# Patient Record
Sex: Female | Born: 1975 | Race: White | Hispanic: No | Marital: Single | Smoking: Former smoker
Health system: Southern US, Community
[De-identification: ages and names within clinical notes are randomized; demographics above are authoritative.]

## PROBLEM LIST (undated history)

## (undated) DIAGNOSIS — R112 Nausea with vomiting, unspecified: Secondary | ICD-10-CM

## (undated) DIAGNOSIS — E669 Obesity, unspecified: Secondary | ICD-10-CM

## (undated) DIAGNOSIS — Z9889 Other specified postprocedural states: Secondary | ICD-10-CM

## (undated) DIAGNOSIS — K219 Gastro-esophageal reflux disease without esophagitis: Secondary | ICD-10-CM

## (undated) DIAGNOSIS — E282 Polycystic ovarian syndrome: Secondary | ICD-10-CM

## (undated) DIAGNOSIS — M1990 Unspecified osteoarthritis, unspecified site: Secondary | ICD-10-CM

## (undated) DIAGNOSIS — C50919 Malignant neoplasm of unspecified site of unspecified female breast: Secondary | ICD-10-CM

## (undated) DIAGNOSIS — R569 Unspecified convulsions: Secondary | ICD-10-CM

## (undated) DIAGNOSIS — K649 Unspecified hemorrhoids: Secondary | ICD-10-CM

## (undated) DIAGNOSIS — G43909 Migraine, unspecified, not intractable, without status migrainosus: Secondary | ICD-10-CM

## (undated) DIAGNOSIS — C4492 Squamous cell carcinoma of skin, unspecified: Secondary | ICD-10-CM

## (undated) HISTORY — PX: COSMETIC SURGERY: SHX468

## (undated) HISTORY — PX: HEMORRHOID SURGERY: SHX153

## (undated) HISTORY — DX: Malignant neoplasm of unspecified site of unspecified female breast: C50.919

## (undated) HISTORY — PX: DILATION AND CURETTAGE OF UTERUS: SHX78

## (undated) HISTORY — DX: Squamous cell carcinoma of skin, unspecified: C44.92

---

## 2009-11-09 ENCOUNTER — Emergency Department (HOSPITAL_BASED_OUTPATIENT_CLINIC_OR_DEPARTMENT_OTHER): Admission: EM | Admit: 2009-11-09 | Discharge: 2009-11-09 | Payer: Self-pay | Admitting: Emergency Medicine

## 2010-05-21 ENCOUNTER — Emergency Department (HOSPITAL_BASED_OUTPATIENT_CLINIC_OR_DEPARTMENT_OTHER): Admission: EM | Admit: 2010-05-21 | Discharge: 2010-05-21 | Payer: Self-pay | Admitting: Emergency Medicine

## 2010-06-26 ENCOUNTER — Emergency Department (HOSPITAL_BASED_OUTPATIENT_CLINIC_OR_DEPARTMENT_OTHER)
Admission: EM | Admit: 2010-06-26 | Discharge: 2010-06-26 | Payer: Self-pay | Source: Home / Self Care | Admitting: Emergency Medicine

## 2010-07-23 ENCOUNTER — Emergency Department (HOSPITAL_BASED_OUTPATIENT_CLINIC_OR_DEPARTMENT_OTHER)
Admission: EM | Admit: 2010-07-23 | Discharge: 2010-07-23 | Payer: Self-pay | Source: Home / Self Care | Admitting: Emergency Medicine

## 2010-08-05 ENCOUNTER — Emergency Department (HOSPITAL_BASED_OUTPATIENT_CLINIC_OR_DEPARTMENT_OTHER)
Admission: EM | Admit: 2010-08-05 | Discharge: 2010-08-05 | Disposition: A | Discharge status: Home / Self Care | Payer: Medicaid Other | Attending: Emergency Medicine | Admitting: Emergency Medicine

## 2010-08-05 DIAGNOSIS — M545 Low back pain, unspecified: Secondary | ICD-10-CM | POA: Insufficient documentation

## 2010-08-05 DIAGNOSIS — F17200 Nicotine dependence, unspecified, uncomplicated: Secondary | ICD-10-CM | POA: Insufficient documentation

## 2010-08-05 LAB — URINE MICROSCOPIC-ADD ON

## 2010-08-05 LAB — URINALYSIS, ROUTINE W REFLEX MICROSCOPIC
Bilirubin Urine: NEGATIVE
Leukocytes, UA: NEGATIVE
Nitrite: NEGATIVE
Specific Gravity, Urine: 1.005 (ref 1.005–1.030)
Urine Glucose, Fasting: NEGATIVE mg/dL
pH: 6 (ref 5.0–8.0)

## 2010-08-05 LAB — PREGNANCY, URINE: Preg Test, Ur: NEGATIVE

## 2010-09-11 LAB — URINALYSIS, ROUTINE W REFLEX MICROSCOPIC
Bilirubin Urine: NEGATIVE
Glucose, UA: NEGATIVE mg/dL
Ketones, ur: NEGATIVE mg/dL
Protein, ur: NEGATIVE mg/dL
pH: 7.5 (ref 5.0–8.0)

## 2010-11-06 ENCOUNTER — Emergency Department (HOSPITAL_COMMUNITY)
Admission: EM | Admit: 2010-11-06 | Discharge: 2010-11-06 | Disposition: A | Discharge status: Home / Self Care | Payer: Medicaid Other | Attending: Emergency Medicine | Admitting: Emergency Medicine

## 2010-11-06 DIAGNOSIS — K0510 Chronic gingivitis, plaque induced: Secondary | ICD-10-CM | POA: Insufficient documentation

## 2010-11-06 DIAGNOSIS — K089 Disorder of teeth and supporting structures, unspecified: Secondary | ICD-10-CM | POA: Insufficient documentation

## 2011-01-03 ENCOUNTER — Emergency Department (HOSPITAL_BASED_OUTPATIENT_CLINIC_OR_DEPARTMENT_OTHER)
Admission: EM | Admit: 2011-01-03 | Discharge: 2011-01-03 | Disposition: A | Discharge status: Home / Self Care | Payer: Medicaid Other | Attending: Emergency Medicine | Admitting: Emergency Medicine

## 2011-01-03 ENCOUNTER — Encounter: Payer: Self-pay | Admitting: Emergency Medicine

## 2011-01-03 DIAGNOSIS — K029 Dental caries, unspecified: Secondary | ICD-10-CM | POA: Insufficient documentation

## 2011-01-03 DIAGNOSIS — F17200 Nicotine dependence, unspecified, uncomplicated: Secondary | ICD-10-CM | POA: Insufficient documentation

## 2011-01-03 HISTORY — DX: Unspecified osteoarthritis, unspecified site: M19.90

## 2011-01-03 MED ORDER — IBUPROFEN 600 MG PO TABS: 600.0000 mg | ORAL_TABLET | Freq: Four times a day (QID) | ORAL | Status: AC | PRN

## 2011-01-03 MED ORDER — HYDROCODONE-ACETAMINOPHEN 5-325 MG PO TABS: 2.0000 | ORAL_TABLET | ORAL | Status: AC | PRN

## 2011-01-03 MED ORDER — IBUPROFEN 800 MG PO TABS
800.0000 mg | ORAL_TABLET | Freq: Once | ORAL | Status: AC
  Administered 2011-01-03: 800 mg via ORAL
  Filled 2011-01-03: qty 1

## 2011-01-03 NOTE — ED Notes (Signed)
Pt c/o left lower tooth pain.

## 2011-01-03 NOTE — ED Provider Notes (Signed)
History     Chief Complaint  Patient presents with  . Dental Pain   Patient is a 35 y.o. female presenting with tooth pain. The history is provided by the patient.  Dental PainPrimary symptoms do not include mouth pain, dental injury, oral bleeding, fever or sore throat. The symptoms began 3 to 5 days ago. The symptoms are unchanged. The symptoms occur constantly.  Additional symptoms include: jaw pain. Additional symptoms do not include: gum swelling, facial swelling and trouble swallowing.    Past Medical History  Diagnosis Date  . Arthritis     No past surgical history on file.  No family history on file.  History  Substance Use Topics  . Smoking status: Current Everyday Smoker -- 10 years    Types: Cigarettes  . Smokeless tobacco: Not on file  . Alcohol Use: No    OB History    Grav Para Term Preterm Abortions TAB SAB Ect Mult Living                  Review of Systems  Constitutional: Negative for fever.  HENT: Negative for sore throat, facial swelling and trouble swallowing.   All other systems reviewed and are negative.    Physical Exam  BP 133/82  Pulse 76  Temp(Src) 99.3 F (37.4 C) (Oral)  Resp 18  SpO2 100%  Physical Exam  Nursing note and vitals reviewed. Constitutional: She is oriented to person, place, and time. She appears well-developed and well-nourished.  Non-toxic appearance.  HENT:  Head: Normocephalic and atraumatic.  Mouth/Throat: Abnormal dentition. Dental caries present.  Eyes: Conjunctivae are normal. Pupils are equal, round, and reactive to light.  Neck: Normal range of motion.  Cardiovascular: Normal rate.   Pulmonary/Chest: Effort normal.  Neurological: She is alert and oriented to person, place, and time.  Skin: Skin is warm and dry.  Psychiatric: She has a normal mood and affect.    ED Course  Procedures  MDM Pt given ibuprofen for pain, will f/u dentist      Toy Baker, MD 01/03/11 2243

## 2011-01-22 ENCOUNTER — Emergency Department (HOSPITAL_COMMUNITY)
Admission: EM | Admit: 2011-01-22 | Discharge: 2011-01-22 | Disposition: A | Discharge status: Home / Self Care | Payer: Medicaid Other | Attending: Emergency Medicine | Admitting: Emergency Medicine

## 2011-01-22 ENCOUNTER — Emergency Department (HOSPITAL_COMMUNITY): Payer: Medicaid Other

## 2011-01-22 DIAGNOSIS — M546 Pain in thoracic spine: Secondary | ICD-10-CM | POA: Insufficient documentation

## 2011-01-22 DIAGNOSIS — S20229A Contusion of unspecified back wall of thorax, initial encounter: Secondary | ICD-10-CM | POA: Insufficient documentation

## 2011-01-22 DIAGNOSIS — M545 Low back pain, unspecified: Secondary | ICD-10-CM | POA: Insufficient documentation

## 2011-01-22 DIAGNOSIS — M129 Arthropathy, unspecified: Secondary | ICD-10-CM | POA: Insufficient documentation

## 2011-01-22 DIAGNOSIS — E669 Obesity, unspecified: Secondary | ICD-10-CM | POA: Insufficient documentation

## 2011-01-22 DIAGNOSIS — IMO0002 Reserved for concepts with insufficient information to code with codable children: Secondary | ICD-10-CM | POA: Insufficient documentation

## 2011-01-22 DIAGNOSIS — W07XXXA Fall from chair, initial encounter: Secondary | ICD-10-CM | POA: Insufficient documentation

## 2011-04-10 ENCOUNTER — Emergency Department (HOSPITAL_COMMUNITY)
Admission: EM | Admit: 2011-04-10 | Discharge: 2011-04-11 | Disposition: A | Discharge status: Home / Self Care | Payer: Medicaid Other | Attending: Emergency Medicine | Admitting: Emergency Medicine

## 2011-04-10 ENCOUNTER — Emergency Department (HOSPITAL_COMMUNITY)
Admission: EM | Admit: 2011-04-10 | Discharge: 2011-04-10 | Discharge status: Against Medical Advice | Payer: Medicaid Other | Attending: Emergency Medicine | Admitting: Emergency Medicine

## 2011-04-10 DIAGNOSIS — R599 Enlarged lymph nodes, unspecified: Secondary | ICD-10-CM | POA: Insufficient documentation

## 2011-04-10 DIAGNOSIS — R51 Headache: Secondary | ICD-10-CM | POA: Insufficient documentation

## 2011-04-11 ENCOUNTER — Emergency Department (HOSPITAL_COMMUNITY): Payer: Medicaid Other

## 2011-04-11 ENCOUNTER — Encounter (HOSPITAL_COMMUNITY): Payer: Self-pay

## 2011-04-11 LAB — URINALYSIS, ROUTINE W REFLEX MICROSCOPIC
Bilirubin Urine: NEGATIVE
Hgb urine dipstick: NEGATIVE
Specific Gravity, Urine: 1.01 (ref 1.005–1.030)
Urobilinogen, UA: 0.2 mg/dL (ref 0.0–1.0)

## 2011-04-11 LAB — CBC
HCT: 41.9 % (ref 36.0–46.0)
MCH: 28.4 pg (ref 26.0–34.0)
MCHC: 33.4 g/dL (ref 30.0–36.0)
MCV: 85 fL (ref 78.0–100.0)
RDW: 13.7 % (ref 11.5–15.5)
WBC: 10.2 10*3/uL (ref 4.0–10.5)

## 2011-04-11 LAB — BASIC METABOLIC PANEL
BUN: 8 mg/dL (ref 6–23)
Calcium: 9.2 mg/dL (ref 8.4–10.5)
Chloride: 104 mEq/L (ref 96–112)
Creatinine, Ser: 0.76 mg/dL (ref 0.50–1.10)
GFR calc Af Amer: >90 mL/min (ref >90)

## 2011-04-11 LAB — POCT I-STAT, CHEM 8
BUN: 8 mg/dL (ref 6–23)
Calcium, Ion: 1.16 mmol/L (ref 1.12–1.32)
Chloride: 106 mEq/L (ref 96–112)
Creatinine, Ser: 0.9 mg/dL (ref 0.50–1.10)
Glucose, Bld: 103 mg/dL — ABNORMAL HIGH (ref 70–99)

## 2011-04-11 LAB — DIFFERENTIAL
Basophils Relative: 0 % (ref 0–1)
Lymphocytes Relative: 47 % — ABNORMAL HIGH (ref 12–46)
Monocytes Absolute: 0.7 10*3/uL (ref 0.1–1.0)
Monocytes Relative: 6 % (ref 3–12)
Neutro Abs: 4.3 10*3/uL (ref 1.7–7.7)

## 2011-04-23 ENCOUNTER — Emergency Department (HOSPITAL_COMMUNITY)
Admission: EM | Admit: 2011-04-23 | Discharge: 2011-04-23 | Disposition: A | Discharge status: Home / Self Care | Payer: Medicaid Other | Attending: Emergency Medicine | Admitting: Emergency Medicine

## 2011-04-23 DIAGNOSIS — K5289 Other specified noninfective gastroenteritis and colitis: Secondary | ICD-10-CM | POA: Insufficient documentation

## 2011-04-23 DIAGNOSIS — R10819 Abdominal tenderness, unspecified site: Secondary | ICD-10-CM | POA: Insufficient documentation

## 2011-04-23 DIAGNOSIS — E860 Dehydration: Secondary | ICD-10-CM | POA: Insufficient documentation

## 2011-04-23 DIAGNOSIS — R112 Nausea with vomiting, unspecified: Secondary | ICD-10-CM | POA: Insufficient documentation

## 2011-04-23 DIAGNOSIS — R51 Headache: Secondary | ICD-10-CM | POA: Insufficient documentation

## 2011-04-23 DIAGNOSIS — R509 Fever, unspecified: Secondary | ICD-10-CM | POA: Insufficient documentation

## 2011-04-23 DIAGNOSIS — R197 Diarrhea, unspecified: Secondary | ICD-10-CM | POA: Insufficient documentation

## 2011-04-23 LAB — URINALYSIS, ROUTINE W REFLEX MICROSCOPIC
Glucose, UA: NEGATIVE mg/dL
Hgb urine dipstick: NEGATIVE
Ketones, ur: NEGATIVE mg/dL
Protein, ur: NEGATIVE mg/dL

## 2011-04-23 LAB — HEPATIC FUNCTION PANEL
ALT: 15 U/L (ref 0–35)
Alkaline Phosphatase: 87 U/L (ref 39–117)
Bilirubin, Direct: <0.1 mg/dL (ref 0.0–0.3)

## 2011-04-23 LAB — BASIC METABOLIC PANEL
CO2: 20 mEq/L (ref 19–32)
Calcium: 8.6 mg/dL (ref 8.4–10.5)
Creatinine, Ser: 0.68 mg/dL (ref 0.50–1.10)

## 2011-04-23 LAB — CBC
HCT: 39.6 % (ref 36.0–46.0)
MCH: 28.9 pg (ref 26.0–34.0)
MCHC: 34.3 g/dL (ref 30.0–36.0)
MCV: 84.3 fL (ref 78.0–100.0)
RDW: 13.9 % (ref 11.5–15.5)

## 2011-04-23 LAB — DIFFERENTIAL
Eosinophils Relative: 0 % (ref 0–5)
Lymphocytes Relative: 7 % — ABNORMAL LOW (ref 12–46)
Lymphs Abs: 0.9 10*3/uL (ref 0.7–4.0)
Monocytes Absolute: 0.6 10*3/uL (ref 0.1–1.0)

## 2011-05-09 ENCOUNTER — Encounter (HOSPITAL_COMMUNITY): Payer: Self-pay | Admitting: Emergency Medicine

## 2011-05-09 ENCOUNTER — Emergency Department (HOSPITAL_COMMUNITY): Payer: Medicaid Other

## 2011-05-09 ENCOUNTER — Emergency Department (HOSPITAL_COMMUNITY)
Admission: EM | Admit: 2011-05-09 | Discharge: 2011-05-09 | Disposition: A | Discharge status: Home / Self Care | Payer: Medicaid Other | Attending: Emergency Medicine | Admitting: Emergency Medicine

## 2011-05-09 DIAGNOSIS — G43909 Migraine, unspecified, not intractable, without status migrainosus: Secondary | ICD-10-CM | POA: Insufficient documentation

## 2011-05-09 DIAGNOSIS — N949 Unspecified condition associated with female genital organs and menstrual cycle: Secondary | ICD-10-CM | POA: Insufficient documentation

## 2011-05-09 DIAGNOSIS — R110 Nausea: Secondary | ICD-10-CM | POA: Insufficient documentation

## 2011-05-09 DIAGNOSIS — M129 Arthropathy, unspecified: Secondary | ICD-10-CM | POA: Insufficient documentation

## 2011-05-09 DIAGNOSIS — R109 Unspecified abdominal pain: Secondary | ICD-10-CM | POA: Insufficient documentation

## 2011-05-09 DIAGNOSIS — H53149 Visual discomfort, unspecified: Secondary | ICD-10-CM | POA: Insufficient documentation

## 2011-05-09 DIAGNOSIS — N898 Other specified noninflammatory disorders of vagina: Secondary | ICD-10-CM | POA: Insufficient documentation

## 2011-05-09 DIAGNOSIS — R102 Pelvic and perineal pain: Secondary | ICD-10-CM

## 2011-05-09 HISTORY — DX: Migraine, unspecified, not intractable, without status migrainosus: G43.909

## 2011-05-09 LAB — CBC
MCH: 28.3 pg (ref 26.0–34.0)
MCHC: 33.4 g/dL (ref 30.0–36.0)
MCV: 84.7 fL (ref 78.0–100.0)
Platelets: 294 10*3/uL (ref 150–400)
RDW: 13.7 % (ref 11.5–15.5)

## 2011-05-09 LAB — URINALYSIS, ROUTINE W REFLEX MICROSCOPIC
Hgb urine dipstick: NEGATIVE
Nitrite: NEGATIVE
Specific Gravity, Urine: 1.023 (ref 1.005–1.030)
Urobilinogen, UA: 1 mg/dL (ref 0.0–1.0)
pH: 5.5 (ref 5.0–8.0)

## 2011-05-09 LAB — DIFFERENTIAL
Basophils Relative: 0 % (ref 0–1)
Eosinophils Absolute: 0.2 10*3/uL (ref 0.0–0.7)
Eosinophils Relative: 3 % (ref 0–5)
Lymphs Abs: 3.5 10*3/uL (ref 0.7–4.0)
Neutrophils Relative %: 55 % (ref 43–77)

## 2011-05-09 LAB — COMPREHENSIVE METABOLIC PANEL
ALT: 14 U/L (ref 0–35)
Albumin: 3.6 g/dL (ref 3.5–5.2)
Alkaline Phosphatase: 103 U/L (ref 39–117)
Calcium: 9.3 mg/dL (ref 8.4–10.5)
GFR calc Af Amer: >90 mL/min (ref >90)
Glucose, Bld: 88 mg/dL (ref 70–99)
Potassium: 3.8 mEq/L (ref 3.5–5.1)
Sodium: 136 mEq/L (ref 135–145)
Total Protein: 7.2 g/dL (ref 6.0–8.3)

## 2011-05-09 LAB — LIPASE, BLOOD: Lipase: 25 U/L (ref 11–59)

## 2011-05-09 LAB — WET PREP, GENITAL: Yeast Wet Prep HPF POC: NONE SEEN

## 2011-05-09 MED ORDER — LIDOCAINE HCL (PF) 1 % IJ SOLN
INTRAMUSCULAR | Status: AC
  Administered 2011-05-09: 5 mL
  Filled 2011-05-09: qty 5

## 2011-05-09 MED ORDER — DOXYCYCLINE HYCLATE 100 MG PO TABS
100.0000 mg | ORAL_TABLET | Freq: Once | ORAL | Status: AC
  Administered 2011-05-09: 100 mg via ORAL
  Filled 2011-05-09: qty 1

## 2011-05-09 MED ORDER — HYDROCODONE-ACETAMINOPHEN 5-500 MG PO TABS: 1.0000 | ORAL_TABLET | Freq: Four times a day (QID) | ORAL | Status: AC | PRN

## 2011-05-09 MED ORDER — CEFTRIAXONE SODIUM 250 MG IJ SOLR
250.0000 mg | Freq: Once | INTRAMUSCULAR | Status: AC
  Administered 2011-05-09: 250 mg via INTRAMUSCULAR
  Filled 2011-05-09: qty 250

## 2011-05-09 MED ORDER — METOCLOPRAMIDE HCL 5 MG/ML IJ SOLN
10.0000 mg | Freq: Once | INTRAMUSCULAR | Status: AC
  Administered 2011-05-09: 10 mg via INTRAVENOUS
  Filled 2011-05-09: qty 2

## 2011-05-09 MED ORDER — KETOROLAC TROMETHAMINE 30 MG/ML IJ SOLN
30.0000 mg | Freq: Once | INTRAMUSCULAR | Status: AC
  Administered 2011-05-09: 30 mg via INTRAVENOUS
  Filled 2011-05-09: qty 1

## 2011-05-09 MED ORDER — METRONIDAZOLE 500 MG PO TABS
2000.0000 mg | ORAL_TABLET | Freq: Once | ORAL | Status: AC
  Administered 2011-05-09: 2000 mg via ORAL
  Filled 2011-05-09: qty 4

## 2011-05-09 MED ORDER — DIPHENHYDRAMINE HCL 50 MG/ML IJ SOLN
25.0000 mg | Freq: Once | INTRAMUSCULAR | Status: AC
  Administered 2011-05-09: 25 mg via INTRAVENOUS
  Filled 2011-05-09: qty 1

## 2011-05-09 MED ORDER — SODIUM CHLORIDE 0.9 % IV BOLUS (SEPSIS)
1000.0000 mL | Freq: Once | INTRAVENOUS | Status: AC
  Administered 2011-05-09: 1000 mL via INTRAVENOUS

## 2011-05-09 MED ORDER — DOXYCYCLINE HYCLATE 100 MG PO CAPS: 100.0000 mg | ORAL_CAPSULE | Freq: Two times a day (BID) | ORAL | Status: AC

## 2011-05-09 NOTE — ED Notes (Signed)
Migraine, and headache woke up this morning, now with lower abdominal pain, sweating and cold at the same time

## 2011-05-10 LAB — GC/CHLAMYDIA PROBE AMP, GENITAL
Chlamydia, DNA Probe: NEGATIVE
GC Probe Amp, Genital: NEGATIVE

## 2011-05-10 NOTE — ED Provider Notes (Signed)
History     CSN: [id number] Arrival date & time: 05/09/2011  1:42 PM   First MD Initiated Contact with Patient 05/09/11 1721      Chief Complaint  Patient presents with  . Migraine  . Emesis  . Abdominal Cramping    (Consider location/radiation/quality/duration/timing/severity/associated sxs/prior treatment) HPI Comments: Also presents with pelvic pain and cramping which has been present for same period of time.  Vag dc and states that her period was irreg last week.  No concern for pregnancy.  No vag bleeding currently.  Nausea but no vomiting.  No neuro sx.  No change in stool.  No urinary sx or fever  Patient is a 35 y.o. female presenting with migraine. The history is provided by the patient. No language interpreter was used.  Migraine This is a new problem. The current episode started 6 to 12 hours ago. The problem occurs constantly. The problem has been gradually worsening. Pertinent negatives include no chest pain. Exacerbated by: light and sound. The symptoms are relieved by nothing. She has tried nothing for the symptoms.    Past Medical History  Diagnosis Date  . Arthritis   . Migraines     Past Surgical History  Procedure Date  . Dilation and curettage of uterus     No family history on file.  History  Substance Use Topics  . Smoking status: Current Everyday Smoker -- 4.0 packs/day for 10 years    Types: Cigarettes  . Smokeless tobacco: Not on file  . Alcohol Use: Not on file    OB History    Grav Para Term Preterm Abortions TAB SAB Ect Mult Living                  Review of Systems  Constitutional: Negative for activity change and appetite change.  HENT: Negative for congestion, sore throat, rhinorrhea, neck pain and neck stiffness.   Eyes: Positive for photophobia. Negative for pain and visual disturbance.  Respiratory: Negative for chest tightness.   Cardiovascular: Negative for chest pain and palpitations.  Genitourinary: Positive for pelvic  pain. Negative for flank pain.  Neurological: Negative for syncope, light-headedness and numbness.  All other systems reviewed and are negative.    Allergies  Review of patient's allergies indicates no known allergies.  Home Medications   Current Outpatient Rx  Name Route Sig Dispense Refill  . DOXYCYCLINE HYCLATE 100 MG PO CAPS Oral Take 1 capsule (100 mg total) by mouth 2 (two) times daily. 28 capsule 0  . HYDROCODONE-ACETAMINOPHEN 5-500 MG PO TABS Oral Take 1-2 tablets by mouth every 6 (six) hours as needed for pain. 15 tablet 0    BP 136/98  Pulse 85  Temp(Src) 97.4 F (36.3 C) (Oral)  Resp 16  Ht 5\' 11"  (1.803 m)  Wt 250 lb (113.399 kg)  BMI 34.87 kg/m2  SpO2 99%  LMP 05/02/2011  Physical Exam  Nursing note and vitals reviewed. Constitutional: She is oriented to person, place, and time. She appears well-developed and well-nourished. No distress.  HENT:  Head: Normocephalic and atraumatic.  Mouth/Throat: Oropharynx is clear and moist.  Eyes: Conjunctivae and EOM are normal. Pupils are equal, round, and reactive to light.       Photophobia bilaterally  Neck: Normal range of motion. Neck supple.  Cardiovascular: Normal rate, regular rhythm, normal heart sounds and intact distal pulses.   Pulmonary/Chest: Effort normal and breath sounds normal. No respiratory distress.  Abdominal: Soft. Bowel sounds are normal. There is tenderness (lower  abd and pelvic pain diffusely). There is no rebound and no guarding.  Genitourinary: Cervix exhibits motion tenderness and discharge. Vaginal discharge found.  Neurological: She is alert and oriented to person, place, and time. No cranial nerve deficit.  Skin: Skin is warm and dry. No rash noted.    ED Course  Procedures (including critical care time)  Labs Reviewed  COMPREHENSIVE METABOLIC PANEL - Abnormal; Notable for the following:    Total Bilirubin 0.2 (*)    All other components within normal limits  WET PREP, GENITAL -  Abnormal; Notable for the following:    Clue Cells, Wet Prep FEW (*)    WBC, Wet Prep HPF POC FEW (*)    All other components within normal limits  CBC  DIFFERENTIAL  LIPASE, BLOOD  URINALYSIS, ROUTINE W REFLEX MICROSCOPIC  POCT PREGNANCY, URINE  GC/CHLAMYDIA PROBE AMP, GENITAL   US Transvaginal Non-ob  05/09/2011  *RADIOLOGY REPORT*  Clinical Data: Right adnexal pain.  TRANSABDOMINAL AND TRANSVAGINAL ULTRASOUND OF PELVIS Technique:  Both transabdominal and transvaginal ultrasound examinations of the pelvis were performed. Transabdominal technique was performed for global imaging of the pelvis including uterus, ovaries, adnexal regions, and pelvic cul-de-sac.  Comparison: None.   It was necessary to proceed with endovaginal exam following the transabdominal exam to visualize the adnexa.  Findings:  Uterus: There is some fluid within the cervix.  The cervical canal is open at 2.7 mm.  The uterus is otherwise unremarkable.  It measures 8.4 x 3.4 x 4.4 cm.  Endometrium: Normal in thickness and appearance, measuring 4.8 mm maximally.  Right ovary:  Normal size and echotexture measuring 3.6 x 3.1 x 2.4 cm.  Color flow is demonstrated.  Left ovary: Normal size and echotexture measuring 4.0 x 3.0 x 2.6 cm.  Color flow is demonstrated.  Other findings: No free fluid  IMPRESSION: A small amount of fluid is present within the cervical canal.  The study is otherwise unremarkable.  Original Report Authenticated By: Jamesetta Orleans. MATTERN, M.D.   US Pelvis Complete  05/09/2011  *RADIOLOGY REPORT*  Clinical Data: Right adnexal pain.  TRANSABDOMINAL AND TRANSVAGINAL ULTRASOUND OF PELVIS Technique:  Both transabdominal and transvaginal ultrasound examinations of the pelvis were performed. Transabdominal technique was performed for global imaging of the pelvis including uterus, ovaries, adnexal regions, and pelvic cul-de-sac.  Comparison: None.   It was necessary to proceed with endovaginal exam following the  transabdominal exam to visualize the adnexa.  Findings:  Uterus: There is some fluid within the cervix.  The cervical canal is open at 2.7 mm.  The uterus is otherwise unremarkable.  It measures 8.4 x 3.4 x 4.4 cm.  Endometrium: Normal in thickness and appearance, measuring 4.8 mm maximally.  Right ovary:  Normal size and echotexture measuring 3.6 x 3.1 x 2.4 cm.  Color flow is demonstrated.  Left ovary: Normal size and echotexture measuring 4.0 x 3.0 x 2.6 cm.  Color flow is demonstrated.  Other findings: No free fluid  IMPRESSION: A small amount of fluid is present within the cervical canal.  The study is otherwise unremarkable.  Original Report Authenticated By: Jamesetta Orleans. MATTERN, M.D.     1. Migraine   2. Pelvic pain in female       MDM  HA consistent with migraine as has prior hx.  No concern for [facility] or additional cause of malignant HA as gradual in onset, consistent with prior ha and has no neuro sx.  Pelvic pain evaluated and given adnexal tenderness  US performed and unremarkable.  Will treat for pid empirically given CMT.  Urine neg.  Will dc home with doxy and pain medication and instructions to follow up with pcp.  Received ceftriaxone and flagyl in ed.  Provided sx for which to return to ed        Dayton Bailiff, MD 05/10/11 [phone]

## 2011-07-14 ENCOUNTER — Encounter (HOSPITAL_BASED_OUTPATIENT_CLINIC_OR_DEPARTMENT_OTHER): Payer: Self-pay | Admitting: *Deleted

## 2011-07-14 ENCOUNTER — Emergency Department (HOSPITAL_BASED_OUTPATIENT_CLINIC_OR_DEPARTMENT_OTHER)
Admission: EM | Admit: 2011-07-14 | Discharge: 2011-07-14 | Disposition: A | Discharge status: Home / Self Care | Payer: Medicaid Other | Attending: Emergency Medicine | Admitting: Emergency Medicine

## 2011-07-14 DIAGNOSIS — Z8739 Personal history of other diseases of the musculoskeletal system and connective tissue: Secondary | ICD-10-CM | POA: Insufficient documentation

## 2011-07-14 DIAGNOSIS — K089 Disorder of teeth and supporting structures, unspecified: Secondary | ICD-10-CM | POA: Insufficient documentation

## 2011-07-14 DIAGNOSIS — F17200 Nicotine dependence, unspecified, uncomplicated: Secondary | ICD-10-CM | POA: Insufficient documentation

## 2011-07-14 DIAGNOSIS — K0889 Other specified disorders of teeth and supporting structures: Secondary | ICD-10-CM

## 2011-07-14 MED ORDER — PENICILLIN V POTASSIUM 500 MG PO TABS: 500.0000 mg | ORAL_TABLET | Freq: Four times a day (QID) | ORAL | Status: AC

## 2011-07-14 MED ORDER — HYDROCODONE-ACETAMINOPHEN 5-325 MG PO TABS: 2.0000 | ORAL_TABLET | ORAL | Status: AC | PRN

## 2011-07-14 NOTE — ED Notes (Signed)
Pt states she started having dental pain last night. Bottom, lower right. Falling asleep between questions at triage, but states she has been working since [employer]. Speech somewhat slurred. Has taken Tylenol without relief.

## 2011-07-15 NOTE — ED Provider Notes (Signed)
History     CSN: [id number]  Arrival date & time 07/14/11  1722   First MD Initiated Contact with Patient 07/14/11 2002      Chief Complaint  Patient presents with  . Dental Pain    (Consider location/radiation/quality/duration/timing/severity/associated sxs/prior treatment) Patient is a 36 y.o. female presenting with tooth pain. The history is provided by the patient. No language interpreter was used.  Dental PainThe primary symptoms include mouth pain. The symptoms began yesterday. The symptoms are worsening. The symptoms are new. The symptoms occur constantly.  Additional symptoms include: gum tenderness. Medical issues do not include: alcohol problem.  Pt complains of pain in mouth.  Past Medical History  Diagnosis Date  . Arthritis   . Migraines     Past Surgical History  Procedure Date  . Dilation and curettage of uterus     History reviewed. No pertinent family history.  History  Substance Use Topics  . Smoking status: Current Everyday Smoker -- 4.0 packs/day for 10 years    Types: Cigarettes  . Smokeless tobacco: Not on file  . Alcohol Use: No    OB History    Grav Para Term Preterm Abortions TAB SAB Ect Mult Living                  Review of Systems  HENT: Positive for dental problem.   All other systems reviewed and are negative.    Allergies  Review of patient's allergies indicates no known allergies.  Home Medications   Current Outpatient Rx  Name Route Sig Dispense Refill  . ACETAMINOPHEN 500 MG PO TABS Oral Take 1,000-2,000 mg by mouth every 6 (six) hours as needed. For pain    . HYDROCODONE-ACETAMINOPHEN 5-325 MG PO TABS Oral Take 2 tablets by mouth every 4 (four) hours as needed for pain. 16 tablet 0  . PENICILLIN V POTASSIUM 500 MG PO TABS Oral Take 1 tablet (500 mg total) by mouth 4 (four) times daily. 40 tablet 0    BP 128/83  Pulse 92  Temp(Src) 98.3 F (36.8 C) (Oral)  Resp 16  Ht 5\' 11"  (1.803 m)  Wt 300 lb (136.079 kg)   BMI 41.84 kg/m2  SpO2 100%  LMP 06/20/2011  Physical Exam  Nursing note and vitals reviewed. Constitutional: She appears well-developed and well-nourished.  HENT:  Head: Normocephalic and atraumatic.       Swelling lower right mouth  Eyes: Conjunctivae and EOM are normal. Pupils are equal, round, and reactive to light.  Neck: Normal range of motion. Neck supple.  Cardiovascular: Normal rate.   Pulmonary/Chest: Effort normal.  Abdominal: Soft.  Musculoskeletal: Normal range of motion.  Neurological: She is alert.  Skin: Skin is warm.  Psychiatric: She has a normal mood and affect.    ED Course  Procedures (including critical care time)  Labs Reviewed - No data to display No results found.   1. Tooth pain       MDM  Pt given rx for hydrocodone and pcn       Langston Masker, [place] 07/18/11 1204

## 2011-07-18 NOTE — ED Provider Notes (Signed)
Medical screening examination/treatment/procedure(s) were performed by non-physician practitioner and as supervising physician I was immediately available for consultation/collaboration.  Samya Siciliano M Serigne Kubicek, MD 07/18/11 1920 

## 2014-05-09 ENCOUNTER — Encounter (HOSPITAL_BASED_OUTPATIENT_CLINIC_OR_DEPARTMENT_OTHER): Payer: Self-pay | Admitting: Emergency Medicine

## 2014-05-09 ENCOUNTER — Emergency Department (HOSPITAL_BASED_OUTPATIENT_CLINIC_OR_DEPARTMENT_OTHER)
Admission: EM | Admit: 2014-05-09 | Discharge: 2014-05-09 | Disposition: A | Discharge status: Home / Self Care | Payer: Medicaid Other | Attending: Emergency Medicine | Admitting: Emergency Medicine

## 2014-05-09 ENCOUNTER — Emergency Department (HOSPITAL_BASED_OUTPATIENT_CLINIC_OR_DEPARTMENT_OTHER): Payer: Medicaid Other

## 2014-05-09 DIAGNOSIS — Y939 Activity, unspecified: Secondary | ICD-10-CM | POA: Insufficient documentation

## 2014-05-09 DIAGNOSIS — W1839XA Other fall on same level, initial encounter: Secondary | ICD-10-CM | POA: Diagnosis not present

## 2014-05-09 DIAGNOSIS — Z720 Tobacco use: Secondary | ICD-10-CM | POA: Insufficient documentation

## 2014-05-09 DIAGNOSIS — M1990 Unspecified osteoarthritis, unspecified site: Secondary | ICD-10-CM | POA: Diagnosis not present

## 2014-05-09 DIAGNOSIS — Y929 Unspecified place or not applicable: Secondary | ICD-10-CM | POA: Insufficient documentation

## 2014-05-09 DIAGNOSIS — Z79899 Other long term (current) drug therapy: Secondary | ICD-10-CM | POA: Insufficient documentation

## 2014-05-09 DIAGNOSIS — Y998 Other external cause status: Secondary | ICD-10-CM | POA: Diagnosis not present

## 2014-05-09 DIAGNOSIS — Z8639 Personal history of other endocrine, nutritional and metabolic disease: Secondary | ICD-10-CM | POA: Diagnosis not present

## 2014-05-09 DIAGNOSIS — W19XXXA Unspecified fall, initial encounter: Secondary | ICD-10-CM

## 2014-05-09 DIAGNOSIS — S99922A Unspecified injury of left foot, initial encounter: Secondary | ICD-10-CM | POA: Diagnosis not present

## 2014-05-09 MED ORDER — MELOXICAM 15 MG PO TABS: 15.0000 mg | ORAL_TABLET | Freq: Every day | ORAL | Status: DC

## 2014-05-09 NOTE — ED Notes (Signed)
Pt presents to ED with complaints of left foot pain. Pt reports she fell Monday.

## 2014-05-09 NOTE — ED Provider Notes (Signed)
CSN: [id number]     Arrival date & time 05/09/14  1458 History   First MD Initiated Contact with Patient 05/09/14 1544     Chief Complaint  Patient presents with  . Foot Pain     (Consider location/radiation/quality/duration/timing/severity/associated sxs/prior Treatment) Patient is a 38 y.o. female presenting with lower extremity pain. The history is provided by the patient. No language interpreter was used.  Foot Pain This is a new problem. The current episode started in the past 7 days. The problem occurs constantly. The problem has been unchanged. Associated symptoms include arthralgias. Pertinent negatives include no abdominal pain, chest pain, chills, fatigue, fever, nausea, neck pain, vomiting or weakness. The symptoms are aggravated by walking and standing. She has tried nothing for the symptoms. The treatment provided no relief.    Past Medical History  Diagnosis Date  . Arthritis   . Migraines    Past Surgical History  Procedure Laterality Date  . Dilation and curettage of uterus     No family history on file. History  Substance Use Topics  . Smoking status: Current Every Day Smoker -- 4.00 packs/day for 10 years    Types: Cigarettes  . Smokeless tobacco: Not on file  . Alcohol Use: No   OB History    No data available     Review of Systems  Constitutional: Negative for fever, chills and fatigue.  HENT: Negative for trouble swallowing.   Eyes: Negative for visual disturbance.  Respiratory: Negative for shortness of breath.   Cardiovascular: Negative for chest pain and palpitations.  Gastrointestinal: Negative for nausea, vomiting, abdominal pain and diarrhea.  Genitourinary: Negative for dysuria and difficulty urinating.  Musculoskeletal: Positive for arthralgias. Negative for neck pain.  Skin: Negative for color change.  Neurological: Negative for dizziness and weakness.  Psychiatric/Behavioral: Negative for dysphoric mood.      Allergies  Review of  patient's allergies indicates no known allergies.  Home Medications   Prior to Admission medications   Medication Sig Start Date End Date Taking? Authorizing Provider  omeprazole (PRILOSEC) 40 MG capsule Take 40 mg by mouth daily.   Yes Historical Provider, MD  acetaminophen (TYLENOL) 500 MG tablet Take 1,000-2,000 mg by mouth every 6 (six) hours as needed. For pain    Historical Provider, MD   BP 139/79 mmHg  Pulse 82  Temp(Src) 97.8 F (36.6 C) (Oral)  Resp 20  Ht 5\' 11"  (1.803 m)  Wt 280 lb (127.007 kg)  BMI 39.07 kg/m2  SpO2 99%  LMP 05/02/2014 Physical Exam  Constitutional: She is oriented to person, place, and time. She appears well-developed and well-nourished. No distress.  HENT:  Head: Normocephalic and atraumatic.  Eyes: Conjunctivae are normal.  Neck: Normal range of motion.  Cardiovascular: Normal rate and regular rhythm.  Exam reveals no gallop and no friction rub.   No murmur heard. Pulmonary/Chest: Effort normal and breath sounds normal. She has no wheezes. She has no rales. She exhibits no tenderness.  Abdominal: Soft. There is no tenderness.  Musculoskeletal: Normal range of motion.  Left dorsal foot tenderness to palpation over proximal 3rd and 4th metatarsals. No obvious deformity.   Neurological: She is alert and oriented to person, place, and time. Coordination normal.  Speech is goal-oriented. Moves limbs without ataxia.   Skin: Skin is warm and dry.  Psychiatric: She has a normal mood and affect. Her behavior is normal.  Nursing note and vitals reviewed.   ED Course  Procedures (including critical care time) Labs  Review Labs Reviewed - No data to display  Imaging Review Dg Foot Complete Left  05/09/2014   CLINICAL DATA:  Fall down steps 1 week ago. Pain and swelling across the metatarsals. Initial encounter.  EXAM: LEFT FOOT - COMPLETE 3+ VIEW  COMPARISON:  09/17/2006  FINDINGS: No acute fracture or dislocation is identified. There is mild soft  tissue swelling along the dorsum of the foot. Small to moderate sized plantar calcaneal enthesophyte is noted. Joint spaces are preserved. No lytic or blastic osseous lesion is seen. No radiopaque foreign body.  IMPRESSION: Soft tissue swelling without acute osseous abnormality identified.   Electronically Signed   By: Logan Bores   On: 05/09/2014 16:25     EKG Interpretation None      MDM   Final diagnoses:  Foot injury, left, initial encounter    4:38 PM Patient's xray shows no acute fracture. No other injury. Vitals stable and patient afebrile. Patient will have mobic for pain. No neurovascular compromise.     Alvina Chou, PA-C 05/09/14 [place], MD 05/09/14 2208

## 2014-05-09 NOTE — Discharge Instructions (Signed)
Take mobic as needed for pain. Rest, ice, and elevate your foot for pain relief.

## 2015-05-29 ENCOUNTER — Emergency Department (HOSPITAL_BASED_OUTPATIENT_CLINIC_OR_DEPARTMENT_OTHER): Payer: Medicaid Other

## 2015-05-29 ENCOUNTER — Encounter (HOSPITAL_BASED_OUTPATIENT_CLINIC_OR_DEPARTMENT_OTHER): Payer: Self-pay | Admitting: Emergency Medicine

## 2015-05-29 ENCOUNTER — Emergency Department (HOSPITAL_BASED_OUTPATIENT_CLINIC_OR_DEPARTMENT_OTHER)
Admission: EM | Admit: 2015-05-29 | Discharge: 2015-05-29 | Disposition: A | Discharge status: Home / Self Care | Payer: Medicaid Other | Attending: Emergency Medicine | Admitting: Emergency Medicine

## 2015-05-29 DIAGNOSIS — Z3202 Encounter for pregnancy test, result negative: Secondary | ICD-10-CM | POA: Diagnosis not present

## 2015-05-29 DIAGNOSIS — M1990 Unspecified osteoarthritis, unspecified site: Secondary | ICD-10-CM | POA: Insufficient documentation

## 2015-05-29 DIAGNOSIS — Z8679 Personal history of other diseases of the circulatory system: Secondary | ICD-10-CM | POA: Diagnosis not present

## 2015-05-29 DIAGNOSIS — Z79899 Other long term (current) drug therapy: Secondary | ICD-10-CM | POA: Insufficient documentation

## 2015-05-29 DIAGNOSIS — Z791 Long term (current) use of non-steroidal anti-inflammatories (NSAID): Secondary | ICD-10-CM | POA: Diagnosis not present

## 2015-05-29 DIAGNOSIS — R1012 Left upper quadrant pain: Secondary | ICD-10-CM | POA: Diagnosis not present

## 2015-05-29 DIAGNOSIS — F17210 Nicotine dependence, cigarettes, uncomplicated: Secondary | ICD-10-CM | POA: Insufficient documentation

## 2015-05-29 DIAGNOSIS — R1032 Left lower quadrant pain: Secondary | ICD-10-CM | POA: Insufficient documentation

## 2015-05-29 DIAGNOSIS — Z9889 Other specified postprocedural states: Secondary | ICD-10-CM | POA: Diagnosis not present

## 2015-05-29 DIAGNOSIS — R109 Unspecified abdominal pain: Secondary | ICD-10-CM | POA: Diagnosis present

## 2015-05-29 LAB — CBC WITH DIFFERENTIAL/PLATELET
BASOS ABS: 0 10*3/uL (ref 0.0–0.1)
BASOS PCT: 0 %
Eosinophils Absolute: 0.3 10*3/uL (ref 0.0–0.7)
Eosinophils Relative: 2 %
HEMATOCRIT: 38.8 % (ref 36.0–46.0)
Hemoglobin: 12.6 g/dL (ref 12.0–15.0)
Lymphocytes Relative: 39 %
Lymphs Abs: 4.5 10*3/uL — ABNORMAL HIGH (ref 0.7–4.0)
MCH: 26.8 pg (ref 26.0–34.0)
MCHC: 32.5 g/dL (ref 30.0–36.0)
MCV: 82.6 fL (ref 78.0–100.0)
MONO ABS: 0.8 10*3/uL (ref 0.1–1.0)
Monocytes Relative: 7 %
NEUTROS ABS: 6.1 10*3/uL (ref 1.7–7.7)
NEUTROS PCT: 52 %
PLATELETS: 353 10*3/uL (ref 150–400)
RBC: 4.7 MIL/uL (ref 3.87–5.11)
RDW: 14.3 % (ref 11.5–15.5)
WBC: 11.7 10*3/uL — ABNORMAL HIGH (ref 4.0–10.5)

## 2015-05-29 LAB — URINALYSIS, ROUTINE W REFLEX MICROSCOPIC
Bilirubin Urine: NEGATIVE
GLUCOSE, UA: NEGATIVE mg/dL
Hgb urine dipstick: NEGATIVE
Ketones, ur: NEGATIVE mg/dL
LEUKOCYTES UA: NEGATIVE
Nitrite: NEGATIVE
PH: 6 (ref 5.0–8.0)
Protein, ur: NEGATIVE mg/dL
SPECIFIC GRAVITY, URINE: 1.02 (ref 1.005–1.030)

## 2015-05-29 LAB — COMPREHENSIVE METABOLIC PANEL
ALBUMIN: 3.6 g/dL (ref 3.5–5.0)
ALT: 24 U/L (ref 14–54)
AST: 22 U/L (ref 15–41)
Alkaline Phosphatase: 79 U/L (ref 38–126)
Anion gap: 7 (ref 5–15)
BILIRUBIN TOTAL: 0.2 mg/dL — AB (ref 0.3–1.2)
BUN: 11 mg/dL (ref 6–20)
CHLORIDE: 105 mmol/L (ref 101–111)
CO2: 25 mmol/L (ref 22–32)
Calcium: 9 mg/dL (ref 8.9–10.3)
Creatinine, Ser: 0.83 mg/dL (ref 0.44–1.00)
GFR calc Af Amer: >60 mL/min (ref >60)
GFR calc non Af Amer: >60 mL/min (ref >60)
GLUCOSE: 107 mg/dL — AB (ref 65–99)
POTASSIUM: 3.9 mmol/L (ref 3.5–5.1)
Sodium: 137 mmol/L (ref 135–145)
Total Protein: 6.6 g/dL (ref 6.5–8.1)

## 2015-05-29 LAB — LIPASE, BLOOD: Lipase: 35 U/L (ref 11–51)

## 2015-05-29 LAB — PREGNANCY, URINE: Preg Test, Ur: NEGATIVE

## 2015-05-29 MED ORDER — ONDANSETRON HCL 4 MG/2ML IJ SOLN
4.0000 mg | Freq: Once | INTRAMUSCULAR | Status: AC
  Administered 2015-05-29: 4 mg via INTRAVENOUS
  Filled 2015-05-29: qty 2

## 2015-05-29 MED ORDER — IOHEXOL 300 MG/ML  SOLN
50.0000 mL | Freq: Once | INTRAMUSCULAR | Status: AC | PRN
  Administered 2015-05-29: 50 mL via ORAL

## 2015-05-29 MED ORDER — TRAMADOL HCL 50 MG PO TABS: 50.0000 mg | ORAL_TABLET | Freq: Four times a day (QID) | ORAL | Status: DC | PRN

## 2015-05-29 MED ORDER — IOHEXOL 300 MG/ML  SOLN
50.0000 mL | Freq: Once | INTRAMUSCULAR | Status: DC | PRN
  Administered 2015-05-29: 50 mL via INTRAVENOUS

## 2015-05-29 MED ORDER — SODIUM CHLORIDE 0.9 % IV BOLUS (SEPSIS)
1000.0000 mL | Freq: Once | INTRAVENOUS | Status: AC
  Administered 2015-05-29: 1000 mL via INTRAVENOUS

## 2015-05-29 MED ORDER — MORPHINE SULFATE (PF) 4 MG/ML IV SOLN
4.0000 mg | Freq: Once | INTRAVENOUS | Status: AC
  Administered 2015-05-29: 4 mg via INTRAVENOUS
  Filled 2015-05-29: qty 1

## 2015-05-29 NOTE — ED Notes (Signed)
Patient reports that she has had bilateral abdominal pain and flank pain x 2 days. Denies any N/V

## 2015-05-29 NOTE — ED Provider Notes (Signed)
CSN: [id number]     Arrival date & time 05/29/15  1955 History   By signing my name below, I, Julien Nordmann, attest that this documentation has been prepared under the direction and in the presence of Julianne Rice, MD. Electronically Signed: Julien Nordmann, ED Scribe. 05/29/2015. 8:34 PM.    Chief Complaint  Patient presents with  . Abdominal Pain     The history is provided by the patient. No language interpreter was used.   HPI Comments: LILIANY WITHINGTON is a 39 y.o. female who presents to the Emergency Department complaining of gradual onset left sided abdominal pain starting two days ago. She notes the pain radiates to her left flank. She reports having similar pain in the past but not this severe. Pt denies nausea, vomiting, diarrhea, constipation, gross blood in stool or melena, fever, hematuria, dysuria, urinary frequency, vaginal bleeding or discharge. Patient has past medical history of ulcerative colitis and is currently on no medications for this. No previous abdominal surgeries.  Past Medical History  Diagnosis Date  . Arthritis   . Migraines    Past Surgical History  Procedure Laterality Date  . Dilation and curettage of uterus     History reviewed. No pertinent family history. Social History  Substance Use Topics  . Smoking status: Current Every Day Smoker -- 4.00 packs/day for 10 years    Types: Cigarettes  . Smokeless tobacco: None  . Alcohol Use: No   OB History    No data available     Review of Systems  Constitutional: Negative for fever and chills.  Respiratory: Negative for shortness of breath.   Cardiovascular: Negative for chest pain.  Gastrointestinal: Positive for abdominal pain. Negative for nausea, vomiting, diarrhea, constipation, blood in stool and abdominal distention.  Genitourinary: Positive for flank pain. Negative for dysuria, hematuria, vaginal bleeding, vaginal discharge, difficulty urinating and pelvic pain.  Musculoskeletal: Positive  for myalgias and back pain. Negative for neck pain and neck stiffness.  Neurological: Negative for dizziness, weakness, light-headedness, numbness and headaches.  All other systems reviewed and are negative.     Allergies  Review of patient's allergies indicates no known allergies.  Home Medications   Prior to Admission medications   Medication Sig Start Date End Date Taking? Authorizing Provider  acetaminophen (TYLENOL) 500 MG tablet Take 1,000-2,000 mg by mouth every 6 (six) hours as needed. For pain    Historical Provider, MD  meloxicam (MOBIC) 15 MG tablet Take 1 tablet (15 mg total) by mouth daily. 05/09/14   Kaitlyn Szekalski, PA-C  omeprazole (PRILOSEC) 40 MG capsule Take 40 mg by mouth daily.    Historical Provider, MD  traMADol (ULTRAM) 50 MG tablet Take 1 tablet (50 mg total) by mouth every 6 (six) hours as needed. 05/29/15   Julianne Rice, MD   Triage vitals: BP 124/88 mmHg  Pulse 100  Temp(Src) 98.4 F (36.9 C) (Oral)  Resp 18  Ht 5\' 11"  (1.803 m)  Wt 300 lb (136.079 kg)  BMI 41.86 kg/m2  SpO2 100%  LMP 04/08/2015 Physical Exam  Constitutional: She is oriented to person, place, and time. She appears well-developed and well-nourished. No distress.  HENT:  Head: Normocephalic and atraumatic.  Mouth/Throat: Oropharynx is clear and moist.  Eyes: EOM are normal. Pupils are equal, round, and reactive to light.  Neck: Normal range of motion. Neck supple.  Cardiovascular: Normal rate and regular rhythm.  Exam reveals no gallop and no friction rub.   No murmur heard. Pulmonary/Chest: Effort  normal and breath sounds normal. No respiratory distress. She has no wheezes. She has no rales. She exhibits no tenderness.  Abdominal: Soft. Bowel sounds are normal. She exhibits no distension and no mass. There is tenderness (patient has left upper and left lower quadrant tenderness to palpation.). There is no rebound and no guarding.  Musculoskeletal: Normal range of motion. She  exhibits no edema or tenderness.  Mild left paraspinal lumbar tenderness. No definite CVA tenderness.  Neurological: She is alert and oriented to person, place, and time.  Moves all extremities without deficit. Sensation is fully intact.  Skin: Skin is warm and dry. No rash noted. No erythema.  Psychiatric: She has a normal mood and affect. Her behavior is normal.  Nursing note and vitals reviewed.   ED Course  Procedures  DIAGNOSTIC STUDIES: Oxygen Saturation is 100% on RA, normal by my interpretation.  COORDINATION OF CARE:  8:32 PM Discussed treatment plan which includes scan of abdomen with pt at bedside and pt agreed to plan.  Labs Review Labs Reviewed  CBC WITH DIFFERENTIAL/PLATELET - Abnormal; Notable for the following:    WBC 11.7 (*)    Lymphs Abs 4.5 (*)    All other components within normal limits  COMPREHENSIVE METABOLIC PANEL - Abnormal; Notable for the following:    Glucose, Bld 107 (*)    Total Bilirubin 0.2 (*)    All other components within normal limits  URINALYSIS, ROUTINE W REFLEX MICROSCOPIC (NOT AT [facility]) - Abnormal; Notable for the following:    APPearance CLOUDY (*)    All other components within normal limits  LIPASE, BLOOD  PREGNANCY, URINE    Imaging Review Ct Abdomen Pelvis W Contrast  05/29/2015  CLINICAL DATA:  39 year old female with left-sided abdominal pain. EXAM: CT ABDOMEN AND PELVIS WITH CONTRAST TECHNIQUE: Multidetector CT imaging of the abdomen and pelvis was performed using the standard protocol following bolus administration of intravenous contrast. CONTRAST:  73mL OMNIPAQUE IOHEXOL 300 MG/ML SOLN, 59mL OMNIPAQUE IOHEXOL 300 MG/ML SOLN COMPARISON:  Pelvic ultrasound dated 05/09/2011 and CT dated 04/21/2006 FINDINGS: The visualized lung bases are clear. No intra-abdominal free air or free fluid. The liver, gallbladder, pancreas, spleen, adrenal glands, kidneys, visualized ureters, and urinary bladder appear unremarkable. The uterus is  anteverted and grossly unremarkable. There small bilateral ovarian follicles. There is no evidence of bowel obstruction or inflammation. Normal appendix. The abdominal aorta and IVC appear unremarkable. No portal venous gas identified. There is no lymphadenopathy. Small fat containing umbilical hernia. The abdominal wall soft tissues appear unremarkable. There are bilateral L5 pars defects with grade 1 L5-S1 anterolisthesis. The osseous structures are otherwise unremarkable. No acute fracture. IMPRESSION: No acute intra-abdominal or pelvic pathology. Electronically Signed   By: Anner Crete M.D.   On: 05/29/2015 23:04   I have personally reviewed and evaluated these images and lab results as part of my medical decision-making.   EKG Interpretation None      MDM   Final diagnoses:  Left upper quadrant pain    I personally performed the services described in this documentation, which was scribed in my presence. The recorded information has been reviewed and is accurate.   Patient's pain is improved. CT without any acute findings. Patient admits to taking frequent ibuprofen 800 mg for arthritis. Advised to discontinue all NSAIDs and take Prilosec daily. Also advised that she can take over-the-counter Mylanta or Maalox. I encouraged the patient to make appointment to follow-up with her gastroenterologist. Return precautions.  Julianne Rice, MD  05/29/15 2316 

## 2015-05-29 NOTE — ED Notes (Signed)
PIV removed from R AC; catheter intact, site unremarkable with no redness or swelling.

## 2015-05-29 NOTE — Discharge Instructions (Signed)

## 2015-06-18 ENCOUNTER — Emergency Department (HOSPITAL_BASED_OUTPATIENT_CLINIC_OR_DEPARTMENT_OTHER)
Admission: EM | Admit: 2015-06-18 | Discharge: 2015-06-18 | Disposition: A | Discharge status: Home / Self Care | Payer: Medicaid Other | Attending: Emergency Medicine | Admitting: Emergency Medicine

## 2015-06-18 ENCOUNTER — Encounter (HOSPITAL_BASED_OUTPATIENT_CLINIC_OR_DEPARTMENT_OTHER): Payer: Self-pay | Admitting: *Deleted

## 2015-06-18 ENCOUNTER — Emergency Department (HOSPITAL_BASED_OUTPATIENT_CLINIC_OR_DEPARTMENT_OTHER): Payer: Medicaid Other

## 2015-06-18 DIAGNOSIS — R05 Cough: Secondary | ICD-10-CM | POA: Diagnosis present

## 2015-06-18 DIAGNOSIS — Z79899 Other long term (current) drug therapy: Secondary | ICD-10-CM | POA: Diagnosis not present

## 2015-06-18 DIAGNOSIS — Z7952 Long term (current) use of systemic steroids: Secondary | ICD-10-CM | POA: Insufficient documentation

## 2015-06-18 DIAGNOSIS — Z8679 Personal history of other diseases of the circulatory system: Secondary | ICD-10-CM | POA: Diagnosis not present

## 2015-06-18 DIAGNOSIS — Z8739 Personal history of other diseases of the musculoskeletal system and connective tissue: Secondary | ICD-10-CM | POA: Diagnosis not present

## 2015-06-18 DIAGNOSIS — R112 Nausea with vomiting, unspecified: Secondary | ICD-10-CM | POA: Insufficient documentation

## 2015-06-18 DIAGNOSIS — R079 Chest pain, unspecified: Secondary | ICD-10-CM | POA: Insufficient documentation

## 2015-06-18 DIAGNOSIS — J069 Acute upper respiratory infection, unspecified: Secondary | ICD-10-CM | POA: Diagnosis not present

## 2015-06-18 DIAGNOSIS — F17210 Nicotine dependence, cigarettes, uncomplicated: Secondary | ICD-10-CM | POA: Insufficient documentation

## 2015-06-18 MED ORDER — AZITHROMYCIN 250 MG PO TABS: 250.0000 mg | ORAL_TABLET | Freq: Every day | ORAL | Status: DC

## 2015-06-18 MED ORDER — PREDNISONE 50 MG PO TABS
60.0000 mg | ORAL_TABLET | Freq: Once | ORAL | Status: AC
  Administered 2015-06-18: 60 mg via ORAL
  Filled 2015-06-18: qty 1

## 2015-06-18 MED ORDER — IPRATROPIUM-ALBUTEROL 0.5-2.5 (3) MG/3ML IN SOLN
3.0000 mL | Freq: Four times a day (QID) | RESPIRATORY_TRACT | Status: DC
  Administered 2015-06-18: 3 mL via RESPIRATORY_TRACT
  Filled 2015-06-18: qty 3

## 2015-06-18 MED ORDER — PREDNISONE 20 MG PO TABS: 60.0000 mg | ORAL_TABLET | Freq: Every day | ORAL | Status: DC

## 2015-06-18 NOTE — Discharge Instructions (Signed)
Ms. Sheryl Mcmillan,  Nice meeting you! Please follow-up with your primary care provider as needed. Return to the emergency department if you develop fevers or do not improve on antibiotics. Feel better soon!  S. Wendie Simmer, PA-C
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
## 2015-06-30 NOTE — ED Provider Notes (Signed)
CSN: [id number]     Arrival date & time 06/18/15  1015 History   First MD Initiated Contact with Patient 06/18/15 1303     Chief Complaint  Patient presents with  . Cough  . Fever   HPI  Sheryl Mcmillan is a 40 y.o. F PMH significant for arthritis and migraines presenting with an 8 day history of cough, fever, sore throat, nausea and vomiting. She endorses chest pain from coughing (diffuse, achy, non-radiating, 8/10 pain scale) and subjective fevers. Her vomiting has been post-tussive. She was seen by her PCP 3 days ago, was given IM rocephin and Tessalon Perles, and this provided minimal relief. She has also tried OTC cough medications as well. She denies shortness of breath, abdominal pain, changes in bladder/bowel habits.   Past Medical History  Diagnosis Date  . Arthritis   . Migraines    Past Surgical History  Procedure Laterality Date  . Dilation and curettage of uterus     History reviewed. No pertinent family history. Social History  Substance Use Topics  . Smoking status: Current Every Day Smoker -- 4.00 packs/day for 10 years    Types: Cigarettes  . Smokeless tobacco: None  . Alcohol Use: No   OB History    No data available     Review of Systems  Ten systems are reviewed and are negative for acute change except as noted in the HPI   Allergies  Review of patient's allergies indicates no known allergies.  Home Medications   Prior to Admission medications   Medication Sig Start Date End Date Taking? Authorizing Provider  guaiFENesin (MUCINEX) 600 MG 12 hr tablet Take by mouth 2 (two) times daily.   Yes Historical Provider, MD  azithromycin (ZITHROMAX) 250 MG tablet Take 1 tablet (250 mg total) by mouth daily. Take first 2 tablets together, then 1 every day until finished. 06/18/15   Kapaa Lions, PA-C  predniSONE (DELTASONE) 20 MG tablet Take 3 tablets (60 mg total) by mouth daily. 06/18/15   Ulysses Lions, PA-C  traMADol (ULTRAM) 50 MG tablet  Take 1 tablet (50 mg total) by mouth every 6 (six) hours as needed. 05/29/15   Julianne Rice, MD   BP 142/73 mmHg  Pulse 87  Temp(Src) 98 F (36.7 C) (Oral)  Resp 28  Ht 5\' 11"  (1.803 m)  Wt 145.151 kg  BMI 44.65 kg/m2  SpO2 100%  LMP 06/16/2015 Physical Exam  ED Course  Procedures  Imaging Review Dg Chest 2 View  06/18/2015  CLINICAL DATA:  40 year old female with productive cough, fever and sore throat EXAM: CHEST  2 VIEW COMPARISON:  None. FINDINGS: Normal cardiac and mediastinal contours. Inspiratory volumes are also within normal limits. There is no focal airspace consolidation to suggest pneumonia. No pleural effusion, pulmonary edema or pneumothorax. Central airway thickening/ peribronchial cuffing is present in the visualized right lower lobe airways. No acute osseous abnormality. IMPRESSION: 1. Central airway thickening/peribronchial cuffing most notable in the right lower lobe. Differential considerations include viral respiratory infection and acute bronchitis. 2. No focal airspace consolidation to suggest pneumonia. Electronically Signed   By: Jacqulynn Cadet M.D.   On: 06/18/2015 11:33   I have personally reviewed and evaluated these images and lab results as part of my medical decision-making.  MDM   Final diagnoses:  Upper respiratory infection   Patient non-toxic appearing. Tachypnea and diminished lung sounds on exam. Based on patient history and physical exam, MSK, URI, PNA most likely etiologies. Less  likely etiologies include ACS, PE. Will give breathing treatment and obtain CXR. CXR demonstrates central airway thickening/peribronchial cuffing most notable in the right lower lobe to suggest viral respiratory infection and acute bronchitis but not pneumonia.  Upon reassessment, patient is feeling better. Chest pain resolved. Due to longevity of patient's symptoms and her smoking history, will give abx and steroids. Patient may be safely discharged home. Discussed  reasons for return. Patient to follow-up with primary care provider within one week. Patient in understanding and agreement with the plan.   Ashburn Lions, PA-C 06/30/15 2125  Fredia Sorrow, MD 07/14/15 1242

## 2016-05-16 DIAGNOSIS — M25571 Pain in right ankle and joints of right foot: Secondary | ICD-10-CM | POA: Insufficient documentation

## 2016-05-16 DIAGNOSIS — S8264XA Nondisplaced fracture of lateral malleolus of right fibula, initial encounter for closed fracture: Secondary | ICD-10-CM | POA: Insufficient documentation

## 2016-06-12 ENCOUNTER — Encounter (HOSPITAL_BASED_OUTPATIENT_CLINIC_OR_DEPARTMENT_OTHER): Payer: Self-pay | Admitting: *Deleted

## 2016-06-12 ENCOUNTER — Emergency Department (HOSPITAL_BASED_OUTPATIENT_CLINIC_OR_DEPARTMENT_OTHER)
Admission: EM | Admit: 2016-06-12 | Discharge: 2016-06-13 | Disposition: A | Discharge status: Home / Self Care | Payer: Medicaid Other | Attending: Emergency Medicine | Admitting: Emergency Medicine

## 2016-06-12 DIAGNOSIS — K645 Perianal venous thrombosis: Secondary | ICD-10-CM

## 2016-06-12 DIAGNOSIS — F17210 Nicotine dependence, cigarettes, uncomplicated: Secondary | ICD-10-CM | POA: Insufficient documentation

## 2016-06-12 MED ORDER — LIDOCAINE-EPINEPHRINE (PF) 1 %-1:200000 IJ SOLN
INTRAMUSCULAR | Status: AC
  Filled 2016-06-12: qty 30

## 2016-06-12 MED ORDER — LIDOCAINE-EPINEPHRINE 1 %-1:100000 IJ SOLN: 10.0000 mL | Freq: Once | INTRAMUSCULAR | Status: DC

## 2016-06-12 NOTE — ED Provider Notes (Signed)
[place] [facility] Provider Note   CSN: [id number] Arrival date & time: 06/12/16  2248  By signing my name below, I, Sheryl Mcmillan, attest that this documentation has been prepared under the direction and in the presence of Everlene Balls, MD. Electronically Signed: Judithann Sauger, ED Scribe. 06/12/16. 11:14 PM.   History   Chief Complaint Chief Complaint  Patient presents with  . Hemorrhoids    HPI Comments: Sheryl Mcmillan is a 40 y.o. female with a hx of thrombosed hemorrhoids (x2) who presents to the Emergency Department complaining of gradually worsening moderately painful non-bleeding area of swelling to her rectum consistent with a hemorrhoid onset last night. She notes that the pain is worse with sitting. She denies any recent constipation. No alleviating factors noted. Pt has not tried any medications PTA. She has NKDA. She denies any fever, chills, nausea, vomiting, generalized rash, or any other symptoms.   The history is provided by the patient. No language interpreter was used.    Past Medical History:  Diagnosis Date  . Arthritis   . Migraines     There are no active problems to display for this patient.   Past Surgical History:  Procedure Laterality Date  . DILATION AND CURETTAGE OF UTERUS      OB History    No data available       Home Medications    Prior to Admission medications   Not on File    Family History History reviewed. No pertinent family history.  Social History Social History  Substance Use Topics  . Smoking status: Current Every Day Smoker    Packs/day: 0.50    Years: 10.00    Types: Cigarettes  . Smokeless tobacco: Never Used  . Alcohol use No     Allergies   Patient has no known allergies.   Review of Systems Review of Systems  A complete 10 system review of systems was obtained and all systems are negative except as noted in the HPI and PMH.   Physical Exam Updated Vital Signs BP (!) 147/120 (BP  Location: Left Arm)   Pulse (!) 128   Temp 98.2 F (36.8 C) (Oral)   Resp 20   Ht 5\' 11"  (1.803 m)   Wt (!) 320 lb (145.2 kg)   SpO2 98%   BMI 44.63 kg/m   Physical Exam  Constitutional: She is oriented to person, place, and time. She appears well-developed and well-nourished. No distress.  HENT:  Head: Normocephalic and atraumatic.  Nose: Nose normal.  Mouth/Throat: Oropharynx is clear and moist. No oropharyngeal exudate.  Eyes: Conjunctivae and EOM are normal. Pupils are equal, round, and reactive to light. No scleral icterus.  Neck: Normal range of motion. Neck supple. No JVD present. No tracheal deviation present. No thyromegaly present.  Cardiovascular: Normal rate, regular rhythm and normal heart sounds.  Exam reveals no gallop and no friction rub.   No murmur heard. Pulmonary/Chest: Effort normal and breath sounds normal. No respiratory distress. She has no wheezes. She exhibits no tenderness.  Abdominal: Soft. Bowel sounds are normal. She exhibits no distension and no mass. There is no tenderness. There is no rebound and no guarding.  Genitourinary: Rectal exam shows external hemorrhoid.  Genitourinary Comments: Thrombosed external hemorrhoid at 9 o clock, TTP  Musculoskeletal: Normal range of motion. She exhibits no edema or tenderness.  Lymphadenopathy:    She has no cervical adenopathy.  Neurological: She is alert and oriented to person, place, and time. No cranial  nerve deficit. She exhibits normal muscle tone.  Skin: Skin is warm and dry. No rash noted. No erythema. No pallor.  Nursing note and vitals reviewed.    ED Treatments / Results  DIAGNOSTIC STUDIES: Oxygen Saturation is 98% on RA, normal by my interpretation.    COORDINATION OF CARE: 11:13 PM- Pt advised of plan for treatment and pt agrees. Pt will receive an I&D.    Labs (all labs ordered are listed, but only abnormal results are displayed) Labs Reviewed - No data to display  EKG  EKG  Interpretation None       Radiology No results found.  Procedures .Marland KitchenIncision and Drainage Date/Time: 06/12/2016 11:14 PM Performed by: Everlene Balls Authorized by: Everlene Balls   Consent:    Consent obtained:  Verbal   Consent given by:  Patient   Risks discussed:  Pain Location:    Type:  External thrombosed hemorrhoid   Location:  Anogenital   Anogenital location:  Rectum Pre-procedure details:    Skin preparation:  Betadine Anesthesia (see MAR for exact dosages):    Anesthesia method:  Local infiltration   Local anesthetic:  Lidocaine 1% WITH epi Procedure type:    Complexity:  Simple Procedure details:    Incision types:  Stab incision Post-procedure details:    Patient tolerance of procedure:  Tolerated well, no immediate complications   (including critical care time)  Medications Ordered in ED Medications - No data to display   Initial Impression / Assessment and Plan / ED Course  Everlene Balls, MD has reviewed the triage vital signs and the nursing notes.  Pertinent labs & imaging results that were available during my care of the patient were reviewed by me and considered in my medical decision making (see chart for details).  Clinical Course     Patient presents to the ED for thrombosed hemorrhoid.  Will I&D.  Patient very familiar with diagnosis and plans on sitz baths at home.  Rec PCP fu for wound check.  She appears well and in NAD.  VS remain within her normal limits and she Is safe for DC.  Final Clinical Impressions(s) / ED Diagnoses   Final diagnoses:  None    New Prescriptions New Prescriptions   No medications on file      I personally performed the services described in this documentation, which was scribed in my presence. The recorded information has been reviewed and is accurate.      Everlene Balls, MD 06/12/16 2350

## 2016-06-12 NOTE — ED Triage Notes (Signed)
Pt reports thrombosed hemorrhoid.  Hx of same.

## 2016-06-13 NOTE — ED Notes (Signed)
In to assess the patient. Patient is laying face down on bed. Noted Hemorid.

## 2016-06-13 NOTE — ED Notes (Signed)
Patient has some noted bleeding from hemmoriod site. Care discussed with the patient

## 2016-07-03 ENCOUNTER — Encounter (HOSPITAL_BASED_OUTPATIENT_CLINIC_OR_DEPARTMENT_OTHER): Payer: Self-pay | Admitting: Emergency Medicine

## 2016-07-03 ENCOUNTER — Emergency Department (HOSPITAL_BASED_OUTPATIENT_CLINIC_OR_DEPARTMENT_OTHER)
Admission: EM | Admit: 2016-07-03 | Discharge: 2016-07-03 | Disposition: A | Discharge status: Home / Self Care | Payer: Medicaid Other | Attending: Emergency Medicine | Admitting: Emergency Medicine

## 2016-07-03 DIAGNOSIS — J020 Streptococcal pharyngitis: Secondary | ICD-10-CM

## 2016-07-03 DIAGNOSIS — F17210 Nicotine dependence, cigarettes, uncomplicated: Secondary | ICD-10-CM | POA: Insufficient documentation

## 2016-07-03 HISTORY — DX: Polycystic ovarian syndrome: E28.2

## 2016-07-03 HISTORY — DX: Obesity, unspecified: E66.9

## 2016-07-03 LAB — RAPID STREP SCREEN (MED CTR MEBANE ONLY): STREPTOCOCCUS, GROUP A SCREEN (DIRECT): POSITIVE — AB

## 2016-07-03 MED ORDER — PENICILLIN G BENZATHINE & PROC 1200000 UNIT/2ML IM SUSP
1.2000 10*6.[IU] | Freq: Once | INTRAMUSCULAR | Status: AC
  Administered 2016-07-03: 1.2 10*6.[IU] via INTRAMUSCULAR
  Filled 2016-07-03: qty 2

## 2016-07-03 MED ORDER — IBUPROFEN 400 MG PO TABS
600.0000 mg | ORAL_TABLET | Freq: Once | ORAL | Status: DC
  Filled 2016-07-03: qty 1

## 2016-07-03 MED ORDER — DEXAMETHASONE SODIUM PHOSPHATE 10 MG/ML IJ SOLN
10.0000 mg | Freq: Once | INTRAMUSCULAR | Status: AC
  Administered 2016-07-03: 10 mg via INTRAMUSCULAR
  Filled 2016-07-03: qty 1

## 2016-07-03 NOTE — ED Triage Notes (Addendum)
Sore throat and swelling of neck on left.  Sts she is having a hard time swallowing. Swelling noted on left in back of mouth.  Husband recently treated for Strep Throat.

## 2016-07-03 NOTE — ED Provider Notes (Signed)
[place] [facility] Provider Note   CSN: [id number] Arrival date & time: 07/03/16  0017     History   Chief Complaint Chief Complaint  Patient presents with  . Sore Throat    HPI Sheryl Mcmillan is a 41 y.o. female with no significant past medical history presenting today with a sore throat. She states this all started today. She started having pain in her throat and dysphagia. It is worse on the left side and she feels like there is a mass there. She denies any drainage, no fevers. Her significant other in the room was recently diagnosed with strep throat as well. She is concerned she has the same. She has not taken anything for this. There are no further complaints.  10 Systems reviewed and are negative for acute change except as noted in the HPI.    HPI  Past Medical History:  Diagnosis Date  . Arthritis   . Migraines   . Obesity   . PCOS (polycystic ovarian syndrome)     There are no active problems to display for this patient.   Past Surgical History:  Procedure Laterality Date  . DILATION AND CURETTAGE OF UTERUS      OB History    No data available       Home Medications    Prior to Admission medications   Not on File    Family History No family history on file.  Social History Social History  Substance Use Topics  . Smoking status: Current Every Day Smoker    Packs/day: 0.50    Years: 10.00    Types: Cigarettes  . Smokeless tobacco: Never Used  . Alcohol use No     Allergies   Patient has no known allergies.   Review of Systems Review of Systems   Physical Exam Updated Vital Signs BP 138/74 (BP Location: Left Arm)   Pulse 83   Temp 98.7 F (37.1 C) (Oral)   Resp 16   Ht 5\' 11"  (1.803 m)   Wt (!) 320 lb (145.2 kg)   LMP 05/24/2016   SpO2 99%   BMI 44.63 kg/m   Physical Exam  Constitutional: She is oriented to person, place, and time. She appears well-developed and well-nourished. No distress.  HENT:  Head:  Normocephalic and atraumatic.  Right Ear: External ear normal.  Left Ear: External ear normal.  Nose: Nose normal.  Mouth/Throat: Oropharynx is clear and moist. No oropharyngeal exudate.  Slightly enlarged tonsil on the left compared to the right. No exudate, no erythema. Cervical lymphadenopathy palpated on the left.  Eyes: Conjunctivae and EOM are normal. Pupils are equal, round, and reactive to light. No scleral icterus.  Neck: Normal range of motion. Neck supple. No JVD present. No tracheal deviation present. No thyromegaly present.  Cardiovascular: Normal rate, regular rhythm and normal heart sounds.  Exam reveals no gallop and no friction rub.   No murmur heard. Pulmonary/Chest: Effort normal and breath sounds normal. No respiratory distress. She has no wheezes. She exhibits no tenderness.  Abdominal: Soft. Bowel sounds are normal. She exhibits no distension and no mass. There is no tenderness. There is no rebound and no guarding.  Musculoskeletal: Normal range of motion. She exhibits no edema or tenderness.  Lymphadenopathy:    She has no cervical adenopathy.  Neurological: She is alert and oriented to person, place, and time. No cranial nerve deficit. She exhibits normal muscle tone.  Skin: Skin is warm and dry. No rash noted. No  erythema. No pallor.  Nursing note and vitals reviewed.    ED Treatments / Results  Labs (all labs ordered are listed, but only abnormal results are displayed) Labs Reviewed  RAPID STREP SCREEN (NOT AT [facility]) - Abnormal; Notable for the following:       Result Value   Streptococcus, Group A Screen (Direct) POSITIVE (*)    All other components within normal limits    EKG  EKG Interpretation None       Radiology No results found.  Procedures Procedures (including critical care time)  Medications Ordered in ED Medications  penicillin g procaine-penicillin g benzathine (BICILLIN-CR) injection 600000-600000 units (not administered)    dexamethasone (DECADRON) injection 10 mg (not administered)  ibuprofen (ADVIL,MOTRIN) tablet 600 mg (not administered)     Initial Impression / Assessment and Plan / ED Course  I have reviewed the triage vital signs and the nursing notes.  Pertinent labs & imaging results that were available during my care of the patient were reviewed by me and considered in my medical decision making (see chart for details).  Clinical Course     patient presents emergency department for sore throat. She is wanting antibiotics for treatment. Will give penicillin injection. Also Decadron for pain and ibuprofen. Advised to take Tylenol and ibuprofen at home as needed.  Primary care follow-up advised within 3 days. She appears well in no acute distress, vital signs within her normal limits and she is safe for discharge.  Final Clinical Impressions(s) / ED Diagnoses   Final diagnoses:  None    New Prescriptions New Prescriptions   No medications on file     Everlene Balls, MD 07/03/16 0200

## 2017-03-20 ENCOUNTER — Encounter (HOSPITAL_BASED_OUTPATIENT_CLINIC_OR_DEPARTMENT_OTHER): Payer: Self-pay

## 2017-03-20 ENCOUNTER — Emergency Department (HOSPITAL_BASED_OUTPATIENT_CLINIC_OR_DEPARTMENT_OTHER)
Admission: EM | Admit: 2017-03-20 | Discharge: 2017-03-20 | Disposition: A | Discharge status: Home / Self Care | Payer: Self-pay | Attending: Emergency Medicine | Admitting: Emergency Medicine

## 2017-03-20 DIAGNOSIS — K0889 Other specified disorders of teeth and supporting structures: Secondary | ICD-10-CM

## 2017-03-20 DIAGNOSIS — K029 Dental caries, unspecified: Secondary | ICD-10-CM | POA: Insufficient documentation

## 2017-03-20 DIAGNOSIS — F17210 Nicotine dependence, cigarettes, uncomplicated: Secondary | ICD-10-CM | POA: Insufficient documentation

## 2017-03-20 MED ORDER — PENICILLIN V POTASSIUM 500 MG PO TABS: 500.0000 mg | ORAL_TABLET | Freq: Four times a day (QID) | ORAL | 0 refills | Status: DC

## 2017-03-20 MED ORDER — PENICILLIN V POTASSIUM 250 MG PO TABS
500.0000 mg | ORAL_TABLET | Freq: Once | ORAL | Status: AC
  Administered 2017-03-20: 500 mg via ORAL
  Filled 2017-03-20: qty 2

## 2017-03-20 NOTE — ED Triage Notes (Signed)
Pt reports waking up out of her sleep due to dental pain. Pt has poor hygiene. Pt reports unable to follow up with dentist due to insurance at this time. Pt denies fevers

## 2017-03-20 NOTE — ED Provider Notes (Signed)
[facility] DEPT M[facility] Provider Note: Georgena Spurling, MD, FACEP  CSN: [id number] MRN: [id number] ARRIVAL: 03/20/17 at [place]: [facility]   HISTORY OF PRESENT ILLNESS  03/20/17 4:34 AM Sheryl Mcmillan is a 41 y.o. female with multiple known caries of the upper teeth. She is planning to have upper teeth removed and replaced with dentures. She is here with pain associated with her left upper 2 premolars. The pain awakened her from sleep about 2 AM and she took Aleve with some relief. She rates her pain as a 7 out of 10, worse with palpation or percussion. She placed over the counter temporary filling material between the 2 teeth. She does not wish any pain medication and is requesting an antibiotic pending dental follow-up. She is declining a dental block.   Past Medical History:  Diagnosis Date  . Arthritis   . Migraines   . Obesity   . PCOS (polycystic ovarian syndrome)     Past Surgical History:  Procedure Laterality Date  . DILATION AND CURETTAGE OF UTERUS      No family history on file.  Social History  Substance Use Topics  . Smoking status: Current Every Day Smoker    Packs/day: 0.50    Years: 10.00    Types: Cigarettes  . Smokeless tobacco: Never Used  . Alcohol use No    Prior to Admission medications   Not on File    Allergies Patient has no known allergies.   REVIEW OF SYSTEMS  Negative except as noted here or in the History of Present Illness.   PHYSICAL EXAMINATION  Initial Vital Signs Blood pressure (!) 140/103, pulse 79, temperature 98.1 F (36.7 C), temperature source Oral, resp. rate 18, height 5\' 11"  (1.803 m), weight (!) 145.2 kg (320 lb), last menstrual period 03/20/2017, SpO2 99 %.  Examination General: Well-developed, obese female in no acute distress; appearance consistent with age of record HENT: normocephalic; atraumatic; multiple missing and carious upper teeth; left upper premolars with artificial  filling material between them, tender to percussion Eyes: pupils equal, round and reactive to light; extraocular muscles intact Neck: supple; no lymphadenopathy Heart: regular rate and rhythm Lungs: clear to auscultation bilaterally Abdomen: soft; nondistended Extremities: No deformity; full range of motion; pulses normal Neurologic: Awake, alert and oriented; motor function intact in all extremities and symmetric; no facial droop Skin: Warm and dry Psychiatric: Normal mood and affect   RESULTS  Summary of this visit's results, reviewed by myself:   EKG Interpretation  Date/Time:    Ventricular Rate:    PR Interval:    QRS Duration:   QT Interval:    QTC Calculation:   R Axis:     Text Interpretation:        Laboratory Studies: No results found for this or any previous visit (from the past 24 hour(s)). Imaging Studies: No results found.  ED COURSE  Nursing notes and initial vitals signs, including pulse oximetry, reviewed.  Vitals:   03/20/17 0339 03/20/17 0340  BP: (!) 140/103   Pulse: 79   Resp: 18   Temp: 98.1 F (36.7 C)   TempSrc: Oral   SpO2: 99%   Weight:  (!) 145.2 kg (320 lb)  Height:  5\' 11"  (1.803 m)    PROCEDURES    ED DIAGNOSES     ICD-10-CM   1. Pain, dental K08.89   2. Dental caries K02.9        Zimir Kittleson,  MD 03/20/17 2706

## 2017-03-20 NOTE — ED Notes (Signed)
ED Provider at bedside. 

## 2017-03-29 ENCOUNTER — Encounter (HOSPITAL_BASED_OUTPATIENT_CLINIC_OR_DEPARTMENT_OTHER): Payer: Self-pay | Admitting: Emergency Medicine

## 2017-03-29 ENCOUNTER — Emergency Department (HOSPITAL_BASED_OUTPATIENT_CLINIC_OR_DEPARTMENT_OTHER)
Admission: EM | Admit: 2017-03-29 | Discharge: 2017-03-29 | Disposition: A | Discharge status: Home / Self Care | Payer: Self-pay | Attending: Emergency Medicine | Admitting: Emergency Medicine

## 2017-03-29 DIAGNOSIS — K645 Perianal venous thrombosis: Secondary | ICD-10-CM

## 2017-03-29 DIAGNOSIS — F17210 Nicotine dependence, cigarettes, uncomplicated: Secondary | ICD-10-CM | POA: Insufficient documentation

## 2017-03-29 HISTORY — DX: Unspecified hemorrhoids: K64.9

## 2017-03-29 MED ORDER — TRAMADOL HCL 50 MG PO TABS
50.0000 mg | ORAL_TABLET | Freq: Once | ORAL | Status: AC
  Administered 2017-03-29: 50 mg via ORAL
  Filled 2017-03-29: qty 1

## 2017-03-29 MED ORDER — LIDOCAINE HCL 2 % IJ SOLN
5.0000 mL | Freq: Once | INTRAMUSCULAR | Status: AC
  Administered 2017-03-29: 100 mg via INTRADERMAL

## 2017-03-29 NOTE — ED Notes (Signed)
C/o thrombosed hemorrhoid x 2 days

## 2017-03-29 NOTE — ED Provider Notes (Signed)
[facility] DEPT [facility] Provider Note   CSN: [id number] Arrival date & time: 03/29/17  0107     History   Chief Complaint Chief Complaint  Patient presents with  . Hemorrhoids    HPI Sheryl Mcmillan is a 41 y.o. female.  The history is provided by the patient.  She has a history of hemorrhoids, and noted a hemorrhoid 2 days ago. It is painful and she rates pain at 8/10. She has used sitz baths and topical creams without any relief. In the past, she has had the thrombosed hemorrhoids incised and drained.  Past Medical History:  Diagnosis Date  . Arthritis   . Hemorrhoids   . Migraines   . Obesity   . PCOS (polycystic ovarian syndrome)     There are no active problems to display for this patient.   Past Surgical History:  Procedure Laterality Date  . COSMETIC SURGERY    . DILATION AND CURETTAGE OF UTERUS    . HEMORRHOID SURGERY      OB History    No data available       Home Medications    Prior to Admission medications   Medication Sig Start Date End Date Taking? Authorizing Provider  penicillin v potassium (VEETID) 500 MG tablet Take 1 tablet (500 mg total) by mouth 4 (four) times daily. X 7 days 03/20/17   Molpus, John, MD    Family History No family history on file.  Social History Social History  Substance Use Topics  . Smoking status: Current Every Day Smoker    Packs/day: 0.50    Years: 10.00    Types: Cigarettes  . Smokeless tobacco: Never Used  . Alcohol use No     Allergies   Patient has no known allergies.   Review of Systems Review of Systems  All other systems reviewed and are negative.    Physical Exam Updated Vital Signs BP (!) 143/111 (BP Location: Right Arm)   Pulse 96   Temp 98.4 F (36.9 C) (Oral)   Resp 16   Ht 5\' 11"  (1.803 m)   Wt (!) 140.6 kg (310 lb)   LMP 03/20/2017   SpO2 100%   BMI 43.24 kg/m   Physical Exam  Nursing note and vitals reviewed.  41 year old female, resting comfortably and in no acute  distress. Vital signs are significant for hypertension. Oxygen saturation is 100%, which is normal. Head is normocephalic and atraumatic. PERRLA, EOMI. Oropharynx is clear. Neck is nontender and supple without adenopathy or JVD. Back is nontender and there is no CVA tenderness. Lungs are clear without rales, wheezes, or rhonchi. Chest is nontender. Heart has regular rate and rhythm without murmur. Abdomen is soft, flat, nontender without masses or hepatosplenomegaly and peristalsis is normoactive. Rectal: Thrombosed external hemorrhoid. Extremities have no cyanosis or edema, full range of motion is present. Skin is warm and dry without rash. Neurologic: Mental status is normal, cranial nerves are intact, there are no motor or sensory deficits.  ED Treatments / Results   Procedures Procedures (including critical care time) INCISION AND DRAINAGE Performed by: UKGUR,KYHCW Consent: Verbal consent obtained. Risks and benefits: risks, benefits and alternatives were discussed Type: abscess  Body area: Hemorrhoid  Anesthesia: local infiltration  Incision was made with a scalpel.  Local anesthetic: lidocaine 2% without epinephrine  Anesthetic total: 1 ml   Drainage: clot  Patient tolerance: Patient tolerated the procedure well with no immediate complications.     Medications Ordered in ED Medications  lidocaine (XYLOCAINE) 2 % (with pres) injection 100 mg (not administered)  traMADol (ULTRAM) tablet 50 mg (50 mg Oral Given 03/29/17 0235)     Initial Impression / Assessment and Plan / ED Course  I have reviewed the triage vital signs and the nursing notes.  Thrombosed external hemorrhoid. Old records are reviewed confirming prior ED visit for thrombosed hemorrhoids treated with incision and drainage. Patient was given option of topical steroids versus incision and drainage and has requested that I proceed with incision and drainage. Patient felt significantly better following  incision and drainage, and she is discharged with routine hemorrhoid care. Because of recurrent thrombosed hemorrhoids, she is referred to general surgery for consideration for hemorrhoidectomy  Final Clinical Impressions(s) / ED Diagnoses   Final diagnoses:  Thrombosed external hemorrhoid    New Prescriptions New Prescriptions   No medications on file     Delora Fuel, MD [birth year]40 [phone]

## 2017-03-29 NOTE — ED Triage Notes (Signed)
Pt sts she has a "thrombosed Hemorrhoid".  Sts it came out yesterday and she had had them before.

## 2017-07-04 ENCOUNTER — Encounter (HOSPITAL_BASED_OUTPATIENT_CLINIC_OR_DEPARTMENT_OTHER): Payer: Self-pay | Admitting: *Deleted

## 2017-07-04 ENCOUNTER — Emergency Department (HOSPITAL_BASED_OUTPATIENT_CLINIC_OR_DEPARTMENT_OTHER)
Admission: EM | Admit: 2017-07-04 | Discharge: 2017-07-04 | Discharge status: Against Medical Advice | Payer: Self-pay | Attending: Emergency Medicine | Admitting: Emergency Medicine

## 2017-07-04 ENCOUNTER — Other Ambulatory Visit: Payer: Self-pay

## 2017-07-04 DIAGNOSIS — Z5321 Procedure and treatment not carried out due to patient leaving prior to being seen by health care provider: Secondary | ICD-10-CM | POA: Insufficient documentation

## 2017-07-04 DIAGNOSIS — K0889 Other specified disorders of teeth and supporting structures: Secondary | ICD-10-CM | POA: Insufficient documentation

## 2017-07-04 NOTE — ED Triage Notes (Signed)
Dental pain since last night

## 2017-07-16 ENCOUNTER — Encounter (HOSPITAL_BASED_OUTPATIENT_CLINIC_OR_DEPARTMENT_OTHER): Payer: Self-pay

## 2017-07-16 ENCOUNTER — Emergency Department (HOSPITAL_BASED_OUTPATIENT_CLINIC_OR_DEPARTMENT_OTHER)
Admission: EM | Admit: 2017-07-16 | Discharge: 2017-07-16 | Disposition: A | Discharge status: Home / Self Care | Payer: Self-pay | Attending: Emergency Medicine | Admitting: Emergency Medicine

## 2017-07-16 ENCOUNTER — Other Ambulatory Visit: Payer: Self-pay

## 2017-07-16 DIAGNOSIS — F17210 Nicotine dependence, cigarettes, uncomplicated: Secondary | ICD-10-CM | POA: Insufficient documentation

## 2017-07-16 DIAGNOSIS — K0889 Other specified disorders of teeth and supporting structures: Secondary | ICD-10-CM | POA: Insufficient documentation

## 2017-07-16 MED ORDER — AMOXICILLIN 500 MG PO CAPS: 500.0000 mg | ORAL_CAPSULE | Freq: Three times a day (TID) | ORAL | 0 refills | Status: DC

## 2017-07-16 MED ORDER — NAPROXEN 375 MG PO TABS: 375.0000 mg | ORAL_TABLET | Freq: Two times a day (BID) | ORAL | 0 refills | Status: DC

## 2017-07-16 NOTE — ED Triage Notes (Signed)
C/o right upper toothache x "forever"-states she has dentist appt next week-requesting abx

## 2017-07-16 NOTE — ED Notes (Signed)
Pt verbalizes understanding of d/c instructions and denies any further needs at this time. 

## 2017-07-16 NOTE — ED Provider Notes (Signed)
[facility] EMERGENCY DEPARTMENT Provider Note   CSN: [id number] Arrival date & time: 07/16/17  1652     History   Chief Complaint Chief Complaint  Patient presents with  . Dental Pain    HPI Sheryl Mcmillan is a 42 y.o. female.  Patient with broken tooth (#??), occurred while eating tootsie roll earlier today. Has appointment with her dentist on Friday.   The history is provided by the patient. No language interpreter was used.  Dental Pain   This is a new problem. The current episode started 6 to 12 hours ago. The problem occurs constantly. The pain is moderate. She has tried nothing for the symptoms.    Past Medical History:  Diagnosis Date  . Arthritis   . Hemorrhoids   . Migraines   . Obesity   . PCOS (polycystic ovarian syndrome)     There are no active problems to display for this patient.   Past Surgical History:  Procedure Laterality Date  . COSMETIC SURGERY    . DILATION AND CURETTAGE OF UTERUS    . HEMORRHOID SURGERY      OB History    No data available       Home Medications    Prior to Admission medications   Not on File    Family History No family history on file.  Social History Social History   Tobacco Use  . Smoking status: Current Every Day Smoker    Packs/day: 0.50    Years: 10.00    Pack years: 5.00    Types: Cigarettes  . Smokeless tobacco: Never Used  Substance Use Topics  . Alcohol use: No  . Drug use: No     Allergies   Patient has no known allergies.   Review of Systems Review of Systems  HENT: Positive for dental problem.   All other systems reviewed and are negative.    Physical Exam Updated Vital Signs BP (!) 133/95 (BP Location: Right Arm)   Pulse 85   Temp 98.7 F (37.1 C) (Oral)   Resp 18   Ht 5\' 11"  (1.803 m)   Wt (!) 137 kg (302 lb 0.5 oz)   LMP 06/20/2017   SpO2 99%   BMI 42.12 kg/m   Physical Exam  Constitutional: She is oriented to person, place, and time. She appears  well-developed and well-nourished.  HENT:  Mouth/Throat:    Widespread dental decay.  Eyes: Conjunctivae are normal.  Neck: Neck supple.  Cardiovascular: Normal rate and regular rhythm.  Pulmonary/Chest: Effort normal and breath sounds normal.  Abdominal: Soft. Bowel sounds are normal.  Musculoskeletal: Normal range of motion. She exhibits no edema.  Neurological: She is alert and oriented to person, place, and time.  Skin: Skin is warm and dry.  Psychiatric: She has a normal mood and affect.  Nursing note and vitals reviewed.    ED Treatments / Results  Labs (all labs ordered are listed, but only abnormal results are displayed) Labs Reviewed - No data to display  EKG  EKG Interpretation None       Radiology No results found.  Procedures Procedures (including critical care time)  Medications Ordered in ED Medications - No data to display   Initial Impression / Assessment and Plan / ED Course  I have reviewed the triage vital signs and the nursing notes.  Pertinent labs & imaging results that were available during my care of the patient were reviewed by me and considered in my medical  decision making (see chart for details).     Patient with dentalgia.  No abscess requiring immediate incision and drainage.  Exam not concerning for Ludwig's angina or pharyngeal abscess.  Will treat with amoxicillin and NSAID. Pt instructed to follow-up with dentist.  Discussed return precautions. Pt safe for discharge.  Final Clinical Impressions(s) / ED Diagnoses   Final diagnoses:  Tooth pain    ED Discharge Orders        Ordered    amoxicillin (AMOXIL) 500 MG capsule  3 times daily     07/16/17 1924    naproxen (NAPROSYN) 375 MG tablet  2 times daily     07/16/17 1924       Etta Quill, NP 07/16/17 Kathyrn Drown    Tanna Furry, MD 07/16/17 [phone]

## 2019-06-06 ENCOUNTER — Emergency Department (HOSPITAL_BASED_OUTPATIENT_CLINIC_OR_DEPARTMENT_OTHER): Payer: Self-pay

## 2019-06-06 ENCOUNTER — Encounter (HOSPITAL_BASED_OUTPATIENT_CLINIC_OR_DEPARTMENT_OTHER): Payer: Self-pay | Admitting: Emergency Medicine

## 2019-06-06 ENCOUNTER — Emergency Department (HOSPITAL_BASED_OUTPATIENT_CLINIC_OR_DEPARTMENT_OTHER)
Admission: EM | Admit: 2019-06-06 | Discharge: 2019-06-06 | Disposition: A | Discharge status: Home / Self Care | Payer: Self-pay | Attending: Emergency Medicine | Admitting: Emergency Medicine

## 2019-06-06 ENCOUNTER — Other Ambulatory Visit: Payer: Self-pay

## 2019-06-06 DIAGNOSIS — F17210 Nicotine dependence, cigarettes, uncomplicated: Secondary | ICD-10-CM | POA: Insufficient documentation

## 2019-06-06 DIAGNOSIS — K8050 Calculus of bile duct without cholangitis or cholecystitis without obstruction: Secondary | ICD-10-CM

## 2019-06-06 DIAGNOSIS — R1012 Left upper quadrant pain: Secondary | ICD-10-CM | POA: Insufficient documentation

## 2019-06-06 DIAGNOSIS — K8020 Calculus of gallbladder without cholecystitis without obstruction: Secondary | ICD-10-CM | POA: Insufficient documentation

## 2019-06-06 DIAGNOSIS — M79661 Pain in right lower leg: Secondary | ICD-10-CM | POA: Insufficient documentation

## 2019-06-06 LAB — URINALYSIS, ROUTINE W REFLEX MICROSCOPIC
Bilirubin Urine: NEGATIVE
Glucose, UA: NEGATIVE mg/dL
Ketones, ur: NEGATIVE mg/dL
Leukocytes,Ua: NEGATIVE
Nitrite: NEGATIVE
Protein, ur: NEGATIVE mg/dL
Specific Gravity, Urine: >1.03 — ABNORMAL HIGH (ref 1.005–1.030)
pH: 5.5 (ref 5.0–8.0)

## 2019-06-06 LAB — URINALYSIS, MICROSCOPIC (REFLEX)

## 2019-06-06 LAB — CBC WITH DIFFERENTIAL/PLATELET
Abs Immature Granulocytes: 0.02 10*3/uL (ref 0.00–0.07)
Basophils Absolute: 0.1 10*3/uL (ref 0.0–0.1)
Basophils Relative: 1 %
Eosinophils Absolute: 0.3 10*3/uL (ref 0.0–0.5)
Eosinophils Relative: 3 %
HCT: 46 % (ref 36.0–46.0)
Hemoglobin: 14.7 g/dL (ref 12.0–15.0)
Immature Granulocytes: 0 %
Lymphocytes Relative: 39 %
Lymphs Abs: 3.7 10*3/uL (ref 0.7–4.0)
MCH: 28.2 pg (ref 26.0–34.0)
MCHC: 32 g/dL (ref 30.0–36.0)
MCV: 88.1 fL (ref 80.0–100.0)
Monocytes Absolute: 0.4 10*3/uL (ref 0.1–1.0)
Monocytes Relative: 5 %
Neutro Abs: 4.9 10*3/uL (ref 1.7–7.7)
Neutrophils Relative %: 52 %
Platelets: 358 10*3/uL (ref 150–400)
RBC: 5.22 MIL/uL — ABNORMAL HIGH (ref 3.87–5.11)
RDW: 14 % (ref 11.5–15.5)
WBC: 9.3 10*3/uL (ref 4.0–10.5)
nRBC: 0 % (ref 0.0–0.2)

## 2019-06-06 LAB — COMPREHENSIVE METABOLIC PANEL
ALT: 23 U/L (ref 0–44)
AST: 24 U/L (ref 15–41)
Albumin: 3.8 g/dL (ref 3.5–5.0)
Alkaline Phosphatase: 80 U/L (ref 38–126)
Anion gap: 11 (ref 5–15)
BUN: 11 mg/dL (ref 6–20)
CO2: 19 mmol/L — ABNORMAL LOW (ref 22–32)
Calcium: 8.7 mg/dL — ABNORMAL LOW (ref 8.9–10.3)
Chloride: 107 mmol/L (ref 98–111)
Creatinine, Ser: 0.8 mg/dL (ref 0.44–1.00)
GFR calc Af Amer: >60 mL/min (ref >60)
GFR calc non Af Amer: >60 mL/min (ref >60)
Glucose, Bld: 158 mg/dL — ABNORMAL HIGH (ref 70–99)
Potassium: 4.3 mmol/L (ref 3.5–5.1)
Sodium: 137 mmol/L (ref 135–145)
Total Bilirubin: 0.6 mg/dL (ref 0.3–1.2)
Total Protein: 7 g/dL (ref 6.5–8.1)

## 2019-06-06 LAB — LIPASE, BLOOD: Lipase: 29 U/L (ref 11–51)

## 2019-06-06 NOTE — ED Provider Notes (Signed)
[facility] EMERGENCY DEPARTMENT Provider Note   CSN: [id number] Arrival date & time: 06/06/19  1125     History No chief complaint on file.   Sheryl Mcmillan is a 43 y.o. female.  HPI Patient presents with right calf pain.  Has been there for last 2 to 3 days.  Dull.  Worse with movements and certain positions.  Worse with walking.  No trauma.  No injury.  States does not feel swollen.  Does not hurt in the ankle.  No trauma. Also complaining of left upper abdominal pain.  Has been there for months and comes and goes.  States she has had some gallbladder issues before but then that went away.  No nausea or vomiting.  No diarrhea.  Patient gets crampy at times. At times also feels some chest pain and shortness of breath.  States she woke up at night with episodes of the left-sided chest pain.  No fevers.  No weight loss.    Past Medical History:  Diagnosis Date  . Arthritis   . Hemorrhoids   . Migraines   . Obesity   . PCOS (polycystic ovarian syndrome)     There are no problems to display for this patient.   Past Surgical History:  Procedure Laterality Date  . COSMETIC SURGERY    . DILATION AND CURETTAGE OF UTERUS    . HEMORRHOID SURGERY       OB History   No obstetric history on file.     History reviewed. No pertinent family history.  Social History   Tobacco Use  . Smoking status: Current Every Day Smoker    Packs/day: 0.50    Years: 10.00    Pack years: 5.00    Types: Cigarettes  . Smokeless tobacco: Never Used  Substance Use Topics  . Alcohol use: No  . Drug use: No    Home Medications Prior to Admission medications   Medication Sig Start Date End Date Taking? Authorizing Provider  amoxicillin (AMOXIL) 500 MG capsule Take 1 capsule (500 mg total) by mouth 3 (three) times daily. 07/16/17   Etta Quill, NP  naproxen (NAPROSYN) 375 MG tablet Take 1 tablet (375 mg total) by mouth 2 (two) times daily. 07/16/17   Etta Quill, NP     Allergies    Patient has no known allergies.  Review of Systems   Review of Systems  Constitutional: Negative for appetite change.  HENT: Negative for congestion.   Respiratory: Positive for shortness of breath.   Cardiovascular: Positive for chest pain.  Gastrointestinal: Positive for abdominal pain.  Genitourinary: Negative for dyspareunia.  Musculoskeletal: Negative for back pain.       Right calf pain.  Skin: Negative for rash.  Neurological: Negative for weakness.    Physical Exam Updated Vital Signs BP (!) 165/107 (BP Location: Right Arm)   Pulse (!) 112   Temp 98.7 F (37.1 C) (Oral)   Resp 20   Ht 5\' 11"  (1.803 m)   Wt 136.1 kg   LMP 05/29/2019 (Exact Date)   SpO2 98%   BMI 41.84 kg/m   Physical Exam Vitals and nursing note reviewed.  HENT:     Head: Normocephalic.  Eyes:     Pupils: Pupils are equal, round, and reactive to light.  Cardiovascular:     Rate and Rhythm: Regular rhythm.  Pulmonary:     Breath sounds: No wheezing, rhonchi or rales.  Abdominal:     Comments: Left upper quadrant tenderness without  rebound or guarding.  Musculoskeletal:     Comments: Good range of motion right ankle.  Some tenderness over right calf.  No edema.  No edema either lower extremity.  No erythema.  Sensation intact distally.  Neurological:     Mental Status: She is alert.     ED Results / Procedures / Treatments   Labs (all labs ordered are listed, but only abnormal results are displayed) Labs Reviewed  URINALYSIS, ROUTINE W REFLEX MICROSCOPIC  COMPREHENSIVE METABOLIC PANEL  LIPASE, BLOOD  CBC WITH DIFFERENTIAL/PLATELET    EKG None  Radiology No results found.  Procedures Procedures (including critical care time)  Medications Ordered in ED Medications - No data to display  ED Course  I have reviewed the triage vital signs and the nursing notes.  Pertinent labs & imaging results that were available during my care of the patient were reviewed  by me and considered in my medical decision making (see chart for details).    MDM Rules/Calculators/A&P                       Patient with right calf pain and abdominal pain.  Ultrasound reassuring and calf.  No trauma.  Likely muscular.  Symptomatic treatment. Also upper abdominal pain.  Comes and goes.  Lab work reassuring.  However does have large gallstone.  Does not appear to be cholecystitis.  Potentially could be biliary colic.  Will have follow-up with general surgery as an outpatient. Final Clinical Impression(s) / ED Diagnoses Final diagnoses:  None    Rx / DC Orders ED Discharge Orders    None       Davonna Belling, MD 06/06/19 1343

## 2019-06-06 NOTE — Discharge Instructions (Addendum)
Tylenol may help with the calf pain.  Follow-up with the surgeons for your large gallstone.

## 2019-06-06 NOTE — ED Triage Notes (Signed)
Pt c/o R leg pain x 3 days, LUQ pain x "months", wanted to have checked today, reports no pcp. Pt denies N/V/D. Pt denies LUQ pain at this time

## 2019-06-06 NOTE — ED Notes (Signed)
Urine specimen cup provided.

## 2019-06-06 NOTE — ED Notes (Signed)
Pt transported to [place] prior to EKG and was in stable condition.

## 2019-06-06 NOTE — ED Notes (Signed)
Pt on monitor 

## 2019-06-06 NOTE — ED Notes (Signed)
Patient transported to Ultrasound 

## 2020-05-02 ENCOUNTER — Other Ambulatory Visit: Payer: Self-pay

## 2020-05-02 DIAGNOSIS — N6320 Unspecified lump in the left breast, unspecified quadrant: Secondary | ICD-10-CM

## 2020-05-05 ENCOUNTER — Ambulatory Visit: Payer: Self-pay | Admitting: *Deleted

## 2020-05-05 ENCOUNTER — Other Ambulatory Visit: Payer: Self-pay

## 2020-05-05 VITALS — BP 140/94 | Wt 307.7 lb

## 2020-05-05 DIAGNOSIS — Z1239 Encounter for other screening for malignant neoplasm of breast: Secondary | ICD-10-CM

## 2020-05-05 DIAGNOSIS — N644 Mastodynia: Secondary | ICD-10-CM

## 2020-05-05 DIAGNOSIS — N6325 Unspecified lump in the left breast, overlapping quadrants: Secondary | ICD-10-CM

## 2020-05-05 NOTE — Patient Instructions (Addendum)
Explained breast self awareness with [employer]. Patient did not need a Pap smear today due to last Pap smear was in 2019 per patient. Let her know [facility]CP will cover Pap smears every 3 years unless has a history of abnormal Pap smears. Referred patient to the [facility] for a diagnostic mammogram. Appointment scheduled Tuesday, May 17, 2020 at [place]. Patient aware of appointment and will be there. Patient is a current smoker. Discussed smoking cessation with patient. Referred to the [facility] Quitline and gave resources to the free smoking cessation classes at [facility]. [place] verbalized understanding.  Orris Perin, Arvil Chaco, RN 11:54 AM

## 2020-05-05 NOTE — Progress Notes (Signed)
Ms. CLELLA MCKEEL is a 44 y.o. female who presents to [facility] clinic today with complaint of left outer breast lump x 4-5 months that per patient has increased in size. Patient complained of left outer breast pain that comes and goes. Patient rates the pain at a 4-5 out of 10.    Pap Smear: Pap not smear completed today. Last Pap smear was 2019 at [facility] clinic and was normal per patient. Per patient has history of an abnormal Pap smear in 2004 that a colposcopy was completed for follow-up. Patient states that all Pap smears have been normal since colposcopy and has had at least three normal Pap smears. Last Pap smear result is not available in Epic.   Physical exam: Breasts Breasts symmetrical. No skin abnormalities bilateral breasts. No nipple retraction bilateral breasts. No nipple discharge bilateral breasts. No lymphadenopathy. No lumps palpated right breast. Palpated a lump within the left breast at 3 o'clock 5 cm from the nipple. Complaints of left outer breast tenderness on exam.   Pelvic/Bimanual Pap is not indicated today per BCCCP guidelines.   Smoking History: Patient is a current smoker. Discussed smoking cessation with patient. Referred to the [facility] Quitline and gave resources to the free smoking cessation classes at [facility].   Patient Navigation: Patient education provided. Access to services provided for patient through BCCCP program.   Breast and Cervical Cancer Risk Assessment: Patient does not have family history of breast cancer, known genetic mutations, or radiation treatment to the chest before age 36. Per patient has history of cervical dysplasia. Patient has no history of being immunocompromised or DES exposure in-utero.  Risk Assessment    Risk Scores      05/05/2020   Last edited by: Demetrius Revel, LPN   5-year risk: 0.6 %   Lifetime risk: 8 %          A: BCCCP exam without pap smear Complaint of left outer breast lump and pain.  P: Referred  patient to the [facility] for a diagnostic mammogram. Appointment scheduled Tuesday, May 17, 2020 at [place].  Loletta Parish, RN 05/05/2020 11:54 AM

## 2020-05-10 ENCOUNTER — Other Ambulatory Visit: Payer: Self-pay | Admitting: Obstetrics and Gynecology

## 2020-05-10 ENCOUNTER — Ambulatory Visit
Admission: RE | Admit: 2020-05-10 | Discharge: 2020-05-10 | Disposition: A | Discharge status: Home / Self Care | Payer: No Typology Code available for payment source | Source: Ambulatory Visit | Attending: Obstetrics and Gynecology | Admitting: Obstetrics and Gynecology

## 2020-05-10 ENCOUNTER — Other Ambulatory Visit: Payer: Self-pay

## 2020-05-10 DIAGNOSIS — N6320 Unspecified lump in the left breast, unspecified quadrant: Secondary | ICD-10-CM

## 2020-05-17 ENCOUNTER — Other Ambulatory Visit: Payer: Self-pay

## 2020-05-18 ENCOUNTER — Ambulatory Visit
Admission: RE | Admit: 2020-05-18 | Discharge: 2020-05-18 | Disposition: A | Discharge status: Home / Self Care | Payer: No Typology Code available for payment source | Source: Ambulatory Visit | Attending: Obstetrics and Gynecology | Admitting: Obstetrics and Gynecology

## 2020-05-18 ENCOUNTER — Other Ambulatory Visit: Payer: Self-pay

## 2020-05-18 DIAGNOSIS — N6320 Unspecified lump in the left breast, unspecified quadrant: Secondary | ICD-10-CM

## 2020-05-24 ENCOUNTER — Telehealth: Payer: Self-pay | Admitting: Oncology

## 2020-05-24 NOTE — Telephone Encounter (Signed)
Spoke to patient to confirm afternoon [facility] appointment for 12/8, packet will be mailed

## 2020-05-26 ENCOUNTER — Encounter: Payer: Self-pay | Admitting: *Deleted

## 2020-05-26 DIAGNOSIS — C50412 Malignant neoplasm of upper-outer quadrant of left female breast: Secondary | ICD-10-CM

## 2020-05-31 NOTE — Progress Notes (Signed)
[place]  [phone] Fax:([phone]) [phone]     ID: GERALDYN SHAIN DOB: 1976/01/26  MR#: [id number]  JHE#:[id number]  Patient Care Team: Patient, No Pcp Per as PCP - General (General Practice) [name], RN as Oncology Nurse Navigator [name], RN as Oncology Nurse Navigator [name], MD as Consulting Physician (General Surgery) Devon Pretty, [name], MD as Consulting Physician (Oncology) [name], MD as Consulting Physician (Radiation Oncology) [name], MD OTHER MD:  CHIEF COMPLAINT: estrogen receptor negative, Her2 positive breast cancer  CURRENT TREATMENT: Neoadjuvant chemotherapy   HISTORY OF CURRENT ILLNESS: Sheryl Mcmillan presented with a palpable lump in the outer left breast, which she initially felt approximately 6 months prior. She waited to seek medical attention because she had no insurance.  Eventually she enrolled in the [facility] program and underwent bilateral diagnostic mammography with tomography and left breast ultrasonography at [facility] on 05/10/2020 showing: breast density category B; vaguely palpable 2.5 cm mass in left breast at 1:30; no pathologic left axillary lymphadenopathy or evidence of right breast malignancy.  Accordingly on 05/18/2020 she proceeded to biopsy of the left breast area in question. The pathology from this procedure ([id number]) showed: invasive ductal carcinoma, grade 3. Prognostic indicators significant for: estrogen receptor, 40% positive with weak staining intensity and progesterone receptor, 0% negative. Proliferation marker Ki67 at 50%. HER2 positive by immunohistochemistry (3+).  The patient's subsequent history is as detailed below.   INTERVAL HISTORY: Sheryl Mcmillan was evaluated in the multidisciplinary breast cancer clinic on 06/01/2020 accompanied by daughter Sheryl Mcmillan. Her case was also presented at the multidisciplinary breast cancer conference on the same day. At that time  a preliminary plan was proposed: Neoadjuvant chemotherapy, lumpectomy with sentinel lymph node sampling, adjuvant radiation, antiestrogens and genetics   REVIEW OF SYSTEMS: On the provided questionnaire, Sheryl Mcmillan reports stabbing and cramping left-sided pain, tinnitus, sinus problems, shortness of breath with climbing stairs, nausea/vomiting, heartburn, abdominal pain, rectal bleeding, palpable breast lump with pain, back pain, arthritis, headaches, forgetfulness, and anxiety. The patient denies unusual headaches, visual changes, stiff neck, dizziness, or gait imbalance. There has been no cough, phlegm production, or pleurisy, no chest pain or pressure, and no change in bowel or bladder habits. The patient denies fever, rash, bleeding, unexplained fatigue or unexplained weight loss. A detailed review of systems was otherwise entirely negative.   COVID 19 VACCINATION STATUS:    PAST MEDICAL HISTORY: Past Medical History:  Diagnosis Date  . Arthritis   . Breast cancer ([place])   . Family history of non-Hodgkin's lymphoma 06/01/2020  . Hemorrhoids   . Migraines   . Obesity   . PCOS (polycystic ovarian syndrome)     PAST SURGICAL HISTORY: Past Surgical History:  Procedure Laterality Date  . COSMETIC SURGERY    . DILATION AND CURETTAGE OF UTERUS    . HEMORRHOID SURGERY      FAMILY HISTORY: Family History  Problem Relation Age of Onset  . Hypertension Mother   . Diabetes Mother   . Cancer Mother 52       unknown type  . Non-Hodgkin's lymphoma Brother 50   She has no information on her father. Her mother died at age 17 from stage IV cancer. The primary cancer was unknown, but it was not breast.  [name] has two half brothers (through her mother), one of which was diagnosed with non-Hodgkin's lymphoma. There is no family history of breast, ovarian, or prostate cancer to her knowledge.  GYNECOLOGIC HISTORY:  No LMP recorded. Menarche: 44 years old Age at first live birth: 44 years old [place]  P 1 LMP 05/02/2020 Contraceptive: never used HRT n/a  Hysterectomy? no BSO? 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 She is single. Her significant other [name] is a Freight forwarder. She lives at home with [name], her daughter Sheryl Mcmillan,  [name]'s daughter [name] and her 44-month-old daughter Philemon Kingdom. Daughter Sheryl Mcmillan, age 74, is a Chief Operating Officer at [employer] here in [place]. Tamirah attends [employer].    ADVANCED DIRECTIVES: She intends to name her daughter Sheryl Mcmillan as her HCPOA.  At the 06/01/2020 visit the patient was given the appropriate documents to complete and notarized at her discretion.[name]   HEALTH MAINTENANCE: Social History   Tobacco Use  . Smoking status: Current Every Day Smoker    Packs/day: 0.50    Years: 10.00    Pack years: 5.00    Types: Cigarettes  . Smokeless tobacco: Never Used  Vaping Use  . Vaping Use: Never used  Substance Use Topics  . Alcohol use: Yes    Comment: occasionally  . Drug use: Yes    Types: Marijuana     Colonoscopy: 2018  PAP: 2018  Bone density: n/a (age)   Allergies  Allergen Reactions  . Crest Tc-Baking Soda [Sodium Fluoride] Hives    Baking Soda    Current Outpatient Medications  Medication Sig Dispense Refill  . acetaminophen (TYLENOL) 325 MG tablet Take 650 mg by mouth every 6 (six) hours as needed.    [name] DAILY MULTIPLE VITAMINS PO Take by mouth.    . Homeopathic Products (LIVER SUPPORT SL) Place under the tongue.    [name] Phenylephrine-Aspirin (ALKA-SELTZER PLUS SINUS PO) Take by mouth.     No current facility-administered medications for this visit.    OBJECTIVE: White woman who appears stated age  44:   06/01/20 1322  BP: (!) 143/83  Pulse: 89  Resp: 18  Temp: 99.6 F (37.6 C)  SpO2: 99%     Body mass index is 42.96 kg/m.   Wt Readings from Last 3 Encounters:  06/01/20 (!) 308 lb (139.7 kg)  05/05/20 (!) 307 lb 11.2 oz  (139.6 kg)  06/06/19 300 lb (136.1 kg)      ECOG FS:1 - Symptomatic but completely ambulatory  Ocular: Sclerae unicteric, pupils round and equal Ear-nose-throat: Wearing a mask Lymphatic: No cervical or supraclavicular adenopathy Lungs no rales or rhonchi Heart regular rate and rhythm Abd soft, nontender, positive bowel sounds MSK no focal spinal tenderness, no joint edema Neuro: non-focal, well-oriented, appropriate affect Breasts: The right breast is unremarkable.  In the left breast laterally there is a mass which is best palpated with the palm moving in towards the nipple.  It is not well-defined otherwise.  There are no nipple or skin changes of concern.  Both axillae are benign.  LAB RESULTS:  CMP     Component Value Date/Time   NA 139 06/01/2020 1232   K 4.0 06/01/2020 1232   CL 107 06/01/2020 1232   CO2 22 06/01/2020 1232   GLUCOSE 150 (H) 06/01/2020 1232   BUN 12 06/01/2020 1232   CREATININE 0.91 06/01/2020 1232   CALCIUM 9.5 06/01/2020 1232   PROT 7.1 06/01/2020 1232   ALBUMIN 3.6 06/01/2020 1232   AST 15 06/01/2020 1232   ALT 19 06/01/2020 1232   ALKPHOS 83 06/01/2020 1232   BILITOT  0.3 06/01/2020 1232   GFRNONAA >60 06/01/2020 1232   GFRAA >60 06/06/2019 1205    No results found for: TOTALPROTELP, ALBUMINELP, A1GS, A2GS, BETS, BETA2SER, GAMS, MSPIKE, SPEI  Lab Results  Component Value Date   WBC 10.9 (H) 06/01/2020   NEUTROABS 6.5 06/01/2020   HGB 13.5 06/01/2020   HCT 41.4 06/01/2020   MCV 85.4 06/01/2020   PLT 377 06/01/2020    No results found for: LABCA2  No components found for: [id number]  No results for input(s): INR in the last 168 hours.  No results found for: LABCA2  No results found for: ATF573  No results found for: UKG254  No results found for: YHC623  No results found for: CA2729  No components found for: HGQUANT  No results found for: CEA1 / No results found for: CEA1   No results found for: AFPTUMOR  No results found  for: CHROMOGRNA  No results found for: KPAFRELGTCHN, LAMBDASER, KAPLAMBRATIO (kappa/lambda light chains)  No results found for: HGBA, HGBA2QUANT, HGBFQUANT, HGBSQUAN (Hemoglobinopathy evaluation)   No results found for: LDH  No results found for: IRON, TIBC, IRONPCTSAT (Iron and TIBC)  No results found for: FERRITIN  Urinalysis    Component Value Date/Time   COLORURINE YELLOW 06/06/2019 [place] 06/06/2019 1205   LABSPEC >1.030 (H) 06/06/2019 1205   PHURINE 5.5 06/06/2019 1205   GLUCOSEU NEGATIVE 06/06/2019 1205   HGBUR TRACE (A) 06/06/2019 1205   BILIRUBINUR NEGATIVE 06/06/2019 1205   KETONESUR NEGATIVE 06/06/2019 1205   PROTEINUR NEGATIVE 06/06/2019 1205   UROBILINOGEN 1.0 05/09/2011 1805   NITRITE NEGATIVE 06/06/2019 1205   LEUKOCYTESUR NEGATIVE 06/06/2019 1205     STUDIES: US BREAST LTD UNI LEFT INC AXILLA  Result Date: 05/10/2020 CLINICAL DATA:  44 year old presenting with a palpable lump in the OUTER LEFT breast which she initially felt approximately 6 months ago. This is the patient's initial baseline mammogram. EXAM: DIGITAL DIAGNOSTIC BILATERAL MAMMOGRAM WITH CAD AND TOMO ULTRASOUND LEFT BREAST COMPARISON:  None. ACR Breast Density Category b: There are scattered areas of fibroglandular density. FINDINGS: Tomosynthesis and synthesized full field CC and MLO views of both breasts were obtained. Tomosynthesis and synthesized spot compression tangential view of the area of concern in the LEFT breast and tomosynthesis and synthesized spot compression CC and MLO views of a finding in the LEFT breast were also obtained. Mammographic images were processed with CAD. LEFT: An isodense to slightly hyperdense mass with irregular margins is present in the OUTER LEFT breast at MIDDLE depth, corresponding to the palpable concern. The mass has a maximum measurement of approximately 3 cm. There is associated subtle architectural distortion. There are no associated  suspicious calcifications. No suspicious findings elsewhere in the LEFT breast. Targeted ultrasound is performed, showing an irregular hypoechoic mass with vague and angular margins at the 1:30 o'clock position approximately 8 cm from the nipple at MIDDLE depth, measuring approximately 2.5 x 1.7 x 2.4 cm, demonstrating mixed posterior characteristics and demonstrating internal power Doppler flow. Sonographic evaluation of the LEFT axilla demonstrates no pathologic lymphadenopathy. RIGHT: No findings suspicious for malignancy. On correlative physical exam, there is a vague palpable mass deep in the UPPER INNER LEFT breast. IMPRESSION: 1. Highly suspicious 2.5 cm mass involving the UPPER OUTER QUADRANT of the LEFT breast. 2. No pathologic LEFT axillary lymphadenopathy. 3. No mammographic evidence of malignancy involving the RIGHT breast. RECOMMENDATION: Ultrasound-guided core needle biopsy of the LEFT breast mass. The ultrasound core needle biopsy procedure was discussed with the  patient and her questions were answered. She wishes to proceed and the biopsy has been scheduled for Wednesday, November 24 at 12:45 p.m. I have discussed the findings and recommendations with the patient. BI-RADS CATEGORY  5: Highly suggestive of malignancy. Electronically Signed   By: Evangeline Dakin M.D.   On: 05/10/2020 15:48   MS DIGITAL DIAG TOMO BILAT  Result Date: 05/10/2020 CLINICAL DATA:  44 year old presenting with a palpable lump in the OUTER LEFT breast which she initially felt approximately 6 months ago. This is the patient's initial baseline mammogram. EXAM: DIGITAL DIAGNOSTIC BILATERAL MAMMOGRAM WITH CAD AND TOMO ULTRASOUND LEFT BREAST COMPARISON:  None. ACR Breast Density Category b: There are scattered areas of fibroglandular density. FINDINGS: Tomosynthesis and synthesized full field CC and MLO views of both breasts were obtained. Tomosynthesis and synthesized spot compression tangential view of the area of concern in  the LEFT breast and tomosynthesis and synthesized spot compression CC and MLO views of a finding in the LEFT breast were also obtained. Mammographic images were processed with CAD. LEFT: An isodense to slightly hyperdense mass with irregular margins is present in the OUTER LEFT breast at MIDDLE depth, corresponding to the palpable concern. The mass has a maximum measurement of approximately 3 cm. There is associated subtle architectural distortion. There are no associated suspicious calcifications. No suspicious findings elsewhere in the LEFT breast. Targeted ultrasound is performed, showing an irregular hypoechoic mass with vague and angular margins at the 1:30 o'clock position approximately 8 cm from the nipple at MIDDLE depth, measuring approximately 2.5 x 1.7 x 2.4 cm, demonstrating mixed posterior characteristics and demonstrating internal power Doppler flow. Sonographic evaluation of the LEFT axilla demonstrates no pathologic lymphadenopathy. RIGHT: No findings suspicious for malignancy. On correlative physical exam, there is a vague palpable mass deep in the UPPER INNER LEFT breast. IMPRESSION: 1. Highly suspicious 2.5 cm mass involving the UPPER OUTER QUADRANT of the LEFT breast. 2. No pathologic LEFT axillary lymphadenopathy. 3. No mammographic evidence of malignancy involving the RIGHT breast. RECOMMENDATION: Ultrasound-guided core needle biopsy of the LEFT breast mass. The ultrasound core needle biopsy procedure was discussed with the patient and her questions were answered. She wishes to proceed and the biopsy has been scheduled for Wednesday, November 24 at 12:45 p.m. I have discussed the findings and recommendations with the patient. BI-RADS CATEGORY  5: Highly suggestive of malignancy. Electronically Signed   By: Evangeline Dakin M.D.   On: 05/10/2020 15:48   MM CLIP PLACEMENT LEFT  Result Date: 05/18/2020 CLINICAL DATA:  Confirmation of clip placement after ultrasound-guided core needle biopsy  of a 2.5 cm mass involving the UPPER OUTER QUADRANT of the LEFT breast. EXAM: 2D AND TOMOSYNTHESIS DIAGNOSTIC LEFT MAMMOGRAM POST ULTRASOUND BIOPSY COMPARISON:  Previous exam(s). FINDINGS: Tomosynthesis and synthesized full field CC and mediolateral images were obtained following ultrasound guided biopsy of a mass involving the UPPER OUTER QUADRANT of the LEFT breast. The ribbon shaped tissue marking clip is appropriately positioned within the biopsied mass in the [place]. Expected post biopsy changes are present without evidence of hematoma. IMPRESSION: Appropriate positioning of the ribbon shaped tissue marking clip within the biopsied mass in the [place] of the LEFT breast. Final Assessment: Post Procedure Mammograms for Marker Placement Electronically Signed   By: Evangeline Dakin M.D.   On: 05/18/2020 13:30   [place] LT BREAST BX W LOC DEV 1ST LESION IMG BX SPEC US GUIDE  Addendum Date: 05/23/2020   ADDENDUM REPORT: 05/23/2020 14:54 ADDENDUM:  Pathology revealed GRADE III INVASIVE DUCTAL CARCINOMA of the LEFT breast, 1:30 o'clock, 8cmfn, upper outer quadrant. This was found to be concordant by Dr. Peggye Fothergill. Pathology results were discussed with the patient by telephone. The patient reported doing well after the biopsy with tenderness at the site. Post biopsy instructions and care were reviewed and questions were answered. The patient was encouraged to call [facility] for any additional concerns. The patient was referred to [facility] at [facility] on June 01, 2020. Pathology results reported by Stacie Acres RN on 05/23/2020. Electronically Signed   By: Evangeline Dakin M.D.   On: 05/23/2020 14:54   Result Date: 05/23/2020 CLINICAL DATA:  44 year old with a palpable 2.5 cm mass in the UPPER OUTER QUADRANT of the LEFT breast at the 1:30 o'clock position 8 cm from the nipple. EXAM:  ULTRASOUND GUIDED LEFT BREAST CORE NEEDLE BIOPSY COMPARISON:  Previous exam(s). PROCEDURE: I met with the patient and we discussed the procedure of ultrasound-guided biopsy, including benefits and alternatives. We discussed the high likelihood of a successful procedure. We discussed the risks of the procedure, including infection, bleeding, tissue injury, clip migration, and inadequate sampling. Informed written consent was given. The usual time-out protocol was performed immediately prior to the procedure. Lesion quadrant: UPPER OUTER QUADRANT. Using sterile technique with chlorhexidine as skin antisepsis 1% lidocaine and 1% lidocaine with epinephrine as local anesthetic, under direct ultrasound visualization, a 12 gauge Bard Marquee core needle device placed through an 11 gauge introducer needle was used to perform biopsy of the mass in the UPPER OUTER QUADRANT of the LEFT breast using a lateral approach. At the conclusion of the procedure a ribbon shaped tissue marker clip was deployed into the biopsy cavity. Follow up 2 view mammogram was performed and dictated separately. IMPRESSION: Ultrasound guided biopsy of a 2.5 cm mass involving the UPPER OUTER QUADRANT of the LEFT breast. No apparent complications. Electronically Signed: By: Evangeline Dakin M.D. On: 05/18/2020 13:31     ELIGIBLE FOR AVAILABLE RESEARCH PROTOCOL: AET  ASSESSMENT: 44 y.o. [place] woman status post left breast upper outer quadrant biopsy 05/18/2020 for a clinical T2N0, stage IIA invasive ductal carcinoma, grade 3, estrogen receptor weakly positive, progesterone receptor negative, with an MIB-1 of 50%, and HER-2 positive by immunohistochemistry  (1) genetics testing pending  (2) neoadjuvant chemotherapy to consist of carboplatin and docetaxel together with trastuzumab and Pertuzumab every 3 weeks x 6  (3) trastuzumab to be continued to complete 1 year  (4) definitive surgery to follow  (5) adjuvant radiation  (6)  antiestrogens  PLAN: I met today with Sheryl Mcmillan to review her new diagnosis. Specifically we discussed the biology of her breast cancer, its diagnosis, staging, treatment  options and prognosis. We first reviewed the fact that cancer is not one disease but more than 100 different diseases and that it is important to keep them separate-- otherwise when friends and relatives discuss their own cancer experiences with [facility] confusion can result. Similarly we explained that if breast cancer spreads to the bone or liver, the patient would not have bone cancer or liver cancer, but breast cancer in the bone and breast cancer in the liver: one cancer in three places-- not 3 different cancers which otherwise would have to be treated in 3 different ways.  We discussed the difference between local and systemic therapy. In terms of loco-regional treatment, lumpectomy plus radiation is equivalent to mastectomy as  far as survival is concerned. For this reason, and because the cosmetic results are generally superior, we recommend breast conserving surgery.   We also noted that in terms of sequencing of treatments, whether systemic therapy or surgery is done first does not affect the ultimate outcome.  This is relevant to Sheryl Mcmillan's case since we believe she will benefit from neoadjuvant treatment particularly as this will give [place] additional prognostic information.  We then discussed the rationale for systemic therapy. There is some risk that this cancer may have already spread to other parts of her body. Patients frequently ask at this point about bone scans, CAT scans and PET scans to find out if they have occult breast cancer somewhere else. The problem is that in early stage disease we are much more likely to find false positives then true cancers and this would expose the patient to unnecessary procedures as well as unnecessary radiation. Scans cannot answer the question the patient really would like to know, which is whether  she has microscopic disease elsewhere in her body. For those reasons we do not recommend them.  Of course we would proceed to aggressive evaluation of any symptoms that might suggest metastatic disease, but that is not the case here.  Next we went over the options for systemic therapy which are anti-estrogens, anti-HER-2 immunotherapy, and chemotherapy. Elma is a good candidate for anti-HER-2 combined modality therapy which is standard in this situation.  She may later benefit to a small extent from antiestrogens.  Today we discussed carboplatin, docetaxel, trastuzumab and Pertuzumab.  She understands this is a combination of chemotherapy and immunotherapy given by port every 3 weeks for 6 doses.  She will then continue the immunotherapy to a total of 1 year.  She has a good understanding of the possible toxicities side effects and complications of these agents and she will meet with our chemotherapy teaching nurse for further instruction.  We are hoping to start treatment 06/16/2020.  While we normally obtain a baseline MRI that will not be possible in this case because the patient would need an open MRI which is only available through a facility that does not accept her insurance.  We will use ultrasound as the baseline study to follow.  Of course she will have an echocardiogram and port placement prior to the start of treatment  She will see me again 06/10/2020 to make sure we get someplace before we start  Cristianna has a good understanding of the overall plan. She agrees with it. She knows the goal of treatment in her case is cure. She will call with any problems that may develop before her next visit here.  Total encounter time 65 minutes.Sarajane Jews C. Ramanda Paules, MD 06/01/2020 5:50 PM Medical Oncology and Hematology [facility] [place], [place] [address] Tel. [phone]    Fax. [phone]   This document serves as a record of services personally performed by  Lurline Del, MD. It was created on his behalf by Wilburn Mylar, a trained medical scribe. The creation of this record is based on the scribe's personal observations and the provider's statements to them.   I, Lurline Del MD, have reviewed the above documentation for accuracy and completeness, and I agree with the above.    *Total Encounter Time as defined by the Centers for Medicare and Medicaid Services includes, in addition to the face-to-face time of a patient visit (documented in the note above) non-face-to-face time: obtaining and reviewing outside  history, ordering and reviewing medications, tests or procedures, care coordination (communications with other health care professionals or caregivers) and documentation in the medical record.

## 2020-06-01 ENCOUNTER — Other Ambulatory Visit: Payer: Self-pay | Admitting: *Deleted

## 2020-06-01 ENCOUNTER — Other Ambulatory Visit: Payer: Self-pay

## 2020-06-01 ENCOUNTER — Encounter: Payer: Self-pay | Admitting: Genetic Counselor

## 2020-06-01 ENCOUNTER — Inpatient Hospital Stay: Payer: Medicaid Other | Attending: Oncology | Admitting: Oncology

## 2020-06-01 ENCOUNTER — Inpatient Hospital Stay (HOSPITAL_BASED_OUTPATIENT_CLINIC_OR_DEPARTMENT_OTHER): Payer: Medicaid Other | Admitting: Genetic Counselor

## 2020-06-01 ENCOUNTER — Encounter: Payer: Self-pay | Admitting: Oncology

## 2020-06-01 ENCOUNTER — Ambulatory Visit
Admission: RE | Admit: 2020-06-01 | Discharge: 2020-06-01 | Disposition: A | Discharge status: Home / Self Care | Payer: No Typology Code available for payment source | Source: Ambulatory Visit | Attending: Radiation Oncology | Admitting: Radiation Oncology

## 2020-06-01 ENCOUNTER — Ambulatory Visit: Payer: Medicaid Other | Attending: General Surgery | Admitting: Physical Therapy

## 2020-06-01 ENCOUNTER — Inpatient Hospital Stay: Payer: Medicaid Other

## 2020-06-01 ENCOUNTER — Encounter: Payer: Self-pay | Admitting: Physical Therapy

## 2020-06-01 VITALS — BP 143/83 | HR 89 | Temp 99.6°F | Resp 18 | Ht 71.0 in | Wt 308.0 lb

## 2020-06-01 DIAGNOSIS — Z170 Estrogen receptor positive status [ER+]: Secondary | ICD-10-CM

## 2020-06-01 DIAGNOSIS — Z5189 Encounter for other specified aftercare: Secondary | ICD-10-CM | POA: Diagnosis not present

## 2020-06-01 DIAGNOSIS — C50412 Malignant neoplasm of upper-outer quadrant of left female breast: Secondary | ICD-10-CM

## 2020-06-01 DIAGNOSIS — Z79899 Other long term (current) drug therapy: Secondary | ICD-10-CM | POA: Insufficient documentation

## 2020-06-01 DIAGNOSIS — R110 Nausea: Secondary | ICD-10-CM | POA: Insufficient documentation

## 2020-06-01 DIAGNOSIS — Z5112 Encounter for antineoplastic immunotherapy: Secondary | ICD-10-CM | POA: Insufficient documentation

## 2020-06-01 DIAGNOSIS — Z807 Family history of other malignant neoplasms of lymphoid, hematopoietic and related tissues: Secondary | ICD-10-CM | POA: Diagnosis not present

## 2020-06-01 DIAGNOSIS — B379 Candidiasis, unspecified: Secondary | ICD-10-CM | POA: Diagnosis not present

## 2020-06-01 DIAGNOSIS — Z5111 Encounter for antineoplastic chemotherapy: Secondary | ICD-10-CM | POA: Diagnosis present

## 2020-06-01 DIAGNOSIS — F1290 Cannabis use, unspecified, uncomplicated: Secondary | ICD-10-CM | POA: Diagnosis not present

## 2020-06-01 DIAGNOSIS — Z809 Family history of malignant neoplasm, unspecified: Secondary | ICD-10-CM | POA: Insufficient documentation

## 2020-06-01 DIAGNOSIS — E860 Dehydration: Secondary | ICD-10-CM | POA: Diagnosis not present

## 2020-06-01 DIAGNOSIS — R293 Abnormal posture: Secondary | ICD-10-CM | POA: Insufficient documentation

## 2020-06-01 DIAGNOSIS — R197 Diarrhea, unspecified: Secondary | ICD-10-CM | POA: Diagnosis not present

## 2020-06-01 DIAGNOSIS — F17210 Nicotine dependence, cigarettes, uncomplicated: Secondary | ICD-10-CM | POA: Insufficient documentation

## 2020-06-01 HISTORY — DX: Family history of other malignant neoplasms of lymphoid, hematopoietic and related tissues: Z80.7

## 2020-06-01 LAB — CBC WITH DIFFERENTIAL (CANCER CENTER ONLY)
Abs Immature Granulocytes: 0.05 10*3/uL (ref 0.00–0.07)
Basophils Absolute: 0.1 10*3/uL (ref 0.0–0.1)
Basophils Relative: 1 %
Eosinophils Absolute: 0.3 10*3/uL (ref 0.0–0.5)
Eosinophils Relative: 3 %
HCT: 41.4 % (ref 36.0–46.0)
Hemoglobin: 13.5 g/dL (ref 12.0–15.0)
Immature Granulocytes: 1 %
Lymphocytes Relative: 32 %
Lymphs Abs: 3.5 10*3/uL (ref 0.7–4.0)
MCH: 27.8 pg (ref 26.0–34.0)
MCHC: 32.6 g/dL (ref 30.0–36.0)
MCV: 85.4 fL (ref 80.0–100.0)
Monocytes Absolute: 0.6 10*3/uL (ref 0.1–1.0)
Monocytes Relative: 5 %
Neutro Abs: 6.5 10*3/uL (ref 1.7–7.7)
Neutrophils Relative %: 58 %
Platelet Count: 377 10*3/uL (ref 150–400)
RBC: 4.85 MIL/uL (ref 3.87–5.11)
RDW: 14.6 % (ref 11.5–15.5)
WBC Count: 10.9 10*3/uL — ABNORMAL HIGH (ref 4.0–10.5)
nRBC: 0 % (ref 0.0–0.2)

## 2020-06-01 LAB — GENETIC SCREENING ORDER

## 2020-06-01 LAB — CMP (CANCER CENTER ONLY)
ALT: 19 U/L (ref 0–44)
AST: 15 U/L (ref 15–41)
Albumin: 3.6 g/dL (ref 3.5–5.0)
Alkaline Phosphatase: 83 U/L (ref 38–126)
Anion gap: 10 (ref 5–15)
BUN: 12 mg/dL (ref 6–20)
CO2: 22 mmol/L (ref 22–32)
Calcium: 9.5 mg/dL (ref 8.9–10.3)
Chloride: 107 mmol/L (ref 98–111)
Creatinine: 0.91 mg/dL (ref 0.44–1.00)
GFR, Estimated: >60 mL/min (ref >60)
Glucose, Bld: 150 mg/dL — ABNORMAL HIGH (ref 70–99)
Potassium: 4 mmol/L (ref 3.5–5.1)
Sodium: 139 mmol/L (ref 135–145)
Total Bilirubin: 0.3 mg/dL (ref 0.3–1.2)
Total Protein: 7.1 g/dL (ref 6.5–8.1)

## 2020-06-01 MED ORDER — DEXAMETHASONE 4 MG PO TABS: 8.0000 mg | ORAL_TABLET | Freq: Two times a day (BID) | ORAL | 1 refills | Status: DC

## 2020-06-01 MED ORDER — PROCHLORPERAZINE MALEATE 10 MG PO TABS: 10.0000 mg | ORAL_TABLET | Freq: Four times a day (QID) | ORAL | 1 refills | Status: DC | PRN

## 2020-06-01 MED ORDER — LIDOCAINE-PRILOCAINE 2.5-2.5 % EX CREA: TOPICAL_CREAM | CUTANEOUS | 3 refills | Status: DC

## 2020-06-01 NOTE — Progress Notes (Signed)
[place] Psychosocial Distress Screening Spiritual Care  Met with Ms. Northrup in [facility] to introduce [facility] team/resources, reviewing distress screen per protocol.  The patient scored a 8 on the Psychosocial Distress Thermometer which indicates severe distress. Also assessed for distress and other psychosocial needs. Patient reports stress is down to 3 (mild) after meeting team.  [facility] DISTRESS SCREENING 06/01/2020  Screening Type Initial Screening  Distress experienced in past week (1-10) 8  Practical problem type Insurance;Work/school  Family Problem type Other (comment)  Emotional problem type Nervousness/Anxiety;Adjusting to illness  Physical Problem type Nausea/vomiting  Referral to support programs Yes    Follow up needed: Yes, will place [facility] referral, refer to social work regarding financial concerns and refer to chaplain for follow up support.   Gaylyn Rong Counseling Intern

## 2020-06-01 NOTE — Progress Notes (Signed)
Radiation Oncology         [phone] ________________________________  Multidisciplinary Breast Oncology Clinic [facility]) Initial Outpatient Consultation  Name: Sheryl Mcmillan MRN: [id number]  Date: 06/01/2020  DOB: 03/27/1976  QJ:JHERDEY, No Pcp Per  [name], MD   REFERRING PHYSICIAN: [name], MD  DIAGNOSIS: The encounter diagnosis was Malignant neoplasm of upper-outer quadrant of left breast in female, estrogen receptor positive ([place]).   Stage T2A, Nx, Mx, Left Breast UOQ, Invasive Ductal Carcinoma, ER+ / PR- / Her2+, Grade 3    ICD-10-CM   1. Malignant neoplasm of upper-outer quadrant of left breast in female, estrogen receptor positive ([place])  C50.412    Z17.0     HISTORY OF PRESENT ILLNESS::Sheryl Mcmillan is a 44 y.o. female who is presenting to the office today for evaluation of her newly diagnosed breast cancer. She is accompanied by her daughter. She is doing well overall.   She underwent bilateral diagnostic mammography with tomography and left breast ultrasonography at [facility] on 05/10/2020 for evaluation of six-month history of palpable lump in the outer left breast. Results showed a highly suspicious 2.5 cm mass involving the upper outer quadrant of the left breast. There was no pathologic left axillary lymphadenopathy. No mammographic evidence of malignancy involving the right breast.   Biopsy on 05/18/2020 showed: grade 3 invasive ductal carcinoma. Prognostic indicators were significant for: estrogen receptor, 40% positive with a weak staining intensity and progesterone receptor, 0% negative. Proliferation marker Ki67 at 50%. HER2 positive.  Menarche: 44 years old Age at first live birth: 44 years old GP: 1 LMP: 05/02/2020 Contraceptive: None HRT: None   The patient was referred today for presentation in the multidisciplinary conference.  Radiology studies and pathology slides were presented there for review and discussion of treatment  options.  A consensus was discussed regarding potential next steps.  PREVIOUS RADIATION THERAPY: No  PAST MEDICAL HISTORY:  Past Medical History:  Diagnosis Date  . Arthritis   . Breast cancer ([place])   . Family history of non-Hodgkin's lymphoma 06/01/2020  . Hemorrhoids   . Migraines   . Obesity   . PCOS (polycystic ovarian syndrome)     PAST SURGICAL HISTORY: Past Surgical History:  Procedure Laterality Date  . COSMETIC SURGERY    . DILATION AND CURETTAGE OF UTERUS    . HEMORRHOID SURGERY      FAMILY HISTORY:  Family History  Problem Relation Age of Onset  . Hypertension Mother   . Diabetes Mother   . Cancer Mother 21       unknown type  . Non-Hodgkin's lymphoma Brother 51    SOCIAL HISTORY:  Social History   Socioeconomic History  . Marital status: Single    Spouse name: Not on file  . Number of children: 1  . Years of education: Not on file  . Highest education level: Some college, no degree  Occupational History  . Not on file  Tobacco Use  . Smoking status: Current Every Day Smoker    Packs/day: 0.50    Years: 10.00    Pack years: 5.00    Types: Cigarettes  . Smokeless tobacco: Never Used  Vaping Use  . Vaping Use: Never used  Substance and Sexual Activity  . Alcohol use: Yes    Comment: occasionally  . Drug use: Yes    Types: Marijuana  . Sexual activity: Yes    Birth control/protection: None  Other Topics Concern  . Not on file  Social  History Narrative  . Not on file   Social Determinants of Health   Financial Resource Strain:   . Difficulty of Paying Living Expenses: Not on file  Food Insecurity:   . Worried About Charity fundraiser in the Last Year: Not on file  . Ran Out of Food in the Last Year: Not on file  Transportation Needs: No Transportation Needs  . Lack of Transportation (Medical): No  . Lack of Transportation (Non-Medical): No  Physical Activity:   . Days of Exercise per Week: Not on file  . Minutes of Exercise per  Session: Not on file  Stress:   . Feeling of Stress : Not on file  Social Connections:   . Frequency of Communication with Friends and Family: Not on file  . Frequency of Social Gatherings with Friends and Family: Not on file  . Attends Religious Services: Not on file  . Active Member of Clubs or Organizations: Not on file  . Attends Archivist Meetings: Not on file  . Marital Status: Not on file    ALLERGIES:  Allergies  Allergen Reactions  . Crest Tc-Baking Soda [Sodium Fluoride] Hives    Baking Soda    MEDICATIONS:  Current Outpatient Medications  Medication Sig Dispense Refill  . acetaminophen (TYLENOL) 325 MG tablet Take 650 mg by mouth every 6 (six) hours as needed.    [name] DAILY MULTIPLE VITAMINS PO Take by mouth.    . Homeopathic Products (LIVER SUPPORT SL) Place under the tongue.    [name] Phenylephrine-Aspirin (ALKA-SELTZER PLUS SINUS PO) Take by mouth.     No current facility-administered medications for this encounter.    REVIEW OF SYSTEMS: A 10+ POINT REVIEW OF SYSTEMS WAS OBTAINED including neurology, dermatology, psychiatry, cardiac, respiratory, lymph, extremities, GI, GU, musculoskeletal, constitutional, reproductive, HEENT. On the provided form, she reports left-sided back pain described as cramping and stabbing, tinnitus, sinus problems, shortness of breath when using stairs, nausea, vomiting, heartburn, abdominal pain, rectal bleeding, pain in left breast, lump in left breast, arthritis, headaches, forgetfulness, and anxiety. She denies chest pain, dysuria, rash, and any other symptoms.    PHYSICAL EXAM:   Vitals with BMI 06/01/2020  Height 5' 11"   Weight 308 lbs  BMI 49.20  Systolic 100  Diastolic 83  Pulse 89    Lungs are clear to auscultation bilaterally. Heart has regular rate and rhythm. No palpable cervical, supraclavicular, or axillary adenopathy. Abdomen soft, non-tender, normal bowel sounds. Right breast is large and pendulous with no palpable  mass, nipple discharge, or bleeding.  Left breast is large and pendulous with a palpable mass in the lateral aspect that is approximately 2.5 - 3.0 cm in size. There was no nipple discharge or bleeding.  KPS = 90  100 - Normal; no complaints; no evidence of disease. 90   - Able to carry on normal activity; minor signs or symptoms of disease. 80   - Normal activity with effort; some signs or symptoms of disease. 38   - Cares for self; unable to carry on normal activity or to do active work. 60   - Requires occasional assistance, but is able to care for most of his personal needs. 50   - Requires considerable assistance and frequent medical care. 3   - Disabled; requires special care and assistance. 20   - Severely disabled; hospital admission is indicated although death not imminent. 48   - Very sick; hospital admission necessary; active supportive treatment necessary. 10   -  Moribund; fatal processes progressing rapidly. 0     - Dead  Karnofsky DA, Abelmann [place], Craver LS and Burchenal JH [phone]) The use of the nitrogen mustards in the palliative treatment of carcinoma: with particular reference to bronchogenic carcinoma Cancer 1 634-56  LABORATORY DATA:  Lab Results  Component Value Date   WBC 10.9 (H) 06/01/2020   HGB 13.5 06/01/2020   HCT 41.4 06/01/2020   MCV 85.4 06/01/2020   PLT 377 06/01/2020   Lab Results  Component Value Date   NA 139 06/01/2020   K 4.0 06/01/2020   CL 107 06/01/2020   CO2 22 06/01/2020   Lab Results  Component Value Date   ALT 19 06/01/2020   AST 15 06/01/2020   ALKPHOS 83 06/01/2020   BILITOT 0.3 06/01/2020    PULMONARY FUNCTION TEST:   Recent Review Flowsheet Data   There is no flowsheet data to display.     RADIOGRAPHY: US BREAST LTD UNI LEFT INC AXILLA  Result Date: 05/10/2020 CLINICAL DATA:  44 year old presenting with a palpable lump in the OUTER LEFT breast which she initially felt approximately 6 months ago. This is the patient's  initial baseline mammogram. EXAM: DIGITAL DIAGNOSTIC BILATERAL MAMMOGRAM WITH CAD AND TOMO ULTRASOUND LEFT BREAST COMPARISON:  None. ACR Breast Density Category b: There are scattered areas of fibroglandular density. FINDINGS: Tomosynthesis and synthesized full field CC and MLO views of both breasts were obtained. Tomosynthesis and synthesized spot compression tangential view of the area of concern in the LEFT breast and tomosynthesis and synthesized spot compression CC and MLO views of a finding in the LEFT breast were also obtained. Mammographic images were processed with CAD. LEFT: An isodense to slightly hyperdense mass with irregular margins is present in the OUTER LEFT breast at MIDDLE depth, corresponding to the palpable concern. The mass has a maximum measurement of approximately 3 cm. There is associated subtle architectural distortion. There are no associated suspicious calcifications. No suspicious findings elsewhere in the LEFT breast. Targeted ultrasound is performed, showing an irregular hypoechoic mass with vague and angular margins at the 1:30 o'clock position approximately 8 cm from the nipple at MIDDLE depth, measuring approximately 2.5 x 1.7 x 2.4 cm, demonstrating mixed posterior characteristics and demonstrating internal power Doppler flow. Sonographic evaluation of the LEFT axilla demonstrates no pathologic lymphadenopathy. RIGHT: No findings suspicious for malignancy. On correlative physical exam, there is a vague palpable mass deep in the UPPER INNER LEFT breast. IMPRESSION: 1. Highly suspicious 2.5 cm mass involving the UPPER OUTER QUADRANT of the LEFT breast. 2. No pathologic LEFT axillary lymphadenopathy. 3. No mammographic evidence of malignancy involving the RIGHT breast. RECOMMENDATION: Ultrasound-guided core needle biopsy of the LEFT breast mass. The ultrasound core needle biopsy procedure was discussed with the patient and her questions were answered. She wishes to proceed and the  biopsy has been scheduled for Wednesday, November 24 at 12:45 p.m. I have discussed the findings and recommendations with the patient. BI-RADS CATEGORY  5: Highly suggestive of malignancy. Electronically Signed   By: Evangeline Dakin M.D.   On: 05/10/2020 15:48   MS DIGITAL DIAG TOMO BILAT  Result Date: 05/10/2020 CLINICAL DATA:  44 year old presenting with a palpable lump in the OUTER LEFT breast which she initially felt approximately 6 months ago. This is the patient's initial baseline mammogram. EXAM: DIGITAL DIAGNOSTIC BILATERAL MAMMOGRAM WITH CAD AND TOMO ULTRASOUND LEFT BREAST COMPARISON:  None. ACR Breast Density Category b: There are scattered areas of fibroglandular density. FINDINGS: Tomosynthesis and synthesized  full field CC and MLO views of both breasts were obtained. Tomosynthesis and synthesized spot compression tangential view of the area of concern in the LEFT breast and tomosynthesis and synthesized spot compression CC and MLO views of a finding in the LEFT breast were also obtained. Mammographic images were processed with CAD. LEFT: An isodense to slightly hyperdense mass with irregular margins is present in the OUTER LEFT breast at MIDDLE depth, corresponding to the palpable concern. The mass has a maximum measurement of approximately 3 cm. There is associated subtle architectural distortion. There are no associated suspicious calcifications. No suspicious findings elsewhere in the LEFT breast. Targeted ultrasound is performed, showing an irregular hypoechoic mass with vague and angular margins at the 1:30 o'clock position approximately 8 cm from the nipple at MIDDLE depth, measuring approximately 2.5 x 1.7 x 2.4 cm, demonstrating mixed posterior characteristics and demonstrating internal power Doppler flow. Sonographic evaluation of the LEFT axilla demonstrates no pathologic lymphadenopathy. RIGHT: No findings suspicious for malignancy. On correlative physical exam, there is a vague  palpable mass deep in the UPPER INNER LEFT breast. IMPRESSION: 1. Highly suspicious 2.5 cm mass involving the UPPER OUTER QUADRANT of the LEFT breast. 2. No pathologic LEFT axillary lymphadenopathy. 3. No mammographic evidence of malignancy involving the RIGHT breast. RECOMMENDATION: Ultrasound-guided core needle biopsy of the LEFT breast mass. The ultrasound core needle biopsy procedure was discussed with the patient and her questions were answered. She wishes to proceed and the biopsy has been scheduled for Wednesday, November 24 at 12:45 p.m. I have discussed the findings and recommendations with the patient. BI-RADS CATEGORY  5: Highly suggestive of malignancy. Electronically Signed   By: Evangeline Dakin M.D.   On: 05/10/2020 15:48   MM CLIP PLACEMENT LEFT  Result Date: 05/18/2020 CLINICAL DATA:  Confirmation of clip placement after ultrasound-guided core needle biopsy of a 2.5 cm mass involving the UPPER OUTER QUADRANT of the LEFT breast. EXAM: 2D AND TOMOSYNTHESIS DIAGNOSTIC LEFT MAMMOGRAM POST ULTRASOUND BIOPSY COMPARISON:  Previous exam(s). FINDINGS: Tomosynthesis and synthesized full field CC and mediolateral images were obtained following ultrasound guided biopsy of a mass involving the UPPER OUTER QUADRANT of the LEFT breast. The ribbon shaped tissue marking clip is appropriately positioned within the biopsied mass in the [place]. Expected post biopsy changes are present without evidence of hematoma. IMPRESSION: Appropriate positioning of the ribbon shaped tissue marking clip within the biopsied mass in the [place] of the LEFT breast. Final Assessment: Post Procedure Mammograms for Marker Placement Electronically Signed   By: Evangeline Dakin M.D.   On: 05/18/2020 13:30   [place] LT BREAST BX W LOC DEV 1ST LESION IMG BX SPEC US GUIDE  Addendum Date: 05/23/2020   ADDENDUM REPORT: 05/23/2020 14:54 ADDENDUM: Pathology revealed GRADE III INVASIVE DUCTAL CARCINOMA of the LEFT  breast, 1:30 o'clock, 8cmfn, upper outer quadrant. This was found to be concordant by Dr. Peggye Fothergill. Pathology results were discussed with the patient by telephone. The patient reported doing well after the biopsy with tenderness at the site. Post biopsy instructions and care were reviewed and questions were answered. The patient was encouraged to call [facility] for any additional concerns. The patient was referred to [facility] at [facility] on June 01, 2020. Pathology results reported by Stacie Acres RN on 05/23/2020. Electronically Signed   By: Evangeline Dakin M.D.   On: 05/23/2020 14:54   Result Date: 05/23/2020 CLINICAL  DATA:  44 year old with a palpable 2.5 cm mass in the UPPER OUTER QUADRANT of the LEFT breast at the 1:30 o'clock position 8 cm from the nipple. EXAM: ULTRASOUND GUIDED LEFT BREAST CORE NEEDLE BIOPSY COMPARISON:  Previous exam(s). PROCEDURE: I met with the patient and we discussed the procedure of ultrasound-guided biopsy, including benefits and alternatives. We discussed the high likelihood of a successful procedure. We discussed the risks of the procedure, including infection, bleeding, tissue injury, clip migration, and inadequate sampling. Informed written consent was given. The usual time-out protocol was performed immediately prior to the procedure. Lesion quadrant: UPPER OUTER QUADRANT. Using sterile technique with chlorhexidine as skin antisepsis 1% lidocaine and 1% lidocaine with epinephrine as local anesthetic, under direct ultrasound visualization, a 12 gauge Bard Marquee core needle device placed through an 11 gauge introducer needle was used to perform biopsy of the mass in the UPPER OUTER QUADRANT of the LEFT breast using a lateral approach. At the conclusion of the procedure a ribbon shaped tissue marker clip was deployed into the biopsy cavity. Follow up 2 view mammogram was  performed and dictated separately. IMPRESSION: Ultrasound guided biopsy of a 2.5 cm mass involving the UPPER OUTER QUADRANT of the LEFT breast. No apparent complications. Electronically Signed: By: Evangeline Dakin M.D. On: 05/18/2020 13:31      IMPRESSION: Stage T2A, Nx, Mx, Left Breast UOQ, Invasive Ductal Carcinoma, ER+ / PR- / Her2+, Grade 3  The patient will be a good candidate for breast conservation with radiotherapy to the left breast. We discussed the general course of radiation, potential side effects, and toxicities with radiation and the patient is interested in this approach.   PLAN:  1. Genetics 2. Port / echo / neoadjuvant chemotherapy 3. Left lumpectomy with sentinel lymph node biopsy 4. Adjuvant radiation therapy 5. Aromatase inhibitor   ------------------------------------------------  Blair Promise, PhD, MD  This document serves as a record of services personally performed by [name], MD. It was created on his behalf by Clerance Lav, a trained medical scribe. The creation of this record is based on the scribe's personal observations and the provider's statements to them. This document has been checked and approved by the attending provider.

## 2020-06-01 NOTE — Patient Instructions (Signed)

## 2020-06-01 NOTE — Progress Notes (Signed)
REFERRING PROVIDER: [name], MD [address] [place],   [address]  PRIMARY PROVIDER:  None listed   PRIMARY REASON FOR VISIT:  1. Malignant neoplasm of upper-outer quadrant of left breast in female, estrogen receptor positive ([place])   2. Family history of non-Hodgkin's lymphoma   3.  Family history of breast cancer    HISTORY OF PRESENT ILLNESS:   Sheryl Mcmillan, a 44 y.o. female, was seen for a [facility] cancer genetics consultation during the breast multidisciplinary clinic at the request of Dr. Jana Hakim due to a personal history of cancer.  Sheryl Mcmillan presents to clinic today to discuss the possibility of a hereditary predisposition to cancer, genetic testing, and to further clarify her future cancer risks, as well as potential cancer risks for family members.   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 The preliminary treatment plan includes neoadjuvant chemotherapy and surgery.    RISK FACTORS:  Menarche was at age 56.  First live birth at age 73.  OCP use for approximately 0 years.  Ovaries intact: yes.  Hysterectomy: no.  Menopausal status: premenopausal.  HRT use: 0 years. Colonoscopy: yes; most recent in 2018 per patient. Mammogram within the last year: yes. Up to date with pelvic exams: most recent PAP 2018 per patient.   Past Medical History:  Diagnosis Date  . Arthritis   . Breast cancer ([place])   . Family history of non-Hodgkin's lymphoma 06/01/2020  . Hemorrhoids   . Migraines   . Obesity   . PCOS (polycystic ovarian syndrome)     Past Surgical History:  Procedure Laterality Date  . COSMETIC SURGERY    . DILATION AND CURETTAGE OF UTERUS    . HEMORRHOID SURGERY      Social History   Socioeconomic History  . Marital status: Single    Spouse name: Not on file  . Number of children: 1  . Years of education: Not on file  . Highest education level: Some  college, no degree  Occupational History  . Not on file  Tobacco Use  . Smoking status: Current Every Day Smoker    Packs/day: 0.50    Years: 10.00    Pack years: 5.00    Types: Cigarettes  . Smokeless tobacco: Never Used  Vaping Use  . Vaping Use: Never used  Substance and Sexual Activity  . Alcohol use: Yes    Comment: occasionally  . Drug use: Yes    Types: Marijuana  . Sexual activity: Yes    Birth control/protection: None  Other Topics Concern  . Not on file  Social History Narrative  . Not on file   Social Determinants of Health   Financial Resource Strain:   . Difficulty of Paying Living Expenses: Not on file  Food Insecurity:   . Worried About Charity fundraiser in the Last Year: Not on file  . Ran Out of Food in the Last Year: Not on file  Transportation Needs: No Transportation Needs  . Lack of Transportation (Medical): No  . Lack of Transportation (Non-Medical): No  Physical Activity:   . Days of Exercise per Week: Not on file  . Minutes of Exercise per Session: Not on file  Stress:   . Feeling of Stress : Not on file  Social Connections:   . Frequency of Communication with Friends and Family: Not on file  . Frequency of Social Gatherings with Friends  and Family: Not on file  . Attends Religious Services: Not on file  . Active Member of Clubs or Organizations: Not on file  . Attends Archivist Meetings: Not on file  . Marital Status: Not on file     FAMILY HISTORY:  We obtained a detailed, 4-generation family history.  Significant diagnoses are listed below: Family History  Problem Relation Age of Onset  . Cancer Mother 29       unknown primary  . Other Mother        brain tumor; dx 67s  . Non-Hodgkin's lymphoma Brother 9  . Other Maternal Uncle 52       brain tumor  . Breast cancer Other        MGM's niece; dx late 3s    Sheryl Mcmillan has one daughter, age 50, who does not have a history of cancer. Sheryl Mcmillan has two maternal half  brothers.  One maternal half brother was diagnosed with Non Hodgkins Lymphoma and passed away at age 102. Sheryl Mcmillan mother passed away at age 48 after being diagnosed with cancer of an unknown primary.  Sheryl Mcmillan mother also had a brain tumor diagnosed in her 17s.  Sheryl Mcmillan maternal half uncle has a brain tumor diagnosed at age 67.  Sheryl Mcmillan reported that her maternal grandmother may have had a cancer history, possibly of gynecologic origin, but had limited information about this possible diagnosis.  Sheryl Mcmillan also reported that her maternal grandmother's niece had breast cancer diagnosed in her late 61s.   Sheryl Mcmillan does not have information about her father or paternal family history.   Sheryl Mcmillan is unaware of previous family history of genetic testing for hereditary cancer risks. Patient's maternal ancestors are of [place], [place], and [place] descent. There is no reported Ashkenazi Jewish ancestry. There is no known consanguinity.  GENETIC COUNSELING ASSESSMENT: Sheryl Mcmillan is a 44 y.o. female with a personal history of cancer which is somewhat suggestive of a hereditary cancer syndrome, such as Hereditary Breast and Ovarian Cancer Syndrome, and predisposition to cancer given her age of diagnosis. We, therefore, discussed and recommended the following at today's visit.   DISCUSSION: We discussed that 5 - 10% of cancer is hereditary, with most cases of hereditary breast cancer associated with mutations in BRCA1/2.  There are other genes that can be associated with hereditary breast cancer syndromes.  Type of cancer risk and level of risk are gene-specific.  We discussed that testing is beneficial for several reasons including knowing how to follow individuals after completing their treatment, identifying whether potential treatment options would be beneficial, and understanding if other family members could be at risk for cancer and allowing them to undergo genetic testing.   We  reviewed the characteristics, features and inheritance patterns of hereditary cancer syndromes. We also discussed genetic testing, including the appropriate family members to test, the process of testing, cost of testing, and turn-around-time for results. We discussed the implications of a negative, positive, carrier and/or variant of uncertain significant result. We recommended Sheryl Mcmillan pursue genetic testing for a panel that includes genes associated with breast cancer.   Sheryl Mcmillan  was offered a common hereditary cancer panel (48 genes) and an expanded pan-cancer panel (85 genes). Sheryl Mcmillan was informed of the benefits and limitations of each panel, including that expanded pan-cancer panels contain several preliminary evidence genes that do not have clear management guidelines at this point in time.  We also discussed that as  the number of genes included on a panel increases, the chances of variants of uncertain significance increases.  After considering the benefits and limitations of each gene panel, Sheryl Mcmillan  elected to have an expanded pan-cancer panel through [facility].  The Multi-Gene Panel offered by [facility] includes sequencing and/or deletion duplication testing of the following 85 genes: AIP, ALK, APC, ATM, AXIN2,BAP1,  BARD1, BLM, BMPR1A, BRCA1, BRCA2, BRIP1, CASR, CDC73, CDH1, CDK4, CDKN1B, CDKN1C, CDKN2A (p14ARF), CDKN2A (p16INK4a), CEBPA, CHEK2, CTNNA1, DICER1, DIS3L2, EGFR (c.2369C>T, p.Thr790Met variant only), EPCAM (Deletion/duplication testing only), FH, FLCN, GATA2, GPC3, GREM1 (Promoter region deletion/duplication testing only), HOXB13 (c.251G>A, p.Gly84Glu), HRAS, KIT, MAX, MEN1, MET, MITF (c.952G>A, p.Glu318Lys variant only), MLH1, MSH2, MSH3, MSH6, MUTYH, NBN, NF1, NF2, NTHL1, PALB2, PDGFRA, PHOX2B, PMS2, POLD1, POLE, POT1, PRKAR1A, PTCH1, PTEN, RAD50, RAD51C, RAD51D, RB1, RECQL4, RET, RNF43, RUNX1, SDHAF2, SDHA (sequence changes only), SDHB, SDHC, SDHD, SMAD4, SMARCA4, SMARCB1,  SMARCE1, STK11, SUFU, TERC, TERT, TMEM127, TP53, TSC1, TSC2, VHL, WRN and WT1.   Based on Ms. Asleson's personal history of breast cancer, she meets medical criteria for genetic testing. Despite that she meets criteria, she may still have an out of pocket cost.  We discussed the Patient Assistant Program (PAP) through [facility].  Ms. Merriman completed an application to determine if she qualifies for a test fee waiver.  She was made aware that [facility] may request proof of income to verify eligibility.   PLAN: After considering the risks, benefits, and limitations, Ms. Fifer provided informed consent to pursue genetic testing and the blood sample was sent to [facility] for analysis of the Multi-Cancer Panel. Results should be available within approximately 3 weeks' time, at which point they will be disclosed by telephone to Ms. Baltz, as will any additional recommendations warranted by these results. Ms. Artiaga will receive a summary of her genetic counseling visit and a copy of her results once available. This information will also be available in Epic.   Lastly, we encouraged Ms. Snelson to remain in contact with cancer genetics annually so that we can continuously update the family history and inform her of any changes in cancer genetics and testing that may be of benefit for this family.   Ms. Lagasse questions were answered to her satisfaction today. Our contact information was provided should additional questions or concerns arise. Thank you for the referral and allowing [place] to share in the care of your patient.   Bitania Shankland M. Joette Catching, [place], [facility] Certified Film/video [email or web address] (P) [phone]  The patient was seen for a total of 20 minutes in face-to-face genetic counseling.  Drs. Magrinat, Lindi Adie and/or Burr Medico were available to discuss this patient.   _______________________________________________________________________ For Office Staff:  Number of people  involved in session: 1 Was an Intern/ student involved with case: no

## 2020-06-01 NOTE — Therapy (Signed)
[place], [place], [address] Phone: [phone]   Fax:  [phone]  Physical Therapy Evaluation  Patient Details  Name: Sheryl Mcmillan MRN: [id number] Date of Birth: 1975/06/27 Referring Provider (PT): Dr. [name]   Encounter Date: 06/01/2020   PT End of Session - 06/01/20 1914    Visit Number 1    Number of Visits 2    Date for PT Re-Evaluation 11/30/20    PT Start Time 8875    PT Stop Time 1351    PT Time Calculation (min) 28 min    Activity Tolerance Patient tolerated treatment well    Behavior During Therapy [facility] for tasks assessed/performed           Past Medical History:  Diagnosis Date  . Arthritis   . Breast cancer ([place])   . Family history of non-Hodgkin's lymphoma 06/01/2020  . Hemorrhoids   . Migraines   . Obesity   . PCOS (polycystic ovarian syndrome)     Past Surgical History:  Procedure Laterality Date  . COSMETIC SURGERY    . DILATION AND CURETTAGE OF UTERUS    . HEMORRHOID SURGERY      There were no vitals filed for this visit.    Subjective Assessment - 06/01/20 1851    Subjective Patient reports she is here today to be seen by her medical team for her newly diagnosed left breast cancer.    Patient is accompained by: Family member    Pertinent History Patient was diagnosed on 05/10/2020 with left grade III invasive ductal carcinoma breast cancer. It measures 2.5 cm and is located in the upper outer quadrant. It is ER positive, PR negative, and HER2 positive with a Ki67 of 50%. She underwent plastic surgery to her left arm at age 46 when her arm was severely cut in a glass door.    Patient Stated Goals Reduce lymphedema risk and learn post op shoulder ROM HEP    Currently in Pain? Yes    Pain Score 2     Pain Location Abdomen    Pain Orientation Left    Pain Descriptors / Indicators Aching    Pain Type Chronic pain    Pain Onset More than a month ago     Pain Frequency Intermittent    Aggravating Factors  Nothing    Pain Relieving Factors Nothing              [facility] PT Assessment - 06/01/20 0001      Assessment   Medical Diagnosis Left breast cancer    Referring Provider (PT) Dr. [name]    Onset Date/Surgical Date 05/10/20    Hand Dominance Right    Prior Therapy none      Precautions   Precautions Other (comment)    Precaution Comments active cancer      Restrictions   Weight Bearing Restrictions No      Balance Screen   Has the patient fallen in the past 6 months No    Has the patient had a decrease in activity level because of a fear of falling?  No    Is the patient reluctant to leave their home because of a fear of falling?  No      Home Environment   Living Environment Private residence    Living Arrangements Other relatives   Boyfriend, 23 mth old granddaughter, 59 y.o. daughter   Available Help at Discharge Family  Prior Function   Level of Independence Independent    Vocation Full time employment    Vocation Requirements CNA in homes    Leisure She does not exercise      Cognition   Overall Cognitive Status Within Functional Limits for tasks assessed      Posture/Postural Control   Posture/Postural Control Postural limitations    Postural Limitations Rounded Shoulders;Forward head      ROM / Strength   AROM / PROM / Strength AROM;Strength      AROM   Overall AROM Comments Cervical AROM is WNL    AROM Assessment Site Shoulder    Right/Left Shoulder Right;Left    Right Shoulder Extension 45 Degrees    Right Shoulder Flexion 155 Degrees    Right Shoulder ABduction 518 Degrees    Right Shoulder Internal Rotation 62 Degrees    Right Shoulder External Rotation 90 Degrees    Left Shoulder Extension 48 Degrees    Left Shoulder Flexion 146 Degrees    Left Shoulder ABduction 148 Degrees    Left Shoulder Internal Rotation 65 Degrees    Left Shoulder External Rotation 90 Degrees       Strength   Overall Strength Within functional limits for tasks performed             LYMPHEDEMA/ONCOLOGY QUESTIONNAIRE - 06/01/20 0001      Type   Cancer Type Left breast cancer      Lymphedema Assessments   Lymphedema Assessments Upper extremities      Right Upper Extremity Lymphedema   10 cm Proximal to Olecranon Process 40.1 cm    Olecranon Process 29.8 cm    10 cm Proximal to Ulnar Styloid Process 27.2 cm    Just Proximal to Ulnar Styloid Process 18.6 cm    Across Hand at [employer] 21.5 cm    At [place] of 2nd Digit 6.7 cm      Left Upper Extremity Lymphedema   10 cm Proximal to Olecranon Process 40.4 cm    Olecranon Process 29.6 cm    10 cm Proximal to Ulnar Styloid Process 26.1 cm    Just Proximal to Ulnar Styloid Process 18.8 cm    Across Hand at [employer] 20.8 cm    At [place] of 2nd Digit 6.4 cm           L-DEX FLOWSHEETS - 06/01/20 1900      L-DEX LYMPHEDEMA SCREENING   Measurement Type Unilateral    L-DEX MEASUREMENT EXTREMITY Upper Extremity    POSITION  Standing    DOMINANT SIDE Right    At Risk Side Left    BASELINE SCORE (UNILATERAL) 0.9           The patient was assessed using the L-Dex machine today to produce a lymphedema index baseline score. The patient will be reassessed on a regular basis (typically every 3 months) to obtain new L-Dex scores. If the score is > 6.5 points away from his/her baseline score indicating onset of subclinical lymphedema, it will be recommended to wear a compression garment for 4 weeks, 12 hours per day and then be reassessed. If the score continues to be > 6.5 points from baseline at reassessment, we will initiate lymphedema treatment. Assessing in this manner has a 95% rate of preventing clinically significant lymphedema.      Katina Dung - 06/01/20 0001    Open a tight or new jar Moderate difficulty    Do heavy household chores (wash  walls, wash floors) Moderate difficulty    Carry a shopping bag or  briefcase Mild difficulty    Wash your back Moderate difficulty    Use a knife to cut food No difficulty    Recreational activities in which you take some force or impact through your arm, shoulder, or hand (golf, hammering, tennis) Severe difficulty    During the past week, to what extent has your arm, shoulder or hand problem interfered with your normal social activities with family, friends, neighbors, or groups? Slightly    During the past week, to what extent has your arm, shoulder or hand problem limited your work or other regular daily activities Modererately    Arm, shoulder, or hand pain. Moderate    Tingling (pins and needles) in your arm, shoulder, or hand Mild    Difficulty Sleeping Mild difficulty    DASH Score 38.64 %            Objective measurements completed on examination: See above findings.      Patient was instructed today in a home exercise program today for post op shoulder range of motion. These included active assist shoulder flexion in sitting, scapular retraction, wall walking with shoulder abduction, and hands behind head external rotation.  She was encouraged to do these twice a day, holding 3 seconds and repeating 5 times when permitted by her physician.             PT Education - 06/01/20 1913    Education Details Lymphedema risk reduction and post op shoulder ROM HEP    Person(s) Educated Patient;Other (comment)   daughter   Methods Explanation;Demonstration;Handout    Comprehension Returned demonstration;Verbalized understanding               PT Long Term Goals - 06/01/20 1918      PT LONG TERM GOAL #1   Title Patient will demonstrate she has regained full shoulder ROM and function post opeeratively compared to baselines.    Time 6    Period Months    Status New    Target Date 11/30/20           Breast Clinic Goals - 06/01/20 1918      Patient will be able to verbalize understanding of pertinent lymphedema risk reduction  practices relevant to her diagnosis specifically related to skin care.   Time 1    Period Days    Status Achieved      Patient will be able to return demonstrate and/or verbalize understanding of the post-op home exercise program related to regaining shoulder range of motion.   Time 1    Period Days    Status Achieved      Patient will be able to verbalize understanding of the importance of attending the postoperative After Breast Cancer Class for further lymphedema risk reduction education and therapeutic exercise.   Time 1    Period Days    Status Achieved                 Plan - 06/01/20 1915    Clinical Impression Statement Patient was diagnosed on 05/10/2020 with left grade III invasive ductal carcinoma breast cancer. It measures 2.5 cm and is located in the upper outer quadrant. It is ER positive, PR negative, and HER2 positive with a Ki67 of 50%. She underwent plastic surgery to her left arm at age 27 when her arm was severely cut in a glass door. Her multidisciplinary medical team met prior to  her assessments to determine a recommended treatment plan. She is planning to have neoadjuvant chemotherapy followed by a left lumpectomy and sentinel node biopsy, radiation, and anti-estrogen therapy. She will benefit from a post op PT reassessment to determine needs and from L-Dex screens every 3 months for 2 years to detect subclinical lymphedema.    Stability/Clinical Decision Making Stable/Uncomplicated    Clinical Decision Making Low    Rehab Potential Excellent    PT Frequency --   Eval and 1 f/u visit   PT Treatment/Interventions ADLs/Self Care Home Management;Therapeutic exercise;Patient/family education    PT Next Visit Plan Will reassess 3-4 weeks post op    PT Home Exercise Plan Post op shoulder ROM HEP    Consulted and Agree with Plan of Care Patient;Family member/caregiver    Family Member Consulted Daughter           Patient will benefit from skilled therapeutic  intervention in order to improve the following deficits and impairments:  Postural dysfunction, Decreased range of motion, Decreased knowledge of precautions, Impaired UE functional use, Pain  Visit Diagnosis: Malignant neoplasm of upper-outer quadrant of left breast in female, estrogen receptor positive ([place]) - Plan: PT plan of care cert/re-cert  Abnormal posture - Plan: PT plan of care cert/re-cert   Patient will follow up at outpatient cancer rehab 3-4 weeks following surgery.  If the patient requires physical therapy at that time, a specific plan will be dictated and sent to the referring physician for approval. The patient was educated today on appropriate basic range of motion exercises to begin post operatively and the importance of attending the After Breast Cancer class following surgery.  Patient was educated today on lymphedema risk reduction practices as it pertains to recommendations that will benefit the patient immediately following surgery.  She verbalized good understanding.     Problem List Patient Active Problem List   Diagnosis Date Noted  . Family history of non-Hodgkin's lymphoma 06/01/2020  . Malignant neoplasm of upper-outer quadrant of left breast in female, estrogen receptor positive ([place]) 05/26/2020   Annia Friendly, PT 06/01/20 7:23 PM  [place], [place], [address] Phone: [phone]   Fax:  [phone]  Name: Sheryl Mcmillan MRN: [id number] Date of Birth: [birth year]

## 2020-06-01 NOTE — Progress Notes (Signed)
START ON PATHWAY REGIMEN - Breast     A cycle is every 21 days:     Pertuzumab      Pertuzumab      Trastuzumab-xxxx      Trastuzumab-xxxx      Carboplatin      Docetaxel   **Always confirm dose/schedule in your pharmacy ordering system**  Patient Characteristics: Preoperative or Nonsurgical Candidate (Clinical Staging), Neoadjuvant Therapy followed by Surgery, Invasive Disease, Chemotherapy, HER2 Positive, ER Positive Therapeutic Status: Preoperative or Nonsurgical Candidate (Clinical Staging) AJCC M Category: cM0 AJCC Grade: G3 Breast Surgical Plan: Neoadjuvant Therapy followed by Surgery ER Status: Positive (+) AJCC 8 Stage Grouping: IIA HER2 Status: Positive (+) AJCC T Category: cT2 AJCC N Category: cN0 PR Status: Negative (-) Intent of Therapy: Curative Intent, Discussed with Patient 

## 2020-06-02 ENCOUNTER — Telehealth: Payer: Self-pay | Admitting: *Deleted

## 2020-06-02 ENCOUNTER — Encounter: Payer: Self-pay | Admitting: *Deleted

## 2020-06-02 ENCOUNTER — Telehealth: Payer: Self-pay | Admitting: Oncology

## 2020-06-02 ENCOUNTER — Encounter: Payer: Self-pay | Admitting: Genetic Counselor

## 2020-06-02 NOTE — Telephone Encounter (Signed)
Scheduled appts per 12/8 los. Pt confirmed appt dates and times.

## 2020-06-02 NOTE — Telephone Encounter (Signed)
Spoke to pt concerning [place] from 12.8.21. Denies questions or concerns regarding dx or treatment care plan. Discussed MRI will not be done and tx response will be assessed by diag mammo/US. Confirmed port placement at [facility] at 12pm at [facility]. Encourage pt to call with needs. Received verbal understanding.

## 2020-06-03 ENCOUNTER — Encounter: Payer: Self-pay | Admitting: Licensed Clinical Social Worker

## 2020-06-03 ENCOUNTER — Telehealth: Payer: Self-pay

## 2020-06-03 NOTE — Telephone Encounter (Signed)
Medicaid application faxed to DSS.

## 2020-06-03 NOTE — Progress Notes (Signed)
[place] Work  Clinical Social Work was referred by L. Lundeen for assessment of psychosocial needs.  Clinical Social Worker contacted patient by phone  to offer support and assess for needs.    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 Recently, two families let her go due to diagnosis, so she is reduced to 10 hrs/ week. Extremely financially stressed due to this and concerns for how to pay for treatment as well as other bills. She does live with her significant other, his daughter, and her daughter so there is other household income.  Application for [facility] Medicaid is pending which will help with medical expenses. CSW reviewed options for other assistance through cancer foundations, [employer], and emergency assistance available in [place].   Plan:  CSW will sign up patient for [place] mailing information on cancer foundation assistance and emergency assistance programs in [place] for patient to complete  Request sent to financial advocate for [employer] grant   Christeen Douglas, [place]

## 2020-06-06 ENCOUNTER — Telehealth: Payer: Self-pay | Admitting: *Deleted

## 2020-06-06 ENCOUNTER — Encounter: Payer: Self-pay | Admitting: *Deleted

## 2020-06-06 NOTE — Telephone Encounter (Signed)
Called pt regarding her 1st TCHP. Discussed chcc full for long tx. Asked if pt would be willing to have in [place]. Pt agree. Melanie AD and Melissa notified pt agree.

## 2020-06-09 ENCOUNTER — Telehealth: Payer: Self-pay

## 2020-06-09 ENCOUNTER — Other Ambulatory Visit: Payer: Self-pay | Admitting: Oncology

## 2020-06-09 ENCOUNTER — Inpatient Hospital Stay: Payer: Medicaid Other

## 2020-06-09 ENCOUNTER — Encounter: Payer: Self-pay | Admitting: Oncology

## 2020-06-09 ENCOUNTER — Ambulatory Visit (HOSPITAL_COMMUNITY)
Admission: RE | Admit: 2020-06-09 | Discharge: 2020-06-09 | Disposition: A | Discharge status: Home / Self Care | Payer: Medicaid Other | Source: Ambulatory Visit | Attending: Oncology | Admitting: Oncology

## 2020-06-09 ENCOUNTER — Other Ambulatory Visit: Payer: Self-pay

## 2020-06-09 ENCOUNTER — Encounter: Payer: Self-pay | Admitting: Licensed Clinical Social Worker

## 2020-06-09 DIAGNOSIS — C50412 Malignant neoplasm of upper-outer quadrant of left female breast: Secondary | ICD-10-CM | POA: Insufficient documentation

## 2020-06-09 DIAGNOSIS — Z170 Estrogen receptor positive status [ER+]: Secondary | ICD-10-CM

## 2020-06-09 DIAGNOSIS — Z0189 Encounter for other specified special examinations: Secondary | ICD-10-CM | POA: Diagnosis not present

## 2020-06-09 LAB — ECHOCARDIOGRAM COMPLETE
Area-P 1/2: 3.42 cm2
Calc EF: 71.6 %
S' Lateral: 3 cm
Single Plane A2C EF: 70 %
Single Plane A4C EF: 72.1 %

## 2020-06-09 MED ORDER — LIDOCAINE-PRILOCAINE 2.5-2.5 % EX CREA: TOPICAL_CREAM | CUTANEOUS | 3 refills | Status: DC

## 2020-06-09 MED ORDER — PROCHLORPERAZINE MALEATE 10 MG PO TABS: 10.0000 mg | ORAL_TABLET | Freq: Four times a day (QID) | ORAL | 1 refills | Status: DC | PRN

## 2020-06-09 MED ORDER — DEXAMETHASONE 4 MG PO TABS: 8.0000 mg | ORAL_TABLET | Freq: Two times a day (BID) | ORAL | 1 refills | Status: DC

## 2020-06-09 MED FILL — DEXAMETHASONE 4 MG TABLET: 4 | 12 days supply | Qty: 30 | Fill #0

## 2020-06-09 MED FILL — PROCHLORPERAZINE 10 MG TAB: 10 | 7 days supply | Qty: 30 | Fill #0

## 2020-06-09 NOTE — Telephone Encounter (Signed)
Pt's prescription's were changed to [facility], Because Pt received a grant to pay for medications.

## 2020-06-09 NOTE — Progress Notes (Signed)
  Echocardiogram 2D Echocardiogram has been performed.  Bobbye [place] 06/09/2020, 10:53 AM

## 2020-06-09 NOTE — Progress Notes (Signed)
Met with patient by elevators to introduce myself as Arboriculturist and to offer assistance for medication need.  Discussed [employer] grant to assist with medication need. Patient will have paystub emailed to her and then will email to me. Approved patient based on urgency need for medication. Will discuss other available expenses after income documentation received. She has a copy of the approval letter to take to pharmacy along with pharmacy information.  She has my card for any additional financial questions or concerns.

## 2020-06-09 NOTE — Progress Notes (Signed)
The following biosimilar Udenyca ([id number]) has been selected for use in this patient. Attempting pt assistance.  Kennith Center, Pharm.D., CPP 06/09/2020@10 :37 AM

## 2020-06-09 NOTE — Progress Notes (Signed)
[facility] [place]  Telephone:(336) [phone] Fax:(336) [phone]     ID: Sheryl Mcmillan DOB: 04-28-1976  MR#: [id number]  CZY#:[id number]  Patient Care Team: Patient, No Pcp Per as PCP - General (General Practice) Sheryl Kaufmann, RN as Oncology Nurse Navigator Sheryl Germany, RN as Oncology Nurse Navigator Sheryl Bookbinder, MD as Consulting Physician (General Surgery) Sheryl Mcmillan, Sheryl Dad, MD as Consulting Physician (Oncology) Sheryl Pray, MD as Consulting Physician (Radiation Oncology) Sheryl Cruel, MD OTHER MD:  CHIEF COMPLAINT: estrogen receptor negative, Her2 positive breast cancer  CURRENT TREATMENT: Neoadjuvant chemotherapy   INTERVAL HISTORY: Sheryl Mcmillan returns today for follow up of her estrogen receptor negative, Her2 positive breast cancer accompanied by her daughter. She was evaluated in the multidisciplinary breast cancer clinic on 06/01/2020.  She underwent genetic testing during clinic. The results are still pending.  She underwent echocardiogram yesterday, 06/09/2020, showing an ejection fraction of 65-70%.  She is scheduled for port placement on 06/15/2020.  She is scheduled to begin neoadjuvant chemotherapy 06/16/2020 to consist of carboplatin and docetaxel together with trastuzumab and Pertuzumab every 3 weeks x 6.   REVIEW OF SYSTEMS: Sheryl Mcmillan has cut her hair short and dyed it pink. She is otherwise getting mentally ready for the upcoming chemotherapy. She met with the chemo teaching nurse and "it was great." She is working on her HCPOA document. A detailed ROS today was otherwise stable   COVID 19 VACCINATION STATUS:    HISTORY OF CURRENT ILLNESS: From the original intake note:   Sheryl Mcmillan presented with a palpable lump in the outer left breast, which she initially felt approximately 6 months prior. She waited to seek medical attention because she had no insurance.  Eventually she enrolled in the BCE[facility] program and underwent bilateral  diagnostic mammography with tomography and left breast ultrasonography at [facility] on 05/10/2020 showing: breast density category B; vaguely palpable 2.5 cm mass in left breast at 1:30; no pathologic left axillary lymphadenopathy or evidence of right breast malignancy.  Accordingly on 05/18/2020 she proceeded to biopsy of the left breast area in question. The pathology from this procedure ([id number]) showed: invasive ductal carcinoma, grade 3. Prognostic indicators significant for: estrogen receptor, 40% positive with weak staining intensity and progesterone receptor, 0% negative. Proliferation marker Ki67 at 50%. HER2 positive by immunohistochemistry (3+).  The patient's subsequent history is as detailed below.   PAST MEDICAL HISTORY: Past Medical History:  Diagnosis Date  . Arthritis   . Breast cancer ([place])   . Family history of non-Hodgkin's lymphoma 06/01/2020  . Hemorrhoids   . Migraines   . Obesity   . PCOS (polycystic ovarian syndrome)     PAST SURGICAL HISTORY: Past Surgical History:  Procedure Laterality Date  . COSMETIC SURGERY    . DILATION AND CURETTAGE OF UTERUS    . HEMORRHOID SURGERY      FAMILY HISTORY: Family History  Problem Relation Age of Onset  . Hypertension Mother   . Diabetes Mother   . Cancer Mother 57       unknown primary  . Other Mother        brain tumor; dx 54s  . Non-Hodgkin's lymphoma Brother 72  . Other Maternal Uncle 52       brain tumor  . Breast cancer Other        MGM's niece; dx late 14s   She has no information on her father. Her mother died at age 81 from stage IV cancer. The primary cancer  was unknown, but it was not breast.  Sheryl Mcmillan has two half brothers (through her mother), one of which was diagnosed with non-Hodgkin's lymphoma. There is no family history of breast, ovarian, or prostate cancer to her knowledge.    GYNECOLOGIC HISTORY:  No LMP recorded. Menarche: 44 years old Age at first live birth: 44 years old [place] P  1 LMP 05/02/2020 Contraceptive: never used HRT n/a  Hysterectomy? no BSO? 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 She is single. Her significant other Sheryl Mcmillan is a Freight forwarder. She lives at home with Sheryl Mcmillan, her daughter Sheryl Mcmillan,  Sheryl Mcmillan's daughter Sheryl Mcmillan and her 83-monthold daughter Sheryl Mcmillan Daughter Sheryl Mcmillan age 43 is a bChief Operating Officerat [name]    ADVANCED DIRECTIVES: She intends to name her daughter Sheryl Birksas her HCPOA.  At the 06/01/2020 visit the patient was given the appropriate documents to complete and notarized at her discretion..[name]  HEALTH MAINTENANCE: Social History   Tobacco Use  . Smoking status: Current Every Day Smoker    Packs/day: 0.50    Years: 10.00    Pack years: 5.00    Types: Cigarettes  . Smokeless tobacco: Never Used  Vaping Use  . Vaping Use: Never used  Substance Use Topics  . Alcohol use: Yes    Comment: occasionally  . Drug use: Yes    Types: Marijuana     Colonoscopy: 2018  PAP: 2018  Bone density: n/a (age)   Allergies  Allergen Reactions  . Crest Tc-Baking Soda [Sodium Fluoride] Hives    Baking Soda    Current Outpatient Medications  Medication Sig Dispense Refill  . acetaminophen (TYLENOL) 325 MG tablet Take 650 mg by mouth every 6 (six) hours as needed.    .Marland KitchenDAILY MULTIPLE VITAMINS PO Take by mouth.    . dexamethasone (DECADRON) 4 MG tablet Take 2 tablets (8 mg total) by mouth 2 (two) times daily. Start the day before Taxotere. Then take daily x 3 days after chemotherapy. 30 tablet 1  . Homeopathic Products (LIVER SUPPORT SL) Place under the tongue.    . lidocaine-prilocaine (EMLA) cream Apply to affected area once 30 g 3  . Phenylephrine-Aspirin (ALKA-SELTZER PLUS SINUS PO) Take by mouth.    . prochlorperazine (COMPAZINE) 10 MG tablet Take 1 tablet (10 mg total) by mouth every 6 (six) hours as  needed (Nausea or vomiting). 30 tablet 1   No current facility-administered medications for this visit.    OBJECTIVE: White woman who appears stated age  V19   06/10/20 1148  BP: (!) 140/93  Pulse: 87  Resp: 17  Temp: 98.2 F (36.8 C)  SpO2: 100%     Body mass index is 42.66 kg/m.   Wt Readings from Last 3 Encounters:  06/10/20 (!) 305 lb 14.4 oz (138.8 kg)  06/01/20 (!) 308 lb (139.7 kg)  05/05/20 (!) 307 lb 11.2 oz (139.6 kg)      ECOG FS:1 - Symptomatic but completely ambulatory  Sclerae unicteric, EOMs intact Wearing a mask No cervical or supraclavicular adenopathy Lungs no rales or rhonchi Heart regular rate and rhythm Abd soft, nontender, positive bowel sounds MSK no focal spinal tenderness, no upper extremity lymphedema Neuro: nonfocal, well oriented, appropriate affect Breast exam: deferred  LAB RESULTS:  CMP     Component Value Date/Time   NA 139 06/01/2020 1232   K 4.0 06/01/2020 1232  CL 107 06/01/2020 1232   CO2 22 06/01/2020 1232   GLUCOSE 150 (H) 06/01/2020 1232   BUN 12 06/01/2020 1232   CREATININE 0.91 06/01/2020 1232   CALCIUM 9.5 06/01/2020 1232   PROT 7.1 06/01/2020 1232   ALBUMIN 3.6 06/01/2020 1232   AST 15 06/01/2020 1232   ALT 19 06/01/2020 1232   ALKPHOS 83 06/01/2020 1232   BILITOT 0.3 06/01/2020 1232   GFRNONAA >60 06/01/2020 1232   GFRAA >60 06/06/2019 1205    No results found for: TOTALPROTELP, ALBUMINELP, A1GS, A2GS, BETS, BETA2SER, GAMS, MSPIKE, SPEI  Lab Results  Component Value Date   WBC 10.9 (H) 06/01/2020   NEUTROABS 6.5 06/01/2020   HGB 13.5 06/01/2020   HCT 41.4 06/01/2020   MCV 85.4 06/01/2020   PLT 377 06/01/2020    No results found for: LABCA2  No components found for: PYPPJK932  No results for input(s): INR in the last 168 hours.  No results found for: LABCA2  No results found for: IZT245  No results found for: YKD983  No results found for: JAS505  No results found for: CA2729  No  components found for: HGQUANT  No results found for: CEA1 / No results found for: CEA1   No results found for: AFPTUMOR  No results found for: CHROMOGRNA  No results found for: KPAFRELGTCHN, LAMBDASER, KAPLAMBRATIO (kappa/lambda light chains)  No results found for: HGBA, HGBA2QUANT, HGBFQUANT, HGBSQUAN (Hemoglobinopathy evaluation)   No results found for: LDH  No results found for: IRON, TIBC, IRONPCTSAT (Iron and TIBC)  No results found for: FERRITIN  Urinalysis    Component Value Date/Time   COLORURINE YELLOW 06/06/2019 [place] 06/06/2019 1205   LABSPEC >1.030 (H) 06/06/2019 1205   PHURINE 5.5 06/06/2019 1205   GLUCOSEU NEGATIVE 06/06/2019 1205   HGBUR TRACE (A) 06/06/2019 1205   BILIRUBINUR NEGATIVE 06/06/2019 1205   KETONESUR NEGATIVE 06/06/2019 1205   PROTEINUR NEGATIVE 06/06/2019 1205   UROBILINOGEN 1.0 05/09/2011 1805   NITRITE NEGATIVE 06/06/2019 1205   LEUKOCYTESUR NEGATIVE 06/06/2019 1205    STUDIES: ECHOCARDIOGRAM COMPLETE  Result Date: 06/09/2020    ECHOCARDIOGRAM REPORT   Patient Name:   HARLY PIPKINS Date of Exam: 06/09/2020 Medical Rec #:  [id number]       Height:       71.0 in Accession #:    [id number]      Weight:       308.0 lb Date of Birth:  1976-05-07        BSA:          2.533 m Patient Age:    44 years        BP:           118/79 mmHg Patient Gender: F               HR:           83 bpm. Exam Location:  Outpatient Procedure: 2D Echo, 3D Echo, Cardiac Doppler, Color Doppler and Strain Analysis Indications:    Z51.11 Encounter for antineoplastic chemotheraphy  History:        Patient has no prior history of Echocardiogram examinations. No                 prior cardiac history. Breast cacner.  Sonographer:    Roseanna Rainbow Referring Phys: [phone] Sheryl Mcmillan C Adren Dollins  Sonographer Comments: Technically difficult study due to poor echo windows, patient is morbidly obese, suboptimal parasternal window and no subcostal window.  Image acquisition  challenging due to patient body habitus. Global longitudinal strain was attempted. IMPRESSIONS  1. Left ventricular ejection fraction, by estimation, is 65 to 70%. The left ventricle has normal function. The left ventricle has no regional wall motion abnormalities. Left ventricular diastolic parameters are consistent with Grade I diastolic dysfunction (impaired relaxation).  2. Right ventricular systolic function is normal. The right ventricular size is normal.  3. The mitral valve is normal in structure. Trivial mitral valve regurgitation.  4. The aortic valve is normal in structure. Aortic valve regurgitation is not visualized. FINDINGS  Left Ventricle: Left ventricular ejection fraction, by estimation, is 65 to 70%. The left ventricle has normal function. The left ventricle has no regional wall motion abnormalities. The left ventricular internal cavity size was normal in size. There is  no left ventricular hypertrophy. Left ventricular diastolic parameters are consistent with Grade I diastolic dysfunction (impaired relaxation). Right Ventricle: The right ventricular size is normal. No increase in right ventricular wall thickness. Right ventricular systolic function is normal. Left Atrium: Left atrial size was normal in size. Right Atrium: Right atrial size was normal in size. Pericardium: There is no evidence of pericardial effusion. Mitral Valve: The mitral valve is normal in structure. Trivial mitral valve regurgitation. Tricuspid Valve: The tricuspid valve is grossly normal. Tricuspid valve regurgitation is not demonstrated. Aortic Valve: The aortic valve is normal in structure. Aortic valve regurgitation is not visualized. Pulmonic Valve: The pulmonic valve was normal in structure. Pulmonic valve regurgitation is not visualized. Aorta: The aortic root and ascending aorta are structurally normal, with no evidence of dilitation. IAS/Shunts: The atrial septum is grossly normal.  LEFT VENTRICLE PLAX 2D LVIDd:          5.20 cm     Diastology LVIDs:         3.00 cm     LV e' medial:    6.42 cm/s LV PW:         1.10 cm     LV E/e' medial:  11.3 LV IVS:        1.20 cm     LV e' lateral:   8.59 cm/s LVOT diam:     2.20 cm     LV E/e' lateral: 8.4 LV SV:         110 LV SV Index:   44          2D Longitudinal Strain LVOT Area:     3.80 cm    2D Strain GLS Avg:     -27.2 %  LV Volumes (MOD) LV vol d, MOD A2C: 62.6 ml LV vol d, MOD A4C: 78.1 ml LV vol s, MOD A2C: 18.8 ml LV vol s, MOD A4C: 21.8 ml LV SV MOD A2C:     43.8 ml LV SV MOD A4C:     78.1 ml LV SV MOD BP:      51.2 ml RIGHT VENTRICLE RV S prime:     13.70 cm/s TAPSE (M-mode): 2.2 cm LEFT ATRIUM             Index       RIGHT ATRIUM           Index LA diam:        3.40 cm 1.34 cm/m  RA Area:     17.80 cm LA Vol (A2C):   61.2 ml 24.16 ml/m RA Volume:   40.80 ml  16.11 ml/m LA Vol (A4C):   56.2 ml 22.18 ml/m LA Biplane Vol:  62.6 ml 24.71 ml/m  AORTIC VALVE LVOT Vmax:   149.00 cm/s LVOT Vmean:  111.000 cm/s LVOT VTI:    0.290 m  AORTA Ao Root diam: 3.40 cm MITRAL VALVE MV Area (PHT): 3.42 cm     SHUNTS MV Decel Time: 222 msec     Systemic VTI:  0.29 m MV E velocity: 72.30 cm/s   Systemic Diam: 2.20 cm MV A velocity: 103.00 cm/s MV E/A ratio:  0.70 Mertie Moores MD Electronically signed by Mertie Moores MD Signature Date/Time: 06/09/2020/4:05:48 PM    Final    MM CLIP PLACEMENT LEFT  Result Date: 05/18/2020 CLINICAL DATA:  Confirmation of clip placement after ultrasound-guided core needle biopsy of a 2.5 cm mass involving the UPPER OUTER QUADRANT of the LEFT breast. EXAM: 2D AND TOMOSYNTHESIS DIAGNOSTIC LEFT MAMMOGRAM POST ULTRASOUND BIOPSY COMPARISON:  Previous exam(s). FINDINGS: Tomosynthesis and synthesized full field CC and mediolateral images were obtained following ultrasound guided biopsy of a mass involving the UPPER OUTER QUADRANT of the LEFT breast. The ribbon shaped tissue marking clip is appropriately positioned within the biopsied mass in the [place]. Expected post biopsy changes are present without evidence of hematoma. IMPRESSION: Appropriate positioning of the ribbon shaped tissue marking clip within the biopsied mass in the [place] of the LEFT breast. Final Assessment: Post Procedure Mammograms for Marker Placement Electronically Signed   By: Evangeline Dakin M.D.   On: 05/18/2020 13:30   [place] LT BREAST BX W LOC DEV 1ST LESION IMG BX SPEC US GUIDE  Addendum Date: 05/23/2020   ADDENDUM REPORT: 05/23/2020 14:54 ADDENDUM: Pathology revealed GRADE III INVASIVE DUCTAL CARCINOMA of the LEFT breast, 1:30 o'clock, 8cmfn, upper outer quadrant. This was found to be concordant by Dr. Peggye Fothergill. Pathology results were discussed with the patient by telephone. The patient reported doing well after the biopsy with tenderness at the site. Post biopsy instructions and care were reviewed and questions were answered. The patient was encouraged to call [facility] for any additional concerns. The patient was referred to [facility] at [facility] on June 01, 2020. Pathology results reported by Stacie Acres RN on 05/23/2020. Electronically Signed   By: Evangeline Dakin M.D.   On: 05/23/2020 14:54   Result Date: 05/23/2020 CLINICAL DATA:  44 year old with a palpable 2.5 cm mass in the UPPER OUTER QUADRANT of the LEFT breast at the 1:30 o'clock position 8 cm from the nipple. EXAM: ULTRASOUND GUIDED LEFT BREAST CORE NEEDLE BIOPSY COMPARISON:  Previous exam(s). PROCEDURE: I met with the patient and we discussed the procedure of ultrasound-guided biopsy, including benefits and alternatives. We discussed the high likelihood of a successful procedure. We discussed the risks of the procedure, including infection, bleeding, tissue injury, clip migration, and inadequate sampling. Informed written consent was given. The usual time-out protocol was performed  immediately prior to the procedure. Lesion quadrant: UPPER OUTER QUADRANT. Using sterile technique with chlorhexidine as skin antisepsis 1% lidocaine and 1% lidocaine with epinephrine as local anesthetic, under direct ultrasound visualization, a 12 gauge Bard Marquee core needle device placed through an 11 gauge introducer needle was used to perform biopsy of the mass in the UPPER OUTER QUADRANT of the LEFT breast using a lateral approach. At the conclusion of the procedure a ribbon shaped tissue marker clip was deployed into the biopsy cavity. Follow up 2 view mammogram was performed and dictated separately. IMPRESSION: Ultrasound guided biopsy of a  2.5 cm mass involving the UPPER OUTER QUADRANT of the LEFT breast. No apparent complications. Electronically Signed: By: Evangeline Dakin M.D. On: 05/18/2020 13:31     ELIGIBLE FOR AVAILABLE RESEARCH PROTOCOL: AET  ASSESSMENT: 44 y.o. [place] woman status post left breast upper outer quadrant biopsy 05/18/2020 for a clinical T2N0, stage IIA invasive ductal carcinoma, grade 3, estrogen receptor weakly positive, progesterone receptor negative, with an MIB-1 of 50%, and HER-2 positive by immunohistochemistry  (1) genetics testing pending  (2) neoadjuvant chemotherapy to consist of carboplatin and docetaxel together with trastuzumab and Pertuzumab every 3 weeks x 6, starting 06/16/2020  (3) trastuzumab to be continued to complete 1 year  (a) echo 06/09/2020 shows EF in the 60-65% range  (4) definitive surgery to follow  (5) adjuvant radiation  (6) antiestrogens   PLAN:  Almeda will have her port placed the day before chemo. She understands the port will be left accessed so she will not need to use the numbing cream this first time. I reassured her the needle would not push trough the back of the port while she sleeps if she happens to roll over on her stomach.  We reviewed the management of diarrhea, which is frequently the most distressing side  effect of her treatment. She will have some loperamide on hand and we discussed how she should take this if diarrhea develops.I also urged her to call us with any questions even if it is late at night, someone is always on call.  I asked her to keep a simple symptom diary starting with the day of treatment so when we review her experience day 8 we can work to make the 2d and subsequent doses better tolerated.  Total encounter time 25 minutes   Sarajane Jews C. Ocie Stanzione, MD 06/10/2020 11:57 AM Medical Oncology and Hematology [facility] [place], [place] [address] Tel. [phone]    Fax. [phone]   This document serves as a record of services personally performed by Lurline Del, MD. It was created on his behalf by Wilburn Mylar, a trained medical scribe. The creation of this record is based on the scribe's personal observations and the provider's statements to them.   I, Lurline Del MD, have reviewed the above documentation for accuracy and completeness, and I agree with the above.   *Total Encounter Time as defined by the Centers for Medicare and Medicaid Services includes, in addition to the face-to-face time of a patient visit (documented in the note above) non-face-to-face time: obtaining and reviewing outside history, ordering and reviewing medications, tests or procedures, care coordination (communications with other health care professionals or caregivers) and documentation in the medical record.

## 2020-06-09 NOTE — Progress Notes (Signed)
[facility] CSW Progress Note  Holiday representative met with patient and daughter, Sheryl Mcmillan, to provide resources. CSW gave patient hard copies of information on general [place] resources as well as breast cancer foundations.  Signed up for [employer] and gave first installment.   Patient will contact CSW with any questions about applications and when ready to submit them.   Christeen Douglas , LCSW

## 2020-06-10 ENCOUNTER — Other Ambulatory Visit: Payer: Self-pay

## 2020-06-10 ENCOUNTER — Inpatient Hospital Stay (HOSPITAL_BASED_OUTPATIENT_CLINIC_OR_DEPARTMENT_OTHER): Payer: Medicaid Other | Admitting: Oncology

## 2020-06-10 ENCOUNTER — Encounter: Payer: Self-pay | Admitting: *Deleted

## 2020-06-10 VITALS — BP 140/93 | HR 87 | Temp 98.2°F | Resp 17 | Ht 71.0 in | Wt 305.9 lb

## 2020-06-10 DIAGNOSIS — C50412 Malignant neoplasm of upper-outer quadrant of left female breast: Secondary | ICD-10-CM | POA: Diagnosis not present

## 2020-06-10 DIAGNOSIS — Z170 Estrogen receptor positive status [ER+]: Secondary | ICD-10-CM | POA: Diagnosis not present

## 2020-06-10 DIAGNOSIS — Z5111 Encounter for antineoplastic chemotherapy: Secondary | ICD-10-CM | POA: Diagnosis not present

## 2020-06-10 NOTE — Progress Notes (Signed)
..  The following Medication: Sheryl Mcmillan is approved for drug replacement program by [employer]. The enrollment period is from 06/10/2020 to 12/09/2020.  Reason for Assistance: Self Pay/ Temporarily approved pending Medicaid status. ID: [id number] First DOS:06/20/2020.

## 2020-06-13 ENCOUNTER — Telehealth: Payer: Self-pay

## 2020-06-13 ENCOUNTER — Other Ambulatory Visit: Payer: Self-pay | Admitting: Radiology

## 2020-06-13 MED FILL — LIDOCAINE-PRILOCAINE CREAM: 2.5-2.5 | 30 days supply | Qty: 30 | Fill #0

## 2020-06-13 NOTE — Progress Notes (Signed)
..  The following Medication: Per[place] is approved for drug replacement program by [place]. The enrollment period is from 06/13/2020 to 06/13/2021.  Reason for Assistance: Self Pay. ID: [id number] First DOS:06/16/2020.

## 2020-06-13 NOTE — Progress Notes (Signed)
..  The following Medication: Sheryl Mcmillan is approved for drug replacement program by [employer] PAP. The enrollment period is from 06/10/2020 to 06/10/2021.  Reason for Assistance: Self Pay. ID: [id number] First DOS:06/16/2020.

## 2020-06-13 NOTE — Telephone Encounter (Signed)
TC from Pt stating that she was informed emla cream is on back order. Pt going to get port placed on Wednesday. Informed Pt that she will not be able to use cream until 2 weeks after port is placed so hopefully cream will be in by that time. until then she can use Ice on the area.  Pt verbalized understanding no further problems or concerns noted.

## 2020-06-14 ENCOUNTER — Telehealth: Payer: Self-pay | Admitting: *Deleted

## 2020-06-14 ENCOUNTER — Encounter: Payer: Self-pay | Admitting: *Deleted

## 2020-06-14 NOTE — Telephone Encounter (Signed)
Spoke to pt concerning future appts and anti-nausea meds. Confirmed port placement with IR and Chemo on 12/23 in [place]. Gave address and contact information. No further needs or questions voiced.

## 2020-06-15 ENCOUNTER — Encounter (HOSPITAL_COMMUNITY): Payer: Self-pay

## 2020-06-15 ENCOUNTER — Other Ambulatory Visit: Payer: No Typology Code available for payment source

## 2020-06-15 ENCOUNTER — Ambulatory Visit (HOSPITAL_COMMUNITY)
Admission: RE | Admit: 2020-06-15 | Discharge: 2020-06-15 | Disposition: A | Discharge status: Home / Self Care | Payer: Medicaid Other | Source: Ambulatory Visit | Attending: Oncology | Admitting: Oncology

## 2020-06-15 ENCOUNTER — Inpatient Hospital Stay: Payer: Medicaid Other

## 2020-06-15 ENCOUNTER — Other Ambulatory Visit: Payer: Self-pay

## 2020-06-15 ENCOUNTER — Ambulatory Visit: Payer: No Typology Code available for payment source

## 2020-06-15 ENCOUNTER — Other Ambulatory Visit: Payer: Self-pay | Admitting: *Deleted

## 2020-06-15 ENCOUNTER — Other Ambulatory Visit: Payer: Self-pay | Admitting: Oncology

## 2020-06-15 DIAGNOSIS — Z419 Encounter for procedure for purposes other than remedying health state, unspecified: Secondary | ICD-10-CM

## 2020-06-15 DIAGNOSIS — Z170 Estrogen receptor positive status [ER+]: Secondary | ICD-10-CM | POA: Insufficient documentation

## 2020-06-15 DIAGNOSIS — Z803 Family history of malignant neoplasm of breast: Secondary | ICD-10-CM | POA: Insufficient documentation

## 2020-06-15 DIAGNOSIS — Z809 Family history of malignant neoplasm, unspecified: Secondary | ICD-10-CM | POA: Insufficient documentation

## 2020-06-15 DIAGNOSIS — Z7952 Long term (current) use of systemic steroids: Secondary | ICD-10-CM | POA: Insufficient documentation

## 2020-06-15 DIAGNOSIS — F17210 Nicotine dependence, cigarettes, uncomplicated: Secondary | ICD-10-CM | POA: Diagnosis not present

## 2020-06-15 DIAGNOSIS — C50412 Malignant neoplasm of upper-outer quadrant of left female breast: Secondary | ICD-10-CM

## 2020-06-15 DIAGNOSIS — Z807 Family history of other malignant neoplasms of lymphoid, hematopoietic and related tissues: Secondary | ICD-10-CM | POA: Diagnosis not present

## 2020-06-15 DIAGNOSIS — Z5111 Encounter for antineoplastic chemotherapy: Secondary | ICD-10-CM | POA: Diagnosis not present

## 2020-06-15 HISTORY — PX: IR IMAGING GUIDED PORT INSERTION: IMG5740

## 2020-06-15 LAB — CBC WITH DIFFERENTIAL (CANCER CENTER ONLY)
Abs Immature Granulocytes: 0.04 10*3/uL (ref 0.00–0.07)
Basophils Absolute: 0.1 10*3/uL (ref 0.0–0.1)
Basophils Relative: 1 %
Eosinophils Absolute: 0.1 10*3/uL (ref 0.0–0.5)
Eosinophils Relative: 1 %
HCT: 42.3 % (ref 36.0–46.0)
Hemoglobin: 13.7 g/dL (ref 12.0–15.0)
Immature Granulocytes: 1 %
Lymphocytes Relative: 17 %
Lymphs Abs: 1.4 10*3/uL (ref 0.7–4.0)
MCH: 28 pg (ref 26.0–34.0)
MCHC: 32.4 g/dL (ref 30.0–36.0)
MCV: 86.5 fL (ref 80.0–100.0)
Monocytes Absolute: 0.2 10*3/uL (ref 0.1–1.0)
Monocytes Relative: 3 %
Neutro Abs: 6.6 10*3/uL (ref 1.7–7.7)
Neutrophils Relative %: 77 %
Platelet Count: 345 10*3/uL (ref 150–400)
RBC: 4.89 MIL/uL (ref 3.87–5.11)
RDW: 14.4 % (ref 11.5–15.5)
WBC Count: 8.5 10*3/uL (ref 4.0–10.5)
nRBC: 0 % (ref 0.0–0.2)

## 2020-06-15 LAB — PROTIME-INR
INR: 1 (ref 0.8–1.2)
Prothrombin Time: 13 seconds (ref 11.4–15.2)

## 2020-06-15 LAB — HCG, SERUM, QUALITATIVE: Preg, Serum: NEGATIVE

## 2020-06-15 MED ORDER — MIDAZOLAM HCL 2 MG/2ML IJ SOLN
INTRAMUSCULAR | Status: AC | PRN
  Administered 2020-06-15 (×4): 1 mg via INTRAVENOUS

## 2020-06-15 MED ORDER — SODIUM CHLORIDE 0.9 % IV SOLN: INTRAVENOUS | Status: DC

## 2020-06-15 MED ORDER — HEPARIN SOD (PORK) LOCK FLUSH 100 UNIT/ML IV SOLN
INTRAVENOUS | Status: AC | PRN
  Administered 2020-06-15: 500 [IU] via INTRAVENOUS

## 2020-06-15 MED ORDER — MIDAZOLAM HCL 2 MG/2ML IJ SOLN
INTRAMUSCULAR | Status: AC
  Filled 2020-06-15: qty 4

## 2020-06-15 MED ORDER — CEFAZOLIN SODIUM-DEXTROSE 2-4 GM/100ML-% IV SOLN
2.0000 g | INTRAVENOUS | Status: AC
  Administered 2020-06-15: 14:00:00 2 g via INTRAVENOUS
  Filled 2020-06-15: qty 100

## 2020-06-15 MED ORDER — LIDOCAINE-EPINEPHRINE 1 %-1:100000 IJ SOLN
INTRAMUSCULAR | Status: AC
  Filled 2020-06-15: qty 1

## 2020-06-15 MED ORDER — HEPARIN SOD (PORK) LOCK FLUSH 100 UNIT/ML IV SOLN
INTRAVENOUS | Status: AC
  Filled 2020-06-15: qty 5

## 2020-06-15 MED ORDER — LIDOCAINE-EPINEPHRINE 1 %-1:100000 IJ SOLN
INTRAMUSCULAR | Status: AC | PRN
  Administered 2020-06-15: 20 mL

## 2020-06-15 MED ORDER — FENTANYL CITRATE (PF) 100 MCG/2ML IJ SOLN
INTRAMUSCULAR | Status: AC | PRN
  Administered 2020-06-15 (×2): 50 ug via INTRAVENOUS

## 2020-06-15 MED ORDER — FENTANYL CITRATE (PF) 100 MCG/2ML IJ SOLN
INTRAMUSCULAR | Status: AC
  Filled 2020-06-15: qty 2

## 2020-06-15 NOTE — Consult Note (Signed)
Chief Complaint: "I'm here for a port a cath"  Referring Physician(s): Magrinat,Gustav C  Supervising Physician: Arne Cleveland  Patient Status: [facility] - Out-pt  History of Present Illness: Sheryl Mcmillan is a 44 y.o. female smoker with past medical history of arthritis, migraines, obesity, polycystic ovarian syndrome and newly diagnosed left breast carcinoma who presents today for Port-A-Cath placement for chemotherapy.  Past Medical History:  Diagnosis Date  . Arthritis   . Breast cancer ([place])   . Family history of non-Hodgkin's lymphoma 06/01/2020  . Hemorrhoids   . Migraines   . Obesity   . PCOS (polycystic ovarian syndrome)     Past Surgical History:  Procedure Laterality Date  . COSMETIC SURGERY    . DILATION AND CURETTAGE OF UTERUS    . HEMORRHOID SURGERY      Allergies: [place]rest tc-baking soda [sodium fluoride]  Medications: Prior to Admission medications   Medication Sig Start Date End Date Taking? Authorizing Provider  acetaminophen (TYLENOL) 325 MG tablet Take 650 mg by mouth every 6 (six) hours as needed.    [provider]  DAILY MULTIPLE VITAMINS PO Take by mouth.    [provider]  dexamethasone (DECADRON) 4 MG tablet Take 2 tablets (8 mg total) by mouth 2 (two) times daily. Start the day before Taxotere. Then take daily x 3 days after chemotherapy. 06/09/20   Magrinat, [name], MD  Homeopathic Products (LIVER SUPPORT SL) Place under the tongue.    [provider]  lidocaine-prilocaine (EMLA) cream Apply to affected area once 06/09/20   Magrinat, [name], MD  Phenylephrine-Aspirin (ALKA-SELTZER PLUS SINUS PO) Take by mouth.    [provider]  prochlorperazine (COMPAZINE) 10 MG tablet Take 1 tablet (10 mg total) by mouth every 6 (six) hours as needed (Nausea or vomiting). 06/09/20   Magrinat, [name], MD     Family History  Problem Relation Age of Onset  . Hypertension Mother   . Diabetes Mother   . Cancer  Mother 48       unknown primary  . Other Mother        brain tumor; dx 48s  . Non-Hodgkin's lymphoma Brother 29  . Other Maternal Uncle 52       brain tumor  . Breast cancer Other        MGM's niece; dx late 24s    Social History   Socioeconomic History  . Marital status: Single    Spouse name: Not on file  . Number of children: 1  . Years of education: Not on file  . Highest education level: Some college, no degree  Occupational History  . Not on file  Tobacco Use  . Smoking status: Current Every Day Smoker    Packs/day: 0.50    Years: 10.00    Pack years: 5.00    Types: Cigarettes  . Smokeless tobacco: Never Used  Vaping Use  . Vaping Use: Never used  Substance and Sexual Activity  . Alcohol use: Yes    Comment: occasionally  . Drug use: Yes    Types: Marijuana  . Sexual activity: Yes    Birth control/protection: None  Other Topics Concern  . Not on file  Social History Narrative  . Not on file   Social Determinants of Health   Financial Resource Strain: Not on file  Food Insecurity: Not on file  Transportation Needs: No Transportation Needs  . Lack of Transportation (Medical): No  . Lack of Transportation (Non-Medical): No  Physical Activity: Not on file  Stress: Not on file  Social Connections: Not on file      Review of Systems:  denies fever, dyspnea, cough, back pain, nausea, vomiting or bleeding; does have occasional headaches and intermittent abdominal discomfort  Vital Signs: Vitals:   06/15/20 1157  BP: 130/90  Pulse: 88  Resp: 16  Temp: 98.4 F (36.9 C)  SpO2: 97%      Physical Exam awake, alert.  Chest clear to auscultation bilaterally.  Heart with regular rate and rhythm.  Abdomen obese, soft, positive bowel sounds, currently nontender.  No significant lower extremity edema.  Imaging: ECHOCARDIOGRAM COMPLETE  Result Date: 06/09/2020    ECHOCARDIOGRAM REPORT   Patient Name:   Sheryl Mcmillan Date of Exam: 06/09/2020 Medical Rec  #:  [id number]       Height:       71.0 in Accession #:    [id number]      Weight:       308.0 lb Date of Birth:  1976/06/03        BSA:          2.533 m Patient Age:    56 years        BP:           118/79 mmHg Patient Gender: F               HR:           83 bpm. Exam Location:  Outpatient Procedure: 2D Echo, 3D Echo, Cardiac Doppler, Color Doppler and Strain Analysis Indications:    Z51.11 Encounter for antineoplastic chemotheraphy  History:        Patient has no prior history of Echocardiogram examinations. No                 prior cardiac history. Breast cacner.  Sonographer:    Roseanna Rainbow Referring Phys: [phone] GUSTAV C MAGRINAT  Sonographer Comments: Technically difficult study due to poor echo windows, patient is morbidly obese, suboptimal parasternal window and no subcostal window. Image acquisition challenging due to patient body habitus. Global longitudinal strain was attempted. IMPRESSIONS  1. Left ventricular ejection fraction, by estimation, is 65 to 70%. The left ventricle has normal function. The left ventricle has no regional wall motion abnormalities. Left ventricular diastolic parameters are consistent with Grade I diastolic dysfunction (impaired relaxation).  2. Right ventricular systolic function is normal. The right ventricular size is normal.  3. The mitral valve is normal in structure. Trivial mitral valve regurgitation.  4. The aortic valve is normal in structure. Aortic valve regurgitation is not visualized. FINDINGS  Left Ventricle: Left ventricular ejection fraction, by estimation, is 65 to 70%. The left ventricle has normal function. The left ventricle has no regional wall motion abnormalities. The left ventricular internal cavity size was normal in size. There is  no left ventricular hypertrophy. Left ventricular diastolic parameters are consistent with Grade I diastolic dysfunction (impaired relaxation). Right Ventricle: The right ventricular size is normal. No increase in right ventricular  wall thickness. Right ventricular systolic function is normal. Left Atrium: Left atrial size was normal in size. Right Atrium: Right atrial size was normal in size. Pericardium: There is no evidence of pericardial effusion. Mitral Valve: The mitral valve is normal in structure. Trivial mitral valve regurgitation. Tricuspid Valve: The tricuspid valve is grossly normal. Tricuspid valve regurgitation is not demonstrated. Aortic Valve: The aortic valve is normal in structure. Aortic valve regurgitation is not visualized. Pulmonic Valve:  The pulmonic valve was normal in structure. Pulmonic valve regurgitation is not visualized. Aorta: The aortic root and ascending aorta are structurally normal, with no evidence of dilitation. IAS/Shunts: The atrial septum is grossly normal.  LEFT VENTRICLE PLAX 2D LVIDd:         5.20 cm     Diastology LVIDs:         3.00 cm     LV e' medial:    6.42 cm/s LV PW:         1.10 cm     LV E/e' medial:  11.3 LV IVS:        1.20 cm     LV e' lateral:   8.59 cm/s LVOT diam:     2.20 cm     LV E/e' lateral: 8.4 LV SV:         110 LV SV Index:   44          2D Longitudinal Strain LVOT Area:     3.80 cm    2D Strain GLS Avg:     -27.2 %  LV Volumes (MOD) LV vol d, MOD A2C: 62.6 ml LV vol d, MOD A4C: 78.1 ml LV vol s, MOD A2C: 18.8 ml LV vol s, MOD A4C: 21.8 ml LV SV MOD A2C:     43.8 ml LV SV MOD A4C:     78.1 ml LV SV MOD BP:      51.2 ml RIGHT VENTRICLE RV S prime:     13.70 cm/s TAPSE (M-mode): 2.2 cm LEFT ATRIUM             Index       RIGHT ATRIUM           Index LA diam:        3.40 cm 1.34 cm/m  RA Area:     17.80 cm LA Vol (A2C):   61.2 ml 24.16 ml/m RA Volume:   40.80 ml  16.11 ml/m LA Vol (A4C):   56.2 ml 22.18 ml/m LA Biplane Vol: 62.6 ml 24.71 ml/m  AORTIC VALVE LVOT Vmax:   149.00 cm/s LVOT Vmean:  111.000 cm/s LVOT VTI:    0.290 m  AORTA Ao Root diam: 3.40 cm MITRAL VALVE MV Area (PHT): 3.42 cm     SHUNTS MV Decel Time: 222 msec     Systemic VTI:  0.29 m MV E velocity: 72.30  cm/s   Systemic Diam: 2.20 cm MV A velocity: 103.00 cm/s MV E/A ratio:  0.70 Mertie Moores MD Electronically signed by Mertie Moores MD Signature Date/Time: 06/09/2020/4:05:48 PM    Final    MM CLIP PLACEMENT LEFT  Result Date: 05/18/2020 CLINICAL DATA:  Confirmation of clip placement after ultrasound-guided core needle biopsy of a 2.5 cm mass involving the UPPER OUTER QUADRANT of the LEFT breast. EXAM: 2D AND TOMOSYNTHESIS DIAGNOSTIC LEFT MAMMOGRAM POST ULTRASOUND BIOPSY COMPARISON:  Previous exam(s). FINDINGS: Tomosynthesis and synthesized full field CC and mediolateral images were obtained following ultrasound guided biopsy of a mass involving the UPPER OUTER QUADRANT of the LEFT breast. The ribbon shaped tissue marking clip is appropriately positioned within the biopsied mass in the [place]. Expected post biopsy changes are present without evidence of hematoma. IMPRESSION: Appropriate positioning of the ribbon shaped tissue marking clip within the biopsied mass in the [place] of the LEFT breast. Final Assessment: Post Procedure Mammograms for Marker Placement Electronically Signed   By: Evangeline Dakin M.D.   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 This was found to be concordant by Dr. Peggye Fothergill. Pathology results were discussed with the patient by telephone. The patient reported doing well after the biopsy with tenderness at the site. Post biopsy instructions and care were reviewed and questions were answered. The patient was encouraged to call [facility] for any additional concerns. The patient was referred to [facility] on June 01, 2020. Pathology results reported by Stacie Acres RN on 05/23/2020. Electronically Signed   By: Evangeline Dakin M.D.   On: 05/23/2020 14:54   Result Date: 05/23/2020 CLINICAL DATA:  44 year old with a palpable 2.5 cm mass in the UPPER OUTER QUADRANT of the LEFT breast at the 1:30 o'clock position 8 cm from the nipple. EXAM: ULTRASOUND GUIDED LEFT BREAST CORE NEEDLE BIOPSY COMPARISON:  Previous exam(s). PROCEDURE: I met with the patient and we discussed the procedure of ultrasound-guided biopsy, including benefits and alternatives. We discussed the high likelihood of a successful procedure. We discussed the risks of the procedure, including infection, bleeding, tissue injury, clip migration, and inadequate sampling. Informed written consent was given. The usual time-out protocol was performed immediately prior to the procedure. Lesion quadrant: UPPER OUTER QUADRANT. Using sterile technique with chlorhexidine as skin antisepsis 1% lidocaine and 1% lidocaine with epinephrine as local anesthetic, under direct ultrasound visualization, a 12 gauge Bard Marquee core needle device placed through an 11 gauge introducer needle was used to perform biopsy of the mass in the UPPER OUTER QUADRANT of the LEFT breast using a lateral approach. At the conclusion of the procedure a ribbon shaped tissue marker clip was deployed into the biopsy cavity. Follow up 2 view mammogram was performed and dictated separately. IMPRESSION: Ultrasound guided biopsy of a 2.5 cm mass involving the UPPER OUTER QUADRANT of the LEFT breast. No apparent complications. Electronically Signed: By: Evangeline Dakin M.D. On: 05/18/2020 13:31    Labs:  CBC: Recent Labs    06/01/20 1232 06/15/20 1102  WBC 10.9* 8.5  HGB 13.5 13.7  HCT 41.4 42.3  PLT 377 345    COAGS: Recent Labs    06/15/20 1103  INR 1.0    BMP: Recent Labs    06/01/20 1232  NA 139  K 4.0  CL 107  CO2 22  GLUCOSE 150*  BUN 12  CALCIUM 9.5  CREATININE 0.91   GFRNONAA >60    LIVER FUNCTION TESTS: Recent Labs    06/01/20 1232  BILITOT 0.3  AST 15  ALT 19  ALKPHOS 83  PROT 7.1  ALBUMIN 3.6    TUMOR MARKERS: No results for input(s): AFPTM, CEA, CA199, CHROMGRNA in the last 8760 hours.  Assessment and Plan: 44 y.o. female smoker with past medical history of arthritis, migraines, obesity, polycystic ovarian syndrome and newly diagnosed left breast carcinoma who presents today for Port-A-Cath placement for chemotherapy.Risks and benefits of image guided port-a-catheter placement was discussed with the patient including, but not limited to bleeding, infection, pneumothorax, or fibrin sheath development and need for additional procedures.  All of the patient's questions were answered, patient is agreeable to proceed. Consent signed and in chart.     Thank you for this interesting consult.  I greatly enjoyed meeting [employer] and look forward to participating in their care.  A copy of this report was sent to the requesting provider on this date.  Electronically Signed: D. Rowe Robert, PA-C 06/15/2020, 11:51 AM   I spent a total of  25 minutes   in face to face in clinical consultation, greater than 50% of which was counseling/coordinating care for Port-A-Cath placement

## 2020-06-15 NOTE — Procedures (Signed)
  Procedure: R IJ Port catheter placement   EBL:   minimal Complications:  none immediate  See full dictation in [facility] PACS.  D. Tanny Harnack MD Main # [phone] Pager  [phone] Mobile [phone]     

## 2020-06-15 NOTE — Discharge Instructions (Signed)
Urgent needs - IR on call MD [phone]  Wound - May remove dressing and shower in 24 to 48 hours.  Keep site clean and dry.  Replace with bandaid. Do not submerge in tub or water until site healing well.  If ordered by your provider, may start Emla cream in 2 weeks or after incision is healed.  After completion of treatment, your provider should have you set up for monthly port flushes.    Implanted Port Insertion, Care After This sheet gives you information about how to care for yourself after your procedure. Your health care provider may also give you more specific instructions. If you have problems or questions, contact your health care provider. What can I expect after the procedure? After the procedure, it is common to have:  Discomfort at the port insertion site.  Bruising on the skin over the port. This should improve over 3-4 days. Follow these instructions at home: Port care  After your port is placed, you will get a manufacturer's information card. The card has information about your port. Keep this card with you at all times.  Take care of the port as told by your health care provider. Ask your health care provider if you or a family member can get training for taking care of the port at home. A home health care nurse may also take care of the port.  Make sure to remember what type of port you have. Incision care  Follow instructions from your health care provider about how to take care of your port insertion site. Make sure you: ? Wash your hands with soap and water before and after you change your bandage (dressing). If soap and water are not available, use hand sanitizer. ? Change your dressing as told by your health care provider. ? Leave stitches (sutures), skin glue, or adhesive strips in place. These skin closures may need to stay in place for 2 weeks or longer. If adhesive strip edges start to loosen and curl up, you may trim the loose edges. Do not remove adhesive  strips completely unless your health care provider tells you to do that.  Check your port insertion site every day for signs of infection. Check for: ? Redness, swelling, or pain. ? Fluid or blood. ? Warmth. ? Pus or a bad smell. Activity  Return to your normal activities as told by your health care provider. Ask your health care provider what activities are safe for you.  Do not lift anything that is heavier than 10 lb (4.5 kg), or the limit that you are told, until your health care provider says that it is safe. General instructions  Take over-the-counter and prescription medicines only as told by your health care provider.  Do not take baths, swim, or use a hot tub until your health care provider approves. Ask your health care provider if you may take showers. You may only be allowed to take sponge baths.  Do not drive for 24 hours if you were given a sedative during your procedure.  Wear a medical alert bracelet in case of an emergency. This will tell any health care providers that you have a port.  Keep all follow-up visits as told by your health care provider. This is important. Contact a health care provider if:  You cannot flush your port with saline as directed, or you cannot draw blood from the port.  You have a fever or chills.  You have redness, swelling, or pain around your port   insertion site.  You have fluid or blood coming from your port insertion site.  Your port insertion site feels warm to the touch.  You have pus or a bad smell coming from the port insertion site. Get help right away if:  You have chest pain or shortness of breath.  You have bleeding from your port that you cannot control. Summary  Take care of the port as told by your health care provider. Keep the manufacturer's information card with you at all times.  Change your dressing as told by your health care provider.  Contact a health care provider if you have a fever or chills or if you  have redness, swelling, or pain around your port insertion site.  Keep all follow-up visits as told by your health care provider. This information is not intended to replace advice given to you by your health care provider. Make sure you discuss any questions you have with your health care provider. Document Revised: 01/07/2018 Document Reviewed: 01/07/2018 Elsevier Patient Education  2020 Elsevier Inc.   Moderate Conscious Sedation, Adult, Care After These instructions provide you with information about caring for yourself after your procedure. Your health care provider may also give you more specific instructions. Your treatment has been planned according to current medical practices, but problems sometimes occur. Call your health care provider if you have any problems or questions after your procedure. What can I expect after the procedure? After your procedure, it is common:  To feel sleepy for several hours.  To feel clumsy and have poor balance for several hours.  To have poor judgment for several hours.  To vomit if you eat too soon. Follow these instructions at home: For at least 24 hours after the procedure:  Do not: ? Participate in activities where you could fall or become injured. ? Drive. ? Use heavy machinery. ? Drink alcohol. ? Take sleeping pills or medicines that cause drowsiness. ? Make important decisions or sign legal documents. ? Take care of children on your own.  Rest. Eating and drinking  Follow the diet recommended by your health care provider.  If you vomit: ? Drink water, juice, or soup when you can drink without vomiting. ? Make sure you have little or no nausea before eating solid foods. General instructions  Have a responsible adult stay with you until you are awake and alert.  Take over-the-counter and prescription medicines only as told by your health care provider.  If you smoke, do not smoke without supervision.  Keep all follow-up  visits as told by your health care provider. This is important. Contact a health care provider if:  You keep feeling nauseous or you keep vomiting.  You feel light-headed.  You develop a rash.  You have a fever. Get help right away if:  You have trouble breathing. This information is not intended to replace advice given to you by your health care provider. Make sure you discuss any questions you have with your health care provider. Document Revised: 05/24/2017 Document Reviewed: 10/01/2015 Elsevier Patient Education  2020 Elsevier Inc.   

## 2020-06-16 ENCOUNTER — Telehealth: Payer: Self-pay | Admitting: Genetic Counselor

## 2020-06-16 ENCOUNTER — Inpatient Hospital Stay: Payer: Medicaid Other

## 2020-06-16 ENCOUNTER — Other Ambulatory Visit: Payer: Self-pay | Admitting: Oncology

## 2020-06-16 VITALS — BP 137/84 | HR 83 | Temp 97.8°F | Resp 17

## 2020-06-16 DIAGNOSIS — Z5111 Encounter for antineoplastic chemotherapy: Secondary | ICD-10-CM | POA: Diagnosis not present

## 2020-06-16 DIAGNOSIS — Z170 Estrogen receptor positive status [ER+]: Secondary | ICD-10-CM

## 2020-06-16 MED ORDER — SODIUM CHLORIDE 0.9 % IV SOLN
150.0000 mg | Freq: Once | INTRAVENOUS | Status: AC
  Administered 2020-06-16: 09:00:00 150 mg via INTRAVENOUS
  Filled 2020-06-16: qty 150

## 2020-06-16 MED ORDER — PALONOSETRON HCL INJECTION 0.25 MG/5ML
INTRAVENOUS | Status: AC
  Filled 2020-06-16: qty 5

## 2020-06-16 MED ORDER — SODIUM CHLORIDE 0.9 % IV SOLN
75.0000 mg/m2 | Freq: Once | INTRAVENOUS | Status: AC
  Administered 2020-06-16: 15:00:00 200 mg via INTRAVENOUS
  Filled 2020-06-16: qty 20

## 2020-06-16 MED ORDER — SODIUM CHLORIDE 0.9% FLUSH
10.0000 mL | INTRAVENOUS | Status: DC | PRN
  Administered 2020-06-16: 17:00:00 10 mL
  Filled 2020-06-16: qty 10

## 2020-06-16 MED ORDER — ACETAMINOPHEN 325 MG PO TABS
ORAL_TABLET | ORAL | Status: AC
  Filled 2020-06-16: qty 2

## 2020-06-16 MED ORDER — SODIUM CHLORIDE 0.9 % IV SOLN
Freq: Once | INTRAVENOUS | Status: AC
  Filled 2020-06-16: qty 250

## 2020-06-16 MED ORDER — SODIUM CHLORIDE 0.9 % IV SOLN
8.0000 mg/kg | Freq: Once | INTRAVENOUS | Status: AC
  Administered 2020-06-16: 11:00:00 1113 mg via INTRAVENOUS
  Filled 2020-06-16: qty 53

## 2020-06-16 MED ORDER — SODIUM CHLORIDE 0.9 % IV SOLN
420.0000 mg | Freq: Once | INTRAVENOUS | Status: AC
  Administered 2020-06-16: 12:00:00 420 mg via INTRAVENOUS
  Filled 2020-06-16: qty 14

## 2020-06-16 MED ORDER — SODIUM CHLORIDE 0.9 % IV SOLN
10.0000 mg | Freq: Once | INTRAVENOUS | Status: AC
  Administered 2020-06-16: 10:00:00 10 mg via INTRAVENOUS
  Filled 2020-06-16: qty 10

## 2020-06-16 MED ORDER — PALONOSETRON HCL INJECTION 0.25 MG/5ML
0.2500 mg | Freq: Once | INTRAVENOUS | Status: AC
  Administered 2020-06-16: 09:00:00 0.25 mg via INTRAVENOUS

## 2020-06-16 MED ORDER — DIPHENHYDRAMINE HCL 25 MG PO CAPS
25.0000 mg | ORAL_CAPSULE | Freq: Once | ORAL | Status: AC
  Administered 2020-06-16: 09:00:00 25 mg via ORAL

## 2020-06-16 MED ORDER — DIPHENHYDRAMINE HCL 25 MG PO CAPS
ORAL_CAPSULE | ORAL | Status: AC
  Filled 2020-06-16: qty 1

## 2020-06-16 MED ORDER — ACETAMINOPHEN 325 MG PO TABS
650.0000 mg | ORAL_TABLET | Freq: Once | ORAL | Status: AC
  Administered 2020-06-16: 09:00:00 650 mg via ORAL

## 2020-06-16 MED ORDER — HEPARIN SOD (PORK) LOCK FLUSH 100 UNIT/ML IV SOLN
500.0000 [IU] | Freq: Once | INTRAVENOUS | Status: AC | PRN
  Administered 2020-06-16: 17:00:00 500 [IU]
  Filled 2020-06-16: qty 5

## 2020-06-16 MED ORDER — SODIUM CHLORIDE 0.9 % IV SOLN
750.0000 mg | Freq: Once | INTRAVENOUS | Status: AC
  Administered 2020-06-16: 16:00:00 750 mg via INTRAVENOUS
  Filled 2020-06-16: qty 75

## 2020-06-16 NOTE — Patient Instructions (Addendum)
[facility] Discharge Instructions for Patients Receiving Chemotherapy  Today you received the following chemotherapy agents Carboplatin, Taxotere, Ogivri, Perjeta  To help prevent nausea and vomiting after your treatment, we encourage you to take your nausea medication as directed If you develop nausea and vomiting that is not controlled by your nausea medication, call the clinic.   BELOW ARE SYMPTOMS THAT SHOULD BE REPORTED IMMEDIATELY:  *FEVER GREATER THAN 100.5 F  *CHILLS WITH OR WITHOUT FEVER  NAUSEA AND VOMITING THAT IS NOT CONTROLLED WITH YOUR NAUSEA MEDICATION  *UNUSUAL SHORTNESS OF BREATH  *UNUSUAL BRUISING OR BLEEDING  TENDERNESS IN MOUTH AND THROAT WITH OR WITHOUT PRESENCE OF ULCERS  *URINARY PROBLEMS  *BOWEL PROBLEMS  UNUSUAL RASH Items with * indicate a potential emergency and should be followed up as soon as possible.  Feel free to call the clinic should you have any questions or concerns. The clinic phone number is [phone].  Please show the [place] at check-in to the Emergency Department and triage nurse.

## 2020-06-16 NOTE — Telephone Encounter (Signed)
Contacted patient in attempt to disclose results of genetic testing.  LVM with contact information requesting a call back.  

## 2020-06-20 ENCOUNTER — Other Ambulatory Visit: Payer: Self-pay | Admitting: Oncology

## 2020-06-20 ENCOUNTER — Inpatient Hospital Stay: Payer: Medicaid Other

## 2020-06-20 ENCOUNTER — Other Ambulatory Visit: Payer: Self-pay

## 2020-06-20 VITALS — BP 130/90 | HR 67 | Temp 97.0°F | Resp 18 | Ht 71.0 in | Wt 301.6 lb

## 2020-06-20 DIAGNOSIS — Z5111 Encounter for antineoplastic chemotherapy: Secondary | ICD-10-CM | POA: Diagnosis not present

## 2020-06-20 DIAGNOSIS — Z170 Estrogen receptor positive status [ER+]: Secondary | ICD-10-CM

## 2020-06-20 MED ORDER — PEGFILGRASTIM-CBQV 6 MG/0.6ML ~~LOC~~ SOSY
6.0000 mg | PREFILLED_SYRINGE | Freq: Once | SUBCUTANEOUS | Status: AC
  Administered 2020-06-20: 10:00:00 6 mg via SUBCUTANEOUS

## 2020-06-20 MED ORDER — PEGFILGRASTIM-CBQV 6 MG/0.6ML ~~LOC~~ SOSY
PREFILLED_SYRINGE | SUBCUTANEOUS | Status: AC
  Filled 2020-06-20: qty 0.6

## 2020-06-20 NOTE — Patient Instructions (Signed)

## 2020-06-21 ENCOUNTER — Telehealth: Payer: Self-pay | Admitting: *Deleted

## 2020-06-21 ENCOUNTER — Other Ambulatory Visit: Payer: Self-pay

## 2020-06-21 ENCOUNTER — Other Ambulatory Visit: Payer: Self-pay | Admitting: Medical

## 2020-06-21 ENCOUNTER — Inpatient Hospital Stay: Payer: Medicaid Other

## 2020-06-21 ENCOUNTER — Inpatient Hospital Stay (HOSPITAL_BASED_OUTPATIENT_CLINIC_OR_DEPARTMENT_OTHER): Payer: Medicaid Other | Admitting: Medical

## 2020-06-21 VITALS — BP 138/84 | HR 75 | Temp 98.1°F | Resp 18

## 2020-06-21 DIAGNOSIS — Z95828 Presence of other vascular implants and grafts: Secondary | ICD-10-CM

## 2020-06-21 DIAGNOSIS — Z5111 Encounter for antineoplastic chemotherapy: Secondary | ICD-10-CM | POA: Diagnosis not present

## 2020-06-21 DIAGNOSIS — R197 Diarrhea, unspecified: Secondary | ICD-10-CM | POA: Diagnosis not present

## 2020-06-21 DIAGNOSIS — Z170 Estrogen receptor positive status [ER+]: Secondary | ICD-10-CM

## 2020-06-21 DIAGNOSIS — R112 Nausea with vomiting, unspecified: Secondary | ICD-10-CM

## 2020-06-21 DIAGNOSIS — C50412 Malignant neoplasm of upper-outer quadrant of left female breast: Secondary | ICD-10-CM

## 2020-06-21 LAB — CBC WITH DIFFERENTIAL (CANCER CENTER ONLY)
Abs Immature Granulocytes: 1.01 10*3/uL — ABNORMAL HIGH (ref 0.00–0.07)
Basophils Absolute: 0 10*3/uL (ref 0.0–0.1)
Basophils Relative: 0 %
Eosinophils Absolute: 0.1 10*3/uL (ref 0.0–0.5)
Eosinophils Relative: 1 %
HCT: 43.8 % (ref 36.0–46.0)
Hemoglobin: 14.6 g/dL (ref 12.0–15.0)
Immature Granulocytes: 7 %
Lymphocytes Relative: 11 %
Lymphs Abs: 1.7 10*3/uL (ref 0.7–4.0)
MCH: 27.7 pg (ref 26.0–34.0)
MCHC: 33.3 g/dL (ref 30.0–36.0)
MCV: 83.1 fL (ref 80.0–100.0)
Monocytes Absolute: 0.1 10*3/uL (ref 0.1–1.0)
Monocytes Relative: 0 %
Neutro Abs: 12.3 10*3/uL — ABNORMAL HIGH (ref 1.7–7.7)
Neutrophils Relative %: 81 %
Platelet Count: 364 10*3/uL (ref 150–400)
RBC: 5.27 MIL/uL — ABNORMAL HIGH (ref 3.87–5.11)
RDW: 13.4 % (ref 11.5–15.5)
WBC Count: 15.3 10*3/uL — ABNORMAL HIGH (ref 4.0–10.5)
nRBC: 0 % (ref 0.0–0.2)

## 2020-06-21 LAB — CMP (CANCER CENTER ONLY)
ALT: 33 U/L (ref 0–44)
AST: 18 U/L (ref 15–41)
Albumin: 3.7 g/dL (ref 3.5–5.0)
Alkaline Phosphatase: 74 U/L (ref 38–126)
Anion gap: 7 (ref 5–15)
BUN: 13 mg/dL (ref 6–20)
CO2: 23 mmol/L (ref 22–32)
Calcium: 8.5 mg/dL — ABNORMAL LOW (ref 8.9–10.3)
Chloride: 105 mmol/L (ref 98–111)
Creatinine: 0.73 mg/dL (ref 0.44–1.00)
GFR, Estimated: >60 mL/min (ref >60)
Glucose, Bld: 141 mg/dL — ABNORMAL HIGH (ref 70–99)
Potassium: 4.1 mmol/L (ref 3.5–5.1)
Sodium: 135 mmol/L (ref 135–145)
Total Bilirubin: 0.5 mg/dL (ref 0.3–1.2)
Total Protein: 7.1 g/dL (ref 6.5–8.1)

## 2020-06-21 LAB — MAGNESIUM: Magnesium: 2.1 mg/dL (ref 1.7–2.4)

## 2020-06-21 MED ORDER — SODIUM CHLORIDE 0.9% FLUSH
10.0000 mL | INTRAVENOUS | Status: DC | PRN
  Filled 2020-06-21: qty 10

## 2020-06-21 MED ORDER — DIPHENOXYLATE-ATROPINE 2.5-0.025 MG PO TABS: ORAL_TABLET | ORAL | 2 refills | Status: DC

## 2020-06-21 MED ORDER — LORAZEPAM 0.5 MG PO TABS: 0.5000 mg | ORAL_TABLET | Freq: Four times a day (QID) | ORAL | 0 refills | Status: DC | PRN

## 2020-06-21 MED ORDER — SODIUM CHLORIDE 0.9 % IV SOLN
INTRAVENOUS | Status: AC
  Filled 2020-06-21 (×2): qty 250

## 2020-06-21 MED ORDER — DIPHENOXYLATE-ATROPINE 2.5-0.025 MG PO TABS
ORAL_TABLET | ORAL | Status: AC
  Filled 2020-06-21: qty 1

## 2020-06-21 MED ORDER — DIPHENOXYLATE-ATROPINE 2.5-0.025 MG PO TABS
1.0000 | ORAL_TABLET | Freq: Once | ORAL | Status: AC
  Administered 2020-06-21: 14:00:00 1 via ORAL

## 2020-06-21 MED ORDER — DIPHENOXYLATE-ATROPINE 2.5-0.025 MG PO TABS
1.0000 | ORAL_TABLET | Freq: Once | ORAL | Status: AC
  Administered 2020-06-21: 15:00:00 1 via ORAL

## 2020-06-21 MED ORDER — ONDANSETRON HCL 4 MG/2ML IJ SOLN
INTRAMUSCULAR | Status: AC
  Filled 2020-06-21: qty 4

## 2020-06-21 MED ORDER — ONDANSETRON HCL 4 MG/2ML IJ SOLN
8.0000 mg | Freq: Once | INTRAMUSCULAR | Status: AC
  Administered 2020-06-21: 14:00:00 8 mg via INTRAVENOUS

## 2020-06-21 MED FILL — DIPHENOXYLATE-ATROPINE 2.5-: 2.5-0.025 | 3 days supply | Qty: 30 | Fill #0

## 2020-06-21 MED FILL — LORazepam 0.5 MG TABS: 0.5 | 8 days supply | Qty: 30 | Fill #0

## 2020-06-21 NOTE — Telephone Encounter (Signed)
This RN returned VM left by the patient's daughter - Leavy Cella - stating pt has been vomiting all night ( about every hour ) and having diarrhea- pt is stating she has blood in stool.  Jasmine states her mom has history of blood in stools.  Pt is unable to take medications due to vomiting.  Per communication with Symptom Management- appointment obtained.  Jasmine made aware and stated understanding.  This RN sent STAT los per above.  Lab orders entered for today.

## 2020-06-21 NOTE — Patient Instructions (Signed)

## 2020-06-21 NOTE — Progress Notes (Signed)
Port was not accessed, IV was started by SM.

## 2020-06-21 NOTE — Progress Notes (Signed)
[facility] Progress Note   Sheryl Mcmillan [id number] 07-03-1975 44 y.o.  Sheryl Mcmillan is managed by Dr. Lurline Del  Actively treated with chemotherapy/immunotherapy/hormonal therapy: yes  Current therapy: TCHP with Udenyca support  Last treated: 06/16/2020 (cycle #1)  Next scheduled appointment with provider: 06/23/2020  Assessment: Plan:    Non-intractable vomiting with nausea, unspecified vomiting type - Plan: ondansetron (ZOFRAN) injection 8 mg, 0.9 %  sodium chloride infusion, LORazepam (ATIVAN) 0.5 MG tablet  Diarrhea, unspecified type - Plan: diphenoxylate-atropine (LOMOTIL) 2.5-0.025 MG per tablet 1 tablet, 0.9 %  sodium chloride infusion, diphenoxylate-atropine (LOMOTIL) 2.5-0.025 MG tablet, diphenoxylate-atropine (LOMOTIL) 2.5-0.025 MG per tablet 1 tablet  Malignant neoplasm of upper-outer quadrant of left breast in female, estrogen receptor positive (HCC)   Nausea and vomiting: Sheryl Mcmillan was given Zofran 8 mg IV x 1 today. Additionally, she was given a prescription for Ativan 0.5 mg SL Q 6 hours prn nausea.  Diarrhea: Sheryl Mcmillan was given Lomotil 1 tablet PO x 2 and was given a prescription for the same.  ER positive malignant neoplasm of the left breast: Sheryl Mcmillan is status post cycle #1 of TCHP with Udenyca support which was dosed on 06/16/2020. She is scheduled to return for follow up on 06/23/2020.   Please see After Visit Summary for patient specific instructions.  Future Appointments  Date Time Provider [place]  06/23/2020  9:15 AM [facility] None  06/23/2020  9:30 AM [facility] [place] FLUSH [facility] None  06/23/2020 10:00 AM [place] Phlegm, NP [facility] None  07/07/2020  7:45 AM [facility] None  07/07/2020  8:00 AM [facility] [place] FLUSH [facility] None  07/07/2020  8:30 AM Causey, Charlestine Massed, NP [facility] None  07/07/2020  9:00 AM [facility] INFUSION [facility] None   07/09/2020  9:30 AM [facility] [place] FLUSH [facility] None  07/18/2020  8:30 AM [facility] SCREENING [facility] None  07/28/2020  8:30 AM [facility] LAB [facility] None  07/28/2020  8:45 AM [facility] [place] FLUSH [facility] None  07/28/2020  9:00 AM Causey, Charlestine Massed, NP [facility] None  07/28/2020  9:45 AM [facility] INFUSION [facility] None  07/30/2020  9:30 AM [facility] [place] FLUSH [facility] None  08/18/2020  8:30 AM [facility] LAB [facility] None  08/18/2020  8:45 AM [facility] [place] FLUSH [facility] None  08/18/2020  9:00 AM Magrinat, [name], MD [facility] None  08/18/2020  9:45 AM [facility] INFUSION [facility] None  08/20/2020  9:30 AM [facility] [place] FLUSH [facility] None    No orders of the defined types were placed in this encounter.      Subjective:   Patient ID:  Sheryl Mcmillan is a 44 y.o. (DOB 02/20/1976) female.  Chief Complaint: No chief complaint on file.   HPI Sheryl Mcmillan  is a 44 y.o. female with a diagnosis of an ER positive malignant neoplasm of the left breast. She is managed by Dr. Jana Hakim and is status post cycle #1 of TCHP with Udenyca support which was dosed on 06/16/2020.  Sheryl Mcmillan presents to the clinic today with her daughter.  She reports that she has had significant nausea, vomiting, and diarrhea since her chemotherapy treatment.  She denies fevers, chills, sweats, headache, pain, or constipation.   Medications: I have reviewed the patient's current medications.  Allergies:  Allergies  Allergen Reactions  . Crest Tc-Baking [place] [Sodium Fluoride] Hives    Baking Soda    Past Medical History:  Diagnosis Date  . Arthritis   . Breast cancer ([employer])   .  Family history of non-Hodgkin's lymphoma 06/01/2020  . Hemorrhoids   . Migraines   . Obesity   . PCOS (polycystic ovarian syndrome)     Past Surgical History:  Procedure Laterality Date  . COSMETIC SURGERY    . DILATION AND CURETTAGE OF UTERUS    . HEMORRHOID SURGERY    . IR IMAGING GUIDED PORT  INSERTION  06/15/2020    Family History  Problem Relation Age of Onset  . Hypertension Mother   . Diabetes Mother   . Cancer Mother 76       unknown primary  . Other Mother        brain tumor; dx 23s  . Non-Hodgkin's lymphoma Brother 84  . Other Maternal Uncle 52       brain tumor  . Breast cancer Other        MGM's niece; dx late 42s    Social History   Socioeconomic History  . Marital status: Single    Spouse name: Not on file  . Number of children: 1  . Years of education: Not on file  . Highest education level: Some college, no degree  Occupational History  . Not on file  Tobacco Use  . Smoking status: Current Every Day Smoker    Packs/day: 0.50    Years: 10.00    Pack years: 5.00    Types: Cigarettes  . Smokeless tobacco: Never Used  Vaping Use  . Vaping Use: Never used  Substance and Sexual Activity  . Alcohol use: Yes    Comment: occasionally  . Drug use: Yes    Types: Marijuana  . Sexual activity: Yes    Birth control/protection: None  Other Topics Concern  . Not on file  Social History Narrative  . Not on file   Social Determinants of Health   Financial Resource Strain: Not on file  Food Insecurity: Not on file  Transportation Needs: No Transportation Needs  . Lack of Transportation (Medical): No  . Lack of Transportation (Non-Medical): No  Physical Activity: Not on file  Stress: Not on file  Social Connections: Not on file  Intimate Partner Violence: Not on file    Past Medical History, Surgical history, Social history, and Family history were reviewed and updated as appropriate.   Please see review of systems for further details on the patient's review from today.   Review of Systems:  Review of Systems  Constitutional: Negative for appetite change, chills, diaphoresis and fever.  HENT: Negative for trouble swallowing.   Respiratory: Negative for cough, chest tightness and shortness of breath.   Cardiovascular: Negative for chest pain,  palpitations and leg swelling.  Gastrointestinal: Positive for diarrhea, nausea and vomiting. Negative for abdominal distention, abdominal pain, blood in stool and constipation.  Genitourinary: Negative for decreased urine volume and difficulty urinating.  Neurological: Negative for weakness.    Objective:   Physical Exam:  BP 138/84   Pulse 75   Temp 98.1 F (36.7 C) (Oral)   Resp 18   LMP 06/11/2020   SpO2 99%  ECOG: 1  Physical Exam Constitutional:      General: She is not in acute distress.    Appearance: She is not ill-appearing, toxic-appearing or diaphoretic.  HENT:     Head: Normocephalic and atraumatic.  Eyes:     General: No scleral icterus.       Right eye: No discharge.        Left eye: No discharge.     Conjunctiva/sclera: Conjunctivae  normal.  Neurological:     Coordination: Coordination normal.     Gait: Gait normal.  Psychiatric:        Mood and Affect: Mood normal.        Behavior: Behavior normal.        Thought Content: Thought content normal.        Judgment: Judgment normal.     Lab Review:     Component Value Date/Time   NA 135 06/21/2020 1335   K 4.1 06/21/2020 1335   CL 105 06/21/2020 1335   CO2 23 06/21/2020 1335   GLUCOSE 141 (H) 06/21/2020 1335   BUN 13 06/21/2020 1335   CREATININE 0.73 06/21/2020 1335   CALCIUM 8.5 (L) 06/21/2020 1335   PROT 7.1 06/21/2020 1335   ALBUMIN 3.7 06/21/2020 1335   AST 18 06/21/2020 1335   ALT 33 06/21/2020 1335   ALKPHOS 74 06/21/2020 1335   BILITOT 0.5 06/21/2020 1335   GFRNONAA >60 06/21/2020 1335   GFRAA >60 06/06/2019 1205       Component Value Date/Time   WBC 15.3 (H) 06/21/2020 1335   WBC 9.3 06/06/2019 1205   RBC 5.27 (H) 06/21/2020 1335   HGB 14.6 06/21/2020 1335   HCT 43.8 06/21/2020 1335   PLT 364 06/21/2020 1335   MCV 83.1 06/21/2020 1335   MCH 27.7 06/21/2020 1335   MCHC 33.3 06/21/2020 1335   RDW 13.4 06/21/2020 1335   LYMPHSABS 1.7 06/21/2020 1335   MONOABS 0.1 06/21/2020  1335   EOSABS 0.1 06/21/2020 1335   BASOSABS 0.0 06/21/2020 1335   -------------------------------  Imaging from last 24 hours (if applicable):  Radiology interpretation: ECHOCARDIOGRAM COMPLETE  Result Date: 06/09/2020    ECHOCARDIOGRAM REPORT   Patient Name:   EVELENE KUBAL Date of Exam: 06/09/2020 Medical Rec #:  [id number]       Height:       71.0 in Accession #:    [id number]      Weight:       308.0 lb Date of Birth:  04-27-1976        BSA:          2.533 m Patient Age:    89 years        BP:           118/79 mmHg Patient Gender: F               HR:           83 bpm. Exam Location:  Outpatient Procedure: 2D Echo, 3D Echo, Cardiac Doppler, Color Doppler and Strain Analysis Indications:    Z51.11 Encounter for antineoplastic chemotheraphy  History:        Patient has no prior history of Echocardiogram examinations. No                 prior cardiac history. Breast cacner.  Sonographer:    Roseanna Rainbow Referring Phys: [phone] GUSTAV C MAGRINAT  Sonographer Comments: Technically difficult study due to poor echo windows, patient is morbidly obese, suboptimal parasternal window and no subcostal window. Image acquisition challenging due to patient body habitus. Global longitudinal strain was attempted. IMPRESSIONS  1. Left ventricular ejection fraction, by estimation, is 65 to 70%. The left ventricle has normal function. The left ventricle has no regional wall motion abnormalities. Left ventricular diastolic parameters are consistent with Grade I diastolic dysfunction (impaired relaxation).  2. Right ventricular systolic function is normal. The right ventricular size is normal.  3. The mitral  valve is normal in structure. Trivial mitral valve regurgitation.  4. The aortic valve is normal in structure. Aortic valve regurgitation is not visualized. FINDINGS  Left Ventricle: Left ventricular ejection fraction, by estimation, is 65 to 70%. The left ventricle has normal function. The left ventricle has no regional  wall motion abnormalities. The left ventricular internal cavity size was normal in size. There is  no left ventricular hypertrophy. Left ventricular diastolic parameters are consistent with Grade I diastolic dysfunction (impaired relaxation). Right Ventricle: The right ventricular size is normal. No increase in right ventricular wall thickness. Right ventricular systolic function is normal. Left Atrium: Left atrial size was normal in size. Right Atrium: Right atrial size was normal in size. Pericardium: There is no evidence of pericardial effusion. Mitral Valve: The mitral valve is normal in structure. Trivial mitral valve regurgitation. Tricuspid Valve: The tricuspid valve is grossly normal. Tricuspid valve regurgitation is not demonstrated. Aortic Valve: The aortic valve is normal in structure. Aortic valve regurgitation is not visualized. Pulmonic Valve: The pulmonic valve was normal in structure. Pulmonic valve regurgitation is not visualized. Aorta: The aortic root and ascending aorta are structurally normal, with no evidence of dilitation. IAS/Shunts: The atrial septum is grossly normal.  LEFT VENTRICLE PLAX 2D LVIDd:         5.20 cm     Diastology LVIDs:         3.00 cm     LV e' medial:    6.42 cm/s LV PW:         1.10 cm     LV E/e' medial:  11.3 LV IVS:        1.20 cm     LV e' lateral:   8.59 cm/s LVOT diam:     2.20 cm     LV E/e' lateral: 8.4 LV SV:         110 LV SV Index:   44          2D Longitudinal Strain LVOT Area:     3.80 cm    2D Strain GLS Avg:     -27.2 %  LV Volumes (MOD) LV vol d, MOD A2C: 62.6 ml LV vol d, MOD A4C: 78.1 ml LV vol s, MOD A2C: 18.8 ml LV vol s, MOD A4C: 21.8 ml LV SV MOD A2C:     43.8 ml LV SV MOD A4C:     78.1 ml LV SV MOD BP:      51.2 ml RIGHT VENTRICLE RV S prime:     13.70 cm/s TAPSE (M-mode): 2.2 cm LEFT ATRIUM             Index       RIGHT ATRIUM           Index LA diam:        3.40 cm 1.34 cm/m  RA Area:     17.80 cm LA Vol (A2C):   61.2 ml 24.16 ml/m RA Volume:    40.80 ml  16.11 ml/m LA Vol (A4C):   56.2 ml 22.18 ml/m LA Biplane Vol: 62.6 ml 24.71 ml/m  AORTIC VALVE LVOT Vmax:   149.00 cm/s LVOT Vmean:  111.000 cm/s LVOT VTI:    0.290 m  AORTA Ao Root diam: 3.40 cm MITRAL VALVE MV Area (PHT): 3.42 cm     SHUNTS MV Decel Time: 222 msec     Systemic VTI:  0.29 m MV E velocity: 72.30 cm/s   Systemic Diam: 2.20 cm MV A velocity:  103.00 cm/s MV E/A ratio:  0.70 Kristeen Miss MD Electronically signed by Kristeen Miss MD Signature Date/Time: 06/09/2020/4:05:48 PM    Final    IR IMAGING GUIDED PORT INSERTION  Result Date: 06/15/2020 CLINICAL DATA:  Left breast carcinoma, needs durable venous access for planned treatment regimen EXAM: TUNNELED PORT CATHETER PLACEMENT WITH ULTRASOUND AND FLUOROSCOPIC GUIDANCE FLUOROSCOPY TIME:  3 mGy; 6 seconds ANESTHESIA/SEDATION: Intravenous Fentanyl and Versed 4mg  were administered as conscious sedation during continuous monitoring of the patient's level of consciousness and physiological / cardiorespiratory status by the radiology RN, with a total moderate sedation time of 15 minutes. TECHNIQUE: The procedure, risks, benefits, and alternatives were explained to the patient. Questions regarding the procedure were encouraged and answered. The patient understands and consents to the procedure. As antibiotic prophylaxis, cefazolin 2 g was ordered pre-procedure and administered intravenously within one hour of incision. Patency of the right IJ vein was confirmed with ultrasound with image documentation. An appropriate skin site was determined. Skin site was marked. Region was prepped using maximum barrier technique including cap and mask, sterile gown, sterile gloves, large sterile sheet, and Chlorhexidine as cutaneous antisepsis. The region was infiltrated locally with 1% lidocaine. Under real-time ultrasound guidance, the right IJ vein was accessed with a 21 gauge micropuncture needle; the needle tip within the vein was confirmed  with ultrasound image documentation. Needle was exchanged over a 018 guidewire for transitional dilator, and vascular measurement was performed. A small incision was made on the right anterior chest wall and a subcutaneous pocket fashioned. The power-injectable port was positioned and its catheter tunneled to the right IJ dermatotomy site. The transitional dilator was exchanged over an Amplatz wire for a peel-away sheath, through which the port catheter, which had been trimmed to the appropriate length, was advanced and positioned under fluoroscopy with its tip at the cavoatrial junction. Spot chest radiograph confirms good catheter position and no pneumothorax. The port was flushed per protocol. The pocket was closed with deep interrupted and subcuticular continuous 3-0 Monocryl sutures. The incisions were covered with Dermabond then covered with a sterile dressing. The patient tolerated the procedure well. COMPLICATIONS: COMPLICATIONS None immediate IMPRESSION: Technically successful right IJ power-injectable port catheter placement. Ready for routine use. Electronically Signed   By: M.D.   On: 06/15/2020 17:03

## 2020-06-21 NOTE — Patient Instructions (Signed)

## 2020-06-22 ENCOUNTER — Other Ambulatory Visit: Payer: Self-pay | Admitting: *Deleted

## 2020-06-22 DIAGNOSIS — C50412 Malignant neoplasm of upper-outer quadrant of left female breast: Secondary | ICD-10-CM

## 2020-06-22 NOTE — Telephone Encounter (Signed)
Contacted patient in attempt to disclose results of genetic testing.  LVM with contact information requesting a call back.  Second attempt.  

## 2020-06-22 NOTE — Progress Notes (Signed)
These results were reviewed with the patient.

## 2020-06-23 ENCOUNTER — Inpatient Hospital Stay: Payer: Medicaid Other

## 2020-06-23 ENCOUNTER — Other Ambulatory Visit: Payer: Self-pay

## 2020-06-23 ENCOUNTER — Encounter: Payer: Self-pay | Admitting: Adult Health

## 2020-06-23 ENCOUNTER — Other Ambulatory Visit: Payer: Self-pay | Admitting: *Deleted

## 2020-06-23 ENCOUNTER — Inpatient Hospital Stay (HOSPITAL_BASED_OUTPATIENT_CLINIC_OR_DEPARTMENT_OTHER): Payer: Medicaid Other | Admitting: Adult Health

## 2020-06-23 ENCOUNTER — Encounter: Payer: Self-pay | Admitting: *Deleted

## 2020-06-23 VITALS — BP 132/87 | HR 108 | Temp 98.5°F | Resp 18 | Ht 71.0 in | Wt 289.1 lb

## 2020-06-23 DIAGNOSIS — C50412 Malignant neoplasm of upper-outer quadrant of left female breast: Secondary | ICD-10-CM

## 2020-06-23 DIAGNOSIS — Z809 Family history of malignant neoplasm, unspecified: Secondary | ICD-10-CM

## 2020-06-23 DIAGNOSIS — E860 Dehydration: Secondary | ICD-10-CM | POA: Diagnosis not present

## 2020-06-23 DIAGNOSIS — Z79899 Other long term (current) drug therapy: Secondary | ICD-10-CM

## 2020-06-23 DIAGNOSIS — Z170 Estrogen receptor positive status [ER+]: Secondary | ICD-10-CM

## 2020-06-23 DIAGNOSIS — B379 Candidiasis, unspecified: Secondary | ICD-10-CM

## 2020-06-23 DIAGNOSIS — F17210 Nicotine dependence, cigarettes, uncomplicated: Secondary | ICD-10-CM

## 2020-06-23 DIAGNOSIS — R197 Diarrhea, unspecified: Secondary | ICD-10-CM

## 2020-06-23 DIAGNOSIS — R110 Nausea: Secondary | ICD-10-CM | POA: Diagnosis not present

## 2020-06-23 DIAGNOSIS — Z5111 Encounter for antineoplastic chemotherapy: Secondary | ICD-10-CM | POA: Diagnosis not present

## 2020-06-23 DIAGNOSIS — Z807 Family history of other malignant neoplasms of lymphoid, hematopoietic and related tissues: Secondary | ICD-10-CM

## 2020-06-23 LAB — CBC WITH DIFFERENTIAL (CANCER CENTER ONLY)
Abs Immature Granulocytes: 0.12 10*3/uL — ABNORMAL HIGH (ref 0.00–0.07)
Basophils Absolute: 0.1 10*3/uL (ref 0.0–0.1)
Basophils Relative: 1 %
Eosinophils Absolute: 0.1 10*3/uL (ref 0.0–0.5)
Eosinophils Relative: 2 %
HCT: 43.6 % (ref 36.0–46.0)
Hemoglobin: 14.1 g/dL (ref 12.0–15.0)
Immature Granulocytes: 2 %
Lymphocytes Relative: 60 %
Lymphs Abs: 3.2 10*3/uL (ref 0.7–4.0)
MCH: 27.6 pg (ref 26.0–34.0)
MCHC: 32.3 g/dL (ref 30.0–36.0)
MCV: 85.5 fL (ref 80.0–100.0)
Monocytes Absolute: 0.5 10*3/uL (ref 0.1–1.0)
Monocytes Relative: 10 %
Neutro Abs: 1.4 10*3/uL — ABNORMAL LOW (ref 1.7–7.7)
Neutrophils Relative %: 25 %
Platelet Count: 351 10*3/uL (ref 150–400)
RBC: 5.1 MIL/uL (ref 3.87–5.11)
RDW: 13.3 % (ref 11.5–15.5)
WBC Count: 5.4 10*3/uL (ref 4.0–10.5)
nRBC: 0 % (ref 0.0–0.2)

## 2020-06-23 MED ORDER — SODIUM CHLORIDE 0.9 % IV SOLN
INTRAVENOUS | Status: AC
  Filled 2020-06-23: qty 250

## 2020-06-23 MED ORDER — FLUCONAZOLE 200 MG PO TABS
200.0000 mg | ORAL_TABLET | Freq: Every day | ORAL | 0 refills | Status: DC
Start: 2020-06-23 — End: 2020-06-23

## 2020-06-23 MED ORDER — SODIUM CHLORIDE 0.9% FLUSH
10.0000 mL | INTRAVENOUS | Status: DC | PRN
  Filled 2020-06-23: qty 10

## 2020-06-23 MED FILL — FLUCONAZOLE 200 MG TAB: 200 | 7 days supply | Qty: 7 | Fill #0

## 2020-06-23 NOTE — Progress Notes (Signed)
[place]  Telephone:([phone]) [phone] Fax:(336) [phone]     ID: Sheryl Mcmillan DOB: [birth year]  MR#: [id number]  BRA#:[id number]  Patient Care Team: Patient, No Pcp Per as PCP - General (General Practice) [name], RN as Oncology Nurse Navigator [name], RN as Oncology Nurse Navigator [name], MD as Consulting Physician (General Surgery) Magrinat, [name], MD as Consulting Physician (Oncology) [name], MD as Consulting Physician (Radiation Oncology) Scot Dock, NP OTHER MD:  CHIEF COMPLAINT: estrogen receptor negative, Her2 positive breast cancer  CURRENT TREATMENT: Neoadjuvant chemotherapy   INTERVAL HISTORY: Sheryl Mcmillan returns today for follow up of her estrogen receptor negative, Her2 positive breast cancer accompanied by her daughter. She was evaluated in the multidisciplinary breast cancer clinic on 06/01/2020.  Sheryl Mcmillan started neoadjuvant chemotherapy on 06/16/2020.  She is receiving Docetaxel, Carboplatin, Trastuzumab, pertuzumab every 21 days with udenyca on day 3 for growth factor support.  Today is cycle 1 day 8.    Sheryl Mcmillan was seen in symptom management for nausea and uncontrolled vomiting 2 days ago.  Today, she is doing better from vomiting standpoint but is increasingly fatigued.  She says that she is having about 4-8 loose bowel movements per day and is dehydrated and cramping Her CBC is stable today CMET is pending.  REVIEW OF SYSTEMS: Sheryl Mcmillan is doing moderately well today.  She is fatigued.  She denies any peripheral neuropathy.  She denies any headaches, vision issues, cough, or shortness of breath.  She notes that she has decreased fluid intake over the past couple of days.  She denies any dizziness, light headedness or increased lethargy/somnolence.  A detailed ROS was otherwise non contributory.     COVID 19 VACCINATION STATUS:    HISTORY OF CURRENT ILLNESS: From the original intake note:   Sheryl Mcmillan  presented with a palpable lump in the outer left breast, which she initially felt approximately 6 months prior. She waited to seek medical attention because she had no insurance.  Eventually she enrolled in the [facility] program and underwent bilateral diagnostic mammography with tomography and left breast ultrasonography at [facility] on 05/10/2020 showing: breast density category B; vaguely palpable 2.5 cm mass in left breast at 1:30; no pathologic left axillary lymphadenopathy or evidence of right breast malignancy.  Accordingly on 05/18/2020 she proceeded to biopsy of the left breast area in question. The pathology from this procedure ([id number]) showed: invasive ductal carcinoma, grade 3. Prognostic indicators significant for: estrogen receptor, 40% positive with weak staining intensity and progesterone receptor, 0% negative. Proliferation marker Ki67 at 50%. HER2 positive by immunohistochemistry (3+).  The patient's subsequent history is as detailed below.   PAST MEDICAL HISTORY: Past Medical History:  Diagnosis Date  . Arthritis   . Breast cancer ([place])   . Family history of non-Hodgkin's lymphoma 06/01/2020  . Hemorrhoids   . Migraines   . Obesity   . PCOS (polycystic ovarian syndrome)     PAST SURGICAL HISTORY: Past Surgical History:  Procedure Laterality Date  . COSMETIC SURGERY    . DILATION AND CURETTAGE OF UTERUS    . HEMORRHOID SURGERY    . IR IMAGING GUIDED PORT INSERTION  06/15/2020    FAMILY HISTORY: Family History  Problem Relation Age of Onset  . Hypertension Mother   . Diabetes Mother   . Cancer Mother 79       unknown primary  . Other Mother        brain tumor; dx 87s  .  Non-Hodgkin's lymphoma Brother 68  . Other Maternal Uncle 52       brain tumor  . Breast cancer Other        MGM's niece; dx late 58s   She has no information on her father. Her mother died at age 310 from stage IV cancer. The primary cancer was unknown, but it was not breast.  Sheryl Mcmillan has  two half brothers (through her mother), one of which was diagnosed with non-Hodgkin's lymphoma. There is no family history of breast, ovarian, or prostate cancer to her knowledge.    GYNECOLOGIC HISTORY:  Patient's last menstrual period was 06/11/2020. Menarche: 44 years old Age at first live birth: 44 years old [place] P 1 LMP 05/02/2020 Contraceptive: never used HRT n/a  Hysterectomy? no BSO? 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 She is single. Her significant other [name] is a Freight forwarder. She lives at home with [name], her daughter [name],  [name]'s daughter [name] and her 28-monthold daughter [name] Daughter [name] age 43 is a bChief Operating Officerat [employer]    ADVANCED DIRECTIVES: She intends to name her daughter JFredrich Birksas her HCPOA.  At the 06/01/2020 visit the patient was given the appropriate documents to complete and notarized at her discretion..[name]  HEALTH MAINTENANCE: Social History   Tobacco Use  . Smoking status: Current Every Day Smoker    Packs/day: 0.50    Years: 10.00    Pack years: 5.00    Types: Cigarettes  . Smokeless tobacco: Never Used  Vaping Use  . Vaping Use: Never used  Substance Use Topics  . Alcohol use: Yes    Comment: occasionally  . Drug use: Yes    Types: Marijuana     Colonoscopy: 2018  PAP: 2018  Bone density: n/a (age)   Allergies  Allergen Reactions  . [place] Tc-Baking Soda [Sodium Fluoride] Hives    Baking Soda    Current Outpatient Medications  Medication Sig Dispense Refill  . acetaminophen (TYLENOL) 325 MG tablet Take 650 mg by mouth every 6 (six) hours as needed.    .Marland KitchenDAILY MULTIPLE VITAMINS PO Take by mouth.    . dexamethasone (DECADRON) 4 MG tablet Take 2 tablets (8 mg total) by mouth 2 (two) times daily. Start the day before Taxotere. Then take daily x 3 days after chemotherapy. 30  tablet 1  . diphenoxylate-atropine (LOMOTIL) 2.5-0.025 MG tablet 1 to 2 tablets PO QID prn diarrhea 30 tablet 2  . Homeopathic Products (LIVER SUPPORT SL) Place under the tongue.    . lidocaine-prilocaine (EMLA) cream Apply to affected area once 30 g 3  . LORazepam (ATIVAN) 0.5 MG tablet Place 1 tablet (0.5 mg total) under the tongue every 6 (six) hours as needed for anxiety. 30 tablet 0  . Phenylephrine-Aspirin (ALKA-SELTZER PLUS SINUS PO) Take by mouth.    . prochlorperazine (COMPAZINE) 10 MG tablet Take 1 tablet (10 mg total) by mouth every 6 (six) hours as needed (Nausea or vomiting). 30 tablet 1   No current facility-administered medications for this visit.    OBJECTIVE: White woman who appears stated age  There were no vitals filed for this visit.   There is no height or weight on file to calculate BMI.   Wt Readings from Last 3 Encounters:  06/20/20 (!) 301 lb 9.6 oz (136.8 kg)  06/10/20 (!) 305 lb 14.4 oz (  138.8 kg)  06/01/20 (!) 308 lb (139.7 kg)      ECOG FS:1 - Symptomatic but completely ambulatory GENERAL: Patient is a tired appear woman in no acute distress HEENT:  Sclerae anicteric.  Mild thrush noted in mouth.   Neck is supple.  NODES:  No cervical, supraclavicular, or axillary lymphadenopathy palpated.  BREAST EXAM:  Deferred. LUNGS:  Clear to auscultation bilaterally.  No wheezes or rhonchi. HEART:  Regular rate and rhythm. No murmur appreciated. ABDOMEN:  Soft, nontender.  Positive, normoactive bowel sounds. No organomegaly palpated. EXTREMITIES:  No peripheral edema.   SKIN:  Clear with no obvious rashes or skin changes. No nail dyscrasia. NEURO:  Nonfocal. Well oriented.  Appropriate affect.    LAB RESULTS:  CMP     Component Value Date/Time   NA 135 06/21/2020 1335   K 4.1 06/21/2020 1335   CL 105 06/21/2020 1335   CO2 23 06/21/2020 1335   GLUCOSE 141 (H) 06/21/2020 1335   BUN 13 06/21/2020 1335   CREATININE 0.73 06/21/2020 1335   CALCIUM 8.5 (L)  06/21/2020 1335   PROT 7.1 06/21/2020 1335   ALBUMIN 3.7 06/21/2020 1335   AST 18 06/21/2020 1335   ALT 33 06/21/2020 1335   ALKPHOS 74 06/21/2020 1335   BILITOT 0.5 06/21/2020 1335   GFRNONAA >60 06/21/2020 1335   GFRAA >60 06/06/2019 1205    No results found for: TOTALPROTELP, ALBUMINELP, A1GS, A2GS, BETS, BETA2SER, GAMS, MSPIKE, SPEI  Lab Results  Component Value Date   WBC 15.3 (H) 06/21/2020   NEUTROABS 12.3 (H) 06/21/2020   HGB 14.6 06/21/2020   HCT 43.8 06/21/2020   MCV 83.1 06/21/2020   PLT 364 06/21/2020    No results found for: LABCA2  No components found for: TAVWPV948  No results for input(s): INR in the last 168 hours.  No results found for: LABCA2  No results found for: AXK553  No results found for: ZSM270  No results found for: BEM754  No results found for: CA2729  No components found for: HGQUANT  No results found for: CEA1 / No results found for: CEA1   No results found for: AFPTUMOR  No results found for: CHROMOGRNA  No results found for: KPAFRELGTCHN, LAMBDASER, KAPLAMBRATIO (kappa/lambda light chains)  No results found for: HGBA, HGBA2QUANT, HGBFQUANT, HGBSQUAN (Hemoglobinopathy evaluation)   No results found for: LDH  No results found for: IRON, TIBC, IRONPCTSAT (Iron and TIBC)  No results found for: FERRITIN  Urinalysis    Component Value Date/Time   COLORURINE YELLOW 06/06/2019 [place] 06/06/2019 1205   LABSPEC >1.030 (H) 06/06/2019 1205   PHURINE 5.5 06/06/2019 1205   GLUCOSEU NEGATIVE 06/06/2019 1205   HGBUR TRACE (A) 06/06/2019 1205   BILIRUBINUR NEGATIVE 06/06/2019 1205   KETONESUR NEGATIVE 06/06/2019 [place] 06/06/2019 1205   UROBILINOGEN 1.0 05/09/2011 1805   NITRITE NEGATIVE 06/06/2019 1205   LEUKOCYTESUR NEGATIVE 06/06/2019 1205    STUDIES: ECHOCARDIOGRAM COMPLETE  Result Date: 06/09/2020    ECHOCARDIOGRAM REPORT   Patient Name:   JORIE ZEE Date of Exam: 06/09/2020  Medical Rec #:  [id number]       Height:       71.0 in Accession #:    [id number]      Weight:       308.0 lb Date of Birth:  1976-01-16        BSA:          2.533 m Patient Age:  44 years        BP:           118/79 mmHg Patient Gender: F               HR:           83 bpm. Exam Location:  Outpatient Procedure: 2D Echo, 3D Echo, Cardiac Doppler, Color Doppler and Strain Analysis Indications:    Z51.11 Encounter for antineoplastic chemotheraphy  History:        Patient has no prior history of Echocardiogram examinations. No                 prior cardiac history. Breast cacner.  Sonographer:    Roseanna Rainbow Referring Phys: [phone] GUSTAV C MAGRINAT  Sonographer Comments: Technically difficult study due to poor echo windows, patient is morbidly obese, suboptimal parasternal window and no subcostal window. Image acquisition challenging due to patient body habitus. Global longitudinal strain was attempted. IMPRESSIONS  1. Left ventricular ejection fraction, by estimation, is 65 to 70%. The left ventricle has normal function. The left ventricle has no regional wall motion abnormalities. Left ventricular diastolic parameters are consistent with Grade I diastolic dysfunction (impaired relaxation).  2. Right ventricular systolic function is normal. The right ventricular size is normal.  3. The mitral valve is normal in structure. Trivial mitral valve regurgitation.  4. The aortic valve is normal in structure. Aortic valve regurgitation is not visualized. FINDINGS  Left Ventricle: Left ventricular ejection fraction, by estimation, is 65 to 70%. The left ventricle has normal function. The left ventricle has no regional wall motion abnormalities. The left ventricular internal cavity size was normal in size. There is  no left ventricular hypertrophy. Left ventricular diastolic parameters are consistent with Grade I diastolic dysfunction (impaired relaxation). Right Ventricle: The right ventricular size is normal. No increase in right  ventricular wall thickness. Right ventricular systolic function is normal. Left Atrium: Left atrial size was normal in size. Right Atrium: Right atrial size was normal in size. Pericardium: There is no evidence of pericardial effusion. Mitral Valve: The mitral valve is normal in structure. Trivial mitral valve regurgitation. Tricuspid Valve: The tricuspid valve is grossly normal. Tricuspid valve regurgitation is not demonstrated. Aortic Valve: The aortic valve is normal in structure. Aortic valve regurgitation is not visualized. Pulmonic Valve: The pulmonic valve was normal in structure. Pulmonic valve regurgitation is not visualized. Aorta: The aortic root and ascending aorta are structurally normal, with no evidence of dilitation. IAS/Shunts: The atrial septum is grossly normal.  LEFT VENTRICLE PLAX 2D LVIDd:         5.20 cm     Diastology LVIDs:         3.00 cm     LV e' medial:    6.42 cm/s LV PW:         1.10 cm     LV E/e' medial:  11.3 LV IVS:        1.20 cm     LV e' lateral:   8.59 cm/s LVOT diam:     2.20 cm     LV E/e' lateral: 8.4 LV SV:         110 LV SV Index:   44          2D Longitudinal Strain LVOT Area:     3.80 cm    2D Strain GLS Avg:     -27.2 %  LV Volumes (MOD) LV vol d, MOD A2C: 62.6 ml LV vol d,  MOD A4C: 78.1 ml LV vol s, MOD A2C: 18.8 ml LV vol s, MOD A4C: 21.8 ml LV SV MOD A2C:     43.8 ml LV SV MOD A4C:     78.1 ml LV SV MOD BP:      51.2 ml RIGHT VENTRICLE RV S prime:     13.70 cm/s TAPSE (M-mode): 2.2 cm LEFT ATRIUM             Index       RIGHT ATRIUM           Index LA diam:        3.40 cm 1.34 cm/m  RA Area:     17.80 cm LA Vol (A2C):   61.2 ml 24.16 ml/m RA Volume:   40.80 ml  16.11 ml/m LA Vol (A4C):   56.2 ml 22.18 ml/m LA Biplane Vol: 62.6 ml 24.71 ml/m  AORTIC VALVE LVOT Vmax:   149.00 cm/s LVOT Vmean:  111.000 cm/s LVOT VTI:    0.290 m  AORTA Ao Root diam: 3.40 cm MITRAL VALVE MV Area (PHT): 3.42 cm     SHUNTS MV Decel Time: 222 msec     Systemic VTI:  0.29 m MV E  velocity: 72.30 cm/s   Systemic Diam: 2.20 cm MV A velocity: 103.00 cm/s MV E/A ratio:  0.70 Mertie Moores MD Electronically signed by Mertie Moores MD Signature Date/Time: 06/09/2020/4:05:48 PM    Final    IR IMAGING GUIDED PORT INSERTION  Result Date: 06/15/2020 CLINICAL DATA:  Left breast carcinoma, needs durable venous access for planned treatment regimen EXAM: TUNNELED PORT CATHETER PLACEMENT WITH ULTRASOUND AND FLUOROSCOPIC GUIDANCE FLUOROSCOPY TIME:  3 mGy; 6 seconds ANESTHESIA/SEDATION: Intravenous Fentanyl 124mg and Versed 475mwere administered as conscious sedation during continuous monitoring of the patient's level of consciousness and physiological / cardiorespiratory status by the radiology RN, with a total moderate sedation time of 15 minutes. TECHNIQUE: The procedure, risks, benefits, and alternatives were explained to the patient. Questions regarding the procedure were encouraged and answered. The patient understands and consents to the procedure. As antibiotic prophylaxis, cefazolin 2 g was ordered pre-procedure and administered intravenously within one hour of incision. Patency of the right IJ vein was confirmed with ultrasound with image documentation. An appropriate skin site was determined. Skin site was marked. Region was prepped using maximum barrier technique including cap and mask, sterile gown, sterile gloves, large sterile sheet, and Chlorhexidine as cutaneous antisepsis. The region was infiltrated locally with 1% lidocaine. Under real-time ultrasound guidance, the right IJ vein was accessed with a 21 gauge micropuncture needle; the needle tip within the vein was confirmed with ultrasound image documentation. Needle was exchanged over a 018 guidewire for transitional dilator, and vascular measurement was performed. A small incision was made on the right anterior chest wall and a subcutaneous pocket fashioned. The power-injectable port was positioned and its catheter tunneled to the  right IJ dermatotomy site. The transitional dilator was exchanged over an Amplatz wire for a peel-away sheath, through which the port catheter, which had been trimmed to the appropriate length, was advanced and positioned under fluoroscopy with its tip at the cavoatrial junction. Spot chest radiograph confirms good catheter position and no pneumothorax. The port was flushed per protocol. The pocket was closed with deep interrupted and subcuticular continuous 3-0 Monocryl sutures. The incisions were covered with Dermabond then covered with a sterile dressing. The patient tolerated the procedure well. COMPLICATIONS: COMPLICATIONS None immediate IMPRESSION: Technically successful right IJ power-injectable port  catheter placement. Ready for routine use. Electronically Signed   By: Lucrezia Europe M.D.   On: 06/15/2020 17:03     ELIGIBLE FOR AVAILABLE RESEARCH PROTOCOL: AET  ASSESSMENT: 44 y.o. [place] woman status post left breast upper outer quadrant biopsy 05/18/2020 for a clinical T2N0, stage IIA invasive ductal carcinoma, grade 3, estrogen receptor weakly positive, progesterone receptor negative, with an MIB-1 of 50%, and HER-2 positive by immunohistochemistry  (1) genetics testing pending  (2) neoadjuvant chemotherapy to consist of carboplatin and docetaxel together with trastuzumab and Pertuzumab every 3 weeks x 6, starting 06/16/2020  (3) trastuzumab to be continued to complete 1 year  (a) echo 06/09/2020 shows EF in the 60-65% range  (4) definitive surgery to follow  (5) adjuvant radiation  (6) antiestrogens   PLAN:  Vaness had a difficult time with chemotherapy.  Her labs are stable, however she is having difficulty with dehydration due to nausea, vomiting, and diarrhea.  She is taking imodium and lomotil to help decrease the diarrhea. I let her know that the diarrhea is secondary to the Pertuzumab.  I have discontinued this from cycle 2 and 3 of her treatment regimen to allow her bowels to  recover and we will add it back with cycle 4 of therapy.    For her dehydration she will receive IV fluids tomorrow.  She declined receiving them today.    Melodee has mild thrush and I sent in Fluconazole for her to take daily x 5 days.  On day 1 of her treatment   Giah denies any peripheral neuropathy or skin changes, and we are monitoring her very closely for this.    We will see Mariangel back in 2 weeks for labs, f/u and cycle two of her chemotherapy.  She knows to call for any questions that may arise between now and her next appointment.  We are happy to see her sooner if needed.   Total encounter time 30 minutes*  Wilber Bihari, NP 06/26/20 7:25 PM Medical Oncology and Hematology [facility] [place], [place] [address] Tel. [phone]    Fax. [phone]   *Total Encounter Time as defined by the Centers for Medicare and Medicaid Services includes, in addition to the face-to-face time of a patient visit (documented in the note above) non-face-to-face time: obtaining and reviewing outside history, ordering and reviewing medications, tests or procedures, care coordination (communications with other health care professionals or caregivers) and documentation in the medical record.

## 2020-06-24 ENCOUNTER — Other Ambulatory Visit: Payer: Self-pay

## 2020-06-24 ENCOUNTER — Inpatient Hospital Stay: Payer: Medicaid Other

## 2020-06-24 VITALS — BP 126/61 | HR 106 | Temp 97.3°F | Resp 20

## 2020-06-24 DIAGNOSIS — Z5111 Encounter for antineoplastic chemotherapy: Secondary | ICD-10-CM | POA: Diagnosis not present

## 2020-06-24 DIAGNOSIS — C50412 Malignant neoplasm of upper-outer quadrant of left female breast: Secondary | ICD-10-CM

## 2020-06-24 DIAGNOSIS — Z170 Estrogen receptor positive status [ER+]: Secondary | ICD-10-CM

## 2020-06-24 MED ORDER — SODIUM CHLORIDE 0.9 % IV SOLN
INTRAVENOUS | Status: DC
  Filled 2020-06-24 (×2): qty 250

## 2020-06-24 NOTE — Patient Instructions (Signed)

## 2020-06-27 ENCOUNTER — Telehealth: Payer: Self-pay | Admitting: Adult Health

## 2020-06-27 NOTE — Telephone Encounter (Signed)
No 12/30 los, no changes made to pt schedule  

## 2020-06-28 ENCOUNTER — Encounter: Payer: Self-pay | Admitting: Genetic Counselor

## 2020-06-28 DIAGNOSIS — Z1379 Encounter for other screening for genetic and chromosomal anomalies: Secondary | ICD-10-CM | POA: Insufficient documentation

## 2020-06-28 NOTE — Telephone Encounter (Signed)
Contacted patient in attempt to disclose results of genetic testing.  LVM with contact information requesting a call back.  Third attempt.  Will send letter requesting a call.

## 2020-06-29 ENCOUNTER — Telehealth: Payer: Self-pay | Admitting: Genetic Counselor

## 2020-06-29 NOTE — Telephone Encounter (Signed)
Called Sheryl Mcmillan to disclose BRCA1 positive result on genetic testing.  Briefly discussed cancer risks, management, and implications for family members.  Scheduled virtual follow up appointment for Friday 07/01/2020 to discuss results in greater detail.

## 2020-06-30 ENCOUNTER — Telehealth: Payer: Self-pay | Admitting: Licensed Clinical Social Worker

## 2020-06-30 ENCOUNTER — Other Ambulatory Visit: Payer: Self-pay | Admitting: Oncology

## 2020-06-30 NOTE — Telephone Encounter (Signed)
Patient called with questions about application for [employer]. CSW explained what is needed for application. No further questions at this time.    Merlyn Albert, LCSW

## 2020-07-01 ENCOUNTER — Ambulatory Visit (HOSPITAL_BASED_OUTPATIENT_CLINIC_OR_DEPARTMENT_OTHER): Payer: Medicaid Other | Admitting: Genetic Counselor

## 2020-07-01 DIAGNOSIS — Z807 Family history of other malignant neoplasms of lymphoid, hematopoietic and related tissues: Secondary | ICD-10-CM

## 2020-07-01 DIAGNOSIS — C50412 Malignant neoplasm of upper-outer quadrant of left female breast: Secondary | ICD-10-CM | POA: Diagnosis not present

## 2020-07-01 DIAGNOSIS — Z1379 Encounter for other screening for genetic and chromosomal anomalies: Secondary | ICD-10-CM | POA: Diagnosis not present

## 2020-07-01 DIAGNOSIS — Z1501 Genetic susceptibility to malignant neoplasm of breast: Secondary | ICD-10-CM

## 2020-07-01 DIAGNOSIS — Z1509 Genetic susceptibility to other malignant neoplasm: Secondary | ICD-10-CM

## 2020-07-01 DIAGNOSIS — Z170 Estrogen receptor positive status [ER+]: Secondary | ICD-10-CM

## 2020-07-04 ENCOUNTER — Telehealth: Payer: Self-pay | Admitting: *Deleted

## 2020-07-04 DIAGNOSIS — Z1501 Genetic susceptibility to malignant neoplasm of breast: Secondary | ICD-10-CM | POA: Insufficient documentation

## 2020-07-04 DIAGNOSIS — Z1589 Genetic susceptibility to other disease: Secondary | ICD-10-CM | POA: Insufficient documentation

## 2020-07-04 NOTE — Telephone Encounter (Signed)
Pt called requesting treatments in [place] d/t the proximity to her home and that it would be "easier" for her.  Pt does not want to transfer oncologists and wants to remain seen by Dr. Jana Hakim and Mendel Ryder. Discussed with Mendel Ryder and request sent to scheduling.

## 2020-07-04 NOTE — Progress Notes (Addendum)
GENETIC TEST RESULTS  Patient Name: Sheryl Mcmillan Patient Age: 45 y.o. Encounter Date: 07/01/2020  Referring Provider: [name]., MD  I connected with Sheryl Mcmillan on 07/01/2020 at 11am EDT by Webex video conference and verified that I am speaking with the correct person using two identifiers.   Patient location: Home Provider location: [facility] Office  Sheryl Mcmillan was seen in the [place] clinic on June 01, 2020 due to a personal history of cancer and concern regarding a hereditary predisposition to cancer in the family. Please refer to the prior Genetics clinic note for more information regarding Sheryl Mcmillan's medical and family histories and our assessment at the time.   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 The preliminary treatment plan includes neoadjuvant chemotherapy and surgery.     FAMILY HISTORY:  We obtained a detailed, 4-generation family history.  Significant diagnoses are listed below: Family History  Problem Relation Age of Onset   Cancer Mother 43       unknown primary   Other Mother        brain tumor; dx 38s   Non-Hodgkin's lymphoma Brother 47   Other Maternal Uncle 40       brain tumor   Breast cancer Other        MGM's niece; dx late 58s    Sheryl Mcmillan has one daughter, age 38, who does not have a history of cancer. Sheryl Mcmillan has two maternal half brothers.  One maternal half brother was diagnosed with Non Hodgkins Lymphoma and passed away at age 15. Sheryl Mcmillan mother passed away at age 75 after being diagnosed with cancer of an unknown primary.  Sheryl Mcmillan mother also had a brain tumor diagnosed in her 18s.  Sheryl Mcmillan maternal half uncle has a brain tumor diagnosed at age 49.  Sheryl Mcmillan reported that her maternal grandmother may have had a cancer history, possibly of gynecologic origin, but had limited information about this possible  diagnosis.  Sheryl Mcmillan also reported that her maternal grandmother's niece had breast cancer diagnosed in her late 83s.    Sheryl Mcmillan does not have information about her father or paternal family history.    Sheryl Mcmillan is unaware of previous family history of genetic testing for hereditary cancer risks. Patient's maternal ancestors are of [place], [place], and [place] descent. There is no reported Ashkenazi Jewish ancestry. There is no known consanguinity.  GENETIC TESTING: Genetic testing reported on June 16, 2020 through the Multi-Cancer Panel offered by [facility]. 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  The Multi-Cancer Panel offered by [facility] includes sequencing and/or deletion duplication testing of the following 85 genes: AIP, ALK, APC, ATM, AXIN2,BAP1,  BARD1, BLM, BMPR1A, BRCA1, BRCA2, BRIP1, CASR, CDC73, CDH1, CDK4, CDKN1B, CDKN1C, CDKN2A (p14ARF), CDKN2A (p16INK4a), CEBPA, CHEK2, CTNNA1, DICER1, DIS3L2, EGFR (c.2369C>T, p.Thr790Met variant only), EPCAM (Deletion/duplication testing only), FH, FLCN, GATA2, GPC3, GREM1 (Promoter region deletion/duplication testing only), HOXB13 (c.251G>A, p.Gly84Glu), HRAS, KIT, MAX, MEN1, MET, MITF (c.952G>A, p.Glu318Lys variant only), MLH1, MSH2, MSH3, MSH6, MUTYH, NBN, NF1, NF2, NTHL1, PALB2, PDGFRA, PHOX2B, PMS2, POLD1, POLE, POT1, PRKAR1A, PTCH1, PTEN, RAD50, RAD51C, RAD51D, RB1, RECQL4, RET, RNF43, RUNX1, SDHAF2, SDHA (sequence changes only), SDHB, SDHC, SDHD, SMAD4, SMARCA4, SMARCB1, SMARCE1, STK11, SUFU, TERC, TERT, TMEM127, TP53, TSC1, TSC2, VHL, WRN and WT1.    The test report will be scanned into EPIC and  located under the Molecular Pathology section of the Results Review tab.  A portion of the result report is included below for reference.     Genetic testing did identify a variant of uncertain significance (VUS) in the MSH3 gene called c.2724A>G (Silent).  At this time, it  is unknown if this variant is associated with increased cancer risk or if this is a normal finding, but most variants such as this get reclassified to being inconsequential. It should not be used to make medical management decisions. With time, we suspect the lab will determine the significance of this variant, if any. If we do learn more about it, we will try to contact Sheryl Mcmillan to discuss it further. However, it is important to stay in touch with [place] periodically and keep the address and phone number up to date.  The variant of uncertain significance (VUS) in MSH3 at c.2724A>G (Silent) has been reclassified to likely benign.  The change in variant classification was made as a result of re-review of evidence in light of new variant interpretation guidelines and/or new information. The amended report date is January 10, 2021.     CANCER RISKS: Studies show that females with a BRCA1 mutation can have a 40-87% lifetime risk to develop breast cancer, a 40-60% lifetime risk to develop a secondary breast cancer, and up to a 39-58% risk to develop ovarian cancer. Males can have a 1-2% lifetime risk to develop female breast cancer and an increased risk for prostate cancer. Both males and females can also have a ~5% increased risk for pancreatic cancer.  CANCER RISK REDUCTION & SCREENING RECOMMENDATIONS: The [place] (NCCN) recommends the following for females who carry BRCA mutations: Breast awareness starting at the age of 62 years; females should report any changes to their breasts to their health care provider. Periodic breast self-exams may facilitate breast self-awareness. Clinical breast exams every 6-12 months, starting at age 3 years Annual breast MRI and annual mammograms starting at age 26 years and 30 years, respectively, or individualized based on age of earliest onset of breast cancer in the family. Consideration of risk reducing mastectomy, which reduces the risk of  breast cancer by greater than 90%. Consideration of risk reducing salpingo-oophorectomy (RRSO), ideally between the ages of 99-40, or individualized based on completion of child-bearing years or age of earliest onset of ovarian cancer in the family. RRSO reduces the risk of ovarian cancer by greater than 90% and can reduce the risk of breast cancer by 50% if performed prior to menopause. Females who have undergone risk reducing RRSO have a small risk of peritoneal carcinoma and can consider annual CA-125 surveillance. For females who still have their ovaries, transvaginal ultrasound and CA-125 testing every 6 months starting at age 49 years, or 5-10 years prior to the earliest age of onset of ovarian cancer in the family can be considered. Studies have not demonstrated that ovarian cancer screening is effective in detecting early ovarian cancer. Consideration of chemoprevention options such as oral contraceptives, Tamoxifen and Raloxifene, if appropriate for an individual. Consideration of pancreatic cancer screening if there is a family history of pancreatic cancer   As discussed with Sheryl Mcmillan, to reduce the risk for breast cancer, prophylactic bilateral mastectomy is the most effective option for risk reduction. However, for females who choose to keep their breasts intensified screening is also available. At this time, we recommend she continue to follow healthcare management guidelines that have been provided to her by her overseeing  providers, including Dr. Donne Hazel and Dr. Jana Hakim.   To reduce the risk for ovarian cancer, we recommend Sheryl Mcmillan have a prophylactic bilateral salpingo-oophorectomy when childbearing is completed, if planned. We discussed that screening with CA-125 blood tests and transvaginal ultrasounds can be done twice per year. However, these tests have not been shown to detect ovarian cancer at an early stage. Sheryl Mcmillan does not have a GYN MD.  We recommend that she follow  up with a GYN MD when she is able to consider RRSO or screening.  RISK REDUCTION: There are several things that can be offered to individuals who are carriers for BRCA mutations that will reduce the risk for getting cancer.    The use of oral contraceptives can lower the risk for ovarian cancer, and, per case control studies, does not significantly increase the risk for breast cancer in BRCA patients.  Case control studies have shown that oral contraceptives can lower the risk for ovarian cancer in females with BRCA mutations. Additionally, a more recent meta-analysis, including one corhort (n=3,181) and one case control study (1,096 cases and 2,878 controls) also showed an inverse correlation between ovarian cancer and ever having used oral contraceptives (OR, 0.58; 95% CI = 0.46-0.73).  Studies on oral contraceptives and breast cancer have been conflicting, with some studies suggesting that there is not an increased risk for breast cancer in BRCA mutation carriers, while others suggest that there could be a risk.  That said, two meta-analysis studies have shown that there is not an increased risk for breast cancer with oral contraceptive use in BRCA1 and BRCA2 carriers.    In individuals who have a prophylactic bilateral salpino-oophorectomy (BSO), the risk for breast cancer is reduced by up to 50%.  It has been reported that short term hormone replacement therapy in females undergoing prophylactic BSO does not negate the reduction of breast cancer risk associated with surgery (1.2018 NCCN guidelines).  FAMILY MEMBERS: It is important that all of Ms. Mancias's relatives (both males and females) know of the presence of this gene mutation. Site-specific genetic testing can sort out who in the family is at risk and who is not.    Ms. Haralson children and siblings have a 50% chance to have inherited this mutation. We recommend they have genetic testing for this same mutation, as identifying the presence of  this mutation would allow them to also take advantage of risk-reducing measures.   Additionally, individuals with a pathogenic variant in BRCA1 are carriers of Fanconi anemia. Fanconi anemia is an autosomal recessive disorder that is characterized by bone marrow failure and variable presentation of anomalies, including short stature, abnormal skin pigmentation, abnormal thumbs, malformations of the skeletal and central nervous systems, and developmental delay. Risks for leukemia and early onset solid tumors are significantly elevated. For there to be a risk of Fanconi anemia in offspring, both parents would each have to have a single pathogenic variant in [place]; in such a case, the risk of having an affected child is 25%.  SUPPORT AND RESOURCES: If Ms. Covalt is interested in BRCA-specific information and support, there are two groups, Facing Our Risk (www.facingourrisk.com) and Bright Pink (www.brightpink.org) which some people have found useful. They provide opportunities to speak with other individuals from high-risk families. [employer] (www.nothingpink.org) provides funding to those with BRCA mutations. To locate genetic counselors in other cities, visit the website of the [employer] (ArtistMovie.se) and Secretary/administrator for a Social worker by zip code.  We encouraged Ms.  Pitre to remain in contact with [place] on an annual basis so we can update her personal and family histories, and let her know of advances in cancer genetics that may benefit the family. Our contact number was provided. Ms. Benegas questions were answered to her satisfaction today, and she knows she is welcome to call anytime with additional questions.   Nelani Schmelzle M. Joette Catching, [place], [facility] Genetic Counselor [email or web address] (P) [phone]  The patient was seen for a total of 35 minutes in video and audio genetic counseling.

## 2020-07-05 ENCOUNTER — Encounter: Payer: Self-pay | Admitting: Genetic Counselor

## 2020-07-06 ENCOUNTER — Telehealth: Payer: Self-pay | Admitting: Oncology

## 2020-07-06 NOTE — Telephone Encounter (Signed)
I called and spoke with patient regarding her infusion/injection appointments being moved from [place] to [place] location.  She was very thankful that we were able to do this for her.  Per 1/12 sch msg & secure chat

## 2020-07-07 ENCOUNTER — Ambulatory Visit: Payer: No Typology Code available for payment source

## 2020-07-07 ENCOUNTER — Inpatient Hospital Stay (HOSPITAL_BASED_OUTPATIENT_CLINIC_OR_DEPARTMENT_OTHER): Payer: Medicaid Other | Admitting: Adult Health

## 2020-07-07 ENCOUNTER — Inpatient Hospital Stay: Payer: Medicaid Other | Attending: Oncology

## 2020-07-07 ENCOUNTER — Other Ambulatory Visit: Payer: Self-pay

## 2020-07-07 ENCOUNTER — Encounter: Payer: Self-pay | Admitting: Adult Health

## 2020-07-07 ENCOUNTER — Encounter: Payer: Self-pay | Admitting: Oncology

## 2020-07-07 ENCOUNTER — Inpatient Hospital Stay: Payer: Medicaid Other

## 2020-07-07 ENCOUNTER — Encounter: Payer: Self-pay | Admitting: *Deleted

## 2020-07-07 VITALS — BP 125/85 | HR 99 | Temp 97.2°F | Resp 16 | Ht 71.0 in | Wt 306.3 lb

## 2020-07-07 DIAGNOSIS — F17210 Nicotine dependence, cigarettes, uncomplicated: Secondary | ICD-10-CM | POA: Diagnosis not present

## 2020-07-07 DIAGNOSIS — C50412 Malignant neoplasm of upper-outer quadrant of left female breast: Secondary | ICD-10-CM | POA: Insufficient documentation

## 2020-07-07 DIAGNOSIS — Z6841 Body Mass Index (BMI) 40.0 and over, adult: Secondary | ICD-10-CM | POA: Diagnosis not present

## 2020-07-07 DIAGNOSIS — Z148 Genetic carrier of other disease: Secondary | ICD-10-CM | POA: Diagnosis not present

## 2020-07-07 DIAGNOSIS — Z1502 Genetic susceptibility to malignant neoplasm of ovary: Secondary | ICD-10-CM | POA: Diagnosis not present

## 2020-07-07 DIAGNOSIS — Z170 Estrogen receptor positive status [ER+]: Secondary | ICD-10-CM

## 2020-07-07 DIAGNOSIS — Z1509 Genetic susceptibility to other malignant neoplasm: Secondary | ICD-10-CM | POA: Insufficient documentation

## 2020-07-07 DIAGNOSIS — Z5189 Encounter for other specified aftercare: Secondary | ICD-10-CM | POA: Diagnosis not present

## 2020-07-07 DIAGNOSIS — Z95828 Presence of other vascular implants and grafts: Secondary | ICD-10-CM

## 2020-07-07 DIAGNOSIS — Z1501 Genetic susceptibility to malignant neoplasm of breast: Secondary | ICD-10-CM

## 2020-07-07 DIAGNOSIS — Z5111 Encounter for antineoplastic chemotherapy: Secondary | ICD-10-CM | POA: Insufficient documentation

## 2020-07-07 DIAGNOSIS — R197 Diarrhea, unspecified: Secondary | ICD-10-CM | POA: Insufficient documentation

## 2020-07-07 DIAGNOSIS — R112 Nausea with vomiting, unspecified: Secondary | ICD-10-CM

## 2020-07-07 DIAGNOSIS — Z79899 Other long term (current) drug therapy: Secondary | ICD-10-CM | POA: Diagnosis not present

## 2020-07-07 DIAGNOSIS — Z809 Family history of malignant neoplasm, unspecified: Secondary | ICD-10-CM | POA: Diagnosis not present

## 2020-07-07 DIAGNOSIS — Z5112 Encounter for antineoplastic immunotherapy: Secondary | ICD-10-CM | POA: Insufficient documentation

## 2020-07-07 LAB — CBC WITH DIFFERENTIAL (CANCER CENTER ONLY)
Abs Immature Granulocytes: 0.14 10*3/uL — ABNORMAL HIGH (ref 0.00–0.07)
Basophils Absolute: 0 10*3/uL (ref 0.0–0.1)
Basophils Relative: 0 %
Eosinophils Absolute: 0 10*3/uL (ref 0.0–0.5)
Eosinophils Relative: 0 %
HCT: 37.2 % (ref 36.0–46.0)
Hemoglobin: 11.9 g/dL — ABNORMAL LOW (ref 12.0–15.0)
Immature Granulocytes: 1 %
Lymphocytes Relative: 7 %
Lymphs Abs: 1 10*3/uL (ref 0.7–4.0)
MCH: 27.8 pg (ref 26.0–34.0)
MCHC: 32 g/dL (ref 30.0–36.0)
MCV: 86.9 fL (ref 80.0–100.0)
Monocytes Absolute: 0.3 10*3/uL (ref 0.1–1.0)
Monocytes Relative: 2 %
Neutro Abs: 12.6 10*3/uL — ABNORMAL HIGH (ref 1.7–7.7)
Neutrophils Relative %: 90 %
Platelet Count: 330 10*3/uL (ref 150–400)
RBC: 4.28 MIL/uL (ref 3.87–5.11)
RDW: 15.3 % (ref 11.5–15.5)
WBC Count: 14 10*3/uL — ABNORMAL HIGH (ref 4.0–10.5)
nRBC: 0 % (ref 0.0–0.2)

## 2020-07-07 LAB — CMP (CANCER CENTER ONLY)
ALT: 26 U/L (ref 0–44)
AST: 10 U/L — ABNORMAL LOW (ref 15–41)
Albumin: 3.4 g/dL — ABNORMAL LOW (ref 3.5–5.0)
Alkaline Phosphatase: 70 U/L (ref 38–126)
Anion gap: 7 (ref 5–15)
BUN: 11 mg/dL (ref 6–20)
CO2: 22 mmol/L (ref 22–32)
Calcium: 9.2 mg/dL (ref 8.9–10.3)
Chloride: 109 mmol/L (ref 98–111)
Creatinine: 0.78 mg/dL (ref 0.44–1.00)
GFR, Estimated: >60 mL/min (ref >60)
Glucose, Bld: 262 mg/dL — ABNORMAL HIGH (ref 70–99)
Potassium: 4.2 mmol/L (ref 3.5–5.1)
Sodium: 138 mmol/L (ref 135–145)
Total Bilirubin: <0.2 mg/dL — ABNORMAL LOW (ref 0.3–1.2)
Total Protein: 6.9 g/dL (ref 6.5–8.1)

## 2020-07-07 MED ORDER — HEPARIN SOD (PORK) LOCK FLUSH 100 UNIT/ML IV SOLN
500.0000 [IU] | Freq: Once | INTRAVENOUS | Status: AC | PRN
  Administered 2020-07-07: 500 [IU]
  Filled 2020-07-07: qty 5

## 2020-07-07 MED ORDER — LORAZEPAM 0.5 MG PO TABS: 0.5000 mg | ORAL_TABLET | Freq: Four times a day (QID) | ORAL | 0 refills | Status: DC | PRN

## 2020-07-07 MED ORDER — SODIUM CHLORIDE 0.9% FLUSH
10.0000 mL | Freq: Once | INTRAVENOUS | Status: AC
  Administered 2020-07-07: 10 mL
  Filled 2020-07-07: qty 10

## 2020-07-07 MED ORDER — TRASTUZUMAB-DKST CHEMO 150 MG IV SOLR
6.0000 mg/kg | Freq: Once | INTRAVENOUS | Status: AC
  Administered 2020-07-07: 840 mg via INTRAVENOUS
  Filled 2020-07-07: qty 40

## 2020-07-07 MED ORDER — SODIUM CHLORIDE 0.9 % IV SOLN
150.0000 mg | Freq: Once | INTRAVENOUS | Status: AC
  Administered 2020-07-07: 150 mg via INTRAVENOUS
  Filled 2020-07-07: qty 150

## 2020-07-07 MED ORDER — SODIUM CHLORIDE 0.9 % IV SOLN
75.0000 mg/m2 | Freq: Once | INTRAVENOUS | Status: AC
  Administered 2020-07-07: 200 mg via INTRAVENOUS
  Filled 2020-07-07: qty 20

## 2020-07-07 MED ORDER — DIPHENHYDRAMINE HCL 25 MG PO CAPS
25.0000 mg | ORAL_CAPSULE | Freq: Once | ORAL | Status: AC
  Administered 2020-07-07: 25 mg via ORAL

## 2020-07-07 MED ORDER — SODIUM CHLORIDE 0.9 % IV SOLN
Freq: Once | INTRAVENOUS | Status: AC
  Filled 2020-07-07: qty 250

## 2020-07-07 MED ORDER — PALONOSETRON HCL INJECTION 0.25 MG/5ML
0.2500 mg | Freq: Once | INTRAVENOUS | Status: AC
  Administered 2020-07-07: 0.25 mg via INTRAVENOUS

## 2020-07-07 MED ORDER — SODIUM CHLORIDE 0.9 % IV SOLN
750.0000 mg | Freq: Once | INTRAVENOUS | Status: AC
  Administered 2020-07-07: 750 mg via INTRAVENOUS
  Filled 2020-07-07: qty 75

## 2020-07-07 MED ORDER — DIPHENHYDRAMINE HCL 25 MG PO CAPS
ORAL_CAPSULE | ORAL | Status: AC
  Filled 2020-07-07: qty 1

## 2020-07-07 MED ORDER — HYDROCORTISONE (PERIANAL) 2.5 % EX CREA: 1.0000 "application " | TOPICAL_CREAM | Freq: Two times a day (BID) | CUTANEOUS | 0 refills | Status: DC

## 2020-07-07 MED ORDER — SODIUM CHLORIDE 0.9% FLUSH
10.0000 mL | INTRAVENOUS | Status: DC | PRN
  Administered 2020-07-07: 10 mL
  Filled 2020-07-07: qty 10

## 2020-07-07 MED ORDER — ACETAMINOPHEN 325 MG PO TABS
650.0000 mg | ORAL_TABLET | Freq: Once | ORAL | Status: AC
  Administered 2020-07-07: 650 mg via ORAL

## 2020-07-07 MED ORDER — ACETAMINOPHEN 325 MG PO TABS
ORAL_TABLET | ORAL | Status: AC
  Filled 2020-07-07: qty 2

## 2020-07-07 MED ORDER — PALONOSETRON HCL INJECTION 0.25 MG/5ML
INTRAVENOUS | Status: AC
  Filled 2020-07-07: qty 5

## 2020-07-07 MED ORDER — SODIUM CHLORIDE 0.9 % IV SOLN
10.0000 mg | Freq: Once | INTRAVENOUS | Status: AC
  Administered 2020-07-07: 10 mg via INTRAVENOUS
  Filled 2020-07-07: qty 10

## 2020-07-07 NOTE — Patient Instructions (Signed)
Carboplatin injection What is this medicine? CARBOPLATIN (KAR boe pla tin) is a chemotherapy drug. It targets fast dividing cells, like cancer cells, and causes these cells to die. This medicine is used to treat ovarian cancer and many other cancers. This medicine may be used for other purposes; ask your health care provider or pharmacist if you have questions. COMMON BRAND NAME(S): Paraplatin What should I tell my health care provider before I take this medicine? They need to know if you have any of these conditions:  blood disorders  hearing problems  kidney disease  recent or ongoing radiation therapy  an unusual or allergic reaction to carboplatin, cisplatin, other chemotherapy, other medicines, foods, dyes, or preservatives  pregnant or trying to get pregnant  breast-feeding How should I use this medicine? This drug is usually given as an infusion into a vein. It is administered in a hospital or clinic by a specially trained health care professional. Talk to your pediatrician regarding the use of this medicine in children. Special care may be needed. Overdosage: If you think you have taken too much of this medicine contact a poison control center or emergency room at once. NOTE: This medicine is only for you. Do not share this medicine with others. What if I miss a dose? It is important not to miss a dose. Call your doctor or health care professional if you are unable to keep an appointment. What may interact with this medicine?  medicines for seizures  medicines to increase blood counts like filgrastim, pegfilgrastim, sargramostim  some antibiotics like amikacin, gentamicin, neomycin, streptomycin, tobramycin  vaccines Talk to your doctor or health care professional before taking any of these medicines:  acetaminophen  aspirin  ibuprofen  ketoprofen  naproxen This list may not describe all possible interactions. Give your health care provider a list of all the  medicines, herbs, non-prescription drugs, or dietary supplements you use. Also tell them if you smoke, drink alcohol, or use illegal drugs. Some items may interact with your medicine. What should I watch for while using this medicine? Your condition will be monitored carefully while you are receiving this medicine. You will need important blood work done while you are taking this medicine. This drug may make you feel generally unwell. This is not uncommon, as chemotherapy can affect healthy cells as well as cancer cells. Report any side effects. Continue your course of treatment even though you feel ill unless your doctor tells you to stop. In some cases, you may be given additional medicines to help with side effects. Follow all directions for their use. Call your doctor or health care professional for advice if you get a fever, chills or sore throat, or other symptoms of a cold or flu. Do not treat yourself. This drug decreases your body's ability to fight infections. Try to avoid being around people who are sick. This medicine may increase your risk to bruise or bleed. Call your doctor or health care professional if you notice any unusual bleeding. Be careful brushing and flossing your teeth or using a toothpick because you may get an infection or bleed more easily. If you have any dental work done, tell your dentist you are receiving this medicine. Avoid taking products that contain aspirin, acetaminophen, ibuprofen, naproxen, or ketoprofen unless instructed by your doctor. These medicines may hide a fever. Do not become pregnant while taking this medicine. Women should inform their doctor if they wish to become pregnant or think they might be pregnant. There is a  potential for serious side effects to an unborn child. Talk to your health care professional or pharmacist for more information. Do not breast-feed an infant while taking this medicine. What side effects may I notice from receiving this  medicine? Side effects that you should report to your doctor or health care professional as soon as possible:  allergic reactions like skin rash, itching or hives, swelling of the face, lips, or tongue  signs of infection - fever or chills, cough, sore throat, pain or difficulty passing urine  signs of decreased platelets or bleeding - bruising, pinpoint red spots on the skin, black, tarry stools, nosebleeds  signs of decreased red blood cells - unusually weak or tired, fainting spells, lightheadedness  breathing problems  changes in hearing  changes in vision  chest pain  high blood pressure  low blood counts - This drug may decrease the number of white blood cells, red blood cells and platelets. You may be at increased risk for infections and bleeding.  nausea and vomiting  pain, swelling, redness or irritation at the injection site  pain, tingling, numbness in the hands or feet  problems with balance, talking, walking  trouble passing urine or change in the amount of urine Side effects that usually do not require medical attention (report to your doctor or health care professional if they continue or are bothersome):  hair loss  loss of appetite  metallic taste in the mouth or changes in taste This list may not describe all possible side effects. Call your doctor for medical advice about side effects. You may report side effects to FDA at 1-800-FDA-1088. Where should I keep my medicine? This drug is given in a hospital or clinic and will not be stored at home. NOTE: This sheet is a summary. It may not cover all possible information. If you have questions about this medicine, talk to your doctor, pharmacist, or health care provider.  2021 Elsevier/Gold Standard (2007-09-16 14:38:05) Docetaxel injection What is this medicine? DOCETAXEL (doe se TAX el) is a chemotherapy drug. It targets fast dividing cells, like cancer cells, and causes these cells to die. This medicine  is used to treat many types of cancers like breast cancer, certain stomach cancers, head and neck cancer, lung cancer, and prostate cancer. This medicine may be used for other purposes; ask your health care provider or pharmacist if you have questions. COMMON BRAND NAME(S): Docefrez, Taxotere What should I tell my health care provider before I take this medicine? They need to know if you have any of these conditions:  infection (especially a virus infection such as chickenpox, cold sores, or herpes)  liver disease  low blood counts, like low white cell, platelet, or red cell counts  an unusual or allergic reaction to docetaxel, polysorbate 80, other chemotherapy agents, other medicines, foods, dyes, or preservatives  pregnant or trying to get pregnant  breast-feeding How should I use this medicine? This drug is given as an infusion into a vein. It is administered in a hospital or clinic by a specially trained health care professional. Talk to your pediatrician regarding the use of this medicine in children. Special care may be needed. Overdosage: If you think you have taken too much of this medicine contact a poison control center or emergency room at once. NOTE: This medicine is only for you. Do not share this medicine with others. What if I miss a dose? It is important not to miss your dose. Call your doctor or health care professional  if you are unable to keep an appointment. What may interact with this medicine? Do not take this medicine with any of the following medications:  live virus vaccines This medicine may also interact with the following medications:  aprepitant  certain antibiotics like erythromycin or clarithromycin  certain antivirals for HIV or hepatitis  certain medicines for fungal infections like fluconazole, itraconazole, ketoconazole, posaconazole, or  voriconazole  cimetidine  ciprofloxacin  conivaptan  cyclosporine  dronedarone  fluvoxamine  grapefruit juice  imatinib  verapamil This list may not describe all possible interactions. Give your health care provider a list of all the medicines, herbs, non-prescription drugs, or dietary supplements you use. Also tell them if you smoke, drink alcohol, or use illegal drugs. Some items may interact with your medicine. What should I watch for while using this medicine? Your condition will be monitored carefully while you are receiving this medicine. You will need important blood work done while you are taking this medicine. Call your doctor or health care professional for advice if you get a fever, chills or sore throat, or other symptoms of a cold or flu. Do not treat yourself. This drug decreases your body's ability to fight infections. Try to avoid being around people who are sick. Some products may contain alcohol. Ask your health care professional if this medicine contains alcohol. Be sure to tell all health care professionals you are taking this medicine. Certain medicines, like metronidazole and disulfiram, can cause an unpleasant reaction when taken with alcohol. The reaction includes flushing, headache, nausea, vomiting, sweating, and increased thirst. The reaction can last from 30 minutes to several hours. You may get drowsy or dizzy. Do not drive, use machinery, or do anything that needs mental alertness until you know how this medicine affects you. Do not stand or sit up quickly, especially if you are an older patient. This reduces the risk of dizzy or fainting spells. Alcohol may interfere with the effect of this medicine. Talk to your health care professional about your risk of cancer. You may be more at risk for certain types of cancer if you take this medicine. Do not become pregnant while taking this medicine or for 6 months after stopping it. Women should inform their doctor if  they wish to become pregnant or think they might be pregnant. There is a potential for serious side effects to an unborn child. Talk to your health care professional or pharmacist for more information. Do not breast-feed an infant while taking this medicine or for 1 week after stopping it. Males who get this medicine must use a condom during sex with females who can get pregnant. If you get a woman pregnant, the baby could have birth defects. The baby could die before they are born. You will need to continue wearing a condom for 3 months after stopping the medicine. Tell your health care provider right away if your partner becomes pregnant while you are taking this medicine. This may interfere with the ability to father a child. You should talk to your doctor or health care professional if you are concerned about your fertility. What side effects may I notice from receiving this medicine? Side effects that you should report to your doctor or health care professional as soon as possible:  allergic reactions like skin rash, itching or hives, swelling of the face, lips, or tongue  blurred vision  breathing problems  changes in vision  low blood counts - This drug may decrease the number of white blood  cells, red blood cells and platelets. You may be at increased risk for infections and bleeding.  nausea and vomiting  pain, redness or irritation at site where injected  pain, tingling, numbness in the hands or feet  redness, blistering, peeling, or loosening of the skin, including inside the mouth  signs of decreased platelets or bleeding - bruising, pinpoint red spots on the skin, black, tarry stools, nosebleeds  signs of decreased red blood cells - unusually weak or tired, fainting spells, lightheadedness  signs of infection - fever or chills, cough, sore throat, pain or difficulty passing urine  swelling of the ankle, feet, hands Side effects that usually do not require medical attention  (report to your doctor or health care professional if they continue or are bothersome):  constipation  diarrhea  fingernail or toenail changes  hair loss  loss of appetite  mouth sores  muscle pain This list may not describe all possible side effects. Call your doctor for medical advice about side effects. You may report side effects to FDA at 1-800-FDA-1088. Where should I keep my medicine? This drug is given in a hospital or clinic and will not be stored at home. NOTE: This sheet is a summary. It may not cover all possible information. If you have questions about this medicine, talk to your doctor, pharmacist, or health care provider.  2021 Elsevier/Gold Standard (2019-05-11 19:50:31) Trastuzumab injection for infusion What is this medicine? TRASTUZUMAB (tras TOO zoo mab) is a monoclonal antibody. It is used to treat breast cancer and stomach cancer. This medicine may be used for other purposes; ask your health care provider or pharmacist if you have questions. COMMON BRAND NAME(S): Herceptin, Galvin Proffer, Trazimera What should I tell my health care provider before I take this medicine? They need to know if you have any of these conditions:  heart disease  heart failure  lung or breathing disease, like asthma  an unusual or allergic reaction to trastuzumab, benzyl alcohol, or other medications, foods, dyes, or preservatives  pregnant or trying to get pregnant  breast-feeding How should I use this medicine? This drug is given as an infusion into a vein. It is administered in a hospital or clinic by a specially trained health care professional. Talk to your pediatrician regarding the use of this medicine in children. This medicine is not approved for use in children. Overdosage: If you think you have taken too much of this medicine contact a poison control center or emergency room at once. NOTE: This medicine is only for you. Do not share this  medicine with others. What if I miss a dose? It is important not to miss a dose. Call your doctor or health care professional if you are unable to keep an appointment. What may interact with this medicine? This medicine may interact with the following medications:  certain types of chemotherapy, such as daunorubicin, doxorubicin, epirubicin, and idarubicin This list may not describe all possible interactions. Give your health care provider a list of all the medicines, herbs, non-prescription drugs, or dietary supplements you use. Also tell them if you smoke, drink alcohol, or use illegal drugs. Some items may interact with your medicine. What should I watch for while using this medicine? Visit your doctor for checks on your progress. Report any side effects. Continue your course of treatment even though you feel ill unless your doctor tells you to stop. Call your doctor or health care professional for advice if you get a fever, chills or  sore throat, or other symptoms of a cold or flu. Do not treat yourself. Try to avoid being around people who are sick. You may experience fever, chills and shaking during your first infusion. These effects are usually mild and can be treated with other medicines. Report any side effects during the infusion to your health care professional. Fever and chills usually do not happen with later infusions. Do not become pregnant while taking this medicine or for 7 months after stopping it. Women should inform their doctor if they wish to become pregnant or think they might be pregnant. Women of child-bearing potential will need to have a negative pregnancy test before starting this medicine. There is a potential for serious side effects to an unborn child. Talk to your health care professional or pharmacist for more information. Do not breast-feed an infant while taking this medicine or for 7 months after stopping it. Women must use effective birth control with this  medicine. What side effects may I notice from receiving this medicine? Side effects that you should report to your doctor or health care professional as soon as possible:  allergic reactions like skin rash, itching or hives, swelling of the face, lips, or tongue  chest pain or palpitations  cough  dizziness  feeling faint or lightheaded, falls  fever  general ill feeling or flu-like symptoms  signs of worsening heart failure like breathing problems; swelling in your legs and feet  unusually weak or tired Side effects that usually do not require medical attention (report to your doctor or health care professional if they continue or are bothersome):  bone pain  changes in taste  diarrhea  joint pain  nausea/vomiting  weight loss This list may not describe all possible side effects. Call your doctor for medical advice about side effects. You may report side effects to FDA at 1-800-FDA-1088. Where should I keep my medicine? This drug is given in a hospital or clinic and will not be stored at home. NOTE: This sheet is a summary. It may not cover all possible information. If you have questions about this medicine, talk to your doctor, pharmacist, or health care provider.  2021 [employer]/Gold Standard (2016-06-05 14:37:52)

## 2020-07-07 NOTE — Progress Notes (Signed)
[place]  [phone] [phone]     ID: Sheryl Mcmillan DOB: October 03, 1975  MR#: [id number]  QHU#:[id number]  Patient Care Team: Patient, No Pcp Per as PCP - General (General Practice) [name], RN as Oncology Nurse Navigator [name], RN as Oncology Nurse Navigator [name], MD as Consulting Physician (General Surgery) Magrinat, [name], MD as Consulting Physician (Oncology) [name], MD as Consulting Physician (Radiation Oncology) Scot Dock, NP OTHER MD:  CHIEF COMPLAINT: estrogen receptor negative, Her2 positive breast cancer  CURRENT TREATMENT: Neoadjuvant chemotherapy   INTERVAL HISTORY: Sheryl Mcmillan returns today for follow up of her estrogen receptor negative, Her2 positive breast cancer accompanied by her daughter. She was evaluated in the multidisciplinary breast cancer clinic on 06/01/2020.  Sheryl Mcmillan started neoadjuvant chemotherapy on 06/16/2020.  She is receiving Docetaxel, Carboplatin, Trastuzumab, pertuzumab every 21 days with udenyca on day 3 for growth factor support.  Today is cycle 2 day 1.  She is not receiving Pertuzumab today secondary to increased diarrhea that she experienced with cycle 1.    Since her last visit she was found to be BRCA 2 positive.  She was notified about this by Dr. Donne Hazel and recommended bilateral mastectomies.  She wants to know more about this.    REVIEW OF SYSTEMS: Sheryl Mcmillan is doing quite well today.  She is without fever, chills, chest pain, palpitations, cough, bowel/bladder changes, headaches, nausea, vomiting, or any other concerns.  A detailed ROS was otherwise non contributory.     COVID 19 VACCINATION STATUS:    HISTORY OF CURRENT ILLNESS: From the original intake note:   Sheryl Mcmillan presented with a palpable lump in the outer left breast, which she initially felt approximately 6 months prior. She waited to seek medical attention because she had no insurance.   Eventually she enrolled in the [facility] program and underwent bilateral diagnostic mammography with tomography and left breast ultrasonography at [facility] on 05/10/2020 showing: breast density category B; vaguely palpable 2.5 cm mass in left breast at 1:30; no pathologic left axillary lymphadenopathy or evidence of right breast malignancy.  Accordingly on 05/18/2020 she proceeded to biopsy of the left breast area in question. The pathology from this procedure ([id number]) showed: invasive ductal carcinoma, grade 3. Prognostic indicators significant for: estrogen receptor, 40% positive with weak staining intensity and progesterone receptor, 0% negative. Proliferation marker Ki67 at 50%. HER2 positive by immunohistochemistry (3+).  The patient's subsequent history is as detailed below.   PAST MEDICAL HISTORY: Past Medical History:  Diagnosis Date  . Arthritis   . Breast cancer ([place])   . Family history of non-Hodgkin's lymphoma 06/01/2020  . Hemorrhoids   . Migraines   . Obesity   . PCOS (polycystic ovarian syndrome)     PAST SURGICAL HISTORY: Past Surgical History:  Procedure Laterality Date  . COSMETIC SURGERY    . DILATION AND CURETTAGE OF UTERUS    . HEMORRHOID SURGERY    . IR IMAGING GUIDED PORT INSERTION  06/15/2020    FAMILY HISTORY: Family History  Problem Relation Age of Onset  . Hypertension Mother   . Diabetes Mother   . Cancer Mother 65       unknown primary  . Other Mother        brain tumor; dx 67s  . Non-Hodgkin's lymphoma Brother 50  . Other Maternal Uncle 52       brain tumor  . Breast cancer Other  MGM's niece; dx late 1s   She has no information on her father. Her mother died at age 3 from stage IV cancer. The primary cancer was unknown, but it was not breast.  Sheryl Mcmillan has two half brothers (through her mother), one of which was diagnosed with non-Hodgkin's lymphoma. There is no family history of breast, ovarian, or prostate cancer to her knowledge.     GYNECOLOGIC HISTORY:  Patient's last menstrual period was 06/11/2020. Menarche: 45 years old Age at first live birth: 45 years old [place] P 1 LMP 05/02/2020 Contraceptive: never used HRT n/a  Hysterectomy? no BSO? 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 She is single. Her significant other [name] is a Freight forwarder. She lives at home with [name], her daughter Sheryl Mcmillan,  [name]'s daughter [name] and her 68-monthold daughter [name] Daughter Sheryl Mcmillan age 44 is a bChief Operating Officerat [employer]    ADVANCED DIRECTIVES: She intends to name her daughter Sheryl Birksas her HCPOA.  At the 06/01/2020 visit the patient was given the appropriate documents to complete and notarized at her discretion..[name]  HEALTH MAINTENANCE: Social History   Tobacco Use  . Smoking status: Current Every Day Smoker    Packs/day: 0.50    Years: 10.00    Pack years: 5.00    Types: Cigarettes  . Smokeless tobacco: Never Used  Vaping Use  . Vaping Use: Never used  Substance Use Topics  . Alcohol use: Yes    Comment: occasionally  . Drug use: Yes    Types: Marijuana     Colonoscopy: 2018  PAP: 2018  Bone density: n/a (age)   Allergies  Allergen Reactions  . Crest Tc-Baking Soda [Sodium Fluoride] Hives    Baking Soda    Current Outpatient Medications  Medication Sig Dispense Refill  . hydrocortisone (ANUSOL-HC) 2.5 % rectal cream Place 1 application rectally 2 (two) times daily. 30 g 0  . acetaminophen (TYLENOL) 325 MG tablet Take 650 mg by mouth every 6 (six) hours as needed.    .Marland KitchenDAILY MULTIPLE VITAMINS PO Take by mouth.    . dexamethasone (DECADRON) 4 MG tablet Take 2 tablets (8 mg total) by mouth 2 (two) times daily. Start the day before Taxotere. Then take daily x 3 days after chemotherapy. 30 tablet 1  . diphenoxylate-atropine (LOMOTIL) 2.5-0.025 MG tablet 1 to 2  tablets PO QID prn diarrhea 30 tablet 2  . fluconazole (DIFLUCAN) 200 MG tablet Take 1 tablet (200 mg total) by mouth daily. 7 tablet 0  . Homeopathic Products (LIVER SUPPORT SL) Place under the tongue.    . lidocaine-prilocaine (EMLA) cream Apply to affected area once 30 g 3  . LORazepam (ATIVAN) 0.5 MG tablet Place 1 tablet (0.5 mg total) under the tongue every 6 (six) hours as needed for anxiety. 30 tablet 0  . Phenylephrine-Aspirin (ALKA-SELTZER PLUS SINUS PO) Take by mouth.    . prochlorperazine (COMPAZINE) 10 MG tablet Take 1 tablet (10 mg total) by mouth every 6 (six) hours as needed (Nausea or vomiting). 30 tablet 1   No current facility-administered medications for this visit.   Facility-Administered Medications Ordered in Other Visits  Medication Dose Route Frequency Provider Last Rate Last Admin  . sodium chloride flush (NS) 0.9 % injection 10 mL  10 mL Intravenous PRN Sanoe Hazan, LCharlestine Massed NP        OBJECTIVE: Vitals:  07/07/20 0838  BP: 125/85  Pulse: 99  Resp: 16  Temp: (!) 97.2 F (36.2 C)  SpO2: 99%     Body mass index is 42.72 kg/m.   Wt Readings from Last 3 Encounters:  07/07/20 (!) 306 lb 4.8 oz (138.9 kg)  06/23/20 289 lb 1.6 oz (131.1 kg)  06/20/20 (!) 301 lb 9.6 oz (136.8 kg)  ECOG FS:1 - Symptomatic but completely ambulatory GENERAL: Patient is a tired appear woman in no acute distress HEENT:  Sclerae anicteric.  Mild thrush noted in mouth.   Neck is supple.  NODES:  No cervical, supraclavicular, or axillary lymphadenopathy palpated.  BREAST EXAM:  Difficulty palpating left breast mass LUNGS:  Clear to auscultation bilaterally.  No wheezes or rhonchi. HEART:  Regular rate and rhythm. No murmur appreciated. ABDOMEN:  Soft, nontender.  Positive, normoactive bowel sounds. No organomegaly palpated. EXTREMITIES:  No peripheral edema.   SKIN:  Clear with no obvious rashes or skin changes. No nail dyscrasia. NEURO:  Nonfocal. Well oriented.  Appropriate  affect.    LAB RESULTS:  CMP     Component Value Date/Time   NA 135 06/21/2020 1335   K 4.1 06/21/2020 1335   CL 105 06/21/2020 1335   CO2 23 06/21/2020 1335   GLUCOSE 141 (H) 06/21/2020 1335   BUN 13 06/21/2020 1335   CREATININE 0.73 06/21/2020 1335   CALCIUM 8.5 (L) 06/21/2020 1335   PROT 7.1 06/21/2020 1335   ALBUMIN 3.7 06/21/2020 1335   AST 18 06/21/2020 1335   ALT 33 06/21/2020 1335   ALKPHOS 74 06/21/2020 1335   BILITOT 0.5 06/21/2020 1335   GFRNONAA >60 06/21/2020 1335   GFRAA >60 06/06/2019 1205    No results found for: TOTALPROTELP, ALBUMINELP, A1GS, A2GS, BETS, BETA2SER, GAMS, MSPIKE, SPEI  Lab Results  Component Value Date   WBC 14.0 (H) 07/07/2020   NEUTROABS 12.6 (H) 07/07/2020   HGB 11.9 (L) 07/07/2020   HCT 37.2 07/07/2020   MCV 86.9 07/07/2020   PLT 330 07/07/2020    No results found for: LABCA2  No components found for: YSHUOH729  No results for input(s): INR in the last 168 hours.  No results found for: LABCA2  No results found for: MSX115  No results found for: ZMC802  No results found for: MVV612  No results found for: CA2729  No components found for: HGQUANT  No results found for: CEA1 / No results found for: CEA1   No results found for: AFPTUMOR  No results found for: CHROMOGRNA  No results found for: KPAFRELGTCHN, LAMBDASER, KAPLAMBRATIO (kappa/lambda light chains)  No results found for: HGBA, HGBA2QUANT, HGBFQUANT, HGBSQUAN (Hemoglobinopathy evaluation)   No results found for: LDH  No results found for: IRON, TIBC, IRONPCTSAT (Iron and TIBC)  No results found for: FERRITIN  Urinalysis    Component Value Date/Time   COLORURINE YELLOW 06/06/2019 [place] 06/06/2019 1205   LABSPEC >1.030 (H) 06/06/2019 1205   PHURINE 5.5 06/06/2019 1205   GLUCOSEU NEGATIVE 06/06/2019 1205   HGBUR TRACE (A) 06/06/2019 1205   BILIRUBINUR NEGATIVE 06/06/2019 [place] 06/06/2019 [place] 06/06/2019 1205   UROBILINOGEN 1.0 05/09/2011 1805   NITRITE NEGATIVE 06/06/2019 1205   LEUKOCYTESUR NEGATIVE 06/06/2019 1205    STUDIES: ECHOCARDIOGRAM COMPLETE  Result Date: 06/09/2020    ECHOCARDIOGRAM REPORT   Patient Name:   MARNELL MCDANIEL Date of Exam: 06/09/2020 Medical Rec #:  [id number]  Height:       71.0 in Accession #:    [id number]      Weight:       308.0 lb Date of Birth:  05/04/1976        BSA:          2.533 m Patient Age:    62 years        BP:           118/79 mmHg Patient Gender: F               HR:           83 bpm. Exam Location:  Outpatient Procedure: 2D Echo, 3D Echo, Cardiac Doppler, Color Doppler and Strain Analysis Indications:    Z51.11 Encounter for antineoplastic chemotheraphy  History:        Patient has no prior history of Echocardiogram examinations. No                 prior cardiac history. Breast cacner.  Sonographer:    Roseanna Rainbow Referring Phys: [id number] GUSTAV C MAGRINAT  Sonographer Comments: Technically difficult study due to poor echo windows, patient is morbidly obese, suboptimal parasternal window and no subcostal window. Image acquisition challenging due to patient body habitus. Global longitudinal strain was attempted. IMPRESSIONS  1. Left ventricular ejection fraction, by estimation, is 65 to 70%. The left ventricle has normal function. The left ventricle has no regional wall motion abnormalities. Left ventricular diastolic parameters are consistent with Grade I diastolic dysfunction (impaired relaxation).  2. Right ventricular systolic function is normal. The right ventricular size is normal.  3. The mitral valve is normal in structure. Trivial mitral valve regurgitation.  4. The aortic valve is normal in structure. Aortic valve regurgitation is not visualized. FINDINGS  Left Ventricle: Left ventricular ejection fraction, by estimation, is 65 to 70%. The left ventricle has normal function. The left ventricle has no regional wall motion abnormalities.  The left ventricular internal cavity size was normal in size. There is  no left ventricular hypertrophy. Left ventricular diastolic parameters are consistent with Grade I diastolic dysfunction (impaired relaxation). Right Ventricle: The right ventricular size is normal. No increase in right ventricular wall thickness. Right ventricular systolic function is normal. Left Atrium: Left atrial size was normal in size. Right Atrium: Right atrial size was normal in size. Pericardium: There is no evidence of pericardial effusion. Mitral Valve: The mitral valve is normal in structure. Trivial mitral valve regurgitation. Tricuspid Valve: The tricuspid valve is grossly normal. Tricuspid valve regurgitation is not demonstrated. Aortic Valve: The aortic valve is normal in structure. Aortic valve regurgitation is not visualized. Pulmonic Valve: The pulmonic valve was normal in structure. Pulmonic valve regurgitation is not visualized. Aorta: The aortic root and ascending aorta are structurally normal, with no evidence of dilitation. IAS/Shunts: The atrial septum is grossly normal.  LEFT VENTRICLE PLAX 2D LVIDd:         5.20 cm     Diastology LVIDs:         3.00 cm     LV e' medial:    6.42 cm/s LV PW:         1.10 cm     LV E/e' medial:  11.3 LV IVS:        1.20 cm     LV e' lateral:   8.59 cm/s LVOT diam:     2.20 cm     LV E/e' lateral: 8.4 LV SV:  110 LV SV Index:   44          2D Longitudinal Strain LVOT Area:     3.80 cm    2D Strain GLS Avg:     -27.2 %  LV Volumes (MOD) LV vol d, MOD A2C: 62.6 ml LV vol d, MOD A4C: 78.1 ml LV vol s, MOD A2C: 18.8 ml LV vol s, MOD A4C: 21.8 ml LV SV MOD A2C:     43.8 ml LV SV MOD A4C:     78.1 ml LV SV MOD BP:      51.2 ml RIGHT VENTRICLE RV S prime:     13.70 cm/s TAPSE (M-mode): 2.2 cm LEFT ATRIUM             Index       RIGHT ATRIUM           Index LA diam:        3.40 cm 1.34 cm/m  RA Area:     17.80 cm LA Vol (A2C):   61.2 ml 24.16 ml/m RA Volume:   40.80 ml  16.11 ml/m LA  Vol (A4C):   56.2 ml 22.18 ml/m LA Biplane Vol: 62.6 ml 24.71 ml/m  AORTIC VALVE LVOT Vmax:   149.00 cm/s LVOT Vmean:  111.000 cm/s LVOT VTI:    0.290 m  AORTA Ao Root diam: 3.40 cm MITRAL VALVE MV Area (PHT): 3.42 cm     SHUNTS MV Decel Time: 222 msec     Systemic VTI:  0.29 m MV E velocity: 72.30 cm/s   Systemic Diam: 2.20 cm MV A velocity: 103.00 cm/s MV E/A ratio:  0.70 Mertie Moores MD Electronically signed by Mertie Moores MD Signature Date/Time: 06/09/2020/4:05:48 PM    Final    IR IMAGING GUIDED PORT INSERTION  Result Date: 06/15/2020 CLINICAL DATA:  Left breast carcinoma, needs durable venous access for planned treatment regimen EXAM: TUNNELED PORT CATHETER PLACEMENT WITH ULTRASOUND AND FLUOROSCOPIC GUIDANCE FLUOROSCOPY TIME:  3 mGy; 6 seconds ANESTHESIA/SEDATION: Intravenous Fentanyl 130mg and Versed 481mwere administered as conscious sedation during continuous monitoring of the patient's level of consciousness and physiological / cardiorespiratory status by the radiology RN, with a total moderate sedation time of 15 minutes. TECHNIQUE: The procedure, risks, benefits, and alternatives were explained to the patient. Questions regarding the procedure were encouraged and answered. The patient understands and consents to the procedure. As antibiotic prophylaxis, cefazolin 2 g was ordered pre-procedure and administered intravenously within one hour of incision. Patency of the right IJ vein was confirmed with ultrasound with image documentation. An appropriate skin site was determined. Skin site was marked. Region was prepped using maximum barrier technique including cap and mask, sterile gown, sterile gloves, large sterile sheet, and Chlorhexidine as cutaneous antisepsis. The region was infiltrated locally with 1% lidocaine. Under real-time ultrasound guidance, the right IJ vein was accessed with a 21 gauge micropuncture needle; the needle tip within the vein was confirmed with ultrasound image  documentation. Needle was exchanged over a 018 guidewire for transitional dilator, and vascular measurement was performed. A small incision was made on the right anterior chest wall and a subcutaneous pocket fashioned. The power-injectable port was positioned and its catheter tunneled to the right IJ dermatotomy site. The transitional dilator was exchanged over an Amplatz wire for a peel-away sheath, through which the port catheter, which had been trimmed to the appropriate length, was advanced and positioned under fluoroscopy with its tip at the cavoatrial junction. Spot chest radiograph confirms  good catheter position and no pneumothorax. The port was flushed per protocol. The pocket was closed with deep interrupted and subcuticular continuous 3-0 Monocryl sutures. The incisions were covered with Dermabond then covered with a sterile dressing. The patient tolerated the procedure well. COMPLICATIONS: COMPLICATIONS None immediate IMPRESSION: Technically successful right IJ power-injectable port catheter placement. Ready for routine use. Electronically Signed   By: Lucrezia Europe M.D.   On: 06/15/2020 17:03     ELIGIBLE FOR AVAILABLE RESEARCH PROTOCOL: AET  ASSESSMENT: 45 y.o. [place] woman status post left breast upper outer quadrant biopsy 05/18/2020 for a clinical T2N0, stage IIA invasive ductal carcinoma, grade 3, estrogen receptor weakly positive, progesterone receptor negative, with an MIB-1 of 50%, and HER-2 positive by immunohistochemistry  (1) genetics testing pending  (2) neoadjuvant chemotherapy to consist of carboplatin and docetaxel together with trastuzumab and Pertuzumab every 3 weeks x 6, starting 06/16/2020  (3) trastuzumab to be continued to complete 1 year  (a) echo 06/09/2020 shows EF in the 60-65% range  (4) definitive surgery to follow  (5) adjuvant radiation  (6) antiestrogens   PLAN:  Veleka is doing quite well today.  Her tumor decrease in size indicates an early clinical  response to treatment which is promising.  She will continue on treatment with TCHP, however we have removed Pertuzumab secondary to her diarrhea.  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    Caera will return next week for IV fluids, we will see her virtually next week as well.  I have arranged for her to be able to continue her treatments in [facility], however she would like Dr. Jana Hakim to remain her oncologist.  We are achieving this through my chart video visits.    Nicholas is BRCA 2 positive.  This increases her risk for another breast cancer in the future, and also ovarian cancer.  We discussed briefly bilateral mastectomies, and RRSO, and I placed a referral to Dr. [name] at [facility], and Dr. Berline Lopes at [facility], [place].    I will see her virtually next week to check in and evaluate how she is tolerating her treatment.  She knows to call for any questions that may arise between now and her next appointment.  We are happy to see her sooner if needed.   Total encounter time 30 minutes*  Wilber Bihari, NP 07/07/20 9:04 AM Medical Oncology and Hematology [facility] [place], [place] [address] Tel. [phone]    Fax. [phone]   *Total Encounter Time as defined by the Centers for Medicare and Medicaid Services includes, in addition to the face-to-face time of a patient visit (documented in the note above) non-face-to-face time: obtaining and reviewing outside history, ordering and reviewing medications, tests or procedures, care coordination (communications with other health care professionals or caregivers) and documentation in the medical record.

## 2020-07-08 ENCOUNTER — Other Ambulatory Visit: Payer: Self-pay | Admitting: Adult Health

## 2020-07-08 ENCOUNTER — Other Ambulatory Visit: Payer: Self-pay | Admitting: *Deleted

## 2020-07-08 ENCOUNTER — Telehealth: Payer: Self-pay | Admitting: *Deleted

## 2020-07-08 ENCOUNTER — Inpatient Hospital Stay: Payer: Medicaid Other

## 2020-07-08 ENCOUNTER — Encounter: Payer: Self-pay | Admitting: Adult Health

## 2020-07-08 VITALS — BP 128/74 | HR 93 | Temp 98.7°F | Resp 20

## 2020-07-08 DIAGNOSIS — C50412 Malignant neoplasm of upper-outer quadrant of left female breast: Secondary | ICD-10-CM

## 2020-07-08 DIAGNOSIS — Z5112 Encounter for antineoplastic immunotherapy: Secondary | ICD-10-CM | POA: Diagnosis not present

## 2020-07-08 DIAGNOSIS — Z170 Estrogen receptor positive status [ER+]: Secondary | ICD-10-CM

## 2020-07-08 MED ORDER — PEGFILGRASTIM-CBQV 6 MG/0.6ML ~~LOC~~ SOSY
6.0000 mg | PREFILLED_SYRINGE | Freq: Once | SUBCUTANEOUS | Status: AC
  Administered 2020-07-08: 6 mg via SUBCUTANEOUS

## 2020-07-08 MED ORDER — HYDROCORTISONE (PERIANAL) 2.5 % EX CREA: 1.0000 "application " | TOPICAL_CREAM | Freq: Two times a day (BID) | CUTANEOUS | 0 refills | Status: DC

## 2020-07-08 MED ORDER — PEGFILGRASTIM-CBQV 6 MG/0.6ML ~~LOC~~ SOSY
PREFILLED_SYRINGE | SUBCUTANEOUS | Status: AC
  Filled 2020-07-08: qty 0.6

## 2020-07-08 MED ORDER — LORAZEPAM 0.5 MG PO TABS: 0.5000 mg | ORAL_TABLET | Freq: Four times a day (QID) | ORAL | 0 refills | Status: DC | PRN

## 2020-07-08 MED FILL — LORazepam 0.5 MG TABS: 0.5 | 7 days supply | Qty: 30 | Fill #0

## 2020-07-08 MED FILL — PROCTOZONE-HC 2.5 % CREA: 2.5 | 10 days supply | Qty: 30 | Fill #0

## 2020-07-08 NOTE — Telephone Encounter (Signed)
Called the patient and scheduled a new patient appt for 1/19 at 9:45 am with Dr Denman George; patient to arrive at 9:15 am. Patient aware of the address and phone number for the clinic.

## 2020-07-09 ENCOUNTER — Ambulatory Visit: Payer: No Typology Code available for payment source

## 2020-07-11 ENCOUNTER — Ambulatory Visit: Payer: No Typology Code available for payment source

## 2020-07-11 ENCOUNTER — Telehealth: Payer: Self-pay | Admitting: Adult Health

## 2020-07-11 NOTE — Telephone Encounter (Signed)
No 1/13 los. No changes made to pt's schedule.  

## 2020-07-12 ENCOUNTER — Other Ambulatory Visit: Payer: Self-pay

## 2020-07-12 ENCOUNTER — Inpatient Hospital Stay: Payer: Medicaid Other

## 2020-07-12 ENCOUNTER — Encounter: Payer: Self-pay | Admitting: Gynecologic Oncology

## 2020-07-12 VITALS — BP 134/83 | HR 94 | Temp 98.2°F | Resp 19

## 2020-07-12 DIAGNOSIS — Z95828 Presence of other vascular implants and grafts: Secondary | ICD-10-CM

## 2020-07-12 DIAGNOSIS — Z5112 Encounter for antineoplastic immunotherapy: Secondary | ICD-10-CM | POA: Diagnosis not present

## 2020-07-12 MED ORDER — HEPARIN SOD (PORK) LOCK FLUSH 100 UNIT/ML IV SOLN
500.0000 [IU] | Freq: Once | INTRAVENOUS | Status: AC
  Administered 2020-07-12: 500 [IU]
  Filled 2020-07-12: qty 5

## 2020-07-12 MED ORDER — SODIUM CHLORIDE 0.9% FLUSH
10.0000 mL | Freq: Once | INTRAVENOUS | Status: AC
  Administered 2020-07-12: 10 mL
  Filled 2020-07-12: qty 10

## 2020-07-12 MED ORDER — ONDANSETRON HCL 4 MG/2ML IJ SOLN
8.0000 mg | Freq: Once | INTRAMUSCULAR | Status: AC
  Administered 2020-07-12: 8 mg via INTRAVENOUS

## 2020-07-12 MED ORDER — ONDANSETRON HCL 4 MG/2ML IJ SOLN
INTRAMUSCULAR | Status: AC
  Filled 2020-07-12: qty 4

## 2020-07-12 MED ORDER — SODIUM CHLORIDE 0.9 % IV SOLN
INTRAVENOUS | Status: AC
  Filled 2020-07-12 (×2): qty 250

## 2020-07-12 NOTE — Patient Instructions (Signed)
Dehydration, Adult Dehydration is condition in which there is not enough water or other fluids in the body. This happens when a person loses more fluids than he or she takes in. Important body parts cannot work right without the right amount of fluids. Any loss of fluids from the body can cause dehydration. Dehydration can be mild, worse, or very bad. It should be treated right away to keep it from getting very bad. What are the causes? This condition may be caused by:  Conditions that cause loss of water or other fluids, such as: ? Watery poop (diarrhea). ? Vomiting. ? Sweating a lot. ? Peeing (urinating) a lot.  Not drinking enough fluids, especially when you: ? Are ill. ? Are doing things that take a lot of energy to do.  Other illnesses and conditions, such as fever or infection.  Certain medicines, such as medicines that take extra fluid out of the body (diuretics).  Lack of safe drinking water.  Not being able to get enough water and food. What increases the risk? The following factors may make you more likely to develop this condition:  Having a long-term (chronic) illness that has not been treated the right way, such as: ? Diabetes. ? Heart disease. ? Kidney disease.  Being 65 years of age or older.  Having a disability.  Living in a place that is high above the ground or sea (high in altitude). The thinner, dried air causes more fluid loss.  Doing exercises that put stress on your body for a long time. What are the signs or symptoms? Symptoms of dehydration depend on how bad it is. Mild or worse dehydration  Thirst.  Dry lips or dry mouth.  Feeling dizzy or light-headed, especially when you stand up from sitting.  Muscle cramps.  Your body making: ? Dark pee (urine). Pee may be the color of tea. ? Less pee than normal. ? Less tears than normal.  Headache. Very bad dehydration  Changes in skin. Skin may: ? Be cold to the touch (clammy). ? Be blotchy  or pale. ? Not go back to normal right after you lightly pinch it and let it go.  Little or no tears, pee, or sweat.  Changes in vital signs, such as: ? Fast breathing. ? Low blood pressure. ? Weak pulse. ? Pulse that is more than 100 beats a minute when you are sitting still.  Other changes, such as: ? Feeling very thirsty. ? Eyes that look hollow (sunken). ? Cold hands and feet. ? Being mixed up (confused). ? Being very tired (lethargic) or having trouble waking from sleep. ? Short-term weight loss. ? Loss of consciousness. How is this treated? Treatment for this condition depends on how bad it is. Treatment should start right away. Do not wait until your condition gets very bad. Very bad dehydration is an emergency. You will need to go to a hospital.  Mild or worse dehydration can be treated at home. You may be asked to: ? Drink more fluids. ? Drink an oral rehydration solution (ORS). This drink helps get the right amounts of fluids and salts and minerals in the blood (electrolytes).  Very bad dehydration can be treated: ? With fluids through an IV tube. ? By getting normal levels of salts and minerals in your blood. This is often done by giving salts and minerals through a tube. The tube is passed through your nose and into your stomach. ? By treating the root cause. Follow these instructions at   home: Oral rehydration solution If told by your doctor, drink an ORS:  Make an ORS. Use instructions on the package.  Start by drinking small amounts, about  cup (120 mL) every 5-10 minutes.  Slowly drink more until you have had the amount that your doctor said to have. Eating and drinking  Drink enough clear fluid to keep your pee pale yellow. If you were told to drink an ORS, finish the ORS first. Then, start slowly drinking other clear fluids. Drink fluids such as: ? Water. Do not drink only water. Doing that can make the salt (sodium) level in your body get too low. ? Water  from ice chips you suck on. ? Fruit juice that you have added water to (diluted). ? Low-calorie sports drinks.  Eat foods that have the right amounts of salts and minerals, such as: ? Bananas. ? Oranges. ? Potatoes. ? Tomatoes. ? Spinach.  Do not drink alcohol.  Avoid: ? Drinks that have a lot of sugar. These include:  High-calorie sports drinks.  Fruit juice that you did not add water to.  Soda.  Caffeine. ? Foods that are greasy or have a lot of fat or sugar.         General instructions  Take over-the-counter and prescription medicines only as told by your doctor.  Do not take salt tablets. Doing that can make the salt level in your body get too high.  Return to your normal activities as told by your doctor. Ask your doctor what activities are safe for you.  Keep all follow-up visits as told by your doctor. This is important. Contact a doctor if:  You have pain in your belly (abdomen) and the pain: ? Gets worse. ? Stays in one place.  You have a rash.  You have a stiff neck.  You get angry or annoyed (irritable) more easily than normal.  You are more tired or have a harder time waking than normal.  You feel: ? Weak or dizzy. ? Very thirsty. Get help right away if you have:  Any symptoms of very bad dehydration.  Symptoms of vomiting, such as: ? You cannot eat or drink without vomiting. ? Your vomiting gets worse or does not go away. ? Your vomit has blood or green stuff in it.  Symptoms that get worse with treatment.  A fever.  A very bad headache.  Problems with peeing or pooping (having a bowel movement), such as: ? Watery poop that gets worse or does not go away. ? Blood in your poop (stool). This may cause poop to look black and tarry. ? Not peeing in 6-8 hours. ? Peeing only a small amount of very dark pee in 6-8 hours.  Trouble breathing. These symptoms may be an emergency. Do not wait to see if the symptoms will go away. Get  medical help right away. Call your local emergency services (911 in the [place] Do not drive yourself to the hospital. Summary  Dehydration is a condition in which there is not enough water or other fluids in the body. This happens when a person loses more fluids than he or she takes in.  Treatment for this condition depends on how bad it is. Treatment should be started right away. Do not wait until your condition gets very bad.  Drink enough clear fluid to keep your pee pale yellow. If you were told to drink an oral rehydration solution (ORS), finish the ORS first. Then, start slowly drinking other clear fluids.    Take over-the-counter and prescription medicines only as told by your doctor.  Get help right away if you have any symptoms of very bad dehydration. This information is not intended to replace advice given to you by your health care provider. Make sure you discuss any questions you have with your health care provider. Document Revised: 01/22/2019 Document Reviewed: 01/22/2019 Elsevier Patient Education  2021 Elsevier Inc.  

## 2020-07-13 ENCOUNTER — Inpatient Hospital Stay: Payer: Medicaid Other | Admitting: Gynecologic Oncology

## 2020-07-13 ENCOUNTER — Telehealth: Payer: Self-pay | Admitting: *Deleted

## 2020-07-13 ENCOUNTER — Telehealth: Payer: Self-pay | Admitting: Adult Health

## 2020-07-13 DIAGNOSIS — Z1501 Genetic susceptibility to malignant neoplasm of breast: Secondary | ICD-10-CM

## 2020-07-13 DIAGNOSIS — C50412 Malignant neoplasm of upper-outer quadrant of left female breast: Secondary | ICD-10-CM

## 2020-07-13 NOTE — Telephone Encounter (Signed)
Patient called and rescheduled her appt from today to next week

## 2020-07-13 NOTE — Telephone Encounter (Signed)
Contacted patient to verify mychart video visit for pre reg 

## 2020-07-14 ENCOUNTER — Inpatient Hospital Stay (HOSPITAL_BASED_OUTPATIENT_CLINIC_OR_DEPARTMENT_OTHER): Payer: Medicaid Other | Admitting: Adult Health

## 2020-07-14 ENCOUNTER — Other Ambulatory Visit: Payer: Self-pay | Admitting: Adult Health

## 2020-07-14 DIAGNOSIS — C50412 Malignant neoplasm of upper-outer quadrant of left female breast: Secondary | ICD-10-CM

## 2020-07-14 DIAGNOSIS — Z170 Estrogen receptor positive status [ER+]: Secondary | ICD-10-CM

## 2020-07-14 MED ORDER — DIBUCAINE (PERIANAL) 1 % EX OINT: 1.0000 "application " | TOPICAL_OINTMENT | CUTANEOUS | 1 refills | Status: DC | PRN

## 2020-07-14 MED ORDER — HYDROCORTISONE ACE-PRAMOXINE 1-1 % EX CREA: 1.0000 "application " | TOPICAL_CREAM | Freq: Two times a day (BID) | CUTANEOUS | 0 refills | Status: DC

## 2020-07-14 MED FILL — HYDROCORT-PRAMOXINE 1%-1% C: 1-1 | 15 days supply | Qty: 30 | Fill #0

## 2020-07-14 NOTE — Progress Notes (Signed)
[place]  [phone]) [phone] Fax:([phone]) [phone]     ID: Sheryl Mcmillan DOB: 10/04/1975  MR#: [id number]  YTK#:[id number]  Patient Care Team: Patient, No Pcp Per as PCP - General (General Practice) Sheryl Kaufmann, RN as Oncology Nurse Navigator Sheryl Germany, RN as Oncology Nurse Navigator Sheryl Bookbinder, MD as Consulting Physician (General Surgery) Magrinat, [name], MD as Consulting Physician (Oncology) Sheryl Pray, MD as Consulting Physician (Radiation Oncology) Sheryl Dock, NP OTHER MD:  CHIEF COMPLAINT: estrogen receptor negative, Her2 positive breast cancer  CURRENT TREATMENT: Neoadjuvant chemotherapy  I connected with Sheryl Mcmillan on 07/14/20 at  1:30 PM EST virtually and verified that I am speaking with the correct person using two identifiers.  I discussed the limitations, risks, security and privacy concerns of performing an evaluation and management service virtually and the availability of in person appointments.  I also discussed with the patient that there may be a patient responsible charge related to this service. The patient expressed understanding and agreed to proceed.   Patient location: at home Provider location: [facility] office Others participating in the call:   INTERVAL HISTORY: Sheryl Mcmillan returns today for follow up of her estrogen receptor negative, Her2 positive breast cancer accompanied by her daughter. She was evaluated in the multidisciplinary breast cancer clinic on 06/01/2020.  Sheryl Mcmillan started neoadjuvant chemotherapy on 06/16/2020.  She is receiving Docetaxel, Carboplatin, Trastuzumab, pertuzumab every 21 days with udenyca on day 3 for growth factor support.  Today is cycle 2 day 8.  She did not receive pertuzumab with cycle 2 secondary to diarrhea.  Since her last visit she was found to be BRCA 2 positive.  She was notified about this by Sheryl Mcmillan and recommended bilateral mastectomies.  She has f/u with Dr. Claudia Mcmillan next  week.   REVIEW OF SYSTEMS: Sheryl Mcmillan is doing moderately well today.  She is in the bath tub during our appointment.  She notes that she has difficulty with her hemorrhoids, and so she will sit in the tub after using the restroom for some relief.  She wants to know if there is anything else she can do for these.  She has tried Anusol HC without relief.    She notes that she tolerated chemotherapy much better this time.  She did not vomit, she took her anti nausea medication.  She denies any new issues like peripheral neuropathy, mucositis, bladder changes, fever or chills.  A detailed ROS was otherwise non contributory.     COVID 19 VACCINATION STATUS:    HISTORY OF CURRENT ILLNESS: From the original intake note:   Sheryl Mcmillan presented with a palpable lump in the outer left breast, which she initially felt approximately 6 months prior. She waited to seek medical attention because she had no insurance.  Eventually she enrolled in the BCE[facility] program and underwent bilateral diagnostic mammography with tomography and left breast ultrasonography at [facility] on 05/10/2020 showing: breast density category B; vaguely palpable 2.5 cm mass in left breast at 1:30; no pathologic left axillary lymphadenopathy or evidence of right breast malignancy.  Accordingly on 05/18/2020 she proceeded to biopsy of the left breast area in question. The pathology from this procedure ([id number]) showed: invasive ductal carcinoma, grade 3. Prognostic indicators significant for: estrogen receptor, 40% positive with weak staining intensity and progesterone receptor, 0% negative. Proliferation marker Ki67 at 50%. HER2 positive by immunohistochemistry (3+).  The patient's subsequent history is as detailed below.   PAST MEDICAL HISTORY: Past Medical  History:  Diagnosis Date  . Arthritis   . Breast cancer ([place])   . Family history of non-Hodgkin's lymphoma 06/01/2020  . Hemorrhoids   . Migraines   . Obesity   . PCOS  (polycystic ovarian syndrome)     PAST SURGICAL HISTORY: Past Surgical History:  Procedure Laterality Date  . COSMETIC SURGERY    . DILATION AND CURETTAGE OF UTERUS    . HEMORRHOID SURGERY    . IR IMAGING GUIDED PORT INSERTION  06/15/2020    FAMILY HISTORY: Family History  Problem Relation Age of Onset  . Hypertension Mother   . Diabetes Mother   . Cancer Mother 16       unknown primary  . Other Mother        brain tumor; dx 24s  . Non-Hodgkin's lymphoma Brother 64  . Other Maternal Uncle 52       brain tumor  . Breast cancer Other        MGM's niece; dx late 60s  . Pancreatic cancer Neg Hx   . Colon cancer Neg Hx   . Endometrial cancer Neg Hx   . Prostate cancer Neg Hx   . Ovarian cancer Neg Hx    She has no information on her father. Her mother died at age 6 from stage IV cancer. The primary cancer was unknown, but it was not breast.  Nitisha has two half brothers (through her mother), one of which was diagnosed with non-Hodgkin's lymphoma. There is no family history of breast, ovarian, or prostate cancer to her knowledge.    GYNECOLOGIC HISTORY:  No LMP recorded. Menarche: 45 years old Age at first live birth: 45 years old [place] P 1 LMP 05/02/2020 Contraceptive: never used HRT n/a  Hysterectomy? no BSO? 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 She is single. Her significant other Sheryl Mcmillan is a Freight forwarder. She lives at home with Sheryl Mcmillan, her daughter Sheryl Mcmillan,  Sheryl Mcmillan's daughter Sheryl Mcmillan and her 18-monthold daughter Sheryl Mcmillan Daughter Sheryl Mcmillan age 44 is a bChief Operating Officerat [employer]here in [place] [employer]    ADVANCED DIRECTIVES: She intends to name her daughter Sheryl Birksas her HCPOA.  At the 06/01/2020 visit the patient was given the appropriate documents to complete and notarized at her discretion..[name]  HEALTH MAINTENANCE: Social History   Tobacco Use  .  Smoking status: Current Every Day Smoker    Packs/day: 0.50    Years: 10.00    Pack years: 5.00    Types: Cigarettes  . Smokeless tobacco: Never Used  Vaping Use  . Vaping Use: Never used  Substance Use Topics  . Alcohol use: Yes    Comment: occasionally  . Drug use: Yes    Types: Marijuana     Colonoscopy: 2018  PAP: 2018  Bone density: n/a (age)   Allergies  Allergen Reactions  . Crest Tc-Baking Soda [Sodium Fluoride] Hives    Baking Soda    Current Outpatient Medications  Medication Sig Dispense Refill  . acetaminophen (TYLENOL) 325 MG tablet Take 650 mg by mouth every 6 (six) hours as needed.    .Marland KitchenDAILY MULTIPLE VITAMINS PO Take by mouth.    . dexamethasone (DECADRON) 4 MG tablet Take 2 tablets (8 mg total) by mouth 2 (two) times daily. Start the day before Taxotere. Then take daily x 3 days after chemotherapy. (Patient not taking: Reported on 07/12/2020) 30 tablet 1  .  diphenoxylate-atropine (LOMOTIL) 2.5-0.025 MG tablet 1 to 2 tablets PO QID prn diarrhea 30 tablet 2  . fluconazole (DIFLUCAN) 200 MG tablet Take 1 tablet (200 mg total) by mouth daily. 7 tablet 0  . hydrocortisone (ANUSOL-HC) 2.5 % rectal cream Place 1 application rectally 2 (two) times daily. 30 g 0  . lidocaine-prilocaine (EMLA) cream Apply to affected area once 30 g 3  . LORazepam (ATIVAN) 0.5 MG tablet Place 1 tablet (0.5 mg total) under the tongue every 6 (six) hours as needed for anxiety. (Patient taking differently: Place 0.5 mg under the tongue every 6 (six) hours as needed. Nausea) 30 tablet 0  . prochlorperazine (COMPAZINE) 10 MG tablet Take 1 tablet (10 mg total) by mouth every 6 (six) hours as needed (Nausea or vomiting). 30 tablet 1   No current facility-administered medications for this visit.   Facility-Administered Medications Ordered in Other Visits  Medication Dose Route Frequency Provider Last Rate Last Admin  . sodium chloride flush (NS) 0.9 % injection 10 mL  10 mL Intravenous PRN  Babatunde Seago, Charlestine Massed, NP        OBJECTIVE: Patient appears tired, but in no acute distress. Breathing non labored.  Mood and behavior are normal.  Speech is normal.     LAB RESULTS:  CMP     Component Value Date/Time   NA 138 07/07/2020 0822   K 4.2 07/07/2020 0822   CL 109 07/07/2020 0822   CO2 22 07/07/2020 0822   GLUCOSE 262 (H) 07/07/2020 0822   BUN 11 07/07/2020 0822   CREATININE 0.78 07/07/2020 0822   CALCIUM 9.2 07/07/2020 0822   PROT 6.9 07/07/2020 0822   ALBUMIN 3.4 (L) 07/07/2020 0822   AST 10 (L) 07/07/2020 0822   ALT 26 07/07/2020 0822   ALKPHOS 70 07/07/2020 0822   BILITOT <0.2 (L) 07/07/2020 0822   GFRNONAA >60 07/07/2020 0822   GFRAA >60 06/06/2019 1205    No results found for: TOTALPROTELP, ALBUMINELP, A1GS, A2GS, BETS, BETA2SER, GAMS, MSPIKE, SPEI  Lab Results  Component Value Date   WBC 14.0 (H) 07/07/2020   NEUTROABS 12.6 (H) 07/07/2020   HGB 11.9 (L) 07/07/2020   HCT 37.2 07/07/2020   MCV 86.9 07/07/2020   PLT 330 07/07/2020    No results found for: LABCA2  No components found for: LKGMWN027  No results for input(s): INR in the last 168 hours.  No results found for: LABCA2  No results found for: OZD664  No results found for: QIH474  No results found for: QVZ563  No results found for: CA2729  No components found for: HGQUANT  No results found for: CEA1 / No results found for: CEA1   No results found for: AFPTUMOR  No results found for: CHROMOGRNA  No results found for: KPAFRELGTCHN, LAMBDASER, KAPLAMBRATIO (kappa/lambda light chains)  No results found for: HGBA, HGBA2QUANT, HGBFQUANT, HGBSQUAN (Hemoglobinopathy evaluation)   No results found for: LDH  No results found for: IRON, TIBC, IRONPCTSAT (Iron and TIBC)  No results found for: FERRITIN  Urinalysis    Component Value Date/Time   COLORURINE YELLOW 06/06/2019 [place] 06/06/2019 1205   LABSPEC >1.030 (H) 06/06/2019 1205   PHURINE 5.5  06/06/2019 [place] 06/06/2019 1205   HGBUR TRACE (A) 06/06/2019 [place] 06/06/2019 [place] 06/06/2019 [place] 06/06/2019 1205   UROBILINOGEN 1.0 05/09/2011 1805   NITRITE NEGATIVE 06/06/2019 [place] 06/06/2019 1205  STUDIES: IR IMAGING GUIDED PORT INSERTION  Result Date: 06/15/2020 CLINICAL DATA:  Left breast carcinoma, needs durable venous access for planned treatment regimen EXAM: TUNNELED PORT CATHETER PLACEMENT WITH ULTRASOUND AND FLUOROSCOPIC GUIDANCE FLUOROSCOPY TIME:  3 mGy; 6 seconds ANESTHESIA/SEDATION: Intravenous Fentanyl 164mg and Versed 444mwere administered as conscious sedation during continuous monitoring of the patient's level of consciousness and physiological / cardiorespiratory status by the radiology RN, with a total moderate sedation time of 15 minutes. TECHNIQUE: The procedure, risks, benefits, and alternatives were explained to the patient. Questions regarding the procedure were encouraged and answered. The patient understands and consents to the procedure. As antibiotic prophylaxis, cefazolin 2 g was ordered pre-procedure and administered intravenously within one hour of incision. Patency of the right IJ vein was confirmed with ultrasound with image documentation. An appropriate skin site was determined. Skin site was marked. Region was prepped using maximum barrier technique including cap and mask, sterile gown, sterile gloves, large sterile sheet, and Chlorhexidine as cutaneous antisepsis. The region was infiltrated locally with 1% lidocaine. Under real-time ultrasound guidance, the right IJ vein was accessed with a 21 gauge micropuncture needle; the needle tip within the vein was confirmed with ultrasound image documentation. Needle was exchanged over a 018 guidewire for transitional dilator, and vascular measurement was performed. A small incision was made on the right anterior  chest wall and a subcutaneous pocket fashioned. The power-injectable port was positioned and its catheter tunneled to the right IJ dermatotomy site. The transitional dilator was exchanged over an Amplatz wire for a peel-away sheath, through which the port catheter, which had been trimmed to the appropriate length, was advanced and positioned under fluoroscopy with its tip at the cavoatrial junction. Spot chest radiograph confirms good catheter position and no pneumothorax. The port was flushed per protocol. The pocket was closed with deep interrupted and subcuticular continuous 3-0 Monocryl sutures. The incisions were covered with Dermabond then covered with a sterile dressing. The patient tolerated the procedure well. COMPLICATIONS: COMPLICATIONS None immediate IMPRESSION: Technically successful right IJ power-injectable port catheter placement. Ready for routine use. Electronically Signed   By: D Lucrezia Europe.D.   On: 06/15/2020 17:03     ELIGIBLE FOR AVAILABLE RESEARCH PROTOCOL: AET  ASSESSMENT: [age].o. [place] woman status post left breast upper outer quadrant biopsy 05/18/2020 for a clinical T2N0, stage IIA invasive ductal carcinoma, grade 3, estrogen receptor weakly positive, progesterone receptor negative, with an MIB-1 of 50%, and HER-2 positive by immunohistochemistry  (1) genetics testing pending  (2) neoadjuvant chemotherapy to consist of carboplatin and docetaxel together with trastuzumab and Pertuzumab every 3 weeks x 6, starting 06/16/2020  (3) trastuzumab to be continued to complete 1 year  (a) echo 06/09/2020 shows EF in the 60-65% range  (4) definitive surgery to follow  (5) adjuvant radiation  (6) antiestrogens   PLAN:  AnArmandos doing better than she was at this time after her last cycle.  Removing the pertuzmab certainly seems to have helped.  She is however still struggling with hemorrhoids.  I sent in proctofoam, and some bupivicaine rectal cream for her to use.  I also  recommended witch Mcmillan pads to apply after defecation to help with drying out the hemorrhoids.  She is going to work on these.    Sheryl Mcmillan overall and otherwise is doing quite well.  She was recommended to continue with fluid intake and to be as active as she can.  We will see her back in 2 weeks prior to  her next treatment.  She knows to call for any questions that may arise between now and her next appointment.  We are happy to see her sooner if needed.   Follow up instructions:    -Return to cancer center 2 weeks    The patient was provided an opportunity to ask questions and all were answered. The patient agreed with the plan and demonstrated an understanding of the instructions.   The patient was advised to call back or seek an in-person evaluation if the symptoms worsen or if the condition fails to improve as anticipated.   I provided 30 minutes of face-to-face video visit time during this encounter, and > 50% was spent counseling as documented under my assessment & plan.  Wilber Bihari, NP 07/14/20 1:09 PM Medical Oncology and Hematology [facility] [place], [place] [address] Tel. [phone]    Fax. [phone]   *Total Encounter Time as defined by the Centers for Medicare and Medicaid Services includes, in addition to the face-to-face time of a patient visit (documented in the note above) non-face-to-face time: obtaining and reviewing outside history, ordering and reviewing medications, tests or procedures, care coordination (communications with other health care professionals or caregivers) and documentation in the medical record.

## 2020-07-15 ENCOUNTER — Encounter: Payer: Self-pay | Admitting: Adult Health

## 2020-07-18 ENCOUNTER — Encounter: Payer: Self-pay | Admitting: Gynecologic Oncology

## 2020-07-18 ENCOUNTER — Ambulatory Visit: Payer: Self-pay

## 2020-07-18 ENCOUNTER — Telehealth: Payer: Self-pay | Admitting: Adult Health

## 2020-07-18 MED FILL — HYDROCORT-PRAMOXINE 1%-1% C: 1-1 | 15 days supply | Qty: 30 | Fill #0

## 2020-07-18 NOTE — Telephone Encounter (Signed)
No 1/20 los. No changes made to pt's schedule.  

## 2020-07-19 ENCOUNTER — Encounter: Payer: Self-pay | Admitting: Licensed Clinical Social Worker

## 2020-07-19 ENCOUNTER — Inpatient Hospital Stay: Payer: Medicaid Other

## 2020-07-19 ENCOUNTER — Other Ambulatory Visit (HOSPITAL_COMMUNITY)
Admission: RE | Admit: 2020-07-19 | Discharge: 2020-07-19 | Disposition: A | Discharge status: Home / Self Care | Payer: Medicaid Other | Source: Ambulatory Visit | Attending: Gynecologic Oncology | Admitting: Gynecologic Oncology

## 2020-07-19 ENCOUNTER — Encounter: Payer: Self-pay | Admitting: Gynecologic Oncology

## 2020-07-19 ENCOUNTER — Inpatient Hospital Stay (HOSPITAL_BASED_OUTPATIENT_CLINIC_OR_DEPARTMENT_OTHER): Payer: Medicaid Other | Admitting: Gynecologic Oncology

## 2020-07-19 ENCOUNTER — Other Ambulatory Visit: Payer: Self-pay

## 2020-07-19 VITALS — BP 120/91 | HR 60 | Temp 97.9°F | Resp 18 | Ht 71.0 in | Wt 297.0 lb

## 2020-07-19 DIAGNOSIS — Z124 Encounter for screening for malignant neoplasm of cervix: Secondary | ICD-10-CM

## 2020-07-19 DIAGNOSIS — Z1509 Genetic susceptibility to other malignant neoplasm: Secondary | ICD-10-CM

## 2020-07-19 DIAGNOSIS — Z5112 Encounter for antineoplastic immunotherapy: Secondary | ICD-10-CM | POA: Diagnosis not present

## 2020-07-19 DIAGNOSIS — Z6841 Body Mass Index (BMI) 40.0 and over, adult: Secondary | ICD-10-CM

## 2020-07-19 DIAGNOSIS — Z148 Genetic carrier of other disease: Secondary | ICD-10-CM

## 2020-07-19 DIAGNOSIS — C50412 Malignant neoplasm of upper-outer quadrant of left female breast: Secondary | ICD-10-CM | POA: Diagnosis not present

## 2020-07-19 DIAGNOSIS — Z1502 Genetic susceptibility to malignant neoplasm of ovary: Secondary | ICD-10-CM | POA: Diagnosis not present

## 2020-07-19 DIAGNOSIS — Z1501 Genetic susceptibility to malignant neoplasm of breast: Secondary | ICD-10-CM

## 2020-07-19 NOTE — Progress Notes (Signed)
Consult Note: Gyn-Onc  Consult was requested by Wilber Bihari NP for the evaluation of Sheryl Mcmillan 45 y.o. female  CC:  Chief Complaint  Patient presents with  . BRCA1 gene mutation positive    Assessment/Plan:  Sheryl. Sheryl Mcmillan  is a 45 y.o.  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   The patient is currently undergoing neoadjuvant chemotherapy with a plan for lumpectomy, radiation, and hormonal therapy thereafter.  I discussed with the patient and her daughter how deleterious mutations in BRCA1 can lead to an increased risk (40%) of ovarian and fallopian tube cancer.  I explained that there may also be an increased risk for serous endometrial cancer.  I explained that there are screening test for ovarian cancer such as transvaginal ultrasound and Ca1 25 assessments, however there is no evidence that these can diagnose ovarian cancer at an earlier and will curable stage.  Therefore for that reason we recommend risk reducing surgery after age 82 for women who have deleterious mutations in Sheryl Mcmillan.  Risk reducing surgery for Sheryl Mcmillan would involve or robotic assisted total hysterectomy with BSO.  I explained that this will do surgical menopause (though menopause may already be in place following chemotherapy administration.  I explained that unfortunately she will not be able to receive hormone replacement therapy to ameliorate her symptoms of menopause due to her HR positive breast cancer.  She is currently undergoing active treatment for her cancer.  We will evaluate her ovaries with a transvaginal ultrasound scan and Ca1 25.  Provided these are reassuring, I will see her back in June to further discuss implant of surgery in the fall 2022.   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  The patient was diagnosed with a left-sided breast cancer (stage II, ER positive, HER-2 positive) on May 22, 2020.  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  Planned treatment for her breast cancer was neoadjuvant chemotherapy followed by lumpectomy, radiation, and postradiation hormonal modulation with antiestrogen and possibly anti-HER-2 treatment. She was anticipated to complete adjuvant therapy in the summer 2022.  Her medical history is most significant for morbid obesity with a BMI of 41kg/m2.  Her surgical history is most significant for a D&C.  Her gynecologic history is remarkable for a remote history of an abnormal pap smear. She had never required colposcopy or surgical excision. She reported having had regular gynecologic care. She had one prior SVD.  Her family cancer history is significant for her mother having been diagnosed with widely metastatic cancer of unknown primary from which she quickly died (at age 26). Her mother had undergone a total hysterectomy/BSO at age 43 for unknown reasons.  She works as a Quarry manager.   Current Meds:  Outpatient Encounter Medications as of 07/19/2020  Medication Sig  . dibucaine (NUPERCAINAL) 1 % OINT Place 1 application rectally as needed for hemorrhoids.  . diphenoxylate-atropine (LOMOTIL) 2.5-0.025 MG tablet 1 to 2 tablets PO QID prn diarrhea  . fluconazole (DIFLUCAN) 200 MG tablet Take 1 tablet (200 mg total) by mouth daily.  . hydrocortisone (ANUSOL-HC) 2.5 % rectal cream Place 1 application rectally 2 (two)  times daily.  [name] lidocaine-prilocaine (EMLA) cream Apply to affected area once  . pramoxine-hydrocortisone (PROCTOCREAM-HC) 1-1 % rectal cream Place 1 application rectally 2 (two) times daily.  [name] dexamethasone (DECADRON) 4 MG tablet Take 2 tablets (8 mg total) by mouth 2 (two) times daily. Start the day  before Taxotere. Then take daily x 3 days after chemotherapy. (Patient not taking: No sig reported)  . LORazepam (ATIVAN) 0.5 MG tablet Place 1 tablet (0.5 mg total) under the tongue every 6 (six) hours as needed for anxiety. (Patient not taking: Reported on 07/18/2020)  . prochlorperazine (COMPAZINE) 10 MG tablet Take 1 tablet (10 mg total) by mouth every 6 (six) hours as needed (Nausea or vomiting). (Patient not taking: Reported on 07/18/2020)  . [DISCONTINUED] acetaminophen (TYLENOL) 325 MG tablet Take 650 mg by mouth every 6 (six) hours as needed.  . [DISCONTINUED] DAILY MULTIPLE VITAMINS PO Take by mouth.   Facility-Administered Encounter Medications as of 07/19/2020  Medication  . sodium chloride flush (NS) 0.9 % injection 10 mL    Allergy:  Allergies  Allergen Reactions  . Crest Tc-Baking Soda [Sodium Fluoride] Hives    Baking Soda    Social Hx:   Social History   Socioeconomic History  . Marital status: Single    Spouse name: Not on file  . Number of children: 1  . Years of education: Not on file  . Highest education level: Some college, no degree  Occupational History  . Not on file  Tobacco Use  . Smoking status: Current Every Day Smoker    Packs/day: 0.50    Years: 10.00    Pack years: 5.00    Types: Cigarettes  . Smokeless tobacco: Never Used  Vaping Use  . Vaping Use: Never used  Substance and Sexual Activity  . Alcohol use: Yes    Comment: occasionally  . Drug use: Yes    Types: Marijuana  . Sexual activity: Yes    Birth control/protection: None  Other Topics Concern  . Not on file  Social History Narrative  . Not on file   Social Determinants of Health   Financial Resource Strain: Not on file  Food Insecurity: Not on file  Transportation Needs: No Transportation Needs  . Lack of Transportation (Medical): No  . Lack of Transportation (Non-Medical): No  Physical Activity: Not on file  Stress: Not on file  Social Connections: Not on file  Intimate  Partner Violence: Not on file    Past Surgical Hx:  Past Surgical History:  Procedure Laterality Date  . COSMETIC SURGERY    . DILATION AND CURETTAGE OF UTERUS    . HEMORRHOID SURGERY    . IR IMAGING GUIDED PORT INSERTION  06/15/2020    Past Medical Hx:  Past Medical History:  Diagnosis Date  . Arthritis   . Breast cancer ([place])   . Family history of non-Hodgkin's lymphoma 06/01/2020  . Hemorrhoids   . Migraines   . Obesity   . PCOS (polycystic ovarian syndrome)     Past Gynecological History:  See HPI No LMP recorded.  Family Hx:  Family History  Problem Relation Age of Onset  . Hypertension Mother   . Diabetes Mother   . Cancer Mother 28       unknown primary  . Other Mother        brain tumor; dx 82s  . Non-Hodgkin's lymphoma Brother 43  . Other Maternal Uncle 52       brain tumor  .  Breast cancer Other        MGM's niece; dx late 108s  . Pancreatic cancer Neg Hx   . Colon cancer Neg Hx   . Endometrial cancer Neg Hx   . Prostate cancer Neg Hx   . Ovarian cancer Neg Hx     Review of Systems:  Constitutional  Feels well,   ENT Normal appearing ears and nares bilaterally Skin/Breast  No rash, sores, jaundice, itching, dryness Cardiovascular  No chest pain, shortness of breath, or edema  Pulmonary  No cough or wheeze.  Gastro Intestinal  No nausea, vomitting, or diarrhoea. No bright red blood per rectum, no abdominal pain, change in bowel movement, or constipation.  Genito Urinary  No frequency, urgency, dysuria, irregular menses since starting chemotherapy Musculo Skeletal  No myalgia, arthralgia, joint swelling or pain  Neurologic  No weakness, numbness, change in gait,  Psychology  No depression, anxiety, insomnia.   Vitals:  Blood pressure (!) 120/91, pulse 60, temperature 97.9 F (36.6 C), temperature source Tympanic, resp. rate 18, height _0  (1.803 m), weight 297 lb (134.7 kg), SpO2 100 %.  Physical Exam: WD in NAD Neck  Supple NROM,  without any enlargements.  Lymph Node Survey No cervical supraclavicular or inguinal adenopathy Cardiovascular  Pulse normal rate, regularity and rhythm. S1 and S2 normal.  Lungs  Clear to auscultation bilateraly, without wheezes/crackles/rhonchi. Good air movement.  Skin  No rash/lesions/breakdown  Psychiatry  Alert and oriented to person, place, and time  Abdomen  Normoactive bowel sounds, abdomen soft, non-tender and obese without evidence of hernia. Pannus present. Back No CVA tenderness Genito Urinary  Vulva/vagina: Normal external female genitalia.  No lesions. No discharge or bleeding.  Bladder/urethra:  No lesions or masses, well supported bladder  Vagina: long, normal, no lesions.  Cervix: Normal appearing, no lesions. Pap taken.  Uterus:  Small, mobile, no parametrial involvement or nodularity.  Adnexa: no palpable masses. Rectal  deferred Extremities  No bilateral cyanosis, clubbing or edema.  60 minutes of total time was spent for this patient encounter, including preparation, face-to-face counseling with the patient and coordination of care, review of imaging (results and images), communication with the referring provider and documentation of the encounter.   Thereasa Solo, MD  07/19/2020, 2:31 PM

## 2020-07-19 NOTE — Patient Instructions (Signed)
Dr Denman George took a pap smear and her office will call you with the result. This is screening for cervical cancer (not ovarian cancer). Due to your BRCA 1 gene mutation she has ordered a blood test and ovarian ultrasound which will screen for ovarian cancer.  If these tests are normal, she will see you back in June to discuss a surgery to remove your ovaries, tubes and uterus (with its cervix) to reduce your risk for developing ovarian cancer in the future. This will likely be scheduled for the early fall of 2022 (pending the progress of your breast cancer treatment).   Dr Serita Grit office can be reached at [phone] with questions.

## 2020-07-19 NOTE — Progress Notes (Signed)
[facility] CSW Progress Note  Clinical Therapist, occupational from patient. Submitted for review with supporting letter. [facility] will contact patient directly with decision re: assistance.    Christeen Douglas , LCSW

## 2020-07-20 ENCOUNTER — Telehealth: Payer: Self-pay

## 2020-07-20 LAB — CYTOLOGY - PAP
Comment: NEGATIVE
Diagnosis: NEGATIVE
High risk HPV: NEGATIVE

## 2020-07-20 LAB — CA 125: Cancer Antigen (CA) 125: 11 U/mL (ref 0.0–38.1)

## 2020-07-20 NOTE — Telephone Encounter (Addendum)
[facility] Medicaid approved x 12 months, effective dates 04/25/2020-04/24/2021, MID # [id number] L Patient informed.

## 2020-07-20 NOTE — Telephone Encounter (Signed)
Told Sheryl Mcmillan that the CA-125 was 11.1 on 07-19-20. This is WNL . Normal range is 0- 38.1 Pt verbalized understanding.

## 2020-07-21 ENCOUNTER — Telehealth: Payer: Self-pay

## 2020-07-21 NOTE — Telephone Encounter (Signed)
TC to patient to report negative pap smear and negative HPV results.  Cytology shows shift in flora suggestive of BV.  Patient denied any symptoms of discharge, itching or odor.  Patient appreciative of call and will call with any questions or concerns.

## 2020-07-27 ENCOUNTER — Encounter: Payer: Self-pay | Admitting: Plastic Surgery

## 2020-07-27 ENCOUNTER — Telehealth: Payer: Self-pay | Admitting: Adult Health

## 2020-07-27 ENCOUNTER — Other Ambulatory Visit: Payer: Self-pay

## 2020-07-27 ENCOUNTER — Ambulatory Visit (INDEPENDENT_AMBULATORY_CARE_PROVIDER_SITE_OTHER): Payer: Medicaid Other | Admitting: Plastic Surgery

## 2020-07-27 VITALS — BP 124/84 | HR 64 | Ht 71.0 in | Wt 304.0 lb

## 2020-07-27 DIAGNOSIS — C50412 Malignant neoplasm of upper-outer quadrant of left female breast: Secondary | ICD-10-CM | POA: Diagnosis not present

## 2020-07-27 DIAGNOSIS — Z170 Estrogen receptor positive status [ER+]: Secondary | ICD-10-CM

## 2020-07-27 NOTE — Progress Notes (Signed)
[facility]  Telephone:([phone] Fax:([phone]) [phone]     ID: Sheryl Mcmillan DOB: 07-14-1975  MR#: [id number]  MEQ#:[id number]  Patient Care Team: Patient, No Pcp Per as PCP - General (General Practice) Sheryl Kaufmann, RN as Oncology Nurse Navigator Sheryl Germany, RN as Oncology Nurse Navigator Sheryl Bookbinder, MD as Consulting Physician (General Surgery) Magrinat, [name], MD as Consulting Physician (Oncology) Sheryl Pray, MD as Consulting Physician (Radiation Oncology) Sheryl Dock, NP OTHER MD:  CHIEF COMPLAINT: estrogen receptor negative, Her2 positive breast cancer  CURRENT TREATMENT: Neoadjuvant chemotherapy  I connected with Sheryl Mcmillan on 07/27/20 at  9:00 AM EST by my chart video and verified that I am speaking with the correct person using two identifiers.  I discussed the limitations, risks, security and privacy concerns of performing an evaluation and management service by telephone and the availability of in person appointments.  I also discussed with the patient that there may be a patient responsible charge related to this service. The patient expressed understanding and agreed to proceed.  Patient location: [facility] Provider location: [facility] office  Others participating in call: none   INTERVAL HISTORY: Sheryl Mcmillan returns today for follow up of her estrogen receptor negative, Her2 positive breast cancer accompanied by her daughter. She was evaluated in the multidisciplinary breast cancer clinic on 06/01/2020.  Sheryl Mcmillan started neoadjuvant chemotherapy on 06/16/2020.  She is receiving Docetaxel, Carboplatin, Trastuzumab, pertuzumab every 21 days with udenyca on day 3 for growth factor support.  Today is cycle 3 day 1.    REVIEW OF SYSTEMS: Sheryl Mcmillan is doing moderately well today.  She notes her energy level is improving.  She says her skin is really dry and she applied Udderly cream.  Her hemorrhoids remain and are intermittent.  She continues to  use the dibucaine ointment to the area.  She met with plastic surgery and wants to know if she will need radiation therapy as that will help with her decision making regarding surgery.    Sheryl Mcmillan notes that she is not having any peripheral neuropathy.  Her most recent echo was completed on 06/09/2020 and showed an EF of 65-70%.  She has not had any mucositis, bowel/bladder changes, headaches, vision issues, nausea, vomiting.  She is requesting a refill on Lorazepam which helps when she feels nauseated following chemotherapy in the evenings.     COVID 19 VACCINATION STATUS:    HISTORY OF CURRENT ILLNESS: From the original intake note:   Sheryl Mcmillan presented with a palpable lump in the outer left breast, which she initially felt approximately 6 months prior. She waited to seek medical attention because she had no insurance.  Eventually she enrolled in the [facility] program and underwent bilateral diagnostic mammography with tomography and left breast ultrasonography at [facility] on 05/10/2020 showing: breast density category B; vaguely palpable 2.5 cm mass in left breast at 1:30; no pathologic left axillary lymphadenopathy or evidence of right breast malignancy.  Accordingly on 05/18/2020 she proceeded to biopsy of the left breast area in question. The pathology from this procedure ([id number]) showed: invasive ductal carcinoma, grade 3. Prognostic indicators significant for: estrogen receptor, 40% positive with weak staining intensity and progesterone receptor, 0% negative. Proliferation marker Ki67 at 50%. HER2 positive by immunohistochemistry (3+).  The patient's subsequent history is as detailed below.   PAST MEDICAL HISTORY: Past Medical History:  Diagnosis Date  . Arthritis   . Breast cancer ([place])   . Family history of non-Hodgkin's lymphoma  06/01/2020  . Hemorrhoids   . Migraines   . Obesity   . PCOS (polycystic ovarian syndrome)     PAST SURGICAL HISTORY: Past Surgical  History:  Procedure Laterality Date  . COSMETIC SURGERY    . DILATION AND CURETTAGE OF UTERUS    . HEMORRHOID SURGERY    . IR IMAGING GUIDED PORT INSERTION  06/15/2020    FAMILY HISTORY: Family History  Problem Relation Age of Onset  . Hypertension Mother   . Diabetes Mother   . Cancer Mother 61       unknown primary  . Other Mother        brain tumor; dx 40s  . Non-Hodgkin's lymphoma Brother 19  . Other Maternal Uncle 52       brain tumor  . Breast cancer Other        MGM's niece; dx late 90s  . Pancreatic cancer Neg Hx   . Colon cancer Neg Hx   . Endometrial cancer Neg Hx   . Prostate cancer Neg Hx   . Ovarian cancer Neg Hx    She has no information on her father. Her mother died at age 5 from stage IV cancer. The primary cancer was unknown, but it was not breast.  Sheryl Mcmillan has two half brothers (through her mother), one of which was diagnosed with non-Hodgkin's lymphoma. There is no family history of breast, ovarian, or prostate cancer to her knowledge.    GYNECOLOGIC HISTORY:  No LMP recorded. Menarche: 45 years old Age at first live birth: 45 years old [place] P 1 LMP 05/02/2020 Contraceptive: never used HRT n/a  Hysterectomy? no BSO? 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 She is single. Her significant other Sheryl Mcmillan is a Freight forwarder. She lives at home with Sheryl Mcmillan, her daughter Sheryl Mcmillan,  [name]'s daughter Sheryl Mcmillan and her 34-monthold daughter Sheryl Mcmillan Daughter Sheryl Mcmillan age 44 is a bChief Operating Officerat [employer] [employer]    ADVANCED DIRECTIVES: She intends to name her daughter Sheryl Mcmillan her HCPOA.  At the 06/01/2020 visit the patient was given the appropriate documents to complete and notarized at her discretion..[name]  HEALTH MAINTENANCE: Social History   Tobacco Use  . Smoking status: Current Every Day Smoker    Packs/day: 0.50    Years: 10.00     Pack years: 5.00    Types: Cigarettes  . Smokeless tobacco: Never Used  Vaping Use  . Vaping Use: Never used  Substance Use Topics  . Alcohol use: Yes    Comment: occasionally  . Drug use: Yes    Types: Marijuana     Colonoscopy: 2018  PAP: 2018  Bone density: n/a (age)   Allergies  Allergen Reactions  . Crest Tc-Baking Soda [Sodium Fluoride] Hives    Baking Soda    Current Outpatient Medications  Medication Sig Dispense Refill  . dexamethasone (DECADRON) 4 MG tablet Take 2 tablets (8 mg total) by mouth 2 (two) times daily. Start the day before Taxotere. Then take daily x 3 days after chemotherapy. 30 tablet 1  . dibucaine (NUPERCAINAL) 1 % OINT Place 1 application rectally as needed for hemorrhoids. 28 g 1  . diphenoxylate-atropine (LOMOTIL) 2.5-0.025 MG tablet 1 to 2 tablets PO QID prn diarrhea 30 tablet 2  . fluconazole (DIFLUCAN) 200 MG tablet Take 1 tablet (200 mg total) by mouth daily. 7 tablet 0  . hydrocortisone (  ANUSOL-HC) 2.5 % rectal cream Place 1 application rectally 2 (two) times daily. 30 g 0  . lidocaine-prilocaine (EMLA) cream Apply to affected area once 30 g 3  . LORazepam (ATIVAN) 0.5 MG tablet Place 1 tablet (0.5 mg total) under the tongue every 6 (six) hours as needed for anxiety. 30 tablet 0  . pramoxine-hydrocortisone (PROCTOCREAM-HC) 1-1 % rectal cream Place 1 application rectally 2 (two) times daily. 30 g 0  . prochlorperazine (COMPAZINE) 10 MG tablet Take 1 tablet (10 mg total) by mouth every 6 (six) hours as needed (Nausea or vomiting). 30 tablet 1   No current facility-administered medications for this visit.   Facility-Administered Medications Ordered in Other Visits  Medication Dose Route Frequency Provider Last Rate Last Admin  . sodium chloride flush (NS) 0.9 % injection 10 mL  10 mL Intravenous PRN Jacody Beneke, Charlestine Massed, NP        OBJECTIVE: See VS in [facility] from [facility] Patient appears well, she is in no apparent distress.   Skin visualized without rash or lesion.  Breathing non labored, mood and behavior are normal.      LAB RESULTS:  CMP     Component Value Date/Time   NA 138 07/07/2020 0822   K 4.2 07/07/2020 0822   CL 109 07/07/2020 0822   CO2 22 07/07/2020 0822   GLUCOSE 262 (H) 07/07/2020 0822   BUN 11 07/07/2020 0822   CREATININE 0.78 07/07/2020 0822   CALCIUM 9.2 07/07/2020 0822   PROT 6.9 07/07/2020 0822   ALBUMIN 3.4 (L) 07/07/2020 0822   AST 10 (L) 07/07/2020 0822   ALT 26 07/07/2020 0822   ALKPHOS 70 07/07/2020 0822   BILITOT <0.2 (L) 07/07/2020 0822   GFRNONAA >60 07/07/2020 0822   GFRAA >60 06/06/2019 1205    No results found for: TOTALPROTELP, ALBUMINELP, A1GS, A2GS, BETS, BETA2SER, GAMS, MSPIKE, SPEI  Lab Results  Component Value Date   WBC 14.0 (H) 07/07/2020   NEUTROABS 12.6 (H) 07/07/2020   HGB 11.9 (L) 07/07/2020   HCT 37.2 07/07/2020   MCV 86.9 07/07/2020   PLT 330 07/07/2020    No results found for: LABCA2  No components found for: MLYYTK354  No results for input(s): INR in the last 168 hours.  No results found for: LABCA2  No results found for: CAN199  Lab Results  Component Value Date   CAN125 11.0 07/19/2020    No results found for: SFK812  No results found for: CA2729  No components found for: HGQUANT  No results found for: CEA1 / No results found for: CEA1   No results found for: AFPTUMOR  No results found for: CHROMOGRNA  No results found for: KPAFRELGTCHN, LAMBDASER, KAPLAMBRATIO (kappa/lambda light chains)  No results found for: HGBA, HGBA2QUANT, HGBFQUANT, HGBSQUAN (Hemoglobinopathy evaluation)   No results found for: LDH  No results found for: IRON, TIBC, IRONPCTSAT (Iron and TIBC)  No results found for: FERRITIN  Urinalysis    Component Value Date/Time   COLORURINE YELLOW 06/06/2019 [place] 06/06/2019 1205   LABSPEC >1.030 (H) 06/06/2019 1205   PHURINE 5.5 06/06/2019 [place] 06/06/2019  1205   HGBUR TRACE (A) 06/06/2019 [place] 06/06/2019 [place] 06/06/2019 [place] 06/06/2019 1205   UROBILINOGEN 1.0 05/09/2011 1805   NITRITE NEGATIVE 06/06/2019 [place] 06/06/2019 1205    STUDIES: No results found.   ELIGIBLE FOR AVAILABLE RESEARCH PROTOCOL:  AET  ASSESSMENT: 45 y.o. [place] woman status post left breast upper outer quadrant biopsy 05/18/2020 for a clinical T2N0, stage IIA invasive ductal carcinoma, grade 3, estrogen receptor weakly positive, progesterone receptor negative, with an MIB-1 of 50%, and HER-2 positive by immunohistochemistry  (1) genetics testing pending  (2) neoadjuvant chemotherapy to consist of carboplatin and docetaxel together with trastuzumab and Pertuzumab every 3 weeks x 6, starting 06/16/2020  (3) trastuzumab to be continued to complete 1 year  (a) echo 06/09/2020 shows EF in the 60-65% range  (4) definitive surgery to follow  (5) adjuvant radiation  (6) antiestrogens   PLAN:  Lainee was seen virtually today prior to receiving chemotherapy at [facility] for her stage IIA HER-2 positive breast cancer.  She is tolerating chemotherapy well thus far, and with her third cycle we will continue to forego the pertuzumab.  She will only receive docetaxel, carboplatin, and trastuzumab.    Meckenzie is tolerating treatment moderately well.  She has not developed peripheral neuropathy which we are monitoring her closely for.  She is using udderly cream for her dry skin, and she will return on Monday following fluids for hydration as this helps keep her feeling well and prevents her from getting dehydrated like she did after her first cycle of treatment.    We reviewed her activity level.  We will see her again in person on 08/17/2020 for labs, f/u with Dr. Jana Hakim and she will receive cycle 4 the following day.  She knows to call for any issues that may arise between  now and her next appointment.      Follow up instructions:    -Return to cancer center 08/17/2020     The patient was provided an opportunity to ask questions and all were answered. The patient agreed with the plan and demonstrated an understanding of the instructions.   The patient was advised to call back or seek an in-person evaluation if the symptoms worsen or if the condition fails to improve as anticipated.   I provided 25 minutes of face-to-face video visit time during this encounter, and > 50% was spent counseling as documented under my assessment & plan.   Wilber Bihari, NP 07/27/20 8:16 PM Medical Oncology and Hematology [facility] [address], [place] [address] Tel. [phone]    Fax. [phone]   *Total Encounter Time as defined by the Centers for Medicare and Medicaid Services includes, in addition to the face-to-face time of a patient visit (documented in the note above) non-face-to-face time: obtaining and reviewing outside history, ordering and reviewing medications, tests or procedures, care coordination (communications with other health care professionals or caregivers) and documentation in the medical record.

## 2020-07-27 NOTE — Telephone Encounter (Signed)
Contacted patient to verify mychart video visit for pre reg 

## 2020-07-27 NOTE — Progress Notes (Signed)
Referring Provider Gardenia Phlegm, NP [place],  [place] [address]   CC:  Chief Complaint  Patient presents with  . consult      Sheryl Mcmillan is an 45 y.o. female.  HPI: Patient presents to discuss breast reconstruction.  She found left breast mass several months ago and this was ultimately biopsied and determined to be cancer.  She was recommended to undergo neoadjuvant chemotherapy which she is currently doing.  In the interim she received genetic testing and was positive for the BRCA mutation.  While initially her plan was to do a lumpectomy and radiation after the genetic testing she is considering bilateral mastectomies.  She is here to talk about her reconstructive options after the fact if she were to go that route.  She is not expecting to need postoperative radiation if she has mastectomies but that will ultimately depend on the final pathology.  Allergies  Allergen Reactions  . Crest Tc-Baking Soda [Sodium Fluoride] Hives    Baking Soda    Outpatient Encounter Medications as of 07/27/2020  Medication Sig  . dexamethasone (DECADRON) 4 MG tablet Take 2 tablets (8 mg total) by mouth 2 (two) times daily. Start the day before Taxotere. Then take daily x 3 days after chemotherapy.  . dibucaine (NUPERCAINAL) 1 % OINT Place 1 application rectally as needed for hemorrhoids.  . diphenoxylate-atropine (LOMOTIL) 2.5-0.025 MG tablet 1 to 2 tablets PO QID prn diarrhea  . fluconazole (DIFLUCAN) 200 MG tablet Take 1 tablet (200 mg total) by mouth daily.  . hydrocortisone (ANUSOL-HC) 2.5 % rectal cream Place 1 application rectally 2 (two) times daily.  [name] lidocaine-prilocaine (EMLA) cream Apply to affected area once  . LORazepam (ATIVAN) 0.5 MG tablet Place 1 tablet (0.5 mg total) under the tongue every 6 (six) hours as needed for anxiety.  . pramoxine-hydrocortisone (PROCTOCREAM-HC) 1-1 % rectal cream Place 1 application rectally 2 (two) times daily.  .  prochlorperazine (COMPAZINE) 10 MG tablet Take 1 tablet (10 mg total) by mouth every 6 (six) hours as needed (Nausea or vomiting).   Facility-Administered Encounter Medications as of 07/27/2020  Medication  . sodium chloride flush (NS) 0.9 % injection 10 mL     Past Medical History:  Diagnosis Date  . Arthritis   . Breast cancer ([place])   . Family history of non-Hodgkin's lymphoma 06/01/2020  . Hemorrhoids   . Migraines   . Obesity   . PCOS (polycystic ovarian syndrome)     Past Surgical History:  Procedure Laterality Date  . COSMETIC SURGERY    . DILATION AND CURETTAGE OF UTERUS    . HEMORRHOID SURGERY    . IR IMAGING GUIDED PORT INSERTION  06/15/2020    Family History  Problem Relation Age of Onset  . Hypertension Mother   . Diabetes Mother   . Cancer Mother 62       unknown primary  . Other Mother        brain tumor; dx 26s  . Non-Hodgkin's lymphoma Brother 81  . Other Maternal Uncle 52       brain tumor  . Breast cancer Other        MGM's niece; dx late 57s  . Pancreatic cancer Neg Hx   . Colon cancer Neg Hx   . Endometrial cancer Neg Hx   . Prostate cancer Neg Hx   . Ovarian cancer Neg Hx     Social History   Social History Narrative  . Not  on file     Review of Systems General: Denies fevers, chills, weight loss CV: Denies chest pain, shortness of breath, palpitations  Physical Exam Vitals with BMI 07/27/2020 07/19/2020 07/12/2020  Height 5' 11"  5' 11"  -  Weight 304 lbs 297 lbs -  BMI 39.35 94.09 -  Systolic 050 256 154  Diastolic 84 91 83  Pulse 64 60 94    General:  No acute distress,  Alert and oriented, Non-Toxic, Normal speech and affect Breast: She has grade 3 ptosis.  She has significant excess skin.  We can both feel the mass in her left breast.  I do not see any obvious scars.  Base width 13.5 cm.  Assessment/Plan I had a long discussion with the patient about her options.  I think at this point she is leaning towards bilateral mastectomies.   I explained she does have some risk factors for reconstruction.  Her BMI and her smoking status put her in a high risk category.  I explained the general process of implant-based reconstruction with a tissue expander being placed at the time of the mastectomy followed by implants at a later date.  I explained due to her skin excess we would likely do a Wise pattern excision for her mastectomy.  I explained the expander will gradually be inflated and ultimately switched to an implant.  I explained the risks of the procedure that include bleeding, infection, damage to surrounding structures and need for additional procedures.  I explained if she were to have a wound healing complication it resulted in exposure of the implant that it would need to be removed in time would need the past prior to making another attempt at reconstruction.  All of her questions were answered and we will plan to be available to do this if she chooses.  [name] 07/27/2020, 3:45 PM

## 2020-07-28 ENCOUNTER — Other Ambulatory Visit: Payer: No Typology Code available for payment source

## 2020-07-28 ENCOUNTER — Inpatient Hospital Stay: Payer: Medicaid Other

## 2020-07-28 ENCOUNTER — Encounter: Payer: Self-pay | Admitting: Adult Health

## 2020-07-28 ENCOUNTER — Inpatient Hospital Stay: Payer: Medicaid Other | Attending: Oncology | Admitting: Adult Health

## 2020-07-28 ENCOUNTER — Ambulatory Visit: Payer: No Typology Code available for payment source

## 2020-07-28 ENCOUNTER — Other Ambulatory Visit: Payer: Self-pay

## 2020-07-28 ENCOUNTER — Other Ambulatory Visit: Payer: Self-pay | Admitting: Adult Health

## 2020-07-28 ENCOUNTER — Encounter: Payer: Self-pay | Admitting: Oncology

## 2020-07-28 DIAGNOSIS — Z5111 Encounter for antineoplastic chemotherapy: Secondary | ICD-10-CM | POA: Insufficient documentation

## 2020-07-28 DIAGNOSIS — Z79899 Other long term (current) drug therapy: Secondary | ICD-10-CM | POA: Insufficient documentation

## 2020-07-28 DIAGNOSIS — Z5189 Encounter for other specified aftercare: Secondary | ICD-10-CM | POA: Diagnosis not present

## 2020-07-28 DIAGNOSIS — Z5112 Encounter for antineoplastic immunotherapy: Secondary | ICD-10-CM | POA: Insufficient documentation

## 2020-07-28 DIAGNOSIS — Z170 Estrogen receptor positive status [ER+]: Secondary | ICD-10-CM

## 2020-07-28 DIAGNOSIS — R112 Nausea with vomiting, unspecified: Secondary | ICD-10-CM

## 2020-07-28 DIAGNOSIS — C50412 Malignant neoplasm of upper-outer quadrant of left female breast: Secondary | ICD-10-CM | POA: Diagnosis present

## 2020-07-28 DIAGNOSIS — Z1501 Genetic susceptibility to malignant neoplasm of breast: Secondary | ICD-10-CM

## 2020-07-28 DIAGNOSIS — Z1509 Genetic susceptibility to other malignant neoplasm: Secondary | ICD-10-CM

## 2020-07-28 DIAGNOSIS — L853 Xerosis cutis: Secondary | ICD-10-CM | POA: Insufficient documentation

## 2020-07-28 LAB — CMP (CANCER CENTER ONLY)
ALT: 26 U/L (ref 0–44)
AST: 10 U/L — ABNORMAL LOW (ref 15–41)
Albumin: 4.2 g/dL (ref 3.5–5.0)
Alkaline Phosphatase: 67 U/L (ref 38–126)
Anion gap: 8 (ref 5–15)
BUN: 12 mg/dL (ref 6–20)
CO2: 23 mmol/L (ref 22–32)
Calcium: 9.3 mg/dL (ref 8.9–10.3)
Chloride: 107 mmol/L (ref 98–111)
Creatinine: 0.72 mg/dL (ref 0.44–1.00)
GFR, Estimated: >60 mL/min (ref >60)
Glucose, Bld: 198 mg/dL — ABNORMAL HIGH (ref 70–99)
Potassium: 4.3 mmol/L (ref 3.5–5.1)
Sodium: 138 mmol/L (ref 135–145)
Total Bilirubin: 0.2 mg/dL — ABNORMAL LOW (ref 0.3–1.2)
Total Protein: 6.7 g/dL (ref 6.5–8.1)

## 2020-07-28 LAB — CBC WITH DIFFERENTIAL (CANCER CENTER ONLY)
Abs Immature Granulocytes: 0.11 10*3/uL — ABNORMAL HIGH (ref 0.00–0.07)
Basophils Absolute: 0 10*3/uL (ref 0.0–0.1)
Basophils Relative: 0 %
Eosinophils Absolute: 0 10*3/uL (ref 0.0–0.5)
Eosinophils Relative: 0 %
HCT: 37.2 % (ref 36.0–46.0)
Hemoglobin: 12.3 g/dL (ref 12.0–15.0)
Immature Granulocytes: 1 %
Lymphocytes Relative: 9 %
Lymphs Abs: 1.2 10*3/uL (ref 0.7–4.0)
MCH: 28.8 pg (ref 26.0–34.0)
MCHC: 33.1 g/dL (ref 30.0–36.0)
MCV: 87.1 fL (ref 80.0–100.0)
Monocytes Absolute: 0.4 10*3/uL (ref 0.1–1.0)
Monocytes Relative: 3 %
Neutro Abs: 11.3 10*3/uL — ABNORMAL HIGH (ref 1.7–7.7)
Neutrophils Relative %: 87 %
Platelet Count: 224 10*3/uL (ref 150–400)
RBC: 4.27 MIL/uL (ref 3.87–5.11)
RDW: 16.5 % — ABNORMAL HIGH (ref 11.5–15.5)
WBC Count: 13 10*3/uL — ABNORMAL HIGH (ref 4.0–10.5)
nRBC: 0 % (ref 0.0–0.2)

## 2020-07-28 MED ORDER — PALONOSETRON HCL INJECTION 0.25 MG/5ML
INTRAVENOUS | Status: AC
  Filled 2020-07-28: qty 5

## 2020-07-28 MED ORDER — SODIUM CHLORIDE 0.9 % IV SOLN
750.0000 mg | Freq: Once | INTRAVENOUS | Status: AC
  Administered 2020-07-28: 750 mg via INTRAVENOUS
  Filled 2020-07-28: qty 75

## 2020-07-28 MED ORDER — SODIUM CHLORIDE 0.9 % IV SOLN
150.0000 mg | Freq: Once | INTRAVENOUS | Status: AC
  Administered 2020-07-28: 150 mg via INTRAVENOUS
  Filled 2020-07-28: qty 150

## 2020-07-28 MED ORDER — PALONOSETRON HCL INJECTION 0.25 MG/5ML
0.2500 mg | Freq: Once | INTRAVENOUS | Status: AC
  Administered 2020-07-28: 0.25 mg via INTRAVENOUS

## 2020-07-28 MED ORDER — ACETAMINOPHEN 325 MG PO TABS
ORAL_TABLET | ORAL | Status: AC
  Filled 2020-07-28: qty 2

## 2020-07-28 MED ORDER — DIPHENHYDRAMINE HCL 25 MG PO CAPS
25.0000 mg | ORAL_CAPSULE | Freq: Once | ORAL | Status: AC
  Administered 2020-07-28: 25 mg via ORAL

## 2020-07-28 MED ORDER — SODIUM CHLORIDE 0.9% FLUSH
10.0000 mL | INTRAVENOUS | Status: DC | PRN
  Administered 2020-07-28: 10 mL
  Filled 2020-07-28: qty 10

## 2020-07-28 MED ORDER — SODIUM CHLORIDE 0.9 % IV SOLN
10.0000 mg | Freq: Once | INTRAVENOUS | Status: AC
  Administered 2020-07-28: 10 mg via INTRAVENOUS
  Filled 2020-07-28: qty 10

## 2020-07-28 MED ORDER — DIPHENHYDRAMINE HCL 25 MG PO CAPS
ORAL_CAPSULE | ORAL | Status: AC
  Filled 2020-07-28: qty 1

## 2020-07-28 MED ORDER — ACETAMINOPHEN 325 MG PO TABS
650.0000 mg | ORAL_TABLET | Freq: Once | ORAL | Status: AC
  Administered 2020-07-28: 650 mg via ORAL

## 2020-07-28 MED ORDER — SODIUM CHLORIDE 0.9 % IV SOLN
Freq: Once | INTRAVENOUS | Status: AC
  Filled 2020-07-28: qty 250

## 2020-07-28 MED ORDER — SODIUM CHLORIDE 0.9 % IV SOLN
75.0000 mg/m2 | Freq: Once | INTRAVENOUS | Status: AC
  Administered 2020-07-28: 200 mg via INTRAVENOUS
  Filled 2020-07-28: qty 20

## 2020-07-28 MED ORDER — HEPARIN SOD (PORK) LOCK FLUSH 100 UNIT/ML IV SOLN
500.0000 [IU] | Freq: Once | INTRAVENOUS | Status: AC | PRN
  Administered 2020-07-28: 500 [IU]
  Filled 2020-07-28: qty 5

## 2020-07-28 MED ORDER — TRASTUZUMAB-DKST CHEMO 150 MG IV SOLR
6.0000 mg/kg | Freq: Once | INTRAVENOUS | Status: AC
  Administered 2020-07-28: 840 mg via INTRAVENOUS
  Filled 2020-07-28: qty 40

## 2020-07-28 MED ORDER — LORAZEPAM 0.5 MG PO TABS: 0.5000 mg | ORAL_TABLET | Freq: Four times a day (QID) | ORAL | 0 refills | Status: DC | PRN

## 2020-07-28 MED FILL — LORAZEPAM 0.5 MG TABS: 0.5 | 7 days supply | Qty: 30 | Fill #0

## 2020-07-28 NOTE — Patient Instructions (Signed)

## 2020-07-28 NOTE — Progress Notes (Signed)
Reviewed pt vital signs with Wilber Bihari NP and pt ok to treat with HR 111

## 2020-07-28 NOTE — Patient Instructions (Signed)
[facility] Discharge Instructions for Patients Receiving Chemotherapy  Today you received the following chemotherapy agents Carboplatin, Taxotere, Ogivri,   To help prevent nausea and vomiting after your treatment, we encourage you to take your nausea medication as directed If you develop nausea and vomiting that is not controlled by your nausea medication, call the clinic.   BELOW ARE SYMPTOMS THAT SHOULD BE REPORTED IMMEDIATELY:  *FEVER GREATER THAN 100.5 F  *CHILLS WITH OR WITHOUT FEVER  NAUSEA AND VOMITING THAT IS NOT CONTROLLED WITH YOUR NAUSEA MEDICATION  *UNUSUAL SHORTNESS OF BREATH  *UNUSUAL BRUISING OR BLEEDING  TENDERNESS IN MOUTH AND THROAT WITH OR WITHOUT PRESENCE OF ULCERS  *URINARY PROBLEMS  *BOWEL PROBLEMS  UNUSUAL RASH Items with * indicate a potential emergency and should be followed up as soon as possible.  Feel free to call the clinic should you have any questions or concerns. The clinic phone number is [phone].  Please show the CHEMO ALERT CARD at check-in to the Emergency Department and triage nurse.   

## 2020-07-29 ENCOUNTER — Inpatient Hospital Stay: Payer: Medicaid Other

## 2020-07-29 ENCOUNTER — Encounter: Payer: Self-pay | Admitting: Licensed Clinical Social Worker

## 2020-07-29 VITALS — BP 125/80 | HR 79 | Temp 98.7°F | Resp 18

## 2020-07-29 DIAGNOSIS — Z5111 Encounter for antineoplastic chemotherapy: Secondary | ICD-10-CM | POA: Diagnosis not present

## 2020-07-29 DIAGNOSIS — Z170 Estrogen receptor positive status [ER+]: Secondary | ICD-10-CM

## 2020-07-29 LAB — CA 125: Cancer Antigen (CA) 125: 10.2 U/mL (ref 0.0–38.1)

## 2020-07-29 MED ORDER — PEGFILGRASTIM-CBQV 6 MG/0.6ML ~~LOC~~ SOSY
PREFILLED_SYRINGE | SUBCUTANEOUS | Status: AC
  Filled 2020-07-29: qty 0.6

## 2020-07-29 MED ORDER — PEGFILGRASTIM-CBQV 6 MG/0.6ML ~~LOC~~ SOSY
6.0000 mg | PREFILLED_SYRINGE | Freq: Once | SUBCUTANEOUS | Status: AC
  Administered 2020-07-29: 6 mg via SUBCUTANEOUS

## 2020-07-29 NOTE — Progress Notes (Signed)
[facility] CSW Progress Note  Clinical Education officer, museum received Administrator, arts from patient. CSW submitted along with supporting letter. Remember [employer] will notify patient directly if she is approved for assistance.    Christeen Douglas , LCSW

## 2020-07-29 NOTE — Patient Instructions (Signed)
Pegfilgrastim injection What is this medicine? PEGFILGRASTIM (PEG fil gra stim) is a long-acting granulocyte colony-stimulating factor that stimulates the growth of neutrophils, a type of white blood cell important in the body's fight against infection. It is used to reduce the incidence of fever and infection in patients with certain types of cancer who are receiving chemotherapy that affects the bone marrow, and to increase survival after being exposed to high doses of radiation. This medicine may be used for other purposes; ask your health care provider or pharmacist if you have questions. COMMON BRAND NAME(S): Fulphila, Neulasta, Nyvepria, UDENYCA, Ziextenzo What should I tell my health care provider before I take this medicine? They need to know if you have any of these conditions:  kidney disease  latex allergy  ongoing radiation therapy  sickle cell disease  skin reactions to acrylic adhesives (On-Body Injector only)  an unusual or allergic reaction to pegfilgrastim, filgrastim, other medicines, foods, dyes, or preservatives  pregnant or trying to get pregnant  breast-feeding How should I use this medicine? This medicine is for injection under the skin. If you get this medicine at home, you will be taught how to prepare and give the pre-filled syringe or how to use the On-body Injector. Refer to the patient Instructions for Use for detailed instructions. Use exactly as directed. Tell your healthcare provider immediately if you suspect that the On-body Injector may not have performed as intended or if you suspect the use of the On-body Injector resulted in a missed or partial dose. It is important that you put your used needles and syringes in a special sharps container. Do not put them in a trash can. If you do not have a sharps container, call your pharmacist or healthcare provider to get one. Talk to your pediatrician regarding the use of this medicine in children. While this drug  may be prescribed for selected conditions, precautions do apply. Overdosage: If you think you have taken too much of this medicine contact a poison control center or emergency room at once. NOTE: This medicine is only for you. Do not share this medicine with others. What if I miss a dose? It is important not to miss your dose. Call your doctor or health care professional if you miss your dose. If you miss a dose due to an On-body Injector failure or leakage, a new dose should be administered as soon as possible using a single prefilled syringe for manual use. What may interact with this medicine? Interactions have not been studied. This list may not describe all possible interactions. Give your health care provider a list of all the medicines, herbs, non-prescription drugs, or dietary supplements you use. Also tell them if you smoke, drink alcohol, or use illegal drugs. Some items may interact with your medicine. What should I watch for while using this medicine? Your condition will be monitored carefully while you are receiving this medicine. You may need blood work done while you are taking this medicine. Talk to your health care provider about your risk of cancer. You may be more at risk for certain types of cancer if you take this medicine. If you are going to need a MRI, CT scan, or other procedure, tell your doctor that you are using this medicine (On-Body Injector only). What side effects may I notice from receiving this medicine? Side effects that you should report to your doctor or health care professional as soon as possible:  allergic reactions (skin rash, itching or hives, swelling of   the face, lips, or tongue)  back pain  dizziness  fever  pain, redness, or irritation at site where injected  pinpoint red spots on the skin  red or dark-brown urine  shortness of breath or breathing problems  stomach or side pain, or pain at the shoulder  swelling  tiredness  trouble  passing urine or change in the amount of urine  unusual bruising or bleeding Side effects that usually do not require medical attention (report to your doctor or health care professional if they continue or are bothersome):  bone pain  muscle pain This list may not describe all possible side effects. Call your doctor for medical advice about side effects. You may report side effects to FDA at 1-800-FDA-1088. Where should I keep my medicine? Keep out of the reach of children. If you are using this medicine at home, you will be instructed on how to store it. Throw away any unused medicine after the expiration date on the label. NOTE: This sheet is a summary. It may not cover all possible information. If you have questions about this medicine, talk to your doctor, pharmacist, or health care provider.  2021 Elsevier/Gold Standard (2019-07-03 13:20:51)  

## 2020-07-30 ENCOUNTER — Ambulatory Visit: Payer: No Typology Code available for payment source

## 2020-08-01 ENCOUNTER — Inpatient Hospital Stay: Payer: Medicaid Other

## 2020-08-01 ENCOUNTER — Other Ambulatory Visit: Payer: Self-pay

## 2020-08-01 VITALS — BP 116/90 | HR 96 | Temp 98.9°F | Resp 17

## 2020-08-01 DIAGNOSIS — Z95828 Presence of other vascular implants and grafts: Secondary | ICD-10-CM

## 2020-08-01 DIAGNOSIS — Z5111 Encounter for antineoplastic chemotherapy: Secondary | ICD-10-CM | POA: Diagnosis not present

## 2020-08-01 MED ORDER — SODIUM CHLORIDE 0.9 % IV SOLN
INTRAVENOUS | Status: AC
  Filled 2020-08-01 (×2): qty 250

## 2020-08-01 NOTE — Progress Notes (Signed)
..  The following Assist/Replace Program for Ogivri from [employer] [facility] has been terminated due to Full Medicaid starting 06/25/2020.  Last DOS:07/07/2020.

## 2020-08-01 NOTE — Progress Notes (Signed)
..  The following Assist/Replace Program for Udenyca from [place] has been terminated due to Medicaid starting 06/25/2020.  Last DOS:07/08/2020

## 2020-08-01 NOTE — Progress Notes (Signed)
..  The following Assist/Replace Program for Perjeta from [employer] [facility] has been terminated due to Full Medicaid starting 06/25/2020.  Last DOS:06/16/2020

## 2020-08-01 NOTE — Patient Instructions (Signed)
Dehydration, Adult Dehydration is condition in which there is not enough water or other fluids in the body. This happens when a person loses more fluids than he or she takes in. Important body parts cannot work right without the right amount of fluids. Any loss of fluids from the body can cause dehydration. Dehydration can be mild, worse, or very bad. It should be treated right away to keep it from getting very bad. What are the causes? This condition may be caused by:  Conditions that cause loss of water or other fluids, such as: ? Watery poop (diarrhea). ? Vomiting. ? Sweating a lot. ? Peeing (urinating) a lot.  Not drinking enough fluids, especially when you: ? Are ill. ? Are doing things that take a lot of energy to do.  Other illnesses and conditions, such as fever or infection.  Certain medicines, such as medicines that take extra fluid out of the body (diuretics).  Lack of safe drinking water.  Not being able to get enough water and food. What increases the risk? The following factors may make you more likely to develop this condition:  Having a long-term (chronic) illness that has not been treated the right way, such as: ? Diabetes. ? Heart disease. ? Kidney disease.  Being 65 years of age or older.  Having a disability.  Living in a place that is high above the ground or sea (high in altitude). The thinner, dried air causes more fluid loss.  Doing exercises that put stress on your body for a long time. What are the signs or symptoms? Symptoms of dehydration depend on how bad it is. Mild or worse dehydration  Thirst.  Dry lips or dry mouth.  Feeling dizzy or light-headed, especially when you stand up from sitting.  Muscle cramps.  Your body making: ? Dark pee (urine). Pee may be the color of tea. ? Less pee than normal. ? Less tears than normal.  Headache. Very bad dehydration  Changes in skin. Skin may: ? Be cold to the touch (clammy). ? Be blotchy  or pale. ? Not go back to normal right after you lightly pinch it and let it go.  Little or no tears, pee, or sweat.  Changes in vital signs, such as: ? Fast breathing. ? Low blood pressure. ? Weak pulse. ? Pulse that is more than 100 beats a minute when you are sitting still.  Other changes, such as: ? Feeling very thirsty. ? Eyes that look hollow (sunken). ? Cold hands and feet. ? Being mixed up (confused). ? Being very tired (lethargic) or having trouble waking from sleep. ? Short-term weight loss. ? Loss of consciousness. How is this treated? Treatment for this condition depends on how bad it is. Treatment should start right away. Do not wait until your condition gets very bad. Very bad dehydration is an emergency. You will need to go to a hospital.  Mild or worse dehydration can be treated at home. You may be asked to: ? Drink more fluids. ? Drink an oral rehydration solution (ORS). This drink helps get the right amounts of fluids and salts and minerals in the blood (electrolytes).  Very bad dehydration can be treated: ? With fluids through an IV tube. ? By getting normal levels of salts and minerals in your blood. This is often done by giving salts and minerals through a tube. The tube is passed through your nose and into your stomach. ? By treating the root cause. Follow these instructions at   home: Oral rehydration solution If told by your doctor, drink an ORS:  Make an ORS. Use instructions on the package.  Start by drinking small amounts, about  cup (120 mL) every 5-10 minutes.  Slowly drink more until you have had the amount that your doctor said to have. Eating and drinking  Drink enough clear fluid to keep your pee pale yellow. If you were told to drink an ORS, finish the ORS first. Then, start slowly drinking other clear fluids. Drink fluids such as: ? Water. Do not drink only water. Doing that can make the salt (sodium) level in your body get too low. ? Water  from ice chips you suck on. ? Fruit juice that you have added water to (diluted). ? Low-calorie sports drinks.  Eat foods that have the right amounts of salts and minerals, such as: ? Bananas. ? Oranges. ? Potatoes. ? Tomatoes. ? Spinach.  Do not drink alcohol.  Avoid: ? Drinks that have a lot of sugar. These include:  High-calorie sports drinks.  Fruit juice that you did not add water to.  Soda.  Caffeine. ? Foods that are greasy or have a lot of fat or sugar.         General instructions  Take over-the-counter and prescription medicines only as told by your doctor.  Do not take salt tablets. Doing that can make the salt level in your body get too high.  Return to your normal activities as told by your doctor. Ask your doctor what activities are safe for you.  Keep all follow-up visits as told by your doctor. This is important. Contact a doctor if:  You have pain in your belly (abdomen) and the pain: ? Gets worse. ? Stays in one place.  You have a rash.  You have a stiff neck.  You get angry or annoyed (irritable) more easily than normal.  You are more tired or have a harder time waking than normal.  You feel: ? Weak or dizzy. ? Very thirsty. Get help right away if you have:  Any symptoms of very bad dehydration.  Symptoms of vomiting, such as: ? You cannot eat or drink without vomiting. ? Your vomiting gets worse or does not go away. ? Your vomit has blood or green stuff in it.  Symptoms that get worse with treatment.  A fever.  A very bad headache.  Problems with peeing or pooping (having a bowel movement), such as: ? Watery poop that gets worse or does not go away. ? Blood in your poop (stool). This may cause poop to look black and tarry. ? Not peeing in 6-8 hours. ? Peeing only a small amount of very dark pee in 6-8 hours.  Trouble breathing. These symptoms may be an emergency. Do not wait to see if the symptoms will go away. Get  medical help right away. Call your local emergency services (911 in the [place] Do not drive yourself to the hospital. Summary  Dehydration is a condition in which there is not enough water or other fluids in the body. This happens when a person loses more fluids than he or she takes in.  Treatment for this condition depends on how bad it is. Treatment should be started right away. Do not wait until your condition gets very bad.  Drink enough clear fluid to keep your pee pale yellow. If you were told to drink an oral rehydration solution (ORS), finish the ORS first. Then, start slowly drinking other clear fluids.    Take over-the-counter and prescription medicines only as told by your doctor.  Get help right away if you have any symptoms of very bad dehydration. This information is not intended to replace advice given to you by your health care provider. Make sure you discuss any questions you have with your health care provider. Document Revised: 01/22/2019 Document Reviewed: 01/22/2019 Elsevier Patient Education  2021 Elsevier Inc.  

## 2020-08-08 ENCOUNTER — Ambulatory Visit (HOSPITAL_COMMUNITY): Payer: Medicaid Other

## 2020-08-08 ENCOUNTER — Telehealth: Payer: Self-pay | Admitting: Hematology and Oncology

## 2020-08-08 NOTE — Telephone Encounter (Signed)
R/s appt per LC schedule change. Pt confirmed cancellation and new appt date and time.

## 2020-08-09 ENCOUNTER — Telehealth: Payer: Self-pay | Admitting: *Deleted

## 2020-08-09 NOTE — Telephone Encounter (Signed)
Called and rescheduled the patient's [place] scan. Appt rescheduled to 2/18 at 9 am; patient aware

## 2020-08-12 ENCOUNTER — Ambulatory Visit (HOSPITAL_COMMUNITY)
Admission: RE | Admit: 2020-08-12 | Discharge: 2020-08-12 | Disposition: A | Discharge status: Home / Self Care | Payer: Medicaid Other | Source: Ambulatory Visit | Attending: Gynecologic Oncology | Admitting: Gynecologic Oncology

## 2020-08-12 ENCOUNTER — Ambulatory Visit (HOSPITAL_COMMUNITY): Payer: Medicaid Other

## 2020-08-12 ENCOUNTER — Other Ambulatory Visit: Payer: Self-pay

## 2020-08-12 DIAGNOSIS — Z1501 Genetic susceptibility to malignant neoplasm of breast: Secondary | ICD-10-CM | POA: Insufficient documentation

## 2020-08-12 DIAGNOSIS — Z1509 Genetic susceptibility to other malignant neoplasm: Secondary | ICD-10-CM | POA: Diagnosis present

## 2020-08-15 ENCOUNTER — Telehealth: Payer: Self-pay

## 2020-08-15 MED FILL — DEXAMETHASONE 4 MG TABLET: 4 | 12 days supply | Qty: 30 | Fill #1

## 2020-08-15 MED FILL — PROCHLORPERAZINE 10 MG TAB: 10 | 7 days supply | Qty: 30 | Fill #1

## 2020-08-15 NOTE — Telephone Encounter (Signed)
Told Sheryl Mcmillan that her [place] was normal per Melissa Cross,NP. F/U with Dr. Denman George in June as scheduled.

## 2020-08-17 ENCOUNTER — Inpatient Hospital Stay (HOSPITAL_BASED_OUTPATIENT_CLINIC_OR_DEPARTMENT_OTHER): Payer: Medicaid Other | Admitting: Oncology

## 2020-08-17 ENCOUNTER — Other Ambulatory Visit: Payer: Self-pay

## 2020-08-17 ENCOUNTER — Inpatient Hospital Stay: Payer: Medicaid Other

## 2020-08-17 ENCOUNTER — Other Ambulatory Visit (HOSPITAL_COMMUNITY): Payer: Self-pay | Admitting: Oncology

## 2020-08-17 VITALS — BP 151/92 | HR 94 | Temp 97.5°F | Resp 18 | Ht 71.0 in | Wt 298.9 lb

## 2020-08-17 DIAGNOSIS — C50412 Malignant neoplasm of upper-outer quadrant of left female breast: Secondary | ICD-10-CM | POA: Diagnosis not present

## 2020-08-17 DIAGNOSIS — Z1509 Genetic susceptibility to other malignant neoplasm: Secondary | ICD-10-CM

## 2020-08-17 DIAGNOSIS — Z1501 Genetic susceptibility to malignant neoplasm of breast: Secondary | ICD-10-CM | POA: Diagnosis not present

## 2020-08-17 DIAGNOSIS — Z170 Estrogen receptor positive status [ER+]: Secondary | ICD-10-CM | POA: Diagnosis not present

## 2020-08-17 DIAGNOSIS — R112 Nausea with vomiting, unspecified: Secondary | ICD-10-CM

## 2020-08-17 DIAGNOSIS — Z1379 Encounter for other screening for genetic and chromosomal anomalies: Secondary | ICD-10-CM | POA: Diagnosis not present

## 2020-08-17 DIAGNOSIS — Z95828 Presence of other vascular implants and grafts: Secondary | ICD-10-CM

## 2020-08-17 DIAGNOSIS — Z5111 Encounter for antineoplastic chemotherapy: Secondary | ICD-10-CM | POA: Diagnosis not present

## 2020-08-17 LAB — CMP (CANCER CENTER ONLY)
ALT: 26 U/L (ref 0–44)
AST: 16 U/L (ref 15–41)
Albumin: 3.8 g/dL (ref 3.5–5.0)
Alkaline Phosphatase: 70 U/L (ref 38–126)
Anion gap: 10 (ref 5–15)
BUN: 10 mg/dL (ref 6–20)
CO2: 22 mmol/L (ref 22–32)
Calcium: 9.1 mg/dL (ref 8.9–10.3)
Chloride: 108 mmol/L (ref 98–111)
Creatinine: 0.92 mg/dL (ref 0.44–1.00)
GFR, Estimated: >60 mL/min (ref >60)
Glucose, Bld: 174 mg/dL — ABNORMAL HIGH (ref 70–99)
Potassium: 4.6 mmol/L (ref 3.5–5.1)
Sodium: 140 mmol/L (ref 135–145)
Total Bilirubin: <0.2 mg/dL — ABNORMAL LOW (ref 0.3–1.2)
Total Protein: 6.9 g/dL (ref 6.5–8.1)

## 2020-08-17 LAB — CBC WITH DIFFERENTIAL (CANCER CENTER ONLY)
Abs Immature Granulocytes: 0.05 10*3/uL (ref 0.00–0.07)
Basophils Absolute: 0 10*3/uL (ref 0.0–0.1)
Basophils Relative: 0 %
Eosinophils Absolute: 0 10*3/uL (ref 0.0–0.5)
Eosinophils Relative: 0 %
HCT: 38.6 % (ref 36.0–46.0)
Hemoglobin: 12.5 g/dL (ref 12.0–15.0)
Immature Granulocytes: 1 %
Lymphocytes Relative: 22 %
Lymphs Abs: 2.1 10*3/uL (ref 0.7–4.0)
MCH: 29.2 pg (ref 26.0–34.0)
MCHC: 32.4 g/dL (ref 30.0–36.0)
MCV: 90.2 fL (ref 80.0–100.0)
Monocytes Absolute: 0.3 10*3/uL (ref 0.1–1.0)
Monocytes Relative: 3 %
Neutro Abs: 7.1 10*3/uL (ref 1.7–7.7)
Neutrophils Relative %: 74 %
Platelet Count: 297 10*3/uL (ref 150–400)
RBC: 4.28 MIL/uL (ref 3.87–5.11)
RDW: 17.9 % — ABNORMAL HIGH (ref 11.5–15.5)
WBC Count: 9.6 10*3/uL (ref 4.0–10.5)
nRBC: 0 % (ref 0.0–0.2)

## 2020-08-17 MED ORDER — LORAZEPAM 0.5 MG PO TABS: 0.5000 mg | ORAL_TABLET | Freq: Four times a day (QID) | ORAL | 0 refills | Status: DC | PRN

## 2020-08-17 MED ORDER — SODIUM CHLORIDE 0.9% FLUSH
10.0000 mL | Freq: Once | INTRAVENOUS | Status: DC
  Filled 2020-08-17: qty 10

## 2020-08-17 MED ORDER — HEPARIN SOD (PORK) LOCK FLUSH 100 UNIT/ML IV SOLN
500.0000 [IU] | Freq: Once | INTRAVENOUS | Status: DC
  Filled 2020-08-17: qty 5

## 2020-08-17 MED ORDER — HYDROCORTISONE ACE-PRAMOXINE 1-1 % EX CREA: 1.0000 "application " | TOPICAL_CREAM | Freq: Two times a day (BID) | CUTANEOUS | 0 refills | Status: DC

## 2020-08-17 NOTE — Progress Notes (Signed)
[name]  [phone] [phone]     ID: [name] DOB: 1975-07-21  MR#: [id number]  JJK#:[id number]  Patient Care Team: Patient, No Pcp Per as PCP - General (General Practice) [name], RN as Oncology Nurse Navigator [name], RN as Oncology Nurse Navigator [name], MD as Consulting Physician (General Surgery) [name], [name], MD as Consulting Physician (Oncology) [name], MD as Consulting Physician (Radiation Oncology) [name], MD OTHER MD:  CHIEF COMPLAINT: estrogen receptor negative, Her2 positive breast cancer  CURRENT TREATMENT: Neoadjuvant chemotherapy   INTERVAL HISTORY: Sheryl Mcmillan returns today for follow up of her estrogen receptor negative, Her2 positive breast cancer accompanied by her daughter.  Sheryl Mcmillan started neoadjuvant chemotherapy on 06/16/2020.  She is receiving Docetaxel, Carboplatin, Trastuzumab, pertuzumab every 21 days with udenyca on day 3 for growth factor support.  Today is cycle 4 day 1.    Her most recent echo was completed on 06/09/2020 and showed an EF of 65-70%.  Since her last visit, she underwent pelvic ultrasound on 08/12/2020. This was normal.   REVIEW OF SYSTEMS: [name] is having problems with hemorrhoids.  This is not a new issue for her but it is a little bit worse.  Nothing we have tried so far has made much difference.  She has requested anal ease and I will be glad to try to get that for her.  Aside from that a detailed review of systems today was stable    COVID 19 VACCINATION STATUS: Status post vaccine x2, no booster as of February 2022   HISTORY OF CURRENT ILLNESS: From the original intake note:   Sheryl Mcmillan presented with a palpable lump in the outer left breast, which she initially felt approximately 6 months prior. She waited to seek medical attention because she had no insurance.  Eventually she enrolled in the [facility] program and underwent  bilateral diagnostic mammography with tomography and left breast ultrasonography at [facility] on 05/10/2020 showing: breast density category B; vaguely palpable 2.5 cm mass in left breast at 1:30; no pathologic left axillary lymphadenopathy or evidence of right breast malignancy.  Accordingly on 05/18/2020 she proceeded to biopsy of the left breast area in question. The pathology from this procedure ([id number]) showed: invasive ductal carcinoma, grade 3. Prognostic indicators significant for: estrogen receptor, 40% positive with weak staining intensity and progesterone receptor, 0% negative. Proliferation marker Ki67 at 50%. HER2 positive by immunohistochemistry (3+).  The patient's subsequent history is as detailed below.   PAST MEDICAL HISTORY: Past Medical History:  Diagnosis Date  . Arthritis   . Breast cancer ([place])   . Family history of non-Hodgkin's lymphoma 06/01/2020  . Hemorrhoids   . Migraines   . Obesity   . PCOS (polycystic ovarian syndrome)     PAST SURGICAL HISTORY: Past Surgical History:  Procedure Laterality Date  . COSMETIC SURGERY    . DILATION AND CURETTAGE OF UTERUS    . HEMORRHOID SURGERY    . IR IMAGING GUIDED PORT INSERTION  06/15/2020    FAMILY HISTORY: Family History  Problem Relation Age of Onset  . Hypertension Mother   . Diabetes Mother   . Cancer Mother 8       unknown primary  . Other Mother        brain tumor; dx 49s  . Non-Hodgkin's lymphoma Brother 37  . Other Maternal Uncle 52       brain tumor  . Breast cancer Other  MGM's niece; dx late 77s  . Pancreatic cancer Neg Hx   . Colon cancer Neg Hx   . Endometrial cancer Neg Hx   . Prostate cancer Neg Hx   . Ovarian cancer Neg Hx    She has no information on her father. Her mother died at age 69 from stage IV cancer. The primary cancer was unknown, but it was not breast.  Sheryl Mcmillan has two half brothers (through her mother), one of which was diagnosed with non-Hodgkin's lymphoma.  There is no family history of breast, ovarian, or prostate cancer to her knowledge. // Patient is BRCA1 positive. Daughter tested negative.   GYNECOLOGIC HISTORY:  No LMP recorded. Menarche: 45 years old Age at first live birth: 45 years old [place] P 1 LMP 05/02/2020 Contraceptive: never used HRT n/a  Hysterectomy? no BSO? 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 She is single. Her significant other Sheryl Mcmillan is a Freight forwarder. She lives at home with Sheryl Mcmillan, her daughter Sheryl Mcmillan,  [name]'s daughter Sheryl Mcmillan and her 21-monthold daughter Sheryl Mcmillan Daughter [name] age 44 is a bChief Operating Officerat [employer]    ADVANCED DIRECTIVES: She intends to name her daughter JFredrich Birksas her HCPOA.  At the 06/01/2020 visit the patient was given the appropriate documents to complete and notarized at her discretion..[name]  HEALTH MAINTENANCE: Social History   Tobacco Use  . Smoking status: Current Every Day Smoker    Packs/day: 0.50    Years: 10.00    Pack years: 5.00    Types: Cigarettes  . Smokeless tobacco: Never Used  Vaping Use  . Vaping Use: Never used  Substance Use Topics  . Alcohol use: Yes    Comment: occasionally  . Drug use: Yes    Types: Marijuana     Colonoscopy: 2018  PAP: 2018  Bone density: n/a (age)   Allergies  Allergen Reactions  . Crest Tc-Baking Soda [Sodium Fluoride] Hives    Baking Soda    Current Outpatient Medications  Medication Sig Dispense Refill  . dexamethasone (DECADRON) 4 MG tablet Take 2 tablets (8 mg total) by mouth 2 (two) times daily. Start the day before Taxotere. Then take daily x 3 days after chemotherapy. 30 tablet 1  . dibucaine (NUPERCAINAL) 1 % OINT Place 1 application rectally as needed for hemorrhoids. 28 g 1  . diphenoxylate-atropine (LOMOTIL) 2.5-0.025 MG tablet 1 to 2 tablets PO QID prn diarrhea 30 tablet 2  .  fluconazole (DIFLUCAN) 200 MG tablet Take 1 tablet (200 mg total) by mouth daily. 7 tablet 0  . hydrocortisone (ANUSOL-HC) 2.5 % rectal cream Place 1 application rectally 2 (two) times daily. 30 g 0  . lidocaine-prilocaine (EMLA) cream Apply to affected area once 30 g 3  . LORazepam (ATIVAN) 0.5 MG tablet Place 1 tablet (0.5 mg total) under the tongue every 6 (six) hours as needed for anxiety. 30 tablet 0  . pramoxine-hydrocortisone (PROCTOCREAM-HC) 1-1 % rectal cream Place 1 application rectally 2 (two) times daily. 30 g 0  . prochlorperazine (COMPAZINE) 10 MG tablet Take 1 tablet (10 mg total) by mouth every 6 (six) hours as needed (Nausea or vomiting). 30 tablet 1   No current facility-administered medications for this visit.   Facility-Administered Medications Ordered in Other Visits  Medication Dose Route Frequency Provider Last Rate Last Admin  . sodium chloride flush (NS) 0.9 % injection 10  mL  10 mL Intravenous PRN Gardenia Phlegm, NP        OBJECTIVE: White woman who appears stated age 40:   08/17/20 1409  BP: (!) 151/92  Pulse: 94  Resp: 18  Temp: (!) 97.5 F (36.4 C)  SpO2: 100%   Body mass index is 41.69 kg/m. Filed Weights   08/17/20 1409  Weight: 298 lb 14.4 oz (135.6 kg)    Sclerae unicteric, EOMs intact Wearing a mask No cervical or supraclavicular adenopathy Lungs no rales or rhonchi Heart regular rate and rhythm Abd soft, obese, nontender, positive bowel sounds MSK no focal spinal tenderness, no upper extremity lymphedema Neuro: nonfocal, well oriented, appropriate affect Breasts: I no longer palpate a mass in the left breast.  There are no skin or nipple changes of concern.  The left axilla is benign.   LAB RESULTS:  CMP     Component Value Date/Time   NA 140 08/17/2020 1237   K 4.6 08/17/2020 1237   CL 108 08/17/2020 1237   CO2 22 08/17/2020 1237   GLUCOSE 174 (H) 08/17/2020 1237   BUN 10 08/17/2020 1237   CREATININE 0.92  08/17/2020 1237   CALCIUM 9.1 08/17/2020 1237   PROT 6.9 08/17/2020 1237   ALBUMIN 3.8 08/17/2020 1237   AST 16 08/17/2020 1237   ALT 26 08/17/2020 1237   ALKPHOS 70 08/17/2020 1237   BILITOT <0.2 (L) 08/17/2020 1237   GFRNONAA >60 08/17/2020 1237   GFRAA >60 06/06/2019 1205    No results found for: TOTALPROTELP, ALBUMINELP, A1GS, A2GS, BETS, BETA2SER, GAMS, MSPIKE, SPEI  Lab Results  Component Value Date   WBC 9.6 08/17/2020   NEUTROABS 7.1 08/17/2020   HGB 12.5 08/17/2020   HCT 38.6 08/17/2020   MCV 90.2 08/17/2020   PLT 297 08/17/2020    No results found for: LABCA2  No components found for: VWUJWJ191  No results for input(s): INR in the last 168 hours.  No results found for: LABCA2  No results found for: CAN199  Lab Results  Component Value Date   CAN125 10.2 07/28/2020    No results found for: YNW295  No results found for: CA2729  No components found for: HGQUANT  No results found for: CEA1 / No results found for: CEA1   No results found for: AFPTUMOR  No results found for: CHROMOGRNA  No results found for: KPAFRELGTCHN, LAMBDASER, KAPLAMBRATIO (kappa/lambda light chains)  No results found for: HGBA, HGBA2QUANT, HGBFQUANT, HGBSQUAN (Hemoglobinopathy evaluation)   No results found for: LDH  No results found for: IRON, TIBC, IRONPCTSAT (Iron and TIBC)  No results found for: FERRITIN  Urinalysis    Component Value Date/Time   COLORURINE YELLOW 06/06/2019 [place] 06/06/2019 1205   LABSPEC >1.030 (H) 06/06/2019 1205   PHURINE 5.5 06/06/2019 1205   GLUCOSEU NEGATIVE 06/06/2019 1205   HGBUR TRACE (A) 06/06/2019 1205   BILIRUBINUR NEGATIVE 06/06/2019 1205   KETONESUR NEGATIVE 06/06/2019 1205   PROTEINUR NEGATIVE 06/06/2019 1205   UROBILINOGEN 1.0 05/09/2011 1805   NITRITE NEGATIVE 06/06/2019 1205   LEUKOCYTESUR NEGATIVE 06/06/2019 1205    STUDIES: US PELVIC COMPLETE WITH TRANSVAGINAL  Result Date: 08/12/2020 CLINICAL  DATA:  BRCA 1 deleterious mutation, breast cancer on chemotherapy and radiation therapy assessment for occult ovarian cancer; LMP 07/02/2020 EXAM: TRANSABDOMINAL AND TRANSVAGINAL ULTRASOUND OF PELVIS TECHNIQUE: Both transabdominal and transvaginal ultrasound examinations of the pelvis were performed. Transabdominal technique was performed for global imaging of the pelvis including uterus, ovaries, adnexal  regions, and pelvic cul-de-sac. It was necessary to proceed with endovaginal exam following the transabdominal exam to visualize the uterus, endometrium, and ovaries. COMPARISON:  05/09/2011 FINDINGS: Uterus Measurements: 7.8 x 3.3 x 4.3 cm = volume: 57 mL. Normal morphology without mass Endometrium Thickness: 7 mm.  No endometrial fluid or focal abnormality Right ovary Measurements: 4.0 x 2.9 x 2.4 cm = volume: 14.2 mL. Normal morphology without mass Left ovary Measurements: 2.9 x 2.8 x 4.0 cm = volume: 16.9 mL. Normal morphology without mass Other findings No free pelvic fluid.  No adnexal masses. IMPRESSION: Normal exam. Electronically Signed   By: Lavonia Dana M.D.   On: 08/12/2020 10:38     ELIGIBLE FOR AVAILABLE RESEARCH PROTOCOL: AET  ASSESSMENT: 45 y.o. [place] woman status post left breast upper outer quadrant biopsy 05/18/2020 for a clinical T2N0, stage IIA invasive ductal carcinoma, grade 3, estrogen receptor weakly positive, progesterone receptor negative, with an MIB-1 of 50%, and HER-2 positive by immunohistochemistry  (1) genetics testing 06/16/2020 shows a pathogenic mutation in BRCA1 at c.2475del (p.Asp825Glufs*21)  (a)  Variant of uncertain significance in MSH3 at c.2724A>G (Silent).  (b) no additional deleterious mutations through the Multi-Cancer Panel offered by [facility] including AIP, ALK, APC, ATM, AXIN2,BAP1,  BARD1, BLM, BMPR1A, BRCA1, BRCA2, BRIP1, CASR, CDC73, CDH1, CDK4, CDKN1B, CDKN1C, CDKN2A (p14ARF), CDKN2A (p16INK4a), CEBPA, CHEK2, CTNNA1, DICER1, DIS3L2, EGFR (c.2369C>T,  p.Thr790Met variant only), EPCAM (Deletion/duplication testing only), FH, FLCN, GATA2, GPC3, GREM1 (Promoter region deletion/duplication testing only), HOXB13 (c.251G>A, p.Gly84Glu), HRAS, KIT, MAX, MEN1, MET, MITF (c.952G>A, p.Glu318Lys variant only), MLH1, MSH2, MSH3, MSH6, MUTYH, NBN, NF1, NF2, NTHL1, PALB2, PDGFRA, PHOX2B, PMS2, POLD1, POLE, POT1, PRKAR1A, PTCH1, PTEN, RAD50, RAD51C, RAD51D, RB1, RECQL4, RET, RNF43, RUNX1, SDHAF2, SDHA (sequence changes only), SDHB, SDHC, SDHD, SMAD4, SMARCA4, SMARCB1, SMARCE1, STK11, SUFU, TERC, TERT, TMEM127, TP53, TSC1, TSC2, VHL, WRN and WT1.   (2) neoadjuvant chemotherapy to consist of carboplatin and docetaxel together with trastuzumab and Pertuzumab every 3 weeks x 6, starting 06/16/2020   (a) pertuzumab omitted after cycle 1  (3) trastuzumab to be continued to complete 1 year  (a) echo 06/09/2020 shows EF in the 60-65% range  (4) definitive surgery to follow  (5) adjuvant radiation  (6) antiestrogens   PLAN:  Alaisa is tolerating her treatment remarkably well.  She continues to have significant hemorrhoid issues.  We have been trying to help her with that without much success so far.  We will try "anal ease" which she says helps.--Unfortunately that is over-the-counter and I cannot prescribe it for her.  She will be treated again on March 17 and April 5.  I will see her both those times.  After that she will have a repeat MRI and proceed to surgery  She knows to call for any other issue that may develop before the next visit  Total encounter time 25 minutes.   [name]. June Rode, MD 08/17/20 2:35 PM Medical Oncology and Hematology [facility] [place], [place] [address] Tel. [phone]    Fax. [phone]   I, Wilburn Mylar, am acting as scribe for Dr. [name]. Laurene Melendrez.  I, Lurline Del MD, have reviewed the above documentation for accuracy and completeness, and I agree with the above.    *Total  Encounter Time as defined by the Centers for Medicare and Medicaid Services includes, in addition to the face-to-face time of a patient visit (documented in the note above) non-face-to-face time: obtaining and reviewing outside history, ordering and  reviewing medications, tests or procedures, care coordination (communications with other health care professionals or caregivers) and documentation in the medical record.

## 2020-08-18 ENCOUNTER — Other Ambulatory Visit: Payer: No Typology Code available for payment source

## 2020-08-18 ENCOUNTER — Ambulatory Visit: Payer: No Typology Code available for payment source | Admitting: Oncology

## 2020-08-18 ENCOUNTER — Other Ambulatory Visit: Payer: Self-pay

## 2020-08-18 ENCOUNTER — Inpatient Hospital Stay: Payer: Medicaid Other

## 2020-08-18 ENCOUNTER — Ambulatory Visit: Payer: No Typology Code available for payment source

## 2020-08-18 ENCOUNTER — Telehealth: Payer: Self-pay

## 2020-08-18 VITALS — BP 141/88 | HR 100 | Temp 98.7°F | Resp 17

## 2020-08-18 DIAGNOSIS — C50412 Malignant neoplasm of upper-outer quadrant of left female breast: Secondary | ICD-10-CM

## 2020-08-18 DIAGNOSIS — Z170 Estrogen receptor positive status [ER+]: Secondary | ICD-10-CM

## 2020-08-18 DIAGNOSIS — Z5111 Encounter for antineoplastic chemotherapy: Secondary | ICD-10-CM | POA: Diagnosis not present

## 2020-08-18 MED ORDER — SODIUM CHLORIDE 0.9 % IV SOLN
750.0000 mg | Freq: Once | INTRAVENOUS | Status: AC
  Administered 2020-08-18: 750 mg via INTRAVENOUS
  Filled 2020-08-18: qty 75

## 2020-08-18 MED ORDER — DIPHENHYDRAMINE HCL 25 MG PO CAPS
ORAL_CAPSULE | ORAL | Status: AC
  Filled 2020-08-18: qty 1

## 2020-08-18 MED ORDER — PALONOSETRON HCL INJECTION 0.25 MG/5ML
INTRAVENOUS | Status: AC
  Filled 2020-08-18: qty 5

## 2020-08-18 MED ORDER — ACETAMINOPHEN 325 MG PO TABS
650.0000 mg | ORAL_TABLET | Freq: Once | ORAL | Status: AC
  Administered 2020-08-18: 650 mg via ORAL

## 2020-08-18 MED ORDER — SODIUM CHLORIDE 0.9 % IV SOLN
10.0000 mg | Freq: Once | INTRAVENOUS | Status: AC
  Administered 2020-08-18: 10 mg via INTRAVENOUS
  Filled 2020-08-18: qty 10

## 2020-08-18 MED ORDER — SODIUM CHLORIDE 0.9 % IV SOLN
150.0000 mg | Freq: Once | INTRAVENOUS | Status: AC
  Administered 2020-08-18: 150 mg via INTRAVENOUS
  Filled 2020-08-18: qty 150

## 2020-08-18 MED ORDER — HEPARIN SOD (PORK) LOCK FLUSH 100 UNIT/ML IV SOLN
500.0000 [IU] | Freq: Once | INTRAVENOUS | Status: AC | PRN
  Administered 2020-08-18: 500 [IU]
  Filled 2020-08-18: qty 5

## 2020-08-18 MED ORDER — PALONOSETRON HCL INJECTION 0.25 MG/5ML
0.2500 mg | Freq: Once | INTRAVENOUS | Status: AC
Start: 2020-08-18 — End: 2020-08-18
  Administered 2020-08-18: 0.25 mg via INTRAVENOUS

## 2020-08-18 MED ORDER — TRASTUZUMAB-DKST CHEMO 150 MG IV SOLR
6.0000 mg/kg | Freq: Once | INTRAVENOUS | Status: AC
  Administered 2020-08-18: 840 mg via INTRAVENOUS
  Filled 2020-08-18: qty 40

## 2020-08-18 MED ORDER — SODIUM CHLORIDE 0.9 % IV SOLN
75.0000 mg/m2 | Freq: Once | INTRAVENOUS | Status: AC
  Administered 2020-08-18: 200 mg via INTRAVENOUS
  Filled 2020-08-18: qty 20

## 2020-08-18 MED ORDER — SODIUM CHLORIDE 0.9% FLUSH
10.0000 mL | INTRAVENOUS | Status: DC | PRN
  Administered 2020-08-18: 10 mL
  Filled 2020-08-18: qty 10

## 2020-08-18 MED ORDER — DIPHENHYDRAMINE HCL 25 MG PO CAPS
25.0000 mg | ORAL_CAPSULE | Freq: Once | ORAL | Status: AC
  Administered 2020-08-18: 25 mg via ORAL

## 2020-08-18 MED ORDER — ACETAMINOPHEN 325 MG PO TABS
ORAL_TABLET | ORAL | Status: AC
  Filled 2020-08-18: qty 2

## 2020-08-18 MED ORDER — SODIUM CHLORIDE 0.9 % IV SOLN
Freq: Once | INTRAVENOUS | Status: AC
  Filled 2020-08-18: qty 250

## 2020-08-18 MED FILL — LORAZEPAM 0.5 MG TABS: 0.5 | 7 days supply | Qty: 30 | Fill #0

## 2020-08-18 MED FILL — HYDROCORT-PRAMOXINE (PERIAN: 2.5-1 | 15 days supply | Qty: 30 | Fill #0

## 2020-08-18 NOTE — Telephone Encounter (Signed)
Patient went to pharmacy to pick up Rx for Lorazepam 0.5mg  tablet that was sent 08/17/2020.    With MD approval, verbal orders called into pharmacy.  See Meds & Orders.  Pt and pharmacy aware.

## 2020-08-18 NOTE — Patient Instructions (Signed)
[facility] Discharge Instructions for Patients Receiving Chemotherapy  Today you received the following chemotherapy agents Carboplatin, Taxotere, Ogivri,   To help prevent nausea and vomiting after your treatment, we encourage you to take your nausea medication as directed If you develop nausea and vomiting that is not controlled by your nausea medication, call the clinic.   BELOW ARE SYMPTOMS THAT SHOULD BE REPORTED IMMEDIATELY:  *FEVER GREATER THAN 100.5 F  *CHILLS WITH OR WITHOUT FEVER  NAUSEA AND VOMITING THAT IS NOT CONTROLLED WITH YOUR NAUSEA MEDICATION  *UNUSUAL SHORTNESS OF BREATH  *UNUSUAL BRUISING OR BLEEDING  TENDERNESS IN MOUTH AND THROAT WITH OR WITHOUT PRESENCE OF ULCERS  *URINARY PROBLEMS  *BOWEL PROBLEMS  UNUSUAL RASH Items with * indicate a potential emergency and should be followed up as soon as possible.  Feel free to call the clinic should you have any questions or concerns. The clinic phone number is [phone].  Please show the CHEMO ALERT CARD at check-in to the Emergency Department and triage nurse.   

## 2020-08-18 NOTE — Progress Notes (Signed)
RN reviewed request for additional IVF's on 2/28 with MD.   MD recommendations for IVF's over 2 hours.  Orders placed.

## 2020-08-18 NOTE — Progress Notes (Signed)
Orders placed for IVF's per MD recommendations.

## 2020-08-19 ENCOUNTER — Inpatient Hospital Stay: Payer: Medicaid Other

## 2020-08-19 ENCOUNTER — Other Ambulatory Visit: Payer: Self-pay

## 2020-08-19 ENCOUNTER — Encounter: Payer: Self-pay | Admitting: *Deleted

## 2020-08-19 VITALS — BP 126/84 | HR 79 | Resp 18

## 2020-08-19 DIAGNOSIS — Z5111 Encounter for antineoplastic chemotherapy: Secondary | ICD-10-CM | POA: Diagnosis not present

## 2020-08-19 DIAGNOSIS — Z170 Estrogen receptor positive status [ER+]: Secondary | ICD-10-CM

## 2020-08-19 DIAGNOSIS — C50412 Malignant neoplasm of upper-outer quadrant of left female breast: Secondary | ICD-10-CM

## 2020-08-19 MED ORDER — PEGFILGRASTIM-CBQV 6 MG/0.6ML ~~LOC~~ SOSY
6.0000 mg | PREFILLED_SYRINGE | Freq: Once | SUBCUTANEOUS | Status: AC
  Administered 2020-08-19: 6 mg via SUBCUTANEOUS

## 2020-08-19 NOTE — Patient Instructions (Signed)
Pegfilgrastim injection What is this medicine? PEGFILGRASTIM (PEG fil gra stim) is a long-acting granulocyte colony-stimulating factor that stimulates the growth of neutrophils, a type of white blood cell important in the body's fight against infection. It is used to reduce the incidence of fever and infection in patients with certain types of cancer who are receiving chemotherapy that affects the bone marrow, and to increase survival after being exposed to high doses of radiation. This medicine may be used for other purposes; ask your health care provider or pharmacist if you have questions. COMMON BRAND NAME(S): Fulphila, Neulasta, Nyvepria, UDENYCA, Ziextenzo What should I tell my health care provider before I take this medicine? They need to know if you have any of these conditions:  kidney disease  latex allergy  ongoing radiation therapy  sickle cell disease  skin reactions to acrylic adhesives (On-Body Injector only)  an unusual or allergic reaction to pegfilgrastim, filgrastim, other medicines, foods, dyes, or preservatives  pregnant or trying to get pregnant  breast-feeding How should I use this medicine? This medicine is for injection under the skin. If you get this medicine at home, you will be taught how to prepare and give the pre-filled syringe or how to use the On-body Injector. Refer to the patient Instructions for Use for detailed instructions. Use exactly as directed. Tell your healthcare provider immediately if you suspect that the On-body Injector may not have performed as intended or if you suspect the use of the On-body Injector resulted in a missed or partial dose. It is important that you put your used needles and syringes in a special sharps container. Do not put them in a trash can. If you do not have a sharps container, call your pharmacist or healthcare provider to get one. Talk to your pediatrician regarding the use of this medicine in children. While this drug  may be prescribed for selected conditions, precautions do apply. Overdosage: If you think you have taken too much of this medicine contact a poison control center or emergency room at once. NOTE: This medicine is only for you. Do not share this medicine with others. What if I miss a dose? It is important not to miss your dose. Call your doctor or health care professional if you miss your dose. If you miss a dose due to an On-body Injector failure or leakage, a new dose should be administered as soon as possible using a single prefilled syringe for manual use. What may interact with this medicine? Interactions have not been studied. This list may not describe all possible interactions. Give your health care provider a list of all the medicines, herbs, non-prescription drugs, or dietary supplements you use. Also tell them if you smoke, drink alcohol, or use illegal drugs. Some items may interact with your medicine. What should I watch for while using this medicine? Your condition will be monitored carefully while you are receiving this medicine. You may need blood work done while you are taking this medicine. Talk to your health care provider about your risk of cancer. You may be more at risk for certain types of cancer if you take this medicine. If you are going to need a MRI, CT scan, or other procedure, tell your doctor that you are using this medicine (On-Body Injector only). What side effects may I notice from receiving this medicine? Side effects that you should report to your doctor or health care professional as soon as possible:  allergic reactions (skin rash, itching or hives, swelling of   the face, lips, or tongue)  back pain  dizziness  fever  pain, redness, or irritation at site where injected  pinpoint red spots on the skin  red or dark-brown urine  shortness of breath or breathing problems  stomach or side pain, or pain at the shoulder  swelling  tiredness  trouble  passing urine or change in the amount of urine  unusual bruising or bleeding Side effects that usually do not require medical attention (report to your doctor or health care professional if they continue or are bothersome):  bone pain  muscle pain This list may not describe all possible side effects. Call your doctor for medical advice about side effects. You may report side effects to FDA at 1-800-FDA-1088. Where should I keep my medicine? Keep out of the reach of children. If you are using this medicine at home, you will be instructed on how to store it. Throw away any unused medicine after the expiration date on the label. NOTE: This sheet is a summary. It may not cover all possible information. If you have questions about this medicine, talk to your doctor, pharmacist, or health care provider.  2021 Elsevier/Gold Standard (2019-07-03 13:20:51)  

## 2020-08-20 ENCOUNTER — Ambulatory Visit: Payer: No Typology Code available for payment source

## 2020-08-22 ENCOUNTER — Other Ambulatory Visit: Payer: Self-pay

## 2020-08-22 ENCOUNTER — Inpatient Hospital Stay: Payer: Medicaid Other

## 2020-08-22 DIAGNOSIS — Z5111 Encounter for antineoplastic chemotherapy: Secondary | ICD-10-CM | POA: Diagnosis not present

## 2020-08-22 DIAGNOSIS — Z170 Estrogen receptor positive status [ER+]: Secondary | ICD-10-CM

## 2020-08-22 MED ORDER — ONDANSETRON HCL 4 MG/2ML IJ SOLN: 8.0000 mg | Freq: Once | INTRAMUSCULAR | Status: DC

## 2020-08-22 MED ORDER — SODIUM CHLORIDE 0.9 % IV SOLN
INTRAVENOUS | Status: AC
  Filled 2020-08-22 (×2): qty 250

## 2020-08-22 MED ORDER — ONDANSETRON HCL 4 MG/2ML IJ SOLN
INTRAMUSCULAR | Status: AC
  Filled 2020-08-22: qty 4

## 2020-08-22 MED ORDER — ONDANSETRON HCL 4 MG/2ML IJ SOLN
8.0000 mg | Freq: Once | INTRAMUSCULAR | Status: AC
  Administered 2020-08-22: 8 mg via INTRAVENOUS

## 2020-08-22 NOTE — Patient Instructions (Signed)
Dehydration, Adult Dehydration is condition in which there is not enough water or other fluids in the body. This happens when a person loses more fluids than he or she takes in. Important body parts cannot work right without the right amount of fluids. Any loss of fluids from the body can cause dehydration. Dehydration can be mild, worse, or very bad. It should be treated right away to keep it from getting very bad. What are the causes? This condition may be caused by:  Conditions that cause loss of water or other fluids, such as: ? Watery poop (diarrhea). ? Vomiting. ? Sweating a lot. ? Peeing (urinating) a lot.  Not drinking enough fluids, especially when you: ? Are ill. ? Are doing things that take a lot of energy to do.  Other illnesses and conditions, such as fever or infection.  Certain medicines, such as medicines that take extra fluid out of the body (diuretics).  Lack of safe drinking water.  Not being able to get enough water and food. What increases the risk? The following factors may make you more likely to develop this condition:  Having a long-term (chronic) illness that has not been treated the right way, such as: ? Diabetes. ? Heart disease. ? Kidney disease.  Being 65 years of age or older.  Having a disability.  Living in a place that is high above the ground or sea (high in altitude). The thinner, dried air causes more fluid loss.  Doing exercises that put stress on your body for a long time. What are the signs or symptoms? Symptoms of dehydration depend on how bad it is. Mild or worse dehydration  Thirst.  Dry lips or dry mouth.  Feeling dizzy or light-headed, especially when you stand up from sitting.  Muscle cramps.  Your body making: ? Dark pee (urine). Pee may be the color of tea. ? Less pee than normal. ? Less tears than normal.  Headache. Very bad dehydration  Changes in skin. Skin may: ? Be cold to the touch (clammy). ? Be blotchy  or pale. ? Not go back to normal right after you lightly pinch it and let it go.  Little or no tears, pee, or sweat.  Changes in vital signs, such as: ? Fast breathing. ? Low blood pressure. ? Weak pulse. ? Pulse that is more than 100 beats a minute when you are sitting still.  Other changes, such as: ? Feeling very thirsty. ? Eyes that look hollow (sunken). ? Cold hands and feet. ? Being mixed up (confused). ? Being very tired (lethargic) or having trouble waking from sleep. ? Short-term weight loss. ? Loss of consciousness. How is this treated? Treatment for this condition depends on how bad it is. Treatment should start right away. Do not wait until your condition gets very bad. Very bad dehydration is an emergency. You will need to go to a hospital.  Mild or worse dehydration can be treated at home. You may be asked to: ? Drink more fluids. ? Drink an oral rehydration solution (ORS). This drink helps get the right amounts of fluids and salts and minerals in the blood (electrolytes).  Very bad dehydration can be treated: ? With fluids through an IV tube. ? By getting normal levels of salts and minerals in your blood. This is often done by giving salts and minerals through a tube. The tube is passed through your nose and into your stomach. ? By treating the root cause. Follow these instructions at   home: Oral rehydration solution If told by your doctor, drink an ORS:  Make an ORS. Use instructions on the package.  Start by drinking small amounts, about  cup (120 mL) every 5-10 minutes.  Slowly drink more until you have had the amount that your doctor said to have. Eating and drinking  Drink enough clear fluid to keep your pee pale yellow. If you were told to drink an ORS, finish the ORS first. Then, start slowly drinking other clear fluids. Drink fluids such as: ? Water. Do not drink only water. Doing that can make the salt (sodium) level in your body get too low. ? Water  from ice chips you suck on. ? Fruit juice that you have added water to (diluted). ? Low-calorie sports drinks.  Eat foods that have the right amounts of salts and minerals, such as: ? Bananas. ? Oranges. ? Potatoes. ? Tomatoes. ? Spinach.  Do not drink alcohol.  Avoid: ? Drinks that have a lot of sugar. These include:  High-calorie sports drinks.  Fruit juice that you did not add water to.  Soda.  Caffeine. ? Foods that are greasy or have a lot of fat or sugar.         General instructions  Take over-the-counter and prescription medicines only as told by your doctor.  Do not take salt tablets. Doing that can make the salt level in your body get too high.  Return to your normal activities as told by your doctor. Ask your doctor what activities are safe for you.  Keep all follow-up visits as told by your doctor. This is important. Contact a doctor if:  You have pain in your belly (abdomen) and the pain: ? Gets worse. ? Stays in one place.  You have a rash.  You have a stiff neck.  You get angry or annoyed (irritable) more easily than normal.  You are more tired or have a harder time waking than normal.  You feel: ? Weak or dizzy. ? Very thirsty. Get help right away if you have:  Any symptoms of very bad dehydration.  Symptoms of vomiting, such as: ? You cannot eat or drink without vomiting. ? Your vomiting gets worse or does not go away. ? Your vomit has blood or green stuff in it.  Symptoms that get worse with treatment.  A fever.  A very bad headache.  Problems with peeing or pooping (having a bowel movement), such as: ? Watery poop that gets worse or does not go away. ? Blood in your poop (stool). This may cause poop to look black and tarry. ? Not peeing in 6-8 hours. ? Peeing only a small amount of very dark pee in 6-8 hours.  Trouble breathing. These symptoms may be an emergency. Do not wait to see if the symptoms will go away. Get  medical help right away. Call your local emergency services (911 in the [place] Do not drive yourself to the hospital. Summary  Dehydration is a condition in which there is not enough water or other fluids in the body. This happens when a person loses more fluids than he or she takes in.  Treatment for this condition depends on how bad it is. Treatment should be started right away. Do not wait until your condition gets very bad.  Drink enough clear fluid to keep your pee pale yellow. If you were told to drink an oral rehydration solution (ORS), finish the ORS first. Then, start slowly drinking other clear fluids.    Take over-the-counter and prescription medicines only as told by your doctor.  Get help right away if you have any symptoms of very bad dehydration. This information is not intended to replace advice given to you by your health care provider. Make sure you discuss any questions you have with your health care provider. Document Revised: 01/22/2019 Document Reviewed: 01/22/2019 Elsevier Patient Education  2021 Elsevier Inc.  

## 2020-08-23 ENCOUNTER — Inpatient Hospital Stay: Payer: Medicaid Other

## 2020-08-24 ENCOUNTER — Telehealth: Payer: No Typology Code available for payment source | Admitting: Adult Health

## 2020-08-31 NOTE — Progress Notes (Deleted)
[place]  [phone] [phone]     ID: Sheryl Mcmillan DOB: 1975-11-17  MR#: [id number]  SVX#:[id number]  Patient Care Team: Patient, No Pcp Per as PCP - General (General Practice) [name], RN as Oncology Nurse Navigator [name], RN as Oncology Nurse Navigator [name], MD as Consulting Physician (General Surgery) Magrinat, [name], MD as Consulting Physician (Oncology) [name], MD as Consulting Physician (Radiation Oncology) Scot Dock, NP OTHER MD:  CHIEF COMPLAINT: estrogen receptor negative, Her2 positive breast cancer  CURRENT TREATMENT: Neoadjuvant chemotherapy   INTERVAL HISTORY: Sheryl Mcmillan returns today for follow up of her estrogen receptor negative, Her2 positive breast cancer accompanied by her daughter.  Sheryl Mcmillan started neoadjuvant chemotherapy on 06/16/2020.  She is receiving Docetaxel, Carboplatin, Trastuzumab, pertuzumab every 21 days with udenyca on day 3 for growth factor support.  Today is cycle 5 day 1.    Her most recent echo was completed on 06/09/2020 and showed an EF of 65-70%.  Since her last visit, she underwent pelvic ultrasound on 08/12/2020. This was normal.   REVIEW OF SYSTEMS: [place] i    COVID 19 VACCINATION STATUS: Status post vaccine x2, no booster as of February 2022   HISTORY OF CURRENT ILLNESS: From the original intake note:   Sheryl Mcmillan presented with a palpable lump in the outer left breast, which she initially felt approximately 6 months prior. She waited to seek medical attention because she had no insurance.  Eventually she enrolled in the [facility] program and underwent bilateral diagnostic mammography with tomography and left breast ultrasonography at [facility] on 05/10/2020 showing: breast density category B; vaguely palpable 2.5 cm mass in left breast at 1:30; no pathologic left axillary lymphadenopathy or evidence of right breast  malignancy.  Accordingly on 05/18/2020 she proceeded to biopsy of the left breast area in question. The pathology from this procedure ([id number]) showed: invasive ductal carcinoma, grade 3. Prognostic indicators significant for: estrogen receptor, 40% positive with weak staining intensity and progesterone receptor, 0% negative. Proliferation marker Ki67 at 50%. HER2 positive by immunohistochemistry (3+).  The patient's subsequent history is as detailed below.   PAST MEDICAL HISTORY: Past Medical History:  Diagnosis Date  . Arthritis   . Breast cancer ([place])   . Family history of non-Hodgkin's lymphoma 06/01/2020  . Hemorrhoids   . Migraines   . Obesity   . PCOS (polycystic ovarian syndrome)     PAST SURGICAL HISTORY: Past Surgical History:  Procedure Laterality Date  . COSMETIC SURGERY    . DILATION AND CURETTAGE OF UTERUS    . HEMORRHOID SURGERY    . IR IMAGING GUIDED PORT INSERTION  06/15/2020    FAMILY HISTORY: Family History  Problem Relation Age of Onset  . Hypertension Mother   . Diabetes Mother   . Cancer Mother 58       unknown primary  . Other Mother        brain tumor; dx 62s  . Non-Hodgkin's lymphoma Brother 75  . Other Maternal Uncle 52       brain tumor  . Breast cancer Other        MGM's niece; dx late 21s  . Pancreatic cancer Neg Hx   . Colon cancer Neg Hx   . Endometrial cancer Neg Hx   . Prostate cancer Neg Hx   . Ovarian cancer Neg Hx    She has no information on her father. Her mother died at age 44  from stage IV cancer. The primary cancer was unknown, but it was not breast.  Sheryl Mcmillan has two half brothers (through her mother), one of which was diagnosed with non-Hodgkin's lymphoma. There is no family history of breast, ovarian, or prostate cancer to her knowledge. // Patient is BRCA1 positive. Daughter tested negative.   GYNECOLOGIC HISTORY:  No LMP recorded. Menarche: 45 years old Age at first live birth: 45 years old [place] P 1 LMP  05/02/2020 Contraceptive: never used HRT n/a  Hysterectomy? no BSO? 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 She is single. Her significant other Sheryl Mcmillan is a Freight forwarder. She lives at home with Sheryl Mcmillan, her daughter Sheryl Mcmillan,  [name]'s daughter Sheryl Mcmillan and her 3-monthold daughter Sheryl Mcmillan Daughter Sheryl Mcmillan age 44 is a bChief Operating Officerat [employer]    ADVANCED DIRECTIVES: She intends to name her daughter Sheryl Birksas her HCPOA.  At the 06/01/2020 visit the patient was given the appropriate documents to complete and notarized at her discretion..[name]  HEALTH MAINTENANCE: Social History   Tobacco Use  . Smoking status: Current Every Day Smoker    Packs/day: 0.50    Years: 10.00    Pack years: 5.00    Types: Cigarettes  . Smokeless tobacco: Never Used  Vaping Use  . Vaping Use: Never used  Substance Use Topics  . Alcohol use: Yes    Comment: occasionally  . Drug use: Yes    Types: Marijuana     Colonoscopy: 2018  PAP: 2018  Bone density: n/a (age)   Allergies  Allergen Reactions  . Crest Tc-Baking Soda [Sodium Fluoride] Hives    Baking Soda    Current Outpatient Medications  Medication Sig Dispense Refill  . dexamethasone (DECADRON) 4 MG tablet Take 2 tablets (8 mg total) by mouth 2 (two) times daily. Start the day before Taxotere. Then take daily x 3 days after chemotherapy. 30 tablet 1  . dibucaine (NUPERCAINAL) 1 % OINT Place 1 application rectally as needed for hemorrhoids. 28 g 1  . diphenoxylate-atropine (LOMOTIL) 2.5-0.025 MG tablet 1 to 2 tablets PO QID prn diarrhea 30 tablet 2  . fluconazole (DIFLUCAN) 200 MG tablet Take 1 tablet (200 mg total) by mouth daily. 7 tablet 0  . hydrocortisone (ANUSOL-HC) 2.5 % rectal cream Place 1 application rectally 2 (two) times daily. 30 g 0  . lidocaine-prilocaine (EMLA) cream Apply to affected area once  30 g 3  . LORazepam (ATIVAN) 0.5 MG tablet Place 1 tablet (0.5 mg total) under the tongue every 6 (six) hours as needed for anxiety. 30 tablet 0  . pramoxine-hydrocortisone (PROCTOCREAM-HC) 1-1 % rectal cream Place 1 application rectally 2 (two) times daily. 30 g 0  . prochlorperazine (COMPAZINE) 10 MG tablet Take 1 tablet (10 mg total) by mouth every 6 (six) hours as needed (Nausea or vomiting). 30 tablet 1   No current facility-administered medications for this visit.   Facility-Administered Medications Ordered in Other Visits  Medication Dose Route Frequency Provider Last Rate Last Admin  . sodium chloride flush (NS) 0.9 % injection 10 mL  10 mL Intravenous PRN Causey, LCharlestine Massed NP        OBJECTIVE: White woman who appears stated age There were no vitals filed for this visit. There is no height or weight on file to calculate BMI. There were no vitals filed for this visit.    LAB  RESULTS:  CMP     Component Value Date/Time   NA 140 08/17/2020 1237   K 4.6 08/17/2020 1237   CL 108 08/17/2020 1237   CO2 22 08/17/2020 1237   GLUCOSE 174 (H) 08/17/2020 1237   BUN 10 08/17/2020 1237   CREATININE 0.92 08/17/2020 1237   CALCIUM 9.1 08/17/2020 1237   PROT 6.9 08/17/2020 1237   ALBUMIN 3.8 08/17/2020 1237   AST 16 08/17/2020 1237   ALT 26 08/17/2020 1237   ALKPHOS 70 08/17/2020 1237   BILITOT <0.2 (L) 08/17/2020 1237   GFRNONAA >60 08/17/2020 1237   GFRAA >60 06/06/2019 1205    No results found for: TOTALPROTELP, ALBUMINELP, A1GS, A2GS, BETS, BETA2SER, GAMS, MSPIKE, SPEI  Lab Results  Component Value Date   WBC 9.6 08/17/2020   NEUTROABS 7.1 08/17/2020   HGB 12.5 08/17/2020   HCT 38.6 08/17/2020   MCV 90.2 08/17/2020   PLT 297 08/17/2020    No results found for: LABCA2  No components found for: [id number]  No results for input(s): INR in the last 168 hours.  No results found for: LABCA2  No results found for: CAN199  Lab Results  Component Value Date    CAN125 10.2 07/28/2020    No results found for: ZDG644  No results found for: CA2729  No components found for: HGQUANT  No results found for: CEA1 / No results found for: CEA1   No results found for: AFPTUMOR  No results found for: CHROMOGRNA  No results found for: KPAFRELGTCHN, LAMBDASER, KAPLAMBRATIO (kappa/lambda light chains)  No results found for: HGBA, HGBA2QUANT, HGBFQUANT, HGBSQUAN (Hemoglobinopathy evaluation)   No results found for: LDH  No results found for: IRON, TIBC, IRONPCTSAT (Iron and TIBC)  No results found for: FERRITIN  Urinalysis    Component Value Date/Time   COLORURINE YELLOW 06/06/2019 [place] 06/06/2019 1205   LABSPEC >1.030 (H) 06/06/2019 1205   PHURINE 5.5 06/06/2019 1205   GLUCOSEU NEGATIVE 06/06/2019 1205   HGBUR TRACE (A) 06/06/2019 1205   BILIRUBINUR NEGATIVE 06/06/2019 1205   KETONESUR NEGATIVE 06/06/2019 1205   PROTEINUR NEGATIVE 06/06/2019 1205   UROBILINOGEN 1.0 05/09/2011 1805   NITRITE NEGATIVE 06/06/2019 1205   LEUKOCYTESUR NEGATIVE 06/06/2019 1205    STUDIES: US PELVIC COMPLETE WITH TRANSVAGINAL  Result Date: 08/12/2020 CLINICAL DATA:  BRCA 1 deleterious mutation, breast cancer on chemotherapy and radiation therapy assessment for occult ovarian cancer; LMP 07/02/2020 EXAM: TRANSABDOMINAL AND TRANSVAGINAL ULTRASOUND OF PELVIS TECHNIQUE: Both transabdominal and transvaginal ultrasound examinations of the pelvis were performed. Transabdominal technique was performed for global imaging of the pelvis including uterus, ovaries, adnexal regions, and pelvic cul-de-sac. It was necessary to proceed with endovaginal exam following the transabdominal exam to visualize the uterus, endometrium, and ovaries. COMPARISON:  05/09/2011 FINDINGS: Uterus Measurements: 7.8 x 3.3 x 4.3 cm = volume: 57 mL. Normal morphology without mass Endometrium Thickness: 7 mm.  No endometrial fluid or focal abnormality Right ovary Measurements:  4.0 x 2.9 x 2.4 cm = volume: 14.2 mL. Normal morphology without mass Left ovary Measurements: 2.9 x 2.8 x 4.0 cm = volume: 16.9 mL. Normal morphology without mass Other findings No free pelvic fluid.  No adnexal masses. IMPRESSION: Normal exam. Electronically Signed   By: Lavonia Dana M.D.   On: 08/12/2020 10:38     ELIGIBLE FOR AVAILABLE RESEARCH PROTOCOL: AET  ASSESSMENT: 45 y.o. [place] woman status post left breast upper outer quadrant biopsy 05/18/2020 for a clinical T2N0, stage IIA invasive  ductal carcinoma, grade 3, estrogen receptor weakly positive, progesterone receptor negative, with an MIB-1 of 50%, and HER-2 positive by immunohistochemistry  (1) genetics testing 06/16/2020 shows a pathogenic mutation in BRCA1 at c.2475del (p.Asp825Glufs*21)  (a)  Variant of uncertain significance in MSH3 at c.2724A>G (Silent).  (b) no additional deleterious mutations through the Multi-Cancer Panel offered by [facility] including AIP, ALK, APC, ATM, AXIN2,BAP1,  BARD1, BLM, BMPR1A, BRCA1, BRCA2, BRIP1, CASR, CDC73, CDH1, CDK4, CDKN1B, CDKN1C, CDKN2A (p14ARF), CDKN2A (p16INK4a), CEBPA, CHEK2, CTNNA1, DICER1, DIS3L2, EGFR (c.2369C>T, p.Thr790Met variant only), EPCAM (Deletion/duplication testing only), FH, FLCN, GATA2, GPC3, GREM1 (Promoter region deletion/duplication testing only), HOXB13 (c.251G>A, p.Gly84Glu), HRAS, KIT, MAX, MEN1, MET, MITF (c.952G>A, p.Glu318Lys variant only), MLH1, MSH2, MSH3, MSH6, MUTYH, NBN, NF1, NF2, NTHL1, PALB2, PDGFRA, PHOX2B, PMS2, POLD1, POLE, POT1, PRKAR1A, PTCH1, PTEN, RAD50, RAD51C, RAD51D, RB1, RECQL4, RET, RNF43, RUNX1, SDHAF2, SDHA (sequence changes only), SDHB, SDHC, SDHD, SMAD4, SMARCA4, SMARCB1, SMARCE1, STK11, SUFU, TERC, TERT, TMEM127, TP53, TSC1, TSC2, VHL, WRN and WT1.   (2) neoadjuvant chemotherapy to consist of carboplatin and docetaxel together with trastuzumab and Pertuzumab every 3 weeks x 6, starting 06/16/2020   (a) pertuzumab omitted after cycle 1  (3)  trastuzumab to be continued to complete 1 year  (a) echo 06/09/2020 shows EF in the 60-65% range  (4) definitive surgery to follow  (5) adjuvant radiation  (6) antiestrogens   PLAN:  Briena is   Total encounter time 25 minutes.   Wilber Bihari, NP 08/31/20 10:29 AM Medical Oncology and Hematology [facility] [place], [place] [address] Tel. [phone]    Fax. [phone]   *Total Encounter Time as defined by the Centers for Medicare and Medicaid Services includes, in addition to the face-to-face time of a patient visit (documented in the note above) non-face-to-face time: obtaining and reviewing outside history, ordering and reviewing medications, tests or procedures, care coordination (communications with other health care professionals or caregivers) and documentation in the medical record.

## 2020-09-01 ENCOUNTER — Telehealth: Payer: No Typology Code available for payment source | Admitting: Adult Health

## 2020-09-06 NOTE — Progress Notes (Signed)
[place]  Telephone:([phone]) [phone] Fax:(336) [phone]     ID: Sheryl Mcmillan DOB: [birth year]  MR#: [id number]  HGD#:[id number]  Patient Care Team: Patient, No Pcp Per as PCP - General (General Practice) Sheryl Kaufmann, RN as Oncology Nurse Navigator Sheryl Germany, RN as Oncology Nurse Navigator Sheryl Bookbinder, MD as Consulting Physician (General Surgery) Cartez Mcmillan, Sheryl Dad, MD as Consulting Physician (Oncology) Sheryl Pray, MD as Consulting Physician (Radiation Oncology) Sheryl Presume, MD as Consulting Physician (Plastic Surgery) Sheryl Cruel, MD OTHER MD:  CHIEF COMPLAINT: estrogen receptor negative, Her2 positive breast cancer  CURRENT TREATMENT: Neoadjuvant chemotherapy   INTERVAL HISTORY: Sheryl Mcmillan returns today for follow up and treatment of her estrogen receptor negative, Her2 positive breast cancer accompanied by her daughter.  Sheryl Mcmillan started neoadjuvant chemotherapy on 06/16/2020.  She receives docetaxel, Carboplatin, Trastuzumab, and docetaxel every 21 days with udenyca on day 3 for growth factor support.  Today is cycle 5 day 1.    Her most recent echo was completed on 06/09/2020 and showed an EF of 65-70%.  She is scheduled for repeat echocardiography 09/14/2020  She just had a pelvic ultrasound on 08/12/2020 for evaluation of the ovaries given her BRCA1 mutation.  This was normal.   REVIEW OF SYSTEMS: Sheryl Mcmillan did much better with this more recent cycle.  For 1 thing the hemorrhoids improved.  Secondly she took more supportive medicines the way she was expected to previously.  She did meet with Dr. Claudia Mcmillan and she says he agreed to do her surgery even if she did not quit smoking but she ended up quitting smoking anyway as of 08/23/2020.  This is very favorable.  A detailed review of systems today was otherwise stable    COVID 19 VACCINATION STATUS: Status post vaccine x2, no booster as of February 2022   HISTORY OF CURRENT ILLNESS: From the  original intake note:   Sheryl Mcmillan presented with a palpable lump in the outer left breast, which she initially felt approximately 6 months prior. She waited to seek medical attention because she had no insurance.  Eventually she enrolled in the [facility] program and underwent bilateral diagnostic mammography with tomography and left breast ultrasonography at [facility] on 05/10/2020 showing: breast density category B; vaguely palpable 2.5 cm mass in left breast at 1:30; no pathologic left axillary lymphadenopathy or evidence of right breast malignancy.  Accordingly on 05/18/2020 she proceeded to biopsy of the left breast area in question. The pathology from this procedure ([id number]) showed: invasive ductal carcinoma, grade 3. Prognostic indicators significant for: estrogen receptor, 40% positive with weak staining intensity and progesterone receptor, 0% negative. Proliferation marker Ki67 at 50%. HER2 positive by immunohistochemistry (3+).  The patient's subsequent history is as detailed below.   PAST MEDICAL HISTORY: Past Medical History:  Diagnosis Date   Arthritis    Breast cancer ([place])    Family history of non-Hodgkin's lymphoma 06/01/2020   Hemorrhoids    Migraines    Obesity    PCOS (polycystic ovarian syndrome)     PAST SURGICAL HISTORY: Past Surgical History:  Procedure Laterality Date   COSMETIC SURGERY     DILATION AND CURETTAGE OF UTERUS     HEMORRHOID SURGERY     IR IMAGING GUIDED PORT INSERTION  06/15/2020    FAMILY HISTORY: Family History  Problem Relation Age of Onset   Hypertension Mother    Diabetes Mother    Cancer Mother 6       unknown  primary   Other Mother        brain tumor; dx 48s   Non-Hodgkin's lymphoma Brother 42   Other Maternal Uncle 96       brain tumor   Breast cancer Other        MGM's niece; dx late 76s   Pancreatic cancer Neg Hx    Colon cancer Neg Hx    Endometrial cancer Neg Hx    Prostate cancer Neg Hx     Ovarian cancer Neg Hx    She has no information on her father. Her mother died at age 33 from stage IV cancer. The primary cancer was unknown, but it was not breast.  Sheryl Mcmillan has two half brothers (through her mother), one of which was diagnosed with non-Hodgkin's lymphoma. There is no family history of breast, ovarian, or prostate cancer to her knowledge. // Patient is BRCA1 positive. Daughter tested negative.   GYNECOLOGIC HISTORY:  No LMP recorded. Menarche: 45 years old Age at first live birth: 45 years old [place] P 1 LMP 05/02/2020 Contraceptive: never used HRT n/a  Hysterectomy? no BSO? 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 She is single. Her significant other Sheryl Mcmillan is a Freight forwarder. She lives at home with Sheryl Mcmillan, her daughter Sheryl Mcmillan,  Sheryl Mcmillan's daughter Sheryl Mcmillan and her 61-monthold daughter Sheryl Mcmillan Daughter Sheryl Mcmillan age 44 is a bChief Operating Officerat [employer]here in [name]    ADVANCED DIRECTIVES: Completed and notarized 09/07/2020.  She has named her aunt Sheryl Reeksas healthcare power of attorney  HEALTH MAINTENANCE: Social History   Tobacco Use   Smoking status: Current Every Day Smoker    Packs/day: 0.50    Years: 10.00    Pack years: 5.00    Types: Cigarettes   Smokeless tobacco: Never Used  Vaping Use   Vaping Use: Never used  Substance Use Topics   Alcohol use: Yes    Comment: occasionally   Drug use: Yes    Types: Marijuana     Colonoscopy: 2018  PAP: 2018  Bone density: n/a (age)   Allergies  Allergen Reactions   Crest Tc-Baking Soda [Sodium Fluoride] Hives    Baking Soda    Current Outpatient Medications  Medication Sig Dispense Refill   dexamethasone (DECADRON) 4 MG tablet Take 2 tablets (8 mg total) by mouth 2 (two) times daily. Start the day before Taxotere. Then take daily x 3 days after chemotherapy. 30 tablet 1    dibucaine (NUPERCAINAL) 1 % OINT Place 1 application rectally as needed for hemorrhoids. 28 g 1   diphenoxylate-atropine (LOMOTIL) 2.5-0.025 MG tablet 1 to 2 tablets PO QID prn diarrhea 30 tablet 2   fluconazole (DIFLUCAN) 200 MG tablet Take 1 tablet (200 mg total) by mouth daily. 7 tablet 0   hydrocortisone (ANUSOL-HC) 2.5 % rectal cream Place 1 application rectally 2 (two) times daily. 30 g 0   lidocaine-prilocaine (EMLA) cream Apply to affected area once 30 g 3   LORazepam (ATIVAN) 0.5 MG tablet Place 1 tablet (0.5 mg total) under the tongue every 6 (six) hours as needed for anxiety. 30 tablet 0   pramoxine-hydrocortisone (PROCTOCREAM-HC) 1-1 % rectal cream Place 1 application rectally 2 (two) times daily. 30 g 0   prochlorperazine (COMPAZINE) 10 MG tablet Take 1 tablet (10 mg total) by mouth every 6 (six) hours as needed (Nausea or vomiting). 30 tablet 1  No current facility-administered medications for this visit.   Facility-Administered Medications Ordered in Other Visits  Medication Dose Route Frequency Provider Last Rate Last Admin   sodium chloride flush (NS) 0.9 % injection 10 mL  10 mL Intravenous PRN Causey, Charlestine Massed, NP        OBJECTIVE: White woman who appears stated age 75:   09/07/20 1307  BP: 139/90  Pulse: 71  Resp: 17  Temp: 97.7 F (36.5 C)  SpO2: 99%   Body mass index is 42.78 kg/m. Filed Weights   09/07/20 1307  Weight: (!) 306 lb 11.2 oz (139.1 kg)    Sclerae unicteric, EOMs intact Wearing a mask No cervical or supraclavicular adenopathy Lungs no rales or rhonchi Heart regular rate and rhythm Abd soft, nontender, positive bowel sounds MSK no focal spinal tenderness, no upper extremity lymphedema Neuro: nonfocal, well oriented, appropriate affect Breasts: Deferred  LAB RESULTS:  CMP     Component Value Date/Time   NA 140 08/17/2020 1237   K 4.6 08/17/2020 1237   CL 108 08/17/2020 1237   CO2 22 08/17/2020 1237   GLUCOSE 174  (H) 08/17/2020 1237   BUN 10 08/17/2020 1237   CREATININE 0.92 08/17/2020 1237   CALCIUM 9.1 08/17/2020 1237   PROT 6.9 08/17/2020 1237   ALBUMIN 3.8 08/17/2020 1237   AST 16 08/17/2020 1237   ALT 26 08/17/2020 1237   ALKPHOS 70 08/17/2020 1237   BILITOT <0.2 (L) 08/17/2020 1237   GFRNONAA >60 08/17/2020 1237   GFRAA >60 06/06/2019 1205    No results found for: TOTALPROTELP, ALBUMINELP, A1GS, A2GS, BETS, BETA2SER, GAMS, MSPIKE, SPEI  Lab Results  Component Value Date   WBC 7.8 09/07/2020   NEUTROABS 6.9 09/07/2020   HGB 12.0 09/07/2020   HCT 37.0 09/07/2020   MCV 91.4 09/07/2020   PLT 234 09/07/2020    No results found for: LABCA2  No components found for: [id number]  No results for input(s): INR in the last 168 hours.  No results found for: LABCA2  No results found for: CAN199  Lab Results  Component Value Date   CAN125 10.2 07/28/2020    No results found for: TAV697  No results found for: CA2729  No components found for: HGQUANT  No results found for: CEA1 / No results found for: CEA1   No results found for: AFPTUMOR  No results found for: CHROMOGRNA  No results found for: KPAFRELGTCHN, LAMBDASER, KAPLAMBRATIO (kappa/lambda light chains)  No results found for: HGBA, HGBA2QUANT, HGBFQUANT, HGBSQUAN (Hemoglobinopathy evaluation)   No results found for: LDH  No results found for: IRON, TIBC, IRONPCTSAT (Iron and TIBC)  No results found for: FERRITIN  Urinalysis    Component Value Date/Time   COLORURINE YELLOW 06/06/2019 [place] 06/06/2019 1205   LABSPEC >1.030 (H) 06/06/2019 1205   PHURINE 5.5 06/06/2019 1205   GLUCOSEU NEGATIVE 06/06/2019 1205   HGBUR TRACE (A) 06/06/2019 1205   BILIRUBINUR NEGATIVE 06/06/2019 1205   KETONESUR NEGATIVE 06/06/2019 1205   PROTEINUR NEGATIVE 06/06/2019 1205   UROBILINOGEN 1.0 05/09/2011 1805   NITRITE NEGATIVE 06/06/2019 1205   LEUKOCYTESUR NEGATIVE 06/06/2019 1205    STUDIES: US PELVIC  COMPLETE WITH TRANSVAGINAL  Result Date: 08/12/2020 CLINICAL DATA:  BRCA 1 deleterious mutation, breast cancer on chemotherapy and radiation therapy assessment for occult ovarian cancer; LMP 07/02/2020 EXAM: TRANSABDOMINAL AND TRANSVAGINAL ULTRASOUND OF PELVIS TECHNIQUE: Both transabdominal and transvaginal ultrasound examinations of the pelvis were performed. Transabdominal technique was performed for global imaging  of the pelvis including uterus, ovaries, adnexal regions, and pelvic cul-de-sac. It was necessary to proceed with endovaginal exam following the transabdominal exam to visualize the uterus, endometrium, and ovaries. COMPARISON:  05/09/2011 FINDINGS: Uterus Measurements: 7.8 x 3.3 x 4.3 cm = volume: 57 mL. Normal morphology without mass Endometrium Thickness: 7 mm.  No endometrial fluid or focal abnormality Right ovary Measurements: 4.0 x 2.9 x 2.4 cm = volume: 14.2 mL. Normal morphology without mass Left ovary Measurements: 2.9 x 2.8 x 4.0 cm = volume: 16.9 mL. Normal morphology without mass Other findings No free pelvic fluid.  No adnexal masses. IMPRESSION: Normal exam. Electronically Signed   By: Lavonia Dana M.D.   On: 08/12/2020 10:38     ELIGIBLE FOR AVAILABLE RESEARCH PROTOCOL: AET  ASSESSMENT: 45 y.o. BRCA positive [place] woman status post left breast upper outer quadrant biopsy 05/18/2020 for a clinical T2N0, stage IIA invasive ductal carcinoma, grade 3, estrogen receptor weakly positive, progesterone receptor negative, with an MIB-1 of 50%, and HER-2 positive by immunohistochemistry  (1) genetics testing 06/16/2020 shows a pathogenic mutation in BRCA1 at c.2475del (p.Asp825Glufs*21)  (a)  Variant of uncertain significance in MSH3 at c.2724A>G (Silent).  (b) no additional deleterious mutations through the Multi-Cancer Panel offered by [facility] including AIP, ALK, APC, ATM, AXIN2,BAP1,  BARD1, BLM, BMPR1A, BRCA1, BRCA2, BRIP1, CASR, CDC73, CDH1, CDK4, CDKN1B, CDKN1C, CDKN2A  (p14ARF), CDKN2A (p16INK4a), CEBPA, CHEK2, CTNNA1, DICER1, DIS3L2, EGFR (c.2369C>T, p.Thr790Met variant only), EPCAM (Deletion/duplication testing only), FH, FLCN, GATA2, GPC3, GREM1 (Promoter region deletion/duplication testing only), HOXB13 (c.251G>A, p.Gly84Glu), HRAS, KIT, MAX, MEN1, MET, MITF (c.952G>A, p.Glu318Lys variant only), MLH1, MSH2, MSH3, MSH6, MUTYH, NBN, NF1, NF2, NTHL1, PALB2, PDGFRA, PHOX2B, PMS2, POLD1, POLE, POT1, PRKAR1A, PTCH1, PTEN, RAD50, RAD51C, RAD51D, RB1, RECQL4, RET, RNF43, RUNX1, SDHAF2, SDHA (sequence changes only), SDHB, SDHC, SDHD, SMAD4, SMARCA4, SMARCB1, SMARCE1, STK11, SUFU, TERC, TERT, TMEM127, TP53, TSC1, TSC2, VHL, WRN and WT1.   (2) neoadjuvant chemotherapy to consist of carboplatin and docetaxel together with trastuzumab and Pertuzumab every 3 weeks x 6, starting 06/16/2020   (a) pertuzumab omitted after cycle 1  (3) trastuzumab to be continued to complete 1 year  (a) echo 06/09/2020 shows EF in the 60-65% range  (4) definitive surgery to follow  (5) adjuvant radiation  (6) antiestrogens   PLAN:  Maeve is tolerating treatment well and is proceeding to cycle #5 of 6 planned tomorrow.  Unfortunately our shortage of nurses means at we just do not have the capacity to accommodate everybody as scheduled.  She is very accommodating and agrees to come tomorrow for treatment.  She will see me again in 3 weeks and that will be her final chemotherapy dose.  She will get an MRI shortly after that and proceed to surgery.  We are continuing anti-HER-2 treatment of course to complete 1 year  Today we were able to get her advanced directive completed and notarized which is very helpful  Total encounter time 25 minutes.Sarajane Jews C. Lyrah Bradt, MD 09/07/20 1:30 PM Medical Oncology and Hematology [facility] [address], [place] [address] Tel. [phone]    Fax. [phone]   I, Wilburn Mylar, am acting as scribe for Dr. Virgie Mcmillan.  Delvecchio Madole.  I, Lurline Del MD, have reviewed the above documentation for accuracy and completeness, and I agree with the above.   *Total Encounter Time as defined by the Centers for Medicare and Medicaid Services includes, in addition to the face-to-face time of a patient visit (  documented in the note above) non-face-to-face time: obtaining and reviewing outside history, ordering and reviewing medications, tests or procedures, care coordination (communications with other health care professionals or caregivers) and documentation in the medical record.

## 2020-09-07 ENCOUNTER — Inpatient Hospital Stay: Payer: Medicaid Other | Admitting: Licensed Clinical Social Worker

## 2020-09-07 ENCOUNTER — Encounter: Payer: Self-pay | Admitting: Licensed Clinical Social Worker

## 2020-09-07 ENCOUNTER — Inpatient Hospital Stay: Payer: Medicaid Other | Attending: Oncology

## 2020-09-07 ENCOUNTER — Inpatient Hospital Stay (HOSPITAL_BASED_OUTPATIENT_CLINIC_OR_DEPARTMENT_OTHER): Payer: Medicaid Other | Admitting: Oncology

## 2020-09-07 ENCOUNTER — Other Ambulatory Visit: Payer: Self-pay

## 2020-09-07 VITALS — BP 139/90 | HR 71 | Temp 97.7°F | Resp 17 | Ht 71.0 in | Wt 306.7 lb

## 2020-09-07 DIAGNOSIS — C50412 Malignant neoplasm of upper-outer quadrant of left female breast: Secondary | ICD-10-CM

## 2020-09-07 DIAGNOSIS — Z803 Family history of malignant neoplasm of breast: Secondary | ICD-10-CM | POA: Insufficient documentation

## 2020-09-07 DIAGNOSIS — Z5189 Encounter for other specified aftercare: Secondary | ICD-10-CM | POA: Insufficient documentation

## 2020-09-07 DIAGNOSIS — Z5112 Encounter for antineoplastic immunotherapy: Secondary | ICD-10-CM | POA: Insufficient documentation

## 2020-09-07 DIAGNOSIS — Z170 Estrogen receptor positive status [ER+]: Secondary | ICD-10-CM

## 2020-09-07 DIAGNOSIS — F17210 Nicotine dependence, cigarettes, uncomplicated: Secondary | ICD-10-CM | POA: Insufficient documentation

## 2020-09-07 DIAGNOSIS — Z807 Family history of other malignant neoplasms of lymphoid, hematopoietic and related tissues: Secondary | ICD-10-CM | POA: Insufficient documentation

## 2020-09-07 DIAGNOSIS — F1290 Cannabis use, unspecified, uncomplicated: Secondary | ICD-10-CM | POA: Insufficient documentation

## 2020-09-07 DIAGNOSIS — Z1501 Genetic susceptibility to malignant neoplasm of breast: Secondary | ICD-10-CM

## 2020-09-07 DIAGNOSIS — Z1509 Genetic susceptibility to other malignant neoplasm: Secondary | ICD-10-CM

## 2020-09-07 LAB — CMP (CANCER CENTER ONLY)
ALT: 30 U/L (ref 0–44)
AST: 18 U/L (ref 15–41)
Albumin: 3.9 g/dL (ref 3.5–5.0)
Alkaline Phosphatase: 78 U/L (ref 38–126)
Anion gap: 6 (ref 5–15)
BUN: 9 mg/dL (ref 6–20)
CO2: 23 mmol/L (ref 22–32)
Calcium: 9.1 mg/dL (ref 8.9–10.3)
Chloride: 107 mmol/L (ref 98–111)
Creatinine: 0.84 mg/dL (ref 0.44–1.00)
GFR, Estimated: >60 mL/min (ref >60)
Glucose, Bld: 159 mg/dL — ABNORMAL HIGH (ref 70–99)
Potassium: 4.5 mmol/L (ref 3.5–5.1)
Sodium: 136 mmol/L (ref 135–145)
Total Bilirubin: 0.2 mg/dL — ABNORMAL LOW (ref 0.3–1.2)
Total Protein: 7 g/dL (ref 6.5–8.1)

## 2020-09-07 LAB — CBC WITH DIFFERENTIAL (CANCER CENTER ONLY)
Abs Immature Granulocytes: 0.03 10*3/uL (ref 0.00–0.07)
Basophils Absolute: 0 10*3/uL (ref 0.0–0.1)
Basophils Relative: 0 %
Eosinophils Absolute: 0 10*3/uL (ref 0.0–0.5)
Eosinophils Relative: 0 %
HCT: 37 % (ref 36.0–46.0)
Hemoglobin: 12 g/dL (ref 12.0–15.0)
Immature Granulocytes: 0 %
Lymphocytes Relative: 10 %
Lymphs Abs: 0.8 10*3/uL (ref 0.7–4.0)
MCH: 29.6 pg (ref 26.0–34.0)
MCHC: 32.4 g/dL (ref 30.0–36.0)
MCV: 91.4 fL (ref 80.0–100.0)
Monocytes Absolute: 0.1 10*3/uL (ref 0.1–1.0)
Monocytes Relative: 1 %
Neutro Abs: 6.9 10*3/uL (ref 1.7–7.7)
Neutrophils Relative %: 89 %
Platelet Count: 234 10*3/uL (ref 150–400)
RBC: 4.05 MIL/uL (ref 3.87–5.11)
RDW: 18.7 % — ABNORMAL HIGH (ref 11.5–15.5)
WBC Count: 7.8 10*3/uL (ref 4.0–10.5)
nRBC: 0 % (ref 0.0–0.2)

## 2020-09-07 NOTE — Progress Notes (Signed)
[place] CSW Progress Note  Clinical Education officer, museum met with patient to work on [employer]. All documents received, letter written, and application submitted.  CSW also provided 3rd set of [employer].    Christeen Douglas LCSW

## 2020-09-08 ENCOUNTER — Encounter: Payer: Self-pay | Admitting: *Deleted

## 2020-09-08 ENCOUNTER — Ambulatory Visit: Payer: Medicaid Other

## 2020-09-08 ENCOUNTER — Ambulatory Visit: Payer: Medicaid Other | Admitting: Adult Health

## 2020-09-08 ENCOUNTER — Other Ambulatory Visit: Payer: Medicaid Other

## 2020-09-08 ENCOUNTER — Inpatient Hospital Stay: Payer: Medicaid Other

## 2020-09-08 ENCOUNTER — Other Ambulatory Visit: Payer: Self-pay | Admitting: Oncology

## 2020-09-08 VITALS — BP 148/90 | HR 86

## 2020-09-08 DIAGNOSIS — Z5112 Encounter for antineoplastic immunotherapy: Secondary | ICD-10-CM | POA: Diagnosis not present

## 2020-09-08 DIAGNOSIS — T7840XA Allergy, unspecified, initial encounter: Secondary | ICD-10-CM

## 2020-09-08 DIAGNOSIS — C50412 Malignant neoplasm of upper-outer quadrant of left female breast: Secondary | ICD-10-CM

## 2020-09-08 DIAGNOSIS — Z170 Estrogen receptor positive status [ER+]: Secondary | ICD-10-CM

## 2020-09-08 MED ORDER — SODIUM CHLORIDE 0.9% FLUSH
3.0000 mL | INTRAVENOUS | Status: DC | PRN
  Filled 2020-09-08: qty 10

## 2020-09-08 MED ORDER — EPINEPHRINE 0.3 MG/0.3ML IJ SOAJ
0.3000 mg | Freq: Once | INTRAMUSCULAR | Status: DC | PRN
  Filled 2020-09-08: qty 0.6

## 2020-09-08 MED ORDER — SODIUM CHLORIDE 0.9 % IV SOLN
Freq: Once | INTRAVENOUS | Status: DC | PRN
  Filled 2020-09-08: qty 250

## 2020-09-08 MED ORDER — DIPHENHYDRAMINE HCL 25 MG PO CAPS
ORAL_CAPSULE | ORAL | Status: AC
  Filled 2020-09-08: qty 1

## 2020-09-08 MED ORDER — SODIUM CHLORIDE 0.9 % IV SOLN
150.0000 mg | Freq: Once | INTRAVENOUS | Status: AC
  Administered 2020-09-08: 150 mg via INTRAVENOUS
  Filled 2020-09-08: qty 150

## 2020-09-08 MED ORDER — ACETAMINOPHEN 325 MG PO TABS
ORAL_TABLET | ORAL | Status: AC
  Filled 2020-09-08: qty 2

## 2020-09-08 MED ORDER — TRASTUZUMAB-DKST CHEMO 150 MG IV SOLR
6.0000 mg/kg | Freq: Once | INTRAVENOUS | Status: AC
  Administered 2020-09-08: 840 mg via INTRAVENOUS
  Filled 2020-09-08: qty 40

## 2020-09-08 MED ORDER — FAMOTIDINE IN NACL 20-0.9 MG/50ML-% IV SOLN
20.0000 mg | Freq: Once | INTRAVENOUS | Status: AC | PRN
  Administered 2020-09-08: 20 mg via INTRAVENOUS

## 2020-09-08 MED ORDER — ALBUTEROL SULFATE (2.5 MG/3ML) 0.083% IN NEBU
2.5000 mg | INHALATION_SOLUTION | Freq: Once | RESPIRATORY_TRACT | Status: DC | PRN
  Filled 2020-09-08: qty 3

## 2020-09-08 MED ORDER — SODIUM CHLORIDE 0.9% FLUSH
10.0000 mL | INTRAVENOUS | Status: DC | PRN
  Administered 2020-09-08: 10 mL
  Filled 2020-09-08: qty 10

## 2020-09-08 MED ORDER — PALONOSETRON HCL INJECTION 0.25 MG/5ML
0.2500 mg | Freq: Once | INTRAVENOUS | Status: AC
  Administered 2020-09-08: 0.25 mg via INTRAVENOUS

## 2020-09-08 MED ORDER — SODIUM CHLORIDE 0.9 % IV SOLN
75.0000 mg/m2 | Freq: Once | INTRAVENOUS | Status: AC
  Administered 2020-09-08: 200 mg via INTRAVENOUS
  Filled 2020-09-08: qty 20

## 2020-09-08 MED ORDER — SODIUM CHLORIDE 0.9 % IV SOLN
Freq: Once | INTRAVENOUS | Status: AC
  Filled 2020-09-08: qty 250

## 2020-09-08 MED ORDER — METHYLPREDNISOLONE SODIUM SUCC 125 MG IJ SOLR
125.0000 mg | Freq: Once | INTRAMUSCULAR | Status: AC | PRN
  Administered 2020-09-08: 125 mg via INTRAVENOUS

## 2020-09-08 MED ORDER — PALONOSETRON HCL INJECTION 0.25 MG/5ML
INTRAVENOUS | Status: AC
  Filled 2020-09-08: qty 5

## 2020-09-08 MED ORDER — DIPHENHYDRAMINE HCL 25 MG PO CAPS
25.0000 mg | ORAL_CAPSULE | Freq: Once | ORAL | Status: AC
  Administered 2020-09-08: 25 mg via ORAL

## 2020-09-08 MED ORDER — HEPARIN SOD (PORK) LOCK FLUSH 100 UNIT/ML IV SOLN
500.0000 [IU] | Freq: Once | INTRAVENOUS | Status: AC | PRN
  Administered 2020-09-08: 500 [IU]
  Filled 2020-09-08: qty 5

## 2020-09-08 MED ORDER — HEPARIN SOD (PORK) LOCK FLUSH 100 UNIT/ML IV SOLN
250.0000 [IU] | Freq: Once | INTRAVENOUS | Status: DC | PRN
  Filled 2020-09-08: qty 5

## 2020-09-08 MED ORDER — SODIUM CHLORIDE 0.9 % IV SOLN
750.0000 mg | Freq: Once | INTRAVENOUS | Status: AC
  Administered 2020-09-08: 750 mg via INTRAVENOUS
  Filled 2020-09-08: qty 75

## 2020-09-08 MED ORDER — LORATADINE 10 MG PO TABS
10.0000 mg | ORAL_TABLET | Freq: Every day | ORAL | Status: DC
  Administered 2020-09-08: 10 mg via ORAL
  Filled 2020-09-08: qty 1

## 2020-09-08 MED ORDER — ALTEPLASE 2 MG IJ SOLR
2.0000 mg | Freq: Once | INTRAMUSCULAR | Status: DC | PRN
  Filled 2020-09-08: qty 2

## 2020-09-08 MED ORDER — DIPHENHYDRAMINE HCL 50 MG/ML IJ SOLN
50.0000 mg | Freq: Once | INTRAMUSCULAR | Status: AC | PRN
  Administered 2020-09-08: 50 mg via INTRAVENOUS

## 2020-09-08 MED ORDER — SODIUM CHLORIDE 0.9 % IV SOLN
10.0000 mg | Freq: Once | INTRAVENOUS | Status: AC
  Administered 2020-09-08: 10 mg via INTRAVENOUS
  Filled 2020-09-08: qty 10

## 2020-09-08 MED ORDER — ACETAMINOPHEN 325 MG PO TABS
650.0000 mg | ORAL_TABLET | Freq: Once | ORAL | Status: AC
Start: 2020-09-08 — End: 2020-09-08
  Administered 2020-09-08: 650 mg via ORAL

## 2020-09-08 NOTE — Progress Notes (Unsigned)
Dr Jana Hakim notified pt had a reaction to carboplatin and treated per protocol. . Infusion RN reporting pt currently stable. Per Dr Jana Hakim : Do no re-challenge pt at this time and pt okay for discharge home.

## 2020-09-08 NOTE — Progress Notes (Signed)
At 1330 , after getting two thirds of her Carboplatin infusion, pt. complained of being itchy all over. Hives noticed on her face, chest, back and arms. Infusion was stopped, new line with NS started. Solu-Medrol, Benadryl and Pepcid IV given (see MAR) and Scherrie Bateman, NP called to evaluate patient. Per [employer] verbal order, Claritin one tablet given orally as well. Pt. stopped itching completely at 1410. Dr. [name] nurse, Beth was contacted via secure chat to find out form the doctor if he wants [place] to rechallenge the patient. She said that Dr. Jana Hakim said no, and that she will put a note in the chart. Pt. was discharged stable and asymptomatic when her daughter came to pick her up.

## 2020-09-09 ENCOUNTER — Other Ambulatory Visit: Payer: Self-pay

## 2020-09-09 ENCOUNTER — Inpatient Hospital Stay: Payer: Medicaid Other

## 2020-09-09 VITALS — BP 122/78 | HR 78 | Temp 98.4°F | Resp 17

## 2020-09-09 DIAGNOSIS — Z5112 Encounter for antineoplastic immunotherapy: Secondary | ICD-10-CM | POA: Diagnosis not present

## 2020-09-09 DIAGNOSIS — Z170 Estrogen receptor positive status [ER+]: Secondary | ICD-10-CM

## 2020-09-09 MED ORDER — PEGFILGRASTIM-CBQV 6 MG/0.6ML ~~LOC~~ SOSY
6.0000 mg | PREFILLED_SYRINGE | Freq: Once | SUBCUTANEOUS | Status: AC
  Administered 2020-09-09: 6 mg via SUBCUTANEOUS

## 2020-09-09 NOTE — Patient Instructions (Signed)
Pegfilgrastim injection What is this medicine? PEGFILGRASTIM (PEG fil gra stim) is a long-acting granulocyte colony-stimulating factor that stimulates the growth of neutrophils, a type of white blood cell important in the body's fight against infection. It is used to reduce the incidence of fever and infection in patients with certain types of cancer who are receiving chemotherapy that affects the bone marrow, and to increase survival after being exposed to high doses of radiation. This medicine may be used for other purposes; ask your health care provider or pharmacist if you have questions. COMMON BRAND NAME(S): Fulphila, Neulasta, Nyvepria, UDENYCA, Ziextenzo What should I tell my health care provider before I take this medicine? They need to know if you have any of these conditions:  kidney disease  latex allergy  ongoing radiation therapy  sickle cell disease  skin reactions to acrylic adhesives (On-Body Injector only)  an unusual or allergic reaction to pegfilgrastim, filgrastim, other medicines, foods, dyes, or preservatives  pregnant or trying to get pregnant  breast-feeding How should I use this medicine? This medicine is for injection under the skin. If you get this medicine at home, you will be taught how to prepare and give the pre-filled syringe or how to use the On-body Injector. Refer to the patient Instructions for Use for detailed instructions. Use exactly as directed. Tell your healthcare provider immediately if you suspect that the On-body Injector may not have performed as intended or if you suspect the use of the On-body Injector resulted in a missed or partial dose. It is important that you put your used needles and syringes in a special sharps container. Do not put them in a trash can. If you do not have a sharps container, call your pharmacist or healthcare provider to get one. Talk to your pediatrician regarding the use of this medicine in children. While this drug  may be prescribed for selected conditions, precautions do apply. Overdosage: If you think you have taken too much of this medicine contact a poison control center or emergency room at once. NOTE: This medicine is only for you. Do not share this medicine with others. What if I miss a dose? It is important not to miss your dose. Call your doctor or health care professional if you miss your dose. If you miss a dose due to an On-body Injector failure or leakage, a new dose should be administered as soon as possible using a single prefilled syringe for manual use. What may interact with this medicine? Interactions have not been studied. This list may not describe all possible interactions. Give your health care provider a list of all the medicines, herbs, non-prescription drugs, or dietary supplements you use. Also tell them if you smoke, drink alcohol, or use illegal drugs. Some items may interact with your medicine. What should I watch for while using this medicine? Your condition will be monitored carefully while you are receiving this medicine. You may need blood work done while you are taking this medicine. Talk to your health care provider about your risk of cancer. You may be more at risk for certain types of cancer if you take this medicine. If you are going to need a MRI, CT scan, or other procedure, tell your doctor that you are using this medicine (On-Body Injector only). What side effects may I notice from receiving this medicine? Side effects that you should report to your doctor or health care professional as soon as possible:  allergic reactions (skin rash, itching or hives, swelling of   the face, lips, or tongue)  back pain  dizziness  fever  pain, redness, or irritation at site where injected  pinpoint red spots on the skin  red or dark-brown urine  shortness of breath or breathing problems  stomach or side pain, or pain at the shoulder  swelling  tiredness  trouble  passing urine or change in the amount of urine  unusual bruising or bleeding Side effects that usually do not require medical attention (report to your doctor or health care professional if they continue or are bothersome):  bone pain  muscle pain This list may not describe all possible side effects. Call your doctor for medical advice about side effects. You may report side effects to FDA at 1-800-FDA-1088. Where should I keep my medicine? Keep out of the reach of children. If you are using this medicine at home, you will be instructed on how to store it. Throw away any unused medicine after the expiration date on the label. NOTE: This sheet is a summary. It may not cover all possible information. If you have questions about this medicine, talk to your doctor, pharmacist, or health care provider.  2021 Elsevier/Gold Standard (2019-07-03 13:20:51)  

## 2020-09-10 ENCOUNTER — Ambulatory Visit: Payer: Medicaid Other

## 2020-09-12 ENCOUNTER — Inpatient Hospital Stay: Payer: Medicaid Other

## 2020-09-12 ENCOUNTER — Other Ambulatory Visit: Payer: Self-pay

## 2020-09-12 ENCOUNTER — Telehealth: Payer: Self-pay | Admitting: *Deleted

## 2020-09-12 VITALS — BP 122/96 | HR 80 | Temp 98.9°F | Resp 17

## 2020-09-12 DIAGNOSIS — Z95828 Presence of other vascular implants and grafts: Secondary | ICD-10-CM

## 2020-09-12 DIAGNOSIS — Z5112 Encounter for antineoplastic immunotherapy: Secondary | ICD-10-CM | POA: Diagnosis not present

## 2020-09-12 MED ORDER — ONDANSETRON HCL 4 MG/2ML IJ SOLN
8.0000 mg | Freq: Once | INTRAMUSCULAR | Status: AC
  Administered 2020-09-12: 8 mg via INTRAVENOUS

## 2020-09-12 MED ORDER — ONDANSETRON HCL 4 MG/2ML IJ SOLN
INTRAMUSCULAR | Status: AC
  Filled 2020-09-12: qty 4

## 2020-09-12 MED ORDER — SODIUM CHLORIDE 0.9 % IV SOLN
INTRAVENOUS | Status: AC
  Filled 2020-09-12 (×2): qty 250

## 2020-09-12 NOTE — Patient Instructions (Signed)
Dehydration, Adult Dehydration is condition in which there is not enough water or other fluids in the body. This happens when a person loses more fluids than he or she takes in. Important body parts cannot work right without the right amount of fluids. Any loss of fluids from the body can cause dehydration. Dehydration can be mild, worse, or very bad. It should be treated right away to keep it from getting very bad. What are the causes? This condition may be caused by:  Conditions that cause loss of water or other fluids, such as: ? Watery poop (diarrhea). ? Vomiting. ? Sweating a lot. ? Peeing (urinating) a lot.  Not drinking enough fluids, especially when you: ? Are ill. ? Are doing things that take a lot of energy to do.  Other illnesses and conditions, such as fever or infection.  Certain medicines, such as medicines that take extra fluid out of the body (diuretics).  Lack of safe drinking water.  Not being able to get enough water and food. What increases the risk? The following factors may make you more likely to develop this condition:  Having a long-term (chronic) illness that has not been treated the right way, such as: ? Diabetes. ? Heart disease. ? Kidney disease.  Being 65 years of age or older.  Having a disability.  Living in a place that is high above the ground or sea (high in altitude). The thinner, dried air causes more fluid loss.  Doing exercises that put stress on your body for a long time. What are the signs or symptoms? Symptoms of dehydration depend on how bad it is. Mild or worse dehydration  Thirst.  Dry lips or dry mouth.  Feeling dizzy or light-headed, especially when you stand up from sitting.  Muscle cramps.  Your body making: ? Dark pee (urine). Pee may be the color of tea. ? Less pee than normal. ? Less tears than normal.  Headache. Very bad dehydration  Changes in skin. Skin may: ? Be cold to the touch (clammy). ? Be blotchy  or pale. ? Not go back to normal right after you lightly pinch it and let it go.  Little or no tears, pee, or sweat.  Changes in vital signs, such as: ? Fast breathing. ? Low blood pressure. ? Weak pulse. ? Pulse that is more than 100 beats a minute when you are sitting still.  Other changes, such as: ? Feeling very thirsty. ? Eyes that look hollow (sunken). ? Cold hands and feet. ? Being mixed up (confused). ? Being very tired (lethargic) or having trouble waking from sleep. ? Short-term weight loss. ? Loss of consciousness. How is this treated? Treatment for this condition depends on how bad it is. Treatment should start right away. Do not wait until your condition gets very bad. Very bad dehydration is an emergency. You will need to go to a hospital.  Mild or worse dehydration can be treated at home. You may be asked to: ? Drink more fluids. ? Drink an oral rehydration solution (ORS). This drink helps get the right amounts of fluids and salts and minerals in the blood (electrolytes).  Very bad dehydration can be treated: ? With fluids through an IV tube. ? By getting normal levels of salts and minerals in your blood. This is often done by giving salts and minerals through a tube. The tube is passed through your nose and into your stomach. ? By treating the root cause. Follow these instructions at   home: Oral rehydration solution If told by your doctor, drink an ORS:  Make an ORS. Use instructions on the package.  Start by drinking small amounts, about  cup (120 mL) every 5-10 minutes.  Slowly drink more until you have had the amount that your doctor said to have. Eating and drinking  Drink enough clear fluid to keep your pee pale yellow. If you were told to drink an ORS, finish the ORS first. Then, start slowly drinking other clear fluids. Drink fluids such as: ? Water. Do not drink only water. Doing that can make the salt (sodium) level in your body get too low. ? Water  from ice chips you suck on. ? Fruit juice that you have added water to (diluted). ? Low-calorie sports drinks.  Eat foods that have the right amounts of salts and minerals, such as: ? Bananas. ? Oranges. ? Potatoes. ? Tomatoes. ? Spinach.  Do not drink alcohol.  Avoid: ? Drinks that have a lot of sugar. These include:  High-calorie sports drinks.  Fruit juice that you did not add water to.  Soda.  Caffeine. ? Foods that are greasy or have a lot of fat or sugar.         General instructions  Take over-the-counter and prescription medicines only as told by your doctor.  Do not take salt tablets. Doing that can make the salt level in your body get too high.  Return to your normal activities as told by your doctor. Ask your doctor what activities are safe for you.  Keep all follow-up visits as told by your doctor. This is important. Contact a doctor if:  You have pain in your belly (abdomen) and the pain: ? Gets worse. ? Stays in one place.  You have a rash.  You have a stiff neck.  You get angry or annoyed (irritable) more easily than normal.  You are more tired or have a harder time waking than normal.  You feel: ? Weak or dizzy. ? Very thirsty. Get help right away if you have:  Any symptoms of very bad dehydration.  Symptoms of vomiting, such as: ? You cannot eat or drink without vomiting. ? Your vomiting gets worse or does not go away. ? Your vomit has blood or green stuff in it.  Symptoms that get worse with treatment.  A fever.  A very bad headache.  Problems with peeing or pooping (having a bowel movement), such as: ? Watery poop that gets worse or does not go away. ? Blood in your poop (stool). This may cause poop to look black and tarry. ? Not peeing in 6-8 hours. ? Peeing only a small amount of very dark pee in 6-8 hours.  Trouble breathing. These symptoms may be an emergency. Do not wait to see if the symptoms will go away. Get  medical help right away. Call your local emergency services (911 in the [place] Do not drive yourself to the hospital. Summary  Dehydration is a condition in which there is not enough water or other fluids in the body. This happens when a person loses more fluids than he or she takes in.  Treatment for this condition depends on how bad it is. Treatment should be started right away. Do not wait until your condition gets very bad.  Drink enough clear fluid to keep your pee pale yellow. If you were told to drink an oral rehydration solution (ORS), finish the ORS first. Then, start slowly drinking other clear fluids.    Take over-the-counter and prescription medicines only as told by your doctor.  Get help right away if you have any symptoms of very bad dehydration. This information is not intended to replace advice given to you by your health care provider. Make sure you discuss any questions you have with your health care provider. Document Revised: 01/22/2019 Document Reviewed: 01/22/2019 Elsevier Patient Education  2021 Elsevier Inc.  

## 2020-09-12 NOTE — Telephone Encounter (Signed)
Called pt with appt to see Dr. Donne Hazel on 4/19 at 2:15pm. Confirmed appt date and time and provided directions. No further needs voiced

## 2020-09-14 ENCOUNTER — Other Ambulatory Visit: Payer: Self-pay

## 2020-09-14 ENCOUNTER — Ambulatory Visit (HOSPITAL_COMMUNITY)
Admission: RE | Admit: 2020-09-14 | Discharge: 2020-09-14 | Disposition: A | Discharge status: Home / Self Care | Payer: Medicaid Other | Source: Ambulatory Visit | Attending: Oncology | Admitting: Oncology

## 2020-09-14 DIAGNOSIS — Z1509 Genetic susceptibility to other malignant neoplasm: Secondary | ICD-10-CM | POA: Diagnosis not present

## 2020-09-14 DIAGNOSIS — Z170 Estrogen receptor positive status [ER+]: Secondary | ICD-10-CM | POA: Diagnosis not present

## 2020-09-14 DIAGNOSIS — Z0189 Encounter for other specified special examinations: Secondary | ICD-10-CM | POA: Diagnosis not present

## 2020-09-14 DIAGNOSIS — Z01818 Encounter for other preprocedural examination: Secondary | ICD-10-CM | POA: Diagnosis present

## 2020-09-14 DIAGNOSIS — Z1501 Genetic susceptibility to malignant neoplasm of breast: Secondary | ICD-10-CM | POA: Diagnosis not present

## 2020-09-14 DIAGNOSIS — C50412 Malignant neoplasm of upper-outer quadrant of left female breast: Secondary | ICD-10-CM | POA: Diagnosis not present

## 2020-09-14 LAB — ECHOCARDIOGRAM COMPLETE
Area-P 1/2: 3.37 cm2
S' Lateral: 3.3 cm

## 2020-09-14 NOTE — Progress Notes (Signed)
  Echocardiogram 2D Echocardiogram has been performed.  Sheryl Mcmillan 09/14/2020, 11:19 AM

## 2020-09-15 ENCOUNTER — Other Ambulatory Visit: Payer: Self-pay | Admitting: Oncology

## 2020-09-15 DIAGNOSIS — R112 Nausea with vomiting, unspecified: Secondary | ICD-10-CM

## 2020-09-15 DIAGNOSIS — Z170 Estrogen receptor positive status [ER+]: Secondary | ICD-10-CM

## 2020-09-15 DIAGNOSIS — C50412 Malignant neoplasm of upper-outer quadrant of left female breast: Secondary | ICD-10-CM

## 2020-09-15 MED FILL — PROCHLORPERAZINE 10 MG TAB: 10 | 7 days supply | Qty: 30 | Fill #0

## 2020-09-15 MED FILL — LORAZEPAM 0.5 MG TABS: 0.5 | 30 days supply | Qty: 30 | Fill #0

## 2020-09-28 ENCOUNTER — Inpatient Hospital Stay (HOSPITAL_BASED_OUTPATIENT_CLINIC_OR_DEPARTMENT_OTHER): Payer: Medicaid Other | Admitting: Oncology

## 2020-09-28 ENCOUNTER — Inpatient Hospital Stay: Payer: Medicaid Other | Attending: Oncology

## 2020-09-28 ENCOUNTER — Other Ambulatory Visit: Payer: Self-pay

## 2020-09-28 ENCOUNTER — Encounter: Payer: Self-pay | Admitting: Licensed Clinical Social Worker

## 2020-09-28 VITALS — BP 135/93 | HR 104 | Temp 97.7°F | Resp 20 | Ht 71.0 in | Wt 309.3 lb

## 2020-09-28 DIAGNOSIS — Z1501 Genetic susceptibility to malignant neoplasm of breast: Secondary | ICD-10-CM | POA: Diagnosis not present

## 2020-09-28 DIAGNOSIS — Z809 Family history of malignant neoplasm, unspecified: Secondary | ICD-10-CM | POA: Diagnosis not present

## 2020-09-28 DIAGNOSIS — F17210 Nicotine dependence, cigarettes, uncomplicated: Secondary | ICD-10-CM | POA: Diagnosis not present

## 2020-09-28 DIAGNOSIS — C50412 Malignant neoplasm of upper-outer quadrant of left female breast: Secondary | ICD-10-CM | POA: Diagnosis not present

## 2020-09-28 DIAGNOSIS — Z1509 Genetic susceptibility to other malignant neoplasm: Secondary | ICD-10-CM | POA: Diagnosis not present

## 2020-09-28 DIAGNOSIS — Z5189 Encounter for other specified aftercare: Secondary | ICD-10-CM | POA: Diagnosis not present

## 2020-09-28 DIAGNOSIS — Z803 Family history of malignant neoplasm of breast: Secondary | ICD-10-CM | POA: Insufficient documentation

## 2020-09-28 DIAGNOSIS — Z170 Estrogen receptor positive status [ER+]: Secondary | ICD-10-CM

## 2020-09-28 DIAGNOSIS — Z5111 Encounter for antineoplastic chemotherapy: Secondary | ICD-10-CM | POA: Diagnosis present

## 2020-09-28 DIAGNOSIS — Z806 Family history of leukemia: Secondary | ICD-10-CM | POA: Insufficient documentation

## 2020-09-28 LAB — CBC WITH DIFFERENTIAL (CANCER CENTER ONLY)
Abs Immature Granulocytes: 0.06 10*3/uL (ref 0.00–0.07)
Basophils Absolute: 0 10*3/uL (ref 0.0–0.1)
Basophils Relative: 0 %
Eosinophils Absolute: 0 10*3/uL (ref 0.0–0.5)
Eosinophils Relative: 0 %
HCT: 37.6 % (ref 36.0–46.0)
Hemoglobin: 12 g/dL (ref 12.0–15.0)
Immature Granulocytes: 1 %
Lymphocytes Relative: 13 %
Lymphs Abs: 0.9 10*3/uL (ref 0.7–4.0)
MCH: 29.9 pg (ref 26.0–34.0)
MCHC: 31.9 g/dL (ref 30.0–36.0)
MCV: 93.5 fL (ref 80.0–100.0)
Monocytes Absolute: 0.1 10*3/uL (ref 0.1–1.0)
Monocytes Relative: 2 %
Neutro Abs: 6.4 10*3/uL (ref 1.7–7.7)
Neutrophils Relative %: 84 %
Platelet Count: 257 10*3/uL (ref 150–400)
RBC: 4.02 MIL/uL (ref 3.87–5.11)
RDW: 18.3 % — ABNORMAL HIGH (ref 11.5–15.5)
WBC Count: 7.5 10*3/uL (ref 4.0–10.5)
nRBC: 0 % (ref 0.0–0.2)

## 2020-09-28 LAB — CMP (CANCER CENTER ONLY)
ALT: 32 U/L (ref 0–44)
AST: 15 U/L (ref 15–41)
Albumin: 3.7 g/dL (ref 3.5–5.0)
Alkaline Phosphatase: 69 U/L (ref 38–126)
Anion gap: 10 (ref 5–15)
BUN: 10 mg/dL (ref 6–20)
CO2: 21 mmol/L — ABNORMAL LOW (ref 22–32)
Calcium: 8.7 mg/dL — ABNORMAL LOW (ref 8.9–10.3)
Chloride: 109 mmol/L (ref 98–111)
Creatinine: 0.76 mg/dL (ref 0.44–1.00)
GFR, Estimated: >60 mL/min (ref >60)
Glucose, Bld: 155 mg/dL — ABNORMAL HIGH (ref 70–99)
Potassium: 4.3 mmol/L (ref 3.5–5.1)
Sodium: 140 mmol/L (ref 135–145)
Total Bilirubin: <0.2 mg/dL — ABNORMAL LOW (ref 0.3–1.2)
Total Protein: 6.6 g/dL (ref 6.5–8.1)

## 2020-09-28 NOTE — Progress Notes (Signed)
[place] CSW Progress Note  [employer] representative met with patient to provide last set of [employer]. [place] application has been received by the foundation, they will update CSW and pt regarding assistance decision.    Christeen Douglas , LCSW

## 2020-09-28 NOTE — Progress Notes (Signed)
[place]  [phone] [phone]     ID: Sheryl Mcmillan DOB: [birth year]  MR#: [id number]  KVQ#:[id number]  Patient Care Team: Patient, No Pcp Per (Inactive) as PCP - General (General Practice) Sheryl Kaufmann, RN as Oncology Nurse Navigator Sheryl Germany, RN as Oncology Nurse Navigator Sheryl Bookbinder, MD as Consulting Physician (General Surgery) Sheryl Mcmillan, Sheryl Dad, MD as Consulting Physician (Oncology) Sheryl Pray, MD as Consulting Physician (Radiation Oncology) Sheryl Presume, MD as Consulting Physician (Plastic Surgery) Sheryl Cruel, MD OTHER MD:  CHIEF COMPLAINT: estrogen receptor negative, Her2 positive breast cancer  CURRENT TREATMENT: Neoadjuvant chemotherapy   INTERVAL HISTORY: Sheryl Mcmillan returns today for follow up and treatment of her estrogen receptor negative, Her2 positive breast cancer accompanied by her daughter.  Sheryl Mcmillan started neoadjuvant chemotherapy on 06/16/2020.  She receives docetaxel, Carboplatin, Trastuzumab, and docetaxel every 21 days with udenyca on day 3 for growth factor support.  Today is cycle 6 day 1, her last chemotherapy treatment.    Since her last visit, she underwent repeat echocardiogram on 09/14/2020 showing an ejection fraction of 65-70%. This is stable from prior in 05/2020.  She is scheduled for post-treatment breast MRI on 10/05/2020.   REVIEW OF SYSTEMS: Sheryl Mcmillan did remarkably well with her treatments.  She did have problems with diarrhea and the Pertuzumab had to be stopped.  She still has occasional loose bowel movements after treatments.  She has had a headache on and off for the last few days.  She thinks this may be stress or sinus issues.  She is wondering if she will need radiation if she has a mastectomy.  A detailed review of systems today was otherwise stable    COVID 19 VACCINATION STATUS: Status post vaccine x2, no booster as of February 2022   HISTORY OF CURRENT ILLNESS: From the  original intake note:   Sheryl Mcmillan presented with a palpable lump in the outer left breast, which she initially felt approximately 6 months prior. She waited to seek medical attention because she had no insurance.  Eventually she enrolled in the [facility] program and underwent bilateral diagnostic mammography with tomography and left breast ultrasonography at [facility] on 05/10/2020 showing: breast density category B; vaguely palpable 2.5 cm mass in left breast at 1:30; no pathologic left axillary lymphadenopathy or evidence of right breast malignancy.  Accordingly on 05/18/2020 she proceeded to biopsy of the left breast area in question. The pathology from this procedure ([id number]) showed: invasive ductal carcinoma, grade 3. Prognostic indicators significant for: estrogen receptor, 40% positive with weak staining intensity and progesterone receptor, 0% negative. Proliferation marker Ki67 at 50%. HER2 positive by immunohistochemistry (3+).  The patient's subsequent history is as detailed below.   PAST MEDICAL HISTORY: Past Medical History:  Diagnosis Date  . Arthritis   . Breast cancer ([place])   . Family history of non-Hodgkin's lymphoma 06/01/2020  . Hemorrhoids   . Migraines   . Obesity   . PCOS (polycystic ovarian syndrome)     PAST SURGICAL HISTORY: Past Surgical History:  Procedure Laterality Date  . COSMETIC SURGERY    . DILATION AND CURETTAGE OF UTERUS    . HEMORRHOID SURGERY    . IR IMAGING GUIDED PORT INSERTION  06/15/2020    FAMILY HISTORY: Family History  Problem Relation Age of Onset  . Hypertension Mother   . Diabetes Mother   . Cancer Mother 39       unknown primary  . Other Mother  brain tumor; dx 62s  . Non-Hodgkin's lymphoma Brother 63  . Other Maternal Uncle 52       brain tumor  . Breast cancer Other        MGM's niece; dx late 8s  . Pancreatic cancer Neg Hx   . Colon cancer Neg Hx   . Endometrial cancer Neg Hx   . Prostate cancer Neg Hx    . Ovarian cancer Neg Hx    She has no information on her father. Her mother died at age 30 from stage IV cancer. The primary cancer was unknown, but it was not breast.  [name] has two half brothers (through her mother), one of which was diagnosed with non-Hodgkin's lymphoma. There is no family history of breast, ovarian, or prostate cancer to her knowledge. // Patient is BRCA1 positive. Daughter tested negative.   GYNECOLOGIC HISTORY:  No LMP recorded. Menarche: 45 years old Age at first live birth: 45 years old [place] P 1 LMP 05/02/2020 Contraceptive: never used HRT n/a  Hysterectomy? no BSO? 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 She is single. Her significant other Sheryl Mcmillan is a Freight forwarder. She lives at home with Sheryl Mcmillan, her daughter Sheryl Mcmillan,  [name]'s daughter Sheryl Mcmillan and her 37-monthold daughter Sheryl Mcmillan Daughter [name] age 44 is a bChief Operating Officerat [employer] [place]    ADVANCED DIRECTIVES: Completed and notarized 09/07/2020.  She has named her aunt Sheryl Reeksas healthcare power of attorney   HEALTH MAINTENANCE: Social History   Tobacco Use  . Smoking status: Current Every Day Smoker    Packs/day: 0.50    Years: 10.00    Pack years: 5.00    Types: Cigarettes  . Smokeless tobacco: Never Used  Vaping Use  . Vaping Use: Never used  Substance Use Topics  . Alcohol use: Yes    Comment: occasionally  . Drug use: Yes    Types: Marijuana     Colonoscopy: 2018  PAP: 2018  Bone density: n/a (age)   Allergies  Allergen Reactions  . Crest Tc-Baking Soda [Sodium Fluoride] [employer] . Carboplatin Hives and Itching    Current Outpatient Medications  Medication Sig Dispense Refill  . dexamethasone (DECADRON) 4 MG tablet TAKE 2 TABLETS BY MOUTH 2 TIMES DAILY THE DAY BEFORE TAXOTERE. THEN TAKE 2 TABS DAILY FOR THREE DAYS AFTER  CHEMOTHERAPY AS DIRECTED 30 tablet 1  . dibucaine (NUPERCAINAL) 1 % O[place] 1 APPLICATION RECTALLY AS NEEDED FOR HEMORRHOIDS. 28 g 1  . diphenoxylate-atropine (LOMOTIL) 2.5-0.025 MG tablet TAKE 1 TO 2 TABLETS BY MOUTH 4 TIMES DAILY AS NEEDED FOR DIARRHEA 30 tablet 2  . fluconazole (DIFLUCAN) 200 MG tablet TAKE 1 TABLET BY MOUTH ONCE A DAY 7 tablet 0  . hydrocortisone (ANUSOL-HC) 2.5 % rectal cream APPLY RECTALLY TWICE A DAY 30 g 0  . hydrocortisone-pramoxine (ANALPRAM-HC) 2.5-1 % rectal cream APPLY RECTALLY 2 TIMES DAILY AS DIRECTED 30 g 0  . lidocaine-prilocaine (EMLA) cream APPLY TO AFFECTED AREA ONCE 30 g 3  . LORazepam (ATIVAN) 0.5 MG tablet DISSOLVE 1 TABLET UNDER THE TONGUE EVERY 6 HOURS AS NEEDED FOR ANXIETY 30 tablet 0  . pramoxine-hydrocortisone (PROCTOCREAM-HC) 1-1 % rectal cream Place 1 application rectally 2 (two) times daily. 30 g 0  . prochlorperazine (COMPAZINE) 10 MG tablet TAKE 1 TABLET BY MOUTH EVERY 6 HOURS AS NEEDED FOR NAUSEA  OR VOMITING 30 tablet 1   No current facility-administered medications for this visit.   Facility-Administered Medications Ordered in Other Visits  Medication Dose Route Frequency Provider Last Rate Last Admin  . sodium chloride flush (NS) 0.9 % injection 10 mL  10 mL Intravenous PRN Causey, Charlestine Massed, NP        OBJECTIVE: White woman who appears stated age Vitals:   09/28/20 1228  BP: (!) 135/93  Pulse: (!) 104  Resp: 20  Temp: 97.7 F (36.5 C)  SpO2: 98%   Body mass index is 43.14 kg/m. Filed Weights   09/28/20 1228  Weight: (!) 309 lb 4.8 oz (140.3 kg)    Sclerae unicteric, EOMs intact Wearing a mask No cervical or supraclavicular adenopathy Lungs no rales or rhonchi Heart regular rate and rhythm Abd soft, nontender, positive bowel sounds MSK no focal spinal tenderness, no upper extremity lymphedema Neuro: nonfocal, well oriented, appropriate affect Breasts: I do not palpate a mass in the left breast.  There are no  skin or nipple changes of concern.   LAB RESULTS:  CMP     Component Value Date/Time   NA 136 09/07/2020 1250   K 4.5 09/07/2020 1250   CL 107 09/07/2020 1250   CO2 23 09/07/2020 1250   GLUCOSE 159 (H) 09/07/2020 1250   BUN 9 09/07/2020 1250   CREATININE 0.84 09/07/2020 1250   CALCIUM 9.1 09/07/2020 1250   PROT 7.0 09/07/2020 1250   ALBUMIN 3.9 09/07/2020 1250   AST 18 09/07/2020 1250   ALT 30 09/07/2020 1250   ALKPHOS 78 09/07/2020 1250   BILITOT 0.2 (L) 09/07/2020 1250   GFRNONAA >60 09/07/2020 1250   GFRAA >60 06/06/2019 1205    No results found for: TOTALPROTELP, ALBUMINELP, A1GS, A2GS, BETS, BETA2SER, GAMS, MSPIKE, SPEI  Lab Results  Component Value Date   WBC 7.5 09/28/2020   NEUTROABS 6.4 09/28/2020   HGB 12.0 09/28/2020   HCT 37.6 09/28/2020   MCV 93.5 09/28/2020   PLT 257 09/28/2020    No results found for: LABCA2  No components found for: BHALPF790  No results for input(s): INR in the last 168 hours.  No results found for: LABCA2  No results found for: CAN199  Lab Results  Component Value Date   CAN125 10.2 07/28/2020    No results found for: WIO973  No results found for: CA2729  No components found for: HGQUANT  No results found for: CEA1 / No results found for: CEA1   No results found for: AFPTUMOR  No results found for: CHROMOGRNA  No results found for: KPAFRELGTCHN, LAMBDASER, KAPLAMBRATIO (kappa/lambda light chains)  No results found for: HGBA, HGBA2QUANT, HGBFQUANT, HGBSQUAN (Hemoglobinopathy evaluation)   No results found for: LDH  No results found for: IRON, TIBC, IRONPCTSAT (Iron and TIBC)  No results found for: FERRITIN  Urinalysis    Component Value Date/Time   COLORURINE YELLOW 06/06/2019 [place] 06/06/2019 1205   LABSPEC >1.030 (H) 06/06/2019 1205   PHURINE 5.5 06/06/2019 1205   GLUCOSEU NEGATIVE 06/06/2019 1205   HGBUR TRACE (A) 06/06/2019 1205   BILIRUBINUR NEGATIVE 06/06/2019 [place] 06/06/2019 [place] 06/06/2019 1205   UROBILINOGEN 1.0 05/09/2011 1805   NITRITE NEGATIVE 06/06/2019 1205   LEUKOCYTESUR NEGATIVE 06/06/2019 1205    STUDIES: ECHOCARDIOGRAM COMPLETE  Result Date: 09/14/2020    ECHOCARDIOGRAM REPORT   Patient Name:   ROSHELLE TRAUB Date of Exam: 09/14/2020 Medical Rec #:  [id number]       Height:       71.0 in Accession #:    [id number]      Weight:       306.7 lb Date of Birth:  01-Apr-1976        BSA:          2.529 m Patient Age:    65 years        BP:           107/66 mmHg Patient Gender: F               HR:           96 bpm. Exam Location:  Outpatient Procedure: 2D Echo, 3D Echo, Cardiac Doppler, Color Doppler and Strain Analysis Indications:    Z51.11 Encounter for antineoplastic chemotheraphy  History:        Patient has prior history of Echocardiogram examinations, most                 recent 06/09/2020.  Sonographer:    Jonelle Sidle Dance Referring Phys: [place]  1. Left ventricular ejection fraction, by estimation, is 65 to 70%. The left ventricle has normal function. The left ventricle has no regional wall motion abnormalities. Left ventricular diastolic parameters are indeterminate.  2. Right ventricular systolic function is normal. The right ventricular size is normal.  3. The mitral valve is grossly normal. No evidence of mitral valve regurgitation.  4. The aortic valve was not well visualized. Aortic valve regurgitation is not visualized. FINDINGS  Left Ventricle: Left ventricular ejection fraction, by estimation, is 65 to 70%. The left ventricle has normal function. The left ventricle has no regional wall motion abnormalities. Global longitudinal strain performed but not reported based on interpreter judgement due to suboptimal tracking. 3D left ventricular ejection fraction analysis performed but not reported based on interpreter judgement due to suboptimal quality. The left ventricular internal cavity  size was normal in size. There is no left ventricular hypertrophy. Left ventricular diastolic parameters are indeterminate. Right Ventricle: The right ventricular size is normal. No increase in right ventricular wall thickness. Right ventricular systolic function is normal. Left Atrium: Left atrial size was normal in size. Right Atrium: Right atrial size was normal in size. Pericardium: There is no evidence of pericardial effusion. Mitral Valve: The mitral valve is grossly normal. No evidence of mitral valve regurgitation. Tricuspid Valve: The tricuspid valve is grossly normal. Tricuspid valve regurgitation is not demonstrated. Aortic Valve: The aortic valve was not well visualized. Aortic valve regurgitation is not visualized. Pulmonic Valve: The pulmonic valve was grossly normal. Pulmonic valve regurgitation is not visualized. Aorta: The aortic root and ascending aorta are structurally normal, with no evidence of dilitation. IAS/Shunts: The atrial septum is grossly normal.  LEFT VENTRICLE PLAX 2D LVIDd:         4.20 cm  Diastology LVIDs:         3.30 cm  LV e' lateral:   7.51 cm/s LV PW:         1.20 cm  LV E/e' lateral: 8.3 LV IVS:        1.20 cm LVOT diam:     1.90 cm LV SV:         69 LV SV Index:   27 LVOT Area:     2.84 cm 3D Volume EF:  3D EF:        49 % RIGHT VENTRICLE             IVC RV Basal diam:  2.30 cm     IVC diam: 2.20 cm RV S prime:     16.80 cm/s TAPSE (M-mode): 2.1 cm LEFT ATRIUM             Index       RIGHT ATRIUM           Index LA diam:        4.90 cm 1.94 cm/m  RA Area:     13.00 cm LA Vol (A2C):   37.8 ml 14.95 ml/m RA Volume:   26.40 ml  10.44 ml/m LA Vol (A4C):   23.4 ml 9.25 ml/m LA Biplane Vol: 31.9 ml 12.62 ml/m  AORTIC VALVE LVOT Vmax:   151.00 cm/s LVOT Vmean:  113.000 cm/s LVOT VTI:    0.245 m  AORTA Ao Root diam: 3.50 cm Ao Asc diam:  3.30 cm MITRAL VALVE MV Area (PHT): 3.37 cm    SHUNTS MV Decel Time: 225 msec    Systemic VTI:  0.24 m MV E velocity:  62.10 cm/s  Systemic Diam: 1.90 cm MV A velocity: 79.10 cm/s MV E/A ratio:  0.79 Mertie Moores MD Electronically signed by Mertie Moores MD Signature Date/Time: 09/14/2020/4:00:31 PM    Final      ELIGIBLE FOR AVAILABLE RESEARCH PROTOCOL: AET  ASSESSMENT: 45 y.o. BRCA positive [place] woman status post left breast upper outer quadrant biopsy 05/18/2020 for a clinical T2N0, stage IIA invasive ductal carcinoma, grade 3, estrogen receptor weakly positive, progesterone receptor negative, with an MIB-1 of 50%, and HER-2 positive by immunohistochemistry  (1) genetics testing 06/16/2020 shows a pathogenic mutation in BRCA1 at c.2475del (p.Asp825Glufs*21)  (a)  Variant of uncertain significance in MSH3 at c.2724A>G (Silent).  (b) no additional deleterious mutations through the Multi-Cancer Panel offered by [facility] including AIP, ALK, APC, ATM, AXIN2,BAP1,  BARD1, BLM, BMPR1A, BRCA1, BRCA2, BRIP1, CASR, CDC73, CDH1, CDK4, CDKN1B, CDKN1C, CDKN2A (p14ARF), CDKN2A (p16INK4a), CEBPA, CHEK2, CTNNA1, DICER1, DIS3L2, EGFR (c.2369C>T, p.Thr790Met variant only), EPCAM (Deletion/duplication testing only), FH, FLCN, GATA2, GPC3, GREM1 (Promoter region deletion/duplication testing only), HOXB13 (c.251G>A, p.Gly84Glu), HRAS, KIT, MAX, MEN1, MET, MITF (c.952G>A, p.Glu318Lys variant only), MLH1, MSH2, MSH3, MSH6, MUTYH, NBN, NF1, NF2, NTHL1, PALB2, PDGFRA, PHOX2B, PMS2, POLD1, POLE, POT1, PRKAR1A, PTCH1, PTEN, RAD50, RAD51C, RAD51D, RB1, RECQL4, RET, RNF43, RUNX1, SDHAF2, SDHA (sequence changes only), SDHB, SDHC, SDHD, SMAD4, SMARCA4, SMARCB1, SMARCE1, STK11, SUFU, TERC, TERT, TMEM127, TP53, TSC1, TSC2, VHL, WRN and WT1.   (2) neoadjuvant chemotherapy to consist of carboplatin and docetaxel together with trastuzumab and Pertuzumab every 3 weeks x 6, starting 06/16/2020, completed 09/28/2020   (a) pertuzumab omitted after cycle 1  (3) trastuzumab to be continued to complete 1 year  (a) echo 06/09/2020 shows EF in the  60-65% range  (b) echo 09/14/2020 shows an ejection fraction in 65-70% range.  (4) definitive surgery to follow  (5) adjuvant radiation as appropriate  (6) antiestrogens  (7) olaparib   PLAN:  Nerine completes chemotherapy today.  She is still having some difficulty disentangle in the chemotherapy from the anti-HER-2 immunotherapy and we worked on that today.  She understands once she starts on the immunotherapy alone she will not need the booster shots or the antinausea medicines or Decadron.  Herceptin does not affect hair growth or cause problems with nausea.  She had a possible reaction to  carboplatin last time.  She only received three fourths of the dose.  I have increased her steroids Benadryl and added Pepcid to the premeds today but if she has any problems at all we will simply eliminate the final dose of carboplatinum  She is already scheduled for post neoadjuvant breast MRI 10/03/2020 and she will see her surgeon 10/11/2020 2 plan the definitive surgery.  She is considering bilateral mastectomies.  She is going to see me 10/20/2020.  She will start trastuzumab that day.  We will continue to complete a year, approximately 9 more months of Herceptin treatment.  She will be due for repeat echocardiogram in June  Total encounter time 35 minutes.Sheryl Jews C. Carlitos Bottino, MD 09/28/20 12:45 PM Medical Oncology and Hematology [facility] [place], [place] [address] Tel. [phone]    Fax. [phone]   I, Sheryl Mcmillan, am acting as scribe for Dr. Virgie Mcmillan. Sheryl Mcmillan.  I, Sheryl Del MD, have reviewed the above documentation for accuracy and completeness, and I agree with the above.   *Total Encounter Time as defined by the Centers for Medicare and Medicaid Services includes, in addition to the face-to-face time of a patient visit (documented in the note above) non-face-to-face time: obtaining and reviewing outside history, ordering and  reviewing medications, tests or procedures, care coordination (communications with other health care professionals or caregivers) and documentation in the medical record.

## 2020-09-29 ENCOUNTER — Encounter: Payer: Self-pay | Admitting: *Deleted

## 2020-09-29 ENCOUNTER — Ambulatory Visit: Payer: Medicaid Other | Admitting: Adult Health

## 2020-09-29 ENCOUNTER — Inpatient Hospital Stay: Payer: Medicaid Other

## 2020-09-29 ENCOUNTER — Other Ambulatory Visit: Payer: Medicaid Other

## 2020-09-29 ENCOUNTER — Ambulatory Visit: Payer: Medicaid Other

## 2020-09-29 ENCOUNTER — Telehealth: Payer: Self-pay | Admitting: Oncology

## 2020-09-29 VITALS — BP 122/69 | HR 95 | Temp 98.5°F | Resp 17

## 2020-09-29 DIAGNOSIS — Z170 Estrogen receptor positive status [ER+]: Secondary | ICD-10-CM

## 2020-09-29 DIAGNOSIS — Z5111 Encounter for antineoplastic chemotherapy: Secondary | ICD-10-CM | POA: Diagnosis not present

## 2020-09-29 MED ORDER — FAMOTIDINE IN NACL 20-0.9 MG/50ML-% IV SOLN
20.0000 mg | Freq: Once | INTRAVENOUS | Status: AC
  Administered 2020-09-29: 20 mg via INTRAVENOUS

## 2020-09-29 MED ORDER — DIPHENHYDRAMINE HCL 25 MG PO CAPS
50.0000 mg | ORAL_CAPSULE | Freq: Once | ORAL | Status: AC
  Administered 2020-09-29: 50 mg via ORAL

## 2020-09-29 MED ORDER — SODIUM CHLORIDE 0.9 % IV SOLN
75.0000 mg/m2 | Freq: Once | INTRAVENOUS | Status: AC
  Administered 2020-09-29: 200 mg via INTRAVENOUS
  Filled 2020-09-29: qty 20

## 2020-09-29 MED ORDER — FAMOTIDINE IN NACL 20-0.9 MG/50ML-% IV SOLN
INTRAVENOUS | Status: AC
  Filled 2020-09-29: qty 50

## 2020-09-29 MED ORDER — HEPARIN SOD (PORK) LOCK FLUSH 100 UNIT/ML IV SOLN
500.0000 [IU] | Freq: Once | INTRAVENOUS | Status: AC | PRN
  Administered 2020-09-29: 500 [IU]
  Filled 2020-09-29: qty 5

## 2020-09-29 MED ORDER — SODIUM CHLORIDE 0.9 % IV SOLN
150.0000 mg | Freq: Once | INTRAVENOUS | Status: AC
  Administered 2020-09-29: 150 mg via INTRAVENOUS
  Filled 2020-09-29: qty 150

## 2020-09-29 MED ORDER — SODIUM CHLORIDE 0.9 % IV SOLN
750.0000 mg | Freq: Once | INTRAVENOUS | Status: AC
  Administered 2020-09-29: 750 mg via INTRAVENOUS
  Filled 2020-09-29: qty 75

## 2020-09-29 MED ORDER — METHYLPREDNISOLONE SODIUM SUCC 125 MG IJ SOLR
125.0000 mg | Freq: Once | INTRAMUSCULAR | Status: AC | PRN
  Administered 2020-09-29: 125 mg via INTRAVENOUS

## 2020-09-29 MED ORDER — DIPHENHYDRAMINE HCL 50 MG/ML IJ SOLN
50.0000 mg | Freq: Once | INTRAMUSCULAR | Status: AC | PRN
  Administered 2020-09-29: 50 mg via INTRAVENOUS

## 2020-09-29 MED ORDER — ACETAMINOPHEN 325 MG PO TABS
ORAL_TABLET | ORAL | Status: AC
  Filled 2020-09-29: qty 2

## 2020-09-29 MED ORDER — SODIUM CHLORIDE 0.9 % IV SOLN
Freq: Once | INTRAVENOUS | Status: AC
Start: 2020-09-29 — End: 2020-09-29
  Filled 2020-09-29: qty 250

## 2020-09-29 MED ORDER — ACETAMINOPHEN 325 MG PO TABS
650.0000 mg | ORAL_TABLET | Freq: Once | ORAL | Status: AC
  Administered 2020-09-29: 650 mg via ORAL

## 2020-09-29 MED ORDER — SODIUM CHLORIDE 0.9% FLUSH
10.0000 mL | INTRAVENOUS | Status: DC | PRN
  Administered 2020-09-29: 10 mL
  Filled 2020-09-29: qty 10

## 2020-09-29 MED ORDER — TRASTUZUMAB-DKST CHEMO 150 MG IV SOLR
6.0000 mg/kg | Freq: Once | INTRAVENOUS | Status: AC
  Administered 2020-09-29: 840 mg via INTRAVENOUS
  Filled 2020-09-29: qty 40

## 2020-09-29 MED ORDER — PALONOSETRON HCL INJECTION 0.25 MG/5ML
0.2500 mg | Freq: Once | INTRAVENOUS | Status: AC
  Administered 2020-09-29: 0.25 mg via INTRAVENOUS

## 2020-09-29 MED ORDER — SODIUM CHLORIDE 0.9 % IV SOLN
20.0000 mg | Freq: Once | INTRAVENOUS | Status: AC
  Administered 2020-09-29: 20 mg via INTRAVENOUS
  Filled 2020-09-29: qty 2

## 2020-09-29 MED ORDER — DIPHENHYDRAMINE HCL 25 MG PO CAPS
ORAL_CAPSULE | ORAL | Status: AC
  Filled 2020-09-29: qty 2

## 2020-09-29 MED ORDER — PALONOSETRON HCL INJECTION 0.25 MG/5ML
INTRAVENOUS | Status: AC
  Filled 2020-09-29: qty 5

## 2020-09-29 NOTE — Telephone Encounter (Signed)
Scheduled appts per 4/6 los. Pt aware.  

## 2020-09-29 NOTE — Progress Notes (Signed)
1230: Pt c/o itching to "entire body" Carboplatin infusion stopped and vitals obtained; refer to [facility].  1232: Pt medicated; refer to [facility].  1235: Dr. Jana Hakim and his nurse Ilda Foil, RN aware of pt reaction.   1240: Pt continuing to itch all over her body. Respirations equal and unlabored. Skin warm and dry. Pt denies any other complaints except for itching.  1246: Vitals stable. Pt still unable to not itch all over her body. MD aware.   1250: Pt sitting in chair and able to not itch her body. Pt states "its easing off" Pt skin warm and dry. Respirations equal and unlabored. Pt denies any other complaints.  1253: Per Dr. Jana Hakim no need to re challenge Carboplatin. Pt aware of plan to discharge home and agreeable to plan. Pt denies any complaints. Pt talking on phone in no apparent distress.  1300: Pt states she is ready to go home. Pt aware to call clinic with any questions or concerns. Pt denied any other complaints. Pt denies any itching.

## 2020-09-29 NOTE — Patient Instructions (Signed)
[place] Discharge Instructions for Patients Receiving Chemotherapy  Today you received the following chemotherapy agents Carboplatin, Taxotere, Ogivri,   To help prevent nausea and vomiting after your treatment, we encourage you to take your nausea medication as directed If you develop nausea and vomiting that is not controlled by your nausea medication, call the clinic.   BELOW ARE SYMPTOMS THAT SHOULD BE REPORTED IMMEDIATELY:  *FEVER GREATER THAN 100.5 F  *CHILLS WITH OR WITHOUT FEVER  NAUSEA AND VOMITING THAT IS NOT CONTROLLED WITH YOUR NAUSEA MEDICATION  *UNUSUAL SHORTNESS OF BREATH  *UNUSUAL BRUISING OR BLEEDING  TENDERNESS IN MOUTH AND THROAT WITH OR WITHOUT PRESENCE OF ULCERS  *URINARY PROBLEMS  *BOWEL PROBLEMS  UNUSUAL RASH Items with * indicate a potential emergency and should be followed up as soon as possible.  Feel free to call the clinic should you have any questions or concerns. The clinic phone number is [phone].  Please show the [facility] at check-in to the Emergency Department and triage nurse.

## 2020-09-30 ENCOUNTER — Inpatient Hospital Stay: Payer: Medicaid Other

## 2020-09-30 ENCOUNTER — Other Ambulatory Visit: Payer: Self-pay

## 2020-09-30 VITALS — BP 121/76 | HR 87 | Temp 97.4°F | Resp 17

## 2020-09-30 DIAGNOSIS — Z170 Estrogen receptor positive status [ER+]: Secondary | ICD-10-CM

## 2020-09-30 DIAGNOSIS — C50412 Malignant neoplasm of upper-outer quadrant of left female breast: Secondary | ICD-10-CM

## 2020-09-30 DIAGNOSIS — Z5111 Encounter for antineoplastic chemotherapy: Secondary | ICD-10-CM | POA: Diagnosis not present

## 2020-09-30 MED ORDER — PEGFILGRASTIM-CBQV 6 MG/0.6ML ~~LOC~~ SOSY
6.0000 mg | PREFILLED_SYRINGE | Freq: Once | SUBCUTANEOUS | Status: AC
  Administered 2020-09-30: 6 mg via SUBCUTANEOUS

## 2020-09-30 MED ORDER — PEGFILGRASTIM-CBQV 6 MG/0.6ML ~~LOC~~ SOSY
PREFILLED_SYRINGE | SUBCUTANEOUS | Status: AC
  Filled 2020-09-30: qty 0.6

## 2020-09-30 NOTE — Patient Instructions (Signed)
Pegfilgrastim injection What is this medicine? PEGFILGRASTIM (PEG fil gra stim) is a long-acting granulocyte colony-stimulating factor that stimulates the growth of neutrophils, a type of white blood cell important in the body's fight against infection. It is used to reduce the incidence of fever and infection in patients with certain types of cancer who are receiving chemotherapy that affects the bone marrow, and to increase survival after being exposed to high doses of radiation. This medicine may be used for other purposes; ask your health care provider or pharmacist if you have questions. COMMON BRAND NAME(S): Fulphila, Neulasta, Nyvepria, UDENYCA, Ziextenzo What should I tell my health care provider before I take this medicine? They need to know if you have any of these conditions:  kidney disease  latex allergy  ongoing radiation therapy  sickle cell disease  skin reactions to acrylic adhesives (On-Body Injector only)  an unusual or allergic reaction to pegfilgrastim, filgrastim, other medicines, foods, dyes, or preservatives  pregnant or trying to get pregnant  breast-feeding How should I use this medicine? This medicine is for injection under the skin. If you get this medicine at home, you will be taught how to prepare and give the pre-filled syringe or how to use the On-body Injector. Refer to the patient Instructions for Use for detailed instructions. Use exactly as directed. Tell your healthcare provider immediately if you suspect that the On-body Injector may not have performed as intended or if you suspect the use of the On-body Injector resulted in a missed or partial dose. It is important that you put your used needles and syringes in a special sharps container. Do not put them in a trash can. If you do not have a sharps container, call your pharmacist or healthcare provider to get one. Talk to your pediatrician regarding the use of this medicine in children. While this drug  may be prescribed for selected conditions, precautions do apply. Overdosage: If you think you have taken too much of this medicine contact a poison control center or emergency room at once. NOTE: This medicine is only for you. Do not share this medicine with others. What if I miss a dose? It is important not to miss your dose. Call your doctor or health care professional if you miss your dose. If you miss a dose due to an On-body Injector failure or leakage, a new dose should be administered as soon as possible using a single prefilled syringe for manual use. What may interact with this medicine? Interactions have not been studied. This list may not describe all possible interactions. Give your health care provider a list of all the medicines, herbs, non-prescription drugs, or dietary supplements you use. Also tell them if you smoke, drink alcohol, or use illegal drugs. Some items may interact with your medicine. What should I watch for while using this medicine? Your condition will be monitored carefully while you are receiving this medicine. You may need blood work done while you are taking this medicine. Talk to your health care provider about your risk of cancer. You may be more at risk for certain types of cancer if you take this medicine. If you are going to need a MRI, CT scan, or other procedure, tell your doctor that you are using this medicine (On-Body Injector only). What side effects may I notice from receiving this medicine? Side effects that you should report to your doctor or health care professional as soon as possible:  allergic reactions (skin rash, itching or hives, swelling of   the face, lips, or tongue)  back pain  dizziness  fever  pain, redness, or irritation at site where injected  pinpoint red spots on the skin  red or dark-brown urine  shortness of breath or breathing problems  stomach or side pain, or pain at the shoulder  swelling  tiredness  trouble  passing urine or change in the amount of urine  unusual bruising or bleeding Side effects that usually do not require medical attention (report to your doctor or health care professional if they continue or are bothersome):  bone pain  muscle pain This list may not describe all possible side effects. Call your doctor for medical advice about side effects. You may report side effects to FDA at 1-800-FDA-1088. Where should I keep my medicine? Keep out of the reach of children. If you are using this medicine at home, you will be instructed on how to store it. Throw away any unused medicine after the expiration date on the label. NOTE: This sheet is a summary. It may not cover all possible information. If you have questions about this medicine, talk to your doctor, pharmacist, or health care provider.  2021 Elsevier/Gold Standard (2019-07-03 13:20:51)  

## 2020-10-01 ENCOUNTER — Ambulatory Visit: Payer: Medicaid Other

## 2020-10-03 ENCOUNTER — Inpatient Hospital Stay: Payer: Medicaid Other

## 2020-10-03 ENCOUNTER — Other Ambulatory Visit: Payer: Self-pay

## 2020-10-03 VITALS — BP 123/84 | HR 75 | Temp 97.8°F | Resp 17

## 2020-10-03 DIAGNOSIS — Z95828 Presence of other vascular implants and grafts: Secondary | ICD-10-CM

## 2020-10-03 DIAGNOSIS — Z5111 Encounter for antineoplastic chemotherapy: Secondary | ICD-10-CM | POA: Diagnosis not present

## 2020-10-03 MED ORDER — SODIUM CHLORIDE 0.9 % IV SOLN
INTRAVENOUS | Status: AC
  Filled 2020-10-03 (×2): qty 250

## 2020-10-03 MED ORDER — ONDANSETRON HCL 4 MG/2ML IJ SOLN
8.0000 mg | Freq: Once | INTRAMUSCULAR | Status: AC
  Administered 2020-10-03: 8 mg via INTRAVENOUS

## 2020-10-03 MED ORDER — ONDANSETRON HCL 4 MG/2ML IJ SOLN
INTRAMUSCULAR | Status: AC
  Filled 2020-10-03: qty 4

## 2020-10-03 NOTE — Patient Instructions (Signed)
Dehydration, Adult Dehydration is condition in which there is not enough water or other fluids in the body. This happens when a person loses more fluids than he or she takes in. Important body parts cannot work right without the right amount of fluids. Any loss of fluids from the body can cause dehydration. Dehydration can be mild, worse, or very bad. It should be treated right away to keep it from getting very bad. What are the causes? This condition may be caused by:  Conditions that cause loss of water or other fluids, such as: ? Watery poop (diarrhea). ? Vomiting. ? Sweating a lot. ? Peeing (urinating) a lot.  Not drinking enough fluids, especially when you: ? Are ill. ? Are doing things that take a lot of energy to do.  Other illnesses and conditions, such as fever or infection.  Certain medicines, such as medicines that take extra fluid out of the body (diuretics).  Lack of safe drinking water.  Not being able to get enough water and food. What increases the risk? The following factors may make you more likely to develop this condition:  Having a long-term (chronic) illness that has not been treated the right way, such as: ? Diabetes. ? Heart disease. ? Kidney disease.  Being 65 years of age or older.  Having a disability.  Living in a place that is high above the ground or sea (high in altitude). The thinner, dried air causes more fluid loss.  Doing exercises that put stress on your body for a long time. What are the signs or symptoms? Symptoms of dehydration depend on how bad it is. Mild or worse dehydration  Thirst.  Dry lips or dry mouth.  Feeling dizzy or light-headed, especially when you stand up from sitting.  Muscle cramps.  Your body making: ? Dark pee (urine). Pee may be the color of tea. ? Less pee than normal. ? Less tears than normal.  Headache. Very bad dehydration  Changes in skin. Skin may: ? Be cold to the touch (clammy). ? Be blotchy  or pale. ? Not go back to normal right after you lightly pinch it and let it go.  Little or no tears, pee, or sweat.  Changes in vital signs, such as: ? Fast breathing. ? Low blood pressure. ? Weak pulse. ? Pulse that is more than 100 beats a minute when you are sitting still.  Other changes, such as: ? Feeling very thirsty. ? Eyes that look hollow (sunken). ? Cold hands and feet. ? Being mixed up (confused). ? Being very tired (lethargic) or having trouble waking from sleep. ? Short-term weight loss. ? Loss of consciousness. How is this treated? Treatment for this condition depends on how bad it is. Treatment should start right away. Do not wait until your condition gets very bad. Very bad dehydration is an emergency. You will need to go to a hospital.  Mild or worse dehydration can be treated at home. You may be asked to: ? Drink more fluids. ? Drink an oral rehydration solution (ORS). This drink helps get the right amounts of fluids and salts and minerals in the blood (electrolytes).  Very bad dehydration can be treated: ? With fluids through an IV tube. ? By getting normal levels of salts and minerals in your blood. This is often done by giving salts and minerals through a tube. The tube is passed through your nose and into your stomach. ? By treating the root cause. Follow these instructions at   home: Oral rehydration solution If told by your doctor, drink an ORS:  Make an ORS. Use instructions on the package.  Start by drinking small amounts, about  cup (120 mL) every 5-10 minutes.  Slowly drink more until you have had the amount that your doctor said to have. Eating and drinking  Drink enough clear fluid to keep your pee pale yellow. If you were told to drink an ORS, finish the ORS first. Then, start slowly drinking other clear fluids. Drink fluids such as: ? Water. Do not drink only water. Doing that can make the salt (sodium) level in your body get too low. ? Water  from ice chips you suck on. ? Fruit juice that you have added water to (diluted). ? Low-calorie sports drinks.  Eat foods that have the right amounts of salts and minerals, such as: ? Bananas. ? Oranges. ? Potatoes. ? Tomatoes. ? Spinach.  Do not drink alcohol.  Avoid: ? Drinks that have a lot of sugar. These include:  High-calorie sports drinks.  Fruit juice that you did not add water to.  Soda.  Caffeine. ? Foods that are greasy or have a lot of fat or sugar.         General instructions  Take over-the-counter and prescription medicines only as told by your doctor.  Do not take salt tablets. Doing that can make the salt level in your body get too high.  Return to your normal activities as told by your doctor. Ask your doctor what activities are safe for you.  Keep all follow-up visits as told by your doctor. This is important. Contact a doctor if:  You have pain in your belly (abdomen) and the pain: ? Gets worse. ? Stays in one place.  You have a rash.  You have a stiff neck.  You get angry or annoyed (irritable) more easily than normal.  You are more tired or have a harder time waking than normal.  You feel: ? Weak or dizzy. ? Very thirsty. Get help right away if you have:  Any symptoms of very bad dehydration.  Symptoms of vomiting, such as: ? You cannot eat or drink without vomiting. ? Your vomiting gets worse or does not go away. ? Your vomit has blood or green stuff in it.  Symptoms that get worse with treatment.  A fever.  A very bad headache.  Problems with peeing or pooping (having a bowel movement), such as: ? Watery poop that gets worse or does not go away. ? Blood in your poop (stool). This may cause poop to look black and tarry. ? Not peeing in 6-8 hours. ? Peeing only a small amount of very dark pee in 6-8 hours.  Trouble breathing. These symptoms may be an emergency. Do not wait to see if the symptoms will go away. Get  medical help right away. Call your local emergency services (911 in the [place] Do not drive yourself to the hospital. Summary  Dehydration is a condition in which there is not enough water or other fluids in the body. This happens when a person loses more fluids than he or she takes in.  Treatment for this condition depends on how bad it is. Treatment should be started right away. Do not wait until your condition gets very bad.  Drink enough clear fluid to keep your pee pale yellow. If you were told to drink an oral rehydration solution (ORS), finish the ORS first. Then, start slowly drinking other clear fluids.    Take over-the-counter and prescription medicines only as told by your doctor.  Get help right away if you have any symptoms of very bad dehydration. This information is not intended to replace advice given to you by your health care provider. Make sure you discuss any questions you have with your health care provider. Document Revised: 01/22/2019 Document Reviewed: 01/22/2019 Elsevier Patient Education  2021 Elsevier Inc.  

## 2020-10-04 ENCOUNTER — Encounter: Payer: Self-pay | Admitting: Licensed Clinical Social Worker

## 2020-10-04 NOTE — Progress Notes (Signed)
[place] Work  CSW received notification that patient approved for assistance through the [employer]. Patient notified by [facility] as well.   Christeen Douglas, LCSW

## 2020-10-05 ENCOUNTER — Other Ambulatory Visit: Payer: Self-pay

## 2020-10-05 ENCOUNTER — Ambulatory Visit
Admission: RE | Admit: 2020-10-05 | Discharge: 2020-10-05 | Disposition: A | Discharge status: Home / Self Care | Payer: Medicaid Other | Source: Ambulatory Visit | Attending: Oncology | Admitting: Oncology

## 2020-10-05 ENCOUNTER — Encounter: Payer: Self-pay | Admitting: *Deleted

## 2020-10-05 DIAGNOSIS — C50412 Malignant neoplasm of upper-outer quadrant of left female breast: Secondary | ICD-10-CM

## 2020-10-05 DIAGNOSIS — Z170 Estrogen receptor positive status [ER+]: Secondary | ICD-10-CM

## 2020-10-05 DIAGNOSIS — Z1501 Genetic susceptibility to malignant neoplasm of breast: Secondary | ICD-10-CM

## 2020-10-05 DIAGNOSIS — Z1509 Genetic susceptibility to other malignant neoplasm: Secondary | ICD-10-CM

## 2020-10-05 MED ORDER — GADOBUTROL 1 MMOL/ML IV SOLN
10.0000 mL | Freq: Once | INTRAVENOUS | Status: AC | PRN
  Administered 2020-10-05: 10 mL via INTRAVENOUS

## 2020-10-11 ENCOUNTER — Other Ambulatory Visit: Payer: Self-pay | Admitting: General Surgery

## 2020-10-11 DIAGNOSIS — C50412 Malignant neoplasm of upper-outer quadrant of left female breast: Secondary | ICD-10-CM

## 2020-10-11 DIAGNOSIS — Z170 Estrogen receptor positive status [ER+]: Secondary | ICD-10-CM

## 2020-10-13 ENCOUNTER — Other Ambulatory Visit: Payer: Self-pay | Admitting: Oncology

## 2020-10-17 ENCOUNTER — Encounter: Payer: Self-pay | Admitting: *Deleted

## 2020-10-19 NOTE — Progress Notes (Signed)
[facility]  Telephone:([phone]) [phone] Fax:(336) [phone]     ID: Sheryl Mcmillan DOB: 20-Mar-1976  MR#: [id number]  JYN#:[id number]  Patient Care Team: Patient, No Pcp Per (Inactive) as PCP - General (General Practice) [name], RN as Oncology Nurse Navigator [name], RN as Oncology Nurse Navigator [name], MD as Consulting Physician (General Surgery) [name], [name], MD as Consulting Physician (Oncology) [name], MD as Consulting Physician (Radiation Oncology) [name], MD as Consulting Physician (Plastic Surgery) [name], MD OTHER MD:  CHIEF COMPLAINT: estrogen receptor negative, Her2 positive breast cancer  CURRENT TREATMENT: Neoadjuvant immunotherapy   INTERVAL HISTORY: Sheryl Mcmillan returns today for follow up and treatment of her estrogen receptor negative, Her2 positive breast cancer accompanied by her daughter (who incidentally is BRCA negative)  Since her last visit, Sheryl Mcmillan underwent post-treatment breast MRI on 10/05/2020 showing: breast composition B; 1.3 cm area of residual nodular enhancement without significant mass in the region of the biopsied 2.5 cm malignancy; no new or suspicious findings.  She has met with surgery and plastics and is planning bilateral mastectomies with reconstruction 11/10/2020.  It would be helpful from a surgical point of view if we could remove the port at that time.  [name] completed her chemotherapy at her last visit on 09/28/2020. She is now ready to start trastuzumab alone, to complete a year.  Her most recent echocardiogram on 09/14/2020 showed an ejection fraction of 65-70%. This is stable from prior in 05/2020.   REVIEW OF SYSTEMS: Sheryl Mcmillan generally feels well.  She is looking forward to her surgery although of course a little bit worried about it.  She has peripheral neuropathy involving the toes.  There is does not result in imbalance or falls.  It is more a numbness issue and not a  tingling issue.  Thankfully it does not involve the hands.  She has stopped having periods since the chemotherapy and of course she is scheduled for bilateral salpingo-oophorectomy in July.  Detailed review of systems today is otherwise stable.    COVID 19 VACCINATION STATUS: Status post vaccine x2, no booster as of February 2022   HISTORY OF CURRENT ILLNESS: From the original intake note:   Sheryl Mcmillan presented with a palpable lump in the outer left breast, which she initially felt approximately 6 months prior. She waited to seek medical attention because she had no insurance.  Eventually she enrolled in the [facility] program and underwent bilateral diagnostic mammography with tomography and left breast ultrasonography at [facility] on 05/10/2020 showing: breast density category B; vaguely palpable 2.5 cm mass in left breast at 1:30; no pathologic left axillary lymphadenopathy or evidence of right breast malignancy.  Accordingly on 05/18/2020 she proceeded to biopsy of the left breast area in question. The pathology from this procedure ([id number]) showed: invasive ductal carcinoma, grade 3. Prognostic indicators significant for: estrogen receptor, 40% positive with weak staining intensity and progesterone receptor, 0% negative. Proliferation marker Ki67 at 50%. HER2 positive by immunohistochemistry (3+).  The patient's subsequent history is as detailed below.   PAST MEDICAL HISTORY: Past Medical History:  Diagnosis Date  . Arthritis   . Breast cancer ([place])   . Family history of non-Hodgkin's lymphoma 06/01/2020  . Hemorrhoids   . Migraines   . Obesity   . PCOS (polycystic ovarian syndrome)     PAST SURGICAL HISTORY: Past Surgical History:  Procedure Laterality Date  . COSMETIC SURGERY    . DILATION AND CURETTAGE  OF UTERUS    . HEMORRHOID SURGERY    . IR IMAGING GUIDED PORT INSERTION  06/15/2020    FAMILY HISTORY: Family History  Problem Relation Age of Onset  .  Hypertension Mother   . Diabetes Mother   . Cancer Mother 30       unknown primary  . Other Mother        brain tumor; dx 2s  . Non-Hodgkin's lymphoma Brother 57  . Other Maternal Uncle 52       brain tumor  . Breast cancer Other        MGM's niece; dx late 66s  . Pancreatic cancer Neg Hx   . Colon cancer Neg Hx   . Endometrial cancer Neg Hx   . Prostate cancer Neg Hx   . Ovarian cancer Neg Hx    She has no information on her father. Her mother died at age 144 from stage IV cancer. The primary cancer was unknown, but it was not breast.  [name] has two half brothers (through her mother), one of which was diagnosed with non-Hodgkin's lymphoma. There is no family history of breast, ovarian, or prostate cancer to her knowledge. // Patient is BRCA1 positive. Daughter tested negative.   GYNECOLOGIC HISTORY:  No LMP recorded. Menarche: 45 years old Age at first live birth: 46 years old [name] 1 LMP 05/02/2020 Contraceptive: never used HRT n/a  Hysterectomy? no BSO? 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 She is single. Her significant other Sheryl Mcmillan is a Freight forwarder. She lives at home with Sheryl Mcmillan, her daughter Sheryl Mcmillan,  Sheryl Mcmillan and her 30-monthold daughter Sheryl Mcmillan Daughter Sheryl Mcmillan age 44 is a bChief Operating Officerat [place] Mcmill[place] in [place]    ADVANCED DIRECTIVES: Completed and notarized 09/07/2020.  She has named her aunt TMarveen Reeksas healthcare power of attorney   HEALTH MAINTENANCE: Social History   Tobacco Use  . Smoking status: Current Every Day Smoker    Packs/day: 0.50    Years: 10.00    Pack years: 5.00    Types: Cigarettes  . Smokeless tobacco: Never Used  Vaping Use  . Vaping Use: Never used  Substance Use Topics  . Alcohol use: Yes    Comment: occasionally  . Drug use: Yes    Types: Marijuana     Colonoscopy: 2018  PAP:  2018  Bone density: n/a (age)   Allergies  Allergen Reactions  . Crest Tc-Baking Soda [Sodium Fluoride] [employer] . Carboplatin Hives and Itching    Current Outpatient Medications  Medication Sig Dispense Refill  . dexamethasone (DECADRON) 4 MG tablet TAKE 2 TABLETS BY MOUTH 2 TIMES DAILY THE DAY BEFORE TAXOTERE. THEN TAKE 2 TABS DAILY FOR THREE DAYS AFTER CHEMOTHERAPY AS DIRECTED 30 tablet 1  . dibucaine (NUPERCAINAL) 1 % OINT PLACE 1 APPLICATION RECTALLY AS NEEDED FOR HEMORRHOIDS. 28 g 1  . diphenoxylate-atropine (LOMOTIL) 2.5-0.025 MG tablet TAKE 1 TO 2 TABLETS BY MOUTH 4 TIMES DAILY AS NEEDED FOR DIARRHEA 30 tablet 2  . fluconazole (DIFLUCAN) 200 MG tablet TAKE 1 TABLET BY MOUTH ONCE A DAY 7 tablet 0  . hydrocortisone (ANUSOL-HC) 2.5 % rectal cream APPLY RECTALLY TWICE A DAY 30 g 0  . hydrocortisone-pramoxine (ANALPRAM-HC) 2.5-1 % rectal cream APPLY RECTALLY 2 TIMES DAILY AS DIRECTED 30 g 0  . lidocaine-prilocaine (EMLA) cream APPLY TO AFFECTED  AREA ONCE 30 g 3  . LORazepam (ATIVAN) 0.5 MG tablet DISSOLVE 1 TABLET UNDER THE TONGUE EVERY 6 HOURS AS NEEDED FOR ANXIETY 30 tablet 0  . pramoxine-hydrocortisone (PROCTOCREAM-HC) 1-1 % rectal cream Place 1 application rectally 2 (two) times daily. 30 g 0  . prochlorperazine (COMPAZINE) 10 MG tablet TAKE 1 TABLET BY MOUTH EVERY 6 HOURS AS NEEDED FOR NAUSEA OR VOMITING 30 tablet 1   No current facility-administered medications for this visit.   Facility-Administered Medications Ordered in Other Visits  Medication Dose Route Frequency Provider Last Rate Last Admin  . sodium chloride flush (NS) 0.9 % injection 10 mL  10 mL Intravenous PRN Causey, Charlestine Massed, NP        OBJECTIVE: White woman who appears stated age Vitals:   10/20/20 0827  BP: 128/82  Pulse: 87  Resp: 18  Temp: 97.6 F (36.4 C)  SpO2: 99%   Body mass index is 43.96 kg/m. Filed Weights   10/20/20 0827  Weight: (!) 315 lb 3.2 oz (143 kg)     Sclerae unicteric, EOMs intact Wearing a mask No cervical or supraclavicular adenopathy Lungs no rales or rhonchi Heart regular rate and rhythm Abd soft, nontender, positive bowel sounds MSK no focal spinal tenderness, no upper extremity lymphedema Neuro: nonfocal, well oriented, appropriate affect Breasts: Deferred   LAB RESULTS:  CMP     Component Value Date/Time   NA 140 09/28/2020 1214   K 4.3 09/28/2020 1214   CL 109 09/28/2020 1214   CO2 21 (L) 09/28/2020 1214   GLUCOSE 155 (H) 09/28/2020 1214   BUN 10 09/28/2020 1214   CREATININE 0.76 09/28/2020 1214   CALCIUM 8.7 (L) 09/28/2020 1214   PROT 6.6 09/28/2020 1214   ALBUMIN 3.7 09/28/2020 1214   AST 15 09/28/2020 1214   ALT 32 09/28/2020 1214   ALKPHOS 69 09/28/2020 1214   BILITOT <0.2 (L) 09/28/2020 1214   GFRNONAA >60 09/28/2020 1214   GFRAA >60 06/06/2019 1205    No results found for: TOTALPROTELP, ALBUMINELP, A1GS, A2GS, BETS, BETA2SER, GAMS, MSPIKE, SPEI  Lab Results  Component Value Date   WBC 5.8 10/20/2020   NEUTROABS 3.2 10/20/2020   HGB 10.7 (L) 10/20/2020   HCT 33.7 (L) 10/20/2020   MCV 96.6 10/20/2020   PLT 280 10/20/2020    No results found for: LABCA2  No components found for: CHYIFO277  No results for input(s): INR in the last 168 hours.  No results found for: LABCA2  No results found for: CAN199  Lab Results  Component Value Date   CAN125 10.2 07/28/2020    No results found for: AJO878  No results found for: CA2729  No components found for: HGQUANT  No results found for: CEA1 / No results found for: CEA1   No results found for: AFPTUMOR  No results found for: CHROMOGRNA  No results found for: KPAFRELGTCHN, LAMBDASER, KAPLAMBRATIO (kappa/lambda light chains)  No results found for: HGBA, HGBA2QUANT, HGBFQUANT, HGBSQUAN (Hemoglobinopathy evaluation)   No results found for: LDH  No results found for: IRON, TIBC, IRONPCTSAT (Iron and TIBC)  No results found for:  FERRITIN  Urinalysis    Component Value Date/Time   COLORURINE YELLOW 06/06/2019 [place] 06/06/2019 1205   LABSPEC >1.030 (H) 06/06/2019 1205   PHURINE 5.5 06/06/2019 [place] 06/06/2019 [place] (A) 06/06/2019 [place] 06/06/2019 [place] 06/06/2019 [place] 06/06/2019 1205  UROBILINOGEN 1.0 05/09/2011 1805   NITRITE NEGATIVE 06/06/2019 1205   LEUKOCYTESUR NEGATIVE 06/06/2019 1205    STUDIES: MR BREAST BILATERAL W WO CONTRAST INC CAD  Result Date: 10/05/2020 CLINICAL DATA:  Assess response to neoadjuvant treatment. Ultrasound-guided core biopsy was performed of a 2.5 centimeter mass in the 1:30 o'clock location of the LEFT breast in November 2021, showing grade 3 invasive ductal carcinoma. LABS:  None obtained at the time of imaging. EXAM: BILATERAL BREAST MRI WITH AND WITHOUT CONTRAST TECHNIQUE: Multiplanar, multisequence MR images of both breasts were obtained prior to and following the intravenous administration of 10 ml of Gadavist Three-dimensional MR images were rendered by post-processing of the original MR data on an independent workstation. The three-dimensional MR images were interpreted, and findings are reported in the following complete MRI report for this study. Three dimensional images were evaluated at the independent interpreting workstation using the DynaCAD thin client. The patient is undergoing chemotherapy for known LEFT breast cancer. Patient experienced nausea and vomiting during the exam, precluding the 3 minutes and 5 minutes post gadolinium images. COMPARISON:  05/18/2020; no previous MRI exams for comparison FINDINGS: Breast composition: b. Scattered fibroglandular tissue. Background parenchymal enhancement: Minimal Right breast: No mass or abnormal enhancement. RIGHT-sided Port-A-Cath. Left breast: Within the UPPER central portion of the LEFT breast, there is tissue  marker clip artifact from previous ultrasound-guided core biopsy. Adjacent to this clip, there is minimal nodular enhancement measuring 1.3 x 1.1 x 1.2 centimeters. (Images 96-98 of series 5). There is no residual mass on noncontrast images. Lymph nodes: No abnormal appearing lymph nodes. Ancillary findings:  None. IMPRESSION: 1. 1.3 centimeter area of residual nodular enhancement without significant mass in the region of the biopsied 2.5 centimeter malignancy. 2. No new or suspicious findings. RECOMMENDATION: Treatment plan for known LEFT breast malignancy. BI-RADS CATEGORY  6: Known biopsy-proven malignancy. Electronically Signed   By: Nolon Nations M.D.   On: 10/05/2020 11:11     ELIGIBLE FOR AVAILABLE RESEARCH PROTOCOL: AET  ASSESSMENT: 45 y.o. BRCA positive [place] woman status post left breast upper outer quadrant biopsy 05/18/2020 for a clinical T2N0, stage IIA invasive ductal carcinoma, grade 3, estrogen receptor weakly positive, progesterone receptor negative, with an MIB-1 of 50%, and HER-2 positive by immunohistochemistry  (1) genetics testing 06/16/2020 shows a pathogenic mutation in BRCA1 at c.2475del (p.Asp825Glufs*21)  (a)  Variant of uncertain significance in MSH3 at c.2724A>G (Silent).  (b) no additional deleterious mutations through the Multi-Cancer Panel offered by [facility] including AIP, ALK, APC, ATM, AXIN2,BAP1,  BARD1, BLM, BMPR1A, BRCA1, BRCA2, BRIP1, CASR, CDC73, CDH1, CDK4, CDKN1B, CDKN1C, CDKN2A (p14ARF), CDKN2A (p16INK4a), CEBPA, CHEK2, CTNNA1, DICER1, DIS3L2, EGFR (c.2369C>T, p.Thr790Met variant only), EPCAM (Deletion/duplication testing only), FH, FLCN, GATA2, GPC3, GREM1 (Promoter region deletion/duplication testing only), HOXB13 (c.251G>A, p.Gly84Glu), HRAS, KIT, MAX, MEN1, MET, MITF (c.952G>A, p.Glu318Lys variant only), MLH1, MSH2, MSH3, MSH6, MUTYH, NBN, NF1, NF2, NTHL1, PALB2, PDGFRA, PHOX2B, PMS2, POLD1, POLE, POT1, PRKAR1A, PTCH1, PTEN, RAD50, RAD51C, RAD51D, RB1,  RECQL4, RET, RNF43, RUNX1, SDHAF2, SDHA (sequence changes only), SDHB, SDHC, SDHD, SMAD4, SMARCA4, SMARCB1, SMARCE1, STK11, SUFU, TERC, TERT, TMEM127, TP53, TSC1, TSC2, VHL, WRN and WT1.   (2) neoadjuvant chemotherapy to consist of carboplatin and docetaxel together with trastuzumab and Pertuzumab every 3 weeks x 6, starting 06/16/2020, completed 09/28/2020   (a) pertuzumab omitted after cycle 1  (3) trastuzumab to be continued to complete 1 year  (a) echo 06/09/2020 shows EF in the 60-65% range  (b) echo 09/14/2020 shows an ejection  fraction in 65-70% range.  (4) definitive surgery to follow  (5) adjuvant radiation as appropriate  (6) antiestrogens  (7) olaparib   PLAN:  Sheryl Mcmillan had a good radiologic response to the chemotherapy.  She is scheduled for bilateral mastectomies with reconstruction 11/10/2020.  I am expecting good pathologic news at that time.  I am changing the dose of her trastuzumab today so that we can treat her again before the surgery otherwise her treatment would have been 2 days postop and that of course would not have worked  I am going to try to change her Herceptin to subcu but even if we are not able to obtain that for her we can treat her through a peripheral vein as needed  Unfortunately the neuropathy in the toes may be permanent.  Sometimes it slowly improves over period of months.  This is grade 1.  Luckily it does not involve the hands  She is going to see me again June 23.  She should have an echo right around that time.  She will be seeing gynecology oncology around that time as well to plan for her BSO with or without hysterectomy  Total encounter time 25 minutes.Sheryl Jews C. Carlissa Pesola, MD 10/20/20 8:58 AM Medical Oncology and Hematology [facility] [place], [place] [address] Tel. [phone]    Fax. [phone]   I, Wilburn Mylar, am acting as scribe for Dr. [name]. Sheryl Mcmillan.  I, Lurline Del MD, have  reviewed the above documentation for accuracy and completeness, and I agree with the above.   *Total Encounter Time as defined by the Centers for Medicare and Medicaid Services includes, in addition to the face-to-face time of a patient visit (documented in the note above) non-face-to-face time: obtaining and reviewing outside history, ordering and reviewing medications, tests or procedures, care coordination (communications with other health care professionals or caregivers) and documentation in the medical record.

## 2020-10-20 ENCOUNTER — Encounter: Payer: Self-pay | Admitting: *Deleted

## 2020-10-20 ENCOUNTER — Inpatient Hospital Stay: Payer: Medicaid Other

## 2020-10-20 ENCOUNTER — Inpatient Hospital Stay (HOSPITAL_BASED_OUTPATIENT_CLINIC_OR_DEPARTMENT_OTHER): Payer: Medicaid Other | Admitting: Oncology

## 2020-10-20 ENCOUNTER — Other Ambulatory Visit: Payer: Medicaid Other

## 2020-10-20 ENCOUNTER — Other Ambulatory Visit: Payer: Self-pay

## 2020-10-20 ENCOUNTER — Ambulatory Visit (INDEPENDENT_AMBULATORY_CARE_PROVIDER_SITE_OTHER): Payer: Medicaid Other

## 2020-10-20 VITALS — BP 115/66 | HR 75 | Temp 97.6°F | Ht 71.0 in | Wt 310.0 lb

## 2020-10-20 DIAGNOSIS — Z5111 Encounter for antineoplastic chemotherapy: Secondary | ICD-10-CM | POA: Diagnosis not present

## 2020-10-20 DIAGNOSIS — C50412 Malignant neoplasm of upper-outer quadrant of left female breast: Secondary | ICD-10-CM

## 2020-10-20 DIAGNOSIS — Z170 Estrogen receptor positive status [ER+]: Secondary | ICD-10-CM

## 2020-10-20 DIAGNOSIS — Z1501 Genetic susceptibility to malignant neoplasm of breast: Secondary | ICD-10-CM

## 2020-10-20 DIAGNOSIS — Z1509 Genetic susceptibility to other malignant neoplasm: Secondary | ICD-10-CM

## 2020-10-20 DIAGNOSIS — T451X5A Adverse effect of antineoplastic and immunosuppressive drugs, initial encounter: Secondary | ICD-10-CM | POA: Diagnosis not present

## 2020-10-20 DIAGNOSIS — G620 Drug-induced polyneuropathy: Secondary | ICD-10-CM | POA: Diagnosis not present

## 2020-10-20 DIAGNOSIS — Z95828 Presence of other vascular implants and grafts: Secondary | ICD-10-CM

## 2020-10-20 LAB — CBC WITH DIFFERENTIAL (CANCER CENTER ONLY)
Abs Immature Granulocytes: 0.02 10*3/uL (ref 0.00–0.07)
Basophils Absolute: 0 10*3/uL (ref 0.0–0.1)
Basophils Relative: 0 %
Eosinophils Absolute: 0 10*3/uL (ref 0.0–0.5)
Eosinophils Relative: 0 %
HCT: 33.7 % — ABNORMAL LOW (ref 36.0–46.0)
Hemoglobin: 10.7 g/dL — ABNORMAL LOW (ref 12.0–15.0)
Immature Granulocytes: 0 %
Lymphocytes Relative: 37 %
Lymphs Abs: 2.2 10*3/uL (ref 0.7–4.0)
MCH: 30.7 pg (ref 26.0–34.0)
MCHC: 31.8 g/dL (ref 30.0–36.0)
MCV: 96.6 fL (ref 80.0–100.0)
Monocytes Absolute: 0.4 10*3/uL (ref 0.1–1.0)
Monocytes Relative: 8 %
Neutro Abs: 3.2 10*3/uL (ref 1.7–7.7)
Neutrophils Relative %: 55 %
Platelet Count: 280 10*3/uL (ref 150–400)
RBC: 3.49 MIL/uL — ABNORMAL LOW (ref 3.87–5.11)
RDW: 17.5 % — ABNORMAL HIGH (ref 11.5–15.5)
WBC Count: 5.8 10*3/uL (ref 4.0–10.5)
nRBC: 0 % (ref 0.0–0.2)

## 2020-10-20 LAB — CMP (CANCER CENTER ONLY)
ALT: 27 U/L (ref 0–44)
AST: 21 U/L (ref 15–41)
Albumin: 3.5 g/dL (ref 3.5–5.0)
Alkaline Phosphatase: 59 U/L (ref 38–126)
Anion gap: 10 (ref 5–15)
BUN: 12 mg/dL (ref 6–20)
CO2: 23 mmol/L (ref 22–32)
Calcium: 8.9 mg/dL (ref 8.9–10.3)
Chloride: 110 mmol/L (ref 98–111)
Creatinine: 0.75 mg/dL (ref 0.44–1.00)
GFR, Estimated: >60 mL/min (ref >60)
Glucose, Bld: 118 mg/dL — ABNORMAL HIGH (ref 70–99)
Potassium: 4.2 mmol/L (ref 3.5–5.1)
Sodium: 143 mmol/L (ref 135–145)
Total Bilirubin: 0.2 mg/dL — ABNORMAL LOW (ref 0.3–1.2)
Total Protein: 6.1 g/dL — ABNORMAL LOW (ref 6.5–8.1)

## 2020-10-20 MED ORDER — DIPHENHYDRAMINE HCL 25 MG PO CAPS
25.0000 mg | ORAL_CAPSULE | Freq: Once | ORAL | Status: AC
  Administered 2020-10-20: 25 mg via ORAL

## 2020-10-20 MED ORDER — ACETAMINOPHEN 325 MG PO TABS
650.0000 mg | ORAL_TABLET | Freq: Once | ORAL | Status: AC
  Administered 2020-10-20: 650 mg via ORAL

## 2020-10-20 MED ORDER — SODIUM CHLORIDE 0.9% FLUSH
10.0000 mL | Freq: Once | INTRAVENOUS | Status: AC
  Administered 2020-10-20: 10 mL
  Filled 2020-10-20: qty 10

## 2020-10-20 MED ORDER — SODIUM CHLORIDE 0.9 % IV SOLN
Freq: Once | INTRAVENOUS | Status: AC
  Filled 2020-10-20: qty 250

## 2020-10-20 MED ORDER — TRASTUZUMAB-DKST CHEMO 150 MG IV SOLR
4.0000 mg/kg | Freq: Once | INTRAVENOUS | Status: AC
  Administered 2020-10-20: 567 mg via INTRAVENOUS
  Filled 2020-10-20: qty 27

## 2020-10-20 NOTE — Patient Instructions (Signed)
Implanted Port Insertion, Care After This sheet gives you information about how to care for yourself after your procedure. Your health care provider may also give you more specific instructions. If you have problems or questions, contact your health care provider. What can I expect after the procedure? After the procedure, it is common to have:  Discomfort at the port insertion site.  Bruising on the skin over the port. This should improve over 3-4 days. Follow these instructions at home: Port care  After your port is placed, you will get a manufacturer's information card. The card has information about your port. Keep this card with you at all times.  Take care of the port as told by your health care provider. Ask your health care provider if you or a family member can get training for taking care of the port at home. A home health care nurse may also take care of the port.  Make sure to remember what type of port you have. Incision care  Follow instructions from your health care provider about how to take care of your port insertion site. Make sure you: ? Wash your hands with soap and water before and after you change your bandage (dressing). If soap and water are not available, use hand sanitizer. ? Change your dressing as told by your health care provider. ? Leave stitches (sutures), skin glue, or adhesive strips in place. These skin closures may need to stay in place for 2 weeks or longer. If adhesive strip edges start to loosen and curl up, you may trim the loose edges. Do not remove adhesive strips completely unless your health care provider tells you to do that.  Check your port insertion site every day for signs of infection. Check for: ? Redness, swelling, or pain. ? Fluid or blood. ? Warmth. ? Pus or a bad smell.      Activity  Return to your normal activities as told by your health care provider. Ask your health care provider what activities are safe for you.  Do not  lift anything that is heavier than 10 lb (4.5 kg), or the limit that you are told, until your health care provider says that it is safe. General instructions  Take over-the-counter and prescription medicines only as told by your health care provider.  Do not take baths, swim, or use a hot tub until your health care provider approves. Ask your health care provider if you may take showers. You may only be allowed to take sponge baths.  Do not drive for 24 hours if you were given a sedative during your procedure.  Wear a medical alert bracelet in case of an emergency. This will tell any health care providers that you have a port.  Keep all follow-up visits as told by your health care provider. This is important. Contact a health care provider if:  You cannot flush your port with saline as directed, or you cannot draw blood from the port.  You have a fever or chills.  You have redness, swelling, or pain around your port insertion site.  You have fluid or blood coming from your port insertion site.  Your port insertion site feels warm to the touch.  You have pus or a bad smell coming from the port insertion site. Get help right away if:  You have chest pain or shortness of breath.  You have bleeding from your port that you cannot control. Summary  Take care of the port as told by your   health care provider. Keep the manufacturer's information card with you at all times.  Change your dressing as told by your health care provider.  Contact a health care provider if you have a fever or chills or if you have redness, swelling, or pain around your port insertion site.  Keep all follow-up visits as told by your health care provider. This information is not intended to replace advice given to you by your health care provider. Make sure you discuss any questions you have with your health care provider. Document Revised: 01/07/2018 Document Reviewed: 01/07/2018 Elsevier Patient Education   2021 Elsevier Inc.  

## 2020-10-20 NOTE — Patient Instructions (Signed)
[facility] ONCOLOGY  Discharge Instructions: Thank you for choosing [facility] to provide your oncology and hematology care.   If you have a lab appointment with the [facility], please go directly to the [facility] and check in at the registration area.   Wear comfortable clothing and clothing appropriate for easy access to any Portacath or PICC line.   We strive to give you quality time with your provider. You may need to reschedule your appointment if you arrive late (15 or more minutes).  Arriving late affects you and other patients whose appointments are after yours.  Also, if you miss three or more appointments without notifying the office, you may be dismissed from the clinic at the provider's discretion.      For prescription refill requests, have your pharmacy contact our office and allow 72 hours for refills to be completed.    Today you received the following chemotherapy and/or immunotherapy agents Trastuzimab  To help prevent nausea and vomiting after your treatment, we encourage you to take your nausea medication as directed.  BELOW ARE SYMPTOMS THAT SHOULD BE REPORTED IMMEDIATELY: . *FEVER GREATER THAN 100.4 F (38 C) OR HIGHER . *CHILLS OR SWEATING . *NAUSEA AND VOMITING THAT IS NOT CONTROLLED WITH YOUR NAUSEA MEDICATION . *UNUSUAL SHORTNESS OF BREATH . *UNUSUAL BRUISING OR BLEEDING . *URINARY PROBLEMS (pain or burning when urinating, or frequent urination) . *BOWEL PROBLEMS (unusual diarrhea, constipation, pain near the anus) . TENDERNESS IN MOUTH AND THROAT WITH OR WITHOUT PRESENCE OF ULCERS (sore throat, sores in mouth, or a toothache) . UNUSUAL RASH, SWELLING OR PAIN  . UNUSUAL VAGINAL DISCHARGE OR ITCHING   Items with * indicate a potential emergency and should be followed up as soon as possible or go to the Emergency Department if any problems should occur.  Please show the CHEMOTHERAPY ALERT CARD or IMMUNOTHERAPY ALERT CARD  at check-in to the Emergency Department and triage nurse.  Should you have questions after your visit or need to cancel or reschedule your appointment, please contact [facility]  Dept: [phone]  and follow the prompts.  Office hours are 8:00 a.m. to 4:30 p.m. Monday - Friday. Please note that voicemails left after 4:00 p.m. may not be returned until the following business day.  We are closed weekends and major holidays. You have access to a nurse at all times for urgent questions. Please call the main number to the clinic Dept: [phone] and follow the prompts.   For any non-urgent questions, you may also contact your provider using MyChart. We now offer e-Visits for anyone 58 and older to request care online for non-urgent symptoms. For details visit [email or web address].   Also download the MyChart app! Go to the app store, search "MyChart", open the app, select [facility], and log in with your MyChart username and password.  Due to Covid, a mask is required upon entering the hospital/clinic. If you do not have a mask, one will be given to you upon arrival. For doctor visits, patients may have 1 support person aged 67 or older with them. For treatment visits, patients cannot have anyone with them due to current Covid guidelines and our immunocompromised population.

## 2020-10-25 ENCOUNTER — Telehealth: Payer: Self-pay | Admitting: Oncology

## 2020-10-25 NOTE — Telephone Encounter (Signed)
Scheduled per 4/28 los. Pt will receive an updated appt calendar per next visit appt notes

## 2020-11-01 NOTE — Progress Notes (Signed)
Surgical Instructions    Your procedure is scheduled on 11/07/20.  Report to [facility] Main Entrance "A" at 05:30 A.M., then check in with the Admitting office.  Call this number if you have problems the morning of surgery:  [phone]   If you have any questions prior to your surgery date call [phone]: Open Monday-Friday 8am-4pm    Remember:  Do not eat after midnight the night before your surgery  You may drink clear liquids until 04:30am the morning of your surgery.   Clear liquids allowed are: Water, Non-Citrus Juices (without pulp), Carbonated Beverages, Clear Tea, Black Coffee Only, and Gatorade  Patient Instructions  . The night before surgery:  o No food after midnight. ONLY clear liquids after midnight  . The day of surgery (if you do NOT have diabetes):  o Drink ONE (1) Pre-Surgery Clear Ensure by 04:30am the morning of surgery. Drink in one sitting. Do not sip.  o This drink was given to you during your hospital  pre-op appointment visit. o Nothing else to drink after completing the  Pre-Surgery Clear Ensure.         If you have questions, please contact your surgeon's office.     Take these medicines the morning of surgery with A SIP OF WATER: NONE   As of today, STOP taking any Aspirin (unless otherwise instructed by your surgeon) Aleve, Naproxen, Ibuprofen, Motrin, Advil, Goody's, BC's, all herbal medications, fish oil, and all vitamins.                     Do not wear jewelry, make up, or nail polish            Do not wear lotions, powders, perfumes, or deodorant.            Do not shave 48 hours prior to surgery.              Do not bring valuables to the hospital.            [facility] is not responsible for any belongings or valuables.  Do NOT Smoke (Tobacco/Vaping) or drink Alcohol 24 hours prior to your procedure If you use a CPAP at night, you may bring all equipment for your overnight stay.   Contacts, glasses, dentures or bridgework may not  be worn into surgery, please bring cases for these belongings   For patients admitted to the hospital, discharge time will be determined by your treatment team.   Patients discharged the day of surgery will not be allowed to drive home, and someone needs to stay with them for 24 hours.    Special instructions:   [place]- Preparing For Surgery  Before surgery, you can play an important role. Because skin is not sterile, your skin needs to be as free of germs as possible. You can reduce the number of germs on your skin by washing with CHG (chlorahexidine gluconate) Soap before surgery.  CHG is an antiseptic cleaner which kills germs and bonds with the skin to continue killing germs even after washing.    Oral Hygiene is also important to reduce your risk of infection.  Remember - BRUSH YOUR TEETH THE MORNING OF SURGERY WITH YOUR REGULAR TOOTHPASTE  Please do not use if you have an allergy to CHG or antibacterial soaps. If your skin becomes reddened/irritated stop using the CHG.  Do not shave (including legs and underarms) for at least 48 hours prior to first CHG shower. It  is OK to shave your face.  Please follow these instructions carefully.   1. Shower the NIGHT BEFORE SURGERY and the MORNING OF SURGERY  2. If you chose to wash your hair, wash your hair first as usual with your normal shampoo.  3. After you shampoo, rinse your hair and body thoroughly to remove the shampoo.  4. Wash Face and genitals (private parts) with your normal soap.   5.  Shower the NIGHT BEFORE SURGERY and the MORNING OF SURGERY with CHG Soap.   6. Use CHG Soap as you would any other liquid soap. You can apply CHG directly to the skin and wash gently with a scrungie or a clean washcloth.   7. Apply the CHG Soap to your body ONLY FROM THE NECK DOWN.  Do not use on open wounds or open sores. Avoid contact with your eyes, ears, mouth and genitals (private parts). Wash Face and genitals (private parts)  with  your normal soap.   8. Wash thoroughly, paying special attention to the area where your surgery will be performed.  9. Thoroughly rinse your body with warm water from the neck down.  10. DO NOT shower/wash with your normal soap after using and rinsing off the CHG Soap.  11. Pat yourself dry with a CLEAN TOWEL.  12. Wear CLEAN PAJAMAS to bed the night before surgery  13. Place CLEAN SHEETS on your bed the night before your surgery  14. DO NOT SLEEP WITH PETS.   Day of Surgery: Take a shower with CHG soap. Wear Clean/Comfortable clothing the morning of surgery Do not apply any deodorants/lotions.   Remember to brush your teeth WITH YOUR REGULAR TOOTHPASTE.   Please read over the following fact sheets that you were given.

## 2020-11-02 ENCOUNTER — Other Ambulatory Visit: Payer: Self-pay

## 2020-11-02 ENCOUNTER — Other Ambulatory Visit: Payer: Self-pay | Admitting: General Surgery

## 2020-11-02 ENCOUNTER — Encounter (HOSPITAL_COMMUNITY)
Admission: RE | Admit: 2020-11-02 | Discharge: 2020-11-02 | Disposition: A | Discharge status: Home / Self Care | Payer: Medicaid Other | Source: Ambulatory Visit | Attending: General Surgery | Admitting: General Surgery

## 2020-11-02 ENCOUNTER — Encounter (HOSPITAL_COMMUNITY): Payer: Self-pay | Admitting: General Surgery

## 2020-11-02 DIAGNOSIS — Z6841 Body Mass Index (BMI) 40.0 and over, adult: Secondary | ICD-10-CM | POA: Diagnosis not present

## 2020-11-02 DIAGNOSIS — Z01812 Encounter for preprocedural laboratory examination: Secondary | ICD-10-CM | POA: Insufficient documentation

## 2020-11-02 DIAGNOSIS — C50912 Malignant neoplasm of unspecified site of left female breast: Secondary | ICD-10-CM | POA: Diagnosis not present

## 2020-11-02 DIAGNOSIS — Z87891 Personal history of nicotine dependence: Secondary | ICD-10-CM | POA: Insufficient documentation

## 2020-11-02 NOTE — Progress Notes (Signed)
PCP - patient denies Cardiologist - patient denies  PPM/ICD - n/a Device Orders -  Rep Notified -   Chest x-ray - n/a EKG - n/a Stress Test - patient denies ECHO - 09/14/20 Cardiac Cath - patient denies  Sleep Study - patient denies CPAP -   Fasting Blood Sugar - n/a Checks Blood Sugar _____ times a day  Blood Thinner Instructions: n/a Aspirin Instructions:  ERAS Protcol - clears until 0430 PRE-SURGERY Ensure or G2- Ensure given  COVID TEST- 11/03/20   Anesthesia review: n/a  Patient denies shortness of breath, fever, cough and chest pain at PAT appointment   All instructions explained to the patient, with a verbal understanding of the material. Patient agrees to go over the instructions while at home for a better understanding. Patient also instructed to self quarantine after being tested for COVID-19. The opportunity to ask questions was provided.

## 2020-11-03 ENCOUNTER — Inpatient Hospital Stay: Payer: Medicaid Other

## 2020-11-03 ENCOUNTER — Other Ambulatory Visit (HOSPITAL_COMMUNITY): Payer: Self-pay

## 2020-11-03 ENCOUNTER — Inpatient Hospital Stay: Payer: Medicaid Other | Attending: Oncology

## 2020-11-03 ENCOUNTER — Encounter: Payer: Self-pay | Admitting: Oncology

## 2020-11-03 ENCOUNTER — Other Ambulatory Visit (HOSPITAL_COMMUNITY)
Admission: RE | Admit: 2020-11-03 | Discharge: 2020-11-03 | Disposition: A | Discharge status: Home / Self Care | Payer: Medicaid Other | Source: Ambulatory Visit | Attending: General Surgery | Admitting: General Surgery

## 2020-11-03 VITALS — BP 117/75 | HR 83 | Temp 98.6°F | Resp 18

## 2020-11-03 DIAGNOSIS — Z20822 Contact with and (suspected) exposure to covid-19: Secondary | ICD-10-CM | POA: Insufficient documentation

## 2020-11-03 DIAGNOSIS — Z79899 Other long term (current) drug therapy: Secondary | ICD-10-CM | POA: Insufficient documentation

## 2020-11-03 DIAGNOSIS — C50412 Malignant neoplasm of upper-outer quadrant of left female breast: Secondary | ICD-10-CM | POA: Insufficient documentation

## 2020-11-03 DIAGNOSIS — Z170 Estrogen receptor positive status [ER+]: Secondary | ICD-10-CM

## 2020-11-03 DIAGNOSIS — Z1501 Genetic susceptibility to malignant neoplasm of breast: Secondary | ICD-10-CM

## 2020-11-03 DIAGNOSIS — Z5112 Encounter for antineoplastic immunotherapy: Secondary | ICD-10-CM | POA: Diagnosis not present

## 2020-11-03 DIAGNOSIS — Z5111 Encounter for antineoplastic chemotherapy: Secondary | ICD-10-CM | POA: Diagnosis present

## 2020-11-03 DIAGNOSIS — Z1509 Genetic susceptibility to other malignant neoplasm: Secondary | ICD-10-CM

## 2020-11-03 DIAGNOSIS — Z01812 Encounter for preprocedural laboratory examination: Secondary | ICD-10-CM | POA: Diagnosis not present

## 2020-11-03 LAB — CBC WITH DIFFERENTIAL (CANCER CENTER ONLY)
Abs Immature Granulocytes: 0.02 10*3/uL (ref 0.00–0.07)
Basophils Absolute: 0 10*3/uL (ref 0.0–0.1)
Basophils Relative: 0 %
Eosinophils Absolute: 0.2 10*3/uL (ref 0.0–0.5)
Eosinophils Relative: 3 %
HCT: 35.5 % — ABNORMAL LOW (ref 36.0–46.0)
Hemoglobin: 11.3 g/dL — ABNORMAL LOW (ref 12.0–15.0)
Immature Granulocytes: 0 %
Lymphocytes Relative: 47 %
Lymphs Abs: 3.6 10*3/uL (ref 0.7–4.0)
MCH: 30.1 pg (ref 26.0–34.0)
MCHC: 31.8 g/dL (ref 30.0–36.0)
MCV: 94.7 fL (ref 80.0–100.0)
Monocytes Absolute: 0.6 10*3/uL (ref 0.1–1.0)
Monocytes Relative: 8 %
Neutro Abs: 3.2 10*3/uL (ref 1.7–7.7)
Neutrophils Relative %: 42 %
Platelet Count: 311 10*3/uL (ref 150–400)
RBC: 3.75 MIL/uL — ABNORMAL LOW (ref 3.87–5.11)
RDW: 15 % (ref 11.5–15.5)
WBC Count: 7.7 10*3/uL (ref 4.0–10.5)
nRBC: 0 % (ref 0.0–0.2)

## 2020-11-03 LAB — SARS CORONAVIRUS 2 (TAT 6-24 HRS): SARS Coronavirus 2: NEGATIVE

## 2020-11-03 LAB — CMP (CANCER CENTER ONLY)
ALT: 18 U/L (ref 0–44)
AST: 15 U/L (ref 15–41)
Albumin: 3.6 g/dL (ref 3.5–5.0)
Alkaline Phosphatase: 70 U/L (ref 38–126)
Anion gap: 9 (ref 5–15)
BUN: 13 mg/dL (ref 6–20)
CO2: 26 mmol/L (ref 22–32)
Calcium: 8.9 mg/dL (ref 8.9–10.3)
Chloride: 107 mmol/L (ref 98–111)
Creatinine: 0.84 mg/dL (ref 0.44–1.00)
GFR, Estimated: >60 mL/min (ref >60)
Glucose, Bld: 95 mg/dL (ref 70–99)
Potassium: 4.1 mmol/L (ref 3.5–5.1)
Sodium: 142 mmol/L (ref 135–145)
Total Bilirubin: <0.2 mg/dL — ABNORMAL LOW (ref 0.3–1.2)
Total Protein: 6.3 g/dL — ABNORMAL LOW (ref 6.5–8.1)

## 2020-11-03 MED ORDER — SODIUM CHLORIDE 0.9 % IV SOLN
Freq: Once | INTRAVENOUS | Status: AC
  Filled 2020-11-03: qty 250

## 2020-11-03 MED ORDER — HEPARIN SOD (PORK) LOCK FLUSH 100 UNIT/ML IV SOLN
500.0000 [IU] | Freq: Once | INTRAVENOUS | Status: AC | PRN
  Administered 2020-11-03: 500 [IU]
  Filled 2020-11-03: qty 5

## 2020-11-03 MED ORDER — SODIUM CHLORIDE 0.9 % IV SOLN
6.0000 mg/kg | Freq: Once | INTRAVENOUS | Status: AC
  Administered 2020-11-03: 840 mg via INTRAVENOUS
  Filled 2020-11-03: qty 40

## 2020-11-03 MED ORDER — ACETAMINOPHEN 325 MG PO TABS
650.0000 mg | ORAL_TABLET | Freq: Once | ORAL | Status: AC
Start: 2020-11-03 — End: 2020-11-03
  Administered 2020-11-03: 650 mg via ORAL

## 2020-11-03 MED ORDER — SODIUM CHLORIDE 0.9% FLUSH
10.0000 mL | INTRAVENOUS | Status: DC | PRN
  Administered 2020-11-03: 10 mL
  Filled 2020-11-03: qty 10

## 2020-11-03 MED ORDER — DIPHENHYDRAMINE HCL 25 MG PO CAPS
25.0000 mg | ORAL_CAPSULE | Freq: Once | ORAL | Status: AC
  Administered 2020-11-03: 25 mg via ORAL

## 2020-11-03 MED ORDER — ACETAMINOPHEN 325 MG PO TABS
ORAL_TABLET | ORAL | Status: AC
  Filled 2020-11-03: qty 2

## 2020-11-03 MED ORDER — DIPHENHYDRAMINE HCL 25 MG PO CAPS
ORAL_CAPSULE | ORAL | Status: AC
  Filled 2020-11-03: qty 1

## 2020-11-03 MED FILL — Lidocaine-Prilocaine Cream 2.5-2.5%: CUTANEOUS | 30 days supply | Qty: 30 | Fill #0 | Status: AC

## 2020-11-03 NOTE — Patient Instructions (Signed)
[facility] MEDICAL [facility]COL[facility]  Discharge Instructions: ?Thank you for choosing [facility] to provide your oncology and hematology care.  ? ?If you have a lab appointment with the Cancer Center, please go directly to the Cancer Center and check in at the registration area. ?  ?Wear comfortable clothing and clothing appropriate for easy access to any Portacath or PICC line.  ? ?We strive to give you quality time with your provider. You may need to reschedule your appointment if you arrive late (15 or more minutes).  Arriving late affects you and other patients whose appointments are after yours.  Also, if you miss three or more appointments without notifying the office, you may be dismissed from the clinic at the provider?s discretion.    ?  ?For prescription refill requests, have your pharmacy contact our office and allow 72 hours for refills to be completed.   ? ?Today you received the following chemotherapy and/or immunotherapy agents: Trastuzumab    ?  ?To help prevent nausea and vomiting after your treatment, we encourage you to take your nausea medication as directed. ? ?BELOW ARE SYMPTOMS THAT SHOULD BE REPORTED IMMEDIATELY: ?*FEVER GREATER THAN 100.4 F (38 ?C) OR HIGHER ?*CHILLS OR SWEATING ?*NAUSEA AND VOMITING THAT IS NOT CONTROLLED WITH YOUR NAUSEA MEDICATION ?*UNUSUAL SHORTNESS OF BREATH ?*UNUSUAL BRUISING OR BLEEDING ?*URINARY PROBLEMS (pain or burning when urinating, or frequent urination) ?*BOWEL PROBLEMS (unusual diarrhea, constipation, pain near the anus) ?TENDERNESS IN MOUTH AND THROAT WITH OR WITHOUT PRESENCE OF ULCERS (sore throat, sores in mouth, or a toothache) ?UNUSUAL RASH, SWELLING OR PAIN  ?UNUSUAL VAGINAL DISCHARGE OR ITCHING  ? ?Items with * indicate a potential emergency and should be followed up as soon as possible or go to the Emergency Department if any problems should occur. ? ?Please show the CHEMOTHERAPY ALERT CARD or IMMUNOTHERAPY ALERT CARD at check-in  to the Emergency Department and triage nurse. ? ?Should you have questions after your visit or need to cancel or reschedule your appointment, please contact [facility]  Dept: [phone]  and follow the prompts.  Office hours are 8:00 a.m. to 4:30 p.m. Monday - Friday. Please note that voicemails left after 4:00 p.m. may not be returned until the following business day.  We are closed weekends and major holidays. You have access to a nurse at all times for urgent questions. Please call the main number to the clinic Dept: [phone] and follow the prompts. ? ? ?For any non-urgent questions, you may also contact your provider using MyChart. We now offer e-Visits for anyone 18 and older to request care online for non-urgent symptoms. For details visit [email or web address]. ?  ?Also download the MyChart app! Go to the app store, search "MyChart", open the app, select [facility], and log in with your MyChart username and password. ? ?Due to Covid, a mask is required upon entering the hospital/clinic. If you do not have a mask, one will be given to you upon arrival. For doctor visits, patients may have 1 support person aged 18 or older with them. For treatment visits, patients cannot have anyone with them due to current Covid guidelines and our immunocompromised population.  ? ?

## 2020-11-03 NOTE — Progress Notes (Signed)
Anesthesia Chart Review:  Case: [id number] Date/Time: 11/07/20 0715   Procedures:      BILATERAL MASTECTOMY (Bilateral Breast)     LEFT AXILLARY SENTINEL NODE BIOPSY (Left Breast)     REMOVAL PORT-A-CATH (N/A Breast)     BILATERAL BREAST RECONSTRUCTION WITH PLACEMENT OF TISSUE EXPANDER AND FLEX HD (ACELLULAR HYDRATED DERMIS) (Bilateral )   Anesthesia type: General   Pre-op diagnosis: BRCA POSITIVE, LEFT BREAST CANCER   Location: [place] OR ROOM 02 / [place] OR   Surgeons: [name], MD; [name], MD      DISCUSSION: Patient is a 45 year old female scheduled for the above procedure.  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 + Marijuana use.  BMI is consistent with morbid obesity.  She denied chest pain and SOB at PAT RN visit. Last echo 08/2020 (done for chemotherapy) showed LVEF 65-70%, no regional wall abnormalities. Last trastuzumab 11/03/20.   11/03/20 presurgical COVID-19 test in process.  Anesthesia team to evaluate on the day of surgery.   VS: BP 128/77   Pulse 100   Temp 37.3 C (Oral)   Resp 18   Ht _0  (1.803 m)   Wt (!) 141.6 kg   SpO2 98%   BMI 43.54 kg/m    PROVIDERS: Patient, No Pcp Per (Inactive) Magrinat, Sarajane Jews, MD is HEM-ONC. Last visit 10/20/20. Next visit planned 12/15/20 after her surgery with plans to update echo then. He also mention she will be meeting with GYN-ONC to plan for future BSO +/- hysterectomy.  [name], MD is RAD-ONC   LABS: She had labs on 11/03/20 at the [facility]. Results included: Lab Results  Component Value Date   WBC 7.7 11/03/2020   HGB 11.3 (L) 11/03/2020   HCT 35.5 (L) 11/03/2020   PLT 311 11/03/2020   GLUCOSE 95 11/03/2020   ALT 18 11/03/2020   AST 15 11/03/2020   NA 142 11/03/2020   K 4.1 11/03/2020   CL 107  11/03/2020   CREATININE 0.84 11/03/2020   BUN 13 11/03/2020   CO2 26 11/03/2020    EKG: N/A. Last EKG noted was from 04/11/11.    CV:  Echo 09/14/20: IMPRESSIONS  1. Left ventricular ejection fraction, by estimation, is 65 to 70%. The  left ventricle has normal function. The left ventricle has no regional  wall motion abnormalities. Left ventricular diastolic parameters are  indeterminate.  2. Right ventricular systolic function is normal. The right ventricular  size is normal.  3. The mitral valve is grossly normal. No evidence of mitral valve  regurgitation.  4. The aortic valve was not well visualized. Aortic valve regurgitation  is not visualized.    Past Medical History:  Diagnosis Date  . Arthritis   . Breast cancer ([place])   . Family history of non-Hodgkin's lymphoma 06/01/2020  . Hemorrhoids   . Migraines   . Obesity   . PCOS (polycystic ovarian syndrome)     Past Surgical History:  Procedure Laterality Date  . COSMETIC SURGERY    . DILATION AND CURETTAGE OF UTERUS    . HEMORRHOID SURGERY    . IR IMAGING GUIDED PORT INSERTION  06/15/2020    MEDICATIONS: . aspirin-acetaminophen-caffeine (EXCEDRIN MIGRAINE) 250-250-65 MG tablet  . dexamethasone (DECADRON) 4 MG tablet  . dibucaine (NUPERCAINAL) 1 % OINT  . diphenoxylate-atropine (LOMOTIL) 2.5-0.025 MG tablet  .  fluconazole (DIFLUCAN) 200 MG tablet  . hydrocortisone (ANUSOL-HC) 2.5 % rectal cream  . hydrocortisone-pramoxine (ANALPRAM-HC) 2.5-1 % rectal cream  . lidocaine-prilocaine (EMLA) cream  . LORazepam (ATIVAN) 0.5 MG tablet  . Multiple Vitamins-Minerals (ONE-A-DAY WOMENS PO)  . pramoxine-hydrocortisone (PROCTOCREAM-HC) 1-1 % rectal cream  . prochlorperazine (COMPAZINE) 10 MG tablet   No current facility-administered medications for this encounter.   . sodium chloride flush (NS) 0.9 % injection 10 mL    Myra Gianotti, PA-C Surgical Short Stay/Anesthesiology Center [facility] Phone [phone] [facility] Phone  [phone] 11/03/2020 5:29 PM

## 2020-11-03 NOTE — Anesthesia Preprocedure Evaluation (Addendum)
Anesthesia Evaluation  Patient identified by MRN, date of birth, ID band Patient awake    Reviewed: Allergy & Precautions, NPO status , Patient's Chart, lab work & pertinent test results  History of Anesthesia Complications Negative for: history of anesthetic complications  Airway Mallampati: II  TM Distance: >3 FB Neck ROM: Full    Dental  (+) Poor Dentition, Missing   Pulmonary former smoker,    Pulmonary exam normal        Cardiovascular negative cardio ROS Normal cardiovascular exam     Neuro/Psych  Headaches, negative psych ROS   GI/Hepatic negative GI ROS, (+)     substance abuse  marijuana use,   Endo/Other  Morbid obesity (BMI 44)  Renal/GU negative Renal ROS  negative genitourinary   Musculoskeletal  (+) Arthritis ,   Abdominal   Peds  Hematology Hgb 11.3   Anesthesia Other Findings Breast ca  Reproductive/Obstetrics negative OB ROS                           Anesthesia Physical Anesthesia Plan  ASA: III  Anesthesia Plan: General   Post-op Pain Management: GA combined w/ Regional for post-op pain   Induction: Intravenous  PONV Risk Score and Plan: 3 and Treatment may vary due to age or medical condition, Ondansetron, Dexamethasone, Midazolam and Scopolamine patch - Pre-op  Airway Management Planned: Oral ETT  Additional Equipment: None  Intra-op Plan:   Post-operative Plan: Extubation in OR  Informed Consent: I have reviewed the patients History and Physical, chart, labs and discussed the procedure including the risks, benefits and alternatives for the proposed anesthesia with the patient or authorized representative who has indicated his/her understanding and acceptance.     Dental advisory given  Plan Discussed with: CRNA  Anesthesia Plan Comments: (PAT note written 11/03/2020 by Myra Gianotti, PA-C. )      Anesthesia Quick Evaluation

## 2020-11-03 NOTE — Progress Notes (Signed)
Patient called to inquire about gas cards. Patient has an active grant which was opened for medication only originally. Patient supplied documentation needed. Sheryl Mcmillan can be utilized for gas cards as well but medications are deducted from same grant.  Gave gas card and my card for any additional financial questions or concerns.

## 2020-11-04 MED ORDER — CEFAZOLIN IN SODIUM CHLORIDE 3-0.9 GM/100ML-% IV SOLN
3.0000 g | INTRAVENOUS | Status: AC
  Administered 2020-11-07: 3 g via INTRAVENOUS
  Filled 2020-11-04 (×2): qty 100

## 2020-11-07 ENCOUNTER — Ambulatory Visit (HOSPITAL_COMMUNITY): Payer: Medicaid Other | Admitting: Certified Registered"

## 2020-11-07 ENCOUNTER — Other Ambulatory Visit: Payer: Self-pay

## 2020-11-07 ENCOUNTER — Observation Stay (HOSPITAL_COMMUNITY)
Admission: RE | Admit: 2020-11-07 | Discharge: 2020-11-08 | Disposition: A | Discharge status: Home / Self Care | Payer: Medicaid Other | Attending: Plastic Surgery | Admitting: Plastic Surgery

## 2020-11-07 ENCOUNTER — Encounter (HOSPITAL_COMMUNITY): Payer: Self-pay | Admitting: General Surgery

## 2020-11-07 ENCOUNTER — Ambulatory Visit (HOSPITAL_COMMUNITY)
Admission: RE | Admit: 2020-11-07 | Discharge: 2020-11-07 | Disposition: A | Discharge status: Home / Self Care | Payer: Medicaid Other | Source: Ambulatory Visit | Attending: General Surgery | Admitting: General Surgery

## 2020-11-07 ENCOUNTER — Encounter (HOSPITAL_COMMUNITY)
Admission: RE | Disposition: A | Discharge status: Home / Self Care | Payer: Self-pay | Source: Home / Self Care | Attending: Plastic Surgery

## 2020-11-07 ENCOUNTER — Ambulatory Visit (HOSPITAL_COMMUNITY): Payer: Medicaid Other | Admitting: Vascular Surgery

## 2020-11-07 DIAGNOSIS — Z79899 Other long term (current) drug therapy: Secondary | ICD-10-CM | POA: Diagnosis not present

## 2020-11-07 DIAGNOSIS — Z87891 Personal history of nicotine dependence: Secondary | ICD-10-CM | POA: Insufficient documentation

## 2020-11-07 DIAGNOSIS — C50412 Malignant neoplasm of upper-outer quadrant of left female breast: Secondary | ICD-10-CM

## 2020-11-07 DIAGNOSIS — Z170 Estrogen receptor positive status [ER+]: Secondary | ICD-10-CM

## 2020-11-07 DIAGNOSIS — C50919 Malignant neoplasm of unspecified site of unspecified female breast: Secondary | ICD-10-CM | POA: Diagnosis present

## 2020-11-07 DIAGNOSIS — C50912 Malignant neoplasm of unspecified site of left female breast: Principal | ICD-10-CM | POA: Insufficient documentation

## 2020-11-07 HISTORY — PX: PORT-A-CATH REMOVAL: SHX5289

## 2020-11-07 HISTORY — PX: AXILLARY SENTINEL NODE BIOPSY: SHX5738

## 2020-11-07 HISTORY — PX: BREAST RECONSTRUCTION WITH PLACEMENT OF TISSUE EXPANDER AND FLEX HD (ACELLULAR HYDRATED DERMIS): SHX6295

## 2020-11-07 HISTORY — PX: TOTAL MASTECTOMY: SHX6129

## 2020-11-07 LAB — POCT PREGNANCY, URINE: Preg Test, Ur: NEGATIVE

## 2020-11-07 SURGERY — MASTECTOMY, SIMPLE
Anesthesia: General | Site: Breast | Laterality: Right

## 2020-11-07 MED ORDER — LIDOCAINE 2% (20 MG/ML) 5 ML SYRINGE
INTRAMUSCULAR | Status: DC | PRN
  Administered 2020-11-07: 100 mg via INTRAVENOUS

## 2020-11-07 MED ORDER — ONDANSETRON 4 MG PO TBDP: 4.0000 mg | ORAL_TABLET | Freq: Four times a day (QID) | ORAL | Status: DC | PRN

## 2020-11-07 MED ORDER — PROCHLORPERAZINE MALEATE 10 MG PO TABS
10.0000 mg | ORAL_TABLET | Freq: Four times a day (QID) | ORAL | Status: DC | PRN
  Filled 2020-11-07: qty 1

## 2020-11-07 MED ORDER — PROPOFOL 10 MG/ML IV BOLUS
INTRAVENOUS | Status: AC
  Filled 2020-11-07: qty 20

## 2020-11-07 MED ORDER — HYDROCODONE-ACETAMINOPHEN 5-325 MG PO TABS
1.0000 | ORAL_TABLET | ORAL | 0 refills | Status: DC | PRN
  Filled 2020-11-07: qty 25, 5d supply, fill #0

## 2020-11-07 MED ORDER — CHLORHEXIDINE GLUCONATE 0.12 % MT SOLN
OROMUCOSAL | Status: AC
  Administered 2020-11-07: 15 mL via OROMUCOSAL
  Filled 2020-11-07: qty 15

## 2020-11-07 MED ORDER — SODIUM CHLORIDE 0.9 % IV SOLN
INTRAVENOUS | Status: DC | PRN
  Administered 2020-11-07: 1000 mL

## 2020-11-07 MED ORDER — CHLORHEXIDINE GLUCONATE 0.12 % MT SOLN: 15.0000 mL | Freq: Once | OROMUCOSAL | Status: AC

## 2020-11-07 MED ORDER — ACETAMINOPHEN 500 MG PO TABS
ORAL_TABLET | ORAL | Status: AC
  Filled 2020-11-07: qty 2

## 2020-11-07 MED ORDER — HYDROMORPHONE HCL 1 MG/ML IJ SOLN
INTRAMUSCULAR | Status: AC
  Filled 2020-11-07: qty 0.5

## 2020-11-07 MED ORDER — 0.9 % SODIUM CHLORIDE (POUR BTL) OPTIME
TOPICAL | Status: DC | PRN
  Administered 2020-11-07 (×3): 1000 mL

## 2020-11-07 MED ORDER — SUGAMMADEX SODIUM 200 MG/2ML IV SOLN
INTRAVENOUS | Status: DC | PRN
  Administered 2020-11-07: 200 mg via INTRAVENOUS

## 2020-11-07 MED ORDER — KETOROLAC TROMETHAMINE 15 MG/ML IJ SOLN: 15.0000 mg | INTRAMUSCULAR | Status: DC

## 2020-11-07 MED ORDER — LORAZEPAM 0.5 MG PO TABS
0.5000 mg | ORAL_TABLET | Freq: Four times a day (QID) | ORAL | Status: DC | PRN
  Administered 2020-11-07 (×2): 0.5 mg via ORAL
  Filled 2020-11-07 (×2): qty 1

## 2020-11-07 MED ORDER — PROMETHAZINE HCL 25 MG/ML IJ SOLN: 6.2500 mg | INTRAMUSCULAR | Status: DC | PRN

## 2020-11-07 MED ORDER — ONDANSETRON HCL 4 MG/2ML IJ SOLN
INTRAMUSCULAR | Status: DC | PRN
  Administered 2020-11-07: 4 mg via INTRAVENOUS

## 2020-11-07 MED ORDER — HYDROCODONE-ACETAMINOPHEN 5-325 MG PO TABS
1.0000 | ORAL_TABLET | ORAL | Status: DC | PRN
Start: 2020-11-07 — End: 2020-11-08
  Administered 2020-11-07 – 2020-11-08 (×4): 1 via ORAL
  Filled 2020-11-07 (×3): qty 1
  Filled 2020-11-07: qty 2

## 2020-11-07 MED ORDER — FENTANYL CITRATE (PF) 250 MCG/5ML IJ SOLN
INTRAMUSCULAR | Status: AC
  Filled 2020-11-07: qty 5

## 2020-11-07 MED ORDER — LIDOCAINE 2% (20 MG/ML) 5 ML SYRINGE
INTRAMUSCULAR | Status: AC
  Filled 2020-11-07: qty 5

## 2020-11-07 MED ORDER — ONDANSETRON HCL 4 MG/2ML IJ SOLN: 4.0000 mg | Freq: Four times a day (QID) | INTRAMUSCULAR | Status: DC | PRN

## 2020-11-07 MED ORDER — ROCURONIUM BROMIDE 10 MG/ML (PF) SYRINGE
PREFILLED_SYRINGE | INTRAVENOUS | Status: AC
  Filled 2020-11-07: qty 10

## 2020-11-07 MED ORDER — SCOPOLAMINE 1 MG/3DAYS TD PT72
MEDICATED_PATCH | TRANSDERMAL | Status: AC
  Filled 2020-11-07: qty 1

## 2020-11-07 MED ORDER — SCOPOLAMINE 1 MG/3DAYS TD PT72
1.0000 | MEDICATED_PATCH | Freq: Once | TRANSDERMAL | Status: DC
  Administered 2020-11-07: 1.5 mg via TRANSDERMAL

## 2020-11-07 MED ORDER — PROPOFOL 10 MG/ML IV BOLUS
INTRAVENOUS | Status: DC | PRN
  Administered 2020-11-07 (×6): 50 mg via INTRAVENOUS
  Administered 2020-11-07: 200 mg via INTRAVENOUS

## 2020-11-07 MED ORDER — PROCHLORPERAZINE EDISYLATE 10 MG/2ML IJ SOLN
5.0000 mg | Freq: Four times a day (QID) | INTRAMUSCULAR | Status: DC | PRN
  Administered 2020-11-07: 10 mg via INTRAVENOUS
  Filled 2020-11-07: qty 2

## 2020-11-07 MED ORDER — ACETAMINOPHEN 650 MG RE SUPP: 650.0000 mg | Freq: Four times a day (QID) | RECTAL | Status: DC | PRN

## 2020-11-07 MED ORDER — FENTANYL CITRATE (PF) 100 MCG/2ML IJ SOLN
INTRAMUSCULAR | Status: DC | PRN
  Administered 2020-11-07 (×5): 50 ug via INTRAVENOUS
  Administered 2020-11-07: 100 ug via INTRAVENOUS
  Administered 2020-11-07 (×4): 50 ug via INTRAVENOUS

## 2020-11-07 MED ORDER — ROCURONIUM BROMIDE 100 MG/10ML IV SOLN: INTRAVENOUS | Status: DC | PRN

## 2020-11-07 MED ORDER — SODIUM CHLORIDE (PF) 0.9 % IJ SOLN
INTRAVENOUS | Status: DC | PRN
  Administered 2020-11-07: 5 mL via INTRAMUSCULAR

## 2020-11-07 MED ORDER — ONDANSETRON HCL 4 MG/2ML IJ SOLN
INTRAMUSCULAR | Status: AC
  Filled 2020-11-07: qty 2

## 2020-11-07 MED ORDER — SPY AGENT GREEN - (INDOCYANINE FOR INJECTION)
INTRAMUSCULAR | Status: DC | PRN
  Administered 2020-11-07 (×2): 5 mg via INTRAVENOUS

## 2020-11-07 MED ORDER — CEFAZOLIN IN SODIUM CHLORIDE 3-0.9 GM/100ML-% IV SOLN
INTRAVENOUS | Status: AC
  Filled 2020-11-07: qty 100

## 2020-11-07 MED ORDER — ACETAMINOPHEN 500 MG PO TABS
1000.0000 mg | ORAL_TABLET | ORAL | Status: AC
  Administered 2020-11-07: 1000 mg via ORAL

## 2020-11-07 MED ORDER — ROCURONIUM BROMIDE 10 MG/ML (PF) SYRINGE
PREFILLED_SYRINGE | INTRAVENOUS | Status: DC | PRN
  Administered 2020-11-07: 30 mg via INTRAVENOUS
  Administered 2020-11-07 (×3): 20 mg via INTRAVENOUS
  Administered 2020-11-07: 70 mg via INTRAVENOUS
  Administered 2020-11-07: 20 mg via INTRAVENOUS

## 2020-11-07 MED ORDER — SODIUM CHLORIDE (PF) 0.9 % IJ SOLN
INTRAMUSCULAR | Status: AC
  Filled 2020-11-07: qty 10

## 2020-11-07 MED ORDER — SULFAMETHOXAZOLE-TRIMETHOPRIM 800-160 MG PO TABS
1.0000 | ORAL_TABLET | Freq: Two times a day (BID) | ORAL | 0 refills | Status: DC
  Filled 2020-11-07: qty 28, 14d supply, fill #0

## 2020-11-07 MED ORDER — BUPIVACAINE LIPOSOME 1.3 % IJ SUSP
INTRAMUSCULAR | Status: DC | PRN
  Administered 2020-11-07 (×2): 10 mL via PERINEURAL

## 2020-11-07 MED ORDER — KETOROLAC TROMETHAMINE 30 MG/ML IJ SOLN
INTRAMUSCULAR | Status: DC | PRN
  Administered 2020-11-07: 15 mg via INTRAVENOUS

## 2020-11-07 MED ORDER — FENTANYL CITRATE (PF) 100 MCG/2ML IJ SOLN: 25.0000 ug | INTRAMUSCULAR | Status: DC | PRN

## 2020-11-07 MED ORDER — DEXAMETHASONE SODIUM PHOSPHATE 10 MG/ML IJ SOLN
INTRAMUSCULAR | Status: DC | PRN
  Administered 2020-11-07: 10 mg via INTRAVENOUS

## 2020-11-07 MED ORDER — ACETAMINOPHEN 500 MG PO TABS: 1000.0000 mg | ORAL_TABLET | Freq: Once | ORAL | Status: DC

## 2020-11-07 MED ORDER — OXYCODONE HCL 5 MG/5ML PO SOLN: 5.0000 mg | Freq: Once | ORAL | Status: AC | PRN

## 2020-11-07 MED ORDER — MORPHINE SULFATE (PF) 2 MG/ML IV SOLN
1.0000 mg | INTRAVENOUS | Status: DC | PRN
  Administered 2020-11-07: 1 mg via INTRAVENOUS
  Filled 2020-11-07: qty 1

## 2020-11-07 MED ORDER — ACETAMINOPHEN 325 MG PO TABS: 650.0000 mg | ORAL_TABLET | Freq: Four times a day (QID) | ORAL | Status: DC | PRN

## 2020-11-07 MED ORDER — LACTATED RINGERS IV SOLN: INTRAVENOUS | Status: DC

## 2020-11-07 MED ORDER — BUPIVACAINE-EPINEPHRINE (PF) 0.25% -1:200000 IJ SOLN
INTRAMUSCULAR | Status: DC | PRN
  Administered 2020-11-07 (×2): 20 mL via PERINEURAL

## 2020-11-07 MED ORDER — KETOROLAC TROMETHAMINE 15 MG/ML IJ SOLN
INTRAMUSCULAR | Status: AC
  Filled 2020-11-07: qty 1

## 2020-11-07 MED ORDER — PHENYLEPHRINE 40 MCG/ML (10ML) SYRINGE FOR IV PUSH (FOR BLOOD PRESSURE SUPPORT)
PREFILLED_SYRINGE | INTRAVENOUS | Status: AC
  Filled 2020-11-07: qty 10

## 2020-11-07 MED ORDER — TECHNETIUM TC 99M SULFUR COLLOID
1.0000 | Freq: Once | INTRAVENOUS | Status: AC | PRN
  Administered 2020-11-07: 1 via INTRAVENOUS

## 2020-11-07 MED ORDER — ORAL CARE MOUTH RINSE
15.0000 mL | Freq: Once | OROMUCOSAL | Status: AC
Start: 2020-11-07 — End: 2020-11-07

## 2020-11-07 MED ORDER — OXYCODONE HCL 5 MG PO TABS
ORAL_TABLET | ORAL | Status: AC
  Administered 2020-11-07: 5 mg via ORAL
  Filled 2020-11-07: qty 1

## 2020-11-07 MED ORDER — MIDAZOLAM HCL 2 MG/2ML IJ SOLN
INTRAMUSCULAR | Status: AC
  Filled 2020-11-07: qty 2

## 2020-11-07 MED ORDER — PHENYLEPHRINE HCL (PRESSORS) 10 MG/ML IV SOLN
INTRAVENOUS | Status: DC | PRN
  Administered 2020-11-07: 80 ug via INTRAVENOUS
  Administered 2020-11-07: 40 ug via INTRAVENOUS

## 2020-11-07 MED ORDER — LIDOCAINE HCL (CARDIAC) PF 100 MG/5ML IV SOSY
PREFILLED_SYRINGE | INTRAVENOUS | Status: DC | PRN
  Administered 2020-11-07: 100 mg via INTRAVENOUS

## 2020-11-07 MED ORDER — METHYLENE BLUE 0.5 % INJ SOLN
INTRAVENOUS | Status: AC
  Filled 2020-11-07: qty 10

## 2020-11-07 MED ORDER — ONDANSETRON HCL 4 MG PO TABS
4.0000 mg | ORAL_TABLET | Freq: Three times a day (TID) | ORAL | 0 refills | Status: DC | PRN
  Filled 2020-11-07: qty 5, 2d supply, fill #0

## 2020-11-07 MED ORDER — DEXAMETHASONE SODIUM PHOSPHATE 10 MG/ML IJ SOLN
INTRAMUSCULAR | Status: AC
  Filled 2020-11-07: qty 1

## 2020-11-07 MED ORDER — HYDROMORPHONE HCL 1 MG/ML IJ SOLN
INTRAMUSCULAR | Status: DC | PRN
  Administered 2020-11-07: 1 mg via INTRAVENOUS

## 2020-11-07 MED ORDER — MIDAZOLAM HCL 2 MG/2ML IJ SOLN
INTRAMUSCULAR | Status: DC | PRN
  Administered 2020-11-07: 2 mg via INTRAVENOUS

## 2020-11-07 MED ORDER — ENSURE PRE-SURGERY PO LIQD: 296.0000 mL | Freq: Once | ORAL | Status: DC

## 2020-11-07 MED ORDER — METHOCARBAMOL 500 MG PO TABS
500.0000 mg | ORAL_TABLET | Freq: Four times a day (QID) | ORAL | Status: DC | PRN
  Administered 2020-11-07 – 2020-11-08 (×2): 500 mg via ORAL
  Filled 2020-11-07 (×2): qty 1

## 2020-11-07 MED ORDER — OXYCODONE HCL 5 MG PO TABS: 5.0000 mg | ORAL_TABLET | Freq: Once | ORAL | Status: AC | PRN

## 2020-11-07 MED ORDER — LACTATED RINGERS IV SOLN: INTRAVENOUS | Status: DC | PRN

## 2020-11-07 SURGICAL SUPPLY — 105 items
APPLIER CLIP 11 MED OPEN (CLIP) ×4
APPLIER CLIP 9.375 MED OPEN (MISCELLANEOUS) ×4
BAG DECANTER FOR FLEXI CONT (MISCELLANEOUS) ×4 IMPLANT
BENZOIN TINCTURE PRP APPL 2/3 (GAUZE/BANDAGES/DRESSINGS) IMPLANT
BINDER BREAST XXLRG (GAUZE/BANDAGES/DRESSINGS) IMPLANT
BIOPATCH RED 1 DISK 7.0 (GAUZE/BANDAGES/DRESSINGS) ×8 IMPLANT
BLADE SURG 15 STRL LF DISP TIS (BLADE) IMPLANT
BLADE SURG 15 STRL SS (BLADE)
BNDG ELASTIC 6X5.8 VLCR STR LF (GAUZE/BANDAGES/DRESSINGS) ×8 IMPLANT
BNDG GAUZE ELAST 4 BULKY (GAUZE/BANDAGES/DRESSINGS) IMPLANT
CANISTER SUCT 3000ML PPV (MISCELLANEOUS) ×4 IMPLANT
CHLORAPREP W/TINT 10.5 ML (MISCELLANEOUS) ×4 IMPLANT
CHLORAPREP W/TINT 26 (MISCELLANEOUS) ×8 IMPLANT
CLIP APPLIE 11 MED OPEN (CLIP) ×3 IMPLANT
CLIP APPLIE 9.375 MED OPEN (MISCELLANEOUS) ×3 IMPLANT
CNTNR URN SCR LID CUP LEK RST (MISCELLANEOUS) ×3 IMPLANT
CONT SPEC 4OZ STRL OR WHT (MISCELLANEOUS) ×4
COVER PROBE W GEL 5X96 (DRAPES) ×4 IMPLANT
COVER SURGICAL LIGHT HANDLE (MISCELLANEOUS) ×4 IMPLANT
COVER WAND RF STERILE (DRAPES) IMPLANT
DECANTER SPIKE VIAL GLASS SM (MISCELLANEOUS) ×4 IMPLANT
DERMABOND ADVANCED (GAUZE/BANDAGES/DRESSINGS) ×1
DERMABOND ADVANCED .7 DNX12 (GAUZE/BANDAGES/DRESSINGS) ×3 IMPLANT
DRAIN CHANNEL 15F RND FF W/TCR (WOUND CARE) ×8 IMPLANT
DRAIN CHANNEL 19F RND (DRAIN) IMPLANT
DRAPE CHEST BREAST 15X10 FENES (DRAPES) IMPLANT
DRAPE HALF SHEET 70X43 (DRAPES) ×8 IMPLANT
DRAPE LAPAROTOMY 100X72 PEDS (DRAPES) IMPLANT
DRAPE ORTHO SPLIT 77X108 STRL (DRAPES) ×16
DRAPE SURG ORHT 6 SPLT 77X108 (DRAPES) ×12 IMPLANT
DRAPE UTILITY XL STRL (DRAPES) ×4 IMPLANT
DRSG PAD ABDOMINAL 8X10 ST (GAUZE/BANDAGES/DRESSINGS) ×12 IMPLANT
DRSG TEGADERM 4X10 (GAUZE/BANDAGES/DRESSINGS) IMPLANT
DRSG TEGADERM 4X4.75 (GAUZE/BANDAGES/DRESSINGS) ×8 IMPLANT
ELECT BLADE 4.0 EZ CLEAN MEGAD (MISCELLANEOUS) ×4
ELECT CAUTERY BLADE 6.4 (BLADE) ×4 IMPLANT
ELECT COATED BLADE 2.86 ST (ELECTRODE) ×4 IMPLANT
ELECT REM PT RETURN 9FT ADLT (ELECTROSURGICAL) ×8
ELECTRODE BLDE 4.0 EZ CLN MEGD (MISCELLANEOUS) ×3 IMPLANT
ELECTRODE REM PT RTRN 9FT ADLT (ELECTROSURGICAL) ×6 IMPLANT
EVACUATOR SILICONE 100CC (DRAIN) ×8 IMPLANT
GAUZE 4X4 16PLY RFD (DISPOSABLE) IMPLANT
GAUZE SPONGE 4X4 12PLY STRL (GAUZE/BANDAGES/DRESSINGS) ×8 IMPLANT
GAUZE SPONGE 4X4 12PLY STRL LF (GAUZE/BANDAGES/DRESSINGS) ×4 IMPLANT
GLOVE BIO SURGEON STRL SZ7 (GLOVE) ×4 IMPLANT
GLOVE BIOGEL M 7.0 STRL (GLOVE) ×4 IMPLANT
GLOVE BIOGEL M STRL SZ7.5 (GLOVE) ×8 IMPLANT
GLOVE BIOGEL PI IND STRL 7.5 (GLOVE) ×3 IMPLANT
GLOVE BIOGEL PI INDICATOR 7.5 (GLOVE) ×1
GLOVE SRG 8 PF TXTR STRL LF DI (GLOVE) IMPLANT
GLOVE SURG UNDER POLY LF SZ8 (GLOVE)
GOWN STRL REUS W/ TWL LRG LVL3 (GOWN DISPOSABLE) ×15 IMPLANT
GOWN STRL REUS W/TWL LRG LVL3 (GOWN DISPOSABLE) ×20
GRAFT FLEX HD 19X22X0.7-1.4 (Tissue) ×8 IMPLANT
IMPL BREAST EXPANDER 700CC (Breast) ×6 IMPLANT
IMPLANT BREAST EXPANDER 700CC (Breast) ×8 IMPLANT
KIT BASIN OR (CUSTOM PROCEDURE TRAY) ×8 IMPLANT
KIT FILL ASEPTIC TRANSFER (MISCELLANEOUS) ×4 IMPLANT
KIT FILL SYSTEM UNIVERSAL (SET/KITS/TRAYS/PACK) IMPLANT
KIT TURNOVER KIT B (KITS) ×4 IMPLANT
MARKER SKIN DUAL TIP RULER LAB (MISCELLANEOUS) IMPLANT
NEEDLE 18GX1X1/2 (RX/OR ONLY) (NEEDLE) ×4 IMPLANT
NEEDLE FILTER BLUNT 18X 1/2SAF (NEEDLE)
NEEDLE FILTER BLUNT 18X1 1/2 (NEEDLE) IMPLANT
NEEDLE HYPO 25GX1X1/2 BEV (NEEDLE) ×4 IMPLANT
NEEDLE HYPO 25X1 1.5 SAFETY (NEEDLE) ×4 IMPLANT
NS IRRIG 1000ML POUR BTL (IV SOLUTION) ×4 IMPLANT
PACK GENERAL/GYN (CUSTOM PROCEDURE TRAY) ×8 IMPLANT
PACK SPY-PHI (KITS) ×4 IMPLANT
PAD ABD 8X10 STRL (GAUZE/BANDAGES/DRESSINGS) ×8 IMPLANT
PAD ARMBOARD 7.5X6 YLW CONV (MISCELLANEOUS) ×8 IMPLANT
PENCIL SMOKE EVACUATOR (MISCELLANEOUS) ×8 IMPLANT
PIN SAFETY STERILE (MISCELLANEOUS) ×4 IMPLANT
SLEEVE SCD COMPRESS KNEE MED (STOCKING) ×4 IMPLANT
SPONGE LAP 18X18 RF (DISPOSABLE) ×24 IMPLANT
STAPLER INSORB 30 2030 C-SECTI (MISCELLANEOUS) IMPLANT
STAPLER VISISTAT 35W (STAPLE) ×4 IMPLANT
STRIP CLOSURE SKIN 1/2X4 (GAUZE/BANDAGES/DRESSINGS) ×4 IMPLANT
SUT ETHILON 2 0 FS 18 (SUTURE) IMPLANT
SUT ETHILON 3 0 PS 1 (SUTURE) ×8 IMPLANT
SUT MNCRL AB 4-0 PS2 18 (SUTURE) IMPLANT
SUT MON AB 3-0 SH 27 (SUTURE) ×8
SUT MON AB 3-0 SH27 (SUTURE) ×6 IMPLANT
SUT MON AB 4-0 PC3 18 (SUTURE) ×16 IMPLANT
SUT PDS AB 3-0 SH 27 (SUTURE) ×36 IMPLANT
SUT PLAIN 5 0 P 3 18 (SUTURE) IMPLANT
SUT SILK 2 0 SH (SUTURE) IMPLANT
SUT VIC AB 2-0 SH 18 (SUTURE) IMPLANT
SUT VIC AB 2-0 SH 27 (SUTURE)
SUT VIC AB 2-0 SH 27XBRD (SUTURE) IMPLANT
SUT VIC AB 3-0 SH 18 (SUTURE) ×8 IMPLANT
SUT VIC AB 3-0 SH 27 (SUTURE) ×4
SUT VIC AB 3-0 SH 27X BRD (SUTURE) IMPLANT
SUT VIC AB 3-0 SH 27XBRD (SUTURE) ×3 IMPLANT
SUT VLOC 180 0 24IN GS25 (SUTURE) ×8 IMPLANT
SUT VLOC 90 P-14 23 (SUTURE) ×8 IMPLANT
SYR 50ML LL SCALE MARK (SYRINGE) ×8 IMPLANT
SYR BULB IRRIG 60ML STRL (SYRINGE) ×4 IMPLANT
SYR CONTROL 10ML LL (SYRINGE) ×4 IMPLANT
TAPE MEASURE VINYL STERILE (MISCELLANEOUS) IMPLANT
TAPE STRIPS DRAPE STRL (GAUZE/BANDAGES/DRESSINGS) ×4 IMPLANT
TOWEL GREEN STERILE (TOWEL DISPOSABLE) ×4 IMPLANT
TOWEL GREEN STERILE FF (TOWEL DISPOSABLE) ×12 IMPLANT
TRAY FOLEY W/BAG SLVR 14FR LF (SET/KITS/TRAYS/PACK) ×4 IMPLANT
TUBE CONNECTING 20X1/4 (TUBING) ×4 IMPLANT

## 2020-11-07 NOTE — Anesthesia Postprocedure Evaluation (Signed)
Anesthesia Post Note  Patient: Sheryl Mcmillan  Procedure(s) Performed: BILATERAL MASTECTOMY (Bilateral Breast) LEFT AXILLARY SENTINEL NODE BIOPSY (Left Breast) REMOVAL PORT-A-CATH (Right Breast) BILATERAL BREAST RECONSTRUCTION WITH PLACEMENT OF TISSUE EXPANDER AND FLEX HD (ACELLULAR HYDRATED DERMIS) (Bilateral Breast)     Patient location during evaluation: PACU Anesthesia Type: General Level of consciousness: awake and alert and oriented Pain management: pain level controlled Vital Signs Assessment: post-procedure vital signs reviewed and stable Respiratory status: spontaneous breathing, nonlabored ventilation and respiratory function stable Cardiovascular status: blood pressure returned to baseline Postop Assessment: no apparent nausea or vomiting Anesthetic complications: no   No complications documented.  Last Vitals:  Vitals:   11/07/20 1245 11/07/20 1300  BP: (!) 95/59 114/64  Pulse: 93 (!) 103  Resp: 13 15  Temp:  36.7 C  SpO2: 95% 94%    Last Pain:  Vitals:   11/07/20 1300  TempSrc:   PainSc: [place]

## 2020-11-07 NOTE — Anesthesia Procedure Notes (Signed)
Anesthesia Regional Block: Pectoralis block   Pre-Anesthetic Checklist: ,, timeout performed, Correct Patient, Correct Site, Correct Laterality, Correct Procedure, Correct Position, site marked, Risks and benefits discussed, pre-op evaluation,  At surgeon's request and post-op pain management  Laterality: Right  Prep: Maximum Sterile Barrier Precautions used, chloraprep       Needles:  Injection technique: Single-shot  Needle Type: Echogenic Stimulator Needle     Needle Length: 9cm  Needle Gauge: 22     Additional Needles:   Procedures:,,,, ultrasound used (permanent image in chart),,,,  Narrative:  Start time: 11/07/2020 7:23 AM End time: 11/07/2020 7:26 AM Injection made incrementally with aspirations every 5 mL.  Performed by: Personally  Anesthesiologist: Brennan Bailey, MD  Additional Notes: Risks, benefits, and alternative discussed. Patient gave consent for procedure. Patient prepped and draped in sterile fashion. Sedation administered, patient remains easily responsive to voice. Relevant anatomy identified with ultrasound guidance. Local anesthetic given in 5cc increments with no signs or symptoms of intravascular injection. No pain or paraesthesias with injection. Patient monitored throughout procedure with signs of LAST or immediate complications. Tolerated well. Ultrasound image placed in chart.  Tawny Asal, MD

## 2020-11-07 NOTE — Progress Notes (Signed)
Sheryl Mcmillan is a 45 y.o. female patient admitted. Awake, alert - oriented  X 4 - no acute distress noted.  VSS - Blood pressure 126/79, pulse 90, temperature 98.3 F (36.8 C), resp. rate 13, height 5\' 11"  (1.803 m), weight (!) 140.6 kg, last menstrual period 06/25/2020, SpO2 96 %.    IV in place, occlusive dsg intact without redness.  Orientation to room, and floor completed.  Admission INP armband ID verified with patient/family, and in place.   SR up x 2, fall assessment complete, with patient and family able to verbalize understanding of risk associated with falls, and verbalized understanding to call nsg before up out of bed.  Call light within reach, patient able to voice, and demonstrate understanding. No evidence of skin break down noted on exam.  Admission nurse notified of admission.     Will cont to eval and treat per MD orders.  Hezzie Bump, RN 11/07/2020 8:35 PM

## 2020-11-07 NOTE — Brief Op Note (Signed)
11/07/2020  11:51 AM  PATIENT:  Sheryl Mcmillan  45 y.o. female  PRE-OPERATIVE DIAGNOSIS:  BRCA POSITIVE, LEFT BREAST CANCER  POST-OPERATIVE DIAGNOSIS:  BRCA POSITIVE, LEFT BREAST CANCER  PROCEDURE:  Procedure(s): BILATERAL MASTECTOMY (Bilateral) LEFT AXILLARY SENTINEL NODE BIOPSY (Left) REMOVAL PORT-A-CATH (Right) BILATERAL BREAST RECONSTRUCTION WITH PLACEMENT OF TISSUE EXPANDER AND FLEX HD (ACELLULAR HYDRATED DERMIS) (Bilateral)  SURGEON:  Surgeon(s) and Role: Panel 1:    [name], MD - Primary Panel 2:    * [name], MD - Primary  PHYSICIAN ASSISTANT: [place], RNFA  ASSISTANTS: none   ANESTHESIA:   general  EBL:  350 mL   BLOOD ADMINISTERED:none  DRAINS: (2) Jackson-Pratt drain(s) with closed bulb suction in the chest   LOCAL MEDICATIONS USED:  MARCAINE     SPECIMEN:  No Specimen  DISPOSITION OF SPECIMEN:  PATHOLOGY  COUNTS:  YES  TOURNIQUET:  * No tourniquets in log *  DICTATION: .Dragon Dictation  PLAN OF CARE: Admit to inpatient   PATIENT DISPOSITION:  PACU - hemodynamically stable.   Delay start of Pharmacological VTE agent (>24hrs) due to surgical blood loss or risk of bleeding: not applicable

## 2020-11-07 NOTE — Interval H&P Note (Signed)
History and Physical Interval Note:  11/07/2020 7:13 AM  Sheryl Mcmillan  has presented today for surgery, with the diagnosis of BRCA POSITIVE, LEFT BREAST CANCER.  The various methods of treatment have been discussed with the patient and family. After consideration of risks, benefits and other options for treatment, the patient has consented to  Procedure(s): BILATERAL MASTECTOMY (Bilateral) LEFT AXILLARY SENTINEL NODE BIOPSY (Left) REMOVAL PORT-A-CATH (N/A) BILATERAL BREAST RECONSTRUCTION WITH PLACEMENT OF TISSUE EXPANDER AND FLEX HD (ACELLULAR HYDRATED DERMIS) (Bilateral) as a surgical intervention.  The patient's history has been reviewed, patient examined, no change in status, stable for surgery.  I have reviewed the patient's chart and labs.  Questions were answered to the patient's satisfaction.     [name]

## 2020-11-07 NOTE — Anesthesia Procedure Notes (Signed)
Anesthesia Regional Block: Pectoralis block   Pre-Anesthetic Checklist: ,, timeout performed, Correct Patient, Correct Site, Correct Laterality, Correct Procedure, Correct Position, site marked, Risks and benefits discussed, pre-op evaluation,  At surgeon's request and post-op pain management  Laterality: Left  Prep: Maximum Sterile Barrier Precautions used, chloraprep       Needles:  Injection technique: Single-shot  Needle Type: Echogenic Stimulator Needle     Needle Length: 9cm  Needle Gauge: 22     Additional Needles:   Procedures:,,,, ultrasound used (permanent image in chart),,,,  Narrative:  Start time: 11/07/2020 7:20 AM End time: 11/07/2020 7:22 AM Injection made incrementally with aspirations every 5 mL.  Performed by: Personally  Anesthesiologist: Brennan Bailey, MD  Additional Notes: Risks, benefits, and alternative discussed. Patient gave consent for procedure. Patient prepped and draped in sterile fashion. Sedation administered, patient remains easily responsive to voice. Relevant anatomy identified with ultrasound guidance. Local anesthetic given in 5cc increments with no signs or symptoms of intravascular injection. No pain or paraesthesias with injection. Patient monitored throughout procedure with signs of LAST or immediate complications. Tolerated well. Ultrasound image placed in chart.  Tawny Asal, MD

## 2020-11-07 NOTE — H&P (Signed)
Sheryl Mcmillan is an 45 y.o. female.   Chief Complaint: breast cancer HPI: 70 yof with left breast mass palpated in 12/21.  On [place] this was a 2.6x2.4x1.7 cm mass. Axilla negative.  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  Undergone primary chemotherapy, stopped smoking.  Mr at completion has 1.3 cm residual mass, normal nodes. Is brca 1 positive  Past Medical History:  Diagnosis Date  . Arthritis   . Breast cancer ([place])   . Family history of non-Hodgkin's lymphoma 06/01/2020  . Hemorrhoids   . Migraines   . Obesity   . PCOS (polycystic ovarian syndrome)     Past Surgical History:  Procedure Laterality Date  . COSMETIC SURGERY    . DILATION AND CURETTAGE OF UTERUS    . HEMORRHOID SURGERY    . IR IMAGING GUIDED PORT INSERTION  06/15/2020    Family History  Problem Relation Age of Onset  . Hypertension Mother   . Diabetes Mother   . Cancer Mother 32       unknown primary  . Other Mother        brain tumor; dx 46s  . Non-Hodgkin's lymphoma Brother 56  . Other Maternal Uncle 52       brain tumor  . Breast cancer Other        MGM's niece; dx late 55s  . Pancreatic cancer Neg Hx   . Colon cancer Neg Hx   . Endometrial cancer Neg Hx   . Prostate cancer Neg Hx   . Ovarian cancer Neg Hx    Social History:  reports that she quit smoking about 2 months ago. Her smoking use included cigarettes. She has a 5.00 pack-year smoking history. She has never used smokeless tobacco. She reports current alcohol use. She reports current drug use. Drug: Marijuana.  Allergies:  Allergies  Allergen Reactions  . Crest Tc-Baking Soda [Sodium Fluoride] [employer]  . Carboplatin Hives and Itching    Carboplatin reaction after dose #5 requiring Benadryl, Solumedrol, epinephrine, Claritin.  . Wound Dressing Adhesive Hives    Rips skin    Medications Prior to Admission  Medication Sig Dispense Refill  . aspirin-acetaminophen-caffeine (EXCEDRIN MIGRAINE)  250-250-65 MG tablet Take 1-2 tablets by mouth every 6 (six) hours as needed for headache. (Patient not taking: Reported on 11/03/2020)    . lidocaine-prilocaine (EMLA) cream APPLY TO AFFECTED AREA ONCE (Patient not taking: Reported on 11/03/2020) 30 g 3  . Multiple Vitamins-Minerals (ONE-A-DAY WOMENS PO) Take 1 tablet by mouth daily. (Patient not taking: Reported on 11/03/2020)    . dexamethasone (DECADRON) 4 MG tablet TAKE 2 TABLETS BY MOUTH 2 TIMES DAILY THE DAY BEFORE TAXOTERE. THEN TAKE 2 TABS DAILY FOR THREE DAYS AFTER CHEMOTHERAPY AS DIRECTED (Patient not taking: No sig reported) 30 tablet 1  . dibucaine (NUPERCAINAL) 1 % OINT PLACE 1 APPLICATION RECTALLY AS NEEDED FOR HEMORRHOIDS. (Patient not taking: Reported on 10/27/2020) 28 g 1  . diphenoxylate-atropine (LOMOTIL) 2.5-0.025 MG tablet TAKE 1 TO 2 TABLETS BY MOUTH 4 TIMES DAILY AS NEEDED FOR DIARRHEA (Patient not taking: No sig reported) 30 tablet 2  . fluconazole (DIFLUCAN) 200 MG tablet TAKE 1 TABLET BY MOUTH ONCE A DAY (Patient not taking: Reported on 10/27/2020) 7 tablet 0  . hydrocortisone (ANUSOL-HC) 2.5 % rectal cream APPLY RECTALLY TWICE A DAY (Patient not taking: Reported on 10/27/2020) 30 g 0  . hydrocortisone-pramoxine (ANALPRAM-HC) 2.5-1 %  rectal cream APPLY RECTALLY 2 TIMES DAILY AS DIRECTED (Patient not taking: Reported on 10/27/2020) 30 g 0  . LORazepam (ATIVAN) 0.5 MG tablet DISSOLVE 1 TABLET UNDER THE TONGUE EVERY 6 HOURS AS NEEDED FOR ANXIETY (Patient not taking: No sig reported) 30 tablet 0  . pramoxine-hydrocortisone (PROCTOCREAM-HC) 1-1 % rectal cream Place 1 application rectally 2 (two) times daily. (Patient not taking: Reported on 10/27/2020) 30 g 0  . prochlorperazine (COMPAZINE) 10 MG tablet TAKE 1 TABLET BY MOUTH EVERY 6 HOURS AS NEEDED FOR NAUSEA OR VOMITING (Patient not taking: No sig reported) 30 tablet 1    No results found for this or any previous visit (from the past 48 hour(s)). No results found.  Review of Systems  All  other systems reviewed and are negative.   Blood pressure (!) 158/98, pulse 85, resp. rate 18, height 5' 11"  (1.803 m), weight (!) 140.6 kg, last menstrual period 06/25/2020, SpO2 97 %. Physical Exam Constitutional:      Appearance: Normal appearance.  Cardiovascular:     Rate and Rhythm: Normal rate and regular rhythm.  Pulmonary:     Effort: Pulmonary effort is normal.     Comments: No breast mass No axillary adenopathy Abdominal:     Palpations: Abdomen is soft.  Neurological:     Mental Status: She is alert.      Assessment/Plan Right RRM, Left mastectomy with sn biopsy, port remova With expanders  [name], MD 11/07/2020, 7:10 AM

## 2020-11-07 NOTE — Anesthesia Procedure Notes (Signed)
Procedure Name: Intubation Date/Time: 11/07/2020 7:55 AM Performed by: Elenore Paddy, CRNA Pre-anesthesia Checklist: Patient identified, Emergency Drugs available, Suction available, Patient being monitored and Timeout performed Patient Re-evaluated:Patient Re-evaluated prior to induction Oxygen Delivery Method: Circle system utilized Preoxygenation: Pre-oxygenation with 100% oxygen Induction Type: IV induction Ventilation: Oral airway inserted - appropriate to patient size and Mask ventilation without difficulty Laryngoscope Size: Mac and 3 Grade View: Grade I Tube type: Oral Tube size: 7.0 mm Number of attempts: 1 Airway Equipment and Method: Stylet Placement Confirmation: ETT inserted through vocal cords under direct vision,  positive ETCO2 and breath sounds checked- equal and bilateral Secured at: 22 cm Tube secured with: Tape Dental Injury: Teeth and Oropharynx as per pre-operative assessment

## 2020-11-07 NOTE — Progress Notes (Signed)
Patient ID: Sheryl Mcmillan, female    DOB: [birth year], 45 y.o.   MRN: [id number]  No chief complaint on file.   No diagnosis found. Left Breast Cancer   History of Present Illness: Sheryl Mcmillan is a 45 y.o.  female  with a history of Left Breast Cancer/Estrogen positive.  She presents for preoperative evaluation for upcoming procedure, Bilateral Mastectomy- Dr. Donne Hazel & Reconstruction with bilateral tissue expanders/Flex HD-Dr. [name]  scheduled for: 11/07/20  The patient has not had problems with anesthesia.   Summary of Previous Visit: consult for bilateral breast reconstruction  Job: n/a  PMH Significant for: breast Ca - chemo, BMI = 43.24 Denies smoking cigarettes- but occasional THC   Past Medical History: Allergies: Allergies  Allergen Reactions  . Crest Tc-Baking Soda [Sodium Fluoride] [employer]  . Carboplatin Hives and Itching    Carboplatin reaction after dose #5 requiring Benadryl, Solumedrol, epinephrine, Claritin.  . Wound Dressing Adhesive Hives    Rips skin    Current Medications: No current facility-administered medications for this visit. No current outpatient medications on file.  Facility-Administered Medications Ordered in Other Visits:  .  0.9 % irrigation (POUR BTL), , , PRN, [name], MD, 1,000 mL at 11/07/20 0820 .  acetaminophen (TYLENOL) 500 MG tablet, , , ,  .  ANCEF 1 gram in 0.9% normal saline 1000 mL, , , PRN, [name], MD, 1,000 mL at 11/07/20 0843 .  bupivacaine liposome (EXPAREL) 1.3 % injection, , Peri-NEURAL, Anesthesia Intra-op, Brennan Bailey, MD, 10 mL at 11/07/20 0726 .  bupivacaine-epinephrine (MARCAINE W/ EPI) 0.25% -1:200000 injection, , Peri-NEURAL, Anesthesia Intra-op, Brennan Bailey, MD, 20 mL at 11/07/20 0726 .  dexamethasone (DECADRON) injection, , Intravenous, Anesthesia Intra-op, Rhymer, Colleen S, CRNA, 10 mg at 11/07/20 0823 .  [START ON 11/08/2020] feeding supplement (ENSURE  PRE-SURGERY) liquid 296 mL, 296 mL, Oral, Once, [name], MD .  fentaNYL (SUBLIMAZE) injection, , Intravenous, Anesthesia Intra-op, Rhymer, Waynard Reeds, CRNA, 50 mcg at 11/07/20 0841 .  ketorolac (TORADOL) 15 MG/ML injection 15 mg, 15 mg, Intravenous, On Call to OR, [name], MD .  ketorolac (TORADOL) 15 MG/ML injection, , , ,  .  ketorolac (TORADOL) 30 MG/ML injection, , Intravenous, Anesthesia Intra-op, Rhymer, Colleen S, CRNA, 15 mg at 11/07/20 0805 .  lactated ringers infusion, , Intravenous, Continuous, Myrtie Soman, MD .  lactated ringers infusion, , Intravenous, Continuous PRN, Rhymer, Waynard Reeds, CRNA, New Bag at 11/07/20 [phone] .  lidocaine (cardiac) 100 mg/55mL (XYLOCAINE) injection 2%, , Intravenous, Anesthesia Intra-op, Rhymer, Colleen S, CRNA, 100 mg at 11/07/20 0751 .  lidocaine 2% (20 mg/mL) 5 mL syringe, , Intravenous, Anesthesia Intra-op, Rhymer, Colleen S, CRNA, 100 mg at 11/07/20 0751 .  methylene blue 0.5% 4 mL in 0.9% normal saline 1 mL injection, , , PRN, [name], MD, 5 mL at 11/07/20 0820 .  midazolam (VERSED) injection, , Intravenous, Anesthesia Intra-op, Rhymer, Colleen S, CRNA, 2 mg at 11/07/20 0653 .  phenylephrine (NEO-SYNEPHRINE) injection, , Intravenous, Anesthesia Intra-op, Rhymer, Colleen S, CRNA, 40 mcg at 11/07/20 0821 .  propofol (DIPRIVAN) 10 mg/mL bolus/IV push, , Intravenous, Anesthesia Intra-op, Rhymer, Colleen S, CRNA, 200 mg at 11/07/20 0752 .  rocuronium bromide 10 mg/mL (PF) syringe, , Intravenous, Anesthesia Intra-op, Rhymer, Colleen S, CRNA, 20 mg at 11/07/20 0842 .  scopolamine (TRANSDERM-SCOP) 1 MG/3DAYS 1.5 mg, 1 patch, Transdermal, Once, Brennan Bailey, MD, 1.5 mg at 11/07/20 0605 .  scopolamine (TRANSDERM-SCOP) 1 MG/3DAYS, , , ,  .  sodium chloride flush (NS) 0.9 % injection 10 mL, 10 mL, Intravenous, PRN, Causey, Charlestine Massed, NP  Past Medical Problems: Past Medical History:  Diagnosis Date  . Arthritis   . Breast  cancer ([place])   . Family history of non-Hodgkin's lymphoma 06/01/2020  . Hemorrhoids   . Migraines   . Obesity   . PCOS (polycystic ovarian syndrome)     Past Surgical History: Past Surgical History:  Procedure Laterality Date  . COSMETIC SURGERY    . DILATION AND CURETTAGE OF UTERUS    . HEMORRHOID SURGERY    . IR IMAGING GUIDED PORT INSERTION  06/15/2020    Social History: Social History   Socioeconomic History  . Marital status: Single    Spouse name: Not on file  . Number of children: 1  . Years of education: Not on file  . Highest education level: Some college, no degree  Occupational History  . Not on file  Tobacco Use  . Smoking status: Former Smoker    Packs/day: 0.50    Years: 10.00    Pack years: 5.00    Types: Cigarettes    Quit date: 08/23/2020    Years since quitting: 0.2  . Smokeless tobacco: Never Used  Vaping Use  . Vaping Use: Never used  Substance and Sexual Activity  . Alcohol use: Yes    Comment: occasionally  . Drug use: Yes    Types: Marijuana    Comment: 5-7 days/week  . Sexual activity: Yes    Birth control/protection: None  Other Topics Concern  . Not on file  Social History Narrative  . Not on file   Social Determinants of Health   Financial Resource Strain: Not on file  Food Insecurity: Not on file  Transportation Needs: No Transportation Needs  . Lack of Transportation (Medical): No  . Lack of Transportation (Non-Medical): No  Physical Activity: Not on file  Stress: Not on file  Social Connections: Not on file  Intimate Partner Violence: Not on file    Family History: Family History  Problem Relation Age of Onset  . Hypertension Mother   . Diabetes Mother   . Cancer Mother 36       unknown primary  . Other Mother        brain tumor; dx 70s  . Non-Hodgkin's lymphoma Brother 77  . Other Maternal Uncle 52       brain tumor  . Breast cancer Other        MGM's niece; dx late 19s  . Pancreatic cancer Neg Hx   . Colon  cancer Neg Hx   . Endometrial cancer Neg Hx   . Prostate cancer Neg Hx   . Ovarian cancer Neg Hx     Review of Systems: ROS  No asthma/sleep apnea/ no oxygen use/ No angina/or hx of MI- had ECHO-per Dr. Jana Hakim on 09/14/20 see full results= left ventricle EF 60-70% Right lower leg U/S - venous doppler for leg pain on 06/06/2019- negative for DVT/ no current varicose veins or lower leg pain/swelling in feet or ankles  Peripheral Neuropathy d/t chemo She is now on Immunotherapy course for 9 months   Physical Exam: Vital Signs BP 115/66 (BP Location: Right Arm, Patient Position: Sitting, Cuff Size: Large)   Pulse 75   Temp 97.6 F (36.4 C) (Temporal)   Ht 5\' 11"  (1.803 m)   Wt (!) 310 lb (140.6 kg)   LMP  09/29/2020 (Approximate)   SpO2 96%   BMI 43.24 kg/m   Physical Exam  Constitutional:      General: Not in acute distress.    Appearance: Normal appearance. Not ill-appearing.  HENT:     Head: Normocephalic and atraumatic.  Eyes:     Pupils: Pupils are equal, round Neck:     Musculoskeletal: Normal range of motion.  Cardiovascular:     Rate and Rhythm: Normal rate    Pulses: Normal pulses.  Pulmonary:     Effort: Pulmonary effort is normal. No respiratory distress.  Abdominal:     General: Abdomen is flat. There is no distension.  Musculoskeletal: Normal range of motion.  Skin:    General: Skin is warm and dry.     Findings: No erythema or rash.  Neurological:     General: No focal deficit present.     Mental Status: Alert and oriented to person, place, and time. Mental status is at baseline.     Motor: No weakness.  Psychiatric:        Mood and Affect: Mood normal.        Behavior: Behavior normal.    Assessment/Plan: The patient is scheduled for bilateral breast reconstruction with tissue expanders and flex HD placement with Dr. [name].  Risks, benefits, and alternatives of procedure discussed, questions answered by myself & by Dr. [name]  signed consent  obtained- copy scanned into chart  Smoking Status: does not smoke cigarettes- but states she smokes THC occasionally; Counseling Given? yes Last Mammogram: 04/2020; Results: left breast cancer  Caprini Score: 8; Risk Factors include: , BMI =43.24 and length of planned surgery. Recommendation for mechanical pharmacological prophylaxis. Encourage early ambulation.   Pictures obtained: at consultation  Post-op Rx sent to pharmacy: by Dr. [name]  Patient was provided with the  General Surgical Risk consent document and Pain Medication Agreement prior to their appointment.  They had adequate time to read through the risk consent documents and Pain Medication Agreement. We also discussed them in person together during this preop appointment. All of their questions were answered to their satisfaction.  Recommended calling if they have any further questions.  Risk consent form and Pain Medication Agreement to be scanned into patient's chart.     Electronically signed by: [place], RN 11/07/2020 8:58 AM

## 2020-11-07 NOTE — Op Note (Signed)
Preoperative diagnosis: BRCA I mutation, left breast cancer s/p primary chemotherapy Postoperative diagnosis: saa Procedure: 1. Right risk reducing mastectomy 2. Left mastectomy 3. Left axillary sentinel node biopsy 4. Injection of blue dye for sentinel node identification 5. Port removal Surgeon: Dr Serita Grammes Asst Ewell Poe, RNFA EBL: 542 cc Complications none Drains per plastic surgery Specimens: 1. Right breast marked short superior, long lateral 2. Left breast marked short superior, long lateral 3. Left axillary sn with highest count of 3754 4. Port Sponge and needle count correct dispo recovery stable.  Indications: 79 yof with left breast mass palpated in 12/21.  On [place] this was a 2.6x2.4x1.7 cm mass. Axilla negative.  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  Undergone primary chemotherapy, stopped smoking.  MR at completion has 1.3 cm residual mass, normal nodes. Is brca 1 positive. We have discussed bilateral mastectomies, ax sn biopsy. She also desires port removal  Procedure: After informed consent obtained patient taken to the OR. She had undergone pectoral blocks.  SCDs were in placed. Antibiotics were given.  She was placed under general anesthesia without complication. She was prepped and draped in the standard sterile surgical fashion. A surgical timeout was performed.  I injected methylene blue dye and saline on left side in periareolar position and massaged this.  I first did the risk reducing right side.  I used markings made by Dr [name] and then made an incision. I created flaps to the clavicle, parasternal area, inframammary fold and the latissiumus laterally. I lost 250 cc of blood on this side as it was very vascular. I placed numerous clips and stitches on this side. I then removed the breast and the fascia from the muscle.  This was marked and passed off the table. I marked as above. I also identified the port under the skin. I  entered the pocket and then removed it in its entirety. I sutured the pocket with 3-0 vicryl.   I then obtained hemostasis and packed this side.   I then did the left side which was the cancer side.  I used markings made by Dr [name] and then made an incision. I created flaps to the clavicle, parasternal area, inframammary fold and the latissiumus laterally. I then removed the breast and the fascia from the muscle.  This was marked and passed off the table. I marked as above. I then entered the left axilla. There were what appeared to be several radioactive nodes that I removed. There was no background radioactivity.  I then obtained hemostasis and packed this side. I turned the case over to plastic surgery

## 2020-11-07 NOTE — Op Note (Signed)
Operative Note   DATE OF OPERATION: 11/07/2020  SURGICAL DEPARTMENT: Plastic Surgery  PREOPERATIVE DIAGNOSES: Bilateral mastectomy defects  POSTOPERATIVE DIAGNOSES:  same  PROCEDURE: 1.  Bilateral breast reconstruction with tissue expander and acellular dermal matrix 2.  Indocyanine green angiography of bilateral mastectomy flaps  SURGEON: Talmadge Coventry, MD  ASSISTANT: [place], RNFA The advanced practice practitioner (APP) assisted throughout the case.  The APP was essential in retraction and counter traction when needed to make the case progress smoothly.  This retraction and assistance made it possible to see the tissue plans for the procedure.  The assistance was needed for blood control, tissue re-approximation and assisted with closure of the incision site.  ANESTHESIA:  General.   COMPLICATIONS: None.   INDICATIONS FOR PROCEDURE:  The patient, Sheryl Mcmillan is a 45 y.o. female born on Jul 21, 1975, is here for treatment of bilateral mastectomy defects MRN: [id number]  CONSENT:  Informed consent was obtained directly from the patient. Risks, benefits and alternatives were fully discussed. Specific risks including but not limited to bleeding, infection, hematoma, seroma, scarring, pain, contracture, asymmetry, wound healing problems, and need for further surgery were all discussed. The patient did have an ample opportunity to have questions answered to satisfaction.   DESCRIPTION OF PROCEDURE:  The patient was taken to the operating room. SCDs were placed and antibiotics were given.  General anesthesia was administered.  The patient's operative site was prepped and draped in a sterile fashion. A time out was performed and all information was confirmed to be correct.  Dr. Donne Hazel performed his portion of the case and then turned the patient over to me.  I examined the flaps and clinically they looked good.  I ensured meticulous hemostasis which was present.  I then used  indocyanine green angiography to evaluate bilateral mastectomy flaps and they demonstrated good perfusion.  I then used a 0 V-Loc to tack down the lateral soft tissues in the axilla to close off the dead space on both sides.  I then prepared the excess skin by de-epithelializing a triangle on the superior flap and de-epithelializing the inferior flap so that these could be overlapped and not compromised perfusion.  This was done on both sides.  15 French drains were then inserted and secured with 3-0 nylon sutures.  The expanders were then brought onto the field.  I elected to use Mentor Arturo +700 cc tissue expanders.  Serial number for the left side is [id number].  Serial number for the right side is [id number].  Expanders were removed of all air.  I then brought 2 pieces of Flex HD pliable pre onto the field and these were soaked in saline and rinsed for several minutes.  3-0 PDS was then run around the periphery of the Flex HD and the expanders were then inserted within that and the suture was tied down as a pursestring.  Suture tabs were brought out inferiorly medially and laterally.  Both pockets were irrigated with antibiotic solution.  I then placed 3-0 PDS stay sutures in the chest wall to secure the suture tabs 2.  These were then passed through the suture tabs and tied down to secure the expanders to the chest wall.  They are each inflated with 250 cc of air to minimize any tension on the mastectomy flaps.  Mastectomy flaps were then temporarily brought together with staples to ensure appropriate alignment which was the case.  Indocyanine green angiography was repeated and a few small areas were removed that seem  to fill slowly.  Closure was then done with interrupted buried 3-0 PDS sutures, and sorb stapler and a running 3 OV lock.  Steri-Strips and a soft dressing were then applied.  The patient tolerated the procedure well.  There were no complications. The patient was allowed to wake from  anesthesia, extubated and taken to the recovery room in satisfactory condition.

## 2020-11-07 NOTE — Patient Instructions (Signed)
Pt will call for any changes or concerns She was given her schedule for the following: Covid test, surgery, post-op visits

## 2020-11-07 NOTE — Transfer of Care (Signed)
Immediate Anesthesia Transfer of Care Note  Patient: Sheryl Mcmillan  Procedure(s) Performed: BILATERAL MASTECTOMY (Bilateral Breast) LEFT AXILLARY SENTINEL NODE BIOPSY (Left Breast) REMOVAL PORT-A-CATH (Right Breast) BILATERAL BREAST RECONSTRUCTION WITH PLACEMENT OF TISSUE EXPANDER AND FLEX HD (ACELLULAR HYDRATED DERMIS) (Bilateral Breast)  Patient Location: PACU  Anesthesia Type:General  Level of Consciousness: awake, alert  and oriented  Airway & Oxygen Therapy: Patient Spontanous Breathing and Patient connected to nasal cannula oxygen  Post-op Assessment: Report given to RN and Post -op Vital signs reviewed and stable  Post vital signs: Reviewed and stable  Last Vitals:  Vitals Value Taken Time  BP 94/51 11/07/20 1159  Temp 36.7 C 11/07/20 1200  Pulse 86 11/07/20 1203  Resp 13 11/07/20 1203  SpO2 95 % 11/07/20 1203  Vitals shown include unvalidated device data.  Last Pain:  Vitals:   11/07/20 0613  TempSrc: Oral  PainSc:          Complications: No complications documented.

## 2020-11-08 ENCOUNTER — Other Ambulatory Visit (HOSPITAL_COMMUNITY): Payer: Self-pay

## 2020-11-08 ENCOUNTER — Encounter (HOSPITAL_COMMUNITY): Payer: Self-pay | Admitting: General Surgery

## 2020-11-08 ENCOUNTER — Telehealth (INDEPENDENT_AMBULATORY_CARE_PROVIDER_SITE_OTHER): Payer: Medicaid Other

## 2020-11-08 DIAGNOSIS — Z170 Estrogen receptor positive status [ER+]: Secondary | ICD-10-CM

## 2020-11-08 DIAGNOSIS — C50912 Malignant neoplasm of unspecified site of left female breast: Secondary | ICD-10-CM | POA: Diagnosis not present

## 2020-11-08 DIAGNOSIS — C50412 Malignant neoplasm of upper-outer quadrant of left female breast: Secondary | ICD-10-CM

## 2020-11-08 NOTE — Progress Notes (Signed)
IV discontinued, discharge paperwork provided and education given to patient and patient's daughter. Patient discharged home with daughter.

## 2020-11-08 NOTE — Discharge Summary (Signed)
Physician Discharge Summary  Patient ID: Sheryl Mcmillan MRN: [id number] DOB/AGE: 12-29-1975 45 y.o.  Admit date: 11/07/2020 Discharge date: 11/08/2020  Admission Diagnoses: Breast Cancer  Discharge Diagnoses:  Active Problems:   Breast cancer [facility])   Discharged Condition: good  Hospital Course: Tolerated surgery well  Consults: None  Significant Diagnostic Studies: None  Treatments: surgery: See notes  Discharge Exam: Blood pressure (!) 150/99, pulse 95, temperature 98.5 F (36.9 C), temperature source Oral, resp. rate 18, height 5\' 11"  (1.803 m), weight (!) 140.6 kg, last menstrual period 06/25/2020, SpO2 99 %. General appearance: alert  Disposition: Discharge disposition: 01-Home or Self Care       Discharge Instructions    Diet - low sodium heart healthy   Complete by: As directed    Increase activity slowly   Complete by: As directed      Allergies as of 11/08/2020      Reactions   Carboplatin Hives, Itching   Carboplatin reaction after dose #5 requiring Benadryl, Solumedrol, epinephrine, Claritin.   Other Hives   Baking Soda   Wound Dressing Adhesive Hives   Rips skin      Medication List    TAKE these medications   aspirin-acetaminophen-caffeine 250-250-65 MG tablet Commonly known as: EXCEDRIN MIGRAINE Take 1-2 tablets by mouth every 6 (six) hours as needed for headache.   dexamethasone 4 MG tablet Commonly known as: DECADRON TAKE 2 TABLETS BY MOUTH 2 TIMES DAILY THE DAY BEFORE TAXOTERE. THEN TAKE 2 TABS DAILY FOR THREE DAYS AFTER CHEMOTHERAPY AS DIRECTED   dibucaine 1 % Oint Commonly known as: NUPERCAINAL PLACE 1 APPLICATION RECTALLY AS NEEDED FOR HEMORRHOIDS.   diphenoxylate-atropine 2.5-0.025 MG tablet Commonly known as: LOMOTIL TAKE 1 TO 2 TABLETS BY MOUTH 4 TIMES DAILY AS NEEDED FOR DIARRHEA   fluconazole 200 MG tablet Commonly known as: DIFLUCAN TAKE 1 TABLET BY MOUTH ONCE A DAY   HYDROcodone-acetaminophen 5-325 MG tablet Commonly  known as: Norco Take 1 tablet by mouth every 4 (four) hours as needed for moderate pain.   lidocaine-prilocaine cream Commonly known as: EMLA APPLY TO AFFECTED AREA ONCE   LORazepam 0.5 MG tablet Commonly known as: ATIVAN DISSOLVE 1 TABLET UNDER THE TONGUE EVERY 6 HOURS AS NEEDED FOR ANXIETY   ondansetron 4 MG tablet Commonly known as: Zofran Take 1 tablet (4 mg total) by mouth every 8 (eight) hours as needed for nausea or vomiting.   ONE-A-DAY WOMENS PO Take 1 tablet by mouth daily.   prochlorperazine 10 MG tablet Commonly known as: COMPAZINE TAKE 1 TABLET BY MOUTH EVERY 6 HOURS AS NEEDED FOR NAUSEA OR VOMITING   Proctozone-HC 2.5 % rectal cream Generic drug: hydrocortisone APPLY RECTALLY TWICE A DAY   sulfamethoxazole-trimethoprim 800-160 MG tablet Commonly known as: BACTRIM DS Take 1 tablet by mouth 2 (two) times daily.       Follow-up Information    [name], MD In 2 weeks.   Specialty: General Surgery Contact information: [address] [place] [address] [phone]        [name], MD Follow up.   Specialty: Plastic Surgery Contact information: [place] [id number] [phone]               Signed: [name] 11/08/2020, 2:53 PM

## 2020-11-08 NOTE — Telephone Encounter (Signed)
  F/U-post-op visit with pt in her room #6/6North:s/p bilateral mastectomies/reconstruction with tissue expanders. Pt is doing well-ace wraps are in place & bilateral drains are intact- draining ~38 (R) & 35 (L) for 24 hrs.she reports pain level 6/10 R > L.she has mild swelling right hand/fingers & arm. Pulses are intact/equal & capillary refill is normal.she does have an IV/saline lock in the right forearm, but no infiltration noted. She indicated that the right side breast pain radiates toward her back- but on inspection I did not see any marked swelling/redness or other signs of complications on the right or the left. She denies any n/v and she is taking fluids & eating- normal bowel/bladder functions. Her daughter is in the room with her & she plans to take her home & will remain with her for post-op care. She verbalized an understanding of the drains/documentation of the amount of drainage. She will also accompany the pt to her post-op visit f/u with Dr. [name] next week. They will call for any concerns.

## 2020-11-08 NOTE — Discharge Instructions (Signed)
Activity As tolerated: NO showers for 3 days No heavy activities  Diet: Regular  Wound Care: Keep dressing clean & dry for 3 days.  Keep wrap applied as much as possible.  Does not need to be tight, just gentle support.  Do not change dressings for 3 days unless soiled.  Can change if needed but make sure to reapply wrap. After three days can remove wrap and shower.  Then reapply dressings if needed and continue compression with wrap or soft sports bra. Call doctor if any unusual problems occur such as pain, excessive bleeding, unrelieved nausea/vomiting, fever &/or chills  Follow-up appointment: Scheduled for next week.

## 2020-11-08 NOTE — Progress Notes (Signed)
Sheryl Mcmillan  1 Day Post-Op  Subjective: CC-  Sore from surgery yesterday, right side worse than the left. Pain medication helps. Patient daughter have practiced JP drain management with nurse and feel comfortable with this. Anxious to know path results.  Objective: Vital signs in last 24 hours: Temp:  [98 F (36.7 C)-98.7 F (37.1 C)] 98.5 F (36.9 C) (05/17 1012) Pulse Rate:  [78-103] 95 (05/17 1012) Resp:  [12-18] 18 (05/17 1012) BP: (92-150)/(51-99) 150/99 (05/17 1012) SpO2:  [93 %-99 %] 99 % (05/17 1012)    Intake/Output from previous day: 05/16 0701 - 05/17 0700 In: 2900 [P.O.:500; I.V.:2300; IV Piggyback:100] Out: 620 [Urine:200; Drains:70; Blood:350] Intake/Output this shift: Total I/O In: -  Out: 73 [Drains:73]  PE: Gen:  Alert, NAD, pleasant Pulm:  rate and effort normal Abd: Soft, NT/ND Skin: no rashes noted, warm and dry Breast: JP drain x2 with serosanguinous fluid in bulbs, incisions cdi with steri strips in place/ no signs of infection  Lab Results:  No results for input(s): WBC, HGB, HCT, PLT in the last 72 hours. BMET No results for input(s): NA, K, CL, CO2, GLUCOSE, BUN, CREATININE, CALCIUM in the last 72 hours. PT/INR No results for input(s): LABPROT, INR in the last 72 hours. CMP     Component Value Date/Time   NA 142 11/03/2020 1407   K 4.1 11/03/2020 1407   CL 107 11/03/2020 1407   CO2 26 11/03/2020 1407   GLUCOSE 95 11/03/2020 1407   BUN 13 11/03/2020 1407   CREATININE 0.84 11/03/2020 1407   CALCIUM 8.9 11/03/2020 1407   PROT 6.3 (L) 11/03/2020 1407   ALBUMIN 3.6 11/03/2020 1407   AST 15 11/03/2020 1407   ALT 18 11/03/2020 1407   ALKPHOS 70 11/03/2020 1407   BILITOT <0.2 (L) 11/03/2020 1407   GFRNONAA >60 11/03/2020 1407   GFRAA >60 06/06/2019 1205   Lipase     Component Value Date/Time   LIPASE 29 06/06/2019 1205       Studies/Results: NM Sentinel Node Inj-No Rpt (Breast)  Result Date:  11/07/2020 Sulfur Colloid was injected by the Nuclear Medicine Technologist for sentinel lymph node localization.    Anti-infectives: Anti-infectives (From admission, onward)   Start     Dose/Rate Route Frequency Ordered Stop   11/07/20 0843  ANCEF 1 gram in 0.9% normal saline 1000 mL  Status:  Discontinued          As needed 11/07/20 0843 11/07/20 1158   11/07/20 0717  ceFAZolin (ANCEF) 3-0.9 GM/100ML-% IVPB       [place] [facility] Pharmacy: [facility]   : cabinet override      11/07/20 0717 11/07/20 0820   11/07/20 0600  ceFAZolin (ANCEF) IVPB 3g/100 mL premix        3 g 200 mL/hr over 30 Minutes Intravenous On call to O.R. 11/04/20 0700 11/07/20 0741   11/07/20 0000  sulfamethoxazole-trimethoprim (BACTRIM DS) 800-160 MG tablet        1 tablet Oral 2 times daily 11/07/20 1819         Assessment/Plan  BRCA I mutation, left breast cancer s/p primary chemotherapy -S/p Right risk reducing mastectomy, Left mastectomy, Left axillary sentinel node biopsy,, injection of blue dye for sentinel node identification, Port removal 5/16 Dr. Donne Hazel -S/p Bilateral breast reconstruction with tissue expander and acellular dermal matrix, indocyanine green angiography of bilateral mastectomy flaps 5/16 Dr. [name] - POD#1 - Patient stable for discharge from gen surg standpoint. She will go  home with JP drains. Follow up with Dr. Donne Hazel as scheduled.    LOS: 0 days    Sheryl Mcmillan, Sheryl Mcmillan Surgery 11/08/2020, 10:23 AM Please see Amion for pager number during day hours 7:00am-4:30pm

## 2020-11-08 NOTE — Progress Notes (Signed)
Educated pt and daughter about JP drains, recording drainage and JP holders.  Gave pt breast Cancer bag and explained. Daughter demonstrated correct technique of emptying JP drains.

## 2020-11-10 ENCOUNTER — Encounter: Payer: Self-pay | Admitting: *Deleted

## 2020-11-10 ENCOUNTER — Other Ambulatory Visit: Payer: Medicaid Other

## 2020-11-10 ENCOUNTER — Ambulatory Visit: Payer: Medicaid Other

## 2020-11-10 LAB — SURGICAL PATHOLOGY

## 2020-11-14 ENCOUNTER — Telehealth: Payer: Self-pay

## 2020-11-14 NOTE — Telephone Encounter (Addendum)
Returned patients call. LMVM, Dr. [name] will reassess at her next follow appointment 11/16/2020 at 10:15 for PO care and pain management. If she is able to take IBU and Tylenol, to alternately 4 hours.

## 2020-11-14 NOTE — Telephone Encounter (Signed)
Patient called to say that she had surgery with Dr. [name] last Monday and she has one pain pill left.  Patient would like for [place] to refill her pain medicine.  Please call.  **Patient's preferred pharmacy is the [facility]

## 2020-11-16 ENCOUNTER — Ambulatory Visit (INDEPENDENT_AMBULATORY_CARE_PROVIDER_SITE_OTHER): Payer: Medicaid Other | Admitting: Plastic Surgery

## 2020-11-16 ENCOUNTER — Encounter: Payer: Self-pay | Admitting: Oncology

## 2020-11-16 ENCOUNTER — Other Ambulatory Visit: Payer: Self-pay

## 2020-11-16 ENCOUNTER — Other Ambulatory Visit (HOSPITAL_COMMUNITY): Payer: Self-pay

## 2020-11-16 DIAGNOSIS — Z170 Estrogen receptor positive status [ER+]: Secondary | ICD-10-CM

## 2020-11-16 DIAGNOSIS — C50412 Malignant neoplasm of upper-outer quadrant of left female breast: Secondary | ICD-10-CM

## 2020-11-16 MED ORDER — HYDROCODONE-ACETAMINOPHEN 5-325 MG PO TABS
1.0000 | ORAL_TABLET | Freq: Four times a day (QID) | ORAL | 0 refills | Status: DC | PRN
  Filled 2020-11-16: qty 30, 8d supply, fill #0

## 2020-11-16 NOTE — Progress Notes (Signed)
Patient presents 1 week postop from bilateral breast reconstruction with tissue expanders.  She has quite a bit of pain and tightness otherwise seems to be coming along okay.  On exam most of her skin looks to be doing well with the exception of a small area in the inferomedial quadrant on the left side looking a little bit bruised.  She does not appear to have any obvious swelling and the drain output is decreasing on each side to just a little over 30 cc to 40 cc/day of serosanguineous fluid.  There is no tension on the skin whatsoever.  We will plan to give her another week with the drains.  I will plan to monitor that area of skin concern on the left side for now.  I will also refill her pain medication as she still having quite a bit of pain.  Hopefully we can get the drains out next week.  All of her questions were answered.

## 2020-11-16 NOTE — Addendum Note (Signed)
Addended by: [name] on: 11/16/2020 12:19 PM   Modules accepted: Orders

## 2020-11-22 ENCOUNTER — Other Ambulatory Visit: Payer: Self-pay | Admitting: Oncology

## 2020-11-22 ENCOUNTER — Telehealth: Payer: Self-pay | Admitting: Plastic Surgery

## 2020-11-22 NOTE — Telephone Encounter (Signed)
Returned patients call. The bulb for the right drain is not staying compressed, pops out within an hour after emptying. The output of right yesterday was 16 MLs and the left  22 MLs. She is properly running her fingers down the tube to clear what is in the tube, opens cap, empties, measures and recap the top. Advised patient we will be able to observe better when she comes in for her PO appointment on 11/24/2020. Patient understood and agreed with plan.

## 2020-11-22 NOTE — Telephone Encounter (Signed)
Patient called to advise that one of the bulbs on the drain tube will not stay closed. It keeps bubbling up. Please call to advise how to address this.

## 2020-11-23 ENCOUNTER — Other Ambulatory Visit: Payer: Self-pay | Admitting: Oncology

## 2020-11-23 DIAGNOSIS — Z1509 Genetic susceptibility to other malignant neoplasm: Secondary | ICD-10-CM | POA: Insufficient documentation

## 2020-11-23 DIAGNOSIS — Z1502 Genetic susceptibility to malignant neoplasm of ovary: Secondary | ICD-10-CM | POA: Insufficient documentation

## 2020-11-23 DIAGNOSIS — Z148 Genetic carrier of other disease: Secondary | ICD-10-CM | POA: Insufficient documentation

## 2020-11-23 DIAGNOSIS — C50412 Malignant neoplasm of upper-outer quadrant of left female breast: Secondary | ICD-10-CM | POA: Insufficient documentation

## 2020-11-23 DIAGNOSIS — Z170 Estrogen receptor positive status [ER+]: Secondary | ICD-10-CM

## 2020-11-23 DIAGNOSIS — Z6841 Body Mass Index (BMI) 40.0 and over, adult: Secondary | ICD-10-CM | POA: Insufficient documentation

## 2020-11-23 DIAGNOSIS — E282 Polycystic ovarian syndrome: Secondary | ICD-10-CM | POA: Insufficient documentation

## 2020-11-23 DIAGNOSIS — Z5112 Encounter for antineoplastic immunotherapy: Secondary | ICD-10-CM | POA: Insufficient documentation

## 2020-11-23 DIAGNOSIS — Z1501 Genetic susceptibility to malignant neoplasm of breast: Secondary | ICD-10-CM | POA: Insufficient documentation

## 2020-11-24 ENCOUNTER — Other Ambulatory Visit: Payer: Self-pay

## 2020-11-24 ENCOUNTER — Ambulatory Visit (INDEPENDENT_AMBULATORY_CARE_PROVIDER_SITE_OTHER): Payer: Medicaid Other

## 2020-11-24 ENCOUNTER — Inpatient Hospital Stay: Payer: Medicaid Other

## 2020-11-24 VITALS — BP 134/89 | HR 88 | Temp 98.2°F | Resp 16 | Wt 316.0 lb

## 2020-11-24 VITALS — BP 149/90 | HR 78 | Temp 98.3°F | Ht 71.0 in | Wt 310.0 lb

## 2020-11-24 DIAGNOSIS — Z170 Estrogen receptor positive status [ER+]: Secondary | ICD-10-CM

## 2020-11-24 DIAGNOSIS — M79621 Pain in right upper arm: Secondary | ICD-10-CM | POA: Diagnosis present

## 2020-11-24 DIAGNOSIS — R293 Abnormal posture: Secondary | ICD-10-CM | POA: Diagnosis present

## 2020-11-24 DIAGNOSIS — M25612 Stiffness of left shoulder, not elsewhere classified: Secondary | ICD-10-CM | POA: Diagnosis present

## 2020-11-24 DIAGNOSIS — M79622 Pain in left upper arm: Secondary | ICD-10-CM | POA: Diagnosis present

## 2020-11-24 DIAGNOSIS — C50412 Malignant neoplasm of upper-outer quadrant of left female breast: Secondary | ICD-10-CM | POA: Diagnosis not present

## 2020-11-24 DIAGNOSIS — Z1509 Genetic susceptibility to other malignant neoplasm: Secondary | ICD-10-CM

## 2020-11-24 DIAGNOSIS — Z483 Aftercare following surgery for neoplasm: Secondary | ICD-10-CM | POA: Insufficient documentation

## 2020-11-24 DIAGNOSIS — M25611 Stiffness of right shoulder, not elsewhere classified: Secondary | ICD-10-CM | POA: Insufficient documentation

## 2020-11-24 LAB — CMP (CANCER CENTER ONLY)
ALT: 17 U/L (ref 0–44)
AST: 14 U/L — ABNORMAL LOW (ref 15–41)
Albumin: 3.6 g/dL (ref 3.5–5.0)
Alkaline Phosphatase: 75 U/L (ref 38–126)
Anion gap: 11 (ref 5–15)
BUN: 12 mg/dL (ref 6–20)
CO2: 22 mmol/L (ref 22–32)
Calcium: 9.6 mg/dL (ref 8.9–10.3)
Chloride: 107 mmol/L (ref 98–111)
Creatinine: 0.73 mg/dL (ref 0.44–1.00)
GFR, Estimated: >60 mL/min (ref >60)
Glucose, Bld: 88 mg/dL (ref 70–99)
Potassium: 4.1 mmol/L (ref 3.5–5.1)
Sodium: 140 mmol/L (ref 135–145)
Total Bilirubin: <0.2 mg/dL — ABNORMAL LOW (ref 0.3–1.2)
Total Protein: 6.9 g/dL (ref 6.5–8.1)

## 2020-11-24 LAB — CBC WITH DIFFERENTIAL (CANCER CENTER ONLY)
Abs Immature Granulocytes: 0.01 10*3/uL (ref 0.00–0.07)
Basophils Absolute: 0 10*3/uL (ref 0.0–0.1)
Basophils Relative: 1 %
Eosinophils Absolute: 0.2 10*3/uL (ref 0.0–0.5)
Eosinophils Relative: 4 %
HCT: 35 % — ABNORMAL LOW (ref 36.0–46.0)
Hemoglobin: 11 g/dL — ABNORMAL LOW (ref 12.0–15.0)
Immature Granulocytes: 0 %
Lymphocytes Relative: 41 %
Lymphs Abs: 2.7 10*3/uL (ref 0.7–4.0)
MCH: 29.1 pg (ref 26.0–34.0)
MCHC: 31.4 g/dL (ref 30.0–36.0)
MCV: 92.6 fL (ref 80.0–100.0)
Monocytes Absolute: 0.4 10*3/uL (ref 0.1–1.0)
Monocytes Relative: 6 %
Neutro Abs: 3.3 10*3/uL (ref 1.7–7.7)
Neutrophils Relative %: 48 %
Platelet Count: 380 10*3/uL (ref 150–400)
RBC: 3.78 MIL/uL — ABNORMAL LOW (ref 3.87–5.11)
RDW: 14.4 % (ref 11.5–15.5)
WBC Count: 6.6 10*3/uL (ref 4.0–10.5)
nRBC: 0 % (ref 0.0–0.2)

## 2020-11-24 MED ORDER — ACETAMINOPHEN 325 MG PO TABS
650.0000 mg | ORAL_TABLET | Freq: Once | ORAL | Status: AC
  Administered 2020-11-24: 650 mg via ORAL

## 2020-11-24 MED ORDER — ACETAMINOPHEN 325 MG PO TABS
ORAL_TABLET | ORAL | Status: AC
  Filled 2020-11-24: qty 2

## 2020-11-24 MED ORDER — DIPHENHYDRAMINE HCL 25 MG PO CAPS
25.0000 mg | ORAL_CAPSULE | Freq: Once | ORAL | Status: AC
  Administered 2020-11-24: 25 mg via ORAL

## 2020-11-24 MED ORDER — DIPHENHYDRAMINE HCL 25 MG PO CAPS
ORAL_CAPSULE | ORAL | Status: AC
  Filled 2020-11-24: qty 1

## 2020-11-24 MED ORDER — TRASTUZUMAB-HYALURONIDASE-OYSK 600-10000 MG-UNT/5ML ~~LOC~~ SOLN
600.0000 mg | Freq: Once | SUBCUTANEOUS | Status: AC
  Administered 2020-11-24: 600 mg via SUBCUTANEOUS
  Filled 2020-11-24: qty 5

## 2020-11-24 NOTE — Patient Instructions (Signed)
[facility]    Discharge Instructions: Thank you for choosing [facility] to provide your oncology and hematology care.   If you have a lab appointment with the [facility], please go directly to the [facility] and check in at the registration area.   Wear comfortable clothing and clothing appropriate for easy access to any Portacath or PICC line.   We strive to give you quality time with your provider. You may need to reschedule your appointment if you arrive late (15 or more minutes).  Arriving late affects you and other patients whose appointments are after yours.  Also, if you miss three or more appointments without notifying the office, you may be dismissed from the clinic at the provider's discretion.      For prescription refill requests, have your pharmacy contact our office and allow 72 hours for refills to be completed.    Today you received the following chemotherapy and/or immunotherapy agents: trastuzumab-hyaluronidase.    To help prevent nausea and vomiting after your treatment, we encourage you to take your nausea medication as directed.  BELOW ARE SYMPTOMS THAT SHOULD BE REPORTED IMMEDIATELY: . *FEVER GREATER THAN 100.4 F (38 C) OR HIGHER . *CHILLS OR SWEATING . *NAUSEA AND VOMITING THAT IS NOT CONTROLLED WITH YOUR NAUSEA MEDICATION . *UNUSUAL SHORTNESS OF BREATH . *UNUSUAL BRUISING OR BLEEDING . *URINARY PROBLEMS (pain or burning when urinating, or frequent urination) . *BOWEL PROBLEMS (unusual diarrhea, constipation, pain near the anus) . TENDERNESS IN MOUTH AND THROAT WITH OR WITHOUT PRESENCE OF ULCERS (sore throat, sores in mouth, or a toothache) . UNUSUAL RASH, SWELLING OR PAIN  . UNUSUAL VAGINAL DISCHARGE OR ITCHING   Items with * indicate a potential emergency and should be followed up as soon as possible or go to the Emergency Department if any problems should occur.  Please show the CHEMOTHERAPY ALERT CARD or  IMMUNOTHERAPY ALERT CARD at check-in to the Emergency Department and triage nurse.  Should you have questions after your visit or need to cancel or reschedule your appointment, please contact [facility]  Dept: [phone]  and follow the prompts.  Office hours are 8:00 a.m. to 4:30 p.m. Monday - Friday. Please note that voicemails left after 4:00 p.m. may not be returned until the following business day.  We are closed weekends and major holidays. You have access to a nurse at all times for urgent questions. Please call the main number to the clinic Dept: [phone] and follow the prompts.   For any non-urgent questions, you may also contact your provider using MyChart. We now offer e-Visits for anyone 26 and older to request care online for non-urgent symptoms. For details visit [email or web address].   Also download the MyChart app! Go to the app store, search "MyChart", open the app, select [facility], and log in with your MyChart username and password.  Due to Covid, a mask is required upon entering the hospital/clinic. If you do not have a mask, one will be given to you upon arrival. For doctor visits, patients may have 1 support person aged 54 or older with them. For treatment visits, patients cannot have anyone with them due to current Covid guidelines and our immunocompromised population.

## 2020-11-30 NOTE — Progress Notes (Signed)
11/24/20 Pt in office for post-op visit with me for evaluation of incisions & drainage. Left drain= 15 ml  / right= 65ml /day Removed the drains  & applied vaseline/gauze & tape to the sites. She is aware that these areas will continue to drain for next 2-3 days until healed. She reports that the surgical pain has decreased- still is 6/10 on pain scale. Incisions are intact & healing well- no dehiscence or other wound complications. Skin is pink & has normal capillary refill. Denies any chills/fever or other signs of infection. She is eating/drinking & has normal bowel/bladder function She still has swelling - but no noted asymmetry. She continues to use compression garment/ace wraps. She has a f/u with Dr. [name] in 1-2 weeks & she understands to call for any changes or concerns.

## 2020-11-30 NOTE — Patient Instructions (Signed)
Patient will continue compression garmentr. Call for any concerns

## 2020-12-01 ENCOUNTER — Ambulatory Visit: Payer: Medicaid Other

## 2020-12-01 ENCOUNTER — Other Ambulatory Visit: Payer: Medicaid Other

## 2020-12-05 ENCOUNTER — Other Ambulatory Visit: Payer: Self-pay

## 2020-12-05 ENCOUNTER — Ambulatory Visit: Payer: Medicaid Other | Attending: Oncology | Admitting: Physical Therapy

## 2020-12-05 DIAGNOSIS — M25611 Stiffness of right shoulder, not elsewhere classified: Secondary | ICD-10-CM

## 2020-12-05 DIAGNOSIS — M79622 Pain in left upper arm: Secondary | ICD-10-CM

## 2020-12-05 DIAGNOSIS — Z483 Aftercare following surgery for neoplasm: Secondary | ICD-10-CM

## 2020-12-05 DIAGNOSIS — M79621 Pain in right upper arm: Secondary | ICD-10-CM

## 2020-12-05 DIAGNOSIS — C50412 Malignant neoplasm of upper-outer quadrant of left female breast: Secondary | ICD-10-CM

## 2020-12-05 DIAGNOSIS — M25612 Stiffness of left shoulder, not elsewhere classified: Secondary | ICD-10-CM

## 2020-12-05 DIAGNOSIS — R293 Abnormal posture: Secondary | ICD-10-CM

## 2020-12-05 NOTE — Therapy (Signed)
Sheryl Mcmillan, [place], [address] Phone: [phone]   Fax:  [phone]  Physical Therapy Treatment  Patient Details  Name: Sheryl Mcmillan MRN: [id number] Date of Birth: 02/02/1976 Referring Provider (PT): Dr. [name]   Encounter Date: 12/05/2020   PT End of Session - 12/05/20 1348     Visit Number 2    Number of Visits 10    Date for PT Re-Evaluation 01/02/21    Authorization Type [employer] - automatically authorized for 12 visits; will need to seek approval if you need more than 12.    Authorization - Number of Visits 12    PT Start Time 1006    PT Stop Time 1058    PT Time Calculation (min) 52 min    Activity Tolerance Patient tolerated treatment well    Behavior During Therapy WFL for tasks assessed/performed             Past Medical History:  Diagnosis Date   Arthritis    Breast cancer ([place])    Family history of non-Hodgkin's lymphoma 06/01/2020   Hemorrhoids    Migraines    Obesity    PCOS (polycystic ovarian syndrome)     Past Surgical History:  Procedure Laterality Date   AXILLARY SENTINEL NODE BIOPSY Left 11/07/2020   Procedure: LEFT AXILLARY SENTINEL NODE BIOPSY;  Surgeon: [name], MD;  Location: [facility];  Service: General;  Laterality: Left;   BREAST RECONSTRUCTION WITH PLACEMENT OF TISSUE EXPANDER AND FLEX HD (ACELLULAR HYDRATED DERMIS) Bilateral 11/07/2020   Procedure: BILATERAL BREAST RECONSTRUCTION WITH PLACEMENT OF TISSUE EXPANDER AND FLEX HD (ACELLULAR HYDRATED DERMIS);  Surgeon: [name], MD;  Location: [facility];  Service: Plastics;  Laterality: Bilateral;   COSMETIC SURGERY     DILATION AND CURETTAGE OF UTERUS     HEMORRHOID SURGERY     IR IMAGING GUIDED PORT INSERTION  06/15/2020   PORT-A-CATH REMOVAL Right 11/07/2020   Procedure: REMOVAL PORT-A-CATH;  Surgeon: [name], MD;  Location: [facility];  Service: General;  Laterality:  Right;   TOTAL MASTECTOMY Bilateral 11/07/2020   Procedure: BILATERAL MASTECTOMY;  Surgeon: [name], MD;  Location: [facility];  Service: General;  Laterality: Bilateral;    There were no vitals filed for this visit.   Subjective Assessment - 12/05/20 1008     Subjective Patient underwent a bilateral mastectomy and left sentinel node biopsy (1 negative node) on 11/07/2020. Expanders placed at the time of surgery and will have her first expander fill on 12/08/2020. She underwent neoadjuvant chemotherapy 09/29/2020. She is going to have a hysterectomy due to being genetically positive.    Pertinent History Patient was diagnosed on 05/10/2020 with left grade III invasive ductal carcinoma breast cancer. She had a bilateral mastectomy and left sentinel node biopsy (1 negative node) on 11/07/2020 It is ER positive, PR negative, and HER2 positive with a Ki67 of 50%. She underwent plastic surgery to her left arm at age 42 when her arm was severely cut in a glass door.    Patient Stated Goals Get arm moving normally and pain under control. Unable to wipe bottom.    Currently in Pain? Yes    Pain Score 5     Pain Location Scapula    Pain Orientation Left    Pain Descriptors / Indicators Stabbing    Pain Type Surgical pain    Pain Onset 1 to 4 weeks ago    Pain Frequency Intermittent  Aggravating Factors  Nothing known    Pain Relieving Factors Nothink known; random                [facility] PT Assessment - 12/05/20 0001       Assessment   Medical Diagnosis s/p bil mastectomy and left SNLB with reconstruction    Referring Provider (PT) Dr. [name]    Onset Date/Surgical Date 11/07/20    Hand Dominance Right    Prior Therapy Baselines      Precautions   Precautions Other (comment)    Precaution Comments recent surgery      Restrictions   Weight Bearing Restrictions No      Balance Screen   Has the patient fallen in the past 6 months No    Has the patient had a decrease in  activity level because of a fear of falling?  No    Is the patient reluctant to leave their home because of a fear of falling?  No      Home Environment   Living Environment Private residence    Living Arrangements Other relatives   Boyfriend, 23 mth old granddaughter, 35 y.o. daughter   Available Help at Discharge Family      Prior Function   Level of Independence Independent    Vocation Full time employment    Vocation Requirements Not working now    Leisure She is not exercising      Cognition   Overall Cognitive Status Within Functional Limits for tasks assessed      Observation/Other Assessments   Observations Bilateral chest incisions appear to be healing normally. No significant edema or redness present.      Posture/Postural Control   Posture/Postural Control Postural limitations    Postural Limitations Rounded Shoulders;Forward head      ROM / Strength   AROM / PROM / Strength AROM      AROM   AROM Assessment Site Shoulder    Right/Left Shoulder Right;Left    Right Shoulder Extension 51 Degrees    Right Shoulder Flexion 109 Degrees    Right Shoulder ABduction 89 Degrees    Right Shoulder Internal Rotation 71 Degrees    Right Shoulder External Rotation 89 Degrees    Left Shoulder Extension 49 Degrees    Left Shoulder Flexion 93 Degrees    Left Shoulder ABduction 94 Degrees    Left Shoulder Internal Rotation 69 Degrees    Left Shoulder External Rotation 86 Degrees      Strength   Overall Strength Unable to assess;Due to precautions      Palpation   Palpation comment Tender/tight with palpation bilateral rhomboid regions               LYMPHEDEMA/ONCOLOGY QUESTIONNAIRE - 12/05/20 0001       Type   Cancer Type Let breast cancer      Surgeries   Mastectomy Date 11/07/20    Sentinel Lymph Node Biopsy Date 11/07/20    Number Lymph Nodes Removed 1      Treatment   Active Chemotherapy Treatment No    Past Chemotherapy Treatment Yes    Date 09/29/20     Active Radiation Treatment No    Past Radiation Treatment No    Current Hormone Treatment No    Past Hormone Therapy No      What other symptoms do you have   Are you Having Heaviness or Tightness Yes    Are you having Pain Yes  Are you having pitting edema No    Is it Hard or Difficult finding clothes that fit No    Do you have infections No    Is there Decreased scar mobility Yes    Stemmer Sign No      Lymphedema Assessments   Lymphedema Assessments Upper extremities      Right Upper Extremity Lymphedema   10 cm Proximal to Olecranon Process 43.5 cm    Olecranon Process 31.6 cm    10 cm Proximal to Ulnar Styloid Process 27.1 cm    Just Proximal to Ulnar Styloid Process 19.3 cm    Across Hand at [employer] 21.7 cm    At [place] of 2nd Digit 7.2 cm      Left Upper Extremity Lymphedema   10 cm Proximal to Olecranon Process 43.6 cm    Olecranon Process 31.2 cm    10 cm Proximal to Ulnar Styloid Process 26.3 cm    Just Proximal to Ulnar Styloid Process 19.2 cm    Across Hand at [employer] 21.3 cm    At [place] of 2nd Digit 6.8 cm                Quick Dash - 12/05/20 0001     Open a tight or new jar Unable    Do heavy household chores (wash walls, wash floors) Unable    Carry a shopping bag or briefcase Mild difficulty    Wash your back Unable    Use a knife to cut food No difficulty    Recreational activities in which you take some force or impact through your arm, shoulder, or hand (golf, hammering, tennis) Unable    During the past week, to what extent has your arm, shoulder or hand problem interfered with your normal social activities with family, friends, neighbors, or groups? Not at all    During the past week, to what extent has your arm, shoulder or hand problem limited your work or other regular daily activities Quite a bit    Arm, shoulder, or hand pain. Extreme    Tingling (pins and needles) in your arm, shoulder, or hand None    Difficulty  Sleeping No difficulty    DASH Score 54.55 %                            PT Education - 12/05/20 1336     Education Details Aftercare; scar massage; lymphedema education; HEP    Person(s) Educated Patient;Child(ren)    Methods Explanation;Demonstration;Handout    Comprehension Returned demonstration;Verbalized understanding                 PT Long Term Goals - 12/05/20 1404       PT LONG TERM GOAL #1   Title Patient will demonstrate she has regained full shoulder ROM and function post opeeratively compared to baselines.    Time 4    Period Weeks    Status On-going    Target Date 01/02/21      PT LONG TERM GOAL #2   Title Patient will increase bilateral shoulder active flexion to >/= 150 degrees for increased ease reaching overhead.    Time 4    Period Weeks    Status New    Target Date 01/02/21      PT LONG TERM GOAL #3   Title Patient will increase bilateral shoulder active abduction to >/= 140 degrees for increased  ease reaching overhead.    Time 4    Period Weeks    Status New    Target Date 01/02/21      PT LONG TERM GOAL #4   Title Patient will decrease her DASH score to </= 38.64 to achieve baseline arm function.    Baseline 54.55    Time 4    Period Weeks    Status New    Target Date 01/02/21      PT LONG TERM GOAL #5   Title Patient will verbalize good understanding of lympehdema risk reduction practices.    Baseline No knowledge    Time 4    Period Weeks    Status New    Target Date 01/02/21      Additional Long Term Goals   Additional Long Term Goals Yes      PT LONG TERM GOAL #6   Title Patient will report she has decreased pain by >/= 50% with daily tasks to reduce need for medication and tolerate normal ADLs.    Baseline Pain currently varies from 5-10/10 with most daily tasks.    Time 4    Period Weeks    Status New    Target Date 01/02/21                   Plan - 12/05/20 1349     Clinical Impression  Statement Patient is having a hard time recovering from her bilateral mastectomy and left sentinel node biopsy with bilateral breast reconstruction, which was done on 11/07/2020. Her pain is fairly high and she is having difficulty using either arm functionally. She is scheduled for her first expander fill on 12/07/2020. Her incisions are healing well. She will benefit from PT to regain shoulder ROM and function.    PT Frequency 2x / week    PT Duration 4 weeks    PT Treatment/Interventions ADLs/Self Care Home Management;Therapeutic exercise;Patient/family education;Manual techniques;Passive range of motion;Scar mobilization    PT Next Visit Plan Begin bilateral shoulder PROM and AAROM exercises    PT Home Exercise Plan Post op shoulder ROM HEP    Consulted and Agree with Plan of Care Patient;Family member/caregiver    Family Member Consulted Daughter             Patient will benefit from skilled therapeutic intervention in order to improve the following deficits and impairments:  Postural dysfunction, Decreased range of motion, Decreased knowledge of precautions, Impaired UE functional use, Pain, Decreased scar mobility  Visit Diagnosis: Malignant neoplasm of upper-outer quadrant of left breast in female, estrogen receptor positive ([place]) - Plan: PT plan of care cert/re-cert  Abnormal posture - Plan: PT plan of care cert/re-cert  Stiffness of left shoulder, not elsewhere classified - Plan: PT plan of care cert/re-cert  Stiffness of right shoulder, not elsewhere classified - Plan: PT plan of care cert/re-cert  Pain in left upper arm - Plan: PT plan of care cert/re-cert  Pain in right upper arm - Plan: PT plan of care cert/re-cert  Aftercare following surgery for neoplasm - Plan: PT plan of care cert/re-cert     Problem List Patient Active Problem List   Diagnosis Date Noted   Breast cancer ([place]) 11/07/2020   Peripheral neuropathy due to chemotherapy ([place]) 10/20/2020   Morbid  obesity with BMI of 40.0-44.9, adult ([place]) 07/19/2020   Port-A-Cath in place 07/07/2020   BRCA1 gene mutation positive 07/04/2020   Genetic testing 06/28/2020   Family history of non-Hodgkin's lymphoma  06/01/2020   Malignant neoplasm of upper-outer quadrant of left breast in female, estrogen receptor positive ([facility]) 05/26/2020   Annia Friendly, PT 12/05/20 2:14 PM   [facility] [place], [place], [address] Phone: [phone]   Fax:  [phone]  Name: CANDIES PALM MRN: [id number] Date of Birth: 1976/04/11

## 2020-12-05 NOTE — Patient Instructions (Signed)
            [facility]         [address],  [address]         [phone]         Annia Friendly, PT, CLT   After Breast Cancer Class It is recommended you attend the ABC class to be educated on lymphedema risk reduction. This class is free of charge and lasts for 1 hour. It is a 1-time class.  You are scheduled for June 20th at 11:00. We will send you a link for the Webex class the night before or the morning of.  Scar massage You can begin scar massage to both chest incisions once the steri-strips fall off. We recommend coconut oil.   Home exercise Program Keep doing the exercises I gave you initially. Add the others (see below)   Follow up PT: It is recommended you return every 3 months for the first 3 years following surgery to be assessed on the SOZO machine for an L-Dex score. This helps prevent clinically significant lymphedema in 95% of patients. These follow up screens are 15 minute appointments that you are not billed for. WE ARE SCHEDULED TO MOVE TO A NEW CLINIC BUT THE DATE IS NOT YET DETERMINED. APPOINTMENTS FOR THESE SOZO SCREENS MAY BE LOCATED AT [facility] AT [address] [place] [address]. Please call us to determine where you should come for this appointment. The phone number is [phone]. You are scheduled for August 22nd at 9:40.  Shoulder Internal Rotation    Standing, feet shoulder width apart, grasp club with one hand palm forward, arm extended above head, and other hand palm back behind back, arm bent elbow down. Pull gently upward. Hold __5__ seconds. Switch arms and repeat. Repeat __5__ times. Do __2__ sessions per day on each side.  Copyright  VHI. All rights reserved.  Horizontal Adduction    Stretch shoulder inward: 1. Pull arm with other hand until elbow comes to midline. 2. Extend arms in front at shoulder height, crossed at elbow, then clasp hands. Hold __5__ seconds.  Repeat, switching arms. Repeat __5__ times. Do __2__ sessions per day.   Copyright  VHI. All rights reserved.   Also - in sitting, reach across to the outside of your opposite knee. Grab on and gently pull up towards you. Hold 5 seconds. Repeat 5 times on each side twice a day.

## 2020-12-07 ENCOUNTER — Other Ambulatory Visit: Payer: Self-pay

## 2020-12-07 ENCOUNTER — Ambulatory Visit: Payer: Medicaid Other

## 2020-12-07 DIAGNOSIS — R293 Abnormal posture: Secondary | ICD-10-CM

## 2020-12-07 DIAGNOSIS — M79622 Pain in left upper arm: Secondary | ICD-10-CM

## 2020-12-07 DIAGNOSIS — M25611 Stiffness of right shoulder, not elsewhere classified: Secondary | ICD-10-CM

## 2020-12-07 DIAGNOSIS — C50412 Malignant neoplasm of upper-outer quadrant of left female breast: Secondary | ICD-10-CM | POA: Diagnosis not present

## 2020-12-07 DIAGNOSIS — M25612 Stiffness of left shoulder, not elsewhere classified: Secondary | ICD-10-CM

## 2020-12-07 DIAGNOSIS — Z170 Estrogen receptor positive status [ER+]: Secondary | ICD-10-CM

## 2020-12-07 DIAGNOSIS — Z483 Aftercare following surgery for neoplasm: Secondary | ICD-10-CM

## 2020-12-07 DIAGNOSIS — M79621 Pain in right upper arm: Secondary | ICD-10-CM

## 2020-12-07 NOTE — Therapy (Signed)
[place], [place], [address] Phone: [phone]   Fax:  [phone]  Physical Therapy Treatment  Patient Details  Name: Sheryl Mcmillan MRN: [id number] Date of Birth: [birth year] Referring Provider (PT): Dr. [name]   Encounter Date: 12/07/2020   PT End of Session - 12/07/20 1546     Visit Number 3    Number of Visits 10    Authorization Type [employer] Medicaid - automatically authorized for 12 visits; will need to seek approval if you need more than 12.    Authorization - Number of Visits 12    PT Start Time 1503    PT Stop Time 1551    PT Time Calculation (min) 48 min    Activity Tolerance Patient tolerated treatment well    Behavior During Therapy WFL for tasks assessed/performed             Past Medical History:  Diagnosis Date   Arthritis    Breast cancer ([place])    Family history of non-Hodgkin's lymphoma 06/01/2020   Hemorrhoids    Migraines    Obesity    PCOS (polycystic ovarian syndrome)     Past Surgical History:  Procedure Laterality Date   AXILLARY SENTINEL NODE BIOPSY Left 11/07/2020   Procedure: LEFT AXILLARY SENTINEL NODE BIOPSY;  Surgeon: [name], MD;  Location: [facility];  Service: General;  Laterality: Left;   BREAST RECONSTRUCTION WITH PLACEMENT OF TISSUE EXPANDER AND FLEX HD (ACELLULAR HYDRATED DERMIS) Bilateral 11/07/2020   Procedure: BILATERAL BREAST RECONSTRUCTION WITH PLACEMENT OF TISSUE EXPANDER AND FLEX HD (ACELLULAR HYDRATED DERMIS);  Surgeon: [name], MD;  Location: [facility];  Service: Plastics;  Laterality: Bilateral;   COSMETIC SURGERY     DILATION AND CURETTAGE OF UTERUS     HEMORRHOID SURGERY     IR IMAGING GUIDED PORT INSERTION  06/15/2020   PORT-A-CATH REMOVAL Right 11/07/2020   Procedure: REMOVAL PORT-A-CATH;  Surgeon: [name], MD;  Location: [facility];  Service: General;  Laterality: Right;   TOTAL MASTECTOMY Bilateral  11/07/2020   Procedure: BILATERAL MASTECTOMY;  Surgeon: [name], MD;  Location: [facility];  Service: General;  Laterality: Bilateral;    There were no vitals filed for this visit.   Subjective Assessment - 12/07/20 1502     Subjective Pt reports she has been compliant with HEP and is seeing some improvement in ROM.  Still having trouble wiping herself. I have my first fill tomorrow    Pertinent History Patient was diagnosed on 05/10/2020 with left grade III invasive ductal carcinoma breast cancer. She had a bilateral mastectomy and left sentinel node biopsy (1 negative node) on 11/07/2020 It is ER positive, PR negative, and HER2 positive with a Ki67 of 50%. She underwent plastic surgery to her left arm at age 21 when her arm was severely cut in a glass door.    Patient Stated Goals Get arm moving normally and pain under control. Unable to wipe bottom.    Currently in Pain? No/denies    Pain Score 0-No pain                               [facility] Adult PT Treatment/Exercise - 12/07/20 0001       Shoulder Exercises: Supine   Other Supine Exercises supine wand flex, scaption , stargazer x 5 ea   Other Supine Exercises AROM  bilateral flex, scaption, horizontal abd x5  Manual Therapy   Soft tissue mobilization bilateral axillary border of pectorals with coco butter    Passive ROM PROM bilateral shoulder flex, scaption, abd, ER, IR                         PT Long Term Goals - 12/05/20 1404       PT LONG TERM GOAL #1   Title Patient will demonstrate she has regained full shoulder ROM and function post opeeratively compared to baselines.    Time 4    Period Weeks    Status On-going    Target Date 01/02/21      PT LONG TERM GOAL #2   Title Patient will increase bilateral shoulder active flexion to >/= 150 degrees for increased ease reaching overhead.    Time 4    Period Weeks    Status New    Target Date 01/02/21      PT LONG TERM GOAL #3    Title Patient will increase bilateral shoulder active abduction to >/= 140 degrees for increased ease reaching overhead.    Time 4    Period Weeks    Status New    Target Date 01/02/21      PT LONG TERM GOAL #4   Title Patient will decrease her DASH score to </= 38.64 to achieve baseline arm function.    Baseline 54.55    Time 4    Period Weeks    Status New    Target Date 01/02/21      PT LONG TERM GOAL #5   Title Patient will verbalize good understanding of lympehdema risk reduction practices.    Baseline No knowledge    Time 4    Period Weeks    Status New    Target Date 01/02/21      Additional Long Term Goals   Additional Long Term Goals Yes      PT LONG TERM GOAL #6   Title Patient will report she has decreased pain by >/= 50% with daily tasks to reduce need for medication and tolerate normal ADLs.    Baseline Pain currently varies from 5-10/10 with most daily tasks.    Time 4    Period Weeks    Status New    Target Date 01/02/21                   Plan - 12/07/20 1556     Clinical Impression Statement Therapy consisted of soft tissue mobilization to axillary border of pectoralis, AAROM, PROM to bilateral shoulders, and AROM in supine for flex, scaption and horizontal abd.  Pt did exceptionally well and had excellent improvement in ROM at completion of treatment             Patient will benefit from skilled therapeutic intervention in order to improve the following deficits and impairments:  Postural dysfunction, Decreased range of motion, Decreased knowledge of precautions, Impaired UE functional use, Pain, Decreased scar mobility  Visit Diagnosis: Malignant neoplasm of upper-outer quadrant of left breast in female, estrogen receptor positive ([facility])  Abnormal posture  Stiffness of left shoulder, not elsewhere classified  Stiffness of right shoulder, not elsewhere classified  Pain in left upper arm  Pain in right upper arm  Aftercare  following surgery for neoplasm     Problem List Patient Active Problem List   Diagnosis Date Noted   Breast cancer ([place]) 11/07/2020   Peripheral neuropathy due to chemotherapy ([place])  10/20/2020   Morbid obesity with BMI of 40.0-44.9, adult ([place]) 07/19/2020   Port-A-Cath in place 07/07/2020   BRCA1 gene mutation positive 07/04/2020   Genetic testing 06/28/2020   Family history of non-Hodgkin's lymphoma 06/01/2020   Malignant neoplasm of upper-outer quadrant of left breast in female, estrogen receptor positive ([place]) 05/26/2020    Claris Pong 12/07/2020, 3:59 PM  [place], [place], [address] Phone: [phone]   Fax:  [phone]  Name: LAMIYA NAAS MRN: [id number] Date of Birth: 02-May-1976  Cheral Almas, PT 12/07/20 4:00 PM

## 2020-12-08 ENCOUNTER — Other Ambulatory Visit: Payer: Self-pay

## 2020-12-08 ENCOUNTER — Ambulatory Visit (INDEPENDENT_AMBULATORY_CARE_PROVIDER_SITE_OTHER): Payer: Medicaid Other | Admitting: Plastic Surgery

## 2020-12-08 DIAGNOSIS — C50412 Malignant neoplasm of upper-outer quadrant of left female breast: Secondary | ICD-10-CM

## 2020-12-08 DIAGNOSIS — Z170 Estrogen receptor positive status [ER+]: Secondary | ICD-10-CM

## 2020-12-08 NOTE — Progress Notes (Signed)
Patient presents postop from bilateral breast reduction.  She overall feels like things are going well.  On exam her skin looks to be doing well overall.  There is a small area of breakdown right at the T-junction on the left side.  A very small 1 cm area of fibrinous tissue was debrided sharply today in the office.  I did overlap the T-junction so there should be healthy dermis from the inferior mastectomy flap beneath that location.  I Sheryl Mcmillan plan on holding off on starting the filling process until this heals a bit more reliably.  I will check it again in 2 weeks.  Otherwise she is doing well and she is in agreement with this plan.  All of her questions were answered.

## 2020-12-09 ENCOUNTER — Encounter: Payer: Self-pay | Admitting: Gynecologic Oncology

## 2020-12-09 ENCOUNTER — Inpatient Hospital Stay (HOSPITAL_BASED_OUTPATIENT_CLINIC_OR_DEPARTMENT_OTHER): Payer: Medicaid Other | Admitting: Gynecologic Oncology

## 2020-12-09 VITALS — BP 142/95 | HR 88 | Temp 97.7°F | Resp 18 | Ht 71.0 in | Wt 321.6 lb

## 2020-12-09 DIAGNOSIS — Z1509 Genetic susceptibility to other malignant neoplasm: Secondary | ICD-10-CM | POA: Diagnosis not present

## 2020-12-09 DIAGNOSIS — Z1501 Genetic susceptibility to malignant neoplasm of breast: Secondary | ICD-10-CM | POA: Diagnosis not present

## 2020-12-09 DIAGNOSIS — Z148 Genetic carrier of other disease: Secondary | ICD-10-CM | POA: Diagnosis not present

## 2020-12-09 DIAGNOSIS — Z1502 Genetic susceptibility to malignant neoplasm of ovary: Secondary | ICD-10-CM

## 2020-12-09 DIAGNOSIS — C50412 Malignant neoplasm of upper-outer quadrant of left female breast: Secondary | ICD-10-CM | POA: Diagnosis not present

## 2020-12-09 MED ORDER — OXYCODONE HCL 5 MG PO TABS: 5.0000 mg | ORAL_TABLET | ORAL | 0 refills | Status: DC | PRN

## 2020-12-09 MED ORDER — SENNOSIDES-DOCUSATE SODIUM 8.6-50 MG PO TABS: 2.0000 | ORAL_TABLET | Freq: Every day | ORAL | 0 refills | Status: DC

## 2020-12-09 NOTE — Progress Notes (Signed)
Follow-up Note: Gyn-Onc  Consult was requested by Sheryl Bihari NP for the evaluation of Sheryl Mcmillan 45 y.o. female  CC:  Chief Complaint  Patient presents with   BRCA1 gene mutation positive    Assessment/Plan:  Sheryl Mcmillan  is a 45 y.o.  year old with a deleterious mutation in [place] and a recent diagnosis of left sided breast cancer (stage II, ER +, HER2+).   The patient is s/p chemotherapy, mastectomy and tissue expanders. She has gall bladder disease and desires cholecystectomy at the same time as her risk reducing surgery. Risk reducing surgery for Sheryl Mcmillan would involve a robotic assisted total hysterectomy with BSO.   I explained that this will do surgical menopause (though menopause may already be in place following chemotherapy administration.  I explained that unfortunately she will not be able to receive hormone replacement therapy to ameliorate her symptoms of menopause due to her HR positive breast cancer.  We will have her seen by the surgeons to evaluate her for the need for a cholecystectomy and the possibility of a joint case.   HPI: Sheryl Mcmillan is a 45 year old P1 who was seen in consultation at the request of Sheryl Bihari NP for evaluation of a deleterious mutation in BRCA1 in the setting of left sided breast cancer diagnosis.  The patient was diagnosed with a left-sided breast cancer (stage II, ER positive, HER-2 positive) on May 22, 2020.  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  Treatment for her breast cancer was neoadjuvant chemotherapy followed by surgery and hormonal modulation with antiestrogen and possibly anti-HER-2 treatment. She completed chemotherapy on 09/29/20 and received a mastectomy and tissue expanders.  Her medical history is most significant for morbid obesity with a BMI of 41kg/m2.  Her gynecologic history is remarkable for a  remote history of an abnormal pap smear. She had never required colposcopy or surgical excision. She reported having had regular gynecologic care. She had one prior SVD. Pap on 07/19/20 was normal and negative for high risk HPV.  TVUS (screening) on 08/12/20 revealed a uterus measuring 7.8x3.3x4.3cm. The endometrium was 28m. The right and left ovaries were normal bilaterally. CA 125 was normal at 10.2 on 07/28/20.  Interval Hx: She has breast tissue expanders in and is awaiting replacement with implants. She has been amenorrheic since completing chemotherapy. She has known gall bladder stones and has had 2 attacks in recent years. She was told that she needed to have her gall bladder removed, but could not do this earlier due to lack of insurance. She has a GB ultrasound scheduled for early July. She desires robotic assisted risk reducing hysterectomy with BSO in early August and desires concurrent cholecystectomy if this can be arranged (and is deemed necessary by the surgeon).  Current Meds:  Outpatient Encounter Medications as of 12/09/2020  Medication Sig   Acetaminophen (TYLENOL ARTHRITIS PAIN PO) Take by mouth. As needed   Multiple Vitamins-Minerals (ONE-A-DAY WOMENS PO) Take 1 tablet by mouth daily.   [DISCONTINUED] aspirin-acetaminophen-caffeine (EXCEDRIN MIGRAINE) 250-250-65 MG tablet Take 1-2 tablets by mouth every 6 (six) hours as needed for headache.   [DISCONTINUED] lidocaine-prilocaine (EMLA) cream APPLY TO AFFECTED AREA ONCE   Facility-Administered Encounter Medications as of 12/09/2020  Medication   sodium chloride flush (NS) 0.9 % injection 10 mL    Allergy:  Allergies  Allergen Reactions   Carboplatin Hives and Itching  Carboplatin reaction after dose #5 requiring Benadryl, Solumedrol, epinephrine, Claritin.   Other Hives    Baking Soda   Wound Dressing Adhesive Hives    Rips skin    Social Hx:   Social History   Socioeconomic History   Marital status: Single     Spouse name: Not on file   Number of children: 1   Years of education: Not on file   Highest education level: Some college, no degree  Occupational History   Not on file  Tobacco Use   Smoking status: Former    Packs/day: 0.50    Years: 10.00    Pack years: 5.00    Types: Cigarettes    Quit date: 08/23/2020    Years since quitting: 0.2   Smokeless tobacco: Never  Vaping Use   Vaping Use: Never used  Substance and Sexual Activity   Alcohol use: Yes    Comment: occasionally   Drug use: Yes    Types: Marijuana    Comment: 5-7 days/week   Sexual activity: Yes    Birth control/protection: None  Other Topics Concern   Not on file  Social History Narrative   Not on file   Social Determinants of Health   Financial Resource Strain: Not on file  Food Insecurity: Not on file  Transportation Needs: No Transportation Needs   Lack of Transportation (Medical): No   Lack of Transportation (Non-Medical): No  Physical Activity: Not on file  Stress: Not on file  Social Connections: Not on file  Intimate Partner Violence: Not on file    Past Surgical Hx:  Past Surgical History:  Procedure Laterality Date   AXILLARY SENTINEL NODE BIOPSY Left 11/07/2020   Procedure: LEFT AXILLARY SENTINEL NODE BIOPSY;  Surgeon: [name], MD;  Location: [place];  Service: General;  Laterality: Left;   BREAST RECONSTRUCTION WITH PLACEMENT OF TISSUE EXPANDER AND FLEX HD (ACELLULAR HYDRATED DERMIS) Bilateral 11/07/2020   Procedure: BILATERAL BREAST RECONSTRUCTION WITH PLACEMENT OF TISSUE EXPANDER AND FLEX HD (ACELLULAR HYDRATED DERMIS);  Surgeon: [name], MD;  Location: [place];  Service: Plastics;  Laterality: Bilateral;   COSMETIC SURGERY     DILATION AND CURETTAGE OF UTERUS     HEMORRHOID SURGERY     IR IMAGING GUIDED PORT INSERTION  06/15/2020   PORT-A-CATH REMOVAL Right 11/07/2020   Procedure: REMOVAL PORT-A-CATH;  Surgeon: [name], MD;  Location: [place];  Service: General;   Laterality: Right;   TOTAL MASTECTOMY Bilateral 11/07/2020   Procedure: BILATERAL MASTECTOMY;  Surgeon: [name], MD;  Location: [place];  Service: General;  Laterality: Bilateral;    Past Medical Hx:  Past Medical History:  Diagnosis Date   Arthritis    Breast cancer ([place])    Family history of non-Hodgkin's lymphoma 06/01/2020   Hemorrhoids    Migraines    Obesity    PCOS (polycystic ovarian syndrome)     Past Gynecological History:  See HPI No LMP recorded. (Menstrual status: Chemotherapy).  Family Hx:  Family History  Problem Relation Age of Onset   Hypertension Mother    Diabetes Mother    Cancer Mother 45       unknown primary   Other Mother        brain tumor; dx 46s   Non-Hodgkin's lymphoma Brother 36   Other Maternal Uncle 41       brain tumor   Breast cancer Other        MGM's niece; dx late 28s  Pancreatic cancer Neg Hx    Colon cancer Neg Hx    Endometrial cancer Neg Hx    Prostate cancer Neg Hx    Ovarian cancer Neg Hx     Review of Systems:  Constitutional  Feels well,   ENT Normal appearing ears and nares bilaterally Skin/Breast  No rash, sores, jaundice, itching, dryness Cardiovascular  No chest pain, shortness of breath, or edema  Pulmonary  No cough or wheeze.  Gastro Intestinal  No nausea, vomitting, or diarrhoea. No bright red blood per rectum, no abdominal pain, change in bowel movement, or constipation.  Genito Urinary  No frequency, urgency, dysuria, irregular menses since starting chemotherapy Musculo Skeletal  No myalgia, arthralgia, joint swelling or pain  Neurologic  No weakness, numbness, change in gait,  Psychology  No depression, anxiety, insomnia.   Vitals:  Blood pressure (!) 142/95, pulse 88, temperature 97.7 F (36.5 C), temperature source Tympanic, resp. rate 18, height 5' 11"  (1.803 m), weight (!) 321 lb 9.6 oz (145.9 kg), SpO2 97 %.  Physical Exam: WD in NAD Neck  Supple NROM, without any enlargements.   Lymph Node Survey No cervical supraclavicular or inguinal adenopathy Cardiovascular  Pulse normal rate, regularity and rhythm. S1 and S2 normal.  Lungs  Clear to auscultation bilateraly, without wheezes/crackles/rhonchi. Good air movement.  Skin  No rash/lesions/breakdown  Psychiatry  Alert and oriented to person, place, and time  Abdomen  Normoactive bowel sounds, abdomen soft, non-tender and obese without evidence of hernia. Pannus present. Back No CVA tenderness Genito Urinary  Vulva/vagina: Normal external female genitalia.  No lesions. No discharge or bleeding.  Bladder/urethra:  No lesions or masses, well supported bladder  Vagina: long, normal, no lesions.  Cervix: Normal appearing, no lesions. Pap taken.  Uterus:  Small, mobile, no parametrial involvement or nodularity.  Adnexa: no palpable masses. Rectal  deferred Extremities  No bilateral cyanosis, clubbing or edema.    Thereasa Solo, MD  12/09/2020, 3:06 PM

## 2020-12-09 NOTE — Patient Instructions (Signed)
Preparing for your Surgery  Plan for surgery with Dr. Emma Rossi and a general surgeon with [facility] for robotic assisted cholecystectomy (gallbladder removal) at [facility]. You will be scheduled with Dr. Rossi for a robotic assisted total laparoscopic hysterectomy (removal of the uterus and cervix), bilateral salpingo-oophorectomy (removal of both ovaries and fallopian tubes).   We will arrange for you to meet with a general surgeon at [facility] to arrange for a joint procedure. After you have met with them in the office, we can arrange the surgical date and notify you with this information.   Pre-operative Testing -You will receive a phone call from presurgical testing at [facility] to arrange for a pre-operative appointment and lab work.  -Bring your insurance card, copy of an advanced directive if applicable, medication list  -At that visit, you will be asked to sign a consent for a possible blood transfusion in case a transfusion becomes necessary during surgery.  The need for a blood transfusion is rare but having consent is a necessary part of your care.     -You should not be taking blood thinners or aspirin at least ten days prior to surgery unless instructed by your surgeon.  -Do not take supplements such as fish oil (omega 3), red yeast rice, turmeric before your surgery. You want to avoid medications with aspirin in them including headache powders such as BC or Goody's), Excedrin migraine.  Day Before Surgery at Home -DIET INSTRUCTIONS TO BE FROM YOUR GENERAL SURGEON  Your role in recovery Your role is to become active as soon as directed by your doctor, while still giving yourself time to heal.  Rest when you feel tired. You will be asked to do the following in order to speed your recovery:  - Cough and breathe deeply. This helps to clear and expand your lungs and can prevent pneumonia after surgery.  - STAY ACTIVE WHEN YOU GET  HOME. Do mild physical activity. Walking or moving your legs help your circulation and body functions return to normal. Do not try to get up or walk alone the first time after surgery.   -If you develop swelling on one leg or the other, pain in the back of your leg, redness/warmth in one of your legs, please call the office or go to the Emergency Room to have a doppler to rule out a blood clot. For shortness of breath, chest pain-seek care in the Emergency Room as soon as possible. - Actively manage your pain. Managing your pain lets you move in comfort. We will ask you to rate your pain on a scale of zero to 10. It is your responsibility to tell your doctor or nurse where and how much you hurt so your pain can be treated.  Special Considerations -If you are diabetic, you may be placed on insulin after surgery to have closer control over your blood sugars to promote healing and recovery.  This does not mean that you will be discharged on insulin.  If applicable, your oral antidiabetics will be resumed when you are tolerating a solid diet.  -Your final pathology results from surgery should be available around one week after surgery and the results will be relayed to you when available.  -Dr. Lisa Jackson-Moore is the surgeon that assists your GYN Oncologist with surgery.  If you end up staying the night, the next day after your surgery you will either see Dr. Rossi, Dr. Tucker, or Dr. Lisa Jackson-Moore.  -  FMLA forms can be faxed to [phone] and please allow 5-7 business days for completion.  Pain Management After Surgery -You have been prescribed your pain medication and bowel regimen medications before surgery so that you can have these available when you are discharged from the hospital. The pain medication is for use ONLY AFTER surgery and a new prescription will not be given.   -Make sure that you have Tylenol and Ibuprofen at home to use on a regular basis after surgery for pain control. We  recommend alternating the medications every hour to six hours since they work differently and are processed in the body differently for pain relief.  -Review the attached handout on narcotic use and their risks and side effects.   Bowel Regimen -You have been prescribed Sennakot-S to take nightly to prevent constipation especially if you are taking the narcotic pain medication intermittently.  It is important to prevent constipation and drink adequate amounts of liquids. You can stop taking this medication when you are not taking pain medication and you are back on your normal bowel routine.  Risks of Surgery Risks of surgery are low but include bleeding, infection, damage to surrounding structures, re-operation, blood clots, and very rarely death.   Blood Transfusion Information (For the consent to be signed before surgery)  We will be checking your blood type before surgery so in case of emergencies, we will know what type of blood you would need.                                            WHAT IS A BLOOD TRANSFUSION?  A transfusion is the replacement of blood or some of its parts. Blood is made up of multiple cells which provide different functions. Red blood cells carry oxygen and are used for blood loss replacement. White blood cells fight against infection. Platelets control bleeding. Plasma helps clot blood. Other blood products are available for specialized needs, such as hemophilia or other clotting disorders. BEFORE THE TRANSFUSION  Who gives blood for transfusions?  You may be able to donate blood to be used at a later date on yourself (autologous donation). Relatives can be asked to donate blood. This is generally not any safer than if you have received blood from a stranger. The same precautions are taken to ensure safety when a relative's blood is donated. Healthy volunteers who are fully evaluated to make sure their blood is safe. This is blood bank blood. Transfusion  therapy is the safest it has ever been in the practice of medicine. Before blood is taken from a donor, a complete history is taken to make sure that person has no history of diseases nor engages in risky social behavior (examples are intravenous drug use or sexual activity with multiple partners). The donor's travel history is screened to minimize risk of transmitting infections, such as malaria. The donated blood is tested for signs of infectious diseases, such as HIV and hepatitis. The blood is then tested to be sure it is compatible with you in order to minimize the chance of a transfusion reaction. If you or a relative donates blood, this is often done in anticipation of surgery and is not appropriate for emergency situations. It takes many days to process the donated blood. RISKS AND COMPLICATIONS Although transfusion therapy is very safe and saves many lives, the main dangers of transfusion include:    Getting an infectious disease. Developing a transfusion reaction. This is an allergic reaction to something in the blood you were given. Every precaution is taken to prevent this. The decision to have a blood transfusion has been considered carefully by your caregiver before blood is given. Blood is not given unless the benefits outweigh the risks.  AFTER SURGERY INSTRUCTIONS  Return to work: 4 weeks if applicable  Activity: 1. Be up and out of the bed during the day.  Take a nap if needed.  You may walk up steps but be careful and use the hand rail.  Stair climbing will tire you more than you think, you may need to stop part way and rest.   2. No lifting or straining for 6 weeks over 10 pounds. No pushing, pulling, straining for 6 weeks.  3. No driving for around 1 week(s).  Do not drive if you are taking narcotic pain medicine and make sure that your reaction time has returned.   4. You can shower as soon as the next day after surgery. Shower daily.  Use your regular soap and water (not  directly on the incision) and pat your incision(s) dry afterwards; don't rub.  No tub baths or submerging your body in water until cleared by your surgeon. If you have the soap that was given to you by pre-surgical testing that was used before surgery, you do not need to use it afterwards because this can irritate your incisions.   5. No sexual activity and nothing in the vagina for 8 weeks.  6. You may experience a small amount of clear drainage from your incisions, which is normal.  If the drainage persists, increases, or changes color please call the office.  7. Do not use creams, lotions, or ointments such as neosporin on your incisions after surgery until advised by your surgeon because they can cause removal of the dermabond glue on your incisions.    8. You may experience vaginal spotting after surgery or around the 6-8 week mark from surgery when the stitches at the top of the vagina begin to dissolve.  The spotting is normal but if you experience heavy bleeding, call our office.  9. Take Tylenol or ibuprofen first for pain and only use narcotic pain medication for severe pain not relieved by the Tylenol or Ibuprofen.  Monitor your Tylenol intake to a max of 4,000 mg in a 24 hour period. You can alternate these medications after surgery.  Diet: 1. Low sodium Heart Healthy Diet is recommended but you are cleared to resume your normal (before surgery) diet after your procedure.  2. It is safe to use a laxative, such as Miralax or Colace, if you have difficulty moving your bowels. You have been prescribed Sennakot at bedtime every evening to keep bowel movements regular and to prevent constipation.    Wound Care: 1. Keep clean and dry.  Shower daily.  Reasons to call the Doctor: Fever - Oral temperature greater than 100.4 degrees Fahrenheit Foul-smelling vaginal discharge Difficulty urinating Nausea and vomiting Increased pain at the site of the incision that is unrelieved with pain  medicine. Difficulty breathing with or without chest pain New calf pain especially if only on one side Sudden, continuing increased vaginal bleeding with or without clots.   Contacts: For questions or concerns you should contact:  Dr. Emma Rossi at [phone]  Melissa Cross, NP at [phone]  After Hours: call [phone] and have the GYN Oncologist paged/contacted (after 5 pm or on   the weekends).  Messages sent via mychart are for non-urgent matters and are not responded to after hours so for urgent needs, please call the after hours number.       

## 2020-12-14 ENCOUNTER — Telehealth: Payer: Self-pay | Admitting: *Deleted

## 2020-12-14 ENCOUNTER — Telehealth: Payer: Self-pay

## 2020-12-14 NOTE — Progress Notes (Signed)
[name]  [phone] [phone]     ID: Sheryl Mcmillan DOB: [birth year]  MR#: [id number]  IHW#:[id number]  Patient Care Team: Windell Hummingbird, PA-C as PCP - General (Physician Assistant) [name], RN as Oncology Nurse Navigator [name], RN as Oncology Nurse Navigator [name], MD as Consulting Physician (General Surgery) Magrinat, [name], MD as Consulting Physician (Oncology) [name], MD as Consulting Physician (Radiation Oncology) [name], MD as Consulting Physician (Plastic Surgery) [name], MD OTHER MD:  CHIEF COMPLAINT: estrogen receptor negative, Her2 positive breast cancer  CURRENT TREATMENT: Adjuvant immunotherapy   INTERVAL HISTORY: Sheryl Mcmillan returns today for follow up and treatment of her estrogen receptor negative, Her2 positive breast cancer accompanied by her daughter (who incidentally is BRCA negative)  Sheryl Mcmillan is now receiving trastuzumab alone, to complete a year.  Her port was removed and she is receiving this subcutaneously  Her most recent echocardiogram on 09/14/2020 showed an ejection fraction of 65-70%. This is stable from prior in 05/2020.  Since her last visit, she underwent bilateral mastectomies on 11/07/2020 under Dr. Donne Hazel. Pathology from the procedure [id number]) showed: Right Breast  - benign breast tissue 2. Left Breast  - invasive ductal carcinoma, 1.5 cm, grade 3   - margins not involved 3. Left Axillary Lymph Node  - negative for metastatic carcinoma (0/1)  She also met with Dr. Denman George on 12/09/2020 to discuss risk-reducing hysterectomy with BSO. She hopes to also undergo cholecystectomy at the same time.   REVIEW OF SYSTEMS: Sheryl Mcmillan is very pleased with the bilateral mastectomies she underwent.  She is certainly healing well from them.  She has expanders in place and hopes to begin the inflation soon.  She denies any unusual pain fever rash or bleeding.   She has a normal functional status.  A detailed review of systems today was otherwise stable    COVID 19 VACCINATION STATUS: Status post vaccine x2, no booster as of February 2022   HISTORY OF CURRENT ILLNESS: From the original intake note:   Sheryl Mcmillan presented with a palpable lump in the outer left breast, which she initially felt approximately 6 months prior. She waited to seek medical attention because she had no insurance.  Eventually she enrolled in the [facility] program and underwent bilateral diagnostic mammography with tomography and left breast ultrasonography at [facility] on 05/10/2020 showing: breast density category B; vaguely palpable 2.5 cm mass in left breast at 1:30; no pathologic left axillary lymphadenopathy or evidence of right breast malignancy.  Accordingly on 11/Sheryl/2021 she proceeded to biopsy of the left breast area in question. The pathology from this procedure ([id number]) showed: invasive ductal carcinoma, grade 3. Prognostic indicators significant for: estrogen receptor, 40% positive with weak staining intensity and progesterone receptor, 0% negative. Proliferation marker Ki67 at 50%. HER2 positive by immunohistochemistry (3+).  The patient's subsequent history is as detailed below.   PAST MEDICAL HISTORY: Past Medical History:  Diagnosis Date   Arthritis    Breast cancer ([facility])    Family history of non-Hodgkin's lymphoma 06/01/2020   Hemorrhoids    Migraines    Obesity    PCOS (polycystic ovarian syndrome)     PAST SURGICAL HISTORY: Past Surgical History:  Procedure Laterality Date   AXILLARY SENTINEL NODE BIOPSY Left 11/07/2020   Procedure: LEFT AXILLARY SENTINEL NODE BIOPSY;  Surgeon: [name], MD;  Location: [facility];  Service: General;  Laterality: Left;   BREAST RECONSTRUCTION WITH PLACEMENT OF  TISSUE EXPANDER AND FLEX HD (ACELLULAR HYDRATED DERMIS) Bilateral 11/07/2020   Procedure: BILATERAL BREAST RECONSTRUCTION WITH PLACEMENT OF TISSUE  EXPANDER AND FLEX HD (ACELLULAR HYDRATED DERMIS);  Surgeon: [name], MD;  Location: [facility];  Service: Plastics;  Laterality: Bilateral;   COSMETIC SURGERY     DILATION AND CURETTAGE OF UTERUS     HEMORRHOID SURGERY     IR IMAGING GUIDED PORT INSERTION  06/15/2020   PORT-A-CATH REMOVAL Right 11/07/2020   Procedure: REMOVAL PORT-A-CATH;  Surgeon: [name], MD;  Location: [facility];  Service: General;  Laterality: Right;   TOTAL MASTECTOMY Bilateral 11/07/2020   Procedure: BILATERAL MASTECTOMY;  Surgeon: [name], MD;  Location: [facility];  Service: General;  Laterality: Bilateral;    FAMILY HISTORY: Family History  Problem Relation Age of Onset   Hypertension Mother    Diabetes Mother    Cancer Mother 40       unknown primary   Other Mother        brain tumor; dx 35s   Non-Hodgkin's lymphoma Brother 6   Other Maternal Uncle 49       brain tumor   Breast cancer Other        MGM's niece; dx late 57s   Pancreatic cancer Neg Hx    Colon cancer Neg Hx    Endometrial cancer Neg Hx    Prostate cancer Neg Hx    Ovarian cancer Neg Hx    She has no information on her father. Her mother died at age 39 from stage IV cancer. The primary cancer was unknown, but it was not breast.  Sheryl Mcmillan has two half brothers (through her mother), one of which was diagnosed with non-Hodgkin's lymphoma. There is no family history of breast, ovarian, or prostate cancer to her knowledge. // Patient is BRCA1 positive. Daughter tested negative.   GYNECOLOGIC HISTORY:  No LMP recorded. (Menstrual status: Chemotherapy). Menarche: 45 years old Age at first live birth: 45 years old [place] P 1 LMP 05/02/2020 Contraceptive: never used HRT n/a  Hysterectomy? no BSO? 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 She is single. Her significant other Sheryl Mcmillan is a Freight forwarder. She lives at home with Sheryl Mcmillan, her daughter Sheryl Mcmillan,   Sheryl Mcmillan's daughter Sheryl Mcmillan and her 11-monthold daughter Sheryl Mcmillan Daughter [name] age [id number] is a bChief Operating Officerat [employer]tends [employer]    ADVANCED DIRECTIVES: Completed and notarized 09/07/2020.  She has named her aunt TMarveen Reeksas healthcare power of attorney   HEALTH MAINTENANCE: Social History   Tobacco Use   Smoking status: Former    Packs/day: 0.50    Years: 10.00    Pack years: 5.00    Types: Cigarettes    Quit date: 08/23/2020    Years since quitting: 0.3   Smokeless tobacco: Never  Vaping Use   Vaping Use: Never used  Substance Use Topics   Alcohol use: Yes    Comment: occasionally   Drug use: Yes    Types: Marijuana    Comment: 5-7 days/week     Colonoscopy: 2018  PAP: 2018  Bone density: n/a (age)   Allergies  Allergen Reactions   Carboplatin Hives and Itching    Carboplatin reaction after dose #5 requiring Benadryl, Solumedrol, epinephrine, Claritin.   Other Hives    Baking Soda   Wound Dressing Adhesive Hives    Rips skin  Current Outpatient Medications  Medication Sig Dispense Refill   Acetaminophen (TYLENOL ARTHRITIS PAIN PO) Take by mouth. As needed     Multiple Vitamins-Minerals (ONE-A-DAY WOMENS PO) Take 1 tablet by mouth daily.     oxyCODONE (OXY IR/ROXICODONE) 5 MG immediate release tablet Take 1 tablet (5 mg total) by mouth every 4 (four) hours as needed for severe pain. For AFTER surgery only, do not take and drive 15 tablet 0   senna-docusate (SENOKOT-S) 8.6-50 MG tablet Take 2 tablets by mouth at bedtime. For AFTER surgery only, do not take if having diarrhea 30 tablet 0   No current facility-administered medications for this visit.   Facility-Administered Medications Ordered in Other Visits  Medication Dose Route Frequency Provider Last Rate Last Admin   sodium chloride flush (NS) 0.9 % injection 10 mL  10 mL Intravenous PRN Causey, Charlestine Massed, NP        OBJECTIVE: White Mcmillan who appears  stated age 45:   12/15/20 1005  BP: (!) 142/88  Pulse: 84  Resp: 18  Temp: 97.7 F (36.5 C)  SpO2: 100%   Body mass index is 44.77 kg/m. Filed Weights   12/15/20 1005  Weight: (!) 321 lb (145.6 kg)   Sclerae unicteric, EOMs intact Wearing a mask No cervical or supraclavicular adenopathy Lungs no rales or rhonchi Heart regular rate and rhythm Abd soft, nontender, positive bowel sounds MSK no focal spinal tenderness, no upper extremity lymphedema Neuro: nonfocal, well oriented, appropriate affect Breasts: Status post bilateral mastectomies.  The incisions are healing nicely without dehiscence erythema or swelling.   LAB RESULTS:  CMP     Component Value Date/Time   NA 142 12/15/2020 0953   K 4.0 12/15/2020 0953   CL 109 12/15/2020 0953   CO2 21 (L) 12/15/2020 0953   GLUCOSE 120 (H) 12/15/2020 0953   BUN 14 12/15/2020 0953   CREATININE 0.79 12/15/2020 0953   CALCIUM 9.2 12/15/2020 0953   PROT 6.8 12/15/2020 0953   ALBUMIN 3.6 12/15/2020 0953   AST 12 (L) 12/15/2020 0953   ALT 16 12/15/2020 0953   ALKPHOS 75 12/15/2020 0953   BILITOT <0.2 (L) 12/15/2020 0953   GFRNONAA >60 12/15/2020 0953   GFRAA >60 06/06/2019 1205    No results found for: TOTALPROTELP, ALBUMINELP, A1GS, A2GS, BETS, BETA2SER, GAMS, MSPIKE, SPEI  Lab Results  Component Value Date   WBC 6.0 12/15/2020   NEUTROABS 3.1 12/15/2020   HGB 11.2 (L) 12/15/2020   HCT 34.6 (L) 12/15/2020   MCV 89.4 12/15/2020   PLT 280 12/15/2020    No results found for: LABCA2  No components found for: XMIWOE321  No results for input(s): INR in the last 168 hours.  No results found for: LABCA2  No results found for: CAN199  Lab Results  Component Value Date   CAN125 10.2 07/28/2020    No results found for: YYQ825  No results found for: CA2729  No components found for: HGQUANT  No results found for: CEA1 / No results found for: CEA1   No results found for: AFPTUMOR  No results found for:  CHROMOGRNA  No results found for: KPAFRELGTCHN, LAMBDASER, KAPLAMBRATIO (kappa/lambda light chains)  No results found for: HGBA, HGBA2QUANT, HGBFQUANT, HGBSQUAN (Hemoglobinopathy evaluation)   No results found for: LDH  No results found for: IRON, TIBC, IRONPCTSAT (Iron and TIBC)  No results found for: FERRITIN  Urinalysis    Component Value Date/Time   COLORURINE YELLOW 06/06/2019 [place] 06/06/2019 1205  LABSPEC >1.030 (H) 06/06/2019 1205   PHURINE 5.5 06/06/2019 1205   GLUCOSEU NEGATIVE 06/06/2019 1205   HGBUR TRACE (A) 06/06/2019 1205   BILIRUBINUR NEGATIVE 06/06/2019 [place] 06/06/2019 1205   PROTEINUR NEGATIVE 06/06/2019 1205   UROBILINOGEN 1.0 05/09/2011 1805   NITRITE NEGATIVE 06/06/2019 1205   LEUKOCYTESUR NEGATIVE 06/06/2019 1205    STUDIES: No results found.   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  (b) no additional deleterious mutations through the Multi-Cancer Panel offered by [facility] including AIP, ALK, APC, ATM, AXIN2,BAP1,  BARD1, BLM, BMPR1A, BRCA1, BRCA2, BRIP1, CASR, CDC73, CDH1, CDK4, CDKN1B, CDKN1C, CDKN2A (p14ARF), CDKN2A (p16INK4a), CEBPA, CHEK2, CTNNA1, DICER1, DIS3L2, EGFR (c.2369C>T, p.Thr790Met variant only), EPCAM (Deletion/duplication testing only), FH, FLCN, GATA2, GPC3, GREM1 (Promoter region deletion/duplication testing only), HOXB13 (c.251G>A, p.Gly84Glu), HRAS, KIT, MAX, MEN1, MET, MITF (c.952G>A, p.Glu318Lys variant  only), MLH1, MSH2, MSH3, MSH6, MUTYH, NBN, NF1, NF2, NTHL1, PALB2, PDGFRA, PHOX2B, PMS2, POLD1, POLE, POT1, PRKAR1A, PTCH1, PTEN, RAD50, RAD51C, RAD51D, RB1, RECQL4, RET, RNF43, RUNX1, SDHAF2, SDHA (sequence changes only), SDHB, SDHC, SDHD, SMAD4, SMARCA4, SMARCB1, SMARCE1, STK11, SUFU, TERC, TERT, TMEM127, TP53, TSC1, TSC2, VHL, WRN and WT1.   (2) neoadjuvant chemotherapy to consist of carboplatin and docetaxel together with trastuzumab and Pertuzumab every 3 weeks x 6, starting 06/16/2020, completed 09/28/2020   (a) pertuzumab omitted after cycle 1  (3) trastuzumab to be continued to complete 1 year (through December 2022)  (a) echo 06/09/2020 shows EF in the 60-65% range  (b) echo 09/14/2020 shows an ejection fraction in 65-70% range.  (4) status post bilateral mastectomies 11/07/2020 showing  (A) on the right, no evidence of malignancy  (B) on the left, residual yp T1c yp N0 invasive ductal carcinoma, grade 3, with negative margins, now triple negative, with an MIB-1 of 15%.  (5) bilateral salpingo-oophorectomy planned for August 2022  (6) olaparib to start September 2022   PLAN:  Sheryl Mcmillan did very well with her surgery and we are continuing anti-HER2 treatment to complete a year.  Given her residual disease we considered switching her to T-DM1.  However her port has been removed and she will is comfortable receiving the trastuzumab subcutaneously.  In addition the residual disease is now triple negative, which is something we do observe.  Accordingly we are continuing the trastuzumab and not switching at this point  She will be scheduled for bilateral salpingo-oophorectomy and possibly hysterectomy in August and she hopes to have a cholecystectomy at the same time.  She will see me in 6 weeks.  She knows to call for any other issue admittable before that visit  Total encounter time 25 minutes.   [name]. Ryelee Albee, MD 12/15/20 7:01 PM Medical Oncology and Hematology [facility], [place] [address] Tel. [phone]    Fax. [phone]   I, Wilburn Mylar, am acting as scribe for Dr. [name]. Farouk Vivero.  I, Lurline Del MD, have reviewed the above documentation for accuracy and completeness, and I agree with the above.   *Total Encounter Time as defined by the  Centers for Medicare and Medicaid Services includes, in addition to the face-to-face time of a patient visit (documented in the note above) non-face-to-face time: obtaining and reviewing outside history, ordering and reviewing medications, tests or procedures, care coordination (communications with other health care professionals or caregivers) and documentation in the medical record.

## 2020-12-14 NOTE — Telephone Encounter (Signed)
Patient reports that since yesterday she is experiencing a burning pain with swelling on the right side towards her chest area. She took Tylenol yesterday with no relief. She denies fever/chills.

## 2020-12-14 NOTE — Telephone Encounter (Signed)
Late entry---referral fax on 6/17 and 6/20 to [facility] surgery and was given an appt with Dr Hassell Done on 7/20 at 10:15 am

## 2020-12-15 ENCOUNTER — Other Ambulatory Visit: Payer: Medicaid Other

## 2020-12-15 ENCOUNTER — Inpatient Hospital Stay: Payer: Medicaid Other

## 2020-12-15 ENCOUNTER — Other Ambulatory Visit: Payer: Self-pay

## 2020-12-15 ENCOUNTER — Inpatient Hospital Stay (HOSPITAL_BASED_OUTPATIENT_CLINIC_OR_DEPARTMENT_OTHER): Payer: Medicaid Other | Admitting: Oncology

## 2020-12-15 ENCOUNTER — Ambulatory Visit: Payer: Medicaid Other | Admitting: Physical Therapy

## 2020-12-15 VITALS — BP 142/88 | HR 84 | Temp 97.7°F | Resp 18 | Ht 71.0 in | Wt 321.0 lb

## 2020-12-15 VITALS — BP 140/92 | HR 71 | Temp 98.1°F | Resp 18

## 2020-12-15 DIAGNOSIS — C50412 Malignant neoplasm of upper-outer quadrant of left female breast: Secondary | ICD-10-CM

## 2020-12-15 DIAGNOSIS — Z170 Estrogen receptor positive status [ER+]: Secondary | ICD-10-CM

## 2020-12-15 DIAGNOSIS — Z1501 Genetic susceptibility to malignant neoplasm of breast: Secondary | ICD-10-CM

## 2020-12-15 DIAGNOSIS — M25611 Stiffness of right shoulder, not elsewhere classified: Secondary | ICD-10-CM

## 2020-12-15 DIAGNOSIS — Z1509 Genetic susceptibility to other malignant neoplasm: Secondary | ICD-10-CM

## 2020-12-15 DIAGNOSIS — M25612 Stiffness of left shoulder, not elsewhere classified: Secondary | ICD-10-CM

## 2020-12-15 DIAGNOSIS — Z483 Aftercare following surgery for neoplasm: Secondary | ICD-10-CM

## 2020-12-15 LAB — CMP (CANCER CENTER ONLY)
ALT: 16 U/L (ref 0–44)
AST: 12 U/L — ABNORMAL LOW (ref 15–41)
Albumin: 3.6 g/dL (ref 3.5–5.0)
Alkaline Phosphatase: 75 U/L (ref 38–126)
Anion gap: 12 (ref 5–15)
BUN: 14 mg/dL (ref 6–20)
CO2: 21 mmol/L — ABNORMAL LOW (ref 22–32)
Calcium: 9.2 mg/dL (ref 8.9–10.3)
Chloride: 109 mmol/L (ref 98–111)
Creatinine: 0.79 mg/dL (ref 0.44–1.00)
GFR, Estimated: >60 mL/min (ref >60)
Glucose, Bld: 120 mg/dL — ABNORMAL HIGH (ref 70–99)
Potassium: 4 mmol/L (ref 3.5–5.1)
Sodium: 142 mmol/L (ref 135–145)
Total Bilirubin: <0.2 mg/dL — ABNORMAL LOW (ref 0.3–1.2)
Total Protein: 6.8 g/dL (ref 6.5–8.1)

## 2020-12-15 LAB — CBC WITH DIFFERENTIAL (CANCER CENTER ONLY)
Abs Immature Granulocytes: 0.01 10*3/uL (ref 0.00–0.07)
Basophils Absolute: 0 10*3/uL (ref 0.0–0.1)
Basophils Relative: 0 %
Eosinophils Absolute: 0.2 10*3/uL (ref 0.0–0.5)
Eosinophils Relative: 3 %
HCT: 34.6 % — ABNORMAL LOW (ref 36.0–46.0)
Hemoglobin: 11.2 g/dL — ABNORMAL LOW (ref 12.0–15.0)
Immature Granulocytes: 0 %
Lymphocytes Relative: 40 %
Lymphs Abs: 2.4 10*3/uL (ref 0.7–4.0)
MCH: 28.9 pg (ref 26.0–34.0)
MCHC: 32.4 g/dL (ref 30.0–36.0)
MCV: 89.4 fL (ref 80.0–100.0)
Monocytes Absolute: 0.3 10*3/uL (ref 0.1–1.0)
Monocytes Relative: 5 %
Neutro Abs: 3.1 10*3/uL (ref 1.7–7.7)
Neutrophils Relative %: 52 %
Platelet Count: 280 10*3/uL (ref 150–400)
RBC: 3.87 MIL/uL (ref 3.87–5.11)
RDW: 14 % (ref 11.5–15.5)
WBC Count: 6 10*3/uL (ref 4.0–10.5)
nRBC: 0 % (ref 0.0–0.2)

## 2020-12-15 MED ORDER — ACETAMINOPHEN 325 MG PO TABS
ORAL_TABLET | ORAL | Status: AC
  Filled 2020-12-15: qty 2

## 2020-12-15 MED ORDER — ACETAMINOPHEN 325 MG PO TABS
650.0000 mg | ORAL_TABLET | Freq: Once | ORAL | Status: AC
Start: 2020-12-15 — End: 2020-12-15
  Administered 2020-12-15: 650 mg via ORAL

## 2020-12-15 MED ORDER — DIPHENHYDRAMINE HCL 25 MG PO CAPS
25.0000 mg | ORAL_CAPSULE | Freq: Once | ORAL | Status: AC
  Administered 2020-12-15: 25 mg via ORAL

## 2020-12-15 MED ORDER — TRASTUZUMAB-HYALURONIDASE-OYSK 600-10000 MG-UNT/5ML ~~LOC~~ SOLN
600.0000 mg | Freq: Once | SUBCUTANEOUS | Status: AC
Start: 2020-12-15 — End: 2020-12-15
  Administered 2020-12-15: 600 mg via SUBCUTANEOUS
  Filled 2020-12-15: qty 5

## 2020-12-15 MED ORDER — DIPHENHYDRAMINE HCL 25 MG PO CAPS
ORAL_CAPSULE | ORAL | Status: AC
  Filled 2020-12-15: qty 1

## 2020-12-15 NOTE — Patient Instructions (Signed)
[facility]   Discharge Instructions: Thank you for choosing [facility] to provide your oncology and hematology care.   If you have a lab appointment with the [facility], please go directly to the [facility] and check in at the registration area.   Wear comfortable clothing and clothing appropriate for easy access to any Portacath or PICC line.   We strive to give you quality time with your provider. You may need to reschedule your appointment if you arrive late (15 or more minutes).  Arriving late affects you and other patients whose appointments are after yours.  Also, if you miss three or more appointments without notifying the office, you may be dismissed from the clinic at the provider's discretion.      For prescription refill requests, have your pharmacy contact our office and allow 72 hours for refills to be completed.    Today you received the following chemotherapy and/or immunotherapy agents: Trastuzumab hyaluronidase (Herceptin hylecta)      To help prevent nausea and vomiting after your treatment, we encourage you to take your nausea medication as directed.  BELOW ARE SYMPTOMS THAT SHOULD BE REPORTED IMMEDIATELY: *FEVER GREATER THAN 100.4 F (38 C) OR HIGHER *CHILLS OR SWEATING *NAUSEA AND VOMITING THAT IS NOT CONTROLLED WITH YOUR NAUSEA MEDICATION *UNUSUAL SHORTNESS OF BREATH *UNUSUAL BRUISING OR BLEEDING *URINARY PROBLEMS (pain or burning when urinating, or frequent urination) *BOWEL PROBLEMS (unusual diarrhea, constipation, pain near the anus) TENDERNESS IN MOUTH AND THROAT WITH OR WITHOUT PRESENCE OF ULCERS (sore throat, sores in mouth, or a toothache) UNUSUAL RASH, SWELLING OR PAIN  UNUSUAL VAGINAL DISCHARGE OR ITCHING   Items with * indicate a potential emergency and should be followed up as soon as possible or go to the Emergency Department if any problems should occur.  Please show the CHEMOTHERAPY ALERT CARD or  IMMUNOTHERAPY ALERT CARD at check-in to the Emergency Department and triage nurse.  Should you have questions after your visit or need to cancel or reschedule your appointment, please contact [facility]  Dept: [phone]  and follow the prompts.  Office hours are 8:00 a.m. to 4:30 p.m. Monday - Friday. Please note that voicemails left after 4:00 p.m. may not be returned until the following business day.  We are closed weekends and major holidays. You have access to a nurse at all times for urgent questions. Please call the main number to the clinic Dept: [phone] and follow the prompts.   For any non-urgent questions, you may also contact your provider using MyChart. We now offer e-Visits for anyone 77 and older to request care online for non-urgent symptoms. For details visit [email or web address].   Also download the MyChart app! Go to the app store, search "MyChart", open the app, select [facility], and log in with your MyChart username and password.  Due to Covid, a mask is required upon entering the hospital/clinic. If you do not have a mask, one will be given to you upon arrival. For doctor visits, patients may have 1 support person aged 18 or older with them. For treatment visits, patients cannot have anyone with them due to current Covid guidelines and our immunocompromised population.

## 2020-12-15 NOTE — Therapy (Signed)
[place], [place], [address] Phone: [phone]   Fax:  [phone]  Physical Therapy Treatment  Patient Details  Name: Sheryl Mcmillan MRN: [id number] Date of Birth: 02-17-1976 Referring Provider (PT): Dr. [name]   Encounter Date: 12/15/2020   PT End of Session - 12/15/20 1708     Visit Number 4    Number of Visits 10    Date for PT Re-Evaluation 01/02/21    PT Start Time 1610    PT Stop Time 1650    PT Time Calculation (min) 40 min    Activity Tolerance Patient tolerated treatment well    Behavior During Therapy [facility] for tasks assessed/performed             Past Medical History:  Diagnosis Date   Arthritis    Breast cancer ([place])    Family history of non-Hodgkin's lymphoma 06/01/2020   Hemorrhoids    Migraines    Obesity    PCOS (polycystic ovarian syndrome)     Past Surgical History:  Procedure Laterality Date   AXILLARY SENTINEL NODE BIOPSY Left 11/07/2020   Procedure: LEFT AXILLARY SENTINEL NODE BIOPSY;  Surgeon: [name], MD;  Location: [place];  Service: General;  Laterality: Left;   BREAST RECONSTRUCTION WITH PLACEMENT OF TISSUE EXPANDER AND FLEX HD (ACELLULAR HYDRATED DERMIS) Bilateral 11/07/2020   Procedure: BILATERAL BREAST RECONSTRUCTION WITH PLACEMENT OF TISSUE EXPANDER AND FLEX HD (ACELLULAR HYDRATED DERMIS);  Surgeon: [name], MD;  Location: [place];  Service: Plastics;  Laterality: Bilateral;   COSMETIC SURGERY     DILATION AND CURETTAGE OF UTERUS     HEMORRHOID SURGERY     IR IMAGING GUIDED PORT INSERTION  06/15/2020   PORT-A-CATH REMOVAL Right 11/07/2020   Procedure: REMOVAL PORT-A-CATH;  Surgeon: [name], MD;  Location: [place];  Service: General;  Laterality: Right;   TOTAL MASTECTOMY Bilateral 11/07/2020   Procedure: BILATERAL MASTECTOMY;  Surgeon: [name], MD;  Location: [place];  Service: General;  Laterality: Bilateral;     There were no vitals filed for this visit.   Subjective Assessment - 12/15/20 1615     Subjective Pt states that she had some leaking from her left breast. She has still been doing some exercises but has not been stretching it as much recently.  She had an herceptin injection today.  She is most concerned about the the fullness she has at right lateral chest. She is not able to get her arm down to her side    Pertinent History Patient was diagnosed on 05/10/2020 with left grade III invasive ductal carcinoma breast cancer. She had a bilateral mastectomy and left sentinel node biopsy (1 negative node) on 11/07/2020 It is ER positive, PR negative, and HER2 positive with a Ki67 of 50%. She underwent plastic surgery to her left arm at age 34 when her arm was severely cut in a glass door.    Patient Stated Goals Get arm moving normally and pain under control. Unable to wipe bottom.    Currently in Pain? No/denies                [facility] PT Assessment - 12/15/20 0001       Observation/Other Assessments   Observations Pt comes in wearing 2 ABD pads under ace wrap around chest. She says that she has been using the ace wraps around her chest since sugery. Her left breast has a dark scabbed area at central portion of  incsion that she says has been draining. She has seen the doctor about it last week and will go again next week. She has nonpitting full area at right lateral chest that she says really bothers her.                           [facility] Adult PT Treatment/Exercise - 12/15/20 0001       Exercises   Exercises Shoulder      Shoulder Exercises: ROM/Strengthening   Wall Wash for right arm only with folded towel up the wall into flexion as tolerated.      Manual Therapy   Manual Therapy Edema management;Manual Lymphatic Drainage (MLD)    Manual therapy comments 2 ABD pads to chest covered with ace bandage and top of dress which has compression component to it    Edema  Management gave pt piece of lined foam on white foam for lateral chest to wera inside ace wrap.    Passive ROM PROM to bilateral shoulder rotation with arms  below 90 degrees of flexion                         PT Long Term Goals - 12/05/20 1404       PT LONG TERM GOAL #1   Title Patient will demonstrate she has regained full shoulder ROM and function post opeeratively compared to baselines.    Time 4    Period Weeks    Status On-going    Target Date 01/02/21      PT LONG TERM GOAL #2   Title Patient will increase bilateral shoulder active flexion to >/= 150 degrees for increased ease reaching overhead.    Time 4    Period Weeks    Status New    Target Date 01/02/21      PT LONG TERM GOAL #3   Title Patient will increase bilateral shoulder active abduction to >/= 140 degrees for increased ease reaching overhead.    Time 4    Period Weeks    Status New    Target Date 01/02/21      PT LONG TERM GOAL #4   Title Patient will decrease her DASH score to </= 38.64 to achieve baseline arm function.    Baseline 54.55    Time 4    Period Weeks    Status New    Target Date 01/02/21      PT LONG TERM GOAL #5   Title Patient will verbalize good understanding of lympehdema risk reduction practices.    Baseline No knowledge    Time 4    Period Weeks    Status New    Target Date 01/02/21      Additional Long Term Goals   Additional Long Term Goals Yes      PT LONG TERM GOAL #6   Title Patient will report she has decreased pain by >/= 50% with daily tasks to reduce need for medication and tolerate normal ADLs.    Baseline Pain currently varies from 5-10/10 with most daily tasks.    Time 4    Period Weeks    Status New    Target Date 01/02/21                   Plan - 12/15/20 1709     Clinical Impression Statement Pt is improving in shoulder ROM, but did not exercise left arm  overhead today as she has had some drainage from left incision and did not  want to stress it. She is most bothered by fullness at right lateral chest and began MLD to this area.    Stability/Clinical Decision Making Stable/Uncomplicated    Rehab Potential Excellent    PT Frequency 2x / week    PT Duration 4 weeks    PT Treatment/Interventions ADLs/Self Care Home Management;Therapeutic exercise;Patient/family education;Manual techniques;Passive range of motion;Scar mobilization    PT Next Visit Plan did foam patch help?  Continue MLD to right lateral chest. monitor left chest incision and avoid stretching skin here . Cont  AAROM, PROM, AROM, STM prn    Consulted and Agree with Plan of Care Patient             Patient will benefit from skilled therapeutic intervention in order to improve the following deficits and impairments:  Postural dysfunction, Decreased range of motion, Decreased knowledge of precautions, Impaired UE functional use, Pain, Decreased scar mobility  Visit Diagnosis: Aftercare following surgery for neoplasm  Stiffness of left shoulder, not elsewhere classified  Stiffness of right shoulder, not elsewhere classified     Problem List Patient Active Problem List   Diagnosis Date Noted   Peripheral neuropathy due to chemotherapy ([place]) 10/20/2020   Morbid obesity with BMI of 40.0-44.9, adult ([place]) 07/19/2020   Port-A-Cath in place 07/07/2020   BRCA1 gene mutation positive 07/04/2020   Genetic testing 06/28/2020   Family history of non-Hodgkin's lymphoma 06/01/2020   Malignant neoplasm of upper-outer quadrant of left breast in female, estrogen receptor positive ([place]) 05/26/2020   Donato Heinz. Owens Shark PT  Norwood Levo 12/15/2020, 5:14 PM  [place], [place], [address] Phone: [phone]   Fax:  [phone]  Name: Sheryl Mcmillan MRN: [id number] Date of Birth: Apr 04, 1976

## 2020-12-16 ENCOUNTER — Encounter: Payer: Self-pay | Admitting: Oncology

## 2020-12-16 ENCOUNTER — Encounter: Payer: Self-pay | Admitting: Rehabilitation

## 2020-12-16 ENCOUNTER — Ambulatory Visit: Payer: Medicaid Other | Admitting: Rehabilitation

## 2020-12-16 DIAGNOSIS — R293 Abnormal posture: Secondary | ICD-10-CM

## 2020-12-16 DIAGNOSIS — C50412 Malignant neoplasm of upper-outer quadrant of left female breast: Secondary | ICD-10-CM | POA: Diagnosis not present

## 2020-12-16 DIAGNOSIS — Z483 Aftercare following surgery for neoplasm: Secondary | ICD-10-CM

## 2020-12-16 DIAGNOSIS — M25612 Stiffness of left shoulder, not elsewhere classified: Secondary | ICD-10-CM

## 2020-12-16 DIAGNOSIS — M25611 Stiffness of right shoulder, not elsewhere classified: Secondary | ICD-10-CM

## 2020-12-16 LAB — CA 125: Cancer Antigen (CA) 125: 7.4 U/mL (ref 0.0–38.1)

## 2020-12-16 NOTE — Telephone Encounter (Signed)
Called and [facility] @ 5:14pm on (12-14-20).//AB/CMA

## 2020-12-16 NOTE — Therapy (Signed)
[place], [place], [address] Phone: [phone]   Fax:  [phone]  Physical Therapy Treatment  Patient Details  Name: Sheryl Mcmillan MRN: [id number] Date of Birth: Nov 27, 1975 Referring Provider (PT): Dr. [name]   Encounter Date: 12/16/2020   PT End of Session - 12/16/20 1055     Visit Number 5    Number of Visits 10    Date for PT Re-Evaluation 01/02/21    Authorization Type [employer] Medicaid - automatically authorized for 12 visits; will need to seek approval if you need more than 12.    Authorization - Visit Number 5    Authorization - Number of Visits 12    PT Start Time 3235    PT Stop Time 1053    PT Time Calculation (min) 46 min    Activity Tolerance Patient tolerated treatment well;Treatment limited secondary to medical complications (Comment)    Behavior During Therapy [facility] for tasks assessed/performed             Past Medical History:  Diagnosis Date   Arthritis    Breast cancer ([place])    Family history of non-Hodgkin's lymphoma 06/01/2020   Hemorrhoids    Migraines    Obesity    PCOS (polycystic ovarian syndrome)     Past Surgical History:  Procedure Laterality Date   AXILLARY SENTINEL NODE BIOPSY Left 11/07/2020   Procedure: LEFT AXILLARY SENTINEL NODE BIOPSY;  Surgeon: [name], MD;  Location: [place];  Service: General;  Laterality: Left;   BREAST RECONSTRUCTION WITH PLACEMENT OF TISSUE EXPANDER AND FLEX HD (ACELLULAR HYDRATED DERMIS) Bilateral 11/07/2020   Procedure: BILATERAL BREAST RECONSTRUCTION WITH PLACEMENT OF TISSUE EXPANDER AND FLEX HD (ACELLULAR HYDRATED DERMIS);  Surgeon: [name], MD;  Location: [place];  Service: Plastics;  Laterality: Bilateral;   COSMETIC SURGERY     DILATION AND CURETTAGE OF UTERUS     HEMORRHOID SURGERY     IR IMAGING GUIDED PORT INSERTION  06/15/2020   PORT-A-CATH REMOVAL Right 11/07/2020   Procedure: REMOVAL  PORT-A-CATH;  Surgeon: [name], MD;  Location: [place];  Service: General;  Laterality: Right;   TOTAL MASTECTOMY Bilateral 11/07/2020   Procedure: BILATERAL MASTECTOMY;  Surgeon: [name], MD;  Location: [place];  Service: General;  Laterality: Bilateral;    There were no vitals filed for this visit.   Subjective Assessment - 12/16/20 1007     Subjective The breast is leaking less I think    Pertinent History Patient was diagnosed on 05/10/2020 with left grade III invasive ductal carcinoma breast cancer. She had a bilateral mastectomy and left sentinel node biopsy (1 negative node) on 11/07/2020 It is ER positive, PR negative, and HER2 positive with a Ki67 of 50%. She underwent plastic surgery to her left arm at age 8 when her arm was severely cut in a glass door.    Patient Stated Goals Get arm moving normally and pain under control. Unable to wipe bottom.    Currently in Pain? No/denies                               [facility] Adult PT Treatment/Exercise - 12/16/20 0001       Manual Therapy   Manual Therapy Manual Lymphatic Drainage (MLD)    Edema Management pt still doing well with self bandaging and recommnded small towel roll in the axilla    Manual Lymphatic  Drainage (MLD) Focused on MLD today to bilateral chest avoiding open area and not over either expander; short neck, superficial and deep abominals, bil axillary nodes, bil inguinal nodes, sternal nodes, then Rt side working towards axilla and inguinals due to intanct system and then left side more inguinal in direction but also slightly towards axilla due to only 1 node removed.                         PT Long Term Goals - 12/05/20 1404       PT LONG TERM GOAL #1   Title Patient will demonstrate she has regained full shoulder ROM and function post opeeratively compared to baselines.    Time 4    Period Weeks    Status On-going    Target Date 01/02/21      PT LONG TERM GOAL  #2   Title Patient will increase bilateral shoulder active flexion to >/= 150 degrees for increased ease reaching overhead.    Time 4    Period Weeks    Status New    Target Date 01/02/21      PT LONG TERM GOAL #3   Title Patient will increase bilateral shoulder active abduction to >/= 140 degrees for increased ease reaching overhead.    Time 4    Period Weeks    Status New    Target Date 01/02/21      PT LONG TERM GOAL #4   Title Patient will decrease her DASH score to </= 38.64 to achieve baseline arm function.    Baseline 54.55    Time 4    Period Weeks    Status New    Target Date 01/02/21      PT LONG TERM GOAL #5   Title Patient will verbalize good understanding of lympehdema risk reduction practices.    Baseline No knowledge    Time 4    Period Weeks    Status New    Target Date 01/02/21      Additional Long Term Goals   Additional Long Term Goals Yes      PT LONG TERM GOAL #6   Title Patient will report she has decreased pain by >/= 50% with daily tasks to reduce need for medication and tolerate normal ADLs.    Baseline Pain currently varies from 5-10/10 with most daily tasks.    Time 4    Period Weeks    Status New    Target Date 01/02/21                   Plan - 12/16/20 1056     Clinical Impression Statement Focused on MLD today to relieve discomfort and size of bilateral lateral trunk dog ears / bil anterior chest and axilla.  Did not a firmer seroma like feeling in the Rt axilla which may be more what pt notes in the Rt axilla that is "like a tylenol bottle" Overall pt reports this area feeling a bit softer but not overall different post MLD.  Pt aware that it is hard to differentiate edema from excess skin but that it is worth trying.  Pt given brief self MLD instruction    PT Frequency 2x / week    PT Duration 4 weeks    PT Treatment/Interventions ADLs/Self Care Home Management;Therapeutic exercise;Patient/family education;Manual  techniques;Passive range of motion;Scar mobilization    PT Next Visit Plan Continue MLD to bil  chest at least  until effectiveness is determined. monitor left chest incision and avoid stretching skin here . Cont  AAROM, PROM, AROM, STM prn    Consulted and Agree with Plan of Care Patient             Patient will benefit from skilled therapeutic intervention in order to improve the following deficits and impairments:     Visit Diagnosis: Aftercare following surgery for neoplasm  Stiffness of left shoulder, not elsewhere classified  Stiffness of right shoulder, not elsewhere classified  Abnormal posture  Malignant neoplasm of upper-outer quadrant of left breast in female, estrogen receptor positive ([place])     Problem List Patient Active Problem List   Diagnosis Date Noted   Peripheral neuropathy due to chemotherapy ([place]) 10/20/2020   Morbid obesity with BMI of 40.0-44.9, adult ([place]) 07/19/2020   Port-A-Cath in place 07/07/2020   BRCA1 gene mutation positive 07/04/2020   Genetic testing 06/28/2020   Family history of non-Hodgkin's lymphoma 06/01/2020   Malignant neoplasm of upper-outer quadrant of left breast in female, estrogen receptor positive ([place]) 05/26/2020    Stark Bray 12/16/2020, 10:59 AM  [place], [place], [address] Phone: [phone]   Fax:  [phone]  Name: Sheryl Mcmillan MRN: [id number] Date of Birth: 05/20/1976

## 2020-12-16 NOTE — Telephone Encounter (Signed)
Called and spoke with the patient on (12/15/20) regarding the message below.  Informed the patient that we spoke with Dr. [name] on (12/14/20), and he would like for her to increase the Tylenol and make sure she keeps her appointment next week.  Patient verbalized understanding and agreed.    She stated she just wanted to know if she could use ice for the swelling.    Informed the patient that I spoke with [facility] and he stated that yes she can use ice just do not keep it on the area to long, and don't place the ice directly on the area.  Patient verbalized understanding and agreed.  She stated that she has a bag to place the ice in so it not directly on her skin.//AB/CMA

## 2020-12-16 NOTE — Patient Instructions (Signed)
Self Massage directions:  5-10 deep breaths  Gentle circles over each collarbone  Circles in each armpit

## 2020-12-20 ENCOUNTER — Other Ambulatory Visit: Payer: Self-pay

## 2020-12-20 ENCOUNTER — Ambulatory Visit: Payer: Medicaid Other

## 2020-12-20 DIAGNOSIS — Z483 Aftercare following surgery for neoplasm: Secondary | ICD-10-CM

## 2020-12-20 DIAGNOSIS — R293 Abnormal posture: Secondary | ICD-10-CM

## 2020-12-20 DIAGNOSIS — M79622 Pain in left upper arm: Secondary | ICD-10-CM

## 2020-12-20 DIAGNOSIS — M25611 Stiffness of right shoulder, not elsewhere classified: Secondary | ICD-10-CM

## 2020-12-20 DIAGNOSIS — C50412 Malignant neoplasm of upper-outer quadrant of left female breast: Secondary | ICD-10-CM

## 2020-12-20 DIAGNOSIS — M25612 Stiffness of left shoulder, not elsewhere classified: Secondary | ICD-10-CM

## 2020-12-20 DIAGNOSIS — M79621 Pain in right upper arm: Secondary | ICD-10-CM

## 2020-12-20 NOTE — Therapy (Signed)
[place], [place], [address] Phone: [phone]   Fax:  [phone]  Physical Therapy Treatment  Patient Details  Name: Sheryl Mcmillan MRN: [id number] Date of Birth: 1975-10-21 Referring Provider (PT): Dr. [name]   Encounter Date: 12/20/2020   PT End of Session - 12/20/20 0909     Visit Number 6    Number of Visits 10    Date for PT Re-Evaluation 01/02/21    Authorization Type [employer] Medicaid - automatically authorized for 12 visits; will need to seek approval if you need more than 12.    Authorization - Visit Number 6    Authorization - Number of Visits 12    PT Start Time 0807    PT Stop Time 0902    PT Time Calculation (min) 55 min    Activity Tolerance Patient tolerated treatment well;Treatment limited secondary to medical complications (Comment)    Behavior During Therapy [facility] for tasks assessed/performed             Past Medical History:  Diagnosis Date   Arthritis    Breast cancer ([place])    Family history of non-Hodgkin's lymphoma 06/01/2020   Hemorrhoids    Migraines    Obesity    PCOS (polycystic ovarian syndrome)     Past Surgical History:  Procedure Laterality Date   AXILLARY SENTINEL NODE BIOPSY Left 11/07/2020   Procedure: LEFT AXILLARY SENTINEL NODE BIOPSY;  Surgeon: [name], MD;  Location: [place];  Service: General;  Laterality: Left;   BREAST RECONSTRUCTION WITH PLACEMENT OF TISSUE EXPANDER AND FLEX HD (ACELLULAR HYDRATED DERMIS) Bilateral 11/07/2020   Procedure: BILATERAL BREAST RECONSTRUCTION WITH PLACEMENT OF TISSUE EXPANDER AND FLEX HD (ACELLULAR HYDRATED DERMIS);  Surgeon: [name], MD;  Location: [place];  Service: Plastics;  Laterality: Bilateral;   COSMETIC SURGERY     DILATION AND CURETTAGE OF UTERUS     HEMORRHOID SURGERY     IR IMAGING GUIDED PORT INSERTION  06/15/2020   PORT-A-CATH REMOVAL Right 11/07/2020   Procedure: REMOVAL  PORT-A-CATH;  Surgeon: [name], MD;  Location: [place];  Service: General;  Laterality: Right;   TOTAL MASTECTOMY Bilateral 11/07/2020   Procedure: BILATERAL MASTECTOMY;  Surgeon: [name], MD;  Location: [place];  Service: General;  Laterality: Bilateral;    There were no vitals filed for this visit.   Subjective Assessment - 12/20/20 0817     Subjective I've had a scab at the Lt mastectomy incision for about 4-5 days now with no leaking. I've just started using my Lt arm a little more over the weekend but I've been nervous about moving it much.    Pertinent History Patient was diagnosed on 05/10/2020 with left grade III invasive ductal carcinoma breast cancer. She had a bilateral mastectomy and left sentinel node biopsy (1 negative node) on 11/07/2020 It is ER positive, PR negative, and HER2 positive with a Ki67 of 50%. She underwent plastic surgery to her left arm at age 29 when her arm was severely cut in a glass door.    Patient Stated Goals Get arm moving normally and pain under control. Unable to wipe bottom.    Currently in Pain? No/denies                               [facility] Adult PT Treatment/Exercise - 12/20/20 0001       Manual Therapy  Manual Lymphatic Drainage (MLD) Focused on MLD today to bilateral chest avoiding recently healed Lt mastectomy incision and not over either expander; short neck, superficial and deep abominals, bil inguinal nodes bil axillo-inguinal anastomosis    Passive ROM --                         PT Long Term Goals - 12/05/20 1404       PT LONG TERM GOAL #1   Title Patient will demonstrate she has regained full shoulder ROM and function post opeeratively compared to baselines.    Time 4    Period Weeks    Status On-going    Target Date 01/02/21      PT LONG TERM GOAL #2   Title Patient will increase bilateral shoulder active flexion to >/= 150 degrees for increased ease reaching overhead.    Time  4    Period Weeks    Status New    Target Date 01/02/21      PT LONG TERM GOAL #3   Title Patient will increase bilateral shoulder active abduction to >/= 140 degrees for increased ease reaching overhead.    Time 4    Period Weeks    Status New    Target Date 01/02/21      PT LONG TERM GOAL #4   Title Patient will decrease her DASH score to </= 38.64 to achieve baseline arm function.    Baseline 54.55    Time 4    Period Weeks    Status New    Target Date 01/02/21      PT LONG TERM GOAL #5   Title Patient will verbalize good understanding of lympehdema risk reduction practices.    Baseline No knowledge    Time 4    Period Weeks    Status New    Target Date 01/02/21      Additional Long Term Goals   Additional Long Term Goals Yes      PT LONG TERM GOAL #6   Title Patient will report she has decreased pain by >/= 50% with daily tasks to reduce need for medication and tolerate normal ADLs.    Baseline Pain currently varies from 5-10/10 with most daily tasks.    Time 4    Period Weeks    Status New    Target Date 01/02/21                   Plan - 12/20/20 1055     Clinical Impression Statement Continued with focus on MLD to bil chest, focusing on lateal aspects where more fullness palpable. Pt sees Dr. [name] tomorrow and she plans to further discuss if anything can be done with the increased fullness at bil lateral chest, especially Rt side. Continued avoiding Lt mastectomy incision where still healing and recently scabbed over.    Stability/Clinical Decision Making Stable/Uncomplicated    Rehab Potential Excellent    PT Frequency 2x / week    PT Duration 4 weeks    PT Treatment/Interventions ADLs/Self Care Home Management;Therapeutic exercise;Patient/family education;Manual techniques;Passive range of motion;Scar mobilization    PT Next Visit Plan How was appt with Dr. [name]? Was she able to get a fill? Continue MLD to bil  chest at least until effectiveness is  determined. monitor left chest incision and avoid stretching skin here . Cont  AAROM, PROM, AROM, STM prn    PT Home Exercise Plan Post op shoulder ROM  HEP    Consulted and Agree with Plan of Care Patient             Patient will benefit from skilled therapeutic intervention in order to improve the following deficits and impairments:  Postural dysfunction, Decreased range of motion, Decreased knowledge of precautions, Impaired UE functional use, Pain, Decreased scar mobility  Visit Diagnosis: Aftercare following surgery for neoplasm  Stiffness of left shoulder, not elsewhere classified  Stiffness of right shoulder, not elsewhere classified  Abnormal posture  Malignant neoplasm of upper-outer quadrant of left breast in female, estrogen receptor positive ([facility])  Pain in left upper arm  Pain in right upper arm     Problem List Patient Active Problem List   Diagnosis Date Noted   Peripheral neuropathy due to chemotherapy ([place]) 10/20/2020   Morbid obesity with BMI of 40.0-44.9, adult ([place]) 07/19/2020   Port-A-Cath in place 07/07/2020   BRCA1 gene mutation positive 07/04/2020   Genetic testing 06/28/2020   Family history of non-Hodgkin's lymphoma 06/01/2020   Malignant neoplasm of upper-outer quadrant of left breast in female, estrogen receptor positive ([place]) 05/26/2020    Otelia Limes, PTA 12/20/2020, 11:04 AM  [place], [place], [address] Phone: [phone]   Fax:  [phone]  Name: Sheryl Mcmillan MRN: [id number] Date of Birth: [birth year]

## 2020-12-21 ENCOUNTER — Ambulatory Visit (INDEPENDENT_AMBULATORY_CARE_PROVIDER_SITE_OTHER): Payer: Medicaid Other | Admitting: Plastic Surgery

## 2020-12-21 DIAGNOSIS — Z719 Counseling, unspecified: Secondary | ICD-10-CM

## 2020-12-21 DIAGNOSIS — C50412 Malignant neoplasm of upper-outer quadrant of left female breast: Secondary | ICD-10-CM

## 2020-12-21 DIAGNOSIS — Z170 Estrogen receptor positive status [ER+]: Secondary | ICD-10-CM

## 2020-12-21 NOTE — Progress Notes (Signed)
Patient presents after breast reconstruction with tissue expanders.  We have been holding off on doing any fills as she has a small corner of her incision on the left side its been slow to heal.  She is feels fine and has not noted any drainage or other symptoms.  She still has a small scab on the corner of her mastectomy flap on the left but this seems to be coming along okay.  There should be tissue underneath that as I stairstep the closure along the inferior limb.  We decided to go ahead and start the filling process today.  She did have 200 cc of air in her expanders which I put in at the time of her surgery.  This was removed from both sides and 250 cc of saline was infiltrated in both expanders.  She tolerated this fine.  We will see her again in 2 weeks and continue the process.

## 2020-12-22 ENCOUNTER — Other Ambulatory Visit: Payer: Medicaid Other

## 2020-12-22 ENCOUNTER — Ambulatory Visit: Payer: Medicaid Other

## 2020-12-22 ENCOUNTER — Ambulatory Visit: Payer: Medicaid Other | Admitting: Physical Therapy

## 2020-12-22 ENCOUNTER — Encounter: Payer: Self-pay | Admitting: *Deleted

## 2020-12-22 ENCOUNTER — Other Ambulatory Visit: Payer: Self-pay

## 2020-12-22 ENCOUNTER — Encounter: Payer: Self-pay | Admitting: Physical Therapy

## 2020-12-22 DIAGNOSIS — C50412 Malignant neoplasm of upper-outer quadrant of left female breast: Secondary | ICD-10-CM | POA: Diagnosis not present

## 2020-12-22 DIAGNOSIS — M25612 Stiffness of left shoulder, not elsewhere classified: Secondary | ICD-10-CM

## 2020-12-22 DIAGNOSIS — Z483 Aftercare following surgery for neoplasm: Secondary | ICD-10-CM

## 2020-12-22 DIAGNOSIS — M25611 Stiffness of right shoulder, not elsewhere classified: Secondary | ICD-10-CM

## 2020-12-22 DIAGNOSIS — M79621 Pain in right upper arm: Secondary | ICD-10-CM

## 2020-12-22 DIAGNOSIS — M79622 Pain in left upper arm: Secondary | ICD-10-CM

## 2020-12-22 DIAGNOSIS — R293 Abnormal posture: Secondary | ICD-10-CM

## 2020-12-22 NOTE — Therapy (Signed)
[place], [place], [address] Phone: [phone]   Fax:  [phone]  Physical Therapy Treatment  Patient Details  Name: Sheryl Mcmillan MRN: [id number] Date of Birth: Aug 29, 1975 Referring Provider (PT): Dr. [name]   Encounter Date: 12/22/2020   PT End of Session - 12/22/20 1548     Visit Number 7    Number of Visits 10    Date for PT Re-Evaluation 01/02/21    Authorization Type [employer] Medicaid - automatically authorized for 12 visits; will need to seek approval if you need more than 12.    PT Start Time 1405    PT Stop Time 1455    PT Time Calculation (min) 50 min    Activity Tolerance Patient tolerated treatment well;Treatment limited secondary to medical complications (Comment)    Behavior During Therapy [facility] for tasks assessed/performed             Past Medical History:  Diagnosis Date   Arthritis    Breast cancer ([place])    Family history of non-Hodgkin's lymphoma 06/01/2020   Hemorrhoids    Migraines    Obesity    PCOS (polycystic ovarian syndrome)     Past Surgical History:  Procedure Laterality Date   AXILLARY SENTINEL NODE BIOPSY Left 11/07/2020   Procedure: LEFT AXILLARY SENTINEL NODE BIOPSY;  Surgeon: [name], MD;  Location: [place];  Service: General;  Laterality: Left;   BREAST RECONSTRUCTION WITH PLACEMENT OF TISSUE EXPANDER AND FLEX HD (ACELLULAR HYDRATED DERMIS) Bilateral 11/07/2020   Procedure: BILATERAL BREAST RECONSTRUCTION WITH PLACEMENT OF TISSUE EXPANDER AND FLEX HD (ACELLULAR HYDRATED DERMIS);  Surgeon: [name], MD;  Location: [place];  Service: Plastics;  Laterality: Bilateral;   COSMETIC SURGERY     DILATION AND CURETTAGE OF UTERUS     HEMORRHOID SURGERY     IR IMAGING GUIDED PORT INSERTION  06/15/2020   PORT-A-CATH REMOVAL Right 11/07/2020   Procedure: REMOVAL PORT-A-CATH;  Surgeon: [name], MD;  Location: [place];  Service:  General;  Laterality: Right;   TOTAL MASTECTOMY Bilateral 11/07/2020   Procedure: BILATERAL MASTECTOMY;  Surgeon: [name], MD;  Location: [place];  Service: General;  Laterality: Bilateral;    There were no vitals filed for this visit.                      [place] Adult PT Treatment/Exercise - 12/22/20 0001       Self-Care   Self-Care Other Self-Care Comments    Other Self-Care Comments  pt is still wearing her ace wrap with compression pad and is having difficulty with it. Measured her for the [employer] and she is interested in [employer].  She would like it in black and would need a size 2xl with 2 extenders      Exercises   Exercises Other Exercises    Other Exercises  in sitting, trunk rotation and side bending , then one foot on mat with rotation and side bending to facilitate range of motion for personal care, then cat/cow in sitting with wide legs for hip and low back stretch      Shoulder Exercises: Seated   Flexion AROM;Right;10 reps    Flexion Limitations added extra reaching for scapular range of motion and trunk rotation    Abduction AROM;Right      Manual Therapy   Manual Lymphatic Drainage (MLD) in sitting for upper chest, shoulders, and lateral chest and trunk  PT Long Term Goals - 12/05/20 1404       PT LONG TERM GOAL #1   Title Patient will demonstrate she has regained full shoulder ROM and function post opeeratively compared to baselines.    Time 4    Period Weeks    Status On-going    Target Date 01/02/21      PT LONG TERM GOAL #2   Title Patient will increase bilateral shoulder active flexion to >/= 150 degrees for increased ease reaching overhead.    Time 4    Period Weeks    Status New    Target Date 01/02/21      PT LONG TERM GOAL #3   Title Patient will increase bilateral shoulder active abduction to >/= 140 degrees for increased ease reaching overhead.    Time 4    Period  Weeks    Status New    Target Date 01/02/21      PT LONG TERM GOAL #4   Title Patient will decrease her DASH score to </= 38.64 to achieve baseline arm function.    Baseline 54.55    Time 4    Period Weeks    Status New    Target Date 01/02/21      PT LONG TERM GOAL #5   Title Patient will verbalize good understanding of lympehdema risk reduction practices.    Baseline No knowledge    Time 4    Period Weeks    Status New    Target Date 01/02/21      Additional Long Term Goals   Additional Long Term Goals Yes      PT LONG TERM GOAL #6   Title Patient will report she has decreased pain by >/= 50% with daily tasks to reduce need for medication and tolerate normal ADLs.    Baseline Pain currently varies from 5-10/10 with most daily tasks.    Time 4    Period Weeks    Status New    Target Date 01/02/21                   Plan - 12/22/20 1548     Clinical Impression Statement Pt continues to have fullness at lateral sides of trunk that seems persistent despite MLD and attempts at compression wtih ace wrap.  Feel she will do better with a compression bra. She allowed demographics to be sent to [facility] Pt did well withe trunk and hip strecthed today that were done monitoring the pulling on her left chest. She hopes to be able to perform personal hygiene independently soon.    Stability/Clinical Decision Making Stable/Uncomplicated    Rehab Potential Excellent    PT Frequency 2x / week    PT Duration 4 weeks    PT Treatment/Interventions ADLs/Self Care Home Management;Therapeutic exercise;Patient/family education;Manual techniques;Passive range of motion;Scar mobilization    PT Next Visit Plan give script to [facility] to get compression bra if we have not heard back from [facility] Continue MLD to bil  chest at least until effectiveness is determined. monitor left chest incision and avoid stretching skin here . Cont  AAROM, PROM, AROM, STM prn    Consulted and Agree  with Plan of Care Patient             Patient will benefit from skilled therapeutic intervention in order to improve the following deficits and impairments:  Postural dysfunction, Decreased range of motion, Decreased knowledge of precautions, Impaired UE functional  use, Pain, Decreased scar mobility  Visit Diagnosis: Aftercare following surgery for neoplasm  Stiffness of left shoulder, not elsewhere classified  Stiffness of right shoulder, not elsewhere classified  Abnormal posture  Pain in right upper arm  Pain in left upper arm     Problem List Patient Active Problem List   Diagnosis Date Noted   Peripheral neuropathy due to chemotherapy ([place]) 10/20/2020   Morbid obesity with BMI of 40.0-44.9, adult ([place]) 07/19/2020   Port-A-Cath in place 07/07/2020   BRCA1 gene mutation positive 07/04/2020   Genetic testing 06/28/2020   Family history of non-Hodgkin's lymphoma 06/01/2020   Malignant neoplasm of upper-outer quadrant of left breast in female, estrogen receptor positive ([place]) 05/26/2020   Donato Heinz. Owens Shark PT  Norwood Levo 12/22/2020, 4:01 PM  [place], [place], [address] Phone: [phone]   Fax:  [phone]  Name: Sheryl Mcmillan MRN: [id number] Date of Birth: 07/01/1975

## 2020-12-28 ENCOUNTER — Other Ambulatory Visit: Payer: Self-pay

## 2020-12-28 ENCOUNTER — Ambulatory Visit: Payer: Medicaid Other

## 2020-12-28 DIAGNOSIS — Z5111 Encounter for antineoplastic chemotherapy: Secondary | ICD-10-CM | POA: Diagnosis present

## 2020-12-28 DIAGNOSIS — M79621 Pain in right upper arm: Secondary | ICD-10-CM | POA: Insufficient documentation

## 2020-12-28 DIAGNOSIS — Z170 Estrogen receptor positive status [ER+]: Secondary | ICD-10-CM

## 2020-12-28 DIAGNOSIS — Z79899 Other long term (current) drug therapy: Secondary | ICD-10-CM | POA: Diagnosis not present

## 2020-12-28 DIAGNOSIS — M25611 Stiffness of right shoulder, not elsewhere classified: Secondary | ICD-10-CM | POA: Insufficient documentation

## 2020-12-28 DIAGNOSIS — R293 Abnormal posture: Secondary | ICD-10-CM

## 2020-12-28 DIAGNOSIS — Z483 Aftercare following surgery for neoplasm: Secondary | ICD-10-CM

## 2020-12-28 DIAGNOSIS — M25612 Stiffness of left shoulder, not elsewhere classified: Secondary | ICD-10-CM | POA: Insufficient documentation

## 2020-12-28 DIAGNOSIS — M79622 Pain in left upper arm: Secondary | ICD-10-CM | POA: Insufficient documentation

## 2020-12-28 DIAGNOSIS — C50412 Malignant neoplasm of upper-outer quadrant of left female breast: Secondary | ICD-10-CM | POA: Insufficient documentation

## 2020-12-28 NOTE — Therapy (Signed)
[place], [place], [address] Phone: [phone]   Fax:  [phone]  Physical Therapy Treatment  Patient Details  Name: Sheryl Mcmillan MRN: [id number] Date of Birth: Jul 27, 1975 Referring Provider (PT): Dr. [name]   Encounter Date: 12/28/2020   PT End of Session - 12/28/20 1103     Visit Number 8    Number of Visits 10    Date for PT Re-Evaluation 01/02/21    Authorization Type [employer] - automatically authorized for 12 visits; will need to seek approval if you need more than 12.    Authorization - Visit Number 7    Authorization - Number of Visits 12    PT Start Time 9390    PT Stop Time 1103    PT Time Calculation (min) 57 min    Activity Tolerance Patient tolerated treatment well;Treatment limited secondary to medical complications (Comment)    Behavior During Therapy [facility] for tasks assessed/performed             Past Medical History:  Diagnosis Date   Arthritis    Breast cancer ([place])    Family history of non-Hodgkin's lymphoma 06/01/2020   Hemorrhoids    Migraines    Obesity    PCOS (polycystic ovarian syndrome)     Past Surgical History:  Procedure Laterality Date   AXILLARY SENTINEL NODE BIOPSY Left 11/07/2020   Procedure: LEFT AXILLARY SENTINEL NODE BIOPSY;  Surgeon: [name], MD;  Location: [place];  Service: General;  Laterality: Left;   BREAST RECONSTRUCTION WITH PLACEMENT OF TISSUE EXPANDER AND FLEX HD (ACELLULAR HYDRATED DERMIS) Bilateral 11/07/2020   Procedure: BILATERAL BREAST RECONSTRUCTION WITH PLACEMENT OF TISSUE EXPANDER AND FLEX HD (ACELLULAR HYDRATED DERMIS);  Surgeon: [name], MD;  Location: [place];  Service: Plastics;  Laterality: Bilateral;   COSMETIC SURGERY     DILATION AND CURETTAGE OF UTERUS     HEMORRHOID SURGERY     IR IMAGING GUIDED PORT INSERTION  06/15/2020   PORT-A-CATH REMOVAL Right 11/07/2020   Procedure: REMOVAL  PORT-A-CATH;  Surgeon: [name], MD;  Location: [place];  Service: General;  Laterality: Right;   TOTAL MASTECTOMY Bilateral 11/07/2020   Procedure: BILATERAL MASTECTOMY;  Surgeon: [name], MD;  Location: [place];  Service: General;  Laterality: Bilateral;    There were no vitals filed for this visit.   Subjective Assessment - 12/28/20 1012     Subjective Dr. [name] was able to put 250 cc's in about 2 weeks ago and he wanted to wait for about 2 1/2 weeks after that to give the scab more time to heal but it is still slow healing. He also said he can remove the extra tissue at my sides when we do the implant exchange. My Rt trunk and shoulder are feeling looser now. Dr. [name] does think that some of my Rt trunk swelling is fluid. My Lt side is feeling tighter but I am still having to be careful on that side due to the incision that is still healing. I am able to independently perfomr all my self care tasks now so that's great.    Pertinent History Patient was diagnosed on 05/10/2020 with left grade III invasive ductal carcinoma breast cancer. She had a bilateral mastectomy and left sentinel node biopsy (1 negative node) on 11/07/2020 It is ER positive, PR negative, and HER2 positive with a Ki67 of 50%. She underwent plastic surgery to her left arm at age 89 when  her arm was severely cut in a glass door.    Patient Stated Goals Get arm moving normally and pain under control. Unable to wipe bottom.    Currently in Pain? No/denies                               [facility] Adult PT Treatment/Exercise - 12/28/20 0001       Shoulder Exercises: Pulleys   Flexion 2 minutes    Flexion Limitations Pt returned therapist demo, struggled with bil scapular compensations    ABduction 2 minutes    ABduction Limitations Pt returned therapist demo; conts to struggle with bil scapular compensations despite tactile and VCs      Shoulder Exercises: Therapy Ball   Flexion Both;10 reps    forward lean into end of stretch ensuring no pull at Lt mastectomy incision     Manual Therapy   Manual Lymphatic Drainage (MLD) In Supine with HOB elevated: Short neck, 5 diaphragmatic breaths, bil inguinal nodes, bil axillo-inguinal anastomosis focsuing on bil lateral trunk areas of fullness and being mindful of still healing incision at inferior Lt mastectomy incision.    Passive ROM In Supine to bil shoulders into flexion and abduction to tolerance and ensuring no pull at Lt mastectomy incision                         PT Long Term Goals - 12/05/20 1404       PT LONG TERM GOAL #1   Title Patient will demonstrate she has regained full shoulder ROM and function post opeeratively compared to baselines.    Time 4    Period Weeks    Status On-going    Target Date 01/02/21      PT LONG TERM GOAL #2   Title Patient will increase bilateral shoulder active flexion to >/= 150 degrees for increased ease reaching overhead.    Time 4    Period Weeks    Status New    Target Date 01/02/21      PT LONG TERM GOAL #3   Title Patient will increase bilateral shoulder active abduction to >/= 140 degrees for increased ease reaching overhead.    Time 4    Period Weeks    Status New    Target Date 01/02/21      PT LONG TERM GOAL #4   Title Patient will decrease her DASH score to </= 38.64 to achieve baseline arm function.    Baseline 54.55    Time 4    Period Weeks    Status New    Target Date 01/02/21      PT LONG TERM GOAL #5   Title Patient will verbalize good understanding of lympehdema risk reduction practices.    Baseline No knowledge    Time 4    Period Weeks    Status New    Target Date 01/02/21      Additional Long Term Goals   Additional Long Term Goals Yes      PT LONG TERM GOAL #6   Title Patient will report she has decreased pain by >/= 50% with daily tasks to reduce need for medication and tolerate normal ADLs.    Baseline Pain currently varies from  5-10/10 with most daily tasks.    Time 4    Period Weeks    Status New    Target Date 01/02/21  Plan - 12/28/20 1104     Clinical Impression Statement Pts Lt mastectomy incision is sitll scabbed ober but pt reports noticing some drainage occassionally still. So was careful to avoid any pull at incision her today with stretching. Continued with MLD to bil lateral trunk areas and added AA/ROM for bil shoulders. Pt struggles with decreasing scapular compensations so will benefit from further instruction of this.    Stability/Clinical Decision Making Stable/Uncomplicated    Rehab Potential Excellent    PT Duration 4 weeks    PT Treatment/Interventions ADLs/Self Care Home Management;Therapeutic exercise;Patient/family education;Manual techniques;Passive range of motion;Scar mobilization    PT Next Visit Plan give script to go [facility] to get compression bra if we have not heard back from [facility] Continue MLD to bil  chest at least until effectiveness is determined. monitor left chest incision and avoid stretching skin here . Cont  AAROM, PROM, AROM, STM prn    PT Home Exercise Plan Post op shoulder ROM HEP    Consulted and Agree with Plan of Care Patient             Patient will benefit from skilled therapeutic intervention in order to improve the following deficits and impairments:  Postural dysfunction, Decreased range of motion, Decreased knowledge of precautions, Impaired UE functional use, Pain, Decreased scar mobility  Visit Diagnosis: Aftercare following surgery for neoplasm  Stiffness of left shoulder, not elsewhere classified  Stiffness of right shoulder, not elsewhere classified  Abnormal posture  Pain in right upper arm  Pain in left upper arm  Malignant neoplasm of upper-outer quadrant of left breast in female, estrogen receptor positive ([place])     Problem List Patient Active Problem List   Diagnosis Date Noted   Peripheral  neuropathy due to chemotherapy ([place]) 10/20/2020   Morbid obesity with BMI of 40.0-44.9, adult ([place]) 07/19/2020   Port-A-Cath in place 07/07/2020   BRCA1 gene mutation positive 07/04/2020   Genetic testing 06/28/2020   Family history of non-Hodgkin's lymphoma 06/01/2020   Malignant neoplasm of upper-outer quadrant of left breast in female, estrogen receptor positive ([place]) 05/26/2020    Otelia Limes, PTA 12/28/2020, 12:28 PM  [place], [place], [address] Phone: [phone]   Fax:  [phone]  Name: Sheryl Mcmillan MRN: [id number] Date of Birth: [birth year]

## 2020-12-29 ENCOUNTER — Telehealth: Payer: Self-pay | Admitting: Oncology

## 2020-12-29 ENCOUNTER — Ambulatory Visit: Payer: Medicaid Other

## 2020-12-29 DIAGNOSIS — R293 Abnormal posture: Secondary | ICD-10-CM

## 2020-12-29 DIAGNOSIS — Z170 Estrogen receptor positive status [ER+]: Secondary | ICD-10-CM

## 2020-12-29 DIAGNOSIS — M79621 Pain in right upper arm: Secondary | ICD-10-CM

## 2020-12-29 DIAGNOSIS — Z483 Aftercare following surgery for neoplasm: Secondary | ICD-10-CM

## 2020-12-29 DIAGNOSIS — M25612 Stiffness of left shoulder, not elsewhere classified: Secondary | ICD-10-CM

## 2020-12-29 DIAGNOSIS — M79622 Pain in left upper arm: Secondary | ICD-10-CM

## 2020-12-29 DIAGNOSIS — Z5111 Encounter for antineoplastic chemotherapy: Secondary | ICD-10-CM | POA: Diagnosis not present

## 2020-12-29 DIAGNOSIS — M25611 Stiffness of right shoulder, not elsewhere classified: Secondary | ICD-10-CM

## 2020-12-29 DIAGNOSIS — C50412 Malignant neoplasm of upper-outer quadrant of left female breast: Secondary | ICD-10-CM

## 2020-12-29 NOTE — Therapy (Signed)
[place], [place], [address] Phone: [phone]   Fax:  [phone]  Physical Therapy Treatment  Patient Details  Name: Sheryl Mcmillan MRN: [id number] Date of Birth: 22-Oct-1975 Referring Provider (PT): Dr. [name]   Encounter Date: 12/29/2020   PT End of Session - 12/29/20 1019     Visit Number 9    Number of Visits 10    Date for PT Re-Evaluation 01/02/21    Authorization Type [employer] Medicaid - automatically authorized for 12 visits; will need to seek approval if you need more than 12.    Authorization - Visit Number 8    Authorization - Number of Visits 12    PT Start Time 1005    PT Stop Time 1102    PT Time Calculation (min) 57 min    Activity Tolerance Patient tolerated treatment well;Treatment limited secondary to medical complications (Comment)    Behavior During Therapy [facility] for tasks assessed/performed             Past Medical History:  Diagnosis Date   Arthritis    Breast cancer ([place])    Family history of non-Hodgkin's lymphoma 06/01/2020   Hemorrhoids    Migraines    Obesity    PCOS (polycystic ovarian syndrome)     Past Surgical History:  Procedure Laterality Date   AXILLARY SENTINEL NODE BIOPSY Left 11/07/2020   Procedure: LEFT AXILLARY SENTINEL NODE BIOPSY;  Surgeon: [name], MD;  Location: [place];  Service: General;  Laterality: Left;   BREAST RECONSTRUCTION WITH PLACEMENT OF TISSUE EXPANDER AND FLEX HD (ACELLULAR HYDRATED DERMIS) Bilateral 11/07/2020   Procedure: BILATERAL BREAST RECONSTRUCTION WITH PLACEMENT OF TISSUE EXPANDER AND FLEX HD (ACELLULAR HYDRATED DERMIS);  Surgeon: [name], MD;  Location: [place];  Service: Plastics;  Laterality: Bilateral;   COSMETIC SURGERY     DILATION AND CURETTAGE OF UTERUS     HEMORRHOID SURGERY     IR IMAGING GUIDED PORT INSERTION  06/15/2020   PORT-A-CATH REMOVAL Right 11/07/2020   Procedure: REMOVAL  PORT-A-CATH;  Surgeon: [name], MD;  Location: [place];  Service: General;  Laterality: Right;   TOTAL MASTECTOMY Bilateral 11/07/2020   Procedure: BILATERAL MASTECTOMY;  Surgeon: [name], MD;  Location: [place];  Service: General;  Laterality: Bilateral;    There were no vitals filed for this visit.   Subjective Assessment - 12/29/20 1017     Subjective Nothing new since I was here yesterday.    Pertinent History Patient was diagnosed on 05/10/2020 with left grade III invasive ductal carcinoma breast cancer. She had a bilateral mastectomy and left sentinel node biopsy (1 negative node) on 11/07/2020 It is ER positive, PR negative, and HER2 positive with a Ki67 of 50%. She underwent plastic surgery to her left arm at age 45 when her arm was severely cut in a glass door.    Patient Stated Goals Get arm moving normally and pain under control. Unable to wipe bottom.    Currently in Pain? No/denies                               [facility] Adult PT Treatment/Exercise - 12/29/20 0001       Shoulder Exercises: Pulleys   Flexion 2 minutes    Flexion Limitations Pt able to start demonstrating mild scapular depression today with flex and abd    ABduction 2 minutes  Shoulder Exercises: Therapy Ball   Flexion Both;10 reps   forward lean into end of stretch     Manual Therapy   Soft tissue mobilization Gently over Lt pectoralis near insertion where palpably tight at end P/ROM    Manual Lymphatic Drainage (MLD) In Supine with HOB elevated: Short neck, 5 diaphragmatic breaths, bil inguinal nodes, bil axillo-inguinal anastomosis focsuing on bil lateral trunk areas of fullness and being mindful of still healing incision at inferior Lt mastectomy incision.    Passive ROM In Supine to bil shoulders into flexion and abduction to tolerance and ensuring no pull at Lt mastectomy incision                         PT Long Term Goals - 12/05/20 1404        PT LONG TERM GOAL #1   Title Patient will demonstrate she has regained full shoulder ROM and function post opeeratively compared to baselines.    Time 4    Period Weeks    Status On-going    Target Date 01/02/21      PT LONG TERM GOAL #2   Title Patient will increase bilateral shoulder active flexion to >/= 150 degrees for increased ease reaching overhead.    Time 4    Period Weeks    Status New    Target Date 01/02/21      PT LONG TERM GOAL #3   Title Patient will increase bilateral shoulder active abduction to >/= 140 degrees for increased ease reaching overhead.    Time 4    Period Weeks    Status New    Target Date 01/02/21      PT LONG TERM GOAL #4   Title Patient will decrease her DASH score to </= 38.64 to achieve baseline arm function.    Baseline 54.55    Time 4    Period Weeks    Status New    Target Date 01/02/21      PT LONG TERM GOAL #5   Title Patient will verbalize good understanding of lympehdema risk reduction practices.    Baseline No knowledge    Time 4    Period Weeks    Status New    Target Date 01/02/21      Additional Long Term Goals   Additional Long Term Goals Yes      PT LONG TERM GOAL #6   Title Patient will report she has decreased pain by >/= 50% with daily tasks to reduce need for medication and tolerate normal ADLs.    Baseline Pain currently varies from 5-10/10 with most daily tasks.    Time 4    Period Weeks    Status New    Target Date 01/02/21                   Plan - 12/29/20 1019     Clinical Impression Statement Lt mastectomy incision appears drier around perimeter of scab today so continued avoiding any pull at incision. Script issued to pt for compression bra and instructed to call [facility] for an appt. Continued with manual therapy working to decrease edema at bil lateral trunks areas and gentle Lt shoulder AA/P/ROM.    Stability/Clinical Decision Making Stable/Uncomplicated    Rehab Potential Excellent     PT Frequency 2x / week    PT Duration 4 weeks    PT Treatment/Interventions ADLs/Self Care Home Management;Therapeutic exercise;Patient/family education;Manual techniques;Passive  range of motion;Scar mobilization    PT Next Visit Plan Continue MLD to bil  chest at least until effectiveness is determined. monitor left chest incision and avoid stretching skin here . Cont  AAROM, PROM, AROM, STM prn    PT Home Exercise Plan Post op shoulder ROM HEP    Consulted and Agree with Plan of Care Patient             Patient will benefit from skilled therapeutic intervention in order to improve the following deficits and impairments:  Postural dysfunction, Decreased range of motion, Decreased knowledge of precautions, Impaired UE functional use, Pain, Decreased scar mobility  Visit Diagnosis: Aftercare following surgery for neoplasm  Stiffness of left shoulder, not elsewhere classified  Stiffness of right shoulder, not elsewhere classified  Abnormal posture  Pain in right upper arm  Pain in left upper arm  Malignant neoplasm of upper-outer quadrant of left breast in female, estrogen receptor positive ([place])     Problem List Patient Active Problem List   Diagnosis Date Noted   Peripheral neuropathy due to chemotherapy ([place]) 10/20/2020   Morbid obesity with BMI of 40.0-44.9, adult ([place]) 07/19/2020   Port-A-Cath in place 07/07/2020   BRCA1 gene mutation positive 07/04/2020   Genetic testing 06/28/2020   Family history of non-Hodgkin's lymphoma 06/01/2020   Malignant neoplasm of upper-outer quadrant of left breast in female, estrogen receptor positive ([place]) 05/26/2020    Otelia Limes, PTA 12/29/2020, 11:00 AM  [place], [place], [address] Phone: [phone]   Fax:  [phone]  Name: DARWIN GUASTELLA MRN: [id number] Date of Birth: 09-Aug-1975

## 2020-12-29 NOTE — Telephone Encounter (Signed)
R/s appts per 7/7 sch msg. Pt aware.  

## 2021-01-02 ENCOUNTER — Encounter: Payer: Self-pay | Admitting: Oncology

## 2021-01-02 ENCOUNTER — Other Ambulatory Visit: Payer: Self-pay | Admitting: Oncology

## 2021-01-02 ENCOUNTER — Other Ambulatory Visit (HOSPITAL_COMMUNITY): Payer: Medicaid Other

## 2021-01-02 NOTE — Progress Notes (Signed)
Sheryl Mcmillan  [phone] Fax:([phone]) [phone]     ID: Sheryl Mcmillan DOB: June 02, 1976  MR#: [id number]  ENI#:[id number]  Patient Care Team: Sheryl Hummingbird, PA-C as PCP - General (Physician Assistant) [name], RN as Oncology Nurse Navigator Sheryl Germany, RN as Oncology Nurse Navigator Sheryl Bookbinder, MD as Consulting Physician (General Surgery) Sheryl Mcmillan, Sheryl Dad, MD as Consulting Physician (Oncology) Sheryl Pray, MD as Consulting Physician (Radiation Oncology) Sheryl Presume, MD as Consulting Physician (Plastic Surgery) Sheryl Cruel, MD OTHER MD:  CHIEF COMPLAINT: estrogen receptor negative, Her2 positive breast cancer  CURRENT TREATMENT: Adjuvant immunotherapy   INTERVAL HISTORY: Sheryl Mcmillan returns today for follow up and treatment of her estrogen receptor negative, Her2 positive breast cancer accompanied by her daughter (who incidentally is BRCA negative)  Sheryl Mcmillan is now receiving trastuzumab alone, to complete a year.  Her port was removed and she is receiving this subcutaneously  Her most recent echocardiogram on 09/14/2020 showed an ejection fraction of 65-70%. This is stable from prior in 05/2020.  Since her last visit, she underwent bilateral mastectomies on 11/07/2020 under Dr. Donne Hazel. Pathology from the procedure [phone]) showed: Right Breast  - benign breast tissue 2. Left Breast  - invasive ductal carcinoma, 1.5 cm, grade 3   - margins not involved 3. Left Axillary Lymph Node  - negative for metastatic carcinoma (0/1)  She also met with Dr. Denman George on 12/09/2020 to discuss risk-reducing hysterectomy with BSO. She hopes to also undergo cholecystectomy at the same time.   REVIEW OF SYSTEMS: Sheryl Mcmillan is very pleased with the bilateral mastectomies she underwent.  She is certainly healing well from them.  She has expanders in place and hopes to begin the inflation soon.  She denies any unusual pain fever rash or bleeding.   She has a normal functional status.  A detailed review of systems today was otherwise stable    COVID 19 VACCINATION STATUS: Status post vaccine x2, no booster as of February 2022   HISTORY OF CURRENT ILLNESS: From the original intake note:   Sheryl Mcmillan presented with a palpable lump in the outer left breast, which she initially felt approximately 6 months prior. She waited to seek medical attention because she had no insurance.  Eventually she enrolled in the [facility] program and underwent bilateral diagnostic mammography with tomography and left breast ultrasonography at [facility] on 05/10/2020 showing: breast density category B; vaguely palpable 2.5 cm mass in left breast at 1:30; no pathologic left axillary lymphadenopathy or evidence of right breast malignancy.  Accordingly on 05/18/2020 she proceeded to biopsy of the left breast area in question. The pathology from this procedure ([id number]) showed: invasive ductal carcinoma, grade 3. Prognostic indicators significant for: estrogen receptor, 40% positive with weak staining intensity and progesterone receptor, 0% negative. Proliferation marker Ki67 at 50%. HER2 positive by immunohistochemistry (3+).  The patient's subsequent history is as detailed below.   PAST MEDICAL HISTORY: Past Medical History:  Diagnosis Date   Arthritis    Breast cancer ([place])    Family history of non-Hodgkin's lymphoma 06/01/2020   Hemorrhoids    Migraines    Obesity    PCOS (polycystic ovarian syndrome)     PAST SURGICAL HISTORY: Past Surgical History:  Procedure Laterality Date   AXILLARY SENTINEL NODE BIOPSY Left 11/07/2020   Procedure: LEFT AXILLARY SENTINEL NODE BIOPSY;  Surgeon: Sheryl Bookbinder, MD;  Location: [facility];  Service: General;  Laterality: Left;   BREAST RECONSTRUCTION WITH PLACEMENT OF  TISSUE EXPANDER AND FLEX HD (ACELLULAR HYDRATED DERMIS) Bilateral 11/07/2020   Procedure: BILATERAL BREAST RECONSTRUCTION WITH PLACEMENT OF TISSUE  EXPANDER AND FLEX HD (ACELLULAR HYDRATED DERMIS);  Surgeon: Sheryl Presume, MD;  Location: [facility];  Service: Plastics;  Laterality: Bilateral;   COSMETIC SURGERY     DILATION AND CURETTAGE OF UTERUS     HEMORRHOID SURGERY     IR IMAGING GUIDED PORT INSERTION  06/15/2020   PORT-A-CATH REMOVAL Right 11/07/2020   Procedure: REMOVAL PORT-A-CATH;  Surgeon: Sheryl Bookbinder, MD;  Location: [facility];  Service: General;  Laterality: Right;   TOTAL MASTECTOMY Bilateral 11/07/2020   Procedure: BILATERAL MASTECTOMY;  Surgeon: Sheryl Bookbinder, MD;  Location: [facility];  Service: General;  Laterality: Bilateral;    FAMILY HISTORY: Family History  Problem Relation Age of Onset   Hypertension Mother    Diabetes Mother    Cancer Mother 40       unknown primary   Other Mother        brain tumor; dx 35s   Non-Hodgkin's lymphoma Brother 6   Other Maternal Uncle 49       brain tumor   Breast cancer Other        MGM's niece; dx late 57s   Pancreatic cancer Neg Hx    Colon cancer Neg Hx    Endometrial cancer Neg Hx    Prostate cancer Neg Hx    Ovarian cancer Neg Hx    She has no information on her father. Her mother died at age 39 from stage IV cancer. The primary cancer was unknown, but it was not breast.  Sheryl Mcmillan has two half brothers (through her mother), one of which was diagnosed with non-Hodgkin's lymphoma. There is no family history of breast, ovarian, or prostate cancer to her knowledge. // Patient is BRCA1 positive. Daughter tested negative.   GYNECOLOGIC HISTORY:  No LMP recorded. (Menstrual status: Chemotherapy). Menarche: 45 years old Age at first live birth: 45 years old [place] P 1 LMP 05/02/2020 Contraceptive: never used HRT n/a  Hysterectomy? no BSO? 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 She is single. Her significant other [name] is a Freight forwarder. She lives at home with [name], her daughter [name],   [name]'s daughter [name] and her 11-monthold daughter [name] Daughter [name] age [id number] is a bChief Operating Officerat [employer] in [place]tends [employer]    ADVANCED DIRECTIVES: Completed and notarized 09/07/2020.  She has named her aunt TMarveen Reeksas healthcare power of attorney   HEALTH MAINTENANCE: Social History   Tobacco Use   Smoking status: Former    Packs/day: 0.50    Years: 10.00    Pack years: 5.00    Types: Cigarettes    Quit date: 08/23/2020    Years since quitting: 0.3   Smokeless tobacco: Never  Vaping Use   Vaping Use: Never used  Substance Use Topics   Alcohol use: Yes    Comment: occasionally   Drug use: Yes    Types: Marijuana    Comment: 5-7 days/week     Colonoscopy: 2018  PAP: 2018  Bone density: n/a (age)   Allergies  Allergen Reactions   Carboplatin Hives and Itching    Carboplatin reaction after dose #5 requiring Benadryl, Solumedrol, epinephrine, Claritin.   Other Hives    Baking Soda   Wound Dressing Adhesive Hives    Rips skin  Current Outpatient Medications  Medication Sig Dispense Refill   Acetaminophen (TYLENOL ARTHRITIS PAIN PO) Take by mouth. As needed     Multiple Vitamins-Minerals (ONE-A-DAY WOMENS PO) Take 1 tablet by mouth daily.     oxyCODONE (OXY IR/ROXICODONE) 5 MG immediate release tablet Take 1 tablet (5 mg total) by mouth every 4 (four) hours as needed for severe pain. For AFTER surgery only, do not take and drive 15 tablet 0   senna-docusate (SENOKOT-S) 8.6-50 MG tablet Take 2 tablets by mouth at bedtime. For AFTER surgery only, do not take if having diarrhea 30 tablet 0   No current facility-administered medications for this visit.   Facility-Administered Medications Ordered in Other Visits  Medication Dose Route Frequency Provider Last Rate Last Admin   sodium chloride flush (NS) 0.9 % injection 10 mL  10 mL Intravenous PRN Causey, Sheryl Massed, NP        OBJECTIVE: White woman who appears  stated age There were no vitals filed for this visit.  There is no height or weight on file to calculate BMI. There were no vitals filed for this visit.  Sclerae unicteric, EOMs intact Wearing a mask No cervical or supraclavicular adenopathy Lungs no rales or rhonchi Heart regular rate and rhythm Abd soft, nontender, positive bowel sounds MSK no focal spinal tenderness, no upper extremity lymphedema Neuro: nonfocal, well oriented, appropriate affect Breasts: Status post bilateral mastectomies.  The incisions are healing nicely without dehiscence erythema or swelling.   LAB RESULTS:  CMP     Component Value Date/Time   NA 142 12/15/2020 0953   K 4.0 12/15/2020 0953   CL 109 12/15/2020 0953   CO2 21 (L) 12/15/2020 0953   GLUCOSE 120 (H) 12/15/2020 0953   BUN 14 12/15/2020 0953   CREATININE 0.79 12/15/2020 0953   CALCIUM 9.2 12/15/2020 0953   PROT 6.8 12/15/2020 0953   ALBUMIN 3.6 12/15/2020 0953   AST 12 (L) 12/15/2020 0953   ALT 16 12/15/2020 0953   ALKPHOS 75 12/15/2020 0953   BILITOT <0.2 (L) 12/15/2020 0953   GFRNONAA >60 12/15/2020 0953   GFRAA >60 06/06/2019 1205    No results found for: TOTALPROTELP, ALBUMINELP, A1GS, A2GS, BETS, BETA2SER, GAMS, MSPIKE, SPEI  Lab Results  Component Value Date   WBC 6.0 12/15/2020   NEUTROABS 3.1 12/15/2020   HGB 11.2 (L) 12/15/2020   HCT 34.6 (L) 12/15/2020   MCV 89.4 12/15/2020   PLT 280 12/15/2020    No results found for: LABCA2  No components found for: XMIWOE321  No results for input(s): INR in the last 168 hours.  No results found for: LABCA2  No results found for: CAN199  Lab Results  Component Value Date   CAN125 7.4 12/15/2020    No results found for: YYQ825  No results found for: CA2729  No components found for: HGQUANT  No results found for: CEA1 / No results found for: CEA1   No results found for: AFPTUMOR  No results found for: CHROMOGRNA  No results found for: KPAFRELGTCHN, LAMBDASER,  KAPLAMBRATIO (kappa/lambda light chains)  No results found for: HGBA, HGBA2QUANT, HGBFQUANT, HGBSQUAN (Hemoglobinopathy evaluation)   No results found for: LDH  No results found for: IRON, TIBC, IRONPCTSAT (Iron and TIBC)  No results found for: FERRITIN  Urinalysis    Component Value Date/Time   COLORURINE YELLOW 06/06/2019 [place] 06/06/2019 1205   LABSPEC >1.030 (H) 06/06/2019 1205   PHURINE 5.5 06/06/2019 [place] 06/06/2019  [place] (A) 06/06/2019 [place] 06/06/2019 1205   Rafael Gonzalez 06/06/2019 [place] 06/06/2019 1205   UROBILINOGEN 1.0 05/09/2011 1805   NITRITE NEGATIVE 06/06/2019 1205   LEUKOCYTESUR NEGATIVE 06/06/2019 1205    STUDIES: No results found.   ELIGIBLE FOR AVAILABLE RESEARCH PROTOCOL: AET  ASSESSMENT: 45 y.o. BRCA positive [place] woman status post left breast upper outer quadrant biopsy 05/18/2020 for a clinical T2N0, stage IIA invasive ductal carcinoma, grade 3, estrogen receptor weakly positive, progesterone receptor negative, with an MIB-1 of 50%, and HER-2 positive by immunohistochemistry  (1) genetics testing 06/16/2020 shows a pathogenic mutation in BRCA1 at c.2475del (p.Asp825Glufs*21)  (a)  Variant of uncertain significance in MSH3 at c.2724A>G (Silent).  (b) no additional deleterious mutations through the Multi-Cancer Panel offered by [facility] including AIP, ALK, APC, ATM, AXIN2,BAP1,  BARD1, BLM, BMPR1A, BRCA1, BRCA2, BRIP1, CASR, CDC73, CDH1, CDK4, CDKN1B, CDKN1C, CDKN2A (p14ARF), CDKN2A (p16INK4a), CEBPA, CHEK2, CTNNA1, DICER1, DIS3L2, EGFR (c.2369C>T, p.Thr790Met variant only), EPCAM (Deletion/duplication testing only), FH, FLCN, GATA2, GPC3, GREM1 (Promoter region deletion/duplication testing only), HOXB13 (c.251G>A, p.Gly84Glu), HRAS, KIT, MAX, MEN1, MET, MITF (c.952G>A, p.Glu318Lys variant only), MLH1, MSH2, MSH3, MSH6, MUTYH, NBN, NF1, NF2, NTHL1,  PALB2, PDGFRA, PHOX2B, PMS2, POLD1, POLE, POT1, PRKAR1A, PTCH1, PTEN, RAD50, RAD51C, RAD51D, RB1, RECQL4, RET, RNF43, RUNX1, SDHAF2, SDHA (sequence changes only), SDHB, SDHC, SDHD, SMAD4, SMARCA4, SMARCB1, SMARCE1, STK11, SUFU, TERC, TERT, TMEM127, TP53, TSC1, TSC2, VHL, WRN and WT1.   (2) neoadjuvant chemotherapy to consist of carboplatin and docetaxel together with trastuzumab and Pertuzumab every 3 weeks x 6, starting 06/16/2020, completed 09/28/2020   (a) pertuzumab omitted after cycle 1  (3) trastuzumab to be continued to complete 1 year (through December 2022)  (a) echo 06/09/2020 shows EF in the 60-65% range  (b) echo 09/14/2020 shows an ejection fraction in 65-70% range.  (4) status post bilateral mastectomies 11/07/2020 showing  (A) on the right, no evidence of malignancy  (B) on the left, residual yp T1c yp N0 invasive ductal carcinoma, grade 3, with negative margins, now triple negative, with an MIB-1 of 15%.  (5) bilateral salpingo-oophorectomy planned for August 2022  (6) olaparib to start September 2022   PLAN:  Kymberly did very well with her surgery and we are continuing anti-HER2 treatment to complete a year.  Given her residual disease we considered switching her to T-DM1.  However her port has been removed and she will is comfortable receiving the trastuzumab subcutaneously.  In addition the residual disease is now triple negative, which is something we do observe.  Accordingly we are continuing the trastuzumab and not switching at this point  She will be scheduled for bilateral salpingo-oophorectomy and possibly hysterectomy in August and she hopes to have a cholecystectomy at the same time.  She will see me in 6 weeks.  She knows to call for any other issue admittable before that visit  Total encounter time 25 minutes.   Sheryl Mcmillan. Diondra Pines, MD 01/02/21 12:14 PM Medical Oncology and Hematology [facility] [place], [place] [address] Tel.  [phone]    Fax. [phone]   I, Wilburn Mylar, am acting as scribe for Dr. Virgie Mcmillan. Amado Andal.  I, Lurline Del MD, have reviewed the above documentation for accuracy and completeness, and I agree with the above.   *Total Encounter Time as defined by the Centers for Medicare and Medicaid Services includes, in addition to the face-to-face time of a patient  visit (documented in the note above) non-face-to-face time: obtaining and reviewing outside history, ordering and reviewing medications, tests or procedures, care coordination (communications with other health care professionals or caregivers) and documentation in the medical record.

## 2021-01-04 ENCOUNTER — Encounter: Payer: Self-pay | Admitting: *Deleted

## 2021-01-04 ENCOUNTER — Ambulatory Visit (INDEPENDENT_AMBULATORY_CARE_PROVIDER_SITE_OTHER): Payer: Medicaid Other | Admitting: Plastic Surgery

## 2021-01-04 ENCOUNTER — Other Ambulatory Visit: Payer: Self-pay

## 2021-01-04 DIAGNOSIS — C50412 Malignant neoplasm of upper-outer quadrant of left female breast: Secondary | ICD-10-CM

## 2021-01-04 DIAGNOSIS — Z170 Estrogen receptor positive status [ER+]: Secondary | ICD-10-CM

## 2021-01-04 NOTE — Progress Notes (Signed)
Patient presents after breast reconstruction with tissue expanders.  At her last visit I removed all the air from her expanders and filled each side with 250 cc.  She did fine from that and the small scab that had been present at the T-junction on the left side has fallen off and looks to be doing fine.  Today 100 cc were put in both expanders for recurrent total volume of 350 cc.  The prescribed size for her expanders is 700 cc.  She tolerated this fine we will plan to see her next week to do an additional fill.

## 2021-01-05 ENCOUNTER — Inpatient Hospital Stay: Payer: Medicaid Other | Attending: Oncology

## 2021-01-05 ENCOUNTER — Ambulatory Visit: Payer: Medicaid Other

## 2021-01-05 ENCOUNTER — Inpatient Hospital Stay: Payer: Medicaid Other

## 2021-01-05 ENCOUNTER — Other Ambulatory Visit: Payer: Medicaid Other

## 2021-01-05 ENCOUNTER — Other Ambulatory Visit: Payer: Self-pay | Admitting: Oncology

## 2021-01-05 VITALS — BP 126/87 | HR 87 | Temp 98.1°F | Resp 18

## 2021-01-05 DIAGNOSIS — Z79899 Other long term (current) drug therapy: Secondary | ICD-10-CM | POA: Insufficient documentation

## 2021-01-05 DIAGNOSIS — R293 Abnormal posture: Secondary | ICD-10-CM

## 2021-01-05 DIAGNOSIS — Z170 Estrogen receptor positive status [ER+]: Secondary | ICD-10-CM

## 2021-01-05 DIAGNOSIS — C50412 Malignant neoplasm of upper-outer quadrant of left female breast: Secondary | ICD-10-CM

## 2021-01-05 DIAGNOSIS — M79622 Pain in left upper arm: Secondary | ICD-10-CM

## 2021-01-05 DIAGNOSIS — Z5111 Encounter for antineoplastic chemotherapy: Secondary | ICD-10-CM | POA: Insufficient documentation

## 2021-01-05 DIAGNOSIS — Z1509 Genetic susceptibility to other malignant neoplasm: Secondary | ICD-10-CM

## 2021-01-05 DIAGNOSIS — M79621 Pain in right upper arm: Secondary | ICD-10-CM

## 2021-01-05 DIAGNOSIS — Z483 Aftercare following surgery for neoplasm: Secondary | ICD-10-CM

## 2021-01-05 DIAGNOSIS — M25612 Stiffness of left shoulder, not elsewhere classified: Secondary | ICD-10-CM

## 2021-01-05 DIAGNOSIS — Z1501 Genetic susceptibility to malignant neoplasm of breast: Secondary | ICD-10-CM

## 2021-01-05 DIAGNOSIS — M25611 Stiffness of right shoulder, not elsewhere classified: Secondary | ICD-10-CM

## 2021-01-05 LAB — CBC WITH DIFFERENTIAL (CANCER CENTER ONLY)
Abs Immature Granulocytes: 0.02 10*3/uL (ref 0.00–0.07)
Basophils Absolute: 0 10*3/uL (ref 0.0–0.1)
Basophils Relative: 0 %
Eosinophils Absolute: 0.3 10*3/uL (ref 0.0–0.5)
Eosinophils Relative: 4 %
HCT: 36 % (ref 36.0–46.0)
Hemoglobin: 11.7 g/dL — ABNORMAL LOW (ref 12.0–15.0)
Immature Granulocytes: 0 %
Lymphocytes Relative: 35 %
Lymphs Abs: 2.8 10*3/uL (ref 0.7–4.0)
MCH: 27.7 pg (ref 26.0–34.0)
MCHC: 32.5 g/dL (ref 30.0–36.0)
MCV: 85.1 fL (ref 80.0–100.0)
Monocytes Absolute: 0.4 10*3/uL (ref 0.1–1.0)
Monocytes Relative: 5 %
Neutro Abs: 4.4 10*3/uL (ref 1.7–7.7)
Neutrophils Relative %: 56 %
Platelet Count: 294 10*3/uL (ref 150–400)
RBC: 4.23 MIL/uL (ref 3.87–5.11)
RDW: 13.8 % (ref 11.5–15.5)
WBC Count: 8 10*3/uL (ref 4.0–10.5)
nRBC: 0 % (ref 0.0–0.2)

## 2021-01-05 LAB — CMP (CANCER CENTER ONLY)
ALT: 20 U/L (ref 0–44)
AST: 15 U/L (ref 15–41)
Albumin: 3.5 g/dL (ref 3.5–5.0)
Alkaline Phosphatase: 85 U/L (ref 38–126)
Anion gap: 10 (ref 5–15)
BUN: 15 mg/dL (ref 6–20)
CO2: 22 mmol/L (ref 22–32)
Calcium: 8.9 mg/dL (ref 8.9–10.3)
Chloride: 107 mmol/L (ref 98–111)
Creatinine: 0.86 mg/dL (ref 0.44–1.00)
GFR, Estimated: >60 mL/min (ref >60)
Glucose, Bld: 157 mg/dL — ABNORMAL HIGH (ref 70–99)
Potassium: 4 mmol/L (ref 3.5–5.1)
Sodium: 139 mmol/L (ref 135–145)
Total Bilirubin: <0.2 mg/dL — ABNORMAL LOW (ref 0.3–1.2)
Total Protein: 6.9 g/dL (ref 6.5–8.1)

## 2021-01-05 MED ORDER — TRASTUZUMAB-HYALURONIDASE-OYSK 600-10000 MG-UNT/5ML ~~LOC~~ SOLN
600.0000 mg | Freq: Once | SUBCUTANEOUS | Status: AC
Start: 2021-01-05 — End: 2021-01-05
  Administered 2021-01-05: 600 mg via SUBCUTANEOUS
  Filled 2021-01-05: qty 5

## 2021-01-05 MED ORDER — ACETAMINOPHEN 325 MG PO TABS
ORAL_TABLET | ORAL | Status: AC
  Filled 2021-01-05: qty 2

## 2021-01-05 MED ORDER — DIPHENHYDRAMINE HCL 25 MG PO CAPS
ORAL_CAPSULE | ORAL | Status: AC
  Filled 2021-01-05: qty 1

## 2021-01-05 MED ORDER — ACETAMINOPHEN 325 MG PO TABS
650.0000 mg | ORAL_TABLET | Freq: Once | ORAL | Status: AC
  Administered 2021-01-05: 650 mg via ORAL

## 2021-01-05 MED ORDER — DIPHENHYDRAMINE HCL 25 MG PO CAPS
25.0000 mg | ORAL_CAPSULE | Freq: Once | ORAL | Status: AC
  Administered 2021-01-05: 25 mg via ORAL

## 2021-01-05 NOTE — Progress Notes (Signed)
Ok to treat with echo from march per Dr Jana Hakim.  Pt aware she will need one prior to next treatment.

## 2021-01-05 NOTE — Patient Instructions (Signed)
[facility] ONCOLOGY   Discharge Instructions: Thank you for choosing [facility] to provide your oncology and hematology care.   If you have a lab appointment with the [facility], please go directly to the [facility] and check in at the registration area.   Wear comfortable clothing and clothing appropriate for easy access to any Portacath or PICC line.   We strive to give you quality time with your provider. You may need to reschedule your appointment if you arrive late (15 or more minutes).  Arriving late affects you and other patients whose appointments are after yours.  Also, if you miss three or more appointments without notifying the office, you may be dismissed from the clinic at the provider's discretion.      For prescription refill requests, have your pharmacy contact our office and allow 72 hours for refills to be completed.    Today you received the following chemotherapy and/or immunotherapy agents: Trastuzumab hyaluronidase (Herceptin hylecta)      To help prevent nausea and vomiting after your treatment, we encourage you to take your nausea medication as directed.  BELOW ARE SYMPTOMS THAT SHOULD BE REPORTED IMMEDIATELY: *FEVER GREATER THAN 100.4 F (38 C) OR HIGHER *CHILLS OR SWEATING *NAUSEA AND VOMITING THAT IS NOT CONTROLLED WITH YOUR NAUSEA MEDICATION *UNUSUAL SHORTNESS OF BREATH *UNUSUAL BRUISING OR BLEEDING *URINARY PROBLEMS (pain or burning when urinating, or frequent urination) *BOWEL PROBLEMS (unusual diarrhea, constipation, pain near the anus) TENDERNESS IN MOUTH AND THROAT WITH OR WITHOUT PRESENCE OF ULCERS (sore throat, sores in mouth, or a toothache) UNUSUAL RASH, SWELLING OR PAIN  UNUSUAL VAGINAL DISCHARGE OR ITCHING   Items with * indicate a potential emergency and should be followed up as soon as possible or go to the Emergency Department if any problems should occur.  Please show the CHEMOTHERAPY ALERT CARD or  IMMUNOTHERAPY ALERT CARD at check-in to the Emergency Department and triage nurse.  Should you have questions after your visit or need to cancel or reschedule your appointment, please contact [facility]  Dept: [phone]  and follow the prompts.  Office hours are 8:00 a.m. to 4:30 p.m. Monday - Friday. Please note that voicemails left after 4:00 p.m. may not be returned until the following business day.  We are closed weekends and major holidays. You have access to a nurse at all times for urgent questions. Please call the main number to the clinic Dept: [phone] and follow the prompts.   For any non-urgent questions, you may also contact your provider using MyChart. We now offer e-Visits for anyone 40 and older to request care online for non-urgent symptoms. For details visit [email or web address].   Also download the MyChart app! Go to the app store, search "MyChart", open the app, select [facility], and log in with your MyChart username and password.  Due to Covid, a mask is required upon entering the hospital/clinic. If you do not have a mask, one will be given to you upon arrival. For doctor visits, patients may have 1 support person aged 52 or older with them. For treatment visits, patients cannot have anyone with them due to current Covid guidelines and our immunocompromised population.

## 2021-01-05 NOTE — Progress Notes (Signed)
Sheryl Mcmillan has been reluctant to have a repeat echocardiogram because of a chest discomfort and scar issues.  She will need to have one before her next dose of Herceptin 01/26/2021 otherwise we will postpone her treatment.  Orders have been placed

## 2021-01-05 NOTE — Therapy (Signed)
[place], [place], [address] Phone: [phone]   Fax:  [phone]  Physical Therapy Treatment  Patient Details  Name: LETASHA KERSHAW MRN: [id number] Date of Birth: Sep 11, 1975 Referring Provider (PT): Dr. [name]   Encounter Date: 01/05/2021   PT End of Session - 01/05/21 1104     Visit Number 10    Number of Visits 18    Date for PT Re-Evaluation 02/02/21    Authorization Type [employer] - automatically authorized for 12 visits; will need to seek approval if you need more than 12.    Authorization - Visit Number 10    Authorization - Number of Visits 12    PT Start Time 0177    PT Stop Time 1102    PT Time Calculation (min) 60 min    Activity Tolerance Patient tolerated treatment well;Treatment limited secondary to medical complications (Comment)    Behavior During Therapy [facility] for tasks assessed/performed             Past Medical History:  Diagnosis Date   Arthritis    Breast cancer ([place])    Family history of non-Hodgkin's lymphoma 06/01/2020   Hemorrhoids    Migraines    Obesity    PCOS (polycystic ovarian syndrome)     Past Surgical History:  Procedure Laterality Date   AXILLARY SENTINEL NODE BIOPSY Left 11/07/2020   Procedure: LEFT AXILLARY SENTINEL NODE BIOPSY;  Surgeon: [name], MD;  Location: [place];  Service: General;  Laterality: Left;   BREAST RECONSTRUCTION WITH PLACEMENT OF TISSUE EXPANDER AND FLEX HD (ACELLULAR HYDRATED DERMIS) Bilateral 11/07/2020   Procedure: BILATERAL BREAST RECONSTRUCTION WITH PLACEMENT OF TISSUE EXPANDER AND FLEX HD (ACELLULAR HYDRATED DERMIS);  Surgeon: [name], MD;  Location: [place];  Service: Plastics;  Laterality: Bilateral;   COSMETIC SURGERY     DILATION AND CURETTAGE OF UTERUS     HEMORRHOID SURGERY     IR IMAGING GUIDED PORT INSERTION  06/15/2020   PORT-A-CATH REMOVAL Right 11/07/2020   Procedure: REMOVAL  PORT-A-CATH;  Surgeon: [name], MD;  Location: [place];  Service: General;  Laterality: Right;   TOTAL MASTECTOMY Bilateral 11/07/2020   Procedure: BILATERAL MASTECTOMY;  Surgeon: [name], MD;  Location: [place];  Service: General;  Laterality: Bilateral;    There were no vitals filed for this visit.   Subjective Assessment - 01/05/21 1011     Subjective I saw Dr. [name] yesterday. He said my incision is healing well on the Lt side so he did another fill and will continue to do so weekly a bit longer. He also said he can remove the excess tissue at the bil lateral trunk areas so I was real happy about that.    Pertinent History Patient was diagnosed on 05/10/2020 with left grade III invasive ductal carcinoma breast cancer. She had a bilateral mastectomy and left sentinel node biopsy (1 negative node) on 11/07/2020 It is ER positive, PR negative, and HER2 positive with a Ki67 of 50%. She underwent plastic surgery to her left arm at age 68 when her arm was severely cut in a glass door.    Patient Stated Goals Get arm moving normally and pain under control. Unable to wipe bottom.    Currently in Pain? No/denies                               [facility] Adult  PT Treatment/Exercise - 01/05/21 0001       Manual Therapy   Soft tissue mobilization Gently over Lt pectoralis near insertion where palpably tight at end P/ROM; also to Rt pectoralis staying superior to expander and at Rt axilla    Manual Lymphatic Drainage (MLD) In Supine with HOB elevated: Short neck, 5 diaphragmatic breaths, bil inguinal nodes, bil axillo-inguinal anastomosis focsuing on bil lateral trunk areas of fullness and being mindful of still healing incision at inferior Lt mastectomy incision.    Passive ROM In Supine to bil shoulders into flexion and abduction to tolerance and ensuring no pull at Lt mastectomy incision                         PT Long Term Goals - 01/05/21 1223        PT LONG TERM GOAL #1   Title Patient will demonstrate she has regained full shoulder ROM and function post opeeratively compared to baselines.    Status On-going      PT LONG TERM GOAL #2   Title Patient will increase bilateral shoulder active flexion to >/= 150 degrees for increased ease reaching overhead.    Baseline Rt ~150 and Lt ~130 degrees of P/ROM with stretching today-01/05/21    Status Partially Met      PT LONG TERM GOAL #3   Title Patient will increase bilateral shoulder active abduction to >/= 140 degrees for increased ease reaching overhead.    Baseline Rt ~140 and Lt ~110 degrees of P/ROM (tightness at bil pectoralis limits end motionl)- 01/05/21    Status Partially Met      PT LONG TERM GOAL #4   Title Patient will decrease her DASH score to </= 38.64 to achieve baseline arm function.    Baseline 54.55      PT LONG TERM GOAL #5   Title Patient will verbalize good understanding of lympehdema risk reduction practices.    Baseline No knowledge; pt has participated in the ABC Class and has no further questions reagrding lymphedema risk reduction-01/05/21    Status Achieved      PT LONG TERM GOAL #6   Title Patient will report she has decreased pain by >/= 50% with daily tasks to reduce need for medication and tolerate normal ADLs.    Baseline Pain currently varies from 5-10/10 with most daily tasks.; pt reports not needing pain medication any longer as her pain is much improved - 01/05/21    Status Achieved                   Plan - 01/05/21 1233     Clinical Impression Statement Pt returns to physical therapy after having a 350 cc's fill into bil expanders with Dr. [name] yesterday. He reports her Lt incision is beginning to heal well though tissue still exposed so continued being mindful of allowing no pull here today with Lt shoulder P/ROM. Continued with MLD to bil lateral trunk areas where increased fullness. Pt reports Dr. [name] will be able to help reduce the  extra tissue here when she has her implant exchange once fills are complete. Also continued with focus on end bil shoulder P/ROM as she is still tight and limited here due to pectoralis limitations and axillary tightness. Pt will benefot from continued physical therapy to help improve her end P/ROM and decrease pectoralis tightness in preparation for her upcoming surgery.    Stability/Clinical Decision Making Stable/Uncomplicated  Rehab Potential Excellent    PT Frequency 2x / week    PT Duration 4 weeks    PT Treatment/Interventions ADLs/Self Care Home Management;Therapeutic exercise;Patient/family education;Manual techniques;Passive range of motion;Scar mobilization    PT Next Visit Plan Renewal done this session; remeasure A/ROM for goal assess; cont pulleys and ball roll up wall, progress HEP; Continue MLD to bil  chest at least until effectiveness is determined. Cont to monitor left chest incision and limit stretching skin here . Cont PROM and STM prn    PT Home Exercise Plan Post op shoulder ROM HEP    Consulted and Agree with Plan of Care Patient             Patient will benefit from skilled therapeutic intervention in order to improve the following deficits and impairments:  Postural dysfunction, Decreased range of motion, Decreased knowledge of precautions, Impaired UE functional use, Pain, Decreased scar mobility  Visit Diagnosis: Aftercare following surgery for neoplasm  Stiffness of left shoulder, not elsewhere classified  Stiffness of right shoulder, not elsewhere classified  Abnormal posture  Pain in right upper arm  Pain in left upper arm  Malignant neoplasm of upper-outer quadrant of left breast in female, estrogen receptor positive ([place])     Problem List Patient Active Problem List   Diagnosis Date Noted   Peripheral neuropathy due to chemotherapy ([place]) 10/20/2020   Morbid obesity with BMI of 40.0-44.9, adult ([place]) 07/19/2020   Port-A-Cath in place  07/07/2020   BRCA1 gene mutation positive 07/04/2020   Genetic testing 06/28/2020   Family history of non-Hodgkin's lymphoma 06/01/2020   Malignant neoplasm of upper-outer quadrant of left breast in female, estrogen receptor positive ([place]) 05/26/2020    Otelia Limes, PTA 01/05/2021, 12:42 PM  [place], [place], [address] Phone: [phone]   Fax:  [phone]  Name: LAVENE PENAGOS MRN: [id number] Date of Birth: 1975-07-31

## 2021-01-06 ENCOUNTER — Telehealth: Payer: Self-pay | Admitting: Plastic Surgery

## 2021-01-06 NOTE — Telephone Encounter (Signed)
Patient would like to know if she can go swimming yet. Please call to advise.

## 2021-01-09 NOTE — Telephone Encounter (Signed)
Called and spoke with the patient on (11/06/20) regarding the message below.  Informed the patient that I spoke with [facility] and he stated that no she should not go swimming yet because she don't want to run the risk of having an open wound and getting an infection from getting in the water.  Patient verbalized understanding and agreed.//AB/CMA

## 2021-01-10 ENCOUNTER — Encounter: Payer: Self-pay | Admitting: Physical Therapy

## 2021-01-11 ENCOUNTER — Ambulatory Visit (INDEPENDENT_AMBULATORY_CARE_PROVIDER_SITE_OTHER): Payer: Medicaid Other | Admitting: Plastic Surgery

## 2021-01-11 ENCOUNTER — Other Ambulatory Visit: Payer: Self-pay

## 2021-01-11 DIAGNOSIS — C50412 Malignant neoplasm of upper-outer quadrant of left female breast: Secondary | ICD-10-CM

## 2021-01-11 DIAGNOSIS — Z170 Estrogen receptor positive status [ER+]: Secondary | ICD-10-CM

## 2021-01-11 NOTE — Progress Notes (Signed)
Patient presents for another tissue expander fill.  She overall feels good and had no issues with her last fill.  Coming in today she had 350 cc and a 700 cc expander on both sides.  She is still nursing along the small wound at the inferior T-junction on the left side.  It does not appear to be getting any worse and seems to be filling in as the weeks go by.  Both expanders were filled with an additional 100 cc of volume today giving her 450 cc and 700 cc expanders.  She tolerated this fine.  We will plan to see her next week for an additional fill.

## 2021-01-12 ENCOUNTER — Ambulatory Visit: Payer: Medicaid Other

## 2021-01-12 DIAGNOSIS — Z483 Aftercare following surgery for neoplasm: Secondary | ICD-10-CM

## 2021-01-12 DIAGNOSIS — C50412 Malignant neoplasm of upper-outer quadrant of left female breast: Secondary | ICD-10-CM

## 2021-01-12 DIAGNOSIS — M79621 Pain in right upper arm: Secondary | ICD-10-CM

## 2021-01-12 DIAGNOSIS — M79622 Pain in left upper arm: Secondary | ICD-10-CM

## 2021-01-12 DIAGNOSIS — R293 Abnormal posture: Secondary | ICD-10-CM

## 2021-01-12 DIAGNOSIS — Z5111 Encounter for antineoplastic chemotherapy: Secondary | ICD-10-CM | POA: Diagnosis not present

## 2021-01-12 DIAGNOSIS — M25612 Stiffness of left shoulder, not elsewhere classified: Secondary | ICD-10-CM

## 2021-01-12 DIAGNOSIS — M25611 Stiffness of right shoulder, not elsewhere classified: Secondary | ICD-10-CM

## 2021-01-12 NOTE — Therapy (Signed)
[place], [place], [address] Phone: [phone]   Fax:  [phone]  Physical Therapy Treatment  Patient Details  Name: Sheryl Mcmillan MRN: [id number] Date of Birth: 10-03-1975 Referring Provider (PT): Dr. [name]   Encounter Date: 01/12/2021   PT End of Session - 01/12/21 1103     Visit Number 11    Number of Visits 18    Date for PT Re-Evaluation 02/02/21    Authorization Type [employer] - automatically authorized for 12 visits; will need to seek approval if you need more than 12.    Authorization - Visit Number 11    Authorization - Number of Visits 12    PT Start Time 1003    PT Stop Time 1104    PT Time Calculation (min) 61 min    Activity Tolerance Patient tolerated treatment well;Treatment limited secondary to medical complications (Comment)    Behavior During Therapy [facility] for tasks assessed/performed             Past Medical History:  Diagnosis Date   Arthritis    Breast cancer ([place])    Family history of non-Hodgkin's lymphoma 06/01/2020   Hemorrhoids    Migraines    Obesity    PCOS (polycystic ovarian syndrome)     Past Surgical History:  Procedure Laterality Date   AXILLARY SENTINEL NODE BIOPSY Left 11/07/2020   Procedure: LEFT AXILLARY SENTINEL NODE BIOPSY;  Surgeon: [name], MD;  Location: [place];  Service: General;  Laterality: Left;   BREAST RECONSTRUCTION WITH PLACEMENT OF TISSUE EXPANDER AND FLEX HD (ACELLULAR HYDRATED DERMIS) Bilateral 11/07/2020   Procedure: BILATERAL BREAST RECONSTRUCTION WITH PLACEMENT OF TISSUE EXPANDER AND FLEX HD (ACELLULAR HYDRATED DERMIS);  Surgeon: [name], MD;  Location: [place];  Service: Plastics;  Laterality: Bilateral;   COSMETIC SURGERY     DILATION AND CURETTAGE OF UTERUS     HEMORRHOID SURGERY     IR IMAGING GUIDED PORT INSERTION  06/15/2020   PORT-A-CATH REMOVAL Right 11/07/2020   Procedure: REMOVAL  PORT-A-CATH;  Surgeon: [name], MD;  Location: [place];  Service: General;  Laterality: Right;   TOTAL MASTECTOMY Bilateral 11/07/2020   Procedure: BILATERAL MASTECTOMY;  Surgeon: [name], MD;  Location: [place];  Service: General;  Laterality: Bilateral;    There were no vitals filed for this visit.   Subjective Assessment - 01/12/21 1007     Subjective I had another fill yesterday and Dr. [name] said we are half way finished with the fills. I have a tooth hurting real bad and I was put on amoxicillin for that.    Pertinent History Patient was diagnosed on 05/10/2020 with left grade III invasive ductal carcinoma breast cancer. She had a bilateral mastectomy and left sentinel node biopsy (1 negative node) on 11/07/2020 It is ER positive, PR negative, and HER2 positive with a Ki67 of 50%. She underwent plastic surgery to her left arm at age 45 when her arm was severely cut in a glass door.    Patient Stated Goals Get arm moving normally and pain under control. Unable to wipe bottom.    Currently in Pain? No/denies                               [facility] Adult PT Treatment/Exercise - 01/12/21 0001       Shoulder Exercises: Pulleys   ABduction Other (comment)  4 mins     Shoulder Exercises: Therapy Ball   Flexion Both;10 reps   forward lean into end of stretch     Manual Therapy   Soft tissue mobilization Gently over Lt pectoralis near insertion where palpably tight at end P/ROM; also to Rt pectoralis staying superior to expander and at Rt axilla    Manual Lymphatic Drainage (MLD) In Supine with HOB elevated: Short neck, 5 diaphragmatic breaths, bil inguinal nodes, bil axillo-inguinal anastomosis focsuing on bil lateral trunk areas of fullness and being mindful of still healing incision at inferior Lt mastectomy incision.    Passive ROM In Supine to bil shoulders into flexion and abduction to tolerance and ensuring no pull at Lt mastectomy incision                          PT Long Term Goals - 01/05/21 1223       PT LONG TERM GOAL #1   Title Patient will demonstrate she has regained full shoulder ROM and function post opeeratively compared to baselines.    Status On-going      PT LONG TERM GOAL #2   Title Patient will increase bilateral shoulder active flexion to >/= 150 degrees for increased ease reaching overhead.    Baseline Rt ~150 and Lt ~130 degrees of P/ROM with stretching today-01/05/21    Status Partially Met      PT LONG TERM GOAL #3   Title Patient will increase bilateral shoulder active abduction to >/= 140 degrees for increased ease reaching overhead.    Baseline Rt ~140 and Lt ~110 degrees of P/ROM (tightness at bil pectoralis limits end motionl)- 01/05/21    Status Partially Met      PT LONG TERM GOAL #4   Title Patient will decrease her DASH score to </= 38.64 to achieve baseline arm function.    Baseline 54.55      PT LONG TERM GOAL #5   Title Patient will verbalize good understanding of lympehdema risk reduction practices.    Baseline No knowledge; pt has participated in the ABC Class and has no further questions reagrding lymphedema risk reduction-01/05/21    Status Achieved      PT LONG TERM GOAL #6   Title Patient will report she has decreased pain by >/= 50% with daily tasks to reduce need for medication and tolerate normal ADLs.    Baseline Pain currently varies from 5-10/10 with most daily tasks.; pt reports not needing pain medication any longer as her pain is much improved - 01/05/21    Status Achieved                   Plan - 01/12/21 1104     Clinical Impression Statement Continued with focus on manual therapy as pts Lt inferior masectomy incision is still healing, though this is slowly improving at each session with wound size decreasing and very minimal to no drainage now per pt report. Continued with AA/ROM with pulleys and ball roll up wall instructing her to stop if she feels a  pull at Lt incision. Pt reports just feleing good stretches in bil pects with all exs. Continued to hold off progression of supine scapular series with yellow theraband due to the still healing incision.    Stability/Clinical Decision Making Stable/Uncomplicated    Rehab Potential Excellent    PT Frequency 2x / week    PT Duration 4 weeks    PT Treatment/Interventions  ADLs/Self Care Home Management;Therapeutic exercise;Patient/family education;Manual techniques;Passive range of motion;Scar mobilization    PT Next Visit Plan Request for Medicaid visits sent today; remeasure A/ROM for goal assess; cont pulleys and ball roll up wall, progress HEP; Continue MLD to bil  chest at least until effectiveness is determined. Cont to monitor left chest incision and limit stretching skin here . Cont PROM and STM prn; when incision healed enough, add supine scapular series    PT Home Exercise Plan Post op shoulder ROM HEP    Consulted and Agree with Plan of Care Patient             Patient will benefit from skilled therapeutic intervention in order to improve the following deficits and impairments:  Postural dysfunction, Decreased range of motion, Decreased knowledge of precautions, Impaired UE functional use, Pain, Decreased scar mobility  Visit Diagnosis: Aftercare following surgery for neoplasm  Stiffness of left shoulder, not elsewhere classified  Stiffness of right shoulder, not elsewhere classified  Abnormal posture  Pain in right upper arm  Pain in left upper arm  Malignant neoplasm of upper-outer quadrant of left breast in female, estrogen receptor positive ([place])     Problem List Patient Active Problem List   Diagnosis Date Noted   Peripheral neuropathy due to chemotherapy ([place]) 10/20/2020   Morbid obesity with BMI of 40.0-44.9, adult ([place]) 07/19/2020   Port-A-Cath in place 07/07/2020   BRCA1 gene mutation positive 07/04/2020   Genetic testing 06/28/2020   Family history of  non-Hodgkin's lymphoma 06/01/2020   Malignant neoplasm of upper-outer quadrant of left breast in female, estrogen receptor positive ([place]) 05/26/2020    Otelia Limes, PTA 01/12/2021, 11:21 AM  [place], [place], [address] Phone: [phone]   Fax:  [phone]  Name: ESTEFANNY MOLER MRN: [id number] Date of Birth: [birth year]

## 2021-01-12 NOTE — Addendum Note (Signed)
Addended by: Shan Levans R on: 01/12/2021 12:25 PM   Modules accepted: Orders

## 2021-01-17 ENCOUNTER — Ambulatory Visit: Payer: Medicaid Other | Admitting: Rehabilitation

## 2021-01-17 ENCOUNTER — Other Ambulatory Visit: Payer: Self-pay

## 2021-01-17 ENCOUNTER — Encounter: Payer: Self-pay | Admitting: Rehabilitation

## 2021-01-17 DIAGNOSIS — Z5111 Encounter for antineoplastic chemotherapy: Secondary | ICD-10-CM | POA: Diagnosis not present

## 2021-01-17 DIAGNOSIS — R293 Abnormal posture: Secondary | ICD-10-CM

## 2021-01-17 DIAGNOSIS — M25611 Stiffness of right shoulder, not elsewhere classified: Secondary | ICD-10-CM

## 2021-01-17 DIAGNOSIS — M25612 Stiffness of left shoulder, not elsewhere classified: Secondary | ICD-10-CM

## 2021-01-17 DIAGNOSIS — M79621 Pain in right upper arm: Secondary | ICD-10-CM

## 2021-01-17 DIAGNOSIS — Z483 Aftercare following surgery for neoplasm: Secondary | ICD-10-CM

## 2021-01-17 DIAGNOSIS — M79622 Pain in left upper arm: Secondary | ICD-10-CM

## 2021-01-17 NOTE — Therapy (Signed)
[place], [place], [address] Phone: [phone]   Fax:  [phone]  Physical Therapy Treatment  Patient Details  Name: Sheryl Mcmillan MRN: [id number] Date of Birth: [birth year] Referring Provider (PT): Dr. [name]   Encounter Date: 01/17/2021   PT End of Session - 01/17/21 1053     Visit Number 12    Number of Visits 18    Date for PT Re-Evaluation 02/02/21    Authorization - Visit Number 12    Authorization - Number of Visits 12    PT Start Time 1004    PT Stop Time 2482    PT Time Calculation (min) 49 min    Activity Tolerance Patient tolerated treatment well;Treatment limited secondary to medical complications (Comment)    Behavior During Therapy [facility] for tasks assessed/performed             Past Medical History:  Diagnosis Date   Arthritis    Breast cancer ([place])    Family history of non-Hodgkin's lymphoma 06/01/2020   Hemorrhoids    Migraines    Obesity    PCOS (polycystic ovarian syndrome)     Past Surgical History:  Procedure Laterality Date   AXILLARY SENTINEL NODE BIOPSY Left 11/07/2020   Procedure: LEFT AXILLARY SENTINEL NODE BIOPSY;  Surgeon: [name], MD;  Location: [place];  Service: General;  Laterality: Left;   BREAST RECONSTRUCTION WITH PLACEMENT OF TISSUE EXPANDER AND FLEX HD (ACELLULAR HYDRATED DERMIS) Bilateral 11/07/2020   Procedure: BILATERAL BREAST RECONSTRUCTION WITH PLACEMENT OF TISSUE EXPANDER AND FLEX HD (ACELLULAR HYDRATED DERMIS);  Surgeon: [name], MD;  Location: [place];  Service: Plastics;  Laterality: Bilateral;   COSMETIC SURGERY     DILATION AND CURETTAGE OF UTERUS     HEMORRHOID SURGERY     IR IMAGING GUIDED PORT INSERTION  06/15/2020   PORT-A-CATH REMOVAL Right 11/07/2020   Procedure: REMOVAL PORT-A-CATH;  Surgeon: [name], MD;  Location: [place];  Service: General;  Laterality: Right;   TOTAL MASTECTOMY Bilateral  11/07/2020   Procedure: BILATERAL MASTECTOMY;  Surgeon: [name], MD;  Location: [place];  Service: General;  Laterality: Bilateral;    There were no vitals filed for this visit.   Subjective Assessment - 01/17/21 1008     Subjective Nothing new.    Pertinent History Patient was diagnosed on 05/10/2020 with left grade III invasive ductal carcinoma breast cancer. She had a bilateral mastectomy and left sentinel node biopsy (1 negative node) on 11/07/2020 It is ER positive, PR negative, and HER2 positive with a Ki67 of 50%. She underwent plastic surgery to her left arm at age 7 when her arm was severely cut in a glass door.    Patient Stated Goals Get arm moving normally and pain under control. Unable to wipe bottom.    Currently in Pain? No/denies                [facility] PT Assessment - 01/17/21 0001       AROM   Right Shoulder Flexion 155 Degrees    Right Shoulder ABduction 170 Degrees    Left Shoulder Flexion 143 Degrees    Left Shoulder ABduction 170 Degrees                           [facility] Adult PT Treatment/Exercise - 01/17/21 0001       Shoulder Exercises: Pulleys   Flexion 2 minutes  ABduction 2 minutes      Shoulder Exercises: Therapy Ball   Flexion Both;10 reps      Manual Therapy   Manual Lymphatic Drainage (MLD) In Supine with HOB elevated: Short neck, 5 diaphragmatic breaths, bil inguinal nodes, bil axillo-inguinal anastomosis focsuing on bil lateral trunk areas of fullness and being mindful of still healing incision at inferior Lt mastectomy incision.                         PT Long Term Goals - 01/17/21 1018       PT LONG TERM GOAL #1   Title Patient will demonstrate she has regained full shoulder ROM and function post opeeratively compared to baselines.    Baseline still with some pull    Status Achieved      PT LONG TERM GOAL #2   Title Patient will increase bilateral shoulder active flexion to >/= 150 degrees  for increased ease reaching overhead.    Status Achieved      PT LONG TERM GOAL #3   Title Patient will increase bilateral shoulder active abduction to >/= 140 degrees for increased ease reaching overhead.    Status Achieved      PT LONG TERM GOAL #4   Title Patient will decrease her DASH score to </= 38.64 to achieve baseline arm function.    Status On-going      PT LONG TERM GOAL #5   Title Patient will verbalize good understanding of lympehdema risk reduction practices.    Status Achieved      PT LONG TERM GOAL #6   Title Patient will report she has decreased pain by >/= 50% with daily tasks to reduce need for medication and tolerate normal ADLs.    Status Achieved                   Plan - 01/17/21 1054     Clinical Impression Statement Pt has met ROM goals with return to baseline.  Reports no ADL limitations but still overall stiffness due to expanders.  Pt will see the MD tomorrow and ask about ok for gym and would like to work on some more strengthening and return to gym.  more viists not yet approved so pt will be notified Wednesday afternoon if not back. Effectiveness of MLD is undetermined.  Pt does not know if it helps the lateral trunk but enjoys it.    PT Frequency 2x / week    PT Duration 4 weeks    PT Treatment/Interventions ADLs/Self Care Home Management;Therapeutic exercise;Patient/family education;Manual techniques;Passive range of motion;Scar mobilization    PT Next Visit Plan add medicaid auth as back Ok from MD to go to gym/strengthen? Cont MLD and PROM as well as chest mobility when incision healed enough, add supine scapular series             Patient will benefit from skilled therapeutic intervention in order to improve the following deficits and impairments:     Visit Diagnosis: Aftercare following surgery for neoplasm  Stiffness of left shoulder, not elsewhere classified  Stiffness of right shoulder, not elsewhere classified  Pain in right  upper arm  Pain in left upper arm  Abnormal posture     Problem List Patient Active Problem List   Diagnosis Date Noted   Peripheral neuropathy due to chemotherapy ([place]) 10/20/2020   Morbid obesity with BMI of 40.0-44.9, adult ([place]) 07/19/2020   Port-A-Cath in place 07/07/2020  BRCA1 gene mutation positive 07/04/2020   Genetic testing 06/28/2020   Family history of non-Hodgkin's lymphoma 06/01/2020   Malignant neoplasm of upper-outer quadrant of left breast in female, estrogen receptor positive ([place]) 05/26/2020    Sheryl Mcmillan 01/17/2021, 10:57 AM  [place], [place], [address] Phone: [phone]   Fax:  [phone]  Name: Sheryl Mcmillan MRN: [id number] Date of Birth: February 26, 1976

## 2021-01-18 ENCOUNTER — Encounter: Payer: Self-pay | Admitting: Genetic Counselor

## 2021-01-18 ENCOUNTER — Ambulatory Visit (INDEPENDENT_AMBULATORY_CARE_PROVIDER_SITE_OTHER): Payer: Medicaid Other | Admitting: Plastic Surgery

## 2021-01-18 ENCOUNTER — Ambulatory Visit (HOSPITAL_COMMUNITY)
Admission: RE | Admit: 2021-01-18 | Discharge: 2021-01-18 | Disposition: A | Discharge status: Home / Self Care | Payer: Medicaid Other | Source: Ambulatory Visit | Attending: Oncology | Admitting: Oncology

## 2021-01-18 DIAGNOSIS — I517 Cardiomegaly: Secondary | ICD-10-CM | POA: Insufficient documentation

## 2021-01-18 DIAGNOSIS — C50412 Malignant neoplasm of upper-outer quadrant of left female breast: Secondary | ICD-10-CM

## 2021-01-18 DIAGNOSIS — Z01818 Encounter for other preprocedural examination: Secondary | ICD-10-CM | POA: Insufficient documentation

## 2021-01-18 DIAGNOSIS — Z170 Estrogen receptor positive status [ER+]: Secondary | ICD-10-CM | POA: Diagnosis not present

## 2021-01-18 DIAGNOSIS — Z0189 Encounter for other specified special examinations: Secondary | ICD-10-CM

## 2021-01-18 LAB — ECHOCARDIOGRAM COMPLETE
Area-P 1/2: 4.33 cm2
S' Lateral: 3.4 cm

## 2021-01-18 MED ORDER — PERFLUTREN LIPID MICROSPHERE
1.0000 mL | INTRAVENOUS | Status: AC | PRN
  Administered 2021-01-18: 3 mL via INTRAVENOUS
  Filled 2021-01-18: qty 10

## 2021-01-18 NOTE — Progress Notes (Signed)
Patient presents today for an additional fill.  She feels good and feels like things are coming along well.  She currently has 450 cc and 700 cc expanders.  I added an additional 100 cc to both expanders today for total of 550 cc and 700 cc expanders.  The small wound on the left side at the T-junction looks to be getting even smaller with time.  No other concerns on exam.  We will plan to see her next week for an additional fill.

## 2021-01-18 NOTE — Progress Notes (Signed)
  Echocardiogram 2D Echocardiogram has been performed.  Sheryl Mcmillan 01/18/2021, 12:02 PM

## 2021-01-19 ENCOUNTER — Ambulatory Visit: Payer: Medicaid Other

## 2021-01-19 ENCOUNTER — Other Ambulatory Visit: Payer: Self-pay

## 2021-01-19 DIAGNOSIS — M25611 Stiffness of right shoulder, not elsewhere classified: Secondary | ICD-10-CM

## 2021-01-19 DIAGNOSIS — Z5111 Encounter for antineoplastic chemotherapy: Secondary | ICD-10-CM | POA: Diagnosis not present

## 2021-01-19 DIAGNOSIS — R293 Abnormal posture: Secondary | ICD-10-CM

## 2021-01-19 DIAGNOSIS — M79621 Pain in right upper arm: Secondary | ICD-10-CM

## 2021-01-19 DIAGNOSIS — Z483 Aftercare following surgery for neoplasm: Secondary | ICD-10-CM

## 2021-01-19 DIAGNOSIS — M79622 Pain in left upper arm: Secondary | ICD-10-CM

## 2021-01-19 DIAGNOSIS — C50412 Malignant neoplasm of upper-outer quadrant of left female breast: Secondary | ICD-10-CM

## 2021-01-19 DIAGNOSIS — M25612 Stiffness of left shoulder, not elsewhere classified: Secondary | ICD-10-CM

## 2021-01-19 NOTE — Therapy (Signed)
[place], [place], [address] Phone: [phone]   Fax:  [phone]  Physical Therapy Treatment  Patient Details  Name: Sheryl Mcmillan MRN: [id number] Date of Birth: July 30, 1975 Referring Provider (PT): Dr. [name]   Encounter Date: 01/19/2021   PT End of Session - 01/19/21 1110     Visit Number 13    Number of Visits 18    Date for PT Re-Evaluation 02/02/21    Authorization Type [employer] - automatically authorized for 12 visits; new approval 01/18/21-03/01/21 for 12 more visits    Authorization - Visit Number 53    Authorization - Number of Visits 24    PT Start Time 1009    PT Stop Time 1106    PT Time Calculation (min) 57 min    Activity Tolerance Patient tolerated treatment well;Treatment limited secondary to medical complications (Comment)    Behavior During Therapy [facility] for tasks assessed/performed             Past Medical History:  Diagnosis Date   Arthritis    Breast cancer ([place])    Family history of non-Hodgkin's lymphoma 06/01/2020   Hemorrhoids    Migraines    Obesity    PCOS (polycystic ovarian syndrome)     Past Surgical History:  Procedure Laterality Date   AXILLARY SENTINEL NODE BIOPSY Left 11/07/2020   Procedure: LEFT AXILLARY SENTINEL NODE BIOPSY;  Surgeon: [name], MD;  Location: [place];  Service: General;  Laterality: Left;   BREAST RECONSTRUCTION WITH PLACEMENT OF TISSUE EXPANDER AND FLEX HD (ACELLULAR HYDRATED DERMIS) Bilateral 11/07/2020   Procedure: BILATERAL BREAST RECONSTRUCTION WITH PLACEMENT OF TISSUE EXPANDER AND FLEX HD (ACELLULAR HYDRATED DERMIS);  Surgeon: [name], MD;  Location: [place];  Service: Plastics;  Laterality: Bilateral;   COSMETIC SURGERY     DILATION AND CURETTAGE OF UTERUS     HEMORRHOID SURGERY     IR IMAGING GUIDED PORT INSERTION  06/15/2020   PORT-A-CATH REMOVAL Right 11/07/2020   Procedure: REMOVAL  PORT-A-CATH;  Surgeon: [name], MD;  Location: [place];  Service: General;  Laterality: Right;   TOTAL MASTECTOMY Bilateral 11/07/2020   Procedure: BILATERAL MASTECTOMY;  Surgeon: [name], MD;  Location: [place];  Service: General;  Laterality: Bilateral;    There were no vitals filed for this visit.   Subjective Assessment - 01/19/21 1015     Subjective My scar is doing well and Dr. [name] said I can start doing light activity.    Pertinent History Patient was diagnosed on 05/10/2020 with left grade III invasive ductal carcinoma breast cancer. She had a bilateral mastectomy and left sentinel node biopsy (1 negative node) on 11/07/2020 It is ER positive, PR negative, and HER2 positive with a Ki67 of 50%. She underwent plastic surgery to her left arm at age 24 when her arm was severely cut in a glass door.    Patient Stated Goals Get arm moving normally and pain under control. Unable to wipe bottom.    Currently in Pain? No/denies                               [facility] Adult PT Treatment/Exercise - 01/19/21 0001       Shoulder Exercises: Pulleys   Flexion 2 minutes    ABduction 2 minutes      Shoulder Exercises: Therapy Ball   Flexion Both;10 reps  Manual Therapy   Soft tissue mobilization Gently over Lt pectoralis near insertion where palpably tight at end P/ROM; also to Rt pectoralis staying superior to expander and at Rt axilla    Manual Lymphatic Drainage (MLD) In Supine with HOB elevated: Short neck, 5 diaphragmatic breaths, bil inguinal nodes, bil axillo-inguinal anastomosis focsuing on bil lateral trunk areas of fullness and being mindful of still healing incision at inferior Lt mastectomy incision.    Passive ROM In Supine to bil shoulders into flexion and abduction to tolerance and ensuring no pull at Lt mastectomy incision                         PT Long Term Goals - 01/17/21 1018       PT LONG TERM GOAL #1   Title  Patient will demonstrate she has regained full shoulder ROM and function post opeeratively compared to baselines.    Baseline still with some pull    Status Achieved      PT LONG TERM GOAL #2   Title Patient will increase bilateral shoulder active flexion to >/= 150 degrees for increased ease reaching overhead.    Status Achieved      PT LONG TERM GOAL #3   Title Patient will increase bilateral shoulder active abduction to >/= 140 degrees for increased ease reaching overhead.    Status Achieved      PT LONG TERM GOAL #4   Title Patient will decrease her DASH score to </= 38.64 to achieve baseline arm function.    Status On-going      PT LONG TERM GOAL #5   Title Patient will verbalize good understanding of lympehdema risk reduction practices.    Status Achieved      PT LONG TERM GOAL #6   Title Patient will report she has decreased pain by >/= 50% with daily tasks to reduce need for medication and tolerate normal ADLs.    Status Achieved                   Plan - 01/19/21 1112     Clinical Impression Statement Pt reports okayed for light exercises so continued with AA/ROM and at next session will progress to include supine scapular series as her scab should be well healed by next week. Also continued with manual therapy working to decrease bil pectoralis tightness which is limiting her full end P/ROM. Incorporated MLD into manual therapy but only briefly as it is undetermined as how effective it is at reducing lateral trunk edema.    Stability/Clinical Decision Making Stable/Uncomplicated    Rehab Potential Excellent    PT Frequency 2x / week    PT Duration 4 weeks    PT Treatment/Interventions ADLs/Self Care Home Management;Therapeutic exercise;Patient/family education;Manual techniques;Passive range of motion;Scar mobilization    PT Next Visit Plan Add supine scapular series and/or bil UE 3 way raises next as doctor approved light activities;  Cont MLD and PROM as well as  chest mobility when incision healed enough    PT Home Exercise Plan Post op shoulder ROM HEP    Consulted and Agree with Plan of Care Patient             Patient will benefit from skilled therapeutic intervention in order to improve the following deficits and impairments:  Postural dysfunction, Decreased range of motion, Decreased knowledge of precautions, Impaired UE functional use, Pain, Decreased scar mobility  Visit Diagnosis: Aftercare following surgery for  neoplasm  Stiffness of left shoulder, not elsewhere classified  Stiffness of right shoulder, not elsewhere classified  Pain in right upper arm  Pain in left upper arm  Abnormal posture  Malignant neoplasm of upper-outer quadrant of left breast in female, estrogen receptor positive [facility])     Problem List Patient Active Problem List   Diagnosis Date Noted   Peripheral neuropathy due to chemotherapy ([place]) 10/20/2020   Morbid obesity with BMI of 40.0-44.9, adult ([place]) 07/19/2020   Port-A-Cath in place 07/07/2020   BRCA1 gene mutation positive 07/04/2020   Genetic testing 06/28/2020   Family history of non-Hodgkin's lymphoma 06/01/2020   Malignant neoplasm of upper-outer quadrant of left breast in female, estrogen receptor positive ([place]) 05/26/2020    Otelia Limes, PTA 01/19/2021, 11:23 AM  [place], [place], [address] Phone: [phone]   Fax:  [phone]  Name: Sheryl Mcmillan MRN: [id number] Date of Birth: 22-Feb-1976

## 2021-01-20 ENCOUNTER — Ambulatory Visit: Payer: Medicaid Other | Admitting: Adult Health

## 2021-01-20 ENCOUNTER — Ambulatory Visit: Payer: Medicaid Other

## 2021-01-20 ENCOUNTER — Other Ambulatory Visit: Payer: Medicaid Other

## 2021-01-23 ENCOUNTER — Ambulatory Visit: Payer: Medicaid Other | Attending: Oncology

## 2021-01-23 ENCOUNTER — Telehealth: Payer: Self-pay

## 2021-01-23 ENCOUNTER — Other Ambulatory Visit: Payer: Self-pay

## 2021-01-23 DIAGNOSIS — Z170 Estrogen receptor positive status [ER+]: Secondary | ICD-10-CM | POA: Insufficient documentation

## 2021-01-23 DIAGNOSIS — M25611 Stiffness of right shoulder, not elsewhere classified: Secondary | ICD-10-CM | POA: Diagnosis present

## 2021-01-23 DIAGNOSIS — R293 Abnormal posture: Secondary | ICD-10-CM | POA: Diagnosis present

## 2021-01-23 DIAGNOSIS — M79621 Pain in right upper arm: Secondary | ICD-10-CM | POA: Diagnosis present

## 2021-01-23 DIAGNOSIS — Z483 Aftercare following surgery for neoplasm: Secondary | ICD-10-CM | POA: Insufficient documentation

## 2021-01-23 DIAGNOSIS — C50412 Malignant neoplasm of upper-outer quadrant of left female breast: Secondary | ICD-10-CM | POA: Diagnosis present

## 2021-01-23 DIAGNOSIS — M25612 Stiffness of left shoulder, not elsewhere classified: Secondary | ICD-10-CM | POA: Insufficient documentation

## 2021-01-23 DIAGNOSIS — M79622 Pain in left upper arm: Secondary | ICD-10-CM | POA: Insufficient documentation

## 2021-01-23 NOTE — Patient Instructions (Signed)
3 Way Raises:      Starting Position:  Leaning against wall, walk feet a few inches away from the wall and make tummy tight (tuck hips underneath you) Press back/shoulders/head against wall as much as possible. Keep thumbs up to ceiling, elbows straight and shoulders relaxed/down throughout.  1. Lift arms in front to shoulder height 2. Lift arms a little wider into a "V" to shoulder height 3. Lift arms out to sides in a "T" to shoulder height  Perform 10 times in each direction. Hold 1-2 lbs to start with and work up to 2-3 sets of 10/day. Perform 3-4 times/week. Increase weight as able, decreasing sets of 10 each time you increase weights, then slowly working your way back up to 2-3 sets each time.    Cancer Rehab [phone]    Over Head Pull: Narrow and Wide Grip     On back, knees bent, feet flat, band across thighs, elbows straight but relaxed. Pull hands apart (start). Keeping elbows straight, bring arms up and over head, hands toward floor. Keep pull steady on band. Hold momentarily. Return slowly, keeping pull steady, back to start. Then do same with a wider grip on the band (past shoulder width) Repeat _5-10__ times. Band color __yellow____   Side Pull: Double Arm   On back, knees bent, feet flat. Arms perpendicular to body, shoulder level, elbows straight but relaxed. Pull arms out to sides, elbows straight. Resistance band comes across collarbones, hands toward floor. Hold momentarily. Slowly return to starting position. Repeat _5-10__ times. Band color _yellow____   Sword   On back, knees bent, feet flat, left hand on left hip, right hand above left. Pull right arm DIAGONALLY (hip to shoulder) across chest. Bring right arm along head toward floor. Hold momentarily. Slowly return to starting position. Repeat _5-10__ times. Do with left arm. Band color _yellow_____   Shoulder Rotation: Double Arm   On back, knees bent, feet flat, elbows tucked at sides, bent 90,  hands palms up. Pull hands apart and down toward floor, keeping elbows near sides. Hold momentarily. Slowly return to starting position. Repeat _5-10__ times. Band color __yellow____

## 2021-01-23 NOTE — Therapy (Signed)
[place], [place], [address] Phone: [phone]   Fax:  [phone]  Physical Therapy Treatment  Patient Details  Name: Sheryl Mcmillan MRN: [id number] Date of Birth: 1976/02/16 Referring Provider (PT): Dr. [name]   Encounter Date: 01/23/2021   PT End of Session - 01/23/21 1504     Visit Number 14    Number of Visits 18    Date for PT Re-Evaluation 02/02/21    Authorization Type [employer] - automatically authorized for 12 visits; new approval 01/18/21-03/01/21 for 12 more visits    Authorization - Visit Number 14    Authorization - Number of Visits 24    PT Start Time 1406    PT Stop Time 1503    PT Time Calculation (min) 57 min    Activity Tolerance Patient tolerated treatment well    Behavior During Therapy [facility] for tasks assessed/performed             Past Medical History:  Diagnosis Date   Arthritis    Breast cancer ([place])    Family history of non-Hodgkin's lymphoma 06/01/2020   Hemorrhoids    Migraines    Obesity    PCOS (polycystic ovarian syndrome)     Past Surgical History:  Procedure Laterality Date   AXILLARY SENTINEL NODE BIOPSY Left 11/07/2020   Procedure: LEFT AXILLARY SENTINEL NODE BIOPSY;  Surgeon: [name], MD;  Location: [place];  Service: General;  Laterality: Left;   BREAST RECONSTRUCTION WITH PLACEMENT OF TISSUE EXPANDER AND FLEX HD (ACELLULAR HYDRATED DERMIS) Bilateral 11/07/2020   Procedure: BILATERAL BREAST RECONSTRUCTION WITH PLACEMENT OF TISSUE EXPANDER AND FLEX HD (ACELLULAR HYDRATED DERMIS);  Surgeon: [name], MD;  Location: [place];  Service: Plastics;  Laterality: Bilateral;   COSMETIC SURGERY     DILATION AND CURETTAGE OF UTERUS     HEMORRHOID SURGERY     IR IMAGING GUIDED PORT INSERTION  06/15/2020   PORT-A-CATH REMOVAL Right 11/07/2020   Procedure: REMOVAL PORT-A-CATH;  Surgeon: [name], MD;  Location: [place];   Service: General;  Laterality: Right;   TOTAL MASTECTOMY Bilateral 11/07/2020   Procedure: BILATERAL MASTECTOMY;  Surgeon: [name], MD;  Location: [place];  Service: General;  Laterality: Bilateral;    There were no vitals filed for this visit.   Subjective Assessment - 01/23/21 1412     Subjective My hysterectomy got scheduled for 02/14/21. I'm still getting fills in my expanders weekly. My ROM is doing really well, I don't feel any tightness in my armpit when I reach anymore either. I am feeling independent again also with being able to wipe my bottom because my reach has improved and I feel stronger.    Pertinent History Patient was diagnosed on 05/10/2020 with left grade III invasive ductal carcinoma breast cancer. She had a bilateral mastectomy and left sentinel node biopsy (1 negative node) on 11/07/2020 It is ER positive, PR negative, and HER2 positive with a Ki67 of 50%. She underwent plastic surgery to her left arm at age 60 when her arm was severely cut in a glass door.    Patient Stated Goals Get arm moving normally and pain under control. Unable to wipe bottom.    Currently in Pain? No/denies                               [facility] Adult PT Treatment/Exercise - 01/23/21 0001  Shoulder Exercises: Supine   Horizontal ABduction Strengthening;Both;10 reps    Theraband Level (Shoulder Horizontal ABduction) Level 1 (Yellow)    Horizontal ABduction Limitations Returning therapist demo for all supine scapular series    External Rotation Strengthening;Both;10 reps;Theraband    Theraband Level (Shoulder External Rotation) Level 1 (Yellow)    Flexion Strengthening;Both;5 reps;Theraband   Narrow and Wide grip, x5 each   Theraband Level (Shoulder Flexion) Level 1 (Yellow)    Diagonals Strengthening;Right;Left;5 reps;Theraband    Theraband Level (Shoulder Diagonals) Level 1 (Yellow)      Shoulder Exercises: Standing   Other Standing Exercises Back against  wall/core engaged, head and shoulders against wall for bil UE 3 way raises with 2# for flexion and scaption, then 1# due to faitgue for abduction, x10 each      Shoulder Exercises: Pulleys   Flexion 2 minutes    ABduction 2 minutes      Manual Therapy   Manual Therapy Myofascial release;Passive ROM    Myofascial Release Gently to Lt pectoralis near axilla where pt palpably tighter    Passive ROM In Supine to Lt shoulder into flexion, abduction, and D2 to tolerance                    PT Education - 01/23/21 1437     Education Details Standing bil UE 3 way raises and supine scapular series with yellow theraband    Person(s) Educated Patient    Methods Explanation;Demonstration;Handout    Comprehension Verbalized understanding;Returned demonstration;Need further instruction                 PT Long Term Goals - 01/17/21 1018       PT LONG TERM GOAL #1   Title Patient will demonstrate she has regained full shoulder ROM and function post opeeratively compared to baselines.    Baseline still with some pull    Status Achieved      PT LONG TERM GOAL #2   Title Patient will increase bilateral shoulder active flexion to >/= 150 degrees for increased ease reaching overhead.    Status Achieved      PT LONG TERM GOAL #3   Title Patient will increase bilateral shoulder active abduction to >/= 140 degrees for increased ease reaching overhead.    Status Achieved      PT LONG TERM GOAL #4   Title Patient will decrease her DASH score to </= 38.64 to achieve baseline arm function.    Status On-going      PT LONG TERM GOAL #5   Title Patient will verbalize good understanding of lympehdema risk reduction practices.    Status Achieved      PT LONG TERM GOAL #6   Title Patient will report she has decreased pain by >/= 50% with daily tasks to reduce need for medication and tolerate normal ADLs.    Status Achieved                   Plan - 01/23/21 1505      Clinical Impression Statement Was able to progres pts activities today to light resistance with 1# weight in standing and yellow theraband in supine for postural strength. She tolerated these well reporting no pull at incision felt. then continued with only stretching Lt shoulder as pt reports some mild limitation here with end ROM stretching.    Stability/Clinical Decision Making Stable/Uncomplicated    Rehab Potential Excellent    PT Frequency 2x / week  PT Duration 4 weeks    PT Treatment/Interventions ADLs/Self Care Home Management;Therapeutic exercise;Patient/family education;Manual techniques;Passive range of motion;Scar mobilization    PT Next Visit Plan Review and assess supine scapular series and bil UE 3 way raises next as doctor approved light activities; cont LT shoulder P/ROM prn    PT Home Exercise Plan Post op shoulder ROM HEP; standing bil UE 3 way raises and supine scapular series    Consulted and Agree with Plan of Care Patient             Patient will benefit from skilled therapeutic intervention in order to improve the following deficits and impairments:  Postural dysfunction, Decreased range of motion, Decreased knowledge of precautions, Impaired UE functional use, Pain, Decreased scar mobility  Visit Diagnosis: Aftercare following surgery for neoplasm  Stiffness of left shoulder, not elsewhere classified  Stiffness of right shoulder, not elsewhere classified  Pain in right upper arm  Pain in left upper arm  Abnormal posture  Malignant neoplasm of upper-outer quadrant of left breast in female, estrogen receptor positive ([place])     Problem List Patient Active Problem List   Diagnosis Date Noted   Peripheral neuropathy due to chemotherapy ([place]) 10/20/2020   Morbid obesity with BMI of 40.0-44.9, adult ([place]) 07/19/2020   Port-A-Cath in place 07/07/2020   BRCA1 gene mutation positive 07/04/2020   Genetic testing 06/28/2020   Family history of non-Hodgkin's  lymphoma 06/01/2020   Malignant neoplasm of upper-outer quadrant of left breast in female, estrogen receptor positive ([place]) 05/26/2020    Otelia Limes, PTA 01/23/2021, 3:08 PM  [place], [place], [address] Phone: [phone]   Fax:  [phone]  Name: Sheryl Mcmillan MRN: [id number] Date of Birth: Feb 26, 1976

## 2021-01-23 NOTE — Telephone Encounter (Signed)
Told Sheryl Mcmillan that she is scheduled for surgery on 02-14-21~1:30 pm with Dr. Denman George and Dr. Hassell Done. Pt verbalized understanding.

## 2021-01-23 NOTE — Telephone Encounter (Signed)
Told Ms Channell that Zoila Shutter has reached out to [place] at  Dr. Earlie Server office to schedule the joint case.  The earliest it will be is 02-14-21 as Dr. Denman George is [place] and that is her first available opening.  Will reach out to her with a date one both schedules will be coordinated. Ms Darley verbalized understanding.

## 2021-01-24 ENCOUNTER — Other Ambulatory Visit: Payer: Self-pay | Admitting: Gynecologic Oncology

## 2021-01-24 DIAGNOSIS — Z1501 Genetic susceptibility to malignant neoplasm of breast: Secondary | ICD-10-CM

## 2021-01-25 ENCOUNTER — Ambulatory Visit: Payer: Medicaid Other

## 2021-01-25 ENCOUNTER — Ambulatory Visit (INDEPENDENT_AMBULATORY_CARE_PROVIDER_SITE_OTHER): Payer: Medicaid Other | Admitting: Plastic Surgery

## 2021-01-25 ENCOUNTER — Other Ambulatory Visit: Payer: Self-pay

## 2021-01-25 DIAGNOSIS — M25612 Stiffness of left shoulder, not elsewhere classified: Secondary | ICD-10-CM

## 2021-01-25 DIAGNOSIS — C50412 Malignant neoplasm of upper-outer quadrant of left female breast: Secondary | ICD-10-CM

## 2021-01-25 DIAGNOSIS — Z170 Estrogen receptor positive status [ER+]: Secondary | ICD-10-CM

## 2021-01-25 DIAGNOSIS — M79622 Pain in left upper arm: Secondary | ICD-10-CM

## 2021-01-25 DIAGNOSIS — R293 Abnormal posture: Secondary | ICD-10-CM

## 2021-01-25 DIAGNOSIS — Z483 Aftercare following surgery for neoplasm: Secondary | ICD-10-CM | POA: Diagnosis not present

## 2021-01-25 DIAGNOSIS — M79621 Pain in right upper arm: Secondary | ICD-10-CM

## 2021-01-25 DIAGNOSIS — M25611 Stiffness of right shoulder, not elsewhere classified: Secondary | ICD-10-CM

## 2021-01-25 NOTE — Progress Notes (Signed)
Patient presents for continued expansion of bilateral breast tissue expanders.  She feels like things are going well.  She currently has 550 cc and 700 cc expanders.  I had an additional 100 cc to both expanders for a total of 650 cc and 700 cc expanders.  No concerns regarding the wound at the T-junction on the left.  There is a spitting suture at the T-junction on the right which I was able to have removed today without any trouble.  I do think another fill would be beneficial for her.  I explained that would likely overfill to around 750 cc and then see what she thought about that size.  We might be able to add a little bit more if either of [place] wanted to at that point.  She is content with that and will continue the process.

## 2021-01-25 NOTE — Therapy (Signed)
[place], [place], [address] Phone: [phone]   Fax:  [phone]  Physical Therapy Treatment  Patient Details  Name: Sheryl Mcmillan MRN: [id number] Date of Birth: 02-10-1976 Referring Provider (PT): Dr. [name]   Encounter Date: 01/25/2021   PT End of Session - 01/25/21 1452     Visit Number 15    Number of Visits 18    Date for PT Re-Evaluation 02/02/21    Authorization Type [employer] - automatically authorized for 12 visits; new approval 01/18/21-03/01/21 for 12 more visits    Authorization - Visit Number 15    Authorization - Number of Visits 24    PT Start Time 1400    PT Stop Time 1448    PT Time Calculation (min) 48 min    Activity Tolerance Patient tolerated treatment well    Behavior During Therapy [facility] for tasks assessed/performed             Past Medical History:  Diagnosis Date   Arthritis    Breast cancer ([place])    Family history of non-Hodgkin's lymphoma 06/01/2020   Hemorrhoids    Migraines    Obesity    PCOS (polycystic ovarian syndrome)     Past Surgical History:  Procedure Laterality Date   AXILLARY SENTINEL NODE BIOPSY Left 11/07/2020   Procedure: LEFT AXILLARY SENTINEL NODE BIOPSY;  Surgeon: [name], MD;  Location: [facility];  Service: General;  Laterality: Left;   BREAST RECONSTRUCTION WITH PLACEMENT OF TISSUE EXPANDER AND FLEX HD (ACELLULAR HYDRATED DERMIS) Bilateral 11/07/2020   Procedure: BILATERAL BREAST RECONSTRUCTION WITH PLACEMENT OF TISSUE EXPANDER AND FLEX HD (ACELLULAR HYDRATED DERMIS);  Surgeon: [name], MD;  Location: [facility] [place];  Service: Plastics;  Laterality: Bilateral;   COSMETIC SURGERY     DILATION AND CURETTAGE OF UTERUS     HEMORRHOID SURGERY     IR IMAGING GUIDED PORT INSERTION  06/15/2020   PORT-A-CATH REMOVAL Right 11/07/2020   Procedure: REMOVAL PORT-A-CATH;  Surgeon: [name], MD;  Location: [facility];   Service: General;  Laterality: Right;   TOTAL MASTECTOMY Bilateral 11/07/2020   Procedure: BILATERAL MASTECTOMY;  Surgeon: [name], MD;  Location: [facility];  Service: General;  Laterality: Bilateral;    There were no vitals filed for this visit.   Subjective Assessment - 01/25/21 1402     Subjective Had 100 cc fill today and I am scheduled for another next week. I have my hysterectomy on 02/14/21.  Shoulder is doing pretty well.    Pertinent History Patient was diagnosed on 05/10/2020 with left grade III invasive ductal carcinoma breast cancer. She had a bilateral mastectomy and left sentinel node biopsy (1 negative node) on 11/07/2020 It is ER positive, PR negative, and HER2 positive with a Ki67 of 50%. She underwent plastic surgery to her left arm at age 63 when her arm was severely cut in a glass door.    Patient Stated Goals Get arm moving normally and pain under control. Unable to wipe bottom.    Currently in Pain? No/denies    Pain Score 0-No pain                               [facility] Adult PT Treatment/Exercise - 01/25/21 0001       Shoulder Exercises: Supine   Horizontal ABduction Strengthening;Both;10 reps    Theraband Level (Shoulder Horizontal ABduction) Level 1 (Yellow)  External Rotation Strengthening;Both;10 reps;Theraband    Theraband Level (Shoulder External Rotation) Level 1 (Yellow)    Flexion Strengthening;Both;5 reps;Theraband   Narrow and Wide grip, x5 each   Theraband Level (Shoulder Flexion) Level 1 (Yellow)    Diagonals Strengthening;Right;Left;5 reps;Theraband    Theraband Level (Shoulder Diagonals) Level 1 (Yellow)      Shoulder Exercises: Standing   Other Standing Exercises Back against wall/core engaged, head and shoulders against wall for bil UE 3 way raises with 2# for flexion and scaption, then 1# due to faitgue for abduction, x10 each      Shoulder Exercises: Pulleys   Flexion 2 minutes    ABduction 2 minutes      Shoulder  Exercises: Therapy Ball   Flexion 10 reps      Manual Therapy   Myofascial Release Gently to Lt pectoralis near axilla where pt palpably tighter    Passive ROM In Supine to Lt shoulder into flexion, abduction, and D2 to tolerance                         PT Long Term Goals - 01/17/21 1018       PT LONG TERM GOAL #1   Title Patient will demonstrate she has regained full shoulder ROM and function post opeeratively compared to baselines.    Baseline still with some pull    Status Achieved      PT LONG TERM GOAL #2   Title Patient will increase bilateral shoulder active flexion to >/= 150 degrees for increased ease reaching overhead.    Status Achieved      PT LONG TERM GOAL #3   Title Patient will increase bilateral shoulder active abduction to >/= 140 degrees for increased ease reaching overhead.    Status Achieved      PT LONG TERM GOAL #4   Title Patient will decrease her DASH score to </= 38.64 to achieve baseline arm function.    Status On-going      PT LONG TERM GOAL #5   Title Patient will verbalize good understanding of lympehdema risk reduction practices.    Status Achieved      PT LONG TERM GOAL #6   Title Patient will report she has decreased pain by >/= 50% with daily tasks to reduce need for medication and tolerate normal ADLs.    Status Achieved                   Plan - 01/25/21 1453     Clinical Impression Statement Continued to review theraband and 3 way shoulder exs in standing with pt demonstrating good form and tolerance for all.  Continued MFR to axilla and PROM to left shoulder.  Pts shoulder ROM looked excellent today.  She had a 100 cc fill today and was without complaints of pain.    Stability/Clinical Decision Making Stable/Uncomplicated    Rehab Potential Excellent    PT Frequency 2x / week    PT Duration 4 weeks    PT Treatment/Interventions ADLs/Self Care Home Management;Therapeutic exercise;Patient/family education;Manual  techniques;Passive range of motion;Scar mobilization    PT Next Visit Plan Review and assess supine scapular series and bil UE 3 way raises next as doctor approved light activities; cont LT shoulder P/ROM prn, consider standing postural TB    PT Home Exercise Plan Post op shoulder ROM HEP; standing bil UE 3 way raises and supine scapular series    Consulted and Agree with Plan  of Care Patient             Patient will benefit from skilled therapeutic intervention in order to improve the following deficits and impairments:  Postural dysfunction, Decreased range of motion, Decreased knowledge of precautions, Impaired UE functional use, Pain, Decreased scar mobility  Visit Diagnosis: Aftercare following surgery for neoplasm  Stiffness of left shoulder, not elsewhere classified  Stiffness of right shoulder, not elsewhere classified  Pain in right upper arm  Pain in left upper arm  Abnormal posture  Malignant neoplasm of upper-outer quadrant of left breast in female, estrogen receptor positive ([place])     Problem List Patient Active Problem List   Diagnosis Date Noted   Peripheral neuropathy due to chemotherapy ([place]) 10/20/2020   Morbid obesity with BMI of 40.0-44.9, adult ([place]) 07/19/2020   Port-A-Cath in place 07/07/2020   BRCA1 gene mutation positive 07/04/2020   Genetic testing 06/28/2020   Family history of non-Hodgkin's lymphoma 06/01/2020   Malignant neoplasm of upper-outer quadrant of left breast in female, estrogen receptor positive ([place]) 05/26/2020    Claris Pong 01/25/2021, 2:57 PM  [facility] [place], [place], [address] Phone: [phone]   Fax:  [phone]  Name: Sheryl Mcmillan MRN: [id number] Date of Birth: [birth year]  Cheral Almas, PT 01/25/21 2:58 PM

## 2021-01-26 ENCOUNTER — Inpatient Hospital Stay: Payer: Medicaid Other

## 2021-01-26 ENCOUNTER — Encounter: Payer: Self-pay | Admitting: *Deleted

## 2021-01-26 ENCOUNTER — Inpatient Hospital Stay (HOSPITAL_BASED_OUTPATIENT_CLINIC_OR_DEPARTMENT_OTHER): Payer: Medicaid Other | Admitting: Adult Health

## 2021-01-26 ENCOUNTER — Encounter: Payer: Self-pay | Admitting: Adult Health

## 2021-01-26 ENCOUNTER — Inpatient Hospital Stay: Payer: Medicaid Other | Attending: Oncology

## 2021-01-26 VITALS — BP 105/69 | HR 95 | Temp 98.4°F | Resp 18 | Ht 71.0 in | Wt 321.3 lb

## 2021-01-26 DIAGNOSIS — Z170 Estrogen receptor positive status [ER+]: Secondary | ICD-10-CM

## 2021-01-26 DIAGNOSIS — T451X5A Adverse effect of antineoplastic and immunosuppressive drugs, initial encounter: Secondary | ICD-10-CM | POA: Diagnosis not present

## 2021-01-26 DIAGNOSIS — C50412 Malignant neoplasm of upper-outer quadrant of left female breast: Secondary | ICD-10-CM

## 2021-01-26 DIAGNOSIS — G620 Drug-induced polyneuropathy: Secondary | ICD-10-CM | POA: Diagnosis not present

## 2021-01-26 DIAGNOSIS — Z79899 Other long term (current) drug therapy: Secondary | ICD-10-CM | POA: Insufficient documentation

## 2021-01-26 DIAGNOSIS — Z1509 Genetic susceptibility to other malignant neoplasm: Secondary | ICD-10-CM

## 2021-01-26 DIAGNOSIS — Z1501 Genetic susceptibility to malignant neoplasm of breast: Secondary | ICD-10-CM

## 2021-01-26 DIAGNOSIS — Z5112 Encounter for antineoplastic immunotherapy: Secondary | ICD-10-CM | POA: Insufficient documentation

## 2021-01-26 LAB — CBC WITH DIFFERENTIAL (CANCER CENTER ONLY)
Abs Immature Granulocytes: 0.02 10*3/uL (ref 0.00–0.07)
Basophils Absolute: 0 10*3/uL (ref 0.0–0.1)
Basophils Relative: 0 %
Eosinophils Absolute: 0.2 10*3/uL (ref 0.0–0.5)
Eosinophils Relative: 3 %
HCT: 38.4 % (ref 36.0–46.0)
Hemoglobin: 12.3 g/dL (ref 12.0–15.0)
Immature Granulocytes: 0 %
Lymphocytes Relative: 39 %
Lymphs Abs: 3 10*3/uL (ref 0.7–4.0)
MCH: 26.9 pg (ref 26.0–34.0)
MCHC: 32 g/dL (ref 30.0–36.0)
MCV: 83.8 fL (ref 80.0–100.0)
Monocytes Absolute: 0.3 10*3/uL (ref 0.1–1.0)
Monocytes Relative: 4 %
Neutro Abs: 4.2 10*3/uL (ref 1.7–7.7)
Neutrophils Relative %: 54 %
Platelet Count: 316 10*3/uL (ref 150–400)
RBC: 4.58 MIL/uL (ref 3.87–5.11)
RDW: 14.1 % (ref 11.5–15.5)
WBC Count: 7.8 10*3/uL (ref 4.0–10.5)
nRBC: 0 % (ref 0.0–0.2)

## 2021-01-26 LAB — CMP (CANCER CENTER ONLY)
ALT: 25 U/L (ref 0–44)
AST: 18 U/L (ref 15–41)
Albumin: 3.6 g/dL (ref 3.5–5.0)
Alkaline Phosphatase: 95 U/L (ref 38–126)
Anion gap: 10 (ref 5–15)
BUN: 13 mg/dL (ref 6–20)
CO2: 24 mmol/L (ref 22–32)
Calcium: 9.4 mg/dL (ref 8.9–10.3)
Chloride: 107 mmol/L (ref 98–111)
Creatinine: 0.8 mg/dL (ref 0.44–1.00)
GFR, Estimated: >60 mL/min (ref >60)
Glucose, Bld: 131 mg/dL — ABNORMAL HIGH (ref 70–99)
Potassium: 4 mmol/L (ref 3.5–5.1)
Sodium: 141 mmol/L (ref 135–145)
Total Bilirubin: <0.2 mg/dL — ABNORMAL LOW (ref 0.3–1.2)
Total Protein: 6.9 g/dL (ref 6.5–8.1)

## 2021-01-26 MED ORDER — ACETAMINOPHEN 325 MG PO TABS: 650.0000 mg | ORAL_TABLET | Freq: Once | ORAL | Status: DC

## 2021-01-26 MED ORDER — DIPHENHYDRAMINE HCL 25 MG PO CAPS
ORAL_CAPSULE | ORAL | Status: AC
  Filled 2021-01-26: qty 1

## 2021-01-26 MED ORDER — DIPHENHYDRAMINE HCL 25 MG PO CAPS: 25.0000 mg | ORAL_CAPSULE | Freq: Once | ORAL | Status: DC

## 2021-01-26 MED ORDER — SODIUM CHLORIDE 0.9 % IV SOLN
Freq: Once | INTRAVENOUS | Status: DC
  Filled 2021-01-26: qty 250

## 2021-01-26 MED ORDER — ACETAMINOPHEN 325 MG PO TABS
ORAL_TABLET | ORAL | Status: AC
  Filled 2021-01-26: qty 2

## 2021-01-26 MED ORDER — TRASTUZUMAB-HYALURONIDASE-OYSK 600-10000 MG-UNT/5ML ~~LOC~~ SOLN
600.0000 mg | Freq: Once | SUBCUTANEOUS | Status: AC
  Administered 2021-01-26: 600 mg via SUBCUTANEOUS
  Filled 2021-01-26: qty 5

## 2021-01-26 NOTE — Research (Signed)
Trial:  [id number] - TREATMENT OF ESTABLISHED CHEMOTHERAPY-INDUCED NEUROPATHY WITH N-PALMITOYLETHANOLAMIDE, A CANNABIMIMETIC NUTRACEUTICAL: A RANDOMIZED DOUBLE-BLIND PHASE II PILOT TRIAL  Patient Sheryl Mcmillan was identified by Dr. Jana Hakim as a potential candidate for the above listed study.  This Clinical Research Nurse met with Sheryl Mcmillan, [id number], on 01/26/21 in a manner and location that ensures patient privacy to discuss participation in the above listed research study.  Patient is Accompanied by her daughter .  A copy of the informed consent document with embedded HIPAA language was provided to the patient.  Patient reads, speaks, and understands [place].   Patient was provided with the business card of this Nurse and encouraged to contact the research team with any questions.  Approximately 10 minutes were spent with the patient reviewing the informed consent documents.  Patient was provided the option of taking informed consent documents home to review and was encouraged to review at their convenience with their support network, including other care providers. Patient took the consent documents home to review. Discussed that patient has upcoming surgery in 3 weeks and opioid pain medications are not allowed while taking the study drug. Patient felt that was good information to know and she plans she would probably wait until after her surgery to enroll on this study if she decides to participate.  Patient did report neuropathy symptoms in her toes for more than 3 months and rates the discomfort 7 out of 10 in the past week.  She is currently not taking any medication for the neuropathy.  Thanked patient for her time and interest in this study and encouraged her to call research nurse if any questions before her next follow up. She verbalized understanding.  Informed Thedore Mins, NP, that study was introduced but patient will likely wait until after her surgery to make a decision about  enrollment.  Foye Spurling, BSN, RN Clinical Research Nurse 01/26/2021 1:16 PM

## 2021-01-26 NOTE — Patient Instructions (Signed)
Olaparib tablets What is this medication? OLAPARIB (oh LA pa rib) is a chemotherapy drug. It targets specific enzymes within cancer cells and stops the cancer cell from growing. This medicine is used to treat certain kinds of ovarian cancer, breast cancer, pancreaticcancer, and prostate cancer. This medicine may be used for other purposes; ask your health care provider orpharmacist if you have questions. COMMON BRAND NAME(S): Lonie Peak What should I tell my care team before I take this medication? They need to know if you have any of these conditions: anemia kidney disease liver disease lung disease low blood counts, like low white cell, platelet, or red cell counts an unusual or allergic reaction to olaparib, other medicines, foods, dyes, or preservatives pregnant or trying to get pregnant breast-feeding How should I use this medication? Take this medicine by mouth with a glass of water. Follow the directions on the prescription label. Do not cut, crush, or chew this medicine. You can take it with or without food. If it upsets your stomach, take it with food. However, avoid grapefruit juice, grapefruit or [place] oranges while on this medicine. Take your medicine at regular intervals. Do not take it more often thandirected. Do not stop taking except on your doctor's advice. A special MedGuide will be given to you by the pharmacist with eachprescription and refill. Be sure to read this information carefully each time. Talk to your pediatrician regarding the use of this medicine in children.Special care may be needed. Overdosage: If you think you have taken too much of this medicine contact apoison control center or emergency room at once. NOTE: This medicine is only for you. Do not share this medicine with others. What if I miss a dose? If you miss a dose, take it as soon as you can. If it is almost time for yournext dose, take only that dose. Do not take double or extra doses. What may  interact with this medication? antiviral medicines for hepatitis, HIV or AIDS aprepitant boceprevir bosentan carbamazepine certain medicines for fungal infections like fluconazole, ketoconazole, itraconazole, posaconazole, and voriconazole certain medicines for infections, such as ciprofloxacin, clarithromycin, erythromycin, telithromycin crizotinib diltiazem grapefruit juice imatinib modafinil nafcillin nefazodone phenobarbital phenytoin rifampin [place] oranges [facility]. John's Wort telaprevir verapamil This list may not describe all possible interactions. Give your health care provider a list of all the medicines, herbs, non-prescription drugs, or dietary supplements you use. Also tell them if you smoke, drink alcohol, or use illegaldrugs. Some items may interact with your medicine. What should I watch for while using this medication? This drug may make you feel generally unwell. This is not uncommon, as chemotherapy can affect healthy cells as well as cancer cells. Report any side effects. Continue your course of treatment even though you feel ill unless your doctor tells you to stop. You will need blood work done while you are takingthis medicine. This medicine may increase your risk to bruise or bleed. Call your doctor orhealth care professional if you notice any unusual bleeding. Call your doctor or health care professional for advice if you get a fever, chills or sore throat, or other symptoms of a cold or flu. Do not treat yourself. This drug decreases your body's ability to fight infections. Try toavoid being around people who are sick. If you are going to have surgery or any other procedures, tell your doctor youare taking this medicine. Do not become pregnant while taking this medicine or for 6 months after the last dose. Women should inform their doctor if  they wish to become pregnant or think they might be pregnant. Men should not father a child while taking this medicine and for  3 months after stopping it. Do not donate sperm while taking this medicine and for 3 months after you stop taking this medicine. There is a potential for serious side effects to an unborn child. Talk to your health care professional or pharmacist for more information. Women who are able to become pregnant should use effective birth control during treatment and for at least 6 months after receiving the last dose. Talk to your healthcare provider about birth control methods that may be right for you. Do not breast-feed an infantwhile taking this medicine or for 1 month after the last dose. What side effects may I notice from receiving this medication? Side effects that you should report to your doctor or health care professionalas soon as possible: allergic reactions like skin rash, itching or hives, swelling of the face, lips, or tongue breathing problems, like shortness of breath, cough, or wheezing fever low blood counts - this medicine may decrease the number of white blood cells, red blood cells and platelets. You may be at increased risk for infections and bleeding. signs and symptoms of bleeding such as bloody or black, tarry stools; red or dark-brown urine; spitting up blood or brown material that looks like coffee grounds; red spots on the skin; unusual bruising or bleeding from the eye, gums, or nose signs and symptoms of a blood clot such as changes in vision; chest pain with breathing problems; severe, sudden headache; pain, swelling, warmth in the leg; trouble speaking; sudden numbness or weakness of the face, arm or leg signs and symptoms of low magnesium like muscle cramps or muscle pain; tingling or tremors; muscle weakness; seizures; or fast, irregular heartbeat signs and symptoms of infection like fever or chills; cough; sore throat; pain or trouble passing urine weak or tired Side effects that usually do not require medical attention (report to yourdoctor or health care professional if  they continue or are bothersome): changes in taste diarrhea headache heartburn, indigestion loss of appetite muscle or joint pain nausea/vomiting runny nose stomach pain weight loss This list may not describe all possible side effects. Call your doctor for medical advice about side effects. You may report side effects to FDA at1-800-FDA-1088. Where should I keep my medication? Keep out of the reach of children. Store between 20 and 25 degrees C (68 and 77 degrees F). Keep this medicine inthe original container. NOTE: This sheet is a summary. It may not cover all possible information. If you have questions about this medicine, talk to your doctor, pharmacist, orhealth care provider.  2022 Elsevier/Gold Standard (2018-11-14 14:59:48)

## 2021-01-26 NOTE — Patient Instructions (Signed)
[facility]   Discharge Instructions: Thank you for choosing [facility] to provide your oncology and hematology care.   If you have a lab appointment with the [facility], please go directly to the [facility] and check in at the registration area.   Wear comfortable clothing and clothing appropriate for easy access to any Portacath or PICC line.   We strive to give you quality time with your provider. You may need to reschedule your appointment if you arrive late (15 or more minutes).  Arriving late affects you and other patients whose appointments are after yours.  Also, if you miss three or more appointments without notifying the office, you may be dismissed from the clinic at the provider's discretion.      For prescription refill requests, have your pharmacy contact our office and allow 72 hours for refills to be completed.    Today you received the following chemotherapy and/or immunotherapy agents: Trastuzumab hyaluronidase (Herceptin hylecta)      To help prevent nausea and vomiting after your treatment, we encourage you to take your nausea medication as directed.  BELOW ARE SYMPTOMS THAT SHOULD BE REPORTED IMMEDIATELY: *FEVER GREATER THAN 100.4 F (38 C) OR HIGHER *CHILLS OR SWEATING *NAUSEA AND VOMITING THAT IS NOT CONTROLLED WITH YOUR NAUSEA MEDICATION *UNUSUAL SHORTNESS OF BREATH *UNUSUAL BRUISING OR BLEEDING *URINARY PROBLEMS (pain or burning when urinating, or frequent urination) *BOWEL PROBLEMS (unusual diarrhea, constipation, pain near the anus) TENDERNESS IN MOUTH AND THROAT WITH OR WITHOUT PRESENCE OF ULCERS (sore throat, sores in mouth, or a toothache) UNUSUAL RASH, SWELLING OR PAIN  UNUSUAL VAGINAL DISCHARGE OR ITCHING   Items with * indicate a potential emergency and should be followed up as soon as possible or go to the Emergency Department if any problems should occur.  Please show the CHEMOTHERAPY ALERT CARD or  IMMUNOTHERAPY ALERT CARD at check-in to the Emergency Department and triage nurse.  Should you have questions after your visit or need to cancel or reschedule your appointment, please contact [facility]  Dept: [phone]  and follow the prompts.  Office hours are 8:00 a.m. to 4:30 p.m. Monday - Friday. Please note that voicemails left after 4:00 p.m. may not be returned until the following business day.  We are closed weekends and major holidays. You have access to a nurse at all times for urgent questions. Please call the main number to the clinic Dept: [phone] and follow the prompts.   For any non-urgent questions, you may also contact your provider using MyChart. We now offer e-Visits for anyone 44 and older to request care online for non-urgent symptoms. For details visit [email or web address].   Also download the MyChart app! Go to the app store, search "MyChart", open the app, select [facility], and log in with your MyChart username and password.  Due to Covid, a mask is required upon entering the hospital/clinic. If you do not have a mask, one will be given to you upon arrival. For doctor visits, patients may have 1 support person aged 44 or older with them. For treatment visits, patients cannot have anyone with them due to current Covid guidelines and our immunocompromised population.

## 2021-01-26 NOTE — Progress Notes (Signed)
[place]  Telephone:([phone]) [phone] Fax:(336) [phone]     ID: Sheryl Mcmillan DOB: 1976/04/19  MR#: [id number]  EEF#:[id number]  Patient Care Team: Windell Hummingbird, PA-C as PCP - General (Physician Assistant) [name], RN as Oncology Nurse Navigator [name], RN as Oncology Nurse Navigator [name], MD as Consulting Physician (General Surgery) Magrinat, [name], MD as Consulting Physician (Oncology) [name], MD as Consulting Physician (Radiation Oncology) [name], MD as Consulting Physician (Plastic Surgery) Scot Dock, NP OTHER MD:  CHIEF COMPLAINT: estrogen receptor negative, Her2 positive breast cancer (s/Mcmillan bilateral mastectomies); BRCA1+  CURRENT TREATMENT: Adjuvant immunotherapy   INTERVAL HISTORY: Sheryl Mcmillan returns today for follow up and treatment of her estrogen receptor negative, Her2 positive breast cancer accompanied by her daughter (who incidentally is BRCA negative)  Sheryl Mcmillan is now receiving trastuzumab alone, to complete a year.  Her port was removed and she is receiving this subcutaneously.  She tolerates this well.    Since her last visit, she underwent repeat echocardiogram on 01/18/2021 showing an ejection fraction of 60-65%. This is only slightly reduced from prior in 08/2020.  She is scheduled for risk-reducing hysterectomy with BSO on 02/14/2021 under Dr. Denman George.   REVIEW OF SYSTEMS: Review of Systems  Constitutional:  Negative for appetite change, chills, fatigue, fever and unexpected weight change.  HENT:   Negative for hearing loss, lump/mass and trouble swallowing.   Eyes:  Negative for eye problems and icterus.  Respiratory:  Negative for chest tightness, cough and shortness of breath.   Cardiovascular:  Negative for chest pain, leg swelling and palpitations.  Gastrointestinal:  Negative for abdominal distention, abdominal pain, constipation, diarrhea, nausea and vomiting.  Endocrine: Negative for hot  flashes.  Genitourinary:  Negative for difficulty urinating.   Musculoskeletal:  Negative for arthralgias.  Skin:  Negative for itching and rash.  Neurological:  Positive for numbness. Negative for dizziness, extremity weakness and headaches.  Hematological:  Negative for adenopathy. Does not bruise/bleed easily.  Psychiatric/Behavioral:  Negative for depression. The patient is not nervous/anxious.       COVID 19 VACCINATION STATUS: Status post vaccine x2, no booster as of February 2022   HISTORY OF CURRENT ILLNESS: From the original intake note:   Sheryl Mcmillan presented with a palpable lump in the outer left breast, which she initially felt approximately 6 months prior. She waited to seek medical attention because she had no insurance.  Eventually she enrolled in the [facility] program and underwent bilateral diagnostic mammography with tomography and left breast ultrasonography at [facility] on 05/10/2020 showing: breast density category B; vaguely palpable 2.5 cm mass in left breast at 1:30; no pathologic left axillary lymphadenopathy or evidence of right breast malignancy.  Accordingly on 05/18/2020 she proceeded to biopsy of the left breast area in question. The pathology from this procedure ([id number]) showed: invasive ductal carcinoma, grade 3. Prognostic indicators significant for: estrogen receptor, 40% positive with weak staining intensity and progesterone receptor, 0% negative. Proliferation marker Ki67 at 50%. HER2 positive by immunohistochemistry (3+).  The patient's subsequent history is as detailed below.   PAST MEDICAL HISTORY: Past Medical History:  Diagnosis Date   Arthritis    Breast cancer ([place])    Family history of non-Hodgkin's lymphoma 06/01/2020   Hemorrhoids    Migraines    PCOS (polycystic ovarian syndrome)     PAST SURGICAL HISTORY: Past Surgical History:  Procedure Laterality Date   AXILLARY SENTINEL NODE BIOPSY Left 11/07/2020  Procedure: LEFT  AXILLARY SENTINEL NODE BIOPSY;  Surgeon: [name], MD;  Location: [facility];  Service: General;  Laterality: Left;   BREAST RECONSTRUCTION WITH PLACEMENT OF TISSUE EXPANDER AND FLEX HD (ACELLULAR HYDRATED DERMIS) Bilateral 11/07/2020   Procedure: BILATERAL BREAST RECONSTRUCTION WITH PLACEMENT OF TISSUE EXPANDER AND FLEX HD (ACELLULAR HYDRATED DERMIS);  Surgeon: [name], MD;  Location: [facility];  Service: Plastics;  Laterality: Bilateral;   COSMETIC SURGERY     DILATION AND CURETTAGE OF UTERUS     HEMORRHOID SURGERY     IR IMAGING GUIDED PORT INSERTION  06/15/2020   PORT-A-CATH REMOVAL Right 11/07/2020   Procedure: REMOVAL PORT-A-CATH;  Surgeon: [name], MD;  Location: [facility];  Service: General;  Laterality: Right;   TOTAL MASTECTOMY Bilateral 11/07/2020   Procedure: BILATERAL MASTECTOMY;  Surgeon: [name], MD;  Location: [facility];  Service: General;  Laterality: Bilateral;    FAMILY HISTORY: Family History  Problem Relation Age of Onset   Hypertension Mother    Diabetes Mother    Cancer Mother 53       unknown primary   Other Mother        brain tumor; dx 39s   Non-Hodgkin's lymphoma Brother 64   Other Maternal Uncle 104       brain tumor   Breast cancer Other        MGM's niece; dx late 22s   Pancreatic cancer Neg Hx    Colon cancer Neg Hx    Endometrial cancer Neg Hx    Prostate cancer Neg Hx    Ovarian cancer Neg Hx    She has no information on her father. Her mother died at age 57 from stage IV cancer. The primary cancer was unknown, but it was not breast.  Sheryl Mcmillan has two half brothers (through her mother), one of which was diagnosed with non-Hodgkin's lymphoma. There is no family history of breast, ovarian, or prostate cancer to her knowledge. // Patient is BRCA1 positive. Daughter tested negative.   GYNECOLOGIC HISTORY:  No LMP recorded. (Menstrual status: Chemotherapy). Menarche: 45 years old Age at first live birth: 45 years old Sheryl Mcmillan 1 LMP  05/02/2020 Contraceptive: never used HRT n/a  Hysterectomy? no BSO? 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 She is single. Her significant other Sheryl Mcmillan is a Freight forwarder. She lives at home with Sheryl Mcmillan, her daughter Sheryl Mcmillan,  [name]'s daughter Sheryl Mcmillan and her 67-monthold daughter Sheryl Mcmillan Daughter [name] age 44 is a bChief Operating Officerat [employer] [place] [employer]    ADVANCED DIRECTIVES: Completed and notarized 09/07/2020.  She has named her aunt TMarveen Reeksas healthcare power of attorney   HEALTH MAINTENANCE: Social History   Tobacco Use   Smoking status: Former    Packs/day: 0.50    Years: 10.00    Pack years: 5.00    Types: Cigarettes    Quit date: 08/23/2020    Years since quitting: 0.4   Smokeless tobacco: Never  Vaping Use   Vaping Use: Never used  Substance Use Topics   Alcohol use: Yes    Comment: occasionally   Drug use: Yes    Types: Marijuana    Comment: 5-7 days/week     Colonoscopy: 2018  PAP: 2018  Bone density: n/a (age)   Allergies  Allergen Reactions   Carboplatin Hives and Itching    Carboplatin reaction after dose #5  requiring Benadryl, Solumedrol, epinephrine, Claritin.   Other Hives    Baking Soda   Wound Dressing Adhesive Hives    Rips skin, prefers paper tape    Current Outpatient Medications  Medication Sig Dispense Refill   acetaminophen (TYLENOL) 650 MG CR tablet Take 650-1,300 mg by mouth every 8 (eight) hours as needed for pain.     aspirin-acetaminophen-caffeine (EXCEDRIN MIGRAINE) 250-250-65 MG tablet Take 2 tablets by mouth every 6 (six) hours as needed for headache.     COLLAGEN PO Take 1 tablet by mouth daily.     Multiple Vitamins-Minerals (ONE-A-DAY WOMENS PO) Take 1 tablet by mouth daily.     NON FORMULARY Apply 1 application topically 2 (two) times daily. Skinuva scar cream     omeprazole (PRILOSEC) 40 MG  capsule Take 40 mg by mouth daily as needed (acid reflux).     oxycodone (OXY-IR) 5 MG capsule Take 5 mg by mouth every 4 (four) hours as needed for pain.     senna-docusate (SENOKOT-S) 8.6-50 MG tablet Take 2 tablets by mouth at bedtime. For AFTER surgery only, do not take if having diarrhea 30 tablet 0   No current facility-administered medications for this visit.    OBJECTIVE: White woman who appears stated age 28:   01/26/21 1231  BP: 105/69  Pulse: 95  Resp: 18  Temp: 98.4 F (36.9 C)  SpO2: 98%    Body mass index is 44.81 kg/m. Filed Weights   01/26/21 1231  Weight: (!) 321 lb 4.8 oz (145.7 kg)   GENERAL: Patient is a well appearing female in no acute distress HEENT:  Sclerae anicteric.  Oropharynx clear and moist. No ulcerations or evidence of oropharyngeal candidiasis. Neck is supple.  NODES:  No cervical, supraclavicular, or axillary lymphadenopathy palpated.  BREAST EXAM:  s/Mcmillan bilateral mastectomies with expander placement, no sign of infection, no sign of recurrence. LUNGS:  Clear to auscultation bilaterally.  No wheezes or rhonchi. HEART:  Regular rate and rhythm. No murmur appreciated. ABDOMEN:  Soft, nontender.  Positive, normoactive bowel sounds. No organomegaly palpated. MSK:  No focal spinal tenderness to palpation. Full range of motion bilaterally in the upper extremities. EXTREMITIES:  No peripheral edema.   SKIN:  Clear with no obvious rashes or skin changes. No nail dyscrasia. NEURO:  Nonfocal. Well oriented.  Appropriate affect.    LAB RESULTS:  CMP     Component Value Date/Time   NA 139 01/05/2021 0754   K 4.0 01/05/2021 0754   CL 107 01/05/2021 0754   CO2 22 01/05/2021 0754   GLUCOSE 157 (H) 01/05/2021 0754   BUN 15 01/05/2021 0754   CREATININE 0.86 01/05/2021 0754   CALCIUM 8.9 01/05/2021 0754   PROT 6.9 01/05/2021 0754   ALBUMIN 3.5 01/05/2021 0754   AST 15 01/05/2021 0754   ALT 20 01/05/2021 0754   ALKPHOS 85 01/05/2021 0754    BILITOT <0.2 (L) 01/05/2021 0754   GFRNONAA >60 01/05/2021 0754   GFRAA >60 06/06/2019 1205    No results found for: TOTALPROTELP, ALBUMINELP, A1GS, A2GS, BETS, BETA2SER, GAMS, MSPIKE, SPEI  Lab Results  Component Value Date   WBC 7.8 01/26/2021   NEUTROABS 4.2 01/26/2021   HGB 12.3 01/26/2021   HCT 38.4 01/26/2021   MCV 83.8 01/26/2021   PLT 316 01/26/2021    No results found for: LABCA2  No components found for: WERXVQ008  No results for input(s): INR in the last 168 hours.  No results found for: LABCA2  No  results found for: CAN199  Lab Results  Component Value Date   CAN125 7.4 12/15/2020    No results found for: EPP295  No results found for: CA2729  No components found for: HGQUANT  No results found for: CEA1 / No results found for: CEA1   No results found for: AFPTUMOR  No results found for: CHROMOGRNA  No results found for: KPAFRELGTCHN, LAMBDASER, KAPLAMBRATIO (kappa/lambda light chains)  No results found for: HGBA, HGBA2QUANT, HGBFQUANT, HGBSQUAN (Hemoglobinopathy evaluation)   No results found for: LDH  No results found for: IRON, TIBC, IRONPCTSAT (Iron and TIBC)  No results found for: FERRITIN  Urinalysis    Component Value Date/Time   COLORURINE YELLOW 06/06/2019 [place] 06/06/2019 1205   LABSPEC >1.030 (H) 06/06/2019 1205   PHURINE 5.5 06/06/2019 1205   GLUCOSEU NEGATIVE 06/06/2019 1205   HGBUR TRACE (A) 06/06/2019 1205   BILIRUBINUR NEGATIVE 06/06/2019 1205   KETONESUR NEGATIVE 06/06/2019 1205   PROTEINUR NEGATIVE 06/06/2019 1205   UROBILINOGEN 1.0 05/09/2011 1805   NITRITE NEGATIVE 06/06/2019 1205   LEUKOCYTESUR NEGATIVE 06/06/2019 1205    STUDIES: ECHOCARDIOGRAM COMPLETE  Result Date: 01/18/2021    ECHOCARDIOGRAM REPORT   Patient Name:   ZAURIA DOMBEK Date of Exam: 01/18/2021 Medical Rec #:  [id number]       Height:       71.0 in Accession #:    [id number]      Weight:       321.0 lb Date of Birth:   [birth year]        BSA:          2.578 m Patient Age:    69 years        BP:           118/76 mmHg Patient Gender: F               HR:           86 bpm. Exam Location:  Outpatient Procedure: 2D Echo, Cardiac Doppler, Color Doppler and Intracardiac            Opacification Agent Indications:    Z51.11 Encounter for antineoplastic chemotheraphy  History:        Patient has prior history of Echocardiogram examinations, most                 recent 09/14/2020. Breast Cancer.  Sonographer:    Jonelle Sidle Dance Referring Phys: [place]  1. Left ventricular ejection fraction, by estimation, is 60 to 65%. The left ventricle has normal function. The left ventricle has no regional wall motion abnormalities. There is mild concentric left ventricular hypertrophy. Left ventricular diastolic parameters were normal.  2. Right ventricular systolic function is normal. The right ventricular size is normal.  3. The mitral valve is grossly normal. No evidence of mitral valve regurgitation. No evidence of mitral stenosis.  4. The aortic valve was not well visualized. Aortic valve regurgitation is not visualized. No aortic stenosis is present. Comparison(s): Compared to prior TTE in 08/2020, there is no significant change. LVEF remains normal. FINDINGS  Left Ventricle: Left ventricular ejection fraction, by estimation, is 60 to 65%. The left ventricle has normal function. The left ventricle has no regional wall motion abnormalities. Definity contrast agent was given IV to delineate the left ventricular  endocardial borders. The left ventricular internal cavity size was normal in size. There is mild concentric left ventricular hypertrophy. Left ventricular diastolic parameters were normal. Right  Ventricle: The right ventricular size is normal. Right vetricular wall thickness was not well visualized. Right ventricular systolic function is normal. Left Atrium: Left atrial size was normal in size. Right Atrium: Right atrial  size was normal in size. Pericardium: There is no evidence of pericardial effusion. Mitral Valve: The mitral valve is grossly normal. No evidence of mitral valve regurgitation. No evidence of mitral valve stenosis. Tricuspid Valve: The tricuspid valve is normal in structure. Tricuspid valve regurgitation is trivial. Aortic Valve: The aortic valve was not well visualized. Aortic valve regurgitation is not visualized. No aortic stenosis is present. Pulmonic Valve: The pulmonic valve was not well visualized. Pulmonic valve regurgitation is trivial. Aorta: The aortic root is normal in size and structure. IAS/Shunts: No atrial level shunt detected by color flow Doppler.  LEFT VENTRICLE PLAX 2D LVIDd:         5.00 cm  Diastology LVIDs:         3.40 cm  LV e' medial:    7.40 cm/s LV PW:         1.20 cm  LV E/e' medial:  12.6 LV IVS:        1.20 cm  LV e' lateral:   10.40 cm/s LVOT diam:     2.00 cm  LV E/e' lateral: 9.0 LV SV:         66 LV SV Index:   26 LVOT Area:     3.14 cm  RIGHT VENTRICLE             IVC RV Basal diam:  2.90 cm     IVC diam: 1.80 cm RV S prime:     13.60 cm/s TAPSE (M-mode): 2.4 cm LEFT ATRIUM             Index       RIGHT ATRIUM           Index LA diam:        4.70 cm 1.82 cm/m  RA Area:     16.10 cm LA Vol (A2C):   70.7 ml 27.42 ml/m RA Volume:   39.00 ml  15.13 ml/m LA Vol (A4C):   66.6 ml 25.83 ml/m LA Biplane Vol: 68.8 ml 26.69 ml/m  AORTIC VALVE LVOT Vmax:   127.00 cm/s LVOT Vmean:  83.400 cm/s LVOT VTI:    0.210 m  AORTA Ao Root diam: 3.40 cm Ao Asc diam:  3.30 cm MITRAL VALVE MV Area (PHT): 4.33 cm    SHUNTS MV Decel Time: 175 msec    Systemic VTI:  0.21 m MV E velocity: 93.60 cm/s  Systemic Diam: 2.00 cm MV A velocity: 76.50 cm/s MV E/A ratio:  1.22 Sheryl Kaufman MD Electronically signed by Sheryl Kaufman MD Signature Date/Time: 01/18/2021/12:33:49 PM    Final      ELIGIBLE FOR AVAILABLE RESEARCH PROTOCOL: AET  ASSESSMENT: 45 y.o. BRCA positive [place] woman status post  left breast upper outer quadrant biopsy 05/18/2020 for a clinical T2N0, stage IIA invasive ductal carcinoma, grade 3, estrogen receptor weakly positive, progesterone receptor negative, with an MIB-1 of 50%, and HER-2 positive by immunohistochemistry  (1) genetics testing 06/16/2020 shows a pathogenic mutation in BRCA1 at c.2475del (Mcmillan.Asp825Glufs*21)  (a)  Variant of uncertain significance in MSH3 at c.2724A>G (Silent).  (b) no additional deleterious mutations through the Multi-Cancer Panel offered by [facility] including AIP, ALK, APC, ATM, AXIN2,BAP1,  BARD1, BLM, BMPR1A, BRCA1, BRCA2, BRIP1, CASR, CDC73, CDH1, CDK4, CDKN1B, CDKN1C, CDKN2A (p14ARF), CDKN2A (p16INK4a), CEBPA, CHEK2, CTNNA1,  DICER1, DIS3L2, EGFR (c.2369C>T, Mcmillan.Thr790Met variant only), EPCAM (Deletion/duplication testing only), FH, FLCN, GATA2, GPC3, GREM1 (Promoter region deletion/duplication testing only), HOXB13 (c.251G>A, Mcmillan.Gly84Glu), HRAS, KIT, MAX, MEN1, MET, MITF (c.952G>A, Mcmillan.Glu318Lys variant only), MLH1, MSH2, MSH3, MSH6, MUTYH, NBN, NF1, NF2, NTHL1, PALB2, PDGFRA, PHOX2B, PMS2, POLD1, POLE, POT1, PRKAR1A, PTCH1, PTEN, RAD50, RAD51C, RAD51D, RB1, RECQL4, RET, RNF43, RUNX1, SDHAF2, SDHA (sequence changes only), SDHB, SDHC, SDHD, SMAD4, SMARCA4, SMARCB1, SMARCE1, STK11, SUFU, TERC, TERT, TMEM127, TP53, TSC1, TSC2, VHL, WRN and WT1.   (2) neoadjuvant chemotherapy to consist of carboplatin and docetaxel together with trastuzumab and Pertuzumab every 3 weeks x 6, starting 06/16/2020, completed 09/28/2020   (a) pertuzumab omitted after cycle 1  (3) trastuzumab to be continued to complete 1 year (through December 2022)  (a) echo 06/09/2020 shows EF in the 60-65% range  (b) echo 09/14/2020 shows an ejection fraction in 65-70% range.  (4) status post bilateral mastectomies 11/07/2020 showing  (A) on the right, no evidence of malignancy  (B) on the left, residual yp T1c yp N0 invasive ductal carcinoma, grade 3, with negative margins, now  triple negative, with an MIB-1 of 15%.  (5) bilateral salpingo-oophorectomy planned for August 2022  (6) olaparib to start September 2022   PLAN:  Leahna is here today for f/u of her breast cancer.  She is doing quite well today and has no signs of recurrence.  She continues on Trastuzumab given every 3 weeks subcutaneously.  She will be due for a repeat echo in October, and I placed orders for that today.    Alycea will undergo TAH/BSO at the end of August, I have asked that they move the herceptin to 8/22 so that it will be given prior to her undergoing her surgery.  Talea and I discussed briefly the olaparib, and I gave her information about this, since she is due to start this after her TAH/BSO.    She also is having some peripheral neuropathy and will talk to our research team about enrolling in that clinical trial today.   She will reutrn in 3 weeks for labs and Herceptin [place] and in 6 weeks for labs, f/u and herceptin [place].     Total encounter time 21 minutes.  Wilber Bihari, NP 01/26/21 12:40 PM Medical Oncology and Hematology [facility] [place], [place] [address] Tel. [phone]    Fax. [phone]    *Total Encounter Time as defined by the Centers for Medicare and Medicaid Services includes, in addition to the face-to-face time of a patient visit (documented in the note above) non-face-to-face time: obtaining and reviewing outside history, ordering and reviewing medications, tests or procedures, care coordination (communications with other health care professionals or caregivers) and documentation in the medical record.

## 2021-01-27 ENCOUNTER — Encounter: Payer: Self-pay | Admitting: Rehabilitation

## 2021-02-01 ENCOUNTER — Encounter (HOSPITAL_COMMUNITY): Admission: RE | Admit: 2021-02-01 | Payer: Medicaid Other | Source: Ambulatory Visit

## 2021-02-02 ENCOUNTER — Ambulatory Visit: Payer: Medicaid Other

## 2021-02-02 ENCOUNTER — Other Ambulatory Visit: Payer: Self-pay

## 2021-02-02 DIAGNOSIS — M25611 Stiffness of right shoulder, not elsewhere classified: Secondary | ICD-10-CM

## 2021-02-02 DIAGNOSIS — C50412 Malignant neoplasm of upper-outer quadrant of left female breast: Secondary | ICD-10-CM

## 2021-02-02 DIAGNOSIS — Z483 Aftercare following surgery for neoplasm: Secondary | ICD-10-CM

## 2021-02-02 DIAGNOSIS — R293 Abnormal posture: Secondary | ICD-10-CM

## 2021-02-02 DIAGNOSIS — M79621 Pain in right upper arm: Secondary | ICD-10-CM

## 2021-02-02 DIAGNOSIS — M79622 Pain in left upper arm: Secondary | ICD-10-CM

## 2021-02-02 DIAGNOSIS — M25612 Stiffness of left shoulder, not elsewhere classified: Secondary | ICD-10-CM

## 2021-02-02 NOTE — Therapy (Signed)
[place], [place], [address] Phone: [phone]   Fax:  [phone]  Physical Therapy Treatment  Patient Details  Name: Sheryl Mcmillan MRN: [id number] Date of Birth: [birth year] Referring Provider (PT): Dr. [name]   Encounter Date: 02/02/2021   PT End of Session - 02/02/21 1114     Visit Number 16    Number of Visits 18    Date for PT Re-Evaluation 02/02/21    Authorization Type [employer] - automatically authorized for 12 visits; new approval 01/18/21-03/01/21 for 12 more visits    Authorization - Visit Number 16    Authorization - Number of Visits 24    PT Start Time 1006    PT Stop Time 1103    PT Time Calculation (min) 57 min    Activity Tolerance Patient tolerated treatment well    Behavior During Therapy [facility] for tasks assessed/performed             Past Medical History:  Diagnosis Date   Arthritis    Breast cancer ([place])    Family history of non-Hodgkin's lymphoma 06/01/2020   Hemorrhoids    Migraines    PCOS (polycystic ovarian syndrome)     Past Surgical History:  Procedure Laterality Date   AXILLARY SENTINEL NODE BIOPSY Left 11/07/2020   Procedure: LEFT AXILLARY SENTINEL NODE BIOPSY;  Surgeon: [name], MD;  Location: [facility];  Service: General;  Laterality: Left;   BREAST RECONSTRUCTION WITH PLACEMENT OF TISSUE EXPANDER AND FLEX HD (ACELLULAR HYDRATED DERMIS) Bilateral 11/07/2020   Procedure: BILATERAL BREAST RECONSTRUCTION WITH PLACEMENT OF TISSUE EXPANDER AND FLEX HD (ACELLULAR HYDRATED DERMIS);  Surgeon: [name], MD;  Location: [facility];  Service: Plastics;  Laterality: Bilateral;   COSMETIC SURGERY     DILATION AND CURETTAGE OF UTERUS     HEMORRHOID SURGERY     IR IMAGING GUIDED PORT INSERTION  06/15/2020   PORT-A-CATH REMOVAL Right 11/07/2020   Procedure: REMOVAL PORT-A-CATH;  Surgeon: [name], MD;  Location: [facility];  Service: General;   Laterality: Right;   TOTAL MASTECTOMY Bilateral 11/07/2020   Procedure: BILATERAL MASTECTOMY;  Surgeon: [name], MD;  Location: [facility];  Service: General;  Laterality: Bilateral;    There were no vitals filed for this visit.   Subjective Assessment - 02/02/21 1013     Subjective I'm doing really good. I don't feel any tightness with end range reaching anymore. I think I feel like I can stop coming now. I'm doing all my HEP at home too.    Pertinent History Patient was diagnosed on 05/10/2020 with left grade III invasive ductal carcinoma breast cancer. She had a bilateral mastectomy and left sentinel node biopsy (1 negative node) on 11/07/2020 It is ER positive, PR negative, and HER2 positive with a Ki67 of 50%. She underwent plastic surgery to her left arm at age 46 when her arm was severely cut in a glass door.    Patient Stated Goals Get arm moving normally and pain under control. Unable to wipe bottom.    Currently in Pain? No/denies                [facility] PT Assessment - 02/02/21 0001       AROM   Right Shoulder Flexion 156 Degrees    Right Shoulder ABduction 170 Degrees    Left Shoulder Flexion 154 Degrees    Left Shoulder ABduction 170 Degrees  L-DEX FLOWSHEETS - 02/02/21 1000       L-DEX LYMPHEDEMA SCREENING   Measurement Type Unilateral    L-DEX MEASUREMENT EXTREMITY Upper Extremity    POSITION  Standing    DOMINANT SIDE Right    At Risk Side Left    BASELINE SCORE (UNILATERAL) 0.9    L-DEX SCORE (UNILATERAL) -3.2    VALUE CHANGE (UNILAT) -4.1               Quick Dash - 02/02/21 0001     Open a tight or new jar No difficulty    Do heavy household chores (wash walls, wash floors) No difficulty    Carry a shopping bag or briefcase No difficulty    Wash your back No difficulty    Use a knife to cut food No difficulty    Recreational activities in which you take some force or impact through your arm, shoulder, or hand (golf,  hammering, tennis) No difficulty    During the past week, to what extent has your arm, shoulder or hand problem interfered with your normal social activities with family, friends, neighbors, or groups? Not at all    During the past week, to what extent has your arm, shoulder or hand problem limited your work or other regular daily activities Not at all    Arm, shoulder, or hand pain. None    Tingling (pins and needles) in your arm, shoulder, or hand None    Difficulty Sleeping No difficulty    DASH Score 0 %                    [facility] Adult PT Treatment/Exercise - 02/02/21 0001       Shoulder Exercises: Standing   Other Standing Exercises Back against wall/core engaged, head and shoulders against wall for bil UE 3 way raises with 2# for flexion scaption, and abduction x10 each      Shoulder Exercises: Pulleys   Flexion 2 minutes    ABduction 2 minutes      Shoulder Exercises: Therapy Ball   Flexion Both;10 reps   forward lean into end of stretch   ABduction Left;10 reps   same side lean into end of stretch     Manual Therapy   Myofascial Release Gently to Lt pectoralis near axilla where pt still palpably tighter    Passive ROM In Supine to Lt shoulder into flexion, abduction, and D2 to tolerance                         PT Long Term Goals - 02/02/21 1025       PT LONG TERM GOAL #1   Title Patient will demonstrate she has regained full shoulder ROM and function post opeeratively compared to baselines.    Baseline still with some pull; full A/ROM    Status Achieved      PT LONG TERM GOAL #2   Title Patient will increase bilateral shoulder active flexion to >/= 150 degrees for increased ease reaching overhead.    Baseline Rt ~150 and Lt ~130 degrees of P/ROM with stretching today-01/05/21; Rt 156 and Lt 154 degrees - 02/02/21    Status Achieved      PT LONG TERM GOAL #3   Title Patient will increase bilateral shoulder active abduction to >/= 140 degrees for  increased ease reaching overhead.    Baseline Rt ~140 and Lt ~110 degrees of P/ROM (tightness at bil pectoralis limits  end motionl)- 01/05/21; bil 170 degrees - 02/02/21    Status Achieved      PT LONG TERM GOAL #4   Title Patient will decrease her DASH score to </= 38.64 to achieve baseline arm function.    Baseline 54.55; 0 - 02/02/21    Status Achieved      PT LONG TERM GOAL #5   Title Patient will verbalize good understanding of lympehdema risk reduction practices.    Baseline No knowledge; pt has participated in the ABC Class and has no further questions reagrding lymphedema risk reduction-01/05/21    Status Achieved      PT LONG TERM GOAL #6   Title Patient will report she has decreased pain by >/= 50% with daily tasks to reduce need for medication and tolerate normal ADLs.    Baseline Pain currently varies from 5-10/10 with most daily tasks.; pt reports not needing pain medication any longer as her pain is much improved - 01/05/21    Status Achieved                   Plan - 02/02/21 1115     Clinical Impression Statement Pt has made excellent progress overall with this episode of care and she has met all goals. She will be placed on hold at this time until after her upcoming surgeries. She has a same day hysterectomy and gall bladder surgery on 02/14/21 then will have her bil implant exchange surgery that is yet TBD. She may need to resume physical therapy after the implant exchange due to excessive tissue being removed at her bil trunk areas. Pt will call us prn at that time.    Stability/Clinical Decision Making Stable/Uncomplicated    Rehab Potential Excellent    PT Frequency 2x / week    PT Duration 4 weeks    PT Treatment/Interventions ADLs/Self Care Home Management;Therapeutic exercise;Patient/family education;Manual techniques;Passive range of motion;Scar mobilization    PT Next Visit Plan Pt on hold after todays session until after her upcoming surgeries. Renewal will  be needed if she requires further PT after implant exchange.    PT Home Exercise Plan Post op shoulder ROM HEP; standing bil UE 3 way raises and supine scapular series    Consulted and Agree with Plan of Care Patient             Patient will benefit from skilled therapeutic intervention in order to improve the following deficits and impairments:  Postural dysfunction, Decreased range of motion, Decreased knowledge of precautions, Impaired UE functional use, Pain, Decreased scar mobility  Visit Diagnosis: Aftercare following surgery for neoplasm  Stiffness of left shoulder, not elsewhere classified  Stiffness of right shoulder, not elsewhere classified  Pain in right upper arm  Pain in left upper arm  Abnormal posture  Malignant neoplasm of upper-outer quadrant of left breast in female, estrogen receptor positive ([place])     Problem List Patient Active Problem List   Diagnosis Date Noted   Peripheral neuropathy due to chemotherapy ([place]) 10/20/2020   Morbid obesity with BMI of 40.0-44.9, adult ([place]) 07/19/2020   BRCA1 gene mutation positive 07/04/2020   Genetic testing 06/28/2020   Family history of non-Hodgkin's lymphoma 06/01/2020   Malignant neoplasm of upper-outer quadrant of left breast in female, estrogen receptor positive ([place]) 05/26/2020    Otelia Limes, PTA 02/02/2021, 11:23 AM  [place], [place], [address] Phone: [phone]   Fax:  [phone]  Name:  Sheryl Mcmillan MRN: [id number] Date of Birth: 1976/04/21

## 2021-02-03 ENCOUNTER — Other Ambulatory Visit: Payer: Self-pay

## 2021-02-03 ENCOUNTER — Ambulatory Visit (INDEPENDENT_AMBULATORY_CARE_PROVIDER_SITE_OTHER): Payer: Medicaid Other | Admitting: Surgical

## 2021-02-03 DIAGNOSIS — C50412 Malignant neoplasm of upper-outer quadrant of left female breast: Secondary | ICD-10-CM

## 2021-02-03 DIAGNOSIS — Z170 Estrogen receptor positive status [ER+]: Secondary | ICD-10-CM

## 2021-02-03 NOTE — Progress Notes (Signed)
Patient is a 45 year old female here for follow-up on bilateral breast reconstruction with placement of tissue expanders.  Patient is doing well, she is not having any issues.  She is interested in additional fill.  Chaperone present on exam On exam bilateral breast incisions are intact, no wounds are noted.  She does have a small suture protruding through the junction of the vertical limb and inframammary fold incision.  There is no erythema or cellulitic changes noted.  No foul odor is noted.  No drainage noted.  No subcutaneous fluid collection noted with palpation.   We placed injectable saline in the Expander using a sterile technique: Right: 100 cc for a total of 750 / 700 cc Left: 100 cc for a total of 750 / 700 cc  Recommend following up in 1 week for reevaluation and to discuss planning for surgery.  She reports she is scheduled to see Dr. [name] next week to discuss this.  I do not see any signs of infection on exam.

## 2021-02-06 ENCOUNTER — Ambulatory Visit: Payer: Self-pay | Admitting: Surgery

## 2021-02-06 NOTE — Patient Instructions (Addendum)
DUE TO COVID-19 ONLY ONE VISITOR IS ALLOWED TO COME WITH YOU AND STAY IN THE WAITING ROOM ONLY DURING PRE OP AND PROCEDURE.   **NO VISITORS ARE ALLOWED IN THE SHORT STAY AREA OR RECOVERY ROOM!!**         Your procedure is scheduled on: Tuesday, 02-14-21   Report to [facility] Main  Entrance    Report to admitting at 11:30 AM   Call this number if you have problems the morning of surgery [phone]   Follow a light diet day before surgery (avoid gas producing foods)   Do not eat food :After Midnight.   May have liquids until 10:30 AM day of surgery  CLEAR LIQUID DIET  Foods Allowed                                                                     Foods Excluded  Water, Black Coffee and tea, regular and decaf             liquids that you cannot  Plain Jell-O in any flavor  (No red)                                   see through such as: Fruit ices (not with fruit pulp)                                      milk, soups, orange juice              Iced Popsicles (No red)                                      All solid food                                   Apple juices Sports drinks like Gatorade (No red) Lightly seasoned clear broth or consume(fat free) Sugar, honey syrup      Oral Hygiene is also important to reduce your risk of infection.                                    Remember - BRUSH YOUR TEETH THE MORNING OF SURGERY WITH YOUR REGULAR TOOTHPASTE   Do NOT smoke after Midnight   Take these medicines the morning of surgery with A SIP OF WATER: Omeprazole                             You may not have any metal on your body including hair pins, jewelry, and body piercing             Do not wear make-up, lotions, powders, perfumes or deodorant  Do not wear nail polish including gel and S&S, artificial/acrylic nails, or any other type of covering on natural nails including finger and toenails. If  you have artificial nails, gel coating, etc. that needs to be  removed by a nail salon please have this removed prior to surgery or surgery may need to be canceled/ delayed if the surgeon/ anesthesia feels like they are unable to be safely monitored.   Do not shave  48 hours prior to surgery.     Do not bring valuables to the hospital. [place].   Contacts, dentures or bridgework may not be worn into surgery.   Patients discharged the day of surgery will not be allowed to drive home.  Special Instructions: Bring a copy of your healthcare power of attorney and living will documents         the day of surgery if you haven't scanned them in before.  Please read over the following fact sheets you were given: IF YOU HAVE QUESTIONS ABOUT YOUR PRE OP INSTRUCTIONS PLEASE CALL [facility] - Preparing for Surgery Before surgery, you can play an important role.  Because skin is not sterile, your skin needs to be as free of germs as possible.  You can reduce the number of germs on your skin by washing with CHG (chlorahexidine gluconate) soap before surgery.  CHG is an antiseptic cleaner which kills germs and bonds with the skin to continue killing germs even after washing. Please DO NOT use if you have an allergy to CHG or antibacterial soaps.  If your skin becomes reddened/irritated stop using the CHG and inform your nurse when you arrive at Short Stay. Do not shave (including legs and underarms) for at least 48 hours prior to the first CHG shower.  You may shave your face/neck.  Please follow these instructions carefully:  1.  Shower with CHG Soap the night before surgery and the  morning of surgery.  2.  If you choose to wash your hair, wash your hair first as usual with your normal  shampoo.  3.  After you shampoo, rinse your hair and body thoroughly to remove the shampoo.                             4.  Use CHG as you would any other liquid soap.  You can apply chg directly to the skin and wash.  Gently  with a scrungie or clean washcloth.  5.  Apply the CHG Soap to your body ONLY FROM THE NECK DOWN.   Do   not use on face/ open                           Wound or open sores. Avoid contact with eyes, ears mouth and   genitals (private parts).                       Wash face,  Genitals (private parts) with your normal soap.             6.  Wash thoroughly, paying special attention to the area where your    surgery  will be performed.  7.  Thoroughly rinse your body with warm water from the neck down.  8.  DO NOT shower/wash with your normal soap after using and rinsing off the CHG Soap.                9.  Pat yourself dry with a clean  towel.            10.  Wear clean pajamas.            11.  Place clean sheets on your bed the night of your first shower and do not  sleep with pets. Day of Surgery : Do not apply any lotions/deodorants the morning of surgery.  Please wear clean clothes to the hospital/surgery center.  FAILURE TO FOLLOW THESE INSTRUCTIONS MAY RESULT IN THE CANCELLATION OF YOUR SURGERY  PATIENT SIGNATURE_________________________________  NURSE SIGNATURE__________________________________  ________________________________________________________________________  WHAT IS A BLOOD TRANSFUSION? Blood Transfusion Information  A transfusion is the replacement of blood or some of its parts. Blood is made up of multiple cells which provide different functions. Red blood cells carry oxygen and are used for blood loss replacement. White blood cells fight against infection. Platelets control bleeding. Plasma helps clot blood. Other blood products are available for specialized needs, such as hemophilia or other clotting disorders. BEFORE THE TRANSFUSION  Who gives blood for transfusions?  Healthy volunteers who are fully evaluated to make sure their blood is safe. This is blood bank blood. Transfusion therapy is the safest it has ever been in the practice of medicine. Before blood is  taken from a donor, a complete history is taken to make sure that person has no history of diseases nor engages in risky social behavior (examples are intravenous drug use or sexual activity with multiple partners). The donor's travel history is screened to minimize risk of transmitting infections, such as malaria. The donated blood is tested for signs of infectious diseases, such as HIV and hepatitis. The blood is then tested to be sure it is compatible with you in order to minimize the chance of a transfusion reaction. If you or a relative donates blood, this is often done in anticipation of surgery and is not appropriate for emergency situations. It takes many days to process the donated blood. RISKS AND COMPLICATIONS Although transfusion therapy is very safe and saves many lives, the main dangers of transfusion include:  Getting an infectious disease. Developing a transfusion reaction. This is an allergic reaction to something in the blood you were given. Every precaution is taken to prevent this. The decision to have a blood transfusion has been considered carefully by your caregiver before blood is given. Blood is not given unless the benefits outweigh the risks. AFTER THE TRANSFUSION Right after receiving a blood transfusion, you will usually feel much better and more energetic. This is especially true if your red blood cells have gotten low (anemic). The transfusion raises the level of the red blood cells which carry oxygen, and this usually causes an energy increase. The nurse administering the transfusion will monitor you carefully for complications. HOME CARE INSTRUCTIONS  No special instructions are needed after a transfusion. You may find your energy is better. Speak with your caregiver about any limitations on activity for underlying diseases you may have. SEEK MEDICAL CARE IF:  Your condition is not improving after your transfusion. You develop redness or irritation at the intravenous  (IV) site. SEEK IMMEDIATE MEDICAL CARE IF:  Any of the following symptoms occur over the next 12 hours: Shaking chills. You have a temperature by mouth above 102 F (38.9 C), not controlled by medicine. Chest, back, or muscle pain. People around you feel you are not acting correctly or are confused. Shortness of breath or difficulty breathing. Dizziness and fainting. You get a rash or develop hives. You have a decrease in  urine output. Your urine turns a dark color or changes to pink, red, or brown. Any of the following symptoms occur over the next 10 days: You have a temperature by mouth above 102 F (38.9 C), not controlled by medicine. Shortness of breath. Weakness after normal activity. The white part of the eye turns yellow (jaundice). You have a decrease in the amount of urine or are urinating less often. Your urine turns a dark color or changes to pink, red, or brown. Document Released: 06/08/2000 Document Revised: 09/03/2011 Document Reviewed: 01/26/2008 [facility] Patient Information 2014 [place], [place].  _______________________________________________________________________

## 2021-02-06 NOTE — Progress Notes (Addendum)
COVID swab appointment: N/A  COVID Vaccine Completed: Yes x1 Date COVID Vaccine completed: Has received booster: COVID vaccine manufacturer:    Moderna     Date of COVID positive in last 90 days:  No  PCP - Windell Hummingbird, PA-C Cardiologist - N/A  Chest x-ray -  July 2022 EKG - N/A Stress Test - N/A ECHO - 01-18-21 epic Cardiac Cath - N/A Pacemaker/ICD device last checked: Spinal Cord Stimulator:  Sleep Study - N/A CPAP -   Fasting Blood Sugar - N/A Checks Blood Sugar _____ times a day  Blood Thinner Instructions:  N/A Aspirin Instructions: Last Dose:  Activity level:  Can go up a flight of stairs and perform activities of daily living without stopping and without symptoms of chest pain or shortness of breath.     Anesthesia review:  172/101 initial BP, 162/97 recheck.  Patient denies headaches, chest pain or SOB.  Has BP machine at home and will monitor until surgery.     159/91 prior to discharge.  Patient denies shortness of breath, fever, cough and chest pain at PAT appointment   Patient verbalized understanding of instructions that were given to them at the PAT appointment. Patient was also instructed that they will need to review over the PAT instructions again at home before surgery.

## 2021-02-07 ENCOUNTER — Encounter (HOSPITAL_COMMUNITY)
Admission: RE | Admit: 2021-02-07 | Discharge: 2021-02-07 | Disposition: A | Discharge status: Home / Self Care | Payer: Medicaid Other | Source: Ambulatory Visit | Attending: Gynecologic Oncology | Admitting: Gynecologic Oncology

## 2021-02-07 ENCOUNTER — Other Ambulatory Visit: Payer: Self-pay

## 2021-02-07 ENCOUNTER — Encounter (HOSPITAL_COMMUNITY): Payer: Self-pay

## 2021-02-07 DIAGNOSIS — Z01812 Encounter for preprocedural laboratory examination: Secondary | ICD-10-CM | POA: Insufficient documentation

## 2021-02-07 HISTORY — DX: Gastro-esophageal reflux disease without esophagitis: K21.9

## 2021-02-07 LAB — COMPREHENSIVE METABOLIC PANEL
ALT: 31 U/L (ref 0–44)
AST: 25 U/L (ref 15–41)
Albumin: 3.8 g/dL (ref 3.5–5.0)
Alkaline Phosphatase: 83 U/L (ref 38–126)
Anion gap: 9 (ref 5–15)
BUN: 9 mg/dL (ref 6–20)
CO2: 25 mmol/L (ref 22–32)
Calcium: 8.9 mg/dL (ref 8.9–10.3)
Chloride: 104 mmol/L (ref 98–111)
Creatinine, Ser: 0.68 mg/dL (ref 0.44–1.00)
GFR, Estimated: >60 mL/min (ref >60)
Glucose, Bld: 125 mg/dL — ABNORMAL HIGH (ref 70–99)
Potassium: 4 mmol/L (ref 3.5–5.1)
Sodium: 138 mmol/L (ref 135–145)
Total Bilirubin: 0.5 mg/dL (ref 0.3–1.2)
Total Protein: 6.9 g/dL (ref 6.5–8.1)

## 2021-02-07 LAB — CBC
HCT: 39 % (ref 36.0–46.0)
Hemoglobin: 12.3 g/dL (ref 12.0–15.0)
MCH: 27 pg (ref 26.0–34.0)
MCHC: 31.5 g/dL (ref 30.0–36.0)
MCV: 85.7 fL (ref 80.0–100.0)
Platelets: 293 10*3/uL (ref 150–400)
RBC: 4.55 MIL/uL (ref 3.87–5.11)
RDW: 14.4 % (ref 11.5–15.5)
WBC: 7.9 10*3/uL (ref 4.0–10.5)
nRBC: 0 % (ref 0.0–0.2)

## 2021-02-08 ENCOUNTER — Telehealth: Payer: Self-pay | Admitting: *Deleted

## 2021-02-08 ENCOUNTER — Ambulatory Visit: Payer: Medicaid Other | Admitting: Plastic Surgery

## 2021-02-08 ENCOUNTER — Telehealth: Payer: Self-pay | Admitting: Adult Health

## 2021-02-08 NOTE — Telephone Encounter (Signed)
Pt tested positive for Covid on 02/06/2021.  She states symptoms started with diarrhea , she then developed coughing and vomiting that night. On Tuesday she states she had fever and chills.  Today she states symptoms in " really just head congestion"   Pt is scheduled for herceptin on 8/22 and then is to have GYN surgery on 02/14/2021.  This RN will inquire with MD about currently scheduled herceptin for 8/22 and how to proceed.  She states she has placed a call to GYN per current status.  This note will be forwarded to MD as well as to the [facility] per surgery is under Dr Denman George.

## 2021-02-08 NOTE — Telephone Encounter (Signed)
Patient called and stated she tested positive for COVID on Monday, August 15. Patient stated she was congested. Sent message to provider regarding upcoming appointment.

## 2021-02-08 NOTE — Telephone Encounter (Signed)
-----   Message from [name] sent at 02/08/2021  9:18 AM EDT ----- Regarding: COVID Positive Hello  This patient tested positive for COVID-19 on Monday, August 15 and still has congestion. She has an infusion on Monday, August 22.  Sheryl Mcmillan

## 2021-02-08 NOTE — Telephone Encounter (Signed)
PC to patient, informed her surgery is planned for September 20.  Time & other details to follow closer to this date.  Patient verbalizes understanding.

## 2021-02-08 NOTE — Telephone Encounter (Signed)
Received PC from patient - she states she tested positive for covid yesterday.  She said she had pre op testing done for upcoming surgery yesterday but did not have covid test.  She states she has had sinus congestion since Monday so she did a home test, which was positive.  Patient states her only symptom has been sinus congestion, denies fever or SOB.  Scheduled for surgery on 02/14/21.  M. Cross NP informed.

## 2021-02-08 NOTE — Telephone Encounter (Signed)
PC to patient, informed her she needs to wait 10 days after positive covid test.  If she has no symptoms at 10 days she can be put back on the OR schedule.  However, the issue is now availability for both surgeons.  Surgery will likely be the end of September depending on Dr. Earlie Server schedule.  Patient verbalizes understanding.

## 2021-02-09 ENCOUNTER — Ambulatory Visit: Payer: Medicaid Other

## 2021-02-09 ENCOUNTER — Telehealth: Payer: Self-pay | Admitting: Oncology

## 2021-02-09 ENCOUNTER — Other Ambulatory Visit: Payer: Self-pay | Admitting: Oncology

## 2021-02-09 ENCOUNTER — Other Ambulatory Visit: Payer: Medicaid Other

## 2021-02-09 NOTE — Telephone Encounter (Signed)
Cancelled appt per 8/18 sch msg. Pt aware.

## 2021-02-13 ENCOUNTER — Ambulatory Visit: Payer: Self-pay

## 2021-02-13 ENCOUNTER — Inpatient Hospital Stay: Payer: Medicaid Other

## 2021-02-14 DIAGNOSIS — Z1501 Genetic susceptibility to malignant neoplasm of breast: Secondary | ICD-10-CM

## 2021-02-14 LAB — TYPE AND SCREEN
ABO/RH(D): A POS
Antibody Screen: NEGATIVE

## 2021-02-16 ENCOUNTER — Other Ambulatory Visit: Payer: Medicaid Other

## 2021-02-16 ENCOUNTER — Ambulatory Visit: Payer: Medicaid Other

## 2021-02-23 ENCOUNTER — Ambulatory Visit (INDEPENDENT_AMBULATORY_CARE_PROVIDER_SITE_OTHER): Payer: Medicaid Other | Admitting: Plastic Surgery

## 2021-02-23 ENCOUNTER — Other Ambulatory Visit: Payer: Self-pay

## 2021-02-23 DIAGNOSIS — Z170 Estrogen receptor positive status [ER+]: Secondary | ICD-10-CM

## 2021-02-23 DIAGNOSIS — C50412 Malignant neoplasm of upper-outer quadrant of left female breast: Secondary | ICD-10-CM

## 2021-02-23 NOTE — Progress Notes (Signed)
Patient presents postop to discuss further expansion of her expanders.  She currently has 750 cc and 700 cc expanders.  She feels like the expander on the left side has changed in shape and wants me to look at that.  On exam it appears that the left expander has flipped with the posterior aspect of it now being anterior.  I was able to rotated and flip it back around.  I was then able to locate the ports and I infiltrated 50 cc in each expander for current volume of 800 cc in both expanders.  I think we should stop filling at this point for her.  She pointed out again the excess axillary tissue that she has on both sides.  She has an upcoming hysterectomy and we did discuss planning her breast procedure for at least 6 weeks after that.  We will plan to exchange the expanders for gel implants, tailor the excess axillary skin, and can do fat grafting medially in the upper pole as well as long as unable to leave the medial aspect of the capsule intact which I suspect I can do.  We discussed the details of this and she is interested in moving forward with that plan.

## 2021-03-01 NOTE — Patient Instructions (Signed)
DUE TO COVID-19 ONLY ONE VISITOR IS ALLOWED TO COME WITH YOU AND STAY IN THE WAITING ROOM ONLY DURING PRE OP AND PROCEDURE DAY OF SURGERY IF YOU ARE GOING HOME AFTER SURGERY. IF YOU ARE SPENDING THE NIGHT 2 PEOPLE MAY VISIT WITH YOU IN YOUR PRIVATE ROOM AFTER SURGERY UNTIL VISITING  HOURS ARE OVER AT 800 PM AND THE 2 VISITORS CANNOT SPEND THE NIGHT.               Sheryl Mcmillan     Your procedure is scheduled on: 03/14/21   Report to [facility] Main  Entrance   Report to admitting at  11:15AM     Call this number if you have problems the morning of surgery [phone]   Eat a light diet the day before surgery Full Liquid Diet   Strained creamy soups Tea, Coffee- with cream or mild and sugar or honey  Juices- cranberry , grape and apple  Jello  Milkshakes  Pudding , custards  Popsicles  Water Plain ice cream f, frozen yogurt, sherbet, plain yogurt  Fruit ices and popsicles with no fruit pulp  Sugar, honey and syrups Clear broths  Boost, Ensure, Resource and other liquid supplements NO CARBONATED BEVERAGES      Remember: Do not eat food  :After Midnight the night before your surgery,     You may have clear liquids from midnight until 10:30 am    CLEAR LIQUID DIET   Foods Allowed                                                                     Foods Excluded  Coffee and tea, regular and decaf                            NO MILK OR CREAMER IN THE COFFEE  Plain Jell-O any favor except red or purple                                           see through such as: Fruit ices (not with fruit pulp)                                     milk, soups, orange juice  Iced Popsicles                                    All solid food Carbonated beverages, regular and diet                                    Cranberry, grape and apple juices Sports drinks like Gatorade Lightly seasoned clear broth or consume(fat free) Sugar    BRUSH YOUR TEETH MORNING OF SURGERY AND  RINSE YOUR MOUTH OUT, NO CHEWING GUM CANDY OR MINTS.     Take these medicines the morning of surgery with A SIP  OF WATER: Omeprazole                                You may not have any metal on your body including hair pins and              piercings  Do not wear jewelry, make-up, lotions, powders or perfumes, deodorant             Do not wear nail polish on your fingernails.  Do not shave  48 hours prior to surgery.                Do not bring valuables to the hospital. [place].  Contacts, dentures or bridgework may not be worn into surgery.  .     Patients discharged the day of surgery will not be allowed to drive home.  IF YOU ARE HAVING SURGERY AND GOING HOME THE SAME DAY, YOU MUST HAVE AN ADULT TO DRIVE YOU HOME AND BE WITH YOU FOR 24 HOURS. YOU MAY GO HOME BY TAXI OR UBER OR ORTHERWISE, BUT AN ADULT MUST ACCOMPANY YOU HOME AND STAY WITH YOU FOR 24 HOURS.  Name and phone number of your driver:  Special Instructions: N/A              Please read over the following fact sheets you were given: _____________________________________________________________________             [facility] - Preparing for Surgery Before surgery, you can play an important role.  Because skin is not sterile, your skin needs to be as free of germs as possible.  You can reduce the number of germs on your skin by washing with CHG (chlorahexidine gluconate) soap before surgery.  CHG is an antiseptic cleaner which kills germs and bonds with the skin to continue killing germs even after washing. Please DO NOT use if you have an allergy to CHG or antibacterial soaps.  If your skin becomes reddened/irritated stop using the CHG and inform your nurse when you arrive at Short Stay. Do not shave (including legs and underarms) for at least 48 hours prior to the first CHG shower.   Please follow these instructions carefully:  1.  Shower with CHG Soap the night before  surgery and the  morning of Surgery.  2.  If you choose to wash your hair, wash your hair first as usual with your  normal  shampoo.  3.  After you shampoo, rinse your hair and body thoroughly to remove the  shampoo.                            4.  Use CHG as you would any other liquid soap.  You can apply chg directly  to the skin and wash                       Gently with a scrungie or clean washcloth.  5.  Apply the CHG Soap to your body ONLY FROM THE NECK DOWN.   Do not use on face/ open                           Wound or open sores. Avoid contact with eyes, ears  mouth and genitals (private parts).                       Wash face,  Genitals (private parts) with your normal soap.             6.  Wash thoroughly, paying special attention to the area where your surgery  will be performed.  7.  Thoroughly rinse your body with warm water from the neck down.  8.  DO NOT shower/wash with your normal soap after using and rinsing off  the CHG Soap.             9.  Pat yourself dry with a clean towel.            10.  Wear clean pajamas.            11.  Place clean sheets on your bed the night of your first shower and do not  sleep with pets. Day of Surgery : Do not apply any lotions/deodorants the morning of surgery.  Please wear clean clothes to the hospital/surgery center.  FAILURE TO FOLLOW THESE INSTRUCTIONS MAY RESULT IN THE CANCELLATION OF YOUR SURGERY PATIENT SIGNATURE_________________________________  NURSE SIGNATURE__________________________________  ________________________________________________________________________   Sheryl Mcmillan  An incentive spirometer is a tool that can help keep your lungs clear and active. This tool measures how well you are filling your lungs with each breath. Taking long deep breaths may help reverse or decrease the chance of developing breathing (pulmonary) problems (especially infection) following: A long period of time when you are unable to move  or be active. BEFORE THE PROCEDURE  If the spirometer includes an indicator to show your best effort, your nurse or respiratory therapist will set it to a desired goal. If possible, sit up straight or lean slightly forward. Try not to slouch. Hold the incentive spirometer in an upright position. INSTRUCTIONS FOR USE  Sit on the edge of your bed if possible, or sit up as far as you can in bed or on a chair. Hold the incentive spirometer in an upright position. Breathe out normally. Place the mouthpiece in your mouth and seal your lips tightly around it. Breathe in slowly and as deeply as possible, raising the piston or the ball toward the top of the column. Hold your breath for 3-5 seconds or for as long as possible. Allow the piston or ball to fall to the bottom of the column. Remove the mouthpiece from your mouth and breathe out normally. Rest for a few seconds and repeat Steps 1 through 7 at least 10 times every 1-2 hours when you are awake. Take your time and take a few normal breaths between deep breaths. The spirometer may include an indicator to show your best effort. Use the indicator as a goal to work toward during each repetition. After each set of 10 deep breaths, practice coughing to be sure your lungs are clear. If you have an incision (the cut made at the time of surgery), support your incision when coughing by placing a pillow or rolled up towels firmly against it. Once you are able to get out of bed, walk around indoors and cough well. You may stop using the incentive spirometer when instructed by your caregiver.  RISKS AND COMPLICATIONS Take your time so you do not get dizzy or light-headed. If you are in pain, you may need to take or ask for pain medication before doing incentive spirometry. It is harder to take  a deep breath if you are having pain. AFTER USE Rest and breathe slowly and easily. It can be helpful to keep track of a log of your progress. Your caregiver can  provide you with a simple table to help with this. If you are using the spirometer at home, follow these instructions: [place] IF:  You are having difficultly using the spirometer. You have trouble using the spirometer as often as instructed. Your pain medication is not giving enough relief while using the spirometer. You develop fever of 100.5 F (38.1 C) or higher. SEEK IMMEDIATE MEDICAL CARE IF:  You cough up bloody sputum that had not been present before. You develop fever of 102 F (38.9 C) or greater. You develop worsening pain at or near the incision site. MAKE SURE YOU:  Understand these instructions. Will watch your condition. Will get help right away if you are not doing well or get worse. Document Released: 10/22/2006 Document Revised: 09/03/2011 Document Reviewed: 12/23/2006 [employer] [facility] Patient Information 2014 [place], [place].   ________________________________________________________________________

## 2021-03-02 ENCOUNTER — Encounter (HOSPITAL_COMMUNITY): Payer: Self-pay

## 2021-03-02 ENCOUNTER — Other Ambulatory Visit: Payer: Medicaid Other

## 2021-03-02 ENCOUNTER — Ambulatory Visit: Payer: Medicaid Other

## 2021-03-02 ENCOUNTER — Other Ambulatory Visit: Payer: Self-pay

## 2021-03-02 ENCOUNTER — Encounter (HOSPITAL_COMMUNITY)
Admission: RE | Admit: 2021-03-02 | Discharge: 2021-03-02 | Disposition: A | Discharge status: Home / Self Care | Payer: Medicaid Other | Source: Ambulatory Visit | Attending: Gynecologic Oncology | Admitting: Gynecologic Oncology

## 2021-03-02 DIAGNOSIS — Z01812 Encounter for preprocedural laboratory examination: Secondary | ICD-10-CM | POA: Diagnosis not present

## 2021-03-02 LAB — BASIC METABOLIC PANEL
Anion gap: 7 (ref 5–15)
BUN: 8 mg/dL (ref 6–20)
CO2: 25 mmol/L (ref 22–32)
Calcium: 9.2 mg/dL (ref 8.9–10.3)
Chloride: 108 mmol/L (ref 98–111)
Creatinine, Ser: 0.69 mg/dL (ref 0.44–1.00)
GFR, Estimated: >60 mL/min (ref >60)
Glucose, Bld: 121 mg/dL — ABNORMAL HIGH (ref 70–99)
Potassium: 4.2 mmol/L (ref 3.5–5.1)
Sodium: 140 mmol/L (ref 135–145)

## 2021-03-02 LAB — CBC
HCT: 41.9 % (ref 36.0–46.0)
Hemoglobin: 13.2 g/dL (ref 12.0–15.0)
MCH: 26.5 pg (ref 26.0–34.0)
MCHC: 31.5 g/dL (ref 30.0–36.0)
MCV: 84 fL (ref 80.0–100.0)
Platelets: 339 10*3/uL (ref 150–400)
RBC: 4.99 MIL/uL (ref 3.87–5.11)
RDW: 15.4 % (ref 11.5–15.5)
WBC: 8.2 10*3/uL (ref 4.0–10.5)
nRBC: 0 % (ref 0.0–0.2)

## 2021-03-02 LAB — TYPE AND SCREEN
ABO/RH(D): A POS
Antibody Screen: NEGATIVE

## 2021-03-02 NOTE — Progress Notes (Addendum)
COVID test Completed: NA   PCP - Dr. Windell [facility] 01/31/21 [facility] medical Cardiologist - none  Chest x-ray - no EKG - no Stress Test - no ECHO - 01/18/21-epic Cardiac Cath - no Pacemaker/ICD device last checked:NA  Sleep Study - no CPAP -   Fasting Blood Sugar - NA Checks Blood Sugar _____ times a day  Blood Thinner Instructions:NA Aspirin Instructions: Last Dose:  Anesthesia review: yes   Patient denies shortness of breath, fever, cough and chest pain at PAT appointment Yes. No SOB with activities. Bi lat mastectomy and reconstruction done 11/07/20. Lt arm restricted. Patient verbalized understanding of instructions that were given to them at the PAT appointment. Patient was also instructed that they will need to review over the PAT instructions again at home before surgery. yes

## 2021-03-09 ENCOUNTER — Other Ambulatory Visit: Payer: Medicaid Other

## 2021-03-09 ENCOUNTER — Other Ambulatory Visit: Payer: Self-pay

## 2021-03-09 ENCOUNTER — Ambulatory Visit: Payer: Medicaid Other

## 2021-03-09 ENCOUNTER — Inpatient Hospital Stay (HOSPITAL_BASED_OUTPATIENT_CLINIC_OR_DEPARTMENT_OTHER): Payer: Medicaid Other | Admitting: Adult Health

## 2021-03-09 ENCOUNTER — Encounter: Payer: Self-pay | Admitting: Adult Health

## 2021-03-09 ENCOUNTER — Inpatient Hospital Stay: Payer: Medicaid Other | Attending: Oncology

## 2021-03-09 ENCOUNTER — Inpatient Hospital Stay: Payer: Medicaid Other

## 2021-03-09 VITALS — BP 128/65 | HR 83 | Temp 97.7°F | Resp 20 | Wt 320.8 lb

## 2021-03-09 DIAGNOSIS — Z79899 Other long term (current) drug therapy: Secondary | ICD-10-CM | POA: Diagnosis not present

## 2021-03-09 DIAGNOSIS — C50412 Malignant neoplasm of upper-outer quadrant of left female breast: Secondary | ICD-10-CM | POA: Insufficient documentation

## 2021-03-09 DIAGNOSIS — Z5112 Encounter for antineoplastic immunotherapy: Secondary | ICD-10-CM | POA: Insufficient documentation

## 2021-03-09 DIAGNOSIS — Z1501 Genetic susceptibility to malignant neoplasm of breast: Secondary | ICD-10-CM

## 2021-03-09 DIAGNOSIS — Z170 Estrogen receptor positive status [ER+]: Secondary | ICD-10-CM

## 2021-03-09 LAB — CBC WITH DIFFERENTIAL (CANCER CENTER ONLY)
Abs Immature Granulocytes: 0.03 10*3/uL (ref 0.00–0.07)
Basophils Absolute: 0 10*3/uL (ref 0.0–0.1)
Basophils Relative: 0 %
Eosinophils Absolute: 0.3 10*3/uL (ref 0.0–0.5)
Eosinophils Relative: 3 %
HCT: 40.1 % (ref 36.0–46.0)
Hemoglobin: 12.6 g/dL (ref 12.0–15.0)
Immature Granulocytes: 0 %
Lymphocytes Relative: 39 %
Lymphs Abs: 3.5 10*3/uL (ref 0.7–4.0)
MCH: 25.9 pg — ABNORMAL LOW (ref 26.0–34.0)
MCHC: 31.4 g/dL (ref 30.0–36.0)
MCV: 82.3 fL (ref 80.0–100.0)
Monocytes Absolute: 0.4 10*3/uL (ref 0.1–1.0)
Monocytes Relative: 5 %
Neutro Abs: 4.8 10*3/uL (ref 1.7–7.7)
Neutrophils Relative %: 53 %
Platelet Count: 312 10*3/uL (ref 150–400)
RBC: 4.87 MIL/uL (ref 3.87–5.11)
RDW: 15.6 % — ABNORMAL HIGH (ref 11.5–15.5)
WBC Count: 9 10*3/uL (ref 4.0–10.5)
nRBC: 0 % (ref 0.0–0.2)

## 2021-03-09 LAB — CMP (CANCER CENTER ONLY)
ALT: 33 U/L (ref 0–44)
AST: 24 U/L (ref 15–41)
Albumin: 3.6 g/dL (ref 3.5–5.0)
Alkaline Phosphatase: 96 U/L (ref 38–126)
Anion gap: 9 (ref 5–15)
BUN: 8 mg/dL (ref 6–20)
CO2: 24 mmol/L (ref 22–32)
Calcium: 9.1 mg/dL (ref 8.9–10.3)
Chloride: 107 mmol/L (ref 98–111)
Creatinine: 0.82 mg/dL (ref 0.44–1.00)
GFR, Estimated: >60 mL/min (ref >60)
Glucose, Bld: 155 mg/dL — ABNORMAL HIGH (ref 70–99)
Potassium: 4 mmol/L (ref 3.5–5.1)
Sodium: 140 mmol/L (ref 135–145)
Total Bilirubin: 0.2 mg/dL — ABNORMAL LOW (ref 0.3–1.2)
Total Protein: 6.8 g/dL (ref 6.5–8.1)

## 2021-03-09 MED ORDER — TRASTUZUMAB-HYALURONIDASE-OYSK 600-10000 MG-UNT/5ML ~~LOC~~ SOLN
600.0000 mg | Freq: Once | SUBCUTANEOUS | Status: AC
  Administered 2021-03-09: 600 mg via SUBCUTANEOUS
  Filled 2021-03-09: qty 5

## 2021-03-09 NOTE — Progress Notes (Signed)
[place]  [phone] [phone]     ID: Sheryl Mcmillan DOB: 05-17-1976  MR#: [id number]  EUM#:[id number]  Patient Care Team: Windell Hummingbird, PA-C as PCP - General (Physician Assistant) [name], RN as Oncology Nurse Navigator [name], RN as Oncology Nurse Navigator [name], MD as Consulting Physician (General Surgery) Magrinat, [name], MD as Consulting Physician (Oncology) [name], MD as Consulting Physician (Radiation Oncology) [name], MD as Consulting Physician (Plastic Surgery) Scot Dock, NP OTHER MD:  CHIEF COMPLAINT: estrogen receptor negative, 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  Her port was removed and she is receiving this subcutaneously.  She tolerates this well.    Her most recent echo was completed on 7/37/2022 and was normal.  She was going to undergo TAH/BSO in August, however she was diagnosed with covid at that time and it was delayed to later this month.  She denies any new issues today.  She continues feeling well and has no concerns today.     REVIEW OF SYSTEMS: Review of Systems  Constitutional:  Negative for appetite change, chills, fatigue, fever and unexpected weight change.  HENT:   Negative for hearing loss, lump/mass and trouble swallowing.   Eyes:  Negative for eye problems and icterus.  Respiratory:  Negative for chest tightness, cough and shortness of breath.   Cardiovascular:  Negative for chest pain, leg swelling and palpitations.  Gastrointestinal:  Negative for abdominal distention, abdominal pain, constipation, diarrhea, nausea and  vomiting.  Endocrine: Negative for hot flashes.  Genitourinary:  Negative for difficulty urinating.   Musculoskeletal:  Negative for arthralgias.  Skin:  Negative for itching and rash.  Neurological:  Positive for numbness. Negative for dizziness, extremity weakness and headaches.  Hematological:  Negative for adenopathy. Does not bruise/bleed easily.  Psychiatric/Behavioral:  Negative for depression. The patient is not nervous/anxious.       COVID 19 VACCINATION STATUS: Status post vaccine x2, no booster as of February 2022   HISTORY OF CURRENT ILLNESS: From the original intake note:   Sheryl Mcmillan presented with a palpable lump in the outer left breast, which she initially felt approximately 6 months prior. She waited to seek medical attention because she had no insurance.  Eventually she enrolled in the [facility] program and underwent bilateral diagnostic mammography with tomography and left breast ultrasonography at [facility] on 05/10/2020 showing: breast density category B; vaguely palpable 2.5 cm mass in left breast at 1:30; no pathologic left axillary lymphadenopathy or evidence of right breast malignancy.  Accordingly on 05/18/2020 she proceeded to biopsy of the left breast area in question. The pathology from this procedure ([id number]) showed: invasive ductal carcinoma, grade 3. Prognostic indicators significant for: estrogen receptor, 40% positive with weak staining intensity and progesterone receptor, 0% negative. Proliferation marker Ki67 at 50%. HER2 positive by immunohistochemistry (3+).  The patient's subsequent history is as detailed below.   PAST MEDICAL HISTORY: Past Medical History:  Diagnosis Date   Arthritis    Breast cancer ([place])    Family history of non-Hodgkin's lymphoma 06/01/2020   GERD (gastroesophageal reflux disease)    Hemorrhoids    Migraines    PCOS (polycystic ovarian  syndrome)     PAST SURGICAL HISTORY: Past Surgical History:  Procedure  Laterality Date   AXILLARY SENTINEL NODE BIOPSY Left 11/07/2020   Procedure: LEFT AXILLARY SENTINEL NODE BIOPSY;  Surgeon: [name], MD;  Location: [place];  Service: General;  Laterality: Left;   BREAST RECONSTRUCTION WITH PLACEMENT OF TISSUE EXPANDER AND FLEX HD (ACELLULAR HYDRATED DERMIS) Bilateral 11/07/2020   Procedure: BILATERAL BREAST RECONSTRUCTION WITH PLACEMENT OF TISSUE EXPANDER AND FLEX HD (ACELLULAR HYDRATED DERMIS);  Surgeon: [name], MD;  Location: [place];  Service: Plastics;  Laterality: Bilateral;   COSMETIC SURGERY     DILATION AND CURETTAGE OF UTERUS     HEMORRHOID SURGERY     IR IMAGING GUIDED PORT INSERTION  06/15/2020   PORT-A-CATH REMOVAL Right 11/07/2020   Procedure: REMOVAL PORT-A-CATH;  Surgeon: [name], MD;  Location: [place];  Service: General;  Laterality: Right;   TOTAL MASTECTOMY Bilateral 11/07/2020   Procedure: BILATERAL MASTECTOMY;  Surgeon: [name], MD;  Location: [place];  Service: General;  Laterality: Bilateral;    FAMILY HISTORY: Family History  Problem Relation Age of Onset   Hypertension Mother    Diabetes Mother    Cancer Mother 23       unknown primary   Other Mother        brain tumor; dx 4s   Non-Hodgkin's lymphoma Brother 66   Other Maternal Uncle 20       brain tumor   Breast cancer Other        MGM's niece; dx late 63s   Pancreatic cancer Neg Hx    Colon cancer Neg Hx    Endometrial cancer Neg Hx    Prostate cancer Neg Hx    Ovarian cancer Neg Hx    She has no information on her father. Her mother died at age 62 from stage IV cancer. The primary cancer was unknown, but it was not breast.  Sheryl Mcmillan has two half brothers (through her mother), one of which was diagnosed with non-Hodgkin's lymphoma. There is no family history of breast, ovarian, or prostate cancer to her knowledge. // Patient is BRCA1 positive. Daughter tested negative.   GYNECOLOGIC HISTORY:  No LMP recorded. Menarche: 45 years old Age at  first live birth: 45 years old [place] P 1 LMP 05/02/2020 Contraceptive: never used HRT n/a  Hysterectomy? no BSO? 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 She is single. Her significant other [name] is a Freight forwarder. She lives at home with Sheryl Mcmillan, her daughter Sheryl Mcmillan,  [name]'s daughter Sheryl Mcmillan and her 99-monthold daughter Sheryl Mcmillan Daughter [name] age 44 is a bChief Operating Officerat [employer] [place] [employer]    ADVANCED DIRECTIVES: Completed and notarized 09/07/2020.  She has named her aunt TMarveen Reeksas healthcare power of attorney   HEALTH MAINTENANCE: Social History   Tobacco Use   Smoking status: Former    Packs/day: 0.50    Years: 10.00    Pack years: 5.00    Types: Cigarettes    Quit date: 08/23/2020    Years since quitting: 0.5   Smokeless tobacco: Never  Vaping Use   Vaping Use: Never used  Substance Use Topics   Alcohol use: Yes    Comment: rare   Drug use: Yes    Types: Marijuana    Comment: 5-7 days/week     Colonoscopy: 2018  PAP: 2018  Bone density:  n/a (age)   Allergies  Allergen Reactions   Carboplatin Hives and Itching    Carboplatin reaction after dose #5 requiring Benadryl, Solumedrol, epinephrine, Claritin.   Other Hives    Baking Soda   Wound Dressing Adhesive Hives    Rips skin, prefers paper tape    Current Outpatient Medications  Medication Sig Dispense Refill   acetaminophen (TYLENOL) 650 MG CR tablet Take 650-1,300 mg by mouth every 8 (eight) hours as needed for pain.     aspirin-acetaminophen-caffeine (EXCEDRIN MIGRAINE) 250-250-65 MG tablet Take 2 tablets by mouth every 6 (six) hours as needed for headache.     COLLAGEN PO Take 1 tablet by mouth daily.     Multiple Vitamins-Minerals (ONE-A-DAY WOMENS PO) Take 1 tablet by mouth daily.     NON FORMULARY Apply 1 application topically 2 (two) times daily. Skinuva scar cream      omeprazole (PRILOSEC) 40 MG capsule Take 40 mg by mouth daily as needed (acid reflux).     oxycodone (OXY-IR) 5 MG capsule Take 5 mg by mouth every 4 (four) hours as needed for pain. (Patient not taking: Reported on 02/23/2021)     senna-docusate (SENOKOT-S) 8.6-50 MG tablet Take 2 tablets by mouth at bedtime. For AFTER surgery only, do not take if having diarrhea 30 tablet 0   No current facility-administered medications for this visit.    OBJECTIVE: White woman who appears stated age 21:   03/09/21 1306  BP: 128/65  Pulse: 83  Resp: 20  Temp: 97.7 F (36.5 C)  SpO2: 98%    Body mass index is 44.74 kg/m. Filed Weights   03/09/21 1306  Weight: (!) 320 lb 12.8 oz (145.5 kg)   GENERAL: Patient is a well appearing female in no acute distress HEENT:  Sclerae anicteric.  Oropharynx clear and moist. No ulcerations or evidence of oropharyngeal candidiasis. Neck is supple.  NODES:  No cervical, supraclavicular, or axillary lymphadenopathy palpated.  BREAST EXAM:  s/p bilateral mastectomies with expander placement, no sign of infection, no sign of recurrence. LUNGS:  Clear to auscultation bilaterally.  No wheezes or rhonchi. HEART:  Regular rate and rhythm. No murmur appreciated. ABDOMEN:  Soft, nontender.  Positive, normoactive bowel sounds. No organomegaly palpated. MSK:  No focal spinal tenderness to palpation. Full range of motion bilaterally in the upper extremities. EXTREMITIES:  No peripheral edema.   SKIN:  Clear with no obvious rashes or skin changes. No nail dyscrasia. NEURO:  Nonfocal. Well oriented.  Appropriate affect.    LAB RESULTS:  CMP     Component Value Date/Time   NA 140 03/09/2021 1236   K 4.0 03/09/2021 1236   CL 107 03/09/2021 1236   CO2 24 03/09/2021 1236   GLUCOSE 155 (H) 03/09/2021 1236   BUN 8 03/09/2021 1236   CREATININE 0.82 03/09/2021 1236   CALCIUM 9.1 03/09/2021 1236   PROT 6.8 03/09/2021 1236   ALBUMIN 3.6 03/09/2021 1236   AST 24  03/09/2021 1236   ALT 33 03/09/2021 1236   ALKPHOS 96 03/09/2021 1236   BILITOT 0.2 (L) 03/09/2021 1236   GFRNONAA >60 03/09/2021 1236   GFRAA >60 06/06/2019 1205    No results found for: TOTALPROTELP, ALBUMINELP, A1GS, A2GS, BETS, BETA2SER, GAMS, MSPIKE, SPEI  Lab Results  Component Value Date   WBC 9.0 03/09/2021   NEUTROABS 4.8 03/09/2021   HGB 12.6 03/09/2021   HCT 40.1 03/09/2021   MCV 82.3 03/09/2021   PLT 312 03/09/2021    No results  found for: LABCA2  No components found for: BHALPF790  No results for input(s): INR in the last 168 hours.  No results found for: LABCA2  No results found for: CAN199  Lab Results  Component Value Date   CAN125 7.4 12/15/2020    No results found for: WIO973  No results found for: CA2729  No components found for: HGQUANT  No results found for: CEA1 / No results found for: CEA1   No results found for: AFPTUMOR  No results found for: CHROMOGRNA  No results found for: KPAFRELGTCHN, LAMBDASER, KAPLAMBRATIO (kappa/lambda light chains)  No results found for: HGBA, HGBA2QUANT, HGBFQUANT, HGBSQUAN (Hemoglobinopathy evaluation)   No results found for: LDH  No results found for: IRON, TIBC, IRONPCTSAT (Iron and TIBC)  No results found for: FERRITIN  Urinalysis    Component Value Date/Time   COLORURINE YELLOW 06/06/2019 [place] 06/06/2019 1205   LABSPEC >1.030 (H) 06/06/2019 1205   PHURINE 5.5 06/06/2019 [place] 06/06/2019 1205   HGBUR TRACE (A) 06/06/2019 [place] 06/06/2019 [place] 06/06/2019 [place] 06/06/2019 1205   UROBILINOGEN 1.0 05/09/2011 1805   NITRITE NEGATIVE 06/06/2019 [place] 06/06/2019 1205    STUDIES: No results found.   ELIGIBLE FOR AVAILABLE RESEARCH PROTOCOL: AET  ASSESSMENT: 45 y.o. BRCA positive [place] woman status post left breast upper outer quadrant biopsy 05/18/2020 for a  clinical T2N0, stage IIA invasive ductal carcinoma, grade 3, estrogen receptor weakly positive, progesterone receptor negative, with an MIB-1 of 50%, and HER-2 positive by immunohistochemistry  (1) genetics testing 06/16/2020 shows a pathogenic mutation in BRCA1 at c.2475del (p.Asp825Glufs*21)  (a)  Variant of uncertain significance in MSH3 at c.2724A>G (Silent).  (b) no additional deleterious mutations through the Multi-Cancer Panel offered by [facility] including AIP, ALK, APC, ATM, AXIN2,BAP1,  BARD1, BLM, BMPR1A, BRCA1, BRCA2, BRIP1, CASR, CDC73, CDH1, CDK4, CDKN1B, CDKN1C, CDKN2A (p14ARF), CDKN2A (p16INK4a), CEBPA, CHEK2, CTNNA1, DICER1, DIS3L2, EGFR (c.2369C>T, p.Thr790Met variant only), EPCAM (Deletion/duplication testing only), FH, FLCN, GATA2, GPC3, GREM1 (Promoter region deletion/duplication testing only), HOXB13 (c.251G>A, p.Gly84Glu), HRAS, KIT, MAX, MEN1, MET, MITF (c.952G>A, p.Glu318Lys variant only), MLH1, MSH2, MSH3, MSH6, MUTYH, NBN, NF1, NF2, NTHL1, PALB2, PDGFRA, PHOX2B, PMS2, POLD1, POLE, POT1, PRKAR1A, PTCH1, PTEN, RAD50, RAD51C, RAD51D, RB1, RECQL4, RET, RNF43, RUNX1, SDHAF2, SDHA (sequence changes only), SDHB, SDHC, SDHD, SMAD4, SMARCA4, SMARCB1, SMARCE1, STK11, SUFU, TERC, TERT, TMEM127, TP53, TSC1, TSC2, VHL, WRN and WT1.   (2) neoadjuvant chemotherapy to consist of carboplatin and docetaxel together with trastuzumab and Pertuzumab every 3 weeks x 6, starting 06/16/2020, completed 09/28/2020   (a) pertuzumab omitted after cycle 1  (3) trastuzumab to be continued to complete 1 year (through December 2022)  (a) echo 06/09/2020 shows EF in the 60-65% range  (b) echo 09/14/2020 shows an ejection fraction in 65-70% range.  (4) status post bilateral mastectomies 11/07/2020 showing  (A) on the right, no evidence of malignancy  (B) on the left, residual yp T1c yp N0 invasive ductal carcinoma, grade 3, with negative margins, now triple negative, with an MIB-1 of 15%.  (5) bilateral  salpingo-oophorectomy planned for August 2022  (A) Delayed to September due to Covid infection  (6) olaparib to start September 2022  (A) delaying to October due to the above delay secondary to covid infection   PLAN:  Jojo continues on Herceptin with good tolerance.  Her echos have remained up to date.  She has  no clinical signs of breast cancer recurrence.    Philip will proceed with her surgery with GYN-oncology later this month.  I recommended that she continue to follow a healthy diet and exercise.   She will reutrn in 3 weeks for labs and Herceptin [place] and in 6 weeks for labs, f/u and herceptin hycleta.     Total encounter time 20 minutes in face to face visit time, chart review, lab review, order entry, care coordination and documentation of the encounter.    Wilber Bihari, NP 03/09/21 1:24 PM Medical Oncology and Hematology [facility] [place], [place] [address] Tel. [phone]    Fax. [phone]    *Total Encounter Time as defined by the Centers for Medicare and Medicaid Services includes, in addition to the face-to-face time of a patient visit (documented in the note above) non-face-to-face time: obtaining and reviewing outside history, ordering and reviewing medications, tests or procedures, care coordination (communications with other health care professionals or caregivers) and documentation in the medical record.

## 2021-03-09 NOTE — Patient Instructions (Signed)
[facility]ICAL [facility]  Discharge Instructions: Thank you for choosing [facility] to provide your oncology and hematology care.   If you have a lab appointment with the Cancer Center, please go directly to the Cancer Center and check in at the registration area.   Wear comfortable clothing and clothing appropriate for easy access to any Portacath or PICC line.   We strive to give you quality time with your provider. You may need to reschedule your appointment if you arrive late (15 or more minutes).  Arriving late affects you and other patients whose appointments are after yours.  Also, if you miss three or more appointments without notifying the office, you may be dismissed from the clinic at the provider's discretion.      For prescription refill requests, have your pharmacy contact our office and allow 72 hours for refills to be completed.    Today you received the following chemotherapy and/or immunotherapy agents herceptin hylecta    To help prevent nausea and vomiting after your treatment, we encourage you to take your nausea medication as directed.  BELOW ARE SYMPTOMS THAT SHOULD BE REPORTED IMMEDIATELY: . *FEVER GREATER THAN 100.4 F (38 C) OR HIGHER . *CHILLS OR SWEATING . *NAUSEA AND VOMITING THAT IS NOT CONTROLLED WITH YOUR NAUSEA MEDICATION . *UNUSUAL SHORTNESS OF BREATH . *UNUSUAL BRUISING OR BLEEDING . *URINARY PROBLEMS (pain or burning when urinating, or frequent urination) . *BOWEL PROBLEMS (unusual diarrhea, constipation, pain near the anus) . TENDERNESS IN MOUTH AND THROAT WITH OR WITHOUT PRESENCE OF ULCERS (sore throat, sores in mouth, or a toothache) . UNUSUAL RASH, SWELLING OR PAIN  . UNUSUAL VAGINAL DISCHARGE OR ITCHING   Items with * indicate a potential emergency and should be followed up as soon as possible or go to the Emergency Department if any problems should occur.  Please show the CHEMOTHERAPY ALERT CARD or IMMUNOTHERAPY  ALERT CARD at check-in to the Emergency Department and triage nurse.  Should you have questions after your visit or need to cancel or reschedule your appointment, please contact [facility]  Dept: [phone]  and follow the prompts.  Office hours are 8:00 a.m. to 4:30 p.m. Monday - Friday. Please note that voicemails left after 4:00 p.m. may not be returned until the following business day.  We are closed weekends and major holidays. You have access to a nurse at all times for urgent questions. Please call the main number to the clinic Dept: [phone] and follow the prompts.   For any non-urgent questions, you may also contact your provider using MyChart. We now offer e-Visits for anyone 18 and older to request care online for non-urgent symptoms. For details visit [email or web address].   Also download the MyChart app! Go to the app store, search "MyChart", open the app, select [facility], and log in with your MyChart username and password.  Due to Covid, a mask is required upon entering the hospital/clinic. If you do not have a mask, one will be given to you upon arrival. For doctor visits, patients may have 1 support person aged 18 or older with them. For treatment visits, patients cannot have anyone with them due to current Covid guidelines and our immunocompromised population.   

## 2021-03-11 ENCOUNTER — Encounter: Payer: Self-pay | Admitting: Oncology

## 2021-03-13 ENCOUNTER — Telehealth: Payer: Self-pay

## 2021-03-13 NOTE — Telephone Encounter (Signed)
Telephone call to check on pre-operative status.  Patient compliant with pre-operative instructions.  Reinforced NPO after midnight.  No questions or concerns voiced.  Notified patient that her case has been moved up and she will need to arrive at [facility] tomorrow. Patient verbalized understanding. Instructed to call for any needs.

## 2021-03-14 ENCOUNTER — Encounter (HOSPITAL_COMMUNITY)
Admission: RE | Disposition: A | Discharge status: Home / Self Care | Payer: Self-pay | Source: Home / Self Care | Attending: Gynecologic Oncology

## 2021-03-14 ENCOUNTER — Ambulatory Visit (HOSPITAL_COMMUNITY): Payer: Medicaid Other | Admitting: Emergency Medicine

## 2021-03-14 ENCOUNTER — Encounter (HOSPITAL_COMMUNITY): Payer: Self-pay | Admitting: Gynecologic Oncology

## 2021-03-14 ENCOUNTER — Ambulatory Visit (HOSPITAL_COMMUNITY): Payer: Medicaid Other | Admitting: Anesthesiology

## 2021-03-14 ENCOUNTER — Ambulatory Visit (HOSPITAL_COMMUNITY)
Admission: RE | Admit: 2021-03-14 | Discharge: 2021-03-14 | Disposition: A | Discharge status: Home / Self Care | Payer: Medicaid Other | Attending: Gynecologic Oncology | Admitting: Gynecologic Oncology

## 2021-03-14 DIAGNOSIS — Z148 Genetic carrier of other disease: Secondary | ICD-10-CM | POA: Diagnosis not present

## 2021-03-14 DIAGNOSIS — K8020 Calculus of gallbladder without cholecystitis without obstruction: Secondary | ICD-10-CM | POA: Diagnosis present

## 2021-03-14 DIAGNOSIS — E66813 Obesity, class 3: Secondary | ICD-10-CM

## 2021-03-14 DIAGNOSIS — Z1509 Genetic susceptibility to other malignant neoplasm: Secondary | ICD-10-CM

## 2021-03-14 DIAGNOSIS — Z9013 Acquired absence of bilateral breasts and nipples: Secondary | ICD-10-CM | POA: Diagnosis not present

## 2021-03-14 DIAGNOSIS — Z1502 Genetic susceptibility to malignant neoplasm of ovary: Secondary | ICD-10-CM | POA: Insufficient documentation

## 2021-03-14 DIAGNOSIS — Z9221 Personal history of antineoplastic chemotherapy: Secondary | ICD-10-CM | POA: Diagnosis not present

## 2021-03-14 DIAGNOSIS — Z803 Family history of malignant neoplasm of breast: Secondary | ICD-10-CM | POA: Diagnosis not present

## 2021-03-14 DIAGNOSIS — N840 Polyp of corpus uteri: Secondary | ICD-10-CM | POA: Insufficient documentation

## 2021-03-14 DIAGNOSIS — N9489 Other specified conditions associated with female genital organs and menstrual cycle: Secondary | ICD-10-CM | POA: Diagnosis not present

## 2021-03-14 DIAGNOSIS — Z888 Allergy status to other drugs, medicaments and biological substances status: Secondary | ICD-10-CM | POA: Insufficient documentation

## 2021-03-14 DIAGNOSIS — Z853 Personal history of malignant neoplasm of breast: Secondary | ICD-10-CM | POA: Insufficient documentation

## 2021-03-14 DIAGNOSIS — Z87891 Personal history of nicotine dependence: Secondary | ICD-10-CM | POA: Diagnosis not present

## 2021-03-14 DIAGNOSIS — Z1501 Genetic susceptibility to malignant neoplasm of breast: Secondary | ICD-10-CM | POA: Diagnosis present

## 2021-03-14 DIAGNOSIS — Z6841 Body Mass Index (BMI) 40.0 and over, adult: Secondary | ICD-10-CM | POA: Diagnosis not present

## 2021-03-14 DIAGNOSIS — N888 Other specified noninflammatory disorders of cervix uteri: Secondary | ICD-10-CM | POA: Diagnosis not present

## 2021-03-14 DIAGNOSIS — N800 Endometriosis of uterus: Secondary | ICD-10-CM | POA: Insufficient documentation

## 2021-03-14 HISTORY — PX: ROBOTIC ASSISTED TOTAL HYSTERECTOMY WITH BILATERAL SALPINGO OOPHERECTOMY: SHX6086

## 2021-03-14 LAB — PREGNANCY, URINE: Preg Test, Ur: NEGATIVE

## 2021-03-14 SURGERY — HYSTERECTOMY, TOTAL, ROBOT-ASSISTED, LAPAROSCOPIC, WITH BILATERAL SALPINGO-OOPHORECTOMY
Anesthesia: General

## 2021-03-14 MED ORDER — FENTANYL CITRATE PF 50 MCG/ML IJ SOSY
25.0000 ug | PREFILLED_SYRINGE | INTRAMUSCULAR | Status: DC | PRN
  Administered 2021-03-14 (×2): 50 ug via INTRAVENOUS

## 2021-03-14 MED ORDER — BUPIVACAINE HCL 0.25 % IJ SOLN
INTRAMUSCULAR | Status: AC
  Filled 2021-03-14: qty 1

## 2021-03-14 MED ORDER — CHLORHEXIDINE GLUCONATE CLOTH 2 % EX PADS: 6.0000 | MEDICATED_PAD | Freq: Once | CUTANEOUS | Status: DC

## 2021-03-14 MED ORDER — SUCCINYLCHOLINE CHLORIDE 200 MG/10ML IV SOSY
PREFILLED_SYRINGE | INTRAVENOUS | Status: AC
  Filled 2021-03-14: qty 10

## 2021-03-14 MED ORDER — FENTANYL CITRATE PF 50 MCG/ML IJ SOSY
PREFILLED_SYRINGE | INTRAMUSCULAR | Status: AC
  Filled 2021-03-14: qty 2

## 2021-03-14 MED ORDER — CEFAZOLIN IN SODIUM CHLORIDE 3-0.9 GM/100ML-% IV SOLN
3.0000 g | INTRAVENOUS | Status: AC
  Administered 2021-03-14: 3 g via INTRAVENOUS
  Filled 2021-03-14: qty 100

## 2021-03-14 MED ORDER — OXYCODONE HCL 5 MG/5ML PO SOLN
5.0000 mg | Freq: Once | ORAL | Status: DC | PRN
Start: 2021-03-14 — End: 2021-03-14

## 2021-03-14 MED ORDER — ORAL CARE MOUTH RINSE: 15.0000 mL | Freq: Once | OROMUCOSAL | Status: AC

## 2021-03-14 MED ORDER — CHLORHEXIDINE GLUCONATE 0.12 % MT SOLN
15.0000 mL | Freq: Once | OROMUCOSAL | Status: AC
  Administered 2021-03-14: 15 mL via OROMUCOSAL

## 2021-03-14 MED ORDER — BUPIVACAINE HCL 0.25 % IJ SOLN
INTRAMUSCULAR | Status: DC | PRN
  Administered 2021-03-14: 18 mL

## 2021-03-14 MED ORDER — ROCURONIUM BROMIDE 10 MG/ML (PF) SYRINGE
PREFILLED_SYRINGE | INTRAVENOUS | Status: AC
  Filled 2021-03-14: qty 10

## 2021-03-14 MED ORDER — SUGAMMADEX SODIUM 500 MG/5ML IV SOLN
INTRAVENOUS | Status: DC | PRN
  Administered 2021-03-14: 300 mg via INTRAVENOUS

## 2021-03-14 MED ORDER — SODIUM CHLORIDE 0.9% FLUSH: 3.0000 mL | Freq: Two times a day (BID) | INTRAVENOUS | Status: DC

## 2021-03-14 MED ORDER — STERILE WATER FOR IRRIGATION IR SOLN
Status: DC | PRN
  Administered 2021-03-14: 1000 mL

## 2021-03-14 MED ORDER — LABETALOL HCL 5 MG/ML IV SOLN
5.0000 mg | Freq: Once | INTRAVENOUS | Status: AC
  Administered 2021-03-14: 5 mg via INTRAVENOUS

## 2021-03-14 MED ORDER — DEXMEDETOMIDINE (PRECEDEX) IN NS 20 MCG/5ML (4 MCG/ML) IV SYRINGE
PREFILLED_SYRINGE | INTRAVENOUS | Status: DC | PRN
  Administered 2021-03-14: 12 ug via INTRAVENOUS
  Administered 2021-03-14: 8 ug via INTRAVENOUS

## 2021-03-14 MED ORDER — DEXAMETHASONE SODIUM PHOSPHATE 4 MG/ML IJ SOLN: 4.0000 mg | INTRAMUSCULAR | Status: DC

## 2021-03-14 MED ORDER — LIDOCAINE HCL (PF) 2 % IJ SOLN
INTRAMUSCULAR | Status: AC
  Filled 2021-03-14: qty 5

## 2021-03-14 MED ORDER — AMISULPRIDE (ANTIEMETIC) 5 MG/2ML IV SOLN: 10.0000 mg | Freq: Once | INTRAVENOUS | Status: AC

## 2021-03-14 MED ORDER — FENTANYL CITRATE (PF) 250 MCG/5ML IJ SOLN
INTRAMUSCULAR | Status: AC
  Filled 2021-03-14: qty 5

## 2021-03-14 MED ORDER — LABETALOL HCL 5 MG/ML IV SOLN
INTRAVENOUS | Status: AC
  Filled 2021-03-14: qty 4

## 2021-03-14 MED ORDER — ONDANSETRON HCL 4 MG/2ML IJ SOLN
INTRAMUSCULAR | Status: AC
  Filled 2021-03-14: qty 2

## 2021-03-14 MED ORDER — FENTANYL CITRATE (PF) 100 MCG/2ML IJ SOLN
INTRAMUSCULAR | Status: AC
  Filled 2021-03-14: qty 2

## 2021-03-14 MED ORDER — ACETAMINOPHEN 500 MG PO TABS
1000.0000 mg | ORAL_TABLET | ORAL | Status: AC
  Administered 2021-03-14: 1000 mg via ORAL
  Filled 2021-03-14: qty 2

## 2021-03-14 MED ORDER — INDOCYANINE GREEN 25 MG IV SOLR
7.5000 mg | Freq: Once | INTRAVENOUS | Status: AC
  Administered 2021-03-14: 7.5 mg via INTRAVENOUS
  Filled 2021-03-14: qty 3

## 2021-03-14 MED ORDER — LACTATED RINGERS IV SOLN: INTRAVENOUS | Status: DC

## 2021-03-14 MED ORDER — ROCURONIUM BROMIDE 100 MG/10ML IV SOLN
INTRAVENOUS | Status: DC | PRN
  Administered 2021-03-14 (×2): 40 mg via INTRAVENOUS
  Administered 2021-03-14: 60 mg via INTRAVENOUS

## 2021-03-14 MED ORDER — PHENYLEPHRINE 40 MCG/ML (10ML) SYRINGE FOR IV PUSH (FOR BLOOD PRESSURE SUPPORT)
PREFILLED_SYRINGE | INTRAVENOUS | Status: AC
  Filled 2021-03-14: qty 10

## 2021-03-14 MED ORDER — PROPOFOL 10 MG/ML IV BOLUS
INTRAVENOUS | Status: DC | PRN
  Administered 2021-03-14: 200 mg via INTRAVENOUS

## 2021-03-14 MED ORDER — SUGAMMADEX SODIUM 500 MG/5ML IV SOLN
INTRAVENOUS | Status: AC
  Filled 2021-03-14: qty 5

## 2021-03-14 MED ORDER — SCOPOLAMINE 1 MG/3DAYS TD PT72
1.0000 | MEDICATED_PATCH | TRANSDERMAL | Status: DC
  Administered 2021-03-14: 1.5 mg via TRANSDERMAL
  Filled 2021-03-14: qty 1

## 2021-03-14 MED ORDER — ONDANSETRON HCL 4 MG/2ML IJ SOLN
INTRAMUSCULAR | Status: DC | PRN
  Administered 2021-03-14: 4 mg via INTRAVENOUS

## 2021-03-14 MED ORDER — DEXAMETHASONE SODIUM PHOSPHATE 4 MG/ML IJ SOLN
INTRAMUSCULAR | Status: DC | PRN
  Administered 2021-03-14: 4 mg via INTRAVENOUS

## 2021-03-14 MED ORDER — MIDAZOLAM HCL 5 MG/5ML IJ SOLN
INTRAMUSCULAR | Status: DC | PRN
  Administered 2021-03-14: 2 mg via INTRAVENOUS

## 2021-03-14 MED ORDER — ENOXAPARIN SODIUM 40 MG/0.4ML IJ SOSY
40.0000 mg | PREFILLED_SYRINGE | INTRAMUSCULAR | Status: AC
  Administered 2021-03-14: 40 mg via SUBCUTANEOUS
  Filled 2021-03-14: qty 0.4

## 2021-03-14 MED ORDER — SUCCINYLCHOLINE CHLORIDE 200 MG/10ML IV SOSY
PREFILLED_SYRINGE | INTRAVENOUS | Status: DC | PRN
  Administered 2021-03-14: 140 mg via INTRAVENOUS

## 2021-03-14 MED ORDER — OXYCODONE HCL 5 MG PO TABS: 5.0000 mg | ORAL_TABLET | Freq: Once | ORAL | Status: DC | PRN

## 2021-03-14 MED ORDER — BUPIVACAINE LIPOSOME 1.3 % IJ SUSP: 20.0000 mL | Freq: Once | INTRAMUSCULAR | Status: DC

## 2021-03-14 MED ORDER — FENTANYL CITRATE (PF) 100 MCG/2ML IJ SOLN
INTRAMUSCULAR | Status: DC | PRN
  Administered 2021-03-14 (×4): 50 ug via INTRAVENOUS
  Administered 2021-03-14: 100 ug via INTRAVENOUS
  Administered 2021-03-14: 25 ug via INTRAVENOUS
  Administered 2021-03-14: 50 ug via INTRAVENOUS
  Administered 2021-03-14: 25 ug via INTRAVENOUS

## 2021-03-14 MED ORDER — MIDAZOLAM HCL 2 MG/2ML IJ SOLN
INTRAMUSCULAR | Status: AC
  Filled 2021-03-14: qty 2

## 2021-03-14 MED ORDER — DEXAMETHASONE SODIUM PHOSPHATE 10 MG/ML IJ SOLN
INTRAMUSCULAR | Status: AC
  Filled 2021-03-14: qty 1

## 2021-03-14 MED ORDER — LACTATED RINGERS IR SOLN
Status: DC | PRN
  Administered 2021-03-14: 1000 mL

## 2021-03-14 MED ORDER — PHENYLEPHRINE HCL (PRESSORS) 10 MG/ML IV SOLN
INTRAVENOUS | Status: DC | PRN
  Administered 2021-03-14: 80 ug via INTRAVENOUS
  Administered 2021-03-14: 120 ug via INTRAVENOUS

## 2021-03-14 MED ORDER — GABAPENTIN 300 MG PO CAPS
300.0000 mg | ORAL_CAPSULE | ORAL | Status: AC
  Administered 2021-03-14: 300 mg via ORAL
  Filled 2021-03-14: qty 1

## 2021-03-14 MED ORDER — AMISULPRIDE (ANTIEMETIC) 5 MG/2ML IV SOLN
INTRAVENOUS | Status: AC
  Administered 2021-03-14: 10 mg via INTRAVENOUS
  Filled 2021-03-14: qty 4

## 2021-03-14 MED ORDER — LIDOCAINE 2% (20 MG/ML) 5 ML SYRINGE
INTRAMUSCULAR | Status: DC | PRN
  Administered 2021-03-14: 80 mg via INTRAVENOUS

## 2021-03-14 SURGICAL SUPPLY — 100 items
APPLICATOR SURGIFLO ENDO (HEMOSTASIS) IMPLANT
APPLIER CLIP 5 13 M/L LIGAMAX5 (MISCELLANEOUS)
BAG COUNTER SPONGE SURGICOUNT (BAG) IMPLANT
BAG LAPAROSCOPIC 12 15 PORT 16 (BASKET) IMPLANT
BAG RETRIEVAL 12/15 (BASKET)
BLADE SURG 15 STRL LF DISP TIS (BLADE) ×2 IMPLANT
BLADE SURG 15 STRL SS (BLADE) ×3
BLADE SURG SZ10 CARB STEEL (BLADE) IMPLANT
CELLS DAT CNTRL 66122 CELL SVR (MISCELLANEOUS) IMPLANT
CLIP APPLIE 5 13 M/L LIGAMAX5 (MISCELLANEOUS) IMPLANT
CLIP LIGATING HEM O LOK PURPLE (MISCELLANEOUS) IMPLANT
CLIP LIGATING HEMO O LOK GREEN (MISCELLANEOUS) ×3 IMPLANT
COVER BACK TABLE 60X90IN (DRAPES) ×3 IMPLANT
COVER SURGICAL LIGHT HANDLE (MISCELLANEOUS) ×3 IMPLANT
COVER TIP SHEARS 8 DVNC (MISCELLANEOUS) ×4 IMPLANT
COVER TIP SHEARS 8MM DA VINCI (MISCELLANEOUS) ×6
DECANTER SPIKE VIAL GLASS SM (MISCELLANEOUS) ×3 IMPLANT
DERMABOND ADVANCED (GAUZE/BANDAGES/DRESSINGS) ×2
DERMABOND ADVANCED .7 DNX12 (GAUZE/BANDAGES/DRESSINGS) ×4 IMPLANT
DRAPE ARM DVNC X/XI (DISPOSABLE) ×16 IMPLANT
DRAPE COLUMN DVNC XI (DISPOSABLE) ×4 IMPLANT
DRAPE DA VINCI XI ARM (DISPOSABLE) ×24
DRAPE DA VINCI XI COLUMN (DISPOSABLE) ×6
DRAPE SHEET LG 3/4 BI-LAMINATE (DRAPES) ×3 IMPLANT
DRAPE SURG IRRIG POUCH 19X23 (DRAPES) ×3 IMPLANT
DRSG OPSITE POSTOP 4X6 (GAUZE/BANDAGES/DRESSINGS) IMPLANT
DRSG OPSITE POSTOP 4X8 (GAUZE/BANDAGES/DRESSINGS) IMPLANT
ELECT PENCIL ROCKER SW 15FT (MISCELLANEOUS) IMPLANT
ELECT REM PT RETURN 15FT ADLT (MISCELLANEOUS) ×6 IMPLANT
GAUZE 4X4 16PLY ~~LOC~~+RFID DBL (SPONGE) ×3 IMPLANT
GAUZE SPONGE 2X2 8PLY STRL LF (GAUZE/BANDAGES/DRESSINGS) ×2 IMPLANT
GLOVE SURG ENC MOIS LTX SZ6 (GLOVE) ×12 IMPLANT
GLOVE SURG ENC MOIS LTX SZ6.5 (GLOVE) ×6 IMPLANT
GLOVE SURG ENC TEXT LTX SZ8 (GLOVE) ×6 IMPLANT
GOWN STRL REUS W/ TWL LRG LVL3 (GOWN DISPOSABLE) ×8 IMPLANT
GOWN STRL REUS W/TWL LRG LVL3 (GOWN DISPOSABLE) ×12
GOWN STRL REUS W/TWL XL LVL3 (GOWN DISPOSABLE) ×12 IMPLANT
GRASPER SUT TROCAR 14GX15 (MISCELLANEOUS) IMPLANT
HOLDER FOLEY CATH W/STRAP (MISCELLANEOUS) IMPLANT
IRRIG SUCT STRYKERFLOW 2 WTIP (MISCELLANEOUS) ×3
IRRIGATION SUCT STRKRFLW 2 WTP (MISCELLANEOUS) ×2 IMPLANT
KIT BASIN OR (CUSTOM PROCEDURE TRAY) ×3 IMPLANT
KIT PROCEDURE DA VINCI SI (MISCELLANEOUS)
KIT PROCEDURE DVNC SI (MISCELLANEOUS) IMPLANT
KIT TURNOVER KIT A (KITS) ×6 IMPLANT
MANIFOLD NEPTUNE II (INSTRUMENTS) ×3 IMPLANT
MANIPULATOR UTERINE 4.5 ZUMI (MISCELLANEOUS) IMPLANT
MARKER SKIN DUAL TIP RULER LAB (MISCELLANEOUS) ×3 IMPLANT
NEEDLE HYPO 22GX1.5 SAFETY (NEEDLE) ×6 IMPLANT
NEEDLE SPNL 18GX3.5 QUINCKE PK (NEEDLE) IMPLANT
OBTURATOR OPTICAL STANDARD 8MM (TROCAR) ×6
OBTURATOR OPTICAL STND 8 DVNC (TROCAR) ×4
OBTURATOR OPTICALSTD 8 DVNC (TROCAR) ×4 IMPLANT
PACK CARDIOVASCULAR III (CUSTOM PROCEDURE TRAY) ×3 IMPLANT
PACK ROBOT GYN CUSTOM WL (TRAY / TRAY PROCEDURE) ×3 IMPLANT
PAD POSITIONING PINK XL (MISCELLANEOUS) ×6 IMPLANT
PORT ACCESS TROCAR AIRSEAL 12 (TROCAR) ×2 IMPLANT
PORT ACCESS TROCAR AIRSEAL 5M (TROCAR) ×1
POUCH SPECIMEN RETRIEVAL 10MM (ENDOMECHANICALS) ×3 IMPLANT
RETRACTOR WND ALEXIS 25 LRG (MISCELLANEOUS) IMPLANT
RTRCTR WOUND ALEXIS 18CM MED (MISCELLANEOUS)
RTRCTR WOUND ALEXIS 25CM LRG (MISCELLANEOUS)
SCRUB EXIDINE 4% CHG 4OZ (MISCELLANEOUS) ×3 IMPLANT
SEAL CANN UNIV 5-8 DVNC XI (MISCELLANEOUS) ×14 IMPLANT
SEAL XI 5MM-8MM UNIVERSAL (MISCELLANEOUS) ×21
SEALER VESSEL DA VINCI XI (MISCELLANEOUS) ×3
SEALER VESSEL EXT DVNC XI (MISCELLANEOUS) ×2 IMPLANT
SET TRI-LUMEN FLTR TB AIRSEAL (TUBING) ×3 IMPLANT
SOL ANTI FOG 6CC (MISCELLANEOUS) ×2 IMPLANT
SOLUTION ANTI FOG 6CC (MISCELLANEOUS) ×1
SOLUTION ELECTROLUBE (MISCELLANEOUS) ×3 IMPLANT
SPONGE GAUZE 2X2 STER 10/PKG (GAUZE/BANDAGES/DRESSINGS) ×1
SPONGE T-LAP 18X18 ~~LOC~~+RFID (SPONGE) ×3 IMPLANT
STRIP CLOSURE SKIN 1/2X4 (GAUZE/BANDAGES/DRESSINGS) ×3 IMPLANT
SURGIFLO W/THROMBIN 8M KIT (HEMOSTASIS) IMPLANT
SUT MNCRL AB 4-0 PS2 18 (SUTURE) ×9 IMPLANT
SUT PDS AB 1 TP1 96 (SUTURE) IMPLANT
SUT VIC AB 0 CT1 27 (SUTURE) ×3
SUT VIC AB 0 CT1 27XBRD ANTBC (SUTURE) ×2 IMPLANT
SUT VIC AB 2-0 CT1 27 (SUTURE)
SUT VIC AB 2-0 CT1 TAPERPNT 27 (SUTURE) IMPLANT
SUT VIC AB 4-0 PS2 18 (SUTURE) ×6 IMPLANT
SUT VICRYL 0 TIES 12 18 (SUTURE) IMPLANT
SUT VLOC 180 0 9IN  GS21 (SUTURE) ×3
SUT VLOC 180 0 9IN GS21 (SUTURE) ×2 IMPLANT
SYR 10ML LL (SYRINGE) IMPLANT
SYR 20ML LL LF (SYRINGE) ×3 IMPLANT
SYR 50ML LL SCALE MARK (SYRINGE) IMPLANT
SYS RETRIEVAL 5MM INZII UNIV (BASKET)
SYSTEM RETRIEVL 5MM INZII UNIV (BASKET) IMPLANT
TOWEL OR 17X26 10 PK STRL BLUE (TOWEL DISPOSABLE) ×3 IMPLANT
TOWEL OR NON WOVEN STRL DISP B (DISPOSABLE) ×3 IMPLANT
TRAP SPECIMEN MUCUS 40CC (MISCELLANEOUS) IMPLANT
TRAY FOLEY MTR SLVR 16FR STAT (SET/KITS/TRAYS/PACK) ×3 IMPLANT
TROCAR BLADELESS OPT 5 100 (ENDOMECHANICALS) ×3 IMPLANT
TROCAR XCEL NON-BLD 5MMX100MML (ENDOMECHANICALS) IMPLANT
TUBING INSUFFLATION 10FT LAP (TUBING) ×3 IMPLANT
UNDERPAD 30X36 HEAVY ABSORB (UNDERPADS AND DIAPERS) ×3 IMPLANT
WATER STERILE IRR 1000ML POUR (IV SOLUTION) ×3 IMPLANT
YANKAUER SUCT BULB TIP 10FT TU (MISCELLANEOUS) IMPLANT

## 2021-03-14 NOTE — Anesthesia Procedure Notes (Signed)
Procedure Name: Intubation Date/Time: 03/14/2021 10:23 AM Performed by: Lieutenant Diego, CRNA Pre-anesthesia Checklist: Patient identified, Emergency Drugs available, Suction available and Patient being monitored Patient Re-evaluated:Patient Re-evaluated prior to induction Oxygen Delivery Method: Circle system utilized Preoxygenation: Pre-oxygenation with 100% oxygen Induction Type: IV induction Ventilation: Mask ventilation without difficulty Laryngoscope Size: Miller and 2 Grade View: Grade I Tube type: Oral Tube size: 7.0 mm Number of attempts: 1 Airway Equipment and Method: Stylet Placement Confirmation: ETT inserted through vocal cords under direct vision, positive ETCO2 and breath sounds checked- equal and bilateral Secured at: 23 cm Tube secured with: Tape Dental Injury: Teeth and Oropharynx as per pre-operative assessment

## 2021-03-14 NOTE — Transfer of Care (Signed)
Immediate Anesthesia Transfer of Care Note  Patient: Sheryl Mcmillan  Procedure(s) Performed: XI ROBOTIC ASSISTED TOTAL HYSTERECTOMY WITH BILATERAL SALPINGO OOPHORECTOMY (Bilateral) XI ROBOTIC ASSISTED LAPAROSCOPIC CHOLECYSTECTOMY  Patient Location: PACU  Anesthesia Type:General  Level of Consciousness: awake  Airway & Oxygen Therapy: Patient Spontanous Breathing and Patient connected to face mask oxygen  Post-op Assessment: Report given to RN and Post -op Vital signs reviewed and stable  Post vital signs: Reviewed and stable  Last Vitals:  Vitals Value Taken Time  BP 135/100 03/14/21 1307  Temp    Pulse 86 03/14/21 1309  Resp 14 03/14/21 1309  SpO2 100 % 03/14/21 1309  Vitals shown include unvalidated device data.  Last Pain:  Vitals:   03/14/21 0807  TempSrc: Oral         Complications: No notable events documented.

## 2021-03-14 NOTE — H&P (Signed)
H&P Note: Gyn-Onc  Consult was requested by Wilber Bihari NP for the evaluation of Sheryl Mcmillan 45 y.o. female  CC:  BRCA1 deleterious mutation, cholelithiasis. Morbid obesity. Hx of breast cancer.    Assessment/Plan:  Sheryl. [name]  is a 45 y.o.  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   The patient is s/p chemotherapy, mastectomy and tissue expanders. She has gall bladder disease and desires cholecystectomy at the same time as her risk reducing surgery. Risk reducing surgery for Sheryl Mcmillan would involve a robotic assisted total hysterectomy with BSO.   I explained that this will do surgical menopause (though menopause may already be in place following chemotherapy administration.  I explained that unfortunately she will not be able to receive hormone replacement therapy to ameliorate her symptoms of menopause due to her HR positive breast cancer.  We will have her seen by the surgeons to evaluate her for the need for a cholecystectomy and the possibility of a joint case.   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  The patient was diagnosed with a left-sided breast cancer (stage II, ER positive, HER-2 positive) on May 22, 2020.  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  Treatment for her breast cancer was neoadjuvant chemotherapy followed by surgery and hormonal modulation with antiestrogen and possibly anti-HER-2 treatment. She completed chemotherapy on 09/29/20 and received a mastectomy and tissue expanders.  Her medical history is most significant for morbid obesity with a BMI of 45kg/m2.  TVUS (screening) on 08/12/20 revealed a uterus  measuring 7.8x3.3x4.3cm. The endometrium was 70m. The right and left ovaries were normal bilaterally. CA 125 was normal at 10.2 on 07/28/20.  Interval Hx: She has breast tissue expanders in and is awaiting replacement with implants. She has been amenorrheic since completing chemotherapy. She has known gall bladder stones and has had 2 attacks in recent years. She was told that she needed to have her gall bladder removed, but could not do this earlier due to lack of insurance. She has a GB ultrasound scheduled for early July. She desires robotic assisted risk reducing hysterectomy with BSO and desires concurrent cholecystectomy.  Current Meds:  Outpatient Encounter Medications as of 12/09/2020  Medication Sig   Acetaminophen (TYLENOL ARTHRITIS PAIN PO) Take by mouth. As needed   Multiple Vitamins-Minerals (ONE-A-DAY WOMENS PO) Take 1 tablet by mouth daily.   [DISCONTINUED] aspirin-acetaminophen-caffeine (EXCEDRIN MIGRAINE) 250-250-65 MG tablet Take 1-2 tablets by mouth every 6 (six) hours as needed for headache.   [DISCONTINUED] lidocaine-prilocaine (EMLA) cream APPLY TO AFFECTED AREA ONCE   Facility-Administered Encounter Medications as of 12/09/2020  Medication   sodium chloride flush (NS) 0.9 % injection 10 mL    Allergy:  Allergies  Allergen Reactions   Carboplatin Hives and Itching    Carboplatin reaction after dose #5 requiring Benadryl, Solumedrol, epinephrine, Claritin.   Other Hives    Baking Soda   Wound Dressing Adhesive Hives    Rips skin, prefers paper tape    Social Hx:   Social History   Socioeconomic History   Marital status: Single    Spouse name: Not on file  Number of children: 1   Years of education: Not on file   Highest education level: Some college, no degree  Occupational History   Not on file  Tobacco Use   Smoking status: Former    Packs/day: 0.50    Years: 10.00    Pack years: 5.00    Types: Cigarettes    Quit date: 08/23/2020    Years since  quitting: 0.5   Smokeless tobacco: Never  Vaping Use   Vaping Use: Never used  Substance and Sexual Activity   Alcohol use: Yes    Comment: rare   Drug use: Yes    Types: Marijuana    Comment: 5-7 days/week   Sexual activity: Yes    Birth control/protection: None  Other Topics Concern   Not on file  Social History Narrative   Not on file   Social Determinants of Health   Financial Resource Strain: Not on file  Food Insecurity: Not on file  Transportation Needs: No Transportation Needs   Lack of Transportation (Medical): No   Lack of Transportation (Non-Medical): No  Physical Activity: Not on file  Stress: Not on file  Social Connections: Not on file  Intimate Partner Violence: Not on file    Past Surgical Hx:  Past Surgical History:  Procedure Laterality Date   AXILLARY SENTINEL NODE BIOPSY Left 11/07/2020   Procedure: LEFT AXILLARY SENTINEL NODE BIOPSY;  Surgeon: [name], MD;  Location: [facility];  Service: General;  Laterality: Left;   BREAST RECONSTRUCTION WITH PLACEMENT OF TISSUE EXPANDER AND FLEX HD (ACELLULAR HYDRATED DERMIS) Bilateral 11/07/2020   Procedure: BILATERAL BREAST RECONSTRUCTION WITH PLACEMENT OF TISSUE EXPANDER AND FLEX HD (ACELLULAR HYDRATED DERMIS);  Surgeon: [name], MD;  Location: [facility];  Service: Plastics;  Laterality: Bilateral;   COSMETIC SURGERY     DILATION AND CURETTAGE OF UTERUS     HEMORRHOID SURGERY     IR IMAGING GUIDED PORT INSERTION  06/15/2020   PORT-A-CATH REMOVAL Right 11/07/2020   Procedure: REMOVAL PORT-A-CATH;  Surgeon: [name], MD;  Location: [facility];  Service: General;  Laterality: Right;   TOTAL MASTECTOMY Bilateral 11/07/2020   Procedure: BILATERAL MASTECTOMY;  Surgeon: [name], MD;  Location: [facility];  Service: General;  Laterality: Bilateral;    Past Medical Hx:  Past Medical History:  Diagnosis Date   Arthritis    Breast cancer ([place])    Family history of non-Hodgkin's lymphoma 06/01/2020    GERD (gastroesophageal reflux disease)    Hemorrhoids    Migraines    PCOS (polycystic ovarian syndrome)     Past Gynecological History:  See HPI No LMP recorded.  Family Hx:  Family History  Problem Relation Age of Onset   Hypertension Mother    Diabetes Mother    Cancer Mother 19       unknown primary   Other Mother        brain tumor; dx 4s   Non-Hodgkin's lymphoma Brother 3   Other Maternal Uncle 63       brain tumor   Breast cancer Other        MGM's niece; dx late 25s   Pancreatic cancer Neg Hx    Colon cancer Neg Hx    Endometrial cancer Neg Hx    Prostate cancer Neg Hx    Ovarian cancer Neg Hx     Review of Systems:  Constitutional  Feels well,   ENT Normal appearing ears and nares bilaterally Skin/Breast  No rash, sores,  jaundice, itching, dryness Cardiovascular  No chest pain, shortness of breath, or edema  Pulmonary  No cough or wheeze.  Gastro Intestinal  No nausea, vomitting, or diarrhoea. No bright red blood per rectum, no abdominal pain, change in bowel movement, or constipation.  Genito Urinary  No frequency, urgency, dysuria, irregular menses since starting chemotherapy Musculo Skeletal  No myalgia, arthralgia, joint swelling or pain  Neurologic  No weakness, numbness, change in gait,  Psychology  No depression, anxiety, insomnia.   Vitals:  Blood pressure (!) 151/99, pulse 82, temperature 98.4 F (36.9 C), temperature source Oral, resp. rate 18, SpO2 97 %.  Physical Exam: WD in NAD Neck  Supple NROM, without any enlargements.  Lymph Node Survey No cervical supraclavicular or inguinal adenopathy Cardiovascular  Pulse normal rate, regularity and rhythm. S1 and S2 normal.  Lungs  Clear to auscultation bilateraly, without wheezes/crackles/rhonchi. Good air movement.  Skin  No rash/lesions/breakdown  Psychiatry  Alert and oriented to person, place, and time  Abdomen  Normoactive bowel sounds, abdomen soft, non-tender and obese  without evidence of hernia. Pannus present. Back No CVA tenderness Genito Urinary  Vulva/vagina: Normal external female genitalia.  No lesions. No discharge or bleeding.  Bladder/urethra:  No lesions or masses, well supported bladder  Vagina: long, normal, no lesions.  Cervix: Normal appearing, no lesions. Pap taken.  Uterus:  Small, mobile, no parametrial involvement or nodularity.  Adnexa: no palpable masses. Rectal  deferred Extremities  No bilateral cyanosis, clubbing or edema.    Thereasa Solo, MD  03/14/2021, 9:44 AM

## 2021-03-14 NOTE — Op Note (Signed)
Sheryl Mcmillan  1975/11/01   03/14/2021    PCP:  Windell Hummingbird, PA-C   Surgeon: Kaylyn Lim, MD, FACS  Asst:  Everitt Amber, MD  Anes:  general  Preop Dx: Biliary dyskinesia Postop Dx: same  Procedure: Xi robotic cholecystectomy Location Surgery: [facility] Complications: None   EBL:   minimal cc  Drains: none  Description of Procedure:  The patient was taken to OR 3 and underwent a robotic TAH/BSO by Dr. Denman George.   [name]  When she was completed we redocked the robot to perform a cholecystectomy after a time out.   With a tip up in 1, a fenestrated bipolar in two, camera in three and hook in 4 I began the cholecystectomy.  With the gallbladder retracted, Calot's triangle was dissected.  The Firefly was used to assist in the delineation of the anatomy.  A critical view was obtained.  Two clips were left on the cystic duct and two clips were left on the cystic artery and the gallbladder was removed with one drainage opening made in the fundus.  Copious irrigation was used by Dr. Denman George to remove the bile.  No stones were released as she was felt to have biliary dyskinesia and no stones.  The removal was complete and hemostasis was present.  The gallbladder was placed in a bag and removed.  Dr. Denman George completed the trocar incision closure.    The patient tolerated the procedure well and was taken to the PACU in stable condition.     Matt B. Hassell Done, [place], [facility], [place]

## 2021-03-14 NOTE — Op Note (Signed)
OPERATIVE NOTE 03/14/21  Surgeon: Rossi, Emma Caroline   Assistants: Dr Lisa Jackson-Moore (an MD assistant was necessary for tissue manipulation, management of robotic instrumentation, retraction and positioning due to the complexity of the case and hospital policies).   Anesthesia: General endotracheal anesthesia  ASA Class: 3   Pre-operative Diagnosis:  deleterious mutation in BRCA1  Post-operative Diagnosis:  same  Operation: Robotic-assisted laparoscopic total hysterectomy with bilateral salpingoophorectomy   Surgeon: Rossi, Emma Caroline  Assistant Surgeon: Lisa Jackson Moore MD  Anesthesia: GET  Urine Output: 100cc  Operative Findings:  : normal appearing uterus, tubes and ovaries. Normal upper abdomen and omentum.  Estimated Blood Loss:   20cc       Total IV Fluids: 800 ml         Specimens:  washings, uterus with cervix and bilateral tubes and ovaries         Complications:  None; patient tolerated the procedure well.         Disposition: PACU - hemodynamically stable.  Procedure Details  The patient was seen in the Holding Room. The risks, benefits, complications, treatment options, and expected outcomes were discussed with the patient.  The patient concurred with the proposed plan, giving informed consent.  The site of surgery properly noted/marked. The patient was identified as Sheryl Mcmillan and the procedure verified as a Robotic-assisted hysterectomy with bilateral salpingo oophorectomy. A Time Out was held and the above information confirmed.  After induction of anesthesia, the patient was draped and prepped in the usual sterile manner. Pt was placed in supine position after anesthesia and draped and prepped in the usual sterile manner. The abdominal drape was placed after the CholoraPrep had been allowed to dry for 3 minutes.  Her arms were tucked to her side with all appropriate precautions.  The shoulders were stabilized with padded shoulder blocks applied to  the acromium processes.  The patient was placed in the semi-lithotomy position in Allen stirrups.  The perineum was prepped with Betadine. The patient was then prepped. Foley catheter was placed.  A sterile speculum was placed in the vagina.  The cervix was grasped with a single-tooth tenaculum and dilated with Pratt dilators.  The ZUMI uterine manipulator with a medium colpotomizer ring was placed without difficulty.  A pneum occluder balloon was placed over the manipulator.  OG tube placement was confirmed and to suction.   Next, a 5 mm skin incision was made 1 cm below the subcostal margin in the midclavicular line.  The 5 mm Optiview port and scope was used for direct entry.  Opening pressure was under 10 mm CO2.  The abdomen was insufflated and the findings were noted as above.   At this point and all points during the procedure, the patient's intra-abdominal pressure did not exceed 15 mmHg. Next, a 10 mm skin incision was made in the umbilicus and a right and left port was placed about 10 cm lateral to the robot port on the right and left side.  A fourth arm was placed in the right mid abdomen level with the other ports.  All ports were placed under direct visualization.  The patient was placed in steep Trendelenburg.  Bowel was folded away into the upper abdomen.  The robot was docked in the normal manner.  The hysterectomy was started after the round ligament on the right side was incised and the retroperitoneum was entered and the pararectal space was developed.  The ureter was noted to be on the medial leaf   of the broad ligament.  The peritoneum above the ureter was incised and stretched and the infundibulopelvic ligament was skeletonized, cauterized and cut.  The posterior peritoneum was taken down to the level of the KOH ring.  The anterior peritoneum was also taken down.  The bladder flap was created to the level of the KOH ring.  The uterine artery on the right side was skeletonized, cauterized and  cut in the normal manner.  A similar procedure was performed on the left.  The colpotomy was made and the uterus, cervix, bilateral ovaries and tubes were amputated and delivered through the vagina.  Pedicles were inspected and excellent hemostasis was achieved.    The colpotomy at the vaginal cuff was closed with Vicryl on a CT1 at the corners and v-loc running suture in a 2 layer closure.  Irrigation was used and excellent hemostasis was achieved.   Dr Martin proceded with the robotic cholecystectomy (please refer to separate operative note).   At this point in the procedure was completed.  Robotic instruments were removed under direct visulaization.  The robot was undocked. The 10 mm ports were closed with Vicryl on a UR-5 needle and the fascia was closed with 0 Vicryl on a UR-5 needle.  The skin was closed with 4-0 Vicryl in a subcuticular manner.  Dermabond was applied.  Sponge, lap and needle counts correct x 2.  The patient was taken to the recovery room in stable condition.  The vagina was swabbed with  minimal bleeding noted.   All instrument and needle counts were correct x  3.   The patient was transferred to the recovery room in a stable condition.  Rossi, Emma Caroline, MD     

## 2021-03-14 NOTE — Anesthesia Preprocedure Evaluation (Addendum)
Anesthesia Evaluation  Patient identified by MRN, date of birth, ID band Patient awake    Reviewed: Allergy & Precautions, NPO status , Patient's Chart, lab work & pertinent test results  Airway Mallampati: III  TM Distance: >3 FB Neck ROM: Full    Dental  (+) Edentulous Upper, Edentulous Lower   Pulmonary neg pulmonary ROS, former smoker,    Pulmonary exam normal        Cardiovascular negative cardio ROS   Rhythm:Regular Rate:Normal     Neuro/Psych  Headaches, negative psych ROS   GI/Hepatic Neg liver ROS, GERD  Medicated,  Endo/Other  Morbid obesity  Renal/GU negative Renal ROS  negative genitourinary   Musculoskeletal  (+) Arthritis , Osteoarthritis,  BRCA 1   Abdominal (+)  Abdomen: soft. Bowel sounds: normal.  Peds  Hematology negative hematology ROS (+)   Anesthesia Other Findings   Reproductive/Obstetrics                            Anesthesia Physical Anesthesia Plan  ASA: 3  Anesthesia Plan: General   Post-op Pain Management:    Induction: Intravenous  PONV Risk Score and Plan: 3 and Ondansetron, Dexamethasone, Midazolam, Treatment may vary due to age or medical condition and Scopolamine patch - Pre-op  Airway Management Planned: Mask and Oral ETT  Additional Equipment: None  Intra-op Plan:   Post-operative Plan: Extubation in OR  Informed Consent: I have reviewed the patients History and Physical, chart, labs and discussed the procedure including the risks, benefits and alternatives for the proposed anesthesia with the patient or authorized representative who has indicated his/her understanding and acceptance.     Dental advisory given  Plan Discussed with: CRNA  Anesthesia Plan Comments: (Lab Results      Component                Value               Date                      WBC                      9.0                 03/09/2021                HGB                       12.6                03/09/2021                HCT                      40.1                03/09/2021                MCV                      82.3                03/09/2021                PLT  312                 03/09/2021           Lab Results      Component                Value               Date                      NA                       140                 03/09/2021                K                        4.0                 03/09/2021                CO2                      24                  03/09/2021                GLUCOSE                  155 (H)             03/09/2021                BUN                      8                   03/09/2021                CREATININE               0.82                03/09/2021                CALCIUM                  9.1                 03/09/2021                GFRNONAA                 >60                 03/09/2021                GFRAA                    >60                 06/06/2019           ECHO 07/22: 1. Left ventricular ejection fraction, by estimation, is 60 to 65%. The  left ventricle has normal function. The left ventricle has no regional  wall motion abnormalities. There is mild concentric left ventricular  hypertrophy. Left ventricular diastolic  parameters were normal.  2. Right ventricular systolic function is normal. The right ventricular  size is normal.  3. The mitral valve is grossly normal. No evidence of mitral valve  regurgitation. No evidence of mitral stenosis.  4. The aortic valve was not well visualized. Aortic valve regurgitation  is not visualized. No aortic stenosis is present. )       Anesthesia Quick Evaluation

## 2021-03-14 NOTE — Anesthesia Postprocedure Evaluation (Signed)
Anesthesia Post Note  Patient: Sheryl Mcmillan  Procedure(s) Performed: XI ROBOTIC ASSISTED TOTAL HYSTERECTOMY WITH BILATERAL SALPINGO OOPHORECTOMY (Bilateral) XI ROBOTIC ASSISTED LAPAROSCOPIC CHOLECYSTECTOMY     Patient location during evaluation: PACU Anesthesia Type: General Level of consciousness: awake and alert Pain management: pain level controlled Vital Signs Assessment: post-procedure vital signs reviewed and stable Respiratory status: spontaneous breathing, nonlabored ventilation, respiratory function stable and patient connected to nasal cannula oxygen Cardiovascular status: blood pressure returned to baseline and stable Postop Assessment: no apparent nausea or vomiting Anesthetic complications: no   No notable events documented.  Last Vitals:  Vitals:   03/14/21 1445 03/14/21 1517  BP: (!) 160/76 (!) 171/87  Pulse: 67 66  Resp: 20 18  Temp: 36.6 C 36.6 C  SpO2: 94% 94%    Last Pain:  Vitals:   03/14/21 1517  TempSrc:   PainSc: 0-No pain                 Belenda Cruise P Arelly Whittenberg

## 2021-03-14 NOTE — Discharge Instructions (Addendum)
Return to work: 4 weeks (2 weeks with physical restrictions).  Activity: 1. Be up and out of the bed during the day.  Take a nap if needed.  You may walk up steps but be careful and use the hand rail.  Stair climbing will tire you more than you think, you may need to stop part way and rest.   2. No lifting or straining for 4 weeks.  3. No driving for 1 weeks.  Do Not drive if you are taking narcotic pain medicine.  4. Shower daily.  Use soap and water on your incision and pat dry; don't rub.   5. No sexual activity and nothing in the vagina for 8 weeks.  Medications:  - Take ibuprofen and tylenol first line for pain control. Take these regularly (every 6 hours) to decrease the build up of pain.  - If necessary, for severe pain not relieved by ibuprofen, contact Dr Serita Grit office and you will be prescribed percocet.  - While taking percocet you should take sennakot every night to reduce the likelihood of constipation. If this causes diarrhea, stop its use.  Diet: 1. Low sodium Heart Healthy Diet is recommended.  2. It is safe to use a laxative if you have difficulty moving your bowels.   Wound Care: 1. Keep clean and dry.  Shower daily.  Reasons to call the Doctor:  Fever - Oral temperature greater than 100.4 degrees Fahrenheit Foul-smelling vaginal discharge Difficulty urinating Nausea and vomiting Increased pain at the site of the incision that is unrelieved with pain medicine. Difficulty breathing with or without chest pain New calf pain especially if only on one side Sudden, continuing increased vaginal bleeding with or without clots.   Follow-up: 1. See Everitt Amber in 4 weeks.  Contacts: For questions or concerns you should contact:  Dr. Everitt Amber at [phone] After hours and on week-ends call [phone] and ask to speak to the physician on call for Gynecologic Oncology

## 2021-03-15 ENCOUNTER — Encounter (HOSPITAL_COMMUNITY): Payer: Self-pay | Admitting: Gynecologic Oncology

## 2021-03-15 ENCOUNTER — Telehealth: Payer: Self-pay

## 2021-03-15 NOTE — Telephone Encounter (Signed)
Spoke with Sheryl Mcmillan this morning. She states she is eating, drinking and urinating well. She has not had a BM yet but is passing gas. She has not taken senokot yet but is planning on taking it tonight. Encouraged her to drink plenty of water. She denies fever or chills. Incisions are dry and intact. Her pain is controlled with tylenol and advil.  Instructed to call office with any fever, chills, purulent drainage, uncontrolled pain or any other questions or concerns. Patient verbalizes understanding.   Pt aware of post op appointments as well as the office number [phone] and after hours number [phone] to call if she has any questions or concerns

## 2021-03-16 LAB — SURGICAL PATHOLOGY

## 2021-03-17 ENCOUNTER — Telehealth: Payer: Self-pay

## 2021-03-17 LAB — CYTOLOGY - NON PAP

## 2021-03-17 NOTE — Telephone Encounter (Signed)
Left message requesting return call. Regarding surgical pathology.

## 2021-03-17 NOTE — Telephone Encounter (Signed)
Returning call to Sheryl Mcmillan. Notified her of benign surgical pathology results.Patient verbalized understanding. Instructed to call for questions or concerns.

## 2021-03-20 ENCOUNTER — Telehealth: Payer: Self-pay | Admitting: Plastic Surgery

## 2021-03-20 NOTE — Telephone Encounter (Signed)
Called patient to inform her that [employer] denied the fat filling of breasts (CPT 15771, [id number]) but approved the surgery to exchange the expanders for implants and remove axillary tissue. Explained to her that I called [employer] twice to determine why they would not cover the other portion of surgery and was advised that it was not on the [facility] Medicaid Fee schedule so they would not approve it. Explained to her that Dr. [name] could do the approved portion of surgery and they could do the fat grating later or we could do it at the same time but it would be considered cosmetic and discussed potential fees. Patient didn't understand why they would only cover part of the surgery.  She stated she submitted an appeal on her end. Discussed a few potential dates with her but patient would like to think more about what to do before confirming a date. Patient will call back to schedule.

## 2021-03-23 ENCOUNTER — Other Ambulatory Visit: Payer: Medicaid Other

## 2021-03-23 ENCOUNTER — Other Ambulatory Visit: Payer: Self-pay

## 2021-03-23 ENCOUNTER — Ambulatory Visit (INDEPENDENT_AMBULATORY_CARE_PROVIDER_SITE_OTHER): Payer: Medicaid Other | Admitting: Plastic Surgery

## 2021-03-23 ENCOUNTER — Ambulatory Visit: Payer: Medicaid Other

## 2021-03-23 DIAGNOSIS — C50412 Malignant neoplasm of upper-outer quadrant of left female breast: Secondary | ICD-10-CM

## 2021-03-23 DIAGNOSIS — Z170 Estrogen receptor positive status [ER+]: Secondary | ICD-10-CM

## 2021-03-23 NOTE — Progress Notes (Signed)
Patient presents for further discussion regarding her upcoming breast surgery.  She did recently have her OB/GYN procedure which she is done well from.  This was about a week ago.  We had discussed doing expander exchange to implants along with removing excess lateral tissue.  I was also going to do fat grafting at the same time but there is some question as to whether or not that will be approved through her insurance.  I do think either way we should go ahead with the case plus or minus the fat grafting.  I am going to order a larger saline implant in the event that the largest gel implant does not seem to fill out the space and I want to add more volume.  We again reviewed the risks and benefits of the procedure.  She is comfortable with the plan moving forward.

## 2021-03-29 ENCOUNTER — Encounter: Payer: Self-pay | Admitting: *Deleted

## 2021-03-30 ENCOUNTER — Other Ambulatory Visit: Payer: Medicaid Other

## 2021-03-30 ENCOUNTER — Encounter: Payer: Self-pay | Admitting: *Deleted

## 2021-03-30 ENCOUNTER — Inpatient Hospital Stay: Payer: Medicaid Other

## 2021-03-30 ENCOUNTER — Inpatient Hospital Stay: Payer: Medicaid Other | Attending: Oncology

## 2021-03-30 ENCOUNTER — Ambulatory Visit: Payer: Medicaid Other

## 2021-03-30 ENCOUNTER — Other Ambulatory Visit: Payer: Self-pay

## 2021-03-30 VITALS — BP 133/74 | HR 88 | Temp 98.5°F | Resp 18 | Ht 71.0 in | Wt 322.4 lb

## 2021-03-30 DIAGNOSIS — Z1501 Genetic susceptibility to malignant neoplasm of breast: Secondary | ICD-10-CM

## 2021-03-30 DIAGNOSIS — Z1509 Genetic susceptibility to other malignant neoplasm: Secondary | ICD-10-CM

## 2021-03-30 DIAGNOSIS — Z5112 Encounter for antineoplastic immunotherapy: Secondary | ICD-10-CM | POA: Diagnosis not present

## 2021-03-30 DIAGNOSIS — Z170 Estrogen receptor positive status [ER+]: Secondary | ICD-10-CM

## 2021-03-30 DIAGNOSIS — C50412 Malignant neoplasm of upper-outer quadrant of left female breast: Secondary | ICD-10-CM

## 2021-03-30 DIAGNOSIS — Z79899 Other long term (current) drug therapy: Secondary | ICD-10-CM | POA: Diagnosis not present

## 2021-03-30 LAB — CMP (CANCER CENTER ONLY)
ALT: 25 U/L (ref 0–44)
AST: 21 U/L (ref 15–41)
Albumin: 3.5 g/dL (ref 3.5–5.0)
Alkaline Phosphatase: 97 U/L (ref 38–126)
Anion gap: 11 (ref 5–15)
BUN: 14 mg/dL (ref 6–20)
CO2: 22 mmol/L (ref 22–32)
Calcium: 8.8 mg/dL — ABNORMAL LOW (ref 8.9–10.3)
Chloride: 106 mmol/L (ref 98–111)
Creatinine: 0.85 mg/dL (ref 0.44–1.00)
GFR, Estimated: >60 mL/min (ref >60)
Glucose, Bld: 164 mg/dL — ABNORMAL HIGH (ref 70–99)
Potassium: 4.7 mmol/L (ref 3.5–5.1)
Sodium: 139 mmol/L (ref 135–145)
Total Bilirubin: <0.2 mg/dL — ABNORMAL LOW (ref 0.3–1.2)
Total Protein: 6.8 g/dL (ref 6.5–8.1)

## 2021-03-30 LAB — CBC WITH DIFFERENTIAL (CANCER CENTER ONLY)
Abs Immature Granulocytes: 0.05 10*3/uL (ref 0.00–0.07)
Basophils Absolute: 0 10*3/uL (ref 0.0–0.1)
Basophils Relative: 0 %
Eosinophils Absolute: 0.3 10*3/uL (ref 0.0–0.5)
Eosinophils Relative: 3 %
HCT: 39.6 % (ref 36.0–46.0)
Hemoglobin: 12.6 g/dL (ref 12.0–15.0)
Immature Granulocytes: 1 %
Lymphocytes Relative: 32 %
Lymphs Abs: 3 10*3/uL (ref 0.7–4.0)
MCH: 26.3 pg (ref 26.0–34.0)
MCHC: 31.8 g/dL (ref 30.0–36.0)
MCV: 82.5 fL (ref 80.0–100.0)
Monocytes Absolute: 0.4 10*3/uL (ref 0.1–1.0)
Monocytes Relative: 4 %
Neutro Abs: 5.6 10*3/uL (ref 1.7–7.7)
Neutrophils Relative %: 60 %
Platelet Count: 318 10*3/uL (ref 150–400)
RBC: 4.8 MIL/uL (ref 3.87–5.11)
RDW: 15.8 % — ABNORMAL HIGH (ref 11.5–15.5)
WBC Count: 9.3 10*3/uL (ref 4.0–10.5)
nRBC: 0 % (ref 0.0–0.2)

## 2021-03-30 MED ORDER — TRASTUZUMAB-HYALURONIDASE-OYSK 600-10000 MG-UNT/5ML ~~LOC~~ SOLN
600.0000 mg | Freq: Once | SUBCUTANEOUS | Status: AC
  Administered 2021-03-30: 600 mg via SUBCUTANEOUS
  Filled 2021-03-30: qty 5

## 2021-03-30 NOTE — Research (Signed)
[facility]-[place]-2102 - TREATMENT OF ESTABLISHED CHEMOTHERAPY-INDUCED NEUROPATHY WITH N-PALMITOYLETHANOLAMIDE, A CANNABIMIMETIC NUTRACEUTICAL: A RANDOMIZED DOUBLE-BLIND PHASE II PILOT TRIAL  Spoke with patient in infusion room today to follow up on her interest in the above mentioned trial. Patient reports her neuropathy symptoms have been improving and she rates her symptoms as a 3 on a 0-10 scale in the past week. Informed patient she is not currently eligible for the study since her neuropathy has improved. Encouraged her to call research nurse or notify her Oncologist if her symptoms  worsen again in the future. Thanked patient for taking the time to discuss this study with research nurse. She verbalized understanding. Dr. Jana Hakim notified.  Foye [facility], BSN, RN Clinical Research Nurse 03/30/2021

## 2021-03-30 NOTE — Patient Instructions (Signed)
[facility]  Discharge Instructions: Thank you for choosing [facility] to provide your oncology and hematology care.   If you have a lab appointment with the Cancer Center, please go directly to the Cancer Center and check in at the registration area.   Wear comfortable clothing and clothing appropriate for easy access to any Portacath or PICC line.   We strive to give you quality time with your provider. You may need to reschedule your appointment if you arrive late (15 or more minutes).  Arriving late affects you and other patients whose appointments are after yours.  Also, if you miss three or more appointments without notifying the office, you may be dismissed from the clinic at the provider's discretion.      For prescription refill requests, have your pharmacy contact our office and allow 72 hours for refills to be completed.    Today you received the following chemotherapy and/or immunotherapy agents: Herceptin Hylecta.     To help prevent nausea and vomiting after your treatment, we encourage you to take your nausea medication as directed.  BELOW ARE SYMPTOMS THAT SHOULD BE REPORTED IMMEDIATELY: . *FEVER GREATER THAN 100.4 F (38 C) OR HIGHER . *CHILLS OR SWEATING . *NAUSEA AND VOMITING THAT IS NOT CONTROLLED WITH YOUR NAUSEA MEDICATION . *UNUSUAL SHORTNESS OF BREATH . *UNUSUAL BRUISING OR BLEEDING . *URINARY PROBLEMS (pain or burning when urinating, or frequent urination) . *BOWEL PROBLEMS (unusual diarrhea, constipation, pain near the anus) . TENDERNESS IN MOUTH AND THROAT WITH OR WITHOUT PRESENCE OF ULCERS (sore throat, sores in mouth, or a toothache) . UNUSUAL RASH, SWELLING OR PAIN  . UNUSUAL VAGINAL DISCHARGE OR ITCHING   Items with * indicate a potential emergency and should be followed up as soon as possible or go to the Emergency Department if any problems should occur.  Please show the CHEMOTHERAPY ALERT CARD or  IMMUNOTHERAPY ALERT CARD at check-in to the Emergency Department and triage nurse.  Should you have questions after your visit or need to cancel or reschedule your appointment, please contact [facility]  Dept: [phone]  and follow the prompts.  Office hours are 8:00 a.m. to 4:30 p.m. Monday - Friday. Please note that voicemails left after 4:00 p.m. may not be returned until the following business day.  We are closed weekends and major holidays. You have access to a nurse at all times for urgent questions. Please call the main number to the clinic Dept: [phone] and follow the prompts.   For any non-urgent questions, you may also contact your provider using MyChart. We now offer e-Visits for anyone 18 and older to request care online for non-urgent symptoms. For details visit [email or web address].   Also download the MyChart app! Go to the app store, search "MyChart", open the app, select [facility], and log in with your MyChart username and password.  Due to Covid, a mask is required upon entering the hospital/clinic. If you do not have a mask, one will be given to you upon arrival. For doctor visits, patients may have 1 support person aged 18 or older with them. For treatment visits, patients cannot have anyone with them due to current Covid guidelines and our immunocompromised population.   

## 2021-04-03 ENCOUNTER — Other Ambulatory Visit: Payer: Self-pay

## 2021-04-03 ENCOUNTER — Ambulatory Visit (HOSPITAL_COMMUNITY)
Admission: RE | Admit: 2021-04-03 | Discharge: 2021-04-03 | Disposition: A | Discharge status: Home / Self Care | Payer: Medicaid Other | Source: Ambulatory Visit | Attending: Adult Health | Admitting: Adult Health

## 2021-04-03 DIAGNOSIS — T451X5A Adverse effect of antineoplastic and immunosuppressive drugs, initial encounter: Secondary | ICD-10-CM | POA: Insufficient documentation

## 2021-04-03 DIAGNOSIS — C50412 Malignant neoplasm of upper-outer quadrant of left female breast: Secondary | ICD-10-CM | POA: Diagnosis not present

## 2021-04-03 DIAGNOSIS — Z0189 Encounter for other specified special examinations: Secondary | ICD-10-CM

## 2021-04-03 DIAGNOSIS — Z01818 Encounter for other preprocedural examination: Secondary | ICD-10-CM | POA: Diagnosis present

## 2021-04-03 DIAGNOSIS — G620 Drug-induced polyneuropathy: Secondary | ICD-10-CM | POA: Insufficient documentation

## 2021-04-03 DIAGNOSIS — Z170 Estrogen receptor positive status [ER+]: Secondary | ICD-10-CM | POA: Insufficient documentation

## 2021-04-03 LAB — ECHOCARDIOGRAM COMPLETE
Area-P 1/2: 4.8 cm2
S' Lateral: 3.1 cm

## 2021-04-03 NOTE — Progress Notes (Signed)
  Echocardiogram 2D Echocardiogram has been performed.  Darlina Sicilian M 04/03/2021, 10:47 AM

## 2021-04-06 ENCOUNTER — Other Ambulatory Visit (HOSPITAL_COMMUNITY): Payer: Medicaid Other

## 2021-04-06 NOTE — Progress Notes (Signed)
Gynecologic Oncology Return Clinic Visit  04/07/21  Reason for Visit: post-op follow-up  Treatment History: Oncology History  Malignant neoplasm of upper-outer quadrant of left breast in female, estrogen receptor positive ([place])  05/26/2020 Initial Diagnosis   Malignant neoplasm of upper-outer quadrant of left breast in female, estrogen receptor positive ([place])   06/16/2020 - 09/30/2020 Chemotherapy      Patient is on Antibody Plan: BREAST TRASTUZUMAB Q21D     06/16/2020 Genetic Testing   Positive genetic testing: 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  Variant of uncertain significance in MSH3 at c.2724A>G (Silent).  No other pathogenic or uncertain variants were reported in the [facility] Multi-Cancer Panel.  The report date is June 16, 2020.    The variant of uncertain significance (VUS) in MSH3 at c.2724A>G (Silent) has been reclassified to likely benign.  The change in variant classification was made as a result of re-review of evidence in light of new variant interpretation guidelines and/or new information. The amended report date is January 10, 2021.   The Multi-Cancer Panel offered by [facility] includes sequencing and/or deletion duplication testing of the following 85 genes: AIP, ALK, APC, ATM, AXIN2,BAP1,  BARD1, BLM, BMPR1A, BRCA1, BRCA2, BRIP1, CASR, CDC73, CDH1, CDK4, CDKN1B, CDKN1C, CDKN2A (p14ARF), CDKN2A (p16INK4a), CEBPA, CHEK2, CTNNA1, DICER1, DIS3L2, EGFR (c.2369C>T, p.Thr790Met variant only), EPCAM (Deletion/duplication testing only), FH, FLCN, GATA2, GPC3, GREM1 (Promoter region deletion/duplication testing only), HOXB13 (c.251G>A, p.Gly84Glu), HRAS, KIT, MAX, MEN1, MET, MITF (c.952G>A, p.Glu318Lys variant only), MLH1, MSH2, MSH3, MSH6, MUTYH, NBN, NF1, NF2, NTHL1, PALB2, PDGFRA, PHOX2B, PMS2, POLD1, POLE, POT1, PRKAR1A, PTCH1, PTEN, RAD50, RAD51C, RAD51D, RB1, RECQL4, RET, RNF43, RUNX1, SDHAF2, SDHA (sequence changes only), SDHB, SDHC, SDHD, SMAD4, SMARCA4,  SMARCB1, SMARCE1, STK11, SUFU, TERC, TERT, TMEM127, TP53, TSC1, TSC2, VHL, WRN and WT1.    10/20/2020 - 10/20/2020 Chemotherapy    Patient is on Treatment Plan: BREAST  DOCETAXEL + CARBOPLATIN + TRASTUZUMAB + PERTUZUMAB  (TCHP) Q21D        10/20/2020 -  Chemotherapy   Patient is on Treatment Plan : BREAST Trastuzumab q21d       Interval History: She is s/p TRH/BSO with concurrent cholecystectomy on 9/20.  Reports doing well since surgery.  Denies any significant abdominal or pelvic pain.  Is feeling good and having to remind herself about physical restrictions.  Reports having a good appetite without any nausea or emesis.  Reports regular bowel and bladder function.  Denies any vaginal bleeding or discharge.  Past Medical/Surgical History: Past Medical History:  Diagnosis Date   Arthritis    Breast cancer ([place])    Family history of non-Hodgkin's lymphoma 06/01/2020   GERD (gastroesophageal reflux disease)    Hemorrhoids    Migraines    PCOS (polycystic ovarian syndrome)     Past Surgical History:  Procedure Laterality Date   AXILLARY SENTINEL NODE BIOPSY Left 11/07/2020   Procedure: LEFT AXILLARY SENTINEL NODE BIOPSY;  Surgeon: [name], MD;  Location: [facility];  Service: General;  Laterality: Left;   BREAST RECONSTRUCTION WITH PLACEMENT OF TISSUE EXPANDER AND FLEX HD (ACELLULAR HYDRATED DERMIS) Bilateral 11/07/2020   Procedure: BILATERAL BREAST RECONSTRUCTION WITH PLACEMENT OF TISSUE EXPANDER AND FLEX HD (ACELLULAR HYDRATED DERMIS);  Surgeon: [name], MD;  Location: [facility];  Service: Plastics;  Laterality: Bilateral;   COSMETIC SURGERY     DILATION AND CURETTAGE OF UTERUS     HEMORRHOID SURGERY     IR IMAGING GUIDED PORT INSERTION  06/15/2020   PORT-A-CATH REMOVAL Right 11/07/2020  Procedure: REMOVAL PORT-A-CATH;  Surgeon: [name], MD;  Location: [facility];  Service: General;  Laterality: Right;   ROBOTIC ASSISTED TOTAL HYSTERECTOMY WITH BILATERAL SALPINGO  OOPHERECTOMY Bilateral 03/14/2021   Procedure: XI ROBOTIC ASSISTED TOTAL HYSTERECTOMY WITH BILATERAL SALPINGO OOPHORECTOMY;  Surgeon: Everitt Amber, MD;  Location: [facility];  Service: Gynecology;  Laterality: Bilateral;   TOTAL MASTECTOMY Bilateral 11/07/2020   Procedure: BILATERAL MASTECTOMY;  Surgeon: [name], MD;  Location: [facility];  Service: General;  Laterality: Bilateral;    Family History  Problem Relation Age of Onset   Hypertension Mother    Diabetes Mother    Cancer Mother 73       unknown primary   Other Mother        brain tumor; dx 19s   Non-Hodgkin's lymphoma Brother 37   Other Maternal Uncle 64       brain tumor   Breast cancer Other        MGM's niece; dx late 58s   Pancreatic cancer Neg Hx    Colon cancer Neg Hx    Endometrial cancer Neg Hx    Prostate cancer Neg Hx    Ovarian cancer Neg Hx     Social History   Socioeconomic History   Marital status: Single    Spouse name: Not on file   Number of children: 1   Years of education: Not on file   Highest education level: Some college, no degree  Occupational History   Not on file  Tobacco Use   Smoking status: Former    Packs/day: 0.50    Years: 10.00    Pack years: 5.00    Types: Cigarettes    Quit date: 08/23/2020    Years since quitting: 0.6   Smokeless tobacco: Never  Vaping Use   Vaping Use: Never used  Substance and Sexual Activity   Alcohol use: Yes    Comment: rare   Drug use: Yes    Types: Marijuana    Comment: 5-7 days/week   Sexual activity: Yes    Birth control/protection: None  Other Topics Concern   Not on file  Social History Narrative   Not on file   Social Determinants of Health   Financial Resource Strain: Not on file  Food Insecurity: Not on file  Transportation Needs: No Transportation Needs   Lack of Transportation (Medical): No   Lack of Transportation (Non-Medical): No  Physical Activity: Not on file  Stress: Not on file  Social Connections: Not on file     Current Medications:  Current Outpatient Medications:    acetaminophen (TYLENOL) 650 MG CR tablet, Take 650-1,300 mg by mouth every 8 (eight) hours as needed for pain., Disp: , Rfl:    aspirin-acetaminophen-caffeine (EXCEDRIN MIGRAINE) 250-250-65 MG tablet, Take 2 tablets by mouth every 6 (six) hours as needed for headache., Disp: , Rfl:    BINAXNOW COVID-19 AG HOME TEST KIT, TEST AS DIRECTED TODAY, Disp: , Rfl:    COLLAGEN PO, Take 1 tablet by mouth daily., Disp: , Rfl:    Multiple Vitamins-Minerals (ONE-A-DAY WOMENS PO), Take 1 tablet by mouth daily., Disp: , Rfl:    omeprazole (PRILOSEC) 40 MG capsule, Take 40 mg by mouth daily as needed (acid reflux)., Disp: , Rfl:    NON FORMULARY, Apply 1 application topically 2 (two) times daily. Skinuva scar cream (Patient not taking: Reported on 04/07/2021), Disp: , Rfl:    oxycodone (OXY-IR) 5 MG capsule, Take 5 mg by mouth every 4 (four)  hours as needed for pain. (Patient not taking: Reported on 04/07/2021), Disp: , Rfl:    senna-docusate (SENOKOT-S) 8.6-50 MG tablet, Take 2 tablets by mouth at bedtime. For AFTER surgery only, do not take if having diarrhea (Patient not taking: Reported on 04/07/2021), Disp: 30 tablet, Rfl: 0  Review of Systems: Denies appetite changes, fevers, chills, fatigue, unexplained weight changes. Denies hearing loss, neck lumps or masses, mouth sores, ringing in ears or voice changes. Denies cough or wheezing.  Denies shortness of breath. Denies chest pain or palpitations. Denies leg swelling. Denies abdominal distention, pain, blood in stools, constipation, diarrhea, nausea, vomiting, or early satiety. Denies pain with intercourse, dysuria, frequency, hematuria or incontinence. Denies hot flashes, pelvic pain, vaginal bleeding or vaginal discharge.   Denies joint pain, back pain or muscle pain/cramps. Denies itching, rash, or wounds. Denies dizziness, headaches, numbness or seizures. Denies swollen lymph nodes or  glands, denies easy bruising or bleeding. Denies anxiety, depression, confusion, or decreased concentration.  Physical Exam: BP 118/60 (BP Location: Right Arm, Patient Position: Sitting)   Pulse 78   Temp 98.7 F (37.1 C) (Oral)   Resp 18   Ht 5' 11"  (1.803 m)   Wt (!) 323 lb (146.5 kg)   SpO2 99%   BMI 45.05 kg/m  General: Alert, oriented, no acute distress. HEENT: Normocephalic, atraumatic, sclera anicteric. Chest: Clear to auscultation bilaterally.  No wheezes or rhonchi. Cardiovascular: Regular rate and rhythm, no murmurs. Abdomen: Obese, soft, nontender.  Normoactive bowel sounds.  No masses or hepatosplenomegaly appreciated.  Well-healing upper scopic incisions. Extremities: Grossly normal range of motion.  Warm, well perfused.  No edema bilaterally. Skin: No rashes or lesions noted. GU: Normal appearing external genitalia without erythema, excoriation, or lesions.  Speculum exam reveals no significant discharge or bleeding.  Cuff visually intact with suture still visible.  Bimanual exam reveals cuff intact, no fluctuance or tenderness with palpation.    Laboratory & Radiologic Studies: A. UTERUS, CERVIX, BILATERAL FALLOPIAN TUBES AND OVARIES:  - Cervix      Nabothian cysts.      No dysplasia or malignancy.  - Endometrium      Benign endometrial polyp.      Proliferative endometrium.      No hyperplasia or malignancy.  - Myometrium      Adenomyosis.      No evidence of malignancy.  - Right ovary      Benign follicular cysts.      Focal endosalpingiosis.      No endometriosis or evidence of malignancy.  - Left ovary      Benign follicular cyst.      Focal endosalpingiosis.      No endometriosis or evidence of malignancy.  - Right Fallopian tube      Benign paratubal cyst.      No endometriosis or evidence of malignancy.  - Left Fallopian tube      Unremarkable.      No endometriosis or evidence of malignancy.   B. GALLBLADDER, CHOLECYSTECTOMY:  -  Cholesterolosis.  - Cholelithiasis.  Assessment & Plan: Sheryl Mcmillan is a 45 y.o. woman with personal history of ER positive breast cancer and known pathogenic BRCA1 mutation now status post therapeutic and risk reducing robotic hysterectomy and BSO.  Patient is doing very well from a postoperative standpoint and meeting milestones.  We discussed continued expectations as well as postoperative limitations.  From my risk reducing standpoint, we discussed that while removal of her tubes and ovary significantly  decrease her risk of cancer, there is still a small but present risk of GYN cancer, notably primary peritoneal cancer.  We have no screening tool for this, and I recommend that the patient see either her medical oncologist or primary care provider yearly for exam and review of symptoms.  The patient and I discussed today signs and symptoms that would be concerning for possible cancer, and she knows to call to be evaluated if she develops any of these.  28 minutes of total time was spent for this patient encounter, including preparation, face-to-face counseling with the patient and coordination of care, and documentation of the encounter.  Jeral Pinch, MD  Division of Gynecologic Oncology  Department of Obstetrics and Gynecology  [facility]

## 2021-04-07 ENCOUNTER — Encounter: Payer: Self-pay | Admitting: Gynecologic Oncology

## 2021-04-07 ENCOUNTER — Other Ambulatory Visit: Payer: Self-pay

## 2021-04-07 ENCOUNTER — Inpatient Hospital Stay (HOSPITAL_BASED_OUTPATIENT_CLINIC_OR_DEPARTMENT_OTHER): Payer: Medicaid Other | Admitting: Gynecologic Oncology

## 2021-04-07 VITALS — BP 118/60 | HR 78 | Temp 98.7°F | Resp 18 | Ht 71.0 in | Wt 323.0 lb

## 2021-04-07 DIAGNOSIS — Z90722 Acquired absence of ovaries, bilateral: Secondary | ICD-10-CM

## 2021-04-07 DIAGNOSIS — Z1509 Genetic susceptibility to other malignant neoplasm: Secondary | ICD-10-CM

## 2021-04-07 DIAGNOSIS — Z1501 Genetic susceptibility to malignant neoplasm of breast: Secondary | ICD-10-CM

## 2021-04-07 DIAGNOSIS — Z90710 Acquired absence of both cervix and uterus: Secondary | ICD-10-CM

## 2021-04-07 DIAGNOSIS — Z148 Genetic carrier of other disease: Secondary | ICD-10-CM

## 2021-04-07 DIAGNOSIS — C50412 Malignant neoplasm of upper-outer quadrant of left female breast: Secondary | ICD-10-CM

## 2021-04-07 DIAGNOSIS — Z7189 Other specified counseling: Secondary | ICD-10-CM

## 2021-04-07 DIAGNOSIS — Z1502 Genetic susceptibility to malignant neoplasm of ovary: Secondary | ICD-10-CM

## 2021-04-07 NOTE — Patient Instructions (Signed)
It was nice to meet you today.  You are healing very well from surgery.  Remember, no lifting more than 10 pounds until 6 weeks after surgery and nothing in the vagina for at least 8 weeks.  In terms of follow-up, you will follow-up with your medical oncologist.  We discussed today that while your risk of GYN cancer has been significantly decreased by removal of your tubes and ovaries, there is still a small risk of developing something called primary peritoneal cancer.  We do not do any special testing for this as there is no screening test, but you need to have at least a yearly visit with your medical oncologist or some other doctor where you review symptoms.  Today we discussed some of what the symptoms would be that could signify peritoneal cancer including pelvic/abdominal pain, change to bowel function, unintentional weight loss.  Please call your medical oncologist or my office if you develop any of these.

## 2021-04-12 ENCOUNTER — Encounter: Payer: Self-pay | Admitting: Surgical

## 2021-04-12 ENCOUNTER — Other Ambulatory Visit: Payer: Self-pay

## 2021-04-12 ENCOUNTER — Ambulatory Visit (INDEPENDENT_AMBULATORY_CARE_PROVIDER_SITE_OTHER): Payer: Medicaid Other | Admitting: Surgical

## 2021-04-12 VITALS — BP 116/84 | HR 117 | Ht 71.0 in | Wt 322.8 lb

## 2021-04-12 DIAGNOSIS — C50412 Malignant neoplasm of upper-outer quadrant of left female breast: Secondary | ICD-10-CM

## 2021-04-12 DIAGNOSIS — Z170 Estrogen receptor positive status [ER+]: Secondary | ICD-10-CM

## 2021-04-12 MED ORDER — HYDROCODONE-ACETAMINOPHEN 5-325 MG PO TABS: 1.0000 | ORAL_TABLET | Freq: Four times a day (QID) | ORAL | 0 refills | Status: AC | PRN

## 2021-04-12 MED ORDER — ONDANSETRON HCL 4 MG PO TABS: 4.0000 mg | ORAL_TABLET | Freq: Three times a day (TID) | ORAL | 0 refills | Status: DC | PRN

## 2021-04-12 NOTE — H&P (View-Only) (Signed)
Patient ID: Sheryl Mcmillan, female    DOB: 1975-11-20, 45 y.o.   MRN: [id number]  Chief Complaint  Patient presents with   Pre-op Exam      ICD-10-CM   1. Malignant neoplasm of upper-outer quadrant of left breast in female, estrogen receptor positive ([address])  C50.412    Z17.0       History of Present Illness: Sheryl Mcmillan is a 45 y.o.  female  with a history of bilateral breast reconstruction and placement of tissue expanders.  She presents for preoperative evaluation for upcoming procedure, removal of bilateral tissue expanders with placement of bilateral breast implants, revision of bilateral reconstructed breast with possible Lipo filling and excision of bilateral axillary breast tissue., scheduled for 04/25/2021 with Dr. [name].  The patient has not had problems with anesthesia. No history of DVT/PE.  No family history of DVT/PE.  No family or personal history of bleeding or clotting disorders.  Patient is not currently taking any blood thinners.  No history of CVA/MI.   Summary of Previous Visit: Going to order a larger saline implant in the event that the largest gel implant does not seem to fill out the space and more volume would be necessary.  Current volume of 800 cc in both expanders.  PMH Significant for: Breast cancer, migraines, PCOS, occasional use of THC.  Patient reports she has been feeling well lately.  She has not had any changes in her health.  She reports she tolerated her robotic assisted hysterectomy/salpingo-oophorectomy surgery without any complications.  She is feeling well after this.  She does report that she is able to easily flip the left breast tissue expander.   Past Medical History: Allergies: Allergies  Allergen Reactions   Carboplatin Hives and Itching    Carboplatin reaction after dose #5 requiring Benadryl, Solumedrol, epinephrine, Claritin.   Other Hives    Baking Soda   Wound Dressing Adhesive Hives    Rips skin, prefers paper tape     Current Medications:  Current Outpatient Medications:    acetaminophen (TYLENOL) 650 MG CR tablet, Take 650-1,300 mg by mouth every 8 (eight) hours as needed for pain., Disp: , Rfl:    aspirin-acetaminophen-caffeine (EXCEDRIN MIGRAINE) 250-250-65 MG tablet, Take 2 tablets by mouth every 6 (six) hours as needed for headache., Disp: , Rfl:    BINAXNOW COVID-19 AG HOME TEST KIT, TEST AS DIRECTED TODAY, Disp: , Rfl:    COLLAGEN PO, Take 1 tablet by mouth daily., Disp: , Rfl:    Multiple Vitamins-Minerals (ONE-A-DAY WOMENS PO), Take 1 tablet by mouth daily., Disp: , Rfl:    NON FORMULARY, Apply 1 application topically 2 (two) times daily. Skinuva scar cream, Disp: , Rfl:    omeprazole (PRILOSEC) 40 MG capsule, Take 40 mg by mouth daily as needed (acid reflux)., Disp: , Rfl:    oxycodone (OXY-IR) 5 MG capsule, Take 5 mg by mouth every 4 (four) hours as needed for pain. (Patient not taking: Reported on 04/12/2021), Disp: , Rfl:    senna-docusate (SENOKOT-S) 8.6-50 MG tablet, Take 2 tablets by mouth at bedtime. For AFTER surgery only, do not take if having diarrhea (Patient not taking: Reported on 04/12/2021), Disp: 30 tablet, Rfl: 0  Past Medical Problems: Past Medical History:  Diagnosis Date   Arthritis    Breast cancer ([place])    Family history of non-Hodgkin's lymphoma 06/01/2020   GERD (gastroesophageal reflux disease)    Hemorrhoids    Migraines    PCOS (  polycystic ovarian syndrome)     Past Surgical History: Past Surgical History:  Procedure Laterality Date   AXILLARY SENTINEL NODE BIOPSY Left 11/07/2020   Procedure: LEFT AXILLARY SENTINEL NODE BIOPSY;  Surgeon: [name], MD;  Location: [facility];  Service: General;  Laterality: Left;   BREAST RECONSTRUCTION WITH PLACEMENT OF TISSUE EXPANDER AND FLEX HD (ACELLULAR HYDRATED DERMIS) Bilateral 11/07/2020   Procedure: BILATERAL BREAST RECONSTRUCTION WITH PLACEMENT OF TISSUE EXPANDER AND FLEX HD (ACELLULAR HYDRATED DERMIS);   Surgeon: [name], MD;  Location: [facility];  Service: Plastics;  Laterality: Bilateral;   COSMETIC SURGERY     DILATION AND CURETTAGE OF UTERUS     HEMORRHOID SURGERY     IR IMAGING GUIDED PORT INSERTION  06/15/2020   PORT-A-CATH REMOVAL Right 11/07/2020   Procedure: REMOVAL PORT-A-CATH;  Surgeon: [name], MD;  Location: [facility];  Service: General;  Laterality: Right;   ROBOTIC ASSISTED TOTAL HYSTERECTOMY WITH BILATERAL SALPINGO OOPHERECTOMY Bilateral 03/14/2021   Procedure: XI ROBOTIC ASSISTED TOTAL HYSTERECTOMY WITH BILATERAL SALPINGO OOPHORECTOMY;  Surgeon: Everitt Amber, MD;  Location: [facility];  Service: Gynecology;  Laterality: Bilateral;   TOTAL MASTECTOMY Bilateral 11/07/2020   Procedure: BILATERAL MASTECTOMY;  Surgeon: [name], MD;  Location: [facility];  Service: General;  Laterality: Bilateral;    Social History: Social History   Socioeconomic History   Marital status: Single    Spouse name: Not on file   Number of children: 1   Years of education: Not on file   Highest education level: Some college, no degree  Occupational History   Not on file  Tobacco Use   Smoking status: Former    Packs/day: 0.50    Years: 10.00    Pack years: 5.00    Types: Cigarettes    Quit date: 08/23/2020    Years since quitting: 0.6   Smokeless tobacco: Never  Vaping Use   Vaping Use: Never used  Substance and Sexual Activity   Alcohol use: Yes    Comment: rare   Drug use: Yes    Types: Marijuana    Comment: 5-7 days/week   Sexual activity: Yes    Birth control/protection: None  Other Topics Concern   Not on file  Social History Narrative   Not on file   Social Determinants of Health   Financial Resource Strain: Not on file  Food Insecurity: Not on file  Transportation Needs: No Transportation Needs   Lack of Transportation (Medical): No   Lack of Transportation (Non-Medical): No  Physical Activity: Not on file  Stress: Not on file  Social Connections: Not on  file  Intimate Partner Violence: Not on file    Family History: Family History  Problem Relation Age of Onset   Hypertension Mother    Diabetes Mother    Cancer Mother 73       unknown primary   Other Mother        brain tumor; dx 30s   Non-Hodgkin's lymphoma Brother 4   Other Maternal Uncle 8       brain tumor   Breast cancer Other        MGM's niece; dx late 48s   Pancreatic cancer Neg Hx    Colon cancer Neg Hx    Endometrial cancer Neg Hx    Prostate cancer Neg Hx    Ovarian cancer Neg Hx     Review of Systems: Review of Systems  Constitutional: Negative.   Respiratory: Negative.    Cardiovascular: Negative.  Gastrointestinal: Negative.   Genitourinary: Negative.    Physical Exam: Vital Signs BP 116/84 (BP Location: Right Arm, Patient Position: Sitting, Cuff Size: Large)   Pulse (!) 117   Ht _0  (1.803 m)   Wt (!) 322 lb 12.8 oz (146.4 kg)   LMP 09/29/2020 (Approximate)   SpO2 98%   BMI 45.02 kg/m   Physical Exam  Constitutional:      General: Not in acute distress.    Appearance: Normal appearance. Not ill-appearing.  HENT:     Head: Normocephalic and atraumatic.  Eyes:     Pupils: Pupils are equal, round Neck:     Musculoskeletal: Normal range of motion.  Cardiovascular:     Rate and Rhythm: Normal rate    Pulses: Normal pulses.  Pulmonary:     Effort: Pulmonary effort is normal. No respiratory distress.  Abdominal:     General: Abdomen is flat. There is no distension.  Musculoskeletal: Normal range of motion.  Skin:    General: Skin is warm and dry.     Findings: No erythema or rash.  Neurological:     General: No focal deficit present.     Mental Status: Alert and oriented to person, place, and time. Mental status is at baseline.     Motor: No weakness.  Psychiatric:        Mood and Affect: Mood normal.        Behavior: Behavior normal.    Assessment/Plan: The patient is scheduled for removal of bilateral tissue expanders and  placement of bilateral breast implants, revision of bilateral reconstructed breast with Lipo filling and excision of bilateral axillary breast tissue with Dr. [name].  Risks, benefits, and alternatives of procedure discussed, questions answered and consent obtained.    Smoking Status: Reports use of THC, no tobacco use; Counseling Given?  Discussed with patient recommendations for stopping THC prior to and after surgery, patient agreed and understood.  Caprini Score: 7, high; Risk Factors include: Age, BMI greater than 40, smoking marijuana, previous breast cancer and length of planned surgery. Recommendation for mechanical and pharmacological prophylaxis. Encourage early ambulation.  Will discuss postop Lovenox with Dr. [name].  Pictures obtained: 03/23/21  Post-op Rx sent to pharmacy: Norco, Zofran  Patient was provided with the General Surgical Risk consent document and Pain Medication Agreement prior to their appointment.  They had adequate time to read through the risk consent documents and Pain Medication Agreement. We also discussed them in person together during this preop appointment. All of their questions were answered to their satisfaction.  Recommended calling if they have any further questions.  Risk consent form and Pain Medication Agreement to be scanned into patient's chart.  Patient was provided with the [employer] implant patient decision checklist and this was completed during today's preoperative evaluation. Patient had time to read through the information and any questions were answered to their content. Form will be scanned into patient's chart.  The risks that can be encountered with and after placement of a breast implant were discussed and include the following but not limited to these: bleeding, infection, delayed healing, anesthesia risks, skin sensation changes, injury to structures including nerves, blood vessels, and muscles which may be temporary or permanent, allergies to tape,  suture materials and glues, blood products, topical preparations or injected agents, skin contour irregularities, skin discoloration and swelling, deep vein thrombosis, cardiac and pulmonary complications, pain, which may persist, fluid accumulation, wrinkling of the skin over the implanmt, changes in nipple or breast  sensation, implant leakage or rupture, faulty position of the implant, persistent pain, formation of tight scar tissue around the implant (capsular contracture).  The risks that can be encountered with and after liposuction were discussed and include the following but no limited to these:  Asymmetry, fluid accumulation, firmness of the area, fat necrosis with death of fat tissue, bleeding, infection, delayed healing, anesthesia risks, skin sensation changes, injury to structures including nerves, blood vessels, and muscles which may be temporary or permanent, allergies to tape, suture materials and glues, blood products, topical preparations or injected agents, skin and contour irregularities, skin discoloration and swelling, deep vein thrombosis, cardiac and pulmonary complications, pain, which may persist, persistent pain, recurrence of the lesion, poor healing of the incision, possible need for revisional surgery or staged procedures. Thiere can also be persistent swelling, poor wound healing, rippling or loose skin, worsening of cellulite, swelling, and thermal burn or heat injury from ultrasound with the ultrasound-assisted lipoplasty technique. Any change in weight fluctuations can alter the outcome.   Electronically signed by: Carola Rhine Herchel Hopkin, PA-C 04/12/2021 3:00 PM

## 2021-04-12 NOTE — Progress Notes (Signed)
Patient ID: Sheryl Mcmillan, female    DOB: 1975-11-20, 45 y.o.   MRN: [id number]  Chief Complaint  Patient presents with   Pre-op Exam      ICD-10-CM   1. Malignant neoplasm of upper-outer quadrant of left breast in female, estrogen receptor positive ([address])  C50.412    Z17.0       History of Present Illness: Sheryl Mcmillan is a 45 y.o.  female  with a history of bilateral breast reconstruction and placement of tissue expanders.  She presents for preoperative evaluation for upcoming procedure, removal of bilateral tissue expanders with placement of bilateral breast implants, revision of bilateral reconstructed breast with possible Lipo filling and excision of bilateral axillary breast tissue., scheduled for 04/25/2021 with Dr. [name].  The patient has not had problems with anesthesia. No history of DVT/PE.  No family history of DVT/PE.  No family or personal history of bleeding or clotting disorders.  Patient is not currently taking any blood thinners.  No history of CVA/MI.   Summary of Previous Visit: Going to order a larger saline implant in the event that the largest gel implant does not seem to fill out the space and more volume would be necessary.  Current volume of 800 cc in both expanders.  PMH Significant for: Breast cancer, migraines, PCOS, occasional use of THC.  Patient reports she has been feeling well lately.  She has not had any changes in her health.  She reports she tolerated her robotic assisted hysterectomy/salpingo-oophorectomy surgery without any complications.  She is feeling well after this.  She does report that she is able to easily flip the left breast tissue expander.   Past Medical History: Allergies: Allergies  Allergen Reactions   Carboplatin Hives and Itching    Carboplatin reaction after dose #5 requiring Benadryl, Solumedrol, epinephrine, Claritin.   Other Hives    Baking Soda   Wound Dressing Adhesive Hives    Rips skin, prefers paper tape     Current Medications:  Current Outpatient Medications:    acetaminophen (TYLENOL) 650 MG CR tablet, Take 650-1,300 mg by mouth every 8 (eight) hours as needed for pain., Disp: , Rfl:    aspirin-acetaminophen-caffeine (EXCEDRIN MIGRAINE) 250-250-65 MG tablet, Take 2 tablets by mouth every 6 (six) hours as needed for headache., Disp: , Rfl:    BINAXNOW COVID-19 AG HOME TEST KIT, TEST AS DIRECTED TODAY, Disp: , Rfl:    COLLAGEN PO, Take 1 tablet by mouth daily., Disp: , Rfl:    Multiple Vitamins-Minerals (ONE-A-DAY WOMENS PO), Take 1 tablet by mouth daily., Disp: , Rfl:    NON FORMULARY, Apply 1 application topically 2 (two) times daily. Skinuva scar cream, Disp: , Rfl:    omeprazole (PRILOSEC) 40 MG capsule, Take 40 mg by mouth daily as needed (acid reflux)., Disp: , Rfl:    oxycodone (OXY-IR) 5 MG capsule, Take 5 mg by mouth every 4 (four) hours as needed for pain. (Patient not taking: Reported on 04/12/2021), Disp: , Rfl:    senna-docusate (SENOKOT-S) 8.6-50 MG tablet, Take 2 tablets by mouth at bedtime. For AFTER surgery only, do not take if having diarrhea (Patient not taking: Reported on 04/12/2021), Disp: 30 tablet, Rfl: 0  Past Medical Problems: Past Medical History:  Diagnosis Date   Arthritis    Breast cancer ([place])    Family history of non-Hodgkin's lymphoma 06/01/2020   GERD (gastroesophageal reflux disease)    Hemorrhoids    Migraines    PCOS (  polycystic ovarian syndrome)     Past Surgical History: Past Surgical History:  Procedure Laterality Date   AXILLARY SENTINEL NODE BIOPSY Left 11/07/2020   Procedure: LEFT AXILLARY SENTINEL NODE BIOPSY;  Surgeon: [name], MD;  Location: [facility];  Service: General;  Laterality: Left;   BREAST RECONSTRUCTION WITH PLACEMENT OF TISSUE EXPANDER AND FLEX HD (ACELLULAR HYDRATED DERMIS) Bilateral 11/07/2020   Procedure: BILATERAL BREAST RECONSTRUCTION WITH PLACEMENT OF TISSUE EXPANDER AND FLEX HD (ACELLULAR HYDRATED DERMIS);   Surgeon: [name], MD;  Location: [facility];  Service: Plastics;  Laterality: Bilateral;   COSMETIC SURGERY     DILATION AND CURETTAGE OF UTERUS     HEMORRHOID SURGERY     IR IMAGING GUIDED PORT INSERTION  06/15/2020   PORT-A-CATH REMOVAL Right 11/07/2020   Procedure: REMOVAL PORT-A-CATH;  Surgeon: [name], MD;  Location: [facility];  Service: General;  Laterality: Right;   ROBOTIC ASSISTED TOTAL HYSTERECTOMY WITH BILATERAL SALPINGO OOPHERECTOMY Bilateral 03/14/2021   Procedure: XI ROBOTIC ASSISTED TOTAL HYSTERECTOMY WITH BILATERAL SALPINGO OOPHORECTOMY;  Surgeon: Everitt Amber, MD;  Location: [facility];  Service: Gynecology;  Laterality: Bilateral;   TOTAL MASTECTOMY Bilateral 11/07/2020   Procedure: BILATERAL MASTECTOMY;  Surgeon: [name], MD;  Location: [facility];  Service: General;  Laterality: Bilateral;    Social History: Social History   Socioeconomic History   Marital status: Single    Spouse name: Not on file   Number of children: 1   Years of education: Not on file   Highest education level: Some college, no degree  Occupational History   Not on file  Tobacco Use   Smoking status: Former    Packs/day: 0.50    Years: 10.00    Pack years: 5.00    Types: Cigarettes    Quit date: 08/23/2020    Years since quitting: 0.6   Smokeless tobacco: Never  Vaping Use   Vaping Use: Never used  Substance and Sexual Activity   Alcohol use: Yes    Comment: rare   Drug use: Yes    Types: Marijuana    Comment: 5-7 days/week   Sexual activity: Yes    Birth control/protection: None  Other Topics Concern   Not on file  Social History Narrative   Not on file   Social Determinants of Health   Financial Resource Strain: Not on file  Food Insecurity: Not on file  Transportation Needs: No Transportation Needs   Lack of Transportation (Medical): No   Lack of Transportation (Non-Medical): No  Physical Activity: Not on file  Stress: Not on file  Social Connections: Not on  file  Intimate Partner Violence: Not on file    Family History: Family History  Problem Relation Age of Onset   Hypertension Mother    Diabetes Mother    Cancer Mother 73       unknown primary   Other Mother        brain tumor; dx 30s   Non-Hodgkin's lymphoma Brother 4   Other Maternal Uncle 8       brain tumor   Breast cancer Other        MGM's niece; dx late 48s   Pancreatic cancer Neg Hx    Colon cancer Neg Hx    Endometrial cancer Neg Hx    Prostate cancer Neg Hx    Ovarian cancer Neg Hx     Review of Systems: Review of Systems  Constitutional: Negative.   Respiratory: Negative.    Cardiovascular: Negative.  Gastrointestinal: Negative.   Genitourinary: Negative.    Physical Exam: Vital Signs BP 116/84 (BP Location: Right Arm, Patient Position: Sitting, Cuff Size: Large)   Pulse (!) 117   Ht _0  (1.803 m)   Wt (!) 322 lb 12.8 oz (146.4 kg)   LMP 09/29/2020 (Approximate)   SpO2 98%   BMI 45.02 kg/m   Physical Exam  Constitutional:      General: Not in acute distress.    Appearance: Normal appearance. Not ill-appearing.  HENT:     Head: Normocephalic and atraumatic.  Eyes:     Pupils: Pupils are equal, round Neck:     Musculoskeletal: Normal range of motion.  Cardiovascular:     Rate and Rhythm: Normal rate    Pulses: Normal pulses.  Pulmonary:     Effort: Pulmonary effort is normal. No respiratory distress.  Abdominal:     General: Abdomen is flat. There is no distension.  Musculoskeletal: Normal range of motion.  Skin:    General: Skin is warm and dry.     Findings: No erythema or rash.  Neurological:     General: No focal deficit present.     Mental Status: Alert and oriented to person, place, and time. Mental status is at baseline.     Motor: No weakness.  Psychiatric:        Mood and Affect: Mood normal.        Behavior: Behavior normal.    Assessment/Plan: The patient is scheduled for removal of bilateral tissue expanders and  placement of bilateral breast implants, revision of bilateral reconstructed breast with Lipo filling and excision of bilateral axillary breast tissue with Dr. [name].  Risks, benefits, and alternatives of procedure discussed, questions answered and consent obtained.    Smoking Status: Reports use of THC, no tobacco use; Counseling Given?  Discussed with patient recommendations for stopping THC prior to and after surgery, patient agreed and understood.  Caprini Score: 7, high; Risk Factors include: Age, BMI greater than 40, smoking marijuana, previous breast cancer and length of planned surgery. Recommendation for mechanical and pharmacological prophylaxis. Encourage early ambulation.  Will discuss postop Lovenox with Dr. [name].  Pictures obtained: 03/23/21  Post-op Rx sent to pharmacy: Norco, Zofran  Patient was provided with the General Surgical Risk consent document and Pain Medication Agreement prior to their appointment.  They had adequate time to read through the risk consent documents and Pain Medication Agreement. We also discussed them in person together during this preop appointment. All of their questions were answered to their satisfaction.  Recommended calling if they have any further questions.  Risk consent form and Pain Medication Agreement to be scanned into patient's chart.  Patient was provided with the [employer] implant patient decision checklist and this was completed during today's preoperative evaluation. Patient had time to read through the information and any questions were answered to their content. Form will be scanned into patient's chart.  The risks that can be encountered with and after placement of a breast implant were discussed and include the following but not limited to these: bleeding, infection, delayed healing, anesthesia risks, skin sensation changes, injury to structures including nerves, blood vessels, and muscles which may be temporary or permanent, allergies to tape,  suture materials and glues, blood products, topical preparations or injected agents, skin contour irregularities, skin discoloration and swelling, deep vein thrombosis, cardiac and pulmonary complications, pain, which may persist, fluid accumulation, wrinkling of the skin over the implanmt, changes in nipple or breast  sensation, implant leakage or rupture, faulty position of the implant, persistent pain, formation of tight scar tissue around the implant (capsular contracture).  The risks that can be encountered with and after liposuction were discussed and include the following but no limited to these:  Asymmetry, fluid accumulation, firmness of the area, fat necrosis with death of fat tissue, bleeding, infection, delayed healing, anesthesia risks, skin sensation changes, injury to structures including nerves, blood vessels, and muscles which may be temporary or permanent, allergies to tape, suture materials and glues, blood products, topical preparations or injected agents, skin and contour irregularities, skin discoloration and swelling, deep vein thrombosis, cardiac and pulmonary complications, pain, which may persist, persistent pain, recurrence of the lesion, poor healing of the incision, possible need for revisional surgery or staged procedures. Thiere can also be persistent swelling, poor wound healing, rippling or loose skin, worsening of cellulite, swelling, and thermal burn or heat injury from ultrasound with the ultrasound-assisted lipoplasty technique. Any change in weight fluctuations can alter the outcome.   Electronically signed by: Carola Rhine Jerik Falletta, PA-C 04/12/2021 3:00 PM

## 2021-04-13 ENCOUNTER — Encounter (HOSPITAL_BASED_OUTPATIENT_CLINIC_OR_DEPARTMENT_OTHER): Payer: Self-pay | Admitting: Plastic Surgery

## 2021-04-13 ENCOUNTER — Other Ambulatory Visit: Payer: Self-pay

## 2021-04-13 ENCOUNTER — Ambulatory Visit: Payer: Medicaid Other

## 2021-04-13 ENCOUNTER — Other Ambulatory Visit: Payer: Medicaid Other

## 2021-04-20 ENCOUNTER — Other Ambulatory Visit: Payer: Medicaid Other

## 2021-04-20 ENCOUNTER — Inpatient Hospital Stay: Payer: Medicaid Other

## 2021-04-20 ENCOUNTER — Ambulatory Visit: Payer: Medicaid Other

## 2021-04-20 ENCOUNTER — Encounter: Payer: Self-pay | Admitting: *Deleted

## 2021-04-20 ENCOUNTER — Inpatient Hospital Stay (HOSPITAL_BASED_OUTPATIENT_CLINIC_OR_DEPARTMENT_OTHER): Payer: Medicaid Other | Admitting: Oncology

## 2021-04-20 ENCOUNTER — Other Ambulatory Visit: Payer: Self-pay

## 2021-04-20 VITALS — BP 117/84 | HR 99 | Temp 98.2°F | Resp 18

## 2021-04-20 VITALS — BP 119/80 | HR 85 | Temp 97.7°F | Resp 16 | Ht 71.0 in | Wt 319.7 lb

## 2021-04-20 DIAGNOSIS — Z1509 Genetic susceptibility to other malignant neoplasm: Secondary | ICD-10-CM

## 2021-04-20 DIAGNOSIS — C50412 Malignant neoplasm of upper-outer quadrant of left female breast: Secondary | ICD-10-CM

## 2021-04-20 DIAGNOSIS — Z170 Estrogen receptor positive status [ER+]: Secondary | ICD-10-CM

## 2021-04-20 DIAGNOSIS — Z5112 Encounter for antineoplastic immunotherapy: Secondary | ICD-10-CM | POA: Diagnosis not present

## 2021-04-20 DIAGNOSIS — Z1501 Genetic susceptibility to malignant neoplasm of breast: Secondary | ICD-10-CM

## 2021-04-20 LAB — CMP (CANCER CENTER ONLY)
ALT: 35 U/L (ref 0–44)
AST: 27 U/L (ref 15–41)
Albumin: 3.7 g/dL (ref 3.5–5.0)
Alkaline Phosphatase: 95 U/L (ref 38–126)
Anion gap: 11 (ref 5–15)
BUN: 13 mg/dL (ref 6–20)
CO2: 22 mmol/L (ref 22–32)
Calcium: 9.1 mg/dL (ref 8.9–10.3)
Chloride: 109 mmol/L (ref 98–111)
Creatinine: 0.81 mg/dL (ref 0.44–1.00)
GFR, Estimated: >60 mL/min (ref >60)
Glucose, Bld: 143 mg/dL — ABNORMAL HIGH (ref 70–99)
Potassium: 3.9 mmol/L (ref 3.5–5.1)
Sodium: 142 mmol/L (ref 135–145)
Total Bilirubin: 0.3 mg/dL (ref 0.3–1.2)
Total Protein: 6.8 g/dL (ref 6.5–8.1)

## 2021-04-20 LAB — CBC WITH DIFFERENTIAL (CANCER CENTER ONLY)
Abs Immature Granulocytes: 0.02 10*3/uL (ref 0.00–0.07)
Basophils Absolute: 0 10*3/uL (ref 0.0–0.1)
Basophils Relative: 0 %
Eosinophils Absolute: 0.2 10*3/uL (ref 0.0–0.5)
Eosinophils Relative: 3 %
HCT: 40 % (ref 36.0–46.0)
Hemoglobin: 12.9 g/dL (ref 12.0–15.0)
Immature Granulocytes: 0 %
Lymphocytes Relative: 46 %
Lymphs Abs: 3.7 10*3/uL (ref 0.7–4.0)
MCH: 26.1 pg (ref 26.0–34.0)
MCHC: 32.3 g/dL (ref 30.0–36.0)
MCV: 81 fL (ref 80.0–100.0)
Monocytes Absolute: 0.4 10*3/uL (ref 0.1–1.0)
Monocytes Relative: 5 %
Neutro Abs: 3.7 10*3/uL (ref 1.7–7.7)
Neutrophils Relative %: 46 %
Platelet Count: 266 10*3/uL (ref 150–400)
RBC: 4.94 MIL/uL (ref 3.87–5.11)
RDW: 16.3 % — ABNORMAL HIGH (ref 11.5–15.5)
WBC Count: 8 10*3/uL (ref 4.0–10.5)
nRBC: 0 % (ref 0.0–0.2)

## 2021-04-20 MED ORDER — VENLAFAXINE HCL ER 37.5 MG PO CP24
37.5000 mg | ORAL_CAPSULE | Freq: Every day | ORAL | 4 refills | Status: DC
Start: 2021-04-20 — End: 2021-06-30

## 2021-04-20 MED ORDER — TRASTUZUMAB-HYALURONIDASE-OYSK 600-10000 MG-UNT/5ML ~~LOC~~ SOLN
600.0000 mg | Freq: Once | SUBCUTANEOUS | Status: AC
  Administered 2021-04-20: 600 mg via SUBCUTANEOUS
  Filled 2021-04-20: qty 5

## 2021-04-20 NOTE — Progress Notes (Signed)
[place]  [phone] [phone]    ID: Sheryl Mcmillan DOB: 06/27/1975  MR#: [id number]  YTW#:[id number]  Patient Care Team: Windell Hummingbird, PA-C as PCP - General (Physician Assistant) [name], RN as Oncology Nurse Navigator [name], RN as Oncology Nurse Navigator [name], MD as Consulting Physician (General Surgery) Bryker Fletchall, [name], MD as Consulting Physician (Oncology) [name], MD as Consulting Physician (Radiation Oncology) [name], MD as Consulting Physician (Plastic Surgery) Scot Dock, NP OTHER MD:  CHIEF COMPLAINT: estrogen receptor negative, Her2 positive breast cancer (s/Mcmillan bilateral mastectomies); BRCA1+  CURRENT TREATMENT: Trastuzumab   INTERVAL HISTORY: Sheryl Mcmillan returns today for follow up and treatment of her estrogen receptor negative, Her2 positive breast cancer accompanied by her daughter (who incidentally is BRCA negative)  Sheryl Mcmillan is now receiving trastuzumab alone, to complete a year.  Her port was removed and she is receiving this subcutaneously as Hylecta.  She tolerates this well and has no symptoms suggestive of heart failure..    Her most recent echo was completed on 04/03/2021 and showed an ejection fraction of 55-60%, which is overall stable from prior.  However her baseline echo dose had been in the 65-70% range.  In between of course she had bilateral mastectomies with expanders in place and that may change the echo technique and possibly the sensitivity.  Since her last visit, she underwent hysterectomy with BSO on 03/14/2021 under Dr. Denman George. Pathology from the procedure ([id number]) was benign.  Cholecystectomy was performed at the same time under Dr. Hassell Done.   REVIEW OF SYSTEMS: Sheryl Mcmillan is still recovering from her surgeries although she has tolerated her treatments remarkably well.  She is scheduled for definitive implant reconstruction early November.  Because of the  surgeries she is limited in her exercise activity and she is not doing very much at present.  She feels very irritable and very emotional.  A detailed review of systems was otherwise stable    COVID 19 VACCINATION STATUS: Status post vaccine x2, no booster as of February 2022   HISTORY OF CURRENT ILLNESS: From the original intake note:   Sheryl Mcmillan presented with a palpable lump in the outer left breast, which she initially felt approximately 6 months prior. She waited to seek medical attention because she had no insurance.  Eventually she enrolled in the [facility] program and underwent bilateral diagnostic mammography with tomography and left breast ultrasonography at [facility] on 05/10/2020 showing: breast density category B; vaguely palpable 2.5 cm mass in left breast at 1:30; no pathologic left axillary lymphadenopathy or evidence of right breast malignancy.  Accordingly on 05/18/2020 she proceeded to biopsy of the left breast area in question. The pathology from this procedure ([id number]) showed: invasive ductal carcinoma, grade 3. Prognostic indicators significant for: estrogen receptor, 40% positive with weak staining intensity and progesterone receptor, 0% negative. Proliferation marker Ki67 at 50%. HER2 positive by immunohistochemistry (3+).  The patient's subsequent history is as detailed below.   PAST MEDICAL HISTORY: Past Medical History:  Diagnosis Date   Arthritis    Breast cancer ([place])    Family history of non-Hodgkin's lymphoma 06/01/2020   GERD (gastroesophageal reflux disease)    Hemorrhoids    Migraines    PCOS (polycystic ovarian syndrome)     PAST SURGICAL HISTORY: Past Surgical History:  Procedure Laterality Date   AXILLARY SENTINEL NODE BIOPSY Left 11/07/2020   Procedure: LEFT AXILLARY SENTINEL NODE BIOPSY;  Surgeon: [name], MD;  Location: [facility];  Service: General;  Laterality: Left;   BREAST RECONSTRUCTION WITH PLACEMENT OF TISSUE EXPANDER  AND FLEX HD (ACELLULAR HYDRATED DERMIS) Bilateral 11/07/2020   Procedure: BILATERAL BREAST RECONSTRUCTION WITH PLACEMENT OF TISSUE EXPANDER AND FLEX HD (ACELLULAR HYDRATED DERMIS);  Surgeon: [name], MD;  Location: [facility];  Service: Plastics;  Laterality: Bilateral;   COSMETIC SURGERY     DILATION AND CURETTAGE OF UTERUS     HEMORRHOID SURGERY     IR IMAGING GUIDED PORT INSERTION  06/15/2020   PORT-A-CATH REMOVAL Right 11/07/2020   Procedure: REMOVAL PORT-A-CATH;  Surgeon: [name], MD;  Location: [facility];  Service: General;  Laterality: Right;   TOTAL MASTECTOMY Bilateral 11/07/2020   Procedure: BILATERAL MASTECTOMY;  Surgeon: [name], MD;  Location: [facility];  Service: General;  Laterality: Bilateral;    FAMILY HISTORY: Family History  Problem Relation Age of Onset   Hypertension Mother    Diabetes Mother    Cancer Mother 62       unknown primary   Other Mother        brain tumor; dx 69s   Non-Hodgkin's lymphoma Brother 71   Other Maternal Uncle 63       brain tumor   Breast cancer Other        MGM's niece; dx late 88s   Pancreatic cancer Neg Hx    Colon cancer Neg Hx    Endometrial cancer Neg Hx    Prostate cancer Neg Hx    Ovarian cancer Neg Hx    She has no information on her father. Her mother died at age 130 from stage IV cancer. The primary cancer was unknown, but it was not breast.  Sheryl Mcmillan has two half brothers (through her mother), one of which was diagnosed with non-Hodgkin's lymphoma. There is no family history of breast, ovarian, or prostate cancer to her knowledge. // Patient is BRCA1 positive. Daughter tested negative.   GYNECOLOGIC HISTORY:  No LMP recorded. Menarche: 45 years old Age at first live birth: 45 years old Sheryl Mcmillan 1 LMP 05/02/2020 Contraceptive: never used HRT n/a  Hysterectomy? no BSO? 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 She is single. Her significant  other [name] is a Freight forwarder. She lives at home with [name], her daughter [name],  [name]'s daughter [name] and her 40-monthold daughter [name] Daughter [name] age 44 is a bChief Operating Officerat T[employer]    ADVANCED DIRECTIVES: Completed and notarized 09/07/2020.  She has named her aunt TMarveen Reeksas healthcare power of attorney   HEALTH MAINTENANCE: Social History   Tobacco Use   Smoking status: Former    Packs/day: 0.50    Years: 10.00    Pack years: 5.00    Types: Cigarettes    Quit date: 08/23/2020    Years since quitting: 0.5   Smokeless tobacco: Never  Vaping Use   Vaping Use: Never used  Substance Use Topics   Alcohol use: Yes    Comment: rare   Drug use: Yes    Types: Marijuana    Comment: 5-7 days/week     Colonoscopy: 2018  PAP: 2018  Bone density: n/a (age)   Allergies  Allergen Reactions   Carboplatin Hives and Itching    Carboplatin reaction after dose #5 requiring Benadryl, Solumedrol, epinephrine, Claritin.   Other Hives    Baking [place]ren [place]  Wound Dressing Adhesive Hives    Rips skin, prefers paper tape    Current Outpatient Medications  Medication Sig Dispense Refill   acetaminophen (TYLENOL) 650 MG CR tablet Take 650-1,300 mg by mouth every 8 (eight) hours as needed for pain.     aspirin-acetaminophen-caffeine (EXCEDRIN MIGRAINE) 250-250-65 MG tablet Take 2 tablets by mouth every 6 (six) hours as needed for headache.     COLLAGEN PO Take 1 tablet by mouth daily.     Multiple Vitamins-Minerals (ONE-A-DAY WOMENS PO) Take 1 tablet by mouth daily.     NON FORMULARY Apply 1 application topically 2 (two) times daily. Skinuva scar cream     omeprazole (PRILOSEC) 40 MG capsule Take 40 mg by mouth daily as needed (acid reflux).     oxycodone (OXY-IR) 5 MG capsule Take 5 mg by mouth every 4 (four) hours as needed for pain. (Patient not taking: Reported on 02/23/2021)     senna-docusate (SENOKOT-S)  8.6-50 MG tablet Take 2 tablets by mouth at bedtime. For AFTER surgery only, do not take if having diarrhea 30 tablet 0   No current facility-administered medications for this visit.    OBJECTIVE: White woman who appears stated age 45:   03/09/21 1306  BP: 128/65  Pulse: 83  Resp: 20  Temp: 97.7 F (36.5 C)  SpO2: 98%    Body mass index is 44.74 kg/m. Filed Weights   03/09/21 1306  Weight: (!) 320 lb 12.8 oz (145.5 kg)   Sclerae unicteric, EOMs intact Wearing a mask No cervical or supraclavicular adenopathy Lungs no rales or rhonchi Heart regular rate and rhythm Abd soft, obese, nontender, positive bowel sounds MSK no focal spinal tenderness, no upper extremity lymphedema Neuro: nonfocal, well oriented, appropriate affect Breasts: Status post bilateral mastectomies with bilateral expanders in place.  The initial impression is symmetrical.  There is no dehiscence erythema or swelling.  Both axillae are benign.   LAB RESULTS:  CMP     Component Value Date/Time   NA 140 03/09/2021 1236   K 4.0 03/09/2021 1236   CL 107 03/09/2021 1236   CO2 24 03/09/2021 1236   GLUCOSE 155 (H) 03/09/2021 1236   BUN 8 03/09/2021 1236   CREATININE 0.82 03/09/2021 1236   CALCIUM 9.1 03/09/2021 1236   PROT 6.8 03/09/2021 1236   ALBUMIN 3.6 03/09/2021 1236   AST 24 03/09/2021 1236   ALT 33 03/09/2021 1236   ALKPHOS 96 03/09/2021 1236   BILITOT 0.2 (L) 03/09/2021 1236   GFRNONAA >60 03/09/2021 1236   GFRAA >60 06/06/2019 1205    No results found for: TOTALPROTELP, ALBUMINELP, A1GS, A2GS, BETS, BETA2SER, GAMS, MSPIKE, SPEI  Lab Results  Component Value Date   WBC 9.0 03/09/2021   NEUTROABS 4.8 03/09/2021   HGB 12.6 03/09/2021   HCT 40.1 03/09/2021   MCV 82.3 03/09/2021   PLT 312 03/09/2021    No results found for: LABCA2  No components found for: GGYIRS854  No results for input(s): INR in the last 168 hours.  No results found for: LABCA2  No results found for:  CAN199  Lab Results  Component Value Date   CAN125 7.4 12/15/2020    No results found for: OEV035  No results found for: CA2729  No components found for: HGQUANT  No results found for: CEA1 / No results found for: CEA1   No results found for: AFPTUMOR  No results found for: CHROMOGRNA  No results found for: KPAFRELGTCHN, LAMBDASER, KAPLAMBRATIO (kappa/lambda light chains)  No results found for: HGBA, HGBA2QUANT, HGBFQUANT, HGBSQUAN (Hemoglobinopathy evaluation)   No results found for: LDH  No results found for: IRON, TIBC, IRONPCTSAT (Iron and TIBC)  No results found for: FERRITIN  Urinalysis    Component Value Date/Time   COLORURINE YELLOW 06/06/2019 [place] 06/06/2019 1205   LABSPEC >1.030 (H) 06/06/2019 1205   PHURINE 5.5 06/06/2019 [place] 06/06/2019 1205   HGBUR TRACE (A) 06/06/2019 1205   BILIRUBINUR NEGATIVE 06/06/2019 [place] 06/06/2019 [place] 06/06/2019 1205   UROBILINOGEN 1.0 05/09/2011 1805   NITRITE NEGATIVE 06/06/2019 1205   LEUKOCYTESUR NEGATIVE 06/06/2019 1205    STUDIES: No results found.   ELIGIBLE FOR AVAILABLE RESEARCH PROTOCOL: AET  ASSESSMENT: 45 y.o. BRCA positive [place] woman status post left breast upper outer quadrant biopsy 05/18/2020 for a clinical T2N0, stage IIA invasive ductal carcinoma, grade 3, estrogen receptor weakly positive, progesterone receptor negative, with an MIB-1 of 50%, and HER-2 positive by immunohistochemistry  (1) genetics testing 06/16/2020 shows a pathogenic mutation in BRCA1 at c.2475del (Mcmillan.Asp825Glufs*21)  (a)  Variant of uncertain significance in MSH3 at c.2724A>G (Silent).  (b) no additional deleterious mutations through the Multi-Cancer Panel offered by [facility] including AIP, ALK, APC, ATM, AXIN2,BAP1,  BARD1, BLM, BMPR1A, BRCA1, BRCA2, BRIP1, CASR, CDC73, CDH1, CDK4, CDKN1B, CDKN1C, CDKN2A (p14ARF), CDKN2A (p16INK4a), CEBPA,  CHEK2, CTNNA1, DICER1, DIS3L2, EGFR (c.2369C>T, Mcmillan.Thr790Met variant only), EPCAM (Deletion/duplication testing only), FH, FLCN, GATA2, GPC3, GREM1 (Promoter region deletion/duplication testing only), HOXB13 (c.251G>A, Mcmillan.Gly84Glu), HRAS, KIT, MAX, MEN1, MET, MITF (c.952G>A, Mcmillan.Glu318Lys variant only), MLH1, MSH2, MSH3, MSH6, MUTYH, NBN, NF1, NF2, NTHL1, PALB2, PDGFRA, PHOX2B, PMS2, POLD1, POLE, POT1, PRKAR1A, PTCH1, PTEN, RAD50, RAD51C, RAD51D, RB1, RECQL4, RET, RNF43, RUNX1, SDHAF2, SDHA (sequence changes only), SDHB, SDHC, SDHD, SMAD4, SMARCA4, SMARCB1, SMARCE1, STK11, SUFU, TERC, TERT, TMEM127, TP53, TSC1, TSC2, VHL, WRN and WT1.   (2) neoadjuvant chemotherapy to consist of carboplatin and docetaxel together with trastuzumab and Pertuzumab every 3 weeks x 6, starting 06/16/2020, completed 09/28/2020   (a) pertuzumab omitted after cycle 1  (3) trastuzumab to be continued to complete 1 year (through December 2022)  (a) echo 06/09/2020 shows EF in the 60-65% range  (b) echo 09/14/2020 shows an ejection fraction in 65-70% range.  (c) echo 01/18/2021 showed an ejection fraction in the 60-65 % range  (d) echo 04/03/2021 showed an ejection fraction in the 55 to 60% range.  (4) status post bilateral mastectomies 11/07/2020 showing  (A) on the right, no evidence of malignancy  (B) on the left, residual ypT1c ypN0 invasive ductal carcinoma, grade 3, with negative margins, now triple negative, with an MIB-1 of 15%.  (5) bilateral salpingo-oophorectomy and hysterectomy with incidental cholecystectomy 03/14/2021 showed benign pathology   (6) olaparib to start December 2022     PLAN:  Sheryl Mcmillan has gone through quite a few surgeries recently and is holding up remarkably well.  She is still has that her definitive implant reconstruction in November ahead of her.  Her most recent echo shows a nonsignificant drop from the echo prior to that but if we go back before then there has been a 10% drop.  Of course the  ejection fraction is still in the normal range.  Nevertheless I am asking cardiology to give [place] a read and let us know if we should pause the Herceptin, or continue as before  I think she will benefit from venlafaxine.  We are starting that now.  She will let us know when she returns in 3 weeks whether it is helping and whether we need to increase the dose to 75 mg instead of where we are starting at, 37.5 mg.  Once she recovers from her definitive implant placement, sometime in December most likely, she will start olaparib.  She will see [place] again in 3 weeks.  She knows to call for any other issue that may develop before then  Total encounter time 25 minutes.Sarajane Jews C. Posey Jasmin, MD 03/09/21 1:24 PM Medical Oncology and Hematology [facility] [place], [place] [address] Tel. [phone]    Fax. [phone]   I, Wilburn Mylar, am acting as scribe for Dr. [name]. Treyvon Blahut.  I, Lurline Del MD, have reviewed the above documentation for accuracy and completeness, and I agree with the above.    *Total Encounter Time as defined by the Centers for Medicare and Medicaid Services includes, in addition to the face-to-face time of a patient visit (documented in the note above) non-face-to-face time: obtaining and reviewing outside history, ordering and reviewing medications, tests or procedures, care coordination (communications with other health care professionals or caregivers) and documentation in the medical record.

## 2021-04-24 ENCOUNTER — Other Ambulatory Visit: Payer: Self-pay | Admitting: Oncology

## 2021-04-24 NOTE — Progress Notes (Unsigned)
Dr Aundra Dubin evaluated Abngie's most recent echo ; see his note below  Larey Dresser, MD  Leah Skora, [name], MD I think that it would be ok to continue Herceptin, I don't think that the EF is significantly different (difficult study so strain was not done).

## 2021-04-25 ENCOUNTER — Ambulatory Visit (HOSPITAL_BASED_OUTPATIENT_CLINIC_OR_DEPARTMENT_OTHER)
Admission: RE | Admit: 2021-04-25 | Discharge: 2021-04-25 | Disposition: A | Discharge status: Home / Self Care | Payer: Medicaid Other | Attending: Plastic Surgery | Admitting: Plastic Surgery

## 2021-04-25 ENCOUNTER — Other Ambulatory Visit: Payer: Self-pay

## 2021-04-25 ENCOUNTER — Encounter (HOSPITAL_BASED_OUTPATIENT_CLINIC_OR_DEPARTMENT_OTHER)
Admission: RE | Disposition: A | Discharge status: Home / Self Care | Payer: Self-pay | Source: Home / Self Care | Attending: Plastic Surgery

## 2021-04-25 ENCOUNTER — Ambulatory Visit (HOSPITAL_BASED_OUTPATIENT_CLINIC_OR_DEPARTMENT_OTHER): Payer: Medicaid Other | Admitting: Certified Registered"

## 2021-04-25 ENCOUNTER — Encounter (HOSPITAL_BASED_OUTPATIENT_CLINIC_OR_DEPARTMENT_OTHER): Payer: Self-pay | Admitting: Plastic Surgery

## 2021-04-25 DIAGNOSIS — Z9013 Acquired absence of bilateral breasts and nipples: Secondary | ICD-10-CM | POA: Insufficient documentation

## 2021-04-25 DIAGNOSIS — Z87891 Personal history of nicotine dependence: Secondary | ICD-10-CM | POA: Diagnosis not present

## 2021-04-25 DIAGNOSIS — K219 Gastro-esophageal reflux disease without esophagitis: Secondary | ICD-10-CM | POA: Diagnosis not present

## 2021-04-25 DIAGNOSIS — Z6841 Body Mass Index (BMI) 40.0 and over, adult: Secondary | ICD-10-CM | POA: Insufficient documentation

## 2021-04-25 DIAGNOSIS — M1990 Unspecified osteoarthritis, unspecified site: Secondary | ICD-10-CM | POA: Insufficient documentation

## 2021-04-25 DIAGNOSIS — Z45812 Encounter for adjustment or removal of left breast implant: Secondary | ICD-10-CM | POA: Insufficient documentation

## 2021-04-25 DIAGNOSIS — C50412 Malignant neoplasm of upper-outer quadrant of left female breast: Secondary | ICD-10-CM | POA: Insufficient documentation

## 2021-04-25 DIAGNOSIS — Z45811 Encounter for adjustment or removal of right breast implant: Secondary | ICD-10-CM | POA: Insufficient documentation

## 2021-04-25 DIAGNOSIS — Z170 Estrogen receptor positive status [ER+]: Secondary | ICD-10-CM | POA: Insufficient documentation

## 2021-04-25 HISTORY — PX: REMOVAL OF BILATERAL TISSUE EXPANDERS WITH PLACEMENT OF BILATERAL BREAST IMPLANTS: SHX6431

## 2021-04-25 HISTORY — DX: Nausea with vomiting, unspecified: R11.2

## 2021-04-25 HISTORY — DX: Other specified postprocedural states: Z98.890

## 2021-04-25 HISTORY — PX: MASS EXCISION: SHX2000

## 2021-04-25 SURGERY — REMOVAL, TISSUE EXPANDER, BREAST, BILATERAL, WITH BILATERAL IMPLANT IMPLANT INSERTION
Anesthesia: General | Site: Breast | Laterality: Bilateral

## 2021-04-25 MED ORDER — CEFAZOLIN IN SODIUM CHLORIDE 3-0.9 GM/100ML-% IV SOLN
INTRAVENOUS | Status: AC
  Filled 2021-04-25: qty 100

## 2021-04-25 MED ORDER — LACTATED RINGERS IV SOLN: INTRAVENOUS | Status: DC

## 2021-04-25 MED ORDER — DEXMEDETOMIDINE (PRECEDEX) IN NS 20 MCG/5ML (4 MCG/ML) IV SYRINGE
PREFILLED_SYRINGE | INTRAVENOUS | Status: DC | PRN
  Administered 2021-04-25: 8 ug via INTRAVENOUS
  Administered 2021-04-25: 12 ug via INTRAVENOUS

## 2021-04-25 MED ORDER — OXYCODONE HCL 5 MG PO TABS
ORAL_TABLET | ORAL | Status: AC
  Filled 2021-04-25: qty 1

## 2021-04-25 MED ORDER — FENTANYL CITRATE (PF) 100 MCG/2ML IJ SOLN
INTRAMUSCULAR | Status: AC
  Filled 2021-04-25: qty 2

## 2021-04-25 MED ORDER — SODIUM CHLORIDE 0.9 % IV SOLN
INTRAVENOUS | Status: AC
  Filled 2021-04-25: qty 10

## 2021-04-25 MED ORDER — PROPOFOL 10 MG/ML IV BOLUS
INTRAVENOUS | Status: DC | PRN
Start: 2021-04-25 — End: 2021-04-25
  Administered 2021-04-25: 200 mg via INTRAVENOUS

## 2021-04-25 MED ORDER — LACTATED RINGERS IV SOLN
INTRAVENOUS | Status: DC | PRN
  Administered 2021-04-25: 1330 mL

## 2021-04-25 MED ORDER — MIDAZOLAM HCL 2 MG/2ML IJ SOLN
INTRAMUSCULAR | Status: AC
  Filled 2021-04-25: qty 2

## 2021-04-25 MED ORDER — HYDROMORPHONE HCL 1 MG/ML IJ SOLN
0.2500 mg | INTRAMUSCULAR | Status: DC | PRN
  Administered 2021-04-25: 0.5 mg via INTRAVENOUS

## 2021-04-25 MED ORDER — HYDROMORPHONE HCL 1 MG/ML IJ SOLN
INTRAMUSCULAR | Status: AC
  Filled 2021-04-25: qty 0.5

## 2021-04-25 MED ORDER — FENTANYL CITRATE (PF) 100 MCG/2ML IJ SOLN
INTRAMUSCULAR | Status: DC | PRN
  Administered 2021-04-25 (×6): 50 ug via INTRAVENOUS
  Administered 2021-04-25: 100 ug via INTRAVENOUS

## 2021-04-25 MED ORDER — PHENYLEPHRINE 40 MCG/ML (10ML) SYRINGE FOR IV PUSH (FOR BLOOD PRESSURE SUPPORT)
PREFILLED_SYRINGE | INTRAVENOUS | Status: AC
  Filled 2021-04-25: qty 10

## 2021-04-25 MED ORDER — ONDANSETRON HCL 4 MG/5ML PO SOLN
4.0000 mg | Freq: Once | ORAL | Status: AC
  Administered 2021-04-25: 4 mg via ORAL
  Filled 2021-04-25: qty 5

## 2021-04-25 MED ORDER — DROPERIDOL 2.5 MG/ML IJ SOLN
INTRAMUSCULAR | Status: DC | PRN
  Administered 2021-04-25: .625 mg via INTRAVENOUS

## 2021-04-25 MED ORDER — OXYCODONE HCL 5 MG PO TABS
5.0000 mg | ORAL_TABLET | Freq: Once | ORAL | Status: AC | PRN
  Administered 2021-04-25: 5 mg via ORAL

## 2021-04-25 MED ORDER — MEPERIDINE HCL 25 MG/ML IJ SOLN: 6.2500 mg | INTRAMUSCULAR | Status: DC | PRN

## 2021-04-25 MED ORDER — PROMETHAZINE HCL 25 MG/ML IJ SOLN: 6.2500 mg | INTRAMUSCULAR | Status: DC | PRN

## 2021-04-25 MED ORDER — CEFAZOLIN SODIUM-DEXTROSE 2-4 GM/100ML-% IV SOLN
INTRAVENOUS | Status: AC
  Filled 2021-04-25: qty 100

## 2021-04-25 MED ORDER — LIDOCAINE 2% (20 MG/ML) 5 ML SYRINGE
INTRAMUSCULAR | Status: DC | PRN
  Administered 2021-04-25: 60 mg via INTRAVENOUS

## 2021-04-25 MED ORDER — DEXAMETHASONE SODIUM PHOSPHATE 10 MG/ML IJ SOLN
INTRAMUSCULAR | Status: DC | PRN
  Administered 2021-04-25: 5 mg via INTRAVENOUS

## 2021-04-25 MED ORDER — PHENYLEPHRINE 40 MCG/ML (10ML) SYRINGE FOR IV PUSH (FOR BLOOD PRESSURE SUPPORT)
PREFILLED_SYRINGE | INTRAVENOUS | Status: AC
  Filled 2021-04-25: qty 20

## 2021-04-25 MED ORDER — EPHEDRINE 5 MG/ML INJ
INTRAVENOUS | Status: AC
  Filled 2021-04-25: qty 5

## 2021-04-25 MED ORDER — CEFAZOLIN IN SODIUM CHLORIDE 3-0.9 GM/100ML-% IV SOLN
3.0000 g | INTRAVENOUS | Status: AC
  Administered 2021-04-25: 3 g via INTRAVENOUS

## 2021-04-25 MED ORDER — OXYCODONE HCL 5 MG/5ML PO SOLN: 5.0000 mg | Freq: Once | ORAL | Status: AC | PRN

## 2021-04-25 MED ORDER — PROPOFOL 500 MG/50ML IV EMUL
INTRAVENOUS | Status: DC | PRN
  Administered 2021-04-25: 25 ug/kg/min via INTRAVENOUS

## 2021-04-25 MED ORDER — PROPOFOL 10 MG/ML IV BOLUS
INTRAVENOUS | Status: AC
  Filled 2021-04-25: qty 20

## 2021-04-25 MED ORDER — MIDAZOLAM HCL 5 MG/5ML IJ SOLN
INTRAMUSCULAR | Status: DC | PRN
  Administered 2021-04-25: 2 mg via INTRAVENOUS

## 2021-04-25 MED ORDER — ONDANSETRON HCL 4 MG/2ML IJ SOLN
INTRAMUSCULAR | Status: AC
  Filled 2021-04-25: qty 2

## 2021-04-25 SURGICAL SUPPLY — 48 items
APL PRP STRL LF DISP 70% ISPRP (MISCELLANEOUS) ×4
APL SKNCLS STERI-STRIP NONHPOA (GAUZE/BANDAGES/DRESSINGS) ×4
BAG DECANTER FOR FLEXI CONT (MISCELLANEOUS) ×3 IMPLANT
BENZOIN TINCTURE PRP APPL 2/3 (GAUZE/BANDAGES/DRESSINGS) ×6 IMPLANT
BLADE SURG 10 STRL SS (BLADE) ×6 IMPLANT
BLADE SURG 15 STRL LF DISP TIS (BLADE) ×2 IMPLANT
BLADE SURG 15 STRL SS (BLADE) ×3
BNDG CMPR MED 10X6 ELC LF (GAUZE/BANDAGES/DRESSINGS) ×2
BNDG ELASTIC 6X10 VLCR STRL LF (GAUZE/BANDAGES/DRESSINGS) ×3 IMPLANT
CANISTER SUCT 1200ML W/VALVE (MISCELLANEOUS) ×3 IMPLANT
CHLORAPREP W/TINT 26 (MISCELLANEOUS) ×6 IMPLANT
COVER BACK TABLE 60X90IN (DRAPES) ×3 IMPLANT
COVER MAYO STAND STRL (DRAPES) ×3 IMPLANT
DRAPE LAPAROSCOPIC ABDOMINAL (DRAPES) ×3 IMPLANT
DRAPE UTILITY XL STRL (DRAPES) ×6 IMPLANT
DRSG PAD ABDOMINAL 8X10 ST (GAUZE/BANDAGES/DRESSINGS) ×12 IMPLANT
ELECT REM PT RETURN 9FT ADLT (ELECTROSURGICAL) ×3
ELECTRODE REM PT RTRN 9FT ADLT (ELECTROSURGICAL) ×2 IMPLANT
FUNNEL KELLER 2 DISP (MISCELLANEOUS) ×3 IMPLANT
GAUZE SPONGE 4X4 12PLY STRL (GAUZE/BANDAGES/DRESSINGS) ×3 IMPLANT
GLOVE SURG ENC TEXT LTX SZ7.5 (GLOVE) ×6 IMPLANT
GOWN STRL REUS W/ TWL LRG LVL3 (GOWN DISPOSABLE) ×6 IMPLANT
GOWN STRL REUS W/TWL LRG LVL3 (GOWN DISPOSABLE) ×9
IMPL BREAST 790CC (Breast) ×4 IMPLANT
IMPLANT BREAST 790CC (Breast) ×6 IMPLANT
NDL SAFETY ECLIPSE 18X1.5 (NEEDLE) ×2 IMPLANT
NEEDLE FILTER BLUNT 18X 1/2SAF (NEEDLE) ×1
NEEDLE FILTER BLUNT 18X1 1/2 (NEEDLE) ×2 IMPLANT
NEEDLE HYPO 18GX1.5 SHARP (NEEDLE) ×3
NEEDLE HYPO 25X1 1.5 SAFETY (NEEDLE) ×3 IMPLANT
PACK BASIN DAY SURGERY FS (CUSTOM PROCEDURE TRAY) ×3 IMPLANT
PENCIL SMOKE EVACUATOR (MISCELLANEOUS) ×3 IMPLANT
SIZER GEL HP 790CC (SIZER) ×6 IMPLANT
SLEEVE SCD COMPRESS KNEE MED (STOCKING) ×3 IMPLANT
SPONGE T-LAP 18X18 ~~LOC~~+RFID (SPONGE) ×3 IMPLANT
STAPLER INSORB 30 2030 C-SECTI (MISCELLANEOUS) ×6 IMPLANT
STAPLER VISISTAT 35W (STAPLE) ×3 IMPLANT
STRIP SUTURE WOUND CLOSURE 1/2 (MISCELLANEOUS) ×12 IMPLANT
SUT PDS 3-0 CT2 (SUTURE) ×6
SUT PDS II 3-0 CT2 27 ABS (SUTURE) ×4 IMPLANT
SUT VLOC 90 P-14 23 (SUTURE) ×9 IMPLANT
SYR 50ML LL SCALE MARK (SYRINGE) ×3 IMPLANT
SYR BULB IRRIG 60ML STRL (SYRINGE) ×3 IMPLANT
TOWEL GREEN STERILE FF (TOWEL DISPOSABLE) ×6 IMPLANT
TUBE CONNECTING 20X1/4 (TUBING) ×3 IMPLANT
TUBING INFILTRATION IT-10001 (TUBING) ×3 IMPLANT
UNDERPAD 30X36 HEAVY ABSORB (UNDERPADS AND DIAPERS) ×6 IMPLANT
YANKAUER SUCT BULB TIP NO VENT (SUCTIONS) ×3 IMPLANT

## 2021-04-25 NOTE — Anesthesia Preprocedure Evaluation (Signed)
Anesthesia Evaluation  Patient identified by MRN, date of birth, ID band Patient awake    Reviewed: Allergy & Precautions, NPO status , Patient's Chart, lab work & pertinent test results  History of Anesthesia Complications (+) PONV  Airway Mallampati: III  TM Distance: >3 FB Neck ROM: Full    Dental  (+) Edentulous Upper, Edentulous Lower   Pulmonary neg pulmonary ROS, former smoker,    Pulmonary exam normal        Cardiovascular negative cardio ROS   Rhythm:Regular Rate:Normal     Neuro/Psych  Headaches, negative psych ROS   GI/Hepatic Neg liver ROS, GERD  Medicated,  Endo/Other  Morbid obesity  Renal/GU negative Renal ROS  negative genitourinary   Musculoskeletal  (+) Arthritis , Osteoarthritis,  BRCA 1   Abdominal (+) + obese,  Abdomen: soft. Bowel sounds: normal.  Peds  Hematology negative hematology ROS (+)   Anesthesia Other Findings   Reproductive/Obstetrics                             Anesthesia Physical  Anesthesia Plan  ASA: 3  Anesthesia Plan: General   Post-op Pain Management:    Induction: Intravenous  PONV Risk Score and Plan: 4 or greater and Ondansetron, Dexamethasone, Midazolam, Treatment may vary due to age or medical condition and Droperidol  Airway Management Planned: LMA  Additional Equipment: None  Intra-op Plan:   Post-operative Plan: Extubation in OR  Informed Consent: I have reviewed the patients History and Physical, chart, labs and discussed the procedure including the risks, benefits and alternatives for the proposed anesthesia with the patient or authorized representative who has indicated his/her understanding and acceptance.     Dental advisory given  Plan Discussed with: CRNA  Anesthesia Plan Comments: (Lab Results      Component                Value               Date                      WBC                      9.0                  03/09/2021                HGB                      12.6                03/09/2021                HCT                      40.1                03/09/2021                MCV                      82.3                03/09/2021                PLT  312                 03/09/2021           Lab Results      Component                Value               Date                      NA                       140                 03/09/2021                K                        4.0                 03/09/2021                CO2                      24                  03/09/2021                GLUCOSE                  155 (H)             03/09/2021                BUN                      8                   03/09/2021                CREATININE               0.82                03/09/2021                CALCIUM                  9.1                 03/09/2021                GFRNONAA                 >60                 03/09/2021                GFRAA                    >60                 06/06/2019           ECHO 07/22: 1. Left ventricular ejection fraction, by estimation, is 60 to 65%. The  left ventricle has normal function. The left ventricle has no regional  wall motion abnormalities. There is mild concentric left ventricular  hypertrophy. Left ventricular diastolic  parameters were normal.  2. Right ventricular systolic function is normal. The right ventricular  size is normal.  3. The mitral valve is grossly normal. No evidence of mitral valve  regurgitation. No evidence of mitral stenosis.  4. The aortic valve was not well visualized. Aortic valve regurgitation  is not visualized. No aortic stenosis is present. )        Anesthesia Quick Evaluation

## 2021-04-25 NOTE — Anesthesia Postprocedure Evaluation (Signed)
Anesthesia Post Note  Patient: Sheryl Mcmillan  Procedure(s) Performed: REMOVAL OF BILATERAL TISSUE EXPANDERS WITH PLACEMENT OF BILATERAL BREAST IMPLANTS (Bilateral: Breast) excision of bilateral axillary breast tissue (Bilateral: Axilla)     Patient location during evaluation: PACU Anesthesia Type: General Level of consciousness: awake and alert Pain management: pain level controlled Vital Signs Assessment: post-procedure vital signs reviewed and stable Respiratory status: spontaneous breathing, nonlabored ventilation and respiratory function stable Cardiovascular status: blood pressure returned to baseline and stable Postop Assessment: no apparent nausea or vomiting Anesthetic complications: no   No notable events documented.  Last Vitals:  Vitals:   04/25/21 1515 04/25/21 1530  BP: 131/84 138/83  Pulse: 89 91  Resp: 16 18  Temp:    SpO2: 93% 95%    Last Pain:  Vitals:   04/25/21 1041  TempSrc: Oral  PainSc: 0-No pain                 Lynda Rainwater

## 2021-04-25 NOTE — Discharge Instructions (Addendum)
Activity As tolerated: NO showers for 3 days. Keep ACE wrap on breasts until then. After showering, put ACE wrap back on, this is important for compression and to decrease swelling/pain. NO driving while in pain, taking pain medication or if you are unable to safely react to traffic. No heavy activities Take Pain medication (Norco) as needed for severe pain. Otherwise, you can use ibuprofen or tylenol as needed. Avoid more than 3,000 mg of tylenol in 24 hours. Norco has 325mg  of tylenol per dose.  Diet: Regular. Drink plenty of fluids and eat healthy, high protein, low carbs.  Wound Care: Keep dressing clean & dry. You may change bandages after showering if you continue to notice some drainage. You can reuse bandages if they are not dirty/soiled.  Special Instructions: Call Doctor if any unusual problems occur such as pain, excessive Bleeding, unrelieved Nausea/vomiting, Fever &/or chills  Follow-up appointment: Scheduled for next week.   Post Anesthesia Home Care Instructions  Activity: Get plenty of rest for the remainder of the day. A responsible individual must stay with you for 24 hours following the procedure.  For the next 24 hours, DO NOT: -Drive a car -Paediatric nurse -Drink alcoholic beverages -Take any medication unless instructed by your physician -Make any legal decisions or sign important papers.  Meals: Start with liquid foods such as gelatin or soup. Progress to regular foods as tolerated. Avoid greasy, spicy, heavy foods. If nausea and/or vomiting occur, drink only clear liquids until the nausea and/or vomiting subsides. Call your physician if vomiting continues.  Special Instructions/Symptoms: Your throat may feel dry or sore from the anesthesia or the breathing tube placed in your throat during surgery. If this causes discomfort, gargle with warm salt water. The discomfort should disappear within 24 hours.  If you had a scopolamine patch placed behind your ear for  the management of post- operative nausea and/or vomiting:  1. The medication in the patch is effective for 72 hours, after which it should be removed.  Wrap patch in a tissue and discard in the trash. Wash hands thoroughly with soap and water. 2. You may remove the patch earlier than 72 hours if you experience unpleasant side effects which may include dry mouth, dizziness or visual disturbances. 3. Avoid touching the patch. Wash your hands with soap and water after contact with the patch.

## 2021-04-25 NOTE — Anesthesia Procedure Notes (Signed)
Procedure Name: LMA Insertion Date/Time: 04/25/2021 1:06 PM Performed by: Maryella Shivers, CRNA Pre-anesthesia Checklist: Patient identified, Emergency Drugs available, Suction available and Patient being monitored Patient Re-evaluated:Patient Re-evaluated prior to induction Oxygen Delivery Method: Circle system utilized Preoxygenation: Pre-oxygenation with 100% oxygen Induction Type: IV induction Ventilation: Mask ventilation without difficulty LMA: LMA inserted LMA Size: 4.0 Number of attempts: 1 Airway Equipment and Method: Bite block Placement Confirmation: positive ETCO2 Tube secured with: Tape Dental Injury: Teeth and Oropharynx as per pre-operative assessment

## 2021-04-25 NOTE — Interval H&P Note (Signed)
Patient seen and examined. Risks and benefits discussed. Proceed with surgery.

## 2021-04-25 NOTE — Op Note (Signed)
Operative Note   DATE OF OPERATION: 04/25/2021  SURGICAL DEPARTMENT: Plastic Surgery  PREOPERATIVE DIAGNOSES: Bilateral mastectomy defects  POSTOPERATIVE DIAGNOSES:  same  PROCEDURE: 1.  Exchange of bilateral breast tissue expanders for gel implants 2.  Excision of bilateral excess axillary soft tissue 3.  Complex closure bilateral axilla totaling 20 cm on the left and 20 cm on the right  SURGEON: Talmadge Coventry, MD  ASSISTANT: Verdie Shire, PA The advanced practice practitioner (APP) assisted throughout the case.  The APP was essential in retraction and counter traction when needed to make the case progress smoothly.  This retraction and assistance made it possible to see the tissue planes for the procedure.  The assistance was needed for hemostasis, tissue re-approximation and closure of the incision site.   ANESTHESIA:  General.   COMPLICATIONS: None.   INDICATIONS FOR PROCEDURE:  The patient, Indie Boehne is a 45 y.o. 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 MRN: [id number]  CONSENT:  Informed consent was obtained directly from the patient. Risks, benefits and alternatives were fully discussed. Specific risks including but not limited to bleeding, infection, hematoma, seroma, scarring, pain, contracture, asymmetry, wound healing problems, and need for further surgery were all discussed. The patient did have an ample opportunity to have questions answered to satisfaction.   DESCRIPTION OF PROCEDURE:  The patient was taken to the operating room. SCDs were placed and antibiotics were given.  General anesthesia was administered.  The patient's operative site was prepped and draped in a sterile fashion. A time out was performed and all information was confirmed to be correct.  I had marked her in the standing position for excess axillary soft tissue excision on both sides.  These areas were infiltrated with a small  amount of tumescent solution.  This was given time to work.  This was excised on both sides with a 10 blade and cautery.  Length of the excision on both sides was 20 cm.  Circumferential undermining was performed.  Meticulous hemostasis was obtained.  Those areas were then closed in layers with interrupted buried Enzor stapler and a running 3 OV lock.  Incisions were then made along the inframammary scar on both sides.  Dissected down to the implant pockets with cautery.  The capsules were opened.  Tissue expanders were punctured and saline was removed.  Expanders were then removed without any issue.  790 cc sizers were then placed and the skin was stapled closed.  The patient was then set up to check for shape size and symmetry.  This showed a natural shape and good symmetry.  There was a pre-existing small dogear medially on the right side.  This was excised with a 10 blade hemostasis was obtained and closed with interrupted buried 3-0 PDS sutures and a V-Loc suture.  Both sizers were then removed.  Both pockets were then irrigated with triple antibiotic solution.  I changed my gloves.  The implants were Mentor memory gel extra smooth high profile implants with 790 cc of volume.  Serial number on the left side is [id number].  Serial number on the right side is [id number].  These were inserted on each side with a Keller funnel.  The pocket with the incorporated acellular dermal matrix was then closed with 3-0 PDS sutures.  Skin was closed with buried Enzor staples and a running 3 OV lock.  This gave a nice on table result.  Benzoin and Steri-Strips were then applied followed  by soft compressive wrap.  The patient tolerated the procedure well.  There were no complications. The patient was allowed to wake from anesthesia, extubated and taken to the recovery room in satisfactory condition.

## 2021-04-25 NOTE — Transfer of Care (Signed)
Immediate Anesthesia Transfer of Care Note  Patient: JANICE BODINE  Procedure(s) Performed: REMOVAL OF BILATERAL TISSUE EXPANDERS WITH PLACEMENT OF BILATERAL BREAST IMPLANTS (Bilateral: Breast) excision of bilateral axillary breast tissue (Bilateral: Axilla)  Patient Location: PACU  Anesthesia Type:General  Level of Consciousness: sedated  Airway & Oxygen Therapy: Patient Spontanous Breathing and Patient connected to face mask oxygen  Post-op Assessment: Report given to RN and Post -op Vital signs reviewed and stable  Post vital signs: Reviewed and stable  Last Vitals:  Vitals Value Taken Time  BP 154/104 04/25/21 1446  Temp    Pulse 90 04/25/21 1447  Resp 17 04/25/21 1447  SpO2 100 % 04/25/21 1447  Vitals shown include unvalidated device data.  Last Pain:  Vitals:   04/25/21 1041  TempSrc: Oral  PainSc: 0-No pain         Complications: No notable events documented.

## 2021-04-26 ENCOUNTER — Encounter (HOSPITAL_BASED_OUTPATIENT_CLINIC_OR_DEPARTMENT_OTHER): Payer: Self-pay | Admitting: Plastic Surgery

## 2021-04-26 ENCOUNTER — Telehealth: Payer: Self-pay

## 2021-04-26 NOTE — Telephone Encounter (Signed)
Patient called to say that she had surgery yesterday with Dr. [name] and she now has a temperature of 100.9.  She would like to know what she should do.  Please call.

## 2021-04-26 NOTE — Telephone Encounter (Signed)
Pt reported that she developed a fever in the evening of the day of her SX. She also reported experiencing n/v in the morning of her SX and after but none this morning. She has nasal congestion but reports no coughing, body aches or chills. She stated that she hasn't been in much pain from the [place]. She took a Norco tablet about 20 mins before I called her. She asked if she could take Dayquil.   Per [place], [place], pt can take Dayquil with the Norco but do not take any extra acetaminophen after that. She can take ibuprofen if she needs to take anything extra x pain. Pt is not to exceed 3000 mg of acetaminophen in 24 hours. If pt develops any other cold/flu like SX should reach out to her PCP. Pt is aware and conveyed understanding.

## 2021-04-27 ENCOUNTER — Encounter: Payer: Self-pay | Admitting: *Deleted

## 2021-04-27 LAB — SURGICAL PATHOLOGY

## 2021-04-28 ENCOUNTER — Telehealth: Payer: Self-pay

## 2021-04-28 NOTE — Telephone Encounter (Signed)
Patient's daughter called to say that the patient is ready to take her shower and needs to know what to do about the bandages and tape.  I told the patient, per our clinical staff, to remove the ace wrap or binder before showering, but keep everything else on.  Also, per our clinical staff, do not let the water from the shower hit her breasts.  She will need to turn her back to the shower and let the water run down.  I asked her to please call us if she has any other questions.

## 2021-05-01 ENCOUNTER — Telehealth: Payer: Self-pay | Admitting: Plastic Surgery

## 2021-05-01 NOTE — Telephone Encounter (Signed)
Pt inquired if she had to continuously wear a sports bra or binder. Adv pt that she could wear either or but needed to keep it on around the clock. Pt c/o the sports bra hitting right at her incisions. I adv pt to wear a maxi pad where she's having discomfort at the surgical site and to call us back if she doesn't have any improvement. Pt conveyed understanding and is aware that she has an upcoming appt with Dr. [name] on 11/9.

## 2021-05-01 NOTE — Telephone Encounter (Signed)
Patient called and would like a few post op care questions answered. Would specifically like to know if she needs to stay wrapped.  Please follow up with patient.  Thank you

## 2021-05-03 ENCOUNTER — Ambulatory Visit (INDEPENDENT_AMBULATORY_CARE_PROVIDER_SITE_OTHER): Payer: Medicaid Other | Admitting: Plastic Surgery

## 2021-05-03 ENCOUNTER — Other Ambulatory Visit: Payer: Self-pay

## 2021-05-03 DIAGNOSIS — Z170 Estrogen receptor positive status [ER+]: Secondary | ICD-10-CM

## 2021-05-03 DIAGNOSIS — C50412 Malignant neoplasm of upper-outer quadrant of left female breast: Secondary | ICD-10-CM

## 2021-05-03 NOTE — Progress Notes (Signed)
Patient is here postop from removal of tissue expanders and replacement with gel implants.  She feels like things are going well.  I also excised some excess skin laterally on both sides.  On exam everything looks to be healing fine.  No signs of redness or fluid collections.  We will have her continue to wear supportive garments and avoid strenuous activity and see her again in a couple weeks.  All of her questions were answered.

## 2021-05-10 NOTE — Progress Notes (Signed)
[place]  Telephone:([phone]) [phone] Fax:(336) [phone]    ID: Sheryl Mcmillan DOB: [birth year]  MR#: [id number]  GUY#:[id number]  Patient Care Team: Windell Hummingbird, PA-C as PCP - General (Physician Assistant) [name], RN as Oncology Nurse Navigator [name], RN as Oncology Nurse Navigator [name], MD as Consulting Physician (General Surgery) Magrinat, [name], MD as Consulting Physician (Oncology) [name], MD as Consulting Physician (Radiation Oncology) [name], MD as Consulting Physician (Plastic Surgery) Scot Dock, NP OTHER MD:  CHIEF COMPLAINT: estrogen receptor negative, 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  Her port was removed and she is receiving this subcutaneously as Hylecta.  She tolerates this well and has no symptoms suggestive of heart failure..    Her most recent echo was completed on 04/03/2021 and showed an ejection fraction of 55-60%, which is overall stable from prior.  However her baseline echo dose had been in the 65-70% range.  In between of course she had bilateral mastectomies with expanders in place and that may change the echo technique and possibly the sensitivity.  Sheryl Mcmillan is doing well today.  She is having issues with her left great toe.  She had an ingrown toenail removed by her podiatrist.  She had a reaction to the bandaging, and has changed everything.  The nail is slowly improving.  She notes that some of the medication for her toe got in her left eye and caused some swelling.  She says that it is much improved.  She has pictures on her phone of her nails and her toe is improving.      REVIEW OF SYSTEMS: Review of Systems  Constitutional:  Negative for appetite change, chills, fatigue, fever and unexpected weight change.  HENT:   Negative for hearing loss, lump/mass and trouble swallowing.   Eyes:  Negative for eye problems and icterus.  Respiratory:  Negative for chest tightness, cough and shortness of breath.   Cardiovascular:  Negative for chest pain, leg swelling and palpitations.  Gastrointestinal:  Negative for abdominal distention, abdominal pain, constipation, diarrhea, nausea and vomiting.  Endocrine: Negative for hot flashes.  Genitourinary:  Negative for difficulty urinating.   Musculoskeletal:  Negative for arthralgias.  Skin:  Negative for itching and rash.  Neurological:  Negative for dizziness, extremity weakness, headaches and numbness.  Hematological:  Negative for adenopathy. Does not bruise/bleed easily.  Psychiatric/Behavioral:  Negative for depression. The patient is not nervous/anxious.       COVID 19 VACCINATION STATUS: Status post vaccine x2, no booster as of February 2022   HISTORY OF CURRENT ILLNESS: From the original intake note:   Sheryl Mcmillan presented with a palpable lump in the outer left breast, which she initially felt approximately 6 months prior. She waited to seek medical attention because she had no insurance.  Eventually she enrolled in the [facility] program and underwent bilateral diagnostic mammography with tomography and left breast ultrasonography at [facility] on 05/10/2020 showing: breast density category B; vaguely palpable 2.5 cm mass in left breast at 1:30; no pathologic left axillary lymphadenopathy or evidence of right breast malignancy.  Accordingly on 05/18/2020 she proceeded to biopsy of the left breast area in question. The  pathology from this procedure ([id number]) showed: invasive ductal carcinoma, grade 3. Prognostic indicators significant for: estrogen receptor, 40% positive with weak staining intensity  and progesterone receptor, 0% negative. Proliferation marker Ki67 at 50%. HER2 positive by immunohistochemistry (3+).  The patient's subsequent history is as detailed below.   PAST MEDICAL HISTORY: Past Medical History:  Diagnosis Date   Arthritis    Breast cancer ([place])    Family history of non-Hodgkin's lymphoma 06/01/2020   GERD (gastroesophageal reflux disease)    Hemorrhoids    Migraines    PCOS (polycystic ovarian syndrome)    PONV (postoperative nausea and vomiting)     PAST SURGICAL HISTORY: Past Surgical History:  Procedure Laterality Date   AXILLARY SENTINEL NODE BIOPSY Left 11/07/2020   Procedure: LEFT AXILLARY SENTINEL NODE BIOPSY;  Surgeon: [name], MD;  Location: [place];  Service: General;  Laterality: Left;   BREAST RECONSTRUCTION WITH PLACEMENT OF TISSUE EXPANDER AND FLEX HD (ACELLULAR HYDRATED DERMIS) Bilateral 11/07/2020   Procedure: BILATERAL BREAST RECONSTRUCTION WITH PLACEMENT OF TISSUE EXPANDER AND FLEX HD (ACELLULAR HYDRATED DERMIS);  Surgeon: [name], MD;  Location: [facility];  Service: Plastics;  Laterality: Bilateral;   COSMETIC SURGERY     DILATION AND CURETTAGE OF UTERUS     HEMORRHOID SURGERY     IR IMAGING GUIDED PORT INSERTION  06/15/2020   MASS EXCISION Bilateral 04/25/2021   Procedure: excision of bilateral axillary breast tissue;  Surgeon: [name], MD;  Location: [facility];  Service: Plastics;  Laterality: Bilateral;   PORT-A-CATH REMOVAL Right 11/07/2020   Procedure: REMOVAL PORT-A-CATH;  Surgeon: [name], MD;  Location: [place];  Service: General;  Laterality: Right;   REMOVAL OF BILATERAL TISSUE EXPANDERS WITH PLACEMENT OF BILATERAL BREAST IMPLANTS Bilateral 04/25/2021   Procedure: REMOVAL OF BILATERAL TISSUE EXPANDERS WITH PLACEMENT OF BILATERAL BREAST IMPLANTS;  Surgeon: [name], MD;  Location: [place];  Service: Plastics;  Laterality: Bilateral;   ROBOTIC ASSISTED TOTAL  HYSTERECTOMY WITH BILATERAL SALPINGO OOPHERECTOMY Bilateral 03/14/2021   Procedure: XI ROBOTIC ASSISTED TOTAL HYSTERECTOMY WITH BILATERAL SALPINGO OOPHORECTOMY;  Surgeon: Everitt Amber, MD;  Location: [facility];  Service: Gynecology;  Laterality: Bilateral;   TOTAL MASTECTOMY Bilateral 11/07/2020   Procedure: BILATERAL MASTECTOMY;  Surgeon: [name], MD;  Location: [place];  Service: General;  Laterality: Bilateral;    FAMILY HISTORY: Family History  Problem Relation Age of Onset   Hypertension Mother    Diabetes Mother    Cancer Mother 17       unknown primary   Other Mother        brain tumor; dx 91s   Non-Hodgkin's lymphoma Brother 75   Other Maternal Uncle 27       brain tumor   Breast cancer Other        MGM's niece; dx late 65s   Pancreatic cancer Neg Hx    Colon cancer Neg Hx    Endometrial cancer Neg Hx    Prostate cancer Neg Hx    Ovarian cancer Neg Hx    She has no information on her father. Her mother died at age 39 from stage IV cancer. The primary cancer was unknown, but it was not breast.  Sheryl Mcmillan has two half brothers (through her mother), one of which was diagnosed with non-Hodgkin's lymphoma. There is no family history of breast, ovarian, or prostate cancer to her knowledge. // Patient is BRCA1 positive. Daughter tested negative.   GYNECOLOGIC HISTORY:  Patient's  last menstrual period was 09/29/2020 (approximate). Menarche: 45 years old Age at first live birth: 45 years old [place] P 1 LMP 05/02/2020 Contraceptive: never used HRT n/a  Hysterectomy? no BSO? 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 She is single. Her significant other [name] is a Freight forwarder. She lives at home with Sheryl Mcmillan, her daughter Sheryl Mcmillan,  [name]'s daughter Sheryl Mcmillan and her 78-monthold daughter Sheryl Mcmillan Daughter [name] age 44 is a bChief Operating Officerat [employer] [place] [employer]    ADVANCED DIRECTIVES: Completed and notarized 09/07/2020.  She has named her aunt TMarveen Reeksas healthcare power of attorney   HEALTH MAINTENANCE: Social History   Tobacco Use   Smoking status: Former    Packs/day: 0.50    Years: 10.00    Pack years: 5.00    Types: Cigarettes    Quit date: 08/23/2020    Years since quitting: 0.7   Smokeless tobacco: Never  Vaping Use   Vaping Use: Never used  Substance Use Topics   Alcohol use: Yes    Comment: rare   Drug use: Yes    Types: Marijuana    Comment: 5-7 days/week     Colonoscopy: 2018  PAP: 2018  Bone density: n/a (age)   Allergies  Allergen Reactions   Carboplatin Hives and Itching    Carboplatin reaction after dose #5 requiring Benadryl, Solumedrol, epinephrine, Claritin.   Other Hives    Baking Soda   Wound Dressing Adhesive Hives    Rips skin, prefers paper tape    Current Outpatient Medications  Medication Sig Dispense Refill   acetaminophen (TYLENOL) 650 MG CR tablet Take 650-1,300 mg by mouth every 8 (eight) hours as needed for pain.     aspirin-acetaminophen-caffeine (EXCEDRIN MIGRAINE) 250-250-65 MG tablet Take 2 tablets by mouth every 6 (six) hours as needed for headache.     BINAXNOW COVID-19 AG HOME TEST KIT TEST AS DIRECTED TODAY     COLLAGEN PO Take 1 tablet by mouth daily.     Multiple Vitamins-Minerals (ONE-A-DAY WOMENS PO) Take 1 tablet by mouth daily.     NON FORMULARY Apply 1 application topically 2 (two) times daily. Skinuva scar cream     omeprazole (PRILOSEC) 40 MG capsule Take 40 mg by mouth daily as needed (acid reflux).     ondansetron (ZOFRAN) 4 MG tablet Take 1 tablet (4 mg total) by mouth every 8 (eight) hours as needed for nausea or vomiting. 20 tablet 0   oxycodone (OXY-IR) 5 MG capsule Take 5 mg by mouth every 4 (four) hours as needed for pain.     senna-docusate (SENOKOT-S) 8.6-50 MG tablet Take 2 tablets by mouth at bedtime. For AFTER surgery only, do not take if having  diarrhea 30 tablet 0   venlafaxine XR (EFFEXOR-XR) 37.5 MG 24 hr capsule Take 1 capsule (37.5 mg total) by mouth daily with breakfast. 90 capsule 4   No current facility-administered medications for this visit.    OBJECTIVE: White woman who appears stated age V48   05/11/21 0913  BP: 131/89  Pulse: (!) 106  Resp: 18  Temp: 97.9 F (36.6 C)  SpO2: 99%     Body mass index is 43.93 kg/m. Filed Weights   05/11/21 0913  Weight: (!) 315 lb (142.9 kg)    GENERAL: Patient is a well appearing female in no acute distress HEENT:  Sclerae anicteric.  Oropharynx  clear and moist. No ulcerations or evidence of oropharyngeal candidiasis. Neck is supple.  NODES:  No cervical, supraclavicular, or axillary lymphadenopathy palpated.  BREAST EXAM:  s/p bilateral mastectomies and reconstruction, no sign of local recurrence.   LUNGS:  Clear to auscultation bilaterally.  No wheezes or rhonchi. HEART:  Regular rate and rhythm. No murmur appreciated. ABDOMEN:  Soft, nontender.  Positive, normoactive bowel sounds. No organomegaly palpated. MSK:  No focal spinal tenderness to palpation. Full range of motion bilaterally in the upper extremities. EXTREMITIES:  No peripheral edema.   SKIN:  Clear with no obvious rashes or skin changes. No nail dyscrasia. NEURO:  Nonfocal. Well oriented.  Appropriate affect.    LAB RESULTS:  CMP     Component Value Date/Time   NA 142 05/11/2021 0858   K 4.0 05/11/2021 0858   CL 108 05/11/2021 0858   CO2 22 05/11/2021 0858   GLUCOSE 131 (H) 05/11/2021 0858   BUN 11 05/11/2021 0858   CREATININE 0.80 05/11/2021 0858   CALCIUM 9.0 05/11/2021 0858   PROT 7.2 05/11/2021 0858   ALBUMIN 3.7 05/11/2021 0858   AST 24 05/11/2021 0858   ALT 37 05/11/2021 0858   ALKPHOS 105 05/11/2021 0858   BILITOT 0.2 (L) 05/11/2021 0858   GFRNONAA >60 05/11/2021 0858   GFRAA >60 06/06/2019 1205    No results found for: TOTALPROTELP, ALBUMINELP, A1GS, A2GS, BETS, BETA2SER,  GAMS, MSPIKE, SPEI  Lab Results  Component Value Date   WBC 9.7 05/11/2021   NEUTROABS 5.9 05/11/2021   HGB 12.8 05/11/2021   HCT 38.6 05/11/2021   MCV 81.4 05/11/2021   PLT 342 05/11/2021    No results found for: LABCA2  No components found for: OFBPZW258  No results for input(s): INR in the last 168 hours.  No results found for: LABCA2  No results found for: CAN199  Lab Results  Component Value Date   CAN125 7.4 12/15/2020    No results found for: NID782  No results found for: CA2729  No components found for: HGQUANT  No results found for: CEA1 / No results found for: CEA1   No results found for: AFPTUMOR  No results found for: CHROMOGRNA  No results found for: KPAFRELGTCHN, LAMBDASER, KAPLAMBRATIO (kappa/lambda light chains)  No results found for: HGBA, HGBA2QUANT, HGBFQUANT, HGBSQUAN (Hemoglobinopathy evaluation)   No results found for: LDH  No results found for: IRON, TIBC, IRONPCTSAT (Iron and TIBC)  No results found for: FERRITIN  Urinalysis    Component Value Date/Time   COLORURINE YELLOW 06/06/2019 [place] 06/06/2019 1205   LABSPEC >1.030 (H) 06/06/2019 1205   PHURINE 5.5 06/06/2019 [place] 06/06/2019 1205   HGBUR TRACE (A) 06/06/2019 [place] 06/06/2019 [place] 06/06/2019 [place] 06/06/2019 1205   UROBILINOGEN 1.0 05/09/2011 1805   NITRITE NEGATIVE 06/06/2019 [place] 06/06/2019 1205    STUDIES: No results found.   ELIGIBLE FOR AVAILABLE RESEARCH PROTOCOL: AET  ASSESSMENT: 45 y.o. BRCA positive [place] woman status post left breast upper outer quadrant biopsy 05/18/2020 for a clinical T2N0, stage IIA invasive ductal carcinoma, grade 3, estrogen receptor weakly positive, progesterone receptor negative, with an MIB-1 of 50%, and HER-2 positive by immunohistochemistry  (1) genetics testing 06/16/2020 shows a pathogenic  mutation in BRCA1 at c.2475del (p.Asp825Glufs*21)  (a)  Variant of uncertain significance in MSH3 at c.2724A>G (Silent).  (b) no additional deleterious mutations through the  Multi-Cancer Panel offered by [facility] including AIP, ALK, APC, ATM, AXIN2,BAP1,  BARD1, BLM, BMPR1A, BRCA1, BRCA2, BRIP1, CASR, CDC73, CDH1, CDK4, CDKN1B, CDKN1C, CDKN2A (p14ARF), CDKN2A (p16INK4a), CEBPA, CHEK2, CTNNA1, DICER1, DIS3L2, EGFR (c.2369C>T, p.Thr790Met variant only), EPCAM (Deletion/duplication testing only), FH, FLCN, GATA2, GPC3, GREM1 (Promoter region deletion/duplication testing only), HOXB13 (c.251G>A, p.Gly84Glu), HRAS, KIT, MAX, MEN1, MET, MITF (c.952G>A, p.Glu318Lys variant only), MLH1, MSH2, MSH3, MSH6, MUTYH, NBN, NF1, NF2, NTHL1, PALB2, PDGFRA, PHOX2B, PMS2, POLD1, POLE, POT1, PRKAR1A, PTCH1, PTEN, RAD50, RAD51C, RAD51D, RB1, RECQL4, RET, RNF43, RUNX1, SDHAF2, SDHA (sequence changes only), SDHB, SDHC, SDHD, SMAD4, SMARCA4, SMARCB1, SMARCE1, STK11, SUFU, TERC, TERT, TMEM127, TP53, TSC1, TSC2, VHL, WRN and WT1.   (2) neoadjuvant chemotherapy to consist of carboplatin and docetaxel together with trastuzumab and Pertuzumab every 3 weeks x 6, starting 06/16/2020, completed 09/28/2020   (a) pertuzumab omitted after cycle 1  (3) trastuzumab to be continued to complete 1 year (through December 2022)  (a) echo 06/09/2020 shows EF in the 60-65% range  (b) echo 09/14/2020 shows an ejection fraction in 65-70% range.  (c) echo 01/18/2021 showed an ejection fraction in the 60-65 % range  (d) echo 04/03/2021 showed an ejection fraction in the 55 to 60% range.  (4) status post bilateral mastectomies 11/07/2020 showing  (A) on the right, no evidence of malignancy  (B) on the left, residual ypT1c ypN0 invasive ductal carcinoma, grade 3, with negative margins, now triple negative, with an MIB-1 of 15%.  (5) bilateral salpingo-oophorectomy and hysterectomy with incidental cholecystectomy 03/14/2021 showed benign  pathology   (6) olaparib to start December 2022     PLAN:  Creedence is here today for follow-up of her breast cancer.  She is going to proceed with her next dose of Herceptin.  She is tolerating this well.  I reviewed her plan with Dr. Jana Hakim.  He will see her in 3 weeks and discuss her transition to a different oncologist as well as when to start her olaparib in January, 2023.    Her other medical problems with her eye and her toe are improving.  I encouraged her on healthy diet and physical activity which she continues to work on.  We will see Hailei back in 3 weeks for labs follow-up and her next treatment.  Total encounter time 20 minutes.*In face-to-face visit time, chart review, lab review, care coordination, and documentation of the encounter.  Wilber Bihari, NP 05/11/21 9:36 AM Medical Oncology and Hematology [facility] [place], [place] [address] Tel. [phone]    Fax. [phone]    *Total Encounter Time as defined by the Centers for Medicare and Medicaid Services includes, in addition to the face-to-face time of a patient visit (documented in the note above) non-face-to-face time: obtaining and reviewing outside history, ordering and reviewing medications, tests or procedures, care coordination (communications with other health care professionals or caregivers) and documentation in the medical record.

## 2021-05-11 ENCOUNTER — Encounter: Payer: Self-pay | Admitting: *Deleted

## 2021-05-11 ENCOUNTER — Inpatient Hospital Stay: Payer: Medicaid Other

## 2021-05-11 ENCOUNTER — Other Ambulatory Visit: Payer: Self-pay

## 2021-05-11 ENCOUNTER — Inpatient Hospital Stay: Payer: Medicaid Other | Attending: Oncology

## 2021-05-11 ENCOUNTER — Inpatient Hospital Stay (HOSPITAL_BASED_OUTPATIENT_CLINIC_OR_DEPARTMENT_OTHER): Payer: Medicaid Other | Admitting: Adult Health

## 2021-05-11 ENCOUNTER — Encounter: Payer: Self-pay | Admitting: Adult Health

## 2021-05-11 VITALS — BP 131/89 | HR 106 | Temp 97.9°F | Resp 18 | Ht 71.0 in | Wt 315.0 lb

## 2021-05-11 VITALS — HR 86

## 2021-05-11 DIAGNOSIS — Z90710 Acquired absence of both cervix and uterus: Secondary | ICD-10-CM | POA: Insufficient documentation

## 2021-05-11 DIAGNOSIS — Z5112 Encounter for antineoplastic immunotherapy: Secondary | ICD-10-CM | POA: Diagnosis not present

## 2021-05-11 DIAGNOSIS — Z170 Estrogen receptor positive status [ER+]: Secondary | ICD-10-CM

## 2021-05-11 DIAGNOSIS — Z90722 Acquired absence of ovaries, bilateral: Secondary | ICD-10-CM | POA: Insufficient documentation

## 2021-05-11 DIAGNOSIS — N939 Abnormal uterine and vaginal bleeding, unspecified: Secondary | ICD-10-CM | POA: Diagnosis not present

## 2021-05-11 DIAGNOSIS — Z1509 Genetic susceptibility to other malignant neoplasm: Secondary | ICD-10-CM

## 2021-05-11 DIAGNOSIS — C50412 Malignant neoplasm of upper-outer quadrant of left female breast: Secondary | ICD-10-CM | POA: Diagnosis not present

## 2021-05-11 LAB — CBC WITH DIFFERENTIAL (CANCER CENTER ONLY)
Abs Immature Granulocytes: 0.04 10*3/uL (ref 0.00–0.07)
Basophils Absolute: 0.1 10*3/uL (ref 0.0–0.1)
Basophils Relative: 1 %
Eosinophils Absolute: 0.2 10*3/uL (ref 0.0–0.5)
Eosinophils Relative: 2 %
HCT: 38.6 % (ref 36.0–46.0)
Hemoglobin: 12.8 g/dL (ref 12.0–15.0)
Immature Granulocytes: 0 %
Lymphocytes Relative: 30 %
Lymphs Abs: 2.9 10*3/uL (ref 0.7–4.0)
MCH: 27 pg (ref 26.0–34.0)
MCHC: 33.2 g/dL (ref 30.0–36.0)
MCV: 81.4 fL (ref 80.0–100.0)
Monocytes Absolute: 0.5 10*3/uL (ref 0.1–1.0)
Monocytes Relative: 5 %
Neutro Abs: 5.9 10*3/uL (ref 1.7–7.7)
Neutrophils Relative %: 62 %
Platelet Count: 342 10*3/uL (ref 150–400)
RBC: 4.74 MIL/uL (ref 3.87–5.11)
RDW: 16.1 % — ABNORMAL HIGH (ref 11.5–15.5)
WBC Count: 9.7 10*3/uL (ref 4.0–10.5)
nRBC: 0 % (ref 0.0–0.2)

## 2021-05-11 LAB — CMP (CANCER CENTER ONLY)
ALT: 37 U/L (ref 0–44)
AST: 24 U/L (ref 15–41)
Albumin: 3.7 g/dL (ref 3.5–5.0)
Alkaline Phosphatase: 105 U/L (ref 38–126)
Anion gap: 12 (ref 5–15)
BUN: 11 mg/dL (ref 6–20)
CO2: 22 mmol/L (ref 22–32)
Calcium: 9 mg/dL (ref 8.9–10.3)
Chloride: 108 mmol/L (ref 98–111)
Creatinine: 0.8 mg/dL (ref 0.44–1.00)
GFR, Estimated: >60 mL/min (ref >60)
Glucose, Bld: 131 mg/dL — ABNORMAL HIGH (ref 70–99)
Potassium: 4 mmol/L (ref 3.5–5.1)
Sodium: 142 mmol/L (ref 135–145)
Total Bilirubin: 0.2 mg/dL — ABNORMAL LOW (ref 0.3–1.2)
Total Protein: 7.2 g/dL (ref 6.5–8.1)

## 2021-05-11 MED ORDER — TRASTUZUMAB-HYALURONIDASE-OYSK 600-10000 MG-UNT/5ML ~~LOC~~ SOLN
600.0000 mg | Freq: Once | SUBCUTANEOUS | Status: AC
  Administered 2021-05-11: 11:00:00 600 mg via SUBCUTANEOUS
  Filled 2021-05-11: qty 5

## 2021-05-17 ENCOUNTER — Ambulatory Visit (INDEPENDENT_AMBULATORY_CARE_PROVIDER_SITE_OTHER): Payer: Medicaid Other | Admitting: Surgical

## 2021-05-17 ENCOUNTER — Other Ambulatory Visit: Payer: Self-pay

## 2021-05-17 DIAGNOSIS — Z170 Estrogen receptor positive status [ER+]: Secondary | ICD-10-CM

## 2021-05-17 DIAGNOSIS — C50412 Malignant neoplasm of upper-outer quadrant of left female breast: Secondary | ICD-10-CM

## 2021-05-17 NOTE — Progress Notes (Signed)
Patient is a 45 year old female here for follow-up after removal of tissue expanders and placement with gel implants with Dr. [name] on 04/25/2021.  She is 3 weeks postop.  She is bothered by bilateral upper pole volume loss and would like to know what can be done for this.  She is also bothered by some excess axillary tissue on the right side.  She feels as if she had a lot more on the right side prior to surgery than she did on the left side and feels as if this may be contributing to the excess still present.  She is also bothered by the left implant falling laterally when she lays flat.  She is otherwise very happy and feels as if she has a good result.  Chaperone present on exam On exam bilateral breast incisions are intact, bilateral axillary incisions are intact.  There is no erythema or cellulitic changes.  Overall she has good breast symmetry.  When lying flat the left implant does fall laterally, but it overall is fairly subtle.  There is some volume loss in the upper and medial poles.  There is a little bit of excess axillary tissue on the right side versus left side  45 year old patient's status post exchange of tissue expanders to gel implants with Dr. [name] 3 weeks ago.  She is overall doing really well.  She overall has a great result.  She has some volume loss in the upper and medial poles which we discussed can be improved with fat grafting.  Recommend allowing the implants to settle and following up in the next 2 months for reevaluation.  At that time we can discuss fat grafting and reevaluate axillary tissue.  There is no signs of infection on exam.  Continue to avoid strenuous activities.

## 2021-05-19 ENCOUNTER — Telehealth: Payer: Self-pay

## 2021-05-19 NOTE — Telephone Encounter (Signed)
Ms Pehrson called stating that she had intercourse on 05-17-24.  This was 9 weeks and one day after Robotic assisted TAH for BRCA1 deleterious mutation on 03-14-21 with Dr. Denman George. She began to have bright red blood. Intercourse was stopped.  She had some spotting that night with some cramping. Yesterday some spotting  and called the on call Nurse who told her to call today to be seen.  She has not had any spotting or cramping today. Set her up to see Dr. Berline Lopes on Wednesday 05-24-21 for a vaginal cuff check.  Told her to go to the ED if the bleeding recurs and she is soaking through a pad in couple of hours or less.  She can call Monday 05-22-21  to be seen sooner if the bleeding increases but not soaking through a pad as noted above. Pt verbalized understanding.

## 2021-05-22 ENCOUNTER — Other Ambulatory Visit: Payer: Self-pay

## 2021-05-22 ENCOUNTER — Ambulatory Visit: Payer: Medicaid Other | Attending: General Surgery

## 2021-05-22 VITALS — Wt 317.2 lb

## 2021-05-22 DIAGNOSIS — Z483 Aftercare following surgery for neoplasm: Secondary | ICD-10-CM | POA: Insufficient documentation

## 2021-05-22 NOTE — Therapy (Signed)
[facility] @ [address] [place], [place], [address] Phone: [phone]   Fax:  [phone]  Physical Therapy Treatment  Patient Details  Name: Sheryl Mcmillan MRN: [id number] Date of Birth: Nov 22, 1975 Referring Provider (PT): Dr. [name]   Encounter Date: 05/22/2021   PT End of Session - 05/22/21 1007     Visit Number 16   # unchanged due to screen only   PT Start Time 1005    PT Stop Time 1010    PT Time Calculation (min) 5 min    Activity Tolerance Patient tolerated treatment well    Behavior During Therapy [facility] for tasks assessed/performed             Past Medical History:  Diagnosis Date   Arthritis    Breast cancer ([place])    Family history of non-Hodgkin's lymphoma 06/01/2020   GERD (gastroesophageal reflux disease)    Hemorrhoids    Migraines    PCOS (polycystic ovarian syndrome)    PONV (postoperative nausea and vomiting)     Past Surgical History:  Procedure Laterality Date   AXILLARY SENTINEL NODE BIOPSY Left 11/07/2020   Procedure: LEFT AXILLARY SENTINEL NODE BIOPSY;  Surgeon: [name], MD;  Location: [place];  Service: General;  Laterality: Left;   BREAST RECONSTRUCTION WITH PLACEMENT OF TISSUE EXPANDER AND FLEX HD (ACELLULAR HYDRATED DERMIS) Bilateral 11/07/2020   Procedure: BILATERAL BREAST RECONSTRUCTION WITH PLACEMENT OF TISSUE EXPANDER AND FLEX HD (ACELLULAR HYDRATED DERMIS);  Surgeon: [name], MD;  Location: [place];  Service: Plastics;  Laterality: Bilateral;   COSMETIC SURGERY     DILATION AND CURETTAGE OF UTERUS     HEMORRHOID SURGERY     IR IMAGING GUIDED PORT INSERTION  06/15/2020   MASS EXCISION Bilateral 04/25/2021   Procedure: excision of bilateral axillary breast tissue;  Surgeon: [name], MD;  Location: [place];  Service: Plastics;  Laterality: Bilateral;   PORT-A-CATH REMOVAL Right 11/07/2020   Procedure: REMOVAL PORT-A-CATH;  Surgeon:  [name], MD;  Location: [place];  Service: General;  Laterality: Right;   REMOVAL OF BILATERAL TISSUE EXPANDERS WITH PLACEMENT OF BILATERAL BREAST IMPLANTS Bilateral 04/25/2021   Procedure: REMOVAL OF BILATERAL TISSUE EXPANDERS WITH PLACEMENT OF BILATERAL BREAST IMPLANTS;  Surgeon: [name], MD;  Location: [place];  Service: Plastics;  Laterality: Bilateral;   ROBOTIC ASSISTED TOTAL HYSTERECTOMY WITH BILATERAL SALPINGO OOPHERECTOMY Bilateral 03/14/2021   Procedure: XI ROBOTIC ASSISTED TOTAL HYSTERECTOMY WITH BILATERAL SALPINGO OOPHORECTOMY;  Surgeon: Everitt Amber, MD;  Location[facility];  Service: Gynecology;  Laterality: Bilateral;   TOTAL MASTECTOMY Bilateral 11/07/2020   Procedure: BILATERAL MASTECTOMY;  Surgeon: [name], MD;  Location: [place];  Service: General;  Laterality: Bilateral;    Vitals:   05/22/21 1007  Weight: (!) 317 lb 4 oz (143.9 kg)     Subjective Assessment - 05/22/21 1006     Subjective Pt returns for her 3 month L-Dex screen. "I had my implant exchange which went well but I've has some tightness along my Lt axilla to the lateral aspect of my incision since then and would like to come back for physical therapy for this."    Pertinent History Patient was diagnosed on 05/10/2020 with left grade III invasive ductal carcinoma breast cancer. She had a bilateral mastectomy and left sentinel node biopsy (1 negative node) on 11/07/2020 It is ER positive, PR negative, and HER2 positive with a Ki67 of 50%.  She underwent plastic surgery to her left arm at age 45 when her arm was severely cut in a glass door.                    L-DEX FLOWSHEETS - 05/22/21 1000       L-DEX LYMPHEDEMA SCREENING   Measurement Type Unilateral    L-DEX MEASUREMENT EXTREMITY Upper Extremity    POSITION  Standing    DOMINANT SIDE Right    At Risk Side Left    BASELINE SCORE (UNILATERAL) 0.9    L-DEX SCORE (UNILATERAL) 0.6    VALUE CHANGE (UNILAT) -0.3                                      PT Long Term Goals - 02/02/21 1025       PT LONG TERM GOAL #1   Title Patient will demonstrate she has regained full shoulder ROM and function post opeeratively compared to baselines.    Baseline still with some pull; full A/ROM    Status Achieved      PT LONG TERM GOAL #2   Title Patient will increase bilateral shoulder active flexion to >/= 150 degrees for increased ease reaching overhead.    Baseline Rt ~150 and Lt ~130 degrees of P/ROM with stretching today-01/05/21; Rt 156 and Lt 154 degrees - 02/02/21    Status Achieved      PT LONG TERM GOAL #3   Title Patient will increase bilateral shoulder active abduction to >/= 140 degrees for increased ease reaching overhead.    Baseline Rt ~140 and Lt ~110 degrees of P/ROM (tightness at bil pectoralis limits end motionl)- 01/05/21; bil 170 degrees - 02/02/21    Status Achieved      PT LONG TERM GOAL #4   Title Patient will decrease her DASH score to </= 38.64 to achieve baseline arm function.    Baseline 54.55; 0 - 02/02/21    Status Achieved      PT LONG TERM GOAL #5   Title Patient will verbalize good understanding of lympehdema risk reduction practices.    Baseline No knowledge; pt has participated in the ABC Class and has no further questions reagrding lymphedema risk reduction-01/05/21    Status Achieved      PT LONG TERM GOAL #6   Title Patient will report she has decreased pain by >/= 50% with daily tasks to reduce need for medication and tolerate normal ADLs.    Baseline Pain currently varies from 5-10/10 with most daily tasks.; pt reports not needing pain medication any longer as her pain is much improved - 01/05/21    Status Achieved                   Plan - 05/22/21 1009     Clinical Impression Statement Pt returns for her 3 month L-Dex screen. Her change from baseline of -0.3 is WNLs but pt reports noting increased tightness along her Lt axilla to  lateral aspect of incision since implant exchange and would like to resume physical therapy for this. She will also benefit from continuing every 3 month L-Dex screens which pt is agreeable to.    PT Next Visit Plan Cont every 3 month L-Dex screens for up to 2 years from her SLNB (~11/08/22). Eval for reported tightness along Lt axilla to lateral aspect of incision    Consulted and Agree  with Plan of Care Patient             Patient will benefit from skilled therapeutic intervention in order to improve the following deficits and impairments:     Visit Diagnosis: Aftercare following surgery for neoplasm     Problem List Patient Active Problem List   Diagnosis Date Noted   Cholelithiasis 03/14/2021   Peripheral neuropathy due to chemotherapy ([place]) 10/20/2020   Morbid obesity with BMI of 40.0-44.9, adult ([place]) 07/19/2020   BRCA1 gene mutation positive 07/04/2020   Genetic testing 06/28/2020   Family history of non-Hodgkin's lymphoma 06/01/2020   Malignant neoplasm of upper-outer quadrant of left breast in female, estrogen receptor positive ([place]) 05/26/2020    Otelia Limes, PTA 05/22/2021, 12:26 PM  [facility] @ [place], [place], [address] Phone: [phone]   Fax:  [phone]  Name: Sheryl Mcmillan MRN: [id number] Date of Birth: 13-Aug-1975

## 2021-05-24 ENCOUNTER — Inpatient Hospital Stay (HOSPITAL_BASED_OUTPATIENT_CLINIC_OR_DEPARTMENT_OTHER): Payer: Medicaid Other | Admitting: Gynecologic Oncology

## 2021-05-24 ENCOUNTER — Other Ambulatory Visit: Payer: Self-pay

## 2021-05-24 VITALS — BP 144/96 | HR 100 | Temp 97.6°F | Resp 16 | Ht 71.0 in | Wt 319.0 lb

## 2021-05-24 DIAGNOSIS — Z5112 Encounter for antineoplastic immunotherapy: Secondary | ICD-10-CM | POA: Diagnosis not present

## 2021-05-24 DIAGNOSIS — Z90722 Acquired absence of ovaries, bilateral: Secondary | ICD-10-CM

## 2021-05-24 DIAGNOSIS — Z170 Estrogen receptor positive status [ER+]: Secondary | ICD-10-CM

## 2021-05-24 DIAGNOSIS — N939 Abnormal uterine and vaginal bleeding, unspecified: Secondary | ICD-10-CM

## 2021-05-24 DIAGNOSIS — Z90710 Acquired absence of both cervix and uterus: Secondary | ICD-10-CM

## 2021-05-24 NOTE — Patient Instructions (Signed)
It was good to see you.  Sometimes bleeding after surgery can be related to your stitches or to scar tissue from how you healed.  I do not see or feel any reason for the bleeding on your exam today.  I would recommend no intercourse for at least 3 weeks to allow any healing of what ever cause the bleeding.  If you have bleeding before that time or bleeding the next time you have intercourse, please call to see me back.

## 2021-05-24 NOTE — Progress Notes (Signed)
Gynecologic Oncology Return Clinic Visit  05/24/21  Reason for Visit: vaginal bleeding  Treatment History: Oncology History  Malignant neoplasm of upper-outer quadrant of left breast in female, estrogen receptor positive ([place])  05/26/2020 Initial Diagnosis   Malignant neoplasm of upper-outer quadrant of left breast in female, estrogen receptor positive ([place])   06/16/2020 - 09/30/2020 Chemotherapy      Patient is on Antibody Plan: BREAST TRASTUZUMAB Q21D     06/16/2020 Genetic Testing   Positive genetic testing: 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  Variant of uncertain significance in MSH3 at c.2724A>G (Silent).  No other pathogenic or uncertain variants were reported in the [facility] Multi-Cancer Panel.  The report date is June 16, 2020.    The variant of uncertain significance (VUS) in MSH3 at c.2724A>G (Silent) has been reclassified to likely benign.  The change in variant classification was made as a result of re-review of evidence in light of new variant interpretation guidelines and/or new information. The amended report date is January 10, 2021.   The Multi-Cancer Panel offered by [facility] includes sequencing and/or deletion duplication testing of the following 85 genes: AIP, ALK, APC, ATM, AXIN2,BAP1,  BARD1, BLM, BMPR1A, BRCA1, BRCA2, BRIP1, CASR, CDC73, CDH1, CDK4, CDKN1B, CDKN1C, CDKN2A (p14ARF), CDKN2A (p16INK4a), CEBPA, CHEK2, CTNNA1, DICER1, DIS3L2, EGFR (c.2369C>T, p.Thr790Met variant only), EPCAM (Deletion/duplication testing only), FH, FLCN, GATA2, GPC3, GREM1 (Promoter region deletion/duplication testing only), HOXB13 (c.251G>A, p.Gly84Glu), HRAS, KIT, MAX, MEN1, MET, MITF (c.952G>A, p.Glu318Lys variant only), MLH1, MSH2, MSH3, MSH6, MUTYH, NBN, NF1, NF2, NTHL1, PALB2, PDGFRA, PHOX2B, PMS2, POLD1, POLE, POT1, PRKAR1A, PTCH1, PTEN, RAD50, RAD51C, RAD51D, RB1, RECQL4, RET, RNF43, RUNX1, SDHAF2, SDHA (sequence changes only), SDHB, SDHC, SDHD, SMAD4, SMARCA4,  SMARCB1, SMARCE1, STK11, SUFU, TERC, TERT, TMEM127, TP53, TSC1, TSC2, VHL, WRN and WT1.    10/20/2020 - 10/20/2020 Chemotherapy    Patient is on Treatment Plan: BREAST  DOCETAXEL + CARBOPLATIN + TRASTUZUMAB + PERTUZUMAB  (TCHP) Q21D        10/20/2020 -  Chemotherapy   Patient is on Treatment Plan : BREAST Trastuzumab q21d       Interval History: Saw patient for post-op visit on 10/14.  Patient presents today after having intercourse on 11/23, 9 weeks after her surgery.  She had bright red bleeding during intercourse and stopped.  Bleeding quickly decreased but spotting continued into the following day.  Patient denies any bleeding or discharge since.  The day after this episode, she had some fatigue as well as abdominal and pelvic pain.  She reports the symptoms have resolved.  She continues to have normal bowel and bladder function.  She endorses a good appetite without nausea or emesis.  Past Medical/Surgical History: Past Medical History:  Diagnosis Date   Arthritis    Breast cancer ([place])    Family history of non-Hodgkin's lymphoma 06/01/2020   GERD (gastroesophageal reflux disease)    Hemorrhoids    Migraines    PCOS (polycystic ovarian syndrome)    PONV (postoperative nausea and vomiting)     Past Surgical History:  Procedure Laterality Date   AXILLARY SENTINEL NODE BIOPSY Left 11/07/2020   Procedure: LEFT AXILLARY SENTINEL NODE BIOPSY;  Surgeon: [name], MD;  Location: [place];  Service: General;  Laterality: Left;   BREAST RECONSTRUCTION WITH PLACEMENT OF TISSUE EXPANDER AND FLEX HD (ACELLULAR HYDRATED DERMIS) Bilateral 11/07/2020   Procedure: BILATERAL BREAST RECONSTRUCTION WITH PLACEMENT OF TISSUE EXPANDER AND FLEX HD (ACELLULAR HYDRATED DERMIS);  Surgeon: [name], MD;  Location: [place];  Service: Clinical cytogeneticist;  Laterality: Bilateral;   COSMETIC SURGERY     DILATION AND CURETTAGE OF UTERUS     HEMORRHOID SURGERY     IR IMAGING GUIDED PORT INSERTION  06/15/2020    MASS EXCISION Bilateral 04/25/2021   Procedure: excision of bilateral axillary breast tissue;  Surgeon: [name], MD;  Location: [place];  Service: Plastics;  Laterality: Bilateral;   PORT-A-CATH REMOVAL Right 11/07/2020   Procedure: REMOVAL PORT-A-CATH;  Surgeon: [name], MD;  Location: [place];  Service: General;  Laterality: Right;   REMOVAL OF BILATERAL TISSUE EXPANDERS WITH PLACEMENT OF BILATERAL BREAST IMPLANTS Bilateral 04/25/2021   Procedure: REMOVAL OF BILATERAL TISSUE EXPANDERS WITH PLACEMENT OF BILATERAL BREAST IMPLANTS;  Surgeon: [name], MD;  Location: [place];  Service: Plastics;  Laterality: Bilateral;   ROBOTIC ASSISTED TOTAL HYSTERECTOMY WITH BILATERAL SALPINGO OOPHERECTOMY Bilateral 03/14/2021   Procedure: XI ROBOTIC ASSISTED TOTAL HYSTERECTOMY WITH BILATERAL SALPINGO OOPHORECTOMY;  Surgeon: Everitt Amber, MD;  Location: [facility];  Service: Gynecology;  Laterality: Bilateral;   TOTAL MASTECTOMY Bilateral 11/07/2020   Procedure: BILATERAL MASTECTOMY;  Surgeon: [name], MD;  Location: [place];  Service: General;  Laterality: Bilateral;    Family History  Problem Relation Age of Onset   Hypertension Mother    Diabetes Mother    Cancer Mother 54       unknown primary   Other Mother        brain tumor; dx 11s   Non-Hodgkin's lymphoma Brother 67   Other Maternal Uncle 81       brain tumor   Breast cancer Other        MGM's niece; dx late 28s   Pancreatic cancer Neg Hx    Colon cancer Neg Hx    Endometrial cancer Neg Hx    Prostate cancer Neg Hx    Ovarian cancer Neg Hx     Social History   Socioeconomic History   Marital status: Single    Spouse name: Not on file   Number of children: 1   Years of education: Not on file   Highest education level: Some college, no degree  Occupational History   Not on file  Tobacco Use   Smoking status: Former    Packs/day: 0.50    Years: 10.00    Pack years: 5.00     Types: Cigarettes    Quit date: 08/23/2020    Years since quitting: 0.7   Smokeless tobacco: Never  Vaping Use   Vaping Use: Never used  Substance and Sexual Activity   Alcohol use: Yes    Comment: rare   Drug use: Yes    Types: Marijuana    Comment: 5-7 days/week   Sexual activity: Yes    Birth control/protection: None  Other Topics Concern   Not on file  Social History Narrative   Not on file   Social Determinants of Health   Financial Resource Strain: Not on file  Food Insecurity: Not on file  Transportation Needs: Not on file  Physical Activity: Not on file  Stress: Not on file  Social Connections: Not on file    Current Medications:  Current Outpatient Medications:    acetaminophen (TYLENOL) 650 MG CR tablet, Take 650-1,300 mg by mouth every 8 (eight) hours as needed for pain., Disp: , Rfl:    aspirin-acetaminophen-caffeine (EXCEDRIN MIGRAINE) 250-250-65 MG tablet, Take 2 tablets by mouth every 6 (six) hours as needed for headache., Disp: , Rfl:  BINAXNOW COVID-19 AG HOME TEST KIT, TEST AS DIRECTED TODAY, Disp: , Rfl:    COLLAGEN PO, Take 1 tablet by mouth daily., Disp: , Rfl:    Multiple Vitamins-Minerals (ONE-A-DAY WOMENS PO), Take 1 tablet by mouth daily., Disp: , Rfl:    NON FORMULARY, Apply 1 application topically 2 (two) times daily. Skinuva scar cream, Disp: , Rfl:    omeprazole (PRILOSEC) 40 MG capsule, Take 40 mg by mouth daily as needed (acid reflux)., Disp: , Rfl:    venlafaxine XR (EFFEXOR-XR) 37.5 MG 24 hr capsule, Take 1 capsule (37.5 mg total) by mouth daily with breakfast., Disp: 90 capsule, Rfl: 4  Review of Systems: Pertinent positives as per HPI. Denies appetite changes, fevers, chills, unexplained weight changes. Denies hearing loss, neck lumps or masses, mouth sores, ringing in ears or voice changes. Denies cough or wheezing.  Denies shortness of breath. Denies chest pain or palpitations. Denies leg swelling. Denies abdominal distention, blood  in stools, constipation, diarrhea, nausea, vomiting, or early satiety. Denies pain with intercourse, dysuria, frequency, hematuria or incontinence. Denies hot flashes or vaginal discharge.   Denies joint pain, back pain or muscle pain/cramps. Denies itching, rash, or wounds. Denies dizziness, headaches, numbness or seizures. Denies swollen lymph nodes or glands, denies easy bruising or bleeding. Denies anxiety, depression, confusion, or decreased concentration.  Physical Exam: BP (!) 144/96 (BP Location: Right Arm, Patient Position: Sitting)   Pulse 100   Temp 97.6 F (36.4 C) (Tympanic)   Resp 16   Ht 5' 11"  (1.803 m)   Wt (!) 319 lb (144.7 kg)   LMP 09/29/2020 (Approximate)   SpO2 97%   BMI 44.49 kg/m  General: Alert, oriented, no acute distress. HEENT: Normocephalic, atraumatic, sclera anicteric. Chest: Unlabored breathing on room air. Abdomen: Obese, soft, nontender.  Normoactive bowel sounds.  No masses or hepatosplenomegaly appreciated.  Well-healed incisions. Extremities: Grossly normal range of motion.  Warm, well perfused.  No edema bilaterally. Skin: No rashes or lesions noted. GU: Normal appearing external genitalia without erythema, excoriation, or lesions.  Speculum exam reveals well rugated vaginal mucosa.  Cuff is somewhat challenging to see with large speculum, but appears intact, no suture visible, no granulation tissue appreciated.  No bleeding or discharge noted.  Bimanual exam reveals cuff intact and smooth.  No fluctuance or tenderness with palpation.  Laboratory & Radiologic Studies: None new  Assessment & Plan: Sheryl Mcmillan is a 45 y.o. woman who is almost 10-week status post risk reducing surgery presenting with vaginal bleeding last week after sexual intercourse for the first time.  Reassured patient that based on my exam, her vaginal cuff is intact and I do not see obvious cause for her bleeding.  Cuff is intact and appears well-healed.  There is no  granulation tissue that could be treated today.  She may have had a small hematoma opened during intercourse.  I have suggested that she wait another 3-4 weeks before being sexually active again.  She will call let me know if she has any bleeding or discharge between now and then.   28 minutes of total time was spent for this patient encounter, including preparation, face-to-face counseling with the patient and coordination of care, and documentation of the encounter.  Jeral Pinch, MD  Division of Gynecologic Oncology  Department of Obstetrics and Gynecology  [facility]

## 2021-05-30 NOTE — Progress Notes (Signed)
[name]  [phone] [phone]) [phone]    ID: Sheryl Mcmillan DOB: [birth year]  MR#: [id number]  DXA#:[id number]  Patient Care Team: Windell Hummingbird, PA-C as PCP - General (Physician Assistant) [name], RN as Oncology Nurse Navigator [name], RN as Oncology Nurse Navigator [name], MD as Consulting Physician (General Surgery) Herman Fiero, [name], MD as Consulting Physician (Oncology) [name], MD as Consulting Physician (Radiation Oncology) [name], MD as Consulting Physician (Plastic Surgery) [name], MD OTHER MD:  CHIEF COMPLAINT: estrogen receptor negative, Her2 positive breast cancer (s/Mcmillan bilateral mastectomies); BRCA1+  CURRENT TREATMENT: Trastuzumab   INTERVAL HISTORY: Sheryl Mcmillan returns today for follow up and treatment of her estrogen receptor negative, Her2 positive breast cancer accompanied by her daughter (who incidentally is BRCA negative)  Avalie is now receiving trastuzumab alone, to complete a year.  Her port was removed and she is receiving this subcutaneously as Hylecta.  She tolerates this well and in particular she has no symptoms suggestive of heart failure.  Her last dose is scheduled for 06/21/2021.  Her most recent echo was completed on 04/03/2021 and showed an ejection fraction of 55-60%, which is overall stable from prior.  However her baseline echo dose had been in the 65-70% range.  In between of course she had bilateral mastectomies with expanders in place and that may change the echo technique.  Nevertheless I think it would be prudent to repeat an echo before her final dose and I have entered that order  REVIEW OF SYSTEMS: Sheryl Mcmillan tells me she had sex just 6 weeks after her hysterectomy and she had significant bleeding.  She says she saw her gynecologist and everything is "okay" but she is going to wait a little bit longer before further experiences.  She is not currently exercising  regularly but she is planning to join [employer] at the beginning of the year.  She had an ingrown toenail cut from the left big toe and had a little bit of inflammation after that but it has cleared.  A detailed review of systems today was otherwise stable.    COVID 19 VACCINATION STATUS: Status post vaccine x2, no booster as of February 2022   HISTORY OF CURRENT ILLNESS: From the original intake note:   Sheryl Mcmillan presented with a palpable lump in the outer left breast, which she initially felt approximately 6 months prior. She waited to seek medical attention because she had no insurance.  Eventually she enrolled in the [facility] program and underwent bilateral diagnostic mammography with tomography and left breast ultrasonography at [facility] on 05/10/2020 showing: breast density category B; vaguely palpable 2.5 cm mass in left breast at 1:30; no pathologic left axillary lymphadenopathy or evidence of right breast malignancy.  Accordingly on 05/18/2020 she proceeded to biopsy of the left breast area in question. The pathology from this procedure ([id number]) showed: invasive ductal carcinoma, grade 3. Prognostic indicators significant for: estrogen receptor, 40% positive with weak staining intensity and progesterone receptor, 0% negative. Proliferation marker Ki67 at 50%. HER2 positive by immunohistochemistry (3+).  The patient's subsequent history is as detailed below.   PAST MEDICAL HISTORY: Past Medical History:  Diagnosis Date   Arthritis    Breast cancer ([place])    Family history of non-Hodgkin's lymphoma 06/01/2020   GERD (gastroesophageal reflux disease)    Hemorrhoids    Migraines    PCOS (polycystic ovarian syndrome)    PONV (postoperative nausea and vomiting)  PAST SURGICAL HISTORY: Past Surgical History:  Procedure Laterality Date   AXILLARY SENTINEL NODE BIOPSY Left 11/07/2020   Procedure: LEFT AXILLARY SENTINEL NODE BIOPSY;  Surgeon: [name],  MD;  Location: [place];  Service: General;  Laterality: Left;   BREAST RECONSTRUCTION WITH PLACEMENT OF TISSUE EXPANDER AND FLEX HD (ACELLULAR HYDRATED DERMIS) Bilateral 11/07/2020   Procedure: BILATERAL BREAST RECONSTRUCTION WITH PLACEMENT OF TISSUE EXPANDER AND FLEX HD (ACELLULAR HYDRATED DERMIS);  Surgeon: [name], MD;  Location: [place];  Service: Plastics;  Laterality: Bilateral;   COSMETIC SURGERY     DILATION AND CURETTAGE OF UTERUS     HEMORRHOID SURGERY     IR IMAGING GUIDED PORT INSERTION  06/15/2020   MASS EXCISION Bilateral 04/25/2021   Procedure: excision of bilateral axillary breast tissue;  Surgeon: [name], MD;  Location: [place];  Service: Plastics;  Laterality: Bilateral;   PORT-A-CATH REMOVAL Right 11/07/2020   Procedure: REMOVAL PORT-A-CATH;  Surgeon: [name], MD;  Location: [place];  Service: General;  Laterality: Right;   REMOVAL OF BILATERAL TISSUE EXPANDERS WITH PLACEMENT OF BILATERAL BREAST IMPLANTS Bilateral 04/25/2021   Procedure: REMOVAL OF BILATERAL TISSUE EXPANDERS WITH PLACEMENT OF BILATERAL BREAST IMPLANTS;  Surgeon: [name], MD;  Location: [place];  Service: Plastics;  Laterality: Bilateral;   ROBOTIC ASSISTED TOTAL HYSTERECTOMY WITH BILATERAL SALPINGO OOPHERECTOMY Bilateral 03/14/2021   Procedure: XI ROBOTIC ASSISTED TOTAL HYSTERECTOMY WITH BILATERAL SALPINGO OOPHORECTOMY;  Surgeon: Everitt Amber, MD;  Location: [facility];  Service: Gynecology;  Laterality: Bilateral;   TOTAL MASTECTOMY Bilateral 11/07/2020   Procedure: BILATERAL MASTECTOMY;  Surgeon: [name], MD;  Location: [place];  Service: General;  Laterality: Bilateral;    FAMILY HISTORY: Family History  Problem Relation Age of Onset   Hypertension Mother    Diabetes Mother    Cancer Mother 51       unknown primary   Other Mother        brain tumor; dx 71s   Non-Hodgkin's lymphoma Brother 77   Other Maternal Uncle 12       brain tumor    Breast cancer Other        MGM's niece; dx late 53s   Pancreatic cancer Neg Hx    Colon cancer Neg Hx    Endometrial cancer Neg Hx    Prostate cancer Neg Hx    Ovarian cancer Neg Hx    She has no information on her father. Her mother died at age 81 from stage IV cancer. The primary cancer was unknown, but it was not breast.  Sheryl Mcmillan has two half brothers (through her mother), one of which was diagnosed with non-Hodgkin's lymphoma. There is no family history of breast, ovarian, or prostate cancer to her knowledge. // Patient is BRCA1 positive. Daughter tested negative.   GYNECOLOGIC HISTORY:  Patient's last menstrual period was 09/29/2020 (approximate). Menarche: 45 years old Age at first live birth: 45 years old Sheryl Mcmillan 1 LMP 05/02/2020 Contraceptive: never used HRT n/a  Hysterectomy? no BSO? 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 She is single. Her significant other [name] is a Freight forwarder. She lives at home with [name], her daughter [name],  [name]'s daughter [name] and her 65-monthold daughter [name] Daughter [name] age 44 is a bChief Operating Officerat [employer] [place] [employer]    ADVANCED DIRECTIVES: Completed and notarized 09/07/2020.  She has named her aunt [name] as healthcare power of attorney   HEALTH MAINTENANCE: Social History   Tobacco Use   Smoking status: Former    Packs/day: 0.50    Years: 10.00    Pack years: 5.00    Types: Cigarettes    Quit date: 08/23/2020    Years since quitting: 0.7   Smokeless tobacco: Never  Vaping Use   Vaping Use: Never used  Substance Use Topics   Alcohol use: Yes    Comment: rare   Drug use: Yes    Types: Marijuana    Comment: 5-7 days/week     Colonoscopy: 2018  PAP: 2018  Bone density: n/a (age)   Allergies  Allergen Reactions   Carboplatin Hives and Itching    Carboplatin reaction after dose  #5 requiring Benadryl, Solumedrol, epinephrine, Claritin.   Other Hives    Baking Soda   Wound Dressing Adhesive Hives    Rips skin, prefers paper tape    Current Outpatient Medications  Medication Sig Dispense Refill   acetaminophen (TYLENOL) 650 MG CR tablet Take 650-1,300 mg by mouth every 8 (eight) hours as needed for pain.     aspirin-acetaminophen-caffeine (EXCEDRIN MIGRAINE) 250-250-65 MG tablet Take 2 tablets by mouth every 6 (six) hours as needed for headache.     BINAXNOW COVID-19 AG HOME TEST KIT TEST AS DIRECTED TODAY     COLLAGEN PO Take 1 tablet by mouth daily.     Multiple Vitamins-Minerals (ONE-A-DAY WOMENS PO) Take 1 tablet by mouth daily.     NON FORMULARY Apply 1 application topically 2 (two) times daily. Skinuva scar cream     omeprazole (PRILOSEC) 40 MG capsule Take 40 mg by mouth daily as needed (acid reflux).     venlafaxine XR (EFFEXOR-XR) 37.5 MG 24 hr capsule Take 1 capsule (37.5 mg total) by mouth daily with breakfast. 90 capsule 4   No current facility-administered medications for this visit.    OBJECTIVE: White woman who appears stated age 17:   05/31/21 1009  BP: (!) 147/87  Pulse: 96  Resp: 18  Temp: 97.7 F (36.5 C)  SpO2: 99%   Body mass index is 44.09 kg/m. Filed Weights   05/31/21 1009  Weight: (!) 316 lb 1.6 oz (143.4 kg)   Sclerae unicteric, EOMs intact Wearing a mask No cervical or supraclavicular adenopathy Lungs no rales or rhonchi Heart regular rate and rhythm Abd soft, nontender, positive bowel sounds MSK no focal spinal tenderness, no upper extremity lymphedema Neuro: nonfocal, well oriented, appropriate affect Breasts: Status post bilateral mastectomies with bilateral reconstruction.  She has detachable nipples in place, which she obtains through [place] she tells me.  There is no evidence of residual or recurrent disease.  Both axillae are benign.   LAB RESULTS:  CMP     Component Value Date/Time   NA 142 05/11/2021  0858   K 4.0 05/11/2021 0858   CL 108 05/11/2021 0858   CO2 22 05/11/2021 0858   GLUCOSE 131 (H) 05/11/2021 0858   BUN 11 05/11/2021 0858   CREATININE 0.80 05/11/2021 0858   CALCIUM 9.0 05/11/2021 0858   PROT 7.2 05/11/2021 0858   ALBUMIN 3.7 05/11/2021 0858   AST 24 05/11/2021 0858   ALT 37 05/11/2021 0858   ALKPHOS 105 05/11/2021 0858   BILITOT 0.2 (L) 05/11/2021 0858   GFRNONAA >60 05/11/2021 0858   GFRAA >60 06/06/2019 1205    No results found for: TOTALPROTELP, ALBUMINELP, A1GS, A2GS, BETS, BETA2SER,  GAMS, MSPIKE, SPEI  Lab Results  Component Value Date   WBC 9.5 05/31/2021   NEUTROABS 5.8 05/31/2021   HGB 13.3 05/31/2021   HCT 41.0 05/31/2021   MCV 83.3 05/31/2021   PLT 332 05/31/2021    No results found for: LABCA2  No components found for: [id number]  No results for input(s): INR in the last 168 hours.  No results found for: LABCA2  No results found for: CAN199  Lab Results  Component Value Date   CAN125 7.4 12/15/2020    No results found for: ZHG992  No results found for: CA2729  No components found for: HGQUANT  No results found for: CEA1 / No results found for: CEA1   No results found for: AFPTUMOR  No results found for: CHROMOGRNA  No results found for: KPAFRELGTCHN, LAMBDASER, KAPLAMBRATIO (kappa/lambda light chains)  No results found for: HGBA, HGBA2QUANT, HGBFQUANT, HGBSQUAN (Hemoglobinopathy evaluation)   No results found for: LDH  No results found for: IRON, TIBC, IRONPCTSAT (Iron and TIBC)  No results found for: FERRITIN  Urinalysis    Component Value Date/Time   COLORURINE YELLOW 06/06/2019 [place] 06/06/2019 1205   LABSPEC >1.030 (H) 06/06/2019 1205   PHURINE 5.5 06/06/2019 [place] 06/06/2019 1205   HGBUR TRACE (A) 06/06/2019 [place] 06/06/2019 [place] 06/06/2019 [place] 06/06/2019 1205   UROBILINOGEN 1.0 05/09/2011 1805    NITRITE NEGATIVE 06/06/2019 [place] 06/06/2019 1205    STUDIES: No results found.   ELIGIBLE FOR AVAILABLE RESEARCH PROTOCOL: AET  ASSESSMENT: 45 y.o. BRCA positive [place] woman status post left breast upper outer quadrant biopsy 05/18/2020 for a clinical T2N0, stage IIA invasive ductal carcinoma, grade 3, estrogen receptor weakly positive, progesterone receptor negative, with an MIB-1 of 50%, and HER-2 positive by immunohistochemistry  (1) genetics testing 06/16/2020 shows a pathogenic mutation in BRCA1 at c.2475del (Mcmillan.Asp825Glufs*21)  (a)  Variant of uncertain significance in MSH3 at c.2724A>G (Silent).  (b) no additional deleterious mutations through the Multi-Cancer Panel offered by [facility] including AIP, ALK, APC, ATM, AXIN2,BAP1,  BARD1, BLM, BMPR1A, BRCA1, BRCA2, BRIP1, CASR, CDC73, CDH1, CDK4, CDKN1B, CDKN1C, CDKN2A (p14ARF), CDKN2A (p16INK4a), CEBPA, CHEK2, CTNNA1, DICER1, DIS3L2, EGFR (c.2369C>T, Mcmillan.Thr790Met variant only), EPCAM (Deletion/duplication testing only), FH, FLCN, GATA2, GPC3, GREM1 (Promoter region deletion/duplication testing only), HOXB13 (c.251G>A, Mcmillan.Gly84Glu), HRAS, KIT, MAX, MEN1, MET, MITF (c.952G>A, Mcmillan.Glu318Lys variant only), MLH1, MSH2, MSH3, MSH6, MUTYH, NBN, NF1, NF2, NTHL1, PALB2, PDGFRA, PHOX2B, PMS2, POLD1, POLE, POT1, PRKAR1A, PTCH1, PTEN, RAD50, RAD51C, RAD51D, RB1, RECQL4, RET, RNF43, RUNX1, SDHAF2, SDHA (sequence changes only), SDHB, SDHC, SDHD, SMAD4, SMARCA4, SMARCB1, SMARCE1, STK11, SUFU, TERC, TERT, TMEM127, TP53, TSC1, TSC2, VHL, WRN and WT1.   (2) neoadjuvant chemotherapy to consist of carboplatin and docetaxel together with trastuzumab and Pertuzumab every 3 weeks x 6, starting 06/16/2020, completed 09/28/2020   (a) pertuzumab omitted after cycle 1  (3) trastuzumab to be continued to complete 1 year (through December 2022)  (a) echo 06/09/2020 shows EF in the 60-65% range  (b) echo 09/14/2020 shows an ejection fraction in  65-70% range.  (c) echo 01/18/2021 showed an ejection fraction in the 60-65 % range  (d) echo 04/03/2021 showed an ejection fraction in the 55 to 60% range.  (4) status post bilateral mastectomies 11/07/2020 showing  (A) on the right, no evidence of malignancy  (B) on the left, residual ypT1c ypN0 invasive ductal carcinoma, grade 3, with  negative margins, now triple negative, with an MIB-1 of 15%.  (5) bilateral salpingo-oophorectomy and hysterectomy with incidental cholecystectomy 03/14/2021 showed benign pathology   (6) olaparib was discussed but is not approved for HER-2 positive patients: the OPHELIA trial exploring this has closed due to poor enrollment.  Studies exploring olaparib and HER2 positive disease available in [place] currently require either newly diagnosed inflammatory breast cancer or recurrent/metastatic disease as entry criteria    PLAN: Sisk completes her year of trastuzumab with her next dose 3 weeks from now.  She will meet her new medical oncologist at that time and discuss further follow-up schedules.  I have put her in for 1 last echo to be done before that visit since it has been a small asymptomatic drop in her ejection fraction noted with her last echo.  I do not anticipate any significant finding but do want to document that  Otherwise I encouraged her to exercise more regularly, the goal being 45 minutes of some exercise daily at least 5 days a week.  I am delighted that they are going to join [employer]  And know to call us for any other issue that may develop before the next visit  Total encounter time 20 minutes.Sarajane Jews C. Jillianna Stanek, MD 05/31/21 10:29 AM Medical Oncology and Hematology [facility] [place], [place] [address] Tel. [phone]    Fax. [phone]   I, Wilburn Mylar, am acting as scribe for Dr. [name]. Merlyn Bollen.  I, Lurline Del MD, have reviewed the above documentation for accuracy  and completeness, and I agree with the above.    *Total Encounter Time as defined by the Centers for Medicare and Medicaid Services includes, in addition to the face-to-face time of a patient visit (documented in the note above) non-face-to-face time: obtaining and reviewing outside history, ordering and reviewing medications, tests or procedures, care coordination (communications with other health care professionals or caregivers) and documentation in the medical record.

## 2021-05-31 ENCOUNTER — Inpatient Hospital Stay: Payer: Medicaid Other | Attending: Oncology

## 2021-05-31 ENCOUNTER — Inpatient Hospital Stay: Payer: Medicaid Other

## 2021-05-31 ENCOUNTER — Inpatient Hospital Stay (HOSPITAL_BASED_OUTPATIENT_CLINIC_OR_DEPARTMENT_OTHER): Payer: Medicaid Other | Admitting: Oncology

## 2021-05-31 ENCOUNTER — Other Ambulatory Visit: Payer: Self-pay

## 2021-05-31 VITALS — BP 147/87 | HR 96 | Temp 97.7°F | Resp 18 | Ht 71.0 in | Wt 316.1 lb

## 2021-05-31 DIAGNOSIS — Z170 Estrogen receptor positive status [ER+]: Secondary | ICD-10-CM

## 2021-05-31 DIAGNOSIS — Z87891 Personal history of nicotine dependence: Secondary | ICD-10-CM | POA: Diagnosis not present

## 2021-05-31 DIAGNOSIS — C50412 Malignant neoplasm of upper-outer quadrant of left female breast: Secondary | ICD-10-CM | POA: Insufficient documentation

## 2021-05-31 DIAGNOSIS — Z9013 Acquired absence of bilateral breasts and nipples: Secondary | ICD-10-CM | POA: Insufficient documentation

## 2021-05-31 DIAGNOSIS — Z5189 Encounter for other specified aftercare: Secondary | ICD-10-CM | POA: Diagnosis not present

## 2021-05-31 DIAGNOSIS — Z90710 Acquired absence of both cervix and uterus: Secondary | ICD-10-CM | POA: Insufficient documentation

## 2021-05-31 DIAGNOSIS — Z171 Estrogen receptor negative status [ER-]: Secondary | ICD-10-CM | POA: Diagnosis not present

## 2021-05-31 DIAGNOSIS — Z803 Family history of malignant neoplasm of breast: Secondary | ICD-10-CM | POA: Diagnosis not present

## 2021-05-31 DIAGNOSIS — Z5112 Encounter for antineoplastic immunotherapy: Secondary | ICD-10-CM | POA: Insufficient documentation

## 2021-05-31 DIAGNOSIS — Z1501 Genetic susceptibility to malignant neoplasm of breast: Secondary | ICD-10-CM

## 2021-05-31 DIAGNOSIS — Z807 Family history of other malignant neoplasms of lymphoid, hematopoietic and related tissues: Secondary | ICD-10-CM | POA: Insufficient documentation

## 2021-05-31 DIAGNOSIS — Z1509 Genetic susceptibility to other malignant neoplasm: Secondary | ICD-10-CM

## 2021-05-31 DIAGNOSIS — Z90722 Acquired absence of ovaries, bilateral: Secondary | ICD-10-CM | POA: Insufficient documentation

## 2021-05-31 DIAGNOSIS — Z79899 Other long term (current) drug therapy: Secondary | ICD-10-CM | POA: Diagnosis not present

## 2021-05-31 DIAGNOSIS — Z9079 Acquired absence of other genital organ(s): Secondary | ICD-10-CM | POA: Insufficient documentation

## 2021-05-31 DIAGNOSIS — Z809 Family history of malignant neoplasm, unspecified: Secondary | ICD-10-CM | POA: Insufficient documentation

## 2021-05-31 LAB — CMP (CANCER CENTER ONLY)
ALT: 38 U/L (ref 0–44)
AST: 30 U/L (ref 15–41)
Albumin: 3.8 g/dL (ref 3.5–5.0)
Alkaline Phosphatase: 97 U/L (ref 38–126)
Anion gap: 12 (ref 5–15)
BUN: 10 mg/dL (ref 6–20)
CO2: 21 mmol/L — ABNORMAL LOW (ref 22–32)
Calcium: 9.1 mg/dL (ref 8.9–10.3)
Chloride: 109 mmol/L (ref 98–111)
Creatinine: 0.85 mg/dL (ref 0.44–1.00)
GFR, Estimated: >60 mL/min (ref >60)
Glucose, Bld: 136 mg/dL — ABNORMAL HIGH (ref 70–99)
Potassium: 4.5 mmol/L (ref 3.5–5.1)
Sodium: 142 mmol/L (ref 135–145)
Total Bilirubin: 0.2 mg/dL — ABNORMAL LOW (ref 0.3–1.2)
Total Protein: 7.1 g/dL (ref 6.5–8.1)

## 2021-05-31 LAB — CBC WITH DIFFERENTIAL (CANCER CENTER ONLY)
Abs Immature Granulocytes: 0.03 10*3/uL (ref 0.00–0.07)
Basophils Absolute: 0.1 10*3/uL (ref 0.0–0.1)
Basophils Relative: 1 %
Eosinophils Absolute: 0.2 10*3/uL (ref 0.0–0.5)
Eosinophils Relative: 2 %
HCT: 41 % (ref 36.0–46.0)
Hemoglobin: 13.3 g/dL (ref 12.0–15.0)
Immature Granulocytes: 0 %
Lymphocytes Relative: 31 %
Lymphs Abs: 3 10*3/uL (ref 0.7–4.0)
MCH: 27 pg (ref 26.0–34.0)
MCHC: 32.4 g/dL (ref 30.0–36.0)
MCV: 83.3 fL (ref 80.0–100.0)
Monocytes Absolute: 0.4 10*3/uL (ref 0.1–1.0)
Monocytes Relative: 4 %
Neutro Abs: 5.8 10*3/uL (ref 1.7–7.7)
Neutrophils Relative %: 62 %
Platelet Count: 332 10*3/uL (ref 150–400)
RBC: 4.92 MIL/uL (ref 3.87–5.11)
RDW: 16 % — ABNORMAL HIGH (ref 11.5–15.5)
WBC Count: 9.5 10*3/uL (ref 4.0–10.5)
nRBC: 0 % (ref 0.0–0.2)

## 2021-05-31 MED ORDER — TRASTUZUMAB-HYALURONIDASE-OYSK 600-10000 MG-UNT/5ML ~~LOC~~ SOLN
600.0000 mg | Freq: Once | SUBCUTANEOUS | Status: AC
  Administered 2021-05-31: 600 mg via SUBCUTANEOUS
  Filled 2021-05-31: qty 5

## 2021-05-31 NOTE — Patient Instructions (Signed)
[facility] MEDICAL [facility]COLOG[facility]  Discharge Instructions: ?Thank you for choosing [facility] to provide your oncology and hematology care.  ? ?If you have a lab appointment with the Cancer Center, please go directly to the Cancer Center and check in at the registration area. ?  ?Wear comfortable clothing and clothing appropriate for easy access to any Portacath or PICC line.  ? ?We strive to give you quality time with your provider. You may need to reschedule your appointment if you arrive late (15 or more minutes).  Arriving late affects you and other patients whose appointments are after yours.  Also, if you miss three or more appointments without notifying the office, you may be dismissed from the clinic at the provider?s discretion.    ?  ?For prescription refill requests, have your pharmacy contact our office and allow 72 hours for refills to be completed.   ? ?Today you received the following chemotherapy and/or immunotherapy agents: Trastuzumab    ?  ?To help prevent nausea and vomiting after your treatment, we encourage you to take your nausea medication as directed. ? ?BELOW ARE SYMPTOMS THAT SHOULD BE REPORTED IMMEDIATELY: ?*FEVER GREATER THAN 100.4 F (38 ?C) OR HIGHER ?*CHILLS OR SWEATING ?*NAUSEA AND VOMITING THAT IS NOT CONTROLLED WITH YOUR NAUSEA MEDICATION ?*UNUSUAL SHORTNESS OF BREATH ?*UNUSUAL BRUISING OR BLEEDING ?*URINARY PROBLEMS (pain or burning when urinating, or frequent urination) ?*BOWEL PROBLEMS (unusual diarrhea, constipation, pain near the anus) ?TENDERNESS IN MOUTH AND THROAT WITH OR WITHOUT PRESENCE OF ULCERS (sore throat, sores in mouth, or a toothache) ?UNUSUAL RASH, SWELLING OR PAIN  ?UNUSUAL VAGINAL DISCHARGE OR ITCHING  ? ?Items with * indicate a potential emergency and should be followed up as soon as possible or go to the Emergency Department if any problems should occur. ? ?Please show the CHEMOTHERAPY ALERT CARD or IMMUNOTHERAPY ALERT CARD at check-in  to the Emergency Department and triage nurse. ? ?Should you have questions after your visit or need to cancel or reschedule your appointment, please contact [facility]  Dept: [phone]  and follow the prompts.  Office hours are 8:00 a.m. to 4:30 p.m. Monday - Friday. Please note that voicemails left after 4:00 p.m. may not be returned until the following business day.  We are closed weekends and major holidays. You have access to a nurse at all times for urgent questions. Please call the main number to the clinic Dept: [phone] and follow the prompts. ? ? ?For any non-urgent questions, you may also contact your provider using MyChart. We now offer e-Visits for anyone 18 and older to request care online for non-urgent symptoms. For details visit [email or web address]. ?  ?Also download the MyChart app! Go to the app store, search "MyChart", open the app, select [facility], and log in with your MyChart username and password. ? ?Due to Covid, a mask is required upon entering the hospital/clinic. If you do not have a mask, one will be given to you upon arrival. For doctor visits, patients may have 1 support person aged 18 or older with them. For treatment visits, patients cannot have anyone with them due to current Covid guidelines and our immunocompromised population.  ? ?

## 2021-06-05 ENCOUNTER — Encounter: Payer: Medicaid Other | Admitting: Physical Therapy

## 2021-06-08 ENCOUNTER — Ambulatory Visit (HOSPITAL_COMMUNITY)
Admission: RE | Admit: 2021-06-08 | Discharge: 2021-06-08 | Disposition: A | Discharge status: Home / Self Care | Payer: Medicaid Other | Source: Ambulatory Visit | Attending: Oncology | Admitting: Oncology

## 2021-06-08 DIAGNOSIS — I517 Cardiomegaly: Secondary | ICD-10-CM | POA: Diagnosis not present

## 2021-06-08 DIAGNOSIS — Z1509 Genetic susceptibility to other malignant neoplasm: Secondary | ICD-10-CM | POA: Insufficient documentation

## 2021-06-08 DIAGNOSIS — Z1501 Genetic susceptibility to malignant neoplasm of breast: Secondary | ICD-10-CM | POA: Diagnosis not present

## 2021-06-08 DIAGNOSIS — Z01818 Encounter for other preprocedural examination: Secondary | ICD-10-CM | POA: Insufficient documentation

## 2021-06-08 DIAGNOSIS — Z170 Estrogen receptor positive status [ER+]: Secondary | ICD-10-CM | POA: Diagnosis not present

## 2021-06-08 DIAGNOSIS — C50412 Malignant neoplasm of upper-outer quadrant of left female breast: Secondary | ICD-10-CM | POA: Diagnosis present

## 2021-06-08 DIAGNOSIS — Z0189 Encounter for other specified special examinations: Secondary | ICD-10-CM | POA: Diagnosis not present

## 2021-06-08 LAB — ECHOCARDIOGRAM COMPLETE
Area-P 1/2: 3.54 cm2
S' Lateral: 3.7 cm

## 2021-06-08 MED ORDER — PERFLUTREN LIPID MICROSPHERE
1.0000 mL | INTRAVENOUS | Status: AC | PRN
  Administered 2021-06-08: 2 mL via INTRAVENOUS
  Filled 2021-06-08: qty 10

## 2021-06-12 ENCOUNTER — Telehealth: Payer: Self-pay | Admitting: Hematology and Oncology

## 2021-06-12 NOTE — Telephone Encounter (Signed)
Scheduled appointment per provider. Patient aware.  

## 2021-06-15 ENCOUNTER — Ambulatory Visit (INDEPENDENT_AMBULATORY_CARE_PROVIDER_SITE_OTHER): Payer: Medicaid Other | Admitting: Plastic Surgery

## 2021-06-15 ENCOUNTER — Other Ambulatory Visit: Payer: Self-pay

## 2021-06-15 DIAGNOSIS — Z170 Estrogen receptor positive status [ER+]: Secondary | ICD-10-CM

## 2021-06-15 DIAGNOSIS — C50412 Malignant neoplasm of upper-outer quadrant of left female breast: Secondary | ICD-10-CM

## 2021-06-15 NOTE — Progress Notes (Signed)
Patient presents about 6 weeks out from exchange of bilateral breast tissue expanders for gel implants.  She is overall reasonably happy with how things have come along.  She is bothered by lack of fullness in the upper pole and some excess axillary skin on the right side.  On exam her incisions of healed nicely.  She does have some concavity in the upper pole and some relative excess of skin in the right axillary area.  I do think that the upper pole fullness can be improved with fat grafting in the lateral area could be improved with some additional excision.  We will plan to give her a few months to recover before considering any revision procedures but would expect that to be recommended down the line.  All of her questions were answered.

## 2021-06-20 NOTE — Progress Notes (Signed)
Sheryl Mcmillan  [phone] [phone]    ID: LEBA TIBBITTS DOB: 1976/01/05  MR#: [id number]  EFU#:[id number]  Patient Care Team: Windell Hummingbird, PA-C as PCP - General (Physician Assistant) [name], RN as Oncology Nurse Navigator [name], RN as Oncology Nurse Navigator [name], MD as Consulting Physician (General Surgery) Magrinat, [name], MD as Consulting Physician (Oncology) [name], MD as Consulting Physician (Radiation Oncology) [name], MD as Consulting Physician (Plastic Surgery) Benay Pike, MD OTHER MD:  CHIEF COMPLAINT: estrogen receptor negative, 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  Her port was removed and she is receiving this subcutaneously as Hylecta.  She tolerates this well and in particular she has no symptoms suggestive of heart failure.  Her last dose is scheduled for 06/21/2021. There was some concern about her EF dropping from baseline, but since her EF has remained stable since October, Dr Jana Hakim has elected to continue trastuzumab. She is here before her last trastuzumab. Most recent ECHO with stable EF of 55-60%  REVIEW OF SYSTEMS:     COVID 19 VACCINATION STATUS: Status post vaccine x2, no booster as of February 2022   HISTORY OF CURRENT ILLNESS: From the original intake note:   Sheryl Mcmillan presented with a palpable lump in the outer left breast, which she initially felt approximately 6 months prior. She waited to seek medical attention because she had no insurance.  Eventually she enrolled in the [facility] program and underwent bilateral diagnostic mammography  with tomography and left breast ultrasonography at [facility] on 05/10/2020 showing: breast density category B; vaguely palpable 2.5 cm mass in left breast at 1:30; no pathologic left axillary lymphadenopathy or evidence of right breast malignancy.  Accordingly on 05/18/2020 she proceeded to biopsy of the left breast area in question. The pathology from this procedure ([id number]) showed: invasive ductal carcinoma, grade 3. Prognostic indicators significant for: estrogen receptor, 40% positive with weak staining intensity and progesterone receptor, 0% negative. Proliferation marker Ki67 at 50%. HER2 positive by immunohistochemistry (3+).  The patient's subsequent history is as detailed below.   PAST MEDICAL HISTORY: Past Medical History:  Diagnosis Date   Arthritis    Breast cancer ([place])    Family history of non-Hodgkin's lymphoma 06/01/2020   GERD (gastroesophageal reflux disease)    Hemorrhoids    Migraines    PCOS (polycystic ovarian syndrome)    PONV (postoperative nausea and vomiting)     PAST SURGICAL HISTORY: Past Surgical History:  Procedure Laterality Date   AXILLARY SENTINEL NODE BIOPSY Left 11/07/2020   Procedure: LEFT AXILLARY SENTINEL NODE BIOPSY;  Surgeon: [name], MD;  Location: [place];  Service: General;  Laterality: Left;   BREAST RECONSTRUCTION WITH PLACEMENT OF TISSUE EXPANDER AND FLEX HD (ACELLULAR HYDRATED DERMIS) Bilateral 11/07/2020   Procedure: BILATERAL BREAST RECONSTRUCTION WITH PLACEMENT OF TISSUE EXPANDER AND FLEX HD (ACELLULAR HYDRATED DERMIS);  Surgeon: [name], MD;  Location: [place];  Service: Plastics;  Laterality: Bilateral;   COSMETIC SURGERY     DILATION AND CURETTAGE OF UTERUS     HEMORRHOID SURGERY     IR IMAGING GUIDED PORT INSERTION  06/15/2020   MASS EXCISION Bilateral 04/25/2021  Procedure: excision of bilateral axillary breast tissue;  Surgeon: [name], MD;  Location: [facility];  Service: Plastics;   Laterality: Bilateral;   PORT-A-CATH REMOVAL Right 11/07/2020   Procedure: REMOVAL PORT-A-CATH;  Surgeon: [name], MD;  Location: [place];  Service: General;  Laterality: Right;   REMOVAL OF BILATERAL TISSUE EXPANDERS WITH PLACEMENT OF BILATERAL BREAST IMPLANTS Bilateral 04/25/2021   Procedure: REMOVAL OF BILATERAL TISSUE EXPANDERS WITH PLACEMENT OF BILATERAL BREAST IMPLANTS;  Surgeon: [name], MD;  Location: [place];  Service: Plastics;  Laterality: Bilateral;   ROBOTIC ASSISTED TOTAL HYSTERECTOMY WITH BILATERAL SALPINGO OOPHERECTOMY Bilateral 03/14/2021   Procedure: XI ROBOTIC ASSISTED TOTAL HYSTERECTOMY WITH BILATERAL SALPINGO OOPHORECTOMY;  Surgeon: Everitt Amber, MD;  Location: [facility];  Service: Gynecology;  Laterality: Bilateral;   TOTAL MASTECTOMY Bilateral 11/07/2020   Procedure: BILATERAL MASTECTOMY;  Surgeon: [name], MD;  Location: [place];  Service: General;  Laterality: Bilateral;    FAMILY HISTORY: Family History  Problem Relation Age of Onset   Hypertension Mother    Diabetes Mother    Cancer Mother 55       unknown primary   Other Mother        brain tumor; dx 3s   Non-Hodgkin's lymphoma Brother 38   Other Maternal Uncle 17       brain tumor   Breast cancer Other        MGM's niece; dx late 32s   Pancreatic cancer Neg Hx    Colon cancer Neg Hx    Endometrial cancer Neg Hx    Prostate cancer Neg Hx    Ovarian cancer Neg Hx    She has no information on her father. Her mother died at age 65 from stage IV cancer. The primary cancer was unknown, but it was not breast.  Sheryl Mcmillan has two half brothers (through her mother), one of which was diagnosed with non-Hodgkin's lymphoma. There is no family history of breast, ovarian, or prostate cancer to her knowledge. // Patient is BRCA1 positive. Daughter tested negative.   GYNECOLOGIC HISTORY:  Patient's last menstrual period was 09/29/2020 (approximate). Menarche: 45 years old Age at first  live birth: 45 years old [place] P 1 LMP 05/02/2020 Contraceptive: never used HRT n/a  Hysterectomy? no BSO? 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 She is single. Her significant other [name] is a Freight forwarder. She lives at home with [name], her daughter [name],  [name]'s daughter [name] and her 27-monthold daughter [name] Daughter [name] age 44 is a bChief Operating Officerat [employer] in [place] [employer]    ADVANCED DIRECTIVES: Completed and notarized 09/07/2020.  She has named her aunt TMarveen Reeksas healthcare power of attorney   HEALTH MAINTENANCE: Social History   Tobacco Use   Smoking status: Former    Packs/day: 0.50    Years: 10.00    Pack years: 5.00    Types: Cigarettes    Quit date: 08/23/2020    Years since quitting: 0.8   Smokeless tobacco: Never  Vaping Use   Vaping Use: Never used  Substance Use Topics   Alcohol use: Yes    Comment: rare   Drug use: Yes    Types: Marijuana    Comment: 5-7 days/week     Colonoscopy: 2018  PAP: 2018  Bone density: n/a (age)   Allergies  Allergen Reactions   Carboplatin  Hives and Itching    Carboplatin reaction after dose #5 requiring Benadryl, Solumedrol, epinephrine, Claritin.   Other Hives    Baking Soda   Wound Dressing Adhesive Hives    Rips skin, prefers paper tape    Current Outpatient Medications  Medication Sig Dispense Refill   acetaminophen (TYLENOL) 650 MG CR tablet Take 650-1,300 mg by mouth every 8 (eight) hours as needed for pain.     aspirin-acetaminophen-caffeine (EXCEDRIN MIGRAINE) 250-250-65 MG tablet Take 2 tablets by mouth every 6 (six) hours as needed for headache.     BINAXNOW COVID-19 AG HOME TEST KIT TEST AS DIRECTED TODAY     COLLAGEN PO Take 1 tablet by mouth daily.     Multiple Vitamins-Minerals (ONE-A-DAY WOMENS PO) Take 1 tablet by mouth daily.     NON FORMULARY Apply 1  application topically 2 (two) times daily. Skinuva scar cream     omeprazole (PRILOSEC) 40 MG capsule Take 40 mg by mouth daily as needed (acid reflux).     venlafaxine XR (EFFEXOR-XR) 37.5 MG 24 hr capsule Take 1 capsule (37.5 mg total) by mouth daily with breakfast. 90 capsule 4   No current facility-administered medications for this visit.    OBJECTIVE: White woman who appears stated age There were no vitals filed for this visit.  There is no height or weight on file to calculate BMI. There were no vitals filed for this visit.  Sclerae unicteric, EOMs intact Wearing a mask No cervical or supraclavicular adenopathy Lungs no rales or rhonchi Heart regular rate and rhythm Abd soft, nontender, positive bowel sounds MSK no focal spinal tenderness, no upper extremity lymphedema Neuro: nonfocal, well oriented, appropriate affect Breasts: Status post bilateral mastectomies with bilateral reconstruction.  She has detachable nipples in place, which she obtains through [place] she tells me.  There is no evidence of residual or recurrent disease.  Both axillae are benign.   LAB RESULTS:  CMP     Component Value Date/Time   NA 142 05/31/2021 0951   K 4.5 05/31/2021 0951   CL 109 05/31/2021 0951   CO2 21 (L) 05/31/2021 0951   GLUCOSE 136 (H) 05/31/2021 0951   BUN 10 05/31/2021 0951   CREATININE 0.85 05/31/2021 0951   CALCIUM 9.1 05/31/2021 0951   PROT 7.1 05/31/2021 0951   ALBUMIN 3.8 05/31/2021 0951   AST 30 05/31/2021 0951   ALT 38 05/31/2021 0951   ALKPHOS 97 05/31/2021 0951   BILITOT 0.2 (L) 05/31/2021 0951   GFRNONAA >60 05/31/2021 0951   GFRAA >60 06/06/2019 1205    No results found for: TOTALPROTELP, ALBUMINELP, A1GS, A2GS, BETS, BETA2SER, GAMS, MSPIKE, SPEI  Lab Results  Component Value Date   WBC 9.5 05/31/2021   NEUTROABS 5.8 05/31/2021   HGB 13.3 05/31/2021   HCT 41.0 05/31/2021   MCV 83.3 05/31/2021   PLT 332 05/31/2021    No results found for: LABCA2  No  components found for: PIRJJO841  No results for input(s): INR in the last 168 hours.  No results found for: LABCA2  No results found for: CAN199  Lab Results  Component Value Date   CAN125 7.4 12/15/2020    No results found for: YSA630  No results found for: CA2729  No components found for: HGQUANT  No results found for: CEA1 / No results found for: CEA1   No results found for: AFPTUMOR  No results found for: CHROMOGRNA  No results found for: KPAFRELGTCHN, LAMBDASER, KAPLAMBRATIO (kappa/lambda light chains)  No  results found for: HGBA, HGBA2QUANT, HGBFQUANT, HGBSQUAN (Hemoglobinopathy evaluation)   No results found for: LDH  No results found for: IRON, TIBC, IRONPCTSAT (Iron and TIBC)  No results found for: FERRITIN  Urinalysis    Component Value Date/Time   COLORURINE YELLOW 06/06/2019 [place] 06/06/2019 1205   LABSPEC >1.030 (H) 06/06/2019 1205   PHURINE 5.5 06/06/2019 1205   GLUCOSEU NEGATIVE 06/06/2019 1205   HGBUR TRACE (A) 06/06/2019 1205   BILIRUBINUR NEGATIVE 06/06/2019 [place] 06/06/2019 [place] 06/06/2019 1205   UROBILINOGEN 1.0 05/09/2011 1805   NITRITE NEGATIVE 06/06/2019 1205   LEUKOCYTESUR NEGATIVE 06/06/2019 1205    STUDIES: ECHOCARDIOGRAM COMPLETE  Result Date: 06/08/2021    ECHOCARDIOGRAM REPORT   Patient Name:   ROSALI AUGELLO Date of Exam: 06/08/2021 Medical Rec #:  [id number]       Height:       71.0 in Accession #:    [id number]      Weight:       316.1 lb Date of Birth:  [birth year]        BSA:          2.561 m Patient Age:    45 years        BP:           117/83 mmHg Patient Gender: F               HR:           73 bpm. Exam Location:  Outpatient Procedure: 2D Echo, Cardiac Doppler, Color Doppler, Intracardiac Opacification            Agent and Strain Analysis Indications:    Chemo Z09  History:        Patient has prior history of Echocardiogram examinations, most                  recent 04/03/2021.  Sonographer:    Bernadene Person RDCS Referring Phys: [place] Comments: Image acquisition challenging due to breast implants. IMPRESSIONS  1. Left ventricular ejection fraction, by estimation, is 55 to 60%. The left ventricle has normal function. The left ventricle has no regional wall motion abnormalities. There is mild concentric left ventricular hypertrophy. Left ventricular diastolic parameters were normal.  2. Right ventricular systolic function is normal. The right ventricular size is normal. There is normal pulmonary artery systolic pressure.  3. The mitral valve is grossly normal. Trivial mitral valve regurgitation.  4. The aortic valve is tricuspid. Aortic valve regurgitation is not visualized. FINDINGS  Left Ventricle: Left ventricular ejection fraction, by estimation, is 55 to 60%. The left ventricle has normal function. The left ventricle has no regional wall motion abnormalities. Definity contrast agent was given IV to delineate the left ventricular  endocardial borders. Global longitudinal strain performed but not reported based on interpreter judgement due to suboptimal tracking. The global longitudinal strain is normal despite suboptimal segment tracking. The left ventricular internal cavity size  was normal in size. There is mild concentric left ventricular hypertrophy. Left ventricular diastolic parameters were normal. Right Ventricle: The right ventricular size is normal. Right vetricular wall thickness was not well visualized. Right ventricular systolic function is normal. There is normal pulmonary artery systolic pressure. The tricuspid regurgitant velocity is 1.96 m/s, and with an assumed right atrial pressure of 8 mmHg, the estimated right ventricular systolic pressure is 03.2 mmHg. Left Atrium: Left atrial size was normal in  size. Right Atrium: Right atrial size was normal in size. Pericardium: There is no evidence of pericardial effusion. Mitral  Valve: The mitral valve is grossly normal. Trivial mitral valve regurgitation. Tricuspid Valve: The tricuspid valve is normal in structure. Tricuspid valve regurgitation is trivial. Aortic Valve: The aortic valve is tricuspid. Aortic valve regurgitation is not visualized. Pulmonic Valve: The pulmonic valve was grossly normal. Pulmonic valve regurgitation is not visualized. Aorta: The aortic root and ascending aorta are structurally normal, with no evidence of dilitation. IAS/Shunts: The interatrial septum was not well visualized.  LEFT VENTRICLE PLAX 2D LVIDd:         5.20 cm   Diastology LVIDs:         3.70 cm   LV e' medial:    8.40 cm/s LV PW:         1.20 cm   LV E/e' medial:  11.1 LV IVS:        1.20 cm   LV e' lateral:   10.10 cm/s LVOT diam:     2.10 cm   LV E/e' lateral: 9.3 LV SV:         96 LV SV Index:   38 LVOT Area:     3.46 cm  RIGHT VENTRICLE RV S prime:     9.83 cm/s TAPSE (M-mode): 2.4 cm LEFT ATRIUM             Index        RIGHT ATRIUM           Index LA diam:        3.90 cm 1.52 cm/m   RA Area:     21.00 cm LA Vol (A2C):   63.2 ml 24.67 ml/m  RA Volume:   56.70 ml  22.14 ml/m LA Vol (A4C):   74.1 ml 28.93 ml/m LA Biplane Vol: 69.2 ml 27.02 ml/m  AORTIC VALVE LVOT Vmax:   136.00 cm/s LVOT Vmean:  87.300 cm/s LVOT VTI:    0.278 m  AORTA Ao Root diam: 3.70 cm Ao Asc diam:  3.70 cm MITRAL VALVE               TRICUSPID VALVE MV Area (PHT): 3.54 cm    TR Peak grad:   15.4 mmHg MV Decel Time: 214 msec    TR Vmax:        196.00 cm/s MV E velocity: 93.60 cm/s MV A velocity: 78.30 cm/s  SHUNTS MV E/A ratio:  1.20        Systemic VTI:  0.28 m                            Systemic Diam: 2.10 cm Mertie Moores MD Electronically signed by Mertie Moores MD Signature Date/Time: 06/08/2021/2:37:55 PM    Final      ELIGIBLE FOR AVAILABLE RESEARCH PROTOCOL: AET  ASSESSMENT: 45 y.o. BRCA positive [place] woman status post left breast upper outer quadrant biopsy 05/18/2020 for a clinical T2N0, stage IIA  invasive ductal carcinoma, grade 3, estrogen receptor weakly positive, progesterone receptor negative, with an MIB-1 of 50%, and HER-2 positive by immunohistochemistry  (1) genetics testing 06/16/2020 shows a pathogenic mutation in BRCA1 at c.2475del (p.Asp825Glufs*21)  (a)  Variant of uncertain significance in MSH3 at c.2724A>G (Silent).  (b) no additional deleterious mutations through the Multi-Cancer Panel offered by [facility] including AIP, ALK, APC, ATM, AXIN2,BAP1,  BARD1, BLM, BMPR1A, BRCA1, BRCA2, BRIP1, CASR, CDC73, CDH1, CDK4, CDKN1B,  CDKN1C, CDKN2A (p14ARF), CDKN2A (p16INK4a), CEBPA, CHEK2, CTNNA1, DICER1, DIS3L2, EGFR (c.2369C>T, p.Thr790Met variant only), EPCAM (Deletion/duplication testing only), FH, FLCN, GATA2, GPC3, GREM1 (Promoter region deletion/duplication testing only), HOXB13 (c.251G>A, p.Gly84Glu), HRAS, KIT, MAX, MEN1, MET, MITF (c.952G>A, p.Glu318Lys variant only), MLH1, MSH2, MSH3, MSH6, MUTYH, NBN, NF1, NF2, NTHL1, PALB2, PDGFRA, PHOX2B, PMS2, POLD1, POLE, POT1, PRKAR1A, PTCH1, PTEN, RAD50, RAD51C, RAD51D, RB1, RECQL4, RET, RNF43, RUNX1, SDHAF2, SDHA (sequence changes only), SDHB, SDHC, SDHD, SMAD4, SMARCA4, SMARCB1, SMARCE1, STK11, SUFU, TERC, TERT, TMEM127, TP53, TSC1, TSC2, VHL, WRN and WT1.   (2) neoadjuvant chemotherapy to consist of carboplatin and docetaxel together with trastuzumab and Pertuzumab every 3 weeks x 6, starting 06/16/2020, completed 09/28/2020   (a) pertuzumab omitted after cycle 1  (3) trastuzumab to be continued to complete 1 year (through December 2022)  (a) echo 06/09/2020 shows EF in the 60-65% range  (b) echo 09/14/2020 shows an ejection fraction in 65-70% range.  (c) echo 01/18/2021 showed an ejection fraction in the 60-65 % range  (d) echo 04/03/2021 showed an ejection fraction in the 55 to 60% range.  (4) status post bilateral mastectomies 11/07/2020 showing  (A) on the right, no evidence of malignancy  (B) on the left, residual ypT1c ypN0  invasive ductal carcinoma, grade 3, with negative margins, now triple negative, with an MIB-1 of 15%.  (5) bilateral salpingo-oophorectomy and hysterectomy with incidental cholecystectomy 03/14/2021 showed benign pathology   (6) olaparib was discussed but is not approved for HER-2 positive patients: the OPHELIA trial exploring this has closed due to poor enrollment.  Studies exploring olaparib and HER2 positive disease available in [place] currently require either newly diagnosed inflammatory breast cancer or recurrent/metastatic disease as entry criteria    PLAN:  Total encounter time 20 minutes.*   *Total Encounter Time as defined by the Centers for Medicare and Medicaid Services includes, in addition to the face-to-face time of a patient visit (documented in the note above) non-face-to-face time: obtaining and reviewing outside history, ordering and reviewing medications, tests or procedures, care coordination (communications with other health care professionals or caregivers) and documentation in the medical record.

## 2021-06-21 ENCOUNTER — Telehealth: Payer: Self-pay

## 2021-06-21 ENCOUNTER — Inpatient Hospital Stay: Payer: Medicaid Other

## 2021-06-21 ENCOUNTER — Encounter: Payer: Self-pay | Admitting: Hematology and Oncology

## 2021-06-21 ENCOUNTER — Inpatient Hospital Stay (HOSPITAL_BASED_OUTPATIENT_CLINIC_OR_DEPARTMENT_OTHER): Payer: Medicaid Other | Admitting: Hematology and Oncology

## 2021-06-21 ENCOUNTER — Other Ambulatory Visit: Payer: Self-pay

## 2021-06-21 VITALS — BP 128/97 | HR 75 | Temp 98.1°F | Resp 18 | Wt 319.3 lb

## 2021-06-21 DIAGNOSIS — G620 Drug-induced polyneuropathy: Secondary | ICD-10-CM

## 2021-06-21 DIAGNOSIS — Z1509 Genetic susceptibility to other malignant neoplasm: Secondary | ICD-10-CM

## 2021-06-21 DIAGNOSIS — C50412 Malignant neoplasm of upper-outer quadrant of left female breast: Secondary | ICD-10-CM

## 2021-06-21 DIAGNOSIS — Z170 Estrogen receptor positive status [ER+]: Secondary | ICD-10-CM | POA: Diagnosis not present

## 2021-06-21 DIAGNOSIS — T451X5A Adverse effect of antineoplastic and immunosuppressive drugs, initial encounter: Secondary | ICD-10-CM

## 2021-06-21 DIAGNOSIS — Z1501 Genetic susceptibility to malignant neoplasm of breast: Secondary | ICD-10-CM | POA: Diagnosis not present

## 2021-06-21 DIAGNOSIS — Z5112 Encounter for antineoplastic immunotherapy: Secondary | ICD-10-CM | POA: Diagnosis not present

## 2021-06-21 LAB — CBC WITH DIFFERENTIAL (CANCER CENTER ONLY)
Abs Immature Granulocytes: 0.03 10*3/uL (ref 0.00–0.07)
Basophils Absolute: 0 10*3/uL (ref 0.0–0.1)
Basophils Relative: 0 %
Eosinophils Absolute: 0.2 10*3/uL (ref 0.0–0.5)
Eosinophils Relative: 2 %
HCT: 38.9 % (ref 36.0–46.0)
Hemoglobin: 12.4 g/dL (ref 12.0–15.0)
Immature Granulocytes: 0 %
Lymphocytes Relative: 34 %
Lymphs Abs: 2.8 10*3/uL (ref 0.7–4.0)
MCH: 26.8 pg (ref 26.0–34.0)
MCHC: 31.9 g/dL (ref 30.0–36.0)
MCV: 84.2 fL (ref 80.0–100.0)
Monocytes Absolute: 0.5 10*3/uL (ref 0.1–1.0)
Monocytes Relative: 6 %
Neutro Abs: 4.7 10*3/uL (ref 1.7–7.7)
Neutrophils Relative %: 58 %
Platelet Count: 320 10*3/uL (ref 150–400)
RBC: 4.62 MIL/uL (ref 3.87–5.11)
RDW: 15.1 % (ref 11.5–15.5)
WBC Count: 8.2 10*3/uL (ref 4.0–10.5)
nRBC: 0 % (ref 0.0–0.2)

## 2021-06-21 LAB — CMP (CANCER CENTER ONLY)
ALT: 24 U/L (ref 0–44)
AST: 16 U/L (ref 15–41)
Albumin: 3.8 g/dL (ref 3.5–5.0)
Alkaline Phosphatase: 88 U/L (ref 38–126)
Anion gap: 7 (ref 5–15)
BUN: 12 mg/dL (ref 6–20)
CO2: 23 mmol/L (ref 22–32)
Calcium: 8.9 mg/dL (ref 8.9–10.3)
Chloride: 110 mmol/L (ref 98–111)
Creatinine: 0.71 mg/dL (ref 0.44–1.00)
GFR, Estimated: >60 mL/min (ref >60)
Glucose, Bld: 136 mg/dL — ABNORMAL HIGH (ref 70–99)
Potassium: 4.1 mmol/L (ref 3.5–5.1)
Sodium: 140 mmol/L (ref 135–145)
Total Bilirubin: 0.2 mg/dL — ABNORMAL LOW (ref 0.3–1.2)
Total Protein: 6.6 g/dL (ref 6.5–8.1)

## 2021-06-21 MED ORDER — TAMOXIFEN CITRATE 20 MG PO TABS: 20.0000 mg | ORAL_TABLET | Freq: Every day | ORAL | 3 refills | Status: DC

## 2021-06-21 MED ORDER — OLAPARIB 100 MG PO TABS
300.0000 mg | ORAL_TABLET | Freq: Two times a day (BID) | ORAL | 3 refills | Status: DC
  Filled 2021-06-21: qty 168, 28d supply, fill #0

## 2021-06-21 MED ORDER — OLAPARIB 100 MG PO TABS: 300.0000 mg | ORAL_TABLET | Freq: Two times a day (BID) | ORAL | 3 refills | Status: DC

## 2021-06-21 NOTE — Progress Notes (Addendum)
[facility]  Telephone:([phone]) [phone] Fax:(336) [phone]    ID: Sheryl Mcmillan DOB: 1976/01/24  MR#: [id number]  FTD#:[id number]  Patient Care Team: Sheryl Hummingbird, PA-C as PCP - General (Physician Assistant) Sheryl Kaufmann, RN as Oncology Nurse Navigator Sheryl Germany, RN as Oncology Nurse Navigator Sheryl Bookbinder, MD as Consulting Physician (General Surgery) Magrinat, [name], MD as Consulting Physician (Oncology) Sheryl Pray, MD as Consulting Physician (Radiation Oncology) Sheryl Presume, MD as Consulting Physician (Plastic Surgery) Sheryl Pike, MD OTHER MD:  CHIEF COMPLAINT: estrogen receptor negative, 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  Her feet bother her more especially at nighttime.  She mentions that in the past she was screened for neuropathy study which she did not qualify for but she is wondering if she can rescreen for the study since her neuropathy is worse.  Besides that she denies any chest pain, shortness of breath or leg swelling.  She is also wondering about role of antiestrogen therapy given weak staining.  Rest of the pertinent 10 point ROS reviewed and negative  REVIEW OF SYSTEMS:  A detailed review of systems today was otherwise stable.    COVID 19 VACCINATION STATUS: Status post vaccine x2, no booster as of February 2022   HISTORY OF CURRENT ILLNESS: From the original intake note:   Sheryl Mcmillan presented with a palpable lump in the outer left breast, which she initially felt approximately 6 months prior. She waited to seek  medical attention because she had no insurance.  Eventually she enrolled in the [facility] program and underwent bilateral diagnostic mammography with tomography and left breast ultrasonography at [facility] on 05/10/2020 showing: breast density category B; vaguely palpable 2.5 cm mass in left breast at 1:30; no pathologic left axillary lymphadenopathy or evidence of right breast malignancy.  Accordingly on 05/18/2020 she proceeded to biopsy of the left breast area in question. The pathology from this procedure ([id number]) showed: invasive ductal carcinoma, grade 3. Prognostic indicators significant for: estrogen receptor, 40% positive with weak staining intensity and progesterone receptor, 0% negative. Proliferation marker Ki67 at 50%. HER2 positive by immunohistochemistry (3+).  The patient's subsequent history is as detailed below.   PAST MEDICAL HISTORY: Past Medical History:  Diagnosis Date   Arthritis    Breast cancer ([place])    Family history of non-Hodgkin's lymphoma 06/01/2020   GERD (gastroesophageal reflux disease)    Hemorrhoids    Migraines    PCOS (polycystic ovarian syndrome)    PONV (postoperative nausea and vomiting)     PAST SURGICAL HISTORY: Past Surgical History:  Procedure Laterality Date   AXILLARY SENTINEL NODE BIOPSY Left 11/07/2020   Procedure: LEFT AXILLARY SENTINEL NODE BIOPSY;  Surgeon: Sheryl Bookbinder, MD;  Location: [facility];  Service: General;  Laterality: Left;   BREAST RECONSTRUCTION WITH PLACEMENT OF TISSUE EXPANDER AND FLEX HD (ACELLULAR HYDRATED DERMIS) Bilateral 11/07/2020   Procedure: BILATERAL BREAST RECONSTRUCTION WITH PLACEMENT OF TISSUE EXPANDER AND FLEX HD (ACELLULAR HYDRATED DERMIS);  Surgeon: Sheryl Presume, MD;  Location: [facility];  Service: Plastics;  Laterality: Bilateral;   COSMETIC SURGERY     DILATION  AND CURETTAGE OF UTERUS     HEMORRHOID SURGERY     IR IMAGING GUIDED PORT INSERTION  06/15/2020   MASS EXCISION Bilateral 04/25/2021    Procedure: excision of bilateral axillary breast tissue;  Surgeon: Sheryl Presume, MD;  Location: [facility];  Service: Plastics;  Laterality: Bilateral;   PORT-A-CATH REMOVAL Right 11/07/2020   Procedure: REMOVAL PORT-A-CATH;  Surgeon: Sheryl Bookbinder, MD;  Location: [facility];  Service: General;  Laterality: Right;   REMOVAL OF BILATERAL TISSUE EXPANDERS WITH PLACEMENT OF BILATERAL BREAST IMPLANTS Bilateral 04/25/2021   Procedure: REMOVAL OF BILATERAL TISSUE EXPANDERS WITH PLACEMENT OF BILATERAL BREAST IMPLANTS;  Surgeon: Sheryl Presume, MD;  Location: [facility];  Service: Plastics;  Laterality: Bilateral;   ROBOTIC ASSISTED TOTAL HYSTERECTOMY WITH BILATERAL SALPINGO OOPHERECTOMY Bilateral 03/14/2021   Procedure: XI ROBOTIC ASSISTED TOTAL HYSTERECTOMY WITH BILATERAL SALPINGO OOPHORECTOMY;  Surgeon: Sheryl Amber, MD;  Location: [facility];  Service: Gynecology;  Laterality: Bilateral;   TOTAL MASTECTOMY Bilateral 11/07/2020   Procedure: BILATERAL MASTECTOMY;  Surgeon: Sheryl Bookbinder, MD;  Location: [facility];  Service: General;  Laterality: Bilateral;    FAMILY HISTORY: Family History  Problem Relation Age of Onset   Hypertension Mother    Diabetes Mother    Cancer Mother 74       unknown primary   Other Mother        brain tumor; dx 96s   Non-Hodgkin's lymphoma Brother 61   Other Maternal Uncle 82       brain tumor   Breast cancer Other        MGM's niece; dx late 69s   Pancreatic cancer Neg Hx    Colon cancer Neg Hx    Endometrial cancer Neg Hx    Prostate cancer Neg Hx    Ovarian cancer Neg Hx    She has no information on her father. Her mother died at age 9 from stage IV cancer. The primary cancer was unknown, but it was not breast.  Tammala has two half brothers (through her mother), one of which was diagnosed with non-Hodgkin's lymphoma. There is no family history of breast, ovarian, or prostate cancer to her knowledge. // Patient is BRCA1 positive. Daughter  tested negative.   GYNECOLOGIC HISTORY:  Patient's last menstrual period was 09/29/2020 (approximate). Menarche: 45 years old Age at first live birth: 45 years old [place] P 1 LMP 05/02/2020 Contraceptive: never used HRT n/a  Hysterectomy? no BSO? 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 She is single. Her significant other [name] is a Freight forwarder. She lives at home with [name], her daughter [name],  [name]'s daughter [name] and her 79-monthold daughter [name] Daughter [name] age 44 is a bChief Operating Officerat [employer] [employer]    ADVANCED DIRECTIVES: Completed and notarized 09/07/2020.  She has named her aunt TMarveen Reeksas healthcare power of attorney   HEALTH MAINTENANCE: Social History   Tobacco Use   Smoking status: Former    Packs/day: 0.50    Years: 10.00    Pack years: 5.00    Types: Cigarettes    Quit date: 08/23/2020    Years since quitting: 0.8   Smokeless tobacco: Never  Vaping Use   Vaping Use: Never used  Substance Use Topics   Alcohol use: Yes    Comment: rare   Drug use: Yes    Types: Marijuana  Comment: 5-7 days/week     Colonoscopy: 2018  PAP: 2018  Bone density: n/a (age)   Allergies  Allergen Reactions   Carboplatin Hives and Itching    Carboplatin reaction after dose #5 requiring Benadryl, Solumedrol, epinephrine, Claritin.   Other Hives    Baking Soda   Wound Dressing Adhesive Hives    Rips skin, prefers paper tape    Current Outpatient Medications  Medication Sig Dispense Refill   acetaminophen (TYLENOL) 650 MG CR tablet Take 650-1,300 mg by mouth every 8 (eight) hours as needed for pain.     aspirin-acetaminophen-caffeine (EXCEDRIN MIGRAINE) 250-250-65 MG tablet Take 2 tablets by mouth every 6 (six) hours as needed for headache.     BINAXNOW COVID-19 AG HOME TEST KIT TEST AS DIRECTED TODAY     COLLAGEN PO  Take 1 tablet by mouth daily.     Multiple Vitamins-Minerals (ONE-A-DAY WOMENS PO) Take 1 tablet by mouth daily.     NON FORMULARY Apply 1 application topically 2 (two) times daily. Skinuva scar cream     omeprazole (PRILOSEC) 40 MG capsule Take 40 mg by mouth daily as needed (acid reflux).     venlafaxine XR (EFFEXOR-XR) 37.5 MG 24 hr capsule Take 1 capsule (37.5 mg total) by mouth daily with breakfast. 90 capsule 4   No current facility-administered medications for this visit.    OBJECTIVE: White woman who appears stated age 76:   06/21/21 0910  BP: (!) 128/97  Pulse: 75  Resp: 18  Temp: 98.1 F (36.7 C)  SpO2: 98%   Body mass index is 44.54 kg/m. Filed Weights   06/21/21 0910  Weight: (!) 319 lb 5 oz (144.8 kg)   Sclerae unicteric, EOMs intact Wearing a mask No cervical or supraclavicular adenopathy Lungs no rales or rhonchi Heart regular rate and rhythm Abd soft, nontender, positive bowel sounds MSK no focal spinal tenderness, no upper extremity lymphedema Neuro: nonfocal, well oriented, appropriate affect Breasts: Status post bilateral mastectomies with bilateral reconstruction.  She has detachable nipples in place, which she obtains through [place] she tells me.  There is no evidence of residual or recurrent disease.  Both axillae are benign.   LAB RESULTS:  CMP     Component Value Date/Time   NA 140 06/21/2021 0855   K 4.1 06/21/2021 0855   CL 110 06/21/2021 0855   CO2 23 06/21/2021 0855   GLUCOSE 136 (H) 06/21/2021 0855   BUN 12 06/21/2021 0855   CREATININE 0.71 06/21/2021 0855   CALCIUM 8.9 06/21/2021 0855   PROT 6.6 06/21/2021 0855   ALBUMIN 3.8 06/21/2021 0855   AST 16 06/21/2021 0855   ALT 24 06/21/2021 0855   ALKPHOS 88 06/21/2021 0855   BILITOT 0.2 (L) 06/21/2021 0855   GFRNONAA >60 06/21/2021 0855   GFRAA >60 06/06/2019 1205    No results found for: TOTALPROTELP, ALBUMINELP, A1GS, A2GS, BETS, BETA2SER, GAMS, MSPIKE, SPEI  Lab Results   Component Value Date   WBC 8.2 06/21/2021   NEUTROABS 4.7 06/21/2021   HGB 12.4 06/21/2021   HCT 38.9 06/21/2021   MCV 84.2 06/21/2021   PLT 320 06/21/2021    No results found for: LABCA2  No components found for: CBULAG536  No results for input(s): INR in the last 168 hours.  No results found for: LABCA2  No results found for: CAN199  Lab Results  Component Value Date   CAN125 7.4 12/15/2020    No results found for: IWO032  No results found for:  AO1308  No components found for: HGQUANT  No results found for: CEA1 / No results found for: CEA1   No results found for: AFPTUMOR  No results found for: CHROMOGRNA  No results found for: KPAFRELGTCHN, LAMBDASER, KAPLAMBRATIO (kappa/lambda light chains)  No results found for: HGBA, HGBA2QUANT, HGBFQUANT, HGBSQUAN (Hemoglobinopathy evaluation)   No results found for: LDH  No results found for: IRON, TIBC, IRONPCTSAT (Iron and TIBC)  No results found for: FERRITIN  Urinalysis    Component Value Date/Time   COLORURINE YELLOW 06/06/2019 [place] 06/06/2019 1205   LABSPEC >1.030 (H) 06/06/2019 1205   PHURINE 5.5 06/06/2019 1205   GLUCOSEU NEGATIVE 06/06/2019 1205   HGBUR TRACE (A) 06/06/2019 1205   BILIRUBINUR NEGATIVE 06/06/2019 [place] 06/06/2019 [place] 06/06/2019 1205   UROBILINOGEN 1.0 05/09/2011 1805   NITRITE NEGATIVE 06/06/2019 1205   LEUKOCYTESUR NEGATIVE 06/06/2019 1205    STUDIES: ECHOCARDIOGRAM COMPLETE  Result Date: 06/08/2021    ECHOCARDIOGRAM REPORT   Patient Name:   Sheryl Mcmillan Date of Exam: 06/08/2021 Medical Rec #:  [id number]       Height:       71.0 in Accession #:    [id number]      Weight:       316.1 lb Date of Birth:  25-Oct-1975        BSA:          2.561 m Patient Age:    53 years        BP:           117/83 mmHg Patient Gender: F               HR:           73 bpm. Exam Location:  Outpatient Procedure: 2D Echo, Cardiac Doppler,  Color Doppler, Intracardiac Opacification            Agent and Strain Analysis Indications:    Chemo Z09  History:        Patient has prior history of Echocardiogram examinations, most                 recent 04/03/2021.  Sonographer:    Bernadene Person RDCS Referring Phys: [place] Comments: Image acquisition challenging due to breast implants. IMPRESSIONS  1. Left ventricular ejection fraction, by estimation, is 55 to 60%. The left ventricle has normal function. The left ventricle has no regional wall motion abnormalities. There is mild concentric left ventricular hypertrophy. Left ventricular diastolic parameters were normal.  2. Right ventricular systolic function is normal. The right ventricular size is normal. There is normal pulmonary artery systolic pressure.  3. The mitral valve is grossly normal. Trivial mitral valve regurgitation.  4. The aortic valve is tricuspid. Aortic valve regurgitation is not visualized. FINDINGS  Left Ventricle: Left ventricular ejection fraction, by estimation, is 55 to 60%. The left ventricle has normal function. The left ventricle has no regional wall motion abnormalities. Definity contrast agent was given IV to delineate the left ventricular  endocardial borders. Global longitudinal strain performed but not reported based on interpreter judgement due to suboptimal tracking. The global longitudinal strain is normal despite suboptimal segment tracking. The left ventricular internal cavity size  was normal in size. There is mild concentric left ventricular hypertrophy. Left ventricular diastolic parameters were normal. Right Ventricle: The right ventricular size is normal. Right vetricular wall thickness was not well visualized. Right ventricular  systolic function is normal. There is normal pulmonary artery systolic pressure. The tricuspid regurgitant velocity is 1.96 m/s, and with an assumed right atrial pressure of 8 mmHg, the estimated right ventricular  systolic pressure is 89.3 mmHg. Left Atrium: Left atrial size was normal in size. Right Atrium: Right atrial size was normal in size. Pericardium: There is no evidence of pericardial effusion. Mitral Valve: The mitral valve is grossly normal. Trivial mitral valve regurgitation. Tricuspid Valve: The tricuspid valve is normal in structure. Tricuspid valve regurgitation is trivial. Aortic Valve: The aortic valve is tricuspid. Aortic valve regurgitation is not visualized. Pulmonic Valve: The pulmonic valve was grossly normal. Pulmonic valve regurgitation is not visualized. Aorta: The aortic root and ascending aorta are structurally normal, with no evidence of dilitation. IAS/Shunts: The interatrial septum was not well visualized.  LEFT VENTRICLE PLAX 2D LVIDd:         5.20 cm   Diastology LVIDs:         3.70 cm   LV e' medial:    8.40 cm/s LV PW:         1.20 cm   LV E/e' medial:  11.1 LV IVS:        1.20 cm   LV e' lateral:   10.10 cm/s LVOT diam:     2.10 cm   LV E/e' lateral: 9.3 LV SV:         96 LV SV Index:   38 LVOT Area:     3.46 cm  RIGHT VENTRICLE RV S prime:     9.83 cm/s TAPSE (M-mode): 2.4 cm LEFT ATRIUM             Index        RIGHT ATRIUM           Index LA diam:        3.90 cm 1.52 cm/m   RA Area:     21.00 cm LA Vol (A2C):   63.2 ml 24.67 ml/m  RA Volume:   56.70 ml  22.14 ml/m LA Vol (A4C):   74.1 ml 28.93 ml/m LA Biplane Vol: 69.2 ml 27.02 ml/m  AORTIC VALVE LVOT Vmax:   136.00 cm/s LVOT Vmean:  87.300 cm/s LVOT VTI:    0.278 m  AORTA Ao Root diam: 3.70 cm Ao Asc diam:  3.70 cm MITRAL VALVE               TRICUSPID VALVE MV Area (PHT): 3.54 cm    TR Peak grad:   15.4 mmHg MV Decel Time: 214 msec    TR Vmax:        196.00 cm/s MV E velocity: 93.60 cm/s MV A velocity: 78.30 cm/s  SHUNTS MV E/A ratio:  1.20        Systemic VTI:  0.28 m                            Systemic Diam: 2.10 cm Mertie Moores MD Electronically signed by Mertie Moores MD Signature Date/Time: 06/08/2021/2:37:55 PM    Final       ELIGIBLE FOR AVAILABLE RESEARCH PROTOCOL: AET  ASSESSMENT: 45 y.o. BRCA positive [place] woman status post left breast upper outer quadrant biopsy 05/18/2020 for a clinical T2N0, stage IIA invasive ductal carcinoma, grade 3, estrogen receptor weakly positive, progesterone receptor negative, with an MIB-1 of 50%, and HER-2 positive by immunohistochemistry  (1) genetics testing 06/16/2020 shows a pathogenic mutation in BRCA1 at  c.2475del (p.Asp825Glufs*21)  (a)  Variant of uncertain significance in MSH3 at c.2724A>G (Silent).  (b) no additional deleterious mutations through the Multi-Cancer Panel offered by [facility] including AIP, ALK, APC, ATM, AXIN2,BAP1,  BARD1, BLM, BMPR1A, BRCA1, BRCA2, BRIP1, CASR, CDC73, CDH1, CDK4, CDKN1B, CDKN1C, CDKN2A (p14ARF), CDKN2A (p16INK4a), CEBPA, CHEK2, CTNNA1, DICER1, DIS3L2, EGFR (c.2369C>T, p.Thr790Met variant only), EPCAM (Deletion/duplication testing only), FH, FLCN, GATA2, GPC3, GREM1 (Promoter region deletion/duplication testing only), HOXB13 (c.251G>A, p.Gly84Glu), HRAS, KIT, MAX, MEN1, MET, MITF (c.952G>A, p.Glu318Lys variant only), MLH1, MSH2, MSH3, MSH6, MUTYH, NBN, NF1, NF2, NTHL1, PALB2, PDGFRA, PHOX2B, PMS2, POLD1, POLE, POT1, PRKAR1A, PTCH1, PTEN, RAD50, RAD51C, RAD51D, RB1, RECQL4, RET, RNF43, RUNX1, SDHAF2, SDHA (sequence changes only), SDHB, SDHC, SDHD, SMAD4, SMARCA4, SMARCB1, SMARCE1, STK11, SUFU, TERC, TERT, TMEM127, TP53, TSC1, TSC2, VHL, WRN and WT1.   (2) neoadjuvant chemotherapy to consist of carboplatin and docetaxel together with trastuzumab and Pertuzumab every 3 weeks x 6, starting 06/16/2020, completed 09/28/2020   (a) pertuzumab omitted after cycle 1   (3) trastuzumab to be continued to complete 1 year (through December 2022)  (a) echo 06/09/2020 shows EF in the 60-65% range  (b) echo 09/14/2020 shows an ejection fraction in 65-70% range.  (c) echo 01/18/2021 showed an ejection fraction in the 60-65 % range  (d) echo 04/03/2021  showed an ejection fraction in the 55 to 60% range.  (4) status post bilateral mastectomies 11/07/2020 showing  (A) on the right, no evidence of malignancy  (B) on the left, residual ypT1c ypN0 invasive ductal carcinoma, grade 3, with negative margins, now triple negative, with an MIB-1 of 15%.  I am not entirely sure why she did not receive adjuvant Kadcyla despite residual disease.  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  (5) bilateral salpingo-oophorectomy and hysterectomy with incidental cholecystectomy 03/14/2021 showed benign patholog  PLAN:  Again this is a very pleasant 45 year old female patient who is here before her last planned cycle of adjuvant trastuzumab. From her echo standpoint, there has been no change in the ejection fraction from October's echocardiogram to the most recent echocardiogram and she is clinically asymptomatic hence I believe it is reasonable to finish last planned cycle of trastuzumab today.  We have also discussed about consideration of olaparib since her residual disease after bilateral mastectomy was indeed triple negative.  I have discussed about 300 mg olaparib orally twice daily for 52 weeks if approved.  Of course this is not the classic triple negative adjuvant scenario however since she has BRCA1 mutation and since she had residual triple negative biology, I believe this is a reasonable approach.  I will try to see if this will be approved.  Also I understand the timeline is probably not well studied since she is almost a year out from her diagnosis.  I have explained all of this very clearly to the patient and she is agreeable to trying it. With regards to peripheral neuropathy, we will try to see if she is a candidate for the PEA study.  If she is not a candidate for the study, since she complains of progressive peripheral neuropathy, we can  consider trying gabapentin for symptom management.  Since she was weakly estrogen positive on the initial specimen, I have discussed that she may not derive much benefit from antiestrogen therapy although she could qualify for it.  We have discussed about both tamoxifen and Arimidex as a choice,  discussed adverse effects of tamoxifen as well as Arimidex in detail.  She is not excited about osteoporosis risk with our anastrozole hence she would like to consider tamoxifen.  I have once again discussed that if she does not tolerate this medication well, I would not recommend continuing it since she may have a questionable benefit from this.   She is agreeable to all the recommendations.  Thank you for consulting [place] in the care of this patient.  Please not hesitate to contact us with any additional questions or concerns.  I spent over 45 minutes in the care of this patient, she is a new patient to me transitioning her care from Dr. Jana Hakim upon his retirement.  I have reviewed her prior history, medical records, reviewed about questionable role of tamoxifen/olaparib and echocardiogram results, neuropathy management today. Sheryl Pike MD  ECOG : 1

## 2021-06-21 NOTE — Telephone Encounter (Signed)
Oral Oncology Pharmacist Encounter  Received new prescription for olaparib [facility]) for the adjuvant treatment of triple negative, BRCA positive, early stage breast cancer (previously HER2 positive finishing trastuzumab treatment) in conjunction with tamoxifen, planned duration for 1 year or until disease progression or unacceptable toxicity. Prescription dose and frequency assessed, no interventions needed. Therapy started per the [place] trial.   Labs from 06/21/21 assessed, no interventions needed.  Current medication list in Epic reviewed, DDIs with [place] identified: none  Evaluated chart and no patient barriers to medication adherence noted.   Patient agreement for treatment documented in MD note on 06/21/2021.  Prescription has been e-scribed to the [facility] for benefits analysis and approval.  Oral Oncology Clinic will continue to follow for insurance authorization, copayment issues, initial counseling and start date.  Drema Halon, PharmD Hematology/Oncology Clinical Pharmacist [facility] [phone] 06/21/2021 6:22 PM

## 2021-06-22 ENCOUNTER — Other Ambulatory Visit (HOSPITAL_COMMUNITY): Payer: Self-pay

## 2021-06-22 ENCOUNTER — Telehealth: Payer: Self-pay | Admitting: Pharmacy Technician

## 2021-06-22 ENCOUNTER — Ambulatory Visit: Payer: Medicaid Other

## 2021-06-22 ENCOUNTER — Encounter: Payer: Self-pay | Admitting: *Deleted

## 2021-06-22 ENCOUNTER — Other Ambulatory Visit: Payer: Medicaid Other

## 2021-06-22 DIAGNOSIS — C50412 Malignant neoplasm of upper-outer quadrant of left female breast: Secondary | ICD-10-CM

## 2021-06-22 NOTE — Telephone Encounter (Signed)
Received New start notification for  Lynparza . Will update as we work through the benefits process.   Ran test claim, patient's copay is zero for 28 day supply.

## 2021-06-23 ENCOUNTER — Other Ambulatory Visit: Payer: Self-pay

## 2021-06-23 ENCOUNTER — Other Ambulatory Visit: Payer: Medicaid Other

## 2021-06-23 ENCOUNTER — Inpatient Hospital Stay: Payer: Medicaid Other

## 2021-06-23 ENCOUNTER — Inpatient Hospital Stay: Payer: Medicaid Other | Admitting: Emergency Medicine

## 2021-06-23 ENCOUNTER — Ambulatory Visit: Payer: Medicaid Other | Admitting: Hematology and Oncology

## 2021-06-23 VITALS — BP 133/90 | HR 91 | Temp 98.0°F | Resp 16

## 2021-06-23 DIAGNOSIS — Z170 Estrogen receptor positive status [ER+]: Secondary | ICD-10-CM

## 2021-06-23 DIAGNOSIS — Z1509 Genetic susceptibility to other malignant neoplasm: Secondary | ICD-10-CM

## 2021-06-23 DIAGNOSIS — C50412 Malignant neoplasm of upper-outer quadrant of left female breast: Secondary | ICD-10-CM

## 2021-06-23 DIAGNOSIS — Z5112 Encounter for antineoplastic immunotherapy: Secondary | ICD-10-CM | POA: Diagnosis not present

## 2021-06-23 MED ORDER — TRASTUZUMAB-HYALURONIDASE-OYSK 600-10000 MG-UNT/5ML ~~LOC~~ SOLN
600.0000 mg | Freq: Once | SUBCUTANEOUS | Status: AC
  Administered 2021-06-23: 11:00:00 600 mg via SUBCUTANEOUS
  Filled 2021-06-23: qty 5

## 2021-06-23 NOTE — Patient Instructions (Signed)
[facility] MEDICAL [facility]COLOG[facility]  Discharge Instructions: ?Thank you for choosing [facility] to provide your oncology and hematology care.  ? ?If you have a lab appointment with the Cancer Center, please go directly to the Cancer Center and check in at the registration area. ?  ?Wear comfortable clothing and clothing appropriate for easy access to any Portacath or PICC line.  ? ?We strive to give you quality time with your provider. You may need to reschedule your appointment if you arrive late (15 or more minutes).  Arriving late affects you and other patients whose appointments are after yours.  Also, if you miss three or more appointments without notifying the office, you may be dismissed from the clinic at the provider?s discretion.    ?  ?For prescription refill requests, have your pharmacy contact our office and allow 72 hours for refills to be completed.   ? ?Today you received the following chemotherapy and/or immunotherapy agents: Trastuzumab    ?  ?To help prevent nausea and vomiting after your treatment, we encourage you to take your nausea medication as directed. ? ?BELOW ARE SYMPTOMS THAT SHOULD BE REPORTED IMMEDIATELY: ?*FEVER GREATER THAN 100.4 F (38 ?C) OR HIGHER ?*CHILLS OR SWEATING ?*NAUSEA AND VOMITING THAT IS NOT CONTROLLED WITH YOUR NAUSEA MEDICATION ?*UNUSUAL SHORTNESS OF BREATH ?*UNUSUAL BRUISING OR BLEEDING ?*URINARY PROBLEMS (pain or burning when urinating, or frequent urination) ?*BOWEL PROBLEMS (unusual diarrhea, constipation, pain near the anus) ?TENDERNESS IN MOUTH AND THROAT WITH OR WITHOUT PRESENCE OF ULCERS (sore throat, sores in mouth, or a toothache) ?UNUSUAL RASH, SWELLING OR PAIN  ?UNUSUAL VAGINAL DISCHARGE OR ITCHING  ? ?Items with * indicate a potential emergency and should be followed up as soon as possible or go to the Emergency Department if any problems should occur. ? ?Please show the CHEMOTHERAPY ALERT CARD or IMMUNOTHERAPY ALERT CARD at check-in  to the Emergency Department and triage nurse. ? ?Should you have questions after your visit or need to cancel or reschedule your appointment, please contact [facility]  Dept: [phone]  and follow the prompts.  Office hours are 8:00 a.m. to 4:30 p.m. Monday - Friday. Please note that voicemails left after 4:00 p.m. may not be returned until the following business day.  We are closed weekends and major holidays. You have access to a nurse at all times for urgent questions. Please call the main number to the clinic Dept: [phone] and follow the prompts. ? ? ?For any non-urgent questions, you may also contact your provider using MyChart. We now offer e-Visits for anyone 18 and older to request care online for non-urgent symptoms. For details visit [email or web address]. ?  ?Also download the MyChart app! Go to the app store, search "MyChart", open the app, select [facility], and log in with your MyChart username and password. ? ?Due to Covid, a mask is required upon entering the hospital/clinic. If you do not have a mask, one will be given to you upon arrival. For doctor visits, patients may have 1 support person aged 18 or older with them. For treatment visits, patients cannot have anyone with them due to current Covid guidelines and our immunocompromised population.  ? ?

## 2021-06-23 NOTE — Research (Signed)
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  This Clinical Research Nurse met with PRESLEIGH FELDSTEIN, [id number], on 06/23/21 in a manner and location that ensures patient privacy to discuss participation in the above listed research study.  Patient is Unaccompanied.  A copy of the informed consent document with embedded HIPAA language was provided to the patient.  Patient reads, speaks, and understands [place].   Patient was provided with the business card of this Nurse and encouraged to contact the research team with any questions.  Approximately 20 minutes were spent with the patient reviewing the informed consent documents.  Patient was provided the option of taking informed consent documents home to review and was encouraged to review at their convenience with their support network, including other care providers. Patient took the consent documents home to review.  Will f/u with patient after the New Year to determine interest.  Patient was originally screened for this study in October 2022 but her reported level of CIPN at the time too mild to be eligible.  However at her most recent MD visit she reported increased CIPN symptoms and requested to speak with Research again regarding this study.  Reports that today her CIPN is 7/10 and it has been ongoing for more than three months.  [name], RN, BSN Clinical Research Nurse I 06/23/21 11:02 AM

## 2021-06-28 ENCOUNTER — Telehealth: Payer: Self-pay | Admitting: Emergency Medicine

## 2021-06-28 NOTE — Telephone Encounter (Signed)
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  [name], RN, BSN Clinical Research Nurse I 06/28/21 11:22 AM

## 2021-06-28 NOTE — Telephone Encounter (Signed)
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  Pt is interested and agreed to come in at 10 am on 06/30/21 to consent.  Pt denies any questions/concerns at this time.  [name], RN, BSN Clinical Research Nurse I 06/28/21 9:27 AM

## 2021-06-30 ENCOUNTER — Other Ambulatory Visit: Payer: Self-pay

## 2021-06-30 ENCOUNTER — Other Ambulatory Visit (HOSPITAL_COMMUNITY): Payer: Self-pay

## 2021-06-30 ENCOUNTER — Inpatient Hospital Stay: Payer: Medicaid Other | Attending: Oncology | Admitting: Emergency Medicine

## 2021-06-30 DIAGNOSIS — C50412 Malignant neoplasm of upper-outer quadrant of left female breast: Secondary | ICD-10-CM | POA: Insufficient documentation

## 2021-06-30 DIAGNOSIS — Z809 Family history of malignant neoplasm, unspecified: Secondary | ICD-10-CM | POA: Insufficient documentation

## 2021-06-30 DIAGNOSIS — Z87891 Personal history of nicotine dependence: Secondary | ICD-10-CM | POA: Insufficient documentation

## 2021-06-30 DIAGNOSIS — Z807 Family history of other malignant neoplasms of lymphoid, hematopoietic and related tissues: Secondary | ICD-10-CM | POA: Insufficient documentation

## 2021-06-30 DIAGNOSIS — Z90722 Acquired absence of ovaries, bilateral: Secondary | ICD-10-CM | POA: Insufficient documentation

## 2021-06-30 DIAGNOSIS — Z90710 Acquired absence of both cervix and uterus: Secondary | ICD-10-CM | POA: Insufficient documentation

## 2021-06-30 DIAGNOSIS — Z9013 Acquired absence of bilateral breasts and nipples: Secondary | ICD-10-CM | POA: Insufficient documentation

## 2021-06-30 DIAGNOSIS — Z170 Estrogen receptor positive status [ER+]: Secondary | ICD-10-CM

## 2021-06-30 DIAGNOSIS — Z9079 Acquired absence of other genital organ(s): Secondary | ICD-10-CM | POA: Insufficient documentation

## 2021-06-30 MED ORDER — OLAPARIB 150 MG PO TABS
300.0000 mg | ORAL_TABLET | Freq: Two times a day (BID) | ORAL | 3 refills | Status: DC
  Filled 2021-06-30: qty 60, 15d supply, fill #0

## 2021-06-30 NOTE — Research (Signed)
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  Patient will continue with enrollment.  Menopausal status (women only): [name] confirmed that she has had a hysterectomy (not of child bearing potential).  Patient reports CIPN score today of 8/10.  Patient reports being able to swallow oral medications.  Patient denies taking any PEA medications/supplements previously or currently and is aware to not take any during trial.  Patient denies taking any of prohibited medications (opioids, cymbalta, gabapentin, lyrica) currently or in the last 7 days, and is aware to not take any during trial.  Patient has a history of cannibas use but states that she has not had any in more than 4 weeks.  Patient advised to not use cannibas products during study, verbalized understanding.  Reviewed all of protocol exclusion criteria with patient who verbally denies all.  Patient will be registered on Tuesday 07/04/21 when designated unblinded PharmD Raul Del is available on site.  Will contact patient on that date to complete her copy of Baseline questionnaire which she is aware to bring completed with her to drug/questionnaire pick up appt (to be scheduled).  [name], RN, BSN Clinical Research Nurse I 06/30/21 12:38 PM

## 2021-06-30 NOTE — Research (Signed)
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  This Clinical Research Nurse met with Sheryl Mcmillan, [id number] on 06/30/21 in a manner and location that ensures patient privacy to discuss participation in the above listed research study.  Patient is Accompanied by daughter Sheryl Mcmillan .  Patient was previously provided with informed consent documents.  Patient confirmed they have read the informed consent documents. ° °As outlined in the informed consent form, this Nurse and Sheryl Mcmillan discussed the purpose of the research study, the investigational nature of the study, study procedures and requirements for study participation, potential risks and benefits of study participation, as well as alternatives to participation.  This study is blinded or double-blinded. The patient understands participation is voluntary and they may withdraw from study participation at any time.  Each study arm was reviewed, and randomization discussed.  Potential side effects were reviewed with patient as outlined in the consent form, and patient made aware there may be side effects not yet known. The chance of receiving placebo was discussed. Patient understands enrollment is pending full eligibility review.  ° °Confidentiality and how the patient's information will be used as part of study participation were discussed.  Patient was informed there is not reimbursement provided for their time and effort spent on trial participation.  The patient is encouraged to discuss research study participation with their insurance provider to determine what costs they may incur as part of study participation, including research related injury.   ° °All questions were answered to patient's satisfaction.  The  informed consent with embedded HIPAA language was reviewed page by page.  The patient's mental and emotional status is appropriate to provide informed consent, and the patient verbalizes an understanding of study participation.  Patient has agreed to participate in the above listed research study and has voluntarily signed the informed consent version date with embedded HIPAA language, version date 08/12/2020 (revised date 09/26/20)  on 06/30/21 at 10:33 AM.  The patient was provided with a copy of the signed informed consent form with embedded HIPAA language for their reference.  No study specific procedures were obtained prior to the signing of the informed consent document.  Approximately 45 minutes were spent with the patient reviewing the informed consent documents.  Patient was not requested to complete a Release of Information form. ° °Sheryl 'Liza' Segal, RN, BSN °Clinical Research Nurse I °06/30/21 °12:46 PM ° °

## 2021-07-03 ENCOUNTER — Telehealth: Payer: Self-pay | Admitting: Emergency Medicine

## 2021-07-03 ENCOUNTER — Other Ambulatory Visit: Payer: Self-pay | Admitting: Hematology and Oncology

## 2021-07-03 NOTE — Telephone Encounter (Signed)
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  Discussed previous marijuana/THC use with patient who reports taking products in last 4 weeks.  At this time the patient is ineligible for study and will not be enrolled.  Patient states she wants to try stopping THC use for 1 week to see how she tolerates it, then she may move on to stopping for 1 month.  Pt agreed to f/u call in 1 week with this Clinical Research Nurse.  Pt denies any questions/concerns at this time.  [name], RN, BSN Clinical Research Nurse I 07/03/21 10:01 AM

## 2021-07-04 ENCOUNTER — Other Ambulatory Visit: Payer: Self-pay

## 2021-07-04 ENCOUNTER — Emergency Department (HOSPITAL_BASED_OUTPATIENT_CLINIC_OR_DEPARTMENT_OTHER)
Admission: EM | Admit: 2021-07-04 | Discharge: 2021-07-04 | Disposition: A | Discharge status: Home / Self Care | Payer: Medicaid Other | Attending: Emergency Medicine | Admitting: Emergency Medicine

## 2021-07-04 ENCOUNTER — Other Ambulatory Visit: Payer: Self-pay | Admitting: Hematology and Oncology

## 2021-07-04 ENCOUNTER — Emergency Department (HOSPITAL_BASED_OUTPATIENT_CLINIC_OR_DEPARTMENT_OTHER): Payer: Medicaid Other

## 2021-07-04 ENCOUNTER — Encounter (HOSPITAL_BASED_OUTPATIENT_CLINIC_OR_DEPARTMENT_OTHER): Payer: Self-pay

## 2021-07-04 ENCOUNTER — Inpatient Hospital Stay: Payer: Medicaid Other | Admitting: Licensed Clinical Social Worker

## 2021-07-04 DIAGNOSIS — Z853 Personal history of malignant neoplasm of breast: Secondary | ICD-10-CM | POA: Insufficient documentation

## 2021-07-04 DIAGNOSIS — R197 Diarrhea, unspecified: Secondary | ICD-10-CM | POA: Diagnosis not present

## 2021-07-04 DIAGNOSIS — R112 Nausea with vomiting, unspecified: Secondary | ICD-10-CM

## 2021-07-04 DIAGNOSIS — Z7982 Long term (current) use of aspirin: Secondary | ICD-10-CM | POA: Insufficient documentation

## 2021-07-04 DIAGNOSIS — R1012 Left upper quadrant pain: Secondary | ICD-10-CM | POA: Insufficient documentation

## 2021-07-04 LAB — COMPREHENSIVE METABOLIC PANEL
ALT: 47 U/L — ABNORMAL HIGH (ref 0–44)
AST: 31 U/L (ref 15–41)
Albumin: 4.2 g/dL (ref 3.5–5.0)
Alkaline Phosphatase: 105 U/L (ref 38–126)
Anion gap: 15 (ref 5–15)
BUN: 15 mg/dL (ref 6–20)
CO2: 18 mmol/L — ABNORMAL LOW (ref 22–32)
Calcium: 9.4 mg/dL (ref 8.9–10.3)
Chloride: 105 mmol/L (ref 98–111)
Creatinine, Ser: 0.82 mg/dL (ref 0.44–1.00)
GFR, Estimated: >60 mL/min (ref >60)
Glucose, Bld: 157 mg/dL — ABNORMAL HIGH (ref 70–99)
Potassium: 3.9 mmol/L (ref 3.5–5.1)
Sodium: 138 mmol/L (ref 135–145)
Total Bilirubin: 0.4 mg/dL (ref 0.3–1.2)
Total Protein: 7.9 g/dL (ref 6.5–8.1)

## 2021-07-04 LAB — CBC
HCT: 45.7 % (ref 36.0–46.0)
Hemoglobin: 15 g/dL (ref 12.0–15.0)
MCH: 26.9 pg (ref 26.0–34.0)
MCHC: 32.8 g/dL (ref 30.0–36.0)
MCV: 81.9 fL (ref 80.0–100.0)
Platelets: 390 10*3/uL (ref 150–400)
RBC: 5.58 MIL/uL — ABNORMAL HIGH (ref 3.87–5.11)
RDW: 14.7 % (ref 11.5–15.5)
WBC: 11.1 10*3/uL — ABNORMAL HIGH (ref 4.0–10.5)
nRBC: 0 % (ref 0.0–0.2)

## 2021-07-04 LAB — LIPASE, BLOOD: Lipase: 27 U/L (ref 11–51)

## 2021-07-04 MED ORDER — SODIUM CHLORIDE 0.9 % IV BOLUS
1000.0000 mL | Freq: Once | INTRAVENOUS | Status: AC
Start: 2021-07-04 — End: 2021-07-04
  Administered 2021-07-04: 1000 mL via INTRAVENOUS

## 2021-07-04 MED ORDER — HYDROMORPHONE HCL 1 MG/ML IJ SOLN
0.5000 mg | Freq: Once | INTRAMUSCULAR | Status: AC
  Administered 2021-07-04: 0.5 mg via INTRAVENOUS
  Filled 2021-07-04: qty 1

## 2021-07-04 MED ORDER — IOHEXOL 300 MG/ML  SOLN
100.0000 mL | Freq: Once | INTRAMUSCULAR | Status: AC | PRN
  Administered 2021-07-04: 100 mL via INTRAVENOUS

## 2021-07-04 MED ORDER — DICYCLOMINE HCL 20 MG PO TABS: 20.0000 mg | ORAL_TABLET | Freq: Two times a day (BID) | ORAL | 0 refills | Status: DC

## 2021-07-04 MED ORDER — ONDANSETRON 4 MG PO TBDP: 4.0000 mg | ORAL_TABLET | Freq: Three times a day (TID) | ORAL | 0 refills | Status: DC | PRN

## 2021-07-04 MED ORDER — ONDANSETRON HCL 4 MG/2ML IJ SOLN
4.0000 mg | Freq: Once | INTRAMUSCULAR | Status: AC
Start: 2021-07-04 — End: 2021-07-04
  Administered 2021-07-04: 4 mg via INTRAVENOUS
  Filled 2021-07-04: qty 2

## 2021-07-04 MED ORDER — ONDANSETRON HCL 4 MG/2ML IJ SOLN
4.0000 mg | Freq: Once | INTRAMUSCULAR | Status: AC
  Administered 2021-07-04: 4 mg via INTRAVENOUS
  Filled 2021-07-04: qty 2

## 2021-07-04 MED ORDER — OLAPARIB 150 MG PO TABS: 300.0000 mg | ORAL_TABLET | Freq: Two times a day (BID) | ORAL | 3 refills | Status: DC

## 2021-07-04 NOTE — Discharge Instructions (Signed)
Please read and follow all provided instructions.  Your diagnoses today include:  1. Nausea vomiting and diarrhea   2. Left upper quadrant abdominal pain     Tests performed today include: Blood cell counts and platelets: Slightly high white blood cell count Kidney and liver function tests Pancreas function test (called lipase) CT scan of your abdomen and pelvis: No serious findings or significant cause of your abdominal pain and vomiting Vital signs. See below for your results today.   Medications prescribed:  Zofran (ondansetron) - for nausea and vomiting  Bentyl - medication for intestinal cramps and spasms  Take any prescribed medications only as directed.  Home care instructions:  Follow any educational materials contained in this packet.  Follow-up instructions: Please follow-up with your primary care provider in the next 3 days for further evaluation of your symptoms.    Return instructions:  SEEK IMMEDIATE MEDICAL ATTENTION IF: The pain does not go away or becomes severe  A temperature above 101F develops  Repeated vomiting occurs (multiple episodes)  The pain becomes localized to portions of the abdomen. The right side could possibly be appendicitis. In an adult, the left lower portion of the abdomen could be colitis or diverticulitis.  Blood is being passed in stools or vomit (bright red or black tarry stools)  You develop chest pain, difficulty breathing, dizziness or fainting, or become confused, poorly responsive, or inconsolable (young children) If you have any other emergent concerns regarding your health  Additional Information: Abdominal (belly) pain can be caused by many things. Your caregiver performed an examination and possibly ordered blood/urine tests and imaging (CT scan, x-rays, ultrasound). Many cases can be observed and treated at home after initial evaluation in the emergency department. Even though you are being discharged home, abdominal pain can be  unpredictable. Therefore, you need a repeated exam if your pain does not resolve, returns, or worsens. Most patients with abdominal pain don't have to be admitted to the hospital or have surgery, but serious problems like appendicitis and gallbladder attacks can start out as nonspecific pain. Many abdominal conditions cannot be diagnosed in one visit, so follow-up evaluations are very important.  Your vital signs today were: BP 131/82    Pulse 78    Temp 98.6 F (37 C) (Oral)    Resp 18    Ht 5\' 11"  (1.803 m)    Wt 136.1 kg    LMP 09/29/2020 (Approximate)    SpO2 95%    BMI 41.84 kg/m  If your blood pressure (bp) was elevated above 135/85 this visit, please have this repeated by your doctor within one month. --------------

## 2021-07-04 NOTE — ED Notes (Addendum)
Pt requesting something to drink, was updated that npo until results of all tests and labs. Pt given small amt. of ice chips

## 2021-07-04 NOTE — ED Notes (Signed)
Patient transported to CT 

## 2021-07-04 NOTE — ED Provider Notes (Signed)
[facility] DEPART[facility] Provider Note   CSN: [id number] Arrival date & time: 07/04/21  0950     History  Chief Complaint  Patient presents with   Vomiting    Sheryl Mcmillan is a 46 y.o. female.  Patient with history of breast cancer, status postsurgery, chronic upper abdominal pain and cramping over the past year, previous EGD which was normal per patient report, colonoscopy yesterday done at [facility] --presents to the emergency department today for evaluation of intermittent left upper quadrant abdominal cramping, more frequent than baseline as well as new onset of vomiting and diarrhea starting after the colonoscopy.  When the cramping occurs, patient and family member state that she sees "bulging" in this area.  Patient has not been able to eat or drink because of the vomiting.  No fevers, chest pain or shortness of breath.  Vomiting and diarrhea nonbloody.  No urinary symptoms. The onset of this condition was acute on chronic. The course is constant. Aggravating factors: none. Alleviating factors: none.        Home Medications Prior to Admission medications   Medication Sig Start Date End Date Taking? Authorizing Provider  acetaminophen (TYLENOL) 650 MG CR tablet Take 650-1,300 mg by mouth every 8 (eight) hours as needed for pain.    [provider]  aspirin-acetaminophen-caffeine (EXCEDRIN MIGRAINE) [phone] MG tablet Take 2 tablets by mouth every 6 (six) hours as needed for headache.    [provider]  Bartolo Darter COVID-19 AG HOME TEST KIT TEST AS DIRECTED TODAY 02/07/21   [provider]  COLLAGEN PO Take 1 tablet by mouth daily.    [provider]  Multiple Vitamins-Minerals (ONE-A-DAY WOMENS PO) Take 1 tablet by mouth daily.    [provider]  NON FORMULARY Apply 1 application topically 2 (two) times daily. Skinuva scar cream    [provider]  olaparib (LYNPARZA) 150 MG tablet Take 2  tablets (300 mg total) by mouth 2 (two) times daily. Swallow whole. May take with food to decrease nausea and vomiting. 07/04/21   Benay Pike, MD      Allergies    Carboplatin, Other, and Wound dressing adhesive    Review of Systems   Review of Systems  Constitutional:  Negative for fever.  HENT:  Negative for rhinorrhea and sore throat.   Eyes:  Negative for redness.  Respiratory:  Negative for cough.   Cardiovascular:  Negative for chest pain.  Gastrointestinal:  Positive for abdominal pain, diarrhea, nausea and vomiting.  Genitourinary:  Negative for dysuria, frequency, hematuria and urgency.  Musculoskeletal:  Negative for myalgias.  Skin:  Negative for rash.  Neurological:  Negative for headaches.   Physical Exam Updated Vital Signs Pulse 99    Temp 98.6 F (37 C) (Oral)    Resp (!) 24    Ht 5' 11"  (1.803 m)    Wt 136.1 kg    LMP 09/29/2020 (Approximate)    SpO2 99%    BMI 41.84 kg/m  Physical Exam Vitals and nursing note reviewed.  Constitutional:      General: She is in acute distress.     Appearance: She is well-developed.     Comments: Patient unable to tolerate IV start.  She began yelling and stood up in the room without difficulty -- stating she needed to because of the abdominal cramping.  HENT:     Head: Normocephalic and atraumatic.     Right Ear: External ear normal.  Left Ear: External ear normal.     Nose: Nose normal.  Eyes:     Conjunctiva/sclera: Conjunctivae normal.  Cardiovascular:     Rate and Rhythm: Normal rate and regular rhythm.     Heart sounds: No murmur heard. Pulmonary:     Effort: No respiratory distress.     Breath sounds: No wheezing, rhonchi or rales.  Abdominal:     Palpations: Abdomen is soft.     Tenderness: There is abdominal tenderness. There is no guarding or rebound.     Comments: LUQ  Musculoskeletal:     Cervical back: Normal range of motion and neck supple.     Right lower leg: No edema.     Left lower leg: No  edema.  Skin:    General: Skin is warm and dry.     Findings: No rash.  Neurological:     General: No focal deficit present.     Mental Status: She is alert. Mental status is at baseline.     Motor: No weakness.  Psychiatric:        Mood and Affect: Mood normal.    ED Results / Procedures / Treatments   Labs (all labs ordered are listed, but only abnormal results are displayed) Labs Reviewed  COMPREHENSIVE METABOLIC PANEL - Abnormal; Notable for the following components:      Result Value   CO2 18 (*)    Glucose, Bld 157 (*)    ALT 47 (*)    All other components within normal limits  CBC - Abnormal; Notable for the following components:   WBC 11.1 (*)    RBC 5.58 (*)    All other components within normal limits  LIPASE, BLOOD  URINALYSIS, ROUTINE W REFLEX MICROSCOPIC    EKG None  Radiology No results found.  Procedures Procedures    Medications Ordered in ED Medications  ondansetron (ZOFRAN) injection 4 mg (has no administration in time range)  HYDROmorphone (DILAUDID) injection 0.5 mg (0.5 mg Intravenous Given 07/04/21 1025)  ondansetron (ZOFRAN) injection 4 mg (4 mg Intravenous Given 07/04/21 1025)  sodium chloride 0.9 % bolus 1,000 mL (1,000 mLs Intravenous New Bag/Given 07/04/21 1024)  iohexol (OMNIPAQUE) 300 MG/ML solution 100 mL (100 mLs Intravenous Contrast Given 07/04/21 1049)    ED Course/ Medical Decision Making/ A&P    Patient seen and examined. Plan discussed with patient.   Labs: CBC, CMP, lipase, UA.  No pregnancy as patient has had previous hysterectomy.  Imaging: CT abdomen pelvis with contrast ordered due to possibility of complication from procedure yesterday  Medications/Fluids: IV Dilaudid, IV Zofran, fluid bolus  Vital signs reviewed and are as follows: Pulse 99    Temp 98.6 F (37 C) (Oral)    Resp (!) 24    Ht 5' 11"  (1.803 m)    Wt 136.1 kg    LMP 09/29/2020 (Approximate)    SpO2 99%    BMI 41.84 kg/m   Initial impression: Left  upper quadrant abdominal pain  11:11 AM Reassessment performed. Patient appears more comfortable.  She states that her pain is improved.  CT chest performed.   Reviewed pertinent lab work with patient at bedside including reassuring labs, mildly elevated white blood cell count.  I have personally reviewed CT imaging did not see anything catastrophic.  Awaiting radiology read.  Vital signs reviewed and are as follows: BP (!) 161/116    Pulse 91    Temp 98.6 F (37 C) (Oral)    Resp [name])  24    Ht 5' 11"  (1.803 m)    Wt 136.1 kg    LMP 09/29/2020 (Approximate)    SpO2 99%    BMI 41.84 kg/m   Plan: Continue to monitor.  12:22 PM On re-exam patient appears comfortable.  Discussed pertinent results with patient at bedside. This included reassuring CT scan. They are comfortable with discharge at this time.   Home treatment: Prescription written for zofran, bentyl. Counseled to use tylenol and ibuprofen for supportive treatment.   Follow-up: Encouraged patient to follow-up with their provider if symptoms persist for 3 days. The patient was urged to return to the Emergency Department immediately with worsening of current symptoms, worsening abdominal pain, persistent vomiting, blood noted in stools, fever, or any other concerns. The patient verbalized understanding.                            Medical Decision Making  Patient with abdominal pain, nausea vomiting and diarrhea.  Pain in the left upper quadrant is acute on chronic, more frequent over the past few days and since her colonoscopy.  Suspect that her colonoscopy exacerbated her symptoms.  Vitals are stable, no fever. Labs mild leukocytosis otherwise labs reviewed and personally interpreted.  No other concerning findings imaging reviewed and no acute findings. No signs of dehydration, patient is tolerating PO's.  Certainly no signs of perforation today.  Lungs are clear and no signs suggestive of PNA. Low concern for appendicitis,  cholecystitis, pancreatitis, ruptured viscus, UTI, kidney stone, aortic dissection, aortic aneurysm or other emergent abdominal etiology. Supportive therapy indicated with return if symptoms worsen.          Final Clinical Impression(s) / ED Diagnoses Final diagnoses:  Nausea vomiting and diarrhea  Left upper quadrant abdominal pain    Rx / DC Orders ED Discharge Orders          Ordered    ondansetron (ZOFRAN-ODT) 4 MG disintegrating tablet  Every 8 hours PRN        07/04/21 1220    dicyclomine (BENTYL) 20 MG tablet  2 times daily        07/04/21 1220              Carlisle Cater, PA-C 07/04/21 [place], Q[place]a Skye, DO 07/04/21 1405

## 2021-07-04 NOTE — ED Notes (Signed)
Attempted to start/obtain IV access x 2, pt immediately begins screaming and jerking arm away, "You are doing it wrong". No further attempts at this time will be done. Pt very upset with this RN. Blood was obtained with first attempt.

## 2021-07-04 NOTE — ED Notes (Signed)
Pt experienced mild nausea d/t pain medication, has resolved at this time. Pt resting comfortably

## 2021-07-04 NOTE — ED Triage Notes (Signed)
Pt had colonoscopy yesterday morning. Started with vomiting & diarrhea yesterday afternoon. LUQ abdominal pain.

## 2021-07-04 NOTE — ED Notes (Signed)
Pt ambulatory with steady gait to restroom, urinated before providing specimen

## 2021-07-04 NOTE — ED Notes (Signed)
ED Provider at bedside. 

## 2021-07-05 ENCOUNTER — Other Ambulatory Visit: Payer: Self-pay

## 2021-07-05 ENCOUNTER — Other Ambulatory Visit (HOSPITAL_COMMUNITY): Payer: Self-pay

## 2021-07-05 DIAGNOSIS — C50412 Malignant neoplasm of upper-outer quadrant of left female breast: Secondary | ICD-10-CM

## 2021-07-05 MED ORDER — OLAPARIB 150 MG PO TABS
300.0000 mg | ORAL_TABLET | Freq: Two times a day (BID) | ORAL | 3 refills | Status: DC
  Filled 2021-07-05: qty 120, 30d supply, fill #0

## 2021-07-05 MED ORDER — OLAPARIB 150 MG PO TABS
300.0000 mg | ORAL_TABLET | Freq: Two times a day (BID) | ORAL | 3 refills | Status: DC
  Filled 2021-07-05: qty 60, 15d supply, fill #0

## 2021-07-05 NOTE — Telephone Encounter (Signed)
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 Prescription dose and frequency assessed, no interventions needed. Therapy started per the [place] trial.   Counseled patient on administration, dosing, side effects, monitoring, drug-food interactions, safe handling, storage, and disposal.  Patient will take Lynparza 193m tablets, 2 tablets (303m by mouth 2 times daily without regard to food.  Patient counseled that administering with food may increase tolerability.  Patient knows to avoid grapefruit or grapefruit juice while on therapy with Lynparza.  [name] date: when patient receives likely 07/07/2021   Adverse effects include but are not limited to: nausea, vomiting, diarrhea, taste changes, mouth sores, fatigue, constipation, decreased blood counts, and joint pain. Patient informed that pneumonitis (including some fatalities) has occurred rarely. Myelodysplastic syndrome/acute myeloid leukemia (MDS/AML) have been reported (rarely) in clinical trials.  Patient has anti-emetic on hand and knows to take it if nausea develops.   Patient will obtain anti diarrheal and alert the office of 4 or more loose stools above baseline.  Reviewed with patient importance of keeping a medication schedule and plan for any missed doses. No barriers to medication adherence identified.  Medication reconciliation performed and medication/allergy list updated.  Insurance authorization for LyLonie Peakas been obtained. Test claim at the pharmacy revealed copayment $0 for 1st fill of 30 days. This will ship from the [facility] 07/06/2021 to deliver to patient's home on  07/07/2021.  Patient informed the pharmacy will reach out 5-7 days prior to needing next fill of Lynparza to coordinate continued medication acquisition to prevent break in therapy.  All questions answered.  [name] understanding and appreciation.   Medication education handout placed in mail for patient. Patient knows to call the office with questions or concerns. Oral Chemotherapy Clinic phone number provided to patient.   KaDrema HalonPharmD Hematology/Oncology Clinical Pharmacist [id number]/04/2022   4:13 PM

## 2021-07-06 ENCOUNTER — Other Ambulatory Visit: Payer: Self-pay

## 2021-07-06 ENCOUNTER — Inpatient Hospital Stay: Payer: Medicaid Other | Admitting: Licensed Clinical Social Worker

## 2021-07-06 DIAGNOSIS — Z170 Estrogen receptor positive status [ER+]: Secondary | ICD-10-CM

## 2021-07-06 DIAGNOSIS — C50412 Malignant neoplasm of upper-outer quadrant of left female breast: Secondary | ICD-10-CM

## 2021-07-06 NOTE — Progress Notes (Signed)
[place] [employer]  Holiday representative met with patient for counseling related to processing cancer experience.   Pt experiencing grief around physical changes, loss of relationships during treatment (friendships and boyfriend), and changes in confidence and independence.  CSW provided space and empathic listening for patient to share her feelings around these changes.  Normalized feelings and discussed why end of treatment can be a time of increased emotions.    CSW and patient discussed breaking down goals (finding a job, regaining independence, increasing stamina) into small steps to rebuild confidence and help avoid being overwhelmed.  Praised pt for steps she is already taking such as journaling, Dentist, and exercising.   Follow-up: 1/24 before MD appt Pt also signed up for [facility] starting 2/27    Roby, [place] Worker [employer]

## 2021-07-10 ENCOUNTER — Telehealth: Payer: Self-pay | Admitting: Emergency Medicine

## 2021-07-10 NOTE — Telephone Encounter (Signed)
ACCRU-[place]-2102 - TREATMENT OF ESTABLISHED CHEMOTHERAPY-INDUCED NEUROPATHY WITH N-PALMITOYLETHANOLAMIDE, A CANNABIMIMETIC NUTRACEUTICAL: A RANDOMIZED DOUBLE-BLIND PHASE II PILOT TRIAL  Attempted to call pt to f/u on neuropathy study.  Call was dropped partway through conversation (occurred during another recent call to patient) and was able to reconnect with patient after calling back several times.  Left VM stating that I would f/u with patient during her 07/18/21 appt with MD Iruku and to call before then if she had any questions/concerns.  [name], RN, BSN Clinical Research Nurse I 07/10/21 1:23 PM

## 2021-07-12 ENCOUNTER — Other Ambulatory Visit (HOSPITAL_COMMUNITY): Payer: Self-pay

## 2021-07-18 ENCOUNTER — Other Ambulatory Visit: Payer: Self-pay | Admitting: Hematology and Oncology

## 2021-07-18 ENCOUNTER — Telehealth: Payer: Self-pay | Admitting: Emergency Medicine

## 2021-07-18 ENCOUNTER — Other Ambulatory Visit: Payer: Self-pay

## 2021-07-18 ENCOUNTER — Other Ambulatory Visit (HOSPITAL_COMMUNITY): Payer: Self-pay

## 2021-07-18 ENCOUNTER — Encounter: Payer: Self-pay | Admitting: Hematology and Oncology

## 2021-07-18 ENCOUNTER — Inpatient Hospital Stay (HOSPITAL_BASED_OUTPATIENT_CLINIC_OR_DEPARTMENT_OTHER): Payer: Medicaid Other | Admitting: Hematology and Oncology

## 2021-07-18 ENCOUNTER — Inpatient Hospital Stay: Payer: Medicaid Other | Admitting: Licensed Clinical Social Worker

## 2021-07-18 VITALS — BP 160/124 | HR 88 | Temp 97.7°F | Resp 18 | Ht 71.0 in | Wt 311.8 lb

## 2021-07-18 DIAGNOSIS — Z87891 Personal history of nicotine dependence: Secondary | ICD-10-CM | POA: Diagnosis not present

## 2021-07-18 DIAGNOSIS — Z90722 Acquired absence of ovaries, bilateral: Secondary | ICD-10-CM | POA: Diagnosis not present

## 2021-07-18 DIAGNOSIS — Z170 Estrogen receptor positive status [ER+]: Secondary | ICD-10-CM

## 2021-07-18 DIAGNOSIS — Z807 Family history of other malignant neoplasms of lymphoid, hematopoietic and related tissues: Secondary | ICD-10-CM | POA: Diagnosis not present

## 2021-07-18 DIAGNOSIS — Z90710 Acquired absence of both cervix and uterus: Secondary | ICD-10-CM | POA: Diagnosis not present

## 2021-07-18 DIAGNOSIS — Z9013 Acquired absence of bilateral breasts and nipples: Secondary | ICD-10-CM | POA: Diagnosis not present

## 2021-07-18 DIAGNOSIS — Z809 Family history of malignant neoplasm, unspecified: Secondary | ICD-10-CM | POA: Diagnosis not present

## 2021-07-18 DIAGNOSIS — C50412 Malignant neoplasm of upper-outer quadrant of left female breast: Secondary | ICD-10-CM | POA: Diagnosis present

## 2021-07-18 DIAGNOSIS — Z9079 Acquired absence of other genital organ(s): Secondary | ICD-10-CM | POA: Diagnosis not present

## 2021-07-18 DIAGNOSIS — D509 Iron deficiency anemia, unspecified: Secondary | ICD-10-CM | POA: Insufficient documentation

## 2021-07-18 MED ORDER — ONDANSETRON 4 MG PO TBDP: 4.0000 mg | ORAL_TABLET | Freq: Three times a day (TID) | ORAL | 0 refills | Status: DC | PRN

## 2021-07-18 MED ORDER — TAMOXIFEN CITRATE 20 MG PO TABS: 20.0000 mg | ORAL_TABLET | Freq: Every day | ORAL | 3 refills | Status: DC

## 2021-07-18 NOTE — Telephone Encounter (Signed)
ACCRU-[place]-2102 - TREATMENT OF ESTABLISHED CHEMOTHERAPY-INDUCED NEUROPATHY WITH N-PALMITOYLETHANOLAMIDE, A CANNABIMIMETIC NUTRACEUTICAL: A RANDOMIZED DOUBLE-BLIND PHASE II PILOT TRIAL  Followed up with patient regarding interest in re-screening for neuropathy study.  Patient states she is no longer interested in trying to stop THC use for trial.  Patient remains ineligible, will not f/u further.  Patient denies any further questions/concerns at this time.  [name], RN, BSN Clinical Research Nurse I 07/18/21 1:01 PM

## 2021-07-18 NOTE — Progress Notes (Signed)
Sheryl Mcmillan  [phone] [phone]) [phone]    ID: Sheryl Mcmillan DOB: 1976-03-19  MR#: [id number]  XID#:[id number]  Patient Care Team: Windell Hummingbird, PA-C as PCP - General (Physician Assistant) [name], RN as Oncology Nurse Navigator [name], RN as Oncology Nurse Navigator [name], MD as Consulting Physician (General Surgery) Sheryl Mcmillan, [name], MD as Consulting Physician (Oncology) [name], MD as Consulting Physician (Radiation Oncology) [name], MD as Consulting Physician (Plastic Surgery) Benay Pike, MD OTHER MD:  CHIEF COMPLAINT: estrogen receptor negative, Her2 positive breast cancer (s/p bilateral mastectomies); BRCA1+  CURRENT TREATMENT: Trastuzumab   INTERVAL HISTORY: Sheryl Mcmillan returns today for follow up and treatment of her estrogen receptor negative, Her2 positive breast cancer accompanied by her daughter (who incidentally is BRCA negative)  During her last visit with me, recommended considering olaparib adjuvant since her final pathology was triple negative.  We have also discussed about considering antiestrogen therapy since her initial pathology was weakly ER positive.  She agreed to try olaparib, prescription was dispensed however she has not started taking it.  She tells me that this was described as chemotherapy and she was worried about the adverse effects and she is reluctant to try it.  She does not want to take any further chemotherapy she says.  She is however willing to try tamoxifen.  She otherwise denies any new health complaints. No residual side effects from chemotherapy.  REVIEW OF SYSTEMS:  A detailed review of systems today was otherwise stable.    COVID 19 VACCINATION STATUS: Status post vaccine x2, no booster as of February 2022   HISTORY OF CURRENT ILLNESS: From the original intake note:   Sheryl Mcmillan presented with a palpable lump in the outer left breast, which she  initially felt approximately 6 months prior. She waited to seek medical attention because she had no insurance.  Eventually she enrolled in the [facility] program and underwent bilateral diagnostic mammography with tomography and left breast ultrasonography at [facility] on 05/10/2020 showing: breast density category B; vaguely palpable 2.5 cm mass in left breast at 1:30; no pathologic left axillary lymphadenopathy or evidence of right breast malignancy.  Accordingly on 05/18/2020 she proceeded to biopsy of the left breast area in question. The pathology from this procedure ([id number]) showed: invasive ductal carcinoma, grade 3. Prognostic indicators significant for: estrogen receptor, 40% positive with weak staining intensity and progesterone receptor, 0% negative. Proliferation marker Ki67 at 50%. HER2 positive by immunohistochemistry (3+).  The patient's subsequent history is as detailed below.   PAST MEDICAL HISTORY: Past Medical History:  Diagnosis Date   Arthritis    Breast cancer ([place])    Family history of non-Hodgkin's lymphoma 06/01/2020   GERD (gastroesophageal reflux disease)    Hemorrhoids    Migraines    PCOS (polycystic ovarian syndrome)    PONV (postoperative nausea and vomiting)     PAST SURGICAL HISTORY: Past Surgical History:  Procedure Laterality Date   AXILLARY SENTINEL NODE BIOPSY Left 11/07/2020   Procedure: LEFT AXILLARY SENTINEL NODE BIOPSY;  Surgeon: [name], MD;  Location: [facility] [facility];  Service: General;  Laterality: Left;   BREAST RECONSTRUCTION WITH PLACEMENT OF TISSUE EXPANDER AND FLEX HD (ACELLULAR HYDRATED DERMIS) Bilateral 11/07/2020   Procedure: BILATERAL BREAST RECONSTRUCTION WITH PLACEMENT OF TISSUE EXPANDER AND FLEX HD (ACELLULAR HYDRATED DERMIS);  Surgeon: [name], MD;  Location: [facility];  Service: Plastics;  Laterality: Bilateral;   COSMETIC SURGERY  DILATION AND CURETTAGE OF UTERUS     HEMORRHOID SURGERY     IR IMAGING GUIDED PORT  INSERTION  06/15/2020   MASS EXCISION Bilateral 04/25/2021   Procedure: excision of bilateral axillary breast tissue;  Surgeon: [name], MD;  Location: [facility];  Service: Plastics;  Laterality: Bilateral;   PORT-A-CATH REMOVAL Right 11/07/2020   Procedure: REMOVAL PORT-A-CATH;  Surgeon: [name], MD;  Location: [facility];  Service: General;  Laterality: Right;   REMOVAL OF BILATERAL TISSUE EXPANDERS WITH PLACEMENT OF BILATERAL BREAST IMPLANTS Bilateral 04/25/2021   Procedure: REMOVAL OF BILATERAL TISSUE EXPANDERS WITH PLACEMENT OF BILATERAL BREAST IMPLANTS;  Surgeon: [name], MD;  Location: [facility];  Service: Plastics;  Laterality: Bilateral;   ROBOTIC ASSISTED TOTAL HYSTERECTOMY WITH BILATERAL SALPINGO OOPHERECTOMY Bilateral 03/14/2021   Procedure: XI ROBOTIC ASSISTED TOTAL HYSTERECTOMY WITH BILATERAL SALPINGO OOPHORECTOMY;  Surgeon: Everitt Amber, MD;  Location: [facility];  Service: Gynecology;  Laterality: Bilateral;   TOTAL MASTECTOMY Bilateral 11/07/2020   Procedure: BILATERAL MASTECTOMY;  Surgeon: [name], MD;  Location: [facility];  Service: General;  Laterality: Bilateral;    FAMILY HISTORY: Family History  Problem Relation Age of Onset   Hypertension Mother    Diabetes Mother    Cancer Mother 53       unknown primary   Other Mother        brain tumor; dx 59s   Non-Hodgkin's lymphoma Brother 80   Other Maternal Uncle 3       brain tumor   Breast cancer Other        MGM's niece; dx late 64s   Pancreatic cancer Neg Hx    Colon cancer Neg Hx    Endometrial cancer Neg Hx    Prostate cancer Neg Hx    Ovarian cancer Neg Hx    She has no information on her father. Her mother died at age 81 from stage IV cancer. The primary cancer was unknown, but it was not breast.  Sheryl Mcmillan has two half brothers (through her mother), one of which was diagnosed with non-Hodgkin's lymphoma. There is no family history of breast, ovarian, or prostate  cancer to her knowledge. // Patient is BRCA1 positive. Daughter tested negative.   GYNECOLOGIC HISTORY:  Patient's last menstrual period was 09/29/2020 (approximate). Menarche: 46 years old Age at first live birth: 46 years old [place] P 1 LMP 05/02/2020 Contraceptive: never used HRT n/a  Hysterectomy? no BSO? 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 She is single. Her significant other [name] is a Freight forwarder. She lives at home with [name], her daughter [name],  [name]'s daughter [name] and her 34-monthold daughter [name] Daughter [name] age 45 is a bChief Operating Officerat [employer] in [place] [employer]    ADVANCED DIRECTIVES: Completed and notarized 09/07/2020.  She has named her aunt TMarveen Reeksas healthcare power of attorney   HEALTH MAINTENANCE: Social History   Tobacco Use   Smoking status: Former    Packs/day: 0.50    Years: 10.00    Pack years: 5.00    Types: Cigarettes    Quit date: 08/23/2020    Years since quitting: 0.9   Smokeless tobacco: Never  Vaping Use   Vaping Use: Never used  Substance Use Topics   Alcohol use: Yes    Comment: rare   Drug use: Yes    Types:  Marijuana    Comment: 5-7 days/week     Colonoscopy: 2018  PAP: 2018  Bone density: n/a (age)   Allergies  Allergen Reactions   Carboplatin Hives and Itching    Carboplatin reaction after dose #5 requiring Benadryl, Solumedrol, epinephrine, Claritin.   Other Hives    Baking Soda   Wound Dressing Adhesive Hives    Rips skin, prefers paper tape    Current Outpatient Medications  Medication Sig Dispense Refill   acetaminophen (TYLENOL) 650 MG CR tablet Take 650-1,300 mg by mouth every 8 (eight) hours as needed for pain.     aspirin-acetaminophen-caffeine (EXCEDRIN MIGRAINE) 250-250-65 MG tablet Take 2 tablets by mouth every 6 (six) hours as needed for headache.     BINAXNOW  COVID-19 AG HOME TEST KIT TEST AS DIRECTED TODAY     COLLAGEN PO Take 1 tablet by mouth daily.     dicyclomine (BENTYL) 20 MG tablet Take 1 tablet (20 mg total) by mouth 2 (two) times daily. 20 tablet 0   Multiple Vitamins-Minerals (ONE-A-DAY WOMENS PO) Take 1 tablet by mouth daily.     NON FORMULARY Apply 1 application topically 2 (two) times daily. Skinuva scar cream     olaparib (LYNPARZA) 150 MG tablet Take 2 tablets (300 mg total) by mouth 2 (two) times daily. Swallow whole. May take with food to decrease nausea and vomiting. 120 tablet 3   ondansetron (ZOFRAN-ODT) 4 MG disintegrating tablet Take 1 tablet (4 mg total) by mouth every 8 (eight) hours as needed for nausea or vomiting. 10 tablet 0   No current facility-administered medications for this visit.    OBJECTIVE: White woman who appears stated age 20:   07/18/21 1129  BP: (!) 160/124  Pulse: 88  Resp: 18  Temp: 97.7 F (36.5 C)  SpO2: 100%   Body mass index is 43.49 kg/m. Filed Weights   07/18/21 1129  Weight: (!) 311 lb 12.8 oz (141.4 kg)   Sclerae unicteric, EOMs intact Wearing a mask No cervical or supraclavicular adenopathy Lungs no rales or rhonchi Heart regular rate and rhythm Abd soft, nontender, positive bowel sounds MSK no focal spinal tenderness, no upper extremity lymphedema Neuro: nonfocal, well oriented, appropriate affect Breasts: Status post bilateral mastectomies with bilateral reconstruction.  She is happy with the reconstruction result.  No concern for residual or recurrent disease.  Axilla benign.   LAB RESULTS:  CMP     Component Value Date/Time   NA 138 07/04/2021 1001   K 3.9 07/04/2021 1001   CL 105 07/04/2021 1001   CO2 18 (L) 07/04/2021 1001   GLUCOSE 157 (H) 07/04/2021 1001   BUN 15 07/04/2021 1001   CREATININE 0.82 07/04/2021 1001   CREATININE 0.71 06/21/2021 0855   CALCIUM 9.4 07/04/2021 1001   PROT 7.9 07/04/2021 1001   ALBUMIN 4.2 07/04/2021 1001   AST 31 07/04/2021  1001   AST 16 06/21/2021 0855   ALT 47 (H) 07/04/2021 1001   ALT 24 06/21/2021 0855   ALKPHOS 105 07/04/2021 1001   BILITOT 0.4 07/04/2021 1001   BILITOT 0.2 (L) 06/21/2021 0855   GFRNONAA >60 07/04/2021 1001   GFRNONAA >60 06/21/2021 0855   GFRAA >60 06/06/2019 1205    No results found for: TOTALPROTELP, ALBUMINELP, A1GS, A2GS, BETS, BETA2SER, GAMS, MSPIKE, SPEI  Lab Results  Component Value Date   WBC 11.1 (H) 07/04/2021   NEUTROABS 4.7 06/21/2021   HGB 15.0 07/04/2021   HCT 45.7 07/04/2021   MCV 81.9 07/04/2021  PLT 390 07/04/2021    No results found for: LABCA2  No components found for: EHMCNO709  No results for input(s): INR in the last 168 hours.  No results found for: LABCA2  No results found for: CAN199  Lab Results  Component Value Date   CAN125 7.4 12/15/2020    No results found for: GGE366  No results found for: CA2729  No components found for: HGQUANT  No results found for: CEA1 / No results found for: CEA1   No results found for: AFPTUMOR  No results found for: CHROMOGRNA  No results found for: KPAFRELGTCHN, LAMBDASER, KAPLAMBRATIO (kappa/lambda light chains)  No results found for: HGBA, HGBA2QUANT, HGBFQUANT, HGBSQUAN (Hemoglobinopathy evaluation)   No results found for: LDH  No results found for: IRON, TIBC, IRONPCTSAT (Iron and TIBC)  No results found for: FERRITIN  Urinalysis    Component Value Date/Time   COLORURINE YELLOW 06/06/2019 [place] 06/06/2019 1205   LABSPEC >1.030 (H) 06/06/2019 1205   PHURINE 5.5 06/06/2019 1205   GLUCOSEU NEGATIVE 06/06/2019 1205   HGBUR TRACE (A) 06/06/2019 1205   BILIRUBINUR NEGATIVE 06/06/2019 1205   KETONESUR NEGATIVE 06/06/2019 1205   PROTEINUR NEGATIVE 06/06/2019 1205   UROBILINOGEN 1.0 05/09/2011 1805   NITRITE NEGATIVE 06/06/2019 1205   LEUKOCYTESUR NEGATIVE 06/06/2019 1205    STUDIES: CT ABDOMEN PELVIS W CONTRAST  Result Date: 07/04/2021 CLINICAL DATA:   Abdominal pain. Left upper quadrant abdominal pain postoperative. Nausea, vomiting, and diarrhea after colonoscopy yesterday. Colonoscopy yesterday morning. Vomiting and diarrhea yesterday afternoon. History of hysterectomy. EXAM: CT ABDOMEN AND PELVIS WITH CONTRAST TECHNIQUE: Multidetector CT imaging of the abdomen and pelvis was performed using the standard protocol following bolus administration of intravenous contrast. CONTRAST:  163m OMNIPAQUE IOHEXOL 300 MG/ML  SOLN COMPARISON:  Abdominal ultrasound 06/06/2019, CT abdomen and pelvis 05/29/2015 FINDINGS: Lower chest: Lung bases are unremarkable. Hepatobiliary: Smooth liver contours. There is diffuse decreased density seen throughout the liver suggesting fatty infiltration. No focal liver mass is identified. The gallbladder appears surgically absent, new from prior. No cholecystectomy clips are seen. No intrahepatic or extrahepatic biliary ductal dilatation. Pancreas: No mass or inflammatory fat stranding. No pancreatic ductal dilatation is seen. Spleen: Normal in size without focal abnormality. Adrenals/Urinary Tract: Adrenal glands are unremarkable. The kidneys enhance uniformly and are symmetric in size without hydronephrosis. No renal stone is seen. No renal mass is seen. No focal urinary bladder wall thickening, although evaluation is limited by only mild bladder distention. Stomach/Bowel: No focal bowel wall thickening. The terminal ileum is unremarkable. There are some air-fluid levels within the colon compatible with the patient's reported history of diarrhea. The appendix appears within normal limits. No dilated loops of bowel to indicate bowel obstruction. Vascular/Lymphatic: No abdominal aortic aneurysm. Reproductive: Interval hysterectomy.  No adnexal mass is seen. Other: Small fat containing umbilical hernia, unchanged. No abdominopelvic ascites. No pneumoperitoneum. Musculoskeletal: There again are chronic bilateral L5 pars defects. Grade 1  anterolisthesis of L5 on S1, unchanged. Severe L5-S1 disc space narrowing, unchanged. IMPRESSION: 1. Mild air-fluid levels within the colon consistent with patient's reported diarrhea. 2. No bowel obstruction or inflammatory bowel process. 3. Fatty liver. 4. Additional unchanged chronic findings as above. Electronically Signed   By: RYvonne Kendall  On: 07/04/2021 11:26     ELIGIBLE FOR AVAILABLE RESEARCH PROTOCOL: AET  ASSESSMENT: 46y.o. BRCA positive [place] woman status post left breast upper outer quadrant biopsy 05/18/2020 for a clinical T2N0, stage IIA invasive ductal carcinoma, grade  3, estrogen receptor weakly positive, progesterone receptor negative, with an MIB-1 of 50%, and HER-2 positive by immunohistochemistry  (1) genetics testing 06/16/2020 shows a pathogenic mutation in BRCA1 at c.2475del (p.Asp825Glufs*21)  (a)  Variant of uncertain significance in MSH3 at c.2724A>G (Silent).  (b) no additional deleterious mutations through the Multi-Cancer Panel offered by [facility] including AIP, ALK, APC, ATM, AXIN2,BAP1,  BARD1, BLM, BMPR1A, BRCA1, BRCA2, BRIP1, CASR, CDC73, CDH1, CDK4, CDKN1B, CDKN1C, CDKN2A (p14ARF), CDKN2A (p16INK4a), CEBPA, CHEK2, CTNNA1, DICER1, DIS3L2, EGFR (c.2369C>T, p.Thr790Met variant only), EPCAM (Deletion/duplication testing only), FH, FLCN, GATA2, GPC3, GREM1 (Promoter region deletion/duplication testing only), HOXB13 (c.251G>A, p.Gly84Glu), HRAS, KIT, MAX, MEN1, MET, MITF (c.952G>A, p.Glu318Lys variant only), MLH1, MSH2, MSH3, MSH6, MUTYH, NBN, NF1, NF2, NTHL1, PALB2, PDGFRA, PHOX2B, PMS2, POLD1, POLE, POT1, PRKAR1A, PTCH1, PTEN, RAD50, RAD51C, RAD51D, RB1, RECQL4, RET, RNF43, RUNX1, SDHAF2, SDHA (sequence changes only), SDHB, SDHC, SDHD, SMAD4, SMARCA4, SMARCB1, SMARCE1, STK11, SUFU, TERC, TERT, TMEM127, TP53, TSC1, TSC2, VHL, WRN and WT1.   (2) neoadjuvant chemotherapy to consist of carboplatin and docetaxel together with trastuzumab and Pertuzumab every 3 weeks x 6,  starting 06/16/2020, completed 09/28/2020   (a) pertuzumab omitted after cycle 1   (3) trastuzumab to be continued to complete 1 year (through December 2022)  (a) echo 06/09/2020 shows EF in the 60-65% range  (b) echo 09/14/2020 shows an ejection fraction in 65-70% range.  (c) echo 01/18/2021 showed an ejection fraction in the 60-65 % range  (d) echo 04/03/2021 showed an ejection fraction in the 55 to 60% range.  (4) status post bilateral mastectomies 11/07/2020 showing  (A) on the right, no evidence of malignancy  (B) on the left, residual ypT1c ypN0 invasive ductal carcinoma, grade 3, with negative margins, now triple negative, with an MIB-1 of 15%.  I am not entirely sure why she did not receive adjuvant Kadcyla despite residual disease.  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  (5) bilateral salpingo-oophorectomy and hysterectomy with incidental cholecystectomy 03/14/2021 showed benign patholog  PLAN:  Again this is a very pleasant 46 year old female patient who has completed 1 year of adjuvant trastuzumab.  She will have a repeat echocardiogram in March 2023, 3 months after her last Herceptin infusion. During her last visit we have also discussed about considering adjuvant olaparib since her final pathology was triple negative, we have obtained this medication for her but she apparently did not start taking it because she was very worried about the adverse effects and she did not want to take any more chemotherapy, this medication apparently arrived in a yellow chemotherapy bag.  She is however willing to try antiestrogen therapy.  I once again discussed that olaparib is targeted therapy, its a PARP inhibitor and has been used in adjuvant BRCA mutated patients with triple negative breast cancer.  She would like to think about this and she will let us know if she decides to try  it.  She would however want to proceed with tamoxifen.  I have once again discussed about mechanism of action of tamoxifen, adverse effects of tamoxifen including but not limited to postmenopausal symptoms, increased risk of DVT/PE, increased risk of cardiovascular events, increased risk of endometrial cancer.  The benefit from tamoxifen would be improvement in bone density.  She expressed understanding of the recommendations, will start with tamoxifen and will keep [place] informed about olaparib.  She did not qualify for the neuropathy  study.  I spent over 30 minutes in the care of this patient,I have reviewed her prior history, medical records, reviewed about questionable role of tamoxifen/olaparib surveillance recommendations.  Return to clinic in 3 months unless she starts olaparib.    Benay Pike MD  ECOG : 1

## 2021-07-18 NOTE — Progress Notes (Signed)
[facility] CSW Progress Note  Holiday representative met with patient for supportive counseling in survivorship. Patient reports doing overall better since last visit but still continuing to grieve losses (body changes, relationships) and is experiencing some survivor's guilt. CSW offered empathic listening will allowing space to continue processing these emotions today.  Patient is continuing to take steps towards a new normal and regaining independence. She is trying to allow herself more grace in not completing everything at once.  Pt was also able to recognize some positive relationship changes that occurred during her cancer journey.  Follow-up in 3 weeks for continued counseling    Christeen Douglas LCSW

## 2021-07-28 ENCOUNTER — Telehealth: Payer: Self-pay | Admitting: *Deleted

## 2021-08-08 ENCOUNTER — Other Ambulatory Visit: Payer: Medicaid Other | Admitting: Licensed Clinical Social Worker

## 2021-08-11 ENCOUNTER — Other Ambulatory Visit (HOSPITAL_COMMUNITY): Payer: Self-pay

## 2021-08-15 ENCOUNTER — Other Ambulatory Visit: Payer: Self-pay

## 2021-08-15 ENCOUNTER — Inpatient Hospital Stay: Payer: Medicaid Other | Attending: Oncology | Admitting: Licensed Clinical Social Worker

## 2021-08-15 DIAGNOSIS — C50412 Malignant neoplasm of upper-outer quadrant of left female breast: Secondary | ICD-10-CM

## 2021-08-15 DIAGNOSIS — Z170 Estrogen receptor positive status [ER+]: Secondary | ICD-10-CM

## 2021-08-15 NOTE — Progress Notes (Signed)
[place] CSW Progress Note  Holiday representative met with patient to provide supportive counseling. Pt reports progress since last visit. She is now working part-time (Friday & Sunday) at [employer] and continuing going to the gym. She is learning how to pace her physical activity so she does not over-exert herself.  Pt is also adjusting to her growing hair and received approval for her teeth.  Concerns today include questioning "what if" related to her mastectomies and if she should have chosen lumpectomy and radiation instead. CSW had pt recall what led to her initial decision and discussed how to use that reasoning and how it works for her personality and goals (independence, not doing chemo again in the future, being physically active). Normalized that it will still take time to adjust to her new body. Pt does have another appt with the plastic surgeon tomorrow to discuss another possible surgery.  Goal until next visit is for patient to document changes she notices in her stamina and appearance acceptance so that she can see her progress over time.  Pt will attend [facility].  Next visit: 08/31/2021    Christeen Douglas LCSW

## 2021-08-16 ENCOUNTER — Ambulatory Visit (INDEPENDENT_AMBULATORY_CARE_PROVIDER_SITE_OTHER): Payer: Medicaid Other | Admitting: Plastic Surgery

## 2021-08-16 DIAGNOSIS — C50412 Malignant neoplasm of upper-outer quadrant of left female breast: Secondary | ICD-10-CM

## 2021-08-16 DIAGNOSIS — Z170 Estrogen receptor positive status [ER+]: Secondary | ICD-10-CM | POA: Diagnosis not present

## 2021-08-16 NOTE — Progress Notes (Signed)
° °  Referring Provider Windell Hummingbird, PA-C [address] [place],  [place] [address]   CC:  Chief Complaint  Patient presents with   Follow-up      Sheryl Mcmillan is an 46 y.o. female.  HPI: Patient presents about 4 months out from placement of gel implants to replace her breast tissue expanders.  She feels like she is healed well enough.  She would like more fullness.  Her current implant size is 790 cc.  She is also bothered by some additional fullness in the right axilla  Review of Systems General: Denies fevers and chills  Physical Exam Vitals with BMI 07/18/2021 07/04/2021 07/04/2021  Height 5\' 11"  - -  Weight 311 lbs 13 oz - -  BMI 03.47 - -  Systolic 425 956 387  Diastolic 564 87 82  Pulse 88 81 78    General:  No acute distress,  Alert and oriented, Non-Toxic, Normal speech and affect Examination shows well-healed scars.  She has some slight fullness in the right axillary area compared to the left right at the end of her scar which goes fairly far back.  Assessment/Plan We discussed fat grafting before for additional fullness as she is currently at pretty much the max size for an implant.  I did inquire about larger gel implants but they are currently unavailable.  I do think that fat grafting would offer her some benefit.  At the same time I could liposuction and excise some skin in the right axilla to improve that contour.  We reviewed risks and benefits and she is interested in moving forward.  We will work on setting it up for her if possible.  [name] 08/16/2021, 12:36 PM

## 2021-08-21 ENCOUNTER — Ambulatory Visit: Payer: Medicaid Other

## 2021-08-21 ENCOUNTER — Other Ambulatory Visit (HOSPITAL_COMMUNITY): Payer: Self-pay

## 2021-08-30 ENCOUNTER — Emergency Department (HOSPITAL_BASED_OUTPATIENT_CLINIC_OR_DEPARTMENT_OTHER)
Admission: EM | Admit: 2021-08-30 | Discharge: 2021-08-30 | Disposition: A | Discharge status: Home / Self Care | Payer: Medicaid Other | Attending: Emergency Medicine | Admitting: Emergency Medicine

## 2021-08-30 ENCOUNTER — Other Ambulatory Visit: Payer: Self-pay

## 2021-08-30 ENCOUNTER — Encounter (HOSPITAL_BASED_OUTPATIENT_CLINIC_OR_DEPARTMENT_OTHER): Payer: Self-pay | Admitting: Emergency Medicine

## 2021-08-30 DIAGNOSIS — Z853 Personal history of malignant neoplasm of breast: Secondary | ICD-10-CM | POA: Diagnosis not present

## 2021-08-30 DIAGNOSIS — Z87891 Personal history of nicotine dependence: Secondary | ICD-10-CM | POA: Diagnosis not present

## 2021-08-30 DIAGNOSIS — B3731 Acute candidiasis of vulva and vagina: Secondary | ICD-10-CM

## 2021-08-30 DIAGNOSIS — N898 Other specified noninflammatory disorders of vagina: Secondary | ICD-10-CM | POA: Diagnosis present

## 2021-08-30 LAB — WET PREP, GENITAL
Clue Cells Wet Prep HPF POC: NONE SEEN
Sperm: NONE SEEN
Trich, Wet Prep: NONE SEEN
WBC, Wet Prep HPF POC: >=10 — AB (ref <10)

## 2021-08-30 MED ORDER — FLUCONAZOLE 150 MG PO TABS
150.0000 mg | ORAL_TABLET | Freq: Once | ORAL | Status: AC
  Administered 2021-08-30: 150 mg via ORAL
  Filled 2021-08-30: qty 1

## 2021-08-30 NOTE — ED Triage Notes (Signed)
Pt is c/o vaginal itching and irritation x 4 days  Denies any discharge   ?

## 2021-08-30 NOTE — ED Provider Notes (Signed)
?[place] [facility] ?[facility] Emergency Department ?Provider Note ?MRN:  [id number]  ?Arrival date & time: 08/30/21    ? ?Chief Complaint   ?Vaginal Itching ?  ?History of Present Illness   ?Sheryl Mcmillan is a 46 y.o. year-old female with a history of breast cancer, extract presenting to the ED with chief complaint of vaginal itch. ? ?New sexual partner a few days ago, now with vaginal itching for the past day or 2.  No discharge, no significant pain, no abdominal pain, no fever, no bleeding.  Had a hysterectomy several months ago. ? ?Review of Systems  ?A thorough review of systems was obtained and all systems are negative except as noted in the HPI and PMH.  ? ?Patient's Health History   ? ?Past Medical History:  ?Diagnosis Date  ? Arthritis   ? Breast cancer ([place])   ? Family history of non-Hodgkin's lymphoma 06/01/2020  ? GERD (gastroesophageal reflux disease)   ? Hemorrhoids   ? Migraines   ? PCOS (polycystic ovarian syndrome)   ? PONV (postoperative nausea and vomiting)   ?  ?Past Surgical History:  ?Procedure Laterality Date  ? AXILLARY SENTINEL NODE BIOPSY Left 11/07/2020  ? Procedure: LEFT AXILLARY SENTINEL NODE BIOPSY;  Surgeon: [name], MD;  Location: [place];  Service: General;  Laterality: Left;  ? BREAST RECONSTRUCTION WITH PLACEMENT OF TISSUE EXPANDER AND FLEX HD (ACELLULAR HYDRATED DERMIS) Bilateral 11/07/2020  ? Procedure: BILATERAL BREAST RECONSTRUCTION WITH PLACEMENT OF TISSUE EXPANDER AND FLEX HD (ACELLULAR HYDRATED DERMIS);  Surgeon: [name], MD;  Location: [place];  Service: Plastics;  Laterality: Bilateral;  ? COSMETIC SURGERY    ? DILATION AND CURETTAGE OF UTERUS    ? HEMORRHOID SURGERY    ? IR IMAGING GUIDED PORT INSERTION  06/15/2020  ? MASS EXCISION Bilateral 04/25/2021  ? Procedure: excision of bilateral axillary breast tissue;  Surgeon: [name], MD;  Location: [place];  Service: Plastics;  Laterality: Bilateral;  ? PORT-A-CATH REMOVAL  Right 11/07/2020  ? Procedure: REMOVAL PORT-A-CATH;  Surgeon: [name], MD;  Location: [place];  Service: General;  Laterality: Right;  ? REMOVAL OF BILATERAL TISSUE EXPANDERS WITH PLACEMENT OF BILATERAL BREAST IMPLANTS Bilateral 04/25/2021  ? Procedure: REMOVAL OF BILATERAL TISSUE EXPANDERS WITH PLACEMENT OF BILATERAL BREAST IMPLANTS;  Surgeon: [name], MD;  Location: [place];  Service: Plastics;  Laterality: Bilateral;  ? ROBOTIC ASSISTED TOTAL HYSTERECTOMY WITH BILATERAL SALPINGO OOPHERECTOMY Bilateral 03/14/2021  ? Procedure: XI ROBOTIC ASSISTED TOTAL HYSTERECTOMY WITH BILATERAL SALPINGO OOPHORECTOMY;  Surgeon: Everitt Amber, MD;  [facility]: [facility];  Service: Gynecology;  Laterality: Bilateral;  ? TOTAL MASTECTOMY Bilateral 11/07/2020  ? Procedure: BILATERAL MASTECTOMY;  Surgeon: [name], MD;  Location: [place];  Service: General;  Laterality: Bilateral;  ?  ?Family History  ?Problem Relation Age of Onset  ? Hypertension Mother   ? Diabetes Mother   ? Cancer Mother 45  ?     unknown primary  ? Other Mother   ?     brain tumor; dx 1s  ? Non-Hodgkin's lymphoma Brother 71  ? Other Maternal Uncle 69  ?     brain tumor  ? Breast cancer Other   ?     MGM's niece; dx late 63s  ? Pancreatic cancer Neg Hx   ? Colon cancer Neg Hx   ? Endometrial cancer Neg Hx   ? Prostate cancer Neg Hx   ? Ovarian cancer Neg Hx   ?  ?  Social History  ? ?Socioeconomic History  ? Marital status: Single  ?  Spouse name: Not on file  ? Number of children: 1  ? Years of education: Not on file  ? Highest education level: Some college, no degree  ?Occupational History  ? Not on file  ?Tobacco Use  ? Smoking status: Former  ?  Packs/day: 0.50  ?  Years: 10.00  ?  Pack years: 5.00  ?  Types: Cigarettes  ?  Quit date: 08/23/2020  ?  Years since quitting: 1.0  ? Smokeless tobacco: Never  ?Vaping Use  ? Vaping Use: Never used  ?Substance and Sexual Activity  ? Alcohol use: Not Currently  ?  Comment: rare  ? Drug use:  Yes  ?  Types: Marijuana  ?  Comment: 5-7 days/week  ? Sexual activity: Yes  ?  Birth control/protection: None  ?Other Topics Concern  ? Not on file  ?Social History Narrative  ? Not on file  ? ?Social Determinants of Health  ? ?Financial Resource Strain: Not on file  ?Food Insecurity: Not on file  ?Transportation Needs: Not on file  ?Physical Activity: Not on file  ?Stress: Not on file  ?Social Connections: Not on file  ?Intimate Partner Violence: Not on file  ?  ? ?Physical Exam  ? ?Vitals:  ? 08/30/21 0511  ?BP: (!) 136/94  ?Pulse: 81  ?Resp: 18  ?Temp: 98.3 ?F (36.8 ?C)  ?SpO2: 97%  ?  ?CONSTITUTIONAL: Well-appearing, NAD ?NEURO/PSYCH:  Alert and oriented x 3, no focal deficits ?EYES:  eyes equal and reactive ?ENT/NECK:  no LAD, no JVD ?CARDIO: Regular rate, well-perfused, normal S1 and S2 ?PULM:  CTAB no wheezing or rhonchi ?GI/GU:  non-distended, non-tender ?MSK/SPINE:  No gross deformities, no edema ?SKIN:  no rash, atraumatic ? ? ?*Additional and/or pertinent findings included in MDM below ? ?Diagnostic and Interventional Summary  ? ? EKG Interpretation ? ?Date/Time:    ?Ventricular Rate:    ?PR Interval:    ?QRS Duration:   ?QT Interval:    ?QTC Calculation:   ?R Axis:     ?Text Interpretation:   ?  ? ?  ? ?Labs Reviewed  ?WET PREP, GENITAL - Abnormal; Notable for the following components:  ?    Result Value  ? Yeast Wet Prep HPF POC PRESENT (*)   ? WBC, Wet Prep HPF POC >=10 (*)   ? All other components within normal limits  ?GC/CHLAMYDIA PROBE AMP ([place]) NOT AT [facility]  ?  ?No orders to display  ?  ?Medications  ?fluconazole (DIFLUCAN) tablet 200 mg (has no administration in time range)  ?  ? ?Procedures  /  Critical Care ?Procedures ? ?ED Course and Medical Decision Making  ?Initial Impression and Ddx ?Vaginal itch, largely no other complaints.  No abdominal tenderness, vital signs normal, no fever, highly doubt PID or any other emergent process.  Patient denies lesions or rash, no discharge.   Currently no indication for pelvic exam.  Still offered, however patient prefers a self swab for GC, chlamydia, BV, yeast infection. ? ?Past medical/surgical history that increases complexity of ED encounter: None ? ?Interpretation of Diagnostics ?Wet prep positive for yeast ? ?Patient Reassessment and Ultimate Disposition/Management ?Discharge home ? ?Patient management required discussion with the following services or consulting groups:  None ? ?Complexity of Problems Addressed ?Acute complicated illness or Injury ? ?Additional Data Reviewed and Analyzed ?Further history obtained from: ?None ? ?Additional Factors Impacting ED Encounter Risk ?  None ? ?Barth Kirks. Sedonia Small, MD ?[facility] Emergency Medicine ?[email or web address] ? ?Final Clinical Impressions(s) / ED Diagnoses  ? ?  ICD-10-CM   ?1. Itching in the vaginal area  N89.8   ?  ?2. Vaginal yeast infection  B37.31   ?  ?  ?ED Discharge Orders   ? ? None  ? ?  ?  ? ?Discharge Instructions Discussed with and Provided to Patient:  ? ? ? ?Discharge Instructions   ? ?  ?You were evaluated in the Emergency Department and after careful evaluation, we did not find any emergent condition requiring admission or further testing in the hospital. ? ?Your exam/testing today was overall reassuring.  Symptoms likely due to yeast infection.  We treated you in the emergency department. ? ?Please return to the Emergency Department if you experience any worsening of your condition.  Thank you for allowing [place] to be a part of your care. ? ? ? ? ? ?  ?Maudie Flakes, MD ?08/30/21 [phone] ? ?

## 2021-08-30 NOTE — ED Notes (Signed)
ED Provider at bedside. 

## 2021-08-30 NOTE — Discharge Instructions (Addendum)
You were evaluated in the Emergency Department and after careful evaluation, we did not find any emergent condition requiring admission or further testing in the hospital. ? ?Your exam/testing today was overall reassuring.  Symptoms likely due to yeast infection.  We treated you in the emergency department. ? ?Please return to the Emergency Department if you experience any worsening of your condition.  Thank you for allowing [place] to be a part of your care. ? ?

## 2021-08-30 NOTE — ED Notes (Signed)
Written and verbal inst to pt  Verbalized an understanding  To home  

## 2021-08-31 ENCOUNTER — Inpatient Hospital Stay: Payer: Medicaid Other | Admitting: Licensed Clinical Social Worker

## 2021-08-31 LAB — GC/CHLAMYDIA PROBE AMP (~~LOC~~) NOT AT ARMC
Chlamydia: NEGATIVE
Comment: NEGATIVE
Comment: NORMAL
Neisseria Gonorrhea: NEGATIVE

## 2021-09-01 ENCOUNTER — Other Ambulatory Visit (HOSPITAL_COMMUNITY): Payer: Self-pay

## 2021-09-04 ENCOUNTER — Other Ambulatory Visit (HOSPITAL_COMMUNITY): Payer: Self-pay

## 2021-09-05 ENCOUNTER — Other Ambulatory Visit: Payer: Self-pay

## 2021-09-05 ENCOUNTER — Inpatient Hospital Stay: Payer: Medicaid Other | Attending: Oncology | Admitting: Licensed Clinical Social Worker

## 2021-09-05 ENCOUNTER — Ambulatory Visit (HOSPITAL_COMMUNITY)
Admission: RE | Admit: 2021-09-05 | Discharge: 2021-09-05 | Disposition: A | Discharge status: Home / Self Care | Payer: Medicaid Other | Source: Ambulatory Visit | Attending: Hematology and Oncology | Admitting: Hematology and Oncology

## 2021-09-05 DIAGNOSIS — I517 Cardiomegaly: Secondary | ICD-10-CM | POA: Diagnosis not present

## 2021-09-05 DIAGNOSIS — C50412 Malignant neoplasm of upper-outer quadrant of left female breast: Secondary | ICD-10-CM | POA: Insufficient documentation

## 2021-09-05 DIAGNOSIS — Z170 Estrogen receptor positive status [ER+]: Secondary | ICD-10-CM | POA: Diagnosis not present

## 2021-09-05 DIAGNOSIS — Z01818 Encounter for other preprocedural examination: Secondary | ICD-10-CM | POA: Diagnosis not present

## 2021-09-05 DIAGNOSIS — Z0189 Encounter for other specified special examinations: Secondary | ICD-10-CM

## 2021-09-05 LAB — ECHOCARDIOGRAM COMPLETE
AV Peak grad: 11.8 mmHg
Ao pk vel: 1.72 m/s
Area-P 1/2: 3.63 cm2
S' Lateral: 2.3 cm

## 2021-09-05 MED ORDER — PERFLUTREN LIPID MICROSPHERE
1.0000 mL | INTRAVENOUS | Status: AC | PRN
  Administered 2021-09-05: 4 mL via INTRAVENOUS
  Filled 2021-09-05: qty 10

## 2021-09-05 NOTE — Progress Notes (Signed)
[place] [facility] CSW Progress Note ? ?Clinical Social Worker met with patient to provide counseling for adjustment in survivorship. Pt has been able to make significant progress processing her cancer experience and coming to terms with her current status. 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  She did become tearful thinking about her next surgery and needing to rely on her daughter again but was able to reframe quickly.  ? ?Main concern for patient currently is processing and grieving all of the losses she experienced in the last 2-3 years with deaths of family members and friends. She will continue work with her regular therapist for this. ? ?Follow-up: none at this time due to progress. Pt may call as needed in the future. ? ? ? ?Christeen Douglas LCSW ?

## 2021-09-05 NOTE — Progress Notes (Signed)
Echocardiogram ?2D Echocardiogram has been performed. ? ?Sheryl Mcmillan ?09/05/2021, 11:10 AM ?

## 2021-09-06 ENCOUNTER — Ambulatory Visit (INDEPENDENT_AMBULATORY_CARE_PROVIDER_SITE_OTHER): Payer: Medicaid Other | Admitting: Physician Assistant

## 2021-09-06 VITALS — BP 129/96 | HR 91 | Ht 71.0 in | Wt 310.2 lb

## 2021-09-06 DIAGNOSIS — C50412 Malignant neoplasm of upper-outer quadrant of left female breast: Secondary | ICD-10-CM

## 2021-09-06 DIAGNOSIS — Z170 Estrogen receptor positive status [ER+]: Secondary | ICD-10-CM

## 2021-09-06 NOTE — Progress Notes (Signed)
? ?  Patient ID: Sheryl Mcmillan, female    DOB: [birth year], 46 y.o.   MRN: [id number] ? ?Chief Complaint  ?Patient presents with  ? Pre-op Exam  ? ? ?  ICD-10-CM   ?1. Malignant neoplasm of upper-outer quadrant of left breast in female, estrogen receptor positive ([place])  C50.412   ? Z17.0   ?  ? ? ? ?History of Present Illness: ?Sheryl Mcmillan is a 46 y.o.  female  with a history of breast cancer s/p reconstruction.  She presents for preoperative evaluation for upcoming procedure, excision of excess right axillary tissue and abdominal liposuction with fat grafting to bilateral breasts, scheduled for 09/22/2021 with Dr. [name]. ? ?The patient has recently been experiencing postoperative nausea and vomiting after anesthesia.  No other complications.  She quit smoking tobacco last year and does not participate in any nicotine-containing products.  Maintenance tamoxifen and olaparib are listed in her chart, but she reports that she has not been taking them at all and does not plan to do so.  She states that this has been communicated to her oncologist.  For that reason, we will not need to reach out to oncology requesting that she hold them perioperatively.  She states the only medication that she takes today is her once a day women's vitamin.  She will hold that 5 days prior to surgery.  She does not take any other supplements.  She is not on thinners.  No history of MI or CVA.  She does have family history of thrombosis, no personal history.  Despite elevated Caprini Score, she has not previously required Lovenox injections postoperatively.  We will again proceed with mechanical prophylaxis given that this will likely be shorter surgery. ? ?Summary of Previous Visit: Patient was seen for consult by Dr. [name] on 08/17/2021.  At that time, she was 4 months out from time of her implant exchange.  Current implant size is 790 cc.  She is bothered by additional fullness in right axilla and is also hoping that she could have more  fullness.  Discussed axillary tissue excision along with fat grafting to help with fullness.  Patient expressed understanding and was agreeable to the plan. ? 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 ? ?PMH Significant for: Left-sided breast cancer s/p reconstruction, obesity, migraine headaches. ? ? ?Past Medical History: ?Allergies: ?Allergies  ?Allergen Reactions  ? Carboplatin Hives and Itching  ?  Carboplatin reaction after dose #5 requiring Benadryl, Solumedrol, epinephrine, Claritin.  ? Other Hives  ?  Baking Soda  ? Wound Dressing Adhesive Hives  ?  Rips skin, prefers paper tape  ? ? ?Current Medications: ? ?Current Outpatient Medications:  ?  acetaminophen (TYLENOL) 650 MG CR tablet, Take 650-1,300 mg by mouth every 8 (eight) hours as needed for pain., Disp: , Rfl:  ?  aspirin-acetaminophen-caffeine (EXCEDRIN MIGRAINE) 250-250-65 MG tablet, Take 2 tablets by mouth every 6 (six) hours as needed for headache., Disp: , Rfl:  ?  BINAXNOW COVID-19 AG HOME TEST KIT, TEST AS DIRECTED TODAY, Disp: , Rfl:  ?  COLLAGEN PO, Take 1 tablet by mouth daily., Disp: , Rfl:  ?  Multiple Vitamins-Minerals (ONE-A-DAY WOMENS PO), Take 1 tablet by mouth daily., Disp: , Rfl:  ?  NON FORMULARY, Apply 1 application topically 2 (two) times daily. Skinuva scar cream, Disp: , Rfl:  ?  ondansetron (ZOFRAN-ODT) 4 MG disintegrating tablet, Take  1 tablet (4 mg total) by mouth every 8 (eight) hours as needed for nausea or vomiting., Disp: 30 tablet, Rfl: 0 ? ?Past Medical Problems: ?Past Medical History:  ?Diagnosis Date  ? Arthritis   ? Breast cancer ([place])   ? Family history of non-Hodgkin's lymphoma 06/01/2020  ? GERD (gastroesophageal reflux disease)   ? Hemorrhoids   ? Migraines   ? PCOS (polycystic ovarian syndrome)   ? PONV (postoperative nausea and vomiting)   ? ? ?Past Surgical History: ?Past Surgical History:  ?Procedure Laterality Date  ? AXILLARY SENTINEL NODE  BIOPSY Left 11/07/2020  ? Procedure: LEFT AXILLARY SENTINEL NODE BIOPSY;  Surgeon: [name], MD;  Location: [place];  Service: General;  Laterality: Left;  ? BREAST RECONSTRUCTION WITH PLACEMENT OF TISSUE EXPANDER AND FLEX HD (ACELLULAR HYDRATED DERMIS) Bilateral 11/07/2020  ? Procedure: BILATERAL BREAST RECONSTRUCTION WITH PLACEMENT OF TISSUE EXPANDER AND FLEX HD (ACELLULAR HYDRATED DERMIS);  Surgeon: [name], MD;  Location: [place];  Service: Plastics;  Laterality: Bilateral;  ? COSMETIC SURGERY    ? DILATION AND CURETTAGE OF UTERUS    ? HEMORRHOID SURGERY    ? IR IMAGING GUIDED PORT INSERTION  06/15/2020  ? MASS EXCISION Bilateral 04/25/2021  ? Procedure: excision of bilateral axillary breast tissue;  Surgeon: [name], MD;  Location: [place];  Service: Plastics;  Laterality: Bilateral;  ? PORT-A-CATH REMOVAL Right 11/07/2020  ? Procedure: REMOVAL PORT-A-CATH;  Surgeon: [name], MD;  Location: [place];  Service: General;  Laterality: Right;  ? REMOVAL OF BILATERAL TISSUE EXPANDERS WITH PLACEMENT OF BILATERAL BREAST IMPLANTS Bilateral 04/25/2021  ? Procedure: REMOVAL OF BILATERAL TISSUE EXPANDERS WITH PLACEMENT OF BILATERAL BREAST IMPLANTS;  Surgeon: [name], MD;  Location: [place];  Service: Plastics;  Laterality: Bilateral;  ? ROBOTIC ASSISTED TOTAL HYSTERECTOMY WITH BILATERAL SALPINGO OOPHERECTOMY Bilateral 03/14/2021  ? Procedure: XI ROBOTIC ASSISTED TOTAL HYSTERECTOMY WITH BILATERAL SALPINGO OOPHORECTOMY;  Surgeon: Everitt Amber, MD;  [facility];  Service: Gynecology;  Laterality: Bilateral;  ? TOTAL MASTECTOMY Bilateral 11/07/2020  ? Procedure: BILATERAL MASTECTOMY;  Surgeon: [name], MD;  Location: [place];  Service: General;  Laterality: Bilateral;  ? ? ?Social History: ?Social History  ? ?Socioeconomic History  ? Marital status: Single  ?  Spouse name: Not on file  ? Number of children: 1  ? Years of education: Not on file  ?  Highest education level: Some college, no degree  ?Occupational History  ? Not on file  ?Tobacco Use  ? Smoking status: Former  ?  Packs/day: 0.50  ?  Years: 10.00  ?  Pack years: 5.00  ?  Types: Cigarettes  ?  Quit date: 08/23/2020  ?  Years since quitting: 1.0  ? Smokeless tobacco: Never  ?Vaping Use  ? Vaping Use: Never used  ?Substance and Sexual Activity  ? Alcohol use: Not Currently  ?  Comment: rare  ? Drug use: Yes  ?  Types: Marijuana  ?  Comment: 5-7 days/week  ? Sexual activity: Yes  ?  Birth control/protection: None  ?Other Topics Concern  ? Not on file  ?Social History Narrative  ? Not on file  ? ?Social Determinants of Health  ? ?Financial Resource Strain: Not on file  ?Food Insecurity: Not on file  ?Transportation Needs: Not on file  ?Physical Activity: Not on file  ?Stress: Not on file  ?Social Connections: Not on file  ?Intimate Partner Violence: Not on file  ? ? ?  Family History: ?Family History  ?Problem Relation Age of Onset  ? Hypertension Mother   ? Diabetes Mother   ? Cancer Mother 21  ?     unknown primary  ? Other Mother   ?     brain tumor; dx 34s  ? Non-Hodgkin's lymphoma Brother 83  ? Other Maternal Uncle 18  ?     brain tumor  ? Breast cancer Other   ?     MGM's niece; dx late 62s  ? Pancreatic cancer Neg Hx   ? Colon cancer Neg Hx   ? Endometrial cancer Neg Hx   ? Prostate cancer Neg Hx   ? Ovarian cancer Neg Hx   ? ? ?Review of Systems: ?ROS ?Denies recent infection or illness, hospitalization, chest pain or difficulty breathing. ? ?Physical Exam: ?Vital Signs ?BP (!) 129/96 (BP Location: Right Arm, Patient Position: Sitting, Cuff Size: Large)   Pulse 91   Ht _0  (1.803 m)   Wt (!) 310 lb 3.2 oz (140.7 kg)   LMP 09/29/2020 (Approximate)   SpO2 100%   BMI 43.26 kg/m?  ? ?Physical Exam ?Constitutional:   ?   General: Not in acute distress. ?   Appearance: Normal appearance. Not ill-appearing.  ?HENT:  ?   Head: Normocephalic and atraumatic.  ?Eyes:  ?   Pupils: Pupils are equal,  round. ?Cardiovascular:  ?   Rate and Rhythm: Normal rate. ?   Pulses: Normal pulses.  ?Pulmonary:  ?   Effort: No respiratory distress or increased work of breathing.  Speaks in full sentences. ?Abdominal:  ?

## 2021-09-06 NOTE — H&P (View-Only) (Signed)
? ?  Patient ID: Sheryl Mcmillan, female    DOB: [birth year], 46 y.o.   MRN: [id number] ? ?Chief Complaint  ?Patient presents with  ? Pre-op Exam  ? ? ?  ICD-10-CM   ?1. Malignant neoplasm of upper-outer quadrant of left breast in female, estrogen receptor positive ([place])  C50.412   ? Z17.0   ?  ? ? ? ?History of Present Illness: ?Sheryl Mcmillan is a 46 y.o.  female  with a history of breast cancer s/p reconstruction.  She presents for preoperative evaluation for upcoming procedure, excision of excess right axillary tissue and abdominal liposuction with fat grafting to bilateral breasts, scheduled for 09/22/2021 with Dr. [name]. ? ?The patient has recently been experiencing postoperative nausea and vomiting after anesthesia.  No other complications.  She quit smoking tobacco last year and does not participate in any nicotine-containing products.  Maintenance tamoxifen and olaparib are listed in her chart, but she reports that she has not been taking them at all and does not plan to do so.  She states that this has been communicated to her oncologist.  For that reason, we will not need to reach out to oncology requesting that she hold them perioperatively.  She states the only medication that she takes today is her once a day women's vitamin.  She will hold that 5 days prior to surgery.  She does not take any other supplements.  She is not on thinners.  No history of MI or CVA.  She does have family history of thrombosis, no personal history.  Despite elevated Caprini Score, she has not previously required Lovenox injections postoperatively.  We will again proceed with mechanical prophylaxis given that this will likely be shorter surgery. ? ?Summary of Previous Visit: Patient was seen for consult by Dr. [name] on 08/17/2021.  At that time, she was 4 months out from time of her implant exchange.  Current implant size is 790 cc.  She is bothered by additional fullness in right axilla and is also hoping that she could have more  fullness.  Discussed axillary tissue excision along with fat grafting to help with fullness.  Patient expressed understanding and was agreeable to the plan. ? 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 ? ?PMH Significant for: Left-sided breast cancer s/p reconstruction, obesity, migraine headaches. ? ? ?Past Medical History: ?Allergies: ?Allergies  ?Allergen Reactions  ? Carboplatin Hives and Itching  ?  Carboplatin reaction after dose #5 requiring Benadryl, Solumedrol, epinephrine, Claritin.  ? Other Hives  ?  Baking Soda  ? Wound Dressing Adhesive Hives  ?  Rips skin, prefers paper tape  ? ? ?Current Medications: ? ?Current Outpatient Medications:  ?  acetaminophen (TYLENOL) 650 MG CR tablet, Take 650-1,300 mg by mouth every 8 (eight) hours as needed for pain., Disp: , Rfl:  ?  aspirin-acetaminophen-caffeine (EXCEDRIN MIGRAINE) 250-250-65 MG tablet, Take 2 tablets by mouth every 6 (six) hours as needed for headache., Disp: , Rfl:  ?  BINAXNOW COVID-19 AG HOME TEST KIT, TEST AS DIRECTED TODAY, Disp: , Rfl:  ?  COLLAGEN PO, Take 1 tablet by mouth daily., Disp: , Rfl:  ?  Multiple Vitamins-Minerals (ONE-A-DAY WOMENS PO), Take 1 tablet by mouth daily., Disp: , Rfl:  ?  NON FORMULARY, Apply 1 application topically 2 (two) times daily. Skinuva scar cream, Disp: , Rfl:  ?  ondansetron (ZOFRAN-ODT) 4 MG disintegrating tablet, Take  1 tablet (4 mg total) by mouth every 8 (eight) hours as needed for nausea or vomiting., Disp: 30 tablet, Rfl: 0 ? ?Past Medical Problems: ?Past Medical History:  ?Diagnosis Date  ? Arthritis   ? Breast cancer ([place])   ? Family history of non-Hodgkin's lymphoma 06/01/2020  ? GERD (gastroesophageal reflux disease)   ? Hemorrhoids   ? Migraines   ? PCOS (polycystic ovarian syndrome)   ? PONV (postoperative nausea and vomiting)   ? ? ?Past Surgical History: ?Past Surgical History:  ?Procedure Laterality Date  ? AXILLARY SENTINEL NODE  BIOPSY Left 11/07/2020  ? Procedure: LEFT AXILLARY SENTINEL NODE BIOPSY;  Surgeon: [name], MD;  Location: [facility];  Service: General;  Laterality: Left;  ? BREAST RECONSTRUCTION WITH PLACEMENT OF TISSUE EXPANDER AND FLEX HD (ACELLULAR HYDRATED DERMIS) Bilateral 11/07/2020  ? Procedure: BILATERAL BREAST RECONSTRUCTION WITH PLACEMENT OF TISSUE EXPANDER AND FLEX HD (ACELLULAR HYDRATED DERMIS);  Surgeon: [name], MD;  Location: [facility];  Service: Plastics;  Laterality: Bilateral;  ? COSMETIC SURGERY    ? DILATION AND CURETTAGE OF UTERUS    ? HEMORRHOID SURGERY    ? IR IMAGING GUIDED PORT INSERTION  06/15/2020  ? MASS EXCISION Bilateral 04/25/2021  ? Procedure: excision of bilateral axillary breast tissue;  Surgeon: [name], MD;  Location: [place];  Service: Plastics;  Laterality: Bilateral;  ? PORT-A-CATH REMOVAL Right 11/07/2020  ? Procedure: REMOVAL PORT-A-CATH;  Surgeon: [name], MD;  Location: [place];  Service: General;  Laterality: Right;  ? REMOVAL OF BILATERAL TISSUE EXPANDERS WITH PLACEMENT OF BILATERAL BREAST IMPLANTS Bilateral 04/25/2021  ? Procedure: REMOVAL OF BILATERAL TISSUE EXPANDERS WITH PLACEMENT OF BILATERAL BREAST IMPLANTS;  Surgeon: [name], MD;  Location: [facility];  Service: Plastics;  Laterality: Bilateral;  ? ROBOTIC ASSISTED TOTAL HYSTERECTOMY WITH BILATERAL SALPINGO OOPHERECTOMY Bilateral 03/14/2021  ? Procedure: XI ROBOTIC ASSISTED TOTAL HYSTERECTOMY WITH BILATERAL SALPINGO OOPHORECTOMY;  Surgeon: Everitt Amber, MD;  Location: [facility];  Service: Gynecology;  Laterality: Bilateral;  ? TOTAL MASTECTOMY Bilateral 11/07/2020  ? Procedure: BILATERAL MASTECTOMY;  Surgeon: [name], MD;  Location: [facility];  Service: General;  Laterality: Bilateral;  ? ? ?Social History: ?Social History  ? ?Socioeconomic History  ? Marital status: Single  ?  Spouse name: Not on file  ? Number of children: 1  ? Years of education: Not on file  ?  Highest education level: Some college, no degree  ?Occupational History  ? Not on file  ?Tobacco Use  ? Smoking status: Former  ?  Packs/day: 0.50  ?  Years: 10.00  ?  Pack years: 5.00  ?  Types: Cigarettes  ?  Quit date: 08/23/2020  ?  Years since quitting: 1.0  ? Smokeless tobacco: Never  ?Vaping Use  ? Vaping Use: Never used  ?Substance and Sexual Activity  ? Alcohol use: Not Currently  ?  Comment: rare  ? Drug use: Yes  ?  Types: Marijuana  ?  Comment: 5-7 days/week  ? Sexual activity: Yes  ?  Birth control/protection: None  ?Other Topics Concern  ? Not on file  ?Social History Narrative  ? Not on file  ? ?Social Determinants of Health  ? ?Financial Resource Strain: Not on file  ?Food Insecurity: Not on file  ?Transportation Needs: Not on file  ?Physical Activity: Not on file  ?Stress: Not on file  ?Social Connections: Not on file  ?Intimate Partner Violence: Not on file  ? ? ?  Family History: ?Family History  ?Problem Relation Age of Onset  ? Hypertension Mother   ? Diabetes Mother   ? Cancer Mother 21  ?     unknown primary  ? Other Mother   ?     brain tumor; dx 34s  ? Non-Hodgkin's lymphoma Brother 83  ? Other Maternal Uncle 18  ?     brain tumor  ? Breast cancer Other   ?     MGM's niece; dx late 62s  ? Pancreatic cancer Neg Hx   ? Colon cancer Neg Hx   ? Endometrial cancer Neg Hx   ? Prostate cancer Neg Hx   ? Ovarian cancer Neg Hx   ? ? ?Review of Systems: ?ROS ?Denies recent infection or illness, hospitalization, chest pain or difficulty breathing. ? ?Physical Exam: ?Vital Signs ?BP (!) 129/96 (BP Location: Right Arm, Patient Position: Sitting, Cuff Size: Large)   Pulse 91   Ht _0  (1.803 m)   Wt (!) 310 lb 3.2 oz (140.7 kg)   LMP 09/29/2020 (Approximate)   SpO2 100%   BMI 43.26 kg/m?  ? ?Physical Exam ?Constitutional:   ?   General: Not in acute distress. ?   Appearance: Normal appearance. Not ill-appearing.  ?HENT:  ?   Head: Normocephalic and atraumatic.  ?Eyes:  ?   Pupils: Pupils are equal,  round. ?Cardiovascular:  ?   Rate and Rhythm: Normal rate. ?   Pulses: Normal pulses.  ?Pulmonary:  ?   Effort: No respiratory distress or increased work of breathing.  Speaks in full sentences. ?Abdominal:  ?

## 2021-09-13 ENCOUNTER — Other Ambulatory Visit: Payer: Self-pay

## 2021-09-15 ENCOUNTER — Telehealth: Payer: Self-pay | Admitting: Hematology and Oncology

## 2021-09-18 ENCOUNTER — Ambulatory Visit: Payer: Medicaid Other

## 2021-09-22 ENCOUNTER — Encounter (HOSPITAL_BASED_OUTPATIENT_CLINIC_OR_DEPARTMENT_OTHER): Payer: Self-pay | Admitting: Plastic Surgery

## 2021-09-22 ENCOUNTER — Telehealth: Payer: Self-pay | Admitting: Plastic Surgery

## 2021-09-22 ENCOUNTER — Ambulatory Visit (HOSPITAL_BASED_OUTPATIENT_CLINIC_OR_DEPARTMENT_OTHER): Payer: Medicaid Other | Admitting: Anesthesiology

## 2021-09-22 ENCOUNTER — Other Ambulatory Visit: Payer: Self-pay

## 2021-09-22 ENCOUNTER — Ambulatory Visit (HOSPITAL_BASED_OUTPATIENT_CLINIC_OR_DEPARTMENT_OTHER)
Admission: RE | Admit: 2021-09-22 | Discharge: 2021-09-22 | Disposition: A | Discharge status: Home / Self Care | Payer: Medicaid Other | Attending: Plastic Surgery | Admitting: Plastic Surgery

## 2021-09-22 ENCOUNTER — Encounter (HOSPITAL_BASED_OUTPATIENT_CLINIC_OR_DEPARTMENT_OTHER)
Admission: RE | Disposition: A | Discharge status: Home / Self Care | Payer: Self-pay | Source: Home / Self Care | Attending: Plastic Surgery

## 2021-09-22 ENCOUNTER — Other Ambulatory Visit: Payer: Self-pay | Admitting: Surgical

## 2021-09-22 ENCOUNTER — Telehealth: Payer: Self-pay

## 2021-09-22 DIAGNOSIS — C50412 Malignant neoplasm of upper-outer quadrant of left female breast: Secondary | ICD-10-CM

## 2021-09-22 DIAGNOSIS — Z421 Encounter for breast reconstruction following mastectomy: Secondary | ICD-10-CM

## 2021-09-22 DIAGNOSIS — C50911 Malignant neoplasm of unspecified site of right female breast: Secondary | ICD-10-CM | POA: Insufficient documentation

## 2021-09-22 DIAGNOSIS — L987 Excessive and redundant skin and subcutaneous tissue: Secondary | ICD-10-CM

## 2021-09-22 DIAGNOSIS — M1990 Unspecified osteoarthritis, unspecified site: Secondary | ICD-10-CM

## 2021-09-22 DIAGNOSIS — Z87891 Personal history of nicotine dependence: Secondary | ICD-10-CM | POA: Insufficient documentation

## 2021-09-22 DIAGNOSIS — Z170 Estrogen receptor positive status [ER+]: Secondary | ICD-10-CM | POA: Insufficient documentation

## 2021-09-22 DIAGNOSIS — G709 Myoneural disorder, unspecified: Secondary | ICD-10-CM | POA: Insufficient documentation

## 2021-09-22 DIAGNOSIS — Z9013 Acquired absence of bilateral breasts and nipples: Secondary | ICD-10-CM | POA: Diagnosis not present

## 2021-09-22 DIAGNOSIS — Z6841 Body Mass Index (BMI) 40.0 and over, adult: Secondary | ICD-10-CM | POA: Diagnosis not present

## 2021-09-22 HISTORY — PX: LIPOSUCTION WITH LIPOFILLING: SHX6436

## 2021-09-22 HISTORY — PX: BREAST CYST EXCISION: SHX579

## 2021-09-22 SURGERY — EXCISION, CYST, BREAST
Anesthesia: General | Site: Breast | Laterality: Right

## 2021-09-22 MED ORDER — CEFAZOLIN SODIUM-DEXTROSE 2-4 GM/100ML-% IV SOLN
INTRAVENOUS | Status: AC
  Filled 2021-09-22: qty 100

## 2021-09-22 MED ORDER — HYDROMORPHONE HCL 1 MG/ML IJ SOLN
INTRAMUSCULAR | Status: AC
  Filled 2021-09-22: qty 0.5

## 2021-09-22 MED ORDER — ACETAMINOPHEN 500 MG PO TABS
1000.0000 mg | ORAL_TABLET | Freq: Once | ORAL | Status: AC
Start: 2021-09-22 — End: 2021-09-22
  Administered 2021-09-22: 1000 mg via ORAL

## 2021-09-22 MED ORDER — LIDOCAINE 2% (20 MG/ML) 5 ML SYRINGE
INTRAMUSCULAR | Status: DC | PRN
  Administered 2021-09-22: 60 mg via INTRAVENOUS

## 2021-09-22 MED ORDER — CHLORHEXIDINE GLUCONATE CLOTH 2 % EX PADS: 6.0000 | MEDICATED_PAD | Freq: Once | CUTANEOUS | Status: DC

## 2021-09-22 MED ORDER — ONDANSETRON HCL 4 MG PO TABS: 4.0000 mg | ORAL_TABLET | Freq: Three times a day (TID) | ORAL | 0 refills | Status: DC | PRN

## 2021-09-22 MED ORDER — KETAMINE HCL 10 MG/ML IJ SOLN
INTRAMUSCULAR | Status: DC | PRN
  Administered 2021-09-22: 15 mg via INTRAVENOUS
  Administered 2021-09-22: 10 mg via INTRAVENOUS

## 2021-09-22 MED ORDER — DEXAMETHASONE SODIUM PHOSPHATE 4 MG/ML IJ SOLN
INTRAMUSCULAR | Status: DC | PRN
  Administered 2021-09-22: 10 mg via INTRAVENOUS

## 2021-09-22 MED ORDER — FENTANYL CITRATE (PF) 100 MCG/2ML IJ SOLN
INTRAMUSCULAR | Status: AC
Start: 2021-09-22 — End: ?
  Filled 2021-09-22: qty 2

## 2021-09-22 MED ORDER — LACTATED RINGERS IV SOLN
INTRAVENOUS | Status: DC
  Administered 2021-09-22: 10 mL/h via INTRAVENOUS

## 2021-09-22 MED ORDER — FENTANYL CITRATE (PF) 100 MCG/2ML IJ SOLN
25.0000 ug | INTRAMUSCULAR | Status: DC | PRN
  Administered 2021-09-22 (×2): 50 ug via INTRAVENOUS

## 2021-09-22 MED ORDER — FENTANYL CITRATE (PF) 100 MCG/2ML IJ SOLN
INTRAMUSCULAR | Status: DC | PRN
Start: 2021-09-22 — End: 2021-09-22
  Administered 2021-09-22 (×6): 50 ug via INTRAVENOUS

## 2021-09-22 MED ORDER — KETOROLAC TROMETHAMINE 30 MG/ML IJ SOLN: 30.0000 mg | Freq: Once | INTRAMUSCULAR | Status: DC | PRN

## 2021-09-22 MED ORDER — LIDOCAINE 2% (20 MG/ML) 5 ML SYRINGE
INTRAMUSCULAR | Status: AC
  Filled 2021-09-22: qty 5

## 2021-09-22 MED ORDER — MIDAZOLAM HCL 2 MG/2ML IJ SOLN
INTRAMUSCULAR | Status: AC
Start: 2021-09-22 — End: ?
  Filled 2021-09-22: qty 2

## 2021-09-22 MED ORDER — OXYCODONE HCL 5 MG/5ML PO SOLN: 5.0000 mg | Freq: Once | ORAL | Status: AC | PRN

## 2021-09-22 MED ORDER — EPINEPHRINE PF 1 MG/ML IJ SOLN
INTRAMUSCULAR | Status: DC | PRN
  Administered 2021-09-22: 2000 mL

## 2021-09-22 MED ORDER — KETOROLAC TROMETHAMINE 30 MG/ML IJ SOLN
INTRAMUSCULAR | Status: AC
  Filled 2021-09-22: qty 1

## 2021-09-22 MED ORDER — BUPIVACAINE HCL (PF) 0.25 % IJ SOLN
INTRAMUSCULAR | Status: AC
  Filled 2021-09-22: qty 60

## 2021-09-22 MED ORDER — ONDANSETRON HCL 4 MG/2ML IJ SOLN
INTRAMUSCULAR | Status: DC | PRN
  Administered 2021-09-22: 4 mg via INTRAVENOUS

## 2021-09-22 MED ORDER — DEXMEDETOMIDINE (PRECEDEX) IN NS 20 MCG/5ML (4 MCG/ML) IV SYRINGE
PREFILLED_SYRINGE | INTRAVENOUS | Status: AC
  Filled 2021-09-22: qty 5

## 2021-09-22 MED ORDER — HYDROMORPHONE HCL 1 MG/ML IJ SOLN
0.5000 mg | INTRAMUSCULAR | Status: AC | PRN
  Administered 2021-09-22 (×4): 0.5 mg via INTRAVENOUS

## 2021-09-22 MED ORDER — MIDAZOLAM HCL 5 MG/5ML IJ SOLN
INTRAMUSCULAR | Status: DC | PRN
  Administered 2021-09-22: 2 mg via INTRAVENOUS

## 2021-09-22 MED ORDER — KETAMINE HCL 50 MG/5ML IJ SOSY
PREFILLED_SYRINGE | INTRAMUSCULAR | Status: AC
  Filled 2021-09-22: qty 5

## 2021-09-22 MED ORDER — CEFAZOLIN SODIUM-DEXTROSE 1-4 GM/50ML-% IV SOLN
INTRAVENOUS | Status: AC
  Filled 2021-09-22: qty 50

## 2021-09-22 MED ORDER — FENTANYL CITRATE (PF) 100 MCG/2ML IJ SOLN
INTRAMUSCULAR | Status: AC
  Filled 2021-09-22: qty 2

## 2021-09-22 MED ORDER — DEXAMETHASONE SODIUM PHOSPHATE 10 MG/ML IJ SOLN
INTRAMUSCULAR | Status: AC
  Filled 2021-09-22: qty 1

## 2021-09-22 MED ORDER — CEFAZOLIN IN SODIUM CHLORIDE 3-0.9 GM/100ML-% IV SOLN
3.0000 g | INTRAVENOUS | Status: AC
  Administered 2021-09-22: 3 g via INTRAVENOUS

## 2021-09-22 MED ORDER — SCOPOLAMINE 1 MG/3DAYS TD PT72
1.0000 | MEDICATED_PATCH | TRANSDERMAL | Status: DC
  Administered 2021-09-22: 1.5 mg via TRANSDERMAL

## 2021-09-22 MED ORDER — PROPOFOL 10 MG/ML IV BOLUS
INTRAVENOUS | Status: AC
  Filled 2021-09-22: qty 20

## 2021-09-22 MED ORDER — HYDROCODONE-ACETAMINOPHEN 5-325 MG PO TABS: 1.0000 | ORAL_TABLET | Freq: Four times a day (QID) | ORAL | 0 refills | Status: AC | PRN

## 2021-09-22 MED ORDER — AMISULPRIDE (ANTIEMETIC) 5 MG/2ML IV SOLN
10.0000 mg | Freq: Once | INTRAVENOUS | Status: AC | PRN
  Administered 2021-09-22: 10 mg via INTRAVENOUS

## 2021-09-22 MED ORDER — ONDANSETRON HCL 4 MG/2ML IJ SOLN
INTRAMUSCULAR | Status: AC
Start: 2021-09-22 — End: ?
  Filled 2021-09-22: qty 2

## 2021-09-22 MED ORDER — PROPOFOL 500 MG/50ML IV EMUL
INTRAVENOUS | Status: DC | PRN
  Administered 2021-09-22: 50 ug/kg/min via INTRAVENOUS

## 2021-09-22 MED ORDER — EPINEPHRINE PF 1 MG/ML IJ SOLN
INTRAMUSCULAR | Status: AC
  Filled 2021-09-22: qty 2

## 2021-09-22 MED ORDER — AMISULPRIDE (ANTIEMETIC) 5 MG/2ML IV SOLN
INTRAVENOUS | Status: AC
  Filled 2021-09-22: qty 2

## 2021-09-22 MED ORDER — SCOPOLAMINE 1 MG/3DAYS TD PT72
MEDICATED_PATCH | TRANSDERMAL | Status: AC
  Filled 2021-09-22: qty 1

## 2021-09-22 MED ORDER — ACETAMINOPHEN 500 MG PO TABS
ORAL_TABLET | ORAL | Status: AC
  Filled 2021-09-22: qty 2

## 2021-09-22 MED ORDER — OXYCODONE HCL 5 MG PO TABS
5.0000 mg | ORAL_TABLET | Freq: Once | ORAL | Status: AC | PRN
  Administered 2021-09-22: 5 mg via ORAL

## 2021-09-22 MED ORDER — DEXMEDETOMIDINE (PRECEDEX) IN NS 20 MCG/5ML (4 MCG/ML) IV SYRINGE
PREFILLED_SYRINGE | INTRAVENOUS | Status: DC | PRN
  Administered 2021-09-22: 8 ug via INTRAVENOUS
  Administered 2021-09-22: 12 ug via INTRAVENOUS

## 2021-09-22 MED ORDER — HYDROMORPHONE HCL 1 MG/ML IJ SOLN
INTRAMUSCULAR | Status: AC
Start: 2021-09-22 — End: ?
  Filled 2021-09-22: qty 0.5

## 2021-09-22 MED ORDER — PROPOFOL 500 MG/50ML IV EMUL
INTRAVENOUS | Status: AC
  Filled 2021-09-22: qty 100

## 2021-09-22 MED ORDER — KETOROLAC TROMETHAMINE 30 MG/ML IJ SOLN
INTRAMUSCULAR | Status: DC | PRN
  Administered 2021-09-22: 30 mg via INTRAVENOUS

## 2021-09-22 MED ORDER — PROPOFOL 10 MG/ML IV BOLUS
INTRAVENOUS | Status: DC | PRN
  Administered 2021-09-22: 200 mg via INTRAVENOUS

## 2021-09-22 MED ORDER — OXYCODONE HCL 5 MG PO TABS
ORAL_TABLET | ORAL | Status: AC
  Filled 2021-09-22: qty 1

## 2021-09-22 SURGICAL SUPPLY — 53 items
ADH SKN CLS APL DERMABOND .7 (GAUZE/BANDAGES/DRESSINGS) ×4
APL PRP STRL LF DISP 70% ISPRP (MISCELLANEOUS) ×4
APL SKNCLS STERI-STRIP NONHPOA (GAUZE/BANDAGES/DRESSINGS)
BENZOIN TINCTURE PRP APPL 2/3 (GAUZE/BANDAGES/DRESSINGS) IMPLANT
BINDER ABDOMINAL 12 XL 75-84 (SOFTGOODS) ×1 IMPLANT
BINDER BREAST 3XL (GAUZE/BANDAGES/DRESSINGS) IMPLANT
BINDER BREAST LRG (GAUZE/BANDAGES/DRESSINGS) IMPLANT
BLADE SURG 10 STRL SS (BLADE) ×3 IMPLANT
BLADE SURG 11 STRL SS (BLADE) ×3 IMPLANT
BNDG CMPR MED 10X6 ELC LF (GAUZE/BANDAGES/DRESSINGS) ×2
BNDG ELASTIC 6X10 VLCR STRL LF (GAUZE/BANDAGES/DRESSINGS) ×1 IMPLANT
CANISTER SUCT 1200ML W/VALVE (MISCELLANEOUS) ×1 IMPLANT
CHLORAPREP W/TINT 26 (MISCELLANEOUS) ×6 IMPLANT
COVER BACK TABLE 60X90IN (DRAPES) ×3 IMPLANT
COVER MAYO STAND STRL (DRAPES) ×3 IMPLANT
DERMABOND ADVANCED (GAUZE/BANDAGES/DRESSINGS) ×2
DERMABOND ADVANCED .7 DNX12 (GAUZE/BANDAGES/DRESSINGS) IMPLANT
DRAPE TOP ARMCOVERS (MISCELLANEOUS) ×3 IMPLANT
DRAPE U-SHAPE 76X120 STRL (DRAPES) ×3 IMPLANT
DRAPE UTILITY XL STRL (DRAPES) ×3 IMPLANT
DRSG PAD ABDOMINAL 8X10 ST (GAUZE/BANDAGES/DRESSINGS) ×9 IMPLANT
ELECT REM PT RETURN 9FT ADLT (ELECTROSURGICAL) ×3
ELECTRODE REM PT RTRN 9FT ADLT (ELECTROSURGICAL) ×2 IMPLANT
EXTRACTOR CANIST REVOLVE STRL (CANNISTER) ×3 IMPLANT
GLOVE SURG ENC TEXT LTX SZ7.5 (GLOVE) ×6 IMPLANT
GOWN STRL REUS W/ TWL LRG LVL3 (GOWN DISPOSABLE) ×4 IMPLANT
GOWN STRL REUS W/TWL LRG LVL3 (GOWN DISPOSABLE) ×6
GOWN STRL REUS W/TWL XL LVL3 (GOWN DISPOSABLE) ×3 IMPLANT
LINER CANISTER 1000CC FLEX (MISCELLANEOUS) ×4 IMPLANT
MARKER SKIN DUAL TIP RULER LAB (MISCELLANEOUS) ×1 IMPLANT
NDL SAFETY ECLIPSE 18X1.5 (NEEDLE) ×2 IMPLANT
NEEDLE HYPO 18GX1.5 SHARP (NEEDLE) ×3
NS IRRIG 1000ML POUR BTL (IV SOLUTION) ×1 IMPLANT
PACK BASIN DAY SURGERY FS (CUSTOM PROCEDURE TRAY) ×3 IMPLANT
PAD ALCOHOL SWAB (MISCELLANEOUS) ×3 IMPLANT
PENCIL SMOKE EVACUATOR (MISCELLANEOUS) ×1 IMPLANT
SHEET MEDIUM DRAPE 40X70 STRL (DRAPES) ×6 IMPLANT
SLEEVE SCD COMPRESS KNEE MED (STOCKING) ×3 IMPLANT
SPONGE T-LAP 18X18 ~~LOC~~+RFID (SPONGE) ×4 IMPLANT
STRIP CLOSURE SKIN 1/2X4 (GAUZE/BANDAGES/DRESSINGS) ×3 IMPLANT
SUT CHROMIC 5 0 RB 1 27 (SUTURE) ×3 IMPLANT
SUT PLAIN 5 0 P 3 18 (SUTURE) ×1 IMPLANT
SUT VLOC 90 3-0 CLR P12 (SUTURE) ×1 IMPLANT
SYR 10ML LL (SYRINGE) ×9 IMPLANT
SYR 50ML LL SCALE MARK (SYRINGE) ×1 IMPLANT
SYR BULB IRRIG 60ML STRL (SYRINGE) ×1 IMPLANT
SYR TB 1ML LL NO SAFETY (SYRINGE) ×3 IMPLANT
TOWEL GREEN STERILE FF (TOWEL DISPOSABLE) ×3 IMPLANT
TUBE CONNECTING 20X1/4 (TUBING) ×3 IMPLANT
TUBING INFILTRATION IT-10001 (TUBING) ×3 IMPLANT
TUBING SET GRADUATE ASPIR 12FT (MISCELLANEOUS) ×3 IMPLANT
UNDERPAD 30X36 HEAVY ABSORB (UNDERPADS AND DIAPERS) ×6 IMPLANT
YANKAUER SUCT BULB TIP NO VENT (SUCTIONS) ×3 IMPLANT

## 2021-09-22 NOTE — Telephone Encounter (Signed)
Pt is calling in stating that she is in a lot of pain from her surgery this morning and was not given and medication and would like to have some kind of pain medication called in to Pharm:  [facility] in [address] [employer].  Pt would like to have a call back. ?

## 2021-09-22 NOTE — Transfer of Care (Signed)
Immediate Anesthesia Transfer of Care Note ? ?Patient: Sheryl Mcmillan ? ?Procedure(s) Performed: Excision of right axillary excess soft tissue. (Right: Breast) ?Fat grafting bilateral breast (Bilateral: Breast) ? ?Patient Location: PACU ? ?Anesthesia Type:General ? ?Level of Consciousness: drowsy ? ?Airway & Oxygen Therapy: Patient Spontanous Breathing and Patient connected to face mask oxygen ? ?Post-op Assessment: Report given to RN and Post -op Vital signs reviewed and stable ? ?Post vital signs: Reviewed and stable ? ?Last Vitals:  ?Vitals Value Taken Time  ?BP 146/79 09/22/21 0938  ?Temp    ?Pulse 94 09/22/21 0941  ?Resp 15 09/22/21 0941  ?SpO2 100 % 09/22/21 0941  ?Vitals shown include unvalidated device data. ? ?Last Pain:  ?Vitals:  ? 09/22/21 0633  ?TempSrc: Oral  ?PainSc: 0-No pain  ?   ? ?Patients Stated Pain Goal: 7 (09/22/21 9458) ? ?Complications: No notable events documented. ?

## 2021-09-22 NOTE — Interval H&P Note (Signed)
Patient seen and examined. Risks and benefits discussed. Proceed with surgery.

## 2021-09-22 NOTE — Anesthesia Preprocedure Evaluation (Addendum)
Anesthesia Evaluation  ?Patient identified by MRN, date of birth, ID band ?Patient awake ? ? ? ?Reviewed: ?Allergy & Precautions, NPO status , Patient's Chart, lab work & pertinent test results ? ?History of Anesthesia Complications ?(+) PONV and history of anesthetic complications ? ?Airway ?Mallampati: III ? ?TM Distance: >3 FB ?Neck ROM: Full ? ? ? Dental ? ?(+) Edentulous Upper, Edentulous Lower ?  ?Pulmonary ?former smoker,  ?  ?Pulmonary exam normal ?breath sounds clear to auscultation ? ? ? ? ? ? Cardiovascular ?negative cardio ROS ?Normal cardiovascular exam ?Rhythm:Regular Rate:Normal ? ?ECHO: ?Left ventricular ejection fraction, by estimation, is 60 to 65%. The left ventricle has ?normal function. The left ventricle has no regional wall motion abnormalities. There is ?mild concentric left ventricular hypertrophy. Left ventricular diastolic parameters are ?consistent with Grade I diastolic dysfunction (impaired relaxation). ?Right ventricular systolic function is normal. The right ventricular size is normal. ?Left atrial size was mildly dilated. ?The mitral valve is normal in structure. No evidence of mitral valve regurgitation. No ?evidence of mitral stenosis. ?The aortic valve is normal in structure. Aortic valve regurgitation is not visualized. No ?aortic stenosis is present. ?The inferior vena cava is normal in size with greater than 50% respiratory variability, suggesting right atrial pressure of 3 mmHg. ?  ?Neuro/Psych ? Headaches,  Neuromuscular disease negative psych ROS  ? GI/Hepatic ?negative GI ROS, Neg liver ROS,   ?Endo/Other  ?Morbid obesityPCOS (polycystic ovarian syndrome) ? Renal/GU ?negative Renal ROS  ? ?  ?Musculoskeletal ? ?(+) Arthritis ,  ? Abdominal ?(+) + obese,   ?Peds ? Hematology ?negative hematology ROS ?(+)   ?Anesthesia Other Findings ?Malignant Neoplasm of upper outer quadrant of left breast ? Reproductive/Obstetrics ? ?   ? ? ? ? ? ? ? ? ? ? ? ? ? ?  ?  ? ? ? ? ? ? ? ?Anesthesia Physical ?Anesthesia Plan ? ?ASA: 3 ? ?Anesthesia Plan: General  ? ?Post-op Pain Management:   ? ?Induction: Intravenous ? ?PONV Risk Score and Plan: 4 or greater and Ondansetron, Dexamethasone, Midazolam, Scopolamine patch - Pre-op, Treatment may vary due to age or medical condition and Propofol infusion ? ?Airway Management Planned: LMA ? ?Additional Equipment:  ? ?Intra-op Plan:  ? ?Post-operative Plan: Extubation in OR ? ?Informed Consent: I have reviewed the patients History and Physical, chart, labs and discussed the procedure including the risks, benefits and alternatives for the proposed anesthesia with the patient or authorized representative who has indicated his/her understanding and acceptance.  ? ? ? ? ? ?Plan Discussed with: CRNA ? ?Anesthesia Plan Comments:   ? ? ? ? ?Anesthesia Quick Evaluation ? ?

## 2021-09-22 NOTE — Telephone Encounter (Signed)
[facility] staff has sent this to Dr. [name]. ?

## 2021-09-22 NOTE — Progress Notes (Signed)
Post op meds 

## 2021-09-22 NOTE — Discharge Instructions (Addendum)
Activity: Avoid strenuous activity.  No heavy lifting. ? ?Diet: No restrictions.  Try to optimize nutrition with plenty of fruits and vegetables to improve healing. ? ?Wound Care: Leave wrap for 2 days.  You may then remove and shower normally.  After wrap comes off recommend sports bra for gentle compression.  Would continue the abdominal binder except when showering until follow up appointment. ? ?Follow-Up: Scheduled for next week. ? ?Things to watch for:  Call the office if you experience fever, chills, persistent nausea, or significant bleeding.  Mild wound drainage is common after breast surgery and should not be cause for alarm. ? ?Next dose of Tylenol after 1pm as needed for pain. ? ?Next dose of Ibuprofen/NSAID's after 1pm as needed for pain. ? ? ?Post Anesthesia Home Care Instructions ? ?Activity: ?Get plenty of rest for the remainder of the day. A responsible individual must stay with you for 24 hours following the procedure.  ?For the next 24 hours, DO NOT: ?-Drive a car ?-Paediatric nurse ?-Drink alcoholic beverages ?-Take any medication unless instructed by your physician ?-Make any legal decisions or sign important papers. ? ?Meals: ?Start with liquid foods such as gelatin or soup. Progress to regular foods as tolerated. Avoid greasy, spicy, heavy foods. If nausea and/or vomiting occur, drink only clear liquids until the nausea and/or vomiting subsides. Call your physician if vomiting continues. ? ?Special Instructions/Symptoms: ?Your throat may feel dry or sore from the anesthesia or the breathing tube placed in your throat during surgery. If this causes discomfort, gargle with warm salt water. The discomfort should disappear within 24 hours. ? ?If you had a scopolamine patch placed behind your ear for the management of post- operative nausea and/or vomiting: ? ?1. The medication in the patch is effective for 72 hours, after which it should be removed.  Wrap patch in a tissue and discard in the  trash. Wash hands thoroughly with soap and water. ?2. You may remove the patch earlier than 72 hours if you experience unpleasant side effects which may include dry mouth, dizziness or visual disturbances. ?3. Avoid touching the patch. Wash your hands with soap and water after contact with the patch. ?    ? ?

## 2021-09-22 NOTE — Anesthesia Postprocedure Evaluation (Signed)
Anesthesia Post Note ? ?Patient: LILLIEMAE FRUGE ? ?Procedure(s) Performed: Excision of right axillary excess soft tissue. (Right: Breast) ?Fat grafting bilateral breast (Bilateral: Breast) ? ?  ? ?Patient location during evaluation: PACU ?Anesthesia Type: General ?Level of consciousness: awake ?Pain control: Pain score improving. ?Vital Signs Assessment: post-procedure vital signs reviewed and stable ?Respiratory status: spontaneous breathing, nonlabored ventilation, respiratory function stable and patient connected to nasal cannula oxygen ?Cardiovascular status: blood pressure returned to baseline and stable ?Postop Assessment: no apparent nausea or vomiting ?Anesthetic complications: no ?Comments: Patient complaining of severe pain in the PACU unrelieved by ordered medications. Decision made to give patient additional anesthetics and pain medications. Pain score improved thereafter and patient moved to phase II.  ? ? ?No notable events documented. ? ?Last Vitals:  ?Vitals:  ? 09/22/21 1145 09/22/21 1245  ?BP: (!) 164/89 130/79  ?Pulse: 74 (!) 103  ?Resp: 13 13  ?Temp:  37.1 ?C  ?SpO2: 95% 95%  ?  ?Last Pain:  ?Vitals:  ? 09/22/21 1230  ?TempSrc:   ?PainSc: 5   ? ? ?  ?  ?  ?  ?  ?  ? ?Lavelle Berland P Leslieanne Cobarrubias ? ? ? ? ?

## 2021-09-22 NOTE — Telephone Encounter (Signed)
Patient called to say she had surgery with Dr. [name] today and her pain medications are not at the pharmacy.  She said the medicine they gave her for surgery is starting to wear off.  She asked that we please call in her pain medication to the [facility] on [address] in [place].  Please call. ?

## 2021-09-22 NOTE — Op Note (Signed)
Operative Note  ? ?DATE OF OPERATION: 09/22/2021 ? ?SURGICAL DEPARTMENT: Plastic Surgery ? ?PREOPERATIVE DIAGNOSES: 1.  Breast reconstruction after cancer ?2.  Right axillary soft tissue excess ? ?POSTOPERATIVE DIAGNOSES:  same ? ?PROCEDURE: 1.  Fat grafting to right breast totaling 190 cc ?2.  Fat grafting to the left breast totaling 170 cc ?3.  Excision of right axillary soft tissue totaling 20 x 4 cm in length with complex closure ? ?SURGEON: Talmadge Coventry, MD ? ?ASSISTANT: None ? ?ANESTHESIA:  General.  ? ?COMPLICATIONS: None.  ? ?INDICATIONS FOR PROCEDURE:  ?The patient, Sheryl Mcmillan is a 46 y.o. female born on April 17, 1976, is here for treatment of breast reconstruction after cancer ?MRN: [id number] ? ?CONSENT:  ?Informed consent was obtained directly from the patient. Risks, benefits and alternatives were fully discussed. Specific risks including but not limited to bleeding, infection, hematoma, seroma, scarring, pain, contracture, asymmetry, wound healing problems, and need for further surgery were all discussed. The patient did have an ample opportunity to have questions answered to satisfaction.  ? ?DESCRIPTION OF PROCEDURE:  ?The patient was taken to the operating room. SCDs were placed and antibiotics were given.  General anesthesia was administered.  The patient's operative site was prepped and draped in a sterile fashion. A time out was performed and all information was confirmed to be correct.  I started by infiltrating tumescent solution into the abdomen and the right axillary area.  I had marked her preoperatively in the standing position for fat infiltration in the upper pole and marked the excess skin in the right axilla.  This was given time to work.  Power assisted liposuction was then performed with a 5 mm cannula using the revolve system targeted in the right axilla and also the abdominal wall.  This was done until I reached the maximum level on the revolve canister.  Fat was then washed  according to the manufacturer's instructions.  This was then infiltrated into the upper pole on both breast taking care to inject small aliquots and distributed diffusely.  This gave her a nice result.  Liposuction incisions and infiltration incisions were closed with interrupted 5-0 fast gut sutures.  Then turned my attention to the right axilla.  The marked out skin was excised with a 10 blade and cautery.  Hemostasis was ensured.  Surrounding skin was then undermined and advanced and closed in layers with interrupted buried in sorb staples and a running 3 OV lock.  Steri-Strips were applied followed by compressive garments. ? ?The patient tolerated the procedure well.  There were no complications. The patient was allowed to wake from anesthesia, extubated and taken to the recovery room in satisfactory condition.  ? ?

## 2021-09-22 NOTE — Anesthesia Procedure Notes (Signed)
Procedure Name: LMA Insertion ?Date/Time: 09/22/2021 7:54 AM ?Performed by: Ezequiel Kayser, CRNA ?Pre-anesthesia Checklist: Patient identified, Emergency Drugs available, Suction available and Patient being monitored ?Patient Re-evaluated:Patient Re-evaluated prior to induction ?Oxygen Delivery Method: Circle System Utilized ?Preoxygenation: Pre-oxygenation with 100% oxygen ?Induction Type: IV induction ?Ventilation: Two handed mask ventilation required ?LMA: LMA with gastric port inserted ?LMA Size: 4.0 ?Number of attempts: 1 ?Airway Equipment and Method: Bite block ?Placement Confirmation: positive ETCO2 ?Tube secured with: Tape ?Dental Injury: Teeth and Oropharynx as per pre-operative assessment  ? ? ? ? ?

## 2021-09-25 ENCOUNTER — Encounter (HOSPITAL_BASED_OUTPATIENT_CLINIC_OR_DEPARTMENT_OTHER): Payer: Self-pay | Admitting: Plastic Surgery

## 2021-09-25 LAB — SURGICAL PATHOLOGY

## 2021-09-25 NOTE — Telephone Encounter (Signed)
New prescription was called to the pharmacy by [facility] on the (09/22/21).//AB/CMA ?

## 2021-09-28 ENCOUNTER — Ambulatory Visit (INDEPENDENT_AMBULATORY_CARE_PROVIDER_SITE_OTHER): Payer: Medicaid Other | Admitting: Plastic Surgery

## 2021-09-28 ENCOUNTER — Other Ambulatory Visit: Payer: Self-pay

## 2021-09-28 DIAGNOSIS — Z170 Estrogen receptor positive status [ER+]: Secondary | ICD-10-CM

## 2021-09-28 DIAGNOSIS — C50412 Malignant neoplasm of upper-outer quadrant of left female breast: Secondary | ICD-10-CM

## 2021-09-28 NOTE — Progress Notes (Signed)
Patient presents postop from fat grafting bilateral breast and excision of excess tissue in the right axilla.  She is overall happy and feels like things are going well.  On exam she at this point appears to have a nice correction in the upper pole bilateral breast and has a much improved shape.  She has expected bruising and swelling in the abdomen donor site.  Incision on the right axilla is healing appropriately with no signs of any subcutaneous fluid or complications.  We will have her continue to wear compressive garments in the abdomen for another week or so.  I will plan to see her in 6 to 8 weeks to more accurately assess her result.  All of her questions were answered. ?

## 2021-10-02 ENCOUNTER — Ambulatory Visit: Payer: Medicaid Other | Attending: General Surgery

## 2021-10-02 VITALS — Wt 315.5 lb

## 2021-10-02 DIAGNOSIS — Z483 Aftercare following surgery for neoplasm: Secondary | ICD-10-CM

## 2021-10-02 NOTE — Therapy (Signed)
?OUTPATIENT PHYSICAL THERAPY SOZO SCREENING NOTE ? ? ?Patient Name: Sheryl Mcmillan ?MRN: [id number] ?DOB:1975/08/29, 46 y.o., female ?Today's Date: 10/02/2021 ? ?PCP: Windell Hummingbird, PA-C ?REFERRING PROVIDER: [name], MD ? ? PT End of Session - 10/02/21 6283   ? ? Visit Number 16   # unchanged due to screen only  ? PT Start Time [phone]   ? PT Stop Time 0941   ? PT Time Calculation (min) 5 min   ? Activity Tolerance Patient tolerated treatment well   ? Behavior During Therapy [facility] for tasks assessed/performed   ? ?  ?  ? ?  ? ? ?Past Medical History:  ?Diagnosis Date  ? Arthritis   ? Breast cancer ([place])   ? Family history of non-Hodgkin's lymphoma 06/01/2020  ? GERD (gastroesophageal reflux disease)   ? Hemorrhoids   ? Migraines   ? PCOS (polycystic ovarian syndrome)   ? PONV (postoperative nausea and vomiting)   ? ?Past Surgical History:  ?Procedure Laterality Date  ? AXILLARY SENTINEL NODE BIOPSY Left 11/07/2020  ? Procedure: LEFT AXILLARY SENTINEL NODE BIOPSY;  Surgeon: [name], MD;  Location: [place];  Service: General;  Laterality: Left;  ? BREAST CYST EXCISION Right 09/22/2021  ? Procedure: Excision of right axillary excess soft tissue.;  Surgeon: [name], MD;  Location: [place];  Service: Plastics;  Laterality: Right;  ? BREAST RECONSTRUCTION WITH PLACEMENT OF TISSUE EXPANDER AND FLEX HD (ACELLULAR HYDRATED DERMIS) Bilateral 11/07/2020  ? Procedure: BILATERAL BREAST RECONSTRUCTION WITH PLACEMENT OF TISSUE EXPANDER AND FLEX HD (ACELLULAR HYDRATED DERMIS);  Surgeon: [name], MD;  Location: [place];  Service: Plastics;  Laterality: Bilateral;  ? COSMETIC SURGERY    ? DILATION AND CURETTAGE OF UTERUS    ? HEMORRHOID SURGERY    ? IR IMAGING GUIDED PORT INSERTION  06/15/2020  ? LIPOSUCTION WITH LIPOFILLING Bilateral 09/22/2021  ? Procedure: Fat grafting bilateral breast;  Surgeon: [name], MD;  Location: [place];  Service: Plastics;  Laterality:  Bilateral;  ? MASS EXCISION Bilateral 04/25/2021  ? Procedure: excision of bilateral axillary breast tissue;  Surgeon: [name], MD;  Location: [place];  Service: Plastics;  Laterality: Bilateral;  ? PORT-A-CATH REMOVAL Right 11/07/2020  ? Procedure: REMOVAL PORT-A-CATH;  Surgeon: [name], MD;  Location: [place];  Service: General;  Laterality: Right;  ? REMOVAL OF BILATERAL TISSUE EXPANDERS WITH PLACEMENT OF BILATERAL BREAST IMPLANTS Bilateral 04/25/2021  ? Procedure: REMOVAL OF BILATERAL TISSUE EXPANDERS WITH PLACEMENT OF BILATERAL BREAST IMPLANTS;  Surgeon: [name], MD;  Location: [place];  Service: Plastics;  Laterality: Bilateral;  ? ROBOTIC ASSISTED TOTAL HYSTERECTOMY WITH BILATERAL SALPINGO OOPHERECTOMY Bilateral 03/14/2021  ? Procedure: XI ROBOTIC ASSISTED TOTAL HYSTERECTOMY WITH BILATERAL SALPINGO OOPHORECTOMY;  Surgeon: Everitt Amber, MD;  [facility]: [facility];  Service: Gynecology;  Laterality: Bilateral;  ? TOTAL MASTECTOMY Bilateral 11/07/2020  ? Procedure: BILATERAL MASTECTOMY;  Surgeon: [name], MD;  Location: [place];  Service: General;  Laterality: Bilateral;  ? ?Patient Active Problem List  ? Diagnosis Date Noted  ? IDA (iron deficiency anemia) 07/18/2021  ? Cholelithiasis 03/14/2021  ? Peripheral neuropathy due to chemotherapy ([place]) 10/20/2020  ? Morbid obesity with BMI of 40.0-44.9, adult ([place]) 07/19/2020  ? BRCA1 gene mutation positive 07/04/2020  ? Genetic testing 06/28/2020  ? Family history of non-Hodgkin's lymphoma 06/01/2020  ? Malignant neoplasm of upper-outer quadrant of left breast in female, estrogen receptor positive ([place])  05/26/2020  ? ? ?REFERRING DIAG: left breast cancer at risk for lymphedema ? ?THERAPY DIAG:  ?Aftercare following surgery for neoplasm ? ?PERTINENT HISTORY: Patient was diagnosed on 05/10/2020 with left grade III invasive ductal carcinoma breast cancer. She had a bilateral mastectomy and left sentinel node  biopsy (1 negative node) on 11/07/2020 It is ER positive, PR negative, and HER2 positive with a Ki67 of 50%. She underwent plastic surgery to her left arm at age 2 when her arm was severely cut in a glass door; bil breast fat grafting and excision of excess Rt axillary tissue ? ?PRECAUTIONS: left UE Lymphedema risk, None ? ?SUBJECTIVE: Pt returns for her 3 month L-Dex screen.  ? ?PAIN:  ?Are you having pain? No ? ?SOZO SCREENING: ?Patient was assessed today using the SOZO machine to determine the lymphedema index score. This was compared to her baseline score. It was determined that she is within the recommended range when compared to her baseline and no further action is needed at this time. She will continue SOZO screenings. These are done every 3 months for 2 years post operatively followed by every 6 months for 2 years, and then annually. ? ? ? ?Otelia Limes, PTA ?10/02/2021, 9:41 AM ? ?  ? ?

## 2021-10-09 ENCOUNTER — Ambulatory Visit: Payer: Medicaid Other | Admitting: Family Medicine

## 2021-10-13 ENCOUNTER — Other Ambulatory Visit: Payer: Self-pay

## 2021-10-13 DIAGNOSIS — C50412 Malignant neoplasm of upper-outer quadrant of left female breast: Secondary | ICD-10-CM

## 2021-10-13 NOTE — Progress Notes (Signed)
Lab orders places ?

## 2021-10-17 ENCOUNTER — Encounter: Payer: Self-pay | Admitting: Hematology and Oncology

## 2021-10-17 ENCOUNTER — Inpatient Hospital Stay: Payer: Medicaid Other | Attending: Oncology

## 2021-10-17 ENCOUNTER — Other Ambulatory Visit: Payer: Self-pay

## 2021-10-17 ENCOUNTER — Inpatient Hospital Stay (HOSPITAL_BASED_OUTPATIENT_CLINIC_OR_DEPARTMENT_OTHER): Payer: Medicaid Other | Admitting: Hematology and Oncology

## 2021-10-17 VITALS — BP 125/101 | HR 84 | Temp 97.7°F | Resp 18 | Ht 71.0 in | Wt 315.2 lb

## 2021-10-17 DIAGNOSIS — C50412 Malignant neoplasm of upper-outer quadrant of left female breast: Secondary | ICD-10-CM | POA: Insufficient documentation

## 2021-10-17 DIAGNOSIS — Z170 Estrogen receptor positive status [ER+]: Secondary | ICD-10-CM

## 2021-10-17 DIAGNOSIS — Z171 Estrogen receptor negative status [ER-]: Secondary | ICD-10-CM | POA: Diagnosis not present

## 2021-10-17 DIAGNOSIS — Z1501 Genetic susceptibility to malignant neoplasm of breast: Secondary | ICD-10-CM | POA: Insufficient documentation

## 2021-10-17 DIAGNOSIS — Z1509 Genetic susceptibility to other malignant neoplasm: Secondary | ICD-10-CM

## 2021-10-17 LAB — CBC WITH DIFFERENTIAL (CANCER CENTER ONLY)
Abs Immature Granulocytes: 0.02 10*3/uL (ref 0.00–0.07)
Basophils Absolute: 0.1 10*3/uL (ref 0.0–0.1)
Basophils Relative: 1 %
Eosinophils Absolute: 0.2 10*3/uL (ref 0.0–0.5)
Eosinophils Relative: 3 %
HCT: 42.7 % (ref 36.0–46.0)
Hemoglobin: 13.5 g/dL (ref 12.0–15.0)
Immature Granulocytes: 0 %
Lymphocytes Relative: 47 %
Lymphs Abs: 3.4 10*3/uL (ref 0.7–4.0)
MCH: 27 pg (ref 26.0–34.0)
MCHC: 31.6 g/dL (ref 30.0–36.0)
MCV: 85.4 fL (ref 80.0–100.0)
Monocytes Absolute: 0.4 10*3/uL (ref 0.1–1.0)
Monocytes Relative: 5 %
Neutro Abs: 3.3 10*3/uL (ref 1.7–7.7)
Neutrophils Relative %: 44 %
Platelet Count: 290 10*3/uL (ref 150–400)
RBC: 5 MIL/uL (ref 3.87–5.11)
RDW: 14.9 % (ref 11.5–15.5)
WBC Count: 7.4 10*3/uL (ref 4.0–10.5)
nRBC: 0 % (ref 0.0–0.2)

## 2021-10-17 LAB — CMP (CANCER CENTER ONLY)
ALT: 21 U/L (ref 0–44)
AST: 16 U/L (ref 15–41)
Albumin: 4 g/dL (ref 3.5–5.0)
Alkaline Phosphatase: 99 U/L (ref 38–126)
Anion gap: 7 (ref 5–15)
BUN: 11 mg/dL (ref 6–20)
CO2: 24 mmol/L (ref 22–32)
Calcium: 9.1 mg/dL (ref 8.9–10.3)
Chloride: 110 mmol/L (ref 98–111)
Creatinine: 0.71 mg/dL (ref 0.44–1.00)
GFR, Estimated: >60 mL/min (ref >60)
Glucose, Bld: 124 mg/dL — ABNORMAL HIGH (ref 70–99)
Potassium: 4.2 mmol/L (ref 3.5–5.1)
Sodium: 141 mmol/L (ref 135–145)
Total Bilirubin: 0.3 mg/dL (ref 0.3–1.2)
Total Protein: 6.8 g/dL (ref 6.5–8.1)

## 2021-10-17 NOTE — Progress Notes (Signed)
?[place]  ?[phone] [phone]  ? ? ?ID: [name] DOB: Jul 01, 1975  MR#: [id number]  UDJ#:[id number] ? ?Patient Care Team: ?Windell Hummingbird, Hershal Coria as PCP - General (Physician Assistant) ?[name], RN as Oncology Nurse Navigator ?[name], RN as Oncology Nurse Navigator ?[name], MD as Consulting Physician (General Surgery) ?Magrinat, [name], MD (Inactive) as Consulting Physician (Oncology) ?[name], MD as Consulting Physician (Radiation Oncology) ?[name], MD as Consulting Physician (Plastic Surgery) ?Benay Pike, MD ?OTHER MD: ? ?CHIEF COMPLAINT: estrogen receptor negative, Her2 positive breast cancer (s/p bilateral mastectomies); BRCA1+ ? ?CURRENT TREATMENT: None ? ?INTERVAL HISTORY: ?Natalye returns today for follow up and treatment of her estrogen receptor negative, Her2 positive breast cancer accompanied by her daughter (who incidentally is BRCA negative) ? ?During her last visit with me, recommended considering olaparib adjuvant since her final pathology was triple negative.  We also discussed about considering tamoxifen as antiestrogen therapy since her initial pathology showed weak ER positive tumor.  She agreed to try tamoxifen but apparently she has not been taking anything.   ?She is now undergoing breast reconstruction, she appears to be satisfied with the result so far.  She denies any complaints.  She says she is worried about having another cancer.  She is not aware of having any family history of pancreatic cancer.  Rest of the pertinent 10 point ROS reviewed and negative ? ?REVIEW OF SYSTEMS: ? A detailed review of systems today was otherwise stable. ?  ? COVID 19 VACCINATION STATUS: Status post vaccine x2, no booster as of February 2022 ? ? ?HISTORY OF CURRENT ILLNESS: ?From the original intake note:  ? ?Claudette Stapler Sukup presented with a palpable lump in the outer left breast, which she initially felt approximately  6 months prior. She waited to seek medical attention because she had no insurance.  Eventually she enrolled in the [facility] program and underwent bilateral diagnostic mammography with tomography and left breast ultrasonography at [facility] on 05/10/2020 showing: breast density category B; vaguely palpable 2.5 cm mass in left breast at 1:30; no pathologic left axillary lymphadenopathy or evidence of right breast malignancy. ? ?Accordingly on 05/18/2020 she proceeded to biopsy of the left breast area in question. The pathology from this procedure ([id number]) showed: invasive ductal carcinoma, grade 3. Prognostic indicators significant for: estrogen receptor, 40% positive with weak staining intensity and progesterone receptor, 0% negative. Proliferation marker Ki67 at 50%. HER2 positive by immunohistochemistry (3+). ? ?The patient's subsequent history is as detailed below. ? ? ?PAST MEDICAL HISTORY: ?Past Medical History:  ?Diagnosis Date  ? Arthritis   ? Breast cancer ([place])   ? Family history of non-Hodgkin's lymphoma 06/01/2020  ? GERD (gastroesophageal reflux disease)   ? Hemorrhoids   ? Migraines   ? PCOS (polycystic ovarian syndrome)   ? PONV (postoperative nausea and vomiting)   ? ? ?PAST SURGICAL HISTORY: ?Past Surgical History:  ?Procedure Laterality Date  ? AXILLARY SENTINEL NODE BIOPSY Left 11/07/2020  ? Procedure: LEFT AXILLARY SENTINEL NODE BIOPSY;  Surgeon: [name], MD;  Location: [place];  Service: General;  Laterality: Left;  ? BREAST CYST EXCISION Right 09/22/2021  ? Procedure: Excision of right axillary excess soft tissue.;  Surgeon: [name], MD;  Location: [place];  Service: Plastics;  Laterality: Right;  ? BREAST RECONSTRUCTION WITH PLACEMENT OF TISSUE EXPANDER AND FLEX HD (ACELLULAR HYDRATED DERMIS) Bilateral 11/07/2020  ? Procedure: BILATERAL BREAST RECONSTRUCTION WITH PLACEMENT  OF TISSUE EXPANDER AND FLEX HD (ACELLULAR HYDRATED DERMIS);  Surgeon: [name], MD;  Location: [place];  Service: Plastics;  Laterality: Bilateral;  ? COSMETIC SURGERY    ? DILATION AND CURETTAGE OF UTERUS    ? HEMORRHOID SURGERY    ? IR IMAGING GUIDED PORT INSERTION  06/15/2020  ? LIPOSUCTION WITH LIPOFILLING Bilateral 09/22/2021  ? Procedure: Fat grafting bilateral breast;  Surgeon: [name], MD;  Location: [place];  Service: Plastics;  Laterality: Bilateral;  ? MASS EXCISION Bilateral 04/25/2021  ? Procedure: excision of bilateral axillary breast tissue;  Surgeon: [name], MD;  Location: [place];  Service: Plastics;  Laterality: Bilateral;  ? PORT-A-CATH REMOVAL Right 11/07/2020  ? Procedure: REMOVAL PORT-A-CATH;  Surgeon: [name], MD;  Location: [place];  Service: General;  Laterality: Right;  ? REMOVAL OF BILATERAL TISSUE EXPANDERS WITH PLACEMENT OF BILATERAL BREAST IMPLANTS Bilateral 04/25/2021  ? Procedure: REMOVAL OF BILATERAL TISSUE EXPANDERS WITH PLACEMENT OF BILATERAL BREAST IMPLANTS;  Surgeon: [name], MD;  Location: [place];  Service: Plastics;  Laterality: Bilateral;  ? ROBOTIC ASSISTED TOTAL HYSTERECTOMY WITH BILATERAL SALPINGO OOPHERECTOMY Bilateral 03/14/2021  ? Procedure: XI ROBOTIC ASSISTED TOTAL HYSTERECTOMY WITH BILATERAL SALPINGO OOPHORECTOMY;  Surgeon: Everitt Amber, MD;  [facility];  Service: Gynecology;  Laterality: Bilateral;  ? TOTAL MASTECTOMY Bilateral 11/07/2020  ? Procedure: BILATERAL MASTECTOMY;  Surgeon: [name], MD;  Location: [place];  Service: General;  Laterality: Bilateral;  ? ? ?FAMILY HISTORY: ?Family History  ?Problem Relation Age of Onset  ? Hypertension Mother   ? Diabetes Mother   ? Cancer Mother 41  ?     unknown primary  ? Other Mother   ?     brain tumor; dx 74s  ? Non-Hodgkin's lymphoma Brother 66  ? Other Maternal Uncle 1  ?     brain tumor  ? Breast cancer Other   ?     MGM's niece; dx late 2s  ? Pancreatic cancer Neg Hx   ? Colon cancer Neg Hx   ?  Endometrial cancer Neg Hx   ? Prostate cancer Neg Hx   ? Ovarian cancer Neg Hx   ? She has no information on her father. Her mother died at age 73 from stage IV cancer. The primary cancer was unknown, but it was not breast.  Orie has two half brothers (through her mother), one of which was diagnosed with non-Hodgkin's lymphoma. There is no family history of breast, ovarian, or prostate cancer to her knowledge. // Patient is BRCA1 positive. Daughter tested negative. ? ? ?GYNECOLOGIC HISTORY:  ?Patient's last menstrual period was 09/29/2020 (approximate). ?Menarche: 46 years old ?Age at first live birth: 46 years old ?GX P 1 ?LMP 05/02/2020 ?Contraceptive: never used ?HRT n/a  ?Hysterectomy? no ?BSO? no ? ? ?SOCIAL HISTORY: (updated 05/2020)  ?Winefred is currently working as a Building control surveyor (Administrator, arts) with [employer] She is single. Her significant other [name] is a Freight forwarder. She lives at home with [name], her daughter [name],  [name]'s daughter [name] and her 20-monthold daughter [name] Daughter [name] age 45 is a bChief Operating Officerat [employer] [employer] ?  ? ADVANCED DIRECTIVES: Completed and notarized 09/07/2020.  She has named her aunt TMarveen Reeksas healthcare power of attorney ? ? ?HEALTH MAINTENANCE: ?Social History  ? ?Tobacco Use  ? Smoking status: Former  ?  Packs/day: 0.50  ?  Years: 10.00  ?  Pack years: 5.00  ?  Types: Cigarettes  ?  Quit date: 08/23/2020  ?  Years since quitting: 1.1  ? Smokeless tobacco: Never  ?Vaping Use  ? Vaping Use: Never used  ?Substance Use Topics  ? Alcohol use: Not Currently  ?  Comment: rare  ? Drug use: Yes  ?  Types: Marijuana  ?  Comment: last time=yesterday  ? ? ? Colonoscopy: 2018 ? PAP: 2018 ? Bone density: n/a (age) ?  ?Allergies  ?Allergen Reactions  ? Carboplatin Hives and Itching  ?  Carboplatin reaction after dose #5 requiring Benadryl, Solumedrol, epinephrine, Claritin.  ? Other Hives  ?  Baking Soda  ? Wound  Dressing Adhesive Hives  ?  Rips skin, prefers paper tape  ? ? ?Current Outpatient Medications  ?Medication Sig Dispense Refill  ? acetaminophen (TYLENOL) 650 MG CR tablet Take 650-1,300 mg by mouth e

## 2021-11-07 ENCOUNTER — Emergency Department (HOSPITAL_BASED_OUTPATIENT_CLINIC_OR_DEPARTMENT_OTHER): Payer: Medicaid Other

## 2021-11-07 ENCOUNTER — Inpatient Hospital Stay (HOSPITAL_BASED_OUTPATIENT_CLINIC_OR_DEPARTMENT_OTHER)
Admission: EM | Admit: 2021-11-07 | Discharge: 2021-11-17 | DRG: 025 | Disposition: A | Discharge status: Rehabilitation Facility | Payer: Medicaid Other | Attending: Neurosurgery | Admitting: Neurosurgery

## 2021-11-07 ENCOUNTER — Encounter (HOSPITAL_BASED_OUTPATIENT_CLINIC_OR_DEPARTMENT_OTHER): Payer: Self-pay

## 2021-11-07 ENCOUNTER — Other Ambulatory Visit: Payer: Self-pay

## 2021-11-07 ENCOUNTER — Observation Stay (HOSPITAL_COMMUNITY): Payer: Medicaid Other

## 2021-11-07 DIAGNOSIS — Z90710 Acquired absence of both cervix and uterus: Secondary | ICD-10-CM

## 2021-11-07 DIAGNOSIS — Z888 Allergy status to other drugs, medicaments and biological substances status: Secondary | ICD-10-CM

## 2021-11-07 DIAGNOSIS — G936 Cerebral edema: Secondary | ICD-10-CM | POA: Diagnosis present

## 2021-11-07 DIAGNOSIS — K219 Gastro-esophageal reflux disease without esophagitis: Secondary | ICD-10-CM | POA: Diagnosis present

## 2021-11-07 DIAGNOSIS — F40240 Claustrophobia: Secondary | ICD-10-CM | POA: Diagnosis not present

## 2021-11-07 DIAGNOSIS — Z9221 Personal history of antineoplastic chemotherapy: Secondary | ICD-10-CM | POA: Diagnosis not present

## 2021-11-07 DIAGNOSIS — Z87891 Personal history of nicotine dependence: Secondary | ICD-10-CM | POA: Diagnosis not present

## 2021-11-07 DIAGNOSIS — Z79899 Other long term (current) drug therapy: Secondary | ICD-10-CM | POA: Diagnosis not present

## 2021-11-07 DIAGNOSIS — Z20822 Contact with and (suspected) exposure to covid-19: Secondary | ICD-10-CM | POA: Diagnosis not present

## 2021-11-07 DIAGNOSIS — C50412 Malignant neoplasm of upper-outer quadrant of left female breast: Secondary | ICD-10-CM

## 2021-11-07 DIAGNOSIS — G9389 Other specified disorders of brain: Secondary | ICD-10-CM | POA: Diagnosis present

## 2021-11-07 DIAGNOSIS — Z807 Family history of other malignant neoplasms of lymphoid, hematopoietic and related tissues: Secondary | ICD-10-CM

## 2021-11-07 DIAGNOSIS — Z171 Estrogen receptor negative status [ER-]: Secondary | ICD-10-CM

## 2021-11-07 DIAGNOSIS — Z853 Personal history of malignant neoplasm of breast: Secondary | ICD-10-CM

## 2021-11-07 DIAGNOSIS — Z9013 Acquired absence of bilateral breasts and nipples: Secondary | ICD-10-CM

## 2021-11-07 DIAGNOSIS — Z1501 Genetic susceptibility to malignant neoplasm of breast: Secondary | ICD-10-CM | POA: Diagnosis not present

## 2021-11-07 DIAGNOSIS — Z8249 Family history of ischemic heart disease and other diseases of the circulatory system: Secondary | ICD-10-CM

## 2021-11-07 DIAGNOSIS — Z6841 Body Mass Index (BMI) 40.0 and over, adult: Secondary | ICD-10-CM

## 2021-11-07 DIAGNOSIS — R531 Weakness: Secondary | ICD-10-CM | POA: Diagnosis not present

## 2021-11-07 DIAGNOSIS — G40209 Localization-related (focal) (partial) symptomatic epilepsy and epileptic syndromes with complex partial seizures, not intractable, without status epilepticus: Secondary | ICD-10-CM | POA: Diagnosis present

## 2021-11-07 DIAGNOSIS — Z170 Estrogen receptor positive status [ER+]: Secondary | ICD-10-CM

## 2021-11-07 DIAGNOSIS — Z833 Family history of diabetes mellitus: Secondary | ICD-10-CM

## 2021-11-07 DIAGNOSIS — C7931 Secondary malignant neoplasm of brain: Secondary | ICD-10-CM | POA: Diagnosis not present

## 2021-11-07 DIAGNOSIS — Z9882 Breast implant status: Secondary | ICD-10-CM | POA: Diagnosis not present

## 2021-11-07 DIAGNOSIS — Z803 Family history of malignant neoplasm of breast: Secondary | ICD-10-CM

## 2021-11-07 DIAGNOSIS — D496 Neoplasm of unspecified behavior of brain: Secondary | ICD-10-CM | POA: Diagnosis present

## 2021-11-07 LAB — CBC
HCT: 43.3 % (ref 36.0–46.0)
Hemoglobin: 15 g/dL (ref 12.0–15.0)
MCH: 30.9 pg (ref 26.0–34.0)
MCHC: 34.6 g/dL (ref 30.0–36.0)
MCV: 89.1 fL (ref 80.0–100.0)
Platelets: 240 10*3/uL (ref 150–400)
RBC: 4.86 MIL/uL (ref 3.87–5.11)
RDW: 13.2 % (ref 11.5–15.5)
WBC: 6.4 10*3/uL (ref 4.0–10.5)
nRBC: 0 % (ref 0.0–0.2)

## 2021-11-07 LAB — DIFFERENTIAL
Abs Immature Granulocytes: 0.02 10*3/uL (ref 0.00–0.07)
Basophils Absolute: 0 10*3/uL (ref 0.0–0.1)
Basophils Relative: 1 %
Eosinophils Absolute: 0.1 10*3/uL (ref 0.0–0.5)
Eosinophils Relative: 2 %
Immature Granulocytes: 0 %
Lymphocytes Relative: 37 %
Lymphs Abs: 2.4 10*3/uL (ref 0.7–4.0)
Monocytes Absolute: 0.5 10*3/uL (ref 0.1–1.0)
Monocytes Relative: 7 %
Neutro Abs: 3.4 10*3/uL (ref 1.7–7.7)
Neutrophils Relative %: 53 %

## 2021-11-07 LAB — COMPREHENSIVE METABOLIC PANEL
ALT: 47 U/L — ABNORMAL HIGH (ref 0–44)
AST: 31 U/L (ref 15–41)
Albumin: 3.9 g/dL (ref 3.5–5.0)
Alkaline Phosphatase: 89 U/L (ref 38–126)
Anion gap: 7 (ref 5–15)
BUN: 20 mg/dL (ref 6–20)
CO2: 22 mmol/L (ref 22–32)
Calcium: 8.9 mg/dL (ref 8.9–10.3)
Chloride: 109 mmol/L (ref 98–111)
Creatinine, Ser: 0.96 mg/dL (ref 0.44–1.00)
GFR, Estimated: >60 mL/min (ref >60)
Glucose, Bld: 131 mg/dL — ABNORMAL HIGH (ref 70–99)
Potassium: 4 mmol/L (ref 3.5–5.1)
Sodium: 138 mmol/L (ref 135–145)
Total Bilirubin: 0.5 mg/dL (ref 0.3–1.2)
Total Protein: 7.2 g/dL (ref 6.5–8.1)

## 2021-11-07 LAB — URINALYSIS, ROUTINE W REFLEX MICROSCOPIC
Bilirubin Urine: NEGATIVE
Glucose, UA: NEGATIVE mg/dL
Hgb urine dipstick: NEGATIVE
Ketones, ur: NEGATIVE mg/dL
Leukocytes,Ua: NEGATIVE
Nitrite: NEGATIVE
Protein, ur: NEGATIVE mg/dL
Specific Gravity, Urine: 1.015 (ref 1.005–1.030)
pH: 6 (ref 5.0–8.0)

## 2021-11-07 LAB — APTT: aPTT: 26 seconds (ref 24–36)

## 2021-11-07 LAB — RESP PANEL BY RT-PCR (FLU A&B, COVID) ARPGX2
Influenza A by PCR: NEGATIVE
Influenza B by PCR: NEGATIVE
SARS Coronavirus 2 by RT PCR: NEGATIVE

## 2021-11-07 LAB — RAPID URINE DRUG SCREEN, HOSP PERFORMED
Amphetamines: NOT DETECTED
Barbiturates: NOT DETECTED
Benzodiazepines: NOT DETECTED
Cocaine: NOT DETECTED
Opiates: NOT DETECTED
Tetrahydrocannabinol: POSITIVE — AB

## 2021-11-07 LAB — PROTIME-INR
INR: 1 (ref 0.8–1.2)
Prothrombin Time: 12.6 seconds (ref 11.4–15.2)

## 2021-11-07 LAB — ETHANOL: Alcohol, Ethyl (B): <10 mg/dL (ref <10)

## 2021-11-07 LAB — PREGNANCY, URINE: Preg Test, Ur: NEGATIVE

## 2021-11-07 MED ORDER — ACETAMINOPHEN 325 MG PO TABS
650.0000 mg | ORAL_TABLET | Freq: Four times a day (QID) | ORAL | Status: DC | PRN
  Administered 2021-11-08 – 2021-11-15 (×3): 650 mg via ORAL
  Filled 2021-11-07 (×3): qty 2

## 2021-11-07 MED ORDER — ONDANSETRON HCL 4 MG/2ML IJ SOLN
4.0000 mg | Freq: Four times a day (QID) | INTRAMUSCULAR | Status: DC | PRN
  Administered 2021-11-11 – 2021-11-13 (×3): 4 mg via INTRAVENOUS
  Filled 2021-11-07 (×3): qty 2

## 2021-11-07 MED ORDER — DIPHENHYDRAMINE HCL 50 MG/ML IJ SOLN
25.0000 mg | Freq: Once | INTRAMUSCULAR | Status: AC
  Administered 2021-11-08: 25 mg via INTRAVENOUS
  Filled 2021-11-07: qty 1

## 2021-11-07 MED ORDER — ACETAMINOPHEN 650 MG RE SUPP: 650.0000 mg | Freq: Four times a day (QID) | RECTAL | Status: DC | PRN

## 2021-11-07 MED ORDER — GADOBUTROL 1 MMOL/ML IV SOLN
10.0000 mL | Freq: Once | INTRAVENOUS | Status: AC | PRN
  Administered 2021-11-07: 10 mL via INTRAVENOUS

## 2021-11-07 MED ORDER — SENNOSIDES-DOCUSATE SODIUM 8.6-50 MG PO TABS
1.0000 | ORAL_TABLET | Freq: Every evening | ORAL | Status: DC | PRN
  Administered 2021-11-11 – 2021-11-17 (×2): 1 via ORAL
  Filled 2021-11-07 (×2): qty 1

## 2021-11-07 MED ORDER — DEXAMETHASONE SODIUM PHOSPHATE 10 MG/ML IJ SOLN: 10.0000 mg | Freq: Once | INTRAMUSCULAR | Status: DC

## 2021-11-07 MED ORDER — PROCHLORPERAZINE EDISYLATE 10 MG/2ML IJ SOLN
10.0000 mg | Freq: Once | INTRAMUSCULAR | Status: AC
  Administered 2021-11-08: 10 mg via INTRAVENOUS
  Filled 2021-11-07: qty 2

## 2021-11-07 MED ORDER — LEVETIRACETAM IN NACL 500 MG/100ML IV SOLN
500.0000 mg | Freq: Two times a day (BID) | INTRAVENOUS | Status: DC
  Administered 2021-11-07 – 2021-11-08 (×3): 500 mg via INTRAVENOUS
  Filled 2021-11-07 (×4): qty 100

## 2021-11-07 MED ORDER — LORAZEPAM 2 MG/ML IJ SOLN
1.0000 mg | Freq: Once | INTRAMUSCULAR | Status: AC | PRN
  Administered 2021-11-07: 1 mg via INTRAVENOUS
  Filled 2021-11-07: qty 1

## 2021-11-07 MED ORDER — DEXAMETHASONE 4 MG PO TABS
4.0000 mg | ORAL_TABLET | Freq: Four times a day (QID) | ORAL | Status: DC
  Administered 2021-11-07 – 2021-11-17 (×39): 4 mg via ORAL
  Filled 2021-11-07 (×38): qty 1

## 2021-11-07 MED ORDER — ONDANSETRON HCL 4 MG PO TABS
4.0000 mg | ORAL_TABLET | Freq: Four times a day (QID) | ORAL | Status: DC | PRN
  Administered 2021-11-10: 4 mg via ORAL
  Filled 2021-11-07: qty 1

## 2021-11-07 MED ORDER — ACETAMINOPHEN 325 MG PO TABS
650.0000 mg | ORAL_TABLET | Freq: Once | ORAL | Status: AC
  Administered 2021-11-07: 650 mg via ORAL
  Filled 2021-11-07: qty 2

## 2021-11-07 NOTE — Assessment & Plan Note (Addendum)
We suspect her intermittent weakness were focal seizures.

## 2021-11-07 NOTE — Progress Notes (Signed)
Patient oriented to room and unit and passed yale swallow screen ?

## 2021-11-07 NOTE — ED Notes (Signed)
Presents with c/o rt arm weakness, pt states last week had a very short period of rt arm weakness which resolved very quickly, states yesterday at approx 1300hrs began having same symptoms, then this am awoke with rt leg weakness. BEFAST was negative, NIH was performed by ED MD. Follows commands without hesitation. GCS 15 ?

## 2021-11-07 NOTE — ED Notes (Signed)
Sitting on side of stretcher eating with daughter, appears in no distress at this time, using both arms equally ?

## 2021-11-07 NOTE — Progress Notes (Signed)
Plan of Care Note for accepted transfer ? ? ?Patient: Sheryl Mcmillan MRN: [id number]   DOA: 11/07/2021 ? ?Facility requesting transfer: [facility] ?Requesting Provider: Dr. Ronnald Nian ?Reason for transfer: right sided weakness ?Facility course: 46 year old with history of breast cancer s/p chemo/surgery and immunotherapy, IDA, chemo induced neuropathy who presented to ED with complaints of right arm weakness. Yesterday she started to have more persistent weakness and felt like her hand wasn't doing what it should and possibly had right sided leg weakness.  ? ?Vitals: stable ?Pertinent labs: +THC ?CT head: 2.0 x 2.1 cm parenchymal mass within the posteromedial left frontal lobe, likely reflecting a secondary (or less likely primary) ?neoplasm. Subtle hyperdensity within the mass, which may reflect ?minimal hemorrhage. Moderate surrounding vasogenic edema. Resultant ?mass effect with partial effacement of the left little ventricle and ?trace rightward midline shift. A brain MRI without and with contrast ?is recommended for further evaluation. ?In ED: neurosurgery consulted. MRI with and without contrast of brain ordered. Given keppra and decadron  ? ?Plan of care: ?The patient is accepted for admission to [place]  unit, at [facility].. Let neurosurgery know when she arrives: Dr. Kathyrn Sheriff  ? ?Author: ?Orma Flaming, MD ?11/07/2021 ? ?Check [email or web address] for on-call coverage. ? ?Nursing staff, Please call [place] number on Amion as soon as patient's arrival, so appropriate admitting provider can evaluate the pt. ?

## 2021-11-07 NOTE — ED Notes (Signed)
Family updated as to patient's status.

## 2021-11-07 NOTE — ED Notes (Signed)
Pt instructed to remain NPO until further orders ?

## 2021-11-07 NOTE — Assessment & Plan Note (Addendum)
Solid mass in the superior left hemisphere, vasogenic edema intermittent right-sided weakness, suspect focal seizures.  No seizures overnight. - Continue Decadron - Continue Keppra  - Consult neurosurgery and Radiation Oncology

## 2021-11-07 NOTE — ED Triage Notes (Signed)
Pt reports a week ago she dropped a cup out of her right hand. Yesterday she dropped a salt shaker at 100pm. Upon wakening both right arm and leg weak. Reports HA neck pain, but np pain now ?

## 2021-11-07 NOTE — ED Provider Notes (Signed)
?[facility] ?Provider Note ? ? ?CSN: [id number] ?Arrival date & time: 11/07/21  1136 ? ?  ? ?History ? ?Chief Complaint  ?Patient presents with  ? Extremity Weakness  ? ? ?Sheryl Mcmillan is a 46 y.o. female. ? ?Patient with history of breast cancer status postchemotherapy and surgery and immunotherapy, migraines who presents with right-sided weakness.  Patient states that around 1:00 yesterday she noticed weakness in her right upper extremity.  Woke up this morning at 9 AM and has had some weakness in the right lower leg but also some cramping in the right lower leg.  She has noticed that its been difficult for her to grab at things and use her right hand.  She denies any headache, numbness, speech changes, vision changes.  She states that last week she had a brief episode of right upper arm weakness for several minutes.  She denies any difficulty with swallowing or speech.  Has never had stroke before.  States family history of brainstem tumors.  As far she knows she has never had metastasis to the brain from her breast cancer in the past.  Denies any nausea or vomiting or chest pain. ? ?The history is provided by the patient.  ? ?  ? ?Home Medications ?Prior to Admission medications   ?Medication Sig Start Date End Date Taking? Authorizing Provider  ?acetaminophen (TYLENOL) 650 MG CR tablet Take 650-1,300 mg by mouth every 8 (eight) hours as needed for pain.    [provider]  ?aspirin-acetaminophen-caffeine (EXCEDRIN MIGRAINE) [phone] MG tablet Take 2 tablets by mouth every 6 (six) hours as needed for headache.    [provider]  ?COLLAGEN PO Take 1 tablet by mouth daily.    [provider]  ?Multiple Vitamins-Minerals (ONE-A-DAY WOMENS PO) Take 1 tablet by mouth daily.    [provider]  ?NON FORMULARY Apply 1 application topically 2 (two) times daily. Skinuva scar cream    [provider]  ?omeprazole (PRILOSEC) 20 MG capsule Take  40 mg by mouth daily as needed.    [provider]  ?ondansetron (ZOFRAN) 4 MG tablet Take 1 tablet (4 mg total) by mouth every 8 (eight) hours as needed for nausea or vomiting. 09/22/21   Scheeler, Carola Rhine, PA-C  ?ondansetron (ZOFRAN-ODT) 4 MG disintegrating tablet Take 1 tablet (4 mg total) by mouth every 8 (eight) hours as needed for nausea or vomiting. 07/18/21   Benay Pike, MD  ?   ? ?Allergies    ?Carboplatin, Other, and Wound dressing adhesive   ? ?Review of Systems   ?Review of Systems ? ?Physical Exam ?Updated Vital Signs ?BP (!) 144/94   Pulse 79   Temp 98.6 ?F (37 ?C)   Resp 19   Ht '5\' 11"'$  (1.803 m)   Wt (!) 143 kg   LMP 09/29/2020 (Approximate)   SpO2 99%   BMI 43.96 kg/m?  ?Physical Exam ?Vitals and nursing note reviewed.  ?Constitutional:   ?   General: She is not in acute distress. ?   Appearance: She is well-developed. She is not ill-appearing.  ?HENT:  ?   Head: Normocephalic and atraumatic.  ?   Nose: Nose normal.  ?   Mouth/Throat:  ?   Mouth: Mucous membranes are moist.  ?Eyes:  ?   Extraocular Movements: Extraocular movements intact.  ?   Conjunctiva/sclera: Conjunctivae normal.  ?   Pupils: Pupils are equal, round, and reactive to light.  ?Cardiovascular:  ?  Rate and Rhythm: Normal rate and regular rhythm.  ?   Pulses: Normal pulses.  ?   Heart sounds: Normal heart sounds. No murmur heard. ?Pulmonary:  ?   Effort: Pulmonary effort is normal. No respiratory distress.  ?   Breath sounds: Normal breath sounds.  ?Abdominal:  ?   Palpations: Abdomen is soft.  ?   Tenderness: There is no abdominal tenderness.  ?Musculoskeletal:     ?   General: No swelling.  ?   Cervical back: Normal range of motion and neck supple.  ?Skin: ?   General: Skin is warm and dry.  ?   Capillary Refill: Capillary refill takes less than 2 seconds.  ?Neurological:  ?   Mental Status: She is alert.  ?   Sensory: No sensory deficit.  ?   Motor: Weakness present.  ?   Comments: She has 4+ out of 5  strength in the right upper extremity lower extremity but otherwise strength is 5+ out of 5, normal finger-nose-finger bilaterally, no visual field deficit, cranial nerves are intact, normal sensation, normal speech  ?Psychiatric:     ?   Mood and Affect: Mood normal.  ? ? ?ED Results / Procedures / Treatments   ?Labs ?(all labs ordered are listed, but only abnormal results are displayed) ?Labs Reviewed  ?COMPREHENSIVE METABOLIC PANEL - Abnormal; Notable for the following components:  ?    Result Value  ? Glucose, Bld 131 (*)   ? ALT 47 (*)   ? All other components within normal limits  ?RAPID URINE DRUG SCREEN, HOSP PERFORMED - Abnormal; Notable for the following components:  ? Tetrahydrocannabinol POSITIVE (*)   ? All other components within normal limits  ?RESP PANEL BY RT-PCR (FLU A&B, COVID) ARPGX2  ?ETHANOL  ?PROTIME-INR  ?APTT  ?CBC  ?DIFFERENTIAL  ?URINALYSIS, ROUTINE W REFLEX MICROSCOPIC  ?PREGNANCY, URINE  ? ? ?EKG ?EKG Interpretation ? ?Date/Time:  Tuesday Nov 07 2021 11:57:06 EDT ?Ventricular Rate:  90 ?PR Interval:  168 ?QRS Duration: 83 ?QT Interval:  349 ?QTC Calculation: 427 ?R Axis:   49 ?Text Interpretation: Sinus rhythm Abnormal R-wave progression, early transition Confirmed by Lennice Sites [phone]) on 11/07/2021 12:12:30 PM ? ?Radiology ?CT HEAD WO CONTRAST ? ?Result Date: 11/07/2021 ?CLINICAL DATA:  Provided history: Neuro deficit, acute, stroke suspected; right arm and leg weakness. EXAM: CT HEAD WITHOUT CONTRAST TECHNIQUE: Contiguous axial images were obtained from the base of the skull through the vertex without intravenous contrast. RADIATION DOSE REDUCTION: This exam was performed according to the departmental dose-optimization program which includes automated exposure control, adjustment of the mA and/or kV according to patient size and/or use of iterative reconstruction technique. COMPARISON:  Head CT 04/11/2011. FINDINGS: Brain: Cerebral volume is normal. 2.0 x 1.9 x 2.1 cm parenchymal mass  within the posteromedial left frontal lobe. There is subtle hyperdensity associated with the mass, which may reflect minimal hemorrhage. Moderate surrounding vasogenic edema. Resultant mass effect with partial effacement of the left lateral ventricle, and trace rightward midline shift. Partially empty sella turcica. No demarcated cortical infarct. No extra-axial fluid collection. No midline shift. Vascular: No hyperdense vessel. Skull: No acute fracture or aggressive osseous lesion. Sinuses/Orbits: No mass or acute finding within the imaged orbits. Lobular partial opacification of the left sphenoid sinus, which may reflect the presence of a mucous retention cyst, polyp and/or secretions. Impression #1 called by telephone at the time of interpretation on 11/07/2021 at 12:55 pm to provider Cerita Rabelo , who verbally acknowledged these  results. IMPRESSION: 2.0 x 2.1 cm parenchymal mass within the posteromedial left frontal lobe, likely reflecting a secondary (or less likely primary) neoplasm. Subtle hyperdensity within the mass, which may reflect minimal hemorrhage. Moderate surrounding vasogenic edema. Resultant mass effect with partial effacement of the left little ventricle and trace rightward midline shift. A brain MRI without and with contrast is recommended for further evaluation. Partially empty sella turcica. While this finding often reflects incidental anatomic variation, it can also be associated with idiopathic intracranial hypertension (pseudotumor cerebri). Left sphenoid sinus disease, as described. Electronically Signed   By: Kellie Simmering D.O.   On: 11/07/2021 12:57   ? ?Procedures ?Procedures  ? ? ?Medications Ordered in ED ?Medications  ?dexamethasone (DECADRON) tablet 4 mg (has no administration in time range)  ?levETIRAcetam (KEPPRA) IVPB 500 mg/100 mL premix (has no administration in time range)  ? ? ?ED Course/ Medical Decision Making/ A&P ?  ?                        ?Medical Decision  Making ?Amount and/or Complexity of Data Reviewed ?Labs: ordered. ?Radiology: ordered. ? ?Risk ?Prescription drug management. ?Decision regarding hospitalization. ? ? ?Ellisyn Icenhower Grillo is here with right-sided weakness.  Constance Holster

## 2021-11-07 NOTE — H&P (Signed)
?History and Physical  ? ? ?Sheryl Mcmillan WUJ:[id number] DOB: April 13, 1976 DOA: 11/07/2021 ? ?PCP: Sheryl Hummingbird, PA-C  ?Patient coming from: Home ? ?I have personally briefly reviewed patient's old medical records in [place] ? ?Chief Complaint: Right-sided weakness ? ?HPI: ?Sheryl Mcmillan is a 46 y.o. female with medical history significant for left breast cancer (ER negative, PR positive, BRCA1 positive) s/p neoadjuvant chemotherapy, bilateral mastectomy, hysterectomy and bilateral SOO who presented to the ED for evaluation of right-sided weakness. ? ?Patient states that about 1 week ago she began having intermittent weakness of her right upper extremity.  She was having issues holding cups and dropping objects.  At some Mcmillan she had difficulty moving her right arm at all.  She then began to have weakness of her right leg and difficulty ambulating.  She says that she went to work but due to her persistent symptoms she went to the ED for further evaluation. ? ?Sheryl Mcmillan ED Course  Labs/Imaging on admission: I have personally reviewed following labs and imaging studies. ? ?Initial vitals showed BP 128/64, pulse 90, RR 18, temp 98.6 ?F, SPO2 100% on room air. ? ?Labs show sodium 138, potassium 4.0, bicarb 22, BUN 20, creatinine 0.96, serum glucose 131, WBC 6.4, hemoglobin 15.0, platelets 240,000, serum ethanol <10, UDS positive for THC.  Urinalysis negative for UTI.  Urine pregnancy test negative.  SARS-CoV-2 and influenza PCR negative. ? ?CT head without contrast showed a 2.0 x 2.1 cm parenchymal mass within the posteromedial left frontal lobe.  Moderate surrounding vasogenic edema seen with resultant mass effect with partial effacement of the left ventricle and trace rightward midline shift.  Partially empty sella turcica noted. ? ?EDP discussed with on-call neurosurgery, Dr. Kathyrn Mcmillan who recommended starting on IV Decadron 4 mg every 6 hours, IV Keppra 500 mg twice daily, medical admission,  and they will see in consultation.  The hospitalist service was consulted to admit for further evaluation and management. ? ?Review of Systems: All systems reviewed and are negative except as documented in history of present illness above. ? ? ?Past Medical History:  ?Diagnosis Date  ? Arthritis   ? Breast cancer ([place])   ? Family history of non-Hodgkin's lymphoma 06/01/2020  ? GERD (gastroesophageal reflux disease)   ? Hemorrhoids   ? Migraines   ? PCOS (polycystic ovarian syndrome)   ? PONV (postoperative nausea and vomiting)   ? ? ?Past Surgical History:  ?Procedure Laterality Date  ? AXILLARY SENTINEL NODE BIOPSY Left 11/07/2020  ? Procedure: LEFT AXILLARY SENTINEL NODE BIOPSY;  Surgeon: [name], MD;  Location: [facility];  Service: General;  Laterality: Left;  ? BREAST CYST EXCISION Right 09/22/2021  ? Procedure: Excision of right axillary excess soft tissue.;  Surgeon: [name], MD;  Location: [facility];  Service: Plastics;  Laterality: Right;  ? BREAST RECONSTRUCTION WITH PLACEMENT OF TISSUE EXPANDER AND FLEX HD (ACELLULAR HYDRATED DERMIS) Bilateral 11/07/2020  ? Procedure: BILATERAL BREAST RECONSTRUCTION WITH PLACEMENT OF TISSUE EXPANDER AND FLEX HD (ACELLULAR HYDRATED DERMIS);  Surgeon: [name], MD;  Location: [facility];  Service: Plastics;  Laterality: Bilateral;  ? COSMETIC SURGERY    ? DILATION AND CURETTAGE OF UTERUS    ? HEMORRHOID SURGERY    ? IR IMAGING GUIDED PORT INSERTION  06/15/2020  ? LIPOSUCTION WITH LIPOFILLING Bilateral 09/22/2021  ? Procedure: Fat grafting bilateral breast;  Surgeon: [name], MD;  Location: [facility];  Service: Plastics;  Laterality: Bilateral;  ? MASS EXCISION Bilateral 04/25/2021  ? Procedure: excision of bilateral axillary breast tissue;  Surgeon: [name], MD;  Location: [facility];  Service: Plastics;  Laterality: Bilateral;  ? PORT-A-CATH REMOVAL Right 11/07/2020  ? Procedure: REMOVAL PORT-A-CATH;   Surgeon: [name], MD;  Location: [facility];  Service: General;  Laterality: Right;  ? REMOVAL OF BILATERAL TISSUE EXPANDERS WITH PLACEMENT OF BILATERAL BREAST IMPLANTS Bilateral 04/25/2021  ? Procedure: REMOVAL OF BILATERAL TISSUE EXPANDERS WITH PLACEMENT OF BILATERAL BREAST IMPLANTS;  Surgeon: [name], MD;  Location: [facility];  Service: Plastics;  Laterality: Bilateral;  ? ROBOTIC ASSISTED TOTAL HYSTERECTOMY WITH BILATERAL SALPINGO OOPHERECTOMY Bilateral 03/14/2021  ? Procedure: XI ROBOTIC ASSISTED TOTAL HYSTERECTOMY WITH BILATERAL SALPINGO OOPHORECTOMY;  Surgeon: Everitt Amber, MD;  Location: [facility];  Service: Gynecology;  Laterality: Bilateral;  ? TOTAL MASTECTOMY Bilateral 11/07/2020  ? Procedure: BILATERAL MASTECTOMY;  Surgeon: [name], MD;  Location: [facility];  Service: General;  Laterality: Bilateral;  ? ? ?Social History: ? reports that she quit smoking about 14 months ago. Her smoking use included cigarettes. She has a 5.00 pack-year smoking history. She has never used smokeless tobacco. She reports that she does not currently use alcohol. She reports current drug use. Drug: Marijuana. ? ?Allergies  ?Allergen Reactions  ? Carboplatin Hives and Itching  ?  Carboplatin reaction after dose #5 requiring Benadryl, Solumedrol, epinephrine, Claritin.  ? Other Hives  ?  Baking Soda  ? Wound Dressing Adhesive Hives  ?  Rips skin, prefers paper tape  ? ? ?Family History  ?Problem Relation Age of Onset  ? Hypertension Mother   ? Diabetes Mother   ? Cancer Mother 76  ?     unknown primary  ? Other Mother   ?     brain tumor; dx 54s  ? Non-Hodgkin's lymphoma Brother 34  ? Other Maternal Uncle 70  ?     brain tumor  ? Breast cancer Other   ?     MGM's niece; dx late 40s  ? Pancreatic cancer Neg Hx   ? Colon cancer Neg Hx   ? Endometrial cancer Neg Hx   ? Prostate cancer Neg Hx   ? Ovarian cancer Neg Hx   ? ? ? ?Prior to Admission medications   ?Medication Sig Start Date End Date Taking?  Authorizing Provider  ?acetaminophen (TYLENOL) 650 MG CR tablet Take 650-1,300 mg by mouth every 8 (eight) hours as needed for pain.    [provider]  ?aspirin-acetaminophen-caffeine (EXCEDRIN MIGRAINE) [phone] MG tablet Take 2 tablets by mouth every 6 (six) hours as needed for headache.    [provider]  ?COLLAGEN PO Take 1 tablet by mouth daily.    [provider]  ?Multiple Vitamins-Minerals (ONE-A-DAY WOMENS PO) Take 1 tablet by mouth daily.    [provider]  ?NON FORMULARY Apply 1 application topically 2 (two) times daily. Skinuva scar cream    [provider]  ?omeprazole (PRILOSEC) 20 MG capsule Take 40 mg by mouth daily as needed.    [provider]  ?ondansetron (ZOFRAN) 4 MG tablet Take 1 tablet (4 mg total) by mouth every 8 (eight) hours as needed for nausea or vomiting. 09/22/21   Scheeler, Carola Rhine, PA-C  ?ondansetron (ZOFRAN-ODT) 4 MG disintegrating tablet Take 1 tablet (4 mg total) by mouth every 8 (eight) hours as needed for nausea or vomiting. 07/18/21   Benay Pike, MD  ? ? ?  Physical Exam: ?Vitals:  ? 11/07/21 1725 11/07/21 1852 11/07/21 2000 11/08/21 0000  ?BP: (!) 134/96 (!) 148/87 (!) 167/107 (!) 153/104  ?Pulse: 83 87 81 98  ?Resp: 18 (!) 21 18 18   ?Temp: 98 ?F (36.7 ?C) 98.5 ?F (36.9 ?C) 99.3 ?F (37.4 ?C) 99 ?F (37.2 ?C)  ?TempSrc: Oral Oral Oral Oral  ?SpO2: 99% 98% 97% 97%  ?Weight:      ?Height:      ? ?Constitutional: Morbidly obese woman resting supine in bed, NAD, calm, comfortable ?Eyes: EOMI, lids and conjunctivae normal ?ENMT: Mucous membranes are moist. Posterior pharynx clear of any exudate or lesions.Normal dentition.  ?Neck: normal, supple, no masses. ?Respiratory: clear to auscultation bilaterally, no wheezing, no crackles. Normal respiratory effort. No accessory muscle use.  ?Cardiovascular: Regular rate and rhythm, no murmurs / rubs / gallops. No extremity edema. 2+ pedal pulses. ?Abdomen: no tenderness, no  masses palpated. No hepatosplenomegaly. Bowel sounds positive.  ?Musculoskeletal: no clubbing / cyanosis. No joint deformity upper and lower extremities. Good ROM, no contractures. Normal muscle tone.  ?Sk

## 2021-11-07 NOTE — ED Notes (Signed)
EDP at bedside  

## 2021-11-08 ENCOUNTER — Inpatient Hospital Stay (HOSPITAL_COMMUNITY): Payer: Medicaid Other

## 2021-11-08 ENCOUNTER — Ambulatory Visit
Admit: 2021-11-08 | Discharge: 2021-11-08 | Disposition: A | Discharge status: Home / Self Care | Payer: Medicaid Other | Attending: Radiation Oncology | Admitting: Radiation Oncology

## 2021-11-08 ENCOUNTER — Other Ambulatory Visit: Payer: Self-pay | Admitting: Neurosurgery

## 2021-11-08 ENCOUNTER — Encounter: Payer: Self-pay | Admitting: Radiation Therapy

## 2021-11-08 DIAGNOSIS — G40209 Localization-related (focal) (partial) symptomatic epilepsy and epileptic syndromes with complex partial seizures, not intractable, without status epilepticus: Secondary | ICD-10-CM | POA: Diagnosis present

## 2021-11-08 DIAGNOSIS — Z171 Estrogen receptor negative status [ER-]: Secondary | ICD-10-CM | POA: Diagnosis not present

## 2021-11-08 DIAGNOSIS — R030 Elevated blood-pressure reading, without diagnosis of hypertension: Secondary | ICD-10-CM | POA: Diagnosis not present

## 2021-11-08 DIAGNOSIS — C7931 Secondary malignant neoplasm of brain: Secondary | ICD-10-CM | POA: Insufficient documentation

## 2021-11-08 DIAGNOSIS — C50919 Malignant neoplasm of unspecified site of unspecified female breast: Secondary | ICD-10-CM | POA: Diagnosis not present

## 2021-11-08 DIAGNOSIS — Z9221 Personal history of antineoplastic chemotherapy: Secondary | ICD-10-CM | POA: Diagnosis not present

## 2021-11-08 DIAGNOSIS — Z9189 Other specified personal risk factors, not elsewhere classified: Secondary | ICD-10-CM | POA: Diagnosis not present

## 2021-11-08 DIAGNOSIS — K5901 Slow transit constipation: Secondary | ICD-10-CM | POA: Diagnosis not present

## 2021-11-08 DIAGNOSIS — R739 Hyperglycemia, unspecified: Secondary | ICD-10-CM | POA: Diagnosis not present

## 2021-11-08 DIAGNOSIS — Z853 Personal history of malignant neoplasm of breast: Secondary | ICD-10-CM | POA: Diagnosis not present

## 2021-11-08 DIAGNOSIS — Z20822 Contact with and (suspected) exposure to covid-19: Secondary | ICD-10-CM | POA: Diagnosis present

## 2021-11-08 DIAGNOSIS — Z8249 Family history of ischemic heart disease and other diseases of the circulatory system: Secondary | ICD-10-CM | POA: Diagnosis not present

## 2021-11-08 DIAGNOSIS — R531 Weakness: Secondary | ICD-10-CM | POA: Diagnosis present

## 2021-11-08 DIAGNOSIS — Z833 Family history of diabetes mellitus: Secondary | ICD-10-CM | POA: Diagnosis not present

## 2021-11-08 DIAGNOSIS — R000 Tachycardia, unspecified: Secondary | ICD-10-CM | POA: Diagnosis not present

## 2021-11-08 DIAGNOSIS — G936 Cerebral edema: Secondary | ICD-10-CM | POA: Diagnosis present

## 2021-11-08 DIAGNOSIS — E1169 Type 2 diabetes mellitus with other specified complication: Secondary | ICD-10-CM | POA: Diagnosis not present

## 2021-11-08 DIAGNOSIS — F40240 Claustrophobia: Secondary | ICD-10-CM | POA: Diagnosis present

## 2021-11-08 DIAGNOSIS — Z807 Family history of other malignant neoplasms of lymphoid, hematopoietic and related tissues: Secondary | ICD-10-CM | POA: Diagnosis not present

## 2021-11-08 DIAGNOSIS — Z1501 Genetic susceptibility to malignant neoplasm of breast: Secondary | ICD-10-CM | POA: Diagnosis not present

## 2021-11-08 DIAGNOSIS — D72829 Elevated white blood cell count, unspecified: Secondary | ICD-10-CM | POA: Diagnosis not present

## 2021-11-08 DIAGNOSIS — Z9013 Acquired absence of bilateral breasts and nipples: Secondary | ICD-10-CM | POA: Diagnosis not present

## 2021-11-08 DIAGNOSIS — Z170 Estrogen receptor positive status [ER+]: Secondary | ICD-10-CM

## 2021-11-08 DIAGNOSIS — Z6841 Body Mass Index (BMI) 40.0 and over, adult: Secondary | ICD-10-CM | POA: Diagnosis not present

## 2021-11-08 DIAGNOSIS — G8191 Hemiplegia, unspecified affecting right dominant side: Secondary | ICD-10-CM | POA: Diagnosis not present

## 2021-11-08 DIAGNOSIS — Z79899 Other long term (current) drug therapy: Secondary | ICD-10-CM | POA: Diagnosis not present

## 2021-11-08 DIAGNOSIS — D496 Neoplasm of unspecified behavior of brain: Secondary | ICD-10-CM | POA: Diagnosis not present

## 2021-11-08 DIAGNOSIS — Z9882 Breast implant status: Secondary | ICD-10-CM | POA: Diagnosis not present

## 2021-11-08 DIAGNOSIS — K219 Gastro-esophageal reflux disease without esophagitis: Secondary | ICD-10-CM | POA: Diagnosis present

## 2021-11-08 DIAGNOSIS — M1990 Unspecified osteoarthritis, unspecified site: Secondary | ICD-10-CM | POA: Diagnosis not present

## 2021-11-08 DIAGNOSIS — Z888 Allergy status to other drugs, medicaments and biological substances status: Secondary | ICD-10-CM | POA: Diagnosis not present

## 2021-11-08 DIAGNOSIS — Z803 Family history of malignant neoplasm of breast: Secondary | ICD-10-CM | POA: Diagnosis not present

## 2021-11-08 DIAGNOSIS — I10 Essential (primary) hypertension: Secondary | ICD-10-CM | POA: Diagnosis not present

## 2021-11-08 DIAGNOSIS — Z90710 Acquired absence of both cervix and uterus: Secondary | ICD-10-CM | POA: Diagnosis not present

## 2021-11-08 DIAGNOSIS — M7989 Other specified soft tissue disorders: Secondary | ICD-10-CM | POA: Diagnosis not present

## 2021-11-08 DIAGNOSIS — Z87891 Personal history of nicotine dependence: Secondary | ICD-10-CM | POA: Diagnosis not present

## 2021-11-08 DIAGNOSIS — E669 Obesity, unspecified: Secondary | ICD-10-CM | POA: Diagnosis not present

## 2021-11-08 DIAGNOSIS — G9389 Other specified disorders of brain: Secondary | ICD-10-CM | POA: Diagnosis present

## 2021-11-08 DIAGNOSIS — K5900 Constipation, unspecified: Secondary | ICD-10-CM | POA: Diagnosis not present

## 2021-11-08 DIAGNOSIS — G709 Myoneural disorder, unspecified: Secondary | ICD-10-CM | POA: Diagnosis not present

## 2021-11-08 LAB — CBC
HCT: 41.1 % (ref 36.0–46.0)
Hemoglobin: 13.6 g/dL (ref 12.0–15.0)
MCH: 27.8 pg (ref 26.0–34.0)
MCHC: 33.1 g/dL (ref 30.0–36.0)
MCV: 84 fL (ref 80.0–100.0)
Platelets: 329 10*3/uL (ref 150–400)
RBC: 4.89 MIL/uL (ref 3.87–5.11)
RDW: 14.4 % (ref 11.5–15.5)
WBC: 9.7 10*3/uL (ref 4.0–10.5)
nRBC: 0 % (ref 0.0–0.2)

## 2021-11-08 LAB — BASIC METABOLIC PANEL
Anion gap: 13 (ref 5–15)
BUN: 9 mg/dL (ref 6–20)
CO2: 20 mmol/L — ABNORMAL LOW (ref 22–32)
Calcium: 9.6 mg/dL (ref 8.9–10.3)
Chloride: 107 mmol/L (ref 98–111)
Creatinine, Ser: 0.73 mg/dL (ref 0.44–1.00)
GFR, Estimated: >60 mL/min (ref >60)
Glucose, Bld: 158 mg/dL — ABNORMAL HIGH (ref 70–99)
Potassium: 4.3 mmol/L (ref 3.5–5.1)
Sodium: 140 mmol/L (ref 135–145)

## 2021-11-08 LAB — HIV ANTIBODY (ROUTINE TESTING W REFLEX): HIV Screen 4th Generation wRfx: NONREACTIVE

## 2021-11-08 MED ORDER — GADOBUTROL 1 MMOL/ML IV SOLN
10.0000 mL | Freq: Once | INTRAVENOUS | Status: AC | PRN
  Administered 2021-11-08: 10 mL via INTRAVENOUS

## 2021-11-08 MED ORDER — CEFAZOLIN SODIUM-DEXTROSE 2-4 GM/100ML-% IV SOLN
2.0000 g | INTRAVENOUS | Status: AC
  Administered 2021-11-10: 3 g via INTRAVENOUS
  Filled 2021-11-08: qty 100

## 2021-11-08 MED ORDER — OXYCODONE HCL 5 MG PO TABS
5.0000 mg | ORAL_TABLET | Freq: Four times a day (QID) | ORAL | Status: DC | PRN
  Administered 2021-11-08 – 2021-11-14 (×15): 5 mg via ORAL
  Filled 2021-11-08 (×15): qty 1

## 2021-11-08 MED ORDER — CHLORHEXIDINE GLUCONATE CLOTH 2 % EX PADS
6.0000 | MEDICATED_PAD | Freq: Once | CUTANEOUS | Status: AC
  Administered 2021-11-09: 6 via TOPICAL

## 2021-11-08 MED ORDER — CHLORHEXIDINE GLUCONATE CLOTH 2 % EX PADS
6.0000 | MEDICATED_PAD | Freq: Once | CUTANEOUS | Status: AC
  Administered 2021-11-10: 6 via TOPICAL

## 2021-11-08 MED ORDER — ASPIRIN-ACETAMINOPHEN-CAFFEINE 250-250-65 MG PO TABS
1.0000 | ORAL_TABLET | Freq: Three times a day (TID) | ORAL | Status: DC | PRN
  Filled 2021-11-08: qty 1

## 2021-11-08 MED ORDER — LORAZEPAM 2 MG/ML IJ SOLN
1.0000 mg | INTRAMUSCULAR | Status: AC | PRN
  Administered 2021-11-08 – 2021-11-11 (×3): 1 mg via INTRAVENOUS
  Filled 2021-11-08 (×3): qty 1

## 2021-11-08 NOTE — Assessment & Plan Note (Signed)
Due to edema/mass.  ?- Continue Keppra ?

## 2021-11-08 NOTE — Assessment & Plan Note (Signed)
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

## 2021-11-08 NOTE — Progress Notes (Signed)
?  Progress Note ? ? ?Patient: Sheryl Mcmillan GEZ:[id number] DOB: 1975-11-27 DOA: 11/07/2021     0 ?DOS: the patient was seen and examined on 11/08/2021 at 10:10 AM ?  ? ? ? ?Brief hospital course: ?Sheryl Mcmillan is a 46 y.o. female with medical history significant for left breast cancer (ER negative, PR positive, BRCA1 positive) s/p neoadjuvant chemotherapy, bilateral mastectomy, hysterectomy and bilateral SOO who is admitted with right-sided weakness due to metastatic appearing left brain mass. ? ? ? ? ?Assessment and Plan: ?* Brain mass ?Solid mass in the superior left hemisphere, vasogenic edema intermittent right-sided weakness, suspect focal seizures. ?- Continue Decadron ?- Continue Keppra  ?-Consult neurosurgery and neuro-oncology ? ?Right sided weakness ?- PT eval ? ?Complex partial seizure (Sheryl Mcmillan) ?Due to edema/mass.  ?- Continue Keppra ? ?Cerebral edema (H[facility]) ?MRI brain shows edema, likely leading to focal seizures, treated with Decadron and Keppra. ? 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 ?Morbid obesity with BMI of 40.0-44.9, adult ([place]) ?BMI 43 ? ?Malignant neoplasm of upper-outer quadrant of left breast in female, estrogen receptor positive ([place]) ?  ? ? ? ? ? ? ? ? ? ?Subjective: Patient has no headache.  She currently has no weakness, no generalized tonic-clonic seizures, no vision changes, no speech changes. ? ? ? ? ?Physical Exam: ?Vitals:  ? 11/08/21 0000 11/08/21 0600 11/08/21 0725 11/08/21 1243  ?BP: (!) 153/104 (!) 139/96 (!) 161/120 (!) 134/96  ?Pulse: 98 97 (!) 102 98  ?Resp: $Remov'18 18 17 16  'EILccw$ ?Temp: 99 ?F (37.2 ?C) 98.4 ?F (36.9 ?C) 97.6 ?F (36.4 ?C) 98.5 ?F (36.9 ?C)  ?TempSrc: Oral Oral Oral Oral  ?SpO2: 97% 97% 99% 97%  ?Weight:      ?Height:      ? ?Adult female, sitting in the edge of the bed, no acute distress, interactive, appropriate ?RRR, no murmurs, no peripheral edema ?Respiratory rate normal, lungs clear without rales or wheezes ?Attention normal, affect normal,  judgment insight appear normal.  Strength symmetric in upper and lower extremities bilaterally, coordination appears normal. ? ?Data Reviewed: ?Neurosurgery notes reviewed, vital signs reviewed ?MRI brain report was reviewed, basic metabolic panel and complete blood count were reviewed and were normal ?Urinalysis normal ?Urine pregnancy test normal ?Alcohol level negative.  ? ? ? ? ? ?Family Communication: Daughter at the bedside ? ? ? ?Disposition: ?Status is: Inpatient ?Remains inpatient appropriate because: She has a brain tumor and partial seizures, she will need continued work-up for tumor resection, possible radiation therapy. ? ? ? ? ? ? ? ?Author: ?Edwin Dada, MD ?11/08/2021 5:43 PM ? ?For on call review [email or web address].  ? ? ?

## 2021-11-08 NOTE — Consult Note (Addendum)
Chief Complaint   Chief Complaint  Patient presents with   Extremity Weakness    History of Present Illness  Sheryl Mcmillan is a 46 y.o. female seen in consultation after presenting to the emergency department yesterday with transient episodes of primarily right arm weakness.    Of note, the patient has a history of breast cancer diagnosed about 18 months ago initially treated with neoadjuvant chemotherapy followed by bilateral mastectomy.  She  was subsequently on immunotherapy, which completed this past November.  She elected not to take maintenance tamoxifen. Her symptoms started about a week ago without any identifiable inciting event.  She initially noted weakness in her right arm which lasted for a minute or two, after which symptoms resolved and her right arm strength returned back to normal.  She has had similar episodes a few times over the last week, the last being on Monday while she was at work where she was dropping things in her right hand.  Occasionally, she has also noted some involuntary shaking of the right arm.  She has not noted similar symptoms in the left side.  On a few occasions she has also noted some subjective right leg weakness although she remains able to walk normally.    Of note, the patient denies history of hypertension, diabetes, heart disease, or stroke.   Patient is not on any blood thinners or antiplatelet agents.  She quit smoking approximately one year ago.   Past Medical History   Past Medical History:  Diagnosis Date   Arthritis    Breast cancer (HCC)    Family history of non-Hodgkin's lymphoma 06/01/2020   GERD (gastroesophageal reflux disease)    Hemorrhoids    Migraines    PCOS (polycystic ovarian syndrome)    PONV (postoperative nausea and vomiting)     Past Surgical History   Past Surgical History:  Procedure Laterality Date   AXILLARY SENTINEL NODE BIOPSY Left 11/07/2020   Procedure: LEFT AXILLARY SENTINEL NODE BIOPSY;  Surgeon:  [name], MD;  Location: [facility] OR;  Service: General;  Laterality: Left;   BREAST CYST EXCISION Right 09/22/2021   Procedure: Excision of right axillary excess soft tissue.;  Surgeon: [name], MD;  Location: [facility];  Service: Plastics;  Laterality: Right;   BREAST RECONSTRUCTION WITH PLACEMENT OF TISSUE EXPANDER AND FLEX HD (ACELLULAR HYDRATED DERMIS) Bilateral 11/07/2020   Procedure: BILATERAL BREAST RECONSTRUCTION WITH PLACEMENT OF TISSUE EXPANDER AND FLEX HD (ACELLULAR HYDRATED DERMIS);  Surgeon: [name], MD;  Location: [facility] OR;  Service: Plastics;  Laterality: Bilateral;   COSMETIC SURGERY     DILATION AND CURETTAGE OF UTERUS     HEMORRHOID SURGERY     IR IMAGING GUIDED PORT INSERTION  06/15/2020   LIPOSUCTION WITH LIPOFILLING Bilateral 09/22/2021   Procedure: Fat grafting bilateral breast;  Surgeon: [name], MD;  Location: [facility];  Service: Plastics;  Laterality: Bilateral;   MASS EXCISION Bilateral 04/25/2021   Procedure: excision of bilateral axillary breast tissue;  Surgeon: [name], MD;  Location: [facility];  Service: Plastics;  Laterality: Bilateral;   PORT-A-CATH REMOVAL Right 11/07/2020   Procedure: REMOVAL PORT-A-CATH;  Surgeon: [name], MD;  Location: [facility] OR;  Service: General;  Laterality: Right;   REMOVAL OF BILATERAL TISSUE EXPANDERS WITH PLACEMENT OF BILATERAL BREAST IMPLANTS Bilateral 04/25/2021   Procedure: REMOVAL OF BILATERAL TISSUE EXPANDERS WITH PLACEMENT OF BILATERAL BREAST IMPLANTS;  Surgeon: [name], MD;  Location:  [facility];  Service: Plastics;  Laterality: Bilateral;   ROBOTIC ASSISTED TOTAL HYSTERECTOMY WITH BILATERAL SALPINGO OOPHERECTOMY Bilateral 03/14/2021   Procedure: XI ROBOTIC ASSISTED TOTAL HYSTERECTOMY WITH BILATERAL SALPINGO OOPHORECTOMY;  Surgeon: [name], MD;  Location: [facility];  Service: Gynecology;  Laterality: Bilateral;   TOTAL  MASTECTOMY Bilateral 11/07/2020   Procedure: BILATERAL MASTECTOMY;  Surgeon: [name], MD;  Location: [facility] OR;  Service: General;  Laterality: Bilateral;    Social History   Social History   Tobacco Use   Smoking status: Former    Packs/day: 0.50    Years: 10.00    Pack years: 5.00    Types: Cigarettes    Quit date: 08/23/2020    Years since quitting: 1.2   Smokeless tobacco: Never  Vaping Use   Vaping Use: Never used  Substance Use Topics   Alcohol use: Not Currently    Comment: rare   Drug use: Yes    Types: Marijuana    Comment: last time=yesterday    Medications   Prior to Admission medications   Medication Sig Start Date End Date Taking? Authorizing Provider  acetaminophen (TYLENOL) 500 MG tablet Take 1,000 mg by mouth every 6 (six) hours as needed for headache.   Yes [provider]  aspirin-acetaminophen-caffeine (EXCEDRIN MIGRAINE) [phone] MG tablet Take 2 tablets by mouth every 6 (six) hours as needed for headache.   Yes [provider]  Multiple Vitamins-Minerals (ONE-A-DAY WOMENS PO) Take 1 tablet by mouth daily.   Yes [provider]  omeprazole (PRILOSEC) 20 MG capsule Take 40 mg by mouth daily as needed.   Yes [provider]  Phenylephrine-APAP-guaiFENesin (VICKS SINEX SEVERE PO) Take 2 capsules by mouth daily as needed (headache).   Yes [provider]  ondansetron (ZOFRAN) 4 MG tablet Take 1 tablet (4 mg total) by mouth every 8 (eight) hours as needed for nausea or vomiting. Patient not taking: Reported on 11/08/2021 09/22/21   Scheeler, Kermit Balo, PA-C  ondansetron (ZOFRAN-ODT) 4 MG disintegrating tablet Take 1 tablet (4 mg total) by mouth every 8 (eight) hours as needed for nausea or vomiting. Patient not taking: Reported on 11/08/2021 07/18/21   Rachel Moulds, MD    Allergies   Allergies  Allergen Reactions   Carboplatin Hives and Itching    Carboplatin reaction after dose #5 requiring Benadryl,  Solumedrol, epinephrine, Claritin.   Other Hives    Baking Soda   Wound Dressing Adhesive Hives    Rips skin, prefers paper tape    Review of Systems  ROS  Neurologic Exam  Awake, alert, oriented Memory and concentration grossly intact Speech fluent, appropriate CN grossly intact Motor exam: Upper Extremities Deltoid Bicep Tricep Grip  Right 5/5 5/5 5/5 5/5  Left 5/5 5/5 5/5 5/5   Lower Extremities IP Quad PF DF EHL  Right 5/5 5/5 5/5 5/5 5/5  Left 5/5 5/5 5/5 5/5 5/5   (+) Right pronator drift Sensation grossly intact to LT  Imaging    MRI of the brain dated 11/07/2021 was personally reviewed.  This demonstrates avidly enhancing lesion in the parasagittal posterior left frontal lobe measuring approximately 2-1/2 cm in maximal dimension.  There is significant associated edema with partial effacement of the left lateral ventricle.  There is minimal midline shift.  There is no hydrocephalus.  Impression  - 46 y.o. female   With history of breast cancer presenting with transient episodes of right upper extremity weakness, clinically suspicious for focal motor seizures.  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  There is significant associated edema.  With the presentation with seizures and the associated edema, I do think surgical resection is reasonable with pre- or postop stereotactic radiosurgery.  Plan  -   We will plan on stereotactic left frontal craniotomy for resection of the tumor this Friday afternoon, 11/08/2021 -  Pt will need adjuvant stereotactic radiosurgery either pre- or postoperatively. Will discuss with radiation oncology. -  I will order stereotactic MRI of the brain with contrast for intraoperative use and radiation planning -  continue Decadron and Keppra   I have reviewed the imaging findings with the patient and her daughter at bedside.  We have discussed  general treatment options for intracranial metastases including  primary stereotactic radiosurgery versus surgical resection and adjuvant radiosurgery.  Given the size of the lesion as well as the associated edema and Clinical focal seizures I do think that surgical resection is in the preferable option.   I will speak with the radiation oncology service regarding the feasibility of preoperative versus postoperative stereotactic radiosurgery as adjuvant treatment.   [name], MD [facility]

## 2021-11-08 NOTE — TOC Initial Note (Signed)
Transition of Care (TOC) - Initial/Assessment Note  ? ? ?Patient Details  ?Name: Sheryl Mcmillan ?MRN: [id number] ?Date of Birth: December 29, 1975 ? ?Transition of Care (TOC) CM/SW Contact:    ?Pollie Friar, RN ?Phone Number: ?11/08/2021, 2:02 PM ? ?Clinical Narrative:                 ?Patient is from home. Recommendations currently are for outpatient therapy. Plan is for OR on Friday. Will see what recommendations are after surgery.  ?TOC following. ? ?  ?  ? ? ?Patient Goals and CMS Choice ?  ?  ?  ? ?Expected Discharge Plan and Services ?  ?  ?  ?  ?  ?                ?  ?  ?  ?  ?  ?  ?  ?  ?  ?  ? ?Prior Living Arrangements/Services ?  ?  ?  ?       ?  ?  ?  ?  ? ?Activities of Daily Living ?  ?  ? ?Permission Sought/Granted ?  ?  ?   ?   ?   ?   ? ?Emotional Assessment ?  ?  ?  ?  ?  ?  ? ?Admission diagnosis:  Brain mass [G93.89] ?Vasogenic edema ([place]) [G93.6] ?Patient Active Problem List  ? Diagnosis Date Noted  ? Brain mass 11/07/2021  ? Right sided weakness 11/07/2021  ? IDA (iron deficiency anemia) 07/18/2021  ? Cholelithiasis 03/14/2021  ? 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 Morbid obesity with BMI of 40.0-44.9, adult ([place]) 07/19/2020  ? BRCA1 gene mutation positive 07/04/2020  ? Genetic testing 06/28/2020  ? Family history of non-Hodgkin's lymphoma 06/01/2020  ? Malignant neoplasm of upper-outer quadrant of left breast in female, estrogen receptor positive ([place]) 05/26/2020  ? ?PCP:  Windell Hummingbird, PA-C ?Pharmacy:   ?[facility] ?[address] ?[place] [address] ?Phone: [phone] Fax: [phone] ? ? ? ? ?Social Determinants of Health (SDOH) Interventions ?  ? ?Readmission Risk Interventions ?   ? View : No data to display.  ?  ?  ?  ? ? ? ?

## 2021-11-08 NOTE — Assessment & Plan Note (Signed)
MRI brain shows edema, likely leading to focal seizures, treated with Decadron and Keppra. ?

## 2021-11-08 NOTE — Progress Notes (Signed)
I spoke with the patient's floor nurse, Kasandra Knudsen, about the plans for pre-operative radiation. I have requested his assistance with arranging Care Link to bring Ms. Malacara to our department Thursday 5/18, at 8:00am. Once I know more about her treatment time on Friday, I will call back to also arrange care link travel for that visit.  ? ?Mont Dutton R.T.(R)(T) ?Radiation Special Procedures Navigator  ?[phone] ?

## 2021-11-08 NOTE — Consult Note (Signed)
?Radiation Oncology         [phone] ?________________________________ ? ?Name: Sheryl Mcmillan        MRN: [id number]  ?Date of Service: 11/08/21 DOB: Jun 16, 1976 ? ?TG:GYIRSW, Estill Bamberg, PA-C   ? ?REFERRING PHYSICIAN: Dr. Kathyrn Sheriff ? ? ?DIAGNOSIS: The primary encounter diagnosis was Brain mass. Diagnoses of Vasogenic edema (HCC) and Malignant neoplasm of upper-outer quadrant of left breast in female, estrogen receptor positive ([place]) were also pertinent to this visit. ? ? ?HISTORY OF PRESENT ILLNESS: Sheryl Mcmillan is a 46 y.o. female seen at the request of Dr. 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  She received neoadjuvant chemotherapy and continued presents the lab until December 2022.  She underwent bilateral mastectomies without evidence of disease on the right but in the left residual T1cN1 disease was present, her tumor converted to triple negative.  She did not receive any other consolidative adjuvant therapy.  She underwent hysterectomy with BSO in September 2022.  She continues tamoxifen under the care of Dr. Chryl Heck.the patient presented to the ED yesterday complaining of right sided extremity weakness. She has not had any headaches or visual changes and she underwent a CT head showing  a 2 cm mass in the posteriomedial left frontal lobe with moderate surrounding vasogenic edema. There was mass effect with parital effacement of the left lateral ventricle and trace rightward midline shift. MRI with and without contrast showed a 2.2 cm partially solid left superior hemisphere mass with a large area of vasogenic edema.  She has been receiving Dexamethasone 4 mg q 6 hours which has helped her symptoms and she's contacted to discuss stereotactic  radiosurgery [facility]). Dr. Kathyrn Sheriff has offered surgical resection which is planned for Friday of this week.  ? ? ? ?PREVIOUS RADIATION THERAPY: No ? ? ?PAST MEDICAL HISTORY:  ?Past Medical History:  ?Diagnosis Date  ? Arthritis   ? Breast cancer ([place])   ? Family history of non-Hodgkin's lymphoma 06/01/2020  ? GERD (gastroesophageal reflux disease)   ? Hemorrhoids   ? Migraines   ? PCOS (polycystic ovarian syndrome)   ? PONV (postoperative nausea and vomiting)   ?   ? ? ?PAST SURGICAL HISTORY: ?Past Surgical History:  ?Procedure Laterality Date  ? AXILLARY SENTINEL NODE BIOPSY Left 11/07/2020  ? Procedure: LEFT AXILLARY SENTINEL NODE BIOPSY;  Surgeon: [name], MD;  Location: [place];  Service: General;  Laterality: Left;  ? BREAST CYST EXCISION Right 09/22/2021  ? Procedure: Excision of right axillary excess soft tissue.;  Surgeon: [name], MD;  Location: [place];  Service: Plastics;  Laterality: Right;  ? BREAST RECONSTRUCTION WITH PLACEMENT OF TISSUE EXPANDER AND FLEX HD (ACELLULAR HYDRATED DERMIS) Bilateral 11/07/2020  ? Procedure: BILATERAL BREAST RECONSTRUCTION WITH PLACEMENT OF TISSUE EXPANDER AND FLEX HD (ACELLULAR HYDRATED DERMIS);  Surgeon: [name], MD;  Location: [place];  Service: Plastics;  Laterality: Bilateral;  ? COSMETIC SURGERY    ? DILATION AND CURETTAGE OF UTERUS    ? HEMORRHOID SURGERY    ? IR IMAGING GUIDED PORT INSERTION  06/15/2020  ? LIPOSUCTION WITH LIPOFILLING Bilateral 09/22/2021  ? Procedure: Fat grafting bilateral breast;  Surgeon: [name], MD;  Location: [place];  Service: Clinical cytogeneticist;  Laterality: Bilateral;  ? MASS EXCISION Bilateral 04/25/2021  ? Procedure: excision of bilateral axillary breast tissue;  Surgeon: [name], MD;  Location: [place];  Service: Plastics;  Laterality: Bilateral;  ? PORT-A-CATH REMOVAL Right 11/07/2020  ? Procedure: REMOVAL PORT-A-CATH;  Surgeon: [name], MD;   Location: [place];  Service: General;  Laterality: Right;  ? REMOVAL OF BILATERAL TISSUE EXPANDERS WITH PLACEMENT OF BILATERAL BREAST IMPLANTS Bilateral 04/25/2021  ? Procedure: REMOVAL OF BILATERAL TISSUE EXPANDERS WITH PLACEMENT OF BILATERAL BREAST IMPLANTS;  Surgeon: [name], MD;  Location: [place];  Service: Plastics;  Laterality: Bilateral;  ? ROBOTIC ASSISTED TOTAL HYSTERECTOMY WITH BILATERAL SALPINGO OOPHERECTOMY Bilateral 03/14/2021  ? Procedure: XI ROBOTIC ASSISTED TOTAL HYSTERECTOMY WITH BILATERAL SALPINGO OOPHORECTOMY;  Surgeon: Everitt Amber, MD;  Location: [facility];  Service: Gynecology;  Laterality: Bilateral;  ? TOTAL MASTECTOMY Bilateral 11/07/2020  ? Procedure: BILATERAL MASTECTOMY;  Surgeon: [name], MD;  Location: [facility];  Service: General;  Laterality: Bilateral;  ? ? ? ?FAMILY HISTORY:  ?Family History  ?Problem Relation Age of Onset  ? Hypertension Mother   ? Diabetes Mother   ? Cancer Mother 38  ?     unknown primary  ? Other Mother   ?     brain tumor; dx 54s  ? Non-Hodgkin's lymphoma Brother 66  ? Other Maternal Uncle 44  ?     brain tumor  ? Breast cancer Other   ?     MGM's niece; dx late 69s  ? Pancreatic cancer Neg Hx   ? Colon cancer Neg Hx   ? Endometrial cancer Neg Hx   ? Prostate cancer Neg Hx   ? Ovarian cancer Neg Hx   ? ? ? ?SOCIAL HISTORY:  reports that she quit smoking about 14 months ago. Her smoking use included cigarettes. She has a 5.00 pack-year smoking history. She has never used smokeless tobacco. She reports that she does not currently use alcohol. She reports current drug use. Drug: Marijuana. ? ? ?ALLERGIES: Carboplatin, Other, and Wound dressing adhesive ? ? ?MEDICATIONS:  ?Current Facility-Administered Medications  ?Medication Dose Route Frequency Provider Last Rate Last Admin  ? acetaminophen (TYLENOL) tablet 650 mg  650 mg Oral Q6H PRN Lenore Cordia, MD   650 mg at 11/08/21 0370  ? Or  ? acetaminophen (TYLENOL) suppository 650 mg  650  mg Rectal Q6H PRN Lenore Cordia, MD      ? dexamethasone (DECADRON) tablet 4 mg  4 mg Oral Q6H Curatolo, Adam, DO   4 mg at 11/08/21 1222  ? levETIRAcetam (KEPPRA) IVPB 500 mg/100 mL premix  500 mg Intravenous Q12H Curatolo, Adam, DO 400 mL/hr at 11/08/21 1434 500 mg at 11/08/21 1434  ? LORazepam (ATIVAN) injection 1 mg  1 mg Intravenous PRN Hayden Pedro, PA-C   1 mg at 11/08/21 1517  ? ondansetron (ZOFRAN) tablet 4 mg  4 mg Oral Q6H PRN Lenore Cordia, MD      ? Or  ? ondansetron (ZOFRAN) injection 4 mg  4 mg Intravenous Q6H PRN Lenore Cordia, MD      ? oxyCODONE (Oxy IR/ROXICODONE) immediate release tablet 5 mg  5 mg Oral Q6H PRN Edwin Dada, MD   5 mg at 11/08/21 1222  ? senna-docusate (Senokot-S) tablet 1 tablet  1 tablet Oral QHS PRN Lenore Cordia, MD      ? ? ? ?REVIEW OF SYSTEMS: On  review of systems, the patient reports that she feels as though the steroids have helped her significantly, she is not having any extremity weakness at this time.  She denies headaches or fevers or chills.  She has not had nausea.  She is claustrophobic and reports that she is worried about her MRI scan this evening because of this.  She did do well however with the use of Ativan during her scan yesterday.  No other complaints are verbalized. ? ?PHYSICAL EXAM:  ?Wt Readings from Last 3 Encounters:  ?11/07/21 (!) 315 lb 3.2 oz (143 kg)  ?10/17/21 (!) 315 lb 3.2 oz (143 kg)  ?10/02/21 (!) 315 lb 8 oz (143.1 kg)  ? ?Temp Readings from Last 3 Encounters:  ?11/08/21 98.5 ?F (36.9 ?C) (Oral)  ?10/17/21 97.7 ?F (36.5 ?C) (Temporal)  ?09/22/21 98.8 ?F (37.1 ?C)  ? ?BP Readings from Last 3 Encounters:  ?11/08/21 (!) 134/96  ?10/17/21 (!) 125/101  ?09/22/21 130/79  ? ?Pulse Readings from Last 3 Encounters:  ?11/08/21 98  ?10/17/21 84  ?09/22/21 (!) 103  ? ?Pain Assessment ?Pain Score: 5 /10 ? ?Unable to assess given encounter type ? ? ?ECOG = 2 ? ?0 - Asymptomatic (Fully active, able to carry on all predisease  activities without restriction) ? ?1 - Symptomatic but completely ambulatory (Restricted in physically strenuous activity but ambulatory and able to carry out work of a light or sedentary nature. For example, l

## 2021-11-08 NOTE — Evaluation (Signed)
Physical Therapy Evaluation ?Patient Details ?Name: Sheryl Mcmillan ?MRN: [id number] ?DOB: February 13, 1976 ?Today's Date: 11/08/2021 ? ?History of Present Illness ? Pt is a 46 y.o. female who presented to the ED 5/16 with R side weakness.  MRI revealed partially solid contrast-enhancing mass in the superior left hemisphere with large area of vasogenic edema, most consistent with  metastatic disease.PMH: left breast cancer (ER negative, PR positive, BRCA1 positive) s/p neoadjuvant chemotherapy, bilateral mastectomy, hysterectomy and bilateral SOO ?  ?Clinical Impression ? Pt admitted with above diagnosis. PTA pt lived at home in one-level apartment with her children, 3 steps to enter. Pt independent mobility. She reports recurrent episodes of R side weakness with varying levels of severity, and jerking/ataxic movements RUE/LE during the episodes. She reports the length and frequency of these episodes have been increasing. Pt currently with functional limitations due to the deficits listed below (see PT Problem List). On eval, pt presents with proximal weakness RUE/LE. She demonstrates independence with bed mobility and transfers. Supervision provided for ambulation. During eval, pt not having a recurring episode of the R weakness that she has been experiencing.Pt will benefit from skilled PT to increase their independence and safety with mobility to allow discharge home. PT to further assess community ambulation and stairs. Recommend rollator for home. No follow up PT services indicated.   ?   ?   ? ?Recommendations for follow up therapy are one component of a multi-disciplinary discharge planning process, led by the attending physician.  Recommendations may be updated based on patient status, additional functional criteria and insurance authorization. ? ?Follow Up Recommendations No PT follow up ? ?  ?Assistance Recommended at Discharge PRN  ?Patient can return home with the following ?   ? ?  ?Equipment Recommendations  Rollator (4 wheels)  ?Recommendations for Other Services ?    ?  ?Functional Status Assessment Patient has had a recent decline in their functional status and demonstrates the ability to make significant improvements in function in a reasonable and predictable amount of time.  ? ?  ?Precautions / Restrictions Precautions ?Precautions: Fall ?Restrictions ?Weight Bearing Restrictions: No  ? ?  ? ?Mobility ? Bed Mobility ?Overal bed mobility: Independent ?  ?  ?  ?  ?  ?  ?  ?  ? ?Transfers ?Overall transfer level: Independent ?Equipment used: None ?  ?  ?  ?  ?  ?  ?  ?  ?  ? ?Ambulation/Gait ?Ambulation/Gait assistance: Supervision ?Gait Distance (Feet): 75 Feet ?Assistive device: None, Rolling walker (2 wheels) ?Gait Pattern/deviations: Step-through pattern, Decreased stride length ?Gait velocity: WFL ?Gait velocity interpretation: >2.62 ft/sec, indicative of community ambulatory ?  ?General Gait Details: Supervision in room amb without AD. Trial of gait with RW. ? ?Stairs ?  ?  ?  ?  ?  ? ?Wheelchair Mobility ?  ? ?Modified Rankin (Stroke Patients Only) ?  ? ?  ? ?Balance Overall balance assessment: No apparent balance deficits (not formally assessed) ?  ?  ?  ?  ?  ?  ?  ?  ?  ?  ?  ?  ?  ?  ?  ?  ?  ?  ?   ? ? ? ?Pertinent Vitals/Pain Pain Assessment ?Pain Assessment: No/denies pain  ? ? ?Home Living Family/patient expects to be discharged to:: Private residence ?Living Arrangements: Children ?Available Help at Discharge: Family;Available 24 hours/day ?Type of Home: Apartment ?Home Access: Stairs to enter ?  ?Entrance Stairs-Number of Steps: 3 ?  ?  Home Layout: One level ?Home Equipment: BSC/3in1;Hospital bed;Shower seat ?   ?  ?Prior Function Prior Level of Function : Independent/Modified Independent;Driving ?  ?  ?  ?  ?  ?  ?  ?ADLs Comments: uses shower seat ?  ? ? ?Hand Dominance  ?   ? ?  ?Extremity/Trunk Assessment  ? Upper Extremity Assessment ?Upper Extremity Assessment: Defer to OT evaluation ?  ? ?Lower  Extremity Assessment ?Lower Extremity Assessment: RLE deficits/detail ?RLE Deficits / Details: 4+/5 hip flexors, 5/5 knee and ankle ?RLE Sensation: WNL ?  ? ?Cervical / Trunk Assessment ?Cervical / Trunk Assessment: Normal  ?Communication  ? Communication: No difficulties  ?Cognition Arousal/Alertness: Awake/alert ?Behavior During Therapy: [facility] for tasks assessed/performed ?Overall Cognitive Status: Within Functional Limits for tasks assessed ?  ?  ?  ?  ?  ?  ?  ?  ?  ?  ?  ?  ?  ?  ?  ?  ?  ?  ?  ? ?  ?General Comments   ? ?  ?Exercises    ? ?Assessment/Plan  ?  ?PT Assessment Patient needs continued PT services  ?PT Problem List Decreased mobility;Decreased activity tolerance;Decreased knowledge of use of DME ? ?   ?  ?PT Treatment Interventions DME instruction;Therapeutic activities;Gait training;Patient/family education;Stair training;Balance training;Functional mobility training   ? ?PT Goals (Current goals can be found in the Care Plan section)  ?Acute Rehab PT Goals ?Patient Stated Goal: home ?PT Goal Formulation: With patient ?Time For Goal Achievement: 11/22/21 ?Potential to Achieve Goals: Good ? ?  ?Frequency Min 3X/week ?  ? ? ?Co-evaluation   ?  ?  ?  ?  ? ? ?  ?AM-PAC PT "6 Clicks" Mobility  ?Outcome Measure Help needed turning from your back to your side while in a flat bed without using bedrails?: None ?Help needed moving from lying on your back to sitting on the side of a flat bed without using bedrails?: None ?Help needed moving to and from a bed to a chair (including a wheelchair)?: None ?Help needed standing up from a chair using your arms (e.g., wheelchair or bedside chair)?: None ?Help needed to walk in hospital room?: A Little ?Help needed climbing 3-5 steps with a railing? : A Little ?6 Click Score: 22 ? ?  ?End of Session   ?Activity Tolerance: Patient tolerated treatment well ?Patient left: in bed;with call bell/phone within reach;with family/visitor present ?Nurse Communication: Mobility  status ?PT Visit Diagnosis: Difficulty in walking, not elsewhere classified (R26.2) ?  ? ?Time: 0626-9485 ?PT Time Calculation (min) (ACUTE ONLY): 15 min ? ? ?Charges:   PT Evaluation ?$PT Eval Low Complexity: 1 Low ?  ?  ?   ? ? ?Lorrin Goodell, PT  ?Office # [phone] ?Pager [phone] ? ? ?Lorriane Shire ?11/08/2021, 9:41 AM ? ?

## 2021-11-08 NOTE — Evaluation (Signed)
Occupational Therapy Evaluation ?Patient Details ?Name: Sheryl Mcmillan ?MRN: [id number] ?DOB: 24-Sep-1975 ?Today's Date: 11/08/2021 ? ? ?History of Present Illness Pt is a 46 y.o. female who presented to the ED 5/16 with R side weakness.  MRI revealed partially solid contrast-enhancing mass in the superior left hemisphere with large area of vasogenic edema, most consistent with  metastatic disease.PMH: left breast cancer (ER negative, PR positive, BRCA1 positive) s/p neoadjuvant chemotherapy, bilateral mastectomy, hysterectomy and bilateral SOO  ? ?Clinical Impression ?  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  Patient admitted for above and presents with problem list below, including R UE weakness and decreased coordination, impaired balance and decreased activity tolerance.  Cognitively, patient reports still having "chemo brain" but is able to engage, follow commands and problem solve during session- may benefit from further cognitive assessment.  Patient able to complete ADLs with up to min guard assist, noted R lateral lean at EOB and able to self correct once cued, min guard for mobility and transfers in room for safety.  Per patient, at times R UE is weaker/less coordination (she has dropped her phone before) and vision is blurry.  Issued squeeze ball and level 2 theraband and reviewed exercises for R UE. Based on performance today, believe she will benefit from continued OT services while admitted and after dc at OP OT level to optimize independence, safety and return to PLOF.   ?   ? ?Recommendations for follow up therapy are one component of a multi-disciplinary discharge planning process, led by the attending physician.  Recommendations may be updated based on patient status, additional functional criteria and insurance authorization.  ? ?Follow Up Recommendations ? Outpatient OT  ?  ?Assistance Recommended at Discharge  Intermittent Supervision/Assistance  ?Patient can return home with the following A little help with walking and/or transfers;A little help with bathing/dressing/bathroom;Assistance with cooking/housework;Assist for transportation ? ?  ?Functional Status Assessment ? Patient has had a recent decline in their functional status and demonstrates the ability to make significant improvements in function in a reasonable and predictable amount of time.  ?Equipment Recommendations ? None recommended by OT  ?  ?Recommendations for Other Services   ? ? ?  ?Precautions / Restrictions Precautions ?Precautions: Fall ?Restrictions ?Weight Bearing Restrictions: No  ? ?  ? ?Mobility Bed Mobility ?Overal bed mobility: Independent ?  ?  ?  ?  ?  ?  ?  ?  ? ?Transfers ?  ?  ?  ?  ?  ?  ?  ?  ?  ?  ?  ? ?  ?Balance Overall balance assessment: Needs assistance ?Sitting-balance support: No upper extremity supported, Feet supported ?Sitting balance-Leahy Scale: Fair ?Sitting balance - Comments: R lateral lean at EOB intermittently, pt able to self correct when cued ?Postural control: Right lateral lean ?Standing balance support: No upper extremity supported, During functional activity ?Standing balance-Leahy Scale: Good ?  ?  ?  ?  ?  ?  ?  ?  ?  ?  ?  ?  ?   ? ?ADL either performed or assessed with clinical judgement  ? ?ADL Overall ADL's : Needs assistance/impaired ?  ?  ?Grooming: Min guard;Standing ?  ?  ?  ?  ?  ?Upper Body Dressing : Sitting;Min guard ?  ?Lower Body Dressing: Min guard;Sit to/from stand ?  ?Toilet Transfer: Min guard;Ambulation ?  ?  ?  ?  ?  ?  Functional mobility during ADLs: Min guard ?   ? ? ? ?Vision Baseline Vision/History: 1 Wears glasses ?Ability to See in Adequate Light: 0 Adequate ?Patient Visual Report: Blurring of vision (intermittently) ?Vision Assessment?: No apparent visual deficits  ?   ?Perception   ?  ?Praxis   ?  ? ?Pertinent Vitals/Pain Pain Assessment ?Pain Assessment: No/denies pain  ? ? ? ?Hand  Dominance Right ?  ?Extremity/Trunk Assessment Upper Extremity Assessment ?Upper Extremity Assessment: RUE deficits/detail ?RUE Deficits / Details: grossly 3/5 MMT, decreased coordination ?RUE Sensation: WNL ?RUE Coordination: decreased fine motor;decreased gross motor ?  ?Lower Extremity Assessment ?Lower Extremity Assessment: Defer to PT evaluation ?RLE Deficits / Details: 4+/5 hip flexors, 5/5 knee and ankle ?RLE Sensation: WNL ?  ?Cervical / Trunk Assessment ?Cervical / Trunk Assessment: Normal ?  ?Communication Communication ?Communication: No difficulties ?  ?Cognition Arousal/Alertness: Awake/alert ?Behavior During Therapy: [facility] for tasks assessed/performed ?Overall Cognitive Status: Within Functional Limits for tasks assessed ?  ?  ?  ?  ?  ?  ?  ?  ?  ?  ?  ?  ?  ?  ?  ?  ?General Comments: reports still has "chemo brain"-- appears [facility] but would benefit from further assessment ?  ?  ?General Comments  daughter present and supportive ? ?  ?Exercises   ?  ?Shoulder Instructions    ? ? ?Home Living Family/patient expects to be discharged to:: Private residence ?Living Arrangements: Children ?Available Help at Discharge: Family;Available 24 hours/day ?Type of Home: Apartment ?Home Access: Stairs to enter ?Entrance Stairs-Number of Steps: 3 ?  ?Home Layout: One level ?  ?  ?Bathroom Shower/Tub: Tub/shower unit ?  ?Bathroom Toilet: Standard ?  ?  ?Home Equipment: BSC/3in1;Hospital bed;Shower seat ?  ?  ?  ? ?  ?Prior Functioning/Environment Prior Level of Function : Independent/Modified Independent;Driving ?  ?  ?  ?  ?  ?  ?  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 Reports has been using BSC in room at night. Reports difficulty wiping after toileting since breast sx. ?  ? ?  ?  ?OT Problem List: Decreased strength;Decreased activity tolerance;Impaired balance (sitting and/or standing);Decreased coordination;Decreased knowledge of use of DME or AE;Decreased  knowledge of precautions;Obesity;Impaired UE functional use ?  ?   ?OT Treatment/Interventions: Self-care/ADL training;Therapeutic exercise;DME and/or AE instruction;Therapeutic activities;Balance training;Patient/family education;Energy conservation;Neuromuscular education  ?  ?OT Goals(Current goals can be found in the care plan section) Acute Rehab OT Goals ?Patient Stated Goal: get better ?OT Goal Formulation: With patient ?Time For Goal Achievement: 11/22/21 ?Potential to Achieve Goals: Good  ?OT Frequency: Min 2X/week ?  ? ?Co-evaluation   ?  ?  ?  ?  ? ?  ?AM-PAC OT "6 Clicks" Daily Activity     ?Outcome Measure Help from another person eating meals?: A Little ?Help from another person taking care of personal grooming?: A Little ?Help from another person toileting, which includes using toliet, bedpan, or urinal?: A Little ?Help from another person bathing (including washing, rinsing, drying)?: A Little ?Help from another person to put on and taking off regular upper body clothing?: A Little ?Help from another person to put on and taking off regular lower body clothing?: A Little ?6 Click Score: 18 ?  ?End of Session Equipment Utilized During Treatment: Gait belt ?Nurse Communication: Mobility status ? ?Activity Tolerance: Patient tolerated treatment well ?Patient left: in bed;with call bell/phone within reach;with  family/visitor present ? ?OT Visit Diagnosis: Other abnormalities of gait and mobility (R26.89);Muscle weakness (generalized) (M62.81)  ?              ?Time: 0447-1580 ?OT Time Calculation (min): 26 min ?Charges:  OT General Charges ?$OT Visit: 1 Visit ?OT Evaluation ?$OT Eval Moderate Complexity: 1 Mod ?OT Treatments ?$Self Care/Home Management : 8-22 mins ? ?Jolaine Artist, OT ?Acute Rehabilitation Services ?Pager [phone] ?Office [phone] ? ? ?Delight Stare ?11/08/2021, 11:19 AM ?

## 2021-11-08 NOTE — Assessment & Plan Note (Signed)
BMI 43 

## 2021-11-08 NOTE — Hospital Course (Addendum)
Sheryl Mcmillan is a 46 y.o. F with hx BRCA1 mutation and L Br CA s/p neoadj chemo, mastectomy and hysterectomy who presented with intermittent right sided weakness.    In the ER, MRI brain showed a left parasagittal brain mass.  Neurosurgery were consulted.  They arranged for expedited pre-surgical SRS and plan for resection of the tumor on 5/19.

## 2021-11-09 ENCOUNTER — Ambulatory Visit
Admit: 2021-11-09 | Discharge: 2021-11-09 | Disposition: A | Discharge status: Home / Self Care | Payer: Medicaid Other | Attending: Radiation Oncology | Admitting: Radiation Oncology

## 2021-11-09 ENCOUNTER — Encounter: Payer: Self-pay | Admitting: Licensed Clinical Social Worker

## 2021-11-09 ENCOUNTER — Telehealth: Payer: Self-pay | Admitting: Radiation Therapy

## 2021-11-09 ENCOUNTER — Other Ambulatory Visit: Payer: Self-pay | Admitting: Radiation Oncology

## 2021-11-09 VITALS — BP 124/83 | HR 83 | Temp 98.1°F | Resp 20

## 2021-11-09 DIAGNOSIS — D496 Neoplasm of unspecified behavior of brain: Secondary | ICD-10-CM

## 2021-11-09 DIAGNOSIS — G936 Cerebral edema: Secondary | ICD-10-CM | POA: Diagnosis not present

## 2021-11-09 DIAGNOSIS — F40240 Claustrophobia: Secondary | ICD-10-CM

## 2021-11-09 DIAGNOSIS — G40209 Localization-related (focal) (partial) symptomatic epilepsy and epileptic syndromes with complex partial seizures, not intractable, without status epilepticus: Secondary | ICD-10-CM | POA: Diagnosis not present

## 2021-11-09 DIAGNOSIS — G9389 Other specified disorders of brain: Secondary | ICD-10-CM | POA: Diagnosis not present

## 2021-11-09 DIAGNOSIS — C7931 Secondary malignant neoplasm of brain: Secondary | ICD-10-CM

## 2021-11-09 DIAGNOSIS — Z170 Estrogen receptor positive status [ER+]: Secondary | ICD-10-CM

## 2021-11-09 MED ORDER — LEVETIRACETAM 500 MG PO TABS
500.0000 mg | ORAL_TABLET | Freq: Two times a day (BID) | ORAL | Status: DC
  Administered 2021-11-09 – 2021-11-17 (×18): 500 mg via ORAL
  Filled 2021-11-09 (×18): qty 1

## 2021-11-09 MED ORDER — LORAZEPAM 1 MG PO TABS
1.0000 mg | ORAL_TABLET | Freq: Once | ORAL | Status: DC
  Filled 2021-11-09: qty 1

## 2021-11-09 MED ORDER — PROCHLORPERAZINE EDISYLATE 10 MG/2ML IJ SOLN
10.0000 mg | Freq: Four times a day (QID) | INTRAMUSCULAR | Status: DC | PRN
Start: 2021-11-09 — End: 2021-11-17

## 2021-11-09 MED ORDER — LORAZEPAM 2 MG/ML IJ SOLN: 1.0000 mg | Freq: Three times a day (TID) | INTRAMUSCULAR | Status: DC | PRN

## 2021-11-09 MED ORDER — LORAZEPAM 1 MG PO TABS
1.0000 mg | ORAL_TABLET | Freq: Once | ORAL | Status: AC
  Administered 2021-11-09: 1 mg via ORAL
  Filled 2021-11-09: qty 1

## 2021-11-09 NOTE — Progress Notes (Signed)
  Progress Note   Patient: Sheryl Mcmillan WEX:[id number] DOB: 03-22-1976 DOA: 11/07/2021     1 DOS: the patient was seen and examined on 11/09/2021 at       Brief hospital course: Mrs. Hineman is a 46 y.o. F with hx BRCA1 mutation and L Br CA s/p neoadj chemo, mastectomy and hysterectomy who presented with intermittent right sided weakness.    In the ER, MRI brain showed a left parasagittal brain mass.  Neurosurgery were consulted.  They arranged for expedited pre-surgical SRS and plan for resection of the tumor on 5/19.     Assessment and Plan: * Brain mass Solid mass in the superior left hemisphere, vasogenic edema intermittent right-sided weakness, suspect focal seizures.  No seizures overnight. - Continue Decadron - Continue Keppra  - Consult neurosurgery and Radiation Oncology     Right sided weakness We suspect her intermittent weakness were focal seizures.   Complex partial seizure ([place]) Due to edema/mass.  - Continue Keppra  Cerebral edema ([place]) MRI brain shows edema, likely leading to focal seizures, treated with Decadron and Keppra.  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  Mild headache.     Physical Exam: Vitals:   11/09/21 0000 11/09/21 0513 11/09/21 0728 11/09/21 1224  BP: 118/82 (!) 134/92 (!) 144/97 135/77  Pulse: 97 92 80 90  Resp:   20 16  Temp: 98.2 F (36.8 C) 97.7 F (36.5 C) 98.5 F (36.9 C) 98 F (36.7 C)  TempSrc: Oral Oral Oral Oral  SpO2: 98% 98% 100% 99%  Weight:      Height:       Adult female, lying in bed, interactive, no acute distress RRR, no murmurs, no peripheral edema Respiratory rate normal, lungs clear without rales or  wheezes Tension normal, affect normal, judgment insight appear normal, strength equal and symmetric in upper and lower extremities bilateral, speech fluent, face symmetric.     Data Reviewed: Neurosurgery notes reviewed, nursing notes reviewed, vital signs reviewed MRI report reviewed    Family Communication: Daughter at the bedside    Disposition: Status is: Inpatient Remains inpatient appropriate because: She has a brain tumor and partial seizures, she will continue stereotactic radiation therapy tomorrow and have surgery remove the tumor tomorrow afternoon        Author: Edwin Dada, MD 11/09/2021 3:20 PM  For on call review [email or web address].

## 2021-11-09 NOTE — Telephone Encounter (Signed)
I called [facility] dispatch to set up travel from [facility] to [place] on 5/19, arrival time of 10:30am to our department.   Mont Dutton R.T.(R)(T) Radiation Special Procedures Navigator

## 2021-11-09 NOTE — Progress Notes (Signed)
Attempted to see patient on 3W, currently at [facility] for [facility] simulation. Will see again tomorrow am. Cont to plan on preop SRS for left frontal met tomorrow am followed by surgical resection tomorrow afternoon. NPO p MN.  Consuella Lose, MD [facility]

## 2021-11-09 NOTE — Progress Notes (Signed)
Physical Therapy Treatment Patient Details Name: Sheryl Mcmillan MRN: [id number] DOB: 1975-09-12 Today's Date: 11/09/2021   History of Present Illness Pt is a 46 y.o. female who presented to the ED 5/16 with R side weakness.  MRI revealed partially solid contrast-enhancing mass in the superior left hemisphere with large area of vasogenic edema, most consistent with  metastatic disease.PMH: left breast cancer (ER negative, PR positive, BRCA1 positive) s/p neoadjuvant chemotherapy, bilateral mastectomy, hysterectomy and bilateral SOO    PT Comments    Pt reports just returning from [facility] Admits to being nervous about radiation and surgery tomorrow. Agreeable to walking in hallway with therapy. Focus on higher level balance and improved posture with gait to improve stability. D/c plans remain appropriate.     Recommendations for follow up therapy are one component of a multi-disciplinary discharge planning process, led by the attending physician.  Recommendations may be updated based on patient status, additional functional criteria and insurance authorization.  Follow Up Recommendations  No PT follow up     Assistance Recommended at Discharge PRN     Equipment Recommendations  Rollator (4 wheels)       Precautions / Restrictions Precautions Precautions: Fall Restrictions Weight Bearing Restrictions: No     Mobility  Bed Mobility Overal bed mobility: Independent                  Transfers Overall transfer level: Independent Equipment used: None                    Ambulation/Gait Ambulation/Gait assistance: Supervision Gait Distance (Feet): 300 Feet Assistive device: None Gait Pattern/deviations: Step-through pattern, Decreased stride length Gait velocity: WFL Gait velocity interpretation: >2.62 ft/sec, indicative of community ambulatory   General Gait Details: supervision with ambulation in hallway, min HHA for ambulation with head turns and looking  up and down. vc for upright posture and looking up and out       Balance Overall balance assessment: Needs assistance   Sitting balance-Leahy Scale: Normal                       High level balance activites: Head turns (looking up and down) High Level Balance Comments: pt with min HHA for performing higher level balance challenges with gait            Cognition Arousal/Alertness: Awake/alert Behavior During Therapy: WFL for tasks assessed/performed Overall Cognitive Status: Within Functional Limits for tasks assessed                                             General Comments General comments (skin integrity, edema, etc.): VSS      Pertinent Vitals/Pain Pain Assessment Pain Assessment: No/denies pain     PT Goals (current goals can now be found in the care plan section) Acute Rehab PT Goals Patient Stated Goal: home PT Goal Formulation: With patient Time For Goal Achievement: 11/22/21 Potential to Achieve Goals: Good Progress towards PT goals: Progressing toward goals    Frequency    Min 3X/week      PT Plan Current plan remains appropriate       AM-PAC PT "6 Clicks" Mobility   Outcome Measure  Help needed turning from your back to your side while in a flat bed without using bedrails?: None Help needed moving from lying on your  back to sitting on the side of a flat bed without using bedrails?: None Help needed moving to and from a bed to a chair (including a wheelchair)?: None Help needed standing up from a chair using your arms (e.g., wheelchair or bedside chair)?: None Help needed to walk in hospital room?: A Little Help needed climbing 3-5 steps with a railing? : A Little 6 Click Score: 22    End of Session   Activity Tolerance: Patient tolerated treatment well Patient left: in bed;with call bell/phone within reach Nurse Communication: Mobility status PT Visit Diagnosis: Difficulty in walking, not elsewhere  classified (R26.2)     Time: 6725-5001 PT Time Calculation (min) (ACUTE ONLY): 18 min  Charges:  $Gait Training: 8-22 mins                     Xoe Hoe B. Migdalia Dk PT, DPT Acute Rehabilitation Services Please use secure chat or  Call Office [phone]    [place] 11/09/2021, 3:36 PM

## 2021-11-09 NOTE — Progress Notes (Signed)
[place] CSW Progress Note  Clinical Social Worker saw patient while she was in radiation oncology today. Pt has had progression and now has a brain tumor with surgery scheduled tomorrow. Pt inquiring about any assistance as she had just gone back to work but will no longer be able to. CSW completed and sent in referral to [facility] for disability. Recommended asking for referral for personal care services before being discharged home. CSW also shared information on [employer] and will look into potential assistance from [place].  CSW will continue to follow patient for resource and emotional support.    Ramond Darnell E Ewa Hipp, LCSW    Patient is participating in a Managed Medicaid Plan:  Yes

## 2021-11-10 ENCOUNTER — Ambulatory Visit
Admit: 2021-11-10 | Discharge: 2021-11-10 | Disposition: A | Discharge status: Home / Self Care | Payer: Medicaid Other | Attending: Radiation Oncology | Admitting: Radiation Oncology

## 2021-11-10 ENCOUNTER — Other Ambulatory Visit: Payer: Self-pay

## 2021-11-10 ENCOUNTER — Inpatient Hospital Stay (HOSPITAL_COMMUNITY)
Admission: EM | Disposition: A | Discharge status: Rehabilitation Facility | Payer: Self-pay | Source: Home / Self Care | Attending: Neurosurgery

## 2021-11-10 ENCOUNTER — Inpatient Hospital Stay (HOSPITAL_COMMUNITY): Payer: Medicaid Other | Admitting: Anesthesiology

## 2021-11-10 ENCOUNTER — Encounter (HOSPITAL_COMMUNITY): Payer: Self-pay | Admitting: Family Medicine

## 2021-11-10 ENCOUNTER — Other Ambulatory Visit: Payer: Self-pay | Admitting: Radiation Therapy

## 2021-11-10 ENCOUNTER — Encounter: Payer: Self-pay | Admitting: Radiation Oncology

## 2021-11-10 DIAGNOSIS — D496 Neoplasm of unspecified behavior of brain: Secondary | ICD-10-CM

## 2021-11-10 DIAGNOSIS — C50919 Malignant neoplasm of unspecified site of unspecified female breast: Secondary | ICD-10-CM

## 2021-11-10 DIAGNOSIS — M1990 Unspecified osteoarthritis, unspecified site: Secondary | ICD-10-CM

## 2021-11-10 DIAGNOSIS — G709 Myoneural disorder, unspecified: Secondary | ICD-10-CM

## 2021-11-10 HISTORY — PX: APPLICATION OF CRANIAL NAVIGATION: SHX6578

## 2021-11-10 HISTORY — PX: CRANIOTOMY: SHX93

## 2021-11-10 LAB — SURGICAL PCR SCREEN
MRSA, PCR: NEGATIVE
Staphylococcus aureus: NEGATIVE

## 2021-11-10 LAB — RAD ONC ARIA SESSION SUMMARY
Course Elapsed Days: 0
Plan Fractions Treated to Date: 1
Plan Prescribed Dose Per Fraction: 18 Gy
Plan Total Fractions Prescribed: 1
Plan Total Prescribed Dose: 18 Gy
Reference Point Dosage Given to Date: 18 Gy
Reference Point Session Dosage Given: 14.386 Gy
Session Number: 1

## 2021-11-10 LAB — TYPE AND SCREEN
ABO/RH(D): A POS
Antibody Screen: NEGATIVE

## 2021-11-10 SURGERY — CRANIOTOMY TUMOR EXCISION
Anesthesia: General | Site: Head | Laterality: Left

## 2021-11-10 MED ORDER — DEXMEDETOMIDINE (PRECEDEX) IN NS 20 MCG/5ML (4 MCG/ML) IV SYRINGE
PREFILLED_SYRINGE | INTRAVENOUS | Status: AC
  Filled 2021-11-10: qty 5

## 2021-11-10 MED ORDER — PHENYLEPHRINE 80 MCG/ML (10ML) SYRINGE FOR IV PUSH (FOR BLOOD PRESSURE SUPPORT)
PREFILLED_SYRINGE | INTRAVENOUS | Status: DC | PRN
  Administered 2021-11-10: 160 ug via INTRAVENOUS

## 2021-11-10 MED ORDER — AMISULPRIDE (ANTIEMETIC) 5 MG/2ML IV SOLN
10.0000 mg | Freq: Once | INTRAVENOUS | Status: AC | PRN
  Administered 2021-11-10: 10 mg via INTRAVENOUS

## 2021-11-10 MED ORDER — THROMBIN 20000 UNITS EX SOLR
CUTANEOUS | Status: DC | PRN
  Administered 2021-11-10 (×2): 20 mL

## 2021-11-10 MED ORDER — CHLORHEXIDINE GLUCONATE CLOTH 2 % EX PADS
6.0000 | MEDICATED_PAD | Freq: Every morning | CUTANEOUS | Status: DC
  Administered 2021-11-12 – 2021-11-17 (×6): 6 via TOPICAL

## 2021-11-10 MED ORDER — SODIUM CHLORIDE 0.9 % IV SOLN: INTRAVENOUS | Status: DC

## 2021-11-10 MED ORDER — MANNITOL 20 % IV SOLN
INTRAVENOUS | Status: AC
  Filled 2021-11-10: qty 500

## 2021-11-10 MED ORDER — CLEVIDIPINE BUTYRATE 0.5 MG/ML IV EMUL
INTRAVENOUS | Status: DC | PRN
  Administered 2021-11-10: 2 mg/h via INTRAVENOUS

## 2021-11-10 MED ORDER — MANNITOL 20 % IV SOLN
50.0000 g | Status: DC
  Filled 2021-11-10 (×2): qty 250

## 2021-11-10 MED ORDER — SUGAMMADEX SODIUM 200 MG/2ML IV SOLN
INTRAVENOUS | Status: DC | PRN
  Administered 2021-11-10: 200 mg via INTRAVENOUS

## 2021-11-10 MED ORDER — LABETALOL HCL 5 MG/ML IV SOLN
10.0000 mg | INTRAVENOUS | Status: DC | PRN
  Administered 2021-11-10: 10 mg via INTRAVENOUS
  Administered 2021-11-10 – 2021-11-11 (×2): 40 mg via INTRAVENOUS
  Administered 2021-11-11: 10 mg via INTRAVENOUS
  Administered 2021-11-11 – 2021-11-13 (×5): 40 mg via INTRAVENOUS
  Administered 2021-11-13: 20 mg via INTRAVENOUS
  Administered 2021-11-13 (×2): 40 mg via INTRAVENOUS
  Filled 2021-11-10 (×3): qty 8
  Filled 2021-11-10: qty 4
  Filled 2021-11-10 (×2): qty 8
  Filled 2021-11-10: qty 4
  Filled 2021-11-10 (×3): qty 8
  Filled 2021-11-10 (×2): qty 4
  Filled 2021-11-10 (×2): qty 8

## 2021-11-10 MED ORDER — BACITRACIN ZINC 500 UNIT/GM EX OINT
TOPICAL_OINTMENT | CUTANEOUS | Status: DC | PRN
  Administered 2021-11-10 (×2): 1 via TOPICAL

## 2021-11-10 MED ORDER — THROMBIN 5000 UNITS EX SOLR
CUTANEOUS | Status: AC
  Filled 2021-11-10: qty 5000

## 2021-11-10 MED ORDER — MANNITOL 25 % IV SOLN
INTRAVENOUS | Status: DC | PRN
  Administered 2021-11-10: 50 g via INTRAVENOUS

## 2021-11-10 MED ORDER — LIDOCAINE 2% (20 MG/ML) 5 ML SYRINGE
INTRAMUSCULAR | Status: DC | PRN
Start: 2021-11-10 — End: 2021-11-10
  Administered 2021-11-10: 60 mg via INTRAVENOUS

## 2021-11-10 MED ORDER — PROPOFOL 10 MG/ML IV BOLUS
INTRAVENOUS | Status: DC | PRN
  Administered 2021-11-10: 100 mg via INTRAVENOUS
  Administered 2021-11-10: 200 mg via INTRAVENOUS
  Administered 2021-11-10: 50 mg via INTRAVENOUS

## 2021-11-10 MED ORDER — HYDROMORPHONE HCL 1 MG/ML IJ SOLN
INTRAMUSCULAR | Status: DC | PRN
Start: 2021-11-10 — End: 2021-11-10

## 2021-11-10 MED ORDER — FENTANYL CITRATE (PF) 250 MCG/5ML IJ SOLN
INTRAMUSCULAR | Status: AC
  Filled 2021-11-10: qty 5

## 2021-11-10 MED ORDER — HEMOSTATIC AGENTS (NO CHARGE) OPTIME
TOPICAL | Status: DC | PRN
  Administered 2021-11-10 (×2): 1

## 2021-11-10 MED ORDER — FENTANYL CITRATE (PF) 100 MCG/2ML IJ SOLN
25.0000 ug | INTRAMUSCULAR | Status: DC | PRN
  Administered 2021-11-10: 25 ug via INTRAVENOUS
  Administered 2021-11-10: 50 ug via INTRAVENOUS

## 2021-11-10 MED ORDER — THROMBIN 20000 UNITS EX SOLR
CUTANEOUS | Status: AC
  Filled 2021-11-10: qty 20000

## 2021-11-10 MED ORDER — ACETAMINOPHEN 10 MG/ML IV SOLN
INTRAVENOUS | Status: AC
  Filled 2021-11-10: qty 100

## 2021-11-10 MED ORDER — MORPHINE SULFATE (PF) 2 MG/ML IV SOLN
1.0000 mg | INTRAVENOUS | Status: DC | PRN
  Administered 2021-11-10 – 2021-11-11 (×2): 2 mg via INTRAVENOUS
  Administered 2021-11-12: 1 mg via INTRAVENOUS
  Filled 2021-11-10 (×3): qty 1

## 2021-11-10 MED ORDER — ORAL CARE MOUTH RINSE: 15.0000 mL | Freq: Once | OROMUCOSAL | Status: AC

## 2021-11-10 MED ORDER — HYDROMORPHONE HCL 1 MG/ML IJ SOLN
INTRAMUSCULAR | Status: AC
  Filled 2021-11-10: qty 0.5

## 2021-11-10 MED ORDER — ONDANSETRON HCL 4 MG/2ML IJ SOLN
INTRAMUSCULAR | Status: DC | PRN
  Administered 2021-11-10: 4 mg via INTRAVENOUS

## 2021-11-10 MED ORDER — DEXMEDETOMIDINE (PRECEDEX) IN NS 20 MCG/5ML (4 MCG/ML) IV SYRINGE
PREFILLED_SYRINGE | INTRAVENOUS | Status: DC | PRN
Start: 2021-11-10 — End: 2021-11-10
  Administered 2021-11-10: 12 ug via INTRAVENOUS

## 2021-11-10 MED ORDER — ROCURONIUM BROMIDE 10 MG/ML (PF) SYRINGE
PREFILLED_SYRINGE | INTRAVENOUS | Status: DC | PRN
  Administered 2021-11-10: 70 mg via INTRAVENOUS
  Administered 2021-11-10: 30 mg via INTRAVENOUS
  Administered 2021-11-10: 50 mg via INTRAVENOUS

## 2021-11-10 MED ORDER — ROCURONIUM BROMIDE 10 MG/ML (PF) SYRINGE
PREFILLED_SYRINGE | INTRAVENOUS | Status: AC
  Filled 2021-11-10: qty 10

## 2021-11-10 MED ORDER — DEXAMETHASONE SODIUM PHOSPHATE 10 MG/ML IJ SOLN
INTRAMUSCULAR | Status: DC | PRN
Start: 2021-11-10 — End: 2021-11-10
  Administered 2021-11-10: 10 mg via INTRAVENOUS

## 2021-11-10 MED ORDER — ONDANSETRON HCL 4 MG/2ML IJ SOLN
INTRAMUSCULAR | Status: AC
  Filled 2021-11-10: qty 2

## 2021-11-10 MED ORDER — LABETALOL HCL 5 MG/ML IV SOLN
INTRAVENOUS | Status: AC
  Filled 2021-11-10: qty 4

## 2021-11-10 MED ORDER — PANTOPRAZOLE SODIUM 40 MG IV SOLR
40.0000 mg | Freq: Every day | INTRAVENOUS | Status: DC
  Administered 2021-11-10: 40 mg via INTRAVENOUS
  Filled 2021-11-10: qty 10

## 2021-11-10 MED ORDER — LIDOCAINE 2% (20 MG/ML) 5 ML SYRINGE
INTRAMUSCULAR | Status: AC
  Filled 2021-11-10: qty 5

## 2021-11-10 MED ORDER — AMISULPRIDE (ANTIEMETIC) 5 MG/2ML IV SOLN
INTRAVENOUS | Status: AC
  Filled 2021-11-10: qty 4

## 2021-11-10 MED ORDER — FENTANYL CITRATE (PF) 250 MCG/5ML IJ SOLN
INTRAMUSCULAR | Status: DC | PRN
  Administered 2021-11-10: 50 ug via INTRAVENOUS
  Administered 2021-11-10 (×2): 100 ug via INTRAVENOUS

## 2021-11-10 MED ORDER — BUPIVACAINE HCL (PF) 0.5 % IJ SOLN
INTRAMUSCULAR | Status: DC | PRN
  Administered 2021-11-10: 7 mL

## 2021-11-10 MED ORDER — SODIUM CHLORIDE 0.9 % IV SOLN
INTRAVENOUS | Status: DC | PRN
Start: 2021-11-10 — End: 2021-11-10

## 2021-11-10 MED ORDER — CHLORHEXIDINE GLUCONATE 0.12 % MT SOLN
OROMUCOSAL | Status: AC
  Administered 2021-11-10: 15 mL via OROMUCOSAL
  Filled 2021-11-10: qty 15

## 2021-11-10 MED ORDER — DEXAMETHASONE SODIUM PHOSPHATE 10 MG/ML IJ SOLN
INTRAMUSCULAR | Status: AC
  Filled 2021-11-10: qty 1

## 2021-11-10 MED ORDER — LIDOCAINE-EPINEPHRINE 1 %-1:100000 IJ SOLN
INTRAMUSCULAR | Status: AC
  Filled 2021-11-10: qty 1

## 2021-11-10 MED ORDER — HYDROCODONE-ACETAMINOPHEN 5-325 MG PO TABS
1.0000 | ORAL_TABLET | ORAL | Status: DC | PRN
  Administered 2021-11-11 – 2021-11-16 (×13): 1 via ORAL
  Filled 2021-11-10 (×15): qty 1

## 2021-11-10 MED ORDER — PROPOFOL 10 MG/ML IV BOLUS
INTRAVENOUS | Status: AC
  Filled 2021-11-10: qty 20

## 2021-11-10 MED ORDER — PHENYLEPHRINE HCL-NACL 20-0.9 MG/250ML-% IV SOLN
INTRAVENOUS | Status: DC | PRN
  Administered 2021-11-10: 25 ug/min via INTRAVENOUS

## 2021-11-10 MED ORDER — ESMOLOL HCL 100 MG/10ML IV SOLN
INTRAVENOUS | Status: AC
  Filled 2021-11-10: qty 10

## 2021-11-10 MED ORDER — ESMOLOL HCL 100 MG/10ML IV SOLN
INTRAVENOUS | Status: DC | PRN
  Administered 2021-11-10: 30 mg via INTRAVENOUS
  Administered 2021-11-10: 40 mg via INTRAVENOUS
  Administered 2021-11-10: 20 mg via INTRAVENOUS
  Administered 2021-11-10: 30 mg via INTRAVENOUS

## 2021-11-10 MED ORDER — PROMETHAZINE HCL 25 MG PO TABS: 12.5000 mg | ORAL_TABLET | ORAL | Status: DC | PRN

## 2021-11-10 MED ORDER — FENTANYL CITRATE (PF) 100 MCG/2ML IJ SOLN
INTRAMUSCULAR | Status: AC
  Filled 2021-11-10: qty 2

## 2021-11-10 MED ORDER — CHLORHEXIDINE GLUCONATE CLOTH 2 % EX PADS
6.0000 | MEDICATED_PAD | Freq: Once | CUTANEOUS | Status: AC
  Administered 2021-11-10: 6 via TOPICAL

## 2021-11-10 MED ORDER — ACETAMINOPHEN 10 MG/ML IV SOLN
INTRAVENOUS | Status: DC | PRN
  Administered 2021-11-10: 1000 mg via INTRAVENOUS

## 2021-11-10 MED ORDER — LABETALOL HCL 5 MG/ML IV SOLN
INTRAVENOUS | Status: DC | PRN
  Administered 2021-11-10: 10 mg via INTRAVENOUS

## 2021-11-10 MED ORDER — 0.9 % SODIUM CHLORIDE (POUR BTL) OPTIME
TOPICAL | Status: DC | PRN
Start: 2021-11-10 — End: 2021-11-10
  Administered 2021-11-10: 3000 mL

## 2021-11-10 MED ORDER — BACITRACIN ZINC 500 UNIT/GM EX OINT
TOPICAL_OINTMENT | CUTANEOUS | Status: AC
  Filled 2021-11-10: qty 28.35

## 2021-11-10 MED ORDER — THROMBIN 5000 UNITS EX SOLR
CUTANEOUS | Status: DC | PRN
  Administered 2021-11-10 (×2): 5 mL

## 2021-11-10 MED ORDER — CLEVIDIPINE BUTYRATE 0.5 MG/ML IV EMUL
0.0000 mg/h | INTRAVENOUS | Status: DC
  Administered 2021-11-10: 6 mg/h via INTRAVENOUS

## 2021-11-10 MED ORDER — ACETAMINOPHEN 10 MG/ML IV SOLN: INTRAVENOUS | Status: DC | PRN

## 2021-11-10 MED ORDER — PROPOFOL 500 MG/50ML IV EMUL
INTRAVENOUS | Status: DC | PRN
  Administered 2021-11-10: 35 ug/kg/min via INTRAVENOUS

## 2021-11-10 MED ORDER — HYDROMORPHONE HCL 1 MG/ML IJ SOLN
INTRAMUSCULAR | Status: DC | PRN
  Administered 2021-11-10: .5 mg via INTRAVENOUS

## 2021-11-10 MED ORDER — MIDAZOLAM HCL 2 MG/2ML IJ SOLN
INTRAMUSCULAR | Status: AC
  Filled 2021-11-10: qty 2

## 2021-11-10 MED ORDER — LACTATED RINGERS IV SOLN: INTRAVENOUS | Status: DC

## 2021-11-10 MED ORDER — CHLORHEXIDINE GLUCONATE 0.12 % MT SOLN: 15.0000 mL | Freq: Once | OROMUCOSAL | Status: AC

## 2021-11-10 MED ORDER — BUPIVACAINE HCL (PF) 0.5 % IJ SOLN
INTRAMUSCULAR | Status: AC
  Filled 2021-11-10: qty 30

## 2021-11-10 MED ORDER — LIDOCAINE-EPINEPHRINE 1 %-1:100000 IJ SOLN
INTRAMUSCULAR | Status: DC | PRN
  Administered 2021-11-10: 7 mL

## 2021-11-10 MED ORDER — CLEVIDIPINE BUTYRATE 0.5 MG/ML IV EMUL
INTRAVENOUS | Status: AC
Start: 2021-11-10 — End: 2021-11-10
  Filled 2021-11-10: qty 50

## 2021-11-10 SURGICAL SUPPLY — 104 items
APL SKNCLS STERI-STRIP NONHPOA (GAUZE/BANDAGES/DRESSINGS)
BAG COUNTER SPONGE SURGICOUNT (BAG) ×2 IMPLANT
BAG SPNG CNTER NS LX DISP (BAG) ×1
BAND INSRT 18 STRL LF DISP RB (MISCELLANEOUS)
BAND RUBBER #18 3X1/16 STRL (MISCELLANEOUS) IMPLANT
BENZOIN TINCTURE PRP APPL 2/3 (GAUZE/BANDAGES/DRESSINGS) IMPLANT
BLADE CLIPPER SURG (BLADE) ×2 IMPLANT
BLADE SAW GIGLI 16 STRL (MISCELLANEOUS) IMPLANT
BLADE SURG 15 STRL LF DISP TIS (BLADE) IMPLANT
BLADE SURG 15 STRL SS (BLADE)
BLADE ULTRA TIP 2M (BLADE) ×2 IMPLANT
BNDG CMPR 75X41 PLY HI ABS (GAUZE/BANDAGES/DRESSINGS)
BNDG GAUZE ELAST 4 BULKY (GAUZE/BANDAGES/DRESSINGS) IMPLANT
BNDG STRETCH 4X75 STRL LF (GAUZE/BANDAGES/DRESSINGS) IMPLANT
BUR ACORN 6.0 PRECISION (BURR) ×2 IMPLANT
BUR ROUND FLUTED 4 SOFT TCH (BURR) IMPLANT
BUR SPIRAL ROUTER 2.3 (BUR) ×2 IMPLANT
CANISTER SUCT 3000ML PPV (MISCELLANEOUS) ×4 IMPLANT
CARTRIDGE OIL MAESTRO DRILL (MISCELLANEOUS) ×1 IMPLANT
CASSETTE SUCT IRRIG SONOPET IQ (MISCELLANEOUS) ×1 IMPLANT
CATH VENTRIC 35X38 W/TROCAR LG (CATHETERS) IMPLANT
CLIP VESOCCLUDE MED 6/CT (CLIP) IMPLANT
CNTNR URN SCR LID CUP LEK RST (MISCELLANEOUS) ×1 IMPLANT
CONT SPEC 4OZ STRL OR WHT (MISCELLANEOUS) ×2
COVER BURR HOLE 7 (Orthopedic Implant) ×2 IMPLANT
COVER BURR HOLE UNIV 10 (Orthopedic Implant) ×1 IMPLANT
COVER MAYO STAND STRL (DRAPES) IMPLANT
DECANTER SPIKE VIAL GLASS SM (MISCELLANEOUS) ×2 IMPLANT
DIFFUSER DRILL AIR PNEUMATIC (MISCELLANEOUS) ×2 IMPLANT
DRAIN SUBARACHNOID (WOUND CARE) IMPLANT
DRAPE HALF SHEET 40X57 (DRAPES) ×2 IMPLANT
DRAPE MICROSCOPE LEICA (MISCELLANEOUS) IMPLANT
DRAPE NEUROLOGICAL W/INCISE (DRAPES) ×2 IMPLANT
DRAPE STERI IOBAN 125X83 (DRAPES) IMPLANT
DRAPE SURG 17X23 STRL (DRAPES) IMPLANT
DRAPE WARM FLUID 44X44 (DRAPES) ×2 IMPLANT
DRSG ADAPTIC 3X8 NADH LF (GAUZE/BANDAGES/DRESSINGS) IMPLANT
DRSG TELFA 3X8 NADH (GAUZE/BANDAGES/DRESSINGS) ×2 IMPLANT
DURAPREP 6ML APPLICATOR 50/CS (WOUND CARE) ×2 IMPLANT
ELECT REM PT RETURN 9FT ADLT (ELECTROSURGICAL) ×2
ELECTRODE REM PT RTRN 9FT ADLT (ELECTROSURGICAL) ×1 IMPLANT
EVACUATOR 1/8 PVC DRAIN (DRAIN) IMPLANT
EVACUATOR SILICONE 100CC (DRAIN) IMPLANT
FORCEPS BIPOLAR SPETZLER 8 1.0 (NEUROSURGERY SUPPLIES) ×2 IMPLANT
GAUZE 4X4 16PLY ~~LOC~~+RFID DBL (SPONGE) IMPLANT
GAUZE SPONGE 4X4 12PLY STRL (GAUZE/BANDAGES/DRESSINGS) ×2 IMPLANT
GLOVE BIO SURGEON STRL SZ7.5 (GLOVE) IMPLANT
GLOVE BIOGEL PI IND STRL 7.0 (GLOVE) IMPLANT
GLOVE BIOGEL PI IND STRL 7.5 (GLOVE) ×2 IMPLANT
GLOVE BIOGEL PI INDICATOR 7.0 (GLOVE)
GLOVE BIOGEL PI INDICATOR 7.5 (GLOVE) ×2
GLOVE ECLIPSE 7.0 STRL STRAW (GLOVE) ×4 IMPLANT
GLOVE EXAM NITRILE XL STR (GLOVE) IMPLANT
GOWN STRL REUS W/ TWL LRG LVL3 (GOWN DISPOSABLE) ×2 IMPLANT
GOWN STRL REUS W/ TWL XL LVL3 (GOWN DISPOSABLE) IMPLANT
GOWN STRL REUS W/TWL 2XL LVL3 (GOWN DISPOSABLE) IMPLANT
GOWN STRL REUS W/TWL LRG LVL3 (GOWN DISPOSABLE) ×4
GOWN STRL REUS W/TWL XL LVL3 (GOWN DISPOSABLE)
HEMOSTAT POWDER KIT SURGIFOAM (HEMOSTASIS) ×3 IMPLANT
HEMOSTAT SURGICEL 2X14 (HEMOSTASIS) ×2 IMPLANT
HOOK DURA 1/2IN (MISCELLANEOUS) ×2 IMPLANT
IV NS 1000ML (IV SOLUTION) ×2
IV NS 1000ML BAXH (IV SOLUTION) ×1 IMPLANT
KIT BASIN OR (CUSTOM PROCEDURE TRAY) ×2 IMPLANT
KIT DRAIN CSF ACCUDRAIN (MISCELLANEOUS) IMPLANT
KIT TURNOVER KIT B (KITS) ×2 IMPLANT
KNIFE ARACHNOID DISP AM-24-S (MISCELLANEOUS) ×2 IMPLANT
MARKER SPHERE PSV REFLC 13MM (MARKER) ×4 IMPLANT
NDL SPNL 18GX3.5 QUINCKE PK (NEEDLE) IMPLANT
NEEDLE HYPO 22GX1.5 SAFETY (NEEDLE) ×2 IMPLANT
NEEDLE SPNL 18GX3.5 QUINCKE PK (NEEDLE) IMPLANT
NS IRRIG 1000ML POUR BTL (IV SOLUTION) ×6 IMPLANT
OIL CARTRIDGE MAESTRO DRILL (MISCELLANEOUS) ×2
PACK BATTERY CMF DISP FOR DVR (ORTHOPEDIC DISPOSABLE SUPPLIES) ×1 IMPLANT
PACK CRANIOTOMY CUSTOM (CUSTOM PROCEDURE TRAY) ×2 IMPLANT
PAD DRESSING TELFA 3X8 NADH (GAUZE/BANDAGES/DRESSINGS) IMPLANT
PATTIES SURGICAL .25X.25 (GAUZE/BANDAGES/DRESSINGS) IMPLANT
PATTIES SURGICAL .5 X.5 (GAUZE/BANDAGES/DRESSINGS) IMPLANT
PATTIES SURGICAL .5 X3 (DISPOSABLE) IMPLANT
PATTIES SURGICAL 1/4 X 3 (GAUZE/BANDAGES/DRESSINGS) IMPLANT
PATTIES SURGICAL 1X1 (DISPOSABLE) IMPLANT
PIN MAYFIELD SKULL DISP (PIN) ×2 IMPLANT
SCREW UNIII AXS SD 1.5X4 (Screw) ×7 IMPLANT
SPECIMEN JAR SMALL (MISCELLANEOUS) IMPLANT
SPONGE NEURO XRAY DETECT 1X3 (DISPOSABLE) IMPLANT
SPONGE SURGIFOAM ABS GEL 100 (HEMOSTASIS) ×4 IMPLANT
STAPLER VISISTAT 35W (STAPLE) ×2 IMPLANT
STOCKINETTE 6  STRL (DRAPES)
STOCKINETTE 6 STRL (DRAPES) IMPLANT
SUT ETHILON 3 0 FSL (SUTURE) IMPLANT
SUT ETHILON 3 0 PS 1 (SUTURE) IMPLANT
SUT NURALON 4 0 TR CR/8 (SUTURE) ×6 IMPLANT
SUT SILK 0 TIES 10X30 (SUTURE) IMPLANT
SUT VIC AB 0 CT1 18XCR BRD8 (SUTURE) ×2 IMPLANT
SUT VIC AB 0 CT1 8-18 (SUTURE) ×4
SUT VIC AB 3-0 SH 8-18 (SUTURE) ×4 IMPLANT
TAPE CLOTH 1X10 TAN NS (GAUZE/BANDAGES/DRESSINGS) ×2 IMPLANT
TIP TISSUE SONOPET IQ STD 12 (TIP) ×1 IMPLANT
TOWEL GREEN STERILE (TOWEL DISPOSABLE) ×2 IMPLANT
TOWEL GREEN STERILE FF (TOWEL DISPOSABLE) ×2 IMPLANT
TRAY FOLEY MTR SLVR 16FR STAT (SET/KITS/TRAYS/PACK) ×2 IMPLANT
TUBE CONNECTING 12X1/4 (SUCTIONS) ×2 IMPLANT
UNDERPAD 30X36 HEAVY ABSORB (UNDERPADS AND DIAPERS) ×2 IMPLANT
WATER STERILE IRR 1000ML POUR (IV SOLUTION) ×2 IMPLANT

## 2021-11-10 NOTE — Anesthesia Procedure Notes (Signed)
Arterial Line Insertion Start/End5/19/2023 2:30 AM, 11/10/2021 2:40 AM Performed by: Rande Brunt, CRNA  Patient location: Pre-op. Preanesthetic checklist: patient identified, IV checked, site marked, risks and benefits discussed, surgical consent, monitors and equipment checked, pre-op evaluation, timeout performed and anesthesia consent Lidocaine 1% used for infiltration Right, radial was placed Catheter size: 20 G Hand hygiene performed  and maximum sterile barriers used   Attempts: 1 Procedure performed without using ultrasound guided technique. Following insertion, Biopatch and dressing applied. Patient tolerated the procedure well with no immediate complications.

## 2021-11-10 NOTE — Progress Notes (Signed)
OT Cancellation Note  Patient Details Name: KATANA BERTHOLD MRN: [id number] DOB: 10-07-1975   Cancelled Treatment:    Reason Eval/Treat Not Completed: Patient at procedure or test/ unavailable. Pt. Is out of the building at [facility] for apt/procedure.    Tanya Nones 11/10/2021, 11:52 AM

## 2021-11-10 NOTE — TOC Initial Note (Signed)
Transition of Care [facility]) - Initial/Assessment Note    Patient Details  Name: Sheryl Mcmillan MRN: [id number] Date of Birth: 1975/12/25  Transition of [facility]) CM/SW Contact:    Pollie Friar, RN Phone Number: 11/10/2021, 8:23 AM  Clinical Narrative:                 Patient is from home with her daughter. Her daughter works from 4 pm until about 11 pm. Patients friend can provide supervision starting about 6 pm. Daughter and patient requesting PCS services to assist with ADL's at home and to provide supervision during the time she would be alone. CM has down loaded the form from the [employer] site and will fax in: [phone]. CM also verified with the representative througj [employer] that this was what needed to be done to obtain auth for [facility] services.  TOC will follow for further d/c needs post OR. PT/OT will need to reassess. Pt already has a hospital bed at home.    Expected Discharge Plan: OP Rehab Barriers to Discharge: Continued Medical Work up   Patient Goals and CMS Choice     Choice offered to / list presented to : Patient, Adult Children  Expected Discharge Plan and Services Expected Discharge Plan: OP Rehab   Discharge Planning Services: CM Consult   Living arrangements for the past 2 months: Apartment                                      Prior Living Arrangements/Services Living arrangements for the past 2 months: Apartment Lives with:: Adult Children Patient language and need for interpreter reviewed:: Yes Do you feel safe going back to the place where you live?: Yes      Need for Family Participation in Patient Care: Yes (Comment) Care giver support system in place?: Yes (comment)   Criminal Activity/Legal Involvement Pertinent to Current Situation/Hospitalization: No - Comment as needed  Activities of Daily Living      Permission Sought/Granted                  Emotional Assessment Appearance:: Appears stated  age Attitude/Demeanor/Rapport: Engaged Affect (typically observed): Accepting Orientation: : Oriented to Self, Oriented to Place   Psych Involvement: No (comment)  Admission diagnosis:  Brain mass [G93.89] Vasogenic edema ([place]) [G93.6] Brain tumor [facility]) [D49.6] Patient Active Problem List   Diagnosis Date Noted   Brain tumor ([place]) 11/08/2021   Metastatic cancer to brain ([place]) 11/08/2021   Cerebral edema ([place]) 11/08/2021   Complex partial seizure ([place]) 11/08/2021   Brain mass 11/07/2021   Right sided weakness 11/07/2021   IDA (iron deficiency anemia) 07/18/2021   Cholelithiasis 03/14/2021   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

## 2021-11-10 NOTE — Progress Notes (Addendum)
Patient brought from OR. Began neuro assessment with CRNA at bedside. Patient not able to squeeze right hang or move right leg/foot. Patient drowsy, but able to follow commands and move left side. Called Dr. Ola Spurr to bedside, who went and got Dr. Kathyrn Sheriff to bedside. Dr. Kathyrn Sheriff assessed patient and reported "this is to be expected" and "no need to call a code stroke". Reports that "this could take days or weeks to resolve". No new orders.

## 2021-11-10 NOTE — Progress Notes (Signed)
  NEUROSURGERY PROGRESS NOTE   No issues overnight. Pt without complaint this am.  EXAM:  BP 113/65 (BP Location: Right Wrist)   Pulse 86   Temp 97.9 F (36.6 C) (Oral)   Resp 18   Ht $R'5\' 11"'iz$  (1.803 m)   Wt (!) 143 kg   LMP 09/29/2020 (Approximate)   SpO2 98%   BMI 43.96 kg/m   Awake, alert, oriented  Speech fluent, appropriate  CN grossly intact  5/5 BUE/BLE, stable right pronator drift  IMPRESSION:  46 y.o. female with solitary left posterior frontal lesion, likely breast CA met, neurologically stable.  PLAN: - Preop SRS at [facility] this am followed by surgical resection this pm  I have reviewed the imaging findings with the patient.  We have discussed treatment options for the brain tumor including our plan for stereotactic radiosurgery preoperatively, followed by surgical resection.  I reviewed with her the details of the operation as well as the expected postoperative course and recovery.  Specifically we have discussed the risks of surgery to include possibility of right-sided weakness given the location and the supplementary motor area.  I did explain to her that right-sided weakness might even be likely however there is a good chance for recovery with time and physical and Occupational Therapy.  Other risks include seizure, bleeding, infection, and hydrocephalus.  We also discussed general risks of anesthesia including heart attack, stroke, and blood clots.  Patient appeared to understand our discussion and is ready to proceed with the plan above.  All her questions today were answered.  She provided informed consent to proceed with surgery.   Consuella Lose, MD [facility]

## 2021-11-10 NOTE — Transfer of Care (Signed)
Immediate Anesthesia Transfer of Care Note  Patient: Sheryl Mcmillan  Procedure(s) Performed: LT FRONTAL CRANIOTOMY TUMOR EXCISION (Left: Head) APPLICATION OF CRANIAL NAVIGATION (Left: Head)  Patient Location: PACU  Anesthesia Type:General  Level of Consciousness: drowsy, patient cooperative and responds to stimulation  Airway & Oxygen Therapy: Patient Spontanous Breathing  Post-op Assessment: Report given to RN; patient not moving on R side. Surgeon called to bedside and states this is expected.   Post vital signs: Reviewed and stable  Last Vitals:  Vitals Value Taken Time  BP 139/79 11/10/21 1834  Temp    Pulse 85 11/10/21 1846  Resp 18 11/10/21 1846  SpO2 96 % 11/10/21 1846  Vitals shown include unvalidated device data.  Last Pain:  Vitals:   11/10/21 1433  TempSrc: Oral  PainSc: 4       Patients Stated Pain Goal: 3 (11/88/67 7373)  Complications: No notable events documented.

## 2021-11-10 NOTE — Anesthesia Preprocedure Evaluation (Signed)
Anesthesia Evaluation  Patient identified by MRN, date of birth, ID band Patient awake    Reviewed: Allergy & Precautions, NPO status , Patient's Chart, lab work & pertinent test results  History of Anesthesia Complications (+) PONV and history of anesthetic complications  Airway Mallampati: II  TM Distance: >3 FB Neck ROM: Full    Dental  (+) Dental Advisory Given   Pulmonary former smoker,    breath sounds clear to auscultation       Cardiovascular negative cardio ROS   Rhythm:Regular Rate:Normal     Neuro/Psych  Headaches, Seizures -,   Neuromuscular disease    GI/Hepatic Neg liver ROS, GERD  ,  Endo/Other  negative endocrine ROS  Renal/GU negative Renal ROS     Musculoskeletal  (+) Arthritis ,   Abdominal   Peds  Hematology negative hematology ROS (+)   Anesthesia Other Findings   Reproductive/Obstetrics                             Lab Results  Component Value Date   WBC 9.7 11/08/2021   HGB 13.6 11/08/2021   HCT 41.1 11/08/2021   MCV 84.0 11/08/2021   PLT 329 11/08/2021   Lab Results  Component Value Date   CREATININE 0.73 11/08/2021   BUN 9 11/08/2021   NA 140 11/08/2021   K 4.3 11/08/2021   CL 107 11/08/2021   CO2 20 (L) 11/08/2021    Anesthesia Physical Anesthesia Plan  ASA: 3  Anesthesia Plan: General   Post-op Pain Management: Ofirmev IV (intra-op)*   Induction: Intravenous  PONV Risk Score and Plan: 4 or greater and Dexamethasone, Ondansetron and Treatment may vary due to age or medical condition  Airway Management Planned: Oral ETT  Additional Equipment: Arterial line  Intra-op Plan:   Post-operative Plan: Extubation in OR and Possible Post-op intubation/ventilation  Informed Consent: I have reviewed the patients History and Physical, chart, labs and discussed the procedure including the risks, benefits and alternatives for the proposed anesthesia  with the patient or authorized representative who has indicated his/her understanding and acceptance.     Dental advisory given  Plan Discussed with: CRNA  Anesthesia Plan Comments:         Anesthesia Quick Evaluation

## 2021-11-10 NOTE — Plan of Care (Signed)
  Problem: Education: Goal: Knowledge of General Education information will improve Description: Including pain rating scale, medication(s)/side effects and non-pharmacologic comfort measures Outcome: Progressing   Problem: Health Behavior/Discharge Planning: Goal: Ability to manage health-related needs will improve Outcome: Progressing   Problem: Activity: Goal: Risk for activity intolerance will decrease Outcome: Progressing   Problem: Coping: Goal: Level of anxiety will decrease Outcome: Progressing   Problem: Pain Managment: Goal: General experience of comfort will improve Outcome: Progressing   Problem: Skin Integrity: Goal: Risk for impaired skin integrity will decrease Outcome: Progressing

## 2021-11-10 NOTE — Anesthesia Procedure Notes (Signed)
Procedure Name: Intubation Date/Time: 11/10/2021 3:37 PM Performed by: Rande Brunt, CRNA Pre-anesthesia Checklist: Patient identified, Emergency Drugs available, Suction available and Patient being monitored Patient Re-evaluated:Patient Re-evaluated prior to induction Oxygen Delivery Method: Circle System Utilized Preoxygenation: Pre-oxygenation with 100% oxygen Induction Type: IV induction Ventilation: Mask ventilation without difficulty Laryngoscope Size: Miller and 2 Grade View: Grade I Tube type: Oral Number of attempts: 1 Airway Equipment and Method: Stylet and Oral airway Placement Confirmation: ETT inserted through vocal cords under direct vision, positive ETCO2 and breath sounds checked- equal and bilateral Secured at: 21 cm Tube secured with: Tape Dental Injury: Teeth and Oropharynx as per pre-operative assessment

## 2021-11-10 NOTE — Progress Notes (Signed)
  Progress Note   Patient: Sheryl Mcmillan UMN:[id number] DOB: 11-Nov-1975 DOA: 11/07/2021     2        Brief hospital course: Mrs. Arvizu is a 46 y.o. F with hx BRCA1 mutation and IIA inv ductal BrCA s/p neoadjuvant chemo, bilateral mastectomy and hysterectomy who presented with intermittent right sided weakness.    In the ER, MRI brain showed a left parasagittal brain mass.  Neurosurgery were consulted.  They arranged for expedited pre-surgical SRS and plan for resection of the tumor on 5/19.     Assessment and Plan: * Brain mass Right sided weakness due to focal seizures due to cerebral edema due to likely brain metastasis from Malignant neoplasm of upper-outer quadrant of left breast in female, estrogen receptor positive Per Neurosurgery.   Morbid obesity with BMI of 40.0-44.9, adult ([place]) BMI 43  Patient is without significant comorbidity other than morbid obesity, and has no active or chronic major organ dysfunction.    After surgery, the hospitalist service will sign off and the patient will be transferred to the ICU under the Neurosurgery service.  Please contact us if we can be of assistance after transfer out of the intensive care unit.                  Physical Exam: Vitals:   11/09/21 2010 11/09/21 2355 11/10/21 0431 11/10/21 0936  BP: (!) 135/96 113/81 113/65 (!) 151/96  Pulse: 78 72 86 69  Resp: $Remo'20 18 18 20  'cqejw$ Temp: 98.2 F (36.8 C) 97.7 F (36.5 C) 97.9 F (36.6 C) 97.7 F (36.5 C)  TempSrc: Oral Oral Oral Oral  SpO2: 100% 97% 98% 100%  Weight:      Height:             Data Reviewed: This is a no charge note.            Author: Edwin Dada, MD 11/10/2021 1:57 PM  For on call review [email or web address].

## 2021-11-10 NOTE — Op Note (Signed)
NEUROSURGERY OPERATIVE NOTE   PREOP DIAGNOSIS:  Left frontal brain tumor Breast CA   POSTOP DIAGNOSIS: Same  PROCEDURE: Stereotactic left frontal craniotomy, interhermispheric approach for resection of tumor Use of intraoperative microscope  SURGEON: Dr. Consuella Lose, MD  ASSISTANT: Dr. Ashok Pall, MD  ANESTHESIA: General Endotracheal  EBL: 150cc  SPECIMENS: Left frontal tumor for permanent pathology  DRAINS: None  COMPLICATIONS: None  CONDITION: Hemodynamically stable to PACU  HISTORY: Sheryl Mcmillan is a 46 y.o. female H admitted to the hospital after onset of multiple episodes of transient right-sided arm weakness.  Work-up included CT scan demonstrating a likely posterior left frontal mass with MRI confirming the presence of a homogeneously enhancing lesion within the posterior aspect of the left frontal lobe in the supplementary motor area, just anterior to the precentral gyrus.  Patient does have a history of breast cancer diagnosed 18 months ago.  With the size and location of the lesion and associated edema and likely focal seizures, I did recommend surgical resection with preoperative stereotactic radiosurgery.  The risks, benefits, and alternatives to surgery were all reviewed in detail with the patient.  After all her questions were answered informed consent was obtained and witnessed.  The patient did undergo stereotactic radiosurgery preoperatively to a dose of 18 Gray in a single fraction.  PROCEDURE IN DETAIL: The patient was brought to the operating room. After induction of general anesthesia, the patient was positioned on the operative table in the [place] head holder in the supine position. All pressure points were meticulously padded.  Preoperative stereotactic MRI scan was then coregistered with surface markers until a satisfactory accuracy was achieved.  The stereotactic system was then used to mark out the surface projection of the superior sagittal  sinus and the underlying left frontal tumor.  A sigmoid shaped left paramedian incision was then marked out.  After timeout was conducted, the incision was infiltrated with local anesthetic with epinephrine.  Incision was then made sharply and carried down through the galea.  Periosteum was then incised and subperiosteal dissection was carried out.  Self-retaining retractors were placed.  Stereotactic system was used to identify the underlying superior sagittal sinus.  I then planned out a left paramedian craniotomy.  Bur holes were created, 2 just off the midline, and one more laterally.  These were connected with the craniotome.  Single piece frontal flap was then elevated.  Kerrison punches and the high-speed drill were then used to extend the craniotomy more medially to identify the edge of the superior sagittal sinus.  Bleeding was controlled with Gelfoam with thrombin.  Dura was then incised and reflected medially with 4-0 Nurolon stitches.  At this point the microscope was draped sterilely and brought into the field and the remainder of the resection was done under the microscope using microdissection technique.  Initially, the stereotactic system was used to identify a trajectory to the left frontal tumor.  I was able to gently retract the left frontal lobe away from the falx.  There were 2 small bridging veins directly overlying the planned resection corridor which were coagulated and divided.  Once I had access to the medial portion of the left frontal lobe, bipolar electrocautery was used to create a small corticectomy.  The tumor was noted immediately subjacent, slightly more pale in color and more firm and consistency.  Utilizing the bipolar electrocautery and the ultrasonic aspirator, the tumor was dissected away from the surrounding white matter, initially in the anterior, superior, and posterior margins,  followed by the more lateral and inferior margins.  The tumor was then removed en bloc and  sent for permanent pathology.  At this point, the resection cavity was inspected and I did not identify any remaining tumor.  The wound was irrigated with a copious amount of normal saline irrigation.  Hemostasis was secured on the operative bed using a combination of morselized Gelfoam with thrombin and bipolar electrocautery.  At this point, no active bleeding was identified.  The dura was then reapproximated with interrupted 4-0 Nurolon stitches.  Hemostasis on the epidural plane was easily secured with morselized Gelfoam with thrombin as well as small pledgets of standard thrombin soaked Gelfoam.  The dura was then covered with a large sheet of Gelfoam.  The bone flap was then replaced and plated with standard titanium plates and screws.  The wound was again irrigated.  [place] was closed with interrupted 3-0 Vicryl stitches and the skin was closed with staples.  Bacitracin ointment and sterile dressing was applied.  At the end of the case all sponge, needle, instrument, and cottonoid counts were correct.  Patient was then extubated and transferred to the stretcher and taken to the postanesthesia care unit in stable hemodynamic condition.   Consuella Lose, MD [facility]

## 2021-11-10 NOTE — Anesthesia Postprocedure Evaluation (Signed)
Anesthesia Post Note  Patient: Sheryl Mcmillan  Procedure(s) Performed: LT FRONTAL CRANIOTOMY TUMOR EXCISION (Left: Head) APPLICATION OF CRANIAL NAVIGATION (Left: Head)     Patient location during evaluation: PACU Anesthesia Type: General Level of consciousness: awake and alert Pain management: pain level controlled Vital Signs Assessment: post-procedure vital signs reviewed and stable Respiratory status: spontaneous breathing, nonlabored ventilation and respiratory function stable Cardiovascular status: blood pressure returned to baseline and stable Postop Assessment: no apparent nausea or vomiting Anesthetic complications: no   No notable events documented.  Last Vitals:  Vitals:   11/10/21 2045 11/10/21 2100  BP: (!) 158/98 (!) 174/88  Pulse: 79 72  Resp: 12 12  Temp:    SpO2: 98% 97%    Last Pain:  Vitals:   11/10/21 2030  TempSrc: Oral  PainSc: 6                  Avner Stroder,W. EDMOND

## 2021-11-11 ENCOUNTER — Inpatient Hospital Stay (HOSPITAL_COMMUNITY): Payer: Medicaid Other

## 2021-11-11 MED ORDER — PANTOPRAZOLE SODIUM 40 MG PO TBEC
40.0000 mg | DELAYED_RELEASE_TABLET | Freq: Every day | ORAL | Status: DC
Start: 2021-11-11 — End: 2021-11-17
  Administered 2021-11-11 – 2021-11-16 (×6): 40 mg via ORAL
  Filled 2021-11-11 (×6): qty 1

## 2021-11-11 NOTE — Progress Notes (Signed)
Patient ID: Sheryl Mcmillan, female   DOB: 1975/11/16, 46 y.o.   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

## 2021-11-11 NOTE — Plan of Care (Signed)
  Problem: Education: Goal: Knowledge of General Education information will improve Description: Including pain rating scale, medication(s)/side effects and non-pharmacologic comfort measures Outcome: Progressing   Problem: Health Behavior/Discharge Planning: Goal: Ability to manage health-related needs will improve Outcome: Progressing   Problem: Clinical Measurements: Goal: Ability to maintain clinical measurements within normal limits will improve Outcome: Progressing   Problem: Activity: Goal: Risk for activity intolerance will decrease Outcome: Progressing   Problem: Nutrition: Goal: Adequate nutrition will be maintained Outcome: Progressing   Problem: Coping: Goal: Level of anxiety will decrease Outcome: Progressing   Problem: Pain Managment: Goal: General experience of comfort will improve Outcome: Progressing   Problem: Elimination: Goal: Will not experience complications related to bowel motility Outcome: Progressing   Problem: Skin Integrity: Goal: Risk for impaired skin integrity will decrease Outcome: Progressing

## 2021-11-12 ENCOUNTER — Encounter (HOSPITAL_COMMUNITY): Payer: Self-pay | Admitting: Neurosurgery

## 2021-11-12 ENCOUNTER — Inpatient Hospital Stay (HOSPITAL_COMMUNITY): Payer: Medicaid Other

## 2021-11-12 MED ORDER — IOHEXOL 300 MG/ML  SOLN
100.0000 mL | Freq: Once | INTRAMUSCULAR | Status: AC | PRN
  Administered 2021-11-12: 100 mL via INTRAVENOUS

## 2021-11-12 MED ORDER — GADOPICLENOL 0.5 MMOL/ML IV SOLN
10.0000 mL | Freq: Once | INTRAVENOUS | Status: AC | PRN
  Administered 2021-11-12: 10 mL via INTRAVENOUS

## 2021-11-12 NOTE — Plan of Care (Signed)
  Problem: Education: Goal: Knowledge of General Education information will improve Description: Including pain rating scale, medication(s)/side effects and non-pharmacologic comfort measures 11/12/2021 0149 by Lennox Grumbles, RN Outcome: Progressing 11/11/2021 2145 by Lennox Grumbles, RN Outcome: Progressing   Problem: Health Behavior/Discharge Planning: Goal: Ability to manage health-related needs will improve 11/12/2021 0149 by Lennox Grumbles, RN Outcome: Progressing 11/11/2021 2145 by Lennox Grumbles, RN Outcome: Progressing   Problem: Clinical Measurements: Goal: Ability to maintain clinical measurements within normal limits will improve 11/12/2021 0149 by Lennox Grumbles, RN Outcome: Progressing 11/11/2021 2145 by Lennox Grumbles, RN Outcome: Progressing   Problem: Activity: Goal: Risk for activity intolerance will decrease 11/12/2021 0149 by Lennox Grumbles, RN Outcome: Progressing 11/11/2021 2145 by Lennox Grumbles, RN Outcome: Progressing   Problem: Nutrition: Goal: Adequate nutrition will be maintained 11/12/2021 0149 by Lennox Grumbles, RN Outcome: Progressing 11/11/2021 2145 by Lennox Grumbles, RN Outcome: Progressing   Problem: Coping: Goal: Level of anxiety will decrease Outcome: Progressing   Problem: Elimination: Goal: Will not experience complications related to bowel motility Outcome: Progressing   Problem: Pain Managment: Goal: General experience of comfort will improve 11/12/2021 0149 by Lennox Grumbles, RN Outcome: Progressing 11/11/2021 2145 by Lennox Grumbles, RN Outcome: Progressing   Problem: Skin Integrity: Goal: Risk for impaired skin integrity will decrease 11/12/2021 0149 by Lennox Grumbles, RN Outcome: Progressing 11/11/2021 2145 by Lennox Grumbles, RN Outcome: Progressing

## 2021-11-12 NOTE — Progress Notes (Signed)
Patient ID: Sheryl Mcmillan, female   DOB: 1975/12/21, 46 y.o.   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  Doing well, will need time with therapy for right plegia

## 2021-11-12 NOTE — Evaluation (Signed)
Clinical/Bedside Swallow Evaluation Patient Details  Name: Sheryl Mcmillan MRN: [id number] Date of Birth: [birth year]  Today's Date: 11/12/2021 Time: SLP Start Time (ACUTE ONLY): 1650 SLP Stop Time (ACUTE ONLY): 1705 SLP Time Calculation (min) (ACUTE ONLY): 15 min  Past Medical History:  Past Medical History:  Diagnosis Date   Arthritis    Breast cancer ([place])    Family history of non-Hodgkin's lymphoma 06/01/2020   GERD (gastroesophageal reflux disease)    Hemorrhoids    Migraines    PCOS (polycystic ovarian syndrome)    PONV (postoperative nausea and vomiting)    Past Surgical History:  Past Surgical History:  Procedure Laterality Date   APPLICATION OF CRANIAL NAVIGATION Left 11/10/2021   Procedure: APPLICATION OF CRANIAL NAVIGATION;  Surgeon: Consuella Lose, MD;  Location: [place];  Service: Neurosurgery;  Laterality: Left;   AXILLARY SENTINEL NODE BIOPSY Left 11/07/2020   Procedure: LEFT AXILLARY SENTINEL NODE BIOPSY;  Surgeon: [name], MD;  Location: [place];  Service: General;  Laterality: Left;   BREAST CYST EXCISION Right 09/22/2021   Procedure: Excision of right axillary excess soft tissue.;  Surgeon: [name], MD;  Location: [place];  Service: Plastics;  Laterality: Right;   BREAST RECONSTRUCTION WITH PLACEMENT OF TISSUE EXPANDER AND FLEX HD (ACELLULAR HYDRATED DERMIS) Bilateral 11/07/2020   Procedure: BILATERAL BREAST RECONSTRUCTION WITH PLACEMENT OF TISSUE EXPANDER AND FLEX HD (ACELLULAR HYDRATED DERMIS);  Surgeon: [name], MD;  Location: [place];  Service: Plastics;  Laterality: Bilateral;   COSMETIC SURGERY     CRANIOTOMY Left 11/10/2021   Procedure: LT FRONTAL CRANIOTOMY TUMOR EXCISION;  Surgeon: Consuella Lose, MD;  Location: [place];  Service: Neurosurgery;  Laterality: Left;   DILATION AND CURETTAGE OF UTERUS     HEMORRHOID SURGERY     IR IMAGING GUIDED PORT INSERTION  06/15/2020   LIPOSUCTION WITH LIPOFILLING Bilateral  09/22/2021   Procedure: Fat grafting bilateral breast;  Surgeon: [name], MD;  Location: [place];  Service: Plastics;  Laterality: Bilateral;   MASS EXCISION Bilateral 04/25/2021   Procedure: excision of bilateral axillary breast tissue;  Surgeon: [name], MD;  Location: [place];  Service: Plastics;  Laterality: Bilateral;   PORT-A-CATH REMOVAL Right 11/07/2020   Procedure: REMOVAL PORT-A-CATH;  Surgeon: [name], MD;  Location: [place];  Service: General;  Laterality: Right;   REMOVAL OF BILATERAL TISSUE EXPANDERS WITH PLACEMENT OF BILATERAL BREAST IMPLANTS Bilateral 04/25/2021   Procedure: REMOVAL OF BILATERAL TISSUE EXPANDERS WITH PLACEMENT OF BILATERAL BREAST IMPLANTS;  Surgeon: [name], MD;  Location: [place];  Service: Plastics;  Laterality: Bilateral;   ROBOTIC ASSISTED TOTAL HYSTERECTOMY WITH BILATERAL SALPINGO OOPHERECTOMY Bilateral 03/14/2021   Procedure: XI ROBOTIC ASSISTED TOTAL HYSTERECTOMY WITH BILATERAL SALPINGO OOPHORECTOMY;  Surgeon: Everitt Amber, MD;  Location: [facility];  Service: Gynecology;  Laterality: Bilateral;   TOTAL MASTECTOMY Bilateral 11/07/2020   Procedure: BILATERAL MASTECTOMY;  Surgeon: [name], MD;  Location: [place];  Service: General;  Laterality: Bilateral;   HPI:  Patient is a 46 y.o. female with PMH: left breast cancer s/p neoadjuvant chemotherapy, bilateral mastectomy, hysterectomy. She presented to the ED for evaluation of right sided weakness. CT head showed 2.0cm x 2.cm parenchymal mass within the posteromedial left frontal lobe and moderate surrounding vasogenic edema with resultant mass effect, partial effacement of the left ventricle and trace rightward midline shift. Neurosurgery consulted and recommendation was for starting on IV Decadron 4 mg every  6 hours, IV Keppra 500 mg twice daily.  She underwent a left frontal craniotomy tumor excision on 5/19. She was intubated for  surgery only. She has been tolerating a regular solids, thin liquids diet but patient has complained of difficulty swallowing, prompting SLP swallow eval order.    Assessment / Plan / Recommendation  Clinical Impression  Patient presenting with clinical s/s of dysphagia as per this bedside/clinical swallow evaluation. Patient was slow to respond and did appear lethargic/fatigued but she was able to describe her symptoms. She reported that she is having trouble swallowing foods that are "thick" and ones that are hard to masticate. She gave examples of omlette being too crispy for her to chew and mashed potatoes being too thick/dense. SLP observed patient with PO's of straw sips of water and although she exhibited a suspected swallow initiation delay, no coughing or throat clearing observed. She did report that she felt the liquids to be traveling mainly down the left side of her throat. SLP recommended that patient continue on regular texture solids but for her and family to order softer solids that she can tolerate. In addition, recommendation was made for patient to have other options like applesauce, yogurt, etc on her meal trays in case she is not able to chew and swallow the main course. SLP will plan to f/u next 1-2 dates to ensure diet toleration. SLP Visit Diagnosis: Dysphagia, unspecified (R13.10)    Aspiration Risk  Mild aspiration risk;No limitations    Diet Recommendation Regular;Thin liquid   Liquid Administration via: Cup;Straw Medication Administration: Whole meds with liquid Supervision: Staff to assist with self feeding;Full supervision/cueing for compensatory strategies Compensations: Slow rate;Small sips/bites;Minimize environmental distractions Postural Changes: Seated upright at 90 degrees    Other  Recommendations Oral Care Recommendations: Oral care BID;Staff/trained caregiver to provide oral care    Recommendations for follow up therapy are one component of a  multi-disciplinary discharge planning process, led by the attending physician.  Recommendations may be updated based on patient status, additional functional criteria and insurance authorization.  Follow up Recommendations Other (comment) (TBD)      Assistance Recommended at Discharge Frequent or constant Supervision/Assistance  Functional Status Assessment Patient has had a recent decline in their functional status and demonstrates the ability to make significant improvements in function in a reasonable and predictable amount of time.  Frequency and Duration min 1 x/week  1 week       Prognosis Prognosis for Safe Diet Advancement: Good      Swallow Study   General Date of Onset: 11/12/21 HPI: Patient is a 46 y.o. female with PMH: left breast cancer s/p neoadjuvant chemotherapy, bilateral mastectomy, hysterectomy. She presented to the ED for evaluation of right sided weakness. CT head showed 2.0cm x 2.cm parenchymal mass within the posteromedial left frontal lobe and moderate surrounding vasogenic edema with resultant mass effect, partial effacement of the left ventricle and trace rightward midline shift. Neurosurgery consulted and recommendation was for starting on IV Decadron 4 mg every 6 hours, IV Keppra 500 mg twice daily.  She underwent a left frontal craniotomy tumor excision on 5/19. She was intubated for surgery only. She has been tolerating a regular solids, thin liquids diet but patient has complained of difficulty swallowing, prompting SLP swallow eval order. Type of Study: Bedside Swallow Evaluation Previous Swallow Assessment: none found Diet Prior to this Study: Regular;Thin liquids Temperature Spikes Noted: No Respiratory Status: Room air History of Recent Intubation: Yes Length of Intubations (days):  (for  procedure only) Date extubated: 11/10/20 Behavior/Cognition: Alert;Cooperative;Pleasant mood;Lethargic/Drowsy Oral Cavity Assessment: Within Functional Limits Oral  Care Completed by SLP: Recent completion by staff Oral Cavity - Dentition: Edentulous;Other (Comment) (she was recently fitted with dentures but had not picked them up yet) Vision: Functional for self-feeding Self-Feeding Abilities: Needs set up;Needs assist Patient Positioning: Upright in bed Baseline Vocal Quality: Normal Volitional Cough: Strong Volitional Swallow: Able to elicit    Oral/Motor/Sensory Function Overall Oral Motor/Sensory Function: Generalized oral weakness Lingual ROM: Within Functional Limits Lingual Strength: Reduced   Ice Chips     Thin Liquid Thin Liquid: Impaired Presentation: Straw Pharyngeal  Phase Impairments: Suspected delayed Swallow Other Comments: mild swallow initiation delay suspected, no overt s/s aspiration or penetration    Nectar Thick     Honey Thick     Puree Puree: Not tested   Solid     Solid: Not tested     Sonia Baller, MA, CCC-SLP Speech Therapy

## 2021-11-12 NOTE — Progress Notes (Signed)
Pt started drinking 1st bottle of contrast at this time.

## 2021-11-13 ENCOUNTER — Inpatient Hospital Stay: Payer: Medicaid Other | Attending: Oncology

## 2021-11-13 MED ORDER — CLONIDINE HCL 0.1 MG PO TABS
0.1000 mg | ORAL_TABLET | Freq: Two times a day (BID) | ORAL | Status: DC
  Administered 2021-11-14 – 2021-11-17 (×8): 0.1 mg via ORAL
  Filled 2021-11-13 (×8): qty 1

## 2021-11-13 MED ORDER — HYDRALAZINE HCL 20 MG/ML IJ SOLN
10.0000 mg | INTRAMUSCULAR | Status: DC | PRN
  Administered 2021-11-13 (×2): 10 mg via INTRAVENOUS
  Filled 2021-11-13 (×2): qty 1

## 2021-11-13 MED FILL — Thrombin For Soln 20000 Unit: CUTANEOUS | Qty: 1 | Status: AC

## 2021-11-13 MED FILL — Thrombin For Soln 5000 Unit: CUTANEOUS | Qty: 5000 | Status: AC

## 2021-11-13 NOTE — Evaluation (Signed)
Physical Therapy Re- Evaluation Patient Details Name: Sheryl Mcmillan MRN: [id number] DOB: July 22, 1975 Today's Date: 11/13/2021  History of Present Illness  Pt is a 46 y.o. female who presented to the ED 5/16 with R side weakness.  MRI revealed partially solid contrast-enhancing mass in the superior left hemisphere with large area of vasogenic edema, most consistent with  metastatic disease.PMH: left breast cancer (ER negative, PR positive, BRCA1 positive) s/p neoadjuvant chemotherapy, bilateral mastectomy, hysterectomy and bilateral SOO  Clinical Impression  Pt admitted with above diagnosis. Pt with significant decline in function since surgery, re-eval performed today. Pt with 0/5 mvmt noted throughout RLE. Pt with stiffness R ankle and knee. Requested PRAFO's to prevent ankle sontracture. Pt needed max A to come to EOB with initial posterior LOB. With time pt able to maintain balance with min-guard A. Worked on wt shifting and ROM to R side. Unable to progress transfers or ambulation at this time due to inability to activate R side. Recommend maxisky use for bed to chair. Requesting AIR consult for pt to achieve maximal recovery.  Pt currently with functional limitations due to the deficits listed below (see PT Problem List). Pt will benefit from skilled PT to increase their independence and safety with mobility to allow discharge to the venue listed below.          Recommendations for follow up therapy are one component of a multi-disciplinary discharge planning process, led by the attending physician.  Recommendations may be updated based on patient status, additional functional criteria and insurance authorization.  Follow Up Recommendations Acute inpatient rehab (3hours/day)    Assistance Recommended at Discharge Frequent or constant Supervision/Assistance  Patient can return home with the following  Two people to help with walking and/or transfers;Two people to help with  bathing/dressing/bathroom;Assistance with cooking/housework;Direct supervision/assist for medications management;Assist for transportation;Help with stairs or ramp for entrance;Direct supervision/assist for financial management    Equipment Recommendations Other (comment) (TBD)  Recommendations for Other Services  Rehab consult    Functional Status Assessment Patient has had a recent decline in their functional status and demonstrates the ability to make significant improvements in function in a reasonable and predictable amount of time.     Precautions / Restrictions Precautions Precautions: Fall Restrictions Weight Bearing Restrictions: No      Mobility  Bed Mobility Overal bed mobility: Needs Assistance Bed Mobility: Supine to Sit, Sit to Supine     Supine to sit: Max assist Sit to supine: Max assist, +2 for physical assistance   General bed mobility comments: pt able to move LLE off EOB. Max A for RLE off bed and elevation of trunk into sitting. Pt able to use LUE to push away from bed. Max A +2 for return to supine.    Transfers Overall transfer level: Needs assistance                 General transfer comment: unable to attempt today due to no active motion R side. Spoke with RN about using maxisky for bed to chair    Ambulation/Gait               General Gait Details: unable  Financial trader [employer] (Stroke Patients Only)       Balance Overall balance assessment: Needs assistance Sitting-balance support: Feet supported, Single extremity supported Sitting balance-Leahy Scale: Poor Sitting balance - Comments: posterior LOB with initial sitting, mod A  to prevent fall bkwds. Achieved min-guard with increased time EOB Postural control: Right lateral lean, Posterior lean                                   Pertinent Vitals/Pain Pain Assessment Pain Assessment: No/denies pain    Home Living  Family/patient expects to be discharged to:: Private residence Living Arrangements: Children Available Help at Discharge: Family;Available 24 hours/day Type of Home: Apartment Home Access: Stairs to enter   Entrance Stairs-Number of Steps: 3   Home Layout: One level Home Equipment: BSC/3in1;Hospital bed;Shower seat      Prior Function Prior Level of Function : Independent/Modified Independent;Driving               ADLs Comments: independent with ADLs, driving; 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 Reports has been using BSC in room at night. Reports difficulty wiping after toileting since breast sx.     Hand Dominance   Dominant Hand: Right    Extremity/Trunk Assessment   Upper Extremity Assessment Upper Extremity Assessment: Defer to OT evaluation;RUE deficits/detail RUE Deficits / Details: able to move thumb and perform small range elbow flexion, otherwise no noted active mvmt RUE    Lower Extremity Assessment Lower Extremity Assessment: RLE deficits/detail RLE Deficits / Details: 0/5 throughout with stiffness noted R ankle and knee. Pt reports she has been having cramping in R calf and adductors, not suring re eval though. RLE Sensation: decreased light touch;decreased proprioception RLE Coordination: decreased gross motor;decreased fine motor    Cervical / Trunk Assessment Cervical / Trunk Assessment: Normal  Communication   Communication: No difficulties  Cognition Arousal/Alertness: Awake/alert Behavior During Therapy: WFL for tasks assessed/performed Overall Cognitive Status: Impaired/Different from baseline Area of Impairment: Problem solving                             Problem Solving: Slow processing General Comments: slow of speech and command following        General Comments General comments (skin integrity, edema, etc.): worked on [employer] through R hand in sitting as well as wt shifting EOB. Spoke with RN about PRAFO  for R ankle due to tightness    Exercises General Exercises - Lower Extremity Ankle Circles/Pumps: PROM, Right, 10 reps Long Arc Quad: PROM, Right, 10 reps, Seated   Assessment/Plan    PT Assessment Patient needs continued PT services  PT Problem List Decreased strength;Decreased range of motion;Decreased activity tolerance;Decreased balance;Decreased coordination;Decreased mobility;Decreased cognition;Decreased knowledge of use of DME;Decreased safety awareness;Decreased knowledge of precautions;Impaired sensation;Impaired tone;Obesity       PT Treatment Interventions DME instruction;Gait training;Functional mobility training;Therapeutic activities;Therapeutic exercise;Balance training;Neuromuscular re-education;Cognitive remediation;Patient/family education;Wheelchair mobility training    PT Goals (Current goals can be found in the Care Plan section)  Acute Rehab PT Goals Patient Stated Goal: home PT Goal Formulation: With patient Time For Goal Achievement: 11/27/21 Potential to Achieve Goals: Good    Frequency Min 4X/week     Co-evaluation               AM-PAC PT "6 Clicks" Mobility  Outcome Measure Help needed turning from your back to your side while in a flat bed without using bedrails?: Total Help needed moving from lying on your back to sitting on the side of a flat bed without using bedrails?: Total Help needed moving to and from  a bed to a chair (including a wheelchair)?: Total Help needed standing up from a chair using your arms (e.g., wheelchair or bedside chair)?: Total Help needed to walk in hospital room?: Total Help needed climbing 3-5 steps with a railing? : Total 6 Click Score: 6    End of Session   Activity Tolerance: Patient tolerated treatment well Patient left: in bed;with call bell/phone within reach;with family/visitor present Nurse Communication: Mobility status PT Visit Diagnosis: Difficulty in walking, not elsewhere classified  (R26.2);Hemiplegia and hemiparesis Hemiplegia - Right/Left: Right Hemiplegia - dominant/non-dominant: Dominant Hemiplegia - caused by: Unspecified    Time: 5015-8682 PT Time Calculation (min) (ACUTE ONLY): 29 min   Charges:   PT Evaluation $PT Re-evaluation: 1 Re-eval PT Treatments $Therapeutic Activity: 8-22 mins        Leighton Roach, [place]  Pager [phone] Office [place] 11/13/2021, 4:54 PM

## 2021-11-13 NOTE — Progress Notes (Signed)
OT Cancellation Note  Patient Details Name: Sheryl Mcmillan MRN: [id number] DOB: [birth year]   Cancelled Treatment:    Reason Eval/Treat Not Completed: Other (comment) (Pt's BP 173/85 with headache. OT treatment will f/u tomorrow when vital signs are within parameters)  Mavery Milling A Dalten Ambrosino 11/13/2021, 4:45 PM

## 2021-11-13 NOTE — Progress Notes (Signed)
Orthopedic Tech Progress Note Patient Details:  Sheryl Mcmillan [birth year] [id number]  Ortho Devices Type of Ortho Device: Prafo boot/shoe Ortho Device/Splint Location: RLE Ortho Device/Splint Interventions: Ordered, Application, Adjustment   Post Interventions Patient Tolerated: Well Instructions Provided: [name] 11/13/2021, 7:20 PM

## 2021-11-14 ENCOUNTER — Telehealth: Payer: Self-pay

## 2021-11-14 ENCOUNTER — Encounter (HOSPITAL_COMMUNITY): Payer: Self-pay | Admitting: Family Medicine

## 2021-11-14 DIAGNOSIS — G9389 Other specified disorders of brain: Secondary | ICD-10-CM | POA: Diagnosis not present

## 2021-11-14 NOTE — Telephone Encounter (Signed)
Returned call received from patient's daughter, [name], in regards to her mother's recent hospital admission and surgery. Dr. Chryl Heck was informed of the situation today by RN and had not been made aware that patient was hospitalized by hospital staff. Dr. Chryl Heck will visit the patient today at [facility] for further follow-up.  Daughter appreciative of call and knows to expect visit from MD later today.

## 2021-11-14 NOTE — Consult Note (Addendum)
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  She presented to the emergency department with right-sided weakness.  Symptoms had been present for 1 week.  CT of the head in the emergency department showed a 2.0 x 2.1 cm parenchymal mass within the posteromedial left frontal lobe likely reflecting a secondary (or less likely primary) neoplasm.  She had moderate surrounding vasogenic edema.  This was followed with an MRI of the brain without contrast which showed a partially solid contrast-enhancing mass in the superior left hemisphere with large area of vasogenic edema most consistent with metastatic disease.  She was seen by neurosurgery and radiation oncology.  She underwent preop SRS to the brain lesion on 11/10/2021.  She also underwent a stereotactic left frontal craniotomy on 11/10/2021.  Surgical path pending.  CT chest/abdomen/pelvis was obtained this admission on 11/12/2021 which showed no signs of metastatic disease in the chest, abdomen, pelvis.  Patient was seen this afternoon in her hospital room.  No family at the bedside.  We were alerted to the patient's admission by her daughter who called our office earlier today.  The patient reports that she is slowly recovering.  She still has right upper and right lower extremity weakness.  Has very minimal movement in the right upper extremity and really no movement at all in the right lower extremity.  Plan is for her to go to CIR following hospital discharge.  She currently denies headaches, fevers, chest pain,  shortness of breath, abdominal pain, nausea, vomiting.  No bleeding reported.  Medical oncology was asked see the patient for recommendations regarding her breast cancer and new brain mass.  Past Medical History:  Diagnosis Date   Arthritis    Breast cancer ([place])    Family history of non-Hodgkin's lymphoma 06/01/2020   GERD (gastroesophageal reflux disease)    Hemorrhoids    Migraines    PCOS (polycystic ovarian syndrome)    PONV (postoperative nausea and vomiting)   :   Past Surgical History:  Procedure Laterality Date   APPLICATION OF CRANIAL NAVIGATION Left 11/10/2021   Procedure: APPLICATION OF CRANIAL NAVIGATION;  Surgeon: Consuella Lose, MD;  Location: [place];  Service: Neurosurgery;  Laterality: Left;   AXILLARY SENTINEL NODE BIOPSY Left 11/07/2020   Procedure: LEFT AXILLARY SENTINEL NODE BIOPSY;  Surgeon: [name], MD;  Location: [place];  Service: General;  Laterality: Left;   BREAST CYST EXCISION Right 09/22/2021   Procedure: Excision of right axillary excess soft tissue.;  Surgeon: [name], MD;  Location: [place];  Service: Plastics;  Laterality: Right;   BREAST RECONSTRUCTION WITH PLACEMENT OF TISSUE EXPANDER AND FLEX HD (ACELLULAR HYDRATED DERMIS) Bilateral 11/07/2020   Procedure: BILATERAL BREAST RECONSTRUCTION WITH PLACEMENT OF TISSUE EXPANDER AND FLEX HD (ACELLULAR HYDRATED DERMIS);  Surgeon: [name], MD;  Location: [place];  Service: Plastics;  Laterality: Bilateral;   COSMETIC SURGERY     CRANIOTOMY Left 11/10/2021   Procedure: LT FRONTAL CRANIOTOMY TUMOR EXCISION;  Surgeon: Consuella Lose, MD;  Location: [place];  Service: Neurosurgery;  Laterality: Left;   DILATION AND CURETTAGE OF UTERUS     HEMORRHOID SURGERY  IR IMAGING GUIDED PORT INSERTION  06/15/2020   LIPOSUCTION WITH LIPOFILLING Bilateral 09/22/2021   Procedure: Fat grafting bilateral breast;  Surgeon: [name], MD;  Location: [place];   Service: Plastics;  Laterality: Bilateral;   MASS EXCISION Bilateral 04/25/2021   Procedure: excision of bilateral axillary breast tissue;  Surgeon: [name], MD;  Location: [place];  Service: Plastics;  Laterality: Bilateral;   PORT-A-CATH REMOVAL Right 11/07/2020   Procedure: REMOVAL PORT-A-CATH;  Surgeon: [name], MD;  Location: [place];  Service: General;  Laterality: Right;   REMOVAL OF BILATERAL TISSUE EXPANDERS WITH PLACEMENT OF BILATERAL BREAST IMPLANTS Bilateral 04/25/2021   Procedure: REMOVAL OF BILATERAL TISSUE EXPANDERS WITH PLACEMENT OF BILATERAL BREAST IMPLANTS;  Surgeon: [name], MD;  Location: [place];  Service: Plastics;  Laterality: Bilateral;   ROBOTIC ASSISTED TOTAL HYSTERECTOMY WITH BILATERAL SALPINGO OOPHERECTOMY Bilateral 03/14/2021   Procedure: XI ROBOTIC ASSISTED TOTAL HYSTERECTOMY WITH BILATERAL SALPINGO OOPHORECTOMY;  Surgeon: Everitt Amber, MD;  Location: [facility];  Service: Gynecology;  Laterality: Bilateral;   TOTAL MASTECTOMY Bilateral 11/07/2020   Procedure: BILATERAL MASTECTOMY;  Surgeon: [name], MD;  Location: [place];  Service: General;  Laterality: Bilateral;  :   Current Facility-Administered Medications  Medication Dose Route Frequency Provider Last Rate Last Admin   acetaminophen (TYLENOL) tablet 650 mg  650 mg Oral Q6H PRN Consuella Lose, MD   650 mg at 11/09/21 1230   Or   acetaminophen (TYLENOL) suppository 650 mg  650 mg Rectal Q6H PRN Consuella Lose, MD       Chlorhexidine Gluconate Cloth 2 % PADS 6 each  6 each Topical q morning Danford, Suann Larry, MD   6 each at 11/14/21 1015   cloNIDine (CATAPRES) tablet 0.1 mg  0.1 mg Oral BID Ashok Pall, MD   0.1 mg at 11/14/21 1014   dexamethasone (DECADRON) tablet 4 mg  4 mg Oral Q6H Consuella Lose, MD   4 mg at 11/14/21 1139   hydrALAZINE (APRESOLINE) injection 10 mg  10 mg Intravenous Q4H PRN Consuella Lose, MD   10 mg at  11/13/21 2106   HYDROcodone-acetaminophen (NORCO/VICODIN) 5-325 MG per tablet 1 tablet  1 tablet Oral Q4H PRN Consuella Lose, MD   1 tablet at 11/14/21 1144   labetalol (NORMODYNE) injection 10-40 mg  10-40 mg Intravenous Q10 min PRN Consuella Lose, MD   40 mg at 11/13/21 2312   lactated ringers infusion   Intravenous Continuous Consuella Lose, MD       levETIRAcetam (KEPPRA) tablet 500 mg  500 mg Oral BID Consuella Lose, MD   500 mg at 11/14/21 1014   LORazepam (ATIVAN) injection 1 mg  1 mg Intravenous Q8H PRN Consuella Lose, MD       morphine (PF) 2 MG/ML injection 1-2 mg  1-2 mg Intravenous Q2H PRN Consuella Lose, MD   1 mg at 11/12/21 1453   ondansetron (ZOFRAN) tablet 4 mg  4 mg Oral Q6H PRN Consuella Lose, MD   4 mg at 11/10/21 0909   Or   ondansetron (ZOFRAN) injection 4 mg  4 mg Intravenous Q6H PRN Consuella Lose, MD   4 mg at 11/13/21 2311   oxyCODONE (Oxy IR/ROXICODONE) immediate release tablet 5 mg  5 mg Oral Q6H PRN Consuella Lose, MD   5 mg at 11/13/21 2309   pantoprazole (PROTONIX) EC tablet 40 mg  40 mg Oral QHS Donnamae Jude, RPH   40 mg at 11/13/21  2209   prochlorperazine (COMPAZINE) injection 10 mg  10 mg Intravenous Q6H PRN Consuella Lose, MD       promethazine (PHENERGAN) tablet 12.5-25 mg  12.5-25 mg Oral Q4H PRN Consuella Lose, MD       senna-docusate (Senokot-S) tablet 1 tablet  1 tablet Oral QHS PRN Consuella Lose, MD   1 tablet at 11/11/21 2107      Allergies  Allergen Reactions   Carboplatin Hives and Itching    Carboplatin reaction after dose #5 requiring Benadryl, Solumedrol, epinephrine, Claritin.   Other Hives    Baking Soda   Wound Dressing Adhesive Hives    Rips skin, prefers paper tape  :   Family History  Problem Relation Age of Onset   Hypertension Mother    Diabetes Mother    Cancer Mother 39       unknown primary   Other Mother        brain tumor; dx 1s   Non-Hodgkin's lymphoma Brother 24    Other Maternal Uncle 66       brain tumor   Breast cancer Other        MGM's niece; dx late 49s   Pancreatic cancer Neg Hx    Colon cancer Neg Hx    Endometrial cancer Neg Hx    Prostate cancer Neg Hx    Ovarian cancer Neg Hx   :   Social History   Socioeconomic History   Marital status: Single    Spouse name: Not on file   Number of children: 1   Years of education: Not on file   Highest education level: Some college, no degree  Occupational History   Not on file  Tobacco Use   Smoking status: Former    Packs/day: 0.50    Years: 10.00    Pack years: 5.00    Types: Cigarettes    Quit date: 08/23/2020    Years since quitting: 1.2   Smokeless tobacco: Never  Vaping Use   Vaping Use: Never used  Substance and Sexual Activity   Alcohol use: Not Currently    Comment: rare   Drug use: Yes    Types: Marijuana    Comment: Last use 11/07/21   Sexual activity: Yes    Birth control/protection: Surgical    Comment: Hysterectomy  Other Topics Concern   Not on file  Social History Narrative   Not on file   Social Determinants of Health   Financial Resource Strain: Not on file  Food Insecurity: Not on file  Transportation Needs: Not on file  Physical Activity: Not on file  Stress: Not on file  Social Connections: Not on file  Intimate Partner Violence: Not on file  :  Review of Systems: A comprehensive 14 point review of systems was negative except as noted in the HPI.  Exam: Patient Vitals for the past 24 hrs:  BP Temp Temp src Pulse Resp SpO2  11/14/21 1400 135/70 -- -- 65 (!) 25 (!) 82 %  11/14/21 1300 133/71 -- -- -- 12 --  11/14/21 1200 (!) 148/87 98.4 F (36.9 C) Oral 65 14 99 %  11/14/21 1100 (!) 217/115 -- -- 75 19 100 %  11/14/21 1000 (!) 148/78 -- -- 79 18 98 %  11/14/21 0900 (!) 151/79 -- -- 71 20 97 %  11/14/21 0800 129/71 98.1 F (36.7 C) Oral (!) 58 12 94 %  11/14/21 0700 115/66 -- -- 60 11 94 %  11/14/21 0600 138/79 -- -- [name]  58 13 97 %  11/14/21  0500 117/73 -- -- (!) 58 12 95 %  11/14/21 0400 126/78 97.9 F (36.6 C) Oral 65 12 95 %  11/14/21 0300 125/73 -- -- (!) 59 12 95 %  11/14/21 0200 106/75 -- -- 60 12 96 %  11/14/21 0100 128/71 -- -- 66 14 94 %  11/14/21 0000 134/76 97.8 F (36.6 C) Oral 70 14 96 %  11/13/21 2300 (!) 163/85 -- -- 73 14 97 %  11/13/21 2200 (!) 150/79 -- -- 70 15 95 %  11/13/21 2100 (!) 163/83 -- -- 68 14 95 %  11/13/21 2000 (!) 169/88 98.3 F (36.8 C) Oral 68 15 95 %  11/13/21 1900 (!) 175/79 -- -- 73 18 97 %  11/13/21 1830 (!) 162/84 -- -- 77 14 95 %  11/13/21 1800 (!) 179/90 -- -- -- 13 --  11/13/21 1700 (!) 161/89 -- -- -- 19 95 %    General: Resting in bed quietly, no distress. Eyes:  no scleral icterus.   ENT:  There were no oropharyngeal lesions.    Respiratory: lungs were clear bilaterally without wheezing or crackles.   Cardiovascular:  Regular rate and rhythm, S1/S2, without murmur, rub or gallop.    GI:  abdomen was soft, flat, nontender, nondistended, without organomegaly.   Musculoskeletal: The patient is able to lift her fingers on her right hand but really unable to fully lift her arm.  Unable to move her right leg or wiggle toes of the right foot.  Sensation is intact. Skin exam was without echymosis, petichae.   Neuro exam was nonfocal. Patient was alert and oriented.    Lab Results  Component Value Date   WBC 9.7 11/08/2021   HGB 13.6 11/08/2021   HCT 41.1 11/08/2021   PLT 329 11/08/2021   GLUCOSE 158 (H) 11/08/2021   ALT 47 (H) 11/07/2021   AST 31 11/07/2021   NA 140 11/08/2021   K 4.3 11/08/2021   CL 107 11/08/2021   CREATININE 0.73 11/08/2021   BUN 9 11/08/2021   CO2 20 (L) 11/08/2021    CT HEAD WO CONTRAST  Result Date: 11/07/2021 CLINICAL DATA:  Provided history: Neuro deficit, acute, stroke suspected; right arm and leg weakness. EXAM: CT HEAD WITHOUT CONTRAST TECHNIQUE: Contiguous axial images were obtained from the base of the skull through the vertex without  intravenous contrast. RADIATION DOSE REDUCTION: This exam was performed according to the departmental dose-optimization program which includes automated exposure control, adjustment of the mA and/or kV according to patient size and/or use of iterative reconstruction technique. COMPARISON:  Head CT 04/11/2011. FINDINGS: Brain: Cerebral volume is normal. 2.0 x 1.9 x 2.1 cm parenchymal mass within the posteromedial left frontal lobe. There is subtle hyperdensity associated with the mass, which may reflect minimal hemorrhage. Moderate surrounding vasogenic edema. Resultant mass effect with partial effacement of the left lateral ventricle, and trace rightward midline shift. Partially empty sella turcica. No demarcated cortical infarct. No extra-axial fluid collection. No midline shift. Vascular: No hyperdense vessel. Skull: No acute fracture or aggressive osseous lesion. Sinuses/Orbits: No mass or acute finding within the imaged orbits. Lobular partial opacification of the left sphenoid sinus, which may reflect the presence of a mucous retention cyst, polyp and/or secretions. Impression #1 called by telephone at the time of interpretation on 11/07/2021 at 12:55 pm to provider ADAM CURATOLO , who verbally acknowledged these results. IMPRESSION: 2.0 x 2.1 cm parenchymal mass within the posteromedial left frontal lobe, likely  reflecting a secondary (or less likely primary) neoplasm. Subtle hyperdensity within the mass, which may reflect minimal hemorrhage. Moderate surrounding vasogenic edema. Resultant mass effect with partial effacement of the left little ventricle and trace rightward midline shift. A brain MRI without and with contrast is recommended for further evaluation. Partially empty sella turcica. While this finding often reflects incidental anatomic variation, it can also be associated with idiopathic intracranial hypertension (pseudotumor cerebri). Left sphenoid sinus disease, as described. Electronically Signed    By: Kellie Simmering D.O.   On: 11/07/2021 12:57   MR BRAIN W WO CONTRAST  Result Date: 11/12/2021 CLINICAL DATA:  Brain tumor resection EXAM: MRI HEAD WITHOUT AND WITH CONTRAST TECHNIQUE: Multiplanar, multiecho pulse sequences of the brain and surrounding structures were obtained without and with intravenous contrast. CONTRAST:  10 mL Gadavist COMPARISON:  Brain MRI 11/08/2021 FINDINGS: Brain: Status post resection of left frontal paramedian mass. There is mildly decreased surrounding hyperintense T2-weighted signal. There is no residual nodular contrast enhancement. A small focus of enhancement along the medial resection margin appears to be vascular. Minimal blood products. Brain parenchymal signal is otherwise normal. Vascular: Major flow voids are preserved. Skull and upper cervical spine: Left craniotomy. Sinuses/Orbits:No paranasal sinus fluid levels or advanced mucosal thickening. No mastoid or middle ear effusion. Normal orbits. IMPRESSION: Status post resection of left frontal paramedian mass without residual nodular contrast enhancement. Slightly decreased surrounding hyperintense T2-weighted signal. Electronically Signed   By: Ulyses Jarred M.D.   On: 11/12/2021 00:57   MR BRAIN W WO CONTRAST  Result Date: 11/08/2021 CLINICAL DATA:  Brain metastasis, radiation planning EXAM: MRI HEAD WITHOUT AND WITH CONTRAST TECHNIQUE: Multiplanar, multiecho pulse sequences of the brain and surrounding structures were obtained without and with intravenous contrast. CONTRAST:  62m GADAVIST GADOBUTROL 1 MMOL/ML IV SOLN COMPARISON:  Brain MRI dated 1 day prior FINDINGS: Brain: The 2.2 cm x 1.9 cm enhancing solid and cystic lesion in the left frontal lobe near the vertex is unchanged. Vasogenic edema in the adjacent frontal and parietal lobes is also unchanged. There is partial effacement of the adjacent left lateral ventricle but no midline shift. SWI signal dropout within the lesion is consistent with intralesional  hemorrhage. No other enhancing lesions are seen. There is no acute intracranial hemorrhage, extra-axial fluid collection, or acute infarct. Background parenchymal volume is normal. The ventricles are otherwise normal in size. Parenchymal signal is otherwise normal. Vascular: Normal flow voids. Skull and upper cervical spine: Normal marrow signal. Sinuses/Orbits: There is near-complete opacification of the left sphenoid sinus, unchanged. The globes and orbits are unremarkable. Other: None. IMPRESSION: Unchanged enhancing metastatic lesion in the left frontal lobe with surrounding vasogenic edema. Electronically Signed   By: PValetta MoleM.D.   On: 11/08/2021 17:30   MR Brain W and Wo Contrast  Result Date: 11/07/2021 CLINICAL DATA:  Brain mass EXAM: MRI HEAD WITHOUT AND WITH CONTRAST TECHNIQUE: Multiplanar, multiecho pulse sequences of the brain and surrounding structures were obtained without and with intravenous contrast. CONTRAST:  159mGADAVIST GADOBUTROL 1 MMOL/ML IV SOLN COMPARISON:  None Available. FINDINGS: Brain: No acute infarct. There is a large area of vasogenic edema in the superior left hemisphere surrounding a partially solid contrast-enhancing mass that measures 2.2 x 1.9 cm (series 10, image 43). There are no other enhancing lesions. No acute or chronic hemorrhage. No midline shift. No hydrocephalus. The midline structures are normal. Vascular: Major flow voids are preserved. Skull and upper cervical spine: Normal calvarium and skull base. Visualized  upper cervical spine and soft tissues are normal. Sinuses/Orbits:No paranasal sinus fluid levels or advanced mucosal thickening. No mastoid or middle ear effusion. Normal orbits. IMPRESSION: Partially solid contrast-enhancing mass in the superior left hemisphere with large area of vasogenic edema, most consistent with metastatic disease. Electronically Signed   By: Ulyses Jarred M.D.   On: 11/07/2021 22:50   CT CHEST ABDOMEN PELVIS W  CONTRAST  Result Date: 11/12/2021 CLINICAL DATA:  History of breast carcinoma, brain metastasis EXAM: CT CHEST, ABDOMEN, AND PELVIS WITH CONTRAST TECHNIQUE: Multidetector CT imaging of the chest, abdomen and pelvis was performed following the standard protocol during bolus administration of intravenous contrast. RADIATION DOSE REDUCTION: This exam was performed according to the departmental dose-optimization program which includes automated exposure control, adjustment of the mA and/or kV according to patient size and/or use of iterative reconstruction technique. CONTRAST:  144m OMNIPAQUE IOHEXOL 300 MG/ML  SOLN COMPARISON:  Previous CT abdomen and pelvis done on 07/04/2021 FINDINGS: CT CHEST FINDINGS Cardiovascular: There is homogeneous enhancement in thoracic aorta. There are no filling defects in the central pulmonary artery branches. Mediastinum/Nodes: Subcentimeter nodes are seen in the mediastinum. Lungs/Pleura: There is no focal pulmonary consolidation. In image 41 of series 5, there is a 3 mm plaque-like lesion in the major fissure most likely pleural thickening. No discrete parenchymal nodules are seen in the lung fields. There is no pleural effusion or pneumothorax. Musculoskeletal: No focal lytic or sclerotic lesions are seen in the bony structures. There is presence of reconstruction prostheses in both breasts. CT ABDOMEN PELVIS FINDINGS Hepatobiliary: Liver measures 22 cm in length. There is fatty infiltration. No focal abnormality is seen. There is no dilation of bile ducts. Gallbladder is not seen. Pancreas: No focal abnormality is seen. Spleen: Unremarkable. Adrenals/Urinary Tract: Adrenals are not enlarged. There is no hydronephrosis. There are no renal or ureteral stones. Urinary bladder is not distended. Foley catheter is seen in the bladder. Air in the lumen of the bladder may be due to recent catheterization. Stomach/Bowel: Stomach is unremarkable. Small bowel loops are not dilated. Appendix  is not dilated. There is no wall thickening in colon. Vascular/Lymphatic: Vascular structures are unremarkable. There is no significant lymphadenopathy in the abdomen and pelvis. Reproductive: Uterus is not seen.  There are no adnexal masses. Other: There is no ascites or pneumoperitoneum. Umbilical hernia containing fat is seen. Small bilateral inguinal hernias containing fat are noted. Musculoskeletal: No focal lytic or sclerotic lesions are seen in bony structures. Degenerative changes are noted at L5-S1 level with disc space narrowing, bony spurs and minimal anterolisthesis. Spondylolysis is seen in the L5 vertebra. IMPRESSION: There are no signs of metastatic disease in the chest, abdomen and pelvis. There is no focal pulmonary consolidation. There is no pleural effusion. There is no evidence of intestinal obstruction or pneumoperitoneum. There is no hydronephrosis. Enlarged fatty liver. Other findings as described in the body of the report. Electronically Signed   By: PElmer PickerM.D.   On: 11/12/2021 12:13     CT HEAD WO CONTRAST  Result Date: 11/07/2021 CLINICAL DATA:  Provided history: Neuro deficit, acute, stroke suspected; right arm and leg weakness. EXAM: CT HEAD WITHOUT CONTRAST TECHNIQUE: Contiguous axial images were obtained from the base of the skull through the vertex without intravenous contrast. RADIATION DOSE REDUCTION: This exam was performed according to the departmental dose-optimization program which includes automated exposure control, adjustment of the mA and/or kV according to patient size and/or use of iterative reconstruction technique. COMPARISON:  Head  CT 04/11/2011. FINDINGS: Brain: Cerebral volume is normal. 2.0 x 1.9 x 2.1 cm parenchymal mass within the posteromedial left frontal lobe. There is subtle hyperdensity associated with the mass, which may reflect minimal hemorrhage. Moderate surrounding vasogenic edema. Resultant mass effect with partial effacement of the  left lateral ventricle, and trace rightward midline shift. Partially empty sella turcica. No demarcated cortical infarct. No extra-axial fluid collection. No midline shift. Vascular: No hyperdense vessel. Skull: No acute fracture or aggressive osseous lesion. Sinuses/Orbits: No mass or acute finding within the imaged orbits. Lobular partial opacification of the left sphenoid sinus, which may reflect the presence of a mucous retention cyst, polyp and/or secretions. Impression #1 called by telephone at the time of interpretation on 11/07/2021 at 12:55 pm to provider ADAM CURATOLO , who verbally acknowledged these results. IMPRESSION: 2.0 x 2.1 cm parenchymal mass within the posteromedial left frontal lobe, likely reflecting a secondary (or less likely primary) neoplasm. Subtle hyperdensity within the mass, which may reflect minimal hemorrhage. Moderate surrounding vasogenic edema. Resultant mass effect with partial effacement of the left little ventricle and trace rightward midline shift. A brain MRI without and with contrast is recommended for further evaluation. Partially empty sella turcica. While this finding often reflects incidental anatomic variation, it can also be associated with idiopathic intracranial hypertension (pseudotumor cerebri). Left sphenoid sinus disease, as described. Electronically Signed   By: Kellie Simmering D.O.   On: 11/07/2021 12:57   MR BRAIN W WO CONTRAST  Result Date: 11/12/2021 CLINICAL DATA:  Brain tumor resection EXAM: MRI HEAD WITHOUT AND WITH CONTRAST TECHNIQUE: Multiplanar, multiecho pulse sequences of the brain and surrounding structures were obtained without and with intravenous contrast. CONTRAST:  10 mL Gadavist COMPARISON:  Brain MRI 11/08/2021 FINDINGS: Brain: Status post resection of left frontal paramedian mass. There is mildly decreased surrounding hyperintense T2-weighted signal. There is no residual nodular contrast enhancement. A small focus of enhancement along the  medial resection margin appears to be vascular. Minimal blood products. Brain parenchymal signal is otherwise normal. Vascular: Major flow voids are preserved. Skull and upper cervical spine: Left craniotomy. Sinuses/Orbits:No paranasal sinus fluid levels or advanced mucosal thickening. No mastoid or middle ear effusion. Normal orbits. IMPRESSION: Status post resection of left frontal paramedian mass without residual nodular contrast enhancement. Slightly decreased surrounding hyperintense T2-weighted signal. Electronically Signed   By: Ulyses Jarred M.D.   On: 11/12/2021 00:57   MR BRAIN W WO CONTRAST  Result Date: 11/08/2021 CLINICAL DATA:  Brain metastasis, radiation planning EXAM: MRI HEAD WITHOUT AND WITH CONTRAST TECHNIQUE: Multiplanar, multiecho pulse sequences of the brain and surrounding structures were obtained without and with intravenous contrast. CONTRAST:  24m GADAVIST GADOBUTROL 1 MMOL/ML IV SOLN COMPARISON:  Brain MRI dated 1 day prior FINDINGS: Brain: The 2.2 cm x 1.9 cm enhancing solid and cystic lesion in the left frontal lobe near the vertex is unchanged. Vasogenic edema in the adjacent frontal and parietal lobes is also unchanged. There is partial effacement of the adjacent left lateral ventricle but no midline shift. SWI signal dropout within the lesion is consistent with intralesional hemorrhage. No other enhancing lesions are seen. There is no acute intracranial hemorrhage, extra-axial fluid collection, or acute infarct. Background parenchymal volume is normal. The ventricles are otherwise normal in size. Parenchymal signal is otherwise normal. Vascular: Normal flow voids. Skull and upper cervical spine: Normal marrow signal. Sinuses/Orbits: There is near-complete opacification of the left sphenoid sinus, unchanged. The globes and orbits are unremarkable. Other: None. IMPRESSION: Unchanged enhancing  metastatic lesion in the left frontal lobe with surrounding vasogenic edema.  Electronically Signed   By: Valetta Mole M.D.   On: 11/08/2021 17:30   MR Brain W and Wo Contrast  Result Date: 11/07/2021 CLINICAL DATA:  Brain mass EXAM: MRI HEAD WITHOUT AND WITH CONTRAST TECHNIQUE: Multiplanar, multiecho pulse sequences of the brain and surrounding structures were obtained without and with intravenous contrast. CONTRAST:  78m GADAVIST GADOBUTROL 1 MMOL/ML IV SOLN COMPARISON:  None Available. FINDINGS: Brain: No acute infarct. There is a large area of vasogenic edema in the superior left hemisphere surrounding a partially solid contrast-enhancing mass that measures 2.2 x 1.9 cm (series 10, image 43). There are no other enhancing lesions. No acute or chronic hemorrhage. No midline shift. No hydrocephalus. The midline structures are normal. Vascular: Major flow voids are preserved. Skull and upper cervical spine: Normal calvarium and skull base. Visualized upper cervical spine and soft tissues are normal. Sinuses/Orbits:No paranasal sinus fluid levels or advanced mucosal thickening. No mastoid or middle ear effusion. Normal orbits. IMPRESSION: Partially solid contrast-enhancing mass in the superior left hemisphere with large area of vasogenic edema, most consistent with metastatic disease. Electronically Signed   By: KUlyses JarredM.D.   On: 11/07/2021 22:50   CT CHEST ABDOMEN PELVIS W CONTRAST  Result Date: 11/12/2021 CLINICAL DATA:  History of breast carcinoma, brain metastasis EXAM: CT CHEST, ABDOMEN, AND PELVIS WITH CONTRAST TECHNIQUE: Multidetector CT imaging of the chest, abdomen and pelvis was performed following the standard protocol during bolus administration of intravenous contrast. RADIATION DOSE REDUCTION: This exam was performed according to the departmental dose-optimization program which includes automated exposure control, adjustment of the mA and/or kV according to patient size and/or use of iterative reconstruction technique. CONTRAST:  1023mOMNIPAQUE IOHEXOL 300 MG/ML   SOLN COMPARISON:  Previous CT abdomen and pelvis done on 07/04/2021 FINDINGS: CT CHEST FINDINGS Cardiovascular: There is homogeneous enhancement in thoracic aorta. There are no filling defects in the central pulmonary artery branches. Mediastinum/Nodes: Subcentimeter nodes are seen in the mediastinum. Lungs/Pleura: There is no focal pulmonary consolidation. In image 41 of series 5, there is a 3 mm plaque-like lesion in the major fissure most likely pleural thickening. No discrete parenchymal nodules are seen in the lung fields. There is no pleural effusion or pneumothorax. Musculoskeletal: No focal lytic or sclerotic lesions are seen in the bony structures. There is presence of reconstruction prostheses in both breasts. CT ABDOMEN PELVIS FINDINGS Hepatobiliary: Liver measures 22 cm in length. There is fatty infiltration. No focal abnormality is seen. There is no dilation of bile ducts. Gallbladder is not seen. Pancreas: No focal abnormality is seen. Spleen: Unremarkable. Adrenals/Urinary Tract: Adrenals are not enlarged. There is no hydronephrosis. There are no renal or ureteral stones. Urinary bladder is not distended. Foley catheter is seen in the bladder. Air in the lumen of the bladder may be due to recent catheterization. Stomach/Bowel: Stomach is unremarkable. Small bowel loops are not dilated. Appendix is not dilated. There is no wall thickening in colon. Vascular/Lymphatic: Vascular structures are unremarkable. There is no significant lymphadenopathy in the abdomen and pelvis. Reproductive: Uterus is not seen.  There are no adnexal masses. Other: There is no ascites or pneumoperitoneum. Umbilical hernia containing fat is seen. Small bilateral inguinal hernias containing fat are noted. Musculoskeletal: No focal lytic or sclerotic lesions are seen in bony structures. Degenerative changes are noted at L5-S1 level with disc space narrowing, bony spurs and minimal anterolisthesis. Spondylolysis is seen in the L5  vertebra. IMPRESSION: There are no signs of metastatic disease in the chest, abdomen and pelvis. There is no focal pulmonary consolidation. There is no pleural effusion. There is no evidence of intestinal obstruction or pneumoperitoneum. There is no hydronephrosis. Enlarged fatty liver. Other findings as described in the body of the report. Electronically Signed   By: Elmer Picker M.D.   On: 11/12/2021 12:13     46 y.o. BRCA positive [place] woman status post left breast upper outer quadrant biopsy 05/18/2020 for a clinical T2N0, stage IIA invasive ductal carcinoma, grade 3, estrogen receptor weakly positive, progesterone receptor negative, with an MIB-1 of 50%, and HER-2 positive by immunohistochemistry   (1) genetics testing 06/16/2020 shows a pathogenic mutation in BRCA1 at c.2475del (p.Asp825Glufs*21)             (a)  Variant of uncertain significance in MSH3 at c.2724A>G (Silent).             (b) no additional deleterious mutations through the Multi-Cancer Panel offered by [facility] including AIP, ALK, APC, ATM, AXIN2,BAP1,  BARD1, BLM, BMPR1A, BRCA1, BRCA2, BRIP1, CASR, CDC73, CDH1, CDK4, CDKN1B, CDKN1C, CDKN2A (p14ARF), CDKN2A (p16INK4a), CEBPA, CHEK2, CTNNA1, DICER1, DIS3L2, EGFR (c.2369C>T, p.Thr790Met variant only), EPCAM (Deletion/duplication testing only), FH, FLCN, GATA2, GPC3, GREM1 (Promoter region deletion/duplication testing only), HOXB13 (c.251G>A, p.Gly84Glu), HRAS, KIT, MAX, MEN1, MET, MITF (c.952G>A, p.Glu318Lys variant only), MLH1, MSH2, MSH3, MSH6, MUTYH, NBN, NF1, NF2, NTHL1, PALB2, PDGFRA, PHOX2B, PMS2, POLD1, POLE, POT1, PRKAR1A, PTCH1, PTEN, RAD50, RAD51C, RAD51D, RB1, RECQL4, RET, RNF43, RUNX1, SDHAF2, SDHA (sequence changes only), SDHB, SDHC, SDHD, SMAD4, SMARCA4, SMARCB1, SMARCE1, STK11, SUFU, TERC, TERT, TMEM127, TP53, TSC1, TSC2, VHL, WRN and WT1.    (2) neoadjuvant chemotherapy to consist of carboplatin and docetaxel together with trastuzumab and Pertuzumab every 3  weeks x 6, starting 06/16/2020, completed 09/28/2020                  (a) pertuzumab omitted after cycle 1     (3) trastuzumab to be continued to complete 1 year (through December 2022)             (a) echo 06/09/2020 shows EF in the 60-65% range             (b) echo 09/14/2020 shows an ejection fraction in 65-70% range.             (c) echo 01/18/2021 showed an ejection fraction in the 60-65 % range             (d) echo 04/03/2021 showed an ejection fraction in the 55 to 60% range.   (4) status post bilateral mastectomies 11/07/2020 showing             (A) on the right, no evidence of malignancy             (B) on the left, residual ypT1c ypN0 invasive ductal carcinoma, grade 3, with negative margins, now triple negative, with an MIB-1 of 15%.   I am not entirely sure why she did not receive adjuvant Kadcyla despite residual disease.  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   (5) bilateral salpingo-oophorectomy and hysterectomy with incidental cholecystectomy 03/14/2021 showed benign pathology  (6) left frontal brain mass diagnosed 11/07/2021-status post preop SRS and stereotactic left frontal craniotomy.  Pathology report pending.   PLAN:   This is a pleasant 46 year old female who completed 1  year of adjuvant trastuzumab.  She did not want to take olaparib which was recommended given her history of BRCA1 breast cancer.  ER was weakly positive and tamoxifen was also discussed.  The patient picked up this medication but never started it.  She presented to the emergency department right-sided weakness and was found to have a brain mass.  She underwent preop SRS and then surgical resection.  Surgical pathology report is pending.  However, brain mass most likely represents metastatic breast cancer.  Will await final pathology report before making any additional treatment  recommendations.  She continues to have significant right-sided weakness and is planning to go to CIR following hospital discharge.  Sheryl Bussing, NP   Attending Note  I personally saw the patient, reviewed the chart and examined the patient. The plan of care was discussed with the patient and the admitting team. I agree with the assessment and plan as documented above. Thank you very much for the consultation. Saw the patient, agree with assessment and plan as mentioned above She doesn't appear to have metastatic disease outside the brain. She will have to follow up with neurosurgery/rad onc for management of steroid taper There is no role for systemic treatment since she appears to have had complete resection and preop SRS. We will consider peirodic systemic imaging and will discuss once again about taking olaparib/tamoxifen outpatient. She can see [place] back in about 4 weeks.

## 2021-11-14 NOTE — Progress Notes (Signed)
Physical Therapy Treatment Patient Details Name: Sheryl Mcmillan MRN: [id number] DOB: March 25, 1976 Today's Date: 11/14/2021   History of Present Illness Pt is a 46 y.o. female who presented to the ED 5/16 with R side weakness.  MRI revealed partially solid contrast-enhancing mass in the superior left hemisphere with large area of vasogenic edema, most consistent with  metastatic disease. Now s/p L frontal crani tumor excision 5/19. PMH: left breast cancer (ER negative, PR positive, BRCA1 positive) s/p neoadjuvant chemotherapy, bilateral mastectomy, hysterectomy and bilateral SOO    PT Comments    Pt remains with no active movement in RLE but able to make slow, steady progress with mobility. Tone in rt foot and ankle causing inversion/plantarflexion in be and likely the tone will push he out of the [facility]. Continue to recommend AIR for further rehab.    Recommendations for follow up therapy are one component of a multi-disciplinary discharge planning process, led by the attending physician.  Recommendations may be updated based on patient status, additional functional criteria and insurance authorization.  Follow Up Recommendations  Acute inpatient rehab (3hours/day)     Assistance Recommended at Discharge Frequent or constant Supervision/Assistance  Patient can return home with the following Two people to help with walking and/or transfers;Two people to help with bathing/dressing/bathroom;Assistance with cooking/housework;Direct supervision/assist for medications management;Assist for transportation;Help with stairs or ramp for entrance;Direct supervision/assist for financial management   Equipment Recommendations  Wheelchair (measurements PT);Wheelchair cushion (measurements PT)    Recommendations for Other Services       Precautions / Restrictions Precautions Precautions: Fall Restrictions Weight Bearing Restrictions: No     Mobility  Bed Mobility Overal bed mobility: Needs  Assistance Bed Mobility: Rolling, Supine to Sit Rolling: Min assist (toward rt)   Supine to sit: Mod assist, +2 for physical assistance, +2 for safety/equipment     General bed mobility comments: Assist to bring RLE off of bed and elevate trunk into sitting and to bring hips to EOB    Transfers Overall transfer level: Needs assistance Equipment used: Ambulation equipment used, 2 person hand held assist Transfers: Sit to/from Stand, Bed to chair/wheelchair/BSC Sit to Stand: Mod assist, +2 physical assistance, +2 safety/equipment           General transfer comment: Initially stood holding back of chair in front with assist to bring hips up as well as assist to stabilize rt foot and ankle and block knee. Stood on 2nd attempt with Stedy and used Stedy for bed to chair. Transfer via Lift Equipment: Stedy  Ambulation/Gait                   Stairs             Wheelchair Mobility    Modified Rankin (Stroke Patients Only)       Balance Overall balance assessment: Needs assistance Sitting-balance support: Feet supported Sitting balance-Leahy Scale: Fair Sitting balance - Comments: some R LOB noted   Standing balance support: Bilateral upper extremity supported Standing balance-Leahy Scale: Poor Standing balance comment: Stood for 45 sec holding chair and +2 mod assist. Stood x 2 with stedy as well with +2 mod assist.                            Cognition Arousal/Alertness: Awake/alert Behavior During Therapy: WFL for tasks assessed/performed Overall Cognitive Status: Impaired/Different from baseline Area of Impairment: Attention, Following commands, Awareness, Problem solving, Safety/judgement  Current Attention Level: Selective   Following Commands: Follows one step commands consistently Safety/Judgement: Decreased awareness of deficits Awareness: Emergent Problem Solving: Slow processing General Comments: slow of  speech and command following        Exercises Other Exercises Other Exercises: Worked on shifting weight side to side in sitting EOB    General Comments General comments (skin integrity, edema, etc.): VSS on RA      Pertinent Vitals/Pain Pain Assessment Pain Assessment: Faces Faces Pain Scale: No hurt    Home Living Family/patient expects to be discharged to:: Private residence Living Arrangements: Children Available Help at Discharge: Family;Available 24 hours/day Type of Home: Apartment Home Access: Stairs to enter   Entrance Stairs-Number of Steps: 3   Home Layout: One level Home Equipment: BSC/3in1;Hospital bed;Shower seat      Prior Function            PT Goals (current goals can now be found in the care plan section) Progress towards PT goals: Progressing toward goals    Frequency    Min 4X/week      PT Plan Current plan remains appropriate    Co-evaluation PT/OT/SLP Co-Evaluation/Treatment: Yes Reason for Co-Treatment: Complexity of the patient's impairments (multi-system involvement);For patient/therapist safety PT goals addressed during session: Mobility/safety with mobility;Balance OT goals addressed during session: ADL's and self-care      AM-PAC PT "6 Clicks" Mobility   Outcome Measure  Help needed turning from your back to your side while in a flat bed without using bedrails?: A Lot Help needed moving from lying on your back to sitting on the side of a flat bed without using bedrails?: Total Help needed moving to and from a bed to a chair (including a wheelchair)?: Total Help needed standing up from a chair using your arms (e.g., wheelchair or bedside chair)?: Total Help needed to walk in hospital room?: Total Help needed climbing 3-5 steps with a railing? : Total 6 Click Score: 7    End of Session Equipment Utilized During Treatment: Gait belt Activity Tolerance: Patient tolerated treatment well Patient left: in chair;with call  bell/phone within reach;with family/visitor present Nurse Communication: Mobility status;Need for lift equipment PT Visit Diagnosis: Difficulty in walking, not elsewhere classified (R26.2);Hemiplegia and hemiparesis Hemiplegia - Right/Left: Right Hemiplegia - dominant/non-dominant: Dominant Hemiplegia - caused by: Unspecified     Time: 2423-5361 PT Time Calculation (min) (ACUTE ONLY): 37 min  Charges:  $Therapeutic Activity: 8-22 mins                     [place] 11/14/2021, 2:08 PM

## 2021-11-14 NOTE — Progress Notes (Signed)
Pt transferred to 4N11.  

## 2021-11-14 NOTE — Progress Notes (Signed)
  NEUROSURGERY PROGRESS NOTE   No issues overnight. Pt with minimal complaints. Sitting in bedside chair today, reports subtle improvement primarily in RUE strength.  EXAM:  BP (!) 148/87   Pulse 65   Temp 98.4 F (36.9 C) (Oral)   Resp 14   Ht '5\' 11"'$  (1.803 m)   Wt (!) 143 kg   LMP 09/29/2020 (Approximate)   SpO2 99%   BMI 43.97 kg/m   Awake, alert, oriented  Speech fluent, appropriate  CN grossly intact  5/5 LUE/LLE 2-3/5 right finger flex/ext and grip, minimal proximal movement ? Flicker right hip flexion, no distal movement  IMPRESSION:  46 y.o. female POD# 4 s/p resection of left posterior frontal lesion with slowly improving dense SMA syndrome.  PLAN: - Cont PT/OT - Plan on CIR after d/c - Transfer to stepdown today   Consuella Lose, MD [facility]

## 2021-11-14 NOTE — Evaluation (Signed)
Occupational Therapy Re-Evaluation Patient Details Name: Sheryl Mcmillan MRN: [id number] DOB: Oct 15, 1975 Today's Date: 11/14/2021   History of Present Illness Pt is a 46 y.o. female who presented to the ED 5/16 with R side weakness.  MRI revealed partially solid contrast-enhancing mass in the superior left hemisphere with large area of vasogenic edema, most consistent with  metastatic disease. Now s/p L frontal crani tumor excision 5/19. PMH: left breast cancer (ER negative, PR positive, BRCA1 positive) s/p neoadjuvant chemotherapy, bilateral mastectomy, hysterectomy and bilateral SOO   Clinical Impression   Sheryl Mcmillan was re-evaluated s/p the above surgery. Upon re-evaluation pt is not limited by RLE and RUE weakness, impaired balance, poor activity tolerance, generalized weakness and impaired cognition. She required min A for rolling toward the R to complete rear hygiene at bed level and mod A +2 to sup>sit at EOB. Initially pt had severe R lateral lean/LOB, however she was able to correct and maintain sitting with min G. She sit<>stand 2x with mod A +2 and was a total A transfer with use of the steady to get to the chair. Slow slurred speech and increased time for processing noted throughout. Recommend d/c to AIR due to indep baseline and vast change since inital admission.      Recommendations for follow up therapy are one component of a multi-disciplinary discharge planning process, led by the attending physician.  Recommendations may be updated based on patient status, additional functional criteria and insurance authorization.   Follow Up Recommendations  Acute inpatient rehab (3hours/day)    Assistance Recommended at Discharge Frequent or constant Supervision/Assistance  Patient can return home with the following Two people to help with walking and/or transfers;Assistance with cooking/housework;Two people to help with bathing/dressing/bathroom;Assist for transportation;Help with stairs or ramp  for entrance    Functional Status Assessment  Patient has had a recent decline in their functional status and demonstrates the ability to make significant improvements in function in a reasonable and predictable amount of time.  Equipment Recommendations  Other (comment) (pending progression)    Recommendations for Other Services Rehab consult     Precautions / Restrictions Precautions Precautions: Fall Restrictions Weight Bearing Restrictions: No      Mobility Bed Mobility Overal bed mobility: Needs Assistance Bed Mobility: Rolling, Supine to Sit Rolling: Min assist   Supine to sit: Mod assist, +2 for physical assistance, +2 for safety/equipment          Transfers Overall transfer level: Needs assistance Equipment used: Ambulation equipment used, 2 person hand held assist Transfers: Sit to/from Stand, Bed to chair/wheelchair/BSC Sit to Stand: Mod assist, +2 physical assistance, +2 safety/equipment             Transfer via Lift Equipment: Stedy    Balance Overall balance assessment: Needs assistance Sitting-balance support: Feet supported Sitting balance-Leahy Scale: Fair Sitting balance - Comments: some R LOB noted   Standing balance support: Bilateral upper extremity supported Standing balance-Leahy Scale: Poor                             ADL either performed or assessed with clinical judgement   ADL Overall ADL's : Needs assistance/impaired Eating/Feeding: Minimal assistance;Sitting   Grooming: Minimal assistance;Sitting   Upper Body Bathing: Moderate assistance;Sitting   Lower Body Bathing: Total assistance;+2 for physical assistance;+2 for safety/equipment;Sit to/from stand   Upper Body Dressing : Moderate assistance;Sitting   Lower Body Dressing: Maximal assistance;+2 for physical assistance;+2 for safety/equipment;Sit to/from stand  Toilet Transfer: Maximal assistance;+2 for physical assistance;+2 for  safety/equipment;Stand-pivot   Toileting- Clothing Manipulation and Hygiene: Maximal assistance;+2 for physical assistance;+2 for safety/equipment;Sit to/from stand       Functional mobility during ADLs: Maximal assistance;+2 for physical assistance;+2 for safety/equipment General ADL Comments: limited by RLE and RUE weakness, impaired balance poor activity tolerance     Vision Baseline Vision/History: 0 No visual deficits Ability to See in Adequate Light: 0 Adequate Vision Assessment?: No apparent visual deficits     Perception     Praxis      Pertinent Vitals/Pain Pain Assessment Pain Assessment: Faces Faces Pain Scale: No hurt Pain Intervention(s): Monitored during session     Hand Dominance Right   Extremity/Trunk Assessment Upper Extremity Assessment Upper Extremity Assessment: RUE deficits/detail RUE Deficits / Details: 3/5 grip strength, some activity movemenr of wrist and elbow. No shoulder AROM RUE Sensation: WNL RUE Coordination: decreased fine motor;decreased gross motor   Lower Extremity Assessment Lower Extremity Assessment: Defer to PT evaluation   Cervical / Trunk Assessment Cervical / Trunk Assessment: Other exceptions (increased body habitus)   Communication Communication Communication: No difficulties   Cognition Arousal/Alertness: Awake/alert Behavior During Therapy: WFL for tasks assessed/performed Overall Cognitive Status: Impaired/Different from baseline Area of Impairment: Attention, Following commands, Awareness, Problem solving, Safety/judgement                   Current Attention Level: Selective   Following Commands: Follows one step commands consistently Safety/Judgement: Decreased awareness of deficits Awareness: Emergent Problem Solving: Slow processing General Comments: slow of speech and command following     General Comments  VSS onRA, daughter present and supportive    Exercises     Shoulder Instructions       Home Living Family/patient expects to be discharged to:: Private residence Living Arrangements: Children Available Help at Discharge: Family;Available 24 hours/day Type of Home: Apartment Home Access: Stairs to enter Entrance Stairs-Number of Steps: 3   Home Layout: One level     Bathroom Shower/Tub: Teacher, early years/pre: Standard     Home Equipment: BSC/3in1;Hospital bed;Shower seat          Prior Functioning/Environment Prior Level of Function : Independent/Modified Independent;Driving               ADLs Comments: independent with ADLs, driving; 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 Reports has been using BSC in room at night. Reports difficulty wiping after toileting since breast sx.        OT Problem List: Decreased strength;Decreased activity tolerance;Impaired balance (sitting and/or standing);Decreased coordination;Decreased knowledge of use of DME or AE;Decreased knowledge of precautions;Obesity;Impaired UE functional use      OT Treatment/Interventions: Self-care/ADL training;Therapeutic exercise;DME and/or AE instruction;Therapeutic activities;Balance training;Patient/family education;Energy conservation;Neuromuscular education    OT Goals(Current goals can be found in the care plan section) Acute Rehab OT Goals Patient Stated Goal: move better OT Goal Formulation: With patient Time For Goal Achievement: 11/22/21 Potential to Achieve Goals: Good  OT Frequency: Min 2X/week    Co-evaluation PT/OT/SLP Co-Evaluation/Treatment: Yes Reason for Co-Treatment: Complexity of the patient's impairments (multi-system involvement);For patient/therapist safety;To address functional/ADL transfers   OT goals addressed during session: ADL's and self-care      AM-PAC OT "6 Clicks" Daily Activity     Outcome Measure Help from another person eating meals?: A Little Help from another person taking care of personal grooming?: A Lot Help from  another person toileting, which includes using toliet,  bedpan, or urinal?: A Lot Help from another person bathing (including washing, rinsing, drying)?: A Lot Help from another person to put on and taking off regular upper body clothing?: A Lot Help from another person to put on and taking off regular lower body clothing?: A Lot 6 Click Score: 13   End of Session Equipment Utilized During Treatment: Gait belt Nurse Communication: Mobility status  Activity Tolerance: Patient tolerated treatment well Patient left: in chair;with call bell/phone within reach;with chair alarm set  OT Visit Diagnosis: Other abnormalities of gait and mobility (R26.89);Muscle weakness (generalized) (M62.81)                Time: 0100-7121 OT Time Calculation (min): 40 min Charges:  OT General Charges $OT Visit: 1 Visit OT Evaluation $OT Eval Moderate Complexity: 1 Mod   Amarise Lillo A Dwaine Pringle 11/14/2021, 1:30 PM

## 2021-11-14 NOTE — Progress Notes (Signed)
Inpatient Rehab Admissions Coordinator Note:   Per PT updated recommendations patient was screened for CIR candidacy by Michel Santee, PT. At this time, pt appears to be a potential candidate for CIR. I will place an order for rehab consult for full assessment, per our protocol.  Please contact me any with questions.Shann Medal, PT, DPT [phone] 11/14/21 9:43 AM

## 2021-11-15 LAB — SURGICAL PATHOLOGY

## 2021-11-15 MED ORDER — BISACODYL 10 MG RE SUPP: 10.0000 mg | Freq: Every day | RECTAL | Status: DC | PRN

## 2021-11-15 MED ORDER — POLYETHYLENE GLYCOL 3350 17 G PO PACK
17.0000 g | PACK | Freq: Every day | ORAL | Status: DC
Start: 2021-11-15 — End: 2021-11-17
  Administered 2021-11-15 – 2021-11-17 (×3): 17 g via ORAL
  Filled 2021-11-15 (×3): qty 1

## 2021-11-15 NOTE — Progress Notes (Signed)
Inpatient Rehabilitation Admissions Coordinator   I met with patient and her daughter at bedside. We discussed goals and expectations of a possible Cir admit. They are in agreement. Daughter and friends can provide 24/7 physical assist after Cir admit. I will begin insurance Auth once I have today's updated therapy notes.   Danne Baxter, RN, MSN Rehab Admissions Coordinator [phone] 11/15/2021 10:32 AM

## 2021-11-15 NOTE — Progress Notes (Signed)
Physical Therapy Treatment Patient Details Name: Sheryl Mcmillan MRN: [id number] DOB: 1975-12-02 Today's Date: 11/15/2021   History of Present Illness Pt is a 46 y.o. female who presented to the ED 5/16 with R side weakness.  MRI revealed partially solid contrast-enhancing mass in the superior left hemisphere with large area of vasogenic edema, most consistent with  metastatic disease. Now s/p L frontal crani tumor excision 5/19. PMH: left breast cancer (ER negative, PR positive, BRCA1 positive) s/p neoadjuvant chemotherapy, bilateral mastectomy, hysterectomy and bilateral SOO    PT Comments    Pt continues to make good progress as balance and mobility are improving. Expect pt will excel in using a scoot or squat pivot transfer. Pt remains highly motivated to maximize independence and feel she is an excellent acute inpatient rehab candidate. Feel with acute inpatient rehab that she will reach a level that will allow her family to assist her at home.   Recommendations for follow up therapy are one component of a multi-disciplinary discharge planning process, led by the attending physician.  Recommendations may be updated based on patient status, additional functional criteria and insurance authorization.  Follow Up Recommendations  Acute inpatient rehab (3hours/day)     Assistance Recommended at Discharge Frequent or constant Supervision/Assistance  Patient can return home with the following Two people to help with walking and/or transfers;Two people to help with bathing/dressing/bathroom;Assistance with cooking/housework;Direct supervision/assist for medications management;Assist for transportation;Help with stairs or ramp for entrance;Direct supervision/assist for financial management   Equipment Recommendations  Wheelchair (measurements PT);Wheelchair cushion (measurements PT)    Recommendations for Other Services       Precautions / Restrictions Precautions Precautions:  Fall Restrictions Weight Bearing Restrictions: No     Mobility  Bed Mobility Overal bed mobility: Needs Assistance Bed Mobility: Rolling, Supine to Sit Rolling: Min guard (toward rt)   Supine to sit: Mod assist, +2 for physical assistance, +2 for safety/equipment     General bed mobility comments: Assist to bring RLE off of bed and elevate trunk into sitting.    Transfers Overall transfer level: Needs assistance Equipment used: Ambulation equipment used, 2 person hand held assist Transfers: Sit to/from Stand, Bed to chair/wheelchair/BSC Sit to Stand: Mod assist, +2 physical assistance, +2 safety/equipment           General transfer comment: Assist to bring hips up and for balance. Rt knee blocked by Charlaine Dalton pad. Pt's rt foot maintained good position with sitting and standing with Stedy. Stood x 3 with Geophysical data processor: Stedy  Ambulation/Gait             Pre-gait activities: Stood in [place] x 3 for 1-2 minutes and worked to try to activate hip/knee extension with manual facilitation.     Stairs             Wheelchair Mobility    Modified Rankin (Stroke Patients Only)       Balance Overall balance assessment: Needs assistance Sitting-balance support: Feet supported Sitting balance-Leahy Scale: Fair Sitting balance - Comments: Prefers to have UE support but not required   Standing balance support: Bilateral upper extremity supported Standing balance-Leahy Scale: Poor Standing balance comment: Stood x 3 with Stedy for 1-2 minutes with min assist to maintain                            Cognition Arousal/Alertness: Awake/alert Behavior During Therapy: WFL for tasks assessed/performed Overall Cognitive Status: Impaired/Different from baseline  Area of Impairment: Following commands, Awareness, Problem solving, Safety/judgement                       Following Commands: Follows one step commands  consistently Safety/Judgement: Decreased awareness of deficits Awareness: Emergent Problem Solving: Slow processing          Exercises      General Comments        Pertinent Vitals/Pain Pain Assessment Pain Assessment: No/denies pain    Home Living                          Prior Function            PT Goals (current goals can now be found in the care plan section) Progress towards PT goals: Progressing toward goals    Frequency    Min 4X/week      PT Plan Current plan remains appropriate    Co-evaluation              AM-PAC PT "6 Clicks" Mobility   Outcome Measure  Help needed turning from your back to your side while in a flat bed without using bedrails?: A Lot Help needed moving from lying on your back to sitting on the side of a flat bed without using bedrails?: Total Help needed moving to and from a bed to a chair (including a wheelchair)?: Total Help needed standing up from a chair using your arms (e.g., wheelchair or bedside chair)?: Total Help needed to walk in hospital room?: Total Help needed climbing 3-5 steps with a railing? : Total 6 Click Score: 7    End of Session Equipment Utilized During Treatment: Gait belt Activity Tolerance: Patient tolerated treatment well Patient left: in chair;with call bell/phone within reach;with family/visitor present;with chair alarm set Nurse Communication: Mobility status;Need for lift equipment PT Visit Diagnosis: Difficulty in walking, not elsewhere classified (R26.2);Hemiplegia and hemiparesis Hemiplegia - Right/Left: Right Hemiplegia - dominant/non-dominant: Dominant Hemiplegia - caused by: Unspecified     Time: 0930-1005 PT Time Calculation (min) (ACUTE ONLY): 35 min  Charges:  $Therapeutic Activity: 23-37 mins                     [place] [facility] 11/15/2021, 10:58 AM

## 2021-11-15 NOTE — Op Note (Signed)
Name: SASHAY FELLING    MRN: [id number]   Date: 11/10/2021    DOB: 18-Nov-1975   STEREOTACTIC RADIOSURGERY OPERATIVE NOTE  PRE-OPERATIVE DIAGNOSIS:  Metastatic breast CA  POST-OPERATIVE DIAGNOSIS:  Same  PROCEDURE:  Stereotactic Radiosurgery  SURGEON:  Consuella Lose, MD  RADIATION ONCOLOGIST: Dr. Kyung Rudd, MD  TECHNIQUE:  The patient underwent a radiation treatment planning session in the radiation oncology simulation suite under the care of the radiation oncology physician and physicist.  I participated closely in the radiation treatment planning afterwards. The patient underwent planning CT which was fused to 3T high resolution MRI with 1 mm axial slices.  These images were fused on the planning system.  We contoured the gross target volumes and subsequently expanded this to yield the Planning Target Volume. I actively participated in the planning process.  I helped to define and review the target contours and also the contours of the optic pathway, eyes, brainstem and selected nearby organs at risk.  All the dose constraints for critical structures were reviewed and compared to AAPM Task Group 101.  The prescription dose conformity was reviewed.  I approved the plan electronically.    Accordingly, [name]  was brought to the TrueBeam stereotactic radiation treatment linac and placed in the custom immobilization mask.  The patient was aligned according to the IR fiducial markers with Brain[employer] Exactrac, then orthogonal x-rays were used in ExacTrac with the 6DOF robotic table and the shifts were made to align the patient  [name] received stereotactic radiosurgery to a prescription dose of 18Gy to posterior left frontal lesion uneventfully.    The detailed description of the procedure is recorded in the radiation oncology procedure note.  I was present for the duration of the procedure.  DISPOSITION:   Following delivery, the patient was transported to nursing in stable  condition and then transferred back to [facility] for surgical treatment.  Consuella Lose, MD [facility]

## 2021-11-15 NOTE — PMR Pre-admission (Signed)
PMR Admission Coordinator Pre-Admission Assessment   Patient: Sheryl Mcmillan is an 46 y.o., female MRN: [id number] DOB: 05/25/1976 Height: 5' 11" (180.3 cm) Weight: (!) 143 kg   Insurance Information HMO:     PPO:      PCP:      IPA:      80/20:      OTHER:  PRIMARY: [place] medicaid [employer]      Policy#: [id number]      Subscriber: pt CM Name:       Phone#: [phone]     Fax#:  [phone] Pre-Cert#: [id number] approved for 8 days and f/u with [employer] UM dept       Employer:  Benefits:  Phone #: [phone]     Name: 5/24 Eff. Date: 06/25/2020     Deduct: none      Out of Pocket Max: none      Life Max: none CIR: Per medicaid guidelines      SNF: per medicaid  Outpatient: per medicaid     Co-Pay:  Home Health: per medicaid      Co-Pay:  DME: per medicaid     Co-Pay:  Providers: in network  SECONDARY: none         Financial Counselor:       Phone#:    The "Data Collection Information Summary" for patients in Inpatient Rehabilitation Facilities with attached "Privacy Act Statement-Health Care Records" was provided and verbally reviewed with: N/A   Emergency Contact Information Contact Information       Name Relation Home Work Mobile    Granato,Jazmine Daughter     [phone]    Bryant,Tammy Aunt     [phone]    Rogers,Marquita       [phone]         Current Medical History  Patient Admitting Diagnosis: Brain mass    Sheryl Mcmillan is a right-handed 46-year-old female with a 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  She primarily complained of poor dexterity of her right hand.  Her past medical history is significant for triple negative breast cancer status postchemotherapy, surgery and immunotherapy.  Imaging revealed a solitary left frontal metastasis which appeared to be anterior to the precentral gyrus within the  supplementary motor area.  There was also significant associated edema.  Neurosurgery, Dr. Nundkumar was consulted.  She was taken to the operating room on 5/19 and underwent stereotactic left frontal craniotomy for resection of the tumor.  Radiation oncology was also consulted for preoperative adjuvant stereotactic radiosurgery.  Surgical pathology was consistent with metastatic poorly differentiated adenocarcinoma breast primary.  Postoperatively, she exhibited right plegia greatest in the right lower extremity. Dr. Iruku, her medical oncologist was also consulted and saw her on 5/23.  CT scans revealed no signs of metastatic disease in the chest, abdomen or pelvis. 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   Patient's medical record from  hospital has been reviewed by the rehabilitation admission coordinator and physician.   Past Medical History      Past Medical History:  Diagnosis Date   Arthritis     Breast cancer (HCC)     Family history of non-Hodgkin's lymphoma 06/01/2020   GERD (gastroesophageal reflux disease)     Hemorrhoids     Migraines       PCOS (polycystic ovarian syndrome)     PONV (postoperative nausea and vomiting)      Has the patient had major surgery during 100 days prior to admission? Yes   Family History   family history includes Breast cancer in an other family member; Cancer (age of onset: 61) in her mother; Diabetes in her mother; Hypertension in her mother; Non-Hodgkin's lymphoma (age of onset: 40) in her brother; Other in her mother; Other (age of onset: 52) in her maternal uncle.   Current Medications   Current Facility-Administered Medications:    acetaminophen (TYLENOL) tablet 650 mg, 650 mg, Oral, Q6H PRN, 650 mg at 11/09/21 1230 **OR** acetaminophen (TYLENOL) suppository 650 mg, 650 mg, Rectal, Q6H PRN,  Nundkumar, Neelesh, MD   Chlorhexidine Gluconate Cloth 2 % PADS 6 each, 6 each, Topical, q morning, Danford, Christopher P, MD, 6 each at 11/15/21 0822   cloNIDine (CATAPRES) tablet 0.1 mg, 0.1 mg, Oral, BID, Cabbell, Kyle, MD, 0.1 mg at 11/15/21 0821   dexamethasone (DECADRON) tablet 4 mg, 4 mg, Oral, Q6H, Nundkumar, Neelesh, MD, 4 mg at 11/15/21 1109   hydrALAZINE (APRESOLINE) injection 10 mg, 10 mg, Intravenous, Q4H PRN, Nundkumar, Neelesh, MD, 10 mg at 11/13/21 2106   HYDROcodone-acetaminophen (NORCO/VICODIN) 5-325 MG per tablet 1 tablet, 1 tablet, Oral, Q4H PRN, Nundkumar, Neelesh, MD, 1 tablet at 11/15/21 0824   labetalol (NORMODYNE) injection 10-40 mg, 10-40 mg, Intravenous, Q10 min PRN, Nundkumar, Neelesh, MD, 40 mg at 11/13/21 2312   lactated ringers infusion, , Intravenous, Continuous, Nundkumar, Neelesh, MD   levETIRAcetam (KEPPRA) tablet 500 mg, 500 mg, Oral, BID, Nundkumar, Neelesh, MD, 500 mg at 11/15/21 0821   LORazepam (ATIVAN) injection 1 mg, 1 mg, Intravenous, Q8H PRN, Nundkumar, Neelesh, MD   morphine (PF) 2 MG/ML injection 1-2 mg, 1-2 mg, Intravenous, Q2H PRN, Nundkumar, Neelesh, MD, 1 mg at 11/12/21 1453   ondansetron (ZOFRAN) tablet 4 mg, 4 mg, Oral, Q6H PRN, 4 mg at 11/10/21 0909 **OR** ondansetron (ZOFRAN) injection 4 mg, 4 mg, Intravenous, Q6H PRN, Nundkumar, Neelesh, MD, 4 mg at 11/13/21 2311   oxyCODONE (Oxy IR/ROXICODONE) immediate release tablet 5 mg, 5 mg, Oral, Q6H PRN, Nundkumar, Neelesh, MD, 5 mg at 11/14/21 1812   pantoprazole (PROTONIX) EC tablet 40 mg, 40 mg, Oral, QHS, Chen, Lydia D, RPH, 40 mg at 11/14/21 2132   prochlorperazine (COMPAZINE) injection 10 mg, 10 mg, Intravenous, Q6H PRN, Nundkumar, Neelesh, MD   promethazine (PHENERGAN) tablet 12.5-25 mg, 12.5-25 mg, Oral, Q4H PRN, Nundkumar, Neelesh, MD   senna-docusate (Senokot-S) tablet 1 tablet, 1 tablet, Oral, QHS PRN, Nundkumar, Neelesh, MD, 1 tablet at 11/11/21 2107   Patients Current Diet:  Diet Order                   Diet Carb Modified Fluid consistency: Thin; Room service appropriate? Yes with Assist  Diet effective now                       Precautions / Restrictions Precautions Precautions: Fall Restrictions Weight Bearing Restrictions: No    Has the patient had 2 or more falls or a fall with injury in the past year? 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 Independent   Indoor Mobility: Did the patient need assistance with walking from room to room (with or without device)? Independent     Stairs: Did the patient need assistance with internal or external stairs (with or without device)? Independent   Functional Cognition: Did the patient need help planning regular tasks such as shopping or remembering to take medications? Independent   Patient Information Are you of Hispanic, Latino/a,or Spanish origin?: A. No, not of Hispanic, Latino/a, or Spanish origin What is your race?: A. White Do you need or want an interpreter to communicate with a doctor or health care staff?: 0. No   Patient's Response To:  Health Literacy and Transportation Is the patient able to respond to health literacy and transportation needs?: Yes Health Literacy - How often do you need to have someone help you when you read instructions, pamphlets, or other written material from your doctor or pharmacy?: Never In the past 12 months, has lack of transportation kept you from medical appointments or from getting medications?: No In the past 12 months, has lack of transportation kept you from meetings, work, or from getting things needed for daily living?: No   Home Assistive Devices / Equipment Home Equipment: BSC/3in1, Hospital bed, Shower seat   Prior Device Use: Indicate devices/aids used by the patient prior to current illness, exacerbation or  injury? None of the above   Current Functional Level Cognition   Overall Cognitive Status: Impaired/Different from baseline Current Attention Level: Selective Orientation Level: Oriented X4 Following Commands: Follows one step commands consistently Safety/Judgement: Decreased awareness of deficits General Comments: slow of speech and command following    Extremity Assessment (includes Sensation/Coordination)   Upper Extremity Assessment: RUE deficits/detail RUE Deficits / Details: 3/5 grip strength, some activity movemenr of wrist and elbow. No shoulder AROM RUE Sensation: WNL RUE Coordination: decreased fine motor, decreased gross motor  Lower Extremity Assessment: Defer to PT evaluation RLE Deficits / Details: 0/5 throughout with stiffness noted R ankle and knee. Pt reports she has been having cramping in R calf and adductors, not suring re eval though. RLE Sensation: decreased light touch, decreased proprioception RLE Coordination: decreased gross motor, decreased fine motor     ADLs   Overall ADL's : Needs assistance/impaired Eating/Feeding: Minimal assistance, Sitting Grooming: Minimal assistance, Sitting Upper Body Bathing: Moderate assistance, Sitting Lower Body Bathing: Total assistance, +2 for physical assistance, +2 for safety/equipment, Sit to/from stand Upper Body Dressing : Moderate assistance, Sitting Lower Body Dressing: Maximal assistance, +2 for physical assistance, +2 for safety/equipment, Sit to/from stand Toilet Transfer: Maximal assistance, +2 for physical assistance, +2 for safety/equipment, Stand-pivot Toileting- Clothing Manipulation and Hygiene: Maximal assistance, +2 for physical assistance, +2 for safety/equipment, Sit to/from stand Functional mobility during ADLs: Maximal assistance, +2 for physical assistance, +2 for safety/equipment General ADL Comments: limited by RLE and RUE weakness, impaired balance poor activity tolerance     Mobility   Overal  bed mobility: Needs Assistance Bed Mobility: Rolling, Supine to Sit Rolling: Min guard (toward rt) Supine to sit: Mod assist, +2 for physical assistance, +2 for safety/equipment Sit to supine: Max assist, +2 for physical assistance General bed mobility comments: Assist to bring RLE off of bed and elevate trunk into sitting.     Transfers   Overall transfer level: Needs assistance Equipment used: Ambulation equipment used, 2 person hand held assist Transfers: Sit to/from Stand, Bed to chair/wheelchair/BSC Sit to Stand: Mod assist, +2 physical assistance, +2 safety/equipment Bed to/from chair/wheelchair/BSC transfer type:: Via Lift equipment Transfer via Lift Equipment: Stedy General transfer comment: Assist to bring hips up and for balance. Rt knee blocked by Stedy pad. Pt's rt foot   maintained good position with sitting and standing with Stedy. Stood x 3 with Stedy     Ambulation / Gait / Stairs / Wheelchair Mobility   Ambulation/Gait Ambulation/Gait assistance: Supervision Gait Distance (Feet): 300 Feet Assistive device: None Gait Pattern/deviations: Step-through pattern, Decreased stride length General Gait Details: unable Gait velocity: WFL Gait velocity interpretation: >2.62 ft/sec, indicative of community ambulatory Pre-gait activities: Stood in Stedy x 3 for 1-2 minutes and worked to try to activate hip/knee extension with manual facilitation.     Posture / Balance Dynamic Sitting Balance Sitting balance - Comments: Prefers to have UE support but not required Balance Overall balance assessment: Needs assistance Sitting-balance support: Feet supported Sitting balance-Leahy Scale: Fair Sitting balance - Comments: Prefers to have UE support but not required Postural control: Right lateral lean, Posterior lean Standing balance support: Bilateral upper extremity supported Standing balance-Leahy Scale: Poor Standing balance comment: Stood x 3 with Stedy for 1-2 minutes with min  assist to maintain High level balance activites: Head turns (looking up and down) High Level Balance Comments: pt with min HHA for performing higher level balance challenges with gait     Special needs/care consideration        Previous Home Environment  Living Arrangements:  (23 year old daughter, Jazmine)  Lives With: Daughter Available Help at Discharge:  (daughter wil have she and friends 24/7) Type of Home: Apartment Home Layout: One level Home Access: Stairs to enter Entrance Stairs-Number of Steps: 3 Bathroom Shower/Tub: Tub/shower unit Bathroom Toilet: Standard Bathroom Accessibility: Yes How Accessible: Accessible via walker Home Care Services: No   Discharge Living Setting Plans for Discharge Living Setting: Patient's home, Lives with (comment), Apartment (23 year old daughter) Type of Home at Discharge: Apartment Discharge Home Layout: One level Discharge Home Access: Stairs to enter Entrance Stairs-Rails: None Entrance Stairs-Number of Steps: 3 Discharge Bathroom Shower/Tub: Tub/shower unit Discharge Bathroom Toilet: Standard Discharge Bathroom Accessibility: Yes How Accessible: Accessible via walker Does the patient have any problems obtaining your medications?: No   Social/Family/Support Systems Patient Roles: Parent (employee; she is a CNA) Contact Information: daughter, Jazmine Anticipated Caregiver: daughter and friends who are nurses Anticipated Caregiver's Contact Information: see contacts Ability/Limitations of Caregiver: daughter works as bartender, she is a CNA and begins school at [employer] Caregiver Availability: 24/7 Discharge Plan Discussed with Primary Caregiver: Yes Is Caregiver In Agreement with Plan?: Yes Does Caregiver/Family have Issues with Lodging/Transportation while Pt is in Rehab?: No   Goals Patient/Family Goal for Rehab: min assist with PT and OT at wheelchair level, supervision with SLP Expected length of stay: ELOS 14 to 20  days Additional Information: Oncology follow up needed Pt/Family Agrees to Admission and willing to participate: Yes Program Orientation Provided & Reviewed with Pt/Caregiver Including Roles  & Responsibilities: Yes   Decrease burden of Care through IP rehab admission: n/a   Possible need for SNF placement upon discharge: not anticipated   Patient Condition: I have reviewed medical records from [place] hospital, spoken with  patient and daughter. I met with patient at the bedside for inpatient rehabilitation assessment.  Patient will benefit from ongoing PT, OT, and SLP, can actively participate in 3 hours of therapy a day 5 days of the week, and can make measurable gains during the admission.  Patient will also benefit from the coordinated team approach during an Inpatient Acute Rehabilitation admission.  The patient will receive intensive therapy as well as Rehabilitation physician, nursing, social worker, and care management interventions.    Due to bladder management, bowel management, safety, skin/wound care, disease management, medication administration, pain management, and patient education the patient requires 24 hour a day rehabilitation nursing.  The patient is currently Min +2- mod A with mobility and basic ADLs.  Discharge setting and therapy post discharge at home with home health is anticipated.  Patient has agreed to participate in the Acute Inpatient Rehabilitation Program and will admit today.   Preadmission Screen Completed By:  Barbara Boyette RN MSN Boyette, Barbara Godwin, 11/15/2021 12:04 PM ______________________________________________________________________   Discussed status with Dr. Lylliana Kitamura on 11/17/21 at 930 and received approval for admission today.   Admission Coordinator: Barbara Boyette RN MSN with updates by Laura Staley, MS, CCC-SLP  Boyette, Barbara Godwin, RN, time 2:16pm /Date 11/17/21    Assessment/Plan: Diagnosis: right brain mass s/p resection Does the need  for close, 24 hr/day Medical supervision in concert with the patient's rehab needs make it unreasonable for this patient to be served in a less intensive setting? Yes Co-Morbidities requiring supervision/potential complications: breast cancer, right hemiparesis Due to bladder management, bowel management, safety, skin/wound care, disease management, medication administration, pain management, and patient education, does the patient require 24 hr/day rehab nursing? Yes Does the patient require coordinated care of a physician, rehab nurse, PT, OT, and SLP to address physical and functional deficits in the context of the above medical diagnosis(es)? Yes Addressing deficits in the following areas: balance, endurance, locomotion, strength, transferring, bowel/bladder control, bathing, dressing, feeding, grooming, toileting, cognition, and psychosocial support Can the patient actively participate in an intensive therapy program of at least 3 hrs of therapy 5 days a week? Yes The potential for patient to make measurable gains while on inpatient rehab is excellent Anticipated functional outcomes upon discharge from inpatient rehab: min assist PT, min assist OT, modified independent SLP at w/c level Estimated rehab length of stay to reach the above functional goals is: 14-20 days Anticipated discharge destination: Home 10. Overall Rehab/Functional Prognosis: excellent     MD Signature: Jesse Nosbisch T. Quincey Nored, MD, FAAPMR [facility] Medical Director Rehabilitation Services 11/17/2021  

## 2021-11-15 NOTE — Progress Notes (Signed)
  NEUROSURGERY PROGRESS NOTE   No issues overnight. Pt with no new complaints.  EXAM:  BP 133/65 (BP Location: Right Leg)   Pulse (!) 53   Temp 97.9 F (36.6 C) (Oral)   Resp 14   Ht '5\' 11"'$  (1.803 m)   Wt (!) 143 kg   LMP 09/29/2020 (Approximate)   SpO2 97%   BMI 43.97 kg/m   Awake, alert, oriented  Speech fluent, appropriate  CN grossly intact  5/5 LUE/LLE 2-3/5 right finger flex/ext and grip, minimal proximal movement ? Flicker right hip flexion, no distal movement  IMPRESSION:  46 y.o. female POD# 5 s/p resection of left posterior frontal lesion with slowly improving dense SMA syndrome.  PLAN: - Cont PT/OT - Stable for CIR when insurance approved/bed available  Consuella Lose, MD [facility]

## 2021-11-16 ENCOUNTER — Encounter: Payer: Self-pay | Admitting: Hematology and Oncology

## 2021-11-16 ENCOUNTER — Ambulatory Visit: Payer: Medicaid Other | Admitting: Plastic Surgery

## 2021-11-16 NOTE — Progress Notes (Addendum)
                                                                                                                                                             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  Intent: Palliative  Radiation Treatment Dates: 11/10/2021 through 11/10/2021 Site Technique Total Dose (Gy) Dose per Fx (Gy) Completed Fx Beam Energies  Brain: Brain [id number] IMRT 18/18 18 1/1 6XFFF   Narrative: The patient tolerated radiation therapy relatively well.    Plan: The patient will receive a call in about one month from the radiation oncology department. She will continue to be followed in the brain oncology program and have follow up with Dr. IChryl Heckas well.   ________________________________________________    ACarola Rhine [facility]

## 2021-11-16 NOTE — Progress Notes (Signed)
SLP Cancellation Note  Patient Details Name: Sheryl Mcmillan MRN: [id number] DOB: [birth year]   Cancelled treatment:       Reason Eval/Treat Not Completed: SLP screened, no needs identified, will sign off. Checked in with pt, she is eating and drinking well, no concerns. Will sign off   Adisyn Ruscitti, Katherene Ponto 11/16/2021, 9:02 AM

## 2021-11-16 NOTE — Progress Notes (Incomplete)
Inpatient Rehabilitation Admission Medication Review by a Pharmacist  A complete drug regimen review was completed for this patient to identify any potential clinically significant medication issues.  High Risk Drug Classes Is patient taking? Indication by Medication  Antipsychotic Yes Compazine- N/V  Anticoagulant No   Antibiotic No   Opioid Yes OxyIR, Norco- acute pain  Antiplatelet No   Hypoglycemics/insulin No   Vasoactive Medication Yes Clonidine- hypertension  Chemotherapy No   Other Yes Keppra- seizure prophylaxis Protonix- GERD     Type of Medication Issue Identified Description of Issue Recommendation(s)  Drug Interaction(s) (clinically significant)     Duplicate Therapy     Allergy     No Medication Administration End Date     Incorrect Dose     Additional Drug Therapy Needed  Chemotherapy for Breast CA w/ Left frontal brain mass newly diagnosed 11/07/2021 She has completed s full course of carbo/doce/trastuzi/pertuzi (06/14/2020 through 09/28/2020) and continued trastuzi x1 year (through 05/2021).   Awaiting final pathology report before introduction of any new chemotherapy  Significant med changes from prior encounter (inform family/care partners about these prior to discharge).    Other       Clinically significant medication issues were identified that warrant physician communication and completion of prescribed/recommended actions by midnight of the next day:  No  Time spent performing this drug regimen review (minutes):  30  Comments:   Please monitor for final pathology report release. Consult Oncology [Dr. Lonell Face MD] for continued cancer treatment. ([place] [phone])  Vaughan Basta BS, PharmD, BCPS Clinical Pharmacist 11/16/2021 10:53 AM  Contact: [phone] after 3 PM  "Be curious, not judgmental..." -Jamal Maes

## 2021-11-16 NOTE — Progress Notes (Addendum)
  NEUROSURGERY PROGRESS NOTE   No issues overnight. Pt with no new complaints.  EXAM:  BP (!) 147/84 (BP Location: Right Leg)   Pulse 66   Temp 97.6 F (36.4 C) (Oral)   Resp 18   Ht '5\' 11"'$  (1.803 m)   Wt (!) 143 kg   LMP 09/29/2020 (Approximate)   SpO2 97%   BMI 43.97 kg/m   Awake, alert, oriented  Speech fluent, appropriate  CN grossly intact  5/5 LUE/LLE 3-4/5 right finger flex/ext and grip, minimal proximal movement ? Flicker right hip flexion, no distal movement  IMPRESSION:  46 y.o. female POD# 6 s/p resection of left posterior frontal lesion with slowly improving dense SMA syndrome.  PLAN: - Cont PT/OT - Stable for CIR when insurance approved/bed available  Consuella Lose, MD [facility]

## 2021-11-16 NOTE — Progress Notes (Signed)
Occupational Therapy Treatment Patient Details Name: Sheryl Mcmillan MRN: [id number] DOB: 11-Oct-1975 Today's Date: 11/16/2021   History of present illness Pt is a 46 y.o. female who presented to the ED 5/16 with R side weakness.  MRI revealed partially solid contrast-enhancing mass in the superior left hemisphere with large area of vasogenic edema, most consistent with  metastatic disease. Now s/p L frontal crani tumor excision 5/19. PMH: left breast cancer (ER negative, PR positive, BRCA1 positive) s/p neoadjuvant chemotherapy, bilateral mastectomy, hysterectomy and bilateral SOO   OT comments  Pt seen in conjunction with PT to maximizes pts activity tolerance and progress functional mobility. Pt continues to present with RUE hemiparesis and impaired balance however pt is highly motivated and making excellent gains toward OT goals with pt able to stand in stedy with MIN A +2. Focused session on increasing activity tolerance and RLE/RUE NMR via lateral weight shifts in standing as precursor to functional gait and AAROM with RUE with pt completing therapeutic activities as indicated below. Pt continues to be an excellent acute inpatient rehab candidate d/t her strong family support, intrinsically motivated nature, and high level of prior function. Feel pt would progress quickly making big strides in inpatient rehab, will continue to follow for acute OT needs.     Recommendations for follow up therapy are one component of a multi-disciplinary discharge planning process, led by the attending physician.  Recommendations may be updated based on patient status, additional functional criteria and insurance authorization.    Follow Up Recommendations  Acute inpatient rehab (3hours/day)    Assistance Recommended at Discharge Frequent or constant Supervision/Assistance  Patient can return home with the following  Two people to help with walking and/or transfers;Assistance with cooking/housework;Two people to  help with bathing/dressing/bathroom;Assist for transportation;Help with stairs or ramp for entrance   Equipment Recommendations  Other (comment) (pending progression)    Recommendations for Other Services      Precautions / Restrictions Precautions Precautions: Fall Restrictions Weight Bearing Restrictions: No       Mobility Bed Mobility Overal bed mobility: Needs Assistance Bed Mobility: Supine to Sit     Supine to sit: Min assist, +2 for physical assistance     General bed mobility comments: light MIN A to elevate trunk; able to use hook method to advance BLEs to EOB    Transfers Overall transfer level: Needs assistance Equipment used: Ambulation equipment used Transfers: Sit to/from Stand, Bed to chair/wheelchair/BSC Sit to Stand: Min assist, +2 physical assistance, +2 safety/equipment           General transfer comment: MIN A +2 to rise from EOB with use of stedy; MIN guard to rise from seat of stedy Transfer via Lift Equipment: Stedy   Balance Overall balance assessment: Needs assistance Sitting-balance support: Feet supported Sitting balance-Leahy Scale: Fair Sitting balance - Comments: Prefers to have UE support but not required   Standing balance support: Bilateral upper extremity supported Standing balance-Leahy Scale: Fair Standing balance comment: able to stand in stedy for ~ 2 mins with min guard for standing balance with BUEs supported                           ADL either performed or assessed with clinical judgement   ADL Overall ADL's : Needs assistance/impaired                 Upper Body Dressing : Minimal assistance;Bed level Upper Body Dressing Details (indicate cue type  and reason): to don gown, good carryover of hemi technique     Toilet Transfer: Minimal assistance;+2 for safety/equipment;+2 for physical assistance Toilet Transfer Details (indicate cue type and reason): utilized stedy for transfer to chair but abel to  sit>stand to stedy from EOB with MIN A +2; min guard to rise from seat of stedy         Functional mobility during ADLs: Minimal assistance;+2 for safety/equipment;+2 for physical assistance (sit>stand from EOB with stedy) General ADL Comments: emphasis on progressing functional mobility, pregait activities, RUE AAROM    Extremity/Trunk Assessment Upper Extremity Assessment Upper Extremity Assessment: RUE deficits/detail RUE Deficits / Details: 3-/5 grip strength, synergy developing in elbow, 2/5 wrist mmt. No shoulder AROM RUE Sensation: WNL RUE Coordination: decreased fine motor;decreased gross motor   Lower Extremity Assessment Lower Extremity Assessment: Defer to PT evaluation   Cervical / Trunk Assessment Cervical / Trunk Assessment:  (increased body habitus)    Vision Baseline Vision/History: 0 No visual deficits (per gross assessment)     Perception Perception Perception: Within Functional Limits   Praxis Praxis Praxis: Intact    Cognition Arousal/Alertness: Awake/alert Behavior During Therapy: WFL for tasks assessed/performed Overall Cognitive Status: Within Functional Limits for tasks assessed                                          Exercises Other Exercises Other Exercises: modifed push ups while standing in stedy Other Exercises: lateral weight shifts in stedy as precursor for gait training and NMR Other Exercises: horizontal shoulder ADD/ABD with RUE sliding R and L on arm rest of stedy; encouraged pt to practice on side table    Shoulder Instructions       General Comments VSS on RA; pt daughter present during session    Pertinent Vitals/ Pain       Pain Assessment Faces Pain Scale: No hurt  Home Living                                          Prior Functioning/Environment              Frequency  Min 2X/week        Progress Toward Goals  OT Goals(current goals can now be found in the care plan  section)  Progress towards OT goals: Progressing toward goals  Acute Rehab OT Goals Patient Stated Goal: to go to rehab OT Goal Formulation: With patient Time For Goal Achievement: 11/22/21 Potential to Achieve Goals: Good  Plan Discharge plan remains appropriate;Frequency remains appropriate    Co-evaluation      Reason for Co-Treatment: To address functional/ADL transfers;Complexity of the patient's impairments (multi-system involvement)          AM-PAC OT "6 Clicks" Daily Activity     Outcome Measure   Help from another person eating meals?: None Help from another person taking care of personal grooming?: A Little Help from another person toileting, which includes using toliet, bedpan, or urinal?: A Lot Help from another person bathing (including washing, rinsing, drying)?: A Lot Help from another person to put on and taking off regular upper body clothing?: A Little Help from another person to put on and taking off regular lower body clothing?: A Lot 6 Click Score: 16    End of  Session Equipment Utilized During Treatment: Gait belt;Other (comment) (stedy)  OT Visit Diagnosis: Other abnormalities of gait and mobility (R26.89);Muscle weakness (generalized) (M62.81)   Activity Tolerance Patient tolerated treatment well   Patient Left in chair;with call bell/phone within reach;with chair alarm set   Nurse Communication Mobility status        Time: 3943-2003 OT Time Calculation (min): 30 min  Charges: OT General Charges $OT Visit: 1 Visit OT Treatments $Therapeutic Activity: 8-22 mins  Harley Alto., COTA/L Acute Rehabilitation Services [phone]   Precious Haws 11/16/2021, 1:52 PM

## 2021-11-16 NOTE — Progress Notes (Signed)
Physical Therapy Treatment Patient Details Name: Sheryl Mcmillan MRN: [id number] DOB: 1975-11-30 Today's Date: 11/16/2021   History of Present Illness Pt is a 46 y.o. female who presented to the ED 5/16 with R side weakness.  MRI revealed partially solid contrast-enhancing mass in the superior left hemisphere with large area of vasogenic edema, most consistent with  metastatic disease. Now s/p L frontal crani tumor excision 5/19. PMH: left breast cancer (ER negative, PR positive, BRCA1 positive) s/p neoadjuvant chemotherapy, bilateral mastectomy, hysterectomy and bilateral SOO    PT Comments    Pt continues to show daily improvement with mobility. Pt is highly motivated and has good body awareness and problem solving skills which I think will benefit her greatly in her recovery. I think with a stay on acute inpatient rehab she can reach a supervision/min assist level.    Recommendations for follow up therapy are one component of a multi-disciplinary discharge planning process, led by the attending physician.  Recommendations may be updated based on patient status, additional functional criteria and insurance authorization.  Follow Up Recommendations  Acute inpatient rehab (3hours/day)     Assistance Recommended at Discharge Frequent or constant Supervision/Assistance  Patient can return home with the following Two people to help with walking and/or transfers;Two people to help with bathing/dressing/bathroom;Assistance with cooking/housework;Direct supervision/assist for medications management;Assist for transportation;Help with stairs or ramp for entrance;Direct supervision/assist for financial management   Equipment Recommendations  Wheelchair (measurements PT);Wheelchair cushion (measurements PT)    Recommendations for Other Services       Precautions / Restrictions Precautions Precautions: Fall Restrictions Weight Bearing Restrictions: No     Mobility  Bed Mobility Overal bed  mobility: Needs Assistance Bed Mobility: Supine to Sit Rolling: Supervision (toward rt)   Supine to sit: Min assist, +2 for physical assistance     General bed mobility comments: light MIN A to elevate trunk; able to use hook method to advance BLEs to EOB    Transfers Overall transfer level: Needs assistance Equipment used: Ambulation equipment used Transfers: Sit to/from Stand, Bed to chair/wheelchair/BSC Sit to Stand: Min assist, +2 physical assistance, +2 safety/equipment          Lateral/Scoot Transfers: +2 physical assistance, Min assist General transfer comment: +2 min to bring hips up from EOB with use of stedy; Min guard to rise from seat of stedy. +2 min assist to perform lateral scoots up/down EOB with assist using pad to move hips. Transfer via Lift Equipment: Stedy  Ambulation/Gait             Pre-gait activities: Stood in [place] x 2 for 2 minutes on first with working on trying to activate RLE. Manual facilitation but no active movement detected     Stairs             Wheelchair Mobility    Modified Rankin (Stroke Patients Only)       Balance Overall balance assessment: Needs assistance Sitting-balance support: Feet supported Sitting balance-Leahy Scale: Fair Sitting balance - Comments: Prefers to have UE support but not required   Standing balance support: Bilateral upper extremity supported Standing balance-Leahy Scale: Poor Standing balance comment: able to stand in stedy for ~ 2 mins with min guard for standing balance with BUEs supported                            Cognition Arousal/Alertness: Awake/alert Behavior During Therapy: WFL for tasks assessed/performed Overall Cognitive Status: Within Functional  Limits for tasks assessed                                          Exercises Other Exercises Other Exercises: lateral weight shifts in stedy with assist to stabilize RLE    General Comments General  comments (skin integrity, edema, etc.): VSS on RA; pt daughter present during session      Pertinent Vitals/Pain Pain Assessment Pain Assessment: No/denies pain    Home Living                          Prior Function            PT Goals (current goals can now be found in the care plan section) Progress towards PT goals: Progressing toward goals    Frequency    Min 4X/week      PT Plan Current plan remains appropriate    Co-evaluation   Reason for Co-Treatment: To address functional/ADL transfers;Complexity of the patient's impairments (multi-system involvement)          AM-PAC PT "6 Clicks" Mobility   Outcome Measure  Help needed turning from your back to your side while in a flat bed without using bedrails?: A Lot Help needed moving from lying on your back to sitting on the side of a flat bed without using bedrails?: A Little Help needed moving to and from a bed to a chair (including a wheelchair)?: Total Help needed standing up from a chair using your arms (e.g., wheelchair or bedside chair)?: Total Help needed to walk in hospital room?: Total Help needed climbing 3-5 steps with a railing? : Total 6 Click Score: 9    End of Session Equipment Utilized During Treatment: Gait belt Activity Tolerance: Patient tolerated treatment well Patient left: in chair;with call bell/phone within reach;with family/visitor present;with chair alarm set Nurse Communication: Mobility status;Need for lift equipment PT Visit Diagnosis: Difficulty in walking, not elsewhere classified (R26.2);Hemiplegia and hemiparesis Hemiplegia - Right/Left: Right Hemiplegia - dominant/non-dominant: Dominant Hemiplegia - caused by: Unspecified     Time: 1305-1340 PT Time Calculation (min) (ACUTE ONLY): 35 min  Charges:  $Therapeutic Activity: 8-22 mins                     [place] [facility] [place] 11/16/2021, 3:08  PM

## 2021-11-16 NOTE — Progress Notes (Signed)
IP rehab admissions - awaiting call back from insurance carrier regarding potential inpatient rehab admission.  Will update all once we hear back from insurance case manager.  Call for questions.  [phone]

## 2021-11-17 ENCOUNTER — Other Ambulatory Visit: Payer: Self-pay

## 2021-11-17 ENCOUNTER — Inpatient Hospital Stay (HOSPITAL_COMMUNITY)
Admission: RE | Admit: 2021-11-17 | Discharge: 2021-12-08 | DRG: 057 | Disposition: A | Discharge status: Home / Self Care | Payer: Medicaid Other | Source: Intra-hospital | Attending: Physical Medicine and Rehabilitation | Admitting: Physical Medicine and Rehabilitation

## 2021-11-17 ENCOUNTER — Encounter (HOSPITAL_COMMUNITY): Payer: Self-pay | Admitting: Physical Medicine and Rehabilitation

## 2021-11-17 DIAGNOSIS — R030 Elevated blood-pressure reading, without diagnosis of hypertension: Secondary | ICD-10-CM | POA: Diagnosis not present

## 2021-11-17 DIAGNOSIS — R739 Hyperglycemia, unspecified: Secondary | ICD-10-CM | POA: Diagnosis not present

## 2021-11-17 DIAGNOSIS — Z9049 Acquired absence of other specified parts of digestive tract: Secondary | ICD-10-CM

## 2021-11-17 DIAGNOSIS — Z90710 Acquired absence of both cervix and uterus: Secondary | ICD-10-CM | POA: Diagnosis not present

## 2021-11-17 DIAGNOSIS — Z888 Allergy status to other drugs, medicaments and biological substances status: Secondary | ICD-10-CM

## 2021-11-17 DIAGNOSIS — D496 Neoplasm of unspecified behavior of brain: Secondary | ICD-10-CM | POA: Diagnosis not present

## 2021-11-17 DIAGNOSIS — I10 Essential (primary) hypertension: Secondary | ICD-10-CM

## 2021-11-17 DIAGNOSIS — Z79899 Other long term (current) drug therapy: Secondary | ICD-10-CM

## 2021-11-17 DIAGNOSIS — Z91048 Other nonmedicinal substance allergy status: Secondary | ICD-10-CM | POA: Diagnosis not present

## 2021-11-17 DIAGNOSIS — G8191 Hemiplegia, unspecified affecting right dominant side: Secondary | ICD-10-CM | POA: Diagnosis present

## 2021-11-17 DIAGNOSIS — K219 Gastro-esophageal reflux disease without esophagitis: Secondary | ICD-10-CM | POA: Diagnosis present

## 2021-11-17 DIAGNOSIS — K5901 Slow transit constipation: Secondary | ICD-10-CM

## 2021-11-17 DIAGNOSIS — M1990 Unspecified osteoarthritis, unspecified site: Secondary | ICD-10-CM | POA: Diagnosis present

## 2021-11-17 DIAGNOSIS — E282 Polycystic ovarian syndrome: Secondary | ICD-10-CM | POA: Diagnosis present

## 2021-11-17 DIAGNOSIS — Z9079 Acquired absence of other genital organ(s): Secondary | ICD-10-CM

## 2021-11-17 DIAGNOSIS — Z6841 Body Mass Index (BMI) 40.0 and over, adult: Secondary | ICD-10-CM

## 2021-11-17 DIAGNOSIS — Z90722 Acquired absence of ovaries, bilateral: Secondary | ICD-10-CM | POA: Diagnosis not present

## 2021-11-17 DIAGNOSIS — Z7982 Long term (current) use of aspirin: Secondary | ICD-10-CM | POA: Diagnosis not present

## 2021-11-17 DIAGNOSIS — Z87891 Personal history of nicotine dependence: Secondary | ICD-10-CM | POA: Diagnosis not present

## 2021-11-17 DIAGNOSIS — C7931 Secondary malignant neoplasm of brain: Secondary | ICD-10-CM

## 2021-11-17 DIAGNOSIS — G4700 Insomnia, unspecified: Secondary | ICD-10-CM | POA: Diagnosis present

## 2021-11-17 DIAGNOSIS — Z9013 Acquired absence of bilateral breasts and nipples: Secondary | ICD-10-CM | POA: Diagnosis not present

## 2021-11-17 DIAGNOSIS — Z853 Personal history of malignant neoplasm of breast: Secondary | ICD-10-CM

## 2021-11-17 DIAGNOSIS — D72829 Elevated white blood cell count, unspecified: Secondary | ICD-10-CM | POA: Diagnosis not present

## 2021-11-17 DIAGNOSIS — E669 Obesity, unspecified: Secondary | ICD-10-CM | POA: Diagnosis not present

## 2021-11-17 DIAGNOSIS — Z85841 Personal history of malignant neoplasm of brain: Secondary | ICD-10-CM | POA: Diagnosis not present

## 2021-11-17 DIAGNOSIS — Z9189 Other specified personal risk factors, not elsewhere classified: Secondary | ICD-10-CM | POA: Diagnosis not present

## 2021-11-17 DIAGNOSIS — E1169 Type 2 diabetes mellitus with other specified complication: Secondary | ICD-10-CM | POA: Diagnosis not present

## 2021-11-17 DIAGNOSIS — R21 Rash and other nonspecific skin eruption: Secondary | ICD-10-CM | POA: Diagnosis not present

## 2021-11-17 DIAGNOSIS — R000 Tachycardia, unspecified: Secondary | ICD-10-CM | POA: Diagnosis not present

## 2021-11-17 DIAGNOSIS — K5900 Constipation, unspecified: Secondary | ICD-10-CM | POA: Diagnosis present

## 2021-11-17 DIAGNOSIS — M7989 Other specified soft tissue disorders: Secondary | ICD-10-CM | POA: Diagnosis not present

## 2021-11-17 DIAGNOSIS — Z713 Dietary counseling and surveillance: Secondary | ICD-10-CM

## 2021-11-17 DIAGNOSIS — T380X5A Adverse effect of glucocorticoids and synthetic analogues, initial encounter: Secondary | ICD-10-CM | POA: Diagnosis not present

## 2021-11-17 MED ORDER — BISACODYL 10 MG RE SUPP: 10.0000 mg | Freq: Every day | RECTAL | Status: DC | PRN

## 2021-11-17 MED ORDER — TRAZODONE HCL 50 MG PO TABS
25.0000 mg | ORAL_TABLET | Freq: Every evening | ORAL | Status: DC | PRN
  Filled 2021-11-17: qty 1

## 2021-11-17 MED ORDER — PROCHLORPERAZINE 25 MG RE SUPP: 12.5000 mg | Freq: Four times a day (QID) | RECTAL | Status: DC | PRN

## 2021-11-17 MED ORDER — ACETAMINOPHEN 325 MG PO TABS
325.0000 mg | ORAL_TABLET | ORAL | Status: DC | PRN
  Administered 2021-12-06 – 2021-12-07 (×2): 650 mg via ORAL
  Filled 2021-11-17 (×2): qty 2

## 2021-11-17 MED ORDER — SENNOSIDES-DOCUSATE SODIUM 8.6-50 MG PO TABS: 1.0000 | ORAL_TABLET | Freq: Every evening | ORAL | Status: DC | PRN

## 2021-11-17 MED ORDER — CLONIDINE HCL 0.1 MG PO TABS
0.1000 mg | ORAL_TABLET | Freq: Two times a day (BID) | ORAL | Status: DC
  Administered 2021-11-17 – 2021-12-08 (×42): 0.1 mg via ORAL
  Filled 2021-11-17 (×42): qty 1

## 2021-11-17 MED ORDER — POLYETHYLENE GLYCOL 3350 17 G PO PACK
17.0000 g | PACK | Freq: Every day | ORAL | Status: DC
  Administered 2021-11-18 – 2021-11-22 (×5): 17 g via ORAL
  Filled 2021-11-17 (×7): qty 1

## 2021-11-17 MED ORDER — LEVETIRACETAM 500 MG PO TABS
500.0000 mg | ORAL_TABLET | Freq: Two times a day (BID) | ORAL | Status: DC
  Administered 2021-11-17 – 2021-11-22 (×11): 500 mg via ORAL
  Filled 2021-11-17 (×11): qty 1

## 2021-11-17 MED ORDER — PROCHLORPERAZINE MALEATE 5 MG PO TABS: 5.0000 mg | ORAL_TABLET | Freq: Four times a day (QID) | ORAL | Status: DC | PRN

## 2021-11-17 MED ORDER — BISACODYL 5 MG PO TBEC
5.0000 mg | DELAYED_RELEASE_TABLET | Freq: Every day | ORAL | Status: DC | PRN
  Administered 2021-11-23: 5 mg via ORAL
  Filled 2021-11-17: qty 1

## 2021-11-17 MED ORDER — ENOXAPARIN SODIUM 30 MG/0.3ML IJ SOSY
30.0000 mg | PREFILLED_SYRINGE | Freq: Two times a day (BID) | INTRAMUSCULAR | Status: DC
  Administered 2021-11-17 – 2021-11-26 (×18): 30 mg via SUBCUTANEOUS
  Filled 2021-11-17 (×18): qty 0.3

## 2021-11-17 MED ORDER — DEXAMETHASONE 4 MG PO TABS
2.0000 mg | ORAL_TABLET | Freq: Two times a day (BID) | ORAL | Status: DC
Start: 2021-11-17 — End: 2021-12-08

## 2021-11-17 MED ORDER — DEXAMETHASONE 4 MG PO TABS
4.0000 mg | ORAL_TABLET | Freq: Four times a day (QID) | ORAL | Status: DC
  Administered 2021-11-17 – 2021-11-20 (×11): 4 mg via ORAL
  Filled 2021-11-17 (×11): qty 1

## 2021-11-17 MED ORDER — FLEET ENEMA 7-19 GM/118ML RE ENEM: 1.0000 | ENEMA | Freq: Once | RECTAL | Status: DC | PRN

## 2021-11-17 MED ORDER — DIPHENHYDRAMINE HCL 12.5 MG/5ML PO ELIX
12.5000 mg | ORAL_SOLUTION | Freq: Four times a day (QID) | ORAL | Status: DC | PRN
  Administered 2021-11-23: 25 mg via ORAL
  Filled 2021-11-17: qty 10

## 2021-11-17 MED ORDER — HYDROCODONE-ACETAMINOPHEN 5-325 MG PO TABS
1.0000 | ORAL_TABLET | ORAL | Status: DC | PRN
  Administered 2021-11-18 – 2021-11-23 (×2): 1 via ORAL
  Filled 2021-11-17 (×2): qty 1

## 2021-11-17 MED ORDER — ALUM & MAG HYDROXIDE-SIMETH 200-200-20 MG/5ML PO SUSP: 30.0000 mL | ORAL | Status: DC | PRN

## 2021-11-17 MED ORDER — METHOCARBAMOL 500 MG PO TABS: 500.0000 mg | ORAL_TABLET | Freq: Four times a day (QID) | ORAL | Status: DC | PRN

## 2021-11-17 MED ORDER — PROCHLORPERAZINE EDISYLATE 10 MG/2ML IJ SOLN: 5.0000 mg | Freq: Four times a day (QID) | INTRAMUSCULAR | Status: DC | PRN

## 2021-11-17 MED ORDER — PANTOPRAZOLE SODIUM 40 MG PO TBEC
40.0000 mg | DELAYED_RELEASE_TABLET | Freq: Every day | ORAL | Status: DC
  Administered 2021-11-17 – 2021-12-07 (×21): 40 mg via ORAL
  Filled 2021-11-17 (×21): qty 1

## 2021-11-17 MED ORDER — GUAIFENESIN-DM 100-10 MG/5ML PO SYRP: 5.0000 mL | ORAL_SOLUTION | Freq: Four times a day (QID) | ORAL | Status: DC | PRN

## 2021-11-17 NOTE — Progress Notes (Signed)
Inpatient Rehabilitation Admission Medication Review by a Pharmacist  A complete drug regimen review was completed for this patient to identify any potential clinically significant medication issues.  High Risk Drug Classes Is patient taking? Indication by Medication  Antipsychotic Yes Compazine- N/V  Anticoagulant Yes Lovenox- VTE prophylaxis  Antibiotic No   Opioid Yes Norco- acute pain  Antiplatelet No   Hypoglycemics/insulin No   Vasoactive Medication Yes Clonidine- hypertension  Chemotherapy No   Other Yes Keppra- seizure prophylaxis Protonix- GERD Robaxin- muscle spasms Trazodone- sleep Decadron - metastatic breast cancer with brain lesion s/p resection     Type of Medication Issue Identified Description of Issue Recommendation(s)  Drug Interaction(s) (clinically significant)     Duplicate Therapy     Allergy     No Medication Administration End Date     Incorrect Dose     Additional Drug Therapy Needed  Chemotherapy for Breast CA w/ Left frontal brain mass newly diagnosed 11/07/2021 She has completed s full course of carbo/doce/trastuzi/pertuzi (06/14/2020 through 09/28/2020) and continued trastuzi x1 year (through 05/2021).   Awaiting final pathology report before introduction of any new chemotherapy  Significant med changes from prior encounter (inform family/care partners about these prior to discharge).    Other       Clinically significant medication issues were identified that warrant physician communication and completion of prescribed/recommended actions by midnight of the next day:  No  Time spent performing this drug regimen review (minutes):  30  Comments:   Please monitor for final pathology report release. Consult Oncology [Dr. Lonell Face MD] for continued cancer treatment. ([place])  Maryanna Shape, PharmD, BCPS, BCPPS Clinical Pharmacist   Hildred Laser, PharmD, BCPS Clinical Pharmacist 11/17/2021 5:40  PM      Contact: [phone] after 3 PM  "Be curious, not judgmental..." -Jamal Maes

## 2021-11-17 NOTE — Progress Notes (Incomplete)
Inpatient Rehabilitation Admission Medication Review by a Pharmacist  A complete drug regimen review was completed for this patient to identify any potential clinically significant medication issues.  High Risk Drug Classes Is patient taking? Indication by Medication  Antipsychotic Yes Compazine- N/V  Anticoagulant Yes Lovenox- VTE prophylaxis  Antibiotic No   Opioid Yes OxyIR, Norco- acute pain  Antiplatelet No   Hypoglycemics/insulin No   Vasoactive Medication Yes Clonidine- hypertension  Chemotherapy No   Other Yes Keppra- seizure prophylaxis Protonix- GERD Robaxin- muscle spasms Trazodone- sleep     Type of Medication Issue Identified Description of Issue Recommendation(s)  Drug Interaction(s) (clinically significant)     Duplicate Therapy     Allergy     No Medication Administration End Date     Incorrect Dose     Additional Drug Therapy Needed  Chemotherapy for Breast CA w/ Left frontal brain mass newly diagnosed 11/07/2021 She has completed s full course of carbo/doce/trastuzi/pertuzi (06/14/2020 through 09/28/2020) and continued trastuzi x1 year (through 05/2021).   Awaiting final pathology report before introduction of any new chemotherapy  Significant med changes from prior encounter (inform family/care partners about these prior to discharge).    Other       Clinically significant medication issues were identified that warrant physician communication and completion of prescribed/recommended actions by midnight of the next day:  No  Time spent performing this drug regimen review (minutes):  30  Comments:   Please monitor for final pathology report release. Consult Oncology [Dr. Lonell Face MD] for continued cancer treatment. ([place] [phone])  Vaughan Basta BS, PharmD, BCPS Clinical Pharmacist 11/17/2021 3:08 PM  Contact: [phone] after 3 PM  "Be curious, not judgmental..." -Jamal Maes

## 2021-11-17 NOTE — Discharge Summary (Signed)
Physician Discharge Summary  Patient ID: BRODY KUMP MRN: [id number] DOB/AGE: 09/23/1975 46 y.o.  Admit date: 11/17/2021 Discharge date: 12/09/2021  Discharge Diagnoses:  Principal Problem:   Brain tumor [facility]) Active problems secondary to metastatic brain tumor status post excision Metastatic breast cancer Right hemiplegia Gastroesophageal reflux disease Constipation Elevated blood pressure Morbid obesity Skin rash DVT prophylaxis  Discharged Condition: Stable  Significant Diagnostic Studies: CT HEAD WO CONTRAST  Labs:  Basic Metabolic Panel: Recent Labs  Lab 12/01/21 0621  NA 137  K 3.6  CL 105  CO2 21*  GLUCOSE 224*  BUN 11  CREATININE 0.76  CALCIUM 8.9    CBC: Recent Labs  Lab 12/01/21 0621  WBC 8.7  HGB 13.5  HCT 41.5  MCV 85.2  PLT 214    CBG: Recent Labs  Lab 12/01/21 1514 12/01/21 2119 12/02/21 0350  GLUCAP 126* 246* 185*     Family history.  Mother with hypertension and diabetes as well as history of brain tumor.  Brother with non-Hodgkin's lymphoma.  Negative for pancreatic cancer colon cancer prostate cancer or ovarian cancer  Brief HPI:   Sheryl Mcmillan is a 46 y.o. female presented to the emergency department with right upper extremity weakness and right hand impaired dexterity.  Imaging revealed a solitary left frontal metastasis which appeared to be anterior to the precentral gyrus.  Neurosurgery was consulted and she underwent stereotactic left frontal craniotomy for resection of the tumor.  Surgical pathology was consistent with metastatic poorly differentiated adenocarcinoma breast primary.  Postoperatively she exhibited right hemiplegia greatest in the right lower extremity.   Hospital Course: LAKENZIE MCCLAFFERTY was admitted to rehab 11/17/2021 for inpatient therapies to consist of PT, ST and OT at least three hours five days a week. Past admission physiatrist, therapy team and rehab RN have worked together to provide customized  collaborative inpatient rehab. PRAFO ordered for right heel cord tightness. Melatonin added for sleep. Sorbitol for constipation>>good results. Leukocytosis and elevated CBGs due to steroids monitored. BLE venous duplex performed without evidence of DVT. Contacted Dr. Diamantina Monks contacted 5/31 regarding steroid taper and was reduced to every 8 hours. PRAFO applied to right. Discontinued Keppra 6/2 as she had been seizure free and post-op two weeks. Tachycardia noted on 6/5 and Magonate 250 mg q HS started. Staples removed from incision. Decadron tapered down to 2 mg q 8 hours on 6/6. Lovenox changed to Xarelto due toe excessive bruising of abdomen on 6/4. Steroid weaning began 6/10 to continue through 6/24. CBGs remianed somewhat elevated despite steroid taper and these were monitored for possible resumption of metformin.  Blood pressures were monitored on TID basis and clonidine 0.1 mg BID continued with good control.  CBG checks and SSI was use prn for tighter BS control while on Decadron. Metformin 500 mg daily added 5/30, to BID dosing on 5/31. Discontinued on 6/9.  Rehab course: During patient's stay in rehab weekly team conferences were held to monitor patient's progress, set goals and discuss barriers to discharge. At admission, patient required a range of assistance from dependent for toileting to mod to max assist for dressing and bathing. Required moderate assist for mobility.  Physical exam.  Blood pressure 156/95 pulse 75 temperature 97.5 respirations 18 oxygen saturation 98% room air Constitutional.  No acute distress HEENT Eyes.  Pupils round and reactive to light no discharge without nystagmus Neck.  Supple nontender no JVD without thyromegaly Cardiac regular rate and rhythm without any extra sounds or murmur heard Abdomen.  Soft nontender positive bowel sounds without rebound Respiratory effort normal no respiratory distress without wheeze Musculoskeletal.  Right heel cord is  tight Neurologic.  Alert oriented x3 normal insight and awareness intact memory.  Normal language and speech.  Cranial nerve exam unremarkable for mild right central 7.  Right upper extremity 0/5 deltoids, 2+ to 3/5 biceps triceps and 4-5/5 wrist and fingers.  Right lower extremity 1/5 hip flexors/HAB and 0/5 elsewhere.  DTRs 1+.  She  has had improvement in activity tolerance, balance, postural control as well as ability to compensate for deficits. She has had improvement in functional use RUE/LUE  and RLE/LLE as well as improvement in awareness.  Sessions with emphasis on discharge planning, functional mobility transfers generalized strengthening and endurance.  Patient transferred semireclined sitting edge of bed head of bed elevated modified independent and donned socks and shoes including right GR AFO with set up.  Transferred bed to wheelchair stand pivot without assistive device contact-guard.  Perform wheelchair mobility 150 feet x 2 trials using right hemitechnique.  Ambulates 158 feet x 2 with bariatric rolling walker and contact-guard.  She was able to increase her ambulation distance with rest breaks.  Completed toileting with contact-guard assist and stance with rolling walker than ambulatory transfer contact-guard using rolling walker from toilet to wheelchair.  Full family teaching completed plan discharged to home       Disposition: Discharge to home    Diet: Carb modified  Special Instructions: No driving smoking or alcohol  Medications at discharge. 1.  Tylenol as needed 2.  Clonidine 0.1 mg p.o. twice daily 3.  Decadron 1 mg every 12 hours x 4 days then 1 mg daily x4 days and stop 4.  Hydrocodone 1 tablet every 8 hours as needed pain 5.  Robaxin 500 mg every 6 hours as needed muscle spasms 6.  Protonix 40 mg p.o. nightly 7.  Xarelto 10 mg daily until 12/26/2021 and stop  30-35 minutes were spent completing discharge summary and discharge planning Discharge Instructions      Ambulatory referral to Physical Medicine Rehab   Complete by: As directed    Hospital follow up        Follow-up Information     Consuella Lose, MD Follow up.   Specialty: Neurosurgery Contact information: [address] [place] [address] [phone]         Izora Ribas, MD Follow up.   Specialty: Physical Medicine and Rehabilitation Why: office will call you with follow up appointment Contact information: [address] [place] [address] [phone]         Windell Hummingbird, PA-C Follow up.   Specialty: Physician Assistant Why: Call in 1-2 days for post hospital follow up Contact information: [address] [place] [address] [phone]         Benay Pike, MD Follow up.   Specialty: Hematology and Oncology Contact information: [place] [address] [phone]                 Signed: Lavon Paganini [place] 12/08/2021, 5:08 AM

## 2021-11-17 NOTE — Progress Notes (Signed)
Inpatient Rehab Admissions Coordinator:    I have insurance auth and can admit pt. To CIR this afternoon. RN may call report to [phone].  Clemens Catholic, [place], [facility] Admissions Coordinator  [phone] ([place]) [phone] (office)

## 2021-11-17 NOTE — H&P (Signed)
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  She primarily complained of poor dexterity of her right hand.  Her past medical history is significant for triple negative breast cancer status postchemotherapy, surgery and immunotherapy.  Imaging revealed a solitary left frontal metastasis which appeared to be anterior to the precentral gyrus within the supplementary motor area.  There was also significant associated edema.  Neurosurgery, Dr. Kathyrn Sheriff was consulted.  She was taken to the operating room on 5/19 and underwent stereotactic left frontal craniotomy for resection of the tumor.  Radiation oncology was also consulted for preoperative adjuvant stereotactic radiosurgery.  Surgical pathology was consistent with metastatic poorly differentiated adenocarcinoma breast primary.  Postoperatively, she exhibited right plegia greatest in the right lower extremity. Dr. Chryl Heck, her medical oncologist was also consulted and saw her on 5/23.  CT scans revealed no signs of metastatic disease in the chest, abdomen or pelvis. 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  Past surgical history includes bilateral mastectomies, hysterectomy with bilateral salpingo-oophorectomy and cholecystectomy.  No history of diabetes mellitus, coronary disease, lung or kidney disease.  Quit cigarette smoking in March last year. Smokes marijuana. Rare alcohol use. Daughter, Sheryl Mcmillan, lives with her.  Review of Systems  Constitutional:  Positive for fever. Negative for chills.   HENT:  Negative for hearing loss and sore throat.   Eyes:  Negative for blurred vision and double vision.  Respiratory:  Negative for cough and sputum production.   Cardiovascular:  Negative for chest pain and palpitations.  Gastrointestinal:  Positive for constipation. Negative for abdominal pain, nausea and vomiting.  Genitourinary:  Negative for dysuria and urgency.       Using Purewick  Musculoskeletal:        Right shoulder fatigue/soreness>>not painful  Skin:  Negative for itching.       Has cherry red blush to upper front chest that is normal for her  Neurological:  Positive for focal weakness. Negative for dizziness.  Past Medical History:  Diagnosis Date   Arthritis    Breast cancer ([place])    Family history of non-Hodgkin's lymphoma 06/01/2020   GERD (gastroesophageal reflux disease)    Hemorrhoids    Migraines    PCOS (polycystic ovarian syndrome)    PONV (postoperative nausea and vomiting)    Past Surgical History:  Procedure Laterality Date   APPLICATION OF CRANIAL NAVIGATION Left 11/10/2021   Procedure: APPLICATION OF CRANIAL NAVIGATION;  Surgeon: Consuella Lose, MD;  Location: [facility];  Service: Neurosurgery;  Laterality: Left;   AXILLARY SENTINEL NODE BIOPSY Left 11/07/2020   Procedure: LEFT AXILLARY SENTINEL NODE BIOPSY;  Surgeon: [name], MD;  Location: [facility];  Service: General;  Laterality: Left;   BREAST CYST EXCISION Right 09/22/2021   Procedure: Excision of right axillary excess soft tissue.;  Surgeon: [name], MD;  Location: [facility];  Service: Plastics;  Laterality: Right;   BREAST RECONSTRUCTION WITH PLACEMENT OF TISSUE EXPANDER AND FLEX HD (ACELLULAR HYDRATED DERMIS) Bilateral 11/07/2020   Procedure: BILATERAL BREAST RECONSTRUCTION WITH PLACEMENT OF TISSUE EXPANDER AND FLEX HD (ACELLULAR HYDRATED DERMIS);  Surgeon: [name],  MD;  Location: [facility];  Service: Plastics;  Laterality: Bilateral;   COSMETIC SURGERY      CRANIOTOMY Left 11/10/2021   Procedure: LT FRONTAL CRANIOTOMY TUMOR EXCISION;  Surgeon: Consuella Lose, MD;  Location: [facility];  Service: Neurosurgery;  Laterality: Left;   DILATION AND CURETTAGE OF UTERUS     HEMORRHOID SURGERY     IR IMAGING GUIDED PORT INSERTION  06/15/2020   LIPOSUCTION WITH LIPOFILLING Bilateral 09/22/2021   Procedure: Fat grafting bilateral breast;  Surgeon: [name], MD;  Location: [facility];  Service: Plastics;  Laterality: Bilateral;   MASS EXCISION Bilateral 04/25/2021   Procedure: excision of bilateral axillary breast tissue;  Surgeon: [name], MD;  Location: [facility];  Service: Plastics;  Laterality: Bilateral;   PORT-A-CATH REMOVAL Right 11/07/2020   Procedure: REMOVAL PORT-A-CATH;  Surgeon: [name], MD;  Location: [facility];  Service: General;  Laterality: Right;   REMOVAL OF BILATERAL TISSUE EXPANDERS WITH PLACEMENT OF BILATERAL BREAST IMPLANTS Bilateral 04/25/2021   Procedure: REMOVAL OF BILATERAL TISSUE EXPANDERS WITH PLACEMENT OF BILATERAL BREAST IMPLANTS;  Surgeon: [name], MD;  Location: [facility];  Service: Plastics;  Laterality: Bilateral;   ROBOTIC ASSISTED TOTAL HYSTERECTOMY WITH BILATERAL SALPINGO OOPHERECTOMY Bilateral 03/14/2021   Procedure: XI ROBOTIC ASSISTED TOTAL HYSTERECTOMY WITH BILATERAL SALPINGO OOPHORECTOMY;  Surgeon: Everitt Amber, MD;  Location: [facility];  Service: Gynecology;  Laterality: Bilateral;   TOTAL MASTECTOMY Bilateral 11/07/2020   Procedure: BILATERAL MASTECTOMY;  Surgeon: [name], MD;  Location: [facility];  Service: General;  Laterality: Bilateral;   Family History  Problem Relation Age of Onset   Hypertension Mother    Diabetes Mother    Cancer Mother 1       unknown primary   Other Mother        brain tumor; dx 52s   Non-Hodgkin's lymphoma Brother 49   Other Maternal Uncle 67       brain tumor   Breast cancer Other        MGM's niece; dx late  75s   Pancreatic cancer Neg Hx    Colon cancer Neg Hx    Endometrial cancer Neg Hx    Prostate cancer Neg Hx    Ovarian cancer Neg Hx    Social History:  reports that she quit smoking about 14 months ago. Her smoking use included cigarettes. She has a 5.00 pack-year smoking history. She has never used smokeless tobacco. She reports that she does not currently use alcohol. She reports current drug use. Drug: Marijuana. Allergies:  Allergies  Allergen Reactions   Carboplatin Hives and Itching    Carboplatin reaction after dose #5 requiring Benadryl, Solumedrol, epinephrine, Claritin.   Other Hives    Baking Soda   Wound Dressing Adhesive Hives    Rips skin, prefers paper tape   Medications Prior to Admission  Medication Sig Dispense Refill   acetaminophen (TYLENOL) 500 MG tablet Take 1,000 mg by mouth every 6 (six) hours as needed for headache.     aspirin-acetaminophen-caffeine (EXCEDRIN MIGRAINE) 250-250-65 MG tablet Take 2 tablets by mouth every 6 (six) hours as needed for headache.     Multiple Vitamins-Minerals (ONE-A-DAY WOMENS PO) Take 1 tablet by mouth daily.     omeprazole (PRILOSEC) 20 MG capsule Take 40 mg by mouth daily as needed.     Phenylephrine-APAP-guaiFENesin (VICKS SINEX SEVERE PO) Take 2 capsules by mouth daily as needed (headache).  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 Reports has been using BSC in room at night. Reports difficulty wiping after toileting since breast sx.  Functional Status:  Mobility: Bed Mobility Overal bed mobility: Needs Assistance Bed Mobility: Supine to Sit Rolling: Supervision (toward rt) Supine to sit: Min assist, +2 for physical assistance Sit to supine: Max assist, +2 for physical assistance General bed mobility comments: light MIN A to elevate trunk; able to use hook method to advance BLEs to EOB Transfers Overall transfer level: Needs assistance Equipment used: Ambulation equipment used Transfers: Sit to/from Stand, Bed to chair/wheelchair/BSC Sit to Stand: Min assist, +2 physical assistance, +2 safety/equipment Bed to/from chair/wheelchair/BSC transfer type:: Via Lift equipment, Lateral/scoot transfer  Lateral/Scoot Transfers: +2 physical assistance, Min assist Transfer via Lift Equipment: [employer] transfer comment: +2 min to bring hips up from EOB with use of stedy; Min guard to rise from seat of stedy. +2 min assist to perform lateral scoots up/down EOB with assist using pad to move hips. Ambulation/Gait Ambulation/Gait assistance: Supervision Gait Distance (Feet): 300 Feet Assistive device: None Gait Pattern/deviations: Step-through pattern, Decreased stride length General Gait Details: unable Gait velocity: WFL Gait velocity interpretation: >2.62 ft/sec, indicative of community ambulatory Pre-gait activities: Stood in [place] x 2 for 2 minutes on first with working on trying to activate RLE. Manual facilitation but no active movement detected    ADL: ADL Overall ADL's : Needs assistance/impaired Eating/Feeding: Minimal assistance, Sitting Grooming: Minimal assistance, Sitting Upper Body Bathing: Moderate assistance, Sitting Lower Body Bathing: Total assistance, +2 for physical assistance, +2 for safety/equipment, Sit to/from stand Upper Body Dressing : Minimal assistance,  Bed level Upper Body Dressing Details (indicate cue type and reason): to don gown, good carryover of hemi technique Lower Body Dressing: Maximal assistance, +2 for physical assistance, +2 for safety/equipment, Sit to/from stand Toilet Transfer: Minimal assistance, +2 for safety/equipment, +2 for physical assistance Toilet Transfer Details (indicate cue type and reason): utilized stedy for transfer to chair but abel to sit>stand to stedy from EOB with MIN A +2; min guard to rise from seat of stedy Toileting- Clothing Manipulation and Hygiene: Maximal assistance, +2 for physical assistance, +2 for safety/equipment, Sit to/from stand Functional mobility during ADLs: Minimal assistance, +2 for safety/equipment, +2 for physical assistance (sit>stand from EOB with stedy) General ADL Comments: emphasis on progressing functional mobility, pregait activities, RUE AAROM  Cognition: Cognition Overall Cognitive Status: Within Functional Limits for tasks assessed Orientation Level: Oriented X4 Cognition Arousal/Alertness: Awake/alert Behavior During Therapy: WFL for tasks assessed/performed Overall Cognitive Status: Within Functional Limits for tasks assessed Area of Impairment: Following commands, Awareness, Problem solving, Safety/judgement Current Attention Level: Selective Following Commands: Follows one step commands consistently Safety/Judgement: Decreased awareness of deficits Awareness: Emergent Problem Solving: Slow processing General Comments: slow of speech and command following  Physical Exam: Blood pressure (!) 156/95, pulse 75, temperature (!) 97.5 F (36.4 C), temperature source Oral, resp. rate 18, height '5\' 11"'$  (1.803 m), weight (!) 143 kg,  last menstrual period 09/29/2020, SpO2 98 %. Physical Exam Constitutional:      General: She is not in acute distress.    Appearance: She is obese. She is not ill-appearing.  HENT:     Head:     Comments: Central head Incision healing without  signs of infection. Oral cavity: edentulous    Nose: Nose normal.     Mouth/Throat:     Mouth: Mucous membranes are moist.  Eyes:     Extraocular Movements: Extraocular movements intact.     Pupils: Pupils are equal, round, and reactive to light.  Cardiovascular:     Rate and Rhythm: Normal rate and regular rhythm.     Heart sounds: No murmur heard.   No gallop.  Pulmonary:     Effort: Pulmonary effort is normal. No respiratory distress.     Breath sounds: Normal breath sounds. No wheezing.  Abdominal:     General: Bowel sounds are normal. There is no distension.     Palpations: Abdomen is soft.     Tenderness: There is no abdominal tenderness.  Musculoskeletal:        General: No swelling.     Comments: Right heel cord is tight  Neurological:     Mental Status: She is alert.     Motor: Weakness present.     Comments: Alert and oriented x 3. Normal insight and awareness. Intact Memory. Normal language and speech. Cranial nerve exam unremarkable for mild right central 7. RUE 0/5 deltoids, 2+ to 3/5 biceps, triceps  and 4-5/5 wrist and fingers. RLE: 1/5 HF/HAB and 0/5 elsewhere. DTR's 1+. No resting tone. Sensed pain and light touch in all 4 limbs.   Psychiatric:        Mood and Affect: Mood normal.        Behavior: Behavior normal.     Comments: Very pleasant and outgoing lady!    No results found for this or any previous visit (from the past 48 hour(s)). No results found.    Blood pressure (!) 156/95, pulse 75, temperature (!) 97.5 F (36.4 C), temperature source Oral, resp. rate 18, height '5\' 11"'$  (1.803 m), weight (!) 143 kg, last menstrual period 09/29/2020, SpO2 98 %.  Medical Problem List and Plan: 1. Functional deficits secondary to left posterior frontal lesion resection with right-sided weakness  -patient may shower  -ELOS/Goals: 14-20 days, min assist goals with PT/OT and mod I with SLP  -keep RUE supported given proximal muscle weakness.   -PRAFO to help  support/stretch right heel cord. Consider night splint 2.  Antithrombotics: -DVT/anticoagulation:  Mechanical:  Antiembolism stockings, knee (TED hose) Bilateral lower extremities Sequential compression devices, below knee Bilateral lower extremities  -NS is ok with starting lovenox '30mg'$  q12  -check dopplers  -antiplatelet therapy: none 3. Pain Management: Tylenol, Norco as needed 4. Mood: LCSW to evaluate and provide emotional support  -antipsychotic agents: n/a 5. Neuropsych: This patient is capable of making decisions on her own behalf. 6. Skin/Wound Care: Routine skin care checks  -- monitor surgical incision 7. Fluids/Electrolytes/Nutrition: Routine Is and Os and follow-up chemistries 8: Recurrent metastatic breast cancer with brain lesion s/p resection  --continue Decadron 4 mg q 6 hours  --continue Keppra 500 mg q 12 hours 9: GERD: continue Protonix 10: Constipation: getting Miralax daily and Dulcolax supp PRN  --consider milk of magnesia/Fleets if needed  -last bm 5/24 11: Sensitivity to [employer] laundry products; is not allergic to baking powder 12: Intermittent elevated BP:  continue to monitor --continue clonidine 0.1 mg BID. Consider TID dosing more consistent control 13: Morbid obesity: BMI 43.97; dietary counseling     Barbie Banner, PA-C 11/17/2021

## 2021-11-17 NOTE — Discharge Summary (Signed)
Physician Discharge Summary  Patient ID: KERSTON LANDECK MRN: [id number] DOB/AGE: 10-02-1975 46 y.o.  Admit date: 11/07/2021 Discharge date: 11/17/2021  Admission Diagnoses: Metastatic Breast CA   Discharge Diagnoses:  Same Principal Problem:   Brain mass Active Problems:   Malignant neoplasm of upper-outer quadrant of left breast in female, estrogen receptor positive ([place])   Morbid obesity with BMI of 40.0-44.9, adult ([facility])   Right sided weakness   Brain tumor ([place])   Cerebral edema ([place])   Complex partial seizure ([place])   Discharged Condition: Stable  Hospital Course:  Sheryl Mcmillan is a 46 y.o. female Initially presented to the hospital with episodes of primarily right arm weakness.  Her workup included CT scan and MRI demonstrating a paramedian posterior left frontal lesion.  Patient has a history of breast cancer and with her imaging findings was presumed to have a brain metastasis.  She underwent stereotactic radio surgery followed by left frontal craniotomy for resection of the tumor.  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  She was monitored initially in the intensive care unit followed by the general neuroscience floor with slow but progressive improvement in her right upper extremity strength.  She was seen by Physical and Occupational therapy and noted to be a good candidate for a comprehensive inpatient rehabilitation.  She was therefore discharged to CIR in stable condition.  Treatments: Surgery -   Stereotactic left frontal craniotomy for resection of tumor  Discharge Exam: Blood pressure (!) 159/86, pulse 60, temperature 97.8 F (36.6 C), resp. rate 18, height '5\' 11"'$  (1.803 m), weight (!) 143 kg, last menstrual period 09/29/2020, SpO2 99 %. Awake, alert, oriented Speech fluent, appropriate CN grossly intact 5/5 BUE/BLE Wound c/d/i  Disposition: Discharge  disposition: 02-Transferred to [facility]:   can discontinue staples in 7-10 days  Discharge Instructions     Call MD for:  redness, tenderness, or signs of infection (pain, swelling, redness, odor or green/yellow discharge around incision site)   Complete by: As directed    Call MD for:  temperature >100.4   Complete by: As directed    Diet - low sodium heart healthy   Complete by: As directed    Discharge instructions   Complete by: As directed    Walk at home as much as possible, at least 4 times / day   Discharge wound care:   Complete by: As directed    Discharge Instructions  No restriction in activities, slowly increase your activity back to normal.   Your incision is closed with staples. We will remove these in clinic in 2 weeks at your follow up appointment.   Okay to shower on the day of discharge. Use regular soap and water and try to be gentle when cleaning your incision. No need for a dressing on your incision. In the first few days after surgery, there may be some bloody drainage from your wound. If this happens, you can cover your incision with a gauze dressing to prevent it from staining your clothes or bed linens. If your incision begins to itch, rub some bacitracin or neosporin ointment on it instead of scratching it.  Follow up with Dr. Kathyrn Sheriff in 2 weeks after discharge. If you do not already have a discharge appointment, please call his office at [phone] to schedule a follow up appointment. If you have any concerns or  questions, please call the office and let us know.   Discharge wound care:   Complete by: As directed    Can remove wound staples in 7-10 days.   Increase activity slowly   Complete by: As directed    Increase activity slowly   Complete by: As directed    Lifting restrictions   Complete by: As directed    No lifting > 10 lbs   May shower / Bathe   Complete by: As directed    48 hours after surgery   May walk  up steps   Complete by: As directed    Other Restrictions   Complete by: As directed    No bending/twisting at waist      Allergies as of 11/17/2021       Reactions   Carboplatin Hives, Itching   Carboplatin reaction after dose #5 requiring Benadryl, Solumedrol, epinephrine, Claritin.   Other Hives   Baking Soda   Wound Dressing Adhesive Hives   Rips skin, prefers paper tape        Medication List     TAKE these medications    acetaminophen 500 MG tablet Commonly known as: TYLENOL Take 1,000 mg by mouth every 6 (six) hours as needed for headache.   aspirin-acetaminophen-caffeine 250-250-65 MG tablet Commonly known as: EXCEDRIN MIGRAINE Take 2 tablets by mouth every 6 (six) hours as needed for headache.   dexamethasone 4 MG tablet Commonly known as: DECADRON Take 0.5 tablets (2 mg total) by mouth 2 (two) times daily.   omeprazole 20 MG capsule Commonly known as: PRILOSEC Take 40 mg by mouth daily as needed.   ONE-A-DAY WOMENS PO Take 1 tablet by mouth daily.   VICKS SINEX SEVERE PO Take 2 capsules by mouth daily as needed (headache).               Discharge Care Instructions  (From admission, onward)           Start     Ordered   11/17/21 0000  Discharge wound care:       Comments: Discharge Instructions  No restriction in activities, slowly increase your activity back to normal.   Your incision is closed with staples. We will remove these in clinic in 2 weeks at your follow up appointment.   Okay to shower on the day of discharge. Use regular soap and water and try to be gentle when cleaning your incision. No need for a dressing on your incision. In the first few days after surgery, there may be some bloody drainage from your wound. If this happens, you can cover your incision with a gauze dressing to prevent it from staining your clothes or bed linens. If your incision begins to itch, rub some bacitracin or neosporin ointment on it instead of  scratching it.  Follow up with Dr. Kathyrn Sheriff in 2 weeks after discharge. If you do not already have a discharge appointment, please call his office at [phone] to schedule a follow up appointment. If you have any concerns or questions, please call the office and let us know.   11/17/21 1442   11/17/21 0000  Discharge wound care:       Comments: Can remove wound staples in 7-10 days.   11/17/21 1532            Follow-up Information     Consuella Lose, MD Follow up.   Specialty: Neurosurgery Contact information: [address] [place] [address] [phone]  SignedJairo Ben 11/17/2021, 3:32 PM

## 2021-11-17 NOTE — H&P (Addendum)
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  She primarily complained of poor dexterity of her right hand.  Her past medical history is significant for triple negative breast cancer status postchemotherapy, surgery and immunotherapy.  Imaging revealed a solitary left frontal metastasis which appeared to be anterior to the precentral gyrus within the supplementary motor area.  There was also significant associated edema.  Neurosurgery, Dr. Kathyrn Sheriff was consulted.  She was taken to the operating room on 5/19 and underwent stereotactic left frontal craniotomy for resection of the tumor.  Radiation oncology was also consulted for preoperative adjuvant stereotactic radiosurgery.  Surgical pathology was consistent with metastatic poorly differentiated adenocarcinoma breast primary.  Postoperatively, she exhibited right plegia greatest in the right lower extremity. Dr. Chryl Heck, her medical oncologist was also consulted and saw her on 5/23.  CT scans revealed no signs of metastatic disease in the chest, abdomen or pelvis. 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   Past surgical history includes bilateral mastectomies, hysterectomy with bilateral salpingo-oophorectomy and cholecystectomy.   No history of diabetes mellitus, coronary disease, lung or kidney disease.  Quit cigarette smoking in March last year. Smokes marijuana. Rare alcohol use. Daughter, [name], lives with her.   Review of Systems  Constitutional:  Positive for fever. Negative for chills.   HENT:  Negative for hearing loss and sore throat.   Eyes:  Negative for blurred vision and double vision.  Respiratory:  Negative for cough and sputum production.   Cardiovascular:  Negative for chest pain and palpitations.  Gastrointestinal:  Positive for constipation. Negative for abdominal pain, nausea and vomiting.  Genitourinary:  Negative for dysuria and urgency.       Using Purewick  Musculoskeletal:        Right shoulder fatigue/soreness>>not painful  Skin:  Negative for itching.       Has cherry red blush to upper front chest that is normal for her  Neurological:  Positive for focal weakness. Negative for dizziness.      Past Medical History:  Diagnosis Date   Arthritis     Breast cancer ([place])     Family history of non-Hodgkin's lymphoma 06/01/2020   GERD (gastroesophageal reflux disease)     Hemorrhoids     Migraines     PCOS (polycystic ovarian syndrome)     PONV (postoperative nausea and vomiting)           Past Surgical History:  Procedure Laterality Date   APPLICATION OF CRANIAL NAVIGATION Left 11/10/2021    Procedure: APPLICATION OF CRANIAL NAVIGATION;  Surgeon: Consuella Lose, MD;  Location: [facility];  Service: Neurosurgery;  Laterality: Left;   AXILLARY SENTINEL NODE BIOPSY Left 11/07/2020    Procedure: LEFT AXILLARY SENTINEL NODE BIOPSY;  Surgeon: [name], MD;  Location: [facility];  Service: General;  Laterality: Left;   BREAST CYST EXCISION Right 09/22/2021    Procedure: Excision of right axillary excess soft tissue.;  Surgeon: [name], MD;  Location: [facility];  Service: Plastics;  Laterality: Right;   BREAST RECONSTRUCTION WITH PLACEMENT OF TISSUE EXPANDER AND FLEX HD (ACELLULAR HYDRATED  DERMIS) Bilateral 11/07/2020    Procedure: BILATERAL BREAST RECONSTRUCTION WITH PLACEMENT OF TISSUE EXPANDER AND FLEX HD (ACELLULAR HYDRATED DERMIS);  Surgeon: [name], MD;  Location: [facility];  Service: Plastics;  Laterality: Bilateral;    COSMETIC SURGERY       CRANIOTOMY Left 11/10/2021    Procedure: LT FRONTAL CRANIOTOMY TUMOR EXCISION;  Surgeon: Consuella Lose, MD;  Location: [facility];  Service: Neurosurgery;  Laterality: Left;   DILATION AND CURETTAGE OF UTERUS       HEMORRHOID SURGERY       IR IMAGING GUIDED PORT INSERTION   06/15/2020   LIPOSUCTION WITH LIPOFILLING Bilateral 09/22/2021    Procedure: Fat grafting bilateral breast;  Surgeon: [name], MD;  Location: [facility];  Service: Plastics;  Laterality: Bilateral;   MASS EXCISION Bilateral 04/25/2021    Procedure: excision of bilateral axillary breast tissue;  Surgeon: [name], MD;  Location: [facility];  Service: Plastics;  Laterality: Bilateral;   PORT-A-CATH REMOVAL Right 11/07/2020    Procedure: REMOVAL PORT-A-CATH;  Surgeon: [name], MD;  Location: [facility];  Service: General;  Laterality: Right;   REMOVAL OF BILATERAL TISSUE EXPANDERS WITH PLACEMENT OF BILATERAL BREAST IMPLANTS Bilateral 04/25/2021    Procedure: REMOVAL OF BILATERAL TISSUE EXPANDERS WITH PLACEMENT OF BILATERAL BREAST IMPLANTS;  Surgeon: [name], MD;  Location: [facility];  Service: Plastics;  Laterality: Bilateral;   ROBOTIC ASSISTED TOTAL HYSTERECTOMY WITH BILATERAL SALPINGO OOPHERECTOMY Bilateral 03/14/2021    Procedure: XI ROBOTIC ASSISTED TOTAL HYSTERECTOMY WITH BILATERAL SALPINGO OOPHORECTOMY;  Surgeon: Everitt Amber, MD;  Location: [facility];  Service: Gynecology;  Laterality: Bilateral;   TOTAL MASTECTOMY Bilateral 11/07/2020    Procedure: BILATERAL MASTECTOMY;  Surgeon: [name], MD;  Location: [facility];  Service: General;  Laterality: Bilateral;         Family History  Problem Relation Age of Onset   Hypertension Mother     Diabetes Mother     Cancer Mother 41        unknown primary   Other Mother          brain tumor; dx 34s   Non-Hodgkin's lymphoma Brother 24   Other Maternal Uncle 66        brain tumor    Breast cancer Other          MGM's niece; dx late 67s   Pancreatic cancer Neg Hx     Colon cancer Neg Hx     Endometrial cancer Neg Hx     Prostate cancer Neg Hx     Ovarian cancer Neg Hx      Social History:  reports that she quit smoking about 14 months ago. Her smoking use included cigarettes. She has a 5.00 pack-year smoking history. She has never used smokeless tobacco. She reports that she does not currently use alcohol. She reports current drug use. Drug: Marijuana. Allergies:       Allergies  Allergen Reactions   Carboplatin Hives and Itching      Carboplatin reaction after dose #5 requiring Benadryl, Solumedrol, epinephrine, Claritin.   Other Hives      Baking Soda   Wound Dressing Adhesive Hives      Rips skin, prefers paper tape          Medications Prior to Admission  Medication Sig Dispense Refill   acetaminophen (TYLENOL) 500 MG tablet Take 1,000 mg by mouth every 6 (six) hours as needed for headache.  aspirin-acetaminophen-caffeine (EXCEDRIN MIGRAINE) 250-250-65 MG tablet Take 2 tablets by mouth every 6 (six) hours as needed for headache.       Multiple Vitamins-Minerals (ONE-A-DAY WOMENS PO) Take 1 tablet by mouth daily.       omeprazole (PRILOSEC) 20 MG capsule Take 40 mg by mouth daily as needed.       Phenylephrine-APAP-guaiFENesin (VICKS SINEX SEVERE PO) Take 2 capsules by mouth daily as needed (headache).              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 Reports has been using BSC in room at night. Reports difficulty wiping after toileting since breast sx.   Functional Status:  Mobility: Bed Mobility Overal bed mobility: Needs Assistance Bed Mobility: Supine to Sit Rolling: Supervision (toward rt) Supine to sit: Min assist, +2 for physical assistance Sit to supine: Max assist, +2 for physical assistance General bed mobility comments: light MIN A to elevate trunk; able to use hook method to advance BLEs to EOB Transfers Overall transfer level: Needs assistance Equipment used: Ambulation equipment used Transfers: Sit to/from Stand, Bed to chair/wheelchair/BSC Sit to Stand: Min assist, +2 physical assistance, +2 safety/equipment Bed to/from chair/wheelchair/BSC transfer type:: Via Lift equipment, Lateral/scoot transfer  Lateral/Scoot Transfers: +2 physical assistance, Min assist Transfer via Lift Equipment: [employer] transfer comment: +2 min to bring hips up from EOB with use of stedy; Min guard to rise from seat of stedy. +2 min assist to perform lateral scoots up/down EOB with assist using pad to move hips. Ambulation/Gait Ambulation/Gait assistance: Supervision Gait Distance (Feet): 300 Feet Assistive device: None Gait Pattern/deviations: Step-through pattern, Decreased stride length General Gait Details: unable Gait velocity: WFL Gait velocity interpretation: >2.62 ft/sec, indicative of community ambulatory Pre-gait activities: Stood in [place] x 2 for 2 minutes on first with working on trying to activate RLE. Manual facilitation but no active movement detected   ADL: ADL Overall ADL's : Needs assistance/impaired Eating/Feeding: Minimal assistance, Sitting Grooming: Minimal assistance, Sitting Upper Body Bathing: Moderate assistance, Sitting Lower Body Bathing: Total assistance, +2 for  physical assistance, +2 for safety/equipment, Sit to/from stand Upper Body Dressing : Minimal assistance, Bed level Upper Body Dressing Details (indicate cue type and reason): to don gown, good carryover of hemi technique Lower Body Dressing: Maximal assistance, +2 for physical assistance, +2 for safety/equipment, Sit to/from stand Toilet Transfer: Minimal assistance, +2 for safety/equipment, +2 for physical assistance Toilet Transfer Details (indicate cue type and reason): utilized stedy for transfer to chair but abel to sit>stand to stedy from EOB with MIN A +2; min guard to rise from seat of stedy Toileting- Clothing Manipulation and Hygiene: Maximal assistance, +2 for physical assistance, +2 for safety/equipment, Sit to/from stand Functional mobility during ADLs: Minimal assistance, +2 for safety/equipment, +2 for physical assistance (sit>stand from EOB with stedy) General ADL Comments: emphasis on progressing functional mobility, pregait activities, RUE AAROM   Cognition: Cognition Overall Cognitive Status: Within Functional Limits for tasks assessed Orientation Level: Oriented  X4 Cognition Arousal/Alertness: Awake/alert Behavior During Therapy: WFL for tasks assessed/performed Overall Cognitive Status: Within Functional Limits for tasks assessed Area of Impairment: Following commands, Awareness, Problem solving, Safety/judgement Current Attention Level: Selective Following Commands: Follows one step commands consistently Safety/Judgement: Decreased awareness of deficits Awareness: Emergent Problem Solving: Slow processing General Comments: slow of speech and command following   Physical Exam: Blood pressure (!) 156/95, pulse 75, temperature (!) 97.5 F (36.4 C), temperature source Oral, resp. rate 18, height '5\' 11"'$  (1.803 m), weight (!) 143 kg, last menstrual period 09/29/2020, SpO2 98 %. Physical Exam Constitutional:      General: She is not in acute distress.    Appearance:  She is obese. She is not ill-appearing.  HENT:     Head:     Comments: Central head Incision healing without signs of infection. Oral cavity: edentulous    Nose: Nose normal.     Mouth/Throat:     Mouth: Mucous membranes are moist.  Eyes:     Extraocular Movements: Extraocular movements intact.     Pupils: Pupils are equal, round, and reactive to light.  Cardiovascular:     Rate and Rhythm: Normal rate and regular rhythm.     Heart sounds: No murmur heard.   No gallop.  Pulmonary:     Effort: Pulmonary effort is normal. No respiratory distress.     Breath sounds: Normal breath sounds. No wheezing.  Abdominal:     General: Bowel sounds are normal. There is no distension.     Palpations: Abdomen is soft.     Tenderness: There is no abdominal tenderness.  Musculoskeletal:        General: No swelling.     Comments: Right heel cord is tight  Neurological:     Mental Status: She is alert.     Motor: Weakness present.     Comments: Alert and oriented x 3. Normal insight and awareness. Intact Memory. Normal language and speech. Cranial nerve exam unremarkable for mild right central 7. RUE 0/5 deltoids, 2+ to 3/5 biceps, triceps  and 4-5/5 wrist and fingers. RLE: 1/5 HF/HAB and 0/5 elsewhere. DTR's 1+. No resting tone. Sensed pain and light touch in all 4 limbs.   Psychiatric:        Mood and Affect: Mood normal.        Behavior: Behavior normal.     Comments: Very pleasant and outgoing lady!      Lab Results Last 48 Hours  No results found for this or any previous visit (from the past 48 hour(s)).   Imaging Results (Last 48 hours)  No results found.         Blood pressure (!) 156/95, pulse 75, temperature (!) 97.5 F (36.4 C), temperature source Oral, resp. rate 18, height '5\' 11"'$  (1.803 m), weight (!) 143 kg, last menstrual period 09/29/2020, SpO2 98 %.   Medical Problem List and Plan: 1. Functional deficits secondary to left posterior frontal lobe metastatic breast cancer,  s/p resection with right-sided weakness             -patient may shower             -ELOS/Goals: 14-20 days, min assist goals with PT/OT and mod I with SLP             -keep RUE supported given proximal muscle weakness.              -PRAFO to help support/stretch right heel cord. Consider night splint 2.  Antithrombotics: -DVT/anticoagulation:  Mechanical:  Antiembolism stockings, knee (TED hose) Bilateral lower extremities Sequential compression devices, below knee Bilateral lower extremities             -NS is ok with starting lovenox '30mg'$  q12--begin today             -check dopplers             -antiplatelet therapy: none 3. Pain Management: Tylenol, Norco as needed 4. Mood: LCSW to evaluate and provide emotional support             -antipsychotic agents: n/a 5. Neuropsych: This patient is capable of making decisions on her own behalf. 6. Skin/Wound Care: Routine skin care checks             -- monitor surgical incision 7. Fluids/Electrolytes/Nutrition: Routine Is and Os and follow-up chemistries 8: Recurrent metastatic breast cancer with brain lesion s/p resection             --continue Decadron 4 mg q 6 hours             --continue Keppra 500 mg q 12 hours for seizure prophylaxis 9: GERD: continue Protonix 10: Constipation: getting Miralax daily and Dulcolax supp PRN             --consider milk of magnesia/Fleets if needed             -last bm 5/24 11: Sensitivity to [employer] laundry products; is not allergic to baking powder 12: Intermittent elevated BP: continue to monitor --continue clonidine 0.1 mg BID. Consider TID dosing more consistent control 13: Morbid obesity: BMI 43.97; dietary counseling       Barbie Banner, PA-C 11/17/2021   I have personally performed a face to face diagnostic evaluation of this patient and formulated the key components of the plan.  Additionally, I have personally reviewed laboratory data, imaging studies, as well as relevant notes and  concur with the physician assistant's documentation above.  The patient's status has not changed from the original H&P.  Any changes in documentation from the acute care chart have been noted above.  Meredith Staggers, MD, [facility]

## 2021-11-17 NOTE — Progress Notes (Signed)
Inpatient Rehab Admissions Coordinator:    I continue to await insurance auth for CIR admission. I can offer her a bed once I receive authorization.  Clemens Catholic, [place], [place] Admissions Coordinator  [phone] ([place]) [phone] (office)

## 2021-11-17 NOTE — Progress Notes (Signed)
Physical Therapy Treatment Patient Details Name: Sheryl Mcmillan MRN: [id number] DOB: [birth year] Today's Date: 11/17/2021   History of Present Illness Pt is a 46 y.o. female who presented to the ED 5/16 with R side weakness.  MRI revealed partially solid contrast-enhancing mass in the superior left hemisphere with large area of vasogenic edema, most consistent with  metastatic disease. Now s/p L frontal crani tumor excision 5/19. PMH: left breast cancer (ER negative, PR positive, BRCA1 positive) s/p neoadjuvant chemotherapy, bilateral mastectomy, hysterectomy and bilateral SOO    PT Comments    Pt continues to make excellent improvement and was able to don socks unassisted and transfer to/from recliner without the use of the [place]. Pt continues to be highly motivated to work toward return to independence. Continue to feel she will be excellent candidate for acute inpatient rehab.    Recommendations for follow up therapy are one component of a multi-disciplinary discharge planning process, led by the attending physician.  Recommendations may be updated based on patient status, additional functional criteria and insurance authorization.  Follow Up Recommendations  Acute inpatient rehab (3hours/day)     Assistance Recommended at Discharge Frequent or constant Supervision/Assistance  Patient can return home with the following Two people to help with walking and/or transfers;Two people to help with bathing/dressing/bathroom;Assistance with cooking/housework;Direct supervision/assist for medications management;Assist for transportation;Help with stairs or ramp for entrance;Direct supervision/assist for financial management   Equipment Recommendations  Wheelchair (measurements PT);Wheelchair cushion (measurements PT)    Recommendations for Other Services       Precautions / Restrictions Precautions Precautions: Fall Restrictions Weight Bearing Restrictions: No     Mobility  Bed  Mobility Overal bed mobility: Needs Assistance Bed Mobility: Supine to Sit     Supine to sit: Min assist, +2 for physical assistance     General bed mobility comments: Assist to bring RLE off of bed and elevate trunk into sitting    Transfers Overall transfer level: Needs assistance Equipment used: Ambulation equipment used Transfers: Sit to/from Stand, Bed to chair/wheelchair/BSC Sit to Stand: Min assist, +2 physical assistance, +2 safety/equipment          Lateral/Scoot Transfers: Min assist, +2 safety/equipment General transfer comment: Min assist and +1 for safety to come to stand from EOB using Stedy. +2 min assist to bring hips up when standing from lower recliner with Stedy. Performed chair to bed with scoot transfer toward lt and min assist with +2 for safety. Performed bed to chair scoot transfer toward rt with +2 min assist. Transfer via Lift Equipment: Stedy  Ambulation/Gait             Pre-gait activities: Stood in [place] x 2 minutes x 2 working on [employer] bearing through RLE and trying to activate any active movement. No RLE active movement noted but pt able to get rt knee into extension by shifting center of gravity forward.     Stairs             Wheelchair Mobility    Modified Rankin (Stroke Patients Only)       Balance Overall balance assessment: Needs assistance Sitting-balance support: Feet supported Sitting balance-Leahy Scale: Fair     Standing balance support: Bilateral upper extremity supported Standing balance-Leahy Scale: Poor Standing balance comment: able to stand in stedy for ~ 2 mins with min guard for standing balance with BUEs supported  Cognition Arousal/Alertness: Awake/alert Behavior During Therapy: WFL for tasks assessed/performed Overall Cognitive Status: Within Functional Limits for tasks assessed                                          Exercises       General Comments        Pertinent Vitals/Pain Pain Assessment Pain Assessment: No/denies pain    Home Living                          Prior Function            PT Goals (current goals can now be found in the care plan section) Acute Rehab PT Goals Patient Stated Goal: To walk out of here PT Goal Formulation: With patient Time For Goal Achievement: 11/24/21 Potential to Achieve Goals: Good Progress towards PT goals: Goals met and updated - see care plan    Frequency    Min 4X/week      PT Plan Current plan remains appropriate    Co-evaluation              AM-PAC PT "6 Clicks" Mobility   Outcome Measure  Help needed turning from your back to your side while in a flat bed without using bedrails?: A Lot Help needed moving from lying on your back to sitting on the side of a flat bed without using bedrails?: A Little Help needed moving to and from a bed to a chair (including a wheelchair)?: Total Help needed standing up from a chair using your arms (e.g., wheelchair or bedside chair)?: Total Help needed to walk in hospital room?: Total Help needed climbing 3-5 steps with a railing? : Total 6 Click Score: 9    End of Session Equipment Utilized During Treatment: Gait belt Activity Tolerance: Patient tolerated treatment well Patient left: in chair;with call bell/phone within reach;with family/visitor present;with chair alarm set Nurse Communication: Mobility status;Need for lift equipment PT Visit Diagnosis: Difficulty in walking, not elsewhere classified (R26.2);Hemiplegia and hemiparesis Hemiplegia - Right/Left: Right Hemiplegia - dominant/non-dominant: Dominant Hemiplegia - caused by: Unspecified     Time: 1030-1058 PT Time Calculation (min) (ACUTE ONLY): 28 min  Charges:  $Therapeutic Activity: 23-37 mins                     [place] Office [place] 11/17/2021, 1:38 PM

## 2021-11-17 NOTE — Progress Notes (Signed)
  NEUROSURGERY PROGRESS NOTE   No issues overnight. Pt with no new complaints.  EXAM:  BP (!) 156/95 (BP Location: Left Leg)   Pulse 75   Temp (!) 97.5 F (36.4 C) (Oral)   Resp 18   Ht '5\' 11"'$  (1.803 m)   Wt (!) 143 kg   LMP 09/29/2020 (Approximate)   SpO2 98%   BMI 43.97 kg/m   Awake, alert, oriented  Speech fluent, appropriate  CN grossly intact  5/5 LUE/LLE 3-4/5 right finger flex/ext and grip, minimal proximal movement ? Flicker right hip flexion, no distal movement  IMPRESSION:  46 y.o. female POD# 7 s/p resection of left posterior frontal lesion with slowly improving dense SMA syndrome.  PLAN: - Cont PT/OT - Stable for CIR when insurance approved/bed available  Consuella Lose, MD [facility]

## 2021-11-18 LAB — CBC WITH DIFFERENTIAL/PLATELET
Abs Immature Granulocytes: 0.45 10*3/uL — ABNORMAL HIGH (ref 0.00–0.07)
Basophils Absolute: 0.1 10*3/uL (ref 0.0–0.1)
Basophils Relative: 0 %
Eosinophils Absolute: 0 10*3/uL (ref 0.0–0.5)
Eosinophils Relative: 0 %
HCT: 41 % (ref 36.0–46.0)
Hemoglobin: 13.8 g/dL (ref 12.0–15.0)
Immature Granulocytes: 3 %
Lymphocytes Relative: 12 %
Lymphs Abs: 1.9 10*3/uL (ref 0.7–4.0)
MCH: 28 pg (ref 26.0–34.0)
MCHC: 33.7 g/dL (ref 30.0–36.0)
MCV: 83.2 fL (ref 80.0–100.0)
Monocytes Absolute: 0.8 10*3/uL (ref 0.1–1.0)
Monocytes Relative: 5 %
Neutro Abs: 12.8 10*3/uL — ABNORMAL HIGH (ref 1.7–7.7)
Neutrophils Relative %: 80 %
Platelets: 261 10*3/uL (ref 150–400)
RBC: 4.93 MIL/uL (ref 3.87–5.11)
RDW: 13.8 % (ref 11.5–15.5)
WBC: 15.9 10*3/uL — ABNORMAL HIGH (ref 4.0–10.5)
nRBC: 0 % (ref 0.0–0.2)

## 2021-11-18 LAB — COMPREHENSIVE METABOLIC PANEL
ALT: 69 U/L — ABNORMAL HIGH (ref 0–44)
AST: 29 U/L (ref 15–41)
Albumin: 3 g/dL — ABNORMAL LOW (ref 3.5–5.0)
Alkaline Phosphatase: 69 U/L (ref 38–126)
Anion gap: 9 (ref 5–15)
BUN: 17 mg/dL (ref 6–20)
CO2: 24 mmol/L (ref 22–32)
Calcium: 8.9 mg/dL (ref 8.9–10.3)
Chloride: 103 mmol/L (ref 98–111)
Creatinine, Ser: 0.63 mg/dL (ref 0.44–1.00)
GFR, Estimated: >60 mL/min (ref >60)
Glucose, Bld: 206 mg/dL — ABNORMAL HIGH (ref 70–99)
Potassium: 4.2 mmol/L (ref 3.5–5.1)
Sodium: 136 mmol/L (ref 135–145)
Total Bilirubin: 0.5 mg/dL (ref 0.3–1.2)
Total Protein: 6 g/dL — ABNORMAL LOW (ref 6.5–8.1)

## 2021-11-18 MED ORDER — SORBITOL 70 % SOLN
60.0000 mL | Freq: Once | Status: AC
  Administered 2021-11-18: 60 mL via ORAL
  Filled 2021-11-18: qty 60

## 2021-11-18 MED ORDER — MELATONIN 5 MG PO TABS
5.0000 mg | ORAL_TABLET | Freq: Every day | ORAL | Status: DC
  Administered 2021-11-18: 5 mg via ORAL
  Filled 2021-11-18: qty 1

## 2021-11-18 NOTE — Progress Notes (Signed)
PROGRESS NOTE   Subjective/Complaints: Abd feels tight due to lack of BM since 5/16! Slept not great due to new location- doesn't want sleeping meds but willing to try melatonin.   SLP eval went great- doesn't need SLP.  Bedpan doesn't work to [employer].  Wondering is can use BSC  ROS:  Pt denies SOB, abd pain, CP, N/V/ (+)C/D, and vision changes   Objective:   No results found. Recent Labs    11/18/21 0526  WBC 15.9*  HGB 13.8  HCT 41.0  PLT 261   Recent Labs    11/18/21 0526  NA 136  K 4.2  CL 103  CO2 24  GLUCOSE 206*  BUN 17  CREATININE 0.63  CALCIUM 8.9    Intake/Output Summary (Last 24 hours) at 11/18/2021 0919 Last data filed at 11/18/2021 0544 Gross per 24 hour  Intake 240 ml  Output 600 ml  Net -360 ml        Physical Exam: Vital Signs Blood pressure (!) 151/99, pulse (!) 59, temperature 98.2 F (36.8 C), temperature source Oral, resp. rate 18, height '5\' 11"'$  (1.803 m), weight 131 kg, last menstrual period 09/29/2020, SpO2 99 %.    General: awake, alert, appropriate, sitting up in bed; SLP in room; NAD HENT: ; oropharynx moist CV: regular rate; no JVD Pulmonary: CTA B/L; no W/R/R- good air movement GI: firm abdomen; NT; somewhat distended; hypoactive BS Psychiatric: appropriate- pleasant, outgoing-  Neurological: Ox3- very tiny delay in responses- like almost stutter Musculoskeletal:        General: No swelling.     Comments: Right heel cord is tight  Neurological:     Mental Status: She is alert.     Motor: Weakness present.     Comments: Alert and oriented x 3. Normal insight and awareness. Intact Memory. Normal language and speech. Cranial nerve exam unremarkable for mild right central 7. RUE 0/5 deltoids, 2+ to 3/5 biceps, triceps  and 4-5/5 wrist and fingers. RLE: 1/5 HF/HAB and 0/5 elsewhere. DTR's 1+. No resting tone. Sensed pain and light touch in all 4 limbs.      Assessment/Plan: 1. Functional deficits which require 3+ hours per day of interdisciplinary therapy in a comprehensive inpatient rehab setting. Physiatrist is providing close team supervision and 24 hour management of active medical problems listed below. Physiatrist and rehab team continue to assess barriers to discharge/monitor patient progress toward functional and medical goals  Care Tool:  Bathing              Bathing assist       Upper Body Dressing/Undressing Upper body dressing   What is the patient wearing?: Hospital gown only    Upper body assist Assist Level: Moderate Assistance - Patient 50 - 74%    Lower Body Dressing/Undressing Lower body dressing      What is the patient wearing?: [place] only     Lower body assist       Toileting Toileting    Toileting assist Assist for toileting: Total Assistance - Patient < 25%     Transfers Chair/bed transfer  Transfers assist  Locomotion Ambulation   Ambulation assist              Walk 10 feet activity   Assist           Walk 50 feet activity   Assist           Walk 150 feet activity   Assist           Walk 10 feet on uneven surface  activity   Assist           Wheelchair     Assist               Wheelchair 50 feet with 2 turns activity    Assist            Wheelchair 150 feet activity     Assist          Blood pressure (!) 151/99, pulse (!) 59, temperature 98.2 F (36.8 C), temperature source Oral, resp. rate 18, height '5\' 11"'$  (1.803 m), weight 131 kg, last menstrual period 09/29/2020, SpO2 99 %.    Medical Problem List and Plan: 1. Functional deficits secondary to left posterior frontal lobe metastatic breast cancer, s/p resection with right-sided weakness             -patient may shower             -ELOS/Goals: 14-20 days, min assist goals with PT/OT and mod I with SLP             -keep RUE supported  given proximal muscle weakness.              -PRAFO to help support/stretch right heel cord. Consider night splint  5/27- first day of evaluations- Con't CIR- PT and OT- Did SLP eval but doesn't need Speech therapy 2.  Antithrombotics: -DVT/anticoagulation:  Mechanical:  Antiembolism stockings, knee (TED hose) Bilateral lower extremities Sequential compression devices, below knee Bilateral lower extremities             -NS is ok with starting lovenox '30mg'$  q12--begin today             -check dopplers 5/27- Dopplers pending- on Lovenox 30 mg q12 hours             -antiplatelet therapy: none 3. Pain Management: Tylenol, Norco as needed 4. Mood: LCSW to evaluate and provide emotional support             -antipsychotic agents: n/a 5. Neuropsych: This patient is capable of making decisions on her own behalf. 6. Skin/Wound Care: Routine skin care checks             -- monitor surgical incision 7. Fluids/Electrolytes/Nutrition: Routine Is and Os and follow-up chemistries 8: Recurrent metastatic breast cancer with brain lesion s/p resection             --continue Decadron 4 mg q 6 hours             --continue Keppra 500 mg q 12 hours for seizure prophylaxis 9: GERD: continue Protonix 10: Constipation: getting Miralax daily and Dulcolax supp PRN             --consider milk of magnesia/Fleets if needed             -last bm 5/24  5/27- pt reports last "decent BM" was 5/16- so will give Sorbitol 60cc after therapy. Will see if can use BSC since will have been seen by therapy at that point.  11:  Sensitivity to [employer] laundry products; is not allergic to baking powder 12: Intermittent elevated BP: continue to monitor --continue clonidine 0.1 mg BID. Consider TID dosing more consistent control 13: Morbid obesity: BMI 43.97; dietary counseling  14. Insomnia  5/27- not sleeping well- will try melatonin 5 mg QHS since doesn't want "meds".   15. Leukocytosis  5/27- WBC 15.9- but since last labs,  have new Decadron 4 mg q6 hours- afebrile- no signs/Sx's of illness- will recheck Monday and monitor   I spent a total of 38  minutes on total care today- >50% coordination of care- due to d/w SLP about care and prolonged d/w pt about sleep and constipation- if need be, will do KUB tomorrow if no results.    LOS: 1 days A FACE TO FACE EVALUATION WAS PERFORMED  Sheryl Mcmillan 11/18/2021, 9:19 AM

## 2021-11-18 NOTE — Evaluation (Signed)
Physical Therapy Assessment and Plan  Patient Details  Name: Sheryl Mcmillan MRN: [id number] Date of Birth: 1975/11/01  PT Diagnosis: Difficulty walking and Hemiplegia dominant Rehab Potential: Good ELOS: 14-21 days   Today's Date: 11/18/2021 PT Individual Time: 1761-6073 PT Individual Time Calculation (min): 60 min    Hospital Problem: Principal Problem:   Brain tumor [facility])   Past Medical History:  Past Medical History:  Diagnosis Date   Arthritis    Breast cancer ([place])    Family history of non-Hodgkin's lymphoma 06/01/2020   GERD (gastroesophageal reflux disease)    Hemorrhoids    Migraines    PCOS (polycystic ovarian syndrome)    PONV (postoperative nausea and vomiting)    Past Surgical History:  Past Surgical History:  Procedure Laterality Date   APPLICATION OF CRANIAL NAVIGATION Left 11/10/2021   Procedure: APPLICATION OF CRANIAL NAVIGATION;  Surgeon: Consuella Lose, MD;  Location: [place];  Service: Neurosurgery;  Laterality: Left;   AXILLARY SENTINEL NODE BIOPSY Left 11/07/2020   Procedure: LEFT AXILLARY SENTINEL NODE BIOPSY;  Surgeon: [name], MD;  Location: [place];  Service: General;  Laterality: Left;   BREAST CYST EXCISION Right 09/22/2021   Procedure: Excision of right axillary excess soft tissue.;  Surgeon: [name], MD;  Location: [place];  Service: Plastics;  Laterality: Right;   BREAST RECONSTRUCTION WITH PLACEMENT OF TISSUE EXPANDER AND FLEX HD (ACELLULAR HYDRATED DERMIS) Bilateral 11/07/2020   Procedure: BILATERAL BREAST RECONSTRUCTION WITH PLACEMENT OF TISSUE EXPANDER AND FLEX HD (ACELLULAR HYDRATED DERMIS);  Surgeon: [name], MD;  Location: [place];  Service: Plastics;  Laterality: Bilateral;   COSMETIC SURGERY     CRANIOTOMY Left 11/10/2021   Procedure: LT FRONTAL CRANIOTOMY TUMOR EXCISION;  Surgeon: Consuella Lose, MD;  Location: [place];  Service: Neurosurgery;  Laterality: Left;   DILATION AND CURETTAGE OF UTERUS      HEMORRHOID SURGERY     IR IMAGING GUIDED PORT INSERTION  06/15/2020   LIPOSUCTION WITH LIPOFILLING Bilateral 09/22/2021   Procedure: Fat grafting bilateral breast;  Surgeon: [name], MD;  Location: [place];  Service: Plastics;  Laterality: Bilateral;   MASS EXCISION Bilateral 04/25/2021   Procedure: excision of bilateral axillary breast tissue;  Surgeon: [name], MD;  Location: [place];  Service: Plastics;  Laterality: Bilateral;   PORT-A-CATH REMOVAL Right 11/07/2020   Procedure: REMOVAL PORT-A-CATH;  Surgeon: [name], MD;  Location: [place];  Service: General;  Laterality: Right;   REMOVAL OF BILATERAL TISSUE EXPANDERS WITH PLACEMENT OF BILATERAL BREAST IMPLANTS Bilateral 04/25/2021   Procedure: REMOVAL OF BILATERAL TISSUE EXPANDERS WITH PLACEMENT OF BILATERAL BREAST IMPLANTS;  Surgeon: [name], MD;  Location: [place];  Service: Plastics;  Laterality: Bilateral;   ROBOTIC ASSISTED TOTAL HYSTERECTOMY WITH BILATERAL SALPINGO OOPHERECTOMY Bilateral 03/14/2021   Procedure: XI ROBOTIC ASSISTED TOTAL HYSTERECTOMY WITH BILATERAL SALPINGO OOPHORECTOMY;  Surgeon: Everitt Amber, MD;  Location: [facility];  Service: Gynecology;  Laterality: Bilateral;   TOTAL MASTECTOMY Bilateral 11/07/2020   Procedure: BILATERAL MASTECTOMY;  Surgeon: [name], MD;  Location: [place];  Service: General;  Laterality: Bilateral;    Assessment & Plan Clinical Impression: Patient is a right-handed 46 year old female with a 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  She primarily complained of poor dexterity of her right hand.  Her past medical history is significant  for triple negative breast cancer status postchemotherapy, surgery and immunotherapy.  Imaging revealed a solitary left frontal metastasis which  appeared to be anterior to the precentral gyrus within the supplementary motor area.  There was also significant associated edema.  Neurosurgery, Dr. Kathyrn Sheriff was consulted.  She was taken to the operating room on 5/19 and underwent stereotactic left frontal craniotomy for resection of the tumor.  Radiation oncology was also consulted for preoperative adjuvant stereotactic radiosurgery.  Surgical pathology was consistent with metastatic poorly differentiated adenocarcinoma breast primary.  Postoperatively, she exhibited right plegia greatest in the right lower extremity. Dr. Chryl Heck, her medical oncologist was also consulted and saw her on 5/23.  CT scans revealed no signs of metastatic disease in the chest, abdomen or pelvis. 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   Patient currently requires mod with mobility secondary to  R motor weakness .  Prior to hospitalization, patient was independent  with mobility and lived with Daughter in a [place] home.  Home access is 3Stairs to enter.  Patient will benefit from skilled PT intervention to maximize safe functional mobility, minimize fall risk, and decrease caregiver burden for planned discharge home with 24 hour assist.  Anticipate patient will benefit from follow up [facility] at discharge.  PT - End of Session Activity Tolerance: Tolerates 30+ min activity with multiple rests Endurance Deficit: Yes PT Assessment Rehab Potential (ACUTE/IP ONLY): Good PT Barriers to Discharge: Home environment access/layout PT Barriers to Discharge Comments: stairs to enter PT Patient demonstrates impairments in the following area(s): Balance;Endurance;Motor PT Transfers Functional Problem(s): Bed to Chair;Car PT Locomotion Functional Problem(s): Ambulation;Stairs PT Plan PT Intensity: Minimum of 1-2 x/day ,45 to 90 minutes PT  Frequency: 5 out of 7 days PT Duration Estimated Length of Stay: 14-21 days PT Treatment/Interventions: Ambulation/gait training;Balance/vestibular training;Community reintegration;Discharge planning;Disease management/prevention;DME/adaptive equipment instruction;Functional mobility training;Functional electrical stimulation;Neuromuscular re-education;Patient/family education;Pain management;Psychosocial support;Stair training;Therapeutic Activities;Therapeutic Exercise;UE/LE Strength taining/ROM;UE/LE Coordination activities;Wheelchair propulsion/positioning PT Transfers Anticipated Outcome(s): Supervision PT Locomotion Anticipated Outcome(s): Nurse, learning disability PT Recommendation Follow Up Recommendations: Home health PT Patient destination: Home Equipment Recommended: To be determined   PT Evaluation Precautions/Restrictions Precautions Precautions: Fall Restrictions Weight Bearing Restrictions: No General Chart Reviewed: Yes Family/Caregiver Present: No Vital Signs Pain Pain Assessment Pain Scale: 0-10 Pain Score: 0-No pain Pain Interference Pain Interference Pain Effect on Sleep: 1. Rarely or not at all Pain Interference with Therapy Activities: 1. Rarely or not at all Pain Interference with Day-to-Day Activities: 1. Rarely or not at all Home Living/Prior [place] Available Help at Discharge: Family;Available 24 hours/day;Friend(s) Type of Home: Apartment Home Access: Stairs to enter Entrance Stairs-Number of Steps: 3 Home Layout: One level Bathroom Shower/Tub: Tub/shower unit;Curtain (shower seat) Armed forces training and education officer: Yes  Lives With: Daughter Prior Function Level of Independence: Independent with basic ADLs  Able to Take Stairs?: Reciprically Driving: Yes Vocation: Full time employment Vision/Perception  Vision - History Ability to See in Adequate Light: 0 Adequate Perception Perception: Within Functional  Limits Praxis Praxis: Intact  Cognition Overall Cognitive Status: Within Functional Limits for tasks assessed Arousal/Alertness: Awake/alert Orientation Level: Oriented X4 Year: 2023 Month: May Day of Week: Correct Attention: Sustained Sustained Attention: Appears intact Selective Attention: Appears intact Memory: Appears intact Awareness: Appears intact Problem Solving: Appears intact Executive Function: Reasoning;Self Correcting Reasoning: Appears intact Self Correcting: Appears intact Safety/Judgment: Appears intact Sensation Sensation Light Touch: Appears Intact Coordination Gross  Motor Movements are Fluid and Coordinated: No Fine Motor Movements are Fluid and Coordinated: No Finger Nose Finger Test: compensate with elbow d/t proximal weakness Motor  Motor Motor: Hemiplegia Motor - Skilled Clinical Observations: RLE>UE; proxmial weakness >distal weakness in RUE   Trunk/Postural Assessment  Cervical Assessment Cervical Assessment: Within Functional Limits Thoracic Assessment Thoracic Assessment: Within Functional Limits Lumbar Assessment Lumbar Assessment: Exceptions to [facility] (post pelvic tilt) Postural Control Postural Control: Deficits on evaluation (insufficient R)  Balance Balance Balance Assessed: Yes Dynamic Sitting Balance Dynamic Sitting - Balance Support: No upper extremity supported Dynamic Sitting - Level of Assistance: 5: Stand by assistance Static Standing Balance Static Standing - Level of Assistance: 4: Min assist;3: Mod assist Dynamic Standing Balance Dynamic Standing - Level of Assistance: 3: Mod assist;2: Max assist Dynamic Standing - Balance Activities: Lateral lean/weight shifting Extremity Assessment  RUE Assessment RUE Assessment: Exceptions to [facility] Strength Comments: proximal weakness shoulder with decreased activation; full elbow, wrist and digit activation RUE Body System: Neuro Brunstrum levels for arm and hand:  Arm;Hand Brunstrum level for arm: Stage III Synergy is performed voluntarily Brunstrum level for hand: Stage V Independence from basic synergies LUE Assessment LUE Assessment: Within Functional Limits RLE Assessment RLE Assessment: Exceptions to [facility] Strength Comments: gross 0/5 except 2/5 R hip ADD LLE Assessment LLE Assessment: Within Functional Limits  Care Tool Care Tool Bed Mobility Roll left and right activity   Roll left and right assist level: Contact Guard/Touching assist    Sit to lying activity   Sit to lying assist level: Minimal Assistance - Patient > 75%    Lying to sitting on side of bed activity   Lying to sitting on side of bed assist level: the ability to move from lying on the back to sitting on the side of the bed with no back support.: Minimal Assistance - Patient > 75%     Care Tool Transfers Sit to stand transfer   Sit to stand assist level: Moderate Assistance - Patient 50 - 74%    Chair/bed transfer   Chair/bed transfer assist level: Moderate Assistance - Patient 50 - 74%     Toilet transfer   Assist Level: Dependent - Patient 0%    Scientist, product/process development transfer activity did not occur: Safety/medical concerns (cannot use appropriate DME)        Care Tool Locomotion Ambulation Ambulation activity did not occur: Safety/medical concerns (0/5 R LE MMT)        Walk 10 feet activity Walk 10 feet activity did not occur: Safety/medical concerns (0/5 R LE MMT)       Walk 50 feet with 2 turns activity Walk 50 feet with 2 turns activity did not occur: Safety/medical concerns (0/5 R LE MMT)      Walk 150 feet activity Walk 150 feet activity did not occur: Safety/medical concerns (0/5 R LE MMT)      Walk 10 feet on uneven surfaces activity Walk 10 feet on uneven surfaces activity did not occur: Safety/medical concerns (0/5 R LE MMT)      Stairs Stair activity did not occur: Safety/medical concerns (0/5 R LE MMT)        Walk up/down 1 step  activity Walk up/down 1 step or curb (drop down) activity did not occur: Safety/medical concerns (0/5 R LE MMT)      Walk up/down 4 steps activity Walk up/down 4 steps activity did not occur: Safety/medical concerns (0/5 R LE MMT)  Walk up/down 12 steps activity Walk up/down 12 steps activity did not occur: Safety/medical concerns (0/5 R LE MMT)      Pick up small objects from floor Pick up small object from the floor (from standing position) activity did not occur: Safety/medical concerns (0/5 R LE MMT)      Wheelchair            Wheel 50 feet with 2 turns activity      Wheel 150 feet activity        Refer to Care Plan for Long Term Goals  SHORT TERM GOAL WEEK 1 PT Short Term Goal 1 (Week 1): Pt will transfer bed<>chair with CGA PT Short Term Goal 2 (Week 1): Pt will complete sit to stand from chair height with MinA + RW PT Short Term Goal 3 (Week 1): Pt will complete amb of 10 ft with ModA + RW  Recommendations for other services: None   Skilled Therapeutic Intervention Mobility Bed Mobility Bed Mobility: Rolling Right;Right Sidelying to Sit Rolling Right: Independent Right Sidelying to Sit: Contact Guard/Touching assist Transfers Transfers: Sit to Stand Sit to Stand: Moderate Assistance - Patient 50-74% Stand to Sit: Moderate Assistance - Patient 50-74% Stand Pivot Transfers: Moderate Assistance - Patient 50 - 74% Stand Pivot Transfer Details: Verbal cues for safe use of DME/AE;Manual facilitation for weight shifting;Verbal cues for precautions/safety Transfer via Lift Equipment: [place] Locomotion  Gait Ambulation: No  Session: Chart reviewed and pt agreeable to therapy. Pt received semi-reclined in bed with no c/o pain. Session focused on evaluation and initiation of therapy activities. Pt initiated session with eval as described above. Pt then completed sit to stand in [place] using MinA from elevated bed. Pt then completed toileting on BSC Pt continued session  with sit to stand in [place] using ModA from lower height. Pt practiced 3x sit to stand in ST[place] and then transferred to recliner from [place] with [place]. Session education emphasized care goals. At end of session, pt was left seated in recliner with alarm engaged, nurse call bell and all needs in reach.     Discharge Criteria: Patient will be discharged from PT if patient refuses treatment 3 consecutive times without medical reason, if treatment goals not met, if there is a change in medical status, if patient makes no progress towards goals or if patient is discharged from hospital.  The above assessment, treatment plan, treatment alternatives and goals were discussed and mutually agreed upon: by patient  Marquette Old 11/18/2021, 12:54 PM

## 2021-11-18 NOTE — Progress Notes (Signed)
PMR Admission Coordinator Pre-Admission Assessment   Patient: Sheryl Mcmillan is an 46 y.o., female MRN: [id number] DOB: Jun 27, 1975 Height: 5' 11" (180.3 cm) Weight: (!) 143 kg   Insurance Information HMO:     PPO:      PCP:      IPA:      80/20:      OTHER:  PRIMARY: Max medicaid [employer]      Policy#: [id number]      Subscriber: pt CM Name:       Phone#: [phone]     Fax#:  [phone] Pre-Cert#: [id number] approved for 8 days and f/u with [place] dept       Employer:  Benefits:  Phone #: [phone]     Name: 5/24 Eff. Date: 06/25/2020     Deduct: none      Out of Pocket Max: none      Life Max: none CIR: Per medicaid guidelines      SNF: per medicaid  Outpatient: per medicaid     Co-Pay:  Home Health: per medicaid      Co-Pay:  DME: per medicaid     Co-Pay:  Providers: in network  SECONDARY: none         Financial Counselor:       Phone#:    The Engineer, petroleum" for patients in Inpatient Rehabilitation Facilities with attached "Privacy Act [place] Records" was provided and verbally reviewed with: N/A   Emergency Contact Information Contact Information       Name Relation Home Work [name] Daughter     [phone]    Mabeline Caras     [phone]    Rogers,Marquita       [phone]         Current Medical History  Patient Admitting Diagnosis: Brain mass    Sheryl Mcmillan is a right-handed 46 year old female with a 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  She primarily complained of poor dexterity of her right hand.  Her past medical history is significant for triple negative breast cancer status postchemotherapy, surgery and immunotherapy.  Imaging revealed a solitary left frontal metastasis which appeared to be anterior to the precentral gyrus within the  supplementary motor area.  There was also significant associated edema.  Neurosurgery, Dr. Kathyrn Sheriff was consulted.  She was taken to the operating room on 5/19 and underwent stereotactic left frontal craniotomy for resection of the tumor.  Radiation oncology was also consulted for preoperative adjuvant stereotactic radiosurgery.  Surgical pathology was consistent with metastatic poorly differentiated adenocarcinoma breast primary.  Postoperatively, she exhibited right plegia greatest in the right lower extremity. Dr. Chryl Heck, her medical oncologist was also consulted and saw her on 5/23.  CT scans revealed no signs of metastatic disease in the chest, abdomen or pelvis. 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   Patient's medical record from [facility] has been reviewed by the rehabilitation admission coordinator and physician.   Past Medical History      Past Medical History:  Diagnosis Date   Arthritis     Breast cancer ([place])     Family history of non-Hodgkin's lymphoma 06/01/2020   GERD (gastroesophageal reflux disease)     Hemorrhoids     Migraines  PCOS (polycystic ovarian syndrome)     PONV (postoperative nausea and vomiting)      Has the patient had major surgery during 100 days prior to admission? Yes   Family History   family history includes Breast cancer in an other family member; Cancer (age of onset: 110) in her mother; Diabetes in her mother; Hypertension in her mother; Non-Hodgkin's lymphoma (age of onset: 54) in her brother; Other in her mother; Other (age of onset: 8) in her maternal uncle.   Current Medications   Current Facility-Administered Medications:    acetaminophen (TYLENOL) tablet 650 mg, 650 mg, Oral, Q6H PRN, 650 mg at 11/09/21 1230 **OR** acetaminophen (TYLENOL) suppository 650 mg, 650 mg, Rectal, Q6H PRN,  Consuella Lose, MD   Chlorhexidine Gluconate Cloth 2 % PADS 6 each, 6 each, Topical, q morning, [place], Suann Larry, MD, 6 each at 11/15/21 1025   cloNIDine (CATAPRES) tablet 0.1 mg, 0.1 mg, Oral, BID, Ashok Pall, MD, 0.1 mg at 11/15/21 8527   dexamethasone (DECADRON) tablet 4 mg, 4 mg, Oral, Q6H, Consuella Lose, MD, 4 mg at 11/15/21 1109   hydrALAZINE (APRESOLINE) injection 10 mg, 10 mg, Intravenous, Q4H PRN, Consuella Lose, MD, 10 mg at 11/13/21 2106   HYDROcodone-acetaminophen (NORCO/VICODIN) 5-325 MG per tablet 1 tablet, 1 tablet, Oral, Q4H PRN, Consuella Lose, MD, 1 tablet at 11/15/21 0824   labetalol (NORMODYNE) injection 10-40 mg, 10-40 mg, Intravenous, Q10 min PRN, Consuella Lose, MD, 40 mg at 11/13/21 2312   lactated ringers infusion, , Intravenous, Continuous, Consuella Lose, MD   levETIRAcetam (KEPPRA) tablet 500 mg, 500 mg, Oral, BID, Consuella Lose, MD, 500 mg at 11/15/21 7824   LORazepam (ATIVAN) injection 1 mg, 1 mg, Intravenous, Q8H PRN, Consuella Lose, MD   morphine (PF) 2 MG/ML injection 1-2 mg, 1-2 mg, Intravenous, Q2H PRN, Consuella Lose, MD, 1 mg at 11/12/21 1453   ondansetron (ZOFRAN) tablet 4 mg, 4 mg, Oral, Q6H PRN, 4 mg at 11/10/21 0909 **OR** ondansetron (ZOFRAN) injection 4 mg, 4 mg, Intravenous, Q6H PRN, Consuella Lose, MD, 4 mg at 11/13/21 2311   oxyCODONE (Oxy IR/ROXICODONE) immediate release tablet 5 mg, 5 mg, Oral, Q6H PRN, Consuella Lose, MD, 5 mg at 11/14/21 1812   pantoprazole (PROTONIX) EC tablet 40 mg, 40 mg, Oral, QHS, Donnamae Jude, RPH, 40 mg at 11/14/21 2132   prochlorperazine (COMPAZINE) injection 10 mg, 10 mg, Intravenous, Q6H PRN, Consuella Lose, MD   promethazine (PHENERGAN) tablet 12.5-25 mg, 12.5-25 mg, Oral, Q4H PRN, Consuella Lose, MD   senna-docusate (Senokot-S) tablet 1 tablet, 1 tablet, Oral, QHS PRN, Consuella Lose, MD, 1 tablet at 11/11/21 2107   Patients Current Diet:  Diet Order                   Diet Carb Modified Fluid consistency: Thin; Room service appropriate? Yes with Assist  Diet effective now                       Precautions / Restrictions Precautions Precautions: Fall Restrictions Weight Bearing Restrictions: No    Has the patient had 2 or more falls or a fall with injury in the past year? No   Prior Activity Level Community (5-7x/wk): Independent, driving, works as a Psychologist, sport and exercise Care: Did the patient need help bathing, dressing, using the toilet or eating? Independent   Indoor Mobility: Did the patient need assistance with walking from room to room (with or without device)? Independent  Stairs: Did the patient need assistance with internal or external stairs (with or without device)? Independent   Functional Cognition: Did the patient need help planning regular tasks such as shopping or remembering to take medications? Independent   Patient Information Are you of Hispanic, Latino/a,or Spanish origin?: A. No, not of Hispanic, Latino/a, or Spanish origin What is your race?: A. White Do you need or want an interpreter to communicate with a doctor or health care staff?: 0. No   Patient's Response To:  Health Literacy and Transportation Is the patient able to respond to health literacy and transportation needs?: Yes Health Literacy - How often do you need to have someone help you when you read instructions, pamphlets, or other written material from your doctor or pharmacy?: Never In the past 12 months, has lack of transportation kept you from medical appointments or from getting medications?: No In the past 12 months, has lack of transportation kept you from meetings, work, or from getting things needed for daily living?: No   Development worker, international aid / Equipment Home Equipment: BSC/3in1, Hospital bed, Shower seat   Prior Device Use: Indicate devices/aids used by the patient prior to current illness, exacerbation or  injury? None of the above   Current Functional Level Cognition   Overall Cognitive Status: Impaired/Different from baseline Current Attention Level: Selective Orientation Level: Oriented X4 Following Commands: Follows one step commands consistently Safety/Judgement: Decreased awareness of deficits General Comments: slow of speech and command following    Extremity Assessment (includes Sensation/Coordination)   Upper Extremity Assessment: RUE deficits/detail RUE Deficits / Details: 3/5 grip strength, some activity movemenr of wrist and elbow. No shoulder AROM RUE Sensation: WNL RUE Coordination: decreased fine motor, decreased gross motor  Lower Extremity Assessment: Defer to PT evaluation RLE Deficits / Details: 0/5 throughout with stiffness noted R ankle and knee. Pt reports she has been having cramping in R calf and adductors, not suring re eval though. RLE Sensation: decreased light touch, decreased proprioception RLE Coordination: decreased gross motor, decreased fine motor     ADLs   Overall ADL's : Needs assistance/impaired Eating/Feeding: Minimal assistance, Sitting Grooming: Minimal assistance, Sitting Upper Body Bathing: Moderate assistance, Sitting Lower Body Bathing: Total assistance, +2 for physical assistance, +2 for safety/equipment, Sit to/from stand Upper Body Dressing : Moderate assistance, Sitting Lower Body Dressing: Maximal assistance, +2 for physical assistance, +2 for safety/equipment, Sit to/from stand Toilet Transfer: Maximal assistance, +2 for physical assistance, +2 for safety/equipment, Stand-pivot Toileting- Clothing Manipulation and Hygiene: Maximal assistance, +2 for physical assistance, +2 for safety/equipment, Sit to/from stand Functional mobility during ADLs: Maximal assistance, +2 for physical assistance, +2 for safety/equipment General ADL Comments: limited by RLE and RUE weakness, impaired balance poor activity tolerance     Mobility   Overal  bed mobility: Needs Assistance Bed Mobility: Rolling, Supine to Sit Rolling: Min guard (toward rt) Supine to sit: Mod assist, +2 for physical assistance, +2 for safety/equipment Sit to supine: Max assist, +2 for physical assistance General bed mobility comments: Assist to bring RLE off of bed and elevate trunk into sitting.     Transfers   Overall transfer level: Needs assistance Equipment used: Ambulation equipment used, 2 person hand held assist Transfers: Sit to/from Stand, Bed to chair/wheelchair/BSC Sit to Stand: Mod assist, +2 physical assistance, +2 safety/equipment Bed to/from chair/wheelchair/BSC transfer type:: Via Lift equipment Transfer via Lift Equipment: [place] transfer comment: Assist to bring hips up and for balance. Rt knee blocked by Charlaine Dalton pad. Pt's rt foot  maintained good position with sitting and standing with Stedy. Stood x 3 with Engineer, water / Gait / Stairs / Emergency planning/management officer   Ambulation/Gait Ambulation/Gait assistance: Scientist, forensic (Feet): 300 Feet Assistive device: None Gait Pattern/deviations: Step-through pattern, Decreased stride length General Gait Details: unable Gait velocity: WFL Gait velocity interpretation: >2.62 ft/sec, indicative of community ambulatory Pre-gait activities: Stood in [place] x 3 for 1-2 minutes and worked to try to activate hip/knee extension with manual facilitation.     Posture / Balance Dynamic Sitting Balance Sitting balance - Comments: Prefers to have UE support but not required Balance Overall balance assessment: Needs assistance Sitting-balance support: Feet supported Sitting balance-Leahy Scale: Fair Sitting balance - Comments: Prefers to have UE support but not required Postural control: Right lateral lean, Posterior lean Standing balance support: Bilateral upper extremity supported Standing balance-Leahy Scale: Poor Standing balance comment: Stood x 3 with Stedy for 1-2 minutes with min  assist to maintain High level balance activites: Head turns (looking up and down) High Level Balance Comments: pt with min HHA for performing higher level balance challenges with gait     Special needs/care consideration        Previous Home Environment  Living Arrangements:  (76 year old daughter, Ship broker)  Lives With: Daughter Available Help at Discharge:  (daughter wil have she and friends 24/7) Type of Home: Apartment Home Layout: One level Home Access: Stairs to enter Technical brewer of Steps: 3 Bathroom Shower/Tub: Chiropodist: Standard Bathroom Accessibility: Yes How Accessible: Accessible via walker Cornell: No   Discharge Living Setting Plans for Discharge Living Setting: Patient's home, Lives with (comment), Apartment (66 year old daughter) Type of Home at Discharge: Apartment Discharge Home Layout: One level Discharge Home Access: Stairs to enter Entrance Stairs-Rails: None Entrance Stairs-Number of Steps: 3 Discharge Bathroom Shower/Tub: Tub/shower unit Discharge Bathroom Toilet: Standard Discharge Bathroom Accessibility: Yes How Accessible: Accessible via walker Does the patient have any problems obtaining your medications?: No   Social/Family/Support Systems Patient Roles: Parent (employee; she is a Quarry manager) Sport and exercise psychologist Information: daughter, Jazmine Anticipated Caregiver: daughter and friends who are nurses Anticipated Ambulance person Information: see contacts Ability/Limitations of Caregiver: daughter works as Chief Operating Officer, she is a Quarry manager and begins school at [employer] and [employer] Caregiver Availability: 24/7 Discharge Plan Discussed with Primary Caregiver: Yes Is Caregiver In Agreement with Plan?: Yes Does Caregiver/Family have Issues with Lodging/Transportation while Pt is in Rehab?: No   Goals Patient/Family Goal for Rehab: min assist with PT and OT at wheelchair level, supervision with SLP Expected length of stay: ELOS 14 to 20  days Additional Information: Oncology follow up needed Pt/Family Agrees to Admission and willing to participate: Yes Program Orientation Provided & Reviewed with Pt/Caregiver Including Roles  & Responsibilities: Yes   Decrease burden of Care through IP rehab admission: n/a   Possible need for SNF placement upon discharge: not anticipated   Patient Condition: I have reviewed medical records from [facility], spoken with  patient and daughter. I met with patient at the bedside for inpatient rehabilitation assessment.  Patient will benefit from ongoing PT, OT, and SLP, can actively participate in 3 hours of therapy a day 5 days of the week, and can make measurable gains during the admission.  Patient will also benefit from the coordinated team approach during an Inpatient Acute Rehabilitation admission.  The patient will receive intensive therapy as well as Rehabilitation physician, nursing, social worker, and care management interventions.  Due to bladder management, bowel management, safety, skin/wound care, disease management, medication administration, pain management, and patient education the patient requires 24 hour a day rehabilitation nursing.  The patient is currently Min +2- mod A with mobility and basic ADLs.  Discharge setting and therapy post discharge at home with home health is anticipated.  Patient has agreed to participate in the Acute Inpatient Rehabilitation Program and will admit today.   Preadmission Screen Completed By:  Danne Baxter RN MSN Cleatrice Burke, 11/15/2021 12:04 PM ______________________________________________________________________   Discussed status with Dr. Naaman Plummer on 11/17/21 at 46 and received approval for admission today.   Admission Coordinator: Danne Baxter RN MSN with updates by Clemens Catholic, MS, Ko Olina, Audelia Acton, RN, time 2:16pm Sudie Grumbling 11/17/21    Assessment/Plan: Diagnosis: right brain mass s/p resection Does the need  for close, 24 hr/day Medical supervision in concert with the patient's rehab needs make it unreasonable for this patient to be served in a less intensive setting? Yes Co-Morbidities requiring supervision/potential complications: breast cancer, right hemiparesis Due to bladder management, bowel management, safety, skin/wound care, disease management, medication administration, pain management, and patient education, does the patient require 24 hr/day rehab nursing? Yes Does the patient require coordinated care of a physician, rehab nurse, PT, OT, and SLP to address physical and functional deficits in the context of the above medical diagnosis(es)? Yes Addressing deficits in the following areas: balance, endurance, locomotion, strength, transferring, bowel/bladder control, bathing, dressing, feeding, grooming, toileting, cognition, and psychosocial support Can the patient actively participate in an intensive therapy program of at least 3 hrs of therapy 5 days a week? Yes The potential for patient to make measurable gains while on inpatient rehab is excellent Anticipated functional outcomes upon discharge from inpatient rehab: min assist PT, min assist OT, modified independent SLP at w/c level Estimated rehab length of stay to reach the above functional goals is: 14-20 days Anticipated discharge destination: Home 10. Overall Rehab/Functional Prognosis: excellent     MD Signature: Meredith Staggers, MD, [place] Director Rehabilitation Services 11/17/2021

## 2021-11-18 NOTE — Evaluation (Signed)
Speech Language Pathology Assessment and Plan  Patient Details  Name: Sheryl Mcmillan MRN: [id number] Date of Birth: 1976-02-19  SLP Diagnosis:  (N/A)  Rehab Potential: Excellent ELOS: N/A for ST intervention    Today's Date: 11/18/2021 SLP Individual Time: 7902-4097 Total Time Calculations: 69 minutes   Hospital Problem: Principal Problem:   Brain tumor [facility])  Past Medical History:  Past Medical History:  Diagnosis Date   Arthritis    Breast cancer ([place])    Family history of non-Hodgkin's lymphoma 06/01/2020   GERD (gastroesophageal reflux disease)    Hemorrhoids    Migraines    PCOS (polycystic ovarian syndrome)    PONV (postoperative nausea and vomiting)    Past Surgical History:  Past Surgical History:  Procedure Laterality Date   APPLICATION OF CRANIAL NAVIGATION Left 11/10/2021   Procedure: APPLICATION OF CRANIAL NAVIGATION;  Surgeon: Consuella Lose, MD;  Location: [place];  Service: Neurosurgery;  Laterality: Left;   AXILLARY SENTINEL NODE BIOPSY Left 11/07/2020   Procedure: LEFT AXILLARY SENTINEL NODE BIOPSY;  Surgeon: [name], MD;  Location: [place];  Service: General;  Laterality: Left;   BREAST CYST EXCISION Right 09/22/2021   Procedure: Excision of right axillary excess soft tissue.;  Surgeon: [name], MD;  Location: [place];  Service: Plastics;  Laterality: Right;   BREAST RECONSTRUCTION WITH PLACEMENT OF TISSUE EXPANDER AND FLEX HD (ACELLULAR HYDRATED DERMIS) Bilateral 11/07/2020   Procedure: BILATERAL BREAST RECONSTRUCTION WITH PLACEMENT OF TISSUE EXPANDER AND FLEX HD (ACELLULAR HYDRATED DERMIS);  Surgeon: [name], MD;  Location: [place];  Service: Plastics;  Laterality: Bilateral;   COSMETIC SURGERY     CRANIOTOMY Left 11/10/2021   Procedure: LT FRONTAL CRANIOTOMY TUMOR EXCISION;  Surgeon: Consuella Lose, MD;  Location: [place];  Service: Neurosurgery;  Laterality: Left;   DILATION AND CURETTAGE OF UTERUS      HEMORRHOID SURGERY     IR IMAGING GUIDED PORT INSERTION  06/15/2020   LIPOSUCTION WITH LIPOFILLING Bilateral 09/22/2021   Procedure: Fat grafting bilateral breast;  Surgeon: [name], MD;  Location: [place];  Service: Plastics;  Laterality: Bilateral;   MASS EXCISION Bilateral 04/25/2021   Procedure: excision of bilateral axillary breast tissue;  Surgeon: [name], MD;  Location: [place];  Service: Plastics;  Laterality: Bilateral;   PORT-A-CATH REMOVAL Right 11/07/2020   Procedure: REMOVAL PORT-A-CATH;  Surgeon: [name], MD;  Location: [place];  Service: General;  Laterality: Right;   REMOVAL OF BILATERAL TISSUE EXPANDERS WITH PLACEMENT OF BILATERAL BREAST IMPLANTS Bilateral 04/25/2021   Procedure: REMOVAL OF BILATERAL TISSUE EXPANDERS WITH PLACEMENT OF BILATERAL BREAST IMPLANTS;  Surgeon: [name], MD;  Location: [place];  Service: Plastics;  Laterality: Bilateral;   ROBOTIC ASSISTED TOTAL HYSTERECTOMY WITH BILATERAL SALPINGO OOPHERECTOMY Bilateral 03/14/2021   Procedure: XI ROBOTIC ASSISTED TOTAL HYSTERECTOMY WITH BILATERAL SALPINGO OOPHORECTOMY;  Surgeon: Everitt Amber, MD;  [facility]: [facility];  Service: Gynecology;  Laterality: Bilateral;   TOTAL MASTECTOMY Bilateral 11/07/2020   Procedure: BILATERAL MASTECTOMY;  Surgeon: [name], MD;  Location: [place];  Service: General;  Laterality: Bilateral;    Assessment / Plan / Recommendation Clinical Impression HPI: Patient is a 46 y.o. female with PMH: left breast cancer s/p neoadjuvant chemotherapy, bilateral mastectomy, hysterectomy. She presented to the ED for evaluation of right sided weakness. CT head showed 2.0cm x 2.cm parenchymal mass within the posteromedial left frontal lobe and moderate surrounding vasogenic edema with resultant  mass effect, partial effacement of the left ventricle and trace rightward midline shift. Neurosurgery consulted and recommendation  was for starting on IV Decadron 4 mg every 6 hours, IV Keppra 500 mg twice daily.  She underwent a left frontal craniotomy tumor excision on 5/19. Admitted to CIR on 11/17/2021 for further intervention.  Cognitive-linguistic evaluation completed, at the request of Sheryl Banner, PA-C, s/p brain tumor resection. At this time, pt presents with cognitive-linguistic functioning WFL for all cognitive domains assessed (memory, attention, visuospatial, language, and executive functioning). Receptive and expressive language skills are intact for single and multi-step commands and pt successfully communicates all needs, wants, thoughts, and ideas with no difficulty. Speech is fluent and fully intelligible.   Based on pt's performance, cognitive-linguistic intervention does not appears indicated at this time. Brief observation of swallow function when LPN provided morning medications whole with thin liquids; no difficulty observed with orofacial movements appearing WFL. No follow-up recommended warranted. Discussed the importance of engaging in cognitively stimulating activities during hospitalization and at home; she verbalized understanding and reports she engages in various tasks at home (reading, puzzles, word games, etc.).    Skilled Therapeutic Interventions          COGNISTAT and subtests from CAM, CLQT, and  informal assessment.   SLP Assessment  Patient does not need any further Speech Lanaguage Pathology Services    Recommendations  SLP Diet Recommendations: Age appropriate regular solids;Thin Liquid Administration via: Cup;Straw Medication Administration: Whole meds with liquid Compensations: Slow rate;Small sips/bites;Minimize environmental distractions    SLP Frequency  (N/A)   SLP Duration  SLP Intensity  SLP Treatment/Interventions N/A for ST intervention   (N/A)   (N/A)    Pain Pain Assessment Pain Scale: 0-10 Pain Score: 1   Prior Functioning Cognitive/Linguistic Baseline:  Baseline deficits Baseline deficit details: Memory deficits and stuttering prior to brain tumor resection Type of Home: Apartment  Lives With: Daughter [name]) Available Help at Discharge: Family;Available 24 hours/day;Friend(s) Education: Some college Vocation: Full time employment (was working as an in Midwife for [employer])  SLP Evaluation Cognition Overall Cognitive Status: Within Functional Limits for tasks assessed Arousal/Alertness: Awake/alert Orientation Level: Oriented X4 Year: 2023 Month: May Day of Week: Correct Attention: Sustained;Selective;Alternating Sustained Attention: Appears intact (COGNISTAT attention subtest score: 8/8 - WFL) Selective Attention: Appears intact Alternating Attention: Appears intact Memory: Appears intact (COGNISTAT delayed recall subtest score - 12/12 - WFL; recall 5 out of 6 medications independently) Awareness: Appears intact Problem Solving: Appears intact (COGNISTAT mental math calculations subtest score - 3/4; COGNISTAT verbal abstract reasoning subtest score - 8/8 - WFL; functional math calculations - 10/10 - WFL; CLQT clock drawing subtest score - 13/13 - WFL; CAM Maze - 7/8 - WFL) Executive Function: Reasoning;Self Correcting Reasoning: Appears intact Self Correcting: Appears intact Safety/Judgment: Appears intact (COGNISTAT verbal judgment subtest score - 5/6 - WFL)  Comprehension Auditory Comprehension Overall Auditory Comprehension: Appears within functional limits for tasks assessed Expression Expression Primary Mode of Expression: Verbal Verbal Expression Overall Verbal Expression: Appears within functional limits for tasks assessed Oral Motor Oral Motor/Sensory Function Overall Oral Motor/Sensory Function: Within functional limits Motor Speech Overall Motor Speech: Appears within functional limits for tasks assessed Motor Planning: Witnin functional limits  Care Tool Care Tool Cognition Ability to hear  (with hearing aid or hearing appliances if normally used Ability to hear (with hearing aid or hearing appliances if normally used): 0. Adequate - no difficulty in normal conservation, social interaction, listening to TV  Expression of Ideas and Wants Expression of Ideas and Wants: 4. Without difficulty (complex and basic) - expresses complex messages without difficulty and with speech that is clear and easy to understand   Understanding Verbal and Non-Verbal Content Understanding Verbal and Non-Verbal Content: 4. Understands (complex and basic) - clear comprehension without cues or repetitions  Memory/Recall Ability      Short Term Goals: Week 1: N/A  Recommendations for other services: None   Discharge Criteria: Patient will be discharged from SLP if patient refuses treatment 3 consecutive times without medical reason, if treatment goals not met, if there is a change in medical status, if patient makes no progress towards goals or if patient is discharged from hospital.  The above assessment, treatment plan, treatment alternatives and goals were discussed and mutually agreed upon: by patient  Romelle Starcher A Shigeko Manard 11/18/2021, 8:09 AM

## 2021-11-18 NOTE — Evaluation (Signed)
Occupational Therapy Assessment and Plan  Patient Details  Name: Sheryl Mcmillan MRN: [id number] Date of Birth: [birth year]  OT Diagnosis: abnormal posture, hemiplegia affecting dominant side, and muscle weakness (generalized) Rehab Potential: Rehab Potential (ACUTE ONLY): Good ELOS: 16-20 days   Today's Date: 11/18/2021 OT Individual Time: 1045-1200 OT Individual Time Calculation (min): 75 min     Hospital Problem: Principal Problem:   Brain tumor [facility])   Past Medical History:  Past Medical History:  Diagnosis Date   Arthritis    Breast cancer ([place])    Family history of non-Hodgkin's lymphoma 06/01/2020   GERD (gastroesophageal reflux disease)    Hemorrhoids    Migraines    PCOS (polycystic ovarian syndrome)    PONV (postoperative nausea and vomiting)    Past Surgical History:  Past Surgical History:  Procedure Laterality Date   APPLICATION OF CRANIAL NAVIGATION Left 11/10/2021   Procedure: APPLICATION OF CRANIAL NAVIGATION;  Surgeon: Consuella Lose, MD;  Location: [place];  Service: Neurosurgery;  Laterality: Left;   AXILLARY SENTINEL NODE BIOPSY Left 11/07/2020   Procedure: LEFT AXILLARY SENTINEL NODE BIOPSY;  Surgeon: [name], MD;  Location: [place];  Service: General;  Laterality: Left;   BREAST CYST EXCISION Right 09/22/2021   Procedure: Excision of right axillary excess soft tissue.;  Surgeon: [name], MD;  Location: [place];  Service: Plastics;  Laterality: Right;   BREAST RECONSTRUCTION WITH PLACEMENT OF TISSUE EXPANDER AND FLEX HD (ACELLULAR HYDRATED DERMIS) Bilateral 11/07/2020   Procedure: BILATERAL BREAST RECONSTRUCTION WITH PLACEMENT OF TISSUE EXPANDER AND FLEX HD (ACELLULAR HYDRATED DERMIS);  Surgeon: [name], MD;  Location: [place];  Service: Plastics;  Laterality: Bilateral;   COSMETIC SURGERY     CRANIOTOMY Left 11/10/2021   Procedure: LT FRONTAL CRANIOTOMY TUMOR EXCISION;  Surgeon: Consuella Lose, MD;  Location: [place];  Service: Neurosurgery;  Laterality: Left;   DILATION AND CURETTAGE OF UTERUS     HEMORRHOID SURGERY     IR IMAGING GUIDED PORT INSERTION  06/15/2020   LIPOSUCTION WITH LIPOFILLING Bilateral 09/22/2021   Procedure: Fat grafting bilateral breast;  Surgeon: [name], MD;  Location: [place];  Service: Plastics;  Laterality: Bilateral;   MASS EXCISION Bilateral 04/25/2021   Procedure: excision of bilateral axillary breast tissue;  Surgeon: [name], MD;  Location: [place];  Service: Plastics;  Laterality: Bilateral;   PORT-A-CATH REMOVAL Right 11/07/2020   Procedure: REMOVAL PORT-A-CATH;  Surgeon: [name], MD;  [place];  Service: General;  Laterality: Right;   REMOVAL OF BILATERAL TISSUE EXPANDERS WITH PLACEMENT OF BILATERAL BREAST IMPLANTS Bilateral 04/25/2021   Procedure: REMOVAL OF BILATERAL TISSUE EXPANDERS WITH PLACEMENT OF BILATERAL BREAST IMPLANTS;  Surgeon: [name], MD;  Location: [place];  Service: Plastics;  Laterality: Bilateral;   ROBOTIC ASSISTED TOTAL HYSTERECTOMY WITH BILATERAL SALPINGO OOPHERECTOMY Bilateral 03/14/2021   Procedure: XI ROBOTIC ASSISTED TOTAL HYSTERECTOMY WITH BILATERAL SALPINGO OOPHORECTOMY;  Surgeon: Everitt Amber, MD;  [facility];  Service: Gynecology;  Laterality: Bilateral;   TOTAL MASTECTOMY Bilateral 11/07/2020   Procedure: BILATERAL MASTECTOMY;  Surgeon: [name], MD;  Location: [place];  Service: General;  Laterality: Bilateral;    Assessment & Plan Clinical Impression:  Pt is a 46 y.o. female who presented to the ED 5/16 with R side weakness.  MRI revealed partially solid contrast-enhancing mass in the superior left hemisphere with large area of vasogenic edema, most consistent with  metastatic  disease. Now s/p L frontal crani tumor excision 5/19. 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    Patient currently requires mod with basic self-care skills secondary to muscle weakness, decreased cardiorespiratoy endurance, impaired timing and sequencing and abnormal tone, and decreased sitting balance, decreased standing balance, decreased postural control, and decreased balance strategies.  Prior to hospitalization, patient could complete BADL/IADL with modified independent .  Patient will benefit from skilled intervention to increase independence with basic self-care skills and increase level of independence with iADL prior to discharge home with care partner.  Anticipate patient will require 24 hour supervision and follow up home health.  OT - End of Session Activity Tolerance: Tolerates 30+ min activity with multiple rests Endurance Deficit: Yes OT Assessment Rehab Potential (ACUTE ONLY): Good OT Barriers to Discharge: Inaccessible home environment;Home environment access/layout;Weight OT Patient demonstrates impairments in the following area(s): Balance;Endurance;Motor;Pain;Safety OT Basic ADL's Functional Problem(s): Eating;Grooming;Bathing;Dressing;Toileting OT Advanced ADL's Functional Problem(s): Simple Meal Preparation OT Transfers Functional Problem(s): Toilet;Tub/Shower OT Additional Impairment(s): Fuctional Use of Upper Extremity OT Plan OT Intensity: Minimum of 1-2 x/day, 45 to 90 minutes OT Frequency: 5 out of 7 days OT Duration/Estimated Length of Stay: 16-20 days OT Treatment/Interventions: Balance/vestibular training;Discharge planning;Functional electrical stimulation;Pain management;Self Care/advanced ADL retraining;Therapeutic Activities;UE/LE Coordination activities;Visual/perceptual remediation/compensation;Therapeutic Exercise;Skin care/wound managment;Patient/family education;Functional mobility training;Disease mangement/prevention;Cognitive remediation/compensation;Community reintegration;Neuromuscular  re-education;DME/adaptive equipment instruction;Psychosocial support;Splinting/orthotics;UE/LE Strength taining/ROM;Wheelchair propulsion/positioning OT Self Feeding Anticipated Outcome(s): S OT Basic Self-Care Anticipated Outcome(s): S OT Toileting Anticipated Outcome(s): S OT Bathroom Transfers Anticipated Outcome(s): S OT Recommendation Patient destination: Home Follow Up Recommendations: Home health OT Equipment Recommended: Tub/shower bench;To be determined   OT Evaluation Precautions/Restrictions  Precautions Precautions: Fall Restrictions Weight Bearing Restrictions: No General Chart Reviewed: Yes Family/Caregiver Present: No Vital Signs  Pain Pain Assessment Pain Scale: 0-10 Pain Score: 0-No pain Home Living/Prior Functioning Home Living Family/patient expects to be discharged to:: Private residence Living Arrangements: Children Available Help at Discharge: Family, Available 24 hours/day, Friend(s) Type of Home: Apartment Home Access: Stairs to enter [employer] of Steps: 3 Home Layout: One level Bathroom Shower/Tub: Tub/shower unit, Curtain (shower seat) Armed forces training and education officer: Yes  Lives With: Daughter IADL History Education: Some college Prior Function Level of Independence: Independent with basic ADLs  Able to Take Stairs?: Reciprically Driving: Yes Vocation: Full time employment Vision Baseline Vision/History: 0 No visual deficits Ability to See in Adequate Light: 0 Adequate Patient Visual Report: No change from baseline Vision Assessment?: No apparent visual deficits Perception  Perception: Within Functional Limits Praxis Praxis: Intact Cognition Cognition Overall Cognitive Status: Within Functional Limits for tasks assessed Arousal/Alertness: Awake/alert Memory: Appears intact Attention: Sustained Sustained Attention: Appears intact Selective Attention: Appears intact Awareness: Appears intact Problem  Solving: Appears intact Executive Function: Reasoning;Self Correcting Reasoning: Appears intact Self Correcting: Appears intact Safety/Judgment: Appears intact Brief Interview for Mental Status (BIMS) Repetition of Three Words (First Attempt): 3 Temporal Orientation: Year: Correct Temporal Orientation: Month: Accurate within 5 days Temporal Orientation: Day: Correct Recall: "Sock": Yes, no cue required Recall: "Blue": Yes, no cue required Recall: "Bed": Yes, no cue required BIMS Summary Score: 15 Sensation Sensation Light Touch: Appears Intact Coordination Gross Motor Movements are Fluid and Coordinated: No Fine Motor Movements are Fluid and Coordinated: No Finger Nose Finger Test: compensate with elbow d/t proximal weakness Motor  Motor Motor: Hemiplegia Motor - Skilled Clinical Observations: RLE>UE; proxmial weakness >distal weakness in RUE  Trunk/Postural Assessment  Cervical  Assessment Cervical Assessment: Within Functional Limits Thoracic Assessment Thoracic Assessment: Within Functional Limits Lumbar Assessment Lumbar Assessment: Exceptions to [facility] (post pelvic tilt) Postural Control Postural Control: Deficits on evaluation (insufficient R)  Balance Balance Balance Assessed: Yes Dynamic Sitting Balance Dynamic Sitting - Level of Assistance: 5: Stand by assistance;4: Min assist Static Standing Balance Static Standing - Level of Assistance: 4: Min assist;3: Mod assist Dynamic Standing Balance Dynamic Standing - Level of Assistance: 3: Mod assist;2: Max assist Dynamic Standing - Balance Activities: Lateral lean/weight shifting Extremity/Trunk Assessment RUE Assessment RUE Assessment: Exceptions to [facility] General Strength Comments: proximal weakness shoulder with decreased activation; full elbow, wrist and digit activation RUE Body System: Neuro Brunstrum levels for arm and hand: Arm;Hand Brunstrum level for arm: Stage III Synergy is performed voluntarily Brunstrum  level for hand: Stage V Independence from basic synergies LUE Assessment LUE Assessment: Within Functional Limits  Care Tool Care Tool Self Care Eating   Eating Assist Level: Set up assist    Oral Care    Oral Care Assist Level: Supervision/Verbal cueing    Bathing   Body parts bathed by patient: Right arm;Left arm;Chest;Abdomen;Front perineal area;Right upper leg;Left upper leg Body parts bathed by helper: Buttocks;Right lower leg;Left lower leg   Assist Level: Maximal Assistance - Patient 24 - 49%    Upper Body Dressing(including orthotics)   What is the patient wearing?: Dress   Assist Level: Moderate Assistance - Patient 50 - 74%    Lower Body Dressing (excluding footwear)   What is the patient wearing?: Underwear/pull up Assist for lower body dressing: Maximal Assistance - Patient 25 - 49%    Putting on/Taking off footwear   What is the patient wearing?: Non-skid slipper socks Assist for footwear: Total Assistance - Patient < 25%       Care Tool Toileting Toileting activity   Assist for toileting: Dependent - Patient 0% (stedy)     Care Tool Bed Mobility Roll left and right activity        Sit to lying activity        Lying to sitting on side of bed activity         Care Tool Transfers Sit to stand transfer        Chair/bed transfer         Toilet transfer   Assist Level: Dependent - Patient 0% (stedy)     Care Tool Cognition  Expression of Ideas and Wants Expression of Ideas and Wants: 4. Without difficulty (complex and basic) - expresses complex messages without difficulty and with speech that is clear and easy to understand  Understanding Verbal and Non-Verbal Content Understanding Verbal and Non-Verbal Content: 4. Understands (complex and basic) - clear comprehension without cues or repetitions   Memory/Recall Ability Memory/Recall Ability : Current season;Location of own room;Staff names and faces;That he or she is in a hospital/hospital unit    Refer to Care Plan for [place] 1 OT Short Term Goal 1 (Week 1): Pt will consistently transfer to toilet wiht MIN A and LRAD OT Short Term Goal 2 (Week 1): Pt will bathe 8/10 body parts with AE PRN OT Short Term Goal 3 (Week 1): Pt will recall hemi dressing techiques with no cuing OT Short Term Goal 4 (Week 1): Pt will complete 1/3 components of toileting  Recommendations for other services: Therapeutic Recreation  Pet therapy, Kitchen group, Stress management, and Outing/community reintegration   Skilled Therapeutic Intervention 1:1. Pt received in  recliner agreeable to OT after edu re OT role/purpose, CIR, ELOS and POC. Pt completes BADL at shower level with shower cap over head to protect incision. See below of ADL performance details. Pt demo decreased postural control in stance and poor eccentric control during stand>sit. Pt requires up to MOD A in stedy for sit to stand from low surfaces. Pt educated on hemi dressing techniques and positioning for transitional movements. R knee block provided for R knee stability during SPT/STS without stedy. Discussed scar massage for tender L breast scar as well as pt centered goal setting around returning to cooking. Exited session with pt seated in bed, exit alarm on and call light in reach   ADL ADL Upper Body Bathing: Supervision/safety Where Assessed-Upper Body Bathing: Shower Lower Body Bathing: Maximal assistance Where Assessed-Lower Body Bathing: Shower Upper Body Dressing: Moderate assistance Where Assessed-Upper Body Dressing: Wheelchair Lower Body Dressing: Maximal assistance Where Assessed-Lower Body Dressing: Wheelchair Toileting: Dependent (stedy) Where Assessed-Toileting: Bedside Commode Toilet Transfer: Dependent Toilet Transfer Method: Other (comment) (stedy) Walk-In Shower Transfer: Dependent Youth worker: Radio broadcast assistant;Other (comment) (stedy) Mobility  Transfers Sit to Stand:  Moderate Assistance - Patient 50-74% Stand to Sit: Moderate Assistance - Patient 50-74%   Discharge Criteria: Patient will be discharged from OT if patient refuses treatment 3 consecutive times without medical reason, if treatment goals not met, if there is a change in medical status, if patient makes no progress towards goals or if patient is discharged from hospital.  The above assessment, treatment plan, treatment alternatives and goals were discussed and mutually agreed upon: by patient  Tonny Branch 11/18/2021, 12:25 PM

## 2021-11-19 LAB — HEMOGLOBIN A1C
Hgb A1c MFr Bld: 6.8 % — ABNORMAL HIGH (ref 4.8–5.6)
Mean Plasma Glucose: 148.46 mg/dL

## 2021-11-19 MED ORDER — INSULIN ASPART 100 UNIT/ML IJ SOLN
0.0000 [IU] | Freq: Three times a day (TID) | INTRAMUSCULAR | Status: DC
  Administered 2021-11-20: 2 [IU] via SUBCUTANEOUS
  Administered 2021-11-21: 3 [IU] via SUBCUTANEOUS
  Administered 2021-11-21 (×2): 2 [IU] via SUBCUTANEOUS
  Administered 2021-11-22: 1 [IU] via SUBCUTANEOUS
  Administered 2021-11-22: 3 [IU] via SUBCUTANEOUS
  Administered 2021-11-23 (×3): 2 [IU] via SUBCUTANEOUS
  Administered 2021-11-24: 1 [IU] via SUBCUTANEOUS
  Administered 2021-11-24 (×2): 3 [IU] via SUBCUTANEOUS
  Administered 2021-11-25 – 2021-11-27 (×7): 2 [IU] via SUBCUTANEOUS

## 2021-11-19 MED ORDER — MELATONIN 5 MG PO TABS
5.0000 mg | ORAL_TABLET | Freq: Every evening | ORAL | Status: DC | PRN
  Administered 2021-11-20 (×2): 5 mg via ORAL
  Filled 2021-11-19 (×2): qty 1

## 2021-11-19 NOTE — Progress Notes (Signed)
Occupational Therapy Session Note  Patient Details  Name: Sheryl Mcmillan MRN: [id number] Date of Birth: September 30, 1975  Today's Date: 11/19/2021 OT Individual Time: 1005-1100 OT Individual Time Calculation (min): 55 min    Short Term Goals: Week 1:  OT Short Term Goal 1 (Week 1): Pt will consistently transfer to toilet wiht MIN A and LRAD OT Short Term Goal 2 (Week 1): Pt will bathe 8/10 body parts with AE PRN OT Short Term Goal 3 (Week 1): Pt will recall hemi dressing techiques with no cuing OT Short Term Goal 4 (Week 1): Pt will complete 1/3 components of toileting  Skilled Therapeutic Interventions/Progress Updates:  Skilled OT intervention completed with focus on functional transfers, mass practice sit > stands. Pt received upright in bed, agreeable to session, no c/o pain. Preferring to [facility] only, with set up A, then supervision for upright in bed to EOB transition. Encouraged shoes for good strong BOS, with mod A needed to donn with most assist on RLE. Lateral scoot > L from EOB with CGA then pt able to self-propel in w/c via hemi technique from room <> gym with supervision. Education provided about self-propulsion and safety with preventing RUE getting entangled in wheel coordination deficits. Able to position w/c next to mat with supervision with education provided about proper placing for safe transfer. Lateral scoot > L from w/c to EOM with CGA. Retrieved RW for pt to utilize, with pt able to demonstrate AROM of RUE to self-place with no physical assist, then practice with 10 sit <> stands with use of mirror for visual feedback, with assist fading from Max to min A. Pt presented with L lean, avoiding putting weight on RLE, with therapist providing knee blocking at R knee to prevent buckling, with no buckling noted however weakness present with difficult contracting quads for full WB. Up to mod A needed for standing balance, however improved to min A, with education provided about widening  BLE BOS as pt has tendency to start to stand with wide stance, then brings RLE into narrow support. Encouraged light weight shifting to R side, with pt able to report feeling more stable with weight on R. Poor descent control upon sitting with CGA needed. Lateral scoot back to w/c with min A, with pt with poor safety awareness of w/c being unlocked when trying to laterally lean, with cues needed to slow down and pt reporting "I do need to slow down to remember my body doesn't work like it used to." Pt was left seated in w/c, with belt alarm on, all needs in reach and family present at end of session.   Therapy Documentation Precautions:  Precautions Precautions: Fall Restrictions Weight Bearing Restrictions: No    Therapy/Group: Individual Therapy  Chrisanna Mishra E Rakeb Kibble 11/19/2021, 7:53 AM

## 2021-11-19 NOTE — Progress Notes (Signed)
PROGRESS NOTE   Subjective/Complaints:  Pt reports 2 large BM's yesterday after meds.  Feels much better and stomach much less tight.   Melatonin helped 9pm to 3am, but couldn't sleep after that- wants melatonin prn.     ROS:  Pt denies SOB, abd pain, CP, N/V/C/D, and vision changes  Objective:   No results found. Recent Labs    11/18/21 0526  WBC 15.9*  HGB 13.8  HCT 41.0  PLT 261   Recent Labs    11/18/21 0526  NA 136  K 4.2  CL 103  CO2 24  GLUCOSE 206*  BUN 17  CREATININE 0.63  CALCIUM 8.9    Intake/Output Summary (Last 24 hours) at 11/19/2021 1223 Last data filed at 11/19/2021 1771 Gross per 24 hour  Intake 236 ml  Output 2550 ml  Net -2314 ml        Physical Exam: Vital Signs Blood pressure 117/81, pulse (!) 57, temperature 97.9 F (36.6 C), temperature source Oral, resp. rate 18, height '5\' 11"'$  (1.803 m), weight 131 kg, last menstrual period 09/29/2020, SpO2 98 %.     General: awake, alert, appropriate, sitting up in bed reading;  NAD HENT: conjugate gaze; oropharynx moist CV: regular rate/borderline bradycardic; no JVD Pulmonary: CTA B/L; no W/R/R- good air movement GI: soft, NT, ND, (+)BS Psychiatric: appropriate Neurological:  Musculoskeletal:        General: No swelling.     Comments: Right heel cord is tight  Neurological:     Mental Status: She is alert.     Motor: Weakness present.     Comments: Alert and oriented x 3. Normal insight and awareness. Intact Memory. Normal language and speech. Cranial nerve exam unremarkable for mild right central 7. RUE 0/5 deltoids, 2+ to 3/5 biceps, triceps  and 4-5/5 wrist and fingers. RLE: 1/5 HF/HAB and 0/5 elsewhere. DTR's 1+. No resting tone. Sensed pain and light touch in all 4 limbs.     Assessment/Plan: 1. Functional deficits which require 3+ hours per day of interdisciplinary therapy in a comprehensive inpatient rehab  setting. Physiatrist is providing close team supervision and 24 hour management of active medical problems listed below. Physiatrist and rehab team continue to assess barriers to discharge/monitor patient progress toward functional and medical goals  Care Tool:  Bathing    Body parts bathed by patient: Right arm, Left arm, Chest, Abdomen, Front perineal area, Right upper leg, Left upper leg   Body parts bathed by helper: Buttocks, Right lower leg, Left lower leg     Bathing assist Assist Level: Maximal Assistance - Patient 24 - 49%     Upper Body Dressing/Undressing Upper body dressing   What is the patient wearing?: Dress    Upper body assist Assist Level: Set up assist    Lower Body Dressing/Undressing Lower body dressing      What is the patient wearing?: Underwear/pull up     Lower body assist Assist for lower body dressing: Maximal Assistance - Patient 25 - 49%     Toileting Toileting    Toileting assist Assist for toileting: Dependent - Patient 0% (stedy)     Transfers Chair/bed transfer  Transfers  assist     Chair/bed transfer assist level: Moderate Assistance - Patient 50 - 74%     Locomotion Ambulation   Ambulation assist   Ambulation activity did not occur: Safety/medical concerns (0/5 R LE MMT)          Walk 10 feet activity   Assist  Walk 10 feet activity did not occur: Safety/medical concerns (0/5 R LE MMT)        Walk 50 feet activity   Assist Walk 50 feet with 2 turns activity did not occur: Safety/medical concerns (0/5 R LE MMT)         Walk 150 feet activity   Assist Walk 150 feet activity did not occur: Safety/medical concerns (0/5 R LE MMT)         Walk 10 feet on uneven surface  activity   Assist Walk 10 feet on uneven surfaces activity did not occur: Safety/medical concerns (0/5 R LE MMT)         Wheelchair     Assist               Wheelchair 50 feet with 2 turns  activity    Assist            Wheelchair 150 feet activity     Assist          Blood pressure 117/81, pulse (!) 57, temperature 97.9 F (36.6 C), temperature source Oral, resp. rate 18, height '5\' 11"'$  (1.803 m), weight 131 kg, last menstrual period 09/29/2020, SpO2 98 %.    Medical Problem List and Plan: 1. Functional deficits secondary to left posterior frontal lobe metastatic breast cancer, s/p resection with right-sided weakness             -patient may shower             -ELOS/Goals: 14-20 days, min assist goals with PT/OT and mod I with SLP             -keep RUE supported given proximal muscle weakness.              -PRAFO to help support/stretch right heel cord. Consider night splint  5/28- con't CIR- PT and OT 2.  Antithrombotics: -DVT/anticoagulation:  Mechanical:  Antiembolism stockings, knee (TED hose) Bilateral lower extremities Sequential compression devices, below knee Bilateral lower extremities             -NS is ok with starting lovenox '30mg'$  q12--begin today             -check dopplers 5/27- Dopplers pending- on Lovenox 30 mg q12 hours             -antiplatelet therapy: none 3. Pain Management: Tylenol, Norco as needed  5/28- pain controlled- con't regimen 4. Mood: LCSW to evaluate and provide emotional support             -antipsychotic agents: n/a 5. Neuropsych: This patient is capable of making decisions on her own behalf. 6. Skin/Wound Care: Routine skin care checks             -- monitor surgical incision 7. Fluids/Electrolytes/Nutrition: Routine Is and Os and follow-up chemistries 8: Recurrent metastatic breast cancer with brain lesion s/p resection             --continue Decadron 4 mg q 6 hours             --continue Keppra 500 mg q 12 hours for seizure prophylaxis 9: GERD: continue Protonix 10: Constipation: getting Miralax  daily and Dulcolax supp PRN             --consider milk of magnesia/Fleets if needed             -last bm 5/24  5/27-  pt reports last "decent BM" was 5/16- so will give Sorbitol 60cc after therapy. Will see if can use BSC since will have been seen by therapy at that point.   5/28- 2 large BM's yesterday- con't regimen- use sorbitol prn 11: Sensitivity to Arm and Hammer laundry products; is not allergic to baking powder 12: Intermittent elevated BP: continue to monitor --continue clonidine 0.1 mg BID. Consider TID dosing more consistent control 13: Morbid obesity: BMI 43.97; dietary counseling  14. Insomnia  5/27- not sleeping well- will try melatonin 5 mg QHS since doesn't want "meds".   5/28- will change melatonin to prn per pt wishes  15. Leukocytosis  5/27- WBC 15.9- but since last labs, have new Decadron 4 mg q6 hours- afebrile- no signs/Sx's of illness- will recheck Monday and monitor 16. Hyperglycemia  5/28- will start sensitive SSI and CBGs since had BG of 207 on BMP yesterday  not diabetic, but due to Decadron.    I spent a total of 35   minutes on total care today- >50% coordination of care- due to d/w nursing about CBGs and plan      LOS: 2 days A FACE TO FACE EVALUATION WAS PERFORMED  Severin Bou 11/19/2021, 12:23 PM

## 2021-11-20 DIAGNOSIS — R739 Hyperglycemia, unspecified: Secondary | ICD-10-CM

## 2021-11-20 LAB — CBC WITH DIFFERENTIAL/PLATELET
Abs Immature Granulocytes: 0.34 10*3/uL — ABNORMAL HIGH (ref 0.00–0.07)
Basophils Absolute: 0 10*3/uL (ref 0.0–0.1)
Basophils Relative: 0 %
Eosinophils Absolute: 0 10*3/uL (ref 0.0–0.5)
Eosinophils Relative: 0 %
HCT: 41 % (ref 36.0–46.0)
Hemoglobin: 13.9 g/dL (ref 12.0–15.0)
Immature Granulocytes: 2 %
Lymphocytes Relative: 13 %
Lymphs Abs: 2.1 10*3/uL (ref 0.7–4.0)
MCH: 27.8 pg (ref 26.0–34.0)
MCHC: 33.9 g/dL (ref 30.0–36.0)
MCV: 82 fL (ref 80.0–100.0)
Monocytes Absolute: 0.8 10*3/uL (ref 0.1–1.0)
Monocytes Relative: 5 %
Neutro Abs: 13.2 10*3/uL — ABNORMAL HIGH (ref 1.7–7.7)
Neutrophils Relative %: 80 %
Platelets: 273 10*3/uL (ref 150–400)
RBC: 5 MIL/uL (ref 3.87–5.11)
RDW: 13.7 % (ref 11.5–15.5)
WBC: 16.5 10*3/uL — ABNORMAL HIGH (ref 4.0–10.5)
nRBC: 0 % (ref 0.0–0.2)

## 2021-11-20 LAB — GLUCOSE, CAPILLARY
Glucose-Capillary: 195 mg/dL — ABNORMAL HIGH (ref 70–99)
Glucose-Capillary: 281 mg/dL — ABNORMAL HIGH (ref 70–99)

## 2021-11-20 MED ORDER — DEXAMETHASONE 4 MG PO TABS
4.0000 mg | ORAL_TABLET | Freq: Three times a day (TID) | ORAL | Status: DC
Start: 2021-11-20 — End: 2021-11-28
  Administered 2021-11-20 – 2021-11-28 (×24): 4 mg via ORAL
  Filled 2021-11-20 (×24): qty 1

## 2021-11-20 NOTE — IPOC Note (Signed)
Overall Plan of Care [facility]) Patient Details Name: Sheryl Mcmillan MRN: [id number] DOB: [birth year]  Admitting Diagnosis: Brain tumor [facility])  Hospital Problems: Principal Problem:   Brain tumor [facility])     Functional Problem List: Nursing Bladder, Edema, Endurance, Medication Management, Motor, Pain, Safety, Skin Integrity  PT Balance, Endurance, Motor  OT Balance, Endurance, Motor, Pain, Safety  SLP  (N/A)  TR         Basic ADL's: OT Eating, Grooming, Bathing, Dressing, Toileting     Advanced  ADL's: OT Simple Meal Preparation     Transfers: PT Bed to Chair, Car  OT Toilet, Tub/Shower     Locomotion: PT Ambulation, Stairs     Additional Impairments: OT Fuctional Use of Upper Extremity  SLP  (N/A)      TR      Anticipated Outcomes Item Anticipated Outcome  Self Feeding S  Swallowing  N/A   Basic self-care  S  Toileting  S   Bathroom Transfers S  Bowel/Bladder  min assist  Transfers  Academic librarian  N/A  Cognition  N/A  Pain  < 3  Safety/Judgment  min assist   Therapy Plan: PT Intensity: Minimum of 1-2 x/day ,45 to 90 minutes PT Frequency: 5 out of 7 days PT Duration Estimated Length of Stay: 14-21 days OT Intensity: Minimum of 1-2 x/day, 45 to 90 minutes OT Frequency: 5 out of 7 days OT Duration/Estimated Length of Stay: 16-20 days SLP Intensity:  (N/A) SLP Frequency:  (N/A) SLP Duration/Estimated Length of Stay: N/A for ST intervention   Team Interventions: Nursing Interventions Patient/Family Education, Bladder Management, Disease Management/Prevention, Pain Management, Medication Management, Skin Care/Wound Management, Discharge Planning, Cognitive Remediation/Compensation  PT interventions Ambulation/gait training, Training and development officer, Community reintegration, Discharge planning, Disease management/prevention, DME/adaptive equipment instruction, Functional mobility training, Functional  electrical stimulation, Neuromuscular re-education, Patient/family education, Pain management, Psychosocial support, Stair training, Therapeutic Activities, Therapeutic Exercise, UE/LE Strength taining/ROM, UE/LE Coordination activities, Wheelchair propulsion/positioning  OT Interventions Balance/vestibular training, Discharge planning, Functional electrical stimulation, Pain management, Self Care/advanced ADL retraining, Therapeutic Activities, UE/LE Coordination activities, Visual/perceptual remediation/compensation, Therapeutic Exercise, Skin care/wound managment, Patient/family education, Functional mobility training, Disease mangement/prevention, Cognitive remediation/compensation, Community reintegration, Neuromuscular re-education, DME/adaptive equipment instruction, Psychosocial support, Splinting/orthotics, UE/LE Strength taining/ROM, Wheelchair propulsion/positioning  SLP Interventions  (N/A)  TR Interventions    SW/CM Interventions     Barriers to Discharge MD  Medical stability  Nursing Decreased caregiver support, Home environment access/layout, Incontinence, Wound Care, Lack of/limited family support, Pending chemo/radiation 1 level, 3 steps, no rails. Daughter to provide care with assist from friends.  PT Home environment access/layout stairs to enter  OT Inaccessible home environment, Home environment access/layout, Weight    SLP      SW       Team Discharge Planning: Destination: PT-Home ,OT- Home , SLP-  Projected Follow-up: PT-Home health PT, OT-  Home health OT, SLP-  Projected Equipment Needs: PT-To be determined, OT- Tub/shower bench, To be determined, SLP-  Equipment Details: PT- , OT-  Patient/family involved in discharge planning: PT- Patient,  OT-Patient, SLP-   MD ELOS: 15-20 days Medical Rehab Prognosis:  Excellent Assessment: The patient has been admitted for CIR therapies with the diagnosis of metastatic breast cancer to the brain s/p resection. The team will  be addressing functional mobility, strength, stamina, balance, safety, adaptive techniques and equipment, self-care, bowel and bladder mgt, patient and caregiver education, NMR, visual-spatial awareness, community reentry. Goals have been set  at supervision to min assist with mobility and self-care. Anticipated discharge destination is home with family.        See Team Conference Notes for weekly updates to the plan of care

## 2021-11-20 NOTE — Progress Notes (Signed)
PROGRESS NOTE   Subjective/Complaints:  Moved bowels and feels better. Happy to get in the shower!  ROS: Patient denies fever, rash, sore throat, blurred vision, dizziness, nausea, vomiting, diarrhea, cough, shortness of breath or chest pain, joint or back/neck pain, headache, or mood change.   Objective:   No results found. Recent Labs    11/18/21 0526 11/20/21 0623  WBC 15.9* 16.5*  HGB 13.8 13.9  HCT 41.0 41.0  PLT 261 273   Recent Labs    11/18/21 0526  NA 136  K 4.2  CL 103  CO2 24  GLUCOSE 206*  BUN 17  CREATININE 0.63  CALCIUM 8.9    Intake/Output Summary (Last 24 hours) at 11/20/2021 1131 Last data filed at 11/20/2021 0430 Gross per 24 hour  Intake 236 ml  Output 1750 ml  Net -1514 ml        Physical Exam: Vital Signs Blood pressure 121/78, pulse 64, temperature 97.9 F (36.6 C), resp. rate 18, height '5\' 11"'$  (1.803 m), weight 131 kg, last menstrual period 09/29/2020, SpO2 100 %.     Constitutional: No distress . Vital signs reviewed. HEENT: NCAT, EOMI, oral membranes moist Neck: supple Cardiovascular: RRR without murmur. No JVD    Respiratory/Chest: CTA Bilaterally without wheezes or rales. Normal effort    GI/Abdomen: BS +, non-tender, non-distended Ext: no clubbing, cyanosis, or edema Psych: pleasant and cooperative  Skin: incision CDI on scalp Musculoskeletal:        General: No swelling.     Comments: Right heel cord is tight  Neurological:     Mental Status: She is alert.     Motor: Weakness present.     Comments: Alert and oriented x 3. Normal insight and awareness. Intact Memory. Normal language and speech. Cranial nerve exam unremarkable for mild right central 7 still present. RUE 0/5 deltoids, 2+ to 3/5 biceps, triceps  and 4-5/5 wrist and fingers. RLE: 1/5 HF/HAB and 0/5 elsewhere. DTR's 1+. Sensory exam normal for light touch and pain in all 4 limbs. No limb ataxia or  cerebellar signs. No abnormal tone appreciated.      Assessment/Plan: 1. Functional deficits which require 3+ hours per day of interdisciplinary therapy in a comprehensive inpatient rehab setting. Physiatrist is providing close team supervision and 24 hour management of active medical problems listed below. Physiatrist and rehab team continue to assess barriers to discharge/monitor patient progress toward functional and medical goals  Care Tool:  Bathing    Body parts bathed by patient: Right arm, Left arm, Chest, Abdomen, Front perineal area, Right upper leg, Left upper leg   Body parts bathed by helper: Buttocks, Right lower leg, Left lower leg     Bathing assist Assist Level: Maximal Assistance - Patient 24 - 49%     Upper Body Dressing/Undressing Upper body dressing   What is the patient wearing?: Dress    Upper body assist Assist Level: Set up assist    Lower Body Dressing/Undressing Lower body dressing      What is the patient wearing?: Underwear/pull up     Lower body assist Assist for lower body dressing: Maximal Assistance - Patient 25 - 49%  Toileting Toileting    Toileting assist Assist for toileting: Dependent - Patient 0% (stedy)     Transfers Chair/bed transfer  Transfers assist     Chair/bed transfer assist level: Moderate Assistance - Patient 50 - 74%     Locomotion Ambulation   Ambulation assist   Ambulation activity did not occur: Safety/medical concerns (0/5 R LE MMT)          Walk 10 feet activity   Assist  Walk 10 feet activity did not occur: Safety/medical concerns (0/5 R LE MMT)        Walk 50 feet activity   Assist Walk 50 feet with 2 turns activity did not occur: Safety/medical concerns (0/5 R LE MMT)         Walk 150 feet activity   Assist Walk 150 feet activity did not occur: Safety/medical concerns (0/5 R LE MMT)         Walk 10 feet on uneven surface  activity   Assist Walk 10 feet on uneven  surfaces activity did not occur: Safety/medical concerns (0/5 R LE MMT)         Wheelchair     Assist               Wheelchair 50 feet with 2 turns activity    Assist            Wheelchair 150 feet activity     Assist          Blood pressure 121/78, pulse 64, temperature 97.9 F (36.6 C), resp. rate 18, height '5\' 11"'$  (1.803 m), weight 131 kg, last menstrual period 09/29/2020, SpO2 100 %.    Medical Problem List and Plan: 1. Functional deficits secondary to left posterior frontal lobe metastatic breast cancer, s/p resection with right-sided weakness             -patient may shower             -ELOS/Goals: 14-20 days, min assist goals with PT/OT and mod I with SLP             -keep RUE supported given proximal muscle weakness.              -PRAFO to help support/stretch right heel cord. Consider night splint  -Continue CIR therapies including PT, OT, SLP 2.  Antithrombotics: -DVT/anticoagulation:  Mechanical:  Antiembolism stockings, knee (TED hose) Bilateral lower extremities Sequential compression devices, below knee Bilateral lower extremities             -check dopplers 5/29- Dopplers still pending- on Lovenox 30 mg q12 hours             -antiplatelet therapy: none 3. Pain Management: Tylenol, Norco as needed  5/29 - pain controlled- con't regimen 4. Mood: LCSW to evaluate and provide emotional support             -antipsychotic agents: n/a 5. Neuropsych: This patient is capable of making decisions on her own behalf. 6. Skin/Wound Care: Routine skin care checks             -- monitor surgical incision 7. 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 Will see if can use BSC since will have been seen by therapy at that point.   5/29: 2 large BM's 5/27- con't regimen- use sorbitol prn 11: Sensitivity to Arm and Hammer laundry products; is not allergic to baking powder 12: Intermittent elevated BP: continue to monitor --continue clonidine 0.1 mg BID. Consider TID dosing more consistent control 13: Morbid obesity: BMI 43.97; dietary counseling  14. Insomnia  5/27- not sleeping well- will try melatonin 5 mg QHS since doesn't want "meds".   5/29 melatonin per pt wishes. Slept ok last nigth  15. Leukocytosis  5/27- WBC 15.9- but since last labs, have new Decadron 4 mg q6 hours- afebrile- no signs/Sx's of illness- will recheck Monday and monitor 16. Hyperglycemia  5/28- started sensitive SSI and CBGs since had BG of 207 on BMP yesterday.  5/29- discussed how decadron will affects cbg's. Hopefully with taper we'll see improvement. Sugars still high this morning          LOS: 3 days A FACE TO FACE EVALUATION WAS PERFORMED  Meredith Staggers 11/20/2021, 11:31 AM

## 2021-11-20 NOTE — Progress Notes (Signed)
Physical Therapy Session Note  Patient Details  Name: Sheryl Mcmillan MRN: [id number] Date of Birth: 01/04/1976  Today's Date: 11/20/2021 PT Individual Time: 1422-1515 PT Individual Time Calculation (min): 53 min   Short Term Goals: Week 1:  PT Short Term Goal 1 (Week 1): Pt will transfer bed<>chair with CGA PT Short Term Goal 2 (Week 1): Pt will complete sit to stand from chair height with MinA + RW PT Short Term Goal 3 (Week 1): Pt will complete amb of 10 ft with ModA + RW  Skilled Therapeutic Interventions/Progress Updates:    Am session - Pt w/nursing for toileting.  PM session - Pt agreeable to short session, upset that am session was rescheduled by therapist due to toileting.  Explained difficulty for therapy staff to flex schedules, pt agreeable to 45 min session citing visit w/daughter this pm.  Pt supine to sit w/supervision.  Lateral scoot to wc w/min assist.  Mod I wc propulsion x 175f to main gym/parallel bars.  In parallel bars session w/focus on pregait, standing balance, wbing thru RLE. Sit to stand in bars w/mod assist initially but fades to cga by last standing activity.  In standing worked first on static stand w/midline posture of hips, max assist to stabilize R ankle due to strong inversion tendency. Worked on protraction of R hip to neutrral, maintaining during functional reach - this decreased inv tendency of R ankle/mod assist to prevent. Worked on cross body reaching L hand to R side to promote wbing thru RLE, therapist blocks ankle and knee to prevent buckling.  Added alternating overhead reach, same assist provided to RLE stability.  Pregait: Worked on single step forward/reverse w/LLE w/bilat UE support, therapist stabiliing R knee and ankle, and cues to maintain neutral pelvis/prevent R retraction.  Repeated this x 3 min, 4 sets total, occasionally decreased assist at knee but pt unable to control buckling majority of time, when she did it was difficulty to  determine if due to quad activation vs glut activation.  Wc propulsion 1548fw/R exts mod I to room.  Nursing states pt does not need alarm belt.  Pt left oob in wc w/daughter in room and needs in reach.  NT aware.    Would benefit from bracing of R ankle to prevent inversion injury (pt has appropriate shoes) and possibly Litegait for initiation of gait training.  Therapy Documentation Precautions:  Precautions Precautions: Fall Restrictions Weight Bearing Restrictions: No General:     Therapy/Group: Individual Therapy BaCallie FieldingPTNoble/29/2023, 3:39 PM

## 2021-11-20 NOTE — Progress Notes (Signed)
Occupational Therapy Session Note  Patient Details  Name: Sheryl Mcmillan MRN: [id number] Date of Birth: [birth year]  Today's Date: 11/20/2021 OT Individual Time: 1712-7871 OT Individual Time Calculation (min): 45 min   Short Term Goals: Week 1:  OT Short Term Goal 1 (Week 1): Pt will consistently transfer to toilet wiht MIN A and LRAD OT Short Term Goal 2 (Week 1): Pt will bathe 8/10 body parts with AE PRN OT Short Term Goal 3 (Week 1): Pt will recall hemi dressing techiques with no cuing OT Short Term Goal 4 (Week 1): Pt will complete 1/3 components of toileting  Skilled Therapeutic Interventions/Progress Updates:    Pt greeted semi-reclined in bed and agreeable to OT treatment session focused on R UE NMR and sit<>stands. Stedy used to transfer pt from bed to wc with mod A to get to standing in [place]. Pt able to propel wc using hemi techniques and supervision. Sit<>stand at the high-low table mod A and R knee block. While in standing, utilized towel pushes up wedge. Pt completed 3 standing sets of 20. Min/mod A for standing balance with R knee block and facilitation for weight shift onto R. Pt returned to sitting and continued working on R UE NMR with shoulder horizontal adduction/abduction across table. 2 sets of 10 chest press with 1 lb dowel rod and OT assist to keep elbows up while pushing bar forward. Pt propelled wc back to room and pt left seated in wc with call bell in reach and needs met.   Therapy Documentation Precautions:  Precautions Precautions: Fall Restrictions Weight Bearing Restrictions: No Pain:  Denies pain   Therapy/Group: Individual Therapy  Valma Cava 11/20/2021, 12:22 PM

## 2021-11-20 NOTE — Progress Notes (Signed)
Occupational Therapy Session Note  Patient Details  Name: Sheryl Mcmillan MRN: [id number] Date of Birth: 1976/02/28  Today's Date: 11/20/2021 OT Individual Time: 8242-3536 OT Individual Time Calculation (min): 70 min    Short Term Goals: Week 1:  OT Short Term Goal 1 (Week 1): Pt will consistently transfer to toilet wiht MIN A and LRAD OT Short Term Goal 2 (Week 1): Pt will bathe 8/10 body parts with AE PRN OT Short Term Goal 3 (Week 1): Pt will recall hemi dressing techiques with no cuing OT Short Term Goal 4 (Week 1): Pt will complete 1/3 components of toileting  Skilled Therapeutic Interventions/Progress Updates:    Subjective: Pt states she is really hoping to get in the shower during this session.  No c/o pain throughout session.    Objective:  Pt semi reclined in bed. Supine to sit with CGA using bed features.  Sit<>stand at stedy with CGA and dependent transport to shower bench.  Pt was already unclothed upon OT arrival therefore only needed to donn shower cap to keep incision dry.  Bathed UB with supervision and LB with close supervision (not including buttocks) using long handled sponge.  Sit<>stand at stedy with CGA and transported to sink where she donned deoderant and her dress with close supervision standing in stedy.  Sit<>stand with CGA using stedy to w/c and brushed teeth/combed hair with setup.  Pt self propelled w/c to dayroom ~25 feet and completed right shoulder strengthening including weightless dowel flexion AAROM and red medium resist tband ER 2 x 15 reps each.  Self propelled back to room~.  Call bell and all needs in reach and nurse tech made aware of pts location up in w/c.     Assessment:  Pt making progress evidenced byincreased independence during sit<>stand at stedy from mod assist to CGA able to control eccentric lowering better today since IE.     Plan: Pt would benefit from further training on RUE strengthening.   Therapy Documentation Precautions:   Precautions Precautions: Fall Restrictions Weight Bearing Restrictions: No    Therapy/Group: Individual Therapy  Sheryl Mcmillan 11/20/2021, 12:17 PM

## 2021-11-21 ENCOUNTER — Inpatient Hospital Stay (HOSPITAL_COMMUNITY): Payer: Medicaid Other

## 2021-11-21 DIAGNOSIS — D72829 Elevated white blood cell count, unspecified: Secondary | ICD-10-CM

## 2021-11-21 DIAGNOSIS — M7989 Other specified soft tissue disorders: Secondary | ICD-10-CM

## 2021-11-21 LAB — GLUCOSE, CAPILLARY
Glucose-Capillary: 220 mg/dL — ABNORMAL HIGH (ref 70–99)
Glucose-Capillary: 157 mg/dL — ABNORMAL HIGH (ref 70–99)
Glucose-Capillary: 180 mg/dL — ABNORMAL HIGH (ref 70–99)
Glucose-Capillary: 367 mg/dL — ABNORMAL HIGH (ref 70–99)

## 2021-11-21 MED ORDER — METFORMIN HCL 500 MG PO TABS
500.0000 mg | ORAL_TABLET | Freq: Every day | ORAL | Status: DC
  Administered 2021-11-22: 500 mg via ORAL
  Filled 2021-11-21: qty 1

## 2021-11-21 NOTE — Progress Notes (Signed)
Physical Therapy Session Note  Patient Details  Name: Sheryl Mcmillan MRN: [id number] Date of Birth: September 01, 1975  Today's Date: 11/21/2021 PT Individual Time: 0947-0962 and 1100-1155 PT Individual Time Calculation (min): 25 min and 55 min  Short Term Goals: Week 1:  PT Short Term Goal 1 (Week 1): Pt will transfer bed<>chair with CGA PT Short Term Goal 2 (Week 1): Pt will complete sit to stand from chair height with MinA + RW PT Short Term Goal 3 (Week 1): Pt will complete amb of 10 ft with ModA + RW  Skilled Therapeutic Interventions/Progress Updates:   Treatment Session 1 Received pt semi-reclined in bed, pt agreeable to PT treatment, and denied any pain during session. Session with emphasis on functional mobility/transfers and generalized strengthening and endurance. Donned socks with set up assist and pt transferred semi-reclined<>sitting EOB with HOB elevated and use of bedrails with supervision. Trialed 2 different AFOs for RLE, however pt's current shoes were too small - pt reported her daughter will bring in another pair later today. Donned shoes with max A on RLE and set up assist on LLE and pt transferred bed<>WC via lateral scoot to L with CGA - cues to bear weight through RLE instead of dragging it along. Pt sat in [facility] and brushed teeth/washed face at sink with set up assist. Concluded session with pt sitting in WC at sink with all needs within reach and NT present at bedside.   Treatment Session 2 Received pt sitting in WC, pt agreeable to PT treatment, and denied any pain during session. Session with emphasis on functional mobility/transfers, generalized strengthening and endurance, and dynamic standing balance/coordination. Pt performed WC mobility 146f x 2 trials using R hemi technique and supervision to/from main therapy gym. Worked on sit<>stands in // bars x 5 reps with mod A fading to min A with therapist blocking R knee. Of note pt with tendency to buckle more when sitting  compared to in stance. Pt transferred WC<>mat via lateral scoot to R with heavy min A via multiple mini scoots - cues for technique and encouraged use of RLE. Pt then performed heel slides AAROM on RLE 2x5 - noted increase activation at end range, then hip adduction ball squeezes 2x10 with 5 second isometric hold with emphasis on hip adductor strengthening. Transitioned to blocked practice sit<>stands with RUE around therapist's shoulder x4 reps with therapist blocking R knee from buckling. Pt transferred mat<>WC to L with CGA via multiple little scoots. [facility]   Therapy Documentation Precautions:  Precautions Precautions: Fall Restrictions Weight Bearing Restrictions: No  Therapy/Group: Individual Therapy AAlfonse AlpersPT, DPT  11/21/2021, 6:49 AM

## 2021-11-21 NOTE — Progress Notes (Signed)
Inpatient [facility] Individual Statement of Services  Patient Name:  Sheryl Mcmillan  Date:  11/21/2021  Welcome to the [facility].  Our goal is to provide you with an individualized program based on your diagnosis and situation, designed to meet your specific needs.  With this comprehensive rehabilitation program, you will be expected to participate in at least 3 hours of rehabilitation therapies Monday-Friday, with modified therapy programming on the weekends.  Your rehabilitation program will include the following services:  Physical Therapy (PT), Occupational Therapy (OT), Speech Therapy (ST), 24 hour per day rehabilitation nursing, Therapeutic Recreaction (TR), Neuropsychology, Care Coordinator, Rehabilitation Medicine, Nutrition Services, Pharmacy Services, and Other  Weekly team conferences will be held on Wednesdays to discuss your progress.  Your Inpatient Rehabilitation Care Coordinator will talk with you frequently to get your input and to update you on team discussions.  Team conferences with you and your family in attendance may also be held.  Expected length of stay:  14-20 Days   Overall anticipated outcome:  Supervision at Wheelchair Level  Depending on your progress and recovery, your program may change. Your Inpatient Rehabilitation Care Coordinator will coordinate services and will keep you informed of any changes. Your Inpatient Rehabilitation Care Coordinator's name and contact numbers are listed  below.  The following services may also be recommended but are not provided by the [place] [facility]:   [place] will be made to provide these services after discharge if needed.  Arrangements include referral to agencies that provide these services.  Your insurance has been verified to be:    MEDICAID  Your primary doctor is:  Windell Hummingbird,  PA-C  Pertinent information will be shared with your doctor and your insurance company.  Inpatient Rehabilitation Care Coordinator:  Erlene Quan, [place] or [phone]  Information discussed with and copy given to patient by: Dyanne Iha, 11/21/2021, 12:41 PM

## 2021-11-21 NOTE — Progress Notes (Signed)
Occupational Therapy Session Note  Patient Details  Name: Sheryl Mcmillan MRN: [id number] Date of Birth: 09-17-1975  Today's Date: 11/21/2021 OT Individual Time: 6644-0347 & 1300-1355 OT Individual Time Calculation (min): 59 min & 55 min   Short Term Goals: Week 1:  OT Short Term Goal 1 (Week 1): Pt will consistently transfer to toilet wiht MIN A and LRAD OT Short Term Goal 2 (Week 1): Pt will bathe 8/10 body parts with AE PRN OT Short Term Goal 3 (Week 1): Pt will recall hemi dressing techiques with no cuing OT Short Term Goal 4 (Week 1): Pt will complete 1/3 components of toileting  Skilled Therapeutic Interventions/Progress Updates:  Session 1 Skilled OT intervention completed with focus on functional standing tolerance and A/AA/ROM of RUE. Pt received seated in w/c, with nursing administering meds. Pt with no c/o pain, agreeable to session. Self-propelled in w/c using hemi-technique <> gym with supervision. At high low table, pt participated in the following to promote standing endurance and AROM reaching of RUE needed for ADL tasks:  Sit > stand using L hand to push up from w/c, R hand to stabilize on high table, with therapist blocking at R knee and mod A needed for boosting. Unstacked 10 cones into scattered pile using RUE then assembling into one stack. Seated rest break with min A needed for descent. Stood at same assist as above then maintained stance during assembling/removing clips onto 3 horizontal bars, with pt needing cues to weight shift > R with knee extension facilitory cues provided as pt relying on physical knee blocking if un-cued.   Seated at table, pt completed nine hole peg test with the following results: L hand: 25 sec R hand: 40.23 sec  Followed by assembling/removing clips on horizontal bar for increased shoulder AROM flexion, with pt demonstrating compensatory shoulder shrug with education provided about lifting at shoulder as much as possible then using L hand to  stabilize under elbow for increased ROM. Completed medication management to promote fine motor coordination on R hand, with pt able to manipulate container lids, beads with min difficulty however often dropping items. Pt donned gloves with set up A, then assisted in wiping down beads and cleaning up. Pt was left seated in w/c, with chair alarm on and all needs in reach at end of session.   Session 2 Skilled OT intervention completed with focus on functional endurance via shower and fine/gross motor strength/control. Pt received seated in w/c, requesting to take a shower this session. Doffed gown with supervision. Donned shower cap for waterproof coverage over cranial staples. Completed sit > stand in stedy with min A, cues for using shin plate to prevent knee buckling. Dependent transfer to TTB, then min A for lowering to bench. Completed bathing with min A for buttocks, however supervision for rest of body with use of LH sponge. Sit > stand in stedy with min A from tub bench then dependent transfer with supervision for sitting balance during transfer. Pt preferring to return to bed, without clothes (per preference as she prefers being nude at home too), with min A needed for RLE management 2/2 weakness with hip flexors and AROM of RLE. Pt able to reposition self in bed with supervision, with therapist raising R bed rail and placing pillows in RLE to prevent external rotation at hip and abduction for neutral alignment. Issued pt yellow theraputty and HEP for fine motor tasks with pt able to use R hand to manipulate putty into a ball, snake,  pinch along the border. Vascular team in room and direct care handed off with all immediate needs met at therapist departure.   Therapy Documentation Precautions:  Precautions Precautions: Fall Restrictions Weight Bearing Restrictions: No    Therapy/Group: Individual Therapy  Zaneta Lightcap E Floreen Teegarden 11/21/2021, 7:25 AM

## 2021-11-21 NOTE — Progress Notes (Signed)
Inpatient Rehabilitation Care Coordinator Assessment and Plan Patient Details  Name: Sheryl Mcmillan MRN: [id number] Date of Birth: Jun 21, 1976  Today's Date: 11/21/2021  Hospital Problems: Principal Problem:   Brain tumor [facility])  Past Medical History:  Past Medical History:  Diagnosis Date   Arthritis    Breast cancer ([place])    Family history of non-Hodgkin's lymphoma 06/01/2020   GERD (gastroesophageal reflux disease)    Hemorrhoids    Migraines    PCOS (polycystic ovarian syndrome)    PONV (postoperative nausea and vomiting)    Past Surgical History:  Past Surgical History:  Procedure Laterality Date   APPLICATION OF CRANIAL NAVIGATION Left 11/10/2021   Procedure: APPLICATION OF CRANIAL NAVIGATION;  Surgeon: Consuella Lose, MD;  Location: [place];  Service: Neurosurgery;  Laterality: Left;   AXILLARY SENTINEL NODE BIOPSY Left 11/07/2020   Procedure: LEFT AXILLARY SENTINEL NODE BIOPSY;  Surgeon: [name], MD;  Location: [place];  Service: General;  Laterality: Left;   BREAST CYST EXCISION Right 09/22/2021   Procedure: Excision of right axillary excess soft tissue.;  Surgeon: [name], MD;  Location: [place];  Service: Plastics;  Laterality: Right;   BREAST RECONSTRUCTION WITH PLACEMENT OF TISSUE EXPANDER AND FLEX HD (ACELLULAR HYDRATED DERMIS) Bilateral 11/07/2020   Procedure: BILATERAL BREAST RECONSTRUCTION WITH PLACEMENT OF TISSUE EXPANDER AND FLEX HD (ACELLULAR HYDRATED DERMIS);  Surgeon: [name], MD;  Location: [place];  Service: Plastics;  Laterality: Bilateral;   COSMETIC SURGERY     CRANIOTOMY Left 11/10/2021   Procedure: LT FRONTAL CRANIOTOMY TUMOR EXCISION;  Surgeon: Consuella Lose, MD;  Location: [place];  Service: Neurosurgery;  Laterality: Left;   DILATION AND CURETTAGE OF UTERUS     HEMORRHOID SURGERY     IR IMAGING GUIDED PORT INSERTION  06/15/2020   LIPOSUCTION WITH LIPOFILLING Bilateral 09/22/2021   Procedure: Fat grafting  bilateral breast;  Surgeon: [name], MD;  Location: [place];  Service: Plastics;  Laterality: Bilateral;   MASS EXCISION Bilateral 04/25/2021   Procedure: excision of bilateral axillary breast tissue;  Surgeon: [name], MD;  Location: [place];  Service: Plastics;  Laterality: Bilateral;   PORT-A-CATH REMOVAL Right 11/07/2020   Procedure: REMOVAL PORT-A-CATH;  Surgeon: [name], MD;  Location: [place];  Service: General;  Laterality: Right;   REMOVAL OF BILATERAL TISSUE EXPANDERS WITH PLACEMENT OF BILATERAL BREAST IMPLANTS Bilateral 04/25/2021   Procedure: REMOVAL OF BILATERAL TISSUE EXPANDERS WITH PLACEMENT OF BILATERAL BREAST IMPLANTS;  Surgeon: [name], MD;  Location: [place];  Service: Plastics;  Laterality: Bilateral;   ROBOTIC ASSISTED TOTAL HYSTERECTOMY WITH BILATERAL SALPINGO OOPHERECTOMY Bilateral 03/14/2021   Procedure: XI ROBOTIC ASSISTED TOTAL HYSTERECTOMY WITH BILATERAL SALPINGO OOPHORECTOMY;  Surgeon: Everitt Amber, MD;  Location: [facility];  Service: Gynecology;  Laterality: Bilateral;   TOTAL MASTECTOMY Bilateral 11/07/2020   Procedure: BILATERAL MASTECTOMY;  Surgeon: [name], MD;  Location: [place];  Service: General;  Laterality: Bilateral;   Social History:  reports that she quit smoking about 14 months ago. Her smoking use included cigarettes. She has a 5.00 pack-year smoking history. She has never used smokeless tobacco. She reports that she does not currently use alcohol. She reports current drug use. Drug: Marijuana.  Family / Support Systems Children: Ship broker (Daughter) Other Supports: Tammy Engineer, petroleum) Anticipated Caregiver: Daughter and and nurse friends Ability/Limitations of Caregiver: Daughter works as Chief Operating Officer. Begins school in August Caregiver Availability: 24/7 Family Dynamics: support from daughter,  family and friends  Social History Preferred language: English Religion:  Educational psychologist - How often do you need to have someone help you when you read instructions, pamphlets, or other written material from your doctor or pharmacy?: Never Writes: Yes Employment Status: Employed Name of Employer: CNA   Abuse/Neglect Abuse/Neglect Assessment Can Be Completed: Yes Physical Abuse: Denies Verbal Abuse: Denies Sexual Abuse: Denies Exploitation of patient/patient's resources: Denies Self-Neglect: Denies  Patient response to: Social Isolation - How often do you feel lonely or isolated from those around you?: Never  Emotional Status Recent Psychosocial Issues: coping Psychiatric History: n/a Substance Abuse History: n/a  Patient / Family Perceptions, Expectations & Goals Pt/Family understanding of illness & functional limitations: yes Premorbid pt/family roles/activities: previously independent, working and driving Anticipated changes in roles/activities/participation: daughter and friends able to assist patient at discharge Pt/family expectations/goals: Min A to Supervision at [place]: None Premorbid Home Care/DME Agencies: Other (Comment) (Bedside [employer], Hospital Bed, Civil engineer, contracting) Transportation available at discharge: family able to transport Is the patient able to respond to transportation needs?: Yes In the past 12 months, has lack of transportation kept you from medical appointments or from getting medications?: No In the past 12 months, has lack of transportation kept you from meetings, work, or from getting things needed for daily living?: No  Discharge Planning Living Arrangements: Children Support Systems: Children, Friends/neighbors Type of Residence: Private residence Insurance Resources: [employer] (specify county) Pensions consultant: Employment, Secondary school teacher Screen Referred: No Living Expenses: Education officer, community Management: Patient Does the patient have any problems obtaining your  medications?: No Home Management: independent Patient/Family Preliminary Plans: daughter and friends able to assist if needed Care Coordinator Barriers to Discharge: Lack of/limited family support, Decreased caregiver support, Insurance for SNF coverage Expected length of stay: 14-20 Days  Clinical Impression Sw met with patient, introduced self and explained role. Patient plans to discharge back home with assistance from daughter and friends. Patient lives in apartment and has steps to navigate. Sw will continue to follow up  Dyanne Iha 11/21/2021, 1:30 PM

## 2021-11-21 NOTE — Progress Notes (Signed)
PROGRESS NOTE   Subjective/Complaints: Discussed staple removal later this week Discussed elevated blood sugars, likely related to steroids, starting meformin while on steroids  ROS: Patient denies fever, rash, sore throat, blurred vision, dizziness, nausea, vomiting, diarrhea, cough, shortness of breath or chest pain, joint or back/neck pain, headache, or mood change.   Objective:   No results found. Recent Labs    11/20/21 0623  WBC 16.5*  HGB 13.9  HCT 41.0  PLT 273   No results for input(s): NA, K, CL, CO2, GLUCOSE, BUN, CREATININE, CALCIUM in the last 72 hours.   Intake/Output Summary (Last 24 hours) at 11/21/2021 1036 Last data filed at 11/21/2021 0757 Gross per 24 hour  Intake 830 ml  Output 200 ml  Net 630 ml        Physical Exam: Vital Signs Blood pressure (!) 134/95, pulse 69, temperature 98.2 F (36.8 C), temperature source Oral, resp. rate 16, height '5\' 11"'$  (1.803 m), weight 131 kg, last menstrual period 09/29/2020, SpO2 99 %. Constitutional: No distress . Vital signs reviewed. BMI 40.28 HEENT: NCAT, EOMI, oral membranes moist Neck: supple Cardiovascular: RRR without murmur. No JVD    Respiratory/Chest: CTA Bilaterally without wheezes or rales. Normal effort    GI/Abdomen: BS +, non-tender, non-distended Ext: no clubbing, cyanosis, or edema Psych: pleasant and cooperative  Skin: incision CDI on scalp Musculoskeletal:        General: No swelling.     Comments: Right heel cord is tight  Neurological:     Mental Status: She is alert.     Motor: Weakness present.     Comments: Alert and oriented x 3. Normal insight and awareness. Intact Memory. Normal language and speech. Cranial nerve exam unremarkable for mild right central 7 still present. RUE 0/5 deltoids, 2+ to 3/5 biceps, triceps  and 4-5/5 wrist and fingers. RLE: 1/5 HF/HAB and 0/5 elsewhere. DTR's 1+. Sensory exam normal for light touch and pain  in all 4 limbs. No limb ataxia or cerebellar signs. No abnormal tone appreciated.      Assessment/Plan: 1. Functional deficits which require 3+ hours per day of interdisciplinary therapy in a comprehensive inpatient rehab setting. Physiatrist is providing close team supervision and 24 hour management of active medical problems listed below. Physiatrist and rehab team continue to assess barriers to discharge/monitor patient progress toward functional and medical goals  Care Tool:  Bathing    Body parts bathed by patient: Right arm, Left arm, Chest, Abdomen, Front perineal area, Right upper leg, Left upper leg   Body parts bathed by helper: Buttocks, Right lower leg, Left lower leg     Bathing assist Assist Level: Maximal Assistance - Patient 24 - 49%     Upper Body Dressing/Undressing Upper body dressing   What is the patient wearing?: Dress    Upper body assist Assist Level: Set up assist    Lower Body Dressing/Undressing Lower body dressing      What is the patient wearing?: Underwear/pull up     Lower body assist Assist for lower body dressing: Maximal Assistance - Patient 25 - 49%     Toileting Toileting    Toileting assist Assist for toileting:  Dependent - Patient 0% (stedy)     Transfers Chair/bed transfer  Transfers assist     Chair/bed transfer assist level: Contact Guard/Touching assist (lateral scoot)     Locomotion Ambulation   Ambulation assist   Ambulation activity did not occur: Safety/medical concerns (0/5 R LE MMT)          Walk 10 feet activity   Assist  Walk 10 feet activity did not occur: Safety/medical concerns (0/5 R LE MMT)        Walk 50 feet activity   Assist Walk 50 feet with 2 turns activity did not occur: Safety/medical concerns (0/5 R LE MMT)         Walk 150 feet activity   Assist Walk 150 feet activity did not occur: Safety/medical concerns (0/5 R LE MMT)         Walk 10 feet on uneven surface   activity   Assist Walk 10 feet on uneven surfaces activity did not occur: Safety/medical concerns (0/5 R LE MMT)         Wheelchair     Assist               Wheelchair 50 feet with 2 turns activity    Assist            Wheelchair 150 feet activity     Assist          Blood pressure (!) 134/95, pulse 69, temperature 98.2 F (36.8 C), temperature source Oral, resp. rate 16, height '5\' 11"'$  (1.803 m), weight 131 kg, last menstrual period 09/29/2020, SpO2 99 %.    Medical Problem List and Plan: 1. Functional deficits secondary to left posterior frontal lobe metastatic breast cancer, s/p resection with right-sided weakness             -patient may shower             -ELOS/Goals: 14-20 days, min assist goals with PT/OT and mod I with SLP             -keep RUE supported given proximal muscle weakness.              -PRAFO to help support/stretch right heel cord. Consider night splint  Continue CIR therapies including PT, OT, SLP 2.  Antithrombotics: -DVT/anticoagulation:  Mechanical:  Antiembolism stockings, knee (TED hose) Bilateral lower extremities Sequential compression devices, below knee Bilateral lower extremities             -check dopplers 5/29- Dopplers still pending- on Lovenox 30 mg q12 hours             -antiplatelet therapy: none 3. Pain Management: Tylenol, Norco as needed  5/29 - pain controlled- con't regimen 4. Mood: LCSW to evaluate and provide emotional support             -antipsychotic agents: n/a 5. Neuropsych: This patient is capable of making decisions on her own behalf. 6. Skin/Wound Care: Routine skin care checks             -- monitor surgical incision 7. 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 Will see if can use BSC since will have been seen by therapy at that point.   5/29: 2 large BM's 5/27- con't regimen- use sorbitol prn 11: Sensitivity to [employer] laundry products; is not allergic to baking powder 12: Intermittent elevated BP: continue to monitor --continue clonidine 0.1 mg BID. Consider TID dosing more consistent control 13: Morbid obesity: BMI 43.97; dietary counseling  14. Insomnia: continue melatonin  15. Leukocytosis  5/27- WBC 15.9- but since last labs, have new Decadron 4 mg q6 hours- afebrile- no signs/Sx's of illness- will recheck Monday and monitor  5/29 WBC is 16.5- discussed with patient that this has been stable.  16. Hyperglycemia  5/28- started sensitive SSI and CBGs since had BG of 207 on BMP yesterday.  5/29- discussed how decadron will affects cbg's. Hopefully with taper we'll see improvement. Sugars still high this morning  5/30: start metformin '500mg'$  daily.           LOS: 4 days A FACE TO FACE EVALUATION WAS PERFORMED  Clide Deutscher Alie Moudy 11/21/2021, 10:36 AM

## 2021-11-21 NOTE — Progress Notes (Signed)
Inpatient Rehabilitation  Patient information reviewed and entered into eRehab system by Mattheo Swindle M. Luisenrique Conran, M.A., CCC/SLP, PPS Coordinator.  Information including medical coding, functional ability and quality indicators will be reviewed and updated through discharge.    

## 2021-11-22 LAB — GLUCOSE, CAPILLARY
Glucose-Capillary: 221 mg/dL — ABNORMAL HIGH (ref 70–99)
Glucose-Capillary: 173 mg/dL — ABNORMAL HIGH (ref 70–99)
Glucose-Capillary: 145 mg/dL — ABNORMAL HIGH (ref 70–99)
Glucose-Capillary: 225 mg/dL — ABNORMAL HIGH (ref 70–99)

## 2021-11-22 MED ORDER — METFORMIN HCL 500 MG PO TABS
500.0000 mg | ORAL_TABLET | Freq: Two times a day (BID) | ORAL | Status: DC
  Administered 2021-11-22 – 2021-11-30 (×16): 500 mg via ORAL
  Filled 2021-11-22 (×16): qty 1

## 2021-11-22 NOTE — Progress Notes (Signed)
Messaged Dr. Diamantina Monks regarding Decadron taper.

## 2021-11-22 NOTE — Progress Notes (Incomplete)
Inpatient Diabetes Program Recommendations  AACE/ADA: New Consensus Statement on Inpatient Glycemic Control (2015)  Target Ranges:  Prepandial:   less than 140 mg/dL      Peak postprandial:   less than 180 mg/dL (1-2 hours)      Critically ill patients:  140 - 180 mg/dL   Lab Results  Component Value Date   GLUCAP 221 (H) 11/22/2021   HGBA1C 6.8 (H) 11/18/2021    Review of Glycemic Control  Latest Reference Range & Units 11/21/21 16:18 11/21/21 21:09 11/22/21 06:29  Glucose-Capillary 70 - 99 mg/dL 180 (H) 367 (H) 221 (H)  (H): Data is abnormally high Diabetes history: Type 2 DM Outpatient Diabetes medications: none Current orders for Inpatient glycemic control: Novolog 0-9 units TID Decadron 4 mg Q8H  Inpatient Diabetes Program Recommendations:    In the setting of steroids, consider adding Novolog 4 units TID (assuming patient is consuming >50% of meals).   Thanks, Bronson Curb, MSN, RNC-OB Diabetes Coordinator [phone] (8a-5p)

## 2021-11-22 NOTE — Progress Notes (Signed)
Occupational Therapy Session Note  Patient Details  Name: Sheryl Mcmillan MRN: [id number] Date of Birth: 11-08-1975  Today's Date: 11/22/2021 OT Individual Time: 0915-1010 OT Individual Time Calculation (min): 55 min    Short Term Goals: Week 1:  OT Short Term Goal 1 (Week 1): Pt will consistently transfer to toilet wiht MIN A and LRAD OT Short Term Goal 2 (Week 1): Pt will bathe 8/10 body parts with AE PRN OT Short Term Goal 3 (Week 1): Pt will recall hemi dressing techiques with no cuing OT Short Term Goal 4 (Week 1): Pt will complete 1/3 components of toileting  Skilled Therapeutic Interventions/Progress Updates:  Skilled OT intervention completed with focus on ADL retraining and AE education, functional endurance. Pt received upright in bed, no c/o pain, requesting to shower. Completed bed mobility with supervision, with pt utilizing hook method for RLE to EOB. Doffed gown with mod I. Sit > stand in stedy with CGA using BUE to pull up with bar, then dependent transfer to shower with supervision for sitting balance. CGA for descent to bench. Shower cap applied for waterproof cover over cranial staples. All bathing completed seated with supervision using LH sponge including for buttocks. Education provided on tub bench for pt's tub/shower at home with transfer technique, with recommendations for minimizing falls and safety with standing with wet feet etc. Education provided on use of reacher for LB dressing including donning weaker side first, using L foot to hook RLE for threading, with pt utilizing reacher in [place]. Min A needed for threading over feet, would benefit from more practice with this technique. Sit > stand with CGA in stedy then mod A donning over hips with pt requiring safety cues to keep one hand on stedy for balance vs BUE unsupported to pull up pants with min A needed to correct postural control. Dependent transfer to sink in stedy, completing brushing teeth and hair combing with  supervision for sitting balance. Donned shirt with supervision, using hemi technique. Seated in w/c, therapist placed RLE AFO in new shoes for trial with transfers in therapy sessions to provide ankle stability 2/2 buckling with good fit, however max A needed for donning all footwear. Pt was left seated in w/c, with chair pad alarm and all needs in reach at end of session.   Therapy Documentation Precautions:  Precautions Precautions: Fall Restrictions Weight Bearing Restrictions: No    Therapy/Group: Individual Therapy  Christinna Sprung E Holley Kocurek 11/22/2021, 7:39 AM

## 2021-11-22 NOTE — Progress Notes (Signed)
Physical Therapy Session Note  Patient Details  Name: Sheryl Mcmillan MRN: [id number] Date of Birth: 01-02-1976  Today's Date: 11/22/2021 PT Individual Time: 1105-1200 and 1305-1420 PT Individual Time Calculation (min): 55 min and 75 min  Short Term Goals: Week 1:  PT Short Term Goal 1 (Week 1): Pt will transfer bed<>chair with CGA PT Short Term Goal 2 (Week 1): Pt will complete sit to stand from chair height with MinA + RW PT Short Term Goal 3 (Week 1): Pt will complete amb of 10 ft with ModA + RW  Skilled Therapeutic Interventions/Progress Updates: Pt presented in w/c completing toileting task with use of urnial. Once completed agreeable to therapy. Pt denies pain during session and states pleased that she has carryover from yesterday to adduct her leg. Pt wearing GRAFO with shoe successfully this session. Session focused on standing and forced use of RLE. Pt propelled to rehab gym with supervision and hemi technique. Pt performed Sit to stand in parallel bars x 5 with emphasis on weight shifting to R. Pt was able to perform all 5 with CGA overall and PTA blocking R knee. Pt able to progress from weight shifting to R to small range TKE (appeared to come primarily from glute vs quads), to toe taps to target with LLE. Pt with x 2 occurences of R knee buckling however pt was able to shift weight to L and maintain standing. Pt then moved over to high/low mat and performed lateral scoot to L with CGA. Pt then attempted AA knee extension/flexion with pillowcase placed over shoe and mat lifted. Pt was unable to extend or initiate extension however pt was able to perform small range hamstring curl to neutral. Pt then inquiring activities that could be performed to assist with recovery. Discussed visualization as well as performing activity with LLE and visualizing performing with RLE (specifically QS) pt also instructed in quad/hamstring co-contraction (squashing the bug). Pt advised will attempt. Pt then  performed Sit to stand from slightly elevated mat x 5 with minA then progressing to Sit to stand with LLE on 2 in block. Pt initially with LLE under mat and instructed in moving more forward to encourage RLE activation. When performing Sit to stand with 2in block pt encouraged to lean to R for increased forced use. Pt with x 1 occurrence of R knee buckling with this activity however PTA was able to assist with recovery back to mat. Pt then performed lateral scoot to return to w/c to R with pt encouraged to lean into RLE for wt bearing. Pt propelled back to room in same manner as prior and remained in w/c at end of session with call bell within reach and needs met.   Tx2: Pt presented in w/c with urinal completing toileting task. Pt left room briefly and upon PTA return pt noted to have slipped forward in w/c with buttocks off chair and back positioned in seat of chair. With +2 assistance pt repositioned and performed anterior scoot to bed for safety. From bed pt was able to perform lateral scoot to return to w/c. Provided continued education on safety as pt states she forgot to move RLE off leg rest and believes she scooted too far forward to position urinal. Pt then propelled to rehab gym with supervision and hemi technique. Performed lateral scoot to high/low mat. PTA initially setting up pt with [facility] however noted that hub was not in place and battery dead, therefore set up pt in Lite Gait. Pt ambulated  75f in Lite gait initially requiring total A for advancement of RLE then at approx 30 ft pt showing some advancement of RLE then as nearing end of bout pt advancing consistently with PTA correcting foot placement and providing facilitation for TKE in stance phase. After extended seated break pt ambulated an additional 63fwith PTA placing toe cap over shoe. Pt required cues to move with slower controlled movement and demonstrated improved ankle positioning when instructed to "step out" to improve BOS. Pt  was able to advance RLE ~80% of time and demonstrated improved knee stability however when distracted did have x 2 episodes of R knee buckling requiring assistance from PTA for correction. Once completed pt propelled back to room with supervision and performed lateral scoot to R to bed with CGA. PTA assisted with doffing shoes and pt performed sit to supine with supervision and use of bed features. Pt left in bed at end of session with bed alarm on, call bell within reach and needs met.      Therapy Documentation Precautions:  Precautions Precautions: Fall Restrictions Weight Bearing Restrictions: No General:   Vital Signs:   Pain: Pain Assessment Pain Scale: 0-10 Pain Score: 0-No pain Mobility:   Locomotion :    Trunk/Postural Assessment :    Balance:   Exercises:   Other Treatments:      Therapy/Group: Individual Therapy  Dillyn Menna 11/22/2021, 12:53 PM

## 2021-11-22 NOTE — Progress Notes (Signed)
PROGRESS NOTE   Subjective/Complaints: Discussed elevated CBGs- she notes CBG was checked this AM after she drank hot tea with honey and she will make sure not to eat or drink until CBG is checked.   ROS: Patient denies fever, rash, sore throat, blurred vision, dizziness, nausea, vomiting, diarrhea, cough, shortness of breath or chest pain, joint or back/neck pain, headache, or mood change.   Objective:   VAS [place] LOWER EXTREMITY VENOUS (DVT)  Result Date: 11/21/2021  Lower Venous DVT Study Patient Name:  Sheryl Mcmillan  Date of Exam:   11/21/2021 Medical Rec #: [id number]        Accession #:    [id number] Date of Birth: 08/31/1975         Patient Gender: F Patient Age:   46 years Exam Location:  [facility] Procedure:      VAS [place] LOWER EXTREMITY VENOUS (DVT) Referring Phys: Risa Grill --------------------------------------------------------------------------------  Indications: Swelling.  Risk Factors: Surgery S/P LT FRONTAL CRANIOTOMY TURMOR EXCISION ON 11/10/21. Comparison Study: No priors. Performing Technologist: Oda Cogan RDMS, RVT  Examination Guidelines: A complete evaluation includes B-mode imaging, spectral Doppler, color Doppler, and power Doppler as needed of all accessible portions of each vessel. Bilateral testing is considered an integral part of a complete examination. Limited examinations for reoccurring indications may be performed as noted. The reflux portion of the exam is performed with the patient in reverse Trendelenburg.  +---------+---------------+---------+-----------+----------+--------------+ RIGHT    CompressibilityPhasicitySpontaneityPropertiesThrombus Aging +---------+---------------+---------+-----------+----------+--------------+ CFV      Full           Yes      Yes                                 +---------+---------------+---------+-----------+----------+--------------+ SFJ      Full                                                         +---------+---------------+---------+-----------+----------+--------------+ FV Prox  Full                                                        +---------+---------------+---------+-----------+----------+--------------+ FV Mid   Full                                                        +---------+---------------+---------+-----------+----------+--------------+ FV DistalFull                                                        +---------+---------------+---------+-----------+----------+--------------+  PFV      Full                                                        +---------+---------------+---------+-----------+----------+--------------+ POP      Full           Yes      Yes                                 +---------+---------------+---------+-----------+----------+--------------+ PTV      Full                                                        +---------+---------------+---------+-----------+----------+--------------+ PERO     Full                                                        +---------+---------------+---------+-----------+----------+--------------+   +---------+---------------+---------+-----------+----------+--------------+ LEFT     CompressibilityPhasicitySpontaneityPropertiesThrombus Aging +---------+---------------+---------+-----------+----------+--------------+ CFV      Full           Yes      Yes                                 +---------+---------------+---------+-----------+----------+--------------+ SFJ      Full                                                        +---------+---------------+---------+-----------+----------+--------------+ FV Prox  Full                                                        +---------+---------------+---------+-----------+----------+--------------+ FV Mid   Full                                                         +---------+---------------+---------+-----------+----------+--------------+ FV DistalFull                                                        +---------+---------------+---------+-----------+----------+--------------+ PFV      Full                                                        +---------+---------------+---------+-----------+----------+--------------+  POP      Full           Yes      Yes                                 +---------+---------------+---------+-----------+----------+--------------+ PTV      Full                                                        +---------+---------------+---------+-----------+----------+--------------+ PERO     Full                                                        +---------+---------------+---------+-----------+----------+--------------+     Summary: BILATERAL: - No evidence of deep vein thrombosis seen in the lower extremities, bilaterally. -No evidence of popliteal cyst, bilaterally.   *See table(s) above for measurements and observations. Electronically signed by Deitra Mayo MD on 11/21/2021 at 6:23:50 PM.    Final    Recent Labs    11/20/21 0623  WBC 16.5*  HGB 13.9  HCT 41.0  PLT 273   No results for input(s): NA, K, CL, CO2, GLUCOSE, BUN, CREATININE, CALCIUM in the last 72 hours.   Intake/Output Summary (Last 24 hours) at 11/22/2021 1133 Last data filed at 11/22/2021 0736 Gross per 24 hour  Intake 360 ml  Output 200 ml  Net 160 ml        Physical Exam: Vital Signs Blood pressure 122/77, pulse 73, temperature 98.4 F (36.9 C), temperature source Oral, resp. rate 18, height '5\' 11"'$  (1.803 m), weight 131 kg, last menstrual period 09/29/2020, SpO2 100 %. Constitutional: No distress . Vital signs reviewed. BMI 40.28 HEENT: NCAT, EOMI, oral membranes moist Neck: supple Cardiovascular: RRR without murmur. No JVD    Respiratory/Chest: CTA Bilaterally without wheezes or rales. Normal effort     GI/Abdomen: BS +, non-tender, non-distended Ext: no clubbing, cyanosis, or edema Psych: pleasant and cooperative  Skin: incision CDI on scalp Musculoskeletal:        General: No swelling.     Comments: Right heel cord is tight  Can sit to stand in [place] Neurological:     Mental Status: She is alert.     Motor: Weakness present.     Comments: Alert and oriented x 3. Normal insight and awareness. Intact Memory. Normal language and speech. Cranial nerve exam unremarkable for mild right central 7 still present. RUE 0/5 deltoids, 2+ to 3/5 biceps, triceps  and 4-5/5 wrist and fingers. RLE: 1/5 HF/HAB and 0/5 elsewhere. DTR's 1+. Sensory exam normal for light touch and pain in all 4 limbs. No limb ataxia or cerebellar signs. No abnormal tone appreciated.       Assessment/Plan: 1. Functional deficits which require 3+ hours per day of interdisciplinary therapy in a comprehensive inpatient rehab setting. Physiatrist is providing close team supervision and 24 hour management of active medical problems listed below. Physiatrist and rehab team continue to assess barriers to discharge/monitor patient progress toward functional and medical goals  Care Tool:  Bathing    Body parts bathed by patient: Right arm, Chest,  Left arm, Abdomen, Front perineal area, Right upper leg, Left upper leg, Right lower leg, Left lower leg, Face, Buttocks   Body parts bathed by helper: Buttocks     Bathing assist Assist Level: Supervision/Verbal cueing     Upper Body Dressing/Undressing Upper body dressing   What is the patient wearing?: Dress    Upper body assist Assist Level: Supervision/Verbal cueing (in stedy)    Lower Body Dressing/Undressing Lower body dressing      What is the patient wearing?: Pants     Lower body assist Assist for lower body dressing: Maximal Assistance - Patient 25 - 49%     Toileting Toileting    Toileting assist Assist for toileting: Dependent - Patient 0% (stedy)      Transfers Chair/bed transfer  Transfers assist     Chair/bed transfer assist level: Minimal Assistance - Patient > 75% (to R)     Locomotion Ambulation   Ambulation assist   Ambulation activity did not occur: Safety/medical concerns (0/5 R LE MMT)          Walk 10 feet activity   Assist  Walk 10 feet activity did not occur: Safety/medical concerns (0/5 R LE MMT)        Walk 50 feet activity   Assist Walk 50 feet with 2 turns activity did not occur: Safety/medical concerns (0/5 R LE MMT)         Walk 150 feet activity   Assist Walk 150 feet activity did not occur: Safety/medical concerns (0/5 R LE MMT)         Walk 10 feet on uneven surface  activity   Assist Walk 10 feet on uneven surfaces activity did not occur: Safety/medical concerns (0/5 R LE MMT)         Wheelchair     Assist Is the patient using a wheelchair?: Yes Type of Wheelchair: Manual Wheelchair activity did not occur: Safety/medical concerns (Per PT)  Wheelchair assist level: Supervision/Verbal cueing Max wheelchair distance: 132f    Wheelchair 50 feet with 2 turns activity    Assist        Assist Level: Supervision/Verbal cueing   Wheelchair 150 feet activity     Assist      Assist Level: Supervision/Verbal cueing   Blood pressure 122/77, pulse 73, temperature 98.4 F (36.9 C), temperature source Oral, resp. rate 18, height '5\' 11"'$  (1.803 m), weight 131 kg, last menstrual period 09/29/2020, SpO2 100 %.    Medical Problem List and Plan: 1. Functional deficits secondary to left posterior frontal lobe metastatic breast cancer, s/p resection with right-sided weakness             -patient may shower             -ELOS/Goals: 14-20 days, min assist goals with PT/OT and mod I with SLP             -keep RUE supported given proximal muscle weakness.              -PRAFO to help support/stretch right heel cord. Consider night splint  Continue CIR therapies  including PT, OT, SLP  -Interdisciplinary Team Conference today   2.  Antithrombotics: -DVT/anticoagulation:  Mechanical:  Antiembolism stockings, knee (TED hose) Bilateral lower extremities Sequential compression devices, below knee Bilateral lower extremities             -check dopplers 5/29- Dopplers still pending- on Lovenox 30 mg q12 hours             -  antiplatelet therapy: none 3. Pain Management: Tylenol, Norco as needed  5/29 - pain controlled- con't regimen 4. Mood: LCSW to evaluate and provide emotional support             -antipsychotic agents: n/a 5. Neuropsych: This patient is capable of making decisions on her own behalf. 6. Craniotomy incision site: placed nursing order to remove staples on Thursday.  7. 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 Will see if can use BSC since will have been seen by therapy at that point.   5/29: 2 large BM's 5/27- con't regimen- use sorbitol prn 11: Sensitivity to [employer] laundry products; is not allergic to baking powder 12: Intermittent elevated BP: continue to monitor --continue clonidine 0.1 mg BID. Consider TID dosing more consistent control 13: Morbid obesity: BMI 43.97; dietary counseling  14. Insomnia: continue melatonin  15. Leukocytosis  5/27- WBC 15.9- but since last labs, have new Decadron 4 mg q6 hours- afebrile- no signs/Sx's of illness- will recheck Monday and monitor  5/29 WBC is 16.5- discussed with patient that this has been stable.   5/31:  discuss with NSGY reducing steroids.  16. Hyperglycemia  5/28- started sensitive SSI and CBGs since had BG of 207 on BMP yesterday.  5/29- discussed how decadron will affects cbg's. Hopefully with taper we'll see improvement. Sugars still high this morning  5/30: start metformin '500mg'$  daily.   5/31: increase metformin to '500mg'$  BID.           LOS: 5 days A FACE TO FACE EVALUATION WAS PERFORMED  Clide Deutscher Evelio Rueda 11/22/2021, 11:33 AM

## 2021-11-22 NOTE — Progress Notes (Signed)
Occupational Therapy Session Note  Patient Details  Name: Sheryl Mcmillan MRN: [id number] Date of Birth: 12/11/1975  Today's Date: 11/22/2021 OT Individual Time: 3202-3343 OT Individual Time Calculation (min): 30 min    Short Term Goals: Week 1:  OT Short Term Goal 1 (Week 1): Pt will consistently transfer to toilet wiht MIN A and LRAD OT Short Term Goal 2 (Week 1): Pt will bathe 8/10 body parts with AE PRN OT Short Term Goal 3 (Week 1): Pt will recall hemi dressing techiques with no cuing OT Short Term Goal 4 (Week 1): Pt will complete 1/3 components of toileting  Skilled Therapeutic Interventions/Progress Updates:    Pt received seated in w/c with LPN present, dressed and ready for the day, agreeable to therapy. Session focus on  activity tolerance, transfer retraining, RUE/RLE NMR in prep for improved ADL/IADL/func mobility performance + decreased caregiver burden. No c/o pain throughout.  Self-propelled w/c via hemi technique with distant S.   Attempted sit to stand in // bars with LLE supported on 4 inch step to promote improved activation of RLE. Pt unable to come into standing from low w/c height.   Squat-pivot to and from mat table with CGA/assist to stabilize w/c. Pt able to independently manage parts and only needs assist to place RLE on/off foot rest.   Pt able to complete 10 sit to stands with LLE supported on 2 inch step and from elevated height of mat. Required overall heavy min A to CGA to block RLE. Additionally completed 10 mini squats/push ups with RW to support RUE/RLE WB. Cues for split hand technique and to control descent.  Pt left seated in w/c awaiting following PT session with call bell in reach, and all immediate needs met.    Therapy Documentation Precautions:  Precautions Precautions: Fall Restrictions Weight Bearing Restrictions: No  Pain: no c/o throughout   ADL: See Care Tool for more details.  Therapy/Group: Individual Therapy  Volanda Napoleon MS, OTR/L  11/22/2021, 6:56 AM

## 2021-11-22 NOTE — Patient Care Conference (Signed)
Inpatient RehabilitationTeam Conference and Plan of Care Update Date: 11/22/2021   Time: 11:30 AM    Patient Name: Sheryl Mcmillan      Medical Record Number: [id number]  Date of Birth: [birth year] Sex: Female         Room/Bed: 4W13C/4W13C-01 Payor Info: Payor: [place] / Plan: [place] MEDICAID [place] / Product Type: *No Product type* /    Admit Date/Time:  11/17/2021  4:04 PM  Primary Diagnosis:  Brain tumor [facility])  Hospital Problems: Principal Problem:   Brain tumor [facility])    Expected Discharge Date: Expected Discharge Date: 12/08/21  Team Members Present: Physician leading conference: Dr. Leeroy Cha Social Worker Present: Erlene Quan, BSW Nurse Present: Dorthula Nettles, RN PT Present: Ailene Rud, PT OT Present: Jennefer Bravo, OT PPS Coordinator present : Gunnar Fusi, SLP     Current Status/Progress Goal Weekly Team Focus  Bowel/Bladder   cont x 2 lbm 5/28  remain cont  assess q shift and prn   Swallow/Nutrition/ Hydration             ADL's   Min A bathing at shower level, max A LB however pt prefers gowns and stays nude at home per baseline.., Max A toileting, dependent toilet transfers in stedy 2/2 knee buckling/RLE weakness  supervision, slightly impulsive but more just ready to get back to independence but I expect will need cues to slow down at d/c  safety awareness, standing tolerance, ADL retraining with AE, RUE NMR   Mobility   supine<>sit supervision, lateral scoots CGA/min A, sit<>stands in // bars mod A with R knee block, no gait  supervision, CGA steps  functional mobility/transfers, generalized strengthening and endurance, dynamic standing balance/coordination, pre-gait training   Communication             Safety/Cognition/ Behavioral Observations            Pain   no current complains of pain  pain <3  assess q shift and prn   Skin   skin intact  no new breakdown  assess q shift and prn      Discharge Planning:  discharging home with assistance from daughter and friends who are nurses   Team Discussion: Blood sugars elevated, increased Metformin to BID. Consulting Neuro to wean off steroids. Staples to be discontinued Thursday. Continent B/B, with urgency. No pain reported, head incision CDI. Discharging home with daughter, also has friends that can assist.  Patient on target to meet rehab goals: yes, supervision goals, CGA with stairs. Currently max assist toileting with the steady. Min assist to shower. CGA STS. Will trial AFO.  *See Care Plan and progress notes for long and short-term goals.   Revisions to Treatment Plan:  Adjusting medications   Teaching Needs: Family education, medication/pain management, skin/wound care, safety awareness, transfer/gait training, etc.   Current Barriers to Discharge: Decreased caregiver support, Home enviroment access/layout, Wound care, Lack of/limited family support, Insurance for SNF coverage, and Weight  Possible Resolutions to Barriers: Family education Follow-up therapy Order recommended DME     Medical Summary Current Status: denies pain, obesity, brain tumor, hyperglycemia and leukocytosis secondary to steroids, craniotomy incisions, urinary urgencey and incontinence  Barriers to Discharge: Neurogenic Bowel & Bladder;Medical stability;Weight  Barriers to Discharge Comments: denies pain, obesity, brain tumor, hyperglycemia and leukocytosis secondary to steroids, craniotomy incisions, urinary urgencey and incontinence Possible Resolutions to [employer] Focus: provided dietary education, will discuss with surgery weaning steroids, continue to monitor WBC,  discussed with patient and nursing staple removal tomorrow, bowel and bladder program   Continued Need for Acute Rehabilitation Level of Care: The patient requires daily medical management by a physician with specialized training in physical medicine and  rehabilitation for the following reasons: Direction of a multidisciplinary physical rehabilitation program to maximize functional independence : Yes Medical management of patient stability for increased activity during participation in an intensive rehabilitation regime.: Yes Analysis of laboratory values and/or radiology reports with any subsequent need for medication adjustment and/or medical intervention. : Yes   I attest that I was present, lead the team conference, and concur with the assessment and plan of the team.   Cristi Loron 11/22/2021, 12:20 PM

## 2021-11-23 LAB — GLUCOSE, CAPILLARY
Glucose-Capillary: 193 mg/dL — ABNORMAL HIGH (ref 70–99)
Glucose-Capillary: 156 mg/dL — ABNORMAL HIGH (ref 70–99)
Glucose-Capillary: 176 mg/dL — ABNORMAL HIGH (ref 70–99)
Glucose-Capillary: 225 mg/dL — ABNORMAL HIGH (ref 70–99)

## 2021-11-23 MED ORDER — LEVETIRACETAM 750 MG PO TABS
750.0000 mg | ORAL_TABLET | Freq: Two times a day (BID) | ORAL | Status: DC
  Administered 2021-11-23 (×2): 750 mg via ORAL
  Filled 2021-11-23 (×3): qty 1

## 2021-11-23 NOTE — Progress Notes (Signed)
Staples removed on head incision per order. No complications noted. Site is clean, dry, pink and approximated. Pt tolerated well. Sheela Stack, LPN

## 2021-11-23 NOTE — Progress Notes (Signed)
Occupational Therapy Session Note  Patient Details  Name: Sheryl Mcmillan MRN: [id number] Date of Birth: [birth year]  Today's Date: 11/23/2021 OT Individual Time: 0900-1000 OT Individual Time Calculation (min): 60 min    Short Term Goals: Week 1:  OT Short Term Goal 1 (Week 1): Pt will consistently transfer to toilet wiht MIN A and LRAD OT Short Term Goal 2 (Week 1): Pt will bathe 8/10 body parts with AE PRN OT Short Term Goal 3 (Week 1): Pt will recall hemi dressing techiques with no cuing OT Short Term Goal 4 (Week 1): Pt will complete 1/3 components of toileting Week 2:     Skilled Therapeutic Interventions/Progress Updates:    Patient in bed upon arrival, very pleasant. Patient indicated that she desired to work on BADL related task in showering.  The pt initiated a functional transfer from bed LOF to EOB, requiring vc's secondary to impulsivity, she was SBA for transferring to the EOB incorporating BLE.  The pt was able to come from sit to stand using the [facility] for safe task performance and increase independence, right knee position malaligned secondary to buckling externally,pt repositioned her knee for proper placement.  The pt was able to transfer using the stedy to transfer onto the shower bench with [place].  The pt was able to bathe her UB/LB with ModI incorporating the long hand sponge and su assist. The pt exited the restroom using the [facility] with MinA and was positioned at the sink to complete grooming task at s/u for brushing her teeth, combing her hair and putting on lotion, and deodorant.  The pt transfer to EOB using the [facility] with MinA, she was able to dress her UB/LB with s/u for UB and MinA and modified position for LB dressing.  The pt completed the session returning to bed level with her call light and bedside table in place with no report of pain this treatment session.   Therapy Documentation Precautions:  Precautions Precautions: Fall Restrictions Weight Bearing  Restrictions: No   Therapy/Group: Individual Therapy  Yvonne Kendall 11/23/2021, 11:32 AM

## 2021-11-23 NOTE — Progress Notes (Signed)
Physical Therapy Session Note  Patient Details  Name: Sheryl Mcmillan MRN: [id number] Date of Birth: June 03, 1976  Today's Date: 11/23/2021 PT Individual Time: 1100-1200 PT Individual Time Calculation (min): 60 min   Short Term Goals: Week 1:  PT Short Term Goal 1 (Week 1): Pt will transfer bed<>chair with CGA PT Short Term Goal 2 (Week 1): Pt will complete sit to stand from chair height with MinA + RW PT Short Term Goal 3 (Week 1): Pt will complete amb of 10 ft with ModA + RW  Skilled Therapeutic Interventions/Progress Updates:   Pt seen for co-treatment with PT, Carly. Received pt sitting in WC with R GRAFO donned. Pt agreeable to PT treatment and denied any pain during session but reporting urge to void. Session with emphasis on functional mobility/transfers, toileting, generalized strengthening and endurance, and dynamic standing balance/coordination, and gait training. Pt transferred sit<>stand in [place] with min A and transferred to/from regular height toilet dependently. Pt able to void and performed hygiene management with supervision. Sit<>stand from regular toilet with mod A+2. Donned LiteGait harness standing in [employer] with max A +2 due to availability. Pt transported to/from room in [facility] dependently for time management purposes. Pt ambulated 139f x 3 trials in LiteGait with +2 assist. Donned shoe cover to assist with toe clearance and leg looper (although did not need to use it). Pt able to advance RLE but required manual facilitation for knee IR. Pt required cues to "kick" RLE, take "big step" on LLE, and for appropriate step length as pt alternating between taking too large and too small of steps. Noted both hyperextension/buckling with variable ability to control each - therapist providing manual stabilization at tibia and around posterior knee joint. Pt fatigued after ambulation, as evidenced by diaphoresis. Returned to room and [facility] needs within reach and daughter  present at bedside.   Therapy Documentation Precautions:  Precautions Precautions: Fall Restrictions Weight Bearing Restrictions: No  Therapy/Group: Co-Treatment AAlfonse AlpersPT, DPT  11/23/2021, 7:07 AM

## 2021-11-23 NOTE — Progress Notes (Signed)
PROGRESS NOTE   Subjective/Complaints: Minimal pain at crani site-had some pain in right leg for which she took a Norco today. Provided with pain relief journal and she is appreciative  ROS: Patient denies fever, rash, sore throat, blurred vision, dizziness, nausea, vomiting, diarrhea, cough, shortness of breath or chest pain, joint or back/neck pain, headache, or mood change.   Objective:   VAS [place] LOWER EXTREMITY VENOUS (DVT)  Result Date: 11/21/2021  Lower Venous DVT Study Patient Name:  Sheryl Mcmillan  Date of Exam:   11/21/2021 Medical Rec #: [id number]        Accession #:    [id number] Date of Birth: 1976/05/30         Patient Gender: F Patient Age:   46 years Exam Location:  [facility] Procedure:      VAS [place] LOWER EXTREMITY VENOUS (DVT) Referring Phys: Risa Grill --------------------------------------------------------------------------------  Indications: Swelling.  Risk Factors: Surgery S/P LT FRONTAL CRANIOTOMY TURMOR EXCISION ON 11/10/21. Comparison Study: No priors. Performing Technologist: Oda Cogan RDMS, RVT  Examination Guidelines: A complete evaluation includes B-mode imaging, spectral Doppler, color Doppler, and power Doppler as needed of all accessible portions of each vessel. Bilateral testing is considered an integral part of a complete examination. Limited examinations for reoccurring indications may be performed as noted. The reflux portion of the exam is performed with the patient in reverse Trendelenburg.  +---------+---------------+---------+-----------+----------+--------------+ RIGHT    CompressibilityPhasicitySpontaneityPropertiesThrombus Aging +---------+---------------+---------+-----------+----------+--------------+ CFV      Full           Yes      Yes                                 +---------+---------------+---------+-----------+----------+--------------+ SFJ      Full                                                         +---------+---------------+---------+-----------+----------+--------------+ FV Prox  Full                                                        +---------+---------------+---------+-----------+----------+--------------+ FV Mid   Full                                                        +---------+---------------+---------+-----------+----------+--------------+ FV DistalFull                                                        +---------+---------------+---------+-----------+----------+--------------+  PFV      Full                                                        +---------+---------------+---------+-----------+----------+--------------+ POP      Full           Yes      Yes                                 +---------+---------------+---------+-----------+----------+--------------+ PTV      Full                                                        +---------+---------------+---------+-----------+----------+--------------+ PERO     Full                                                        +---------+---------------+---------+-----------+----------+--------------+   +---------+---------------+---------+-----------+----------+--------------+ LEFT     CompressibilityPhasicitySpontaneityPropertiesThrombus Aging +---------+---------------+---------+-----------+----------+--------------+ CFV      Full           Yes      Yes                                 +---------+---------------+---------+-----------+----------+--------------+ SFJ      Full                                                        +---------+---------------+---------+-----------+----------+--------------+ FV Prox  Full                                                        +---------+---------------+---------+-----------+----------+--------------+ FV Mid   Full                                                         +---------+---------------+---------+-----------+----------+--------------+ FV DistalFull                                                        +---------+---------------+---------+-----------+----------+--------------+ PFV      Full                                                        +---------+---------------+---------+-----------+----------+--------------+  POP      Full           Yes      Yes                                 +---------+---------------+---------+-----------+----------+--------------+ PTV      Full                                                        +---------+---------------+---------+-----------+----------+--------------+ PERO     Full                                                        +---------+---------------+---------+-----------+----------+--------------+     Summary: BILATERAL: - No evidence of deep vein thrombosis seen in the lower extremities, bilaterally. -No evidence of popliteal cyst, bilaterally.   *See table(s) above for measurements and observations. Electronically signed by Deitra Mayo MD on 11/21/2021 at 6:23:50 PM.    Final    No results for input(s): WBC, HGB, HCT, PLT in the last 72 hours.  No results for input(s): NA, K, CL, CO2, GLUCOSE, BUN, CREATININE, CALCIUM in the last 72 hours.   Intake/Output Summary (Last 24 hours) at 11/23/2021 0938 Last data filed at 11/23/2021 0700 Gross per 24 hour  Intake 896 ml  Output --  Net 896 ml        Physical Exam: Vital Signs Blood pressure 121/81, pulse (!) 53, temperature 97.7 F (36.5 C), resp. rate 18, height '5\' 11"'$  (1.803 m), weight 131 kg, last menstrual period 09/29/2020, SpO2 100 %. Constitutional: No distress . Vital signs reviewed. BMI 40.28 HEENT: NCAT, EOMI, oral membranes moist Neck: supple Cardiovascular: Bradycardia Respiratory/Chest: CTA Bilaterally without wheezes or rales. Normal effort    GI/Abdomen: BS +, non-tender, non-distended Ext: no  clubbing, cyanosis, or edema Psych: pleasant and cooperative  Skin: incision CDI on scalp Musculoskeletal:        General: No swelling.     Comments: Right heel cord is tight  Can sit to stand in [place] Neurological:     Mental Status: She is alert.     Motor: Weakness present.     Comments: Alert and oriented x 3. Normal insight and awareness. Intact Memory. Normal language and speech. Cranial nerve exam unremarkable for mild right central 7 still present. RUE 0/5 deltoids, 2+ to 3/5 biceps, triceps  and 4-5/5 wrist and fingers. RLE: 1/5 HF/HAB and 0/5 elsewhere. DTR's 1+. Sensory exam normal for light touch and pain in all 4 limbs. No limb ataxia or cerebellar signs. No abnormal tone appreciated.       Assessment/Plan: 1. Functional deficits which require 3+ hours per day of interdisciplinary therapy in a comprehensive inpatient rehab setting. Physiatrist is providing close team supervision and 24 hour management of active medical problems listed below. Physiatrist and rehab team continue to assess barriers to discharge/monitor patient progress toward functional and medical goals  Care Tool:  Bathing    Body parts bathed by patient: Right arm, Chest, Left arm, Abdomen, Front perineal area, Right upper leg, Left upper leg, Right lower leg, Left lower  leg, Face, Buttocks   Body parts bathed by helper: Buttocks     Bathing assist Assist Level: Supervision/Verbal cueing     Upper Body Dressing/Undressing Upper body dressing   What is the patient wearing?: Dress    Upper body assist Assist Level: Supervision/Verbal cueing (in stedy)    Lower Body Dressing/Undressing Lower body dressing      What is the patient wearing?: Pants     Lower body assist Assist for lower body dressing: Maximal Assistance - Patient 25 - 49%     Toileting Toileting    Toileting assist Assist for toileting: Dependent - Patient 0% (stedy)     Transfers Chair/bed transfer  Transfers assist      Chair/bed transfer assist level: Minimal Assistance - Patient > 75% (to R)     Locomotion Ambulation   Ambulation assist   Ambulation activity did not occur: Safety/medical concerns (0/5 R LE MMT)          Walk 10 feet activity   Assist  Walk 10 feet activity did not occur: Safety/medical concerns (0/5 R LE MMT)        Walk 50 feet activity   Assist Walk 50 feet with 2 turns activity did not occur: Safety/medical concerns (0/5 R LE MMT)         Walk 150 feet activity   Assist Walk 150 feet activity did not occur: Safety/medical concerns (0/5 R LE MMT)         Walk 10 feet on uneven surface  activity   Assist Walk 10 feet on uneven surfaces activity did not occur: Safety/medical concerns (0/5 R LE MMT)         Wheelchair     Assist Is the patient using a wheelchair?: Yes Type of Wheelchair: Manual Wheelchair activity did not occur: Safety/medical concerns (Per PT)  Wheelchair assist level: Supervision/Verbal cueing Max wheelchair distance: 145f    Wheelchair 50 feet with 2 turns activity    Assist        Assist Level: Supervision/Verbal cueing   Wheelchair 150 feet activity     Assist      Assist Level: Supervision/Verbal cueing   Blood pressure 121/81, pulse (!) 53, temperature 97.7 F (36.5 C), resp. rate 18, height '5\' 11"'$  (1.803 m), weight 131 kg, last menstrual period 09/29/2020, SpO2 100 %.    Medical Problem List and Plan: 1. Functional deficits secondary to left posterior frontal lobe metastatic breast cancer, s/p resection with right-sided weakness             -patient may shower             -ELOS/Goals: 14-20 days, min assist goals with PT/OT and mod I with SLP             -keep RUE supported given proximal muscle weakness.              -PRAFO to help support/stretch right heel cord. Consider night splint  Continue CIR therapies including PT, OT, SLP 2.  Antithrombotics: -DVT/anticoagulation:  Mechanical:   Antiembolism stockings, knee (TED hose) Bilateral lower extremities Sequential compression devices, below knee Bilateral lower extremities             -check dopplers 5/29- Dopplers still pending- on Lovenox 30 mg q12 hours             -antiplatelet therapy: none 3. Pain Management: Tylenol, Norco as needed  5/29 - pain controlled- con't regimen 4. Mood: LCSW to evaluate and  provide emotional support             -antipsychotic agents: n/a 5. Neuropsych: This patient is capable of making decisions on her own behalf. 6. Craniotomy incision site: staples removed today without complication.  7. 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 Will see if can use BSC since will have been seen by therapy at that point.   5/29: 2 large BM's 5/27- con't regimen- use sorbitol prn 11: Sensitivity to [employer] laundry products; is not allergic to baking powder 12: Intermittent elevated BP: continue to monitor --continue clonidine 0.1 mg BID. Consider TID dosing more consistent control 13: Morbid obesity: BMI 43.97; dietary counseling  14. Insomnia: continue melatonin  15. Leukocytosis  5/27- WBC 15.9- but since last labs, have new Decadron 4 mg q6 hours- afebrile- no signs/Sx's of illness- will recheck Monday and monitor  5/29 WBC is 16.5- discussed with patient that this has been stable.   5/31: discuss with NSGY reducing steroids.  16. Hyperglycemia  5/28- started sensitive SSI and  CBGs since had BG of 207 on BMP yesterday.  5/29- discussed how decadron will affects cbg's. Hopefully with taper we'll see improvement. Sugars still high this morning  5/30: start metformin '500mg'$  daily.   5/31: increase metformin to '500mg'$  BID.   6/1: increase metformin to '850mg'$           LOS: 6 days A FACE TO FACE EVALUATION WAS PERFORMED  Clide Deutscher Elga Santy 11/23/2021, 9:38 AM

## 2021-11-23 NOTE — Progress Notes (Signed)
Physical Therapy Session Note  Patient Details  Name: Sheryl Mcmillan MRN: [id number] Date of Birth: [birth year]  Today's Date: 11/23/2021 PT Individual Time: 1015-1045 PT Individual Time Calculation (min): 30 min   Short Term Goals: Week 1:  PT Short Term Goal 1 (Week 1): Pt will transfer bed<>chair with CGA PT Short Term Goal 2 (Week 1): Pt will complete sit to stand from chair height with MinA + RW PT Short Term Goal 3 (Week 1): Pt will complete amb of 10 ft with ModA + RW  Skilled Therapeutic Interventions/Progress Updates:    Pt received seated in bed, agreeable to PT session. No complaints of pain. Bed mobility Supervision. Pt is setup A to don L sock and shoe, assist needed for RLE with GRAFO. Lateral scoot transfer bed to w/c with CGA. Manual w/c propulsion to/from therapy gym with use of BUE and LLE at Supervision level. Sit to stand x 5 reps in // bars with min A, good adherence to anterior weight shift and "nose over toes" during sit to stand. Standing forward/backward stepping with LLE with R knee blocked for stability, knee only buckles 2 times during 20 rep set. Standing R knee TKE 2 x 10 reps, pt exhibits compensations using her glutes rather than quads with onset of fatigue. Standing mini squats 2 x 10 reps in // bars with R knee blocked, some difficulty achieving full knee extension especially with onset of fatigue. Pt left seated in w/c in room with needs in reach at end of session.  Therapy Documentation Precautions:  Precautions Precautions: Fall Restrictions Weight Bearing Restrictions: No      Therapy/Group: Individual Therapy   Excell Seltzer, PT, DPT, CSRS 11/23/2021, 11:58 AM

## 2021-11-23 NOTE — Progress Notes (Signed)
Pt c/o small red bumps on chest that is not per usual. Area does not itch. Benadryl given.  Sheela Stack, LPN

## 2021-11-23 NOTE — Progress Notes (Signed)
Physical Therapy Session Note  Patient Details  Name: Sheryl Mcmillan MRN: [id number] Date of Birth: [birth year]  Today's Date: 11/23/2021 PT Individual Time: 3154-0086 PT Individual Time Calculation (min): 55 min   Short Term Goals: Week 1:  PT Short Term Goal 1 (Week 1): Pt will transfer bed<>chair with CGA PT Short Term Goal 2 (Week 1): Pt will complete sit to stand from chair height with MinA + RW PT Short Term Goal 3 (Week 1): Pt will complete amb of 10 ft with ModA + RW  Skilled Therapeutic Interventions/Progress Updates Received pt sitting in [facility] with daughter present at bedside and R GRAFO donned. Pt agreeable to PT treatment and denied any pain during session. Session with emphasis on functional mobility/transfers, generalized strengthening and endurance, and gait training. Pt performed WC mobility 124f x 2 trials using R hemi technique and supervision/mod I to/from room. [facility] and donned LiteGait harness in standing with mod A+2. Pt ambulated 1879fx 3 and 24432f 2 in LiteGait with +2 assist with shoe cover over R foot. Pt with improvements in ability to control R knee this afternoon, with noted less buckling and hyperextension overall but continues to require assist for appropriate step length as well as R knee/ankle IR for improved step through. Pt also able to increase cadence and maintain form this afternoon and incorporate dual tasking carrying on conversations while walking (but needs continued practice with this due to 2-3 instances of R knee buckling and R foot drag). Pt required cues to "kick" with RLE and step through with LLE, but improvements in upright gaze/posture. Of note, pt stood for ~40 minutes straight during session and did not sit for rest break until completing all gait training at end of session. Concluded session with [facility] all needs within reach and daughter present at bedside.   Therapy  Documentation Precautions:  Precautions Precautions: Fall Restrictions Weight Bearing Restrictions: No  Therapy/Group: Individual Therapy AnnAlfonse Alpers, DPT  11/23/2021, 7:06 AM

## 2021-11-24 LAB — CBC
HCT: 39.9 % (ref 36.0–46.0)
Hemoglobin: 13.4 g/dL (ref 12.0–15.0)
MCH: 27.9 pg (ref 26.0–34.0)
MCHC: 33.6 g/dL (ref 30.0–36.0)
MCV: 83.1 fL (ref 80.0–100.0)
Platelets: 234 10*3/uL (ref 150–400)
RBC: 4.8 MIL/uL (ref 3.87–5.11)
RDW: 14.3 % (ref 11.5–15.5)
WBC: 12 10*3/uL — ABNORMAL HIGH (ref 4.0–10.5)
nRBC: 0 % (ref 0.0–0.2)

## 2021-11-24 LAB — GLUCOSE, CAPILLARY
Glucose-Capillary: 238 mg/dL — ABNORMAL HIGH (ref 70–99)
Glucose-Capillary: 139 mg/dL — ABNORMAL HIGH (ref 70–99)
Glucose-Capillary: 224 mg/dL — ABNORMAL HIGH (ref 70–99)
Glucose-Capillary: 169 mg/dL — ABNORMAL HIGH (ref 70–99)

## 2021-11-24 LAB — BASIC METABOLIC PANEL
Anion gap: 9 (ref 5–15)
BUN: 20 mg/dL (ref 6–20)
CO2: 21 mmol/L — ABNORMAL LOW (ref 22–32)
Calcium: 8.8 mg/dL — ABNORMAL LOW (ref 8.9–10.3)
Chloride: 104 mmol/L (ref 98–111)
Creatinine, Ser: 0.72 mg/dL (ref 0.44–1.00)
GFR, Estimated: >60 mL/min (ref >60)
Glucose, Bld: 217 mg/dL — ABNORMAL HIGH (ref 70–99)
Potassium: 4 mmol/L (ref 3.5–5.1)
Sodium: 134 mmol/L — ABNORMAL LOW (ref 135–145)

## 2021-11-24 MED ORDER — HYDROCORTISONE 1 % EX CREA
TOPICAL_CREAM | Freq: Three times a day (TID) | CUTANEOUS | Status: DC
  Filled 2021-11-24: qty 28

## 2021-11-24 MED ORDER — MAGNESIUM HYDROXIDE 400 MG/5ML PO SUSP: 30.0000 mL | Freq: Every day | ORAL | Status: DC | PRN

## 2021-11-24 NOTE — Progress Notes (Signed)
Occupational Therapy Session Note  Patient Details  Name: Sheryl Mcmillan MRN: [id number] Date of Birth: 04-23-1976  Today's Date: 11/24/2021 OT Individual Time: 1030-1200 OT Individual Time Calculation (min): 90 min    Short Term Goals: Week 1:  OT Short Term Goal 1 (Week 1): Pt will consistently transfer to toilet wiht MIN A and LRAD OT Short Term Goal 2 (Week 1): Pt will bathe 8/10 body parts with AE PRN OT Short Term Goal 3 (Week 1): Pt will recall hemi dressing techiques with no cuing OT Short Term Goal 4 (Week 1): Pt will complete 1/3 components of toileting  Skilled Therapeutic Interventions/Progress Updates:    1:1. Pt excited for OT this session because we are cooking! No pain reported. Pt with all ingredients in kitchen already. Pt completes w/c mobility with supervision throughout session with cuing for w/c placement when getting in cabinets and appliances. Pt completes all transportation of items with supervision with cuing for planning ahead to improve efficiency. Pt stands at sink for washing hands and produce with CGA/guarding RLE. Pt is able to cut up all vegetables with VC for not talking during cutting, use of cut resistant glove at home, and putting down knife when not cutting as pt talks with hands andwill forget where pt has R hand. Pt and OT discuss about carefully reaching over stove as pt stove controls are in the back of the stove putting pt at risk of burning arm reaching over pots. Pt so appreciative of activity and serves plates to staff.   Therapy Documentation Precautions:  Precautions Precautions: Fall Restrictions Weight Bearing Restrictions: No General:     Therapy/Group: Individual Therapy  Tonny Branch 11/24/2021, 6:56 AM

## 2021-11-24 NOTE — Progress Notes (Signed)
PROGRESS NOTE   Subjective/Complaints: Complains of small red bumps over chest that are new for her-messaged nursing to try hypoallergenic sheets and hydrocortisone cream today.  ROS: Patient denies fever, rash, sore throat, blurred vision, dizziness, nausea, vomiting, diarrhea, cough, shortness of breath or chest pain, joint or back/neck pain, headache, or mood change.   Objective:   No results found. Recent Labs    11/24/21 0559  WBC 12.0*  HGB 13.4  HCT 39.9  PLT 234    Recent Labs    11/24/21 0559  NA 134*  K 4.0  CL 104  CO2 21*  GLUCOSE 217*  BUN 20  CREATININE 0.72  CALCIUM 8.8*     Intake/Output Summary (Last 24 hours) at 11/24/2021 1113 Last data filed at 11/24/2021 1019 Gross per 24 hour  Intake 360 ml  Output 200 ml  Net 160 ml        Physical Exam: Vital Signs Blood pressure 118/83, pulse 70, temperature 98.1 F (36.7 C), temperature source Oral, resp. rate 18, height '5\' 11"'$  (1.803 m), weight 131 kg, last menstrual period 09/29/2020, SpO2 99 %. Constitutional: No distress . Vital signs reviewed. BMI 40.28 HEENT: NCAT, EOMI, oral membranes moist Neck: supple Cardiovascular: Bradycardia Respiratory/Chest: CTA Bilaterally without wheezes or rales. Normal effort    GI/Abdomen: BS +, non-tender, non-distended Ext: no clubbing, cyanosis, or edema Psych: pleasant and cooperative  Skin: incision CDI on scalp, rash on chest Musculoskeletal:        General: No swelling.     Comments: Right heel cord is tight  Can sit to stand in [place] Neurological:     Mental Status: She is alert.     Motor: Weakness present.     Comments: Alert and oriented x 3. Normal insight and awareness. Intact Memory. Normal language and speech. Cranial nerve exam unremarkable for mild right central 7 still present. RUE 0/5 deltoids, 2+ to 3/5 biceps, triceps  and 4-5/5 wrist and fingers. RLE: 1/5 HF/HAB and 0/5 elsewhere.  DTR's 1+. Sensory exam normal for light touch and pain in all 4 limbs. No limb ataxia or cerebellar signs. No abnormal tone appreciated.       Assessment/Plan: 1. Functional deficits which require 3+ hours per day of interdisciplinary therapy in a comprehensive inpatient rehab setting. Physiatrist is providing close team supervision and 24 hour management of active medical problems listed below. Physiatrist and rehab team continue to assess barriers to discharge/monitor patient progress toward functional and medical goals  Care Tool:  Bathing    Body parts bathed by patient: Right arm, Chest, Left arm, Abdomen, Front perineal area, Right upper leg, Left upper leg, Right lower leg, Left lower leg, Face, Buttocks   Body parts bathed by helper: Buttocks     Bathing assist Assist Level: Set up assist     Upper Body Dressing/Undressing Upper body dressing   What is the patient wearing?: Dress    Upper body assist Assist Level: Supervision/Verbal cueing (in stedy)    Lower Body Dressing/Undressing Lower body dressing      What is the patient wearing?: Pants     Lower body assist Assist for lower body dressing: Minimal Assistance -  Patient > 75% (bed level)     Toileting Toileting    Toileting assist Assist for toileting: Dependent - Patient 0% (stedy)     Transfers Chair/bed transfer  Transfers assist     Chair/bed transfer assist level: Moderate Assistance - Patient 50 - 74% (stand pivot)     Locomotion Ambulation   Ambulation assist   Ambulation activity did not occur: Safety/medical concerns (0/5 R LE MMT)  Assist level: 2 helpers Assistive device: Lite Gait Max distance: 1110f   Walk 10 feet activity   Assist  Walk 10 feet activity did not occur: Safety/medical concerns (0/5 R LE MMT)  Assist level: 2 helpers Assistive device: Lite Gait   Walk 50 feet activity   Assist Walk 50 feet with 2 turns activity did not occur: Safety/medical concerns  (0/5 R LE MMT)  Assist level: 2 helpers Assistive device: Lite Gait    Walk 150 feet activity   Assist Walk 150 feet activity did not occur: Safety/medical concerns (0/5 R LE MMT)  Assist level: 2 helpers Assistive device: Lite Gait    Walk 10 feet on uneven surface  activity   Assist Walk 10 feet on uneven surfaces activity did not occur: Safety/medical concerns (0/5 R LE MMT)         Wheelchair     Assist Is the patient using a wheelchair?: Yes Type of Wheelchair: Manual Wheelchair activity did not occur: Safety/medical concerns (Per PT)  Wheelchair assist level: Supervision/Verbal cueing Max wheelchair distance: 1540f   Wheelchair 50 feet with 2 turns activity    Assist        Assist Level: Supervision/Verbal cueing   Wheelchair 150 feet activity     Assist      Assist Level: Supervision/Verbal cueing   Blood pressure 118/83, pulse 70, temperature 98.1 F (36.7 C), temperature source Oral, resp. rate 18, height '5\' 11"'$  (1.803 m), weight 131 kg, last menstrual period 09/29/2020, SpO2 99 %.    Medical Problem List and Plan: 1. Functional deficits secondary to left posterior frontal lobe metastatic breast cancer, s/p resection with right-sided weakness             -patient may shower             -ELOS/Goals: 14-20 days, min assist goals with PT/OT and mod I with SLP             -keep RUE supported given proximal muscle weakness.              -PRAFO to help support/stretch right heel cord. Consider night splint  Continue CIR therapies including PT, OT, SLP 2.  Antithrombotics: -DVT/anticoagulation:  Mechanical:  Antiembolism stockings, knee (TED hose) Bilateral lower extremities Sequential compression devices, below knee Bilateral lower extremities             -check dopplers 5/29- Dopplers still pending- on Lovenox 30 mg q12 hours             -antiplatelet therapy: none 3. Pain Management: Tylenol, Norco as needed  5/29 - pain controlled-  con't regimen 4. Mood: LCSW to evaluate and provide emotional support             -antipsychotic agents: n/a 5. Neuropsych: This patient is capable of making decisions on her own behalf. 6. Craniotomy incision site: staples removed  without complication. Site inspected and hearing well. 7. 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 Will see if can use BSC since will have been seen by therapy at that point.   5/29: 2 large BM's 5/27- con't regimen- use sorbitol prn 11: Sensitivity to Arm [employer] laundry products; is not allergic to baking powder 12: Intermittent elevated BP: continue to monitor --continue clonidine 0.1 mg BID. Consider TID dosing more consistent control 13: Morbid obesity: BMI 43.97; dietary counseling  14. Insomnia: continue melatonin  15. Leukocytosis  5/27- WBC 15.9- but since last labs, have new Decadron 4 mg q6 hours- afebrile- no signs/Sx's of illness- will recheck Monday and monitor  5/29 WBC is 16.5- discussed with patient that this has been stable.   5/31: discuss with NSGY reducing steroids.  16. Hyperglycemia  5/28- started sensitive SSI and CBGs since had BG of 207 on BMP yesterday.  5/29- discussed how decadron will affects cbg's. Hopefully with taper we'll see improvement. Sugars still high this morning  5/30: start metformin '500mg'$  daily.   5/31: increase metformin to '500mg'$  BID.   6/1: increase metformin to '850mg'$  17. Rash on chest: hydrocortisone cream  ordered          LOS: 7 days A FACE TO FACE EVALUATION WAS PERFORMED  Pheng Prokop P Cerys Winget 11/24/2021, 11:13 AM

## 2021-11-24 NOTE — Progress Notes (Signed)
Occupational Therapy Session Note  Patient Details  Name: Sheryl Mcmillan MRN: [id number] Date of Birth: 1975-12-14  Today's Date: 11/24/2021 OT Individual Time: 9242-6834 OT Individual Time Calculation (min): 55 min    Short Term Goals: Week 1:  OT Short Term Goal 1 (Week 1): Pt will consistently transfer to toilet wiht MIN A and LRAD OT Short Term Goal 2 (Week 1): Pt will bathe 8/10 body parts with AE PRN OT Short Term Goal 3 (Week 1): Pt will recall hemi dressing techiques with no cuing OT Short Term Goal 4 (Week 1): Pt will complete 1/3 components of toileting  Skilled Therapeutic Interventions/Progress Updates:  Skilled OT intervention completed with focus on ADL retraining and AE education, functional endurance. Pt received upright in bed, no c/o pain, requesting to shower. Pt reporting she can lift her R leg at the hip today, even though she has limited ankle movement!!! Completed bed mobility with mod I, with pt utilizing hook method for RLE to EOB. Sit > stand in stedy with supervision using BUE to pull up with bar, then dependent transfer to shower with supervision for sitting balance. Supervision for descent to bench. Cranial staples removed therefore hair washing permitted this session. All bathing completed seated with set up A using LH sponge including for buttocks. Education provided on modification method for drying feet with long towel for increased safety prior to standing from shower. Sit > stand with CGA in stedy then dependent transfer to sink in stedy, completing brushing teeth and hair combing with supervision for sitting balance.  Dependent transfer to EOB, with education about how to donn clothes after shower at bed level for maximum independence, with pt preferring this method. Pt utilizing active hip flexion progression during ADL tasks, with education provided about letting gravity assist and ways to maximize dressing tasks. Donned shirt at EOB with supervision, then laid  back for threading of shorts with pt utilizing figure 4 leg positioning to donn shorts, overall min A needed for reaching behind R side to donn over buttocks. Education provided about use of LH shoe horn and sock aid, with pt able to donn socks at bed level with figure 4 position at supervision level, then sit EOB for mod A donning of shoes/ R GRAFO with use of shoe horn.   Sit > stand using RW with min A, then mod A stand pivot to w/c with therapist physically blocking R knee during transfer and mod A needed for balance as pt very excited and needed cues to stay focused and think through steps. RLE with lag and lack of faciliation back towards chair with w/c brought up to pt for safety. Pt was left seated in w/c, with chair pad alarm and all needs in reach at end of session.   Therapy Documentation Precautions:  Precautions Precautions: Fall Restrictions Weight Bearing Restrictions: No    Therapy/Group: Individual Therapy  Bernadett Milian E Atonya Templer 11/24/2021, 7:24 AM

## 2021-11-24 NOTE — Progress Notes (Signed)
Physical Therapy Session Note  Patient Details  Name: Sheryl Mcmillan MRN: [id number] Date of Birth: May 15, 1976  Today's Date: 11/24/2021 PT Individual Time: 6948-5462 PT Individual Time Calculation (min): 55 min   Short Term Goals: Week 1:  PT Short Term Goal 1 (Week 1): Pt will transfer bed<>chair with CGA PT Short Term Goal 2 (Week 1): Pt will complete sit to stand from chair height with MinA + RW PT Short Term Goal 3 (Week 1): Pt will complete amb of 10 ft with ModA + RW  Skilled Therapeutic Interventions/Progress Updates:  Received pt sitting in WC with R GRAFO donned. Pt agreeable to PT treatment and denied any pain during session. Session with emphasis on functional mobility/transfers, generalized strengthening and endurance, dynamic standing balance/coordination, and gait training. Pt performed WC mobility 150f x2 trials using R hemi technique and supervision/mod I to/from dayroom. Donned LiteGait harness sitting and stood with mod A to finish donning harness. Pt ambulated 2469fx 2 trials in LiteGait with min/mod A and 2nd person steering LiteGait. Pt did not require shoe cover this afternoon and was able to step RLE back and activate hip extensors when standing in LiteGait to rest! Pt able to advance RLE but requires continued assist to facilitate ankle and knee IR. Pt transferred on/off Nustep semi-stand<>pivot with min/mod A. Pt performed BUE/LE strengthening on Nustep at workload 1 for 10 minutes for a total of 632 steps with emphasis on cardiovascular endurance and reciprocal movement training. Attached R hip adduction piece and cued pt to prevent excessive hip abduction. Provided pt with grn TB to continue working on hip abduction in room. [facility]   Therapy Documentation Precautions:  Precautions Precautions: Fall Restrictions Weight Bearing Restrictions: No  Therapy/Group: Individual Therapy AnAlfonse AlpersT, DPT  11/24/2021, 7:18 AM

## 2021-11-25 LAB — GLUCOSE, CAPILLARY
Glucose-Capillary: 193 mg/dL — ABNORMAL HIGH (ref 70–99)
Glucose-Capillary: 169 mg/dL — ABNORMAL HIGH (ref 70–99)
Glucose-Capillary: 185 mg/dL — ABNORMAL HIGH (ref 70–99)
Glucose-Capillary: 199 mg/dL — ABNORMAL HIGH (ref 70–99)

## 2021-11-25 NOTE — Progress Notes (Addendum)
Patient refuses bed alarm. Patient had large BM.

## 2021-11-25 NOTE — Progress Notes (Signed)
PROGRESS NOTE   Subjective/Complaints: Continues to have red bumps on chest, feels like it may be a medication reaction- these do not hurt or cause pruritis.  Working with therapy  ROS: Patient denies fever, rash, sore throat, blurred vision, dizziness, nausea, vomiting, diarrhea, cough, shortness of breath or chest pain, joint or back/neck pain, headache, or mood change.   Objective:   No results found. Recent Labs    11/24/21 0559  WBC 12.0*  HGB 13.4  HCT 39.9  PLT 234    Recent Labs    11/24/21 0559  NA 134*  K 4.0  CL 104  CO2 21*  GLUCOSE 217*  BUN 20  CREATININE 0.72  CALCIUM 8.8*     Intake/Output Summary (Last 24 hours) at 11/25/2021 1635 Last data filed at 11/25/2021 0817 Gross per 24 hour  Intake 480 ml  Output --  Net 480 ml        Physical Exam: Vital Signs Blood pressure 134/89, pulse (!) 106, temperature 98.6 F (37 C), temperature source Oral, resp. rate 17, height '5\' 11"'$  (1.803 m), weight 131 kg, last menstrual period 09/29/2020, SpO2 99 %. Constitutional: No distress . Vital signs reviewed. BMI 40.28 HEENT: NCAT, EOMI, oral membranes moist Neck: supple Cardiovascular: Tachycardia Respiratory/Chest: CTA Bilaterally without wheezes or rales. Normal effort    GI/Abdomen: BS +, non-tender, non-distended Ext: no clubbing, cyanosis, or edema Psych: pleasant and cooperative  Skin: incision CDI on scalp, rash on chest Musculoskeletal:        General: No swelling.     Comments: Right heel cord is tight  Can sit to stand in [place] Neurological:     Mental Status: She is alert.     Motor: Weakness present.     Comments: Alert and oriented x 3. Normal insight and awareness. Intact Memory. Normal language and speech. Cranial nerve exam unremarkable for mild right central 7 still present. RUE 0/5 deltoids, 2+ to 3/5 biceps, triceps  and 4-5/5 wrist and fingers. RLE: 1/5 HF/HAB and 0/5 elsewhere.  DTR's 1+. Sensory exam normal for light touch and pain in all 4 limbs. No limb ataxia or cerebellar signs. No abnormal tone appreciated.       Assessment/Plan: 1. Functional deficits which require 3+ hours per day of interdisciplinary therapy in a comprehensive inpatient rehab setting. Physiatrist is providing close team supervision and 24 hour management of active medical problems listed below. Physiatrist and rehab team continue to assess barriers to discharge/monitor patient progress toward functional and medical goals  Care Tool:  Bathing    Body parts bathed by patient: Right arm, Chest, Left arm, Abdomen, Front perineal area, Right upper leg, Left upper leg, Right lower leg, Left lower leg, Face, Buttocks   Body parts bathed by helper: Buttocks     Bathing assist Assist Level: Set up assist     Upper Body Dressing/Undressing Upper body dressing   What is the patient wearing?: Dress    Upper body assist Assist Level: Supervision/Verbal cueing (in stedy)    Lower Body Dressing/Undressing Lower body dressing      What is the patient wearing?: Pants     Lower body assist Assist for  lower body dressing: Minimal Assistance - Patient > 75% (bed level)     Toileting Toileting    Toileting assist Assist for toileting: Dependent - Patient 0% (stedy)     Transfers Chair/bed transfer  Transfers assist     Chair/bed transfer assist level: Moderate Assistance - Patient 50 - 74% (stand pivot)     Locomotion Ambulation   Ambulation assist   Ambulation activity did not occur: Safety/medical concerns (0/5 R LE MMT)  Assist level: 2 helpers Assistive device: Lite Gait Max distance: 165f   Walk 10 feet activity   Assist  Walk 10 feet activity did not occur: Safety/medical concerns (0/5 R LE MMT)  Assist level: 2 helpers Assistive device: Lite Gait   Walk 50 feet activity   Assist Walk 50 feet with 2 turns activity did not occur: Safety/medical concerns  (0/5 R LE MMT)  Assist level: 2 helpers Assistive device: Lite Gait    Walk 150 feet activity   Assist Walk 150 feet activity did not occur: Safety/medical concerns (0/5 R LE MMT)  Assist level: 2 helpers Assistive device: Lite Gait    Walk 10 feet on uneven surface  activity   Assist Walk 10 feet on uneven surfaces activity did not occur: Safety/medical concerns (0/5 R LE MMT)         Wheelchair     Assist Is the patient using a wheelchair?: Yes Type of Wheelchair: Manual Wheelchair activity did not occur: Safety/medical concerns (Per PT)  Wheelchair assist level: Supervision/Verbal cueing Max wheelchair distance: 1535f   Wheelchair 50 feet with 2 turns activity    Assist        Assist Level: Supervision/Verbal cueing   Wheelchair 150 feet activity     Assist      Assist Level: Supervision/Verbal cueing   Blood pressure 134/89, pulse (!) 106, temperature 98.6 F (37 C), temperature source Oral, resp. rate 17, height '5\' 11"'$  (1.803 m), weight 131 kg, last menstrual period 09/29/2020, SpO2 99 %.    Medical Problem List and Plan: 1. Functional deficits secondary to left posterior frontal lobe metastatic breast cancer, s/p resection with right-sided weakness             -patient may shower             -ELOS/Goals: 14-20 days, min assist goals with PT/OT and mod I with SLP             -keep RUE supported given proximal muscle weakness.              -PRAFO to help support/stretch right heel cord. Consider night splint  Continue CIR therapies including PT, OT, SLP 2.  Antithrombotics: -DVT/anticoagulation:  Mechanical:  Antiembolism stockings, knee (TED hose) Bilateral lower extremities Sequential compression devices, below knee Bilateral lower extremities             -check dopplers 5/29- Dopplers still pending- on Lovenox 30 mg q12 hours             -antiplatelet therapy: none 3. Pain Management: Tylenol, Norco as needed  5/29 - pain  controlled- con't regimen 4. Mood: LCSW to evaluate and provide emotional support             -antipsychotic agents: n/a 5. Neuropsych: This patient is capable of making decisions on her own behalf. 6. Craniotomy incision site: staples removed  without complication. Site inspected and hearing well. 7. Fluids/Electrolytes/Nutrition: Routine Is and Os and follow-up chemistries 8: Recurrent metastatic breast  cancer with brain lesion s/p resection             --decrease Decadron 4 mg q 8 hours             --discontinue Keppra 500 mg q 12 since has been 2 weeks post-surgery seizure free 9: GERD: continue Protonix 10: Constipation: getting Miralax daily and Dulcolax supp PRN             --consider milk of magnesia/Fleets if needed             -last bm 5/24  5/27- pt reports last "decent BM" was 5/16- so will give Sorbitol 60cc after therapy. Will see if can use BSC since will have been seen by therapy at that point.   5/29: 2 large BM's 5/27- con't regimen- use sorbitol prn 11: Sensitivity to Arm and Hammer laundry products; is not allergic to baking powder 12: Intermittent elevated BP: continue to monitor --continue clonidine 0.1 mg BID. Consider TID dosing more consistent control 13: Morbid obesity: BMI 43.97; dietary counseling  14. Insomnia: continue melatonin  15. Leukocytosis  5/27- WBC 15.9- but since last labs, have new Decadron 4 mg q6 hours- afebrile- no signs/Sx's of illness- will recheck Monday and monitor  5/29 WBC is 16.5- discussed with patient that this has been stable.   5/31: discuss with NSGY reducing steroids.  16. Hyperglycemia  5/28- started sensitive SSI and CBGs since had BG of 207 on BMP yesterday.  5/29- discussed how decadron will affects cbg's. Hopefully with taper we'll see improvement. Sugars still high this morning  5/30: start metformin '500mg'$  daily.   5/31: increase metformin to '500mg'$  BID.   6/1: increase metformin to '850mg'$   6/3 please check with NSGY on Monday  if it would be ok to taper steroids to '2mg'$  q8H.  17. Rash on chest: hydrocortisone cream ordered, continue 18. Tachycardia: discuss adding magnesium gluconate '250mg'$  HS          LOS: 8 days A FACE TO FACE EVALUATION WAS PERFORMED  Sheryl Mcmillan 11/25/2021, 4:35 PM

## 2021-11-25 NOTE — Progress Notes (Signed)
Physical Therapy Session Note  Patient Details  Name: Sheryl Mcmillan MRN: [id number] Date of Birth: [birth year]  Today's Date: 11/25/2021 PT Individual Time: 0800-0829 AND [id number] PT Individual Time Calculation (min): 29 min AND 71mn  Short Term Goals: Week 1:  PT Short Term Goal 1 (Week 1): Pt will transfer bed<>chair with CGA PT Short Term Goal 2 (Week 1): Pt will complete sit to stand from chair height with MinA + RW PT Short Term Goal 3 (Week 1): Pt will complete amb of 10 ft with ModA + RW Week 2:    Week 3:     Skilled Therapeutic Interventions/Progress Updates:    SESSION ONE Pt initially supine and dressing herself Therex as follows: Bridging w/RLE bias x 10 w/5 s4ec hold and emphasis on LE and pelis maintained at midline Clamshells w/RTB resistance and emphasis on RLE control x 10 Hip abd/add x 10 - pt substitues hip flex and ER Heel slides x 10  Supine to sit w/supervision and use of rails.  Pt dons socks and L shoe w/supervision, total assist for RAFO/shoe  Lateral scoot bed to wc w/set up and cga. Pt left oob in wc needs in reach, refuses alarm belt.    SESSION TWO Focus on RLE NMRE in Litegait/gait training. Harness donned total assist w/pt in sitting/standing.  Mod assist for Sit to stand, therapist blocking/stabilizing RLE w/transition.  Gait trials:              Ft,   3563fw focus on eleiminating "whip" RLE which occurs due to hip ER at terminal stance - preswing resulting in catching R heel on LLE thru swing/at times causing intteruption of swing midway and loading of limb/buckling of knee.    Demonstrated gait impairment to pt between trials and related movement to clamshell performed earlier w/focus on hip IR.  Pt w/significantly improved gait w/focus and decreased cadence second trial.  Standing forward leaning on hi/lo table in high position worked on RLE hip and knee flexion/hip and knee extension repeated w/AAROM.  Attempted isolated hip extension and  isolated knee flexion but unable to elicit glutes or hams in isolation, fires both in pattern.    Pt propels mod I to/from gym. Pt left oob in wc, declines alarm belt, needs in reach   Therapy Documentation Precautions:  Precautions Precautions: Fall Restrictions Weight Bearing Restrictions: No    Therapy/Group: Individual Therapy BaCallie FieldingPT   BaJerrilyn Cairo/08/2021, 8:32 AM

## 2021-11-26 LAB — GLUCOSE, CAPILLARY
Glucose-Capillary: 402 mg/dL — ABNORMAL HIGH (ref 70–99)
Glucose-Capillary: 169 mg/dL — ABNORMAL HIGH (ref 70–99)
Glucose-Capillary: 164 mg/dL — ABNORMAL HIGH (ref 70–99)
Glucose-Capillary: 230 mg/dL — ABNORMAL HIGH (ref 70–99)

## 2021-11-26 MED ORDER — INSULIN ASPART 100 UNIT/ML IJ SOLN
10.0000 [IU] | Freq: Once | INTRAMUSCULAR | Status: AC
  Administered 2021-11-26: 10 [IU] via SUBCUTANEOUS

## 2021-11-26 MED ORDER — RIVAROXABAN 10 MG PO TABS
10.0000 mg | ORAL_TABLET | Freq: Every day | ORAL | Status: DC
  Administered 2021-11-26 – 2021-12-08 (×13): 10 mg via ORAL
  Filled 2021-11-26 (×13): qty 1

## 2021-11-26 NOTE — Progress Notes (Signed)
PROGRESS NOTE   Subjective/Complaints: Discussed her CBG above 400 this morning. She is an early rise and woke at 3:30am and had grapes and hot tea with honey and 4. She asked nursing staff to draw CBG before she ate but was told they could only draw it at 6am.   ROS: Patient denies fever, rash, sore throat, blurred vision, dizziness, nausea, vomiting, diarrhea, cough, shortness of breath or chest pain, joint or back/neck pain, headache, or mood change.   Objective:   No results found. Recent Labs    11/24/21 0559  WBC 12.0*  HGB 13.4  HCT 39.9  PLT 234    Recent Labs    11/24/21 0559  NA 134*  K 4.0  CL 104  CO2 21*  GLUCOSE 217*  BUN 20  CREATININE 0.72  CALCIUM 8.8*     Intake/Output Summary (Last 24 hours) at 11/26/2021 1433 Last data filed at 11/26/2021 1308 Gross per 24 hour  Intake 240 ml  Output --  Net 240 ml        Physical Exam: Vital Signs Blood pressure 124/72, pulse 88, temperature 98.3 F (36.8 C), temperature source Oral, resp. rate 17, height '5\' 11"'$  (1.803 m), weight 131 kg, last menstrual period 09/29/2020, SpO2 100 %. Constitutional: No distress . Vital signs reviewed. BMI 40.28 HEENT: NCAT, EOMI, oral membranes moist Neck: supple Cardiovascular: Tachycardia Respiratory/Chest: CTA Bilaterally without wheezes or rales. Normal effort    GI/Abdomen: BS +, non-tender, non-distended Ext: no clubbing, cyanosis, or edema Psych: pleasant and cooperative  Skin: incision CDI on scalp, rash on chest has improved Musculoskeletal:        General: No swelling.     Comments: Right heel cord is tight  Can sit to stand in [place] [facility]:     Mental Status: She is alert.     Motor: Weakness present.     Comments: Alert and oriented x 3. Normal insight and awareness. Intact Memory. Normal language and speech. Cranial nerve exam unremarkable for mild right central 7 still present. RUE 0/5 deltoids,  2+ to 3/5 biceps, triceps  and 4-5/5 wrist and fingers. RLE: 1/5 HF/HAB and 0/5 elsewhere. DTR's 1+. Sensory exam normal for light touch and pain in all 4 limbs. No limb ataxia or cerebellar signs. No abnormal tone appreciated.       Assessment/Plan: 1. Functional deficits which require 3+ hours per day of interdisciplinary therapy in a comprehensive inpatient rehab setting. Physiatrist is providing close team supervision and 24 hour management of active medical problems listed below. Physiatrist and rehab team continue to assess barriers to discharge/monitor patient progress toward functional and medical goals  Care Tool:  Bathing    Body parts bathed by patient: Right arm, Chest, Left arm, Abdomen, Front perineal area, Right upper leg, Left upper leg, Right lower leg, Left lower leg, Face, Buttocks   Body parts bathed by helper: Buttocks     Bathing assist Assist Level: Set up assist     Upper Body Dressing/Undressing Upper body dressing   What is the patient wearing?: Dress    Upper body assist Assist Level: Supervision/Verbal cueing (in stedy)    Lower Body Dressing/Undressing Lower  body dressing      What is the patient wearing?: Pants     Lower body assist Assist for lower body dressing: Minimal Assistance - Patient > 75% (bed level)     Toileting Toileting    Toileting assist Assist for toileting: Dependent - Patient 0% (stedy)     Transfers Chair/bed transfer  Transfers assist     Chair/bed transfer assist level: Moderate Assistance - Patient 50 - 74% (stand pivot)     Locomotion Ambulation   Ambulation assist   Ambulation activity did not occur: Safety/medical concerns (0/5 R LE MMT)  Assist level: 2 helpers Assistive device: Lite Gait Max distance: 180f   Walk 10 feet activity   Assist  Walk 10 feet activity did not occur: Safety/medical concerns (0/5 R LE MMT)  Assist level: 2 helpers Assistive device: Lite Gait   Walk 50 feet  activity   Assist Walk 50 feet with 2 turns activity did not occur: Safety/medical concerns (0/5 R LE MMT)  Assist level: 2 helpers Assistive device: Lite Gait    Walk 150 feet activity   Assist Walk 150 feet activity did not occur: Safety/medical concerns (0/5 R LE MMT)  Assist level: 2 helpers Assistive device: Lite Gait    Walk 10 feet on uneven surface  activity   Assist Walk 10 feet on uneven surfaces activity did not occur: Safety/medical concerns (0/5 R LE MMT)         Wheelchair     Assist Is the patient using a wheelchair?: Yes Type of Wheelchair: Manual Wheelchair activity did not occur: Safety/medical concerns (Per PT)  Wheelchair assist level: Supervision/Verbal cueing Max wheelchair distance: 156f   Wheelchair 50 feet with 2 turns activity    Assist        Assist Level: Supervision/Verbal cueing   Wheelchair 150 feet activity     Assist      Assist Level: Supervision/Verbal cueing   Blood pressure 124/72, pulse 88, temperature 98.3 F (36.8 C), temperature source Oral, resp. rate 17, height '5\' 11"'$  (1.803 m), weight 131 kg, last menstrual period 09/29/2020, SpO2 100 %.    Medical Problem List and Plan: 1. Functional deficits secondary to left posterior frontal lobe metastatic breast cancer, s/p resection with right-sided weakness             -patient may shower             -ELOS/Goals: 14-20 days, min assist goals with PT/OT and mod I with SLP             -keep RUE supported given proximal muscle weakness.              -PRAFO to help support/stretch right heel cord. Consider night splint  Continue CIR therapies including PT, OT, SLP  Provided with podcast recommendations to learn more about role of diet in cancer prevention/treatment  [facility] get [facility] opinion on treatment options 2.  S/p craniotomy with impaired mobility -DVT/anticoagulation:  Mechanical:   Antiembolism stockings, knee (TED hose) Bilateral lower extremities Sequential compression devices, below knee Bilateral lower extremities -switch to Xarelto due to abdominal bruising from Loevnox injections. Ask NSGY for how long they would like anticoagulation 3. Postoperative pain: Tylenol, Norco as needed. Provided with pain relief journal.  4. Mood: LCSW to evaluate and provide emotional support             -antipsychotic agents: n/a 5. Neuropsych:  This patient is capable of making decisions on her own behalf. 6. Craniotomy incision site: staples removed  without complication. Site inspected and hearing well. 7. 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 Will see if can use BSC since will have been seen by therapy at that point.   5/29: 2 large BM's 5/27- con't regimen- use sorbitol prn 11: Sensitivity to [employer] laundry products; is not allergic to baking powder 12: Intermittent elevated BP: continue to monitor --continue clonidine 0.1 mg BID. Consider TID dosing more consistent control 13: Morbid obesity: BMI 43.97; dietary counseling  14. Insomnia: continue melatonin  15. Leukocytosis  5/27- WBC 15.9- but since last labs, have new Decadron 4 mg q6 hours- afebrile- no signs/Sx's of illness- will recheck Monday and monitor  5/29 WBC is 16.5- discussed with patient that this has been stable.   5/31: discuss with NSGY reducing steroids.  16. Hyperglycemia  5/28- started sensitive SSI  and CBGs since had BG of 207 on BMP yesterday.  5/29- discussed how decadron will affects cbg's. Hopefully with taper we'll see improvement. Sugars still high this morning  5/30: start metformin '500mg'$  daily.   5/31: increase metformin to '500mg'$  BID.   6/1: increase metformin to '850mg'$   6/3 please check with NSGY on Monday if it would be ok to taper steroids to '2mg'$  q8H.  17. Rash on chest: hydrocortisone cream ordered, continue 18. Tachycardia: discuss adding magnesium gluconate '250mg'$  HS          LOS: 9 days A FACE TO FACE EVALUATION WAS PERFORMED  Sheryl Mcmillan Sheryl Mcmillan 11/26/2021, 2:33 PM

## 2021-11-26 NOTE — Progress Notes (Signed)
ANTICOAGULATION CONSULT NOTE - Initial Consult  Pharmacy Consult for lovenox to rivaroxaban Indication: VTE prophylaxis  Allergies  Allergen Reactions   Carboplatin Hives and Itching    Carboplatin reaction after dose #5 requiring Benadryl, Solumedrol, epinephrine, Claritin.   Other Hives    Baking Soda   Wound Dressing Adhesive Hives    Rips skin, prefers paper tape    Patient Measurements: Height: '5\' 11"'$  (180.3 cm) Weight: 131 kg (288 lb 12.8 oz) IBW/kg (Calculated) : 70.8  Vital Signs: Temp: 97.7 F (36.5 C) (06/04 0344) BP: 126/88 (06/04 0344) Pulse Rate: 65 (06/04 0344)  Labs: Recent Labs    11/24/21 0559  HGB 13.4  HCT 39.9  PLT 234  CREATININE 0.72    Estimated Creatinine Clearance: 134.4 mL/min (by C-G formula based on SCr of 0.72 mg/dL).   Medical History: Past Medical History:  Diagnosis Date   Arthritis    Breast cancer ([place])    Family history of non-Hodgkin's lymphoma 06/01/2020   GERD (gastroesophageal reflux disease)    Hemorrhoids    Migraines    PCOS (polycystic ovarian syndrome)    PONV (postoperative nausea and vomiting)     Assessment: 78 YOF presenting to CIR for functional deficits secondary to brain tumor s/p excision craniotomy on 5/19. 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 Patient was started on enoxaparin 30 mg q 12 hours for prophylaxis post-surgery. Pharmacy consulted to transition patient to rivaroxaban secondary to bruising from enoxaparin.   Hgb and platelets stable on CBC from 6/2. No signs of bleeding noted. Renal function is normal.    Plan:  Stop enoxaparin 30 mg q 12 hours Start rivaroxaban 10 mg PO daily Monitor for signs and symptoms of bleeding F/u plans for long-term VTE prophylaxis Pharmacy will monitor peripherally  Thank you for involving pharmacy in this patient's care.  Elita Quick, PharmD PGY1 Ambulatory Care Pharmacy Resident 11/26/2021 1:29 PM  **Pharmacist phone  directory can be found on [email or web address] listed under [place]**

## 2021-11-26 NOTE — Progress Notes (Signed)
Patient's CBG was 402. Luetta Nutting NP notified. 10 units was ordered to cover. Will pass on to day shift and monitor.   Gladstone Lighter, LPN

## 2021-11-26 NOTE — Progress Notes (Signed)
Received call from bedside nurse [place], for CBG 402 this am.  Consulted with Dr. Ranell Patrick, MD with recommendations given by Dr. Ranell Patrick, MD to give 10U SSI.  Called nurse [place] and provided these recommendations.

## 2021-11-27 DIAGNOSIS — K5900 Constipation, unspecified: Secondary | ICD-10-CM

## 2021-11-27 LAB — GLUCOSE, CAPILLARY
Glucose-Capillary: 167 mg/dL — ABNORMAL HIGH (ref 70–99)
Glucose-Capillary: 160 mg/dL — ABNORMAL HIGH (ref 70–99)
Glucose-Capillary: 120 mg/dL — ABNORMAL HIGH (ref 70–99)
Glucose-Capillary: 190 mg/dL — ABNORMAL HIGH (ref 70–99)

## 2021-11-27 NOTE — Progress Notes (Signed)
Occupational Therapy Session Note  Patient Details  Name: Sheryl Mcmillan MRN: [id number] Date of Birth: Jan 18, 1976  Today's Date: 11/27/2021 OT Individual Time: 7681-1572 OT Individual Time Calculation (min): 40 min    Short Term Goals: Week 1:  OT Short Term Goal 1 (Week 1): Pt will consistently transfer to toilet wiht MIN A and LRAD OT Short Term Goal 2 (Week 1): Pt will bathe 8/10 body parts with AE PRN OT Short Term Goal 3 (Week 1): Pt will recall hemi dressing techiques with no cuing OT Short Term Goal 4 (Week 1): Pt will complete 1/3 components of toileting  Skilled Therapeutic Interventions/Progress Updates:  Skilled OT intervention completed with focus on toilet transfers, toileting and safety education. Pt received seated in w/c, no c/o pain, agreeable to session. Retrieved DABBSC to progress toilet transfers. With education and demonstration provided pt completed lateral scoot > L to BSC with CGA. Safety cues needed for R hand throughout as pt found to be holding onto w/c wheel and extended brake with chair coming unlocked during transfer and min A needed to correct. Education provided on Air cabin crew for pericare, wiping techniques, safety considerations. Pt reporting posterior pericare has been hard for her since the mastectomy surgery. Advised pt to avoid standing with nursing at this time during toileting, however updated transfer sheet to lateral scoot. Completed lateral scoot/squat pivot > R to w/c with min/mod A, as pt with semi-stand vs shifting of hips. Would benefit from continued practice with transfers > R, with education provided about ways to set up [facility] to be able to transfer R the whole way. Cues needed to slow down throughout session for safety, with pt expressing her struggle with her surgery journey and how hard it's been coping with her deficits. Emotional support provided to pt with education provided about her progression thus far and the return of  mobility/strength with brain tumor resection. Pt was left seated in w/c, with chair alarm on and all needs in reach at end of session.     Therapy Documentation Precautions:  Precautions Precautions: Fall Restrictions Weight Bearing Restrictions: No    Therapy/Group: Individual Therapy  Ivalene Platte E Jerric Oyen 11/27/2021, 7:34 AM

## 2021-11-27 NOTE — Progress Notes (Signed)
Patient ID: Sheryl Mcmillan, female   DOB: 26-Feb-1976, 46 y.o.   MRN: [id number]  SW followed up with patient to provide conference updates from last week. Patient reports she was able to view them on MyChart. Sw informed patient of returning today. Sw informed patient that updates will be provided this upcoming Wednesday during lunch time.

## 2021-11-27 NOTE — Progress Notes (Signed)
Physical Therapy Weekly Progress Note  Patient Details  Name: Sheryl Mcmillan MRN: [id number] Date of Birth: December 16, 1975  Beginning of progress report period: Nov 18, 2021 End of progress report period: November 27, 2021  Patient has met 1 of 3 short term goals. Pt demonstrates steady progress towards long term goals and remains extremely motivated during therapy sessions. Pt is currently able to transfer supine<>sitting EOB with supervision/mod I and perform lateral scoot transfers with CGA/light min A overall but requires mod A and blocking at R knee for stand<>pivot transfers. Pt is able to perform sit<>stands with/without RW and min/mod A and ambulate up to 243f in LiteGait with mod A of 1 (+2 for equipment management). Pt continues to be limited by R hemiparesis, mild impulsivity, fatigue, generalized weakness/deconditioning, and body habitus. Pt will most likely benefit from AFO consult prior to discharge.   Patient continues to demonstrate the following deficits muscle weakness, decreased cardiorespiratoy endurance, impaired timing and sequencing, unbalanced muscle activation, and decreased coordination, and decreased standing balance, decreased postural control, hemiplegia, and decreased balance strategies and therefore will continue to benefit from skilled PT intervention to increase functional independence with mobility.  Patient progressing toward long term goals..  Continue plan of care.  PT Short Term Goals Week 1:  PT Short Term Goal 1 (Week 1): Pt will transfer bed<>chair with CGA PT Short Term Goal 1 - Progress (Week 1): Met PT Short Term Goal 2 (Week 1): Pt will complete sit to stand from chair height with MinA + RW PT Short Term Goal 2 - Progress (Week 1): Progressing toward goal PT Short Term Goal 3 (Week 1): Pt will complete amb of 10 ft with ModA + RW PT Short Term Goal 3 - Progress (Week 1): Progressing toward goal Week 2:  PT Short Term Goal 1 (Week 2): pt will transfer  sit<>stand with LRAD and min A PT Short Term Goal 2 (Week 2): pt will transfer stand<>pivot with LRAD and mod A PT Short Term Goal 3 (Week 2): Pt will initate gait training with RW  Skilled Therapeutic Interventions/Progress Updates:  Ambulation/gait training;Balance/vestibular training;Community reintegration;Discharge planning;Disease management/prevention;DME/adaptive equipment instruction;Functional mobility training;Functional electrical stimulation;Neuromuscular re-education;Patient/family education;Pain management;Psychosocial support;Stair training;Therapeutic Activities;Therapeutic Exercise;UE/LE Strength taining/ROM;UE/LE Coordination activities;Wheelchair propulsion/positioning   Therapy Documentation Precautions:  Precautions Precautions: Fall Restrictions Weight Bearing Restrictions: No  Therapy/Group: Individual Therapy AAlfonse AlpersPT, DPT  11/27/2021, 6:48 AM

## 2021-11-27 NOTE — Progress Notes (Signed)
Physical Therapy Session Note  Patient Details  Name: Sheryl Mcmillan MRN: [id number] Date of Birth: 1975-06-29  Today's Date: 11/27/2021 PT Individual Time: 8882-8003 and 4917-9150 PT Individual Time Calculation (min): 70 min and 27 min  Short Term Goals: Week 1:  PT Short Term Goal 1 (Week 1): Pt will transfer bed<>chair with CGA PT Short Term Goal 1 - Progress (Week 1): Met PT Short Term Goal 2 (Week 1): Pt will complete sit to stand from chair height with MinA + RW PT Short Term Goal 2 - Progress (Week 1): Progressing toward goal PT Short Term Goal 3 (Week 1): Pt will complete amb of 10 ft with ModA + RW PT Short Term Goal 3 - Progress (Week 1): Progressing toward goal Week 2:  PT Short Term Goal 1 (Week 2): pt will transfer sit<>stand with LRAD and min A PT Short Term Goal 2 (Week 2): pt will transfer stand<>pivot with LRAD and mod A PT Short Term Goal 3 (Week 2): Pt will initate gait training with RW  Skilled Therapeutic Interventions/Progress Updates:  Treatment Session 1 Received pt sitting in WC, pt agreeable to PT treatment, and denied any pain during session. Pt reported getting dressed and putting on R GRAFO independently this morning. Session with emphasis on functional mobility/transfers, generalized strengthening and endurance, dynamic standing balance/coordination, and gait training. Pt sat in [place] at sink and washed face and brushed teeth with set up assist. Pt performed WC mobility 11f x 2 trials using R hemi technique and supervision/mod I to/from room. Pt able to safely set up transfer to mat without assist and transferred WC<>mat via lateral scoot to L with close supervision. Donned MaxiSky harness seated, then stood with RW and min/mod A to complete donning harness - cues to push with 1 UE from mat and 1 UE on RW. Pt ambulated 2x10 laps in MD[place]without UE support and 2x10 laps using RW with therapist providing min/mod A - emphasis on technique and RLE foot placement with  turning. Pt with increased LOB when turning R>L and with increased difficulty managing RW (would benefit from continued practice). Pt able to advance RLE without assist and with improvements in ability to correct R knee/ankle ER with only verbal cues. Pt did require cues for stepping sequence and safety when turning and is easily distracted, requiring min cues for focused attention to task. Pt transferred mat<>WC to R via squat/stand<>pivot with min A and cues for technique/safety. [facility] reach.   Treatment Session 2 Received pt sitting in WC, pt agreeable to PT treatment, and denied any pain during session. Session with emphasis on WC mobility and HEP management. Pt performed WC mobility 101fx 2 trials using R hemi technique mod I to/from dayroom. Provided pt with HEP and educated pt on frequency/duration/technique for the following exercises: - Seated Hip Adduction Isometrics with Ball  - 1 x daily - 7 x weekly - 4 sets - 10 reps - 5 hold - Seated Hip Abduction with Resistance  - 1 x daily - 7 x weekly - 4 sets - 10 reps - Seated March with Resistance  - 1 x daily - 7 x weekly - 4 sets - 10 reps - Seated Long Arc Quad  - 1 x daily - 7 x weekly - 4 sets - 10 reps Recreational therapy present and discussed pt's goals with ultimate goal to "walk out of here". Pt may be good candidate for  outing while in rehab. Also discussed importance of advocating for herself when not in therapy sessions. Concluded session with pt sitting in [facility] with all needs within reach.   Therapy Documentation Precautions:  Precautions Precautions: Fall Restrictions Weight Bearing Restrictions: No  Therapy/Group: Individual Therapy Alfonse Alpers PT, DPT  11/27/2021, 6:51 AM

## 2021-11-27 NOTE — Progress Notes (Signed)
Occupational Therapy Session Note  Patient Details  Name: Sheryl Mcmillan MRN: [id number] Date of Birth: January 16, 1976  Today's Date: 11/27/2021 OT Individual Time: 3882-6666 OT Individual Time Calculation (min): 45 min   Short Term Goals: Week 2:  OT Short Term Goal 1 (Week 2): Pt will consistently transfer to toilet wiht MIN A and LRAD OT Short Term Goal 2 (Week 2): Pt will complete sit > stand in prep for ADL task with min A using LRAD OT Short Term Goal 3 (Week 2): Pt will donn LB clothing with AE PRN, with min A  Skilled Therapeutic Interventions/Progress Updates:    Pt greeted seated in wc and agreeable to OT treatment session. OT educated on wall walking for R shoulder strengthening. Pt propelled wc to therapy gym using hemi techniques and supervision. UB there-ex using SciFit arm bike on level 5 for 5 mins x 2 with 1 minute rest break. Pt brought outside via wc for time management. UB there-ex with continued focus on R UE shoulder strength using 2 lb dowel rod. Chest press, bicep curl, triceps press, straight arm raise, and side arm raise. Pt returned to room and left seated in wc with needs met.   Therapy Documentation Precautions:  Precautions Precautions: Fall Restrictions Weight Bearing Restrictions: No Pain:  Denies pain   Therapy/Group: Individual Therapy  Valma Cava 11/27/2021, 3:29 PM

## 2021-11-27 NOTE — Progress Notes (Signed)
PROGRESS NOTE   Subjective/Complaints: Pt discussed that she wakes up early at 3am normally. She eats early at around 4am usually. She could not get CBG done until 6am and this caused issues with her schedule.   ROS: No fever, chills, SOB, CP, vision changes  Objective:   No results found. No results for input(s): WBC, HGB, HCT, PLT in the last 72 hours.   No results for input(s): NA, K, CL, CO2, GLUCOSE, BUN, CREATININE, CALCIUM in the last 72 hours.    Intake/Output Summary (Last 24 hours) at 11/27/2021 1542 Last data filed at 11/27/2021 1219 Gross per 24 hour  Intake 360 ml  Output 700 ml  Net -340 ml         Physical Exam: Vital Signs Blood pressure 118/86, pulse 71, temperature 98.6 F (37 C), resp. rate 16, height '5\' 11"'$  (1.803 m), weight 131 kg, last menstrual period 09/29/2020, SpO2 97 %. Constitutional: No distress . Vital signs reviewed. BMI 40.28 HEENT: NCAT, EOMI, oral membranes moist Neck: supple Cardiovascular: RRR Respiratory/Chest: CTA Bilaterally without wheezes or rales. Normal effort    GI/Abdomen: BS +, non-tender, non-distended Ext: no clubbing, cyanosis, or edema Psych: pleasant and cooperative  Skin: incision CDI on scalp, rash on chest has improved Musculoskeletal:        General: No swelling.     Comments: Right heel cord is tight  Can sit to stand in [place] Neurological:     Mental Status: She is alert.     Motor: Weakness present.     Comments: Alert and oriented x 3. Normal insight and awareness. Intact Memory. Normal language and speech. Cranial nerve exam unremarkable for mild right central 7 still present. RUE 0/5 deltoids, 2+ to 3/5 biceps, triceps  and 4-5/5 wrist and fingers. RLE: 1/5 HF/HAB and 0/5 elsewhere. DTR's 1+. Sensory exam normal for light touch and pain in all 4 limbs. No limb ataxia or cerebellar signs. No abnormal tone appreciated.       Assessment/Plan: 1.  Functional deficits which require 3+ hours per day of interdisciplinary therapy in a comprehensive inpatient rehab setting. Physiatrist is providing close team supervision and 24 hour management of active medical problems listed below. Physiatrist and rehab team continue to assess barriers to discharge/monitor patient progress toward functional and medical goals  Care Tool:  Bathing    Body parts bathed by patient: Right arm, Chest, Left arm, Abdomen, Front perineal area, Right upper leg, Left upper leg, Right lower leg, Left lower leg, Face, Buttocks   Body parts bathed by helper: Buttocks     Bathing assist Assist Level: Set up assist     Upper Body Dressing/Undressing Upper body dressing   What is the patient wearing?: Dress    Upper body assist Assist Level: Supervision/Verbal cueing (in stedy)    Lower Body Dressing/Undressing Lower body dressing      What is the patient wearing?: Pants     Lower body assist Assist for lower body dressing: Minimal Assistance - Patient > 75% (bed level)     Toileting Toileting    Toileting assist Assist for toileting: Dependent - Patient 0% (stedy)     Transfers Chair/bed  transfer  Transfers assist     Chair/bed transfer assist level: Supervision/Verbal cueing (lateral scoot to L)     Locomotion Ambulation   Ambulation assist   Ambulation activity did not occur: Safety/medical concerns (0/5 R LE MMT)  Assist level: Dependent - Patient 0% Assistive device: Maxi Sky Max distance: 468f   Walk 10 feet activity   Assist  Walk 10 feet activity did not occur: Safety/medical concerns (0/5 R LE MMT)  Assist level: Dependent - Patient 0% Assistive device: Maxi Sky   Walk 50 feet activity   Assist Walk 50 feet with 2 turns activity did not occur: Safety/medical concerns (0/5 R LE MMT)  Assist level: Dependent - Patient 0% Assistive device: Maxi Sky    Walk 150 feet activity   Assist Walk 150 feet activity did  not occur: Safety/medical concerns (0/5 R LE MMT)  Assist level: Dependent - Patient 0% Assistive device: Maxi Sky    Walk 10 feet on uneven surface  activity   Assist Walk 10 feet on uneven surfaces activity did not occur: Safety/medical concerns (0/5 R LE MMT)         Wheelchair     Assist Is the patient using a wheelchair?: Yes Type of Wheelchair: Manual Wheelchair activity did not occur: Safety/medical concerns (Per PT)  Wheelchair assist level: Supervision/Verbal cueing Max wheelchair distance: 1566f   Wheelchair 50 feet with 2 turns activity    Assist        Assist Level: Supervision/Verbal cueing   Wheelchair 150 feet activity     Assist      Assist Level: Supervision/Verbal cueing   Blood pressure 118/86, pulse 71, temperature 98.6 F (37 C), resp. rate 16, height '5\' 11"'$  (1.803 m), weight 131 kg, last menstrual period 09/29/2020, SpO2 97 %.    Medical Problem List and Plan: 1. Functional deficits secondary to left posterior frontal lobe metastatic breast cancer, s/p resection with right-sided weakness             -patient may shower             -ELOS/Goals: 14-20 days, min assist goals with PT/OT and mod I with SLP             -keep RUE supported given proximal muscle weakness.              -PRAFO to help support/stretch right heel cord. Consider night splint  Continue CIR therapies including PT, OT, SLP  Provided with podcast recommendations to learn more about role of diet in cancer prevention/treatment  Please refer to new outpatient oncologist at [facility] ([place])or patient to get second opinion on treatment options  -15014fC mobility with R hemi technique   2.  S/p craniotomy with impaired mobility -DVT/anticoagulation:  Mechanical:  Antiembolism stockings, knee (TED hose) Bilateral lower extremities Sequential compression devices, below knee Bilateral lower extremities -switch to Xarelto due to abdominal bruising from Loevnox injections.  Ask NSGY for how long they would like anticoagulation,called NSGY, left message with my information 3. Postoperative pain: Tylenol, Norco as needed. Provided with pain relief journal.  4. Mood: LCSW to evaluate and provide emotional support             -antipsychotic agents: n/a 5. Neuropsych: This patient is capable of making decisions on her own behalf. 6. Craniotomy incision site: staples removed  without complication. Site inspected and hearing well. 7. 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 Will see if can use BSC since will have been seen by therapy at that point.   5/29: 2 large BM's 5/27- con't regimen- use sorbitol prn  6/5 BM yesterday 11: Sensitivity to [employer] laundry products; is not allergic to baking powder 12: Intermittent elevated BP: continue to monitor --continue clonidine 0.1 mg BID. Consider TID dosing more consistent control 13: Morbid obesity: BMI 43.97; dietary counseling  14. Insomnia: continue melatonin  15. Leukocytosis  5/27- WBC 15.9- but since last labs, have new Decadron 4 mg q6 hours- afebrile- no signs/Sx's of illness- will recheck Monday and monitor  5/29 WBC is 16.5- discussed with patient that this has been stable.   5/31: discuss with NSGY reducing steroids. Left message for NSGY with contact info to discuss 16. Hyperglycemia  5/28- started sensitive SSI and CBGs since had BG of 207 on BMP yesterday.  5/29- discussed how decadron will affects cbg's.  Hopefully with taper we'll see improvement. Sugars still high this morning  5/30: start metformin '500mg'$  daily.   5/31: increase metformin to '500mg'$  BID.   6/1: increase metformin to '850mg'$   6/3 please check with NSGY on Monday if it would be ok to taper steroids to '2mg'$  q8H.   6/5 adjusted her CBG schedule to start at 4am for fasting, called NSGY, left message with my information 17. Rash on chest: hydrocortisone cream ordered, continue 18. Tachycardia: discuss adding magnesium gluconate '250mg'$  HS          LOS: 10 days A FACE TO FACE EVALUATION WAS PERFORMED  Jennye Boroughs 11/27/2021, 3:42 PM

## 2021-11-27 NOTE — Progress Notes (Signed)
Occupational Therapy Weekly Progress Note  Patient Details  Name: Sheryl Mcmillan MRN: [id number] Date of Birth: 1976-02-01  Beginning of progress report period: October 19, 2021 End of progress report period: Oct 27, 2021   Patient has met 3 of 4 short term goals.  Pt is making steady progress towards LTGs. Pt has progressed functional transfers from dependent in stedy or min A lateral scoot to mod A stand pivot and supervision lateral scoot, is able to bathe at an overall supervision level, dress at an overall mod A level and requires mod assist for toileting tasks. Pt continues to be demonstrate RLE weakness and buckling, slight impulsivity, and endurance deficits, which impact pt's independence and readiness for discharge at this time. Pt continues to be highly motivated, putting forth max effort, and would benefit from continued OT services to maximize safety and independence with self-care and functional transfers at the supervision level. Family also would benefit from family education.   Patient continues to demonstrate the following deficits: muscle weakness, decreased cardiorespiratoy endurance, unbalanced muscle activation and decreased coordination, and decreased standing balance and hemiplegia and therefore will continue to benefit from skilled OT intervention to enhance overall performance with BADL, iADL, and Reduce care partner burden.  Patient progressing toward long term goals..  Continue plan of care.  OT Short Term Goals Week 1:  OT Short Term Goal 1 (Week 1): Pt will consistently transfer to toilet wiht MIN A and LRAD OT Short Term Goal 1 - Progress (Week 1): Progressing toward goal OT Short Term Goal 2 (Week 1): Pt will bathe 8/10 body parts with AE PRN OT Short Term Goal 2 - Progress (Week 1): Met OT Short Term Goal 3 (Week 1): Pt will recall hemi dressing techiques with no cuing OT Short Term Goal 3 - Progress (Week 1): Met OT Short Term Goal 4 (Week 1): Pt will complete  1/3 components of toileting OT Short Term Goal 4 - Progress (Week 1): Met Week 2:  OT Short Term Goal 1 (Week 2): Pt will consistently transfer to toilet wiht MIN A and LRAD OT Short Term Goal 2 (Week 2): Pt will complete sit > stand in prep for ADL task with min A using LRAD OT Short Term Goal 3 (Week 2): Pt will donn LB clothing with AE PRN, with min A    Chidiebere Wynn E Qunicy Higinbotham 11/27/2021, 7:37 AM

## 2021-11-27 NOTE — Evaluation (Signed)
Recreational Therapy Assessment and Plan  Patient Details  Name: Sheryl Mcmillan MRN: [id number] Date of Birth: April 09, 1976 Today's Date: 11/27/2021  Rehab Potential:  Good ELOS:   d/ 6/16  Assessment  Hospital Problem: Principal Problem:   Brain tumor [facility])     Past Medical History:      Past Medical History:  Diagnosis Date   Arthritis     Breast cancer ([place])     Family history of non-Hodgkin's lymphoma 06/01/2020   GERD (gastroesophageal reflux disease)     Hemorrhoids     Migraines     PCOS (polycystic ovarian syndrome)     PONV (postoperative nausea and vomiting)      Past Surgical History:       Past Surgical History:  Procedure Laterality Date   APPLICATION OF CRANIAL NAVIGATION Left 11/10/2021    Procedure: APPLICATION OF CRANIAL NAVIGATION;  Surgeon: Consuella Lose, MD;  Location: [place];  Service: Neurosurgery;  Laterality: Left;   AXILLARY SENTINEL NODE BIOPSY Left 11/07/2020    Procedure: LEFT AXILLARY SENTINEL NODE BIOPSY;  Surgeon: [name], MD;  Location: [place];  Service: General;  Laterality: Left;   BREAST CYST EXCISION Right 09/22/2021    Procedure: Excision of right axillary excess soft tissue.;  Surgeon: [name], MD;  Location: [place];  Service: Plastics;  Laterality: Right;   BREAST RECONSTRUCTION WITH PLACEMENT OF TISSUE EXPANDER AND FLEX HD (ACELLULAR HYDRATED DERMIS) Bilateral 11/07/2020    Procedure: BILATERAL BREAST RECONSTRUCTION WITH PLACEMENT OF TISSUE EXPANDER AND FLEX HD (ACELLULAR HYDRATED DERMIS);  Surgeon: [name], MD;  Location: [place];  Service: Plastics;  Laterality: Bilateral;   COSMETIC SURGERY       CRANIOTOMY Left 11/10/2021    Procedure: LT FRONTAL CRANIOTOMY TUMOR EXCISION;  Surgeon: Consuella Lose, MD;  Location: [place];  Service: Neurosurgery;  Laterality: Left;   DILATION AND CURETTAGE OF UTERUS       HEMORRHOID SURGERY       IR IMAGING GUIDED PORT INSERTION   06/15/2020   LIPOSUCTION  WITH LIPOFILLING Bilateral 09/22/2021    Procedure: Fat grafting bilateral breast;  Surgeon: [name], MD;  Location: [place];  Service: Plastics;  Laterality: Bilateral;   MASS EXCISION Bilateral 04/25/2021    Procedure: excision of bilateral axillary breast tissue;  Surgeon: [name], MD;  Location: [employer];  Service: Plastics;  Laterality: Bilateral;   PORT-A-CATH REMOVAL Right 11/07/2020    Procedure: REMOVAL PORT-A-CATH;  Surgeon: [name], MD;  Location: [place];  Service: General;  Laterality: Right;   REMOVAL OF BILATERAL TISSUE EXPANDERS WITH PLACEMENT OF BILATERAL BREAST IMPLANTS Bilateral 04/25/2021    Procedure: REMOVAL OF BILATERAL TISSUE EXPANDERS WITH PLACEMENT OF BILATERAL BREAST IMPLANTS;  Surgeon: [name], MD;  Location: [place];  Service: Plastics;  Laterality: Bilateral;   ROBOTIC ASSISTED TOTAL HYSTERECTOMY WITH BILATERAL SALPINGO OOPHERECTOMY Bilateral 03/14/2021    Procedure: XI ROBOTIC ASSISTED TOTAL HYSTERECTOMY WITH BILATERAL SALPINGO OOPHORECTOMY;  Surgeon: Everitt Amber, MD;  Location: [facility];  Service: Gynecology;  Laterality: Bilateral;   TOTAL MASTECTOMY Bilateral 11/07/2020    Procedure: BILATERAL MASTECTOMY;  Surgeon: [name], MD;  Location: [place];  Service: General;  Laterality: Bilateral;      Assessment & Plan Clinical Impression:  Pt is a 46 y.o. female who presented to the ED 5/16 with R side weakness.  MRI revealed partially solid contrast-enhancing mass in the superior left  hemisphere with large area of vasogenic edema, most consistent with  metastatic disease. Now s/p L frontal crani tumor excision 5/19. PMH: left breast cancer (ER negative, PR positive, BRCA1 positive) s/p neoadjuvant chemotherapy, bilateral mastectomy, hysterectomy and bilateral SOO .     Pt presents with decreased activity tolerance, decreased functional mobility, decreased balance, feelings of stress  Limiting pt's independence with leisure/community pursuits.  Met with pt today to initiate discussion about TR services.  Briefly introduced role of TR including activity analysis/modifications and discussing the importance of social, emotional, spiritual health in addition to physical health and their effects on overall health and wellness.  Pt stated understanding.  Plan  Min 1 TR session per week >20 minutes during LOS  Recommendations for other services: None   Discharge Criteria: Patient will be discharged from TR if patient refuses treatment 3 consecutive times without medical reason.  If treatment goals not met, if there is a change in medical status, if patient makes no progress towards goals or if patient is discharged from hospital.  The above assessment, treatment plan, treatment alternatives and goals were discussed and mutually agreed upon: by patient  [place] 11/27/2021, 3:25 PM

## 2021-11-28 ENCOUNTER — Encounter: Payer: Self-pay | Admitting: Licensed Clinical Social Worker

## 2021-11-28 LAB — GLUCOSE, CAPILLARY
Glucose-Capillary: 182 mg/dL — ABNORMAL HIGH (ref 70–99)
Glucose-Capillary: 175 mg/dL — ABNORMAL HIGH (ref 70–99)
Glucose-Capillary: 121 mg/dL — ABNORMAL HIGH (ref 70–99)
Glucose-Capillary: 181 mg/dL — ABNORMAL HIGH (ref 70–99)

## 2021-11-28 MED ORDER — MELATONIN 5 MG PO TABS: 5.0000 mg | ORAL_TABLET | Freq: Every evening | ORAL | 0 refills | Status: DC | PRN

## 2021-11-28 MED ORDER — INSULIN ASPART 100 UNIT/ML IJ SOLN
0.0000 [IU] | Freq: Three times a day (TID) | INTRAMUSCULAR | Status: DC
  Administered 2021-11-28: 1 [IU] via SUBCUTANEOUS
  Administered 2021-11-28 – 2021-11-29 (×2): 2 [IU] via SUBCUTANEOUS
  Administered 2021-11-29: 1 [IU] via SUBCUTANEOUS

## 2021-11-28 MED ORDER — HYDROCODONE-ACETAMINOPHEN 5-325 MG PO TABS: 1.0000 | ORAL_TABLET | Freq: Four times a day (QID) | ORAL | Status: DC | PRN

## 2021-11-28 MED ORDER — ACETAMINOPHEN 325 MG PO TABS: 325.0000 mg | ORAL_TABLET | ORAL | Status: DC | PRN

## 2021-11-28 MED ORDER — DEXAMETHASONE 2 MG PO TABS
2.0000 mg | ORAL_TABLET | Freq: Three times a day (TID) | ORAL | Status: AC
  Administered 2021-11-28 – 2021-12-03 (×17): 2 mg via ORAL
  Filled 2021-11-28 (×17): qty 1

## 2021-11-28 NOTE — Progress Notes (Signed)
Physical Therapy Session Note  Patient Details  Name: Sheryl Mcmillan MRN: [id number] Date of Birth: 03-Jul-1975  Today's Date: 11/28/2021 PT Individual Time: 0731-0826 PT Individual Time Calculation (min): 55 min   Short Term Goals: Week 1:  PT Short Term Goal 1 (Week 1): Pt will transfer bed<>chair with CGA PT Short Term Goal 1 - Progress (Week 1): Met PT Short Term Goal 2 (Week 1): Pt will complete sit to stand from chair height with MinA + RW PT Short Term Goal 2 - Progress (Week 1): Progressing toward goal PT Short Term Goal 3 (Week 1): Pt will complete amb of 10 ft with ModA + RW PT Short Term Goal 3 - Progress (Week 1): Progressing toward goal Week 2:  PT Short Term Goal 1 (Week 2): pt will transfer sit<>stand with LRAD and min A PT Short Term Goal 2 (Week 2): pt will transfer stand<>pivot with LRAD and mod A PT Short Term Goal 3 (Week 2): Pt will initate gait training with RW  Skilled Therapeutic Interventions/Progress Updates:   Received pt sitting in WC with R GRAFO donned and NT assisting. Pt agreeable to PT treatment and denied any pain during session. Session with emphasis on functional mobility/transfers, generalized strengthening and endurance, and gait training. Pt sat in WC at sink and brushed teeth/washed face mod I. Pt performed WC mobility 14f x 2 trials using R hemi technique and mod I to/from dayroom. Donned LiteGait harness seated, then stood with mod A and BUE support, guarding R knee, to continue donning harness. Pt stepped on/off LiteGait treadmill with mod A +2 for safety and cues for "up with the good, down with the bad" technique. Worked on gait training for the following time frames: -Trial 1: 15 minutes for 1,0011fforwards walking with BUE support starting at 0.45m70mincreasing to 0.7mp67m->0.8mph36m> 0.9mph 43mh cues to "keep R foot silent" and visual cues to keep one foot on either side of white line to widen BOS. Pt with increased R toe catching and bucking of  RLE with fatigue and when distracted.   -Trial 2: 10 minutes for 716ft f87frd walking with BUE support at 0.9mph. W53m trying to let go with 1 hand, R knee immediately buckled.  Pt required rest breaks in between each ambulation trials due to diaphoresis. Concluded session with [facility].   Therapy Documentation Precautions:  Precautions Precautions: Fall Restrictions Weight Bearing Restrictions: No  Therapy/Group: Individual Therapy Forrest Demuro M JAlfonse Alpers  11/28/2021, 6:51 AM

## 2021-11-28 NOTE — Progress Notes (Signed)
Occupational Therapy Session Note  Patient Details  Name: Sheryl Mcmillan MRN: [id number] Date of Birth: 03-06-1976  Today's Date: 11/28/2021 OT Individual Time: 1105-1200 & 6213-0865 OT Individual Time Calculation (min): 55 min & 70 min   Short Term Goals: Week 2:  OT Short Term Goal 1 (Week 2): Pt will consistently transfer to toilet wiht MIN A and LRAD OT Short Term Goal 2 (Week 2): Pt will complete sit > stand in prep for ADL task with min A using LRAD OT Short Term Goal 3 (Week 2): Pt will donn LB clothing with AE PRN, with min A  Skilled Therapeutic Interventions/Progress Updates:  Session 1 Skilled OT intervention completed with focus on tub/shower transfer and home management education. Pt received seated in w/c, only c/o fatigue and no pain, agreeable to session. Self-propelled in w/c with hemi-technique <> ADL apartment. Pt expressing concerns about getting in/out of her house, with therapist educating pt on CLOF, plans at d/c for potential ramp 2/2 no hand rails, with pt having further questions about how to get it coordinated with therapist communicating to primary PT and CSW for planning purposes. In ADL bathroom, pt drew layout of her bathroom with time spent going through strategies on how to get her into the bathroom, however ultimately discovering pt's w/c will not fit and in order to get into tub with have to walk to it and unsafe to complete at this time without lite gait or harness from [facility] however potentially attempting stand pivot at later date. To prep for shower transfer in hospital room, pt completed lateral scoot > L onto tub bench with supervision, then scoot > R with min A back to w/c. Pt needing min A for RLE over tub threshold and also safety cues for technique. Discussed shower curtain management and shower safety. Pt was left seated in w/c with chair alarm on and all needs in reach at end of session.  Session 2 Skilled OT intervention completed with focus on  functional endurance, transfers, RUE proximal shoulder strengthening. Pt received supine in bed, c/o fatigue but no pain. Completed bed mobility with supervision, then lateral scoot > R with CGA to w/c. Attempted lateral scooting to tub bench in bathroom however not accessible for lateral scoot 2/2 w/c and body habitus. Deferred to stedy transfer 2/2 fatigue with RLE vs stand pivot, with CGA for pulling up on stedy then supervision for sitting balance. Upon dependent transfer, pt with incontinent void episode with spillage all over floor. Pt relaying that she hasn't been to the bathroom since early this AM with discussion about paying attention to time of toileting and going every 3-4 hours to prevent incontinent episodes when bladder is full. Seated on tub bench, pt completed bathing with supervision using LH sponge. Sit > stand in stedy with CGA, then dependent transfer to EOB. Completed UB dressing with set up A, then LB dressing with supervision with pt utilizing leaning back method to donn pants over hips. Total A needed for socks/R GRAFO for time. Lateral scoot > L with CGA to w/c. Completed grooming tasks seated at sink.   Transported dependently in w/c <> outside. To promote proximal strength of R shoulder needed for functional tasks, pt completed the following with red theraband x10 each exercise: -Self-anchored horizontal shoulder abduction -Self-anchored bicep flexion -Attempted overhead press with bent arm however with min ROM therefore discontinued with theraband and completed AROM only -Therapist anchored scapular retraction Pt required cues for form and technique throughout. Pt was  left seated in w/c with chair alarm on and all needs within reach at end of session.   Therapy Documentation Precautions:  Precautions Precautions: Fall Restrictions Weight Bearing Restrictions: No   Therapy/Group: Individual Therapy  Blase Mess, MS, OTR/L  11/28/2021, 7:33 AM

## 2021-11-28 NOTE — Progress Notes (Signed)
Orthopedic Tech Progress Note Patient Details:  PAITEN BOIES 1975-12-27 [id number]  Called in order to [employer] for an AFO    Patient ID: [name], female   DOB: Nov 10, 1975, 46 y.o.   MRN: [id number]  Janit Pagan 11/28/2021, 4:08 PM

## 2021-11-28 NOTE — Progress Notes (Signed)
Sheryl Mcmillan CSW Progress Note  Clinical Education officer, museum received TC from patient with update on recovery process and discussed resources. Pt/ daughter are working on [id number] application- CSW helped provide path report for application. Pt also agreed to have CSW send application to [place] which was completed today.  CSW sent message to [facility] to determine if they can reach out to patient now that she can talk on the phone again.    Sheryl Hangartner E Terricka Onofrio, LCSW    Patient is participating in a Managed Medicaid Plan:  Yes

## 2021-11-28 NOTE — Discharge Instructions (Addendum)
Inpatient Rehab Discharge Instructions  Sheryl Mcmillan Discharge date and time:    Activities/Precautions/ Functional Status: Activity: no lifting, driving, or strenuous exercise for till cleared by MD Diet: diabetic diet Wound Care: keep wound clean and dry. Contact Dr. Kathyrn Sheriff if you develop any problems with your incision/wound--redness, swelling, increase in pain, drainage or if you develop fever or chills.     Functional status:  ___ No restrictions     ___ Walk up steps independently _X__ 24/7 supervision/assistance   ___ Walk up steps with assistance ___ Intermittent supervision/assistance  ___ Bathe/dress independently ___ Walk with walker     ___ Bathe/dress with assistance ___ Walk Independently    ___ Shower independently ___ Walk with assistance    ___ Shower with assistance _X__ No alcohol     ___ Return to work/school ________  COMMUNITY REFERRALS UPON DISCHARGE:     Outpatient: PT     OT                Agency: [facility] Phone: [phone]              Appointment Date/Time: TBD  Medical Equipment/Items Ordered: Wheelchair, Drop Arm Bedside Commode, [employer]                                                 Agency/Supplier: [employer] [phone]   Special Instructions: No driving smoking or alcohol    My questions have been answered and I understand these instructions. I will adhere to these goals and the provided educational materials after my discharge from the hospital.  Patient/Caregiver Signature _______________________________ Date __________  Clinician Signature _______________________________________ Date __________  Please bring this form and your medication list with you to all your follow-up doctor's appointments.

## 2021-11-28 NOTE — Progress Notes (Signed)
PROGRESS NOTE   Subjective/Complaints: CBGs changed to start at 4am and CBGs have much improved as a result. Decreased steroids to '2mg'$  q8H and will ask NSGY for weaning schedule.  Will let NSGY know changed from Lovenox to Xarelto due to abdominal bruising  ROS: Denies fever, chills, SOB, CP, vision changes  Objective:   No results found. No results for input(s): WBC, HGB, HCT, PLT in the last 72 hours.   No results for input(s): NA, K, CL, CO2, GLUCOSE, BUN, CREATININE, CALCIUM in the last 72 hours.    Intake/Output Summary (Last 24 hours) at 11/28/2021 1036 Last data filed at 11/28/2021 1006 Gross per 24 hour  Intake 360 ml  Output 800 ml  Net -440 ml        Physical Exam: Vital Signs Blood pressure 111/84, pulse 71, temperature 97.6 F (36.4 C), temperature source Oral, resp. rate 17, height '5\' 11"'$  (1.803 m), weight 131 kg, last menstrual period 09/29/2020, SpO2 99 %. Constitutional: No distress . Vital signs reviewed. BMI 40.28 HEENT: NCAT, EOMI, oral membranes moist Neck: supple Cardiovascular: RRR Respiratory/Chest: CTA Bilaterally without wheezes or rales. Normal effort    GI/Abdomen: BS +, non-tender, non-distended Ext: no clubbing, cyanosis, or edema Psych: pleasant and cooperative  Skin: incision CDI on scalp, rash on chest has improved Musculoskeletal:        General: No swelling.     Comments: Right heel cord is tight R knee buckling Can sit to stand in [place] Neurological:     Mental Status: She is alert.     Motor: Weakness present.     Comments: Alert and oriented x 3. Normal insight and awareness. Intact Memory. Normal language and speech. Cranial nerve exam unremarkable for mild right central 7 still present. RUE 0/5 deltoids, 2+ to 3/5 biceps, triceps  and 4-5/5 wrist and fingers. RLE: 1/5 HF/HAB and 0/5 elsewhere. DTR's 1+. Sensory exam normal for light touch and pain in all 4 limbs. No limb ataxia  or cerebellar signs. No abnormal tone appreciated.       Assessment/Plan: 1. Functional deficits which require 3+ hours per day of interdisciplinary therapy in a comprehensive inpatient rehab setting. Physiatrist is providing close team supervision and 24 hour management of active medical problems listed below. Physiatrist and rehab team continue to assess barriers to discharge/monitor patient progress toward functional and medical goals  Care Tool:  Bathing    Body parts bathed by patient: Right arm, Chest, Left arm, Abdomen, Front perineal area, Right upper leg, Left upper leg, Right lower leg, Left lower leg, Face, Buttocks   Body parts bathed by helper: Buttocks     Bathing assist Assist Level: Set up assist     Upper Body Dressing/Undressing Upper body dressing   What is the patient wearing?: Dress    Upper body assist Assist Level: Supervision/Verbal cueing (in stedy)    Lower Body Dressing/Undressing Lower body dressing      What is the patient wearing?: Pants     Lower body assist Assist for lower body dressing: Minimal Assistance - Patient > 75% (bed level)     Toileting Toileting    Toileting assist Assist for toileting:  Dependent - Patient 0% (stedy)     Transfers Chair/bed transfer  Transfers assist     Chair/bed transfer assist level: Supervision/Verbal cueing (lateral scoot to L)     Locomotion Ambulation   Ambulation assist   Ambulation activity did not occur: Safety/medical concerns (0/5 R LE MMT)  Assist level: Dependent - Patient 0% Assistive device: Lite Gait Max distance: >1,041f   Walk 10 feet activity   Assist  Walk 10 feet activity did not occur: Safety/medical concerns (0/5 R LE MMT)  Assist level: Dependent - Patient 0% Assistive device: Lite Gait   Walk 50 feet activity   Assist Walk 50 feet with 2 turns activity did not occur: Safety/medical concerns (0/5 R LE MMT)  Assist level: Dependent - Patient  0% Assistive device: Lite Gait    Walk 150 feet activity   Assist Walk 150 feet activity did not occur: Safety/medical concerns (0/5 R LE MMT)  Assist level: Dependent - Patient 0% Assistive device: Lite Gait    Walk 10 feet on uneven surface  activity   Assist Walk 10 feet on uneven surfaces activity did not occur: Safety/medical concerns (0/5 R LE MMT)         Wheelchair     Assist Is the patient using a wheelchair?: Yes Type of Wheelchair: Manual Wheelchair activity did not occur: Safety/medical concerns (Per PT)  Wheelchair assist level: Independent Max wheelchair distance: 1261f   Wheelchair 50 feet with 2 turns activity    Assist        Assist Level: Independent   Wheelchair 150 feet activity     Assist      Assist Level: Supervision/Verbal cueing   Blood pressure 111/84, pulse 71, temperature 97.6 F (36.4 C), temperature source Oral, resp. rate 17, height '5\' 11"'$  (1.803 m), weight 131 kg, last menstrual period 09/29/2020, SpO2 99 %.    Medical Problem List and Plan: 1. Functional deficits secondary to left posterior frontal lobe metastatic breast cancer, s/p resection with right-sided weakness             -patient may shower             -ELOS/Goals: 14-20 days, min assist goals with PT/OT and mod I with SLP             -keep RUE supported given proximal muscle weakness.              -PRAFO to help support/stretch right heel cord. Consider night splint  Continue CIR therapies including PT, OT, SLP  Provided with podcast recommendations to learn more about role of diet in cancer prevention/treatment  [facility] on [facility]  -1503fC mobility with R hemi technique   Provided with Eat to Beat Disease book  2.  S/p craniotomy with impaired mobility -DVT/anticoagulation:  Mechanical:  Antiembolism stockings, knee (TED hose) Bilateral lower  extremities Sequential compression devices, below knee Bilateral lower extremities -switch to Xarelto due to abdominal bruising from Loevnox injections. Ask NSGY for how long they would like anticoagulation, called NSGY, left message with my information, will call them again to inform/ask above.  3. Postoperative pain: Tylenol, Decrease Norco to 1 tab q6H as needed. Provided with pain relief journal.  4. Mood: LCSW to evaluate and provide emotional support             -antipsychotic agents: n/a 5. Neuropsych: This patient is capable of making  decisions on her own behalf. 6. Craniotomy incision site: staples removed  without complication. Site inspected and hearing well. 7. 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 Will see if can use BSC since will have been seen by therapy at that point.   5/29: 2 large BM's 5/27- con't regimen- use sorbitol prn  6/5 BM yesterday 11: Sensitivity to [employer] laundry products; is not allergic to baking powder 12: Intermittent elevated BP: continue to monitor --continue clonidine 0.1 mg BID. Consider TID dosing more consistent control 13: Morbid obesity: BMI 43.97; dietary counseling  14. Insomnia: continue melatonin  15. Leukocytosis  5/27- WBC 15.9- but since last labs, have new Decadron 4 mg q6 hours- afebrile- no signs/Sx's of illness- will recheck Monday and monitor  5/29 WBC is 16.5- discussed with patient that this has been stable.   5/31: discuss with  NSGY reducing steroids. Left message for NSGY with contact info to discuss 16. Hyperglycemia  5/28- started sensitive SSI and CBGs since had BG of 207 on BMP yesterday.  5/29- discussed how decadron will affects cbg's. Hopefully with taper we'll see improvement. Sugars still high this morning  5/30: start metformin '500mg'$  daily.   5/31: increase metformin to '500mg'$  BID.   6/1: increase metformin to '850mg'$   6/3 please check with NSGY on Monday if it would be ok to taper steroids to '2mg'$  q8H.   6/5 adjusted her CBG schedule to start at 4am for fasting, called NSGY, left message with my information  6/6: decrease steroids to '2mg'$  q8H and will ask NSGY today weaning schedule for steroids 17. Rash on chest: hydrocortisone cream ordered, continue 18. Tachycardia: discuss adding magnesium gluconate '250mg'$  HS          LOS: 11 days A FACE TO FACE EVALUATION WAS PERFORMED  Martha Clan P Donnis Phaneuf 11/28/2021, 10:36 AM

## 2021-11-29 LAB — GLUCOSE, CAPILLARY
Glucose-Capillary: 156 mg/dL — ABNORMAL HIGH (ref 70–99)
Glucose-Capillary: 139 mg/dL — ABNORMAL HIGH (ref 70–99)
Glucose-Capillary: 185 mg/dL — ABNORMAL HIGH (ref 70–99)
Glucose-Capillary: 207 mg/dL — ABNORMAL HIGH (ref 70–99)

## 2021-11-29 MED ORDER — INSULIN ASPART 100 UNIT/ML IJ SOLN: 0.0000 [IU] | Freq: Three times a day (TID) | INTRAMUSCULAR | Status: DC

## 2021-11-29 MED ORDER — HYDROCODONE-ACETAMINOPHEN 5-325 MG PO TABS: 1.0000 | ORAL_TABLET | Freq: Three times a day (TID) | ORAL | Status: DC | PRN

## 2021-11-29 MED ORDER — INSULIN ASPART 100 UNIT/ML IJ SOLN
0.0000 [IU] | Freq: Three times a day (TID) | INTRAMUSCULAR | Status: DC
  Administered 2021-11-29 – 2021-12-02 (×5): 1 [IU] via SUBCUTANEOUS

## 2021-11-29 NOTE — Progress Notes (Addendum)
Patient ID: Sheryl Mcmillan, female   DOB: [birth year], 46 y.o.   MRN: [id number] Team Conference Report to Patient/Family  Team Conference discussion was reviewed with the patient and caregiver, including goals, any changes in plan of care and target discharge date.  Patient and caregiver express understanding and are in agreement.  The patient has a target discharge date of 12/08/21.  Sw met with patient and daughter in patients room. Sw provided patient and daughter with team conference updates. Sw informed that patient received ramp information but her family can assist with building a ramp at home.  Sw informed patient of the current DME recommended. Patient preference of OP as [facility]. Family education scheduled on 6/15 1-4 PM. No additional questions or concerns. Dyanne Iha 11/29/2021, 2:04 PM

## 2021-11-29 NOTE — Progress Notes (Signed)
Occupational Therapy Session Note  Patient Details  Name: Sheryl Mcmillan MRN: [id number] Date of Birth: 02-08-1976  Today's Date: 11/29/2021 OT Individual Time: 7035-0093 OT Individual Time Calculation (min): 56 min    Short Term Goals: Week 2:  OT Short Term Goal 1 (Week 2): Pt will consistently transfer to toilet wiht MIN A and LRAD OT Short Term Goal 2 (Week 2): Pt will complete sit > stand in prep for ADL task with min A using LRAD OT Short Term Goal 3 (Week 2): Pt will donn LB clothing with AE PRN, with min A  Skilled Therapeutic Interventions/Progress Updates:  Skilled OT intervention completed with focus on self-care, kitchen management/meal prep, RUE functional use. Pt received seated EOB, donning her socks, shoes and R GRAFO with set up A. No physical assist needed. Lateral scoot > L to w/c with supervision. Completed grooming tasks seated at sink with mod I. Transported pt dependently in w/c > ADL apartment 2/2 pt holding meal prep items in lap. Pt was able to use L hand to pour half gallon of milk into cup, with R hand stabilizing, then remove all meal prep items to place on counter and place cold items into refrigerator. Education provided about how to complete meal prep tasks such as gathering items in her kitchen, with suggestion of using w/c to place items in lap vs using RW to walk and hold items, 2/2 R knee buckling and for maximizing safety. Good safety demonstrated with tasks. Self-propelled in w/c from ADL room > gym > pt's room with supervision using hemi-technique.   Seated in w/c, focus of rest of session on functional use of RUE, including AROM shoulder flexion, releasing of R hand grasp (pt with automatic holding during tasks at times), and gross/fine motor coordination and bilateral integration. Seated at basketball, pt utilized R hand only to shoot basketball to goal, with pt making about 5/15 trials. Education provided about slowing the movement down, concentrating on form  (which relates to ambulatory goals as well), lifting at shoulder vs shrugging, as well as using L hand under elbow to assist if arm needs to be higher. Upgraded task to using small foam ball in R hand, with pt making 1/5 trials therefore transitioned to ball toss/catching with cues needed to use R hand as stabilizer as well as for bringing hands together with pt able to catch small ball 8/10 trials. Pt was left seated in w/c in room, with chair alarm on and all needs in reach at end of session.   Therapy Documentation Precautions:  Precautions Precautions: Fall Restrictions Weight Bearing Restrictions: No    Therapy/Group: Individual Therapy  Blase Mess, MS, OTR/L  11/29/2021, 7:26 AM

## 2021-11-29 NOTE — Progress Notes (Signed)
Occupational Therapy Session Note  Patient Details  Name: Sheryl Mcmillan MRN: [id number] Date of Birth: 06-03-1976  Today's Date: 11/29/2021 OT Individual Time: 1045-1300 OT Individual Time Calculation (min): 135 min    Short Term Goals: Week 2:  OT Short Term Goal 1 (Week 2): Pt will consistently transfer to toilet wiht MIN A and LRAD OT Short Term Goal 2 (Week 2): Pt will complete sit > stand in prep for ADL task with min A using LRAD OT Short Term Goal 3 (Week 2): Pt will donn LB clothing with AE PRN, with min A  Skilled Therapeutic Interventions/Progress Updates:     Pt received in bed with no pain  Therapeutic activity Pt received in w/c agreeable to baking cupcakes this date. Pt completes all steps of process for bimanual coordination use of RUE wht improved attention to RUE and R environment needing decreased cuing for knife management. Pt able to problem solve use of new mixer with min cuing and gather all ingredients to one place with better efficiency than last week. Pt diligently works from seated level in w/c with BUE to measure, stir, chop and even manages oven this date with close supervision to check to see if cupcakes are baked thoroughly. Pt ices cooled cupcakes and places onto platter.  Pt left at end of session in bed with exit alarm on, call light in reach and all needs met   Therapy Documentation Precautions:  Precautions Precautions: Fall Restrictions Weight Bearing Restrictions: No General:      Therapy/Group: Individual Therapy  Tonny Branch 11/29/2021, 6:49 AM

## 2021-11-29 NOTE — Progress Notes (Signed)
Patient ID: Sheryl Mcmillan, female   DOB: 01-26-1976, 46 y.o.   MRN: [id number]  Wheelchair, Drop Arm BSC, and TTB ordered through [employer].

## 2021-11-29 NOTE — Progress Notes (Signed)
PROGRESS NOTE   Subjective/Complaints: No new complaints this morning  Rash is stable Discussed steroid wean recommended by NSGY Grounds pass ordered so she can go outside with daughter today  ROS: Denies fever, chills, SOB, CP, vision changes  Objective:   No results found. No results for input(s): WBC, HGB, HCT, PLT in the last 72 hours.   No results for input(s): NA, K, CL, CO2, GLUCOSE, BUN, CREATININE, CALCIUM in the last 72 hours.    Intake/Output Summary (Last 24 hours) at 11/29/2021 1138 Last data filed at 11/29/2021 0719 Gross per 24 hour  Intake 600 ml  Output 400 ml  Net 200 ml        Physical Exam: Vital Signs Blood pressure 104/77, pulse 73, temperature 98.2 F (36.8 C), temperature source Oral, resp. rate 17, height '5\' 11"'$  (1.803 m), weight 131 kg, last menstrual period 09/29/2020, SpO2 99 %. Constitutional: No distress . Vital signs reviewed. BMI 40.28 HEENT: NCAT, EOMI, oral membranes moist Neck: supple Cardiovascular: RRR Respiratory/Chest: CTA Bilaterally without wheezes or rales. Normal effort    GI/Abdomen: BS +, non-tender, non-distended Ext: no clubbing, cyanosis, or edema Psych: pleasant and cooperative  Skin: incision CDI on scalp, rash on chest has improved Musculoskeletal:        General: No swelling.     Comments: Right heel cord is tight R knee buckling Can sit to stand in [place] Neurological:     Mental Status: She is alert.     Motor: Weakness present.     Comments: Alert and oriented x 3. Normal insight and awareness. Intact Memory. Normal language and speech. Cranial nerve exam unremarkable for mild right central 7 still present. RUE 0/5 deltoids, 2+ to 3/5 biceps, triceps  and 4-5/5 wrist and fingers. RLE: 1/5 HF/HAB and 0/5 elsewhere. DTR's 1+. Sensory exam normal for light touch and pain in all 4 limbs. No limb ataxia or cerebellar signs. No abnormal tone appreciated.     Functional mobility: self-propelling WC with hemi-technique    Assessment/Plan: 1. Functional deficits which require 3+ hours per day of interdisciplinary therapy in a comprehensive inpatient rehab setting. Physiatrist is providing close team supervision and 24 hour management of active medical problems listed below. Physiatrist and rehab team continue to assess barriers to discharge/monitor patient progress toward functional and medical goals  Care Tool:  Bathing    Body parts bathed by patient: Right arm, Chest, Left arm, Abdomen, Front perineal area, Right upper leg, Left upper leg, Right lower leg, Left lower leg, Face, Buttocks   Body parts bathed by helper: Buttocks     Bathing assist Assist Level: Set up assist     Upper Body Dressing/Undressing Upper body dressing   What is the patient wearing?: Pull over shirt    Upper body assist Assist Level: Set up assist    Lower Body Dressing/Undressing Lower body dressing      What is the patient wearing?: Pants     Lower body assist Assist for lower body dressing: Supervision/Verbal cueing (EOB)     Toileting Toileting    Toileting assist Assist for toileting: Dependent - Patient 0% (stedy)     Transfers Chair/bed transfer  Transfers assist     Chair/bed transfer assist level: Supervision/Verbal cueing (lateral scoot to L)     Locomotion Ambulation   Ambulation assist   Ambulation activity did not occur: Safety/medical concerns (0/5 R LE MMT)  Assist level: Dependent - Patient 0% Assistive device: Lite Gait Max distance: >1,074f   Walk 10 feet activity   Assist  Walk 10 feet activity did not occur: Safety/medical concerns (0/5 R LE MMT)  Assist level: Dependent - Patient 0% Assistive device: Lite Gait   Walk 50 feet activity   Assist Walk 50 feet with 2 turns activity did not occur: Safety/medical concerns (0/5 R LE MMT)  Assist level: Dependent - Patient 0% Assistive device: Lite Gait     Walk 150 feet activity   Assist Walk 150 feet activity did not occur: Safety/medical concerns (0/5 R LE MMT)  Assist level: Dependent - Patient 0% Assistive device: Lite Gait    Walk 10 feet on uneven surface  activity   Assist Walk 10 feet on uneven surfaces activity did not occur: Safety/medical concerns (0/5 R LE MMT)         Wheelchair     Assist Is the patient using a wheelchair?: Yes Type of Wheelchair: Manual Wheelchair activity did not occur: Safety/medical concerns (Per PT)  Wheelchair assist level: Independent Max wheelchair distance: 1282f   Wheelchair 50 feet with 2 turns activity    Assist        Assist Level: Independent   Wheelchair 150 feet activity     Assist      Assist Level: Supervision/Verbal cueing   Blood pressure 104/77, pulse 73, temperature 98.2 F (36.8 C), temperature source Oral, resp. rate 17, height '5\' 11"'$  (1.803 m), weight 131 kg, last menstrual period 09/29/2020, SpO2 99 %.    Medical Problem List and Plan: 1. Functional deficits secondary to left posterior frontal lobe metastatic breast cancer, s/p resection with right-sided weakness             -patient may shower             -ELOS/Goals: 14-20 days, min assist goals with PT/OT and mod I with SLP             -keep RUE supported given proximal muscle weakness.              -PRAFO to help support/stretch right heel cord. Consider night splint  Continue CIR therapies including PT, OT, SLP  Provided with podcast recommendations to learn more about role of diet in cancer prevention/treatment  [facility] on [facility]  -[id number] mobility with R hemi technique   Provided with Eat to Beat Disease book  Provided information regarding integrative oncologist  2.  S/p craniotomy with impaired mobility -DVT/anticoagulation:  Mechanical:  Antiembolism stockings, knee (TED hose)  Bilateral lower extremities Sequential compression devices, below knee Bilateral lower extremities Transitioned to Eliquis as per NSGY recommendation.  3. Postoperative pain: Tylenol, Decrease Norco to 1 tab q6H as needed. Provided with pain relief journal.  4. Mood: LCSW to evaluate and provide emotional support             -antipsychotic agents: n/a 5. Neuropsych: This patient is capable of making decisions on her own behalf. 6. Craniotomy incision site: staples removed  without complication. Site inspected and hearing well. 7. Fluids/Electrolytes/Nutrition: Routine Is and Os and follow-up chemistries 8: Recurrent metastatic breast cancer  with brain lesion s/p resection             --decrease Decadron 4 mg q 8 hours             --discontinue Keppra 500 mg q 12 since has been 2 weeks post-surgery seizure free 9: GERD: continue Protonix 10: Constipation: getting Miralax daily and Dulcolax supp PRN             --consider milk of magnesia/Fleets if needed             -last bm 5/24  5/27- pt reports last "decent BM" was 5/16- so will give Sorbitol 60cc after therapy. Will see if can use BSC since will have been seen by therapy at that point.   5/29: 2 large BM's 5/27- con't regimen- use sorbitol prn  6/5 BM yesterday 11: Sensitivity to [employer] laundry products; is not allergic to baking powder 12: Intermittent elevated BP: continue to monitor --discontinue clonidine 13: Morbid obesity: BMI 43.97; dietary counseling  14. Insomnia: continue melatonin  15. Leukocytosis  5/27- WBC 15.9- but since last labs, have new Decadron 4 mg q6 hours- afebrile- no signs/Sx's of illness- will recheck Monday and monitor  5/29 WBC is 16.5- discussed with patient that this has been stable.   6/7 steroid wean discussed with NSGY 16. Hyperglycemia  5/28- started sensitive SSI and CBGs since had BG of 207 on BMP yesterday.  5/29- discussed how decadron will affects cbg's. Hopefully with taper we'll see  improvement. Sugars still high this morning  5/30: start metformin '500mg'$  daily.   5/31: increase metformin to '500mg'$  BID.   6/1: increase metformin to '850mg'$   6/3 please check with NSGY on Monday if it would be ok to taper steroids to '2mg'$  q8H.   6/5 adjusted her CBG schedule to start at 4am for fasting, called NSGY, left message with my information  6/6: decrease steroids to '2mg'$  q8H and will ask NSGY today weaning schedule for steroids 6/7: wean steroids to BID on 6/10 17. Rash on chest: hydrocortisone cream ordered, continue 18. Tachycardia: resolved, continue to monitor HR TID          LOS: 12 days A FACE TO FACE EVALUATION WAS PERFORMED  Sheryl Mcmillan 11/29/2021, 11:38 AM

## 2021-11-29 NOTE — Progress Notes (Signed)
Physical Therapy Session Note  Patient Details  Name: Sheryl Mcmillan MRN: [id number] Date of Birth: [birth year]  Today's Date: 11/29/2021 PT Individual Time: 1300-1355 PT Individual Time Calculation (min): 55 min   Short Term Goals: Week 1:  PT Short Term Goal 1 (Week 1): Pt will transfer bed<>chair with CGA PT Short Term Goal 1 - Progress (Week 1): Met PT Short Term Goal 2 (Week 1): Pt will complete sit to stand from chair height with MinA + RW PT Short Term Goal 2 - Progress (Week 1): Progressing toward goal PT Short Term Goal 3 (Week 1): Pt will complete amb of 10 ft with ModA + RW PT Short Term Goal 3 - Progress (Week 1): Progressing toward goal Week 2:  PT Short Term Goal 1 (Week 2): pt will transfer sit<>stand with LRAD and min A PT Short Term Goal 2 (Week 2): pt will transfer stand<>pivot with LRAD and mod A PT Short Term Goal 3 (Week 2): Pt will initate gait training with RW  Skilled Therapeutic Interventions/Progress Updates:   Received pt sitting in WC with R GRAFO donned, ready for therapy. Pt agreeable to PT treatment and denied any pain during session - recreational therapy present during session. Session with emphasis on functional mobility/transfers, toileting, generalized strengthening and endurance, dynamic standing balance/coordination, and gait training. Pt reported urge to void and transferred sit<>stand in [place] with min A for time management and due to urgency. Transferred to toilet dependently and able to manage clothing with CGA and void/perform peri-care with supervision. Sit<>stand from regular height toilet with light mod A and transferred back into WC dependently via [place]. Provided pt with home measurement sheet and discussed getting AFO consult scheduled for tomorrow. Also discussed having WC upon discharge for safety and energy conservation purposes - pt will benefit from 20x16 manual WC - CSW notified. Pt performed WC mobility 146f x 2 trials using R hemi technique  and mod I to/from dayroom. Donned LiteGait harness seated with mod A and pt stood with mod A and completed donning harness standing with max A. Pt stepped on/off LiteGait Treadmill with +2 assist for safety and worked on gait training for the following time frames: Trial 1: forward walking for 10 minutes for 8270fwith BUE support at 36m62m Pt with a few instances of R knee buckling with fatigue and when distracted. Pt required cues to "keep R foot silent" Trial 2: forward walking for 15 minutes for 1,043 ft on incline 2 at 0.8mp26mith BUE support fading to 1 UE support (for ~4-5 minutes). When letting go with 1 UE, noted increased hyperextension and poor gait mechanics  During rest breaks discussed pt's goal of "walking out of here" and what that meant to her. Pt ultimately stated that she recognizes her current impairments and understands if she will require a WC and RW upon discharge. Pointed out that pt will receive OPPT and will continue progressing with gait training once she leaves here. Con[facility] needs within reach.   Therapy Documentation Precautions:  Precautions Precautions: Fall Restrictions Weight Bearing Restrictions: No  Therapy/Group: Individual Therapy AnnaAlfonse Alpers DPT  11/29/2021, 7:01 AM

## 2021-11-29 NOTE — Patient Care Conference (Signed)
Inpatient RehabilitationTeam Conference and Plan of Care Update Date: 11/29/2021   Time: 11:35 AM    Patient Name: Sheryl Mcmillan      Medical Record Number: [id number]  Date of Birth: 03/13/1976 Sex: Female         Room/Bed: 4W13C/4W13C-01 Payor Info: Payor: [place] / Plan: [place] MEDICAID [place] / Product Type: *No Product type* /    Admit Date/Time:  11/17/2021  4:04 PM  Primary Diagnosis:  Brain tumor [facility])  Hospital Problems: Principal Problem:   Brain tumor [facility])    Expected Discharge Date: Expected Discharge Date: 12/08/21  Team Members Present: Physician leading conference: Dr. Leeroy Cha Social Worker Present: Erlene Quan, BSW Nurse Present: Dorthula Nettles, RN PT Present: Becky Sax, PT OT Present: Jennefer Bravo, OT PPS Coordinator present : Ileana Ladd, PT     Current Status/Progress Goal Weekly Team Focus  Bowel/Bladder   continent b/b  remain continent  toilet as needed   Swallow/Nutrition/ Hydration             ADL's   Supervision bathing with LH sponge, set up A UB dressing, mod A LB dressing (set up A at bed level), Max A toileting, lateral scoot > L with CGA toilet transfers  supervision  safety awareness, d/c planning, endurance, RUE NMR   Mobility   supine<>sit supervision/mod I, lateral scoots to L supervision but to R min A, sit<>stands with/without RW min/mod A, gait 29f in LiteGait with mod A +2 for equipment management  supervision, CGA steps  functional mobility/transfers, generalized strengthening and endurance, dynamic standing balance/coordination, gait training   Communication             Safety/Cognition/ Behavioral Observations            Pain   no reports of pain  < 3  assess pain q 4hr and prn   Skin   skin intact  no new breakdown  assess skin q shift and prn     Discharge Planning:  Discharging home wit assistance from daughter and friends.   Team Discussion: Making great  progress medically. Decreasing steroids, wean every 4 days. Lovenox changed to Eliquis. Rash to chest stable. Grounds pass given. Continent B/b, no pain reported. Discharging home with daughter. Very motivated. Family education to be with daughter. Bariatric BSC would be optimal. Depending on weight limit, may have to pay privately. Will need tub transfer bench. AFO consult with [employer]. Right knee buckles, needs body weight support. Will need ramp installed. Will need WC. Right arm function improving.  Patient on target to meet rehab goals: yes, supervision overall. Currently mod I bed mobility, min/mod assist STS, supervision bathing with AE, mod assist lower body dressing. Max assist toileting, supervision/mod I bed level.  *See Care Plan and progress notes for long and short-term goals.   Revisions to Treatment Plan:  Adjusting medications   Teaching Needs: Family education, medication management, skin/wound care, transfer training, etc.   Current Barriers to Discharge: Decreased caregiver support, Home enviroment access/layout, Lack of/limited family support, Weight, and Weight bearing restrictions  Possible Resolutions to Barriers: Family education Follow-up therapy Order recommended DME Installation of ramp     Medical Summary Current Status: elevated CBGs, brain tumor, denies pain, decreased strength on right side, excellent motivation, prefers less medication  Barriers to Discharge: Medical stability  Barriers to Discharge Comments: Elevated CBGs, brain tumor, bruising from Lovenox injections, polypharmacy Possible Resolutions to [employer]el[employer] CorporationFocus: discussed with NSGY  weaning steroids to '2mg'$  q8H, AFO consult placed, changed from Lovenox to Eliquis, d/ced Keppra, discussed wean of insulin   Continued Need for Acute Rehabilitation Level of Care: The patient requires daily medical management by a physician with specialized training in physical medicine and rehabilitation for  the following reasons: Direction of a multidisciplinary physical rehabilitation program to maximize functional independence : Yes Medical management of patient stability for increased activity during participation in an intensive rehabilitation regime.: Yes Analysis of laboratory values and/or radiology reports with any subsequent need for medication adjustment and/or medical intervention. : Yes   I attest that I was present, lead the team conference, and concur with the assessment and plan of the team.   Cristi Loron 11/29/2021, 12:04 PM

## 2021-11-30 LAB — GLUCOSE, CAPILLARY
Glucose-Capillary: 143 mg/dL — ABNORMAL HIGH (ref 70–99)
Glucose-Capillary: 172 mg/dL — ABNORMAL HIGH (ref 70–99)
Glucose-Capillary: 155 mg/dL — ABNORMAL HIGH (ref 70–99)
Glucose-Capillary: 169 mg/dL — ABNORMAL HIGH (ref 70–99)

## 2021-11-30 MED ORDER — DEXAMETHASONE 2 MG PO TABS
1.0000 mg | ORAL_TABLET | Freq: Three times a day (TID) | ORAL | Status: AC
  Administered 2021-12-04 – 2021-12-07 (×12): 1 mg via ORAL
  Filled 2021-11-30 (×12): qty 1

## 2021-11-30 MED ORDER — DEXAMETHASONE 2 MG PO TABS: 1.0000 mg | ORAL_TABLET | Freq: Every day | ORAL | Status: DC

## 2021-11-30 MED ORDER — HYDROCORTISONE 1 % EX CREA: TOPICAL_CREAM | Freq: Three times a day (TID) | CUTANEOUS | Status: DC | PRN

## 2021-11-30 MED ORDER — DEXAMETHASONE 2 MG PO TABS
1.0000 mg | ORAL_TABLET | Freq: Two times a day (BID) | ORAL | Status: DC
Start: 2021-12-08 — End: 2021-12-08
  Administered 2021-12-08: 1 mg via ORAL
  Filled 2021-11-30: qty 1

## 2021-11-30 MED ORDER — METFORMIN HCL 500 MG PO TABS
500.0000 mg | ORAL_TABLET | Freq: Every day | ORAL | Status: DC
  Administered 2021-12-01: 500 mg via ORAL
  Filled 2021-11-30: qty 1

## 2021-11-30 NOTE — Progress Notes (Signed)
  Physical Therapy Session Note  Patient Details  Name: IVELISSE CULVERHOUSE MRN: [id number] Date of Birth: 1975/11/08  Today's Date: 11/30/2021 PT Individual Time: 4098-1191 PT Individual Time Calculation (min): 57 min   Short Term Goals: Week 1:  PT Short Term Goal 1 (Week 1): Pt will transfer bed<>chair with CGA PT Short Term Goal 1 - Progress (Week 1): Met PT Short Term Goal 2 (Week 1): Pt will complete sit to stand from chair height with MinA + RW PT Short Term Goal 2 - Progress (Week 1): Progressing toward goal PT Short Term Goal 3 (Week 1): Pt will complete amb of 10 ft with ModA + RW PT Short Term Goal 3 - Progress (Week 1): Progressing toward goal Week 2:  PT Short Term Goal 1 (Week 2): pt will transfer sit<>stand with LRAD and min A PT Short Term Goal 2 (Week 2): pt will transfer stand<>pivot with LRAD and mod A PT Short Term Goal 3 (Week 2): Pt will initate gait training with RW  Skilled Therapeutic Interventions/Progress Updates:   Received pt sitting in WC with R GRAFO donned. Pt agreeable to PT treatment and denied any pain during session. Chris from [place] present for AFO consult this session. Session with emphasis on functional mobility/transfers, generalized strengthening and endurance, dynamic standing balance, NMR, and gait training. Pt performed WC mobility 127f x 2 trials using R hemi technique mod I to/from dayroom. Sit<>stand with RW and min A and ambulated 1814fx 3 trials with RW and min A with close WC follow with 1 instance of R knee buckling requiring heavy mod A to correct. Of note, pt with R knee flexed in stance when ambulating with intermittent "bouncing". Pt required cues for attention/focus and control of R knee. Worked on blocked practice sit<>stands with RW and min A x10 reps from EOM with emphasis on technique and R knee control. Then worked on sit<>stands from elevated EOM with LLE on 3in step with BUE support and mod A fading to min A 2x8 reps with emphasis on R  lateral weight shifting and quad activation. Transitioned to standing squats 2x10 with BUE support and min A with cues for technique. Lastly, worked on sit<>stands with 1 UE support on mat 2x15 reps with CGA/min A for balance. Stand<>pivot mat<>WC with RW and min A. [facility] reach and daughter present at bedside.   Therapy Documentation Precautions:  Precautions Precautions: Fall Restrictions Weight Bearing Restrictions: No  Therapy/Group: Individual Therapy AnAlfonse AlpersT, DPT  11/30/2021, 7:01 AM

## 2021-11-30 NOTE — Progress Notes (Signed)
Occupational Therapy Session Note  Patient Details  Name: Sheryl Mcmillan MRN: [id number] Date of Birth: 06-18-1976  Today's Date: 11/30/2021 OT Individual Time: 4967-5916 OT Individual Time Calculation (min): 70 min    Short Term Goals: Week 2:  OT Short Term Goal 1 (Week 2): Pt will consistently transfer to toilet wiht MIN A and LRAD OT Short Term Goal 2 (Week 2): Pt will complete sit > stand in prep for ADL task with min A using LRAD OT Short Term Goal 3 (Week 2): Pt will donn LB clothing with AE PRN, with min A  Skilled Therapeutic Interventions/Progress Updates:    Patient received up in wheelchair cleaning her room.  Patient declined shower stating - 'I want to work!"  Patient prefers to go to gym to work on right shoulder.  Skilled OT intervention to address postural control, right hip mobility, neuromuscular reeducation, and functional mobility.  Patient with extremely tight right hip.  Worked on weight shifting body over stable right hip and forward weight shift / midline orientation for sit to stand and stand to sit transitions.  Continued emphasis on midline awareness and controlled flexion at hips and knees reducing use of UEs for this transition.  Neuro reed to address reach patterns in RUE - Reducing trunk compensations/ shoulder elevation with cue for "lead with your hand." Worked on low reach patterns, and tossing/catching ball bilaterally while standing to increase awareness and demand on RLE  as well as RUE.  Patient very receptive to verbal cueing and demonstration, and could also identify less than ideal strategies.  Patient worked consistently throughout prolonged session - very motivated for improved performance.    Therapy Documentation Precautions:  Precautions Precautions: Fall Restrictions Weight Bearing Restrictions: No   Pain: Pain Assessment Pain Scale: 0-10 Pain Score: 0-No pain   Therapy/Group: Individual Therapy  Mariah Milling 11/30/2021, 12:26 PM

## 2021-11-30 NOTE — Progress Notes (Signed)
Occupational Therapy Session Note  Patient Details  Name: Sheryl Mcmillan MRN: [id number] Date of Birth: [birth year]  Today's Date: 11/30/2021 OT Group Time: 1430-1530 OT Group Time Calculation (min): 60 min   Short Term Goals: Week 2:  OT Short Term Goal 1 (Week 2): Pt will consistently transfer to toilet wiht MIN A and LRAD OT Short Term Goal 2 (Week 2): Pt will complete sit > stand in prep for ADL task with min A using LRAD OT Short Term Goal 3 (Week 2): Pt will donn LB clothing with AE PRN, with min A  Skilled Therapeutic Interventions/Progress Updates:  Pt participated in group session with a focus on stress mgmt, education on healthy coping strategies, and social interaction. Focus of session on providing coping strategies to manage new diagnosis to allow for improved mental health to increase overall quality of life . Discussed how to break down stressors into "daily hassles," "major life stressors" and "life circumstances" in an effort to allow pts to chunk their stressors into groups and determine where to best put their efforts/time when dealing with stress. Pt actively sharing stressors and contributing to group conversation. Provided active listening, emotional support and therapeutic use of self. Offered education on factors that protect [place] against stress such as "daily uplifts," "healthy coping strategies" and "protective factors." Encouraged all group members to make an effort to actively recall one event from their day that was a daily uplift in an effort to protect their mindset from stressors as well as sharing this information with their caregivers to facilitate improved caregiver communication and decrease overall burden of care.  Issued pt handouts on healthy coping strategies to implement into routine. Pt completed w/c propulsion back to room with supervision.  Therapy Documentation Precautions:  Precautions Precautions: Fall Restrictions Weight Bearing Restrictions: No     Pain: no pain     Therapy/Group: Group Therapy  Precious Haws 11/30/2021, 4:05 PM

## 2021-11-30 NOTE — Progress Notes (Signed)
PROGRESS NOTE   Subjective/Complaints: She would like to wean off steroids and metformin. Discussed wean off steroids to wen 6/24 and placed pharm consult to put in this wean. Discussed wean off metformin- metformin/CBG checks/insulin can be stopped tomorrow  ROS: Denies fever, chills, SOB, CP, vision changes  Objective:   No results found. No results for input(s): "WBC", "HGB", "HCT", "PLT" in the last 72 hours.   No results for input(s): "NA", "K", "CL", "CO2", "GLUCOSE", "BUN", "CREATININE", "CALCIUM" in the last 72 hours.    Intake/Output Summary (Last 24 hours) at 11/30/2021 1228 Last data filed at 11/29/2021 1900 Gross per 24 hour  Intake 480 ml  Output --  Net 480 ml        Physical Exam: Vital Signs Blood pressure 130/84, pulse 82, temperature 97.7 F (36.5 C), temperature source Oral, resp. rate 18, height '5\' 11"'$  (1.803 m), weight 131 kg, last menstrual period 06/08/2020, SpO2 100 %. Constitutional: No distress . Vital signs reviewed. BMI 40.28 HEENT: NCAT, EOMI, oral membranes moist Neck: supple Cardiovascular: RRR Respiratory/Chest: CTA Bilaterally without wheezes or rales. Normal effort    GI/Abdomen: BS +, non-tender, non-distended Ext: no clubbing, cyanosis, or edema Psych: pleasant and cooperative  Skin: incision CDI on scalp, rash on chest has improved Musculoskeletal:        General: No swelling.     Comments: Right heel cord is tight R knee buckling Can sit to stand in [place] Neurological:     Mental Status: She is alert.     Motor: Weakness present.     Comments: Alert and oriented x 3. Normal insight and awareness. Intact Memory. Normal language and speech. Cranial nerve exam unremarkable for mild right central 7 still present. RUE 0/5 deltoids, 2+ to 3/5 biceps, triceps  and 4-5/5 wrist and fingers. RLE: 1/5 HF/HAB and 0/5 elsewhere. DTR's 1+. Sensory exam normal for light touch and pain in all 4  limbs. No limb ataxia or cerebellar signs. No abnormal tone appreciated.    Functional mobility: self-propelling WC with hemi-technique, can don socks and shoes set-up    Assessment/Plan: 1. Functional deficits which require 3+ hours per day of interdisciplinary therapy in a comprehensive inpatient rehab setting. Physiatrist is providing close team supervision and 24 hour management of active medical problems listed below. Physiatrist and rehab team continue to assess barriers to discharge/monitor patient progress toward functional and medical goals  Care Tool:  Bathing    Body parts bathed by patient: Right arm, Chest, Left arm, Abdomen, Front perineal area, Right upper leg, Left upper leg, Right lower leg, Left lower leg, Face, Buttocks   Body parts bathed by helper: Buttocks     Bathing assist Assist Level: Set up assist     Upper Body Dressing/Undressing Upper body dressing   What is the patient wearing?: Pull over shirt    Upper body assist Assist Level: Set up assist    Lower Body Dressing/Undressing Lower body dressing      What is the patient wearing?: Pants     Lower body assist Assist for lower body dressing: Supervision/Verbal cueing (EOB)     Toileting Toileting    Toileting assist Assist  for toileting: Dependent - Patient 0% (stedy)     Transfers Chair/bed transfer  Transfers assist     Chair/bed transfer assist level: Supervision/Verbal cueing (lateral scoot to L)     Locomotion Ambulation   Ambulation assist   Ambulation activity did not occur: Safety/medical concerns (0/5 R LE MMT)  Assist level: Dependent - Patient 0% Assistive device: Lite Gait Max distance: >1,091f   Walk 10 feet activity   Assist  Walk 10 feet activity did not occur: Safety/medical concerns (0/5 R LE MMT)  Assist level: Dependent - Patient 0% Assistive device: Lite Gait   Walk 50 feet activity   Assist Walk 50 feet with 2 turns activity did not occur:  Safety/medical concerns (0/5 R LE MMT)  Assist level: Dependent - Patient 0% Assistive device: Lite Gait    Walk 150 feet activity   Assist Walk 150 feet activity did not occur: Safety/medical concerns (0/5 R LE MMT)  Assist level: Dependent - Patient 0% Assistive device: Lite Gait    Walk 10 feet on uneven surface  activity   Assist Walk 10 feet on uneven surfaces activity did not occur: Safety/medical concerns (0/5 R LE MMT)         Wheelchair     Assist Is the patient using a wheelchair?: Yes Type of Wheelchair: Manual Wheelchair activity did not occur: Safety/medical concerns (Per PT)  Wheelchair assist level: Independent Max wheelchair distance: 1266f   Wheelchair 50 feet with 2 turns activity    Assist        Assist Level: Independent   Wheelchair 150 feet activity     Assist      Assist Level: Supervision/Verbal cueing   Blood pressure 130/84, pulse 82, temperature 97.7 F (36.5 C), temperature source Oral, resp. rate 18, height '5\' 11"'$  (1.803 m), weight 131 kg, last menstrual period 06/08/2020, SpO2 100 %.    Medical Problem List and Plan: 1. Functional deficits secondary to left posterior frontal lobe metastatic breast cancer, s/p resection with right-sided weakness             -patient may shower             -ELOS/Goals: 14-20 days, min assist goals with PT/OT and mod I with SLP             -keep RUE supported given proximal muscle weakness.              -PRAFO to help support/stretch right heel cord. Consider night splint  Continue CIR therapies including PT, OT, SLP  Provided with podcast recommendations to learn more about role of diet in cancer prevention/treatment  [facility] on [facility]  -[id number] mobility with R hemi technique   Provided with Eat to Beat Disease book  Provided information regarding integrative oncologist  2.  S/p  craniotomy with impaired mobility -DVT/anticoagulation:  Mechanical:  Antiembolism stockings, knee (TED hose) Bilateral lower extremities Sequential compression devices, below knee Bilateral lower extremities Transitioned to Eliquis as per NSGY recommendation. Discussed that she should continue this until follow-up with NSGY outpatient.  3. Postoperative pain: Tylenol, Decrease Norco to 1 tab q6H as needed. Provided with pain relief journal.  4. Mood: LCSW to evaluate and provide emotional support             -antipsychotic agents: n/a 5. Neuropsych: This patient is capable of making decisions on her own behalf. 6. Craniotomy  incision site: staples removed  without complication. Site inspected and hearing well. 7. 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 Will see if can use BSC since will have been seen by therapy at that point.   5/29: 2 large BM's 5/27- con't regimen- use sorbitol prn  6/5 BM yesterday 11: Sensitivity to [employer] laundry products; is not allergic to baking powder 12: Intermittent elevated BP: continue to monitor --discontinue clonidine 13: Morbid obesity: BMI 43.97; dietary counseling  14. Insomnia: continue melatonin  15. Leukocytosis  5/27- WBC 15.9- but since last labs, have new Decadron 4 mg q6 hours- afebrile- no signs/Sx's of illness- will recheck Monday and monitor  5/29 WBC is 16.5- discussed with patient that this has been stable.   6/7 steroid wean discussed  with NSGY 16. Hyperglycemia  5/28- started sensitive SSI and CBGs since had BG of 207 on BMP yesterday.  5/29- discussed how decadron will affects cbg's. Hopefully with taper we'll see improvement. Sugars still high this morning  5/30: start metformin '500mg'$  daily.   5/31: increase metformin to '500mg'$  BID.   6/1: increase metformin to '850mg'$   6/3 please check with NSGY on Monday if it would be ok to taper steroids to '2mg'$  q8H.   6/5 adjusted her CBG schedule to start at 4am for fasting, called NSGY, left message with my information  6/6: decrease steroids to '2mg'$  q8H and will ask NSGY today weaning schedule for steroids 6/7: wean steroids to BID on 6/10  6/8: requested pharmacy to put in following steroid wean and discussed with patient: 6/12: '1mg'$  TID, 6/16: '1mg'$  BID, 6/20: '1mg'$  daily for 4 days then off. Discussed plan to stop metformin/CBGs/insulin tomorrow.  17. Rash on chest: hydrocortisone cream ordered, change to prn 18. Tachycardia: resolved, continue to monitor HR TID          LOS: 13 days A FACE TO FACE EVALUATION WAS PERFORMED  Sheryl Mcmillan Sheryl Mcmillan 11/30/2021, 12:28 PM

## 2021-12-01 LAB — BASIC METABOLIC PANEL
Anion gap: 11 (ref 5–15)
BUN: 11 mg/dL (ref 6–20)
CO2: 21 mmol/L — ABNORMAL LOW (ref 22–32)
Calcium: 8.9 mg/dL (ref 8.9–10.3)
Chloride: 105 mmol/L (ref 98–111)
Creatinine, Ser: 0.76 mg/dL (ref 0.44–1.00)
GFR, Estimated: >60 mL/min (ref >60)
Glucose, Bld: 224 mg/dL — ABNORMAL HIGH (ref 70–99)
Potassium: 3.6 mmol/L (ref 3.5–5.1)
Sodium: 137 mmol/L (ref 135–145)

## 2021-12-01 LAB — CBC
HCT: 41.5 % (ref 36.0–46.0)
Hemoglobin: 13.5 g/dL (ref 12.0–15.0)
MCH: 27.7 pg (ref 26.0–34.0)
MCHC: 32.5 g/dL (ref 30.0–36.0)
MCV: 85.2 fL (ref 80.0–100.0)
Platelets: 214 K/uL (ref 150–400)
RBC: 4.87 MIL/uL (ref 3.87–5.11)
RDW: 15.2 % (ref 11.5–15.5)
WBC: 8.7 K/uL (ref 4.0–10.5)
nRBC: 0 % (ref 0.0–0.2)

## 2021-12-01 LAB — GLUCOSE, CAPILLARY
Glucose-Capillary: 172 mg/dL — ABNORMAL HIGH (ref 70–99)
Glucose-Capillary: 126 mg/dL — ABNORMAL HIGH (ref 70–99)
Glucose-Capillary: 246 mg/dL — ABNORMAL HIGH (ref 70–99)

## 2021-12-01 NOTE — Progress Notes (Signed)
PROGRESS NOTE   Subjective/Complaints: Dr. Ranell Patrick discussed stopping metformin with patient yesterday. Will discontinue this medication and continue to monitor CBG for now. No new concerns.   ROS: Denies fever, chills, SOB, CP, vision changes. No Headache  Objective:   No results found. Recent Labs    12/01/21 0621  WBC 8.7  HGB 13.5  HCT 41.5  PLT 214     Recent Labs    12/01/21 0621  NA 137  K 3.6  CL 105  CO2 21*  GLUCOSE 224*  BUN 11  CREATININE 0.76  CALCIUM 8.9     No intake or output data in the 24 hours ending 12/01/21 0849       Physical Exam: Vital Signs Blood pressure 125/63, pulse 64, temperature 98.3 F (36.8 C), temperature source Oral, resp. rate 18, height '5\' 11"'$  (1.803 m), weight 131 kg, last menstrual period 06/08/2020, SpO2 100 %. Constitutional: No distress . Vital signs reviewed. BMI 40.28 HEENT: NCAT, EOMI, oral membranes moist, sitting in chair Neck: supple Cardiovascular: RRR Respiratory/Chest: CTA Bilaterally without wheezes or rales. Normal effort    GI/Abdomen: BS +, non-tender, non-distended Ext: no clubbing, cyanosis, or edema Psych: pleasant and cooperative  Skin: incision CDI on scalp, warm and dry Musculoskeletal:        General: No swelling.     Comments: Right heel cord is tight R knee buckling Can sit to stand in [place] Neurological:     Mental Status: She is alert.     Motor: Weakness present.     Comments: Alert and oriented x 3. Normal insight and awareness. Intact Memory. Normal language and speech. Cranial nerve exam unremarkable for mild right central 7 still present. RUE 0/5 deltoids, 2+ to 3/5 biceps, triceps  and 4-5/5 wrist and fingers. RLE: 1/5 HF/HAB and 0/5 elsewhere. DTR's 1+. Sensory exam normal for light touch and pain in all 4 limbs. No limb ataxia or cerebellar signs. No abnormal tone appreciated.   Functional mobility: self-propelling WC with  hemi-technique, can don socks and shoes set-up    Assessment/Plan: 1. Functional deficits which require 3+ hours per day of interdisciplinary therapy in a comprehensive inpatient rehab setting. Physiatrist is providing close team supervision and 24 hour management of active medical problems listed below. Physiatrist and rehab team continue to assess barriers to discharge/monitor patient progress toward functional and medical goals  Care Tool:  Bathing    Body parts bathed by patient: Right arm, Chest, Left arm, Abdomen, Front perineal area, Right upper leg, Left upper leg, Right lower leg, Left lower leg, Face, Buttocks   Body parts bathed by helper: Buttocks     Bathing assist Assist Level: Set up assist     Upper Body Dressing/Undressing Upper body dressing   What is the patient wearing?: Pull over shirt    Upper body assist Assist Level: Set up assist    Lower Body Dressing/Undressing Lower body dressing      What is the patient wearing?: Pants     Lower body assist Assist for lower body dressing: Supervision/Verbal cueing (EOB)     Toileting Toileting    Toileting assist Assist for toileting: Dependent - Patient  0% (stedy)     Transfers Chair/bed transfer  Transfers assist     Chair/bed transfer assist level: Minimal Assistance - Patient > 75% (stand<>pivot)     Locomotion Ambulation   Ambulation assist   Ambulation activity did not occur: Safety/medical concerns (0/5 R LE MMT)  Assist level: 2 helpers Assistive device: Walker-rolling Max distance: >446f   Walk 10 feet activity   Assist  Walk 10 feet activity did not occur: Safety/medical concerns (0/5 R LE MMT)  Assist level: 2 helpers Assistive device: Walker-rolling   Walk 50 feet activity   Assist Walk 50 feet with 2 turns activity did not occur: Safety/medical concerns (0/5 R LE MMT)  Assist level: 2 helpers Assistive device: Walker-rolling    Walk 150 feet  activity   Assist Walk 150 feet activity did not occur: Safety/medical concerns (0/5 R LE MMT)  Assist level: 2 helpers Assistive device: Walker-rolling    Walk 10 feet on uneven surface  activity   Assist Walk 10 feet on uneven surfaces activity did not occur: Safety/medical concerns (0/5 R LE MMT)         Wheelchair     Assist Is the patient using a wheelchair?: Yes Type of Wheelchair: Manual Wheelchair activity did not occur: Safety/medical concerns (Per PT)  Wheelchair assist level: Independent Max wheelchair distance: >1557f   Wheelchair 50 feet with 2 turns activity    Assist        Assist Level: Independent   Wheelchair 150 feet activity     Assist      Assist Level: Independent   Blood pressure 125/63, pulse 64, temperature 98.3 F (36.8 C), temperature source Oral, resp. rate 18, height '5\' 11"'$  (1.803 m), weight 131 kg, last menstrual period 06/08/2020, SpO2 100 %.    Medical Problem List and Plan: 1. Functional deficits secondary to left posterior frontal lobe metastatic breast cancer, s/p resection with right-sided weakness             -patient may shower             -ELOS/Goals: 14-20 days, min assist goals with PT/OT and mod I with SLP             -keep RUE supported given proximal muscle weakness.              -PRAFO to help support/stretch right heel cord. Consider night splint  Continue CIR therapies including PT, OT, SLP  Provided with podcast recommendations to learn more about role of diet in cancer prevention/treatment  [facility] on [facility] options  Provided with Eat to Beat Disease book  Provided information regarding integrative oncologist   She ambulated 171 ft with PT and RW and min A 2.  S/p craniotomy with impaired mobility -DVT/anticoagulation:  Mechanical:  Antiembolism stockings, knee (TED hose) Bilateral lower extremities Sequential  compression devices, below knee Bilateral lower extremities Transitioned to Eliquis as per NSGY recommendation. Discussed that she should continue this until follow-up with NSGY outpatient.  3. Postoperative pain: Tylenol, Decrease Norco to 1 tab q6H as needed. Provided with pain relief journal.  4. Mood: LCSW to evaluate and provide emotional support             -antipsychotic agents: n/a 5. Neuropsych: This patient is capable of making decisions on her own behalf. 6. Craniotomy incision site: staples removed  without complication. Site inspected and hearing well. 7. 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 Will see if can use BSC since will have been seen by therapy at that point.   5/29: 2 large BM's 5/27- con't regimen- use sorbitol prn  6/9 BM today, improved, continue current regimen 11: Sensitivity to [employer] laundry products; is not allergic to baking powder 12: Intermittent elevated BP: continue to monitor --discontinue clonidine 13: Morbid obesity: BMI 43.97; dietary counseling  14. Insomnia: continue melatonin  15. Leukocytosis  5/27- WBC 15.9- but since last labs, have new Decadron 4 mg q6 hours- afebrile- no signs/Sx's of illness- will recheck Monday and monitor  5/29 WBC is 16.5- discussed with patient that this has been stable.   6/7 steroid wean discussed with NSGY  6/9 WBC 8.7 improved, continue to monitor 16. Hyperglycemia  5/28- started sensitive SSI and CBGs since  had BG of 207 on BMP yesterday.  5/29- discussed how decadron will affects cbg's. Hopefully with taper we'll see improvement. Sugars still high this morning  5/30: start metformin '500mg'$  daily.   5/31: increase metformin to '500mg'$  BID.   6/1: increase metformin to '850mg'$   6/3 please check with NSGY on Monday if it would be ok to taper steroids to '2mg'$  q8H.   6/5 adjusted her CBG schedule to start at 4am for fasting, called NSGY, left message with my information  6/6: decrease steroids to '2mg'$  q8H and will ask NSGY today weaning schedule for steroids 6/7: wean steroids to BID on 6/10  6/8: requested pharmacy to put in following steroid wean and discussed with patient: 6/12: '1mg'$  TID, 6/16: '1mg'$  BID, 6/20: '1mg'$  daily for 4 days then off. Discussed plan to stop metformin/CBGs/insulin tomorrow.   6/9: Stopped metformin per Kr Raulkars plan yesterday, will continue to follow  CBG 17. Rash on chest: hydrocortisone cream ordered, change to prn 18. Tachycardia: resolved, continue to monitor HR TID          LOS: 14 days A FACE TO FACE EVALUATION WAS PERFORMED  Jennye Boroughs 12/01/2021, 8:49 AM

## 2021-12-01 NOTE — Progress Notes (Signed)
Occupational Therapy Session Note  Patient Details  Name: Sheryl Mcmillan MRN: [id number] Date of Birth: 26-Apr-1976  Today's Date: 12/01/2021 OT Individual Time: 6967-8938 OT Individual Time Calculation (min): 57 min    Short Term Goals: Week 2:  OT Short Term Goal 1 (Week 2): Pt will consistently transfer to toilet wiht MIN A and LRAD OT Short Term Goal 2 (Week 2): Pt will complete sit > stand in prep for ADL task with min A using LRAD OT Short Term Goal 3 (Week 2): Pt will donn LB clothing with AE PRN, with min A  Skilled Therapeutic Interventions/Progress Updates:  Skilled OT intervention completed with focus on ambulatory shower transfers, dynamic balance. Pt received seated EOB, no c/o pain. Completed sit > stand with CGA using RW then min A stand pivot using RW to w/c. No knee buckling at this time. Self-propelled in w/c > ADL bathroom > gym > room with mod I using hemi-technique. To better simulate pt's home bathroom, pt completed sit > stand using RW with min A then short ambulatory transfer using RW  from w/c <> tub bench through entry way door with min A. No RLE buckling noted. Initial min A needed for RLE management over tub threshold, fading to supervision with use of gait belt to lift with education provided about hooking method also. Discussed this therapist's recommendations for at least min A maybe CGA at time of d/c provided from pt's daughter who is a CNA to assist her with walking into the bathroom and with shower transfers for safety purposes 2/2 RLE weakness and cues needed frequently during sessions to remind pt to pace herself for safety/body awareness.   In gym, standing at tv with wii game, pt participated in 2 standing endurance trials to promote dynamic balance and overall endurance needed for ADL tasks. Pt able to maintain stance for 2-3 games each trial, using RW with min A needed for sit <> stand and CGA for balance. R knee buckling with no physical assist needed to  correct balance only noted on last trial towards later time, indicating fatigue. Pt profusely sweating however denied fatigue. Therapist has routinely encouraged and recommended that pt listen to her body about fatigue level vs pushing higher than her body is willing at the time, to avoid compromising safety vs high goals. Pt was left seated in w/c, with daughter present and all needs in reach at end of session.   Therapy Documentation Precautions:  Precautions Precautions: Fall Restrictions Weight Bearing Restrictions: No    Therapy/Group: Individual Therapy  Blase Mess, MS, OTR/L   12/01/2021, 7:51 AM

## 2021-12-01 NOTE — Progress Notes (Signed)
Occupational Therapy Session Note  Patient Details  Name: Sheryl Mcmillan MRN: [id number] Date of Birth: 08-13-1975  Today's Date: 12/01/2021 OT Individual Time: 1000-1100 OT Individual Time Calculation (min): 60 min    Short Term Goals: Week 2:  OT Short Term Goal 1 (Week 2): Pt will consistently transfer to toilet wiht MIN A and LRAD OT Short Term Goal 2 (Week 2): Pt will complete sit > stand in prep for ADL task with min A using LRAD OT Short Term Goal 3 (Week 2): Pt will donn LB clothing with AE PRN, with min A  Skilled Therapeutic Interventions/Progress Updates:      Therapy Documentation Precautions:  Precautions Precautions: Fall Restrictions Weight Bearing Restrictions: No General: Upon OT arrival, pt seated in w/c reporting no pain and "wanting to work". Pt declining self care tasks this session. Pt requesting to do a higher level IADL task involving cooking before she discharges and a plan was discussed. Pt propelled herself in w/c to therapy gym. Therapy session focused on endurance, standing tolerance, dynamic standing balance, R UE functional use, and education including progress towards goals and importance of rest breaks and safety to reduce risk for falls. Pt requires Min A-CGA for functional transfers, Min A-Mod Afor dynamic standing balance, and CGA for static standing balance. Pt stands at tabletop for ~75 minutes to replicate 2 pipe designs using B UE. Pt disassembled designs while standing and sat in w/c to clean items per pt request. Pt stands at tabletop for second trial to place push pins onto pattern with the R UE only demonstrating good standing tolerance and good- balance. Pt removed all pieces and returned to container. Pt engaged in shooting basketball task while standing with RW retrieving ball from this therapist in all planes with Min difficulty. Noted greater knee buckling with fatigue and decreased insight with safety requiring verbal cues to recognize and  correct stance. Pt able to complete 2 trials for ~15 minutes total and seated rest break in between task. Pt propelled herself back to her room in w/c Mod I and was left with all needs met.    Therapy/Group: Individual Therapy  Marvetta Gibbons 12/01/2021, 12:26 PM

## 2021-12-01 NOTE — Progress Notes (Signed)
Physical Therapy Session Note  Patient Details  Name: Sheryl Mcmillan MRN: [id number] Date of Birth: 10-Nov-1975  Today's Date: 12/01/2021 PT Individual Time: 0730-0840 PT Individual Time Calculation (min): 70 min   Short Term Goals: Week 1:  PT Short Term Goal 1 (Week 1): Pt will transfer bed<>chair with CGA PT Short Term Goal 1 - Progress (Week 1): Met PT Short Term Goal 2 (Week 1): Pt will complete sit to stand from chair height with MinA + RW PT Short Term Goal 2 - Progress (Week 1): Progressing toward goal PT Short Term Goal 3 (Week 1): Pt will complete amb of 10 ft with ModA + RW PT Short Term Goal 3 - Progress (Week 1): Progressing toward goal Week 2:  PT Short Term Goal 1 (Week 2): pt will transfer sit<>stand with LRAD and min A PT Short Term Goal 2 (Week 2): pt will transfer stand<>pivot with LRAD and mod A PT Short Term Goal 3 (Week 2): Pt will initate gait training with RW  Skilled Therapeutic Interventions/Progress Updates:   Received pt sitting EOB with new R GRAFO donned. Pt agreeable to PT treatment and denied any pain during session. Session with emphasis on functional mobility/transfers, generalized strengthening and endurance, dynamic standing balance/coordination, simulated car transfers, and gait training. Pt transferred bed<>WC stand<>pivot without AD and CGA/min A and sat in WC at sink and washed face/brushed teeth mod I. Pt performed WC mobility 147f using R hemi technique and mod I to main therapy gym. Provided pt with 22x18 WC to trial as CSW reported that was the only size she could get without custom WC consult. Pt propelled additional 1073fin new WC with supervision/mod I and pt reports liking the new chair and thinking that the 22x18 will fit inside her home, but that her daughter is completing the home measurement sheet this weekend. Stood at staircase with min A with BUE support and worked on stepping RLE on/off 6in step 2x10 reps with emphasis on R hip flexion  strength and control of placement of RLE. Pt with increased difficulty with eccentric control, lowering RLE from step. Pt transferred sit<>stand with bari RW and min A and ambulated [id number] bariatric RW and min A (with 1 instance of R knee buckling requiring mod A to correct). Took active seated rest break stretching UEs, then ambulated [id number] and [id number] with only 1 instance of R knee buckling requiring heavy min/light mod A to correct. Pt ambulates with R knee "bouncing" and hyperextension and when distracted, experiences buckling of R knee - requiring cues for focused attention and gait mechanics. Stand<>pivot mat<>WC with bariatric RW and min A and performed WC mobility 100ft 29fg R hemi technique to ortho gym. Pt then performed simulated car transfer with bariatric RW and min A and able to get RLE in/out of car using UEs. Pt performed WC mobility >300ft u25f R hemi technique and mod I back to room. Consulted with OT and updated safety plan to stand<>pivot with RW and removed Stedy from room - RN made aware and present to administer medications. [facility].   Therapy Documentation Precautions:  Precautions Precautions: Fall Restrictions Weight Bearing Restrictions: No  Therapy/Group: Individual Therapy Canyon Lohr M Alfonse AlpersT  12/01/2021, 7:03 AM

## 2021-12-01 NOTE — Progress Notes (Signed)
Patient requesting bed alarm be off. She has demonstrated that she will not get up out of bed and safety in previous shifts. She has been educated on safety and the reason for bed alarm. Bed alarm is off at this time,charge aware. No other issues to report.

## 2021-12-02 DIAGNOSIS — E669 Obesity, unspecified: Secondary | ICD-10-CM

## 2021-12-02 DIAGNOSIS — E1169 Type 2 diabetes mellitus with other specified complication: Secondary | ICD-10-CM

## 2021-12-02 LAB — GLUCOSE, CAPILLARY: Glucose-Capillary: 185 mg/dL — ABNORMAL HIGH (ref 70–99)

## 2021-12-02 NOTE — Progress Notes (Signed)
PROGRESS NOTE   Subjective/Complaints: No new issues this morning. Remains very motivated and thankful for all the help the rehab team has given her!  ROS: Patient denies fever, rash, sore throat, blurred vision, dizziness, nausea, vomiting, diarrhea, cough, shortness of breath or chest pain, joint or back/neck pain, headache, or mood change.  Objective:   No results found. Recent Labs    12/01/21 0621  WBC 8.7  HGB 13.5  HCT 41.5  PLT 214     Recent Labs    12/01/21 0621  NA 137  K 3.6  CL 105  CO2 21*  GLUCOSE 224*  BUN 11  CREATININE 0.76  CALCIUM 8.9      Intake/Output Summary (Last 24 hours) at 12/02/2021 1103 Last data filed at 12/02/2021 0747 Gross per 24 hour  Intake 780 ml  Output --  Net 780 ml        Physical Exam: Vital Signs Blood pressure 135/89, pulse 65, temperature (!) 97.5 F (36.4 C), temperature source Oral, resp. rate 18, height '5\' 11"'$  (1.803 m), weight 130.4 kg, last menstrual period 06/08/2020, SpO2 100 %. Constitutional: No distress . Vital signs reviewed. HEENT: NCAT, EOMI, oral membranes moist Neck: supple Cardiovascular: RRR without murmur. No JVD    Respiratory/Chest: CTA Bilaterally without wheezes or rales. Normal effort    GI/Abdomen: BS +, non-tender, non-distended Ext: no clubbing, cyanosis, or edema Psych: pleasant and cooperative  Skin: incision CDI on scalp, warm and dry Musculoskeletal:        General: No swelling.     Comments: Right heel cord remains tight but can be stretched to neutral Can sit to stand in [place] Neurological:     Mental Status: She is alert.     Motor: Weakness present.     Comments: Alert and oriented x 3. Normal insight and awareness. Intact Memory. Normal language and speech. Cranial nerve exam unremarkable for mild right central 7 still present. RUE 0/5 deltoids, 2+ to 3/5 biceps, triceps  and 4-5/5 wrist and fingers. RLE: 2+/5 HF/HAB and  1-2/5 elsewhere. DTR's 1+. Sensory exam normal for light touch and pain in all 4 limbs. Sensory exam normal for light touch and pain in all 4 limbs. No limb ataxia or cerebellar signs. No abnormal tone appreciated.      Assessment/Plan: 1. Functional deficits which require 3+ hours per day of interdisciplinary therapy in a comprehensive inpatient rehab setting. Physiatrist is providing close team supervision and 24 hour management of active medical problems listed below. Physiatrist and rehab team continue to assess barriers to discharge/monitor patient progress toward functional and medical goals  Care Tool:  Bathing    Body parts bathed by patient: Right arm, Chest, Left arm, Abdomen, Front perineal area, Right upper leg, Left upper leg, Right lower leg, Left lower leg, Face, Buttocks   Body parts bathed by helper: Buttocks     Bathing assist Assist Level: Set up assist     Upper Body Dressing/Undressing Upper body dressing   What is the patient wearing?: Pull over shirt    Upper body assist Assist Level: Set up assist    Lower Body Dressing/Undressing Lower body dressing  What is the patient wearing?: Pants     Lower body assist Assist for lower body dressing: Supervision/Verbal cueing (EOB)     Toileting Toileting    Toileting assist Assist for toileting: Dependent - Patient 0% (stedy)     Transfers Chair/bed transfer  Transfers assist     Chair/bed transfer assist level: Minimal Assistance - Patient > 75% (stand<>pivot)     Locomotion Ambulation   Ambulation assist   Ambulation activity did not occur: Safety/medical concerns (0/5 R LE MMT)  Assist level: 2 helpers Assistive device: Walker-rolling Max distance: >455f   Walk 10 feet activity   Assist  Walk 10 feet activity did not occur: Safety/medical concerns (0/5 R LE MMT)  Assist level: 2 helpers Assistive device: Walker-rolling   Walk 50 feet activity   Assist Walk 50 feet with  2 turns activity did not occur: Safety/medical concerns (0/5 R LE MMT)  Assist level: 2 helpers Assistive device: Walker-rolling    Walk 150 feet activity   Assist Walk 150 feet activity did not occur: Safety/medical concerns (0/5 R LE MMT)  Assist level: 2 helpers Assistive device: Walker-rolling    Walk 10 feet on uneven surface  activity   Assist Walk 10 feet on uneven surfaces activity did not occur: Safety/medical concerns (0/5 R LE MMT)         Wheelchair     Assist Is the patient using a wheelchair?: Yes Type of Wheelchair: Manual Wheelchair activity did not occur: Safety/medical concerns (Per PT)  Wheelchair assist level: Independent Max wheelchair distance: >1535f   Wheelchair 50 feet with 2 turns activity    Assist        Assist Level: Independent   Wheelchair 150 feet activity     Assist      Assist Level: Independent   Blood pressure 135/89, pulse 65, temperature (!) 97.5 F (36.4 C), temperature source Oral, resp. rate 18, height '5\' 11"'$  (1.803 m), weight 130.4 kg, last menstrual period 06/08/2020, SpO2 100 %.    Medical Problem List and Plan: 1. Functional deficits secondary to left posterior frontal lobe metastatic breast cancer, s/p resection with right-sided weakness             -patient may shower             -ELOS/Goals: 14-20 days, min assist goals with PT/OT and mod I with SLP             -keep RUE supported given proximal muscle weakness.              -PRAFO to help support/stretch right heel cord. Consider night splint  .cocnirs   Provided with podcast recommendations to learn more about role of diet in cancer prevention/treatment  [facility] on [facility]  Provided with Eat to Beat Disease book  Provided information regarding integrative oncologist   She ambulated 171 ft with PT and RW and min A 2.  S/p craniotomy with impaired  mobility -DVT/anticoagulation:  Mechanical:  Antiembolism stockings, knee (TED hose) Bilateral lower extremities Sequential compression devices, below knee Bilateral lower extremities Transitioned to Eliquis as per NSGY recommendation. Discussed that she should continue this until follow-up with NSGY outpatient.  3. Postoperative pain: Tylenol, Decrease Norco to 1 tab q6H as needed. Provided with pain relief journal.  4. Mood: LCSW to evaluate and provide emotional support             -  antipsychotic agents: n/a 5. Neuropsych: This patient is capable of making decisions on her own behalf. 6. Craniotomy incision site: staples removed  without complication. Site inspected and hearing well. 7. 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 Will see if can use BSC since will have been seen by therapy at that point.   5/29: 2 large BM's 5/27- con't regimen- use sorbitol prn  6/10 BM yesterday,  continue current regimen 11: Sensitivity to [employer] laundry products; is not allergic to baking powder 12: Intermittent elevated BP: continue to monitor --discontinue clonidine 13: Morbid obesity: BMI 43.97; dietary counseling  14. Insomnia: continue melatonin  15. Leukocytosis  5/27- WBC 15.9- but since last labs, have new Decadron 4 mg q6 hours- afebrile- no signs/Sx's of illness- will recheck Monday and monitor  5/29 WBC is 16.5- discussed with patient that this has been stable.   6/7 steroid wean discussed with  NSGY  6/9 WBC 8.7 improved, continue to monitor 16. Hyperglycemia  5/28- started sensitive SSI and CBGs since had BG of 207 on BMP yesterday.  5/29- discussed how decadron will affects cbg's. Hopefully with taper we'll see improvement. Sugars still high this morning  5/30: start metformin '500mg'$  daily.   5/31: increase metformin to '500mg'$  BID.   6/1: increase metformin to '850mg'$   6/3 please check with NSGY on Monday if it would be ok to taper steroids to '2mg'$  q8H.   6/5 adjusted her CBG schedule to start at 4am for fasting, called NSGY, left message with my information  6/6: decrease steroids to '2mg'$  q8H and will ask NSGY today weaning schedule for steroids 6/7: wean steroids to BID on 6/10  6/8: requested pharmacy to put in following steroid wean and discussed with patient: 6/12: '1mg'$  TID, 6/16: '1mg'$  BID, 6/20: '1mg'$  daily for 4 days then off. Discussed plan to stop metformin/CBGs/insulin tomorrow.   6/9: Stopped metformin per Kr Raulkars plan yesterday, will continue to follow  CBG  CBG (last 3)  Recent Labs    12/01/21 1514 12/01/21 2119 12/02/21 0350  GLUCAP 126* 246* 185*    6/10 cbgs remain elevated despite steroid taper, may ultimately need to renew metformin or similar 17. Rash on chest: hydrocortisone cream ordered, change to prn 18. Tachycardia: resolved, continue to monitor HR TID          LOS: 15 days A FACE TO FACE EVALUATION WAS PERFORMED  Meredith Staggers 12/02/2021, 11:03 AM

## 2021-12-03 NOTE — Progress Notes (Signed)
Occupational Therapy Session Note  Patient Details  Name: Sheryl Mcmillan MRN: [id number] Date of Birth: 06/10/1976  Today's Date: 12/03/2021 OT Individual Time: 1500-1600 OT Individual Time Calculation (min): 60 min    Short Term Goals: Week 2:  OT Short Term Goal 1 (Week 2): Pt will consistently transfer to toilet wiht MIN A and LRAD OT Short Term Goal 2 (Week 2): Pt will complete sit > stand in prep for ADL task with min A using LRAD OT Short Term Goal 3 (Week 2): Pt will donn LB clothing with AE PRN, with min A   Therapy Documentation Precautions:  Precautions Precautions: Fall Restrictions Weight Bearing Restrictions: No General:   Upon OT arrival, pt seated in w/c. No pain reported but pt states "I want to work on this right shoulder". Pt transported herself down to dayroom therapy gym viz w/c with Mod I. Pt sat tabletop to complete towel exercises with the R UE to improve ROM during self care. Pt completes in windshield wiper motion, laterally, diagonally, and spells her name 3 times at a slow and controlled pace noting good awareness of posture throughout. Pt was then transported to larger therapy gym for time management to complete finger ladder 10 x to 20th notch with good control and demonstrating improved dexterity in fingers. Pt was then transported to smaller gym to complete sci fit arm bike for 10 minutes and a tension level of 3 at a steady pace to improve strength and endurance during functional mobility and transfers. Pt requires no rest break during task. Pt making progress towards stated OT goals and continues to benefit from OT services to achieve highest level of independence and safety. Pt was transported back to room and left in w/c with all needs met.    Therapy/Group: Individual Therapy  Kayla  Hirschfelder 12/03/2021, 4:38 PM 

## 2021-12-03 NOTE — Progress Notes (Signed)
PROGRESS NOTE   Subjective/Complaints: Pt in good spirits. Good appetite. No pain.   ROS: Patient denies fever, rash, sore throat, blurred vision, dizziness, nausea, vomiting, diarrhea, cough, shortness of breath or chest pain, joint or back/neck pain, headache, or mood change.    Objective:   No results found. Recent Labs    12/01/21 0621  WBC 8.7  HGB 13.5  HCT 41.5  PLT 214     Recent Labs    12/01/21 0621  NA 137  K 3.6  CL 105  CO2 21*  GLUCOSE 224*  BUN 11  CREATININE 0.76  CALCIUM 8.9      Intake/Output Summary (Last 24 hours) at 12/03/2021 0955 Last data filed at 12/03/2021 0730 Gross per 24 hour  Intake 1480 ml  Output --  Net 1480 ml        Physical Exam: Vital Signs Blood pressure 122/87, pulse 68, temperature 98 F (36.7 C), resp. rate 18, height '5\' 11"'$  (1.803 m), weight 130.4 kg, last menstrual period 06/08/2020, SpO2 100 %. Constitutional: No distress . Vital signs reviewed. obese HEENT: NCAT, EOMI, oral membranes moist Neck: supple Cardiovascular: RRR without murmur. No JVD    Respiratory/Chest: CTA Bilaterally without wheezes or rales. Normal effort    GI/Abdomen: BS +, non-tender, non-distended Ext: no clubbing, cyanosis, or edema Psych: pleasant and cooperative  Skin: incision CDI on scalp, warm and dry Musculoskeletal:        General: No swelling.     Comments: Right heel cord remains tight but can be stretched to neutral Can sit to stand in [place] Neurological:     Mental Status: She is alert.     Motor: Weakness present.     Comments: Alert and oriented x 3. Normal insight and awareness. Intact Memory. Normal language and speech. Cranial nerve exam unremarkable for mild right central 7 still present. RUE 0/5 deltoids, 2+ to 3/5 biceps, triceps  and 4-5/5 wrist and fingers. RLE: 2+/5 HF/HAB and 1-2/5 elsewhere. DTR's 1+. Sensory exam normal for light touch and pain in all 4  limbs. Sensory exam normal for light touch and pain in all 4 limbs. No limb ataxia or cerebellar signs. No abnormal tone appreciated.  Motor/sensory exam unchanged 6/11    Assessment/Plan: 1. Functional deficits which require 3+ hours per day of interdisciplinary therapy in a comprehensive inpatient rehab setting. Physiatrist is providing close team supervision and 24 hour management of active medical problems listed below. Physiatrist and rehab team continue to assess barriers to discharge/monitor patient progress toward functional and medical goals  Care Tool:  Bathing    Body parts bathed by patient: Right arm, Chest, Left arm, Abdomen, Front perineal area, Right upper leg, Left upper leg, Right lower leg, Left lower leg, Face, Buttocks   Body parts bathed by helper: Buttocks     Bathing assist Assist Level: Set up assist     Upper Body Dressing/Undressing Upper body dressing   What is the patient wearing?: Pull over shirt    Upper body assist Assist Level: Set up assist    Lower Body Dressing/Undressing Lower body dressing      What is the patient wearing?: Pants  Lower body assist Assist for lower body dressing: Supervision/Verbal cueing (EOB)     Toileting Toileting    Toileting assist Assist for toileting: Dependent - Patient 0% (stedy)     Transfers Chair/bed transfer  Transfers assist     Chair/bed transfer assist level: Minimal Assistance - Patient > 75% (stand<>pivot)     Locomotion Ambulation   Ambulation assist   Ambulation activity did not occur: Safety/medical concerns (0/5 R LE MMT)  Assist level: 2 helpers Assistive device: Walker-rolling Max distance: >421f   Walk 10 feet activity   Assist  Walk 10 feet activity did not occur: Safety/medical concerns (0/5 R LE MMT)  Assist level: 2 helpers Assistive device: Walker-rolling   Walk 50 feet activity   Assist Walk 50 feet with 2 turns activity did not occur: Safety/medical  concerns (0/5 R LE MMT)  Assist level: 2 helpers Assistive device: Walker-rolling    Walk 150 feet activity   Assist Walk 150 feet activity did not occur: Safety/medical concerns (0/5 R LE MMT)  Assist level: 2 helpers Assistive device: Walker-rolling    Walk 10 feet on uneven surface  activity   Assist Walk 10 feet on uneven surfaces activity did not occur: Safety/medical concerns (0/5 R LE MMT)         Wheelchair     Assist Is the patient using a wheelchair?: Yes Type of Wheelchair: Manual Wheelchair activity did not occur: Safety/medical concerns (Per PT)  Wheelchair assist level: Independent Max wheelchair distance: >1523f   Wheelchair 50 feet with 2 turns activity    Assist        Assist Level: Independent   Wheelchair 150 feet activity     Assist      Assist Level: Independent   Blood pressure 122/87, pulse 68, temperature 98 F (36.7 C), resp. rate 18, height '5\' 11"'$  (1.803 m), weight 130.4 kg, last menstrual period 06/08/2020, SpO2 100 %.    Medical Problem List and Plan: 1. Functional deficits secondary to left posterior frontal lobe metastatic breast cancer, s/p resection with right-sided weakness             -patient may shower             -ELOS/Goals: 14-20 days, min assist goals with PT/OT and mod I with SLP             -keep RUE supported given proximal muscle weakness.              -PRAFO to help support/stretch right heel cord. Consider night splint  .cocnirs   Provided with podcast recommendations to learn more about role of diet in cancer prevention/treatment  [facility] on [facility]  Provided with Eat to Beat Disease book  Provided information regarding integrative oncologist   -Continue CIR therapies including PT, OT  2.  S/p craniotomy with impaired mobility -DVT/anticoagulation:  Mechanical:  Antiembolism stockings, knee (TED hose)  Bilateral lower extremities Sequential compression devices, below knee Bilateral lower extremities Transitioned to Eliquis as per NSGY recommendation. Discussed that she should continue this until follow-up with NSGY outpatient.  3. Postoperative pain: Tylenol, Decrease Norco to 1 tab q6H as needed. Provided with pain relief journal.  4. Mood: LCSW to evaluate and provide emotional support             -antipsychotic agents: n/a 5. Neuropsych: This patient is capable of making decisions on her own behalf.  6. Craniotomy incision site: staples removed  without complication. Site inspected and hearing well. 7. 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 Will see if can use BSC since will have been seen by therapy at that point.   5/29: 2 large BM's 5/27- con't regimen- use sorbitol prn  6/11 lbm 6/9 11: Sensitivity to [employer] laundry products; is not allergic to baking powder 12: Intermittent elevated BP: continue to monitor --discontinue clonidine 13: Morbid obesity: BMI 43.97; dietary counseling  14. Insomnia: continue melatonin  15. Leukocytosis  5/27- WBC 15.9- but since last labs, have new Decadron 4 mg q6 hours- afebrile- no signs/Sx's of illness- will recheck Monday and monitor  5/29 WBC is 16.5- discussed with patient that this has been stable.   6/7 steroid wean discussed with NSGY  6/9 WBC 8.7 improved, continue to monitor 16. Hyperglycemia  5/28- started sensitive SSI and CBGs since had  BG of 207 on BMP yesterday.  5/29- discussed how decadron will affects cbg's. Hopefully with taper we'll see improvement. Sugars still high this morning  5/30: start metformin '500mg'$  daily.   5/31: increase metformin to '500mg'$  BID.   6/1: increase metformin to '850mg'$   6/3 please check with NSGY on Monday if it would be ok to taper steroids to '2mg'$  q8H.   6/5 adjusted her CBG schedule to start at 4am for fasting, called NSGY, left message with my information  6/6: decrease steroids to '2mg'$  q8H and will ask NSGY today weaning schedule for steroids 6/7: wean steroids to BID on 6/10  6/8: requested pharmacy to put in following steroid wean and discussed with patient: 6/12: '1mg'$  TID, 6/16: '1mg'$  BID, 6/20: '1mg'$  daily for 4 days then off. Discussed plan to stop metformin/CBGs/insulin tomorrow.   6/9: Stopped metformin per Kr Raulkars plan yesterday, will continue to follow  CBG  CBG (last 3)  Recent Labs    12/01/21 1514 12/01/21 2119 12/02/21 0350  GLUCAP 126* 246* 185*    6/10 cbgs remain elevated despite steroid taper, may ultimately need to renew metformin or similar 17. Rash on chest: hydrocortisone cream ordered, change to prn 18. Tachycardia: resolved, continue to monitor HR TID          LOS: 16 days A FACE TO FACE EVALUATION WAS PERFORMED  Meredith Staggers 12/03/2021, 9:55 AM

## 2021-12-04 DIAGNOSIS — R000 Tachycardia, unspecified: Secondary | ICD-10-CM

## 2021-12-04 DIAGNOSIS — R030 Elevated blood-pressure reading, without diagnosis of hypertension: Secondary | ICD-10-CM

## 2021-12-04 NOTE — Progress Notes (Signed)
PROGRESS NOTE   Subjective/Complaints: Working with therapy in [place]. No new concerns.   ROS: No fevers, chills, SOB, HA or CP   Objective:   No results found. No results for input(s): "WBC", "HGB", "HCT", "PLT" in the last 72 hours.    No results for input(s): "NA", "K", "CL", "CO2", "GLUCOSE", "BUN", "CREATININE", "CALCIUM" in the last 72 hours.     Intake/Output Summary (Last 24 hours) at 12/04/2021 1146 Last data filed at 12/04/2021 0752 Gross per 24 hour  Intake 720 ml  Output --  Net 720 ml         Physical Exam: Vital Signs Blood pressure 118/73, pulse 70, temperature 97.7 F (36.5 C), resp. rate 15, height '5\' 11"'$  (1.803 m), weight 130.4 kg, last menstrual period 06/08/2020, SpO2 99 %.   Constitutional: No distress . Vital signs reviewed. Working in gym with therapy HEENT: NCAT, EOMI, oral membranes moist, conjugate gaze Neck: supple Cardiovascular: RRR without murmur. No JVD    Respiratory/Chest: CTA Bilaterally without wheezes or rales. Normal effort    GI/Abdomen: BS +, non-tender, non-distended Ext: no clubbing, cyanosis, or edema Psych: pleasant and cooperative  Skin: incision CDI on scalp, warm and dry Musculoskeletal:        General: No swelling.     Comments: Right heel cord remains tight but can be stretched to neutral Can sit to stand in [place] Neurological:     Mental Status: She is alert.     Motor: Weakness present.     Comments: Alert and oriented x 3. Normal insight and awareness. Intact Memory. Normal language and speech. Cranial nerve exam unremarkable for mild right central 7 still present. RUE 0/5 deltoids, 2+ to 3/5 biceps, triceps  and 4-5/5 wrist and fingers. RLE: 2+/5 HF/HAB and 1-2/5 elsewhere. DTR's 1+. Sensory exam normal for light touch and pain in all 4 limbs. Sensory exam normal for light touch and pain in all 4 limbs. No limb ataxia or cerebellar signs. No abnormal tone  appreciated.  Motor/sensory exam unchanged 6/11    Assessment/Plan: 1. Functional deficits which require 3+ hours per day of interdisciplinary therapy in a comprehensive inpatient rehab setting. Physiatrist is providing close team supervision and 24 hour management of active medical problems listed below. Physiatrist and rehab team continue to assess barriers to discharge/monitor patient progress toward functional and medical goals  Care Tool:  Bathing    Body parts bathed by patient: Right arm, Chest, Left arm, Abdomen, Front perineal area, Right upper leg, Left upper leg, Right lower leg, Left lower leg, Face, Buttocks   Body parts bathed by helper: Buttocks     Bathing assist Assist Level: Set up assist     Upper Body Dressing/Undressing Upper body dressing   What is the patient wearing?: Pull over shirt    Upper body assist Assist Level: Set up assist    Lower Body Dressing/Undressing Lower body dressing      What is the patient wearing?: Pants     Lower body assist Assist for lower body dressing: Supervision/Verbal cueing (EOB)     Toileting Toileting    Toileting assist Assist for toileting: Dependent - Patient 0% (stedy)  Transfers Chair/bed transfer  Transfers assist     Chair/bed transfer assist level: Contact Guard/Touching assist     Locomotion Ambulation   Ambulation assist   Ambulation activity did not occur: Safety/medical concerns (0/5 R LE MMT)  Assist level: 2 helpers Assistive device: Walker-rolling Max distance: >532f   Walk 10 feet activity   Assist  Walk 10 feet activity did not occur: Safety/medical concerns (0/5 R LE MMT)  Assist level: 2 helpers Assistive device: Walker-rolling   Walk 50 feet activity   Assist Walk 50 feet with 2 turns activity did not occur: Safety/medical concerns (0/5 R LE MMT)  Assist level: 2 helpers Assistive device: Walker-rolling    Walk 150 feet activity   Assist Walk 150 feet  activity did not occur: Safety/medical concerns (0/5 R LE MMT)  Assist level: 2 helpers Assistive device: Walker-rolling    Walk 10 feet on uneven surface  activity   Assist Walk 10 feet on uneven surfaces activity did not occur: Safety/medical concerns (0/5 R LE MMT)         Wheelchair     Assist Is the patient using a wheelchair?: Yes Type of Wheelchair: Manual Wheelchair activity did not occur: Safety/medical concerns (Per PT)  Wheelchair assist level: Independent Max wheelchair distance: >1560f   Wheelchair 50 feet with 2 turns activity    Assist        Assist Level: Independent   Wheelchair 150 feet activity     Assist      Assist Level: Independent   Blood pressure 118/73, pulse 70, temperature 97.7 F (36.5 C), resp. rate 15, height '5\' 11"'$  (1.803 m), weight 130.4 kg, last menstrual period 06/08/2020, SpO2 99 %.    Medical Problem List and Plan: 1. Functional deficits secondary to left posterior frontal lobe metastatic breast cancer, s/p resection with right-sided weakness             -patient may shower             -ELOS/Goals: 14-20 days, min assist goals with PT/OT and mod I with SLP             -keep RUE supported given proximal muscle weakness.              -PRAFO to help support/stretch right heel cord. Consider night splint  .cocnirs   Provided with podcast recommendations to learn more about role of diet in cancer prevention/treatment  [facility] on [facility]  Provided with Eat to Beat Disease book  Provided information regarding integrative oncologist   -Continue CIR therapies including PT, OT   -Continue work on coordination and balance 2.  S/p craniotomy with impaired mobility -DVT/anticoagulation:  Mechanical:  Antiembolism stockings, knee (TED hose) Bilateral lower extremities Sequential compression devices, below knee Bilateral lower  extremities Transitioned to Eliquis as per NSGY recommendation. Discussed that she should continue this until follow-up with NSGY outpatient.  3. Postoperative pain: Tylenol, Decrease Norco to 1 tab q6H as needed. Provided with pain relief journal.  4. Mood: LCSW to evaluate and provide emotional support             -antipsychotic agents: n/a 5. Neuropsych: This patient is capable of making decisions on her own behalf. 6. Craniotomy incision site: staples removed  without complication. Site inspected and hearing well. 7. 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 Will see if can use BSC since will have been seen by therapy at that point.   5/29: 2 large BM's 5/27- con't regimen- use sorbitol prn  6/11 lbm 6/9 11: Sensitivity to [employer] laundry products; is not allergic to baking powder 12: Intermittent elevated BP: continue to monitor --discontinue clonidine -6/12 BP well controlled, monitor 13: Morbid obesity: BMI 43.97; dietary counseling  14. Insomnia: continue melatonin  15. Leukocytosis  5/27- WBC 15.9- but since last labs, have new Decadron 4 mg q6 hours- afebrile- no signs/Sx's of illness- will recheck Monday and monitor  5/29 WBC is 16.5- discussed with patient that this has been stable.   6/7 steroid wean discussed with NSGY  6/9 WBC 8.7 improved, continue to monitor 16. Hyperglycemia  5/28- started sensitive SSI and CBGs since had BG of 207 on BMP yesterday.  5/29- discussed how  decadron will affects cbg's. Hopefully with taper we'll see improvement. Sugars still high this morning  5/30: start metformin '500mg'$  daily.   5/31: increase metformin to '500mg'$  BID.   6/1: increase metformin to '850mg'$   6/3 please check with NSGY on Monday if it would be ok to taper steroids to '2mg'$  q8H.   6/5 adjusted her CBG schedule to start at 4am for fasting, called NSGY, left message with my information  6/6: decrease steroids to '2mg'$  q8H and will ask NSGY today weaning schedule for steroids 6/7: wean steroids to BID on 6/10  6/8: requested pharmacy to put in following steroid wean and discussed with patient: 6/12: '1mg'$  TID, 6/16: '1mg'$  BID, 6/20: '1mg'$  daily for 4 days then off. Discussed plan to stop metformin/CBGs/insulin tomorrow.   6/9: Stopped metformin per Kr Raulkars plan yesterday, will continue to follow  CBG  CBG (last 3)  Recent Labs    12/01/21 1514 12/01/21 2119 12/02/21 0350  GLUCAP 126* 246* 185*    6/10 cbgs remain elevated despite steroid taper, may ultimately need to renew metformin or similar  6/12 dexomethasone down to '1mg'$  Q8h, should help with glucose 17. Rash on chest: hydrocortisone cream ordered, change to prn 18. Tachycardia: resolved, continue to monitor HR TID  -HR has been stable in 70s and 80s mostly, follow          LOS: 17 days A FACE TO FACE EVALUATION WAS PERFORMED  Jennye Boroughs 12/04/2021, 11:46 AM

## 2021-12-04 NOTE — Progress Notes (Signed)
Occupational Therapy Session Note  Patient Details  Name: Sheryl Mcmillan MRN: [id number] Date of Birth: Feb 02, 1976  Today's Date: 12/04/2021 OT Individual Time: 0915-1000 OT Individual Time Calculation (min): 45 min    Short Term Goals: Week 2:  OT Short Term Goal 1 (Week 2): Pt will consistently transfer to toilet wiht MIN A and LRAD OT Short Term Goal 2 (Week 2): Pt will complete sit > stand in prep for ADL task with min A using LRAD OT Short Term Goal 3 (Week 2): Pt will donn LB clothing with AE PRN, with min A     Therapy Documentation Precautions:  Precautions Precautions: Fall Restrictions Weight Bearing Restrictions: No General:  Upon OT arrival, pt seated in w/c and reports no pain. Pt agreeable to OT session. Pt self propelled herself in w/c to therapy gym with Mod I. Treatment session focused on R UE gross motor coordination, standing tolerance, and dynamic standing balance. Pt retrieved large plastic pegs from box situated to R of pt with the R UE and placed into foam peg board in desired pattern. Pt able to tolerate task without rest break. Pt filled board and removed pegs from board 2 times noting Min difficulty to retrieve pegs when less pegs were in bucket. Therapist then placed 50 pegs into peg board and pt completes in hand manipulation to pick up 3 pegs at a time and drop into bucket one at a time. Pt demonstrates improved control and slowed movements. Pt then completes standing task at tabletop, crossing midline to retrieve letter magnets using the R UE and reach laterally to place in a bucket. Pt tolerates standing ~10 minutes to return all magnets with noted min R knee buckling but no overt LOB. Noted pt sweating profusely during task but pt does not require rest break. Pt requires min verbal cues to correct posture and improve flexion at the hips while reaching. Pt self propelled in w/c back to room with L UE/LE only, Encouraged R UE use and pt noted to demonstrate less  strength and coordination. Pt making progress towards stated OT goals and continues to benefit from OT services to achieve highest level of independence. Pt left in w/c at end of session with all needs met.    Therapy/Group: Individual Therapy  Marvetta Gibbons 12/04/2021, 9:24 AM

## 2021-12-04 NOTE — Progress Notes (Signed)
Occupational Therapy Session Note  Patient Details  Name: Sheryl Mcmillan MRN: [id number] Date of Birth: 04/26/1976  Today's Date: 12/04/2021 OT Individual Time: 1829-9371 OT Individual Time Calculation (min): 71 min    Short Term Goals: Week 2:  OT Short Term Goal 1 (Week 2): Pt will consistently transfer to toilet wiht MIN A and LRAD OT Short Term Goal 2 (Week 2): Pt will complete sit > stand in prep for ADL task with min A using LRAD OT Short Term Goal 3 (Week 2): Pt will donn LB clothing with AE PRN, with min A  Skilled Therapeutic Interventions/Progress Updates:  Skilled OT intervention completed with focus on ambulatory endurance, dynamic balance without UE support, RUE/BUE strengthening. Pt received seated in w/c, no c/o pain. Ambulated with CGA using RW about 120 ft with +2 assist for w/c following. No knee buckling noted this ambulatory trial. To promote dynamic balance needed for standing ADLs, pt completed the following:  Standing at [place] without AD and CGA -Bells cancellation assessment, missing 3 bells, 3 min run time. -Reaction speed dot- 1.5 sec rx speed on RUE -Trail making A/B- no difficulty with task, however up to min A needed for balance when reaching towards R outside BOS  Boxing with CGA and no AD -Donned bilateral gloves with supervision -boxed in beat patterns, with quick transitions -High-low boxing patterns  Tossing red medium exercise ball overhead with up to min A for balance without AD -Using BUE, with focus on R tricep for strengthening and gross motor control -Tossed x15 into large hula hoop with +2 assist for hula hoop holding  To promote BUE strength needed for ADLs and proximal shoulder stability: - Seated on EOM, using 4 pound dowel- bicep flexion, chest presses - 2 pound dowel- shoulder flexion, overhead presses -Yellow med ball R<>L rotations with arm in 90 degrees shoulder flexion with 2 sec hold Issued pt a HEP for exercises to be completed  with red theraband at home.  Ambulated 120 ft back to room with up to min A for balance using RW, with w/c follow for safety with pt demonstrating increased fatigue on R knee with several knee buckling episodes. Pt was left seated in w/c, with chair alarm on and all needs in reach at end of session.   Therapy Documentation Precautions:  Precautions Precautions: Fall Restrictions Weight Bearing Restrictions: No   Therapy/Group: Individual Therapy  Ellyana Crigler E Valeska Haislip 12/04/2021, 7:49 AM

## 2021-12-04 NOTE — Progress Notes (Signed)
Patient ID: Sheryl Mcmillan, female   DOB: [birth year], 46 y.o.   MRN: [id number]  Information faxed to [place] per patient request. P: [phone] [facility] [phone]

## 2021-12-04 NOTE — Progress Notes (Signed)
Physical Therapy Session Note  Patient Details  Name: Sheryl Mcmillan MRN: [id number] Date of Birth: 03/20/1976  Today's Date: 12/04/2021 PT Individual Time: 0731-0826 PT Individual Time Calculation (min): 55 min   Short Term Goals: Week 1:  PT Short Term Goal 1 (Week 1): Pt will transfer bed<>chair with CGA PT Short Term Goal 1 - Progress (Week 1): Met PT Short Term Goal 2 (Week 1): Pt will complete sit to stand from chair height with MinA + RW PT Short Term Goal 2 - Progress (Week 1): Progressing toward goal PT Short Term Goal 3 (Week 1): Pt will complete amb of 10 ft with ModA + RW PT Short Term Goal 3 - Progress (Week 1): Progressing toward goal Week 2:  PT Short Term Goal 1 (Week 2): pt will transfer sit<>stand with LRAD and min A PT Short Term Goal 2 (Week 2): pt will transfer stand<>pivot with LRAD and mod A PT Short Term Goal 3 (Week 2): Pt will initate gait training with RW  Skilled Therapeutic Interventions/Progress Updates:   Received pt sitting EOB donning R AFO/shoe with set up assist. Pt agreeable to PT treatment and denied any pain during session. RN present to administer pain medication. Session with emphasis on functional mobility/transfers, generalized strengthening and endurance, dynamic standing balance/coordination, stair navigation, and gait training. Pt transferred bed<>WC stand<>pivot without AD and CGA. Pt performed WC mobility 13f x 2 trials using R hemi technique and mod I to/from therapy gym but does require assist to get RLE on footplate of legrest. Pt performed all transfers with bariatric RW and CGA/min A throughout session. Pt ambulated 3444fwith bariatric RW and CGA/min A with close WC follow (no buckling noted). Took seated rest break then ambulated additional [id number] bariatric RW and min A. Pt with 2 instances of LOB requiring mod A to correct (1 when letting go of walker to scratch nose while ambulating and 2 when pt stopped and let go with both UEs to  wipe seat from her face). Educated pt on safety techniques and importance of maintaining balance with UE support. Pt also with increased R toe catching with fatigue and when distracted. Ambulated to staircase with bariatric RW and CGA and pt navigated 8 steps with bilateral handrails and min A ascending and descending with a step to pattern with cues for "up with the good, down with the bad" technique - noted decreased control of RLE when ascending > descending. MD arrived for morning rounds. Worked on blocked practice mini-squats with 1 UE support on RW and CGA/min A for balance 2x12 using mirror for visual feedback - emphasis on R lateral weight shifting, quad control, and upright posture - pt with 1 posterior LOB onto mat. Pt transferred mat<>WC stand<>pivot with RW and close supervision and returned to room. Concluded session [facility] reach and NT present at bedside changing bed linens.   Therapy Documentation Precautions:  Precautions Precautions: Fall Restrictions Weight Bearing Restrictions: No  Therapy/Group: Individual Therapy AnnAlfonse Alpers, DPT  12/04/2021, 6:49 AM

## 2021-12-05 DIAGNOSIS — Z9189 Other specified personal risk factors, not elsewhere classified: Secondary | ICD-10-CM

## 2021-12-05 NOTE — Progress Notes (Signed)
Occupational Therapy Note  Patient Details  Name: Sheryl Mcmillan MRN: [id number] Date of Birth: April 12, 1976  Upon therapist unplanned entry to pt's room around 1435 on 12/04/21, pt was found to be in the shower, seated on tub bench, bathing, with daughter in room, however pt unattended. Of note- of this date, OT has not cleared pt, nursing or family to assist pt with shower transfers or bathing at the shower level 2/2 mildly impulsive behavior, RLE weakness with R knee buckling and safety concerns especially since OT has not had the opportunity to educate daughter on skilled assist level and transfers at this time. Therapist provided re-education about safety implications with transferring to any surface herself while unattended, especially at the shower level with risk of head trauma with a fall substantially high. Pt verbally admitted "I know I got caught" and expressed that she knew the rules that has been (re)-enforced by therapy staff but has chosen to disregard them despite recommendations provided. Therapist notified pt's nurse, of protocol for showers with OT only, and asked that nursing assist her out of the shower at the stand pivot level per the toilet transfer sheet board. Pt was left in the direct care of nursing staff at therapist departure.  [place], MS, OTR/L  12/05/2021, 3:50 PM

## 2021-12-05 NOTE — Progress Notes (Signed)
Occupational Therapy Discharge Summary  Patient Details  Name: Sheryl Mcmillan MRN: [id number] Date of Birth: 08-May-1976   Patient has met 12 of 12 long term goals due to improved activity tolerance, improved balance, ability to compensate for deficits, and functional use of  RIGHT upper and RIGHT lower extremity.  Patient to discharge at overall Supervision for ADLs and CGA level for ambulatory transfers with RW.  Patient's daughter who is a CNA is physically able to provide the necessary physical assistance at discharge and has been present for family education during OT sessions with good return demonstration of assisting pt.   Reasons goals not met: NA Recommendation:  Patient will benefit from ongoing skilled OT services in outpatient setting to continue to advance functional skills in the area of BADL and iADL.  Equipment: Bariatric drop arm BSC, TTB  Reasons for discharge: treatment goals met  Patient/family agrees with progress made and goals achieved: Yes  OT Discharge Precautions/Restrictions  Precautions Precautions: Fall Precaution Comments: R knee buckling Restrictions Weight Bearing Restrictions: No Other Position/Activity Restrictions: GRAFO on RLE for ambulation/transfers ADL ADL Equipment Provided: Long-handled sponge Eating: Modified independent Where Assessed-Eating: Wheelchair Grooming: Modified independent Where Assessed-Grooming: Sitting at sink Upper Body Bathing: Modified independent Where Assessed-Upper Body Bathing: Shower Lower Body Bathing: Supervision/safety Where Assessed-Lower Body Bathing: Shower Upper Body Dressing: Modified independent (Device) Where Assessed-Upper Body Dressing: Edge of bed Lower Body Dressing: Modified independent Where Assessed-Lower Body Dressing: Edge of bed, Bed level (per pt preference) Toileting: Contact guard Where Assessed-Toileting: Bedside Commode Toilet Transfer: Therapist, music Method:  Ambulating, Stand pivot Toilet Transfer Equipment: Bedside commode, Raised toilet seat Tub/Shower Transfer: Contact guard Tub/Shower Transfer Method: Ambulating, Sit pivot Tub/Shower Equipment: Facilities manager: Curator Method: Ambulating, Stand pivot Youth worker: Radio broadcast assistant, Grab bars Vision Baseline Vision/History: 0 No visual deficits Patient Visual Report: No change from baseline Vision Assessment?: No apparent visual deficits Perception  Perception: Within Functional Limits Praxis Praxis: Intact Cognition Cognition Overall Cognitive Status: Within Functional Limits for tasks assessed Arousal/Alertness: Awake/alert Orientation Level: Person;Place;Situation Person: Oriented Place: Oriented Situation: Oriented Memory: Appears intact Awareness: Impaired Problem Solving: Appears intact Safety/Judgment: Impaired Comments: slightly impulsive and decreased insight into deficits Brief Interview for Mental Status (BIMS) Repetition of Three Words (First Attempt): 3 Temporal Orientation: Year: Correct Temporal Orientation: Month: Accurate within 5 days Temporal Orientation: Day: Correct Recall: "Sock": Yes, no cue required Recall: "Blue": Yes, no cue required Recall: "Bed": Yes, no cue required BIMS Summary Score: 15 Sensation Sensation Light Touch: Appears Intact Hot/Cold: Appears Intact Proprioception: Appears Intact Stereognosis: Not tested Coordination Gross Motor Movements are Fluid and Coordinated: No Fine Motor Movements are Fluid and Coordinated: No Finger Nose Finger Test: compensate with elbow d/t proximal weakness Motor  Motor Motor: Hemiplegia Motor - Skilled Clinical Observations: R hemiparesis LE>UE, generalized weakness/deconditioning, and decreased balance/postural control. Mobility     Trunk/Postural Assessment  Cervical Assessment Cervical Assessment: Within Functional  Limits Thoracic Assessment Thoracic Assessment: Within Functional Limits Lumbar Assessment Lumbar Assessment: Exceptions to [facility] (anterior pelvic tilt due to body habitus) Postural Control Postural Control: Deficits on evaluation Protective Responses: delayed, especially when distracted  Balance   Extremity/Trunk Assessment RUE Assessment RUE Assessment: Exceptions to [facility] Active Range of Motion (AROM) Comments: 110 degrees shoulder flexion, WFL distallly General Strength Comments: proximal weakness shoulder with decreased activation; full elbow, wrist and digit activation RUE Body System: Neuro Brunstrum levels for arm and hand: Arm;Hand Brunstrum  level for arm: Stage IV Movement is deviating from synergy Brunstrum level for hand: Stage V Independence from basic synergies LUE Assessment LUE Assessment: Within Functional Limits   Hope E Kluttz, MS, OTR/L  12/05/2021, 7:58 AM

## 2021-12-05 NOTE — Progress Notes (Signed)
PROGRESS NOTE   Subjective/Complaints: Working with PT. She has no new complaints this AM.  We discussed steroid taper  ROS: No fevers, chills, SOB, HA or CP, Nausea   Objective:   No results found. No results for input(s): "WBC", "HGB", "HCT", "PLT" in the last 72 hours.    No results for input(s): "NA", "K", "CL", "CO2", "GLUCOSE", "BUN", "CREATININE", "CALCIUM" in the last 72 hours.     Intake/Output Summary (Last 24 hours) at 12/05/2021 0743 Last data filed at 12/04/2021 1330 Gross per 24 hour  Intake 480 ml  Output --  Net 480 ml         Physical Exam: Vital Signs Blood pressure (!) 141/83, pulse 74, temperature 97.9 F (36.6 C), resp. rate 16, height '5\' 11"'$  (1.803 m), weight 130.4 kg, last menstrual period 06/08/2020, SpO2 100 %.   Constitutional: No distress . Vital signs reviewed.  HEENT: NCAT, EOMI, oral membranes moist, conjugate gaze Neck: supple Cardiovascular: RRR without murmur. No JVD    Respiratory/Chest: CTA Bilaterally without wheezes or rales. Normal effort    GI/Abdomen: BS +, non-tender, non-distended Ext: no clubbing, cyanosis, or edema Psych: very pleasant Skin: incision CDI on scalp Musculoskeletal:        General: No swelling.     Comments: Right heel cord remains tight but can be stretched to neutral Can sit to stand in [place] Neurological:     Mental Status: She is alert.     Motor: Weakness present.     Comments: Alert and oriented x 3. Normal insight and awareness. Follows commands, Intact Memory. Normal language and speech. Cranial nerve exam unremarkable for mild right central 7 still present. RUE 0/5 deltoids, 2+ to 3/5 biceps, triceps  and 4-5/5 wrist and fingers. RLE: 2+/5 HF/HAB and 1-2/5 elsewhere. DTR's 1+. Sensory exam normal for light touch and pain in all 4 limbs. Sensory exam normal for light touch and pain in all 4 limbs. No limb ataxia or cerebellar signs. No abnormal  tone appreciated.  Motor/sensory exam unchanged 6/11 No joint swelling noted   Assessment/Plan: 1. Functional deficits which require 3+ hours per day of interdisciplinary therapy in a comprehensive inpatient rehab setting. Physiatrist is providing close team supervision and 24 hour management of active medical problems listed below. Physiatrist and rehab team continue to assess barriers to discharge/monitor patient progress toward functional and medical goals  Care Tool:  Bathing    Body parts bathed by patient: Right arm, Chest, Left arm, Abdomen, Front perineal area, Right upper leg, Left upper leg, Right lower leg, Left lower leg, Face, Buttocks   Body parts bathed by helper: Buttocks     Bathing assist Assist Level: Set up assist     Upper Body Dressing/Undressing Upper body dressing   What is the patient wearing?: Pull over shirt    Upper body assist Assist Level: Set up assist    Lower Body Dressing/Undressing Lower body dressing      What is the patient wearing?: Pants     Lower body assist Assist for lower body dressing: Supervision/Verbal cueing (EOB)     Toileting Toileting    Toileting assist Assist for toileting: Dependent -  Patient 0% (stedy)     Transfers Chair/bed transfer  Transfers assist     Chair/bed transfer assist level: Contact Guard/Touching assist     Locomotion Ambulation   Ambulation assist   Ambulation activity did not occur: Safety/medical concerns (0/5 R LE MMT)  Assist level: 2 helpers Assistive device: Walker-rolling Max distance: >59f   Walk 10 feet activity   Assist  Walk 10 feet activity did not occur: Safety/medical concerns (0/5 R LE MMT)  Assist level: 2 helpers Assistive device: Walker-rolling   Walk 50 feet activity   Assist Walk 50 feet with 2 turns activity did not occur: Safety/medical concerns (0/5 R LE MMT)  Assist level: 2 helpers Assistive device: Walker-rolling    Walk 150 feet  activity   Assist Walk 150 feet activity did not occur: Safety/medical concerns (0/5 R LE MMT)  Assist level: 2 helpers Assistive device: Walker-rolling    Walk 10 feet on uneven surface  activity   Assist Walk 10 feet on uneven surfaces activity did not occur: Safety/medical concerns (0/5 R LE MMT)         Wheelchair     Assist Is the patient using a wheelchair?: Yes Type of Wheelchair: Manual Wheelchair activity did not occur: Safety/medical concerns (Per PT)  Wheelchair assist level: Independent Max wheelchair distance: >1511f   Wheelchair 50 feet with 2 turns activity    Assist        Assist Level: Independent   Wheelchair 150 feet activity     Assist      Assist Level: Independent   Blood pressure (!) 141/83, pulse 74, temperature 97.9 F (36.6 C), resp. rate 16, height '5\' 11"'$  (1.803 m), weight 130.4 kg, last menstrual period 06/08/2020, SpO2 100 %.    Medical Problem List and Plan: 1. Functional deficits secondary to left posterior frontal lobe metastatic breast cancer, s/p resection with right-sided weakness             -patient may shower             -ELOS/Goals: 14-20 days, min assist goals with PT/OT and mod I with SLP             -keep RUE supported given proximal muscle weakness.              -PRAFO to help support/stretch right heel cord. Consider night splint  .cocnirs   Provided with podcast recommendations to learn more about role of diet in cancer prevention/treatment  [facility] on [facility] options  Provided with Eat to Beat Disease book  Provided information regarding integrative oncologist   -Continue CIR therapies including PT, OT   -Estimated discharge on 12/08/21 2.  S/p craniotomy with impaired mobility -DVT/anticoagulation:  Mechanical:  Antiembolism stockings, knee (TED hose) Bilateral lower extremities Sequential compression devices,  below knee Bilateral lower extremities Transitioned to Eliquis as per NSGY recommendation. Discussed that she should continue this until follow-up with NSGY outpatient.  3. Postoperative pain: Tylenol, Decrease Norco to 1 tab q6H as needed. Provided with pain relief journal.  4. Mood: LCSW to evaluate and provide emotional support             -antipsychotic agents: n/a 5. Neuropsych: This patient is capable of making decisions on her own behalf. 6. Craniotomy incision site: staples removed  without complication. Site inspected and hearing well. 7. 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 Will see if can use BSC since will have been seen by therapy at that point.   5/29: 2 large BM's 5/27- con't regimen- use sorbitol prn  6/11 lbm 6/9 11: Sensitivity to [employer] laundry products; is not allergic to baking powder 12: Intermittent elevated BP: continue to monitor --discontinue clonidine -6/13 BP overall well controlled, continue to follow trend 13: Morbid obesity: BMI 43.97; dietary counseling  14. Insomnia: continue melatonin  15. Leukocytosis  5/27- WBC 15.9- but since last labs, have new Decadron 4 mg q6 hours- afebrile- no signs/Sx's of illness- will recheck Monday and monitor  5/29 WBC is 16.5- discussed with patient that this has been stable.   6/7 steroid wean discussed with NSGY  6/9 WBC 8.7 improved, continue to monitor 16. Hyperglycemia  5/28- started  sensitive SSI and CBGs since had BG of 207 on BMP yesterday.  5/29- discussed how decadron will affects cbg's. Hopefully with taper we'll see improvement. Sugars still high this morning  5/30: start metformin '500mg'$  daily.   5/31: increase metformin to '500mg'$  BID.   6/1: increase metformin to '850mg'$   6/3 please check with NSGY on Monday if it would be ok to taper steroids to '2mg'$  q8H.   6/5 adjusted her CBG schedule to start at 4am for fasting, called NSGY, left message with my information  6/6: decrease steroids to '2mg'$  q8H and will ask NSGY today weaning schedule for steroids 6/7: wean steroids to BID on 6/10  6/8: requested pharmacy to put in following steroid wean and discussed with patient: 6/12: '1mg'$  TID, 6/16: '1mg'$  BID, 6/20: '1mg'$  daily for 4 days then off. Discussed plan to stop metformin/CBGs/insulin tomorrow.   6/9: Stopped metformin per Kr Raulkars plan yesterday, will continue to follow  CBG  CBG (last 3)  No results for input(s): "GLUCAP" in the last 72 hours.   6/10 cbgs remain elevated despite steroid taper, may ultimately need to renew metformin or similar  6/12 dexomethasone down to '1mg'$  Q8h, should help with glucose 17. Rash on chest: hydrocortisone cream ordered, change to prn 18. Tachycardia: resolved, continue to monitor HR TID  -HR  in 70s this AM,improved, continue to monitor          LOS: 18 days A FACE TO FACE EVALUATION WAS PERFORMED  Jennye Boroughs 12/05/2021, 7:43 AM

## 2021-12-05 NOTE — Progress Notes (Signed)
Occupational Therapy Session Note  Patient Details  Name: Sheryl Mcmillan MRN: [id number] Date of Birth: May 12, 1976  Today's Date: 12/05/2021 OT Individual Time: 1005-1100 & 1415-1535 OT Individual Time Calculation (min): 55 min & 80 min   Short Term Goals: Week 2:  OT Short Term Goal 1 (Week 2): Pt will consistently transfer to toilet wiht MIN A and LRAD OT Short Term Goal 2 (Week 2): Pt will complete sit > stand in prep for ADL task with min A using LRAD OT Short Term Goal 3 (Week 2): Pt will donn LB clothing with AE PRN, with min A  Skilled Therapeutic Interventions/Progress Updates:  Session 1 Skilled OT intervention completed with focus on BUE strengthening with review of HEP issued to pt. Pt received seated on commode, reporting being finished with voiding. Completed toileting with CGA in stance with RW, then ambulatory transfer with CGA using RW from toilet to w/c. Pt self-propelled in w/c with hemi-technique with mod I <> gym. Seated in w/c, pt completed the following exercises to promote BUE strengthening needed for RUE proximal stability as well as overall functional use of BUE for ADL tasks:  (With red theraband) 2x15 reps Horizontal abduction Self-anchored shoulder flexion each arm, AROM only on RUE 2/2 limited ROM Self-anchored bicep flexion each arm Alternating chest presses Shoulder external rotation Shoulder extension each arm Shoulder diagonal pulls each arm, AROM only on RUE 2/2 limited ROM Therapist/bed rail anchored scapular retraction each arm Proximal stabilization on wall with small ball with 10 forward, then 10 backward circular motions  Modifications made to HEP to address AROM vs strength 2/2 limitations presented during exercises, as well as therapist annotating handout for improved form and technique when completing independently. Education provided about use of cane that pt has at home to use for AA/ROM modifications. Pt was left seated in w/c, in room, with  all needs in reach at end of session.  Session 2 Skilled OT intervention completed with focus on cardiovascular endurance, BUE unsupported dynamic balance, RUE AROM, R hand fine motor skills and meal prep. Pt received seated in w/c, no c/o pain, agreeable to session. Completed all transfers at the ambulatory level using RW with CGA to min A, with R GRAFO donned. Ambulated to bathroom for toileting, with pt continent of void only, then CGA for toileting management and ambulation back to w/c. Self-propelled in w/c > gym with hemi-technique and mod I.   Completed stand pivot to arm/leg bike, with min A needed for positioning RLE onto foot pedal. To promote cardiovascular endurance needed for ambulation within the home, pt completed 10 mins continuous pedaling on level 7. Min A needed to stand from seat, then ambulated to mat table.   To challenge dynamic balance with multi-level reaching tasks as well as AROM of RUE, pt stood at long mirror without AD with CGA and +2 on SBA for safety, while pt used RUE to copy scattered design with dry erase marker on high, low and sides of mirror. Education provided about bending at the hips vs knees 2/2 knee buckling and weakness, with pt demonstrating CGA +2 assist level for flexed >90 degrees at hip. No difficulty with drawing in R hand.   Ambulated from day room > ADL kitchen with +2 for w/c follow. To promote safety and independence with meal prep at home, while seated at table, pt cut up sausage, lemons, onions, and bell peppers that her daughter brought in from home. Pt utilized R hand to cut all items,  with cues needed occasionally for putting knife down when opening bags or multi-tasking to promote safety with sharp items. Pt was excited to prep meal for cooking session with OT tomorrow. All standard washing and cleaning protocols were followed.   Transported pt dependently in w/c for time back to room, where pt was left seated in w/c, with daughter in room and  all immediate needs met at end of session.    Therapy Documentation Precautions:  Precautions Precautions: Fall Restrictions Weight Bearing Restrictions: No    Therapy/Group: Individual Therapy  Blase Mess, MS, OTR/L  12/05/2021, 7:30 AM

## 2021-12-05 NOTE — Progress Notes (Signed)
Physical Therapy Session Note  Patient Details  Name: Sheryl Mcmillan MRN: [id number] Date of Birth: 12-23-1975  Today's Date: 12/05/2021 PT Individual Time: 0731-0828 PT Individual Time Calculation (min): 57 min   Short Term Goals: Week 1:  PT Short Term Goal 1 (Week 1): Pt will transfer bed<>chair with CGA PT Short Term Goal 1 - Progress (Week 1): Met PT Short Term Goal 2 (Week 1): Pt will complete sit to stand from chair height with MinA + RW PT Short Term Goal 2 - Progress (Week 1): Progressing toward goal PT Short Term Goal 3 (Week 1): Pt will complete amb of 10 ft with ModA + RW PT Short Term Goal 3 - Progress (Week 1): Progressing toward goal Week 2:  PT Short Term Goal 1 (Week 2): pt will transfer sit<>stand with LRAD and min A PT Short Term Goal 2 (Week 2): pt will transfer stand<>pivot with LRAD and mod A PT Short Term Goal 3 (Week 2): Pt will initate gait training with RW  Skilled Therapeutic Interventions/Progress Updates:   Received pt sitting EOB donning R AFO using shoe horn. Pt reported "getting into trouble" yesterday for showering by herself. Informed pt of safety and "call don't fall" policy and re-iterated that pt is not to move around room without staff assisting. Pt agreeable to PT treatment and denied any pain during session. Session with emphasis on functional mobility/transfers, generalized strengthening and endurance, dynamic standing balance/coordination, NMR, stair navigation, and gait training. Pt transferred bed<>WC stand<>pivot to R with CGA and sat in [facility] and brushed teeth/washed face with set up assist. Pt performed WC mobility 120f x 2 trials using R hemi technique and mod I to/from room. Pt performed all transfers with bariatric RW and CGA throughout session. Pt ambulated 5518fwith bariatric RW and CGA - no knee buckling and improvements in gait mechanics. Pt ambulated to staircase with bariatric RW and CGA and navigated 12 steps with 2 rails and CGA/min A  ascending and descending with a step to pattern. Pt able to recall "up with the good, down with the bad" technique. Pt stepped on/off [employer] with CGA/min A and worked on the following balance activities - of note, when stepping down, pt's RLE adducting heavily throwing her off balance, requiring mod A to correct and manual assist to safely place RLE: -weight shift training on level static with BUE support for 2 minutes -weight shift training on level 1 with BUE support for 1 minute and 37 seconds with heavy mod A for balance. Pt immediately buckled upon making floor unlevel and with difficulty placing RLE for appropriate BOS -limits of stability training x 3 trials on level static with BUE support and CGA/close supervision for 1 minute and 15 seconds, 49 seconds, and 44 seconds.  Pt ambulated additional [id number] bariatric RW and CGA/min A at end of session. Pt with increased R knee instability with fatigue and when distracted but demonstrates improvements in ability to recognize mistakes and correct them before therapist interjects. Concluded session with pt sitting [facility] all needs within reach.   Therapy Documentation Precautions:  Precautions Precautions: Fall Restrictions Weight Bearing Restrictions: No  Therapy/Group: Individual Therapy AnnAlfonse Alpers, DPT  12/05/2021, 6:50 AM

## 2021-12-06 ENCOUNTER — Ambulatory Visit: Payer: Medicaid Other | Admitting: Plastic Surgery

## 2021-12-06 NOTE — Progress Notes (Signed)
Inpatient Rehabilitation Care Coordinator Discharge Note   Patient Details  Name: Sheryl Mcmillan MRN: [id number] Date of Birth: 07/21/1975   Discharge location: Home  Length of Stay: 21 Days  Discharge activity level: Sup/cga  Home/community participation: daughter and friends  Patient response EN:MMHWKG Literacy - How often do you need to have someone help you when you read instructions, pamphlets, or other written material from your doctor or pharmacy?: Never  Patient response SU:PJSRPR Isolation - How often do you feel lonely or isolated from those around you?: Never  Services provided included: SW, Pharmacy, Neuropsych, TR, CM, RN, SLP, OT, PT, RD, MD  Financial Services:  Financial Services Utilized: Medicaid    Choices offered to/list presented to: patient  Follow-up services arranged:  Outpatient    Outpatient Servicies: at [facility]      Patient response to transportation need: Is the patient able to respond to transportation needs?: Yes In the past 12 months, has lack of transportation kept you from medical appointments or from getting medications?: No In the past 12 months, has lack of transportation kept you from meetings, work, or from getting things needed for daily living?: No    Comments (or additional information):  Patient/Family verbalized understanding of follow-up arrangements:  Yes  Individual responsible for coordination of the follow-up plan: Patient  Confirmed correct DME delivered: Dyanne Iha 12/06/2021    Dyanne Iha

## 2021-12-06 NOTE — Patient Care Conference (Signed)
Inpatient RehabilitationTeam Conference and Plan of Care Update Date: 12/06/2021   Time: 11:40 AM   Patient Name: Sheryl Mcmillan      Medical Record Number: [id number]  Date of Birth: Dec 07, 1975 Sex: Female         Room/Bed: 4W13C/4W13C-01 Payor Info: Payor: [place] / Plan: [place] MEDICAID [place] / Product Type: *No Product type* /    Admit Date/Time:  11/17/2021  4:04 PM  Primary Diagnosis:  Brain tumor [facility])  Hospital Problems: Principal Problem:   Brain tumor [facility])    Expected Discharge Date: Expected Discharge Date: 12/08/21  Team Members Present: Physician leading conference: Dr. Leeroy Cha Social Worker Present: Erlene Quan, BSW Nurse Present: Dorthula Nettles, RN PT Present: Becky Sax, PT OT Present: Jennefer Bravo, OT PPS Coordinator present : Gunnar Fusi, SLP     Current Status/Progress Goal Weekly Team Focus  Bowel/Bladder   Continent of B/B.  remain continent  Toilet as needed   Swallow/Nutrition/ Hydration             ADL's   Supervision bathing with LH sponge, mod I dressing at bed level, Min A toileting, min A stand pivot using RW toilet transfers  supervision  d/c planning, HEP, safety awareness, RUE NMR, IADLs   Mobility   bed mobility mod I, stand<>pivot transfers with bariatric RW CGA, gait >153f with bariatric RW min A, 8 steps 2 rails min A  supervision, CGA steps  safety awareness, functional mobility/transfers, generalized strengthening and endurance, dynamic standing balance/coordination, gait training, stair navigation, D/C planning, NMR.   Communication             Safety/Cognition/ Behavioral Observations            Pain   no c/o pain  <3/10  Assess Qshift and prn   Skin   Skin intact  Maintain skin integrity  Assess Qshift and prn     Discharge Planning:  Patient d/c home on Friday. DME ordered. EDU complete. OP referral sent   Team Discussion: Doing well medically. Medications  adjusted. Eliquis for DVT prophylaxis. Provided dietary education. Dietary education provided. Reports no pain. Incision CDI. Discharging home.   Patient on target to meet rehab goals: yes, supervision goals, CGA stairs. Currently using AE for bathing. CGA toileting, mod I bed mobility. Ambulating > 150 ft with min assist. Completed 8 steps with 2 rails.   *See Care Plan and progress notes for long and short-term goals.   Revisions to Treatment Plan:  Adjusting medications   Teaching Needs: Family education, medication management, skin/wound care, transfer/gait training, etc.   Current Barriers to Discharge: No current barriers  Possible Resolutions to Barriers: All barriers addressed     Medical Summary Current Status: incision is healing well, rash on chest improved, morbid obesity BMI 40.10, steroid induced hyperglycemia  Barriers to Discharge: Medical stability;Wound care  Barriers to Discharge Comments: s/p craniotomy, morbid obesity BMI 40.10, steroid induced hyperglycemia Possible Resolutions to [employer] continue daily wound care, decadron taper, tapered off metformin, ISS discontinued, provided dietary education   Continued Need for Acute Rehabilitation Level of Care: The patient requires daily medical management by a physician with specialized training in physical medicine and rehabilitation for the following reasons: Direction of a multidisciplinary physical rehabilitation program to maximize functional independence : Yes Medical management of patient stability for increased activity during participation in an intensive rehabilitation regime.: Yes Analysis of laboratory values and/or radiology reports with any subsequent  need for medication adjustment and/or medical intervention. : Yes   I attest that I was present, lead the team conference, and concur with the assessment and plan of the team.   Cristi Loron 12/06/2021, 3:18 PM

## 2021-12-06 NOTE — Progress Notes (Signed)
Occupational Therapy Session Note  Patient Details  Name: Sheryl Mcmillan MRN: [id number] Date of Birth: 17-Oct-1975  Today's Date: 12/06/2021 OT Individual Time: 1000-1128 & 1421-1501 OT Individual Time Calculation (min): 88 min & 40 min   Short Term Goals: Week 2:  OT Short Term Goal 1 (Week 2): Pt will consistently transfer to toilet wiht MIN A and LRAD OT Short Term Goal 2 (Week 2): Pt will complete sit > stand in prep for ADL task with min A using LRAD OT Short Term Goal 3 (Week 2): Pt will donn LB clothing with AE PRN, with min A  Skilled Therapeutic Interventions/Progress Updates:  Session 1 Skilled OT intervention completed with focus on high level meal prep tasks, dynamic balance, w/c mobility and RUE functional use. Pt received seated in w/c, no c/o pain. Pt self-propelled in w/c with hemi-technique to ADL kitchen with mod I. Overall pt cooked with supervision to prepare sausage tortellini alfredo and baked salmon. While at a w/c level only, with use of varying services including table top, counter/stove top level, pt completed the following to address meal prep requirements upon d/c:  Opening containers/packages Using stove top features including handles and burners Use of oven for baking Sink for hand washing Use of fridge/fridge door  The following cues needed for safety awareness: -Reaching into hot oven, with suggestion of using pot holders that go to the forearm as physical assist was needed to prevent pt from touching sides of oven during food placement -Having assist for heavy pots vs lifting at w/c level -Using cutting board for cutting items vs using R hand over pot to drop in 2/2 sensory and motor weaknesses in affected hand -Precaution when reaching while at w/c level to back row of stovetop -Multi-tasking and alternating attention requirements for cooking higher level meal with use of all 4 burners and oven use, with suggestion of preparing adequate time at home for  meals vs time constraint -Carrying items and w/c mobility, with avoiding holding hot items in one hand vs using sliding along counter method  Pt was left seated in w/c, with other OT staff member/rehab tech to supervise and assist pt back to room dependently in w/c 2/2 time constraint.  Session 2 Skilled OT intervention completed with focus on d/c planning, family education, ADL retraining. Pt received seated in w/c, no c/o pain, agreeable to session. Pt's daughter assisted pt with ambulatory transfers from w/c > tub bench with initial supervision however progressing to CGA once safety cues were provided by therapist. Pt's daughter assisted in doffing bilateral socks, and RLE GRAFO. Bathing completed with set up A. Donned GRAFO with mod A for time, then sit > stand and ambulatory transfer using RW with CGA provided from therapist to EOB. Pt preferring not to donn clothing 2/2 last therapy session and prefers staying nude when not leaving room therefore clothes left off. Rest of session spent discussing reminders for pacing herself, slowing down, reinforcing CGA level for all transfers provided by her daughter with pt receptive and admittedly reporting knowing she needs to work on that and is appreciative of reminders and continued education. Therapist did clear daughter to assist pt as of this date for toileting and showers at the [facility] level for short ambulatory transfers only. Pt was left upright in bed, with bed alarm on and all needs in reach at end of session.   Therapy Documentation Precautions:  Precautions Precautions: Fall Precaution Comments: R knee instability Restrictions Weight Bearing Restrictions: No Other  Position/Activity Restrictions: GRAFO on RLE for ambulation/transfers    Therapy/Group: Individual Therapy  Blase Mess, MS, OTR/L  12/06/2021, 7:38 AM

## 2021-12-06 NOTE — Progress Notes (Signed)
Patient Care Team: Windell Hummingbird, PA-C as PCP - General (Physician Assistant) [name], RN as Oncology Nurse Navigator [name], RN as Oncology Nurse Navigator [name], MD as Consulting Physician (General Surgery) Magrinat, [name], MD (Inactive) as Consulting Physician (Oncology) [name], MD as Consulting Physician (Radiation Oncology) [name], MD as Consulting Physician (Plastic Surgery)  DIAGNOSIS:  Encounter Diagnoses  Name Primary?   Metastatic cancer to brain [facility]) Yes   Malignant neoplasm of upper-outer quadrant of left breast in female, estrogen receptor positive ([place])     SUMMARY OF ONCOLOGIC HISTORY: Oncology History  Malignant neoplasm of upper-outer quadrant of left breast in female, estrogen receptor positive ([place])  05/26/2020 Initial Diagnosis   Malignant neoplasm of upper-outer quadrant of left breast in female, estrogen receptor positive ([place])   06/16/2020 - 09/30/2020 Chemotherapy      Patient is on Antibody Plan: BREAST TRASTUZUMAB Q21D    06/16/2020 Genetic Testing   Positive genetic testing: 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  Variant of uncertain significance in MSH3 at c.2724A>G (Silent).  No other pathogenic or uncertain variants were reported in the [facility] Multi-Cancer Panel.  The report date is June 16, 2020.    The variant of uncertain significance (VUS) in MSH3 at c.2724A>G (Silent) has been reclassified to likely benign.  The change in variant classification was made as a result of re-review of evidence in light of new variant interpretation guidelines and/or new information. The amended report date is January 10, 2021.   The Multi-Cancer Panel offered by [facility] includes sequencing and/or deletion duplication testing of the following 85 genes: AIP, ALK, APC, ATM, AXIN2,BAP1,  BARD1, BLM, BMPR1A, BRCA1, BRCA2, BRIP1, CASR, CDC73, CDH1, CDK4, CDKN1B, CDKN1C, CDKN2A (p14ARF), CDKN2A  (p16INK4a), CEBPA, CHEK2, CTNNA1, DICER1, DIS3L2, EGFR (c.2369C>T, p.Thr790Met variant only), EPCAM (Deletion/duplication testing only), FH, FLCN, GATA2, GPC3, GREM1 (Promoter region deletion/duplication testing only), HOXB13 (c.251G>A, p.Gly84Glu), HRAS, KIT, MAX, MEN1, MET, MITF (c.952G>A, p.Glu318Lys variant only), MLH1, MSH2, MSH3, MSH6, MUTYH, NBN, NF1, NF2, NTHL1, PALB2, PDGFRA, PHOX2B, PMS2, POLD1, POLE, POT1, PRKAR1A, PTCH1, PTEN, RAD50, RAD51C, RAD51D, RB1, RECQL4, RET, RNF43, RUNX1, SDHAF2, SDHA (sequence changes only), SDHB, SDHC, SDHD, SMAD4, SMARCA4, SMARCB1, SMARCE1, STK11, SUFU, TERC, TERT, TMEM127, TP53, TSC1, TSC2, VHL, WRN and WT1.    10/20/2020 - 10/20/2020 Chemotherapy    Patient is on Treatment Plan: BREAST  DOCETAXEL + CARBOPLATIN + TRASTUZUMAB + PERTUZUMAB  (TCHP) Q21D       10/20/2020 - 06/23/2021 Chemotherapy   Patient is on Treatment Plan : BREAST Trastuzumab q21d       CHIEF COMPLIANT: Metastatic breast cancer follow-up hospitalization and Establish oncology care with Dr. Lindi Adie    INTERVAL HISTORY: Sheryl Mcmillan is a 46 y.o with the above mention estrogen receptor negative, Her2 positive breast cancer (s/p bilateral mastectomies); BRCA1+. She presents to the clinic for a follow-up and establish care with Dr. Lindi Adie. States that she was concern about what the metastatic disease was. States that she doesn't like taking medications. States that she lost 30 pounds when she was in the hospital. States that she wasn't eating the fist week. States from the knee down she cant squeeze toes or move ankle. States that she does go to physical therapy. States that the left leg is ok.     ALLERGIES:  is allergic to carboplatin, other, and wound dressing adhesive.  MEDICATIONS:  Current Outpatient Medications  Medication Sig Dispense Refill   acetaminophen (TYLENOL) 325 MG tablet Take  1-2 tablets (325-650 mg total) by mouth every 4 (four) hours as needed for mild pain.      methocarbamol (ROBAXIN) 500 MG tablet Take 1 tablet (500 mg total) by mouth every 6 (six) hours as needed for muscle spasms. 60 tablet 0   Multiple Vitamins-Minerals (ONE-A-DAY WOMENS PO) Take 1 tablet by mouth daily.     omeprazole (PRILOSEC) 20 MG capsule Take 40 mg by mouth daily as needed.     No current facility-administered medications for this visit.    PHYSICAL EXAMINATION: ECOG PERFORMANCE STATUS: 1 - Symptomatic but completely ambulatory  Vitals:   12/20/21 0901  BP: 130/67  Pulse: (!) 104  Resp: 19  Temp: 97.7 F (36.5 C)  SpO2: 99%   Filed Weights   12/20/21 0901  Weight: 296 lb 6.4 oz (134.4 kg)      LABORATORY DATA:  I have reviewed the data as listed    Latest Ref Rng & Units 12/08/2021    5:06 AM 12/01/2021    6:21 AM 11/24/2021    5:59 AM  CMP  Glucose 70 - 99 mg/dL 226  224  217   BUN 6 - 20 mg/dL _0 Creatinine 0.44 - 1.00 mg/dL 0.64  0.76  0.72   Sodium 135 - 145 mmol/L 135  137  134   Potassium 3.5 - 5.1 mmol/L 4.6  3.6  4.0   Chloride 98 - 111 mmol/L 104  105  104   CO2 22 - 32 mmol/L _1 Calcium 8.9 - 10.3 mg/dL 8.4  8.9  8.8     Lab Results  Component Value Date   WBC 7.2 12/08/2021   HGB 13.3 12/08/2021   HCT 40.0 12/08/2021   MCV 85.7 12/08/2021   PLT 162 12/08/2021   NEUTROABS 13.2 (H) 11/20/2021    ASSESSMENT & PLAN:  Malignant neoplasm of upper-outer quadrant of left breast in female, estrogen receptor positive ([place]) 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, PR negative, Ki-67 50%, HER2 positive, genetics: BRCA1 mutation 06/16/2020: Neoadjuvant chemotherapy with TCH Perjeta but Perjeta was discontinued after cycle 1, Herceptin maintenance completed December 2022 11/07/2020: Bilateral mastectomies: Residual grade 3 IDC left breast negative margins, triple negative with a Ki-67 of 15% 03/14/2021: Bilateral salpingo-oophorectomy  Hospitalization 11/17/2021-12/09/2021 partial seizures, brain MRI left  frontal metastases resection of the tumor metastatic breast cancer HER2 positive: Right lower extremity weakness CT CAP 11/12/2021: No distant metastatic disease.  Discussion: I do not recommend any further systemic treatment based on no evidence of disease at this time. Significant weight loss during hospitalization Right leg weakness: Working with physical therapy   Recommendation: PET CT scan in November and follow-up after that to discuss results.    Orders Placed This Encounter  Procedures   NM PET Image Restag (PS) Skull Base To Thigh    Standing Status:   Future    Standing Expiration Date:   12/20/2022    Order Specific Question:   If indicated for the ordered procedure, I authorize the administration of a radiopharmaceutical per Radiology protocol    Answer:   Yes    Order Specific Question:   Is the patient pregnant?    Answer:   No    Order Specific Question:   Preferred imaging location?    Answer:   [facility]    Order Specific Question:   Release to patient    Answer:  Immediate   The patient has a good understanding of the overall plan. she agrees with it. she will call with any problems that may develop before the next visit here. Total time spent: 30 mins including face to face time and time spent for planning, charting and co-ordination of care   Harriette Ohara, MD 12/20/21    I Gardiner Coins am scribing for Dr. Lindi Adie  I have reviewed the above documentation for accuracy and completeness, and I agree with the above.

## 2021-12-06 NOTE — Progress Notes (Signed)
Physical Therapy Discharge Summary  Patient Details  Name: Sheryl Mcmillan MRN: [id number] Date of Birth: 06/04/1976  Patient has met 6 of 6 long term goals due to improved activity tolerance, improved balance, improved postural control, increased strength, increased range of motion, ability to compensate for deficits, functional use of  right upper extremity and right lower extremity, improved attention, improved awareness, and improved coordination. Patient to discharge at an ambulatory level  CGA using bariatric RW . Patient's care partner is independent to provide the necessary physical assistance at discharge. Pt's daughter did not participate in formal family education training (due to pt declining family education), however daughter was briefly present during sessions and daughter/pt verbalized confidence with all tasks to ensure safe discharge home.   All goals met  Recommendation:  Patient will benefit from ongoing skilled PT services in outpatient setting to continue to advance safe functional mobility, address ongoing impairments in transfers, generalized strengthening and endurance, NMR, gait training, and to minimize fall risk.  Equipment: Bariatric RW, 22x18 manual WC with standard legrests  Reasons for discharge: treatment goals met and discharge from hospital  Patient/family agrees with progress made and goals achieved: Yes  PT Discharge Precautions/Restrictions Precautions Precautions: Fall Precaution Comments: R knee instability Restrictions Weight Bearing Restrictions: No Other Position/Activity Restrictions: GRAFO on RLE for ambulation/transfers Pain Interference Pain Interference Pain Effect on Sleep: 1. Rarely or not at all Pain Interference with Therapy Activities: 1. Rarely or not at all Pain Interference with Day-to-Day Activities: 1. Rarely or not at all Cognition Overall Cognitive Status: Within Functional Limits for tasks assessed Arousal/Alertness:  Awake/alert Orientation Level: Oriented X4 Memory: Appears intact Awareness: Impaired Problem Solving: Appears intact Safety/Judgment: Impaired Comments: slightly impulsive and decreased insight into deficits Sensation Sensation Light Touch: Appears Intact Proprioception: Appears Intact Additional Comments: pt denied any numbness/tingling/burning in either LE but reports she has regained sensation in R foot Coordination Gross Motor Movements are Fluid and Coordinated: No Fine Motor Movements are Fluid and Coordinated: No Coordination and Movement Description: R hemiparesis LE>UE, generalized weakness/deconditioning, and decreased balance/postural control. Finger Nose Finger Test: slightly slower on RUE Heel Shin Test: WFL on LLE and decreased ROM and coordination on RLE Motor  Motor Motor: Hemiplegia Motor - Skilled Clinical Observations: R hemiparesis LE>UE, generalized weakness/deconditioning, and decreased balance/postural control.  Mobility Bed Mobility Bed Mobility: Rolling Right;Rolling Left;Sit to Supine;Supine to Sit Rolling Right: Independent with assistive device Rolling Left: Independent with assistive device Supine to Sit: Independent with assistive device Sit to Supine: Independent with assistive device Transfers Transfers: Sit to Stand;Stand to Sit;Stand Pivot Transfers Sit to Stand: Supervision/Verbal cueing Stand to Sit: Supervision/Verbal cueing Stand Pivot Transfers: Supervision/Verbal cueing Stand Pivot Transfer Details: Verbal cues for technique Stand Pivot Transfer Details (indicate cue type and reason): verbal cues to back RLE up prior to sitting Transfer (Assistive device): Rolling walker (bariatric) Locomotion  Gait Ambulation: Yes Gait Assistance: Contact Guard/Touching assist Gait Distance (Feet): 500 Feet Assistive device: Rolling walker (bariatric) Gait Gait: Yes Gait Pattern: Impaired Gait Pattern: Step-through pattern;Decreased step length -  right;Decreased dorsiflexion - right;Right genu recurvatum;Trendelenburg;Narrow base of support;Poor foot clearance - right;Poor foot clearance - left Gait velocity: WFL Stairs / Additional Locomotion Stairs: Yes Stairs Assistance: Contact Guard/Touching assist Stair Management Technique: Two rails Number of Stairs: 12 Height of Stairs: 6 Ramp: Contact Guard/touching assist (bariatric RW) Wheelchair Mobility Wheelchair Mobility: Yes Wheelchair Assistance: Independent with assistive device Wheelchair Propulsion: Left upper extremity;Left lower extremity;Right upper extremity Wheelchair Parts Management: Independent Distance: >  198f  Trunk/Postural Assessment  Cervical Assessment Cervical Assessment: Within Functional Limits Thoracic Assessment Thoracic Assessment: Within Functional Limits Lumbar Assessment Lumbar Assessment: Exceptions to [facility](anterior pelvic tilt due to body habitus) Postural Control Postural Control: Deficits on evaluation Protective Responses: delayed, especially when distracted  Balance Balance Balance Assessed: Yes Static Sitting Balance Static Sitting - Balance Support: Feet supported;No upper extremity supported Static Sitting - Level of Assistance: 7: Independent Dynamic Sitting Balance Dynamic Sitting - Balance Support: Feet supported;No upper extremity supported Dynamic Sitting - Level of Assistance: 7: Independent Static Standing Balance Static Standing - Balance Support: Bilateral upper extremity supported;During functional activity (bariatric RW) Static Standing - Level of Assistance: 5: Stand by assistance (supervision) Dynamic Standing Balance Dynamic Standing - Balance Support: Bilateral upper extremity supported;During functional activity (bariatric RW) Dynamic Standing - Level of Assistance: 5: Stand by assistance (supervision) Extremity Assessment  RLE Assessment RLE Assessment: Exceptions to [facility] Strength Right Hip Flexion:  2+/5 Right Hip ABduction: 2+/5 Right Hip ADduction: 3-/5 Right Knee Flexion: 1/5 Right Knee Extension: 2-/5 Right Ankle Dorsiflexion: 0/5 Right Ankle Plantar Flexion: 0/5 LLE Assessment LLE Assessment: Exceptions to [facility] Strength Left Hip Flexion: 4-/5 Left Hip Extension: 4-/5 Left Hip ABduction: 4-/5 Left Hip ADduction: 4-/5 Left Knee Flexion: 4-/5 Left Knee Extension: 4/5 Left Ankle Dorsiflexion: 4/5 Left Ankle Plantar Flexion: 4/5  Evamae Rowen M JLyn HollingsheadPT, DPT  12/06/2021, 6:55 AM

## 2021-12-06 NOTE — Progress Notes (Signed)
PROGRESS NOTE   Subjective/Complaints: No new complaints this morning Has some nutrition questions Asks about medications and follow-up appointments upon discharge  ROS: No fevers, chills, SOB, HA or CP, +intermittent lower extremity pain   Objective:   No results found. No results for input(s): "WBC", "HGB", "HCT", "PLT" in the last 72 hours.    No results for input(s): "NA", "K", "CL", "CO2", "GLUCOSE", "BUN", "CREATININE", "CALCIUM" in the last 72 hours.     Intake/Output Summary (Last 24 hours) at 12/06/2021 0925 Last data filed at 12/06/2021 0745 Gross per 24 hour  Intake 454 ml  Output --  Net 454 ml        Physical Exam: Vital Signs Blood pressure 140/83, pulse 85, temperature 98.5 F (36.9 C), temperature source Oral, resp. rate 16, height '5\' 11"'$  (1.803 m), weight 130.4 kg, last menstrual period 06/08/2020, SpO2 99 %.   Constitutional: No distress . Vital signs reviewed. Working in gym with therapy HEENT: NCAT, EOMI, oral membranes moist, conjugate gaze Neck: supple Cardiovascular: RRR without murmur. No JVD    Respiratory/Chest: CTA Bilaterally without wheezes or rales. Normal effort    GI/Abdomen: BS +, non-tender, non-distended Ext: no clubbing, cyanosis, or edema Psych: pleasant and cooperative  Skin: incision CDI on scalp, warm and dry Musculoskeletal:        General: No swelling.     Comments: Right heel cord remains tight but can be stretched to neutral Can sit to stand in [place] Right knee buckling Neurological:     Mental Status: She is alert.     Motor: Weakness present.     Comments: Alert and oriented x 3. Normal insight and awareness. Intact Memory. Normal language and speech. Cranial nerve exam unremarkable for mild right central 7 still present. RUE 0/5 deltoids, 2+ to 3/5 biceps, triceps  and 4-5/5 wrist and fingers. RLE: 2+/5 HF/HAB and 1-2/5 elsewhere. DTR's 1+. Sensory exam normal  for light touch and pain in all 4 limbs. Sensory exam normal for light touch and pain in all 4 limbs. No limb ataxia or cerebellar signs. No abnormal tone appreciated.  Motor/sensory exam unchanged 6/11    Assessment/Plan: 1. Functional deficits which require 3+ hours per day of interdisciplinary therapy in a comprehensive inpatient rehab setting. Physiatrist is providing close team supervision and 24 hour management of active medical problems listed below. Physiatrist and rehab team continue to assess barriers to discharge/monitor patient progress toward functional and medical goals  Care Tool:  Bathing    Body parts bathed by patient: Right arm, Chest, Left arm, Abdomen, Front perineal area, Right upper leg, Left upper leg, Right lower leg, Left lower leg, Face, Buttocks   Body parts bathed by helper: Buttocks     Bathing assist Assist Level: Set up assist     Upper Body Dressing/Undressing Upper body dressing   What is the patient wearing?: Pull over shirt    Upper body assist Assist Level: Set up assist    Lower Body Dressing/Undressing Lower body dressing      What is the patient wearing?: Pants     Lower body assist Assist for lower body dressing: Supervision/Verbal cueing (EOB)  Toileting Toileting    Toileting assist Assist for toileting: Contact Guard/Touching assist     Transfers Chair/bed transfer  Transfers assist     Chair/bed transfer assist level: Contact Guard/Touching assist     Locomotion Ambulation   Ambulation assist   Ambulation activity did not occur: Safety/medical concerns (0/5 R LE MMT)  Assist level: Contact Guard/Touching assist Assistive device: Walker-rolling Max distance: >570f   Walk 10 feet activity   Assist  Walk 10 feet activity did not occur: Safety/medical concerns (0/5 R LE MMT)  Assist level: Contact Guard/Touching assist Assistive device: Walker-rolling   Walk 50 feet activity   Assist Walk 50 feet  with 2 turns activity did not occur: Safety/medical concerns (0/5 R LE MMT)  Assist level: Contact Guard/Touching assist Assistive device: Walker-rolling    Walk 150 feet activity   Assist Walk 150 feet activity did not occur: Safety/medical concerns (0/5 R LE MMT)  Assist level: Contact Guard/Touching assist Assistive device: Walker-rolling    Walk 10 feet on uneven surface  activity   Assist Walk 10 feet on uneven surfaces activity did not occur: Safety/medical concerns (0/5 R LE MMT)   Assist level: Contact Guard/Touching assist Assistive device: Walker-rolling   Wheelchair     Assist Is the patient using a wheelchair?: Yes Type of Wheelchair: Manual Wheelchair activity did not occur: Safety/medical concerns (Per PT)  Wheelchair assist level: Independent Max wheelchair distance: >1575f   Wheelchair 50 feet with 2 turns activity    Assist        Assist Level: Independent   Wheelchair 150 feet activity     Assist      Assist Level: Independent   Blood pressure 140/83, pulse 85, temperature 98.5 F (36.9 C), temperature source Oral, resp. rate 16, height '5\' 11"'$  (1.803 m), weight 130.4 kg, last menstrual period 06/08/2020, SpO2 99 %.    Medical Problem List and Plan: 1. Functional deficits secondary to left posterior frontal lobe metastatic breast cancer, s/p resection with right-sided weakness             -patient may shower             -ELOS/Goals: 14-20 days, min assist goals with PT/OT and mod I with SLP             -keep RUE supported given proximal muscle weakness.              -PRAFO to help support/stretch right heel cord. Consider night splint  Provided with podcast recommendations to learn more about role of diet in cancer prevention/treatment  [facility] on [facility]  Provided with Eat to Beat Disease book  Provided information regarding  integrative oncologist  -Continue CIR therapies including PT, OT   -Continue work on coordination and balance 2.  S/p craniotomy with impaired mobility -DVT/anticoagulation:  Mechanical:  Antiembolism stockings, knee (TED hose) Bilateral lower extremities Sequential compression devices, below knee Bilateral lower extremities Continue Eliquis as per NSGY recommendation. Discussed that she should continue this until follow-up with NSGY outpatient.  3. Postoperative pain: Tylenol, Decrease Norco to 1 tab q6H as needed- discussed sending 1 week supply of Norco on discharge. Provided with pain relief journal.  4. Mood: LCSW to evaluate and provide emotional support             -antipsychotic agents: n/a 5. Neuropsych: This patient is capable of making decisions on her own  behalf. 6. Craniotomy incision site: staples removed  without complication. Site inspected and hearing well. 7. 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 Will see if can use BSC since will have been seen by therapy at that point.   5/29: 2 large BM's 5/27- con't regimen- use sorbitol prn  6/11 lbm 6/9 11: Sensitivity to [employer] laundry products; is not allergic to baking powder 12: Intermittent elevated BP: continue to monitor --discontinue clonidine -6/12 BP well controlled, monitor 13: Morbid obesity: BMI 43.97; dietary counseling  14. Insomnia: continue melatonin  15. Leukocytosis  5/27- WBC 15.9- but since last labs,  have new Decadron 4 mg q6 hours- afebrile- no signs/Sx's of illness- will recheck Monday and monitor  5/29 WBC is 16.5- discussed with patient that this has been stable.   6/7 steroid wean discussed with NSGY  6/9 WBC 8.7 improved, continue to monitor 16. Hyperglycemia  5/28- started sensitive SSI and CBGs since had BG of 207 on BMP yesterday.  5/29- discussed how decadron will affects cbg's. Hopefully with taper we'll see improvement. Sugars still high this morning  5/30: start metformin '500mg'$  daily.   5/31: increase metformin to '500mg'$  BID.   6/1: increase metformin to '850mg'$   6/3 please check with NSGY on Monday if it would be ok to taper steroids to '2mg'$  q8H.   6/5 adjusted her CBG schedule to start at 4am for fasting, called NSGY, left message with my information  6/6: decrease steroids to '2mg'$  q8H and will ask NSGY today weaning schedule for steroids 6/7: wean steroids to BID on 6/10  6/8: requested pharmacy to put in following steroid wean and discussed with patient: 6/12: '1mg'$  TID, 6/16: '1mg'$  BID, 6/20: '1mg'$  daily for 4 days then off. Discussed plan to stop metformin/CBGs/insulin tomorrow.   6/9: Stopped metformin per Kr Raulkars plan yesterday, will continue to follow  CBG  CBG (last 3)  No results for input(s): "GLUCAP" in the last 72 hours.   6/10 cbgs remain elevated despite steroid taper, may ultimately need to renew metformin or similar  6/12 dexomethasone down to '1mg'$  Q8h, should help with glucose 17. Rash on chest: hydrocortisone cream ordered, change to prn 18. Tachycardia: resolved, continue to monitor HR TID  -HR has been stable in 70s and 80s mostly, follow          LOS: 19 days A FACE TO FACE EVALUATION WAS PERFORMED  Ebonye Reade P Elica Almas 12/06/2021, 9:25 AM

## 2021-12-06 NOTE — Progress Notes (Signed)
Physical Therapy Session Note  Patient Details  Name: Sheryl Mcmillan MRN: [id number] Date of Birth: [birth year]  Today's Date: 12/06/2021 PT Individual Time: 0712-1975 PT Individual Time Calculation (min): 71 min   Short Term Goals: Week 1:  PT Short Term Goal 1 (Week 1): Pt will transfer bed<>chair with CGA PT Short Term Goal 1 - Progress (Week 1): Met PT Short Term Goal 2 (Week 1): Pt will complete sit to stand from chair height with MinA + RW PT Short Term Goal 2 - Progress (Week 1): Progressing toward goal PT Short Term Goal 3 (Week 1): Pt will complete amb of 10 ft with ModA + RW PT Short Term Goal 3 - Progress (Week 1): Progressing toward goal Week 2:  PT Short Term Goal 1 (Week 2): pt will transfer sit<>stand with LRAD and min A PT Short Term Goal 2 (Week 2): pt will transfer stand<>pivot with LRAD and mod A PT Short Term Goal 3 (Week 2): Pt will initate gait training with RW  Skilled Therapeutic Interventions/Progress Updates:   Received pt semi-reclined in bed with RN present at bedside. Pt agreeable to PT treatment and reported slight pain/cramping in LLE possibly from over-working in therapy yesterday, and fatigue in RLE (premedicated). Session with emphasis on discharge planning, functional mobility/transfers, generalized strengthening and endurance, dynamic standing balance/coordination, and gait training. Went through pain interference questionnaire, sensation, and MMT. Pt transferred semi-reclined<>sitting EOB with HOB elevated mod I and donned socks and shoes (including R GRAFO) with set up assist and increased time. Pt transferred bed<>WC stand<>pivot without AD and CGA. Pt sat in [facility] and brushed teeth/washed face at sink mod I. Pt performed WC mobility 126f x 2 trials using R hemi technique and mod I to/from room. Pt performed all transfers with bariatric RW and CGA throughout session. Pt ambulated 1567fx 2 trials with bariatric RW and CGA to/from ortho gym. Pt performed  ambulatory simulated car transfer with RW and close supervision and required UEs to assist RLE in/out of car. Pt then ambulated 1014fn uneven surface (ramp) with bariatric RW and CGA. Provided pt with walker bag and pt ambulated additional [id number] bariatric RW and CGA - pt with increased R toe catching and hitting RLE against RW. Ambulated to/from // bars and performed lateral side stepping x 6 laps with BUE support and CGA with emphasis on hip abductor strengthening and control/placement of RLE. Transitioned to forward tandem walking inside // bars x7 laps with BUE support and CGA - emphasis on placement of RLE. Attempted tandem walking backwards, however pt does not have enough hip extensor strength at this point. Con[facility] reach.   Therapy Documentation Precautions:  Precautions Precautions: Fall Precaution Comments: R knee buckling Restrictions Weight Bearing Restrictions: No Other Position/Activity Restrictions: GRAFO on RLE for ambulation/transfers  Therapy/Group: Individual Therapy AnnaAlfonse Alpers DPT  12/06/2021, 6:49 AM

## 2021-12-07 ENCOUNTER — Other Ambulatory Visit (HOSPITAL_COMMUNITY): Payer: Self-pay

## 2021-12-07 MED ORDER — DEXAMETHASONE 2 MG PO TABS
ORAL_TABLET | ORAL | 0 refills | Status: DC
  Filled 2021-12-07: qty 6, 8d supply, fill #0

## 2021-12-07 MED ORDER — CLONIDINE HCL 0.1 MG PO TABS
0.1000 mg | ORAL_TABLET | Freq: Two times a day (BID) | ORAL | 0 refills | Status: DC
  Filled 2021-12-07: qty 60, 30d supply, fill #0

## 2021-12-07 MED ORDER — HYDROCODONE-ACETAMINOPHEN 5-325 MG PO TABS
1.0000 | ORAL_TABLET | Freq: Three times a day (TID) | ORAL | 0 refills | Status: DC | PRN
  Filled 2021-12-07: qty 15, 5d supply, fill #0

## 2021-12-07 MED ORDER — RIVAROXABAN 10 MG PO TABS
10.0000 mg | ORAL_TABLET | Freq: Every day | ORAL | 0 refills | Status: DC
  Filled 2021-12-07: qty 19, 19d supply, fill #0

## 2021-12-07 MED ORDER — METHOCARBAMOL 500 MG PO TABS
500.0000 mg | ORAL_TABLET | Freq: Four times a day (QID) | ORAL | 0 refills | Status: DC | PRN
  Filled 2021-12-07: qty 60, 15d supply, fill #0

## 2021-12-07 NOTE — Progress Notes (Signed)
Occupational Therapy Session Note  Patient Details  Name: Sheryl Mcmillan MRN: [id number] Date of Birth: [birth year]  Today's Date: 12/07/2021 OT Individual Time: 1914-7829 OT Individual Time Calculation (min): 75 min    Short Term Goals: Week 2:  OT Short Term Goal 1 (Week 2): Pt will consistently transfer to toilet wiht MIN A and LRAD OT Short Term Goal 2 (Week 2): Pt will complete sit > stand in prep for ADL task with min A using LRAD OT Short Term Goal 3 (Week 2): Pt will donn LB clothing with AE PRN, with min A  Skilled Therapeutic Interventions/Progress Updates:    AM Session:  Patient received sitting on edge of bed finishing dressing.  Transferred to wheelchair with supervision and down to gym for Neuromuscular reeducation.  Worked to improve Right hip alignment, then activation as related to safe sit to stand, stand and stand to sit transitions.  Patient did well with visual cue to maintain midline during these functional movements.  Patient required light cueing and facilitation to sustain activation in RLE, e.g. as task complexity increased patient had less focus on RLE activation, although with practice showed improvement.  Patient showing significantly improved control and coordination of hip/trunk/knee and midline orientation with descent from standing.  Worked on Office manager of overhead reach repetitively.  Also addressed more fine motor unilateral and bimanual tasks in standing - working on controlled squat, and rotation.       PM Session:  Patient received up in wheelchair eager to shower.  Patient very eager to show improved safety and independence with toilet transfer and toileting.  Raised BSC to 22inches to allow greater independence and safety with sit to stand transition form commode.  Patient abel to complete multiple tranfers (as well as toilet hygiene) with supervision.  Ambulated to shower seat and showered with supervision.  Patient able to dress herself including AFO and  shoes.  Took patient off the unit and "went shopping" at benefit sale in [place].  Flipped armrests f wheelchair around to allow easier push up from chair bilaterally.  Patient able to walk through aisles, around people, around tables.  Patient able to "shop,"  talk with other customers, walk and change direction without loss of balance (contact guard provided as safety measure.)     Therapy Documentation Precautions:  Precautions Precautions: Fall Precaution Comments: R knee instability Restrictions Weight Bearing Restrictions: No Other Position/Activity Restrictions: GRAFO on RLE for ambulation/transfers  Pain:  No pain    Therapy/Group: Individual Therapy  Mariah Milling 12/07/2021, 3:10 PM

## 2021-12-07 NOTE — Progress Notes (Signed)
PROGRESS NOTE   Subjective/Complaints: No new complaints this morning Working with therapy Walking well! Minimal knee buckling  ROS: Denies fevers, chills, SOB, HA or CP, +intermittent lower extremity pain   Objective:   No results found. No results for input(s): "WBC", "HGB", "HCT", "PLT" in the last 72 hours.    No results for input(s): "NA", "K", "CL", "CO2", "GLUCOSE", "BUN", "CREATININE", "CALCIUM" in the last 72 hours.     Intake/Output Summary (Last 24 hours) at 12/07/2021 1029 Last data filed at 12/07/2021 0700 Gross per 24 hour  Intake 474 ml  Output --  Net 474 ml        Physical Exam: Vital Signs Blood pressure (!) 149/80, pulse 68, temperature 98.3 F (36.8 C), resp. rate 16, height '5\' 11"'$  (1.803 m), weight 130.4 kg, last menstrual period 06/08/2020, SpO2 98 %.   Constitutional: No distress . Vital signs reviewed. Working in gym with therapy HEENT: NCAT, EOMI, oral membranes moist, conjugate gaze Neck: supple Cardiovascular: RRR without murmur. No JVD    Respiratory/Chest: CTA Bilaterally without wheezes or rales. Normal effort    GI/Abdomen: BS +, non-tender, non-distended Ext: no clubbing, cyanosis, or edema Psych: pleasant and cooperative  Skin: incision CDI on scalp, warm and dry Musculoskeletal:        General: No swelling.     Comments: Right heel cord remains tight but can be stretched to neutral Can sit to stand in [place] Ambulating RW CG Right knee buckling Neurological:     Mental Status: She is alert.     Motor: Weakness present.     Comments: Alert and oriented x 3. Normal insight and awareness. Intact Memory. Normal language and speech. Cranial nerve exam unremarkable for mild right central 7 still present. RUE 0/5 deltoids, 2+ to 3/5 biceps, triceps  and 4-5/5 wrist and fingers. RLE: 2+/5 HF/HAB and 1-2/5 elsewhere. DTR's 1+. Sensory exam normal for light touch and pain in all 4  limbs. Sensory exam normal for light touch and pain in all 4 limbs. No limb ataxia or cerebellar signs. No abnormal tone appreciated.  Motor/sensory exam unchanged 6/11    Assessment/Plan: 1. Functional deficits which require 3+ hours per day of interdisciplinary therapy in a comprehensive inpatient rehab setting. Physiatrist is providing close team supervision and 24 hour management of active medical problems listed below. Physiatrist and rehab team continue to assess barriers to discharge/monitor patient progress toward functional and medical goals  Care Tool:  Bathing    Body parts bathed by patient: Right arm, Chest, Left arm, Abdomen, Front perineal area, Right upper leg, Left upper leg, Right lower leg, Left lower leg, Face, Buttocks   Body parts bathed by helper: Buttocks     Bathing assist Assist Level: Set up assist     Upper Body Dressing/Undressing Upper body dressing   What is the patient wearing?: Pull over shirt    Upper body assist Assist Level: Independent with assistive device    Lower Body Dressing/Undressing Lower body dressing      What is the patient wearing?: Pants     Lower body assist Assist for lower body dressing: Supervision/Verbal cueing     Toileting Toileting  Toileting assist Assist for toileting: Contact Guard/Touching assist     Transfers Chair/bed transfer  Transfers assist     Chair/bed transfer assist level: Contact Guard/Touching assist     Locomotion Ambulation   Ambulation assist   Ambulation activity did not occur: Safety/medical concerns (0/5 R LE MMT)  Assist level: Contact Guard/Touching assist Assistive device: Walker-rolling Max distance: >514f   Walk 10 feet activity   Assist  Walk 10 feet activity did not occur: Safety/medical concerns (0/5 R LE MMT)  Assist level: Contact Guard/Touching assist Assistive device: Walker-rolling   Walk 50 feet activity   Assist Walk 50 feet with 2 turns  activity did not occur: Safety/medical concerns (0/5 R LE MMT)  Assist level: Contact Guard/Touching assist Assistive device: Walker-rolling    Walk 150 feet activity   Assist Walk 150 feet activity did not occur: Safety/medical concerns (0/5 R LE MMT)  Assist level: Contact Guard/Touching assist Assistive device: Walker-rolling    Walk 10 feet on uneven surface  activity   Assist Walk 10 feet on uneven surfaces activity did not occur: Safety/medical concerns (0/5 R LE MMT)   Assist level: Contact Guard/Touching assist Assistive device: Walker-rolling   Wheelchair     Assist Is the patient using a wheelchair?: Yes Type of Wheelchair: Manual Wheelchair activity did not occur: Safety/medical concerns (Per PT)  Wheelchair assist level: Independent Max wheelchair distance: >1540f   Wheelchair 50 feet with 2 turns activity    Assist        Assist Level: Independent   Wheelchair 150 feet activity     Assist      Assist Level: Independent   Blood pressure (!) 149/80, pulse 68, temperature 98.3 F (36.8 C), resp. rate 16, height '5\' 11"'$  (1.803 m), weight 130.4 kg, last menstrual period 06/08/2020, SpO2 98 %.    Medical Problem List and Plan: 1. Functional deficits secondary to left posterior frontal lobe metastatic breast cancer, s/p resection with right-sided weakness             -patient may shower             -ELOS/Goals: 14-20 days, min assist goals with PT/OT and mod I with SLP             -keep RUE supported given proximal muscle weakness.              -PRAFO to help support/stretch right heel cord. Consider night splint  Provided with podcast recommendations to learn more about role of diet in cancer prevention/treatment  [facility] on [facility]  Provided with Eat to Beat Disease book  Provided information regarding integrative oncologist  -Continue CIR  therapies including PT, OT   -Continue work on coordination and balance 2.  S/p craniotomy with impaired mobility -DVT/anticoagulation:  Mechanical:  Antiembolism stockings, knee (TED hose) Bilateral lower extremities Sequential compression devices, below knee Bilateral lower extremities Continue Eliquis as per NSGY recommendation. Discussed that she should continue this until follow-up with NSGY outpatient.  3. Postoperative pain: Tylenol, Decrease Norco to 1 tab q8H as needed- discussed sending 1 week supply of Norco on discharge. Provided with pain relief journal.  4. Mood: LCSW to evaluate and provide emotional support             -antipsychotic agents: n/a 5. Neuropsych: This patient is capable of making decisions on her own behalf. 6. Craniotomy incision site: staples removed  without complication. Site inspected and hearing well. 7. 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 Continue milk of magnesia daily PRN 11: Sensitivity to [employer] laundry products; is not allergic to baking powder 12: Intermittent elevated BP: continue to monitor --discontinue clonidine -6/12 BP well controlled, monitor 13: Morbid obesity: BMI 43.97; dietary counseling  14. Insomnia: continue melatonin  15. Leukocytosis  5/27- WBC 15.9- but since last labs, have new Decadron 4 mg q6 hours- afebrile- no signs/Sx's of illness- will recheck Monday and monitor  5/29 WBC is 16.5- discussed with patient that this has been stable.   6/7 steroid wean discussed with NSGY  6/9 WBC 8.7 improved, continue to monitor 16. Hyperglycemia  5/28- started sensitive SSI and CBGs since had BG  of 207 on BMP yesterday.  5/29- discussed how decadron will affects cbg's. Hopefully with taper we'll see improvement. Sugars still high this morning  5/30: start metformin '500mg'$  daily.   5/31: increase metformin to '500mg'$  BID.   6/1: increase metformin to '850mg'$   6/3 please check with NSGY on Monday if it would be ok to taper steroids to '2mg'$  q8H.   6/5 adjusted her CBG schedule to start at 4am for fasting, called NSGY, left message with my information  6/6: decrease steroids to '2mg'$  q8H and will ask NSGY today weaning schedule for steroids 6/7: wean steroids to BID on 6/10  6/8: requested pharmacy to put in following steroid wean and discussed with patient: 6/12: '1mg'$  TID, 6/16: '1mg'$  BID, 6/20: '1mg'$  daily for 4 days then off. Discussed plan to stop metformin/CBGs/insulin tomorrow.   6/9: Stopped metformin per Kr Raulkars plan yesterday, will continue to follow  CBG  CBG (last 3)  No results for input(s): "GLUCAP" in the last 72 hours.   6/10 cbgs remain elevated despite steroid taper, may ultimately need to renew metformin or similar  6/12 dexomethasone down to '1mg'$  Q8h, should help with glucose 17. Rash on chest: hydrocortisone cream ordered, change to prn 18. Tachycardia: resolved, continue to monitor HR TID  -HR has been stable in 70s and 80s mostly, follow          LOS: 20 days A FACE TO FACE EVALUATION WAS PERFORMED  Martha Clan P Channin Agustin 12/07/2021, 10:29 AM

## 2021-12-07 NOTE — Progress Notes (Signed)
Patient ID: Sheryl Mcmillan, female   DOB: [birth year], 46 y.o.   MRN: [id number]  Team Conference Report to Patient/Family  Team Conference discussion was reviewed with the patient and caregiver, including goals, any changes in plan of care and target discharge date.  Patient and caregiver express understanding and are in agreement.  The patient has a target discharge date of 12/08/21.  SW followed up with patient, daughter and friend on 6/14 to provide team conference updates and confirm DME. Patient confirmed DME has arrived and daughter took home. SW provided patient with the timeframe for discharge and what to except. No additional questions or concerns will continue to follow up.   Dyanne Iha 12/07/2021, 1:54 PM

## 2021-12-07 NOTE — Progress Notes (Signed)
  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  Intent: Palliative  Radiation Treatment Dates: 11/10/2021 through 11/10/2021 Site Technique Total Dose (Gy) Dose per Fx (Gy) Completed Fx Beam Energies  Brain: Brain [id number] IMRT 18/18 18 1/1 6XFFF   Narrative: The patient tolerated radiation therapy relatively well. She was discharged from the hospital last Friday and has steroid taper instructions from Dr. Kathyrn Sheriff.   Impression/Plan: 1. 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 The patient has been doing well since completion of radiotherapy. She will continue to be followed in the brain oncology program and have follow up with Dr. Chryl Heck as well.       Carola Rhine, PAC

## 2021-12-07 NOTE — Progress Notes (Signed)
Physical Therapy Session Note  Patient Details  Name: Sheryl Mcmillan MRN: [id number] Date of Birth: [birth year]  Today's Date: 12/07/2021 PT Individual Time: 9147-8295 PT Individual Time Calculation (min): 70 min   Short Term Goals: Week 1:  PT Short Term Goal 1 (Week 1): Pt will transfer bed<>chair with CGA PT Short Term Goal 1 - Progress (Week 1): Met PT Short Term Goal 2 (Week 1): Pt will complete sit to stand from chair height with MinA + RW PT Short Term Goal 2 - Progress (Week 1): Progressing toward goal PT Short Term Goal 3 (Week 1): Pt will complete amb of 10 ft with ModA + RW PT Short Term Goal 3 - Progress (Week 1): Progressing toward goal Week 2:  PT Short Term Goal 1 (Week 2): pt will transfer sit<>stand with LRAD and min A PT Short Term Goal 2 (Week 2): pt will transfer stand<>pivot with LRAD and mod A PT Short Term Goal 3 (Week 2): Pt will initate gait training with RW  Skilled Therapeutic Interventions/Progress Updates:   Received pt sitting in WC with R GRAFO donned. Pt agreeable to PT treatment and denied any pain during session. Session with emphasis on functional mobility/transfers, generalized strengthening and endurance, dynamic standing balance/coordination, NMR, and gait training. Pt performed WC mobility 139f x 2 trials using R hemi technique and mod I to/from therapy gym. Pt performed all transfers with RW and close supervision throughout session. Pt ambulated 1,2666fwith RW and CGA with 1 instance of LOB requiring min A to correct due to R toe catching when pt was distracted. Pt ambulated all the way to 4N and became emotional, stating last time she was there was when she was in ICU and could not move. Pt very appreciative of rehab program and therapy provided and motivated to continue progressing in outpatient. Of note when ambulating, pt occasionally kicking RW with RLE but aware and able to self-correct when focused. Pt able to stand and pick up object from floor  with RW and CGA for balance x 2 trials with no LOB. Worked on blocked practice sit<>stands on Airex with 1 UE support on RW and CGA x 10 reps - emphasis on flexing at hips and eccentric quad control. Then worked on dynamic standing balance performing RLE toe taps to 2 cones 4x10 reps with BUE support and CGA with emphasis on control and coordination. Took seated rest break then ambulated additional 1,16093fith RW and CGA - demonstrating same gait impairments. Concluded session with [facility] needs within reach.   Therapy Documentation Precautions:  Precautions Precautions: Fall Precaution Comments: R knee instability Restrictions Weight Bearing Restrictions: No Other Position/Activity Restrictions: GRAFO on RLE for ambulation/transfers  Therapy/Group: Individual Therapy AnnAlfonse Alpers, DPT  12/07/2021, 6:54 AM

## 2021-12-08 ENCOUNTER — Telehealth: Payer: Self-pay | Admitting: Licensed Clinical Social Worker

## 2021-12-08 LAB — BASIC METABOLIC PANEL
Anion gap: 12 (ref 5–15)
BUN: 16 mg/dL (ref 6–20)
CO2: 19 mmol/L — ABNORMAL LOW (ref 22–32)
Calcium: 8.4 mg/dL — ABNORMAL LOW (ref 8.9–10.3)
Chloride: 104 mmol/L (ref 98–111)
Creatinine, Ser: 0.64 mg/dL (ref 0.44–1.00)
GFR, Estimated: >60 mL/min (ref >60)
Glucose, Bld: 226 mg/dL — ABNORMAL HIGH (ref 70–99)
Potassium: 4.6 mmol/L (ref 3.5–5.1)
Sodium: 135 mmol/L (ref 135–145)

## 2021-12-08 LAB — CBC
HCT: 40 % (ref 36.0–46.0)
Hemoglobin: 13.3 g/dL (ref 12.0–15.0)
MCH: 28.5 pg (ref 26.0–34.0)
MCHC: 33.3 g/dL (ref 30.0–36.0)
MCV: 85.7 fL (ref 80.0–100.0)
Platelets: 162 10*3/uL (ref 150–400)
RBC: 4.67 MIL/uL (ref 3.87–5.11)
RDW: 15.6 % — ABNORMAL HIGH (ref 11.5–15.5)
WBC: 7.2 10*3/uL (ref 4.0–10.5)
nRBC: 0 % (ref 0.0–0.2)

## 2021-12-08 NOTE — Telephone Encounter (Signed)
[place] Clinical Social Work  CSW attempted to contact patient by phone to check on coping and resource needs. No answer. Left VM and will plan to reach out again at a later date.   Grecia Lynk E Roshanna Cimino, LCSW

## 2021-12-08 NOTE — Progress Notes (Signed)
Patient being D/C today. Patient has no questions or concerns regarding D/C. Daughter picking patient up and transporting home.

## 2021-12-08 NOTE — Progress Notes (Signed)
PROGRESS NOTE   Subjective/Complaints: D/c home today She has no new complaints  ROS: Denies fevers, chills, SOB, HA or CP, +intermittent lower extremity pain   Objective:   No results found. Recent Labs    12/08/21 0506  WBC 7.2  HGB 13.3  HCT 40.0  PLT 162      Recent Labs    12/08/21 0506  NA 135  K 4.6  CL 104  CO2 19*  GLUCOSE 226*  BUN 16  CREATININE 0.64  CALCIUM 8.4*       Intake/Output Summary (Last 24 hours) at 12/08/2021 1004 Last data filed at 12/08/2021 0830 Gross per 24 hour  Intake 597 ml  Output --  Net 597 ml        Physical Exam: Vital Signs Blood pressure 133/60, pulse 78, temperature 98 F (36.7 C), temperature source Oral, resp. rate 17, height '5\' 11"'$  (1.803 m), weight 130.4 kg, last menstrual period 06/08/2020, SpO2 99 %.   Constitutional: No distress . Vital signs reviewed. Working in gym with therapy HEENT: NCAT, EOMI, oral membranes moist, conjugate gaze Neck: supple Cardiovascular: RRR without murmur. No JVD    Respiratory/Chest: CTA Bilaterally without wheezes or rales. Normal effort    GI/Abdomen: BS +, non-tender, non-distended Ext: no clubbing, cyanosis, or edema Psych: pleasant and cooperative  Skin: incision CDI on scalp, warm and dry Musculoskeletal:        General: No swelling.     Comments: Right heel cord remains tight but can be stretched to neutral Can sit to stand in St[place] Ambulating RW CG Transferring to [facility] Right knee buckling Neurological:     Mental Status: She is alert.     Motor: Weakness present.     Comments: Alert and oriented x 3. Normal insight and awareness. Intact Memory. Normal language and speech. Cranial nerve exam unremarkable for mild right central 7 still present. RUE 0/5 deltoids, 2+ to 3/5 biceps, triceps  and 4-5/5 wrist and fingers. RLE: 2+/5 HF/HAB and 1-2/5 elsewhere. DTR's 1+. Sensory exam normal for light touch and pain in  all 4 limbs. Sensory exam normal for light touch and pain in all 4 limbs. No limb ataxia or cerebellar signs. No abnormal tone appreciated.  Motor/sensory exam unchanged 6/11    Assessment/Plan: 1. Functional deficits which require 3+ hours per day of interdisciplinary therapy in a comprehensive inpatient rehab setting. Physiatrist is providing close team supervision and 24 hour management of active medical problems listed below. Physiatrist and rehab team continue to assess barriers to discharge/monitor patient progress toward functional and medical goals  Care Tool:  Bathing    Body parts bathed by patient: Right arm, Chest, Left arm, Abdomen, Front perineal area, Left upper leg, Right lower leg, Left lower leg, Face, Buttocks   Body parts bathed by helper: Buttocks     Bathing assist Assist Level: Independent with assistive device     Upper Body Dressing/Undressing Upper body dressing   What is the patient wearing?: Pull over shirt    Upper body assist Assist Level: Independent    Lower Body Dressing/Undressing Lower body dressing      What is the patient wearing?: Pants  Lower body assist Assist for lower body dressing: Independent with assitive device     Toileting Toileting    Toileting assist Assist for toileting: Supervision/Verbal cueing     Transfers Chair/bed transfer  Transfers assist     Chair/bed transfer assist level: Supervision/Verbal cueing     Locomotion Ambulation   Ambulation assist   Ambulation activity did not occur: Safety/medical concerns (0/5 R LE MMT)  Assist level: Contact Guard/Touching assist Assistive device: Walker-rolling Max distance: 597f   Walk 10 feet activity   Assist  Walk 10 feet activity did not occur: Safety/medical concerns (0/5 R LE MMT)  Assist level: Contact Guard/Touching assist Assistive device: Walker-rolling   Walk 50 feet activity   Assist Walk 50 feet with 2 turns activity did not  occur: Safety/medical concerns (0/5 R LE MMT)  Assist level: Contact Guard/Touching assist Assistive device: Walker-rolling    Walk 150 feet activity   Assist Walk 150 feet activity did not occur: Safety/medical concerns (0/5 R LE MMT)  Assist level: Contact Guard/Touching assist Assistive device: Walker-rolling    Walk 10 feet on uneven surface  activity   Assist Walk 10 feet on uneven surfaces activity did not occur: Safety/medical concerns (0/5 R LE MMT)   Assist level: Contact Guard/Touching assist Assistive device: Walker-rolling   Wheelchair     Assist Is the patient using a wheelchair?: Yes Type of Wheelchair: Manual Wheelchair activity did not occur: Safety/medical concerns (Per PT)  Wheelchair assist level: Independent Max wheelchair distance: >1575f   Wheelchair 50 feet with 2 turns activity    Assist        Assist Level: Independent   Wheelchair 150 feet activity     Assist      Assist Level: Independent   Blood pressure 133/60, pulse 78, temperature 98 F (36.7 C), temperature source Oral, resp. rate 17, height '5\' 11"'$  (1.803 m), weight 130.4 kg, last menstrual period 06/08/2020, SpO2 99 %.    Medical Problem List and Plan: 1. Functional deficits secondary to left posterior frontal lobe metastatic breast cancer, s/p resection with right-sided weakness             -patient may shower             -ELOS/Goals: 14-20 days, min assist goals with PT/OT and mod I with SLP             -keep RUE supported given proximal muscle weakness.              -PRAFO to help support/stretch right heel cord. Consider night splint  Provided with podcast recommendations to learn more about role of diet in cancer prevention/treatment  [facility] on [facility]  Provided with Eat to Beat Disease book  Provided information regarding integrative oncologist  -Continue CIR  therapies including PT, OT   -Continue work on coordination and balance 2.  S/p craniotomy with impaired mobility -DVT/anticoagulation:  Mechanical:  Antiembolism stockings, knee (TED hose) Bilateral lower extremities Sequential compression devices, below knee Bilateral lower extremities Continue Eliquis as per NSGY recommendation. Discussed that she should continue this until follow-up with NSGY outpatient.  3. Postoperative pain: Tylenol, Decrease Norco to 1 tab q8H as needed- discussed sending 1 week supply of Norco on discharge. Provided with pain relief journal.  4. Mood: LCSW to evaluate and provide emotional support             -antipsychotic  agents: n/a 5. Neuropsych: This patient is capable of making decisions on her own behalf. 6. Craniotomy incision site: staples removed  without complication. Site inspected and hearing well. 7. 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 Continue milk of magnesia daily PRN 11: Sensitivity to [employer] laundry products; is not allergic to baking powder 12: Intermittent elevated BP: continue to monitor --discontinue clonidine -6/12 BP well controlled, monitor 13: Morbid obesity: BMI 43.97; dietary counseling  14. Insomnia: continue melatonin  15. Leukocytosis  5/27- WBC 15.9- but since last labs, have new Decadron 4 mg q6 hours- afebrile- no signs/Sx's of illness- will recheck Monday and monitor  5/29 WBC is 16.5- discussed with patient that this has been stable.   6/7 steroid wean discussed with NSGY  6/9 WBC 8.7 improved, continue to monitor 16. Hyperglycemia  5/28- started sensitive SSI and CBGs since had BG  of 207 on BMP yesterday.  5/29- discussed how decadron will affects cbg's. Hopefully with taper we'll see improvement. Sugars still high this morning  5/30: start metformin '500mg'$  daily.   5/31: increase metformin to '500mg'$  BID.   6/1: increase metformin to '850mg'$   6/3 please check with NSGY on Monday if it would be ok to taper steroids to '2mg'$  q8H.   6/5 adjusted her CBG schedule to start at 4am for fasting, called NSGY, left message with my information  6/6: decrease steroids to '2mg'$  q8H and will ask NSGY today weaning schedule for steroids 6/7: wean steroids to BID on 6/10  6/8: requested pharmacy to put in following steroid wean and discussed with patient: 6/12: '1mg'$  TID, 6/16: '1mg'$  BID, 6/20: '1mg'$  daily for 4 days then off. Discussed plan to stop metformin/CBGs/insulin tomorrow.   6/9: Stopped metformin per Kr Raulkars plan yesterday, will continue to follow  CBG  CBG (last 3)  No results for input(s): "GLUCAP" in the last 72 hours.   6/10 cbgs remain elevated despite steroid taper, may ultimately need to renew metformin or similar  6/12 dexomethasone down to '1mg'$  Q8h, should help with glucose 17. Rash on chest: hydrocortisone cream ordered, change to prn 18. Tachycardia: resolved, continue to monitor HR TID  -HR has been stable in 70s and 80s mostly, follow   >30 minutes spent in discharge of patient including review of medications and follow-up appointments, physical examination, and in answering all patient's questions         LOS: 21 days A FACE TO FACE EVALUATION WAS PERFORMED  Beverly Suriano P Jibril Mcminn 12/08/2021, 10:04 AM

## 2021-12-08 NOTE — Progress Notes (Signed)
Inpatient Rehabilitation Discharge Medication Review by a Pharmacist  A complete drug regimen review was completed for this patient to identify any potential clinically significant medication issues.  High Risk Drug Classes Is patient taking? Indication by Medication  Antipsychotic No   Anticoagulant Yes Xarelto - VTE prophylaxis  Antibiotic No   Opioid Yes Norco prn - moderate pain  Antiplatelet No   Hypoglycemics/insulin No   Vasoactive Medication Yes Clonidine - blood pressure  Chemotherapy No   Other Yes Dexamethasone taper - s/p brain lesion resection Omeprazole - GI prophylaxis MVI - supplement PRNs: Acetaminophen - mild pain Melatonin - sleep Methocarbamol - muscle spams     Type of Medication Issue Identified Description of Issue Recommendation(s)  Drug Interaction(s) (clinically significant)     Duplicate Therapy     Allergy     No Medication Administration End Date     Incorrect Dose     Additional Drug Therapy Needed     Significant med changes from prior encounter (inform family/care partners about these prior to discharge).    Other  Keppra course completed 11/24/21.     Clinically significant medication issues were identified that warrant physician communication and completion of prescribed/recommended actions by midnight of the next day:  No  Pharmacist comments:   Xarelto to stop after 7/4  Time spent performing this drug regimen review (minutes):  [address]   Arty Baumgartner, [place] 12/08/2021 11:32 AM

## 2021-12-11 ENCOUNTER — Other Ambulatory Visit: Payer: Self-pay | Admitting: Radiation Therapy

## 2021-12-11 ENCOUNTER — Ambulatory Visit
Admit: 2021-12-11 | Discharge: 2021-12-11 | Disposition: A | Discharge status: Home / Self Care | Payer: Medicaid Other | Attending: Radiation Oncology | Admitting: Radiation Oncology

## 2021-12-11 ENCOUNTER — Telehealth: Payer: Self-pay

## 2021-12-11 DIAGNOSIS — C7931 Secondary malignant neoplasm of brain: Secondary | ICD-10-CM

## 2021-12-11 DIAGNOSIS — Z170 Estrogen receptor positive status [ER+]: Secondary | ICD-10-CM

## 2021-12-11 NOTE — Telephone Encounter (Signed)
Spoke with Mrs. Shall about her upcoming brain MRI in Aug and telephone follow-up with Shona Simpson PA-C.   Mont Dutton R.T.(R)(T) Special Procedures Navigator

## 2021-12-13 ENCOUNTER — Ambulatory Visit: Payer: Medicaid Other | Admitting: Physical Therapy

## 2021-12-13 ENCOUNTER — Other Ambulatory Visit: Payer: Self-pay

## 2021-12-13 ENCOUNTER — Ambulatory Visit: Payer: Medicaid Other | Attending: Physician Assistant | Admitting: Occupational Therapy

## 2021-12-13 DIAGNOSIS — R2681 Unsteadiness on feet: Secondary | ICD-10-CM | POA: Insufficient documentation

## 2021-12-13 DIAGNOSIS — R29898 Other symptoms and signs involving the musculoskeletal system: Secondary | ICD-10-CM | POA: Insufficient documentation

## 2021-12-13 DIAGNOSIS — R2689 Other abnormalities of gait and mobility: Secondary | ICD-10-CM

## 2021-12-13 DIAGNOSIS — R278 Other lack of coordination: Secondary | ICD-10-CM | POA: Insufficient documentation

## 2021-12-13 DIAGNOSIS — M6281 Muscle weakness (generalized): Secondary | ICD-10-CM

## 2021-12-13 NOTE — Therapy (Signed)
OUTPATIENT OCCUPATIONAL THERAPY NEURO EVALUATION  Patient Name: Sheryl Mcmillan MRN: [id number] DOB:Jul 25, 1975, 46 y.o., female Today's Date: 12/13/2021  PCP: Windell Hummingbird, PA-C REFERRING PROVIDER: Cathlyn Parsons, PA-C    OT End of Session - 12/13/21 1454     Visit Number 1    Number of Visits 17    Date for OT Re-Evaluation 02/11/22    Authorization Type [place] Medicaid [employer]    Authorization Time Period TBD    OT Start Time 1449    OT Stop Time 1532    OT Time Calculation (min) 43 min    Behavior During Therapy WFL for tasks assessed/performed            Past Medical History:  Diagnosis Date   Arthritis    Breast cancer ([place])    Family history of non-Hodgkin's lymphoma 06/01/2020   GERD (gastroesophageal reflux disease)    Hemorrhoids    Migraines    PCOS (polycystic ovarian syndrome)    PONV (postoperative nausea and vomiting)    Past Surgical History:  Procedure Laterality Date   APPLICATION OF CRANIAL NAVIGATION Left 11/10/2021   Procedure: APPLICATION OF CRANIAL NAVIGATION;  Surgeon: Consuella Lose, MD;  Location: [facility];  Service: Neurosurgery;  Laterality: Left;   AXILLARY SENTINEL NODE BIOPSY Left 11/07/2020   Procedure: LEFT AXILLARY SENTINEL NODE BIOPSY;  Surgeon: [name], MD;  Location: [facility];  Service: General;  Laterality: Left;   BREAST CYST EXCISION Right 09/22/2021   Procedure: Excision of right axillary excess soft tissue.;  Surgeon: [name], MD;  Location: [facility];  Service: Plastics;  Laterality: Right;   BREAST RECONSTRUCTION WITH PLACEMENT OF TISSUE EXPANDER AND FLEX HD (ACELLULAR HYDRATED DERMIS) Bilateral 11/07/2020   Procedure: BILATERAL BREAST RECONSTRUCTION WITH PLACEMENT OF TISSUE EXPANDER AND FLEX HD (ACELLULAR HYDRATED DERMIS);  Surgeon: [name], MD;  Location: [facility];  Service: Plastics;  Laterality: Bilateral;   COSMETIC SURGERY     CRANIOTOMY Left 11/10/2021   Procedure: LT FRONTAL  CRANIOTOMY TUMOR EXCISION;  Surgeon: Consuella Lose, MD;  Location: [facility];  Service: Neurosurgery;  Laterality: Left;   DILATION AND CURETTAGE OF UTERUS     HEMORRHOID SURGERY     IR IMAGING GUIDED PORT INSERTION  06/15/2020   LIPOSUCTION WITH LIPOFILLING Bilateral 09/22/2021   Procedure: Fat grafting bilateral breast;  Surgeon: [name], MD;  Location: [facility];  Service: Plastics;  Laterality: Bilateral;   MASS EXCISION Bilateral 04/25/2021   Procedure: excision of bilateral axillary breast tissue;  Surgeon: [name], MD;  Location: [facility];  Service: Plastics;  Laterality: Bilateral;   PORT-A-CATH REMOVAL Right 11/07/2020   Procedure: REMOVAL PORT-A-CATH;  Surgeon: [name], MD;  Location: [place] [facility];  Service: General;  Laterality: Right;   REMOVAL OF BILATERAL TISSUE EXPANDERS WITH PLACEMENT OF BILATERAL BREAST IMPLANTS Bilateral 04/25/2021   Procedure: REMOVAL OF BILATERAL TISSUE EXPANDERS WITH PLACEMENT OF BILATERAL BREAST IMPLANTS;  Surgeon: [name], MD;  Location: [facility];  Service: Plastics;  Laterality: Bilateral;   ROBOTIC ASSISTED TOTAL HYSTERECTOMY WITH BILATERAL SALPINGO OOPHERECTOMY Bilateral 03/14/2021   Procedure: XI ROBOTIC ASSISTED TOTAL HYSTERECTOMY WITH BILATERAL SALPINGO OOPHORECTOMY;  Surgeon: Everitt Amber, MD;  Location: [facility];  Service: Gynecology;  Laterality: Bilateral;   TOTAL MASTECTOMY Bilateral 11/07/2020   Procedure: BILATERAL MASTECTOMY;  Surgeon: [name], MD;  Location: [facility];  Service: General;  Laterality: Bilateral;   Patient Active Problem List  Diagnosis Date Noted   Brain tumor ([place]) 11/08/2021   Metastatic cancer to brain ([place]) 11/08/2021   Cerebral edema ([place]) 11/08/2021   Complex partial seizure ([place]) 11/08/2021   Brain mass 11/07/2021   Right sided weakness 11/07/2021   IDA (iron deficiency anemia) 07/18/2021   Cholelithiasis 03/14/2021   Peripheral  neuropathy due to chemotherapy ([place]) 10/20/2020   Morbid obesity with BMI of 40.0-44.9, adult ([place]) 07/19/2020   BRCA1 gene mutation positive 07/04/2020   Genetic testing 06/28/2020   Family history of non-Hodgkin's lymphoma 06/01/2020   Malignant neoplasm of upper-outer quadrant of left breast in female, estrogen receptor positive ([place]) 05/26/2020    ONSET DATE: 11/07/21  REFERRING DIAG: D49.6 (ICD-10-CM) - Neoplasm of unspecified behavior of brain   THERAPY DIAG:  Other lack of coordination  Other abnormalities of gait and mobility  Weakness of right arm  Rationale for Evaluation and Treatment Rehabilitation  SUBJECTIVE:   SUBJECTIVE STATEMENT: Pt arrives for OP OT evaluation w/ primary concerns related to decreased functional use of RUE and decreased steadiness and stability during functional mobility. Reports she presented to the hospital w/ R-sided weakness and imaging revealed a brain tumor consistent w/ metastatic breast cancer. States she has had multiple surgeries within the past year. Pt also was in IP rehab for a month (5/26-6/16/23), just getting home this past Friday, which she states has been "an adjustment." Pt accompanied by: self  PERTINENT HISTORY: Brain metastasis s/p L frontal craniotomy for tumor resection on 11/10/21 w/ residual R-sided weakness; HER2 positive breast cancer (s/p bilateral mastectomy 11/07/20); excision of R and L axillary excess soft tissue 04/2021 and 08/2021)  PRECAUTIONS: Fall  WEIGHT BEARING RESTRICTIONS No  PAIN: Are you having pain? No  FALLS: Has patient fallen in last 6 months? 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)  PATIENT GOALS: Get my independence back; "use my arm and leg  again like I was"  OBJECTIVE:   HAND DOMINANCE: Right  ADLs: Overall ADLs: Mod I w/ UB/LB dressing, grooming, self-feeding, toileting Transfers/ambulation related to ADLs: w/c and RW for in-home mobility; w/c does not fit into her bedroom and RW does not fit into the bathroom Toileting: using BSC in her bedroom due to decreased access to her bathroom at this time Bathing: completing bedside bathing due to not being able to fit RW through bathroom door; "I would have to walk sideways and I'm not comfortable trying that yet" Equipment: Reacher, Long handled shoe horn, and Long handled sponge  IADLs: Shopping: Has not attempted since d/c home from hospital Light housekeeping: Mod I (folding clothes, hand washing dishes) Meal Prep: Set-up assist due to layout of kitchen Community mobility: Relying on others for driving at this time Medication management: Mod I - uses a pillbox and an alarm as a reminder Handwriting:  To be further assessed  MOBILITY STATUS:  Ambulation in/out of session w/ RW and RLE AFO at close SPV  ACTIVITY TOLERANCE: Increased fatigue since hospitalization, per pt report  UPPER EXTREMITY ROM  Active ROM Right Eval - 6/21 Left Eval - 6/21  Shoulder flexion 100 152  Shoulder abduction 95 98  Shoulder adduction    Shoulder extension    Shoulder internal rotation    Shoulder external rotation    Elbow flexion  Elbow extension 9 0  Wrist flexion    Wrist extension    Wrist ulnar deviation    Wrist radial deviation    Wrist pronation    Wrist supination 46 81  (Blank rows = not tested)  UPPER EXTREMITY MMT:     MMT Right Eval - 6/21 Left Eval - 6/21  Shoulder flexion 3/5 4/5  Shoulder abduction 3/5 4/5  Shoulder adduction 3+/5 4/5  Shoulder extension 3+/5 4/5  Shoulder internal rotation 3/5 4/5  Shoulder external rotation 3/5 4/5  Elbow flexion 3/5 4/5  Elbow extension 3/5 4/5  (Blank rows = not tested)  HAND FUNCTION: To be further  assessed  COORDINATION: 9 Hole Peg test: Right: 26.03 sec; Left: 19.57 sec Box and Blocks:  Right 49 blocks, Left 51 blocks  SENSATION: WFL; per pt report  MUSCLE TONE: WFL  COGNITION: Overall cognitive status: Within functional limits for tasks assessed  VISION: Subjective report: No concerns at this time  VISION ASSESSMENT: Not tested   TODAY'S TREATMENT:  N/A   PATIENT EDUCATION: Education provided on role and purpose of OT, as well as potential interventions and goals for therapy based on initial evaluation findings. Person educated: Patient Education method: Explanation Education comprehension: verbalized understanding   HOME EXERCISE PROGRAM: To be administered   GOALS: Goals reviewed with patient? Yes  SHORT TERM GOALS: Target date: 01/12/22  STG  Status:  1 Pt will demonstrate independence w/ initial updated HEP designed for UE strength and coordination Baseline: HEP administered in IP rehab Initial  2 Pt to improve functional reach of R, dominant UE by increasing R shoulder flexion to at least 115 degrees Baseline: 100 degrees (LUE 152 degrees) Initial  3 Pt to improve functional use of R, dominant UE by increasing forearm supination to at least 65 degrees Baseline: 46 degrees (LUE 81 degrees) Initial     LONG TERM GOALS: Target date: 02/11/22  LTG  Status:  1 Pt will demonstrate independence w/ HEP designed for increased functional use of R, dominant UE by d/c Baseline: HEP administered in IP rehab Initial  2 Pt will decrease time to complete 9-Hole Peg Test by at least 4 seconds w/ R hand for improved manipulation of functional manipulatives Baseline: Right: 26.03 sec; Left: 19.57 sec Initial  3 Pt will be able to safety ambulate a distance of at least 15 ft, with cane if needed, to improve safety and independence w/ toileting/bathing Baseline: Unable to safely access bathroom w/ RW at this time Initial  4 Pt will be able to place/retrieve  medium-weight object (around 5 lbs) to an overhead height in at least 3 trials Baseline: Grossly 3/5 (LUE grossly 4/5) Initial  5 Pt will complete simulated meal prep activity w/ Mod I and incorporation of compensatory strategies/AE prn, for improved safety w/ kitchen mobility Baseline: Set-up assist due to layout of kitchen Initial     ASSESSMENT:  CLINICAL IMPRESSION: Pt is a 46 y/o who presents to OP OT due to R-sided weakness and decreased coordination due 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 Pt will follow up with Dr. Ranell Patrick w/ PM&R and Dr. Kathyrn Sheriff w/ neurosurgery. Additional PMH includes h/o bilateral total mastectomies in May 2022 and subsequent excision of R and L axillary excess soft tissue 04/2021 and 08/2021; migraines; PCOS; arthritis; and GERD. 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 Pt will benefit  from skilled occupational therapy services to address strength and coordination, ROM, pain management, altered sensation, balance, GM/FM control, introduction of compensatory strategies/AE prn, and implementation of an HEP to improve participation in all ADLs and overall level of independence.   PERFORMANCE DEFICITS in functional skills including ADLs, IADLs, coordination, dexterity, sensation, ROM, strength, FMC, GMC, mobility, balance, endurance, and UE functional use.   IMPAIRMENTS are limiting patient from ADLs, IADLs, work, and leisure.   COMORBIDITIES may have co-morbidities that affects occupational performance. Patient will benefit from skilled OT to address above impairments and improve overall function.  MODIFICATION OR ASSISTANCE TO COMPLETE EVALUATION: Min-Moderate modification of tasks or assist with assess necessary to complete an evaluation.  OT OCCUPATIONAL PROFILE AND HISTORY: Detailed assessment: Review of records and additional  review of physical, cognitive, psychosocial history related to current functional performance.  CLINICAL DECISION MAKING: Moderate - several treatment options, min-mod task modification necessary  REHAB POTENTIAL: Good  EVALUATION COMPLEXITY: Moderate   PLAN: OT FREQUENCY: 2x/week  OT DURATION: 8 weeks  PLANNED INTERVENTIONS: self care/ADL training, therapeutic exercise, therapeutic activity, neuromuscular re-education, manual therapy, passive range of motion, balance training, functional mobility training, aquatic therapy, moist heat, cryotherapy, patient/family education, energy conservation, and DME and/or AE instructions  RECOMMENDED OTHER SERVICES: Pt transferring to [facility] location as this is closer to her residence; PT evaluation scheduled to be completed after OT session today  CONSULTED AND AGREED WITH PLAN OF CARE: Patient  PLAN FOR NEXT SESSION: RUE [place] and strengthening (closed-chain, light resistance); coordination activities; standing tolerance   Kathrine Cords, MSOT, OTR/L 12/13/2021, 4:45 PM

## 2021-12-13 NOTE — Therapy (Signed)
OUTPATIENT PHYSICAL THERAPY NEURO EVALUATION   Patient Name: Sheryl Mcmillan MRN: [id number] DOB:1975/10/13, 46 y.o., female Today's Date: 12/13/2021   PCP: Windell Hummingbird, PA-C REFERRING PROVIDER: Marlowe Shores, PA-C (to f/u with Izora Ribas, MD)   PT End of Session - 12/13/21 1628     Visit Number 1    Number of Visits 17    Date for PT Re-Evaluation 02/07/22    Authorization Type Medicaid [employer] 2023 (?auth required after 12 visits-combined)    PT Start Time 1532    PT Stop Time 1614    PT Time Calculation (min) 42 min    Equipment Utilized During Treatment Gait belt    Activity Tolerance Patient tolerated treatment well    Behavior During Therapy WFL for tasks assessed/performed             Past Medical History:  Diagnosis Date   Arthritis    Breast cancer ([place])    Family history of non-Hodgkin's lymphoma 06/01/2020   GERD (gastroesophageal reflux disease)    Hemorrhoids    Migraines    PCOS (polycystic ovarian syndrome)    PONV (postoperative nausea and vomiting)    Past Surgical History:  Procedure Laterality Date   APPLICATION OF CRANIAL NAVIGATION Left 11/10/2021   Procedure: APPLICATION OF CRANIAL NAVIGATION;  Surgeon: Consuella Lose, MD;  Location: [facility];  Service: Neurosurgery;  Laterality: Left;   AXILLARY SENTINEL NODE BIOPSY Left 11/07/2020   Procedure: LEFT AXILLARY SENTINEL NODE BIOPSY;  Surgeon: [name], MD;  Location: [facility];  Service: General;  Laterality: Left;   BREAST CYST EXCISION Right 09/22/2021   Procedure: Excision of right axillary excess soft tissue.;  Surgeon: [name], MD;  Location: [facility];  Service: Plastics;  Laterality: Right;   BREAST RECONSTRUCTION WITH PLACEMENT OF TISSUE EXPANDER AND FLEX HD (ACELLULAR HYDRATED DERMIS) Bilateral 11/07/2020   Procedure: BILATERAL BREAST RECONSTRUCTION WITH PLACEMENT OF TISSUE EXPANDER AND FLEX HD (ACELLULAR HYDRATED DERMIS);  Surgeon: [name],  MD;  Location: [facility];  Service: Plastics;  Laterality: Bilateral;   COSMETIC SURGERY     CRANIOTOMY Left 11/10/2021   Procedure: LT FRONTAL CRANIOTOMY TUMOR EXCISION;  Surgeon: Consuella Lose, MD;  Location: [facility];  Service: Neurosurgery;  Laterality: Left;   DILATION AND CURETTAGE OF UTERUS     HEMORRHOID SURGERY     IR IMAGING GUIDED PORT INSERTION  06/15/2020   LIPOSUCTION WITH LIPOFILLING Bilateral 09/22/2021   Procedure: Fat grafting bilateral breast;  Surgeon: [name], MD;  Location: [facility];  Service: Plastics;  Laterality: Bilateral;   MASS EXCISION Bilateral 04/25/2021   Procedure: excision of bilateral axillary breast tissue;  Surgeon: [name], MD;  Location: [facility];  Service: Plastics;  Laterality: Bilateral;   PORT-A-CATH REMOVAL Right 11/07/2020   Procedure: REMOVAL PORT-A-CATH;  Surgeon: [name], MD;  Location: [facility];  Service: General;  Laterality: Right;   REMOVAL OF BILATERAL TISSUE EXPANDERS WITH PLACEMENT OF BILATERAL BREAST IMPLANTS Bilateral 04/25/2021   Procedure: REMOVAL OF BILATERAL TISSUE EXPANDERS WITH PLACEMENT OF BILATERAL BREAST IMPLANTS;  Surgeon: [name], MD;  Location: [facility];  Service: Plastics;  Laterality: Bilateral;   ROBOTIC ASSISTED TOTAL HYSTERECTOMY WITH BILATERAL SALPINGO OOPHERECTOMY Bilateral 03/14/2021   Procedure: XI ROBOTIC ASSISTED TOTAL HYSTERECTOMY WITH BILATERAL SALPINGO OOPHORECTOMY;  Surgeon: Everitt Amber, MD;  Location: [facility];  Service: Gynecology;  Laterality: Bilateral;   TOTAL MASTECTOMY Bilateral 11/07/2020   Procedure:  BILATERAL MASTECTOMY;  Surgeon: [name], MD;  Location: [facility];  Service: General;  Laterality: Bilateral;   Patient Active Problem List   Diagnosis Date Noted   Brain tumor ([place]) 11/08/2021   Metastatic cancer to brain ([place]) 11/08/2021   Cerebral edema ([place]) 11/08/2021   Complex partial seizure ([place]) 11/08/2021   Brain  mass 11/07/2021   Right sided weakness 11/07/2021   IDA (iron deficiency anemia) 07/18/2021   Cholelithiasis 03/14/2021   Peripheral neuropathy due to chemotherapy ([place]) 10/20/2020   Morbid obesity with BMI of 40.0-44.9, adult ([place]) 07/19/2020   BRCA1 gene mutation positive 07/04/2020   Genetic testing 06/28/2020   Family history of non-Hodgkin's lymphoma 06/01/2020   Malignant neoplasm of upper-outer quadrant of left breast in female, estrogen receptor positive ([place]) 05/26/2020    ONSET DATE: 12/05/2021 (MD referral)  REFERRING DIAG: D49.6 (ICD-10-CM) - Neoplasm of unspecified behavior of brain  THERAPY DIAG:  Other abnormalities of gait and mobility  Muscle weakness (generalized)  Unsteadiness on feet  Rationale for Evaluation and Treatment Rehabilitation  SUBJECTIVE:                                                                                                                                                                                              SUBJECTIVE STATEMENT: Woke up from the surgery and couldn't move my R side at all.  Made a lot of improvements in therapy.  Didn't have any control of my leg.  My knee down is not yet back.  To follow up with cancer center tomorrow-for them to determine future treatment. Pt accompanied by: self  PERTINENT HISTORY: metastatic breast cancer, brain tumor s/p excision with craniotomy; GERD, morbid obesity.  Admitted to rehab ([facility]) 11/17/21 and d/c home 12/09/2021  PAIN:  Are you having pain? No  PRECAUTIONS: Fall and Other: wearing R GRAFO, no driving  FALLS: Has patient fallen in last 6 months? 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 Now s/p L frontal crani tumor excision 5/19.  COGNITION: Overall cognitive status: Within functional limits for tasks assessed  SENSATION:  Just starting to get sensation back on bottom of R foot Light touch: WFL  COORDINATION: Decreased coordination RLE-needs assist of LLE or UEs to place RLE   EDEMA:  Noted in RLE-pt reports   MUSCLE TONE: RLE: Hypotonic   POSTURE: rounded shoulders  LOWER EXTREMITY ROM:     Passive  Right Eval Left Eval  Hip flexion    Hip extension    Hip abduction    Hip adduction    Hip internal rotation    Hip external rotation    Knee flexion    Knee extension    Ankle dorsiflexion -10 from neutral   Ankle plantarflexion    Ankle inversion    Ankle eversion     (Blank rows = not tested)  LOWER EXTREMITY MMT:    MMT Right Eval Left Eval  Hip flexion 3-/5 4/5  Hip extension    Hip abduction 3+/5 4/5  Hip adduction 3+/5 4/5  Hip internal rotation    Hip external rotation    Knee flexion 3-/5 4+/5  Knee extension 3-/5 4+/5  Ankle dorsiflexion trace 5/5  Ankle plantarflexion    Ankle inversion    Ankle eversion    (Blank rows = not tested)   TRANSFERS: Assistive device utilized: Environmental consultant - 2 wheeled  Sit to stand: SBA Stand to sit: SBA  GAIT: Gait pattern: step through pattern, decreased step length- Left, decreased stance time- Right, decreased ankle dorsiflexion- Right, and genu recurvatum- Right Distance walked: 60 ft  Assistive device utilized: Walker - 2 wheeled and R AFO Level of assistance: SBA Comments: Pt is able to don R AFO  FUNCTIONAL TESTs:  5 times sit to stand: 29.13 Timed up and go (TUG): 42.62 Gait velocity:  23.87 sec = 1.37 ft/sec 5x  sit<>stand performed with UE support  PATIENT SURVEYS:  FOTO TBA   PATIENT EDUCATION: Education details: PT POC, Eval results, initial HEP Person educated: Patient Education method: Explanation, Demonstration, and Handouts Education comprehension: verbalized understanding and returned demonstration   HOME EXERCISE PROGRAM: Access Code: [email or web address] URL: [email or web address] Date: 12/13/2021 Prepared by: [facility]  Exercises - Seated Heel Slide  - 1-2 x daily - 7 x weekly - 1 sets - 5-10 reps - 10-15 sec hold - Sit to Stand with Counter Support  - 1-2 x daily - 7 x weekly - 1-2 sets - 5 reps - Seated Gluteal Sets  - 1-2 x daily - 7 x weekly - 1-2 sets - 10 reps - 3 sec hold    GOALS: Goals reviewed with patient? Yes  SHORT TERM GOALS: Target date: 01/10/2022  Pt will be independent with HEP for improved strength, balance, gait. Baseline:  Initial HEP from CIR Goal status: INITIAL  2.  Pt will improve 5x sit<>stand to less than or equal to 20 sec to demonstrate improved functional strength and transfer efficiency. Baseline: 29.13 sec with BUE support (scores >15 sec indicate fall risk) Goal status: INITIAL  3.  Berg score to be assessed with goal to be written as appropriate. Baseline: Pt at increased fall risk per 5x sit<>stand and TUG Goal status: INITIAL   LONG TERM GOALS: Target date: 02/07/2022  Pt will be independent with HEP for improved strength, balance, transfers, and gait. Baseline:  Initial HEP from CIR Goal status: INITIAL  2.  Pt will improve 5x sit<>stand to less than or equal to 15 sec to demonstrate improved functional strength and transfer efficiency.  Baseline: 29.13 sec with BUE support (scores >15 sec indicate fall risk) Goal status: INITIAL  3.  Pt will improve TUG score to less than or equal to 25 sec for decreased fall risk. Baseline: 42.62 sec (indicates increased fall risk and difficulty with  ADLs in home) Goal status: INITIAL  4.  Pt will improve gait velocity to at least 1.8 ft/sec for improved gait efficiency and safety. Baseline: 1.37 ft/sec (1.31-2.62 indicates increased limited community ambulator) Goal status: INITIAL  5.  FOTO score to be assessed and score to improve to predicted level at d/c, indicating improved overall functional mobility. Baseline: not assessed due to time constraints at eval Goal status: INITIAL  ASSESSMENT:  CLINICAL IMPRESSION: Patient is a 46 y.o. female who was seen today for physical therapy evaluation and treatment for metastatic brain cancer, s/p excision with craniotomy on 11/10/2021.  She participated in inpatient rehab and was d/c home 12/09/2021.  Pt has R hemiplegia, with decreased muscle strength, decreased flexibility, decreased balance, decreased independence with gait.  She demonstrates increased fall risk per 5 time sit<>stand test, TUG, and gait velocity scores.  Prior to hospitalization and surgery, she was independent and working.  She would benefit from skilled PT to address the stated deficits to decrease fall risk and improve functional mobility and independence.  OBJECTIVE IMPAIRMENTS Abnormal gait, decreased balance, decreased mobility, difficulty walking, decreased ROM, decreased strength, impaired flexibility, and impaired tone.   ACTIVITY LIMITATIONS standing, transfers, locomotion level, and caring for others  PARTICIPATION LIMITATIONS: meal prep, driving, shopping, community activity, and occupation  PERSONAL FACTORS 3+ comorbidities: see above for PMH  are also affecting patient's functional outcome.   REHAB POTENTIAL: Good  CLINICAL DECISION MAKING: Evolving/moderate complexity  EVALUATION COMPLEXITY: Moderate  PLAN: PT FREQUENCY: 2x/week  PT DURATION: 8 weeks plus eval  PLANNED INTERVENTIONS: Therapeutic exercises, Therapeutic activity, Neuromuscular re-education, Balance training, Gait training,  Patient/Family education, Joint mobilization, Orthotic/Fit training, DME instructions, Aquatic Therapy, and Manual therapy  PLAN FOR NEXT SESSION: Complete FOTO and complete Berg.  Review HEP and add to HEP for strength and balance.  Work on R ankle range/flexibility, Geographical information systems officer.   Frazier Butt., PT 12/13/2021, 4:57 PM    [facility] at [facility] [address], [address] [place], [place] [address] Phone # [phone] Fax # [phone]

## 2021-12-13 NOTE — Patient Instructions (Signed)
Access Code: [email or web address] URL: [email or web address] Date: 12/13/2021 Prepared by: [facility]  Exercises - Seated Heel Slide  - 1-2 x daily - 7 x weekly - 1 sets - 5-10 reps - 10-15 sec hold - Sit to Stand with Counter Support  - 1-2 x daily - 7 x weekly - 1-2 sets - 5 reps - Seated Gluteal Sets  - 1-2 x daily - 7 x weekly - 1-2 sets - 10 reps - 3 sec hold

## 2021-12-14 ENCOUNTER — Encounter: Payer: Self-pay | Admitting: Occupational Therapy

## 2021-12-15 ENCOUNTER — Ambulatory Visit: Payer: Medicaid Other | Admitting: Hematology and Oncology

## 2021-12-18 ENCOUNTER — Ambulatory Visit: Payer: Medicaid Other | Admitting: Occupational Therapy

## 2021-12-20 ENCOUNTER — Inpatient Hospital Stay: Payer: Medicaid Other | Attending: Oncology | Admitting: Hematology and Oncology

## 2021-12-20 ENCOUNTER — Ambulatory Visit (INDEPENDENT_AMBULATORY_CARE_PROVIDER_SITE_OTHER): Payer: Medicaid Other | Admitting: Plastic Surgery

## 2021-12-20 ENCOUNTER — Encounter: Payer: Self-pay | Admitting: Plastic Surgery

## 2021-12-20 ENCOUNTER — Other Ambulatory Visit: Payer: Self-pay

## 2021-12-20 VITALS — BP 130/67 | HR 104 | Temp 97.7°F | Resp 19 | Ht 71.0 in | Wt 296.4 lb

## 2021-12-20 DIAGNOSIS — C50412 Malignant neoplasm of upper-outer quadrant of left female breast: Secondary | ICD-10-CM | POA: Diagnosis present

## 2021-12-20 DIAGNOSIS — Z9013 Acquired absence of bilateral breasts and nipples: Secondary | ICD-10-CM | POA: Diagnosis not present

## 2021-12-20 DIAGNOSIS — Z170 Estrogen receptor positive status [ER+]: Secondary | ICD-10-CM | POA: Diagnosis not present

## 2021-12-20 DIAGNOSIS — Z90722 Acquired absence of ovaries, bilateral: Secondary | ICD-10-CM | POA: Diagnosis not present

## 2021-12-20 DIAGNOSIS — R634 Abnormal weight loss: Secondary | ICD-10-CM | POA: Insufficient documentation

## 2021-12-20 DIAGNOSIS — C7931 Secondary malignant neoplasm of brain: Secondary | ICD-10-CM | POA: Diagnosis present

## 2021-12-20 DIAGNOSIS — M6281 Muscle weakness (generalized): Secondary | ICD-10-CM | POA: Insufficient documentation

## 2021-12-20 DIAGNOSIS — Z9079 Acquired absence of other genital organ(s): Secondary | ICD-10-CM | POA: Diagnosis not present

## 2021-12-20 NOTE — Progress Notes (Signed)
Patient presents just under 3 months postop from fat grafting the breast and removal of right axillary soft tissue.  Unfortunately recently she was found to have a brain tumor which was thought to be metastatic breast cancer.  This was removed with the craniotomy.  She seems to be recovering reasonably well.  She did have some right-sided weakness that is gradually improving.  She feels that she has lost some weight which has changed the appearance of her breast and they look a little bit less full than they did at 1 time.  At some point she is interested in additional fat grafting but I encouraged her to not rush into that given her recent metastatic cancer diagnosis.  I am going to see her again in 2 to 3 months to reevaluate and we can discuss further if any additional fat grafting can be performed.  All of her questions were answered.

## 2021-12-20 NOTE — Assessment & Plan Note (Signed)
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  Discussion: I do not recommend any further systemic treatment based on no evidence of disease at this time.  Recommendation: PET CT scan in November and follow-up after that to discuss results.

## 2021-12-21 ENCOUNTER — Ambulatory Visit: Payer: Medicaid Other | Admitting: Occupational Therapy

## 2021-12-21 ENCOUNTER — Other Ambulatory Visit (HOSPITAL_COMMUNITY): Payer: Self-pay

## 2021-12-21 ENCOUNTER — Telehealth: Payer: Self-pay | Admitting: Hematology and Oncology

## 2021-12-21 ENCOUNTER — Encounter: Payer: Self-pay | Admitting: Occupational Therapy

## 2021-12-21 DIAGNOSIS — R29898 Other symptoms and signs involving the musculoskeletal system: Secondary | ICD-10-CM

## 2021-12-21 DIAGNOSIS — R278 Other lack of coordination: Secondary | ICD-10-CM | POA: Diagnosis not present

## 2021-12-21 DIAGNOSIS — R2689 Other abnormalities of gait and mobility: Secondary | ICD-10-CM

## 2021-12-21 NOTE — Telephone Encounter (Signed)
Scheduled appointment per 6/28 los. Patient is aware. 

## 2021-12-21 NOTE — Therapy (Signed)
OUTPATIENT OCCUPATIONAL THERAPY NEURO EVALUATION  Patient Name: Sheryl Mcmillan MRN: [id number] DOB:02/01/1976, 46 y.o., female Today's Date: 12/21/2021  PCP: Windell Hummingbird, PA-C REFERRING PROVIDER: Cathlyn Parsons, PA-C    OT End of Session - 12/21/21 1114     Visit Number 2    Number of Visits 17    Date for OT Re-Evaluation 02/11/22    Authorization Type [place] [employer]    Authorization Time Period TBD    OT Start Time 1104    OT Stop Time 1148    OT Time Calculation (min) 44 min    Activity Tolerance Patient tolerated treatment well    Behavior During Therapy WFL for tasks assessed/performed            Past Medical History:  Diagnosis Date   Arthritis    Breast cancer ([place])    Family history of non-Hodgkin's lymphoma 06/01/2020   GERD (gastroesophageal reflux disease)    Hemorrhoids    Migraines    PCOS (polycystic ovarian syndrome)    PONV (postoperative nausea and vomiting)    Past Surgical History:  Procedure Laterality Date   APPLICATION OF CRANIAL NAVIGATION Left 11/10/2021   Procedure: APPLICATION OF CRANIAL NAVIGATION;  Surgeon: Consuella Lose, MD;  Location: [place];  Service: Neurosurgery;  Laterality: Left;   AXILLARY SENTINEL NODE BIOPSY Left 11/07/2020   Procedure: LEFT AXILLARY SENTINEL NODE BIOPSY;  Surgeon: [name], MD;  Location: [place];  Service: General;  Laterality: Left;   BREAST CYST EXCISION Right 09/22/2021   Procedure: Excision of right axillary excess soft tissue.;  Surgeon: [name], MD;  Location: [place];  Service: Plastics;  Laterality: Right;   BREAST RECONSTRUCTION WITH PLACEMENT OF TISSUE EXPANDER AND FLEX HD (ACELLULAR HYDRATED DERMIS) Bilateral 11/07/2020   Procedure: BILATERAL BREAST RECONSTRUCTION WITH PLACEMENT OF TISSUE EXPANDER AND FLEX HD (ACELLULAR HYDRATED DERMIS);  Surgeon: [name], MD;  Location: [place];  Service: Plastics;  Laterality: Bilateral;   COSMETIC SURGERY      CRANIOTOMY Left 11/10/2021   Procedure: LT FRONTAL CRANIOTOMY TUMOR EXCISION;  Surgeon: Consuella Lose, MD;  Location: [place];  Service: Neurosurgery;  Laterality: Left;   DILATION AND CURETTAGE OF UTERUS     HEMORRHOID SURGERY     IR IMAGING GUIDED PORT INSERTION  06/15/2020   LIPOSUCTION WITH LIPOFILLING Bilateral 09/22/2021   Procedure: Fat grafting bilateral breast;  Surgeon: [name], MD;  Location: [place];  Service: Plastics;  Laterality: Bilateral;   MASS EXCISION Bilateral 04/25/2021   Procedure: excision of bilateral axillary breast tissue;  Surgeon: [name], MD;  Location: [place];  Service: Plastics;  Laterality: Bilateral;   PORT-A-CATH REMOVAL Right 11/07/2020   Procedure: REMOVAL PORT-A-CATH;  Surgeon: [name], MD;  Location: [place];  Service: General;  Laterality: Right;   REMOVAL OF BILATERAL TISSUE EXPANDERS WITH PLACEMENT OF BILATERAL BREAST IMPLANTS Bilateral 04/25/2021   Procedure: REMOVAL OF BILATERAL TISSUE EXPANDERS WITH PLACEMENT OF BILATERAL BREAST IMPLANTS;  Surgeon: [name], MD;  Location: [place];  Service: Plastics;  Laterality: Bilateral;   ROBOTIC ASSISTED TOTAL HYSTERECTOMY WITH BILATERAL SALPINGO OOPHERECTOMY Bilateral 03/14/2021   Procedure: XI ROBOTIC ASSISTED TOTAL HYSTERECTOMY WITH BILATERAL SALPINGO OOPHORECTOMY;  Surgeon: Everitt Amber, MD;  Location[facility];  Service: Gynecology;  Laterality: Bilateral;   TOTAL MASTECTOMY Bilateral 11/07/2020   Procedure: BILATERAL MASTECTOMY;  Surgeon: [name], MD;  Location: [place];  Service: General;  Laterality: Bilateral;   Patient Active Problem List   Diagnosis Date Noted   Brain tumor ([place]) 11/08/2021   Metastatic cancer to brain ([place]) 11/08/2021   Cerebral edema ([place]) 11/08/2021   Complex partial seizure ([place]) 11/08/2021   Brain mass 11/07/2021   Right sided weakness 11/07/2021   IDA (iron deficiency anemia) 07/18/2021    Cholelithiasis 03/14/2021   Peripheral neuropathy due to chemotherapy ([employer]) 10/20/2020   Morbid obesity with BMI of 40.0-44.9, adult ([place]) 07/19/2020   BRCA1 gene mutation positive 07/04/2020   Genetic testing 06/28/2020   Family history of non-Hodgkin's lymphoma 06/01/2020   Malignant neoplasm of upper-outer quadrant of left breast in female, estrogen receptor positive ([place]) 05/26/2020    ONSET DATE: 11/07/21  REFERRING DIAG: D49.6 (ICD-10-CM) - Neoplasm of unspecified behavior of brain   THERAPY DIAG:  Weakness of right arm  Other lack of coordination  Other abnormalities of gait and mobility  Rationale for Evaluation and Treatment Rehabilitation  SUBJECTIVE:   SUBJECTIVE STATEMENT: Pt reports things have been going well at home; she is completing all her exercises and now folding laundry, washing dishes, and moving around the house better Pt accompanied by: self  PAIN: Are you having pain? 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)  PATIENT GOALS: Get my independence back; "use my arm and leg again like I was"  OBJECTIVE:   HAND DOMINANCE: Right  UPPER EXTREMITY ROM  Active ROM Right Eval - 6/21 Left Eval - 6/21 Right 12/21/21  Shoulder flexion 100 152 122  Wrist supination 46 81 -  (Blank rows = not tested)  UPPER EXTREMITY MMT:     MMT Right Eval - 6/21 Right 12/21/21  Shoulder flexion 3/5 4/5  Shoulder abduction 3/5 4/5  Shoulder adduction 3+/5 4/5  Shoulder extension 3+/5 4/5  Shoulder internal rotation 3/5 4/5  Shoulder external rotation 3/5 4/5  Elbow flexion 3/5 4/5  Elbow extension 3/5 4/5  (Blank rows = not  tested)   TODAY'S TREATMENT - 12/21/21: Closed-chain overhead forward circles holding 3# dowel rod completed 2x20 w/out difficulty and pt demonstrating shoulder and elbow ROM WFL Picking up 1" blocks w/ power grip exerciser (yellow coil, 20 lbs) using R hand and placing in a container to facilitate functional grasp/release and hand strengthening; completed 20 blocks w/ min drops Reviewed putty exercises including gross grasp, thumb-to-finger opposition, thumb flexion, finger adduction, and lumbrical strengthening; OT provided relevant education for positioning prn and introduced all finger pinch-and-pull of putty and finger extension w/ pt able to return demonstration w/out difficulty. Red, med-soft putty administered this session. Picking up marbles one at a time, translating each to fingertips, and placing on easy-grip pegs to facilitate in-hand manipulation, Maria Antonia, and intrinsic hand strengthening of R hand. OT graded activity up from 3 marbles at a time to 5. Completed w/ min drops; increased success with decreased speed Using resistance clothespin (blue, 6 lbs) to retrieve marbles from easy-grip pegs and then place back onto pegs; activity facilitated palmar pinch/intrinsic hand strengthening and control/coordination. Pt completed 20 marbles w/ min drops; increased difficulty w/ increased repetition Coordination activities RUE: picking up 5-10 coins 1 at a time and translating palm to fingertips to place in a stack; completed w/ min drops  PATIENT EDUCATION: Ongoing condition-specific education; reviewed and updated HEP w/ pt able to demonstrate all included exercises from memory. Person educated: Patient Education method: Explanation Education comprehension: verbalized understanding   HOME EXERCISE PROGRAM: Administered in hospital - completing BUE theraband exercises, putty exercises   GOALS: Goals reviewed with patient? Yes  SHORT TERM GOALS: Target date: 01/12/22  STG  Status:  1  Pt will demonstrate independence w/ initial updated HEP designed for UE strength and coordination Baseline: HEP administered in IP rehab Met - 12/21/21  2 Pt to improve functional reach of R, dominant UE by increasing R shoulder flexion to at least 115 degrees Baseline: 100 degrees (LUE 152 degrees) Met - 12/21/21; 122 deg of flexion  3 Pt to improve functional use of R, dominant UE by increasing forearm supination to at least 65 degrees Baseline: 46 degrees (LUE 81 degrees) Progressing     LONG TERM GOALS: Target date: 02/11/22  LTG  Status:  1 Pt will demonstrate independence w/ HEP designed for increased functional use of R, dominant UE by d/c Baseline: HEP administered in IP rehab Progressing  2 Pt will decrease time to complete 9-Hole Peg Test by at least 4 seconds w/ R hand for improved manipulation of functional manipulatives Baseline: Right: 26.03 sec; Left: 19.57 sec Progressing - 12/21/21; 23.58 sec w/ RUE  3 Pt will be able to safety ambulate a distance of at least 15 ft, with cane if needed, to improve safety and independence w/ toileting/bathing Baseline: Unable to safely access bathroom w/ RW at this time Progressing  4 Pt will be able to place/retrieve medium-weight object (around 5 lbs) to an overhead height in at least 3 trials Baseline: Grossly 3/5 (LUE grossly 4/5) Progressing  5 Pt will complete simulated meal prep activity w/ Mod I and incorporation of compensatory strategies/AE prn, for improved safety w/ kitchen mobility Baseline: Set-up assist due to layout of kitchen Progressing     ASSESSMENT:  CLINICAL IMPRESSION: Pt returns for first treatment session after initial evaluation on 12/13/21. Pt appears to be doing very well, demonstrating full HEP administered to her in the hospital from memory and exhibiting shoulder AROM now [facility]. Due to this, OT incorporated closed-chain  strengthening, providing relevant education and then focused remainder of session on grip  strength, GMC, and FMC/dexterity. Pt did well w/ all exercises, demonstrating slightly decreased success w/ fatigue, but OT reassessed 9-HPT w/ pt already demonstrating 3 sec improvement since last week. Considering scheduling and insurance-based VL, OT introduced idea of reducing frequency to 1x/week to allow for focus on RLE and continued carryover of HEP to home; pt agreeable to plan at this time.  PERFORMANCE DEFICITS in functional skills including ADLs, IADLs, coordination, dexterity, sensation, ROM, strength, FMC, GMC, mobility, balance, endurance, and UE functional use.   IMPAIRMENTS are limiting patient from ADLs, IADLs, work, and leisure.   COMORBIDITIES may have co-morbidities that affects occupational performance. Patient will benefit from skilled OT to address above impairments and improve overall function.   PLAN: OT FREQUENCY: 1x/week  OT DURATION: 8 weeks  PLANNED INTERVENTIONS: self care/ADL training, therapeutic exercise, therapeutic activity, neuromuscular re-education, manual therapy, passive range of motion, balance training, functional mobility training, aquatic therapy, moist heat, cryotherapy, patient/family education, energy conservation, and DME and/or AE instructions  RECOMMENDED OTHER SERVICES: Pt transferring to [facility] location as this is closer to her residence; PT evaluation scheduled to be completed after OT session today  CONSULTED AND AGREED WITH PLAN OF CARE: Patient  PLAN FOR NEXT SESSION: Review bathroom transfers; functional mobility and balance; continue w/ RUE [place] and coordination activities   Kathrine Cords, MSOT, OTR/L 12/21/2021, 12:31 PM

## 2021-12-27 ENCOUNTER — Ambulatory Visit: Payer: Medicaid Other | Attending: General Surgery | Admitting: Physical Therapy

## 2021-12-27 ENCOUNTER — Inpatient Hospital Stay: Payer: Medicaid Other | Attending: Oncology | Admitting: Licensed Clinical Social Worker

## 2021-12-27 DIAGNOSIS — C50412 Malignant neoplasm of upper-outer quadrant of left female breast: Secondary | ICD-10-CM | POA: Insufficient documentation

## 2021-12-27 DIAGNOSIS — R293 Abnormal posture: Secondary | ICD-10-CM | POA: Insufficient documentation

## 2021-12-27 DIAGNOSIS — Z809 Family history of malignant neoplasm, unspecified: Secondary | ICD-10-CM | POA: Insufficient documentation

## 2021-12-27 DIAGNOSIS — N63 Unspecified lump in unspecified breast: Secondary | ICD-10-CM | POA: Insufficient documentation

## 2021-12-27 DIAGNOSIS — R2689 Other abnormalities of gait and mobility: Secondary | ICD-10-CM | POA: Insufficient documentation

## 2021-12-27 DIAGNOSIS — Z9013 Acquired absence of bilateral breasts and nipples: Secondary | ICD-10-CM | POA: Insufficient documentation

## 2021-12-27 DIAGNOSIS — Z483 Aftercare following surgery for neoplasm: Secondary | ICD-10-CM | POA: Insufficient documentation

## 2021-12-27 DIAGNOSIS — R278 Other lack of coordination: Secondary | ICD-10-CM | POA: Insufficient documentation

## 2021-12-27 DIAGNOSIS — M6281 Muscle weakness (generalized): Secondary | ICD-10-CM | POA: Insufficient documentation

## 2021-12-27 DIAGNOSIS — Z87891 Personal history of nicotine dependence: Secondary | ICD-10-CM | POA: Insufficient documentation

## 2021-12-27 DIAGNOSIS — Z806 Family history of leukemia: Secondary | ICD-10-CM | POA: Insufficient documentation

## 2021-12-27 DIAGNOSIS — Z170 Estrogen receptor positive status [ER+]: Secondary | ICD-10-CM | POA: Insufficient documentation

## 2021-12-27 DIAGNOSIS — C7931 Secondary malignant neoplasm of brain: Secondary | ICD-10-CM | POA: Insufficient documentation

## 2021-12-27 DIAGNOSIS — R2681 Unsteadiness on feet: Secondary | ICD-10-CM | POA: Insufficient documentation

## 2021-12-27 DIAGNOSIS — R29898 Other symptoms and signs involving the musculoskeletal system: Secondary | ICD-10-CM | POA: Insufficient documentation

## 2021-12-27 NOTE — Progress Notes (Signed)
[facility] CSW Progress Note  Clinical Education officer, museum contacted patient by phone. Pt checked on [employer] application which was submitted 11/28/21. She has not heard from them yet. CSW confirmed submission and that it takes 4-6 weeks.   Pt did speak with Cecille Rubin from [facility] who will work with pt on disability application.  Pt is at home and recovering. Recovery is not as fast as she would like, but she is progressing quickly per her medical team. She is trying to focus on that and stay positive but it is difficult at times. Pt would like to come in to see CSW and will check her availability and call back to schedule.    Sheryl Downard E Altan Kraai, LCSW    Patient is participating in a Managed Medicaid Plan:  Yes

## 2021-12-28 ENCOUNTER — Ambulatory Visit: Payer: Medicaid Other | Admitting: Occupational Therapy

## 2021-12-28 ENCOUNTER — Ambulatory Visit: Payer: Medicaid Other | Admitting: Physical Therapy

## 2021-12-28 ENCOUNTER — Telehealth: Payer: Self-pay | Admitting: Plastic Surgery

## 2021-12-28 ENCOUNTER — Encounter: Payer: Self-pay | Admitting: Occupational Therapy

## 2021-12-28 ENCOUNTER — Encounter: Payer: Medicaid Other | Admitting: Registered Nurse

## 2021-12-28 ENCOUNTER — Encounter: Payer: Self-pay | Admitting: Physical Therapy

## 2021-12-28 ENCOUNTER — Telehealth: Payer: Self-pay

## 2021-12-28 DIAGNOSIS — R278 Other lack of coordination: Secondary | ICD-10-CM

## 2021-12-28 DIAGNOSIS — R2681 Unsteadiness on feet: Secondary | ICD-10-CM

## 2021-12-28 DIAGNOSIS — Z483 Aftercare following surgery for neoplasm: Secondary | ICD-10-CM | POA: Diagnosis present

## 2021-12-28 DIAGNOSIS — R2689 Other abnormalities of gait and mobility: Secondary | ICD-10-CM | POA: Diagnosis present

## 2021-12-28 DIAGNOSIS — M6281 Muscle weakness (generalized): Secondary | ICD-10-CM | POA: Diagnosis present

## 2021-12-28 DIAGNOSIS — R293 Abnormal posture: Secondary | ICD-10-CM

## 2021-12-28 DIAGNOSIS — R29898 Other symptoms and signs involving the musculoskeletal system: Secondary | ICD-10-CM | POA: Diagnosis present

## 2021-12-28 NOTE — Therapy (Signed)
OUTPATIENT OCCUPATIONAL THERAPY TREATMENT NOTE & DISCHARGE SUMMARY   Patient Name: Sheryl Mcmillan MRN: [id number] DOB:Mar 01, 1976, 46 y.o., female Today's Date: 12/28/2021  PCP: Windell Hummingbird, PA-C REFERRING PROVIDER: Cathlyn Parsons, PA-C    OT End of Session - 12/28/21 1322     Visit Number 3    Number of Visits 17    Date for OT Re-Evaluation 02/11/22    Authorization Type [place] Medicaid [employer]    Authorization - Visit Number 2    Authorization - Number of Visits 3 - by 06/16/22   OT Start Time 3220    OT Stop Time 1358   OT Time Calculation (min) 41 min    Activity Tolerance Patient tolerated treatment well    Behavior During Therapy WFL for tasks assessed/performed            OCCUPATIONAL THERAPY DISCHARGE SUMMARY  Visits from Start of Care: 3  Current functional level related to goals / functional outcomes: Pt reports she is Mod I - Independent w/ all BADLs and IADLs aside from driving/community mobility; see goals below   Remaining deficits: Decreased GMC/strength of RLE impacting functional mobility, slightly decreased strength of RUE, and decreased endurance/overall activity tolerance   Education / Equipment: Condition-specific education; HEP; compensatory and safety strategies prn   Patient agrees to discharge. Patient goals were met. Patient is being discharged due to  meeting the stated rehab goals and being pleased with current functional level.   Past Medical History:  Diagnosis Date   Arthritis    Breast cancer ([place])    Family history of non-Hodgkin's lymphoma 06/01/2020   GERD (gastroesophageal reflux disease)    Hemorrhoids    Migraines    PCOS (polycystic ovarian syndrome)    PONV (postoperative nausea and vomiting)    Past Surgical History:  Procedure Laterality Date   APPLICATION OF CRANIAL NAVIGATION Left 11/10/2021   Procedure: APPLICATION OF CRANIAL NAVIGATION;  Surgeon: Consuella Lose, MD;  Location: [place];  Service:  Neurosurgery;  Laterality: Left;   AXILLARY SENTINEL NODE BIOPSY Left 11/07/2020   Procedure: LEFT AXILLARY SENTINEL NODE BIOPSY;  Surgeon: [name], MD;  Location: [place];  Service: General;  Laterality: Left;   BREAST CYST EXCISION Right 09/22/2021   Procedure: Excision of right axillary excess soft tissue.;  Surgeon: [name], MD;  [place];  Service: Plastics;  Laterality: Right;   BREAST RECONSTRUCTION WITH PLACEMENT OF TISSUE EXPANDER AND FLEX HD (ACELLULAR HYDRATED DERMIS) Bilateral 11/07/2020   Procedure: BILATERAL BREAST RECONSTRUCTION WITH PLACEMENT OF TISSUE EXPANDER AND FLEX HD (ACELLULAR HYDRATED DERMIS);  Surgeon: [name], MD;  Location: [place];  Service: Plastics;  Laterality: Bilateral;   COSMETIC SURGERY     CRANIOTOMY Left 11/10/2021   Procedure: LT FRONTAL CRANIOTOMY TUMOR EXCISION;  Surgeon: Consuella Lose, MD;  Location: [place];  Service: Neurosurgery;  Laterality: Left;   DILATION AND CURETTAGE OF UTERUS     HEMORRHOID SURGERY     IR IMAGING GUIDED PORT INSERTION  06/15/2020   LIPOSUCTION WITH LIPOFILLING Bilateral 09/22/2021   Procedure: Fat grafting bilateral breast;  Surgeon: [name], MD;  Location: [place];  Service: Plastics;  Laterality: Bilateral;   MASS EXCISION Bilateral 04/25/2021   Procedure: excision of bilateral axillary breast tissue;  Surgeon: [name], MD;  Location: [place];  Service: Plastics;  Laterality: Bilateral;   PORT-A-CATH REMOVAL Right 11/07/2020   Procedure: REMOVAL PORT-A-CATH;  Surgeon: [name],  MD;  Location: [place];  Service: General;  Laterality: Right;   REMOVAL OF BILATERAL TISSUE EXPANDERS WITH PLACEMENT OF BILATERAL BREAST IMPLANTS Bilateral 04/25/2021   Procedure: REMOVAL OF BILATERAL TISSUE EXPANDERS WITH PLACEMENT OF BILATERAL BREAST IMPLANTS;  Surgeon: [name], MD;  Location: [place];  Service: Plastics;   Laterality: Bilateral;   ROBOTIC ASSISTED TOTAL HYSTERECTOMY WITH BILATERAL SALPINGO OOPHERECTOMY Bilateral 03/14/2021   Procedure: XI ROBOTIC ASSISTED TOTAL HYSTERECTOMY WITH BILATERAL SALPINGO OOPHORECTOMY;  Surgeon: Everitt Amber, MD;  [facility];  Service: Gynecology;  Laterality: Bilateral;   TOTAL MASTECTOMY Bilateral 11/07/2020   Procedure: BILATERAL MASTECTOMY;  Surgeon: [name], MD;  Location: [place];  Service: General;  Laterality: Bilateral;   Patient Active Problem List   Diagnosis Date Noted   Brain tumor ([place]) 11/08/2021   Metastatic cancer to brain ([place]) 11/08/2021   Cerebral edema ([place]) 11/08/2021   Complex partial seizure ([place]) 11/08/2021   Brain mass 11/07/2021   Right sided weakness 11/07/2021   IDA (iron deficiency anemia) 07/18/2021   Cholelithiasis 03/14/2021   Peripheral neuropathy due to chemotherapy ([place]) 10/20/2020   Morbid obesity with BMI of 40.0-44.9, adult ([place]) 07/19/2020   BRCA1 gene mutation positive 07/04/2020   Genetic testing 06/28/2020   Family history of non-Hodgkin's lymphoma 06/01/2020   Malignant neoplasm of upper-outer quadrant of left breast in female, estrogen receptor positive ([place]) 05/26/2020    ONSET DATE: 11/07/21  REFERRING DIAG: D49.6 (ICD-10-CM) - Neoplasm of unspecified behavior of brain   THERAPY DIAG:  Other lack of coordination  Other abnormalities of gait and mobility  Weakness of right arm  Rationale for Evaluation and Treatment Rehabilitation  SUBJECTIVE:   SUBJECTIVE STATEMENT: Pt reports there are not any functional activities she is currently concerned about or having difficulty doing at home; states she is even getting in/out of the shower by herself now Pt accompanied by: self  PAIN: Are you having pain? 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)  PATIENT GOALS: Get my independence back; "use my arm and leg again like I was"  OBJECTIVE:   HAND DOMINANCE: Right   TODAY'S TREATMENT - 12/28/21: Picking up 1" blocks w/ power grip exerciser (yellow coil, 20 lbs) using R hand and placing in a stack to challenge NMR of [place] w/ slow, controlled movements; completed 2 stacks of 5 w/ slightly decreased grip strength/shaking observed BUE strengthening, tapping small 2 kg med ball at 3 levels of overhead height to simulate functional overhead reach, particularly during IADL tasks; completed 10x w/out difficulty Simple meal prep activity with focus on dynamic balance, functional mobility, and RUE functional use. Completed w/ 3-step recipe involving 6 ingredients at overall Mod Ind level. While at both w/c and ambulatory level, with use of table and countertop heights, pt completed the following to address meal prep requirements upon d/c: opening containers/packages, using microwave, and following a standard recipe.   PATIENT EDUCATION: Ongoing condition-specific education, particularly related to D/C Person educated: Patient Education method: Explanation Education comprehension: verbalized understanding   HOME EXERCISE PROGRAM: Administered in hospital - completing BUE theraband exercises, putty exercises; updated putty exercises and provided coordination activities handout   GOALS: Goals reviewed with patient? Yes  SHORT TERM GOALS:  Target date: 01/12/22  STG  Status:  1 Pt will demonstrate independence w/ initial updated HEP designed for UE strength and coordination Baseline: HEP administered in IP rehab Met - 12/21/21  2 Pt to improve functional reach of R, dominant UE by increasing R shoulder flexion to at least 115 degrees Baseline: 100 degrees (LUE 152  degrees) Met - 12/21/21; 122 deg of flexion  3 Pt to improve functional use of R, dominant UE by increasing forearm supination to at least 65 degrees Baseline: 46 degrees (LUE 81 degrees) Met - 12/28/21: 79 degrees     LONG TERM GOALS: Target date: 02/11/22  LTG  Status:  1 Pt will demonstrate independence w/ HEP designed for increased functional use of R, dominant UE by d/c Baseline: HEP administered in IP rehab Met - 12/28/21  2 Pt will decrease time to complete 9-Hole Peg Test by at least 4 seconds w/ R hand for improved manipulation of functional manipulatives Baseline: Right: 26.03 sec; Left: 19.57 sec Met - 12/28/21: 22 sec w/ RUE  3 Pt will be able to safety ambulate a distance of at least 15 ft, with cane if needed, to improve safety and independence w/ toileting/bathing Baseline: Unable to safely access bathroom w/ RW at this time Met - 12/28/21  4 Pt will be able to place/retrieve medium-weight object (around 5 lbs) to an overhead height in at least 3 trials Baseline: Grossly 3/5 (LUE grossly 4/5) Met - 12/28/21  5 Pt will complete simulated meal prep activity w/ Mod I and incorporation of compensatory strategies/AE prn, for improved safety w/ kitchen mobility Baseline: Set-up assist due to layout of kitchen Met - 12/28/21     ASSESSMENT:  CLINICAL IMPRESSION: Zofia Peckinpaugh is a 46 y/o who has been seen in OP OT for R-sided weakness and decreased coordination s/p brain metastasis 2/2 HER2 positive breast cancer w/ tumor resection on 11/10/21. Pt continues to report great compliance w/ HEP and understanding of provided education. Considering pt's report of lack of difficulty w/ functional activities at home and continued focus on balance and functional mobility in PT, OT discussed potential for d/c and reassessed progress toward goals. Pt has shown significant improvements in a short-period of time w/ all STGs and LTGs met. In preparation for d/c, OT reviewed HEP and condition-specific education,  answering pt questions as able. At this time, pt is appropriate for d/c from skilled OT to HEP, reports she is satisfied with progress, and is currently agreeable to discharge plan. Pt encouraged to contact OT w/ any further concerns or questions.  PERFORMANCE DEFICITS in functional skills including ADLs, IADLs, coordination, dexterity, sensation, ROM, strength, FMC, GMC, mobility, balance, endurance, and UE functional use.   IMPAIRMENTS are limiting patient from ADLs, IADLs, work, and leisure.   COMORBIDITIES may have co-morbidities that affects occupational performance. Patient will benefit from skilled OT to address above impairments and improve overall function.   PLAN: OT FREQUENCY: 1x/week  OT DURATION: 8 weeks  PLANNED INTERVENTIONS: self care/ADL training, therapeutic exercise, therapeutic activity, neuromuscular re-education, manual therapy, passive range of motion, balance training, functional mobility training, aquatic therapy, moist heat, cryotherapy, patient/family education, energy conservation, and DME and/or AE instructions  RECOMMENDED OTHER SERVICES: Currently receiving PT services at this location  CONSULTED AND AGREED WITH PLAN OF CARE: Patient  PLAN FOR NEXT SESSION: D/C   Kathrine Cords, MSOT, OTR/L 12/28/2021, 2:23 PM

## 2021-12-28 NOTE — Therapy (Signed)
OUTPATIENT PHYSICAL THERAPY NEURO EVALUATION   Patient Name: Sheryl Mcmillan MRN: [id number] DOB:[birth year], 46 y.o., female Today's Date: 12/28/2021   PCP: Windell Hummingbird, PA-C REFERRING PROVIDER: Marlowe Shores, PA-C (to f/u with Izora Ribas, MD)   PT End of Session - 12/28/21 1701     Visit Number 2    Number of Visits 17    Date for PT Re-Evaluation 02/07/22    Authorization Type Medicaid [employer] 2023 (?auth required after 12 visits-combined)    PT Start Time 1405    PT Stop Time 1443    PT Time Calculation (min) 38 min    Equipment Utilized During Treatment Gait belt    Activity Tolerance Patient tolerated treatment well    Behavior During Therapy WFL for tasks assessed/performed              Past Medical History:  Diagnosis Date   Arthritis    Breast cancer ([place])    Family history of non-Hodgkin's lymphoma 06/01/2020   GERD (gastroesophageal reflux disease)    Hemorrhoids    Migraines    PCOS (polycystic ovarian syndrome)    PONV (postoperative nausea and vomiting)    Past Surgical History:  Procedure Laterality Date   APPLICATION OF CRANIAL NAVIGATION Left 11/10/2021   Procedure: APPLICATION OF CRANIAL NAVIGATION;  Surgeon: Consuella Lose, MD;  Location: [place];  Service: Neurosurgery;  Laterality: Left;   AXILLARY SENTINEL NODE BIOPSY Left 11/07/2020   Procedure: LEFT AXILLARY SENTINEL NODE BIOPSY;  Surgeon: [name], MD;  Location: [place];  Service: General;  Laterality: Left;   BREAST CYST EXCISION Right 09/22/2021   Procedure: Excision of right axillary excess soft tissue.;  Surgeon: [name], MD;  Location: [place];  Service: Plastics;  Laterality: Right;   BREAST RECONSTRUCTION WITH PLACEMENT OF TISSUE EXPANDER AND FLEX HD (ACELLULAR HYDRATED DERMIS) Bilateral 11/07/2020   Procedure: BILATERAL BREAST RECONSTRUCTION WITH PLACEMENT OF TISSUE EXPANDER AND FLEX HD (ACELLULAR HYDRATED DERMIS);  Surgeon: [name],  MD;  Location: [place];  Service: Plastics;  Laterality: Bilateral;   COSMETIC SURGERY     CRANIOTOMY Left 11/10/2021   Procedure: LT FRONTAL CRANIOTOMY TUMOR EXCISION;  Surgeon: Consuella Lose, MD;  Location: [place];  Service: Neurosurgery;  Laterality: Left;   DILATION AND CURETTAGE OF UTERUS     HEMORRHOID SURGERY     IR IMAGING GUIDED PORT INSERTION  06/15/2020   LIPOSUCTION WITH LIPOFILLING Bilateral 09/22/2021   Procedure: Fat grafting bilateral breast;  Surgeon: [name], MD;  Location: [place];  Service: Plastics;  Laterality: Bilateral;   MASS EXCISION Bilateral 04/25/2021   Procedure: excision of bilateral axillary breast tissue;  Surgeon: [name], MD;  Location: [place];  Service: Plastics;  Laterality: Bilateral;   PORT-A-CATH REMOVAL Right 11/07/2020   Procedure: REMOVAL PORT-A-CATH;  Surgeon: [name], MD;  Location: [place];  Service: General;  Laterality: Right;   REMOVAL OF BILATERAL TISSUE EXPANDERS WITH PLACEMENT OF BILATERAL BREAST IMPLANTS Bilateral 04/25/2021   Procedure: REMOVAL OF BILATERAL TISSUE EXPANDERS WITH PLACEMENT OF BILATERAL BREAST IMPLANTS;  Surgeon: [name], MD;  Location: [place];  Service: Plastics;  Laterality: Bilateral;   ROBOTIC ASSISTED TOTAL HYSTERECTOMY WITH BILATERAL SALPINGO OOPHERECTOMY Bilateral 03/14/2021   Procedure: XI ROBOTIC ASSISTED TOTAL HYSTERECTOMY WITH BILATERAL SALPINGO OOPHORECTOMY;  Surgeon: Everitt Amber, MD;  Location: [facility];  Service: Gynecology;  Laterality: Bilateral;   TOTAL MASTECTOMY Bilateral 11/07/2020  Procedure: BILATERAL MASTECTOMY;  Surgeon: [name], MD;  Location: [place];  Service: General;  Laterality: Bilateral;   Patient Active Problem List   Diagnosis Date Noted   Brain tumor ([place]) 11/08/2021   Metastatic cancer to brain ([place]) 11/08/2021   Cerebral edema ([place]) 11/08/2021   Complex partial seizure ([place]) 11/08/2021   Brain  mass 11/07/2021   Right sided weakness 11/07/2021   IDA (iron deficiency anemia) 07/18/2021   Cholelithiasis 03/14/2021   Peripheral neuropathy due to chemotherapy ([place]) 10/20/2020   Morbid obesity with BMI of 40.0-44.9, adult ([place]) 07/19/2020   BRCA1 gene mutation positive 07/04/2020   Genetic testing 06/28/2020   Family history of non-Hodgkin's lymphoma 06/01/2020   Malignant neoplasm of upper-outer quadrant of left breast in female, estrogen receptor positive ([place]) 05/26/2020    ONSET DATE: 12/05/2021 (MD referral)  REFERRING DIAG: D49.6 (ICD-10-CM) - Neoplasm of unspecified behavior of brain  THERAPY DIAG:  Other lack of coordination  Unsteadiness on feet  Aftercare following surgery for neoplasm  Other abnormalities of gait and mobility  Muscle weakness (generalized)  Abnormal posture  Rationale for Evaluation and Treatment Rehabilitation  SUBJECTIVE:                                                                                                                                                                                              SUBJECTIVE STATEMENT: Patient reports she is not using her AFO at home due to not feeling like she needs it, but she uses it outdoors for safety. Pt accompanied by: self  PERTINENT HISTORY: metastatic breast cancer, brain tumor s/p excision with craniotomy; GERD, morbid obesity.  Admitted to rehab ([facility]) 11/17/21 and d/c home 12/09/2021  PAIN:  Are you having pain? No  PRECAUTIONS: Fall and Other: wearing R GRAFO, no driving  FALLS: Has patient fallen in last 6 months? 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 Now s/p L frontal crani tumor excision 5/19.  COGNITION: Overall cognitive status: Within functional limits for tasks assessed   SENSATION:  Just starting to get sensation back on bottom  of R foot Light touch: WFL  COORDINATION: Decreased coordination RLE-needs assist of LLE or UEs to place RLE   EDEMA:  Noted in RLE-pt reports   MUSCLE TONE: RLE: Hypotonic   POSTURE: rounded shoulders  LOWER EXTREMITY ROM:     Passive  Right Eval Left Eval  Hip flexion    Hip extension    Hip abduction    Hip adduction    Hip internal rotation    Hip external rotation    Knee flexion    Knee extension    Ankle dorsiflexion -10 from neutral   Ankle plantarflexion    Ankle inversion    Ankle eversion     (Blank rows = not tested)  LOWER EXTREMITY MMT:    MMT Right Eval Left Eval  Hip flexion 3-/5 4/5  Hip extension    Hip abduction 3+/5 4/5  Hip adduction 3+/5 4/5  Hip internal rotation    Hip external rotation    Knee flexion 3-/5 4+/5  Knee extension 3-/5 4+/5  Ankle dorsiflexion trace 5/5  Ankle plantarflexion    Ankle inversion    Ankle eversion    (Blank rows = not tested)   TRANSFERS: Assistive device utilized: Environmental consultant - 2 wheeled  Sit to stand: SBA Stand to sit: SBA  GAIT: Gait pattern: step through pattern, decreased step length- Left, decreased stance time- Right, decreased ankle dorsiflexion- Right, and genu recurvatum- Right Distance walked: 60 ft  Assistive device utilized: Walker - 2 wheeled and R AFO Level of assistance: SBA Comments: Pt is able to don R AFO  FUNCTIONAL TESTs:  5 times sit to stand: 29.13 Timed up and go (TUG): 42.62 Gait velocity:  23.87 sec = 1.37 ft/sec 5x sit<>stand performed with UE  support  PATIENT SURVEYS:  FOTO N/T  TREATMENT Patient's gait assessed without AFO, agree she doe snot need to use it when indoors, but recommend using it when out of the house for safety due to fatigue. BERG assessed-41/56 Practiced forward weight shifts over RLE without hyperextension at parallel bars, difficutly Wbing with R knee control.  PATIENT EDUCATION: Education details: PT POC, Eval results, initial HEP Person educated: Patient Education method: Explanation, Demonstration, and Handouts Education comprehension: verbalized understanding and returned demonstration   HOME EXERCISE PROGRAM: Access Code: [email or web address] URL: [email or web address] Date: 12/13/2021 Prepared by: [facility]  Exercises - Seated Heel Slide  - 1-2 x daily - 7 x weekly - 1 sets - 5-10 reps - 10-15 sec hold - Sit to Stand with Counter Support  - 1-2 x daily - 7 x weekly - 1-2 sets - 5 reps - Seated Gluteal Sets  - 1-2 x daily - 7 x weekly - 1-2 sets - 10 reps - 3 sec hold    GOALS: Goals reviewed with patient? Yes  SHORT TERM GOALS: Target date: 01/10/2022  Pt will be independent with HEP for improved strength, balance, gait. Baseline:  Initial HEP from CIR Goal status: ongoing  2.  Pt will improve 5x sit<>stand to less than or equal to 20 sec to demonstrate improved functional strength and transfer efficiency. Baseline: 29.13 sec with BUE support (scores >15 sec indicate fall risk) Goal status: INITIAL  3.  Berg score to be assessed with goal to be written as appropriate. Baseline: Pt at increased fall risk per 5x sit<>stand and TUG Goal status: INITIAL   LONG TERM GOALS: Target date: 02/07/2022  Pt will be independent with HEP for  improved strength, balance, transfers, and gait. Baseline:  Initial HEP from CIR Goal status: INITIAL  2.  Pt will improve 5x sit<>stand to less than or equal to 15 sec to demonstrate improved functional strength  and transfer efficiency. Baseline: 29.13 sec with BUE support (scores >15 sec indicate fall risk) Goal status: INITIAL  3.  Pt will improve TUG score to less than or equal to 25 sec for decreased fall risk. Baseline: 42.62 sec (indicates increased fall risk and difficulty with ADLs in home) Goal status: INITIAL  4.  Pt will improve gait velocity to at least 1.8 ft/sec for improved gait efficiency and safety. Baseline: 1.37 ft/sec (1.31-2.62 indicates increased limited community ambulator) Goal status: INITIAL  5.  FOTO score to be assessed and score to improve to predicted level at d/c, indicating improved overall functional mobility. Baseline: not assessed due to time constraints at eval Goal status: INITIAL  ASSESSMENT:  CLINICAL IMPRESSION: Patient reports she is not using AFO at home. Treatment focused on assessment as well as NM re-ed to RLE for improved R knee control.  OBJECTIVE IMPAIRMENTS Abnormal gait, decreased balance, decreased mobility, difficulty walking, decreased ROM, decreased strength, impaired flexibility, and impaired tone.   ACTIVITY LIMITATIONS standing, transfers, locomotion level, and caring for others  PARTICIPATION LIMITATIONS: meal prep, driving, shopping, community activity, and occupation  PERSONAL FACTORS 3+ comorbidities: see above for PMH  are also affecting patient's functional outcome.   REHAB POTENTIAL: Good  CLINICAL DECISION MAKING: Evolving/moderate complexity  EVALUATION COMPLEXITY: Moderate  PLAN: PT FREQUENCY: 2x/week  PT DURATION: 8 weeks plus eval  PLANNED INTERVENTIONS: Therapeutic exercises, Therapeutic activity, Neuromuscular re-education, Balance training, Gait training, Patient/Family education, Joint mobilization, Orthotic/Fit training, DME instructions, Aquatic Therapy, and Manual therapy  PLAN FOR NEXT SESSION:  Review HEP and add to HEP for strength and balance.  Work on R ankle range/flexibility, Acupuncturist.   Marcelina Morel, DPT 12/28/2021, 5:03 PM    [facility]

## 2021-12-28 NOTE — Telephone Encounter (Signed)
Pt called and reports she has a "moveable knot" to her left breast. Pt reports she had dbl mastectomy with reconstruction and just found the lump yesterday. Pt denies pain/swelling/erythema. She sees Wilber Bihari, [place] 7/11 for SCP visit and states she can wait until then for eval but knows to call if she develops any of there above sx before her appt.

## 2021-12-28 NOTE — Telephone Encounter (Signed)
Pt is calling in stating that she found something in her breast and would not elaborate on it.  Pt would like to have a call back to discuss.

## 2021-12-29 NOTE — Telephone Encounter (Signed)
Called patient to discuss, reports she noticed a bump on her breast and was concerned about this with her history of mastectomies and implant-based reconstruction.  She reports she is scheduled to see oncology on Tuesday.  She would like to discuss with them and if necessary then see [place].  I discussed with her we would be happy to see her at any point if oncology feels like a follow-up with [place] is warranted.  Recommend calling with any further questions or concerns.

## 2022-01-01 ENCOUNTER — Ambulatory Visit: Payer: Medicaid Other | Admitting: Nurse Practitioner

## 2022-01-02 ENCOUNTER — Other Ambulatory Visit: Payer: Self-pay

## 2022-01-02 ENCOUNTER — Encounter: Payer: Self-pay | Admitting: *Deleted

## 2022-01-02 ENCOUNTER — Inpatient Hospital Stay (HOSPITAL_BASED_OUTPATIENT_CLINIC_OR_DEPARTMENT_OTHER): Payer: Medicaid Other | Admitting: Adult Health

## 2022-01-02 ENCOUNTER — Telehealth: Payer: Self-pay

## 2022-01-02 VITALS — BP 142/94 | HR 102 | Temp 97.7°F | Resp 18 | Ht 71.0 in | Wt 298.6 lb

## 2022-01-02 DIAGNOSIS — Z809 Family history of malignant neoplasm, unspecified: Secondary | ICD-10-CM | POA: Diagnosis not present

## 2022-01-02 DIAGNOSIS — N63 Unspecified lump in unspecified breast: Secondary | ICD-10-CM | POA: Diagnosis not present

## 2022-01-02 DIAGNOSIS — C50412 Malignant neoplasm of upper-outer quadrant of left female breast: Secondary | ICD-10-CM | POA: Diagnosis present

## 2022-01-02 DIAGNOSIS — C7931 Secondary malignant neoplasm of brain: Secondary | ICD-10-CM

## 2022-01-02 DIAGNOSIS — Z170 Estrogen receptor positive status [ER+]: Secondary | ICD-10-CM

## 2022-01-02 DIAGNOSIS — Z9013 Acquired absence of bilateral breasts and nipples: Secondary | ICD-10-CM | POA: Diagnosis not present

## 2022-01-02 DIAGNOSIS — Z806 Family history of leukemia: Secondary | ICD-10-CM | POA: Diagnosis not present

## 2022-01-02 DIAGNOSIS — Z87891 Personal history of nicotine dependence: Secondary | ICD-10-CM | POA: Diagnosis not present

## 2022-01-02 NOTE — Progress Notes (Signed)
Referral for primary care placed Appt is Sept 28 at 8am with Dola Factor at [place] at [facility]. Pt been made aware.

## 2022-01-02 NOTE — Telephone Encounter (Signed)
Pt is scheduled for br [place] at [facility] for 7/17 at 1240. She is aware of appt and time. Knows to call with any questions or concerns.

## 2022-01-02 NOTE — Progress Notes (Unsigned)
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  Variant of uncertain significance in MSH3 at c.2724A>G (Silent).  No other pathogenic or uncertain variants were reported in the [facility] Multi-Cancer Panel.  The report date is June 16, 2020.    The variant of uncertain significance (VUS) in MSH3 at c.2724A>G (Silent) has been reclassified to likely benign.  The change in variant classification was made as a result of re-review of evidence in light of new variant interpretation guidelines and/or new information. The amended report date is January 10, 2021.   The Multi-Cancer Panel offered by [facility] includes sequencing and/or deletion duplication testing of the following 85 genes: AIP, ALK, APC, ATM, AXIN2,BAP1,  BARD1, BLM, BMPR1A, BRCA1, BRCA2, BRIP1, CASR, CDC73, CDH1, CDK4, CDKN1B, CDKN1C, CDKN2A (p14ARF), CDKN2A (p16INK4a), CEBPA, CHEK2, CTNNA1, DICER1, DIS3L2, EGFR (c.2369C>T, p.Thr790Met variant only), EPCAM (Deletion/duplication testing only), FH, FLCN, GATA2, GPC3, GREM1 (Promoter region deletion/duplication testing only), HOXB13 (c.251G>A, p.Gly84Glu), HRAS, KIT, MAX, MEN1, MET, MITF (c.952G>A, p.Glu318Lys variant only), MLH1, MSH2, MSH3, MSH6, MUTYH, NBN, NF1, NF2, NTHL1, PALB2, PDGFRA, PHOX2B, PMS2, POLD1, POLE, POT1, PRKAR1A, PTCH1, PTEN, RAD50, RAD51C, RAD51D, RB1, RECQL4, RET, RNF43, RUNX1, SDHAF2, SDHA (sequence changes only), SDHB, SDHC, SDHD, SMAD4, SMARCA4, SMARCB1, SMARCE1, STK11, SUFU, TERC, TERT, TMEM127, TP53,  TSC1, TSC2, VHL, WRN and WT1.    10/20/2020 - 10/20/2020 Chemotherapy    Patient is on Treatment Plan: BREAST  DOCETAXEL + CARBOPLATIN + TRASTUZUMAB + PERTUZUMAB  (TCHP) Q21D       10/20/2020 - 06/23/2021 Chemotherapy   Patient is on Treatment Plan : BREAST Trastuzumab q21d     11/10/2021 - 11/10/2021 Radiation Therapy   Site Technique Total Dose (Gy) Dose per Fx (Gy) Completed Fx Beam Energies  Brain: Brain [id number] IMRT 18/18 18 1/1 6XFFF       INTERVAL HISTORY:  Sheryl Mcmillan review her survivorship care plan detailing her treatment course for breast cancer, as well as monitoring long-term side effects of that treatment, education regarding health maintenance, screening, and overall wellness and health promotion.     Overall, Ms. HWurthreports feeling quite well.  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  She is status postresection and radiation.  She has MRI scheduled in August of this year.  She has no recent metastatic disease noted elsewhere on previous full body imaging.  She did bring in a picture today of the left breast nodule that she has been palpating previously.  REVIEW OF SYSTEMS:  Review of Systems  Constitutional:  Positive for fatigue. Negative for appetite change, chills, fever and unexpected weight change.  HENT:   Negative for hearing loss, lump/mass and trouble swallowing.   Eyes:  Negative for eye problems and icterus.  Respiratory:  Negative for chest tightness, cough and shortness of breath.   Cardiovascular:  Negative for chest pain, leg swelling and palpitations.  Gastrointestinal:  Negative for abdominal distention, abdominal pain, constipation, diarrhea, nausea and vomiting.  Endocrine: Negative for hot flashes.  Genitourinary:  Negative for difficulty urinating.   Musculoskeletal:  Negative for arthralgias.  Skin:  Negative for itching and rash.  Neurological:  Negative for dizziness,  extremity weakness, headaches and numbness.  Hematological:  Negative for adenopathy. Does not bruise/bleed easily.  Psychiatric/Behavioral:  Negative for depression. The patient is not nervous/anxious.   Breast: Denies any new nodularity, masses, tenderness, nipple changes, or nipple discharge.      ONCOLOGY TREATMENT TEAM:  1. Surgeon:  Dr. Donne Hazel at [facility] 2. Medical Oncologist: Dr. Lindi Adie 3. Radiation Oncologist: Dr. Sondra Come    PAST MEDICAL/SURGICAL HISTORY:  Past Medical History:  Diagnosis Date   Arthritis    Breast cancer [facility])    Family history of non-Hodgkin's lymphoma 06/01/2020   GERD (gastroesophageal reflux disease)    Hemorrhoids    Migraines    PCOS (polycystic ovarian syndrome)    PONV (postoperative nausea and vomiting)    Past Surgical History:  Procedure Laterality Date   APPLICATION OF CRANIAL NAVIGATION Left 11/10/2021   Procedure: APPLICATION OF CRANIAL NAVIGATION;  Surgeon: Consuella Lose, MD;  Location: [place];  Service: Neurosurgery;  Laterality: Left;   AXILLARY SENTINEL NODE BIOPSY Left 11/07/2020   Procedure: LEFT AXILLARY SENTINEL NODE BIOPSY;  Surgeon: [name], MD;  Location: [place];  Service: General;  Laterality: Left;   BREAST CYST EXCISION Right 09/22/2021   Procedure: Excision of right axillary excess soft tissue.;  Surgeon: [name], MD;  Location: [place];  Service: Plastics;  Laterality: Right;   BREAST RECONSTRUCTION WITH PLACEMENT OF TISSUE EXPANDER AND FLEX HD (ACELLULAR HYDRATED DERMIS) Bilateral 11/07/2020   Procedure: BILATERAL BREAST RECONSTRUCTION WITH PLACEMENT OF TISSUE EXPANDER AND FLEX HD (ACELLULAR HYDRATED DERMIS);  Surgeon: [name], MD;  Location: [place];  Service: Plastics;  Laterality: Bilateral;   COSMETIC SURGERY     CRANIOTOMY Left 11/10/2021   Procedure: LT FRONTAL CRANIOTOMY TUMOR EXCISION;  Surgeon: Consuella Lose, MD;  Location: [place];  Service:  Neurosurgery;  Laterality: Left;   DILATION AND CURETTAGE OF UTERUS     HEMORRHOID SURGERY     IR IMAGING GUIDED PORT INSERTION  06/15/2020   LIPOSUCTION WITH LIPOFILLING Bilateral 09/22/2021   Procedure: Fat grafting bilateral breast;  Surgeon: [name], MD;  Location: [place];  Service: Plastics;  Laterality: Bilateral;   MASS EXCISION Bilateral 04/25/2021   Procedure: excision of bilateral axillary breast tissue;  Surgeon: [name], MD;  Location: [place];  Service: Plastics;  Laterality: Bilateral;   PORT-A-CATH REMOVAL Right 11/07/2020   Procedure: REMOVAL PORT-A-CATH;  Surgeon: [name], MD;  Location: [place];  Service: General;  Laterality: Right;   REMOVAL OF BILATERAL TISSUE EXPANDERS WITH PLACEMENT OF BILATERAL BREAST IMPLANTS Bilateral 04/25/2021   Procedure: REMOVAL OF BILATERAL TISSUE EXPANDERS WITH PLACEMENT OF BILATERAL BREAST IMPLANTS;  Surgeon: [name], MD;  Location: [place];  Service: Plastics;  Laterality: Bilateral;   ROBOTIC ASSISTED TOTAL HYSTERECTOMY WITH BILATERAL SALPINGO OOPHERECTOMY Bilateral 03/14/2021   Procedure: XI ROBOTIC ASSISTED TOTAL HYSTERECTOMY WITH BILATERAL SALPINGO OOPHORECTOMY;  Surgeon: Everitt Amber, MD;  Location: [facility];  Service: Gynecology;  Laterality: Bilateral;   TOTAL MASTECTOMY Bilateral 11/07/2020   Procedure: BILATERAL MASTECTOMY;  Surgeon: [name], MD;  Location: [place];  Service: General;  Laterality: Bilateral;     ALLERGIES:  Allergies  Allergen Reactions   Carboplatin Hives and Itching    Carboplatin reaction after dose #5 requiring Benadryl, Solumedrol, epinephrine, Claritin.   Other Hives  Baking Soda   Wound Dressing Adhesive Hives    Rips skin, prefers paper tape     CURRENT MEDICATIONS:  Outpatient Encounter Medications as of 01/02/2022  Medication Sig   acetaminophen (TYLENOL) 325 MG tablet Take 1-2 tablets (325-650 mg total) by  mouth every 4 (four) hours as needed for mild pain.   methocarbamol (ROBAXIN) 500 MG tablet Take 1 tablet (500 mg total) by mouth every 6 (six) hours as needed for muscle spasms.   Multiple Vitamins-Minerals (ONE-A-DAY WOMENS PO) Take 1 tablet by mouth daily.   omeprazole (PRILOSEC) 20 MG capsule Take 40 mg by mouth daily as needed. (Patient not taking: Reported on 01/02/2022)   No facility-administered encounter medications on file as of 01/02/2022.     ONCOLOGIC FAMILY HISTORY:  Family History  Problem Relation Age of Onset   Hypertension Mother    Diabetes Mother    Cancer Mother 26       unknown primary   Other Mother        brain tumor; dx 28s   Non-Hodgkin's lymphoma Brother 47   Other Maternal Uncle 67       brain tumor   Breast cancer Other        MGM's niece; dx late 72s   Pancreatic cancer Neg Hx    Colon cancer Neg Hx    Endometrial cancer Neg Hx    Prostate cancer Neg Hx    Ovarian cancer Neg Hx       SOCIAL HISTORY:  Social History   Socioeconomic History   Marital status: Single    Spouse name: Not on file   Number of children: 1   Years of education: Not on file   Highest education level: Some college, no degree  Occupational History   Not on file  Tobacco Use   Smoking status: Former    Packs/day: 0.50    Years: 10.00    Total pack years: 5.00    Types: Cigarettes    Quit date: 08/23/2020    Years since quitting: 1.3   Smokeless tobacco: Never  Vaping Use   Vaping Use: Never used  Substance and Sexual Activity   Alcohol use: Not Currently    Comment: rare   Drug use: Yes    Types: Marijuana    Comment: Last use 11/07/21   Sexual activity: Yes    Birth control/protection: Surgical    Comment: Hysterectomy  Other Topics Concern   Not on file  Social History Narrative   Not on file   Social Determinants of Health   Financial Resource Strain: Not on file  Food Insecurity: Not on file  Transportation Needs: No Transportation Needs  (05/05/2020)   PRAPARE - Hydrologist (Medical): No    Lack of Transportation (Non-Medical): No  Physical Activity: Not on file  Stress: Not on file  Social Connections: Not on file  Intimate Partner Violence: Not on file     OBSERVATIONS/OBJECTIVE: BP (!) 142/94 (BP Location: Left Arm, Patient Position: Sitting)   Pulse (!) 102   Temp 97.7 F (36.5 C)   Resp 18   Ht 5' 11"  (1.803 m)   Wt 298 lb 9.6 oz (135.4 kg)   LMP 06/08/2020   SpO2 94%   BMI 41.65 kg/m  GENERAL: Patient is a well appearing female in no acute distress HEENT:  Sclerae anicteric.  Oropharynx clear and moist. No ulcerations or evidence of oropharyngeal candidiasis. Neck is supple.  NODES:  No cervical, supraclavicular, or axillary lymphadenopathy palpated.  BREAST EXAM: Unable to palpate left breast nodule. LUNGS:  Clear to auscultation bilaterally.  No wheezes or rhonchi. HEART:  Regular rate and rhythm. No murmur appreciated. ABDOMEN:  Soft, nontender.  Positive, normoactive bowel sounds. No organomegaly palpated. MSK:  No focal spinal tenderness to palpation. Full range of motion bilaterally in the upper extremities. EXTREMITIES:  No peripheral edema.   SKIN:  Clear with no obvious rashes or skin changes. No nail dyscrasia. NEURO:  Nonfocal. Well oriented.  Appropriate affect.   LABORATORY DATA:  None for this visit.  DIAGNOSTIC IMAGING:  None for this visit.      ASSESSMENT AND PLAN:  Sheryl Mcmillan is a pleasant 46 y.o. female with Stage 4 invasive ductal carcinoma, ER-/PR-/HER2+, diagnosed initially in December 2021 treated with neoadjuvant chemotherapy, bilateral mastectomies, maintenance Herceptin, resection of brain met, and radiation.She presents to the Survivorship Clinic for our initial meeting and routine follow-up post-completion of treatment for breast cancer.    1. Stage IV breast cancer: She has no clinical or radiographic signs of breast cancer progression  today.  She will proceed with MRI brain on August 18 as ordered and repeat PET scan in November 2023.  Today, a comprehensive survivorship care plan and treatment summary was reviewed with the patient today detailing her breast cancer diagnosis, treatment course, potential late/long-term effects of treatment, appropriate follow-up care with recommendations for the future, and patient education resources.  A copy of this summary, along with a letter will be sent to the patient's primary care provider via mail/fax/In Basket message after today's visit.    2.  Left breast nodule: She is status post mastectomy and reconstruction.  I have ordered an ultrasound to be completed to evaluate this area to identify any potential changes that could have led to the breast change.  3. Bone health:  She was given education on specific activities to promote bone health.  4. Cancer screening:  Due to Sheryl Mcmillan's history and her age, she should receive screening for skin cancers, colon cancer.  The information and recommendations are listed on the patient's comprehensive care plan/treatment summary and were reviewed in detail with the patient.    5. Health maintenance and wellness promotion: Sheryl Mcmillan was encouraged to consume 5-7 servings of fruits and vegetables per day. We reviewed the "Nutrition Rainbow" handout.  She was also encouraged to engage in moderate to vigorous exercise for 30 minutes per day most days of the week. We discussed the [employer] fitness program, which is designed for cancer survivors to help them become more physically fit after cancer treatments.  She was instructed to limit her alcohol consumption and continue to abstain from tobacco use.     6. Support services/counseling: It is not uncommon for this period of the patient's cancer care trajectory to be one of many emotions and stressors.   She was given information regarding our available services and encouraged to contact me with any  questions or for help enrolling in any of our support group/programs.    Follow up instructions:    -Return to cancer center 04/2022  -PET scan in 04/2022 -Brain MRI 01/2022 -Ultrasound of breast -She is welcome to return back to the Survivorship Clinic at any time; no additional follow-up needed at this time.  -Consider referral back to survivorship as a long-term survivor for continued surveillance  The patient was provided an opportunity to ask questions and all were answered. The patient agreed  with the plan and demonstrated an understanding of the instructions.   Total encounter time:30 minutes*in face-to-face visit time, chart review, lab review, care coordination, order entry, and documentation of the encounter time.    Wilber Bihari, NP 01/03/22 2:44 PM Medical Oncology and Hematology [facility] [place], [place] [address] Tel. [phone]    Fax. [phone]  *Total Encounter Time as defined by the Centers for Medicare and Medicaid Services includes, in addition to the face-to-face time of a patient visit (documented in the note above) non-face-to-face time: obtaining and reviewing outside history, ordering and reviewing medications, tests or procedures, care coordination (communications with other health care professionals or caregivers) and documentation in the medical record.

## 2022-01-03 ENCOUNTER — Ambulatory Visit: Payer: Medicaid Other

## 2022-01-03 ENCOUNTER — Encounter: Payer: Self-pay | Admitting: Hematology and Oncology

## 2022-01-03 DIAGNOSIS — R278 Other lack of coordination: Secondary | ICD-10-CM

## 2022-01-03 DIAGNOSIS — R2681 Unsteadiness on feet: Secondary | ICD-10-CM

## 2022-01-03 DIAGNOSIS — R293 Abnormal posture: Secondary | ICD-10-CM

## 2022-01-03 DIAGNOSIS — M6281 Muscle weakness (generalized): Secondary | ICD-10-CM

## 2022-01-03 DIAGNOSIS — Z483 Aftercare following surgery for neoplasm: Secondary | ICD-10-CM

## 2022-01-03 DIAGNOSIS — R2689 Other abnormalities of gait and mobility: Secondary | ICD-10-CM

## 2022-01-03 NOTE — Therapy (Signed)
OUTPATIENT PHYSICAL THERAPY NEURO TREATMENT   Patient Name: Sheryl Mcmillan MRN: [id number] DOB:1975/12/11, 46 y.o., female Today's Date: 01/03/2022   PCP: Windell Hummingbird, PA-C REFERRING PROVIDER: Marlowe Shores, PA-C (to f/u with Izora Ribas, MD)   PT End of Session - 01/03/22 1404     Visit Number 3    Number of Visits 17    Date for PT Re-Evaluation 02/07/22    Authorization Type Medicaid [employer] 2023 (?auth required after 12 visits-combined)    PT Start Time 1405    PT Stop Time 1445    PT Time Calculation (min) 40 min    Equipment Utilized During Treatment --    Activity Tolerance Patient tolerated treatment well    Behavior During Therapy [facility] for tasks assessed/performed               Past Medical History:  Diagnosis Date   Arthritis    Breast cancer ([place])    Family history of non-Hodgkin's lymphoma 06/01/2020   GERD (gastroesophageal reflux disease)    Hemorrhoids    Migraines    PCOS (polycystic ovarian syndrome)    PONV (postoperative nausea and vomiting)    Past Surgical History:  Procedure Laterality Date   APPLICATION OF CRANIAL NAVIGATION Left 11/10/2021   Procedure: APPLICATION OF CRANIAL NAVIGATION;  Surgeon: Consuella Lose, MD;  Location: [place];  Service: Neurosurgery;  Laterality: Left;   AXILLARY SENTINEL NODE BIOPSY Left 11/07/2020   Procedure: LEFT AXILLARY SENTINEL NODE BIOPSY;  Surgeon: [name], MD;  Location: [place];  Service: General;  Laterality: Left;   BREAST CYST EXCISION Right 09/22/2021   Procedure: Excision of right axillary excess soft tissue.;  Surgeon: [name], MD;  Location: [place];  Service: Plastics;  Laterality: Right;   BREAST RECONSTRUCTION WITH PLACEMENT OF TISSUE EXPANDER AND FLEX HD (ACELLULAR HYDRATED DERMIS) Bilateral 11/07/2020   Procedure: BILATERAL BREAST RECONSTRUCTION WITH PLACEMENT OF TISSUE EXPANDER AND FLEX HD (ACELLULAR HYDRATED DERMIS);  Surgeon: [name], MD;   Location: [place];  Service: Plastics;  Laterality: Bilateral;   COSMETIC SURGERY     CRANIOTOMY Left 11/10/2021   Procedure: LT FRONTAL CRANIOTOMY TUMOR EXCISION;  Surgeon: Consuella Lose, MD;  Location: [place];  Service: Neurosurgery;  Laterality: Left;   DILATION AND CURETTAGE OF UTERUS     HEMORRHOID SURGERY     IR IMAGING GUIDED PORT INSERTION  06/15/2020   LIPOSUCTION WITH LIPOFILLING Bilateral 09/22/2021   Procedure: Fat grafting bilateral breast;  Surgeon: [name], MD;  Location: [place];  Service: Plastics;  Laterality: Bilateral;   MASS EXCISION Bilateral 04/25/2021   Procedure: excision of bilateral axillary breast tissue;  Surgeon: [name], MD;  Location: [place];  Service: Plastics;  Laterality: Bilateral;   PORT-A-CATH REMOVAL Right 11/07/2020   Procedure: REMOVAL PORT-A-CATH;  Surgeon: [name], MD;  Location: [place];  Service: General;  Laterality: Right;   REMOVAL OF BILATERAL TISSUE EXPANDERS WITH PLACEMENT OF BILATERAL BREAST IMPLANTS Bilateral 04/25/2021   Procedure: REMOVAL OF BILATERAL TISSUE EXPANDERS WITH PLACEMENT OF BILATERAL BREAST IMPLANTS;  Surgeon: [name], MD;  Location: [place];  Service: Plastics;  Laterality: Bilateral;   ROBOTIC ASSISTED TOTAL HYSTERECTOMY WITH BILATERAL SALPINGO OOPHERECTOMY Bilateral 03/14/2021   Procedure: XI ROBOTIC ASSISTED TOTAL HYSTERECTOMY WITH BILATERAL SALPINGO OOPHORECTOMY;  Surgeon: Everitt Amber, MD;  Location: [facility];  Service: Gynecology;  Laterality: Bilateral;   TOTAL MASTECTOMY Bilateral 11/07/2020  Procedure: BILATERAL MASTECTOMY;  Surgeon: [name], MD;  Location: [place];  Service: General;  Laterality: Bilateral;   Patient Active Problem List   Diagnosis Date Noted   Brain tumor ([place]) 11/08/2021   Metastatic cancer to brain ([place]) 11/08/2021   Cerebral edema ([place]) 11/08/2021   Complex partial seizure ([place]) 11/08/2021   Brain mass  11/07/2021   Right sided weakness 11/07/2021   IDA (iron deficiency anemia) 07/18/2021   Cholelithiasis 03/14/2021   Peripheral neuropathy due to chemotherapy ([place]) 10/20/2020   Morbid obesity with BMI of 40.0-44.9, adult ([place]) 07/19/2020   BRCA1 gene mutation positive 07/04/2020   Genetic testing 06/28/2020   Family history of non-Hodgkin's lymphoma 06/01/2020   Malignant neoplasm of upper-outer quadrant of left breast in female, estrogen receptor positive ([place]) 05/26/2020    ONSET DATE: 12/05/2021 (MD referral)  REFERRING DIAG: D49.6 (ICD-10-CM) - Neoplasm of unspecified behavior of brain  THERAPY DIAG:  Unsteadiness on feet  Other lack of coordination  Other abnormalities of gait and mobility  Aftercare following surgery for neoplasm  Muscle weakness (generalized)  Abnormal posture  Rationale for Evaluation and Treatment Rehabilitation  SUBJECTIVE:                                                                                                                                                                                              SUBJECTIVE STATEMENT: Patient reports no pain or falls.  Pt accompanied by: self  PERTINENT HISTORY: metastatic breast cancer, brain tumor s/p excision with craniotomy; GERD, morbid obesity.  Admitted to rehab ([facility]) 11/17/21 and d/c home 12/09/2021  PAIN:  Are you having pain? No  PRECAUTIONS: Fall and Other: wearing R GRAFO, no driving  FALLS: Has patient fallen in last 6 months? 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 Now s/p L frontal crani tumor excision 5/19.  COGNITION: Overall cognitive status: Within functional limits for tasks assessed   SENSATION:  Just starting to get sensation back on bottom of R foot Light touch: WFL  COORDINATION: Decreased coordination RLE-needs assist of LLE or UEs to place  RLE   EDEMA:  Noted in RLE-pt reports   MUSCLE TONE: RLE: Hypotonic   POSTURE: rounded shoulders  LOWER EXTREMITY ROM:     Passive  Right Eval Left Eval  Hip flexion    Hip extension    Hip abduction    Hip adduction    Hip internal rotation    Hip external rotation    Knee flexion    Knee extension    Ankle dorsiflexion -10 from neutral   Ankle plantarflexion    Ankle inversion    Ankle eversion     (Blank rows = not tested)  LOWER EXTREMITY MMT:    MMT Right Eval Left Eval  Hip flexion 3-/5 4/5  Hip extension    Hip abduction 3+/5 4/5  Hip adduction 3+/5 4/5  Hip internal rotation    Hip external rotation    Knee flexion 3-/5 4+/5  Knee extension 3-/5 4+/5  Ankle dorsiflexion trace 5/5  Ankle plantarflexion    Ankle inversion    Ankle eversion    (Blank rows = not tested)   TRANSFERS: Assistive device utilized: Environmental consultant - 2 wheeled  Sit to stand: SBA Stand to sit: SBA  GAIT: Gait pattern: step through pattern, decreased step length- Left, decreased stance time- Right, decreased ankle dorsiflexion- Right, and genu recurvatum- Right Distance walked: 60 ft  Assistive device utilized: Walker - 2 wheeled and R AFO Level of assistance: SBA Comments: Pt is able to don R AFO  FUNCTIONAL TESTs:  5 times sit to stand: 29.13 Timed up and go (TUG): 42.62 Gait velocity:  23.87 sec = 1.37 ft/sec 5x sit<>stand performed with UE support  PATIENT SURVEYS:  FOTO N/T  TREATMENT 01/03/22 Nustep L3 x50mns Weight shifts in  // bars 2 way hip 2# 2x10 Standing marches in // bars 2# 2x10  LAQ 2# 2x10 RLE   Seated ankle rockers  Ankle TB  STS elevated mat 2x10    Patient's gait assessed without AFO, agree she doe snot need to use it when indoors, but recommend using it when out of the house for safety due to fatigue. BERG assessed-41/56 Practiced forward weight shifts over RLE without hyperextension at parallel bars, difficutly Wbing with R knee control.  PATIENT EDUCATION: Education details: PT POC, Eval results, initial HEP Person educated: Patient Education method: Explanation, Demonstration, and Handouts Education comprehension: verbalized understanding and returned demonstration   HOME EXERCISE PROGRAM: Access Code: [email or web address]: [email or web address] Date: 12/13/2021 [facility]  Exercises - Seated Heel Slide  - 1-2 x daily - 7 x weekly - 1 sets - 5-10 reps - 10-15 sec hold - Sit to Stand with Counter Support  - 1-2 x daily - 7 x weekly - 1-2 sets - 5 reps - Seated Gluteal Sets  - 1-2 x daily - 7 x weekly - 1-2 sets - 10 reps - 3 sec hold    GOALS: Goals reviewed with patient? Yes  SHORT TERM GOALS: Target date: 01/10/2022  Pt will be independent with HEP for improved strength, balance, gait. Baseline:  Initial HEP from CIR Goal status: ongoing  2.  Pt will improve 5x sit<>stand to less than or equal to 20 sec to demonstrate improved functional strength and transfer efficiency. Baseline: 29.13 sec with BUE support (scores >15 sec indicate fall risk) Goal status: INITIAL  3.  Berg score to be assessed with goal to be written as appropriate. Baseline: Pt at increased fall risk  per 5x sit<>stand and TUG Goal status: INITIAL   LONG TERM GOALS: Target date: 02/07/2022  Pt will be independent with HEP for improved strength, balance, transfers, and gait. Baseline:  Initial HEP from CIR Goal status: INITIAL  2.  Pt will  improve 5x sit<>stand to less than or equal to 15 sec to demonstrate improved functional strength and transfer efficiency. Baseline: 29.13 sec with BUE support (scores >15 sec indicate fall risk) Goal status: INITIAL  3.  Pt will improve TUG score to less than or equal to 25 sec for decreased fall risk. Baseline: 42.62 sec (indicates increased fall risk and difficulty with ADLs in home) Goal status: INITIAL  4.  Pt will improve gait velocity to at least 1.8 ft/sec for improved gait efficiency and safety. Baseline: 1.37 ft/sec (1.31-2.62 indicates increased limited community ambulator) Goal status: INITIAL  5.  FOTO score to be assessed and score to improve to predicted level at d/c, indicating improved overall functional mobility. Baseline: not assessed due to time constraints at eval Goal status: INITIAL  ASSESSMENT:  CLINICAL IMPRESSION: Patient reports no pain or falls, is doing well overall. Treatment focused on LE strengthening and weight shifting without brace on. She is able to extend her leg out which she was unable to do last week. She continues to have difficulty with controlling RLE foot placement. Attempted seated ankle rockers and ankle strengthening but patient is unable to do due to no motion or control in R ankle, deferred to a different exercise. She is able to complete STS with minA to hold R foot down from moving. Added in weighted ball to cue for equal weight shift equally through both LEs.    OBJECTIVE IMPAIRMENTS Abnormal gait, decreased balance, decreased mobility, difficulty walking, decreased ROM, decreased strength, impaired flexibility, and impaired tone.   ACTIVITY LIMITATIONS standing, transfers, locomotion level, and caring for others  PARTICIPATION LIMITATIONS: meal prep, driving, shopping, community activity, and occupation  PERSONAL FACTORS 3+ comorbidities: see above for PMH  are also affecting patient's functional outcome.   REHAB POTENTIAL:  Good  CLINICAL DECISION MAKING: Evolving/moderate complexity  EVALUATION COMPLEXITY: Moderate  PLAN: PT FREQUENCY: 2x/week  PT DURATION: 8 weeks plus eval  PLANNED INTERVENTIONS: Therapeutic exercises, Therapeutic activity, Neuromuscular re-education, Balance training, Gait training, Patient/Family education, Joint mobilization, Orthotic/Fit training, DME instructions, Aquatic Therapy, and Manual therapy  PLAN FOR NEXT SESSION:  Work on R ankle range/flexibility in standing, quad/hamstring strength, seated gastroc/HS stretch, side steps in // bars    Andris Baumann, DPT 01/03/2022, 2:49 PM    [place] [facility]

## 2022-01-05 ENCOUNTER — Encounter: Payer: Medicaid Other | Admitting: Registered Nurse

## 2022-01-05 ENCOUNTER — Ambulatory Visit: Payer: Medicaid Other | Admitting: Occupational Therapy

## 2022-01-05 ENCOUNTER — Ambulatory Visit: Payer: Medicaid Other

## 2022-01-05 DIAGNOSIS — Z483 Aftercare following surgery for neoplasm: Secondary | ICD-10-CM

## 2022-01-05 DIAGNOSIS — R278 Other lack of coordination: Secondary | ICD-10-CM

## 2022-01-05 DIAGNOSIS — R293 Abnormal posture: Secondary | ICD-10-CM

## 2022-01-05 DIAGNOSIS — M6281 Muscle weakness (generalized): Secondary | ICD-10-CM

## 2022-01-05 DIAGNOSIS — R2681 Unsteadiness on feet: Secondary | ICD-10-CM

## 2022-01-05 DIAGNOSIS — R2689 Other abnormalities of gait and mobility: Secondary | ICD-10-CM

## 2022-01-05 NOTE — Therapy (Signed)
OUTPATIENT PHYSICAL THERAPY NEURO TREATMENT   Patient Name: Sheryl Mcmillan MRN: [id number] DOB:[birth year], 46 y.o., female Today's Date: 01/05/2022   PCP: Windell Hummingbird, PA-C REFERRING PROVIDER: Marlowe Shores, PA-C (to f/u with Izora Ribas, MD)   PT End of Session - 01/05/22 0933     Visit Number 4    Number of Visits 17    Date for PT Re-Evaluation 02/07/22    Authorization Type Medicaid [employer] 2023 (?auth required after 12 visits-combined)    PT Start Time 0933    PT Stop Time 1017    PT Time Calculation (min) 44 min    Activity Tolerance Patient tolerated treatment well    Behavior During Therapy [facility] for tasks assessed/performed                Past Medical History:  Diagnosis Date   Arthritis    Breast cancer ([place])    Family history of non-Hodgkin's lymphoma 06/01/2020   GERD (gastroesophageal reflux disease)    Hemorrhoids    Migraines    PCOS (polycystic ovarian syndrome)    PONV (postoperative nausea and vomiting)    Past Surgical History:  Procedure Laterality Date   APPLICATION OF CRANIAL NAVIGATION Left 11/10/2021   Procedure: APPLICATION OF CRANIAL NAVIGATION;  Surgeon: Consuella Lose, MD;  Location: [place];  Service: Neurosurgery;  Laterality: Left;   AXILLARY SENTINEL NODE BIOPSY Left 11/07/2020   Procedure: LEFT AXILLARY SENTINEL NODE BIOPSY;  Surgeon: [name], MD;  Location: [place];  Service: General;  Laterality: Left;   BREAST CYST EXCISION Right 09/22/2021   Procedure: Excision of right axillary excess soft tissue.;  Surgeon: [name], MD;  Location: [place];  Service: Plastics;  Laterality: Right;   BREAST RECONSTRUCTION WITH PLACEMENT OF TISSUE EXPANDER AND FLEX HD (ACELLULAR HYDRATED DERMIS) Bilateral 11/07/2020   Procedure: BILATERAL BREAST RECONSTRUCTION WITH PLACEMENT OF TISSUE EXPANDER AND FLEX HD (ACELLULAR HYDRATED DERMIS);  Surgeon: [name], MD;  Location: [place];  Service: Plastics;   Laterality: Bilateral;   COSMETIC SURGERY     CRANIOTOMY Left 11/10/2021   Procedure: LT FRONTAL CRANIOTOMY TUMOR EXCISION;  Surgeon: Consuella Lose, MD;  Location: [place];  Service: Neurosurgery;  Laterality: Left;   DILATION AND CURETTAGE OF UTERUS     HEMORRHOID SURGERY     IR IMAGING GUIDED PORT INSERTION  06/15/2020   LIPOSUCTION WITH LIPOFILLING Bilateral 09/22/2021   Procedure: Fat grafting bilateral breast;  Surgeon: [name], MD;  Location: [place];  Service: Plastics;  Laterality: Bilateral;   MASS EXCISION Bilateral 04/25/2021   Procedure: excision of bilateral axillary breast tissue;  Surgeon: [name], MD;  Location: [place];  Service: Plastics;  Laterality: Bilateral;   PORT-A-CATH REMOVAL Right 11/07/2020   Procedure: REMOVAL PORT-A-CATH;  Surgeon: [name], MD;  Location: [place];  Service: General;  Laterality: Right;   REMOVAL OF BILATERAL TISSUE EXPANDERS WITH PLACEMENT OF BILATERAL BREAST IMPLANTS Bilateral 04/25/2021   Procedure: REMOVAL OF BILATERAL TISSUE EXPANDERS WITH PLACEMENT OF BILATERAL BREAST IMPLANTS;  Surgeon: [name], MD;  Location: [place];  Service: Plastics;  Laterality: Bilateral;   ROBOTIC ASSISTED TOTAL HYSTERECTOMY WITH BILATERAL SALPINGO OOPHERECTOMY Bilateral 03/14/2021   Procedure: XI ROBOTIC ASSISTED TOTAL HYSTERECTOMY WITH BILATERAL SALPINGO OOPHORECTOMY;  Surgeon: Everitt Amber, MD;  Location: [facility];  Service: Gynecology;  Laterality: Bilateral;   TOTAL MASTECTOMY Bilateral 11/07/2020   Procedure: BILATERAL MASTECTOMY;  Surgeon: [name],  MD;  Location: [place];  Service: General;  Laterality: Bilateral;   Patient Active Problem List   Diagnosis Date Noted   Brain tumor ([place]) 11/08/2021   Metastatic cancer to brain ([place]) 11/08/2021   Cerebral edema ([place]) 11/08/2021   Complex partial seizure ([place]) 11/08/2021   Brain mass 11/07/2021   Right sided weakness  11/07/2021   IDA (iron deficiency anemia) 07/18/2021   Cholelithiasis 03/14/2021   Peripheral neuropathy due to chemotherapy ([place]) 10/20/2020   Morbid obesity with BMI of 40.0-44.9, adult ([place]) 07/19/2020   BRCA1 gene mutation positive 07/04/2020   Genetic testing 06/28/2020   Family history of non-Hodgkin's lymphoma 06/01/2020   Malignant neoplasm of upper-outer quadrant of left breast in female, estrogen receptor positive ([place]) 05/26/2020    ONSET DATE: 12/05/2021 (MD referral)  REFERRING DIAG: D49.6 (ICD-10-CM) - Neoplasm of unspecified behavior of brain  THERAPY DIAG:  Unsteadiness on feet  Other lack of coordination  Other abnormalities of gait and mobility  Aftercare following surgery for neoplasm  Muscle weakness (generalized)  Abnormal posture  Rationale for Evaluation and Treatment Rehabilitation  SUBJECTIVE:                                                                                                                                                                                              SUBJECTIVE STATEMENT: Doing pretty good, reports no pain or falls.  Pt accompanied by: self  PERTINENT HISTORY: metastatic breast cancer, brain tumor s/p excision with craniotomy; GERD, morbid obesity.  Admitted to rehab ([facility]) 11/17/21 and d/c home 12/09/2021  PAIN:  Are you having pain? No  PRECAUTIONS: Fall and Other: wearing R GRAFO, no driving  FALLS: Has patient fallen in last 6 months? 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 Now s/p L frontal crani tumor excision 5/19.  COGNITION: Overall cognitive status: Within functional limits for tasks assessed   SENSATION:  Just starting to get sensation back on bottom of R foot Light touch: WFL  COORDINATION: Decreased coordination RLE-needs assist of LLE or UEs to place RLE   EDEMA:  Noted in RLE-pt reports   MUSCLE TONE: RLE: Hypotonic   POSTURE: rounded shoulders  LOWER EXTREMITY ROM:     Passive  Right Eval Left Eval  Hip flexion    Hip extension    Hip abduction    Hip adduction    Hip internal rotation    Hip external rotation    Knee flexion    Knee extension    Ankle dorsiflexion -10 from neutral   Ankle plantarflexion    Ankle inversion    Ankle eversion     (Blank rows = not tested)  LOWER EXTREMITY MMT:    MMT Right Eval Left Eval  Hip flexion 3-/5 4/5  Hip extension    Hip abduction 3+/5 4/5  Hip adduction 3+/5 4/5  Hip internal rotation    Hip external rotation    Knee flexion 3-/5 4+/5  Knee extension 3-/5 4+/5  Ankle dorsiflexion trace 5/5  Ankle plantarflexion    Ankle inversion    Ankle eversion    (Blank rows = not tested)   TRANSFERS: Assistive device utilized: Environmental consultant - 2 wheeled  Sit to stand: SBA Stand to sit: SBA  GAIT: Gait pattern: step through pattern, decreased step length- Left, decreased stance time- Right, decreased ankle dorsiflexion- Right, and genu recurvatum- Right Distance walked: 60 ft  Assistive device utilized: Walker - 2 wheeled and R AFO Level of assistance: SBA Comments: Pt is able to don R AFO  FUNCTIONAL TESTs:  5 times sit to stand: 29.13 Timed up and go (TUG): 42.62 Gait velocity:  23.87 sec = 1.37 ft/sec 5x sit<>stand performed with UE support  PATIENT SURVEYS:  FOTO N/T  TREATMENT 01/05/22 Bike L3 x50mns Reassess BERG  Walking w/o AD in //  bars w/AFO (forwards and side steps) Standing ankle ROM on rocker w/o AFO x10 DF/PF and IV/EV Heel taps on 6" 20reps Gastroc stretch seated w/strap 30s each  Stair training    01/03/22 Nustep L3 x554ms Weight shifts in // bars 2 way hip 2# 2x10 Standing marches in // bars 2# 2x10  LAQ 2# 2x10 RLE  STS elevated mat 2x10    Patient's gait assessed without AFO, agree she doe snot need to use it when indoors, but recommend using it when out of the house for safety due to fatigue. BERG assessed-41/56 Practiced forward weight shifts over RLE without hyperextension at parallel bars, difficutly Wbing with R knee control.  PATIENT EDUCATION: Education details: PT POC, Eval results, initial HEP Person educated: Patient Education method: Explanation, Demonstration, and Handouts Education comprehension: verbalized understanding and returned demonstration   HOME EXERCISE PROGRAM: Access Code: [email or web address]: [email or web address] Date: 12/13/2021 [facility]: [facility]  Exercises - Seated Heel Slide  - 1-2 x daily - 7 x weekly - 1 sets - 5-10 reps - 10-15 sec hold - Sit to Stand with Counter Support  - 1-2 x daily - 7 x weekly - 1-2 sets - 5 reps - Seated Gluteal Sets  - 1-2 x daily - 7 x weekly - 1-2 sets - 10 reps - 3 sec hold    GOALS: Goals reviewed with patient? Yes  SHORT TERM GOALS: Target date: 01/10/2022  Pt will be independent with HEP for improved strength, balance, gait. Baseline:  Initial HEP from CIR Goal status: ongoing  2.  Pt will improve 5x sit<>stand to less than or equal to 20 sec to demonstrate improved functional strength and transfer efficiency. Baseline: 29.13 sec  with BUE support (scores >15 sec indicate fall risk) Goal status: INITIAL  3.  Berg score to be assessed with goal to be written as appropriate. Baseline: Pt at increased fall risk per 5x sit<>stand and TUG Goal status: INITIAL   LONG TERM  GOALS: Target date: 02/07/2022  Pt will be independent with HEP for improved strength, balance, transfers, and gait. Baseline:  Initial HEP from CIR Goal status: INITIAL  2.  Pt will improve 5x sit<>stand to less than or equal to 15 sec to demonstrate improved functional strength and transfer efficiency. Baseline: 29.13 sec with BUE support (scores >15 sec indicate fall risk) Goal status: INITIAL  3.  Pt will improve TUG score to less than or equal to 25 sec for decreased fall risk. Baseline: 42.62 sec (indicates increased fall risk and difficulty with ADLs in home) Goal status: INITIAL  4.  Pt will improve gait velocity to at least 1.8 ft/sec for improved gait efficiency and safety. Baseline: 1.37 ft/sec (1.31-2.62 indicates increased limited community ambulator) Goal status: INITIAL  5.  FOTO score to be assessed and score to improve to predicted level at d/c, indicating improved overall functional mobility. Baseline: not assessed due to time constraints at eval Goal status: INITIAL  ASSESSMENT:  CLINICAL IMPRESSION: Patient continues to show progress with gait and control on RLE. She is able to walk in bars without assistance, slowed gait forwards with some hesitancy. Difficulty with backwards stepping, requires small steps to get foot placed back. Worked on heel taps on 6" step and trailed stairs w/o AFO and holding on to 1 rail. She is able to complete this slowly due to being nervous but does so independently. Continue working on stair and gait training next visit.   OBJECTIVE IMPAIRMENTS Abnormal gait, decreased balance, decreased mobility, difficulty walking, decreased ROM, decreased strength, impaired flexibility, and impaired tone.   ACTIVITY LIMITATIONS standing, transfers, locomotion level, and caring for others  PARTICIPATION LIMITATIONS: meal prep, driving, shopping, community activity, and occupation  PERSONAL FACTORS 3+ comorbidities: see above for PMH  are also  affecting patient's functional outcome.   REHAB POTENTIAL: Good  CLINICAL DECISION MAKING: Evolving/moderate complexity  EVALUATION COMPLEXITY: Moderate  PLAN: PT FREQUENCY: 2x/week  PT DURATION: 8 weeks plus eval  PLANNED INTERVENTIONS: Therapeutic exercises, Therapeutic activity, Neuromuscular re-education, Balance training, Gait training, Patient/Family education, Joint mobilization, Orthotic/Fit training, DME instructions, Aquatic Therapy, and Manual therapy  PLAN FOR NEXT SESSION:  Work on R ankle range/flexibility in standing, quad/hamstring strength, gait training w/o AFO, fitter, stairs    [place], DPT 01/05/2022, 10:24 AM    [facility]

## 2022-01-08 ENCOUNTER — Ambulatory Visit
Admission: RE | Admit: 2022-01-08 | Discharge: 2022-01-08 | Disposition: A | Discharge status: Home / Self Care | Payer: Medicaid Other | Source: Ambulatory Visit | Attending: Adult Health | Admitting: Adult Health

## 2022-01-08 ENCOUNTER — Other Ambulatory Visit: Payer: Self-pay | Admitting: Adult Health

## 2022-01-08 DIAGNOSIS — C50412 Malignant neoplasm of upper-outer quadrant of left female breast: Secondary | ICD-10-CM

## 2022-01-08 DIAGNOSIS — C7931 Secondary malignant neoplasm of brain: Secondary | ICD-10-CM

## 2022-01-09 ENCOUNTER — Ambulatory Visit: Payer: Medicaid Other

## 2022-01-09 DIAGNOSIS — R2681 Unsteadiness on feet: Secondary | ICD-10-CM

## 2022-01-09 DIAGNOSIS — Z483 Aftercare following surgery for neoplasm: Secondary | ICD-10-CM

## 2022-01-09 DIAGNOSIS — R2689 Other abnormalities of gait and mobility: Secondary | ICD-10-CM

## 2022-01-09 DIAGNOSIS — R278 Other lack of coordination: Secondary | ICD-10-CM | POA: Diagnosis not present

## 2022-01-09 DIAGNOSIS — M6281 Muscle weakness (generalized): Secondary | ICD-10-CM

## 2022-01-09 NOTE — Therapy (Signed)
OUTPATIENT PHYSICAL THERAPY NEURO TREATMENT   Patient Name: Sheryl Mcmillan MRN: [id number] DOB:[birth year], 46 y.o., female Today's Date: 01/09/2022   PCP: Windell Hummingbird, PA-C REFERRING PROVIDER: Marlowe Shores, PA-C (to f/u with Izora Ribas, MD)   PT End of Session - 01/09/22 1232     Visit Number 5    Number of Visits 17    Date for PT Re-Evaluation 02/07/22    Authorization Type Medicaid [employer] 2023 (?auth required after 12 visits-combined)    PT Start Time 1231    PT Stop Time 1313    PT Time Calculation (min) 42 min    Equipment Utilized During Treatment Gait belt    Activity Tolerance Patient tolerated treatment well    Behavior During Therapy WFL for tasks assessed/performed                 Past Medical History:  Diagnosis Date   Arthritis    Breast cancer ([place])    Family history of non-Hodgkin's lymphoma 06/01/2020   GERD (gastroesophageal reflux disease)    Hemorrhoids    Migraines    PCOS (polycystic ovarian syndrome)    PONV (postoperative nausea and vomiting)    Past Surgical History:  Procedure Laterality Date   APPLICATION OF CRANIAL NAVIGATION Left 11/10/2021   Procedure: APPLICATION OF CRANIAL NAVIGATION;  Surgeon: Consuella Lose, MD;  Location: [place];  Service: Neurosurgery;  Laterality: Left;   AXILLARY SENTINEL NODE BIOPSY Left 11/07/2020   Procedure: LEFT AXILLARY SENTINEL NODE BIOPSY;  Surgeon: [name], MD;  Location: [place];  Service: General;  Laterality: Left;   BREAST CYST EXCISION Right 09/22/2021   Procedure: Excision of right axillary excess soft tissue.;  Surgeon: [name], MD;  Location: [place];  Service: Plastics;  Laterality: Right;   BREAST RECONSTRUCTION WITH PLACEMENT OF TISSUE EXPANDER AND FLEX HD (ACELLULAR HYDRATED DERMIS) Bilateral 11/07/2020   Procedure: BILATERAL BREAST RECONSTRUCTION WITH PLACEMENT OF TISSUE EXPANDER AND FLEX HD (ACELLULAR HYDRATED DERMIS);  Surgeon: [name], MD;  Location: [place];  Service: Plastics;  Laterality: Bilateral;   COSMETIC SURGERY     CRANIOTOMY Left 11/10/2021   Procedure: LT FRONTAL CRANIOTOMY TUMOR EXCISION;  Surgeon: Consuella Lose, MD;  Location: [place];  Service: Neurosurgery;  Laterality: Left;   DILATION AND CURETTAGE OF UTERUS     HEMORRHOID SURGERY     IR IMAGING GUIDED PORT INSERTION  06/15/2020   LIPOSUCTION WITH LIPOFILLING Bilateral 09/22/2021   Procedure: Fat grafting bilateral breast;  Surgeon: [name], MD;  Location: [place];  Service: Plastics;  Laterality: Bilateral;   MASS EXCISION Bilateral 04/25/2021   Procedure: excision of bilateral axillary breast tissue;  Surgeon: [name], MD;  Location: [place];  Service: Plastics;  Laterality: Bilateral;   PORT-A-CATH REMOVAL Right 11/07/2020   Procedure: REMOVAL PORT-A-CATH;  Surgeon: [name], MD;  Location: [place];  Service: General;  Laterality: Right;   REMOVAL OF BILATERAL TISSUE EXPANDERS WITH PLACEMENT OF BILATERAL BREAST IMPLANTS Bilateral 04/25/2021   Procedure: REMOVAL OF BILATERAL TISSUE EXPANDERS WITH PLACEMENT OF BILATERAL BREAST IMPLANTS;  Surgeon: [name], MD;  Location: [place];  Service: Plastics;  Laterality: Bilateral;   ROBOTIC ASSISTED TOTAL HYSTERECTOMY WITH BILATERAL SALPINGO OOPHERECTOMY Bilateral 03/14/2021   Procedure: XI ROBOTIC ASSISTED TOTAL HYSTERECTOMY WITH BILATERAL SALPINGO OOPHORECTOMY;  Surgeon: Everitt Amber, MD;  Location: [facility];  Service: Gynecology;  Laterality: Bilateral;   TOTAL MASTECTOMY Bilateral  11/07/2020   Procedure: BILATERAL MASTECTOMY;  Surgeon: [name], MD;  Location: [place];  Service: General;  Laterality: Bilateral;   Patient Active Problem List   Diagnosis Date Noted   Brain tumor ([place]) 11/08/2021   Metastatic cancer to brain ([place]) 11/08/2021   Cerebral edema ([place]) 11/08/2021   Complex partial seizure ([place]) 11/08/2021    Brain mass 11/07/2021   Right sided weakness 11/07/2021   IDA (iron deficiency anemia) 07/18/2021   Cholelithiasis 03/14/2021   Peripheral neuropathy due to chemotherapy ([place]) 10/20/2020   Morbid obesity with BMI of 40.0-44.9, adult ([place]) 07/19/2020   BRCA1 gene mutation positive 07/04/2020   Genetic testing 06/28/2020   Family history of non-Hodgkin's lymphoma 06/01/2020   Malignant neoplasm of upper-outer quadrant of left breast in female, estrogen receptor positive ([place]) 05/26/2020    ONSET DATE: 12/05/2021 (MD referral)  REFERRING DIAG: D49.6 (ICD-10-CM) - Neoplasm of unspecified behavior of brain  THERAPY DIAG:  Unsteadiness on feet  Other lack of coordination  Other abnormalities of gait and mobility  Aftercare following surgery for neoplasm  Muscle weakness (generalized)  Rationale for Evaluation and Treatment Rehabilitation  SUBJECTIVE:                                                                                                                                                                                              SUBJECTIVE STATEMENT: Doing pretty good, reports no pain or falls.  Pt accompanied by: self  PERTINENT HISTORY: metastatic breast cancer, brain tumor s/p excision with craniotomy; GERD, morbid obesity.  Admitted to rehab ([facility]) 11/17/21 and d/c home 12/09/2021  PAIN:  Are you having pain? No  PRECAUTIONS: Fall and Other: wearing R GRAFO, no driving  FALLS: Has patient fallen in last 6 months? 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 Now s/p L frontal crani tumor excision 5/19.  COGNITION: Overall cognitive status: Within functional limits for tasks assessed   SENSATION:  Just starting to get sensation back on bottom of R foot Light touch: WFL  COORDINATION: Decreased coordination RLE-needs assist of LLE or UEs  to place RLE   EDEMA:  Noted in RLE-pt reports   MUSCLE TONE: RLE: Hypotonic   POSTURE: rounded shoulders  LOWER EXTREMITY ROM:     Passive  Right Eval Left Eval  Hip flexion    Hip extension    Hip abduction    Hip adduction    Hip internal rotation    Hip external rotation    Knee flexion    Knee extension    Ankle dorsiflexion -10 from neutral   Ankle plantarflexion    Ankle inversion    Ankle eversion     (Blank rows = not tested)  LOWER EXTREMITY MMT:    MMT Right Eval Left Eval  Hip flexion 3-/5 4/5  Hip extension    Hip abduction 3+/5 4/5  Hip adduction 3+/5 4/5  Hip internal rotation    Hip external rotation    Knee flexion 3-/5 4+/5  Knee extension 3-/5 4+/5  Ankle dorsiflexion trace 5/5  Ankle plantarflexion    Ankle inversion    Ankle eversion    (Blank rows = not tested)   TRANSFERS: Assistive device utilized: Environmental consultant - 2 wheeled  Sit to stand: SBA Stand to sit: SBA  GAIT: Gait pattern: step through pattern, decreased step length- Left, decreased stance time- Right, decreased ankle dorsiflexion- Right, and genu recurvatum- Right Distance walked: 60 ft  Assistive device utilized: Walker - 2 wheeled and R AFO Level of assistance: SBA Comments: Pt is able to don R AFO  FUNCTIONAL TESTs:  5 times sit to stand: 29.13 Timed up and go (TUG): 42.62 Gait velocity:  23.87 sec = 1.37 ft/sec 5x sit<>stand performed with UE support  PATIENT SURVEYS:  FOTO N/T  TREATMENT 01/09/22 Nustep L3 x87mns Walking without AD   Fitter 2x10  Step ups 6"  Knee ext 10# bilat R eccentric, 5# RLE only x10 HS curls 5# x10 RLE only   01/05/22 Bike L3 x6110ms Reassess BERG  Walking w/o AD in // bars w/AFO (forwards and side steps) Standing ankle ROM on rocker w/o AFO x10 DF/PF and IV/EV Heel taps on 6" 20reps Gastroc stretch seated w/strap 30s each  Stair training    01/03/22 Nustep L3 x5m31m Weight shifts in // bars 2 way hip 2# 2x10 Standing marches in // bars 2# 2x10  LAQ 2# 2x10 RLE  STS elevated mat 2x10    Patient's gait assessed without AFO, agree she doe snot need to use it when indoors, but recommend using it when out of the house for safety due to fatigue. BERG assessed-41/56 Practiced forward weight shifts over RLE without hyperextension at parallel bars, difficutly Wbing with R knee control.  PATIENT EDUCATION: Education details: PT POC, Eval results, initial HEP Person educated: Patient Education method: Explanation, Demonstration, and Handouts Education comprehension: verbalized understanding and returned demonstration   HOME EXERCISE PROGRAM: Access Code: [email or web address]: [email or web address] Date: 12/13/2021 Prepared by: [facility]  Exercises - Seated Heel Slide  - 1-2 x daily - 7 x weekly - 1 sets - 5-10 reps - 10-15 sec hold - Sit to Stand with Counter Support  - 1-2 x daily - 7 x weekly - 1-2 sets - 5 reps - Seated Gluteal Sets  - 1-2 x daily - 7 x weekly - 1-2 sets - 10 reps - 3 sec hold    GOALS: Goals reviewed with patient? Yes  SHORT TERM GOALS: Target date: 01/10/2022  Pt will be independent with HEP for  improved strength, balance, gait. Baseline:  Initial HEP from [facility] Goal status: ongoing  2.  Pt will improve 5x sit<>stand to less than or equal to 20 sec to demonstrate improved functional strength and transfer efficiency. Baseline: 29.13 sec with BUE support (scores >15 sec indicate fall risk) Goal status:  INITIAL  3.  Berg score to be assessed with goal to be written as appropriate. Baseline: Pt at increased fall risk per 5x sit<>stand and TUG Goal status: INITIAL   LONG TERM GOALS: Target date: 02/07/2022  Pt will be independent with HEP for improved strength, balance, transfers, and gait. Baseline:  Initial HEP from CIR Goal status: INITIAL  2.  Pt will improve 5x sit<>stand to less than or equal to 15 sec to demonstrate improved functional strength and transfer efficiency. Baseline: 29.13 sec with BUE support (scores >15 sec indicate fall risk) Goal status: INITIAL  3.  Pt will improve TUG score to less than or equal to 25 sec for decreased fall risk. Baseline: 42.62 sec (indicates increased fall risk and difficulty with ADLs in home) Goal status: INITIAL  4.  Pt will improve gait velocity to at least 1.8 ft/sec for improved gait efficiency and safety. Baseline: 1.37 ft/sec (1.31-2.62 indicates increased limited community ambulator) Goal status: INITIAL  5.  FOTO score to be assessed and score to improve to predicted level at d/c, indicating improved overall functional mobility. Baseline: not assessed due to time constraints at eval Goal status: INITIAL  ASSESSMENT:  CLINICAL IMPRESSION: Patient is able to walk without AD 2 laps with CGA for safety but able to do with slow speed and cues for increasing step length. Knee extension requires cues to get TKE but unable to get full extension, completed with patient available ranges. Able to demonstrate better form with more reps. She is fearful and is hesitant with steps without holding on. She does a  good job stepping up but is scared coming down. Able to do a few reps descending without support. She is able to walk in clinic distances without AFO, slowed gait but able to do independently. Continue with gait training and balance.    OBJECTIVE IMPAIRMENTS Abnormal gait, decreased balance, decreased mobility, difficulty walking,  decreased ROM, decreased strength, impaired flexibility, and impaired tone.   ACTIVITY LIMITATIONS standing, transfers, locomotion level, and caring for others  PARTICIPATION LIMITATIONS: meal prep, driving, shopping, community activity, and occupation  PERSONAL FACTORS 3+ comorbidities: see above for PMH  are also affecting patient's functional outcome.   REHAB POTENTIAL: Good  CLINICAL DECISION MAKING: Evolving/moderate complexity  EVALUATION COMPLEXITY: Moderate  PLAN: PT FREQUENCY: 2x/week  PT DURATION: 8 weeks plus eval  PLANNED INTERVENTIONS: Therapeutic exercises, Therapeutic activity, Neuromuscular re-education, Balance training, Gait training, Patient/Family education, Joint mobilization, Orthotic/Fit training, DME instructions, Aquatic Therapy, and Manual therapy  PLAN FOR NEXT SESSION:  Work on R ankle range/flexibility in standing, quad/hamstring strength, gait training w/o AFO   Andris Baumann, DPT 01/09/2022, 1:14 PM    [place] [facility]

## 2022-01-11 NOTE — Therapy (Signed)
OUTPATIENT PHYSICAL THERAPY NEURO TREATMENT   Patient Name: Sheryl Mcmillan MRN: [id number] DOB:1975-12-08, 46 y.o., female Today's Date: 01/12/2022   PCP: Windell Hummingbird, PA-C REFERRING PROVIDER: Marlowe Shores, PA-C (to f/u with Izora Ribas, MD)   PT End of Session - 01/12/22 0932     Visit Number 6    Number of Visits 17    Date for PT Re-Evaluation 02/07/22    Authorization Type Medicaid [employer] 2023 (?auth required after 12 visits-combined)    PT Start Time 0931    PT Stop Time 1016    PT Time Calculation (min) 45 min    Equipment Utilized During Treatment --    Activity Tolerance Patient tolerated treatment well    Behavior During Therapy [facility] for tasks assessed/performed                  Past Medical History:  Diagnosis Date   Arthritis    Breast cancer ([place])    Family history of non-Hodgkin's lymphoma 06/01/2020   GERD (gastroesophageal reflux disease)    Hemorrhoids    Migraines    PCOS (polycystic ovarian syndrome)    PONV (postoperative nausea and vomiting)    Past Surgical History:  Procedure Laterality Date   APPLICATION OF CRANIAL NAVIGATION Left 11/10/2021   Procedure: APPLICATION OF CRANIAL NAVIGATION;  Surgeon: Consuella Lose, MD;  Location: [place];  Service: Neurosurgery;  Laterality: Left;   AXILLARY SENTINEL NODE BIOPSY Left 11/07/2020   Procedure: LEFT AXILLARY SENTINEL NODE BIOPSY;  Surgeon: [name], MD;  Location: [place];  Service: General;  Laterality: Left;   BREAST CYST EXCISION Right 09/22/2021   Procedure: Excision of right axillary excess soft tissue.;  Surgeon: [name], MD;  Location: [place];  Service: Plastics;  Laterality: Right;   BREAST RECONSTRUCTION WITH PLACEMENT OF TISSUE EXPANDER AND FLEX HD (ACELLULAR HYDRATED DERMIS) Bilateral 11/07/2020   Procedure: BILATERAL BREAST RECONSTRUCTION WITH PLACEMENT OF TISSUE EXPANDER AND FLEX HD (ACELLULAR HYDRATED DERMIS);  Surgeon: [name],  MD;  Location: [place];  Service: Plastics;  Laterality: Bilateral;   COSMETIC SURGERY     CRANIOTOMY Left 11/10/2021   Procedure: LT FRONTAL CRANIOTOMY TUMOR EXCISION;  Surgeon: Consuella Lose, MD;  Location: [place];  Service: Neurosurgery;  Laterality: Left;   DILATION AND CURETTAGE OF UTERUS     HEMORRHOID SURGERY     IR IMAGING GUIDED PORT INSERTION  06/15/2020   LIPOSUCTION WITH LIPOFILLING Bilateral 09/22/2021   Procedure: Fat grafting bilateral breast;  Surgeon: [name], MD;  Location: [place];  Service: Plastics;  Laterality: Bilateral;   MASS EXCISION Bilateral 04/25/2021   Procedure: excision of bilateral axillary breast tissue;  Surgeon: [name], MD;  Location: [place];  Service: Plastics;  Laterality: Bilateral;   PORT-A-CATH REMOVAL Right 11/07/2020   Procedure: REMOVAL PORT-A-CATH;  Surgeon: [name], MD;  Location: [place];  Service: General;  Laterality: Right;   REMOVAL OF BILATERAL TISSUE EXPANDERS WITH PLACEMENT OF BILATERAL BREAST IMPLANTS Bilateral 04/25/2021   Procedure: REMOVAL OF BILATERAL TISSUE EXPANDERS WITH PLACEMENT OF BILATERAL BREAST IMPLANTS;  Surgeon: [name], MD;  Location: [place];  Service: Plastics;  Laterality: Bilateral;   ROBOTIC ASSISTED TOTAL HYSTERECTOMY WITH BILATERAL SALPINGO OOPHERECTOMY Bilateral 03/14/2021   Procedure: XI ROBOTIC ASSISTED TOTAL HYSTERECTOMY WITH BILATERAL SALPINGO OOPHORECTOMY;  Surgeon: Everitt Amber, MD;  Location: [facility];  Service: Gynecology;  Laterality: Bilateral;   TOTAL MASTECTOMY Bilateral  11/07/2020   Procedure: BILATERAL MASTECTOMY;  Surgeon: [name], MD;  Location: [place];  Service: General;  Laterality: Bilateral;   Patient Active Problem List   Diagnosis Date Noted   Brain tumor ([place]) 11/08/2021   Metastatic cancer to brain ([place]) 11/08/2021   Cerebral edema ([place]) 11/08/2021   Complex partial seizure ([place]) 11/08/2021   Brain  mass 11/07/2021   Right sided weakness 11/07/2021   IDA (iron deficiency anemia) 07/18/2021   Cholelithiasis 03/14/2021   Peripheral neuropathy due to chemotherapy ([place]) 10/20/2020   Morbid obesity with BMI of 40.0-44.9, adult ([place]) 07/19/2020   BRCA1 gene mutation positive 07/04/2020   Genetic testing 06/28/2020   Family history of non-Hodgkin's lymphoma 06/01/2020   Malignant neoplasm of upper-outer quadrant of left breast in female, estrogen receptor positive ([place]) 05/26/2020    ONSET DATE: 12/05/2021 (MD referral)  REFERRING DIAG: D49.6 (ICD-10-CM) - Neoplasm of unspecified behavior of brain  THERAPY DIAG:  Unsteadiness on feet  Other lack of coordination  Other abnormalities of gait and mobility  Aftercare following surgery for neoplasm  Muscle weakness (generalized)  Abnormal posture  Rationale for Evaluation and Treatment Rehabilitation  SUBJECTIVE:                                                                                                                                                                                              SUBJECTIVE STATEMENT: Doing good overall, if I stand up too long it starts to bother my hip. I am using my rollator today.   Pt accompanied by: self  PERTINENT HISTORY: metastatic breast cancer, brain tumor s/p excision with craniotomy; GERD, morbid obesity.  Admitted to rehab ([facility]) 11/17/21 and d/c home 12/09/2021  PAIN:  Are you having pain? No  PRECAUTIONS: Fall and Other: wearing R GRAFO, no driving  FALLS: Has patient fallen in last 6 months? 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 Now s/p L frontal crani tumor excision 5/19.  COGNITION: Overall cognitive status: Within functional limits for tasks assessed   SENSATION:  Just starting to get sensation back on  bottom of R foot Light touch: WFL  COORDINATION: Decreased coordination RLE-needs assist of LLE or UEs to place RLE   EDEMA:  Noted in RLE-pt reports   MUSCLE TONE: RLE: Hypotonic   POSTURE: rounded shoulders  LOWER EXTREMITY ROM:     Passive  Right Eval Left Eval  Hip flexion    Hip extension    Hip abduction    Hip adduction    Hip internal rotation    Hip external rotation    Knee flexion    Knee extension    Ankle dorsiflexion -10 from neutral   Ankle plantarflexion    Ankle inversion    Ankle eversion     (Blank rows = not tested)  LOWER EXTREMITY MMT:    MMT Right Eval Left Eval  Hip flexion 3-/5 4/5  Hip extension    Hip abduction 3+/5 4/5  Hip adduction 3+/5 4/5  Hip internal rotation    Hip external rotation    Knee flexion 3-/5 4+/5  Knee extension 3-/5 4+/5  Ankle dorsiflexion trace 5/5  Ankle plantarflexion    Ankle inversion    Ankle eversion    (Blank rows = not tested)   TRANSFERS: Assistive device utilized: Environmental consultant - 2 wheeled  Sit to stand: SBA Stand to sit: SBA  GAIT: Gait pattern: step through pattern, decreased step length- Left, decreased stance time- Right, decreased ankle dorsiflexion- Right, and genu recurvatum- Right Distance walked: 60 ft  Assistive device utilized: Walker - 2 wheeled and R AFO Level of assistance: SBA Comments: Pt is able to don R AFO  FUNCTIONAL TESTs:  5 times sit to stand: 29.13 Timed up and go (TUG): 42.62 Gait velocity:  23.87 sec = 1.37 ft/sec 5x sit<>stand performed with UE support  PATIENT SURVEYS:   FOTO N/T  TREATMENT 01/12/22 Nustep L5 x60mns  Ankle ROM on disc Walking w/o AFO Side step ups 2HHA SL cone taps with RLE STS with OHP yellow ball x10  Resisted gait 20# forwards and side steps x3  01/09/22  Nustep L3 x656ms Walking without AD  Fitter 2x10  Step ups 6"  Knee ext 10# bilat R eccentric, 5# RLE only x10 HS curls 5# x10 RLE only   01/05/22 Bike L3 x6m33m Reassess BERG  Walking w/o AD in // bars w/AFO (forwards and side steps) Standing ankle ROM on rocker w/o AFO x10 DF/PF and IV/EV Heel taps on 6" 20reps Gastroc stretch seated w/strap 30s each  Stair training    01/03/22 Nustep L3 x5mi70mWeight shifts in // bars 2 way hip 2# 2x10 Standing marches in // bars 2# 2x10  LAQ 2# 2x10 RLE  STS elevated mat 2x10    Patient's gait assessed without AFO, agree she doe snot need to use it when indoors, but recommend using it when out of the house for safety due to fatigue. BERG assessed-41/56 Practiced forward weight shifts over RLE without hyperextension at parallel bars, difficutly Wbing with R knee control.  PATIENT EDUCATION: Education details: PT POC, Eval results, initial HEP Person educated: Patient Education method: Explanation, Demonstration, and Handouts Education comprehension: verbalized understanding and returned demonstration   HOME EXERCISE PROGRAM: Access Code: [email or web address]: [email or web address] Date: 12/13/2021 Prepared by: [facility]  Exercises - Seated Heel Slide  - 1-2 x daily - 7 x weekly - 1 sets - 5-10 reps - 10-15 sec hold - Sit to Stand with Counter Support  - 1-2 x daily - 7  x weekly - 1-2 sets - 5 reps - Seated Gluteal Sets  - 1-2 x daily - 7 x weekly - 1-2 sets - 10 reps - 3 sec hold    GOALS: Goals reviewed with patient? Yes  SHORT TERM GOALS: Target date: 01/10/2022  Pt will be independent with HEP for improved strength, balance, gait. Baseline:  Initial HEP from  [facility] Goal status: ongoing  2.  Pt will improve 5x sit<>stand to less than or equal to 20 sec to demonstrate improved functional strength and transfer efficiency. Baseline: 29.13 sec with BUE support (scores >15 sec indicate fall risk) Goal status: INITIAL  3.  Berg score to be assessed with goal to be written as appropriate. Baseline: Pt at increased fall risk per 5x sit<>stand and TUG Goal status: INITIAL   LONG TERM GOALS: Target date: 02/07/2022  Pt will be independent with HEP for improved strength, balance, transfers, and gait. Baseline:  Initial HEP from CIR Goal status: INITIAL  2.  Pt will improve 5x sit<>stand to less than or equal to 15 sec to demonstrate improved functional strength and transfer efficiency. Baseline: 29.13 sec with BUE support (scores >15 sec indicate fall risk) Goal status: INITIAL  3.  Pt will improve TUG score to less than or equal to 25 sec for decreased fall risk. Baseline: 42.62 sec (indicates increased fall risk and difficulty with ADLs in home) Goal status: INITIAL  4.  Pt will improve gait velocity to at least 1.8 ft/sec for improved gait efficiency and safety. Baseline: 1.37 ft/sec (1.31-2.62 indicates increased limited community ambulator) Goal status: INITIAL  5.  FOTO score to be assessed and score to improve to predicted level at d/c, indicating improved overall functional mobility. Baseline: not assessed due to time constraints at eval Goal status: INITIAL  ASSESSMENT:  CLINICAL IMPRESSION: Patient is able to show slight activation on R ankle movements into PF on disc which she was unable to do before. Still having some difficulty with DF activation. She is able to walk without AFO and AD, slowed gait and decreased stance time on RLE but builds confidence by second lap. SL RLE cone taps working on controlling movement and placement, she does really well with this. Does resisted gait with some hesitancy, difficulty with backwards stepping  and side steps.   OBJECTIVE IMPAIRMENTS Abnormal gait, decreased balance, decreased mobility, difficulty walking, decreased ROM, decreased strength, impaired flexibility, and impaired tone.   ACTIVITY LIMITATIONS standing, transfers, locomotion level, and caring for others  PARTICIPATION LIMITATIONS: meal prep, driving, shopping, community activity, and occupation  PERSONAL FACTORS 3+ comorbidities: see above for PMH  are also affecting patient's functional outcome.   REHAB POTENTIAL: Good  CLINICAL DECISION MAKING: Evolving/moderate complexity  EVALUATION COMPLEXITY: Moderate  PLAN: PT FREQUENCY: 2x/week  PT DURATION: 8 weeks plus eval  PLANNED INTERVENTIONS: Therapeutic exercises, Therapeutic activity, Neuromuscular re-education, Balance training, Gait training, Patient/Family education, Joint mobilization, Orthotic/Fit training, DME instructions, Aquatic Therapy, and Manual therapy  PLAN FOR NEXT SESSION:  Work on R ankle range/flexibility in standing, quad/hamstring strength, gait training w/o AFO   Andris Baumann, DPT 01/12/2022, 10:17 AM    [place] [facility]

## 2022-01-12 ENCOUNTER — Ambulatory Visit: Payer: Medicaid Other | Admitting: Occupational Therapy

## 2022-01-12 ENCOUNTER — Ambulatory Visit: Payer: Medicaid Other

## 2022-01-12 DIAGNOSIS — R2681 Unsteadiness on feet: Secondary | ICD-10-CM

## 2022-01-12 DIAGNOSIS — R278 Other lack of coordination: Secondary | ICD-10-CM

## 2022-01-12 DIAGNOSIS — M6281 Muscle weakness (generalized): Secondary | ICD-10-CM

## 2022-01-12 DIAGNOSIS — Z483 Aftercare following surgery for neoplasm: Secondary | ICD-10-CM

## 2022-01-12 DIAGNOSIS — R2689 Other abnormalities of gait and mobility: Secondary | ICD-10-CM

## 2022-01-12 DIAGNOSIS — R293 Abnormal posture: Secondary | ICD-10-CM

## 2022-01-15 ENCOUNTER — Other Ambulatory Visit: Payer: Self-pay

## 2022-01-15 ENCOUNTER — Encounter (HOSPITAL_COMMUNITY): Payer: Self-pay | Admitting: Emergency Medicine

## 2022-01-15 ENCOUNTER — Emergency Department (HOSPITAL_COMMUNITY)
Admission: EM | Admit: 2022-01-15 | Discharge: 2022-01-15 | Disposition: A | Discharge status: Home / Self Care | Payer: Medicaid Other | Attending: Emergency Medicine | Admitting: Emergency Medicine

## 2022-01-15 ENCOUNTER — Emergency Department (HOSPITAL_COMMUNITY): Payer: Medicaid Other

## 2022-01-15 DIAGNOSIS — S8002XA Contusion of left knee, initial encounter: Secondary | ICD-10-CM | POA: Insufficient documentation

## 2022-01-15 DIAGNOSIS — W19XXXA Unspecified fall, initial encounter: Secondary | ICD-10-CM

## 2022-01-15 DIAGNOSIS — W1830XA Fall on same level, unspecified, initial encounter: Secondary | ICD-10-CM | POA: Insufficient documentation

## 2022-01-15 DIAGNOSIS — Z85841 Personal history of malignant neoplasm of brain: Secondary | ICD-10-CM | POA: Insufficient documentation

## 2022-01-15 DIAGNOSIS — S8992XA Unspecified injury of left lower leg, initial encounter: Secondary | ICD-10-CM | POA: Diagnosis present

## 2022-01-15 DIAGNOSIS — C50412 Malignant neoplasm of upper-outer quadrant of left female breast: Secondary | ICD-10-CM | POA: Diagnosis not present

## 2022-01-15 DIAGNOSIS — T07XXXA Unspecified multiple injuries, initial encounter: Secondary | ICD-10-CM

## 2022-01-15 LAB — I-STAT BETA HCG BLOOD, ED (MC, WL, AP ONLY): I-stat hCG, quantitative: <5 m[IU]/mL (ref <5)

## 2022-01-15 MED ORDER — HYDROMORPHONE HCL 1 MG/ML IJ SOLN: 1.0000 mg | Freq: Once | INTRAMUSCULAR | Status: DC

## 2022-01-15 MED ORDER — OXYCODONE-ACETAMINOPHEN 5-325 MG PO TABS
2.0000 | ORAL_TABLET | Freq: Once | ORAL | Status: AC
  Administered 2022-01-15: 2 via ORAL
  Filled 2022-01-15: qty 2

## 2022-01-15 MED ORDER — FENTANYL CITRATE PF 50 MCG/ML IJ SOSY
50.0000 ug | PREFILLED_SYRINGE | Freq: Once | INTRAMUSCULAR | Status: AC
  Administered 2022-01-15: 50 ug via INTRAVENOUS
  Filled 2022-01-15: qty 1

## 2022-01-15 MED ORDER — METHOCARBAMOL 500 MG PO TABS: 500.0000 mg | ORAL_TABLET | Freq: Three times a day (TID) | ORAL | 0 refills | Status: DC | PRN

## 2022-01-15 NOTE — ED Provider Notes (Signed)
On attempted discharge patient was tachycardic to the 120s.  EKG shows sinus rhythm.  She refused CBG check.  She requested IV to be removed and she left before she been reassessed.   Ezequiel Essex, MD 01/15/22 2347

## 2022-01-15 NOTE — ED Notes (Signed)
Pt ambulated in hall using walking with steady gait.

## 2022-01-15 NOTE — ED Triage Notes (Signed)
Pt bib [facility] from home for mechanical fall at approx 1am. Denies loc, denies blood thinners. Pt c/o of R hip pain on arrival. Per ems, no obvious deformity noted.   Bp 136/82, HR 90, Spo2 98% RA

## 2022-01-15 NOTE — ED Notes (Addendum)
Pt noted to be tachycardiac at time of discharge that pt stated was due to pain. MD notified and ekg obtained. Pt was upset that NT was going to check cbg because she was not a diabetic. Pt requested for IV to be taken out and to leave the hospital. MD informed.

## 2022-01-15 NOTE — Discharge Instructions (Signed)
Your testing is reassuring.  No evidence of any fractures or dislocations.  Take the muscle relaxers as prescribed and follow-up with your primary doctor.  Remember the muscle relaxers could make you sleepy so do not take them if you are working or driving.  Follow-up with your doctor.  Return to the ED with new or worsening symptoms

## 2022-01-15 NOTE — ED Provider Notes (Signed)
[facility] EMERGENCY DEPARTMENT Provider Note   CSN: [id number] Arrival date & time: 01/15/22  1704     History  Chief Complaint  Patient presents with   Sheryl Mcmillan is a 46 y.o. female.   Fall    Patient with past medical history of metastatic brain cancer status post craniotomy, chronic right-sided weakness secondary to craniotomy, PCOS, breast cancer status post double mastectomy presents today due to fall at home.    Patient had a ground-level fall onto tile, did not hit her head or lose consciousness.  Patient has balance issues at baseline, she is in physical therapy and walks with walker.  She had a mechanical fall and landed on her left knee and hit her right hip.  She is having pain to the right hip and low back, also having some tenderness to the knee and notes a bruise to the left knee.  Denies any numbness, no loss of consciousness or retrograde amnesia following the fall.    She is not on any blood thinners.  She is followed by oncology, not currently under radiation or chemotherapy.    Ambulates with walker at baseline, lives with daughter.  Follows outpatient PT and OT.  Home Medications Prior to Admission medications   Medication Sig Start Date End Date Taking? Authorizing Provider  acetaminophen (TYLENOL) 325 MG tablet Take 1-2 tablets (325-650 mg total) by mouth every 4 (four) hours as needed for mild pain. 11/28/21   Love, Ivan Anchors, PA-C  methocarbamol (ROBAXIN) 500 MG tablet Take 1 tablet (500 mg total) by mouth every 6 (six) hours as needed for muscle spasms. 12/07/21   Angiulli, Lavon Paganini, PA-C  Multiple Vitamins-Minerals (ONE-A-DAY WOMENS PO) Take 1 tablet by mouth daily.    [provider]  omeprazole (PRILOSEC) 20 MG capsule Take 40 mg by mouth daily as needed. Patient not taking: Reported on 01/02/2022    [provider]      Allergies    Carboplatin, Other, and Wound dressing adhesive    Review of  Systems   Review of Systems  Physical Exam Updated Vital Signs BP 137/86   Pulse 87   Temp 98.5 F (36.9 C) (Oral)   Resp 17   Ht '5\' 11"'$  (1.803 m)   Wt 135 kg   LMP 06/08/2020   SpO2 96%   BMI 41.51 kg/m  Physical Exam Vitals and nursing note reviewed. Exam conducted with a chaperone present.  Constitutional:      Appearance: Normal appearance.  HENT:     Head: Normocephalic and atraumatic.     Comments: No battle sign, periorbital ecchymosis, septal hematoma, contusions, scalp lacerations. Eyes:     General: No scleral icterus.       Right eye: No discharge.        Left eye: No discharge.     Extraocular Movements: Extraocular movements intact.     Pupils: Pupils are equal, round, and reactive to light.  Neck:     Comments: No midline tenderness, complete ROM. Cardiovascular:     Rate and Rhythm: Normal rate and regular rhythm.     Pulses: Normal pulses.     Heart sounds: Normal heart sounds. No murmur heard.    No friction rub. No gallop.  Pulmonary:     Effort: Pulmonary effort is normal. No respiratory distress.     Breath sounds: Normal breath sounds.  Abdominal:     General: Abdomen is flat. Bowel sounds  are normal. There is no distension.     Palpations: Abdomen is soft.     Tenderness: There is no abdominal tenderness.  Musculoskeletal:     Cervical back: Normal range of motion. No tenderness.     Comments: Limbs are normal length and not rotated. Able to raise left lower extremity, some tenderness with ROM of left knee.  Normal ROM to L ankle and hip.  Right hip tender to palpation,  I do not appreciate any crepitus or contusions, she is able to raise the right lower extremity.  No tenderness to R knee or ankle.  Normal ROM.  Midline tenderness to lumbar spine but no contusions or rashes.  Also surrounding paraspinal tenderness.   Skin:    General: Skin is warm and dry.     Coloration: Skin is not jaundiced.     Findings: Bruising present.     Comments:  Contusion to left knee  Neurological:     Mental Status: She is alert. Mental status is at baseline.     Coordination: Coordination normal.     Comments: Cranial nerves III through XII are grossly intact.  Grips strength is equal bilaterally, no pronator drift.  Right lower extremity strength weaker than left, baseline.  Plantarflexion dorsiflexion normal.     ED Results / Procedures / Treatments   Labs (all labs ordered are listed, but only abnormal results are displayed) Labs Reviewed  I-STAT BETA HCG BLOOD, ED ([facility], [facility], [facility] ONLY)    EKG None  Radiology DG Knee Complete 4 Views Left  Result Date: 01/15/2022 CLINICAL DATA:  Mechanical fall EXAM: LEFT KNEE - COMPLETE 4+ VIEW COMPARISON:  None Available. FINDINGS: No fracture or malalignment. Mild tricompartment arthritis of the knee. No significant knee effusion IMPRESSION: No acute osseous abnormality Electronically Signed   By: Donavan Foil M.D.   On: 01/15/2022 18:20    Procedures Procedures    Medications Ordered in ED Medications  fentaNYL (SUBLIMAZE) injection 50 mcg (50 mcg Intravenous Given 01/15/22 1831)    ED Course/ Medical Decision Making/ A&P                           Medical Decision Making Amount and/or Complexity of Data Reviewed Radiology: ordered.  Risk Prescription drug management.   Patient presents due to fall. Ddx includes not limited to fracture, dislocation, msk injury, cauda equina (less likely), c spine injury, intracranial hemorrhage, skull fracture.  There are no focal deficits on neuro exam, C spine normal ROM without midline tenderness. No signs of basilar skull fracture.  I ordered imaging of knee and CT imaging of hip and lumbar spine to evaluate for possible occult fracture. Initially was not going to proceed with CT head but given patient's history of craniotomy, metastatic brain cancer will obtain here in the ED.    I considered c spine but very low suspicion for any traumatic findings  given she did not hit her head and does fact she does not have any focal deficits.  There is no midline tenderness, she is complete ROM.  Based on Nexus C-spine criteria patient is very low risk and imaging is not indicated.   I have ordered fentanyl for patient's pain.  I also reviewed patient's home medication list, she is not on any anticoagulation.  Plain film of the knee viewed and interpreted by myself.  Negative for any acute process, agree with radiologist interpretation.  CT imaging is pending at time  of shift change.  Patient care signed out to my attending Dr. Wyvonnia Dusky who has evaluated the patient.  Disposition pending results of imaging  Discussed HPI, physical exam and plan of care for this patient with attending Ezequiel Essex. The attending physician evaluated this patient as part of a shared visit and agrees with plan of care.        Final Clinical Impression(s) / ED Diagnoses Final diagnoses:  None    Rx / DC Orders ED Discharge Orders     None         Sherrill Raring, Hershal Coria 01/15/22 1900    Rancour, Annie Main, MD 01/15/22 2313

## 2022-01-16 ENCOUNTER — Ambulatory Visit: Payer: Medicaid Other | Admitting: Hematology and Oncology

## 2022-01-16 ENCOUNTER — Ambulatory Visit: Payer: Medicaid Other

## 2022-01-18 ENCOUNTER — Ambulatory Visit: Payer: Medicaid Other

## 2022-01-18 ENCOUNTER — Ambulatory Visit: Payer: Medicaid Other | Admitting: Occupational Therapy

## 2022-01-22 ENCOUNTER — Encounter: Payer: Self-pay | Admitting: Registered Nurse

## 2022-01-22 ENCOUNTER — Encounter: Payer: Medicaid Other | Attending: Registered Nurse | Admitting: Registered Nurse

## 2022-01-22 VITALS — BP 143/98 | HR 105 | Ht 71.0 in | Wt 309.0 lb

## 2022-01-22 DIAGNOSIS — Z6841 Body Mass Index (BMI) 40.0 and over, adult: Secondary | ICD-10-CM | POA: Diagnosis not present

## 2022-01-22 DIAGNOSIS — D496 Neoplasm of unspecified behavior of brain: Secondary | ICD-10-CM | POA: Diagnosis not present

## 2022-01-22 DIAGNOSIS — R531 Weakness: Secondary | ICD-10-CM | POA: Diagnosis not present

## 2022-01-22 NOTE — Progress Notes (Signed)
Subjective:    Patient ID: Sheryl Mcmillan, female    DOB: [birth year], 46 y.o.   MRN: [id number]  BWG:[id number] Sheryl Mcmillan is a 46 y.o. female who returns for [place] appointment for follow up of her Brain Tumor, Right Sided weakness and Morbid Obesity. She presented to the emergency department with Right- Sided weakness.   Dr. Posey Pronto: H&P  on 11/07/2021  Chief Complaint: Right-sided weakness   HPI: Sheryl Mcmillan is a 46 y.o. female with medical history significant for left breast cancer (ER negative, PR positive, BRCA1 positive) s/p neoadjuvant chemotherapy, bilateral mastectomy, hysterectomy and bilateral SOO who presented to the ED for evaluation of right-sided weakness.  Patient states that about 1 week ago she began having intermittent weakness of her right upper extremity.  She was having issues holding cups and dropping objects.  At some point she had difficulty moving her right arm at all.  She then began to have weakness of her right leg and difficulty ambulating.  She says that she went to work but due to her persistent symptoms she went to the ED for further evaluation.  Neuro-surgery consulted.   CT Head: WO Contrast IMPRESSION: 2.0 x 2.1 cm parenchymal mass within the posteromedial left frontal lobe, likely reflecting a secondary (or less likely primary) neoplasm. Subtle hyperdensity within the mass, which may reflect minimal hemorrhage. Moderate surrounding vasogenic edema. Resultant mass effect with partial effacement of the left little ventricle and trace rightward midline shift. A brain MRI without and with contrast is recommended for further evaluation.   Partially empty sella turcica. While this finding often reflects incidental anatomic variation, it can also be associated with idiopathic intracranial hypertension (pseudotumor cerebri).   Left sphenoid sinus disease, as described.  MR Brain: IMPRESSION: Partially solid contrast-enhancing mass in the superior  left hemisphere with large area of vasogenic edema, most consistent with metastatic disease.  Sheryl Mcmillan underwent on 11/10/2021 LT FRONTAL CRANIOTOMY TUMOR EXCISION Left General  APPLICATION OF CRANIAL NAVIGATION       Sheryl Mcmillan was admitted to inpatient rehabilitation on 11/17/2021 and discharged home on 12/09/2021. She is receiving outpatient therapy at [facility]. She denies any pain. She rates her pain 0.   Family in room.       Pain Inventory Average Pain 0 Pain Right Now 0 My pain is  no pain  LOCATION OF PAIN  no pain  BOWEL Number of stools per week: 14-21   BLADDER Normal    Mobility use a walker Do you have any goals in this area?  yes  Function Do you have any goals in this area?  yes  Neuro/Psych trouble walking  Prior Studies Any changes since last visit?  no  Physicians involved in your care Any changes since last visit?  no   Family History  Problem Relation Age of Onset   Hypertension Mother    Diabetes Mother    Cancer Mother 28       unknown primary   Other Mother        brain tumor; dx 34s   Non-Hodgkin's lymphoma Brother 42   Other Maternal Uncle 57       brain tumor   Breast cancer Other        MGM's niece; dx late 33s   Pancreatic cancer Neg Hx    Colon cancer Neg Hx    Endometrial cancer Neg Hx    Prostate cancer Neg Hx    Ovarian cancer Neg Hx  Social History   Socioeconomic History   Marital status: Single    Spouse name: Not on file   Number of children: 1   Years of education: Not on file   Highest education level: Some college, no degree  Occupational History   Not on file  Tobacco Use   Smoking status: Former    Packs/day: 0.50    Years: 10.00    Total pack years: 5.00    Types: Cigarettes    Quit date: 08/23/2020    Years since quitting: 1.4   Smokeless tobacco: Never  Vaping Use   Vaping Use: Never used  Substance and Sexual Activity   Alcohol use: Not Currently    Comment: rare   Drug use:  Yes    Types: Marijuana    Comment: Last use 11/07/21   Sexual activity: Yes    Birth control/protection: Surgical    Comment: Hysterectomy  Other Topics Concern   Not on file  Social History Narrative   Not on file   Social Determinants of Health   Financial Resource Strain: Not on file  Food Insecurity: Not on file  Transportation Needs: No Transportation Needs (05/05/2020)   PRAPARE - Hydrologist (Medical): No    Lack of Transportation (Non-Medical): No  Physical Activity: Not on file  Stress: Not on file  Social Connections: Not on file   Past Surgical History:  Procedure Laterality Date   APPLICATION OF CRANIAL NAVIGATION Left 11/10/2021   Procedure: APPLICATION OF CRANIAL NAVIGATION;  Surgeon: Consuella Lose, MD;  Location: [place];  Service: Neurosurgery;  Laterality: Left;   AXILLARY SENTINEL NODE BIOPSY Left 11/07/2020   Procedure: LEFT AXILLARY SENTINEL NODE BIOPSY;  Surgeon: [name], MD;  Location: [place];  Service: General;  Laterality: Left;   BREAST CYST EXCISION Right 09/22/2021   Procedure: Excision of right axillary excess soft tissue.;  Surgeon: [name], MD;  Location: [place];  Service: Plastics;  Laterality: Right;   BREAST RECONSTRUCTION WITH PLACEMENT OF TISSUE EXPANDER AND FLEX HD (ACELLULAR HYDRATED DERMIS) Bilateral 11/07/2020   Procedure: BILATERAL BREAST RECONSTRUCTION WITH PLACEMENT OF TISSUE EXPANDER AND FLEX HD (ACELLULAR HYDRATED DERMIS);  Surgeon: [name], MD;  Location: [place];  Service: Plastics;  Laterality: Bilateral;   COSMETIC SURGERY     CRANIOTOMY Left 11/10/2021   Procedure: LT FRONTAL CRANIOTOMY TUMOR EXCISION;  Surgeon: Consuella Lose, MD;  Location: [place];  Service: Neurosurgery;  Laterality: Left;   DILATION AND CURETTAGE OF UTERUS     HEMORRHOID SURGERY     IR IMAGING GUIDED PORT INSERTION  06/15/2020   LIPOSUCTION WITH LIPOFILLING Bilateral 09/22/2021    Procedure: Fat grafting bilateral breast;  Surgeon: [name], MD;  Location: [place];  Service: Plastics;  Laterality: Bilateral;   MASS EXCISION Bilateral 04/25/2021   Procedure: excision of bilateral axillary breast tissue;  Surgeon: [name], MD;  Location: [place];  Service: Plastics;  Laterality: Bilateral;   PORT-A-CATH REMOVAL Right 11/07/2020   Procedure: REMOVAL PORT-A-CATH;  Surgeon: [name], MD;  Location: [place];  Service: General;  Laterality: Right;   REMOVAL OF BILATERAL TISSUE EXPANDERS WITH PLACEMENT OF BILATERAL BREAST IMPLANTS Bilateral 04/25/2021   Procedure: REMOVAL OF BILATERAL TISSUE EXPANDERS WITH PLACEMENT OF BILATERAL BREAST IMPLANTS;  Surgeon: [name], MD;  Location: [place];  Service: Plastics;  Laterality: Bilateral;   ROBOTIC ASSISTED TOTAL HYSTERECTOMY WITH BILATERAL  SALPINGO OOPHERECTOMY Bilateral 03/14/2021   Procedure: XI ROBOTIC ASSISTED TOTAL HYSTERECTOMY WITH BILATERAL SALPINGO OOPHORECTOMY;  Surgeon: Everitt Amber, MD;  Location: [facility];  Service: Gynecology;  Laterality: Bilateral;   TOTAL MASTECTOMY Bilateral 11/07/2020   Procedure: BILATERAL MASTECTOMY;  Surgeon: [name], MD;  Location: [place];  Service: General;  Laterality: Bilateral;   Past Medical History:  Diagnosis Date   Arthritis    Breast cancer ([place])    Family history of non-Hodgkin's lymphoma 06/01/2020   GERD (gastroesophageal reflux disease)    Hemorrhoids    Migraines    PCOS (polycystic ovarian syndrome)    PONV (postoperative nausea and vomiting)    BP (!) 143/98   Pulse (!) 105   Ht 5' 11"  (1.803 m)   Wt (!) 309 lb (140.2 kg)   LMP 06/08/2020   SpO2 98%   BMI 43.10 kg/m   Opioid Risk Score:   Fall Risk Score:  `1  Depression screen PHQ 2/9      No data to display           Review of Systems  Constitutional: Negative.   HENT: Negative.    Eyes: Negative.   Respiratory:  Negative.    Cardiovascular: Negative.   Gastrointestinal: Negative.   Endocrine: Negative.   Genitourinary: Negative.   Musculoskeletal:  Positive for gait problem.  Skin: Negative.   Allergic/Immunologic: Negative.   Hematological: Negative.   Psychiatric/Behavioral: Negative.        Objective:   Physical Exam Vitals and nursing note reviewed.  Constitutional:      Appearance: Normal appearance. She is obese.  Cardiovascular:     Rate and Rhythm: Regular rhythm. Tachycardia present.     Pulses: Normal pulses.     Heart sounds: Normal heart sounds.  Pulmonary:     Effort: Pulmonary effort is normal.     Breath sounds: Normal breath sounds.  Musculoskeletal:     Cervical back: Normal range of motion and neck supple.     Comments: Normal Muscle Bulk and Muscle Testing Reveals:  Upper Extremities: Full ROM and Muscle Strength on Right 4/5 and Left 5/5  Lower Extremities : Full ROM and Muscle Strength 5/5 Wearing Right AFO Arises from Table slowly using walker for support Narrow Based Gait     Skin:    General: Skin is warm and dry.  Neurological:     Mental Status: She is oriented to person, place, and time.  Psychiatric:        Mood and Affect: Mood normal.        Behavior: Behavior normal.          Assessment & Plan:  Brain Tumor/ Right Sided weakness: Neurosurgery  and Oncology Following.  S/P on 11/10/2021 by Dr Jackelyn Hoehn :       Procedure Laterality Anesthesia  LT FRONTAL CRANIOTOMY TUMOR EXCISION Left General  APPLICATION OF CRANIAL NAVIGATION       2. Morbid Obesity. Continue Healthy Diet Regimen. Continue to Monitor.   F/U with Dr Ranell Patrick in 4-6 weeks.

## 2022-01-23 ENCOUNTER — Ambulatory Visit: Payer: Medicaid Other | Attending: General Surgery

## 2022-01-23 DIAGNOSIS — R293 Abnormal posture: Secondary | ICD-10-CM | POA: Diagnosis present

## 2022-01-23 DIAGNOSIS — R2681 Unsteadiness on feet: Secondary | ICD-10-CM | POA: Insufficient documentation

## 2022-01-23 DIAGNOSIS — M6281 Muscle weakness (generalized): Secondary | ICD-10-CM | POA: Diagnosis present

## 2022-01-23 DIAGNOSIS — R278 Other lack of coordination: Secondary | ICD-10-CM | POA: Insufficient documentation

## 2022-01-23 DIAGNOSIS — R2689 Other abnormalities of gait and mobility: Secondary | ICD-10-CM | POA: Diagnosis present

## 2022-01-23 DIAGNOSIS — Z483 Aftercare following surgery for neoplasm: Secondary | ICD-10-CM | POA: Diagnosis present

## 2022-01-23 DIAGNOSIS — C7931 Secondary malignant neoplasm of brain: Secondary | ICD-10-CM | POA: Diagnosis present

## 2022-01-23 NOTE — Therapy (Signed)
OUTPATIENT PHYSICAL THERAPY NEURO TREATMENT   Patient Name: Sheryl Mcmillan MRN: [id number] DOB:22-Jan-1976, 46 y.o., female Today's Date: 01/23/2022   PCP: Windell Hummingbird, PA-C REFERRING PROVIDER: Marlowe Shores, PA-C (to f/u with Izora Ribas, MD)   PT End of Session - 01/23/22 1232     Visit Number 7    Number of Visits 17    Date for PT Re-Evaluation 02/07/22    Authorization Type Medicaid [employer] 2023 (?auth required after 12 visits-combined)    PT Start Time 1230    PT Stop Time 1312    PT Time Calculation (min) 42 min    Equipment Utilized During Treatment Gait belt    Activity Tolerance Patient tolerated treatment well    Behavior During Therapy WFL for tasks assessed/performed                   Past Medical History:  Diagnosis Date   Arthritis    Breast cancer ([place])    Family history of non-Hodgkin's lymphoma 06/01/2020   GERD (gastroesophageal reflux disease)    Hemorrhoids    Migraines    PCOS (polycystic ovarian syndrome)    PONV (postoperative nausea and vomiting)    Past Surgical History:  Procedure Laterality Date   APPLICATION OF CRANIAL NAVIGATION Left 11/10/2021   Procedure: APPLICATION OF CRANIAL NAVIGATION;  Surgeon: Consuella Lose, MD;  Location: [place];  Service: Neurosurgery;  Laterality: Left;   AXILLARY SENTINEL NODE BIOPSY Left 11/07/2020   Procedure: LEFT AXILLARY SENTINEL NODE BIOPSY;  Surgeon: [name], MD;  Location: [place];  Service: General;  Laterality: Left;   BREAST CYST EXCISION Right 09/22/2021   Procedure: Excision of right axillary excess soft tissue.;  Surgeon: [name], MD;  Location: [place];  Service: Plastics;  Laterality: Right;   BREAST RECONSTRUCTION WITH PLACEMENT OF TISSUE EXPANDER AND FLEX HD (ACELLULAR HYDRATED DERMIS) Bilateral 11/07/2020   Procedure: BILATERAL BREAST RECONSTRUCTION WITH PLACEMENT OF TISSUE EXPANDER AND FLEX HD (ACELLULAR HYDRATED DERMIS);  Surgeon: [name], MD;  Location: [place];  Service: Plastics;  Laterality: Bilateral;   COSMETIC SURGERY     CRANIOTOMY Left 11/10/2021   Procedure: LT FRONTAL CRANIOTOMY TUMOR EXCISION;  Surgeon: Consuella Lose, MD;  Location: [place];  Service: Neurosurgery;  Laterality: Left;   DILATION AND CURETTAGE OF UTERUS     HEMORRHOID SURGERY     IR IMAGING GUIDED PORT INSERTION  06/15/2020   LIPOSUCTION WITH LIPOFILLING Bilateral 09/22/2021   Procedure: Fat grafting bilateral breast;  Surgeon: [name], MD;  Location: [place];  Service: Plastics;  Laterality: Bilateral;   MASS EXCISION Bilateral 04/25/2021   Procedure: excision of bilateral axillary breast tissue;  Surgeon: [name], MD;  Location: [place];  Service: Plastics;  Laterality: Bilateral;   PORT-A-CATH REMOVAL Right 11/07/2020   Procedure: REMOVAL PORT-A-CATH;  Surgeon: [name], MD;  Location: [place];  Service: General;  Laterality: Right;   REMOVAL OF BILATERAL TISSUE EXPANDERS WITH PLACEMENT OF BILATERAL BREAST IMPLANTS Bilateral 04/25/2021   Procedure: REMOVAL OF BILATERAL TISSUE EXPANDERS WITH PLACEMENT OF BILATERAL BREAST IMPLANTS;  Surgeon: [name], MD;  Location: [place];  Service: Plastics;  Laterality: Bilateral;   ROBOTIC ASSISTED TOTAL HYSTERECTOMY WITH BILATERAL SALPINGO OOPHERECTOMY Bilateral 03/14/2021   Procedure: XI ROBOTIC ASSISTED TOTAL HYSTERECTOMY WITH BILATERAL SALPINGO OOPHORECTOMY;  Surgeon: Everitt Amber, MD;  [facility];  Service: Gynecology;  Laterality: Bilateral;   TOTAL  MASTECTOMY Bilateral 11/07/2020   Procedure: BILATERAL MASTECTOMY;  Surgeon: [name], MD;  Location: [place];  Service: General;  Laterality: Bilateral;   Patient Active Problem List   Diagnosis Date Noted   Brain tumor ([place]) 11/08/2021   Metastatic cancer to brain ([place]) 11/08/2021   Cerebral edema ([place]) 11/08/2021   Complex partial seizure ([place]) 11/08/2021    Brain mass 11/07/2021   Right sided weakness 11/07/2021   IDA (iron deficiency anemia) 07/18/2021   Cholelithiasis 03/14/2021   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) 07/19/2020   BRCA1 gene mutation positive 07/04/2020   Genetic testing 06/28/2020   Family history of non-Hodgkin's lymphoma 06/01/2020   Malignant neoplasm of upper-outer quadrant of left breast in female, estrogen receptor positive ([place]) 05/26/2020    ONSET DATE: 12/05/2021 (MD referral)  REFERRING DIAG: D49.6 (ICD-10-CM) - Neoplasm of unspecified behavior of brain  THERAPY DIAG:  Unsteadiness on feet  Other lack of coordination  Other abnormalities of gait and mobility  Aftercare following surgery for neoplasm  Muscle weakness (generalized)  Abnormal posture  Metastatic cancer to brain [facility])  Rationale for Evaluation and Treatment Rehabilitation  SUBJECTIVE:                                                                                                                                                                                              SUBJECTIVE STATEMENT: I fell real bad, I was just walking in my house without the walker and no AFO. Not sure what happened, but I think my knee gave out. Had to call the ambulance to help me up. I am not comfortable without the walker anymore. No falls since. 4/10 in R hip and low back.   Pt accompanied by: self  PERTINENT HISTORY: metastatic breast cancer, brain tumor s/p excision with craniotomy; GERD, morbid obesity.  Admitted to rehab ([facility]) 11/17/21 and d/c home 12/09/2021  PAIN:  Are you having pain? No  PRECAUTIONS: Fall and Other: wearing R GRAFO, no driving  FALLS: Has patient fallen in last 6 months? 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 Now s/p L frontal crani tumor excision 5/19.  COGNITION: Overall cognitive status: Within functional limits for tasks assessed   SENSATION:  Just starting to get sensation back on bottom of R foot Light touch: WFL  COORDINATION: Decreased coordination RLE-needs assist of LLE or UEs to place RLE   EDEMA:  Noted in RLE-pt reports   MUSCLE TONE: RLE: Hypotonic   POSTURE: rounded shoulders  LOWER EXTREMITY ROM:     Passive  Right Eval Left Eval  Hip flexion    Hip extension    Hip abduction    Hip adduction    Hip internal rotation    Hip external rotation    Knee flexion    Knee extension    Ankle dorsiflexion -10 from neutral   Ankle plantarflexion    Ankle inversion    Ankle eversion     (Blank rows = not tested)  LOWER EXTREMITY MMT:    MMT Right Eval Left Eval  Hip flexion 3-/5 4/5  Hip extension    Hip abduction 3+/5 4/5  Hip adduction 3+/5 4/5  Hip internal rotation    Hip external rotation    Knee flexion 3-/5 4+/5  Knee extension 3-/5 4+/5  Ankle dorsiflexion trace 5/5  Ankle plantarflexion    Ankle inversion    Ankle eversion    (Blank rows = not tested)   TRANSFERS: Assistive device utilized: Environmental consultant - 2 wheeled  Sit to stand: SBA Stand to sit: SBA  GAIT: Gait pattern: step through pattern, decreased step length- Left, decreased stance time- Right, decreased ankle dorsiflexion- Right, and genu recurvatum- Right Distance walked: 60 ft  Assistive device utilized: Walker - 2 wheeled and R AFO Level of assistance:  SBA Comments: Pt is able to don R AFO  FUNCTIONAL TESTs:  5 times sit to stand: 29.13 Timed up and go (TUG): 42.62 Gait velocity:  23.87 sec = 1.37 ft/sec 5x sit<>stand performed with UE support  PATIENT SURVEYS:  FOTO N/T  TREATMENT 01/23/22 Nustep L5 x2mns STS with OHP yellow 2x5 Step ups on Airex in RW 2x5 each side Resisted gait 20# forwards and side steps x3 Standing hip abd/ext 5# 2x10     01/12/22 Nustep L5 x696ms  Ankle ROM on disc Walking w/o AFO Side step ups 2HHA SL cone taps with RLE STS with OHP yellow ball x10  Resisted gait 20# forwards and side steps x3  01/09/22  Nustep L3 x6m44m Walking without AD  Fitter 2x10  Step ups 6"  Knee ext 10# bilat R eccentric, 5# RLE only x10 HS curls 5# x10 RLE only   01/05/22 Bike L3 x6mi23mReassess BERG  Walking w/o AD in // bars w/AFO (forwards and side steps) Standing ankle ROM on rocker w/o AFO x10 DF/PF and IV/EV Heel taps on 6" 20reps Gastroc stretch seated w/strap 30s each  Stair training    01/03/22 Nustep L3 x5min64meight shifts in // bars 2 way hip 2# 2x10 Standing marches in // bars 2# 2x10  LAQ 2# 2x10 RLE  STS elevated mat 2x10    Patient's gait assessed without AFO, agree she doe snot need to use it when indoors, but recommend using it when out of the house for safety due to fatigue. BERG assessed-41/56 Practiced forward weight shifts over RLE without hyperextension at parallel bars, difficutly Wbing with R knee control.  PATIENT EDUCATION: Education details: PT POC, Eval results, initial HEP Person  educated: Patient Education method: Explanation, Demonstration, and Handouts Education comprehension: verbalized understanding and returned demonstration   HOME EXERCISE PROGRAM: Access Code: [email or web address] URL: [email or web address] Date: 12/13/2021 Prepared by: [facility]  Exercises - Seated Heel Slide  - 1-2 x daily - 7 x weekly - 1 sets -  5-10 reps - 10-15 sec hold - Sit to Stand with Counter Support  - 1-2 x daily - 7 x weekly - 1-2 sets - 5 reps - Seated Gluteal Sets  - 1-2 x daily - 7 x weekly - 1-2 sets - 10 reps - 3 sec hold    GOALS: Goals reviewed with patient? Yes  SHORT TERM GOALS: Target date: 01/10/2022  Pt will be independent with HEP for improved strength, balance, gait. Baseline:  Initial HEP from [facility] Goal status: ongoing  2.  Pt will improve 5x sit<>stand to less than or equal to 20 sec to demonstrate improved functional strength and transfer efficiency. Baseline: 29.13 sec with BUE support (scores >15 sec indicate fall risk) Goal status: INITIAL  3.  Berg score to be assessed with goal to be written as appropriate. Baseline: Pt at increased fall risk per 5x sit<>stand and TUG Goal status: INITIAL   LONG TERM GOALS: Target date: 02/07/2022  Pt will be independent with HEP for improved strength, balance, transfers, and gait. Baseline:  Initial HEP from CIR Goal status: INITIAL  2.  Pt will improve 5x sit<>stand to less than or equal to 15 sec to demonstrate improved functional strength and transfer efficiency. Baseline: 29.13 sec with BUE support (scores >15 sec indicate fall risk) Goal status: INITIAL  3.  Pt will improve TUG score to less than or equal to 25 sec for decreased fall risk. Baseline: 42.62 sec (indicates increased fall risk and difficulty with ADLs in home) Goal status: INITIAL  4.  Pt will improve gait velocity to at least 1.8 ft/sec for improved gait efficiency and safety. Baseline: 1.37 ft/sec (1.31-2.62 indicates increased limited community ambulator) Goal status: INITIAL  5.  FOTO score to be assessed and score to improve to predicted level at d/c, indicating improved overall functional mobility. Baseline: not assessed due to time constraints at eval Goal status: INITIAL  ASSESSMENT:  CLINICAL IMPRESSION: Patient is upset about her fall and feels like it set her back.  She was reassured that we can get her back to where she was and will continue to rebuild her confidence and strength to be able to return to her PLOF. Hip exercises difficulty with RLE because of decreased control. She is moving slower today and is more fatigued throughout session. Continue with gait and balance training.   OBJECTIVE IMPAIRMENTS Abnormal gait, decreased balance, decreased mobility, difficulty walking, decreased ROM, decreased strength, impaired flexibility, and impaired tone.   ACTIVITY LIMITATIONS standing, transfers, locomotion level, and caring for others  PARTICIPATION LIMITATIONS: meal prep, driving, shopping, community activity, and occupation  PERSONAL FACTORS 3+ comorbidities: see above for PMH  are also affecting patient's functional outcome.   REHAB POTENTIAL: Good  CLINICAL DECISION MAKING: Evolving/moderate complexity  EVALUATION COMPLEXITY: Moderate  PLAN: PT FREQUENCY: 2x/week  PT DURATION: 8 weeks plus eval  PLANNED INTERVENTIONS: Therapeutic exercises, Therapeutic activity, Neuromuscular re-education, Balance training, Gait training, Patient/Family education, Joint mobilization, Orthotic/Fit training, DME instructions, Aquatic Therapy, and Manual therapy  PLAN FOR NEXT SESSION:  Work on R ankle range/flexibility in standing, quad/hamstring strength, gait training w/o AFO   Andris Baumann, DPT 01/23/2022, 1:14 PM  [place] [facility]

## 2022-01-24 NOTE — Therapy (Signed)
OUTPATIENT PHYSICAL THERAPY NEURO TREATMENT   Patient Name: Sheryl Mcmillan MRN: [id number] DOB:July 27, 1975, 46 y.o., female Today's Date: 01/25/2022   PCP: Windell Hummingbird, PA-C REFERRING PROVIDER: Marlowe Shores, PA-C (to f/u with Izora Ribas, MD)   PT End of Session - 01/25/22 1231     Visit Number 8    Number of Visits 17    Date for PT Re-Evaluation 02/07/22    Authorization Type Medicaid [employer] 2023 (?auth required after 12 visits-combined)    PT Start Time 1230    PT Stop Time 1315    PT Time Calculation (min) 45 min    Equipment Utilized During Treatment Gait belt    Activity Tolerance Patient tolerated treatment well    Behavior During Therapy WFL for tasks assessed/performed                   Past Medical History:  Diagnosis Date   Arthritis    Breast cancer ([place])    Family history of non-Hodgkin's lymphoma 06/01/2020   GERD (gastroesophageal reflux disease)    Hemorrhoids    Migraines    PCOS (polycystic ovarian syndrome)    PONV (postoperative nausea and vomiting)    Past Surgical History:  Procedure Laterality Date   APPLICATION OF CRANIAL NAVIGATION Left 11/10/2021   Procedure: APPLICATION OF CRANIAL NAVIGATION;  Surgeon: Consuella Lose, MD;  Location: [place];  Service: Neurosurgery;  Laterality: Left;   AXILLARY SENTINEL NODE BIOPSY Left 11/07/2020   Procedure: LEFT AXILLARY SENTINEL NODE BIOPSY;  Surgeon: [name], MD;  Location: [place];  Service: General;  Laterality: Left;   BREAST CYST EXCISION Right 09/22/2021   Procedure: Excision of right axillary excess soft tissue.;  Surgeon: [name], MD;  Location: [place];  Service: Plastics;  Laterality: Right;   BREAST RECONSTRUCTION WITH PLACEMENT OF TISSUE EXPANDER AND FLEX HD (ACELLULAR HYDRATED DERMIS) Bilateral 11/07/2020   Procedure: BILATERAL BREAST RECONSTRUCTION WITH PLACEMENT OF TISSUE EXPANDER AND FLEX HD (ACELLULAR HYDRATED DERMIS);  Surgeon: [name], MD;  Location: [place];  Service: Plastics;  Laterality: Bilateral;   COSMETIC SURGERY     CRANIOTOMY Left 11/10/2021   Procedure: LT FRONTAL CRANIOTOMY TUMOR EXCISION;  Surgeon: Consuella Lose, MD;  Location: [place];  Service: Neurosurgery;  Laterality: Left;   DILATION AND CURETTAGE OF UTERUS     HEMORRHOID SURGERY     IR IMAGING GUIDED PORT INSERTION  06/15/2020   LIPOSUCTION WITH LIPOFILLING Bilateral 09/22/2021   Procedure: Fat grafting bilateral breast;  Surgeon: [name], MD;  Location: [place];  Service: Plastics;  Laterality: Bilateral;   MASS EXCISION Bilateral 04/25/2021   Procedure: excision of bilateral axillary breast tissue;  Surgeon: [name], MD;  Location: [place];  Service: Plastics;  Laterality: Bilateral;   PORT-A-CATH REMOVAL Right 11/07/2020   Procedure: REMOVAL PORT-A-CATH;  Surgeon: [name], MD;  Location: [place];  Service: General;  Laterality: Right;   REMOVAL OF BILATERAL TISSUE EXPANDERS WITH PLACEMENT OF BILATERAL BREAST IMPLANTS Bilateral 04/25/2021   Procedure: REMOVAL OF BILATERAL TISSUE EXPANDERS WITH PLACEMENT OF BILATERAL BREAST IMPLANTS;  Surgeon: [name], MD;  Location: [place];  Service: Plastics;  Laterality: Bilateral;   ROBOTIC ASSISTED TOTAL HYSTERECTOMY WITH BILATERAL SALPINGO OOPHERECTOMY Bilateral 03/14/2021   Procedure: XI ROBOTIC ASSISTED TOTAL HYSTERECTOMY WITH BILATERAL SALPINGO OOPHORECTOMY;  Surgeon: Everitt Amber, MD;  Location: [facility];  Service: Gynecology;  Laterality: Bilateral;   TOTAL  MASTECTOMY Bilateral 11/07/2020   Procedure: BILATERAL MASTECTOMY;  Surgeon: [name], MD;  Location: [place];  Service: General;  Laterality: Bilateral;   Patient Active Problem List   Diagnosis Date Noted   Brain tumor ([place]) 11/08/2021   Metastatic cancer to brain ([place]) 11/08/2021   Cerebral edema ([place]) 11/08/2021   Complex partial seizure ([place]) 11/08/2021    Brain mass 11/07/2021   Right sided weakness 11/07/2021   IDA (iron deficiency anemia) 07/18/2021   Cholelithiasis 03/14/2021   Peripheral neuropathy due to chemotherapy ([place]) 10/20/2020   Morbid obesity with BMI of 40.0-44.9, adult ([place]) 07/19/2020   BRCA1 gene mutation positive 07/04/2020   Genetic testing 06/28/2020   Family history of non-Hodgkin's lymphoma 06/01/2020   Malignant neoplasm of upper-outer quadrant of left breast in female, estrogen receptor positive ([place]) 05/26/2020    ONSET DATE: 12/05/2021 (MD referral)  REFERRING DIAG: D49.6 (ICD-10-CM) - Neoplasm of unspecified behavior of brain  THERAPY DIAG:  Unsteadiness on feet  Other lack of coordination  Other abnormalities of gait and mobility  Muscle weakness (generalized)  Aftercare following surgery for neoplasm  Abnormal posture  Metastatic cancer to brain [facility])  Rationale for Evaluation and Treatment Rehabilitation  SUBJECTIVE:                                                                                                                                                                                              SUBJECTIVE STATEMENT: I am good, nothing new to report. Wearing regular shoes and not wearing AFO.   Pt accompanied by: self  PERTINENT HISTORY: metastatic breast cancer, brain tumor s/p excision with craniotomy; GERD, morbid obesity.  Admitted to rehab ([facility]) 11/17/21 and d/c home 12/09/2021  PAIN:  Are you having pain? No  PRECAUTIONS: Fall and Other: wearing R GRAFO, no driving  FALLS: Has patient fallen in last 6 months? 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 Now s/p L frontal crani tumor excision 5/19.  COGNITION: Overall cognitive status: Within functional limits for tasks assessed   SENSATION:  Just starting to get sensation back  on bottom of R foot Light touch: WFL  COORDINATION: Decreased coordination RLE-needs assist of LLE or UEs to place RLE   EDEMA:  Noted in RLE-pt reports   MUSCLE TONE: RLE: Hypotonic   POSTURE: rounded shoulders  LOWER EXTREMITY ROM:     Passive  Right Eval Left Eval  Hip flexion    Hip extension    Hip abduction    Hip adduction    Hip internal rotation    Hip external rotation    Knee flexion    Knee extension    Ankle dorsiflexion -10 from neutral   Ankle plantarflexion    Ankle inversion    Ankle eversion     (Blank rows = not tested)  LOWER EXTREMITY MMT:    MMT Right Eval Left Eval  Hip flexion 3-/5 4/5  Hip extension    Hip abduction 3+/5 4/5  Hip adduction 3+/5 4/5  Hip internal rotation    Hip external rotation    Knee flexion 3-/5 4+/5  Knee extension 3-/5 4+/5  Ankle dorsiflexion trace 5/5  Ankle plantarflexion    Ankle inversion    Ankle eversion    (Blank rows = not tested)   TRANSFERS: Assistive device utilized: Environmental consultant - 2 wheeled  Sit to stand: SBA Stand to sit: SBA  GAIT: Gait pattern: step through pattern, decreased step length- Left, decreased stance time- Right, decreased ankle dorsiflexion- Right, and genu recurvatum- Right Distance walked: 60 ft  Assistive device utilized: Walker - 2 wheeled and R AFO Level of assistance: SBA Comments: Pt is able to don R AFO  FUNCTIONAL TESTs:  5 times sit to stand: 29.13 Timed up and go (TUG): 42.62 Gait velocity:  23.87 sec = 1.37 ft/sec 5x sit<>stand performed with UE  support  PATIENT SURVEYS:  FOTO N/T  TREATMENT 01/25/22 Nustep L5x54mns Ankle rockers DF/PF Ankle redTB x10 DF/PF  Knee ext 15# 2x10 up w/BLE but down on RLE, 10# RLE  x10 Knee flexion 25# 2x10, 10# RLE x10    01/23/22 Nustep L5 x637ms STS with OHP yellow 2x5 Step ups on Airex in RW 2x5 each side Resisted gait 20# forwards and side steps x3 Standing hip abd/ext 5# 2x10     01/12/22 Nustep L5 x6m86m  Ankle ROM on disc Walking w/o AFO Side step ups 2HHA SL cone taps with RLE STS with OHP yellow ball x10  Resisted gait 20# forwards and side steps x3  01/09/22  Nustep L3 x6mi58mWalking without AD  Fitter 2x10  Step ups 6"  Knee ext 10# bilat R eccentric, 5# RLE only x10 HS curls 5# x10 RLE only   01/05/22 Bike L3 x6min48meassess BERG  Walking w/o AD in // bars w/AFO (forwards and side steps) Standing ankle ROM on rocker w/o AFO x10 DF/PF and IV/EV Heel taps on 6" 20reps Gastroc stretch seated w/strap 30s each  Stair training    01/03/22 Nustep L3 x5mins50might shifts in // bars 2 way hip 2# 2x10 Standing marches in // bars 2# 2x10  LAQ 2# 2x10 RLE  STS elevated mat 2x10    Patient's gait assessed without AFO, agree she doe snot need to use it when indoors, but recommend using it when out of the house for safety due to fatigue. BERG assessed-41/56 Practiced forward weight shifts over RLE without hyperextension at parallel bars, difficutly Wbing with R knee control.  PATIENT EDUCATION: Education details: PT POC, Eval results, initial HEP Person educated: Patient Education method: Explanation, Demonstration,  and Handouts Education comprehension: verbalized understanding and returned demonstration   HOME EXERCISE PROGRAM: Access Code: [email or web address] URL: [email or web address] Date: 12/13/2021 Prepared by: [facility]  Exercises - Seated Heel Slide  - 1-2 x daily - 7 x weekly - 1 sets - 5-10 reps - 10-15 sec  hold - Sit to Stand with Counter Support  - 1-2 x daily - 7 x weekly - 1-2 sets - 5 reps - Seated Gluteal Sets  - 1-2 x daily - 7 x weekly - 1-2 sets - 10 reps - 3 sec hold    GOALS: Goals reviewed with patient? Yes  SHORT TERM GOALS: Target date: 01/10/2022  Pt will be independent with HEP for improved strength, balance, gait. Baseline:  Initial HEP from CIR Goal status: ongoing  2.  Pt will improve 5x sit<>stand to less than or equal to 20 sec to demonstrate improved functional strength and transfer efficiency. Baseline: 29.13 sec with BUE support (scores >15 sec indicate fall risk) Goal status: INITIAL  3.  Berg score to be assessed with goal to be written as appropriate. Baseline: Pt at increased fall risk per 5x sit<>stand and TUG Goal status: INITIAL   LONG TERM GOALS: Target date: 02/07/2022  Pt will be independent with HEP for improved strength, balance, transfers, and gait. Baseline:  Initial HEP from CIR Goal status: INITIAL  2.  Pt will improve 5x sit<>stand to less than or equal to 15 sec to demonstrate improved functional strength and transfer efficiency. Baseline: 29.13 sec with BUE support (scores >15 sec indicate fall risk) Goal status: INITIAL  3.  Pt will improve TUG score to less than or equal to 25 sec for decreased fall risk. Baseline: 42.62 sec (indicates increased fall risk and difficulty with ADLs in home) Goal status: INITIAL  4.  Pt will improve gait velocity to at least 1.8 ft/sec for improved gait efficiency and safety. Baseline: 1.37 ft/sec (1.31-2.62 indicates increased limited community ambulator) Goal status: INITIAL  5.  FOTO score to be assessed and score to improve to predicted level at d/c, indicating improved overall functional mobility. Baseline: not assessed due to time constraints at eval Goal status: INITIAL  ASSESSMENT:  CLINICAL IMPRESSION: Patient demonstrate good improvement with R ankle, is able to do ankle rockers for the  first time. Still limited in DF. Unable to do TB activity into inversion and eversion because she still does not have the mobility. Patient able to do one lap in gym with HHA to increase speed. Continue with gait and balance training.   OBJECTIVE IMPAIRMENTS Abnormal gait, decreased balance, decreased mobility, difficulty walking, decreased ROM, decreased strength, impaired flexibility, and impaired tone.   ACTIVITY LIMITATIONS standing, transfers, locomotion level, and caring for others  PARTICIPATION LIMITATIONS: meal prep, driving, shopping, community activity, and occupation  PERSONAL FACTORS 3+ comorbidities: see above for PMH  are also affecting patient's functional outcome.   REHAB POTENTIAL: Good  CLINICAL DECISION MAKING: Evolving/moderate complexity  EVALUATION COMPLEXITY: Moderate  PLAN: PT FREQUENCY: 2x/week  PT DURATION: 8 weeks plus eval  PLANNED INTERVENTIONS: Therapeutic exercises, Therapeutic activity, Neuromuscular re-education, Balance training, Gait training, Patient/Family education, Joint mobilization, Orthotic/Fit training, DME instructions, Aquatic Therapy, and Manual therapy  PLAN FOR NEXT SESSION: quad/hamstring strengthening, gait training w/o AFO and AD, balance on airex    Andris Baumann, DPT 01/25/2022, 1:16 PM    [facility]

## 2022-01-25 ENCOUNTER — Ambulatory Visit: Payer: Medicaid Other

## 2022-01-25 ENCOUNTER — Ambulatory Visit: Payer: Medicaid Other | Admitting: Occupational Therapy

## 2022-01-25 DIAGNOSIS — Z483 Aftercare following surgery for neoplasm: Secondary | ICD-10-CM

## 2022-01-25 DIAGNOSIS — R2681 Unsteadiness on feet: Secondary | ICD-10-CM

## 2022-01-25 DIAGNOSIS — M6281 Muscle weakness (generalized): Secondary | ICD-10-CM

## 2022-01-25 DIAGNOSIS — R278 Other lack of coordination: Secondary | ICD-10-CM

## 2022-01-25 DIAGNOSIS — R293 Abnormal posture: Secondary | ICD-10-CM

## 2022-01-25 DIAGNOSIS — R2689 Other abnormalities of gait and mobility: Secondary | ICD-10-CM

## 2022-01-25 DIAGNOSIS — C7931 Secondary malignant neoplasm of brain: Secondary | ICD-10-CM

## 2022-01-31 ENCOUNTER — Encounter: Payer: Self-pay | Admitting: Physical Therapy

## 2022-01-31 ENCOUNTER — Ambulatory Visit: Payer: Medicaid Other | Admitting: Physical Therapy

## 2022-01-31 DIAGNOSIS — R2689 Other abnormalities of gait and mobility: Secondary | ICD-10-CM

## 2022-01-31 DIAGNOSIS — R2681 Unsteadiness on feet: Secondary | ICD-10-CM | POA: Diagnosis not present

## 2022-01-31 DIAGNOSIS — M6281 Muscle weakness (generalized): Secondary | ICD-10-CM

## 2022-01-31 DIAGNOSIS — R278 Other lack of coordination: Secondary | ICD-10-CM

## 2022-01-31 NOTE — Therapy (Signed)
OUTPATIENT PHYSICAL THERAPY NEURO TREATMENT   Patient Name: Sheryl Mcmillan MRN: [id number] DOB:1976-01-01, 46 y.o., female Today's Date: 01/31/2022   PCP: Windell Hummingbird, PA-C REFERRING PROVIDER: Marlowe Shores, PA-C (to f/u with Izora Ribas, MD)   PT End of Session - 01/31/22 1113     Visit Number 9    Number of Visits 17    Date for PT Re-Evaluation 02/07/22    Authorization Type Medicaid [employer] 2023 (?auth required after 12 visits-combined)    PT Start Time 1059    PT Stop Time 1143    PT Time Calculation (min) 44 min    Activity Tolerance Patient tolerated treatment well    Behavior During Therapy [facility] for tasks assessed/performed                   Past Medical History:  Diagnosis Date   Arthritis    Breast cancer ([place])    Family history of non-Hodgkin's lymphoma 06/01/2020   GERD (gastroesophageal reflux disease)    Hemorrhoids    Migraines    PCOS (polycystic ovarian syndrome)    PONV (postoperative nausea and vomiting)    Past Surgical History:  Procedure Laterality Date   APPLICATION OF CRANIAL NAVIGATION Left 11/10/2021   Procedure: APPLICATION OF CRANIAL NAVIGATION;  Surgeon: Consuella Lose, MD;  Location: [place];  Service: Neurosurgery;  Laterality: Left;   AXILLARY SENTINEL NODE BIOPSY Left 11/07/2020   Procedure: LEFT AXILLARY SENTINEL NODE BIOPSY;  Surgeon: [name], MD;  Location: [place];  Service: General;  Laterality: Left;   BREAST CYST EXCISION Right 09/22/2021   Procedure: Excision of right axillary excess soft tissue.;  Surgeon: [name], MD;  Location: [place];  Service: Plastics;  Laterality: Right;   BREAST RECONSTRUCTION WITH PLACEMENT OF TISSUE EXPANDER AND FLEX HD (ACELLULAR HYDRATED DERMIS) Bilateral 11/07/2020   Procedure: BILATERAL BREAST RECONSTRUCTION WITH PLACEMENT OF TISSUE EXPANDER AND FLEX HD (ACELLULAR HYDRATED DERMIS);  Surgeon: [name], MD;  Location: [place];  Service: Plastics;   Laterality: Bilateral;   COSMETIC SURGERY     CRANIOTOMY Left 11/10/2021   Procedure: LT FRONTAL CRANIOTOMY TUMOR EXCISION;  Surgeon: Consuella Lose, MD;  Location: [place];  Service: Neurosurgery;  Laterality: Left;   DILATION AND CURETTAGE OF UTERUS     HEMORRHOID SURGERY     IR IMAGING GUIDED PORT INSERTION  06/15/2020   LIPOSUCTION WITH LIPOFILLING Bilateral 09/22/2021   Procedure: Fat grafting bilateral breast;  Surgeon: [name], MD;  Location: [place];  Service: Plastics;  Laterality: Bilateral;   MASS EXCISION Bilateral 04/25/2021   Procedure: excision of bilateral axillary breast tissue;  Surgeon: [name], MD;  Location: [place];  Service: Plastics;  Laterality: Bilateral;   PORT-A-CATH REMOVAL Right 11/07/2020   Procedure: REMOVAL PORT-A-CATH;  Surgeon: [name], MD;  Location: [place];  Service: General;  Laterality: Right;   REMOVAL OF BILATERAL TISSUE EXPANDERS WITH PLACEMENT OF BILATERAL BREAST IMPLANTS Bilateral 04/25/2021   Procedure: REMOVAL OF BILATERAL TISSUE EXPANDERS WITH PLACEMENT OF BILATERAL BREAST IMPLANTS;  Surgeon: [name], MD;  Location: [place];  Service: Plastics;  Laterality: Bilateral;   ROBOTIC ASSISTED TOTAL HYSTERECTOMY WITH BILATERAL SALPINGO OOPHERECTOMY Bilateral 03/14/2021   Procedure: XI ROBOTIC ASSISTED TOTAL HYSTERECTOMY WITH BILATERAL SALPINGO OOPHORECTOMY;  Surgeon: Everitt Amber, MD;  [facility];  Service: Gynecology;  Laterality: Bilateral;   TOTAL MASTECTOMY Bilateral 11/07/2020   Procedure: BILATERAL MASTECTOMY;  Surgeon: [name], MD;  Location: [place];  Service: General;  Laterality: Bilateral;   Patient Active Problem List   Diagnosis Date Noted   Brain tumor ([place]) 11/08/2021   Metastatic cancer to brain ([place]) 11/08/2021   Cerebral edema ([place]) 11/08/2021   Complex partial seizure ([place]) 11/08/2021   Brain mass 11/07/2021   Right sided weakness  11/07/2021   IDA (iron deficiency anemia) 07/18/2021   Cholelithiasis 03/14/2021   Peripheral neuropathy due to chemotherapy ([place]) 10/20/2020   Morbid obesity with BMI of 40.0-44.9, adult ([place]) 07/19/2020   BRCA1 gene mutation positive 07/04/2020   Genetic testing 06/28/2020   Family history of non-Hodgkin's lymphoma 06/01/2020   Malignant neoplasm of upper-outer quadrant of left breast in female, estrogen receptor positive ([place]) 05/26/2020    ONSET DATE: 12/05/2021 (MD referral)  REFERRING DIAG: D49.6 (ICD-10-CM) - Neoplasm of unspecified behavior of brain  THERAPY DIAG:  Unsteadiness on feet  Other lack of coordination  Other abnormalities of gait and mobility  Muscle weakness (generalized)  Rationale for Evaluation and Treatment Rehabilitation  SUBJECTIVE:                                                                                                                                                                                              SUBJECTIVE STATEMENT: Doing okay, I am glad my ankle is working better, c/o some right buttock pain with her being up and walking more  PERTINENT HISTORY: metastatic breast cancer, brain tumor s/p excision with craniotomy; GERD, morbid obesity.  Admitted to rehab ([facility]) 11/17/21 and d/c home 12/09/2021  PAIN:  Are you having pain? No  PRECAUTIONS: Fall and Other: wearing R GRAFO, no driving  FALLS: Has patient fallen in last 6 months? 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 Now s/p L frontal crani tumor excision 5/19.  COGNITION: Overall cognitive status: Within functional limits for tasks assessed   SENSATION:  Just starting to get sensation back on bottom of R foot Light touch: WFL  COORDINATION: Decreased coordination RLE-needs assist of LLE or UEs to place RLE  EDEMA:  Noted in RLE-pt reports   MUSCLE TONE: RLE: Hypotonic   POSTURE: rounded shoulders  LOWER EXTREMITY ROM:     Passive  Right Eval Left Eval  Hip flexion    Hip extension    Hip abduction    Hip adduction    Hip internal rotation    Hip external rotation    Knee flexion    Knee extension    Ankle dorsiflexion -10 from neutral   Ankle plantarflexion    Ankle inversion    Ankle eversion     (Blank rows = not tested)  LOWER EXTREMITY MMT:    MMT Right Eval Left Eval  Hip flexion 3-/5 4/5  Hip extension    Hip abduction 3+/5 4/5  Hip adduction 3+/5 4/5  Hip internal rotation    Hip external rotation    Knee flexion 3-/5 4+/5  Knee extension 3-/5 4+/5  Ankle dorsiflexion trace 5/5  Ankle plantarflexion    Ankle inversion    Ankle eversion    (Blank rows = not tested)   TRANSFERS: Assistive device utilized: Environmental consultant - 2 wheeled  Sit to stand: SBA Stand to sit: SBA  GAIT: Gait pattern: step through pattern, decreased step length- Left, decreased stance time- Right, decreased ankle dorsiflexion- Right, and genu recurvatum- Right Distance walked: 60 ft  Assistive device utilized: Walker - 2 wheeled and R AFO Level of assistance: SBA Comments: Pt is able to don R AFO  FUNCTIONAL TESTs:  5 times sit to stand: 29.13 Timed up and go (TUG): 42.62 Gait velocity:  23.87 sec = 1.37 ft/sec 5x sit<>stand performed with UE support  PATIENT SURVEYS:  FOTO N/T  TREATMENT 01/31/22 Calf stretches 4x20 Walk with SPC SBA 3x100 feet NuStep level 5 x 6 minutes Right  leg HS curls 10# 3x6 Right only leg extension 5#  3x10 Leg press 20# both legs x 10, then right only 2x10 some guarding of the right knee Standing volley all with beach ball, ball kicks, going slow due to issues with right knee Gait with SPC outside uneven terrain, curb, CGA x 200 feet  01/25/22 Nustep L5x68mns Ankle rockers DF/PF Ankle redTB x10 DF/PF  Knee ext 15# 2x10 up w/BLE but down on RLE, 10# RLE  x10 Knee flexion 25# 2x10, 10# RLE x10    01/23/22 Nustep L5 x65ms STS with OHP yellow 2x5 Step ups on Airex in RW 2x5 each side Resisted gait 20# forwards and side steps x3 Standing hip abd/ext 5# 2x10     01/12/22 Nustep L5 x6m75m  Ankle ROM on disc Walking w/o AFO Side step ups 2HHA SL cone taps with RLE STS with OHP yellow ball x10  Resisted gait 20# forwards and side steps x3  01/09/22  Nustep L3 x6mi39mWalking without AD  Fitter 2x10  Step ups 6"  Knee ext 10# bilat R eccentric, 5# RLE only x10 HS curls 5# x10 RLE only   01/05/22 Bike L3 x6min43meassess BERG  Walking w/o AD in // bars w/AFO (forwards and side steps) Standing ankle ROM on rocker w/o AFO x10 DF/PF and IV/EV Heel taps on 6" 20reps Gastroc stretch seated w/strap 30s each  Stair training    01/03/22 Nustep L3 x5mins77might shifts in // bars 2 way hip 2# 2x10 Standing marches in // bars 2# 2x10  LAQ 2# 2x10 RLE  STS elevated mat 2x10    Patient's gait assessed without AFO, agree she doe snot need to use it when indoors,  but recommend using it when out of the house for safety due to fatigue. BERG assessed-41/56 Practiced forward weight shifts over RLE without hyperextension at parallel bars, difficutly Wbing with R knee control.  PATIENT EDUCATION: Education details: PT POC, Eval results, initial HEP Person educated: Patient Education method: Explanation, Demonstration, and Handouts Education comprehension: verbalized understanding and returned demonstration   HOME EXERCISE  PROGRAM: Access Code: [email or web address] URL: [email or web address] Date: 12/13/2021 Prepared by: [facility]  Exercises - Seated Heel Slide  - 1-2 x daily - 7 x weekly - 1 sets - 5-10 reps - 10-15 sec hold - Sit to Stand with Counter Support  - 1-2 x daily - 7 x weekly - 1-2 sets - 5 reps - Seated Gluteal Sets  - 1-2 x daily - 7 x weekly - 1-2 sets - 10 reps - 3 sec hold    GOALS: Goals reviewed with patient? Yes  SHORT TERM GOALS: Target date: 01/10/2022  Pt will be independent with HEP for improved strength, balance, gait. Baseline:  Initial HEP from CIR Goal status: met  2.  Pt will improve 5x sit<>stand to less than or equal to 20 sec to demonstrate improved functional strength and transfer efficiency. Baseline: 29.13 sec with BUE support (scores >15 sec indicate fall risk) Goal status: partially met  3.  Berg score to be assessed with goal to be written as appropriate. Baseline: Pt at increased fall risk per 5x sit<>stand and TUG Goal status: INITIAL   LONG TERM GOALS: Target date: 02/07/2022  Pt will be independent with HEP for improved strength, balance, transfers, and gait. Baseline:  Initial HEP from CIR Goal status: INITIAL  2.  Pt will improve 5x sit<>stand to less than or equal to 15 sec to demonstrate improved functional strength and transfer efficiency. Baseline: 29.13 sec with BUE support (scores >15 sec indicate fall risk) Goal status: INITIAL  3.  Pt will improve TUG score to less than or equal to 25 sec for decreased fall risk. Baseline: 42.62 sec (indicates increased fall risk and difficulty with ADLs in home) Goal status: INITIAL  4.  Pt will improve gait velocity to at least 1.8 ft/sec for improved gait efficiency and safety. Baseline: 1.37 ft/sec (1.31-2.62 indicates increased limited community ambulator) Goal status: INITIAL  5.  FOTO score to be assessed and score to improve to predicted level at d/c,  indicating improved overall functional mobility. Baseline: not assessed due to time constraints at eval Goal status: INITIAL  ASSESSMENT:  CLINICAL IMPRESSION: Patient is improving, I tried to do a little more overload principle type exercises with the right only today and then did the outside gait with the [facility] some higher level balance as well, she does have some instability with the right knee.  Patient has high PLOF and goals, she wants to go to [place] in October and will have to walk 30 minutes  OBJECTIVE IMPAIRMENTS Abnormal gait, decreased balance, decreased mobility, difficulty walking, decreased ROM, decreased strength, impaired flexibility, and impaired tone.   ACTIVITY LIMITATIONS standing, transfers, locomotion level, and caring for others  PARTICIPATION LIMITATIONS: meal prep, driving, shopping, community activity, and occupation  PERSONAL FACTORS 3+ comorbidities: see above for PMH  are also affecting patient's functional outcome.   REHAB POTENTIAL: Good  CLINICAL DECISION MAKING: Evolving/moderate complexity  EVALUATION COMPLEXITY: Moderate  PLAN: PT FREQUENCY: 2x/week  PT DURATION: 8 weeks plus eval  PLANNED INTERVENTIONS: Therapeutic exercises, Therapeutic activity, Neuromuscular re-education, Balance training, Gait  training, Patient/Family education, Joint mobilization, Orthotic/Fit training, DME instructions, Aquatic Therapy, and Manual therapy  PLAN FOR NEXT SESSION: quad/hamstring strengthening, gait training w/o AFO and AD, balance on airex    Lum Babe, PT 01/31/2022, 11:13 AM    [facility]

## 2022-02-05 ENCOUNTER — Ambulatory Visit: Payer: Medicaid Other

## 2022-02-05 VITALS — Wt 299.5 lb

## 2022-02-05 DIAGNOSIS — Z483 Aftercare following surgery for neoplasm: Secondary | ICD-10-CM
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
## 2022-02-07 ENCOUNTER — Ambulatory Visit: Payer: Medicaid Other

## 2022-02-07 DIAGNOSIS — C7931 Secondary malignant neoplasm of brain: Secondary | ICD-10-CM

## 2022-02-07 DIAGNOSIS — M6281 Muscle weakness (generalized): Secondary | ICD-10-CM

## 2022-02-07 DIAGNOSIS — R278 Other lack of coordination: Secondary | ICD-10-CM

## 2022-02-07 DIAGNOSIS — R2681 Unsteadiness on feet: Secondary | ICD-10-CM

## 2022-02-07 DIAGNOSIS — Z483 Aftercare following surgery for neoplasm: Secondary | ICD-10-CM

## 2022-02-07 DIAGNOSIS — R2689 Other abnormalities of gait and mobility: Secondary | ICD-10-CM

## 2022-02-07 DIAGNOSIS — R293 Abnormal posture: Secondary | ICD-10-CM

## 2022-02-07 NOTE — Therapy (Signed)
OUTPATIENT PHYSICAL THERAPY NEURO TREATMENT   Patient Name: Sheryl Mcmillan MRN: [id number] DOB:[birth year], 46 y.o., female Today's Date: 02/08/2022   PCP: Windell Hummingbird, PA-C REFERRING PROVIDER: Marlowe Shores, PA-C (to f/u with Izora Ribas, MD)   PT End of Session - 02/08/22 1232     Visit Number 11    PT Start Time 1749    PT Stop Time 4496    PT Time Calculation (min) 45 min    Equipment Utilized During Treatment Gait belt    Activity Tolerance Patient tolerated treatment well    Behavior During Therapy [facility] for tasks assessed/performed            Progress Note Reporting Period 12/13/21 to 02/07/22  See note below for Objective Data and Assessment of Progress/Goals.            Past Medical History:  Diagnosis Date   Arthritis    Breast cancer ([place])    Family history of non-Hodgkin's lymphoma 06/01/2020   GERD (gastroesophageal reflux disease)    Hemorrhoids    Migraines    PCOS (polycystic ovarian syndrome)    PONV (postoperative nausea and vomiting)    Past Surgical History:  Procedure Laterality Date   APPLICATION OF CRANIAL NAVIGATION Left 11/10/2021   Procedure: APPLICATION OF CRANIAL NAVIGATION;  Surgeon: Consuella Lose, MD;  Location: [place];  Service: Neurosurgery;  Laterality: Left;   AXILLARY SENTINEL NODE BIOPSY Left 11/07/2020   Procedure: LEFT AXILLARY SENTINEL NODE BIOPSY;  Surgeon: [name], MD;  Location: [place];  Service: General;  Laterality: Left;   BREAST CYST EXCISION Right 09/22/2021   Procedure: Excision of right axillary excess soft tissue.;  Surgeon: [name], MD;  Location: [place];  Service: Plastics;  Laterality: Right;   BREAST RECONSTRUCTION WITH PLACEMENT OF TISSUE EXPANDER AND FLEX HD (ACELLULAR HYDRATED DERMIS) Bilateral 11/07/2020   Procedure: BILATERAL BREAST RECONSTRUCTION WITH PLACEMENT OF TISSUE EXPANDER AND FLEX HD (ACELLULAR HYDRATED DERMIS);  Surgeon: [name], MD;  Location:  [place];  Service: Plastics;  Laterality: Bilateral;   COSMETIC SURGERY     CRANIOTOMY Left 11/10/2021   Procedure: LT FRONTAL CRANIOTOMY TUMOR EXCISION;  Surgeon: Consuella Lose, MD;  Location: [place];  Service: Neurosurgery;  Laterality: Left;   DILATION AND CURETTAGE OF UTERUS     HEMORRHOID SURGERY     IR IMAGING GUIDED PORT INSERTION  06/15/2020   LIPOSUCTION WITH LIPOFILLING Bilateral 09/22/2021   Procedure: Fat grafting bilateral breast;  Surgeon: [name], MD;  Location: [place];  Service: Plastics;  Laterality: Bilateral;   MASS EXCISION Bilateral 04/25/2021   Procedure: excision of bilateral axillary breast tissue;  Surgeon: [name], MD;  Location: [place];  Service: Plastics;  Laterality: Bilateral;   PORT-A-CATH REMOVAL Right 11/07/2020   Procedure: REMOVAL PORT-A-CATH;  Surgeon: [name], MD;  Location: [place];  Service: General;  Laterality: Right;   REMOVAL OF BILATERAL TISSUE EXPANDERS WITH PLACEMENT OF BILATERAL BREAST IMPLANTS Bilateral 04/25/2021   Procedure: REMOVAL OF BILATERAL TISSUE EXPANDERS WITH PLACEMENT OF BILATERAL BREAST IMPLANTS;  Surgeon: [name], MD;  Location: [place];  Service: Plastics;  Laterality: Bilateral;   ROBOTIC ASSISTED TOTAL HYSTERECTOMY WITH BILATERAL SALPINGO OOPHERECTOMY Bilateral 03/14/2021   Procedure: XI ROBOTIC ASSISTED TOTAL HYSTERECTOMY WITH BILATERAL SALPINGO OOPHORECTOMY;  Surgeon: Everitt Amber, MD;  Location: [facility];  Service: Gynecology;  Laterality: Bilateral;   TOTAL MASTECTOMY Bilateral 11/07/2020   Procedure:  BILATERAL MASTECTOMY;  Surgeon: [name], MD;  Location: [place];  Service: General;  Laterality: Bilateral;   Patient Active Problem List   Diagnosis Date Noted   Brain tumor ([place]) 11/08/2021   Metastatic cancer to brain ([place]) 11/08/2021   Cerebral edema ([place]) 11/08/2021   Complex partial seizure ([place]) 11/08/2021   Brain mass 11/07/2021    Right sided weakness 11/07/2021   IDA (iron deficiency anemia) 07/18/2021   Cholelithiasis 03/14/2021   Peripheral neuropathy due to chemotherapy ([place]) 10/20/2020   Morbid obesity with BMI of 40.0-44.9, adult ([place]) 07/19/2020   BRCA1 gene mutation positive 07/04/2020   Genetic testing 06/28/2020   Family history of non-Hodgkin's lymphoma 06/01/2020   Malignant neoplasm of upper-outer quadrant of left breast in female, estrogen receptor positive ([place]) 05/26/2020    ONSET DATE: 12/05/2021 (MD referral)  REFERRING DIAG: D49.6 (ICD-10-CM) - Neoplasm of unspecified behavior of brain  THERAPY DIAG:  Aftercare following surgery for neoplasm  Unsteadiness on feet  Other lack of coordination  Other abnormalities of gait and mobility  Muscle weakness (generalized)  Abnormal posture  Metastatic cancer to brain [facility])  Rationale for Evaluation and Treatment Rehabilitation  SUBJECTIVE:                                                                                                                                                                                              SUBJECTIVE STATEMENT: Doing okay, I am glad my ankle is working better, c/o some right buttock pain with her being up and walking more  PERTINENT HISTORY: metastatic breast cancer, brain tumor s/p excision with craniotomy; GERD, morbid obesity.  Admitted to rehab ([facility]) 11/17/21 and d/c home 12/09/2021  PAIN:  Are you having pain? No  PRECAUTIONS: Fall and Other: wearing R GRAFO, no driving  FALLS: Has patient fallen in last 6 months? 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 Now s/p L frontal crani tumor excision 5/19.  COGNITION: Overall cognitive status: Within functional limits for tasks assessed   SENSATION:  Just starting to get sensation back on bottom of R  foot Light touch: WFL  COORDINATION: Decreased coordination RLE-needs assist of LLE or UEs to place RLE   EDEMA:  Noted in RLE-pt reports   MUSCLE TONE: RLE: Hypotonic   POSTURE: rounded shoulders  LOWER EXTREMITY ROM:     Passive  Right Eval Right 8/16  Hip flexion    Hip extension    Hip abduction    Hip adduction    Hip internal rotation    Hip external rotation    Knee flexion    Knee extension    Ankle dorsiflexion -10 from neutral neutral  Ankle plantarflexion    Ankle inversion    Ankle eversion     (Blank rows = not tested)  LOWER EXTREMITY MMT:    MMT Right Eval Left Eval Right 02/07/22 Left  02/07/22  Hip flexion 3-/5 4/5 3+ 4  Hip extension      Hip abduction 3+/5 4/5 4 4   Hip adduction 3+/5 4/5 4 4   Hip internal rotation      Hip external rotation      Knee flexion 3-/5 4+/5 3+ 5  Knee extension 3-/5 4+/5 3+ 5  Ankle dorsiflexion trace 5/5 3+ 5  Ankle plantarflexion      Ankle inversion      Ankle eversion      (Blank rows = not tested)   TRANSFERS: Assistive device utilized: Environmental consultant - 2 wheeled  Sit to stand: SBA Stand to sit: SBA  GAIT: Gait pattern: step through pattern, decreased step length- Left, decreased stance time- Right, decreased ankle dorsiflexion- Right, and genu recurvatum- Right Distance walked: 60 ft  Assistive device utilized: Walker - 2 wheeled and R AFO Level of assistance: SBA Comments: Pt is able to don R AFO  FUNCTIONAL TESTs:  5 times sit to stand: 29.13 Timed up and go (TUG): 42.62 Gait velocity:   23.87 sec = 1.37 ft/sec 5x sit<>stand performed with UE support  PATIENT SURVEYS:  FOTO N/T  TREATMENT 02/08/22 Nustep L5 x77mns  Treadmill 2.5%, 0.846m 61m38m  HS curls 25# 2x10, 10# RLE x10 Leg ext 15# 2x10, 10# RLE x10 Leg press 20# 2x10, 10# RLE x10  Calf raises on leg press 2x15  02/07/22 Nustep L5 [id number] day (assessed goals and re-did tests)  5xSTS- 18.71s TUG- 18.12s Side steps over obstacles  Step up on 6" using SPC STS w/OHP on airex x10  01/31/22 Calf stretches 4x20 Walk with SPC SBA 3x100 feet NuStep level 5 x 6 minutes Right leg HS curls 10# 3x6 Right only leg extension 5#  3x10 Leg press 20# both legs x 10, then right only 2x10 some guarding of the right knee Standing volley all with beach ball, ball kicks, going slow due to issues with right knee Gait with SPC outside uneven terrain, curb, CGA x 200 feet  01/25/22 Nustep L5x6mi87mAnkle rockers DF/PF Ankle redTB x10 DF/PF  Knee ext 15# 2x10 up w/BLE but down on RLE, 10# RLE  x10 Knee flexion 25# 2x10, 10# RLE x10    01/23/22 Nustep L5 x6min58mTS with OHP yellow 2x5 Step ups on Airex in RW 2x5 each side Resisted gait 20# forwards and side steps x3 Standing hip abd/ext 5# 2x10     01/12/22 Nustep L5 x6mins77mnkle ROM on disc Walking w/o AFO Side step ups 2HHA SL cone taps with RLE STS with OHP yellow ball x10  Resisted gait 20# forwards and side steps x3  01/09/22  Nustep L3 x6mins 44mking without AD  Fitter 2x10  Step ups 6"  Knee ext 10# bilat R eccentric, 5# RLE only x10 HS curls 5# x10 RLE only   01/05/22 Bike L3 x79mns Reassess BERG  Walking w/o AD in // bars w/AFO (forwards and side steps) Standing ankle ROM on rocker w/o AFO x10 DF/PF and IV/EV Heel taps on 6" 20reps Gastroc stretch seated w/strap 30s each  Stair training    01/03/22 Nustep L3 x513ms Weight shifts in // bars 2 way hip 2# 2x10 Standing marches in // bars 2# 2x10  LAQ 2# 2x10 RLE  STS elevated mat  2x10    Patient's gait assessed without AFO, agree she doe snot need to use it when indoors, but recommend using it when out of the house for safety due to fatigue. BERG assessed-41/56 Practiced forward weight shifts over RLE without hyperextension at parallel bars, difficutly Wbing with R knee control.  PATIENT EDUCATION: Education details: PT POC, Eval results, initial HEP Person educated: Patient Education method: Explanation, Demonstration, and Handouts Education comprehension: verbalized understanding and returned demonstration   HOME EXERCISE PROGRAM: Access Code: [email or web address]: [email or web address] Date: 12/13/2021 Prepared by: [facility]  Exercises - Seated Heel Slide  - 1-2 x daily - 7 x weekly - 1 sets - 5-10 reps - 10-15 sec hold - Sit to Stand with Counter Support  - 1-2 x daily - 7 x weekly - 1-2 sets - 5 reps - Seated Gluteal Sets  - 1-2 x daily - 7 x weekly - 1-2 sets - 10 reps - 3 sec hold    GOALS: Goals reviewed with patient? Yes  SHORT TERM GOALS: Target date: 01/10/2022  Pt will be independent with HEP for improved strength, balance, gait. Baseline:  Initial HEP from CIR Goal status: met  2.  Pt will improve 5x sit<>stand to less than or equal to 20 sec to demonstrate improved functional strength and transfer efficiency. Baseline: 29.13 sec with BUE support (scores >15 sec indicate fall risk), 18.71s no UE support Goal status: met  3.  Patient will score at least 50 or more on BERG to demonstrate improved balance and decreased risk for falls.. Baseline: 41, 44/56-8/16 Goal status: IN PROGRESS   LONG TERM GOALS: Target date: 02/07/2022  Pt will be independent with HEP for improved strength, balance, transfers, and gait. Baseline:  Initial HEP from CIR Goal status: ongoing  2.  Pt will improve 5x sit<>stand to less than or equal to 15 sec to demonstrate improved functional strength and transfer  efficiency. Baseline: 29.13 sec with BUE support (scores >15 sec indicate fall risk), 18.71s- 8/16 Goal status: IN PROGRESS  3.  Pt will improve TUG score to less than or equal to 25 sec for decreased fall risk. Baseline: 42.62 sec (indicates increased fall risk and difficulty with ADLs in home), 18.12s-8/16 Goal status: MET  4.  Pt will improve gait velocity to at least 1.8 ft/sec for improved gait efficiency and safety. Baseline: 1.37 ft/sec (1.31-2.62 indicates increased limited community ambulator), 1.01 ft/sec Goal status: MET   ASSESSMENT:  CLINICAL IMPRESSION: Continued with gait training and strengthening today. She did well walking on the treadmill. Most difficulty with SL leg press, requires some help with last few reps. Showing good progress overall, continue with strengthening, gait, and balance.   OBJECTIVE IMPAIRMENTS Abnormal gait, decreased balance, decreased mobility, difficulty walking, decreased ROM, decreased strength, impaired flexibility, and impaired tone.   ACTIVITY LIMITATIONS standing, transfers, locomotion level, and  caring for others  PARTICIPATION LIMITATIONS: meal prep, driving, shopping, community activity, and occupation  PERSONAL FACTORS 3+ comorbidities: see above for PMH  are also affecting patient's functional outcome.   REHAB POTENTIAL: Good  CLINICAL DECISION MAKING: Evolving/moderate complexity  EVALUATION COMPLEXITY: Moderate  PLAN: PT FREQUENCY: 2x/week  PT DURATION: 8 weeks plus eval  PLANNED INTERVENTIONS: Therapeutic exercises, Therapeutic activity, Neuromuscular re-education, Balance training, Gait training, Patient/Family education, Joint mobilization, Orthotic/Fit training, DME instructions, Aquatic Therapy, and Manual therapy  PLAN FOR NEXT SESSION: quad/hamstring strengthening, gait training w/o AFO and AD, balance on airex    Andris Baumann, PT 02/08/2022, 1:14 PM    [place] [facility]

## 2022-02-07 NOTE — Therapy (Addendum)
OUTPATIENT PHYSICAL THERAPY NEURO TREATMENT   Patient Name: Sheryl Mcmillan MRN: [id number] DOB:Nov 23, 1975, 46 y.o., female Today's Date: 02/07/2022   PCP: Windell Hummingbird, PA-C REFERRING PROVIDER: Marlowe Shores, PA-C (to f/u with Izora Ribas, MD)   Progress Note Reporting Period 12/13/21 to 02/07/22  See note below for Objective Data and Assessment of Progress/Goals.            Past Medical History:  Diagnosis Date   Arthritis    Breast cancer ([place])    Family history of non-Hodgkin's lymphoma 06/01/2020   GERD (gastroesophageal reflux disease)    Hemorrhoids    Migraines    PCOS (polycystic ovarian syndrome)    PONV (postoperative nausea and vomiting)    Past Surgical History:  Procedure Laterality Date   APPLICATION OF CRANIAL NAVIGATION Left 11/10/2021   Procedure: APPLICATION OF CRANIAL NAVIGATION;  Surgeon: Consuella Lose, MD;  Location: [facility];  Service: Neurosurgery;  Laterality: Left;   AXILLARY SENTINEL NODE BIOPSY Left 11/07/2020   Procedure: LEFT AXILLARY SENTINEL NODE BIOPSY;  Surgeon: [name], MD;  Location: [facility];  Service: General;  Laterality: Left;   BREAST CYST EXCISION Right 09/22/2021   Procedure: Excision of right axillary excess soft tissue.;  Surgeon: [name], MD;  Location: [place];  Service: Plastics;  Laterality: Right;   BREAST RECONSTRUCTION WITH PLACEMENT OF TISSUE EXPANDER AND FLEX HD (ACELLULAR HYDRATED DERMIS) Bilateral 11/07/2020   Procedure: BILATERAL BREAST RECONSTRUCTION WITH PLACEMENT OF TISSUE EXPANDER AND FLEX HD (ACELLULAR HYDRATED DERMIS);  Surgeon: [name], MD;  Location: [facility];  Service: Plastics;  Laterality: Bilateral;   COSMETIC SURGERY     CRANIOTOMY Left 11/10/2021   Procedure: LT FRONTAL CRANIOTOMY TUMOR EXCISION;  Surgeon: Consuella Lose, MD;  Location: [facility];  Service: Neurosurgery;  Laterality: Left;   DILATION AND CURETTAGE OF UTERUS     HEMORRHOID SURGERY     IR  IMAGING GUIDED PORT INSERTION  06/15/2020   LIPOSUCTION WITH LIPOFILLING Bilateral 09/22/2021   Procedure: Fat grafting bilateral breast;  Surgeon: [name], MD;  Location: [facility];  Service: Plastics;  Laterality: Bilateral;   MASS EXCISION Bilateral 04/25/2021   Procedure: excision of bilateral axillary breast tissue;  Surgeon: [name], MD;  Location: [facility];  Service: Plastics;  Laterality: Bilateral;   PORT-A-CATH REMOVAL Right 11/07/2020   Procedure: REMOVAL PORT-A-CATH;  Surgeon: [name], MD;  Location: [facility];  Service: General;  Laterality: Right;   REMOVAL OF BILATERAL TISSUE EXPANDERS WITH PLACEMENT OF BILATERAL BREAST IMPLANTS Bilateral 04/25/2021   Procedure: REMOVAL OF BILATERAL TISSUE EXPANDERS WITH PLACEMENT OF BILATERAL BREAST IMPLANTS;  Surgeon: [name], MD;  Location: [facility];  Service: Plastics;  Laterality: Bilateral;   ROBOTIC ASSISTED TOTAL HYSTERECTOMY WITH BILATERAL SALPINGO OOPHERECTOMY Bilateral 03/14/2021   Procedure: XI ROBOTIC ASSISTED TOTAL HYSTERECTOMY WITH BILATERAL SALPINGO OOPHORECTOMY;  Surgeon: Everitt Amber, MD;  Location: [facility];  Service: Gynecology;  Laterality: Bilateral;   TOTAL MASTECTOMY Bilateral 11/07/2020   Procedure: BILATERAL MASTECTOMY;  Surgeon: [name], MD;  Location: [facility];  Service: General;  Laterality: Bilateral;   Patient Active Problem List   Diagnosis Date Noted   Brain tumor ([place]) 11/08/2021   Metastatic cancer to brain ([place]) 11/08/2021   Cerebral edema ([place]) 11/08/2021   Complex partial seizure ([place]) 11/08/2021   Brain mass 11/07/2021   Right sided weakness 11/07/2021   IDA (iron deficiency anemia) 07/18/2021   Cholelithiasis  03/14/2021   Peripheral neuropathy due to chemotherapy ([place]) 10/20/2020   Morbid obesity with BMI of 40.0-44.9, adult ([place]) 07/19/2020   BRCA1 gene mutation positive 07/04/2020   Genetic testing 06/28/2020   Family  history of non-Hodgkin's lymphoma 06/01/2020   Malignant neoplasm of upper-outer quadrant of left breast in female, estrogen receptor positive ([place]) 05/26/2020    ONSET DATE: 12/05/2021 (MD referral)  REFERRING DIAG: D49.6 (ICD-10-CM) - Neoplasm of unspecified behavior of brain  THERAPY DIAG:  No diagnosis found.  Rationale for Evaluation and Treatment Rehabilitation  SUBJECTIVE:                                                                                                                                                                                              SUBJECTIVE STATEMENT: Doing okay, I am glad my ankle is working better, c/o some right buttock pain with her being up and walking more  PERTINENT HISTORY: metastatic breast cancer, brain tumor s/p excision with craniotomy; GERD, morbid obesity.  Admitted to rehab ([facility]) 11/17/21 and d/c home 12/09/2021  PAIN:  Are you having pain? No  PRECAUTIONS: Fall and Other: wearing R GRAFO, no driving  FALLS: Has patient fallen in last 6 months? 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 Now s/p L frontal crani tumor excision 5/19.  COGNITION: Overall cognitive status: Within functional limits for tasks assessed   SENSATION:  Just starting to get sensation back on bottom of R foot Light touch: WFL  COORDINATION: Decreased coordination RLE-needs assist of LLE or UEs to place RLE    EDEMA:  Noted in RLE-pt reports   MUSCLE TONE: RLE: Hypotonic   POSTURE: rounded shoulders  LOWER EXTREMITY ROM:     Passive  Right Eval Right 8/16  Hip flexion    Hip extension    Hip abduction    Hip adduction    Hip internal rotation    Hip external rotation    Knee flexion    Knee extension    Ankle dorsiflexion -10 from neutral neutral  Ankle plantarflexion    Ankle inversion  Ankle eversion     (Blank rows = not tested)  LOWER EXTREMITY MMT:    MMT Right Eval Left Eval Right 02/07/22 Left  02/07/22  Hip flexion 3-/5 4/5 3+ 4  Hip extension      Hip abduction 3+/5 4/5 4 4   Hip adduction 3+/5 4/5 4 4   Hip internal rotation      Hip external rotation      Knee flexion 3-/5 4+/5 3+ 5  Knee extension 3-/5 4+/5 3+ 5  Ankle dorsiflexion trace 5/5 3+ 5  Ankle plantarflexion      Ankle inversion      Ankle eversion      (Blank rows = not tested)   TRANSFERS: Assistive device utilized: Environmental consultant - 2 wheeled  Sit to stand: SBA Stand to sit: SBA  GAIT: Gait pattern: step through pattern, decreased step length- Left, decreased stance time- Right, decreased ankle dorsiflexion- Right, and genu recurvatum- Right Distance walked: 60 ft  Assistive device utilized: Walker - 2 wheeled and R AFO Level of assistance: SBA Comments: Pt is able to don R AFO  FUNCTIONAL TESTs:  5 times sit to stand: 29.13 Timed up and go (TUG): 42.62 Gait velocity:  23.87 sec = 1.37 ft/sec 5x sit<>stand performed with UE support  PATIENT SURVEYS:  FOTO N/T  TREATMENT 02/07/22 Nustep L5 [id number] Recert day (assessed goals and re-did tests)  5xSTS- 18.71s TUG- 18.12s Side steps over obstacles  Step up on 6" using SPC STS w/OHP on airex x10  01/31/22 Calf stretches 4x20 Walk with SPC SBA 3x100 feet NuStep level 5 x 6 minutes Right leg HS curls 10# 3x6 Right only leg extension 5#  3x10 Leg press 20# both legs x 10, then right only 2x10 some guarding of the right  knee Standing volley all with beach ball, ball kicks, going slow due to issues with right knee Gait with SPC outside uneven terrain, curb, CGA x 200 feet  01/25/22 Nustep L5x66mns Ankle rockers DF/PF Ankle redTB x10 DF/PF  Knee ext 15# 2x10 up w/BLE but down on RLE, 10# RLE  x10 Knee flexion 25# 2x10, 10# RLE x10    01/23/22 Nustep L5 x662ms STS with OHP yellow 2x5 Step ups on Airex in RW 2x5 each side Resisted gait 20# forwards and side steps x3 Standing hip abd/ext 5# 2x10     01/12/22 Nustep L5 x6m34m  Ankle ROM on disc Walking w/o AFO Side step ups 2HHA SL cone taps with RLE STS with OHP yellow ball x10  Resisted gait 20# forwards and side steps x3  01/09/22  Nustep L3 x6mi86mWalking without AD  Fitter 2x10  Step ups 6"  Knee ext 10# bilat R eccentric, 5# RLE only x10 HS curls 5# x10 RLE only   01/05/22 Bike L3 x6min59meassess BERG  Walking w/o AD in // bars w/AFO (forwards and side steps) Standing ankle ROM on rocker w/o AFO x10 DF/PF and IV/EV Heel taps on 6" 20reps Gastroc stretch seated w/strap 30s each  Stair training    01/03/22 Nustep L3 x5mins84might shifts in // bars 2 way hip 2# 2x10 Standing marches in // bars 2# 2x10  LAQ 2# 2x10 RLE  STS elevated mat 2x10    Patient's gait assessed without AFO, agree she doe snot need to use it when indoors, but recommend using it when out of the house for safety due to fatigue. BERG assessed-41/56 Practiced forward weight shifts over RLE without hyperextension at parallel bars, difficutly [place]  with R knee control.  PATIENT EDUCATION: Education details: PT POC, Eval results, initial HEP Person educated: Patient Education method: Explanation, Demonstration, and Handouts Education comprehension: verbalized understanding and returned demonstration   HOME EXERCISE PROGRAM: Access Code: [email or web address] URL: [email or web address] Date: 12/13/2021 Prepared by: [facility]  Exercises - Seated Heel Slide  - 1-2 x daily - 7 x weekly - 1 sets - 5-10 reps - 10-15 sec hold - Sit to Stand with Counter Support  - 1-2 x daily - 7 x weekly - 1-2 sets - 5 reps - Seated Gluteal Sets  - 1-2 x daily - 7 x weekly - 1-2 sets - 10 reps - 3 sec hold    GOALS: Goals reviewed with patient? Yes  SHORT TERM GOALS: Target date: 01/10/2022  Pt will be independent with HEP for improved strength, balance, gait. Baseline:  Initial HEP from CIR Goal status: met  2.  Pt will improve 5x sit<>stand to less than or equal to 20 sec to demonstrate improved functional strength and transfer efficiency. Baseline: 29.13 sec with BUE support (scores >15 sec indicate fall risk), 18.71s no UE support Goal status: met  3.  Patient will score at least 50 or more on BERG to demonstrate improved balance and decreased risk for falls.. Baseline: 41, 44/56-8/16 Goal status: IN PROGRESS   LONG TERM GOALS: Target date: 02/07/2022  Pt will be independent with HEP for improved strength, balance, transfers, and gait. Baseline:  Initial HEP from CIR Goal status: ongoing  2.  Pt will improve 5x sit<>stand to less than or equal to 15 sec to demonstrate improved functional strength and transfer efficiency. Baseline: 29.13 sec with BUE support (scores >15 sec indicate fall risk), 18.71s- 8/16 Goal status: IN PROGRESS  3.  Pt will improve TUG score to less than or equal to 25 sec for decreased fall risk. Baseline: 42.62 sec (indicates increased fall risk and difficulty with ADLs in home), 18.12s-8/16 Goal status: MET  4.  Pt will improve gait velocity to at least 1.8 ft/sec for improved gait efficiency and safety. Baseline: 1.37 ft/sec (1.31-2.62 indicates increased limited community ambulator), 1.01 ft/sec Goal status: MET   ASSESSMENT:  CLINICAL IMPRESSION: Reauthorization visit today, reassessed patient goals and re-scored functional tests. Patient is progressing towards her goals and  with the exception of BERG and 5xSTS. She still presents with abnormal gait and will benefit from ongoing PT to help meet her personal goals of walking and driving to be able to travel in the upcoming months.    OBJECTIVE IMPAIRMENTS Abnormal gait, decreased balance, decreased mobility, difficulty walking, decreased ROM, decreased strength, impaired flexibility, and impaired tone.   ACTIVITY LIMITATIONS standing, transfers, locomotion level, and caring for others  PARTICIPATION LIMITATIONS: meal prep, driving, shopping, community activity, and occupation  PERSONAL FACTORS 3+ comorbidities: see above for PMH  are also affecting patient's functional outcome.   REHAB POTENTIAL: Good  CLINICAL DECISION MAKING: Evolving/moderate complexity  EVALUATION COMPLEXITY: Moderate  PLAN: PT FREQUENCY: 2x/week  PT DURATION: 8 weeks plus eval  PLANNED INTERVENTIONS: Therapeutic exercises, Therapeutic activity, Neuromuscular re-education, Balance training, Gait training, Patient/Family education, Joint mobilization, Orthotic/Fit training, DME instructions, Aquatic Therapy, and Manual therapy  PLAN FOR NEXT SESSION: quad/hamstring strengthening, gait training w/o AFO and AD, balance on airex    Andris Baumann, PT 02/07/2022, 8:11 AM    [place] [facility]

## 2022-02-08 ENCOUNTER — Ambulatory Visit: Payer: Medicaid Other

## 2022-02-08 DIAGNOSIS — C7931 Secondary malignant neoplasm of brain: Secondary | ICD-10-CM

## 2022-02-08 DIAGNOSIS — R293 Abnormal posture: Secondary | ICD-10-CM

## 2022-02-08 DIAGNOSIS — Z483 Aftercare following surgery for neoplasm: Secondary | ICD-10-CM

## 2022-02-08 DIAGNOSIS — R2681 Unsteadiness on feet: Secondary | ICD-10-CM | POA: Diagnosis not present

## 2022-02-08 DIAGNOSIS — M6281 Muscle weakness (generalized): Secondary | ICD-10-CM

## 2022-02-08 DIAGNOSIS — R278 Other lack of coordination: Secondary | ICD-10-CM

## 2022-02-08 DIAGNOSIS — R2689 Other abnormalities of gait and mobility: Secondary | ICD-10-CM

## 2022-02-09 ENCOUNTER — Ambulatory Visit (HOSPITAL_COMMUNITY)
Admission: RE | Admit: 2022-02-09 | Discharge: 2022-02-09 | Disposition: A | Discharge status: Home / Self Care | Payer: Medicaid Other | Source: Ambulatory Visit | Attending: Radiation Oncology | Admitting: Radiation Oncology

## 2022-02-09 ENCOUNTER — Encounter (HOSPITAL_COMMUNITY): Payer: Self-pay

## 2022-02-09 DIAGNOSIS — C7931 Secondary malignant neoplasm of brain: Secondary | ICD-10-CM

## 2022-02-12 ENCOUNTER — Other Ambulatory Visit: Payer: Self-pay | Admitting: Radiation Oncology

## 2022-02-12 ENCOUNTER — Inpatient Hospital Stay: Payer: Medicaid Other

## 2022-02-12 ENCOUNTER — Ambulatory Visit: Payer: Medicaid Other | Admitting: Radiation Oncology

## 2022-02-12 DIAGNOSIS — C50412 Malignant neoplasm of upper-outer quadrant of left female breast: Secondary | ICD-10-CM

## 2022-02-12 DIAGNOSIS — C7931 Secondary malignant neoplasm of brain: Secondary | ICD-10-CM

## 2022-02-12 MED ORDER — LORAZEPAM 0.5 MG PO TABS: ORAL_TABLET | ORAL | 0 refills | Status: DC

## 2022-02-13 ENCOUNTER — Ambulatory Visit: Payer: Medicaid Other

## 2022-02-13 ENCOUNTER — Telehealth: Payer: Self-pay | Admitting: Radiation Therapy

## 2022-02-13 DIAGNOSIS — M6281 Muscle weakness (generalized): Secondary | ICD-10-CM

## 2022-02-13 DIAGNOSIS — R278 Other lack of coordination: Secondary | ICD-10-CM

## 2022-02-13 DIAGNOSIS — R293 Abnormal posture: Secondary | ICD-10-CM

## 2022-02-13 DIAGNOSIS — R2681 Unsteadiness on feet: Secondary | ICD-10-CM

## 2022-02-13 DIAGNOSIS — Z483 Aftercare following surgery for neoplasm: Secondary | ICD-10-CM

## 2022-02-13 DIAGNOSIS — R2689 Other abnormalities of gait and mobility: Secondary | ICD-10-CM

## 2022-02-13 NOTE — Therapy (Signed)
OUTPATIENT PHYSICAL THERAPY NEURO TREATMENT   Patient Name: Sheryl Mcmillan MRN: [id number] DOB:[birth year], 46 y.o., female Today's Date: 02/13/2022   PCP: Windell Hummingbird, PA-C REFERRING PROVIDER: Marlowe Shores, PA-C (to f/u with Izora Ribas, MD)   PT End of Session - 02/13/22 1232     Visit Number 12    PT Start Time 1230    PT Stop Time 1315    PT Time Calculation (min) 45 min    Equipment Utilized During Treatment Gait belt    Activity Tolerance Patient tolerated treatment well    Behavior During Therapy [facility] for tasks assessed/performed                 Past Medical History:  Diagnosis Date   Arthritis    Breast cancer ([place])    Family history of non-Hodgkin's lymphoma 06/01/2020   GERD (gastroesophageal reflux disease)    Hemorrhoids    Migraines    PCOS (polycystic ovarian syndrome)    PONV (postoperative nausea and vomiting)    Past Surgical History:  Procedure Laterality Date   APPLICATION OF CRANIAL NAVIGATION Left 11/10/2021   Procedure: APPLICATION OF CRANIAL NAVIGATION;  Surgeon: Consuella Lose, MD;  Location: [place];  Service: Neurosurgery;  Laterality: Left;   AXILLARY SENTINEL NODE BIOPSY Left 11/07/2020   Procedure: LEFT AXILLARY SENTINEL NODE BIOPSY;  Surgeon: [name], MD;  Location: [place];  Service: General;  Laterality: Left;   BREAST CYST EXCISION Right 09/22/2021   Procedure: Excision of right axillary excess soft tissue.;  Surgeon: [name], MD;  Location: [place];  Service: Plastics;  Laterality: Right;   BREAST RECONSTRUCTION WITH PLACEMENT OF TISSUE EXPANDER AND FLEX HD (ACELLULAR HYDRATED DERMIS) Bilateral 11/07/2020   Procedure: BILATERAL BREAST RECONSTRUCTION WITH PLACEMENT OF TISSUE EXPANDER AND FLEX HD (ACELLULAR HYDRATED DERMIS);  Surgeon: [name], MD;  Location: [place];  Service: Plastics;  Laterality: Bilateral;   COSMETIC SURGERY     CRANIOTOMY Left 11/10/2021   Procedure: LT FRONTAL  CRANIOTOMY TUMOR EXCISION;  Surgeon: Consuella Lose, MD;  Location: [place];  Service: Neurosurgery;  Laterality: Left;   DILATION AND CURETTAGE OF UTERUS     HEMORRHOID SURGERY     IR IMAGING GUIDED PORT INSERTION  06/15/2020   LIPOSUCTION WITH LIPOFILLING Bilateral 09/22/2021   Procedure: Fat grafting bilateral breast;  Surgeon: [name], MD;  Location: [place];  Service: Plastics;  Laterality: Bilateral;   MASS EXCISION Bilateral 04/25/2021   Procedure: excision of bilateral axillary breast tissue;  Surgeon: [name], MD;  Location: [place];  Service: Plastics;  Laterality: Bilateral;   PORT-A-CATH REMOVAL Right 11/07/2020   Procedure: REMOVAL PORT-A-CATH;  Surgeon: [name], MD;  Location: [place];  Service: General;  Laterality: Right;   REMOVAL OF BILATERAL TISSUE EXPANDERS WITH PLACEMENT OF BILATERAL BREAST IMPLANTS Bilateral 04/25/2021   Procedure: REMOVAL OF BILATERAL TISSUE EXPANDERS WITH PLACEMENT OF BILATERAL BREAST IMPLANTS;  Surgeon: [name], MD;  Location: [place];  Service: Plastics;  Laterality: Bilateral;   ROBOTIC ASSISTED TOTAL HYSTERECTOMY WITH BILATERAL SALPINGO OOPHERECTOMY Bilateral 03/14/2021   Procedure: XI ROBOTIC ASSISTED TOTAL HYSTERECTOMY WITH BILATERAL SALPINGO OOPHORECTOMY;  Surgeon: Everitt Amber, MD;  Location: [facility];  Service: Gynecology;  Laterality: Bilateral;   TOTAL MASTECTOMY Bilateral 11/07/2020   Procedure: BILATERAL MASTECTOMY;  Surgeon: [name], MD;  Location: [place];  Service: General;  Laterality: Bilateral;   Patient Active Problem List  Diagnosis Date Noted   Brain tumor ([place]) 11/08/2021   Metastatic cancer to brain ([place]) 11/08/2021   Cerebral edema ([place]) 11/08/2021   Complex partial seizure ([place]) 11/08/2021   Brain mass 11/07/2021   Right sided weakness 11/07/2021   IDA (iron deficiency anemia) 07/18/2021   Cholelithiasis 03/14/2021   Peripheral  neuropathy due to chemotherapy ([place]) 10/20/2020   Morbid obesity with BMI of 40.0-44.9, adult ([place]) 07/19/2020   BRCA1 gene mutation positive 07/04/2020   Genetic testing 06/28/2020   Family history of non-Hodgkin's lymphoma 06/01/2020   Malignant neoplasm of upper-outer quadrant of left breast in female, estrogen receptor positive ([place]) 05/26/2020    ONSET DATE: 12/05/2021 (MD referral)  REFERRING DIAG: D49.6 (ICD-10-CM) - Neoplasm of unspecified behavior of brain  THERAPY DIAG:  Aftercare following surgery for neoplasm  Unsteadiness on feet  Other lack of coordination  Other abnormalities of gait and mobility  Muscle weakness (generalized)  Abnormal posture  Rationale for Evaluation and Treatment Rehabilitation  SUBJECTIVE:                                                                                                                                                                                              SUBJECTIVE STATEMENT: Doing okay, I am glad my ankle is working better, c/o some right buttock pain with her being up and walking more  PERTINENT HISTORY: metastatic breast cancer, brain tumor s/p excision with craniotomy; GERD, morbid obesity.  Admitted to rehab ([facility]) 11/17/21 and d/c home 12/09/2021  PAIN:  Are you having pain? No  PRECAUTIONS: Fall and Other: wearing R GRAFO, no driving  FALLS: Has patient fallen in last 6 months? 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 Now s/p L frontal crani tumor excision 5/19.  COGNITION: Overall cognitive status: Within functional limits for tasks assessed   SENSATION:  Just starting to get sensation back on bottom of R foot Light touch: WFL  COORDINATION: Decreased coordination RLE-needs assist of LLE or UEs to place RLE   EDEMA:  Noted in RLE-pt reports   MUSCLE TONE: RLE: Hypotonic  POSTURE: rounded shoulders  LOWER EXTREMITY ROM:     Passive  Right Eval Right 8/16  Hip flexion    Hip extension    Hip abduction    Hip adduction    Hip internal rotation    Hip external rotation    Knee flexion    Knee extension    Ankle dorsiflexion -10 from neutral neutral  Ankle plantarflexion    Ankle inversion    Ankle eversion     (Blank rows = not tested)  LOWER EXTREMITY MMT:    MMT Right Eval Left Eval Right 02/07/22 Left  02/07/22  Hip flexion 3-/5 4/5 3+ 4  Hip extension      Hip abduction 3+/5 4/5 4 4   Hip adduction 3+/5 4/5 4 4   Hip internal rotation      Hip external rotation      Knee flexion 3-/5 4+/5 3+ 5  Knee extension 3-/5 4+/5 3+ 5  Ankle dorsiflexion trace 5/5 3+ 5  Ankle plantarflexion      Ankle inversion      Ankle eversion      (Blank rows = not tested)   TRANSFERS: Assistive device utilized: Environmental consultant - 2 wheeled  Sit to stand: SBA Stand to sit: SBA  GAIT: Gait pattern: step through pattern, decreased step length- Left, decreased stance time- Right, decreased ankle dorsiflexion- Right, and genu recurvatum- Right Distance walked: 60 ft  Assistive device utilized: Walker - 2 wheeled and R AFO Level of assistance: SBA Comments: Pt is able to don R AFO  FUNCTIONAL TESTs:  5 times sit to stand: 29.13 Timed up and go (TUG): 42.62 Gait velocity:  23.87 sec = 1.37 ft/sec 5x sit<>stand performed with UE support  PATIENT SURVEYS:  FOTO N/T  TREATMENT 02/13/22 Nustep L5 x43mns Walking [facility]  Navigating curbs with SPC Side step ups 6" 2Cygnetball kicks 2x5 each side Walking on beam in // bars     02/08/22 Nustep L5 x652ms  Treadmill 2.5%, 0.34m57m5mi62m HS curls 25# 2x10, 10# RLE x10 Leg ext 15# 2x10, 10# RLE x10 Leg press 20# 2x10, 10# RLE x10  Calf raises on leg press 2x15  02/07/22 Nustep L5 x6miV2ZDGUert day (assessed goals and re-did tests)  5xSTS- 18.71s TUG- 18.12s Side steps over obstacles  Step up on 6" using SPC STS w/OHP on airex x10  01/31/22 Calf stretches 4x20 Walk with SPC SBA 3x100 feet NuStep level 5 x 6 minutes Right leg HS curls 10# 3x6 Right only leg extension 5#  3x10 Leg press 20# both legs x 10, then right only 2x10 some guarding of the right knee Standing volley all with beach ball, ball kicks, going slow due to issues with right knee Gait with SPC outside uneven terrain, curb, CGA x 200 feet  01/25/22 Nustep L5x6min49mnkle rockers DF/PF Ankle redTB x10 DF/PF  Knee ext 15# 2x10 up w/BLE but down on RLE, 10# RLE  x10 Knee flexion 25# 2x10, 10# RLE x10   01/23/22 Nustep L5 x6mins85mS with OHP yellow 2x5 Step ups on Airex in RW 2x5 each side Resisted gait 20# forwards and side steps x3 Standing hip abd/ext 5# 2x10     01/12/22 Nustep L5 x6mins 84mkle ROM on disc Walking w/o AFO Side step ups 2HHA SL cone taps with RLE STS with OHP yellow ball x10  Resisted gait 20# forwards and side steps x3  01/09/22  Nustep L3 x6mins W42ming without  AD  Fitter 2x10  Step ups 6"  Knee ext 10# bilat R eccentric, 5# RLE only x10 HS curls 5# x10 RLE only   01/05/22 Bike L3 x39mns Reassess BERG  Walking w/o AD in // bars w/AFO (forwards and side steps) Standing ankle ROM on rocker w/o AFO x10 DF/PF and IV/EV Heel taps on 6" 20reps Gastroc stretch seated w/strap 30s each  Stair training    01/03/22 Nustep L3 x541ms Weight shifts in // bars 2 way hip 2# 2x10 Standing marches in // bars 2# 2x10  LAQ 2# 2x10 RLE  STS elevated  mat 2x10    Patient's gait assessed without AFO, agree she doe snot need to use it when indoors, but recommend using it when out of the house for safety due to fatigue. BERG assessed-41/56 Practiced forward weight shifts over RLE without hyperextension at parallel bars, difficutly Wbing with R knee control.  PATIENT EDUCATION: Education details: PT POC, Eval results, initial HEP Person educated: Patient Education method: Explanation, Demonstration, and Handouts Education comprehension: verbalized understanding and returned demonstration   HOME EXERCISE PROGRAM: Access Code: [email or web address]: [email or web address] Date: 12/13/2021 [facility]  Exercises - Seated Heel Slide  - 1-2 x daily - 7 x weekly - 1 sets - 5-10 reps - 10-15 sec hold - Sit to Stand with Counter Support  - 1-2 x daily - 7 x weekly - 1-2 sets - 5 reps - Seated Gluteal Sets  - 1-2 x daily - 7 x weekly - 1-2 sets - 10 reps - 3 sec hold    GOALS: Goals reviewed with patient? Yes  SHORT TERM GOALS: Target date: 01/10/2022  Pt will be independent with HEP for improved strength, balance, gait. Baseline:  Initial HEP from CIR Goal status: met  2.  Pt will improve 5x sit<>stand to less than or equal to 20 sec to demonstrate improved functional strength and transfer efficiency. Baseline: 29.13 sec with BUE support (scores >15 sec indicate fall risk), 18.71s no UE support Goal status: met  3.  Patient will score at least 50 or more on BERG to demonstrate improved balance and decreased risk for falls.. Baseline: 41, 44/56-8/16 Goal status: IN PROGRESS   LONG TERM GOALS: Target date: 02/07/2022  Pt will be independent with HEP for improved strength, balance, transfers, and gait. Baseline:  Initial HEP from CIR Goal status: ongoing  2.  Pt will improve 5x sit<>stand to less than or equal to 15 sec to demonstrate improved functional strength and transfer  efficiency. Baseline: 29.13 sec with BUE support (scores >15 sec indicate fall risk), 18.71s- 8/16 Goal status: IN PROGRESS  3.  Pt will improve TUG score to less than or equal to 25 sec for decreased fall risk. Baseline: 42.62 sec (indicates increased fall risk and difficulty with ADLs in home), 18.12s-8/16 Goal status: MET  4.  Pt will improve gait velocity to at least 1.8 ft/sec for improved gait efficiency and safety. Baseline: 1.37 ft/sec (1.31-2.62 indicates increased limited community ambulator), 1.01 ft/sec Goal status: MET   ASSESSMENT:  CLINICAL IMPRESSION: Continued with gait training, functional tasks and balance today. She should good progress walking outdoor on uneven terrain with SPC, no stumbles and is steady. Able to take side steps on beam with little UE support needed. Showing good progress overall, continue with strengthening, gait, and balance.   OBJECTIVE IMPAIRMENTS Abnormal gait, decreased balance, decreased mobility, difficulty walking, decreased ROM, decreased strength, impaired flexibility,  and impaired tone.   ACTIVITY LIMITATIONS standing, transfers, locomotion level, and caring for others  PARTICIPATION LIMITATIONS: meal prep, driving, shopping, community activity, and occupation  PERSONAL FACTORS 3+ comorbidities: see above for PMH  are also affecting patient's functional outcome.   REHAB POTENTIAL: Good  CLINICAL DECISION MAKING: Evolving/moderate complexity  EVALUATION COMPLEXITY: Moderate  PLAN: PT FREQUENCY: 2x/week  PT DURATION: 8 weeks plus eval  PLANNED INTERVENTIONS: Therapeutic exercises, Therapeutic activity, Neuromuscular re-education, Balance training, Gait training, Patient/Family education, Joint mobilization, Orthotic/Fit training, DME instructions, Aquatic Therapy, and Manual therapy  PLAN FOR NEXT SESSION: quad/hamstring strengthening, gait training w/o AFO and AD, balance on airex    Andris Baumann, PT 02/13/2022, 1:15  PM    [facility]

## 2022-02-13 NOTE — Telephone Encounter (Signed)
Called Sheryl Mcmillan to let her know that the medication for her claustrophobia has been sent in by [facility] and her MRI has been rescheduled. She is aware of the new MRI appointment time and to take her medication 30 min before the scan. I also reminded her that Bryson Ha will call her with those results on Monday 9/11, she does not have to come in person to that visit.   Mont Dutton R.T.(R)(T) Radiation Special Procedures Navigator

## 2022-02-14 NOTE — Therapy (Signed)
OUTPATIENT PHYSICAL THERAPY NEURO TREATMENT   Patient Name: Sheryl Mcmillan MRN: [id number] DOB:1976-05-09, 46 y.o., female Today's Date: 02/15/2022   PCP: Windell Hummingbird, PA-C REFERRING PROVIDER: Marlowe Shores, PA-C (to f/u with Izora Ribas, MD)   PT End of Session - 02/15/22 1230     Visit Number 13    PT Start Time 10    PT Stop Time 1312    PT Time Calculation (min) 42 min    Equipment Utilized During Treatment --    Activity Tolerance Patient tolerated treatment well    Behavior During Therapy [facility] for tasks assessed/performed                  Past Medical History:  Diagnosis Date   Arthritis    Breast cancer ([place])    Family history of non-Hodgkin's lymphoma 06/01/2020   GERD (gastroesophageal reflux disease)    Hemorrhoids    Migraines    PCOS (polycystic ovarian syndrome)    PONV (postoperative nausea and vomiting)    Past Surgical History:  Procedure Laterality Date   APPLICATION OF CRANIAL NAVIGATION Left 11/10/2021   Procedure: APPLICATION OF CRANIAL NAVIGATION;  Surgeon: Consuella Lose, MD;  Location: [place];  Service: Neurosurgery;  Laterality: Left;   AXILLARY SENTINEL NODE BIOPSY Left 11/07/2020   Procedure: LEFT AXILLARY SENTINEL NODE BIOPSY;  Surgeon: [name], MD;  Location: [place];  Service: General;  Laterality: Left;   BREAST CYST EXCISION Right 09/22/2021   Procedure: Excision of right axillary excess soft tissue.;  Surgeon: [name], MD;  Location: [place];  Service: Plastics;  Laterality: Right;   BREAST RECONSTRUCTION WITH PLACEMENT OF TISSUE EXPANDER AND FLEX HD (ACELLULAR HYDRATED DERMIS) Bilateral 11/07/2020   Procedure: BILATERAL BREAST RECONSTRUCTION WITH PLACEMENT OF TISSUE EXPANDER AND FLEX HD (ACELLULAR HYDRATED DERMIS);  Surgeon: [name], MD;  Location: [place];  Service: Plastics;  Laterality: Bilateral;   COSMETIC SURGERY     CRANIOTOMY Left 11/10/2021   Procedure: LT FRONTAL CRANIOTOMY  TUMOR EXCISION;  Surgeon: Consuella Lose, MD;  Location: [place];  Service: Neurosurgery;  Laterality: Left;   DILATION AND CURETTAGE OF UTERUS     HEMORRHOID SURGERY     IR IMAGING GUIDED PORT INSERTION  06/15/2020   LIPOSUCTION WITH LIPOFILLING Bilateral 09/22/2021   Procedure: Fat grafting bilateral breast;  Surgeon: [name], MD;  Location: [place];  Service: Plastics;  Laterality: Bilateral;   MASS EXCISION Bilateral 04/25/2021   Procedure: excision of bilateral axillary breast tissue;  Surgeon: [name], MD;  Location: [place];  Service: Plastics;  Laterality: Bilateral;   PORT-A-CATH REMOVAL Right 11/07/2020   Procedure: REMOVAL PORT-A-CATH;  Surgeon: [name], MD;  Location: [place];  Service: General;  Laterality: Right;   REMOVAL OF BILATERAL TISSUE EXPANDERS WITH PLACEMENT OF BILATERAL BREAST IMPLANTS Bilateral 04/25/2021   Procedure: REMOVAL OF BILATERAL TISSUE EXPANDERS WITH PLACEMENT OF BILATERAL BREAST IMPLANTS;  Surgeon: [name], MD;  Location: [place];  Service: Plastics;  Laterality: Bilateral;   ROBOTIC ASSISTED TOTAL HYSTERECTOMY WITH BILATERAL SALPINGO OOPHERECTOMY Bilateral 03/14/2021   Procedure: XI ROBOTIC ASSISTED TOTAL HYSTERECTOMY WITH BILATERAL SALPINGO OOPHORECTOMY;  Surgeon: Everitt Amber, MD;  Location: [facility];  Service: Gynecology;  Laterality: Bilateral;   TOTAL MASTECTOMY Bilateral 11/07/2020   Procedure: BILATERAL MASTECTOMY;  Surgeon: [name], MD;  Location: [place];  Service: General;  Laterality: Bilateral;   Patient Active Problem List  Diagnosis Date Noted   Brain tumor ([place]) 11/08/2021   Metastatic cancer to brain ([place]) 11/08/2021   Cerebral edema ([place]) 11/08/2021   Complex partial seizure ([place]) 11/08/2021   Brain mass 11/07/2021   Right sided weakness 11/07/2021   IDA (iron deficiency anemia) 07/18/2021   Cholelithiasis 03/14/2021   Peripheral neuropathy due to  chemotherapy ([place]) 10/20/2020   Morbid obesity with BMI of 40.0-44.9, adult ([place]) 07/19/2020   BRCA1 gene mutation positive 07/04/2020   Genetic testing 06/28/2020   Family history of non-Hodgkin's lymphoma 06/01/2020   Malignant neoplasm of upper-outer quadrant of left breast in female, estrogen receptor positive ([place]) 05/26/2020    ONSET DATE: 12/05/2021 (MD referral)  REFERRING DIAG: D49.6 (ICD-10-CM) - Neoplasm of unspecified behavior of brain  THERAPY DIAG:  Aftercare following surgery for neoplasm  Unsteadiness on feet  Other lack of coordination  Other abnormalities of gait and mobility  Muscle weakness (generalized)  Abnormal posture  Metastatic cancer to brain [facility])  Rationale for Evaluation and Treatment Rehabilitation  SUBJECTIVE:                                                                                                                                                                                              SUBJECTIVE STATEMENT: I am doing good, not using walker in the house mostly just when I leave. I am tired today but don't know why.   PERTINENT HISTORY: metastatic breast cancer, brain tumor s/p excision with craniotomy; GERD, morbid obesity.  Admitted to rehab ([facility]) 11/17/21 and d/c home 12/09/2021  PAIN:  Are you having pain? No  PRECAUTIONS: Fall and Other: wearing R GRAFO, no driving  FALLS: Has patient fallen in last 6 months? 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 Now s/p L frontal crani tumor excision 5/19.  COGNITION: Overall cognitive status: Within functional limits for tasks assessed   SENSATION:  Just starting to get sensation back on bottom of R foot Light touch: WFL  COORDINATION: Decreased coordination RLE-needs assist of LLE or UEs to place RLE   EDEMA:  Noted in  RLE-pt reports   MUSCLE TONE: RLE: Hypotonic   POSTURE: rounded shoulders  LOWER EXTREMITY ROM:     Passive  Right Eval Right 8/16  Hip flexion    Hip extension    Hip abduction    Hip adduction    Hip internal rotation    Hip external rotation    Knee flexion    Knee extension    Ankle dorsiflexion -10 from neutral neutral  Ankle plantarflexion    Ankle inversion    Ankle eversion     (Blank rows = not tested)  LOWER EXTREMITY MMT:    MMT Right Eval Left Eval Right 02/07/22 Left  02/07/22  Hip flexion 3-/5 4/5 3+ 4  Hip extension      Hip abduction 3+/5 4/5 4 4   Hip adduction 3+/5 4/5 4 4   Hip internal rotation      Hip external rotation      Knee flexion 3-/5 4+/5 3+ 5  Knee extension 3-/5 4+/5 3+ 5  Ankle dorsiflexion trace 5/5 3+ 5  Ankle plantarflexion      Ankle inversion      Ankle eversion      (Blank rows = not tested)   TRANSFERS: Assistive device utilized: Environmental consultant - 2 wheeled  Sit to stand: SBA Stand to sit: SBA  GAIT: Gait pattern: step through pattern, decreased step length- Left, decreased stance time- Right, decreased ankle dorsiflexion- Right, and genu recurvatum- Right Distance walked: 60 ft  Assistive device utilized: Walker - 2 wheeled and R AFO Level of assistance: SBA Comments: Pt is able to don R AFO  FUNCTIONAL TESTs:  5 times sit to stand: 29.13 Timed up and go (TUG): 42.62 Gait velocity:  23.87 sec = 1.37 ft/sec 5x sit<>stand performed with UE support  PATIENT SURVEYS:  FOTO N/T  TREATMENT 02/15/22 Nustep L5 x69mns  Step overs  2" both sides Standing on airex hitting ball  Marching on airex 1HHA  Leg press 20# 2x10, RLE eccentric x10, RLE 20# x10 Calf raises on leg press 20# 2x12    02/13/22 Nustep L5 x65ms Walking outside with [facility]  Navigating curbs with SPC Side step ups 6" 2HLemoore Stationall kicks 2x5 each side Walking on beam in // bars     02/08/22 Nustep L5 x6m82m  Treadmill 2.5%, 0.8mp74mmin68mHS curls 25# 2x10, 10# RLE x10 Leg ext 15# 2x10, 10# RLE x10 Leg press 20# 2x10, 10# RLE x10  Calf raises on leg press 2x15  02/07/22 Nustep L5 x6minO1BPZWrt day (assessed goals and re-did tests)  5xSTS- 18.71s TUG- 18.12s Side steps over obstacles  Step up on 6" using SPC STS w/OHP on airex x10  01/31/22 Calf stretches 4x20 Walk with SPC SBA 3x100 feet NuStep level 5 x 6 minutes Right leg HS curls 10# 3x6 Right only leg extension 5#  3x10 Leg press 20# both legs x 10, then right only 2x10 some guarding of the right knee Standing volley all with beach ball, ball kicks, going slow due to issues with right knee Gait with SPC outside uneven terrain, curb, CGA x 200 feet  01/25/22 Nustep L5x6mins78mkle rockers DF/PF Ankle redTB x10 DF/PF  Knee ext 15# 2x10 up w/BLE but down on RLE, 10# RLE  x10 Knee flexion 25# 2x10, 10# RLE x10   01/23/22 Nustep L5 x6mins 18m with OHP yellow 2x5 Step ups on Airex in RW 2x5 each side Resisted gait 20# forwards and side steps x3 Standing hip  abd/ext 5# 2x10     01/12/22 Nustep L5 x54mns  Ankle ROM on disc Walking w/o AFO Side step ups 2HHA SL cone taps with RLE STS with OHP yellow ball x10  Resisted gait 20# forwards and side steps x3  01/09/22  Nustep L3 x635ms Walking without AD  Fitter 2x10  Step ups 6"  Knee ext 10# bilat R eccentric, 5# RLE only x10 HS curls 5# x10 RLE only   01/05/22 Bike L3 x6m17m Reassess BERG  Walking w/o AD in // bars w/AFO (forwards and side steps) Standing ankle ROM on rocker w/o AFO x10 DF/PF and IV/EV Heel taps  on 6" 20reps Gastroc stretch seated w/strap 30s each  Stair training    01/03/22 Nustep L3 x5mi17mWeight shifts in // bars 2 way hip 2# 2x10 Standing marches in // bars 2# 2x10  LAQ 2# 2x10 RLE  STS elevated mat 2x10    Patient's gait assessed without AFO, agree she doe snot need to use it when indoors, but recommend using it when out of the house for safety due to fatigue. BERG assessed-41/56 Practiced forward weight shifts over RLE without hyperextension at parallel bars, difficutly Wbing with R knee control.  PATIENT EDUCATION: Education details: PT POC, Eval results, initial HEP Person educated: Patient Education method: Explanation, Demonstration, and Handouts Education comprehension: verbalized understanding and returned demonstration   HOME EXERCISE PROGRAM: Access Code: [email or web address]: [email or web address] Date: 12/13/2021 Prepared by: [facility]  Exercises - Seated Heel Slide  - 1-2 x daily - 7 x weekly - 1 sets - 5-10 reps - 10-15 sec hold - Sit to Stand with Counter Support  - 1-2 x daily - 7 x weekly - 1-2 sets - 5 reps - Seated Gluteal Sets  - 1-2 x daily - 7 x weekly - 1-2 sets - 10 reps - 3 sec hold    GOALS: Goals reviewed with patient? Yes  SHORT TERM GOALS: Target date: 01/10/2022  Pt will be independent with HEP for improved strength, balance, gait. Baseline:  Initial HEP from CIR Goal status: met  2.  Pt will improve 5x sit<>stand to less than or equal to 20 sec to demonstrate improved functional strength and transfer efficiency. Baseline: 29.13 sec with BUE support (scores >15 sec indicate fall risk), 18.71s no UE support Goal status: met  3.  Patient will score at least 50 or more on BERG to demonstrate improved balance and decreased risk for falls.. Baseline: 41, 44/56-8/16 Goal status: IN PROGRESS   LONG TERM GOALS: Target date: 02/07/2022  Pt will be independent with HEP for improved  strength, balance, transfers, and gait. Baseline:  Initial HEP from CIR Goal status: ongoing  2.  Pt will improve 5x sit<>stand to less than or equal to 15 sec to demonstrate improved functional strength and transfer efficiency. Baseline: 29.13 sec with BUE support (scores >15 sec indicate fall risk), 18.71s- 8/16 Goal status: IN PROGRESS  3.  Pt will improve TUG score to less than or equal to 25 sec for decreased fall risk. Baseline: 42.62 sec (indicates increased fall risk and difficulty with ADLs in home), 18.12s-8/16 Goal status: MET  4.  Pt will improve gait velocity to at least 1.8 ft/sec for improved gait efficiency and safety. Baseline: 1.37 ft/sec (1.31-2.62 indicates increased limited community ambulator), 1.01 ft/sec Goal status: MET   ASSESSMENT:  CLINICAL IMPRESSION: Patient is slow in all movements today and is very tired during session. Patient  is hesitant with step overs, states she does not feel safe on 4". We lowered it to 2" and she is able to complete with 1HHA but has more difficulty when standing on RLE. She is a bit fearful standing on airex and got dizzy, had to take a break. More difficulty controlling placement of RLE today. Needed assistance with SL press, held handle so it was easier with no weight. Continue with balance and strength training to normalize gait pattern and help return to PLOF.   OBJECTIVE IMPAIRMENTS Abnormal gait, decreased balance, decreased mobility, difficulty walking, decreased ROM, decreased strength, impaired flexibility, and impaired tone.   ACTIVITY LIMITATIONS standing, transfers, locomotion level, and caring for others  PARTICIPATION LIMITATIONS: meal prep, driving, shopping, community activity, and occupation  PERSONAL FACTORS 3+ comorbidities: see above for PMH  are also affecting patient's functional outcome.   REHAB POTENTIAL: Good  CLINICAL DECISION MAKING: Evolving/moderate complexity  EVALUATION COMPLEXITY:  Moderate  PLAN: PT FREQUENCY: 2x/week  PT DURATION: 8 weeks plus eval  PLANNED INTERVENTIONS: Therapeutic exercises, Therapeutic activity, Neuromuscular re-education, Balance training, Gait training, Patient/Family education, Joint mobilization, Orthotic/Fit training, DME instructions, Aquatic Therapy, and Manual therapy  PLAN FOR NEXT SESSION: quad/hamstring strengthening, gait training w/o AFO and AD, balance on airex    Andris Baumann, PT 02/15/2022, 1:13 PM    [place] at [facility]

## 2022-02-15 ENCOUNTER — Ambulatory Visit: Payer: Medicaid Other

## 2022-02-15 DIAGNOSIS — C7931 Secondary malignant neoplasm of brain: Secondary | ICD-10-CM

## 2022-02-15 DIAGNOSIS — R2681 Unsteadiness on feet: Secondary | ICD-10-CM | POA: Diagnosis not present

## 2022-02-15 DIAGNOSIS — R278 Other lack of coordination: Secondary | ICD-10-CM

## 2022-02-15 DIAGNOSIS — M6281 Muscle weakness (generalized): Secondary | ICD-10-CM

## 2022-02-15 DIAGNOSIS — R2689 Other abnormalities of gait and mobility: Secondary | ICD-10-CM

## 2022-02-15 DIAGNOSIS — Z483 Aftercare following surgery for neoplasm: Secondary | ICD-10-CM

## 2022-02-15 DIAGNOSIS — R293 Abnormal posture: Secondary | ICD-10-CM

## 2022-02-16 NOTE — Progress Notes (Unsigned)
Subjective:    Sheryl Mcmillan - 46 y.o. female MRN [id number]  Date of birth: 07-19-1975  HPI  Sheryl Mcmillan is to establish care and emergency department follow-up.  Current issues and/or concerns:  Left AMA   On attempted discharge patient was tachycardic to the 120s.  EKG shows sinus rhythm.  She refused CBG check.  She requested IV to be removed and she left before she been reassessed.   Ezequiel Essex, MD 01/15/22 2347        01/15/2022 [facility] Emergency Department per MD note:                         Medical Decision Making Amount and/or Complexity of Data Reviewed Radiology: ordered.   Risk Prescription drug management.     Patient presents due to fall. Ddx includes not limited to fracture, dislocation, msk injury, cauda equina (less likely), c spine injury, intracranial hemorrhage, skull fracture.  There are no focal deficits on neuro exam, C spine normal ROM without midline tenderness. No signs of basilar skull fracture.  I ordered imaging of knee and CT imaging of hip and lumbar spine to evaluate for possible occult fracture. Initially was not going to proceed with CT head but given patient's history of craniotomy, metastatic brain cancer will obtain here in the ED.     I considered c spine but very low suspicion for any traumatic findings given she did not hit her head and does fact she does not have any focal deficits.  There is no midline tenderness, she is complete ROM.  Based on Nexus C-spine criteria patient is very low risk and imaging is not indicated.    I have ordered fentanyl for patient's pain.  I also reviewed patient's home medication list, she is not on any anticoagulation.  Plain film of the knee viewed and interpreted by myself.  Negative for any acute process, agree with radiologist interpretation.  Attestation:  CT imaging is pending at time of shift change.  Patient care signed out to my attending Dr. Wyvonnia Dusky who has evaluated  the patient.  Disposition pending results of imaging   Discussed HPI, physical exam and plan of care for this patient with attending Ezequiel Essex. The attending physician evaluated this patient as part of a shared visit and agrees with plan of care.    Attestation: I provided a substantive portion of the care of this patient.  I personally performed the entirety of the history, exam, and medical decision making for this encounter.   Patient with metastatic breast cancer status postcraniotomy, chronic right-sided weakness here with right hip pain and left knee pain after falling today.  States she was using her walker when her leg gave out from underneath her and she fell onto her right hip and also injuring her left knee.  Denies hitting head or losing consciousness.  No blood thinner use.   Abrasion left anterior knee.  Full range of motion of left knee and hip without pain.  Right lateral hip tenderness to palpation.  No shortening or external rotation.  Intact DP and PT pulses. Weakness of ankle flexion and extension, knee flexion and extension on the right is chronic and unchanged per patient.   Imaging is reassuring.  No evidence of fractures.  Patient able to ambulate with her walker with assistance.   Today's visit 02/19/2022:  ROS per HPI     Health Maintenance:  Health Maintenance Due  Topic  Date Due   COVID-19 Vaccine (1) Never done   Hepatitis C Screening  Never done   TETANUS/TDAP  Never done   INFLUENZA VACCINE  01/23/2022     Past Medical History: Patient Active Problem List   Diagnosis Date Noted   Brain tumor ([place]) 11/08/2021   Metastatic cancer to brain ([place]) 11/08/2021   Cerebral edema ([place]) 11/08/2021   Complex partial seizure ([place]) 11/08/2021   Brain mass 11/07/2021   Right sided weakness 11/07/2021   IDA (iron deficiency anemia) 07/18/2021   Cholelithiasis 03/14/2021   Peripheral neuropathy due to chemotherapy ([place]) 10/20/2020   Morbid obesity with BMI  of 40.0-44.9, adult ([place]) 07/19/2020   BRCA1 gene mutation positive 07/04/2020   Genetic testing 06/28/2020   Family history of non-Hodgkin's lymphoma 06/01/2020   Malignant neoplasm of upper-outer quadrant of left breast in female, estrogen receptor positive ([place]) 05/26/2020      Social History   reports that she quit smoking about 17 months ago. Her smoking use included cigarettes. She has a 5.00 pack-year smoking history. She has never used smokeless tobacco. She reports that she does not currently use alcohol. She reports current drug use. Drug: Marijuana.   Family History  family history includes Breast cancer in an other family member; Cancer (age of onset: 69) in her mother; Diabetes in her mother; Hypertension in her mother; Non-Hodgkin's lymphoma (age of onset: 44) in her brother; Other in her mother; Other (age of onset: 42) in her maternal uncle.   Medications: reviewed and updated   Objective:   Physical Exam LMP 06/08/2020  Physical Exam      Assessment & Plan:         Patient was given clear instructions to go to Emergency Department or return to medical center if symptoms don't improve, worsen, or new problems develop.The patient verbalized understanding.  I discussed the assessment and treatment plan with the patient. The patient was provided an opportunity to ask questions and all were answered. The patient agreed with the plan and demonstrated an understanding of the instructions.   The patient was advised to call back or seek an in-person evaluation if the symptoms worsen or if the condition fails to improve as anticipated.    Durene Fruits, NP 02/16/2022, 12:43 PM Primary Care at [facility]

## 2022-02-19 ENCOUNTER — Encounter: Payer: Self-pay | Admitting: Family

## 2022-02-19 ENCOUNTER — Ambulatory Visit (INDEPENDENT_AMBULATORY_CARE_PROVIDER_SITE_OTHER): Payer: Medicaid Other | Admitting: Family

## 2022-02-19 VITALS — BP 130/88 | HR 100 | Temp 98.3°F | Resp 16 | Ht 71.0 in | Wt 295.0 lb

## 2022-02-19 DIAGNOSIS — C7931 Secondary malignant neoplasm of brain: Secondary | ICD-10-CM | POA: Diagnosis not present

## 2022-02-19 DIAGNOSIS — R531 Weakness: Secondary | ICD-10-CM

## 2022-02-19 DIAGNOSIS — Z7689 Persons encountering health services in other specified circumstances: Secondary | ICD-10-CM

## 2022-02-19 DIAGNOSIS — C50412 Malignant neoplasm of upper-outer quadrant of left female breast: Secondary | ICD-10-CM

## 2022-02-19 DIAGNOSIS — D496 Neoplasm of unspecified behavior of brain: Secondary | ICD-10-CM

## 2022-02-19 DIAGNOSIS — Z170 Estrogen receptor positive status [ER+]: Secondary | ICD-10-CM

## 2022-02-19 NOTE — Patient Instructions (Signed)
Thank you for choosing Primary Care at [facility] for your medical home!    Sheryl Mcmillan was seen by Camillia Herter, NP today.   Sheryl Mcmillan's primary care provider is Camillia Herter, NP.   For the best care possible,  you should try to see Durene Fruits, NP whenever you come to office.   We look forward to seeing you again soon!  If you have any questions about your visit today,  please call us at [phone]  Or feel free to reach your provider via [place].    Keeping you healthy   Get these tests Blood pressure- Have your blood pressure checked once a year by your healthcare provider.  Normal blood pressure is 120/80. Weight- Have your body mass index (BMI) calculated to screen for obesity.  BMI is a measure of body fat based on height and weight. You can also calculate your own BMI at GravelBags.it. Cholesterol- Have your cholesterol checked regularly starting at age 80, sooner may be necessary if you have diabetes, high blood pressure, if a family member developed heart diseases at an early age or if you smoke.  Chlamydia, HIV, and other sexual transmitted disease- Get screened each year until the age of 71 then within three months of each new sexual partner. Diabetes- Have your blood sugar checked regularly if you have high blood pressure, high cholesterol, a family history of diabetes or if you are overweight.   Get these vaccines Flu shot- Every fall. Tetanus shot- Every 10 years. Menactra- Single dose; prevents meningitis.   Take these steps Don't smoke- If you do smoke, ask your healthcare provider about quitting. For tips on how to quit, go to www.smokefree.gov or call 1-800-QUIT-NOW. Be physically active- Exercise 5 days a week for at least 30 minutes.  If you are not already physically active start slow and gradually work up to 30 minutes of moderate physical activity.  Examples of moderate activity include walking briskly, mowing the yard, dancing,  swimming bicycling, etc. Eat a healthy diet- Eat a variety of healthy foods such as fruits, vegetables, low fat milk, low fat cheese, yogurt, lean meats, poultry, fish, beans, tofu, etc.  For more information on healthy eating, go to www.thenutritionsource.org Drink alcohol in moderation- Limit alcohol intake two drinks or less a day.  Never drink and drive. Dentist- Brush and floss teeth twice daily; visit your dentis twice a year. Depression-Your emotional health is as important as your physical health.  If you're feeling down, losing interest in things you normally enjoy please talk with your healthcare provider. Gun Safety- If you keep a gun in your home, keep it unloaded and with the safety lock on.  Bullets should be stored separately. Helmet use- Always wear a helmet when riding a motorcycle, bicycle, rollerblading or skateboarding. Safe sex- If you may be exposed to a sexually transmitted infection, use a condom Seat belts- Seat bels can save your life; always wear one. Smoke/Carbon Monoxide detectors- These detectors need to be installed on the appropriate level of your home.  Replace batteries at least once a year. Skin Cancer- When out in the sun, cover up and use sunscreen SPF 15 or higher. Violence- If anyone is threatening or hurting you, please tell your healthcare provider.

## 2022-02-19 NOTE — Progress Notes (Signed)
.  Pt presents to establish care,   

## 2022-02-20 ENCOUNTER — Ambulatory Visit: Payer: Medicaid Other

## 2022-02-20 DIAGNOSIS — R278 Other lack of coordination: Secondary | ICD-10-CM

## 2022-02-20 DIAGNOSIS — Z483 Aftercare following surgery for neoplasm: Secondary | ICD-10-CM

## 2022-02-20 DIAGNOSIS — M6281 Muscle weakness (generalized): Secondary | ICD-10-CM

## 2022-02-20 DIAGNOSIS — R2689 Other abnormalities of gait and mobility: Secondary | ICD-10-CM

## 2022-02-20 DIAGNOSIS — R2681 Unsteadiness on feet: Secondary | ICD-10-CM

## 2022-02-20 DIAGNOSIS — R293 Abnormal posture: Secondary | ICD-10-CM

## 2022-02-20 NOTE — Therapy (Signed)
OUTPATIENT PHYSICAL THERAPY NEURO TREATMENT   Patient Name: Sheryl Mcmillan MRN: [id number] DOB:February 17, 1976, 46 y.o., female Today's Date: 02/20/2022   PCP: Windell Hummingbird, PA-C REFERRING PROVIDER: Marlowe Shores, PA-C (to f/u with Izora Ribas, MD)   PT End of Session - 02/20/22 1228     Visit Number 14    PT Start Time 1228    PT Stop Time 1314    PT Time Calculation (min) 46 min    Equipment Utilized During Treatment Gait belt    Activity Tolerance Patient tolerated treatment well    Behavior During Therapy WFL for tasks assessed/performed                   Past Medical History:  Diagnosis Date   Arthritis    Breast cancer ([place])    Family history of non-Hodgkin's lymphoma 06/01/2020   GERD (gastroesophageal reflux disease)    Hemorrhoids    Migraines    PCOS (polycystic ovarian syndrome)    PONV (postoperative nausea and vomiting)    Past Surgical History:  Procedure Laterality Date   APPLICATION OF CRANIAL NAVIGATION Left 11/10/2021   Procedure: APPLICATION OF CRANIAL NAVIGATION;  Surgeon: Consuella Lose, MD;  Location: [place];  Service: Neurosurgery;  Laterality: Left;   AXILLARY SENTINEL NODE BIOPSY Left 11/07/2020   Procedure: LEFT AXILLARY SENTINEL NODE BIOPSY;  Surgeon: [name], MD;  Location: [place];  Service: General;  Laterality: Left;   BREAST CYST EXCISION Right 09/22/2021   Procedure: Excision of right axillary excess soft tissue.;  Surgeon: [name], MD;  Location: [place];  Service: Plastics;  Laterality: Right;   BREAST RECONSTRUCTION WITH PLACEMENT OF TISSUE EXPANDER AND FLEX HD (ACELLULAR HYDRATED DERMIS) Bilateral 11/07/2020   Procedure: BILATERAL BREAST RECONSTRUCTION WITH PLACEMENT OF TISSUE EXPANDER AND FLEX HD (ACELLULAR HYDRATED DERMIS);  Surgeon: [name], MD;  Location: [place];  Service: Plastics;  Laterality: Bilateral;   COSMETIC SURGERY     CRANIOTOMY Left 11/10/2021   Procedure: LT FRONTAL  CRANIOTOMY TUMOR EXCISION;  Surgeon: Consuella Lose, MD;  Location: [place];  Service: Neurosurgery;  Laterality: Left;   DILATION AND CURETTAGE OF UTERUS     HEMORRHOID SURGERY     IR IMAGING GUIDED PORT INSERTION  06/15/2020   LIPOSUCTION WITH LIPOFILLING Bilateral 09/22/2021   Procedure: Fat grafting bilateral breast;  Surgeon: [name], MD;  Location: [place];  Service: Plastics;  Laterality: Bilateral;   MASS EXCISION Bilateral 04/25/2021   Procedure: excision of bilateral axillary breast tissue;  Surgeon: [name], MD;  Location: [place];  Service: Plastics;  Laterality: Bilateral;   PORT-A-CATH REMOVAL Right 11/07/2020   Procedure: REMOVAL PORT-A-CATH;  Surgeon: [name], MD;  Location: [place];  Service: General;  Laterality: Right;   REMOVAL OF BILATERAL TISSUE EXPANDERS WITH PLACEMENT OF BILATERAL BREAST IMPLANTS Bilateral 04/25/2021   Procedure: REMOVAL OF BILATERAL TISSUE EXPANDERS WITH PLACEMENT OF BILATERAL BREAST IMPLANTS;  Surgeon: [name], MD;  Location: [place];  Service: Plastics;  Laterality: Bilateral;   ROBOTIC ASSISTED TOTAL HYSTERECTOMY WITH BILATERAL SALPINGO OOPHERECTOMY Bilateral 03/14/2021   Procedure: XI ROBOTIC ASSISTED TOTAL HYSTERECTOMY WITH BILATERAL SALPINGO OOPHORECTOMY;  Surgeon: Everitt Amber, MD;  Location: [facility];  Service: Gynecology;  Laterality: Bilateral;   TOTAL MASTECTOMY Bilateral 11/07/2020   Procedure: BILATERAL MASTECTOMY;  Surgeon: [name], MD;  Location: [place];  Service: General;  Laterality: Bilateral;   Patient Active Problem  List   Diagnosis Date Noted   Brain tumor ([place]) 11/08/2021   Metastatic cancer to brain ([place]) 11/08/2021   Cerebral edema ([place]) 11/08/2021   Complex partial seizure ([place]) 11/08/2021   Brain mass 11/07/2021   Right sided weakness 11/07/2021   IDA (iron deficiency anemia) 07/18/2021   Cholelithiasis 03/14/2021   Peripheral  neuropathy due to chemotherapy ([place]) 10/20/2020   Morbid obesity with BMI of 40.0-44.9, adult ([place]) 07/19/2020   BRCA1 gene mutation positive 07/04/2020   Genetic testing 06/28/2020   Family history of non-Hodgkin's lymphoma 06/01/2020   Malignant neoplasm of upper-outer quadrant of left breast in female, estrogen receptor positive ([place]) 05/26/2020    ONSET DATE: 12/05/2021 (MD referral)  REFERRING DIAG: D49.6 (ICD-10-CM) - Neoplasm of unspecified behavior of brain  THERAPY DIAG:  Aftercare following surgery for neoplasm  Unsteadiness on feet  Other lack of coordination  Other abnormalities of gait and mobility  Muscle weakness (generalized)  Abnormal posture  Rationale for Evaluation and Treatment Rehabilitation  SUBJECTIVE:                                                                                                                                                                                              SUBJECTIVE STATEMENT: I am doing good, not using walker in the house mostly just when I leave. I am tired today but don't know why.   PERTINENT HISTORY: metastatic breast cancer, brain tumor s/p excision with craniotomy; GERD, morbid obesity.  Admitted to rehab ([facility]) 11/17/21 and d/c home 12/09/2021  PAIN:  Are you having pain? No  PRECAUTIONS: Fall and Other: wearing R GRAFO, no driving  FALLS: Has patient fallen in last 6 months? 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 Now s/p L frontal crani tumor excision 5/19.  COGNITION: Overall cognitive status: Within functional limits for tasks assessed   SENSATION:  Just starting to get sensation back on bottom of R foot Light touch: WFL  COORDINATION: Decreased coordination RLE-needs assist of LLE or UEs to place RLE   EDEMA:  Noted in RLE-pt reports  MUSCLE TONE: RLE: Hypotonic   POSTURE: rounded shoulders  LOWER EXTREMITY ROM:     Passive  Right Eval Right 8/16  Hip flexion    Hip extension    Hip abduction    Hip adduction    Hip internal rotation    Hip external rotation    Knee flexion    Knee extension    Ankle dorsiflexion -10 from neutral neutral  Ankle plantarflexion    Ankle inversion    Ankle eversion     (Blank rows = not tested)  LOWER EXTREMITY MMT:    MMT Right Eval Left Eval Right 02/07/22 Left  02/07/22  Hip flexion 3-/5 4/5 3+ 4  Hip extension      Hip abduction 3+/5 4/5 4 4   Hip adduction 3+/5 4/5 4 4   Hip internal rotation      Hip external rotation      Knee flexion 3-/5 4+/5 3+ 5  Knee extension 3-/5 4+/5 3+ 5  Ankle dorsiflexion trace 5/5 3+ 5  Ankle plantarflexion      Ankle inversion      Ankle eversion      (Blank rows = not tested)   TRANSFERS: Assistive device utilized: Environmental consultant - 2 wheeled  Sit to stand: SBA Stand to sit: SBA  GAIT: Gait pattern: step through pattern, decreased step length- Left, decreased stance time- Right, decreased ankle dorsiflexion- Right, and genu recurvatum- Right Distance walked: 60 ft  Assistive device utilized: Walker - 2 wheeled and R AFO Level of assistance: SBA Comments: Pt is able to don R AFO  FUNCTIONAL TESTs:  5 times sit to stand: 29.13 Timed up and go (TUG): 42.62 Gait velocity:  23.87 sec = 1.37 ft/sec 5x sit<>stand performed with UE support  PATIENT SURVEYS:  FOTO N/T  TREATMENT 02/20/22 Walking outside w/cane small loop and on grass   Nustep L5 x17mns  Walking on black mat  with and w/o AD forwards and backwards Side steps on black mat  STS on black mat 4x5 Shoulder flexion 3# WaTE on folded mat x10  02/15/22 Nustep L5 x634ms  Step overs 2" both sides Standing on airex hitting ball  Marching on airex 1HHA  Leg press 20# 2x10, RLE eccentric x10, RLE 20# x10 Calf raises on leg press 20# 2x12    02/13/22 Nustep L5 x6m36m Walking outside [facility]  Navigating curbs with SPC Side step ups 6" 2HHEthridgell kicks 2x5 each side Walking on beam in // bars     02/08/22 Nustep L5 x6mi44m Treadmill 2.5%, 0.8mph4mins80mS curls 25# 2x10, 10# RLE x10 Leg ext 15# 2x10, 10# RLE x10 Leg press 20# 2x10, 10# RLE x10  Calf raises on leg press 2x15  02/07/22 Nustep L5 x6minsM1YJWLt day (assessed goals and re-did tests)  5xSTS- 18.71s TUG- 18.12s Side steps over obstacles  Step up on 6" using SPC STS w/OHP on airex x10  01/31/22 Calf stretches 4x20 Walk with SPC SBA 3x100 feet NuStep level 5 x 6 minutes Right leg HS curls 10# 3x6 Right only leg extension 5#  3x10 Leg press 20# both legs x 10, then right only 2x10 some guarding of the right knee Standing volley all with beach ball, ball kicks, going slow due to issues with right knee Gait with SPC outside uneven terrain, curb, CGA x 200 feet  01/25/22 Nustep L5x6mins 26mle rockers DF/PF Ankle redTB x10 DF/PF  Knee ext 15# 2x10 up w/BLE but down on  RLE, 10# RLE  x10 Knee flexion 25# 2x10, 10# RLE x10   01/23/22 Nustep L5 x64mns STS with OHP yellow 2x5 Step ups on Airex in RW 2x5 each side Resisted gait 20# forwards and side steps x3 Standing hip abd/ext 5# 2x10     01/12/22 Nustep L5 x632ms  Ankle ROM on disc Walking w/o AFO Side step ups 2HHA SL cone taps with RLE STS with OHP yellow ball x10  Resisted gait 20# forwards and side steps x3  01/09/22  Nustep L3 x6m24m Walking without AD  Fitter 2x10  Step ups 6"  Knee ext 10# bilat R  eccentric, 5# RLE only x10 HS curls 5# x10 RLE only   01/05/22 Bike L3 x6mi69mReassess BERG  Walking w/o AD in // bars w/AFO (forwards and side steps) Standing ankle ROM on rocker w/o AFO x10 DF/PF and IV/EV Heel taps on 6" 20reps Gastroc stretch seated w/strap 30s each  Stair training    01/03/22 Nustep L3 x5min73meight shifts in // bars 2 way hip 2# 2x10 Standing marches in // bars 2# 2x10  LAQ 2# 2x10 RLE  STS elevated mat 2x10    Patient's gait assessed without AFO, agree she doe snot need to use it when indoors, but recommend using it when out of the house for safety due to fatigue. BERG assessed-41/56 Practiced forward weight shifts over RLE without hyperextension at parallel bars, difficutly Wbing with R knee control.  PATIENT EDUCATION: Education details: PT POC, Eval results, initial HEP Person educated: Patient Education method: Explanation, Demonstration, and Handouts Education comprehension: verbalized understanding and returned demonstration   HOME EXERCISE PROGRAM: Access Code: [email or web address] Date: 12/13/2021 Prepared by: [facility]  Exercises - Seated Heel Slide  - 1-2 x daily - 7 x weekly - 1 sets - 5-10 reps - 10-15 sec hold - Sit to Stand with Counter Support  - 1-2 x daily - 7 x weekly - 1-2 sets - 5 reps - Seated Gluteal Sets  - 1-2 x daily - 7 x weekly - 1-2 sets - 10 reps - 3 sec hold    GOALS: Goals reviewed with patient? Yes  SHORT TERM GOALS: Target date: 01/10/2022  Pt will be independent with HEP for improved strength, balance, gait. Baseline:  Initial HEP from [facility] Goal status: met  2.  Pt will improve 5x sit<>stand to less than or equal to 20 sec to demonstrate improved functional strength and transfer efficiency. Baseline: 29.13 sec with BUE support (scores >15 sec indicate fall risk), 18.71s no UE support Goal status: met  3.  Patient will score at least 50  or more on BERG to demonstrate improved balance and decreased risk for falls.. Baseline: 41, 44/56-8/16 Goal status: IN PROGRESS   LONG TERM GOALS: Target date: 02/07/2022  Pt will be independent with HEP for improved strength, balance, transfers, and gait. Baseline:  Initial HEP from [facility] Goal status: ongoing  2.  Pt will improve 5x sit<>stand to less than or equal to 15 sec to demonstrate improved functional strength and transfer efficiency. Baseline: 29.13 sec with BUE support (scores >15 sec indicate fall risk), 18.71s- 8/16 Goal status: IN PROGRESS  3.  Pt will improve TUG score to less than or equal to 25 sec for decreased fall risk. Baseline: 42.62 sec (indicates increased fall risk and difficulty with ADLs in home), 18.12s-8/16 Goal status: MET  4.  Pt will improve gait velocity to at least  1.8 ft/sec for improved gait efficiency and safety. Baseline: 1.37 ft/sec (1.31-2.62 indicates increased limited community ambulator), 1.01 ft/sec Goal status: MET   ASSESSMENT:  CLINICAL IMPRESSION: Patient is doing well, slowly building confidence with gait. We walked outside with a SPC and on grass, no stumbling or tripping. Walked on black mat, she is fearful and hesitant so we trialed with SPC and then without. She does well and is steady with forwards, backwards, and side steps but RLE still slow and gets stuck. STS on black folded mat requires 2HHA to push up. Good improvements with balance, can progress as tolerated.   OBJECTIVE IMPAIRMENTS Abnormal gait, decreased balance, decreased mobility, difficulty walking, decreased ROM, decreased strength, impaired flexibility, and impaired tone.   ACTIVITY LIMITATIONS standing, transfers, locomotion level, and caring for others  PARTICIPATION LIMITATIONS: meal prep, driving, shopping, community activity, and occupation  PERSONAL FACTORS 3+ comorbidities: see above for PMH  are also affecting patient's functional outcome.   REHAB  POTENTIAL: Good  CLINICAL DECISION MAKING: Evolving/moderate complexity  EVALUATION COMPLEXITY: Moderate  PLAN: PT FREQUENCY: 2x/week  PT DURATION: 8 weeks plus eval  PLANNED INTERVENTIONS: Therapeutic exercises, Therapeutic activity, Neuromuscular re-education, Balance training, Gait training, Patient/Family education, Joint mobilization, Orthotic/Fit training, DME instructions, Aquatic Therapy, and Manual therapy  PLAN FOR NEXT SESSION: quad/hamstring strengthening, gait training w/o AFO and AD, balance on airex    Andris Baumann, PT 02/20/2022, 1:17 PM    [place] [facility]

## 2022-02-21 ENCOUNTER — Ambulatory Visit (INDEPENDENT_AMBULATORY_CARE_PROVIDER_SITE_OTHER): Payer: Medicaid Other | Admitting: Surgical

## 2022-02-21 ENCOUNTER — Ambulatory Visit: Payer: Medicaid Other | Admitting: Plastic Surgery

## 2022-02-21 DIAGNOSIS — C50412 Malignant neoplasm of upper-outer quadrant of left female breast: Secondary | ICD-10-CM

## 2022-02-21 DIAGNOSIS — Z170 Estrogen receptor positive status [ER+]: Secondary | ICD-10-CM

## 2022-02-21 NOTE — Therapy (Signed)
OUTPATIENT PHYSICAL THERAPY NEURO TREATMENT   Patient Name: Sheryl Mcmillan MRN: [id number] DOB:1975-07-02, 46 y.o., female Today's Date: 02/22/2022   PCP: Windell Hummingbird, PA-C REFERRING PROVIDER: Marlowe Shores, PA-C (to f/u with Izora Ribas, MD)   PT End of Session - 02/22/22 1235     Visit Number 15    PT Start Time 1233    PT Stop Time 1315    PT Time Calculation (min) 42 min    Equipment Utilized During Treatment Gait belt    Activity Tolerance Patient tolerated treatment well    Behavior During Therapy WFL for tasks assessed/performed                    Past Medical History:  Diagnosis Date   Arthritis    Breast cancer ([place])    Family history of non-Hodgkin's lymphoma 06/01/2020   GERD (gastroesophageal reflux disease)    Hemorrhoids    Migraines    PCOS (polycystic ovarian syndrome)    PONV (postoperative nausea and vomiting)    Past Surgical History:  Procedure Laterality Date   APPLICATION OF CRANIAL NAVIGATION Left 11/10/2021   Procedure: APPLICATION OF CRANIAL NAVIGATION;  Surgeon: Consuella Lose, MD;  Location: [place];  Service: Neurosurgery;  Laterality: Left;   AXILLARY SENTINEL NODE BIOPSY Left 11/07/2020   Procedure: LEFT AXILLARY SENTINEL NODE BIOPSY;  Surgeon: [name], MD;  Location: [place];  Service: General;  Laterality: Left;   BREAST CYST EXCISION Right 09/22/2021   Procedure: Excision of right axillary excess soft tissue.;  Surgeon: [name], MD;  Location: [place];  Service: Plastics;  Laterality: Right;   BREAST RECONSTRUCTION WITH PLACEMENT OF TISSUE EXPANDER AND FLEX HD (ACELLULAR HYDRATED DERMIS) Bilateral 11/07/2020   Procedure: BILATERAL BREAST RECONSTRUCTION WITH PLACEMENT OF TISSUE EXPANDER AND FLEX HD (ACELLULAR HYDRATED DERMIS);  Surgeon: [name], MD;  Location: [place];  Service: Plastics;  Laterality: Bilateral;   COSMETIC SURGERY     CRANIOTOMY Left 11/10/2021   Procedure: LT FRONTAL  CRANIOTOMY TUMOR EXCISION;  Surgeon: Consuella Lose, MD;  Location: [place];  Service: Neurosurgery;  Laterality: Left;   DILATION AND CURETTAGE OF UTERUS     HEMORRHOID SURGERY     IR IMAGING GUIDED PORT INSERTION  06/15/2020   LIPOSUCTION WITH LIPOFILLING Bilateral 09/22/2021   Procedure: Fat grafting bilateral breast;  Surgeon: [name], MD;  Location: [place];  Service: Plastics;  Laterality: Bilateral;   MASS EXCISION Bilateral 04/25/2021   Procedure: excision of bilateral axillary breast tissue;  Surgeon: [name], MD;  Location: [place];  Service: Plastics;  Laterality: Bilateral;   PORT-A-CATH REMOVAL Right 11/07/2020   Procedure: REMOVAL PORT-A-CATH;  Surgeon: [name], MD;  Location: [place];  Service: General;  Laterality: Right;   REMOVAL OF BILATERAL TISSUE EXPANDERS WITH PLACEMENT OF BILATERAL BREAST IMPLANTS Bilateral 04/25/2021   Procedure: REMOVAL OF BILATERAL TISSUE EXPANDERS WITH PLACEMENT OF BILATERAL BREAST IMPLANTS;  Surgeon: [name], MD;  Location: [place];  Service: Plastics;  Laterality: Bilateral;   ROBOTIC ASSISTED TOTAL HYSTERECTOMY WITH BILATERAL SALPINGO OOPHERECTOMY Bilateral 03/14/2021   Procedure: XI ROBOTIC ASSISTED TOTAL HYSTERECTOMY WITH BILATERAL SALPINGO OOPHORECTOMY;  Surgeon: Everitt Amber, MD;  Location: [facility];  Service: Gynecology;  Laterality: Bilateral;   TOTAL MASTECTOMY Bilateral 11/07/2020   Procedure: BILATERAL MASTECTOMY;  Surgeon: [name], MD;  Location: [place];  Service: General;  Laterality: Bilateral;   Patient Active  Problem List   Diagnosis Date Noted   Brain tumor ([place]) 11/08/2021   Metastatic cancer to brain ([place]) 11/08/2021   Cerebral edema ([place]) 11/08/2021   Complex partial seizure ([place]) 11/08/2021   Brain mass 11/07/2021   Right sided weakness 11/07/2021   IDA (iron deficiency anemia) 07/18/2021   Cholelithiasis 03/14/2021   Peripheral  neuropathy due to chemotherapy ([place]) 10/20/2020   Morbid obesity with BMI of 40.0-44.9, adult ([place]) 07/19/2020   BRCA1 gene mutation positive 07/04/2020   Genetic testing 06/28/2020   Family history of non-Hodgkin's lymphoma 06/01/2020   Malignant neoplasm of upper-outer quadrant of left breast in female, estrogen receptor positive ([place]) 05/26/2020    ONSET DATE: 12/05/2021 (MD referral)  REFERRING DIAG: D49.6 (ICD-10-CM) - Neoplasm of unspecified behavior of brain  THERAPY DIAG:  Aftercare following surgery for neoplasm  Unsteadiness on feet  Other lack of coordination  Other abnormalities of gait and mobility  Muscle weakness (generalized)  Abnormal posture  Rationale for Evaluation and Treatment Rehabilitation  SUBJECTIVE:                                                                                                                                                                                              SUBJECTIVE STATEMENT: Walking without walker, using SPC. This is my first time out the house with it, I am nervous.   PERTINENT HISTORY: metastatic breast cancer, brain tumor s/p excision with craniotomy; GERD, morbid obesity.  Admitted to rehab ([facility]) 11/17/21 and d/c home 12/09/2021  PAIN:  Are you having pain? No  PRECAUTIONS: Fall and Other: wearing R GRAFO, no driving  FALLS: Has patient fallen in last 6 months? 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 Now s/p L frontal crani tumor excision 5/19.  COGNITION: Overall cognitive status: Within functional limits for tasks assessed   SENSATION:  Just starting to get sensation back on bottom of R foot Light touch: WFL  COORDINATION: Decreased coordination RLE-needs assist of LLE or UEs to place RLE   EDEMA:  Noted in RLE-pt reports   MUSCLE TONE: RLE:  Hypotonic   POSTURE: rounded shoulders  LOWER EXTREMITY ROM:     Passive  Right Eval Right 8/16  Hip flexion    Hip extension    Hip abduction    Hip adduction    Hip internal rotation    Hip external rotation    Knee flexion    Knee extension    Ankle dorsiflexion -10 from neutral neutral  Ankle plantarflexion    Ankle inversion    Ankle eversion     (Blank rows = not tested)  LOWER EXTREMITY MMT:    MMT Right Eval Left Eval Right 02/07/22 Left  02/07/22  Hip flexion 3-/5 4/5 3+ 4  Hip extension      Hip abduction 3+/5 4/5 4 4   Hip adduction 3+/5 4/5 4 4   Hip internal rotation      Hip external rotation      Knee flexion 3-/5 4+/5 3+ 5  Knee extension 3-/5 4+/5 3+ 5  Ankle dorsiflexion trace 5/5 3+ 5  Ankle plantarflexion      Ankle inversion      Ankle eversion      (Blank rows = not tested)   TRANSFERS: Assistive device utilized: Environmental consultant - 2 wheeled  Sit to stand: SBA Stand to sit: SBA  GAIT: Gait pattern: step through pattern, decreased step length- Left, decreased stance time- Right, decreased ankle dorsiflexion- Right, and genu recurvatum- Right Distance walked: 60 ft  Assistive device utilized: Walker - 2 wheeled and R AFO Level of assistance: SBA Comments: Pt is able to don R AFO  FUNCTIONAL TESTs:  5 times sit to stand: 29.13 Timed up and go (TUG): 42.62 Gait velocity:  23.87 sec = 1.37 ft/sec 5x sit<>stand performed with UE support  PATIENT SURVEYS:  FOTO N/T  TREATMENT 02/22/22 Nustep L5 x54mns Treadmill 3%, 125m 5 mins  Cone taps  standing on airex w/SPC x10  Lateral steps on airex x10 w/SPC  Lateral band walks greenTB   02/20/22 Walking outside w/cane small loop and on grass  Nustep L5 x6m93m  Walking on black mat  with and w/o AD forwards and backwards Side steps on black mat  STS on black mat 4x5 Shoulder flexion 3# WaTE on folded mat x10  02/15/22 Nustep L5 x6mi73m Step overs 2" both sides Standing on airex hitting ball  Marching on airex 1HHA  Leg press 20# 2x10, RLE eccentric x10, RLE 20# x10 Calf raises on leg press 20# 2x12    02/13/22 Nustep L5 x6min64malking outside with SPC [facility]  Navigating curbs with SPC Side step ups 6" 2HHA [place] kicks 2x5 each side Walking on beam in // bars     02/08/22 Nustep L5 x6mins41mreadmill 2.5%, 0.8mph 515ms  76mcurls 25# 2x10, 10# RLE x10 Leg ext 15# 2x10, 10# RLE x10 Leg press 20# 2x10, 10# RLE x10  Calf raises on leg press 2x15  02/07/22 Nustep L5 x6mins RA5BXUXday (assessed goals and re-did tests)  5xSTS- 18.71s TUG- 18.12s Side steps over obstacles  Step up on 6" using SPC STS w/OHP on airex x10  01/31/22 Calf stretches 4x20 Walk with SPC SBA 3x100 feet NuStep level 5 x 6 minutes Right leg HS curls 10# 3x6 Right only leg extension 5#  3x10 Leg press 20# both legs x 10, then right only 2x10 some guarding of the right knee Standing volley all with beach ball, ball kicks, going slow due to issues with right knee Gait with SPC outside  uneven terrain, curb, CGA x 200 feet  01/25/22 Nustep L5x75mns Ankle rockers DF/PF Ankle redTB x10 DF/PF  Knee ext 15# 2x10 up w/BLE but down on RLE, 10# RLE  x10 Knee flexion 25# 2x10, 10# RLE x10   01/23/22 Nustep L5 x640ms STS with OHP yellow 2x5 Step ups on Airex in RW 2x5 each side Resisted gait 20# forwards and side steps x3 Standing hip abd/ext 5# 2x10     01/12/22 Nustep L5 x6m64m  Ankle ROM on disc Walking w/o AFO Side step ups 2HHA SL cone taps with RLE STS with OHP yellow ball x10   Resisted gait 20# forwards and side steps x3  01/09/22  Nustep L3 x6mi38mWalking without AD  Fitter 2x10  Step ups 6"  Knee ext 10# bilat R eccentric, 5# RLE only x10 HS curls 5# x10 RLE only   01/05/22 Bike L3 x6min46meassess BERG  Walking w/o AD in // bars w/AFO (forwards and side steps) Standing ankle ROM on rocker w/o AFO x10 DF/PF and IV/EV Heel taps on 6" 20reps Gastroc stretch seated w/strap 30s each  Stair training    01/03/22 Nustep L3 x5mins63might shifts in // bars 2 way hip 2# 2x10 Standing marches in // bars 2# 2x10  LAQ 2# 2x10 RLE  STS elevated mat 2x10    Patient's gait assessed without AFO, agree she doe snot need to use it when indoors, but recommend using it when out of the house for safety due to fatigue. BERG assessed-41/56 Practiced forward weight shifts over RLE without hyperextension at parallel bars, difficutly W[place] with R knee control.  PATIENT EDUCATION: Education details: PT POC, Eval results, initial HEP Person educated: Patient Education method: Explanation, Demonstration, and Handouts Education comprehension: verbalized understanding and returned demonstration   HOME EXERCISE PROGRAM: Access Code: [email or web address] Date: 12/13/2021 [facility]: [facility]  Exercises - Seated Heel Slide  - 1-2 x daily - 7 x weekly - 1 sets - 5-10 reps - 10-15 sec hold - Sit to Stand with Counter Support  - 1-2 x daily - 7 x weekly - 1-2 sets - 5 reps - Seated Gluteal Sets  - 1-2 x daily - 7 x weekly - 1-2 sets - 10 reps - 3 sec hold    GOALS: Goals reviewed with patient? Yes  SHORT TERM GOALS: Target date: 01/10/2022  Pt will be independent with HEP for improved strength, balance, gait. Baseline:  Initial HEP from [facility] Goal status: met  2.  Pt will improve 5x sit<>stand to less than or equal to 20 sec to demonstrate improved functional strength and transfer  efficiency. Baseline: 29.13 sec with BUE support (scores >15 sec indicate fall risk), 18.71s no UE support Goal status: met  3.  Patient will score at least 50 or more on BERG to demonstrate improved balance and decreased risk for falls.. Baseline: 41, 44/56-8/16 Goal status: IN PROGRESS   LONG TERM GOALS: Target date: 02/07/2022  Pt will be independent with HEP for improved strength, balance, transfers, and gait. Baseline:  Initial HEP from CIR Goal status: ongoing  2.  Pt will improve 5x sit<>stand to less than or equal to 15 sec to demonstrate improved functional strength and transfer efficiency. Baseline: 29.13 sec with BUE support (scores >15 sec indicate fall risk), 18.71s- 8/16 Goal status: IN PROGRESS  3.  Pt will improve TUG score to less than or equal to 25 sec for decreased fall risk.  Baseline: 42.62 sec (indicates increased fall risk and difficulty with ADLs in home), 18.12s-8/16 Goal status: MET  4.  Pt will improve gait velocity to at least 1.8 ft/sec for improved gait efficiency and safety. Baseline: 1.37 ft/sec (1.31-2.62 indicates increased limited community ambulator), 1.01 ft/sec Goal status: MET   ASSESSMENT:  CLINICAL IMPRESSION: Patient is showing improvement with gait, using a SPC to ambulate. Still walks with caution and is nervous. Slow and hesitant with single leg tasks like step ups and cone taps. R foot giving trouble today due to decrease control and placement.  OBJECTIVE IMPAIRMENTS Abnormal gait, decreased balance, decreased mobility, difficulty walking, decreased ROM, decreased strength, impaired flexibility, and impaired tone.   ACTIVITY LIMITATIONS standing, transfers, locomotion level, and caring for others  PARTICIPATION LIMITATIONS: meal prep, driving, shopping, community activity, and occupation  PERSONAL FACTORS 3+ comorbidities: see above for PMH  are also affecting patient's functional outcome.   REHAB POTENTIAL: Good  CLINICAL  DECISION MAKING: Evolving/moderate complexity  EVALUATION COMPLEXITY: Moderate  PLAN: PT FREQUENCY: 2x/week  PT DURATION: 8 weeks plus eval  PLANNED INTERVENTIONS: Therapeutic exercises, Therapeutic activity, Neuromuscular re-education, Balance training, Gait training, Patient/Family education, Joint mobilization, Orthotic/Fit training, DME instructions, Aquatic Therapy, and Manual therapy  PLAN FOR NEXT SESSION: quad/hamstring strengthening, gait training w/o AFO and AD, balance on airex    Andris Baumann, PT 02/22/2022, 1:13 PM    [facility]

## 2022-02-21 NOTE — Progress Notes (Signed)
   Referring Provider Windell Hummingbird, PA-C [address] [place],  [place] [address]   CC:  Chief Complaint  Patient presents with   Follow-up      Sheryl Mcmillan is an 46 y.o. female.  HPI: Patient is a 46 year old female here for follow-up on her bilateral breast reconstruction.  She currently has bilateral breast tissue expanders in place -790 cc.  She most recently underwent excision of right axillary tissue, abdominal liposuction for fat grafting to bilateral breast on 09/22/2021 with her face.  She has recovered well from this.  She is here with her daughter. She reports today to discuss nipple areola tattooing.  She does not have a history of radiation to either breast.  Of note, she did have metastatic brain cancer, underwent left frontal craniotomy tumor excision on 11/10/2021.  She seems to be healing well from this, she is ambulating with a walker.  She is currently going to physical therapy for this.  Review of Systems General: No fevers or chills  Physical Exam    02/19/2022    1:08 PM 02/05/2022   10:12 AM 01/22/2022    9:59 AM  Vitals with BMI  Height '5\' 11"'$   '5\' 11"'$   Weight 295 lbs 299 lbs 8 oz 309 lbs  BMI 41.16 67.67 20.94  Systolic 709  628  Diastolic 88  98  Pulse 366  105    General:  No acute distress,  Alert and oriented, Non-Toxic, Normal speech and affect Breast: Bilateral breast incisions are intact and well-healed.  There is no erythema or cellulitic changes noted.  She overall has good symmetry, left breast does appear to be slightly longer and slightly more lateral.  There is no overlying skin changes.  I do not appreciate any areas of firmness.  Bilateral breast capsules are soft.  Assessment/Plan Patient is a 46 year old female status post bilateral breast reconstruction.  The area is well-healed.  She is a good candidate for bilateral nipple areola tattooing.  We discussed tattooing expectations, we discussed location.  We discussed estimated of  2-3 appointments depending on complexity of tattooing.  All of her questions were answered to her content in regard to this.  We will plan on 03/06/2022.  Pictures were obtained of the patient and placed in the chart with the patient's or guardian's permission.   Sheryl Mcmillan 02/21/2022, 2:28 PM

## 2022-02-22 ENCOUNTER — Ambulatory Visit: Payer: Medicaid Other

## 2022-02-22 DIAGNOSIS — R2689 Other abnormalities of gait and mobility: Secondary | ICD-10-CM

## 2022-02-22 DIAGNOSIS — M6281 Muscle weakness (generalized): Secondary | ICD-10-CM

## 2022-02-22 DIAGNOSIS — Z483 Aftercare following surgery for neoplasm: Secondary | ICD-10-CM

## 2022-02-22 DIAGNOSIS — R278 Other lack of coordination: Secondary | ICD-10-CM

## 2022-02-22 DIAGNOSIS — R2681 Unsteadiness on feet: Secondary | ICD-10-CM

## 2022-02-22 DIAGNOSIS — R293 Abnormal posture: Secondary | ICD-10-CM

## 2022-02-28 ENCOUNTER — Telehealth: Payer: Self-pay | Admitting: *Deleted

## 2022-02-28 NOTE — Telephone Encounter (Signed)
Call received requesting information to verify the size of allopatch used DOS 11/07/2020. Gave contact info to "Verdis Frederickson" for medical records to obtain op report.

## 2022-03-01 ENCOUNTER — Encounter: Payer: Self-pay | Admitting: Radiation Oncology

## 2022-03-01 NOTE — Progress Notes (Signed)
Telephone appointment. I verified patient's identity and began nursing interview. Patient doing well. No issues reported at this time.  Meaningful use complete. No chances of pregnancy.  Reminded patient of her 3:00pm-03/05/22 telephone appointment w/ Shona Simpson PA-C. I left my extension [phone] in case patient needs anything. Patient verbalized understanding.  Patient contact [phone]

## 2022-03-03 ENCOUNTER — Ambulatory Visit (HOSPITAL_COMMUNITY)
Admission: RE | Admit: 2022-03-03 | Discharge: 2022-03-03 | Disposition: A | Discharge status: Home / Self Care | Payer: Medicaid Other | Source: Ambulatory Visit | Attending: Radiation Oncology | Admitting: Radiation Oncology

## 2022-03-03 DIAGNOSIS — C7931 Secondary malignant neoplasm of brain: Secondary | ICD-10-CM | POA: Diagnosis present

## 2022-03-03 MED ORDER — GADOBUTROL 1 MMOL/ML IV SOLN
10.0000 mL | Freq: Once | INTRAVENOUS | Status: AC | PRN
  Administered 2022-03-03: 10 mL via INTRAVENOUS

## 2022-03-05 ENCOUNTER — Ambulatory Visit
Admission: RE | Admit: 2022-03-05 | Discharge: 2022-03-05 | Disposition: A | Discharge status: Home / Self Care | Payer: Medicaid Other | Source: Ambulatory Visit | Attending: Radiation Oncology | Admitting: Radiation Oncology

## 2022-03-05 ENCOUNTER — Telehealth: Payer: Self-pay

## 2022-03-05 ENCOUNTER — Inpatient Hospital Stay: Payer: Medicaid Other | Attending: Oncology

## 2022-03-05 DIAGNOSIS — D496 Neoplasm of unspecified behavior of brain: Secondary | ICD-10-CM

## 2022-03-05 DIAGNOSIS — Z170 Estrogen receptor positive status [ER+]: Secondary | ICD-10-CM

## 2022-03-05 NOTE — Telephone Encounter (Signed)
Gave message below

## 2022-03-05 NOTE — Telephone Encounter (Signed)
Patient is calling in stating she has an appointment for tattooing, wants to know how long afterwards she will be able to go swimming?

## 2022-03-05 NOTE — Progress Notes (Signed)
Radiation Oncology         [phone] ________________________________  Outpatient Follow Up - Conducted via telephone at patient request.  I spoke with the patient to conduct this consult visit via telephone. The patient was notified in advance and was offered an in person or telemedicine meeting to allow for face to face communication but instead preferred to proceed with a telephone visit.   Name: Sheryl Mcmillan        MRN: [id number]  Date of Service: 03/05/2022 DOB: July 21, 1975  OQ:[id number], Flonnie Hailstone, NP  Windell Hummingbird, PA-C     REFERRING PHYSICIAN: Windell Hummingbird, PA-C   DIAGNOSIS: The primary encounter diagnosis was Malignant neoplasm of upper-outer quadrant of left breast in female, estrogen receptor positive ([place]). A diagnosis of Brain tumor [facility]) was also pertinent to this visit.   HISTORY OF PRESENT ILLNESS: Sheryl Mcmillan is a 46 y.o. female with a diagnosis of left breast cancer with brain metastasis.  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  She received neoadjuvant chemotherapy, bilateral mastectomies without evidence of disease on the right but in the left residual T1cN1 disease was present, her tumor converted to triple negative.  She did not receive any other consolidative adjuvant therapy.  She underwent hysterectomy with BSO in September 2022.    She presented to the ED on 11/07/21 with right sided extremity weakness and CT showed a mass in the left brain. MRI with and without contrast showed a 2.2 cm partially solid left superior hemisphere mass with a large area of vasogenic edema.  She  received preoperative SRS on 11/10/21, and then surgical resection the same day. Final pathology revealed metastatic adenocarcinoma consistent with her known breast primary. She has been followed in  surveillance with Dr. Lindi Adie, and is due for PET imaging in November 2023. She had a surveillance MRI on 03/03/22 that showed a decrease in the size of the left superior frontal gyrus resection cavity with resolved mass effect and reduced T2 enhancement. Treatment related changes were otherwise noted and no other disease was present.    PREVIOUS RADIATION THERAPY:   11/10/2021 through 11/10/2021 Preoperative SRS Site Technique Total Dose (Gy) Dose per Fx (Gy) Completed Fx Beam Energies  Brain: Brain [id number] IMRT 18/18 18 1/1 [id number]     PAST MEDICAL HISTORY:  Past Medical History:  Diagnosis Date   Arthritis    Breast cancer ([place]    Family history of non-Hodgkin's lymphoma 06/01/2020   GERD (gastroesophageal reflux disease)    Hemorrhoids    Migraines    PCOS (polycystic ovarian syndrome)    PONV (postoperative nausea and vomiting)        PAST SURGICAL HISTORY: Past Surgical History:  Procedure Laterality Date   APPLICATION OF CRANIAL NAVIGATION Left 11/10/2021   Procedure: APPLICATION OF CRANIAL NAVIGATION;  Surgeon: NConsuella Lose MD;  [place]  Service: Neurosurgery;  Laterality: Left;   AXILLARY SENTINEL NODE BIOPSY Left 11/07/2020   Procedure: LEFT AXILLARY SENTINEL NODE BIOPSY;  Surgeon: WRolm Bookbinder MD;  [place]  Service: General;  Laterality: Left;   BREAST CYST EXCISION Right 09/22/2021   Procedure: Excision of right axillary excess soft tissue.;  Surgeon: PCindra Presume MD;  [place]  Service: Plastics;  Laterality: Right;   BREAST RECONSTRUCTION  WITH PLACEMENT OF TISSUE EXPANDER AND FLEX HD (ACELLULAR HYDRATED DERMIS) Bilateral 11/07/2020   Procedure: BILATERAL BREAST RECONSTRUCTION WITH PLACEMENT OF TISSUE EXPANDER AND FLEX HD (ACELLULAR HYDRATED DERMIS);  Surgeon: [name], MD;  Location: [place];  Service: Plastics;  Laterality: Bilateral;   COSMETIC SURGERY     CRANIOTOMY Left 11/10/2021    Procedure: LT FRONTAL CRANIOTOMY TUMOR EXCISION;  Surgeon: Consuella Lose, MD;  Location: [place];  Service: Neurosurgery;  Laterality: Left;   DILATION AND CURETTAGE OF UTERUS     HEMORRHOID SURGERY     IR IMAGING GUIDED PORT INSERTION  06/15/2020   LIPOSUCTION WITH LIPOFILLING Bilateral 09/22/2021   Procedure: Fat grafting bilateral breast;  Surgeon: [name], MD;  Location: [place];  Service: Plastics;  Laterality: Bilateral;   MASS EXCISION Bilateral 04/25/2021   Procedure: excision of bilateral axillary breast tissue;  Surgeon: [name], MD;  Location: [place];  Service: Plastics;  Laterality: Bilateral;   PORT-A-CATH REMOVAL Right 11/07/2020   Procedure: REMOVAL PORT-A-CATH;  Surgeon: [name], MD;  Location: [place];  Service: General;  Laterality: Right;   REMOVAL OF BILATERAL TISSUE EXPANDERS WITH PLACEMENT OF BILATERAL BREAST IMPLANTS Bilateral 04/25/2021   Procedure: REMOVAL OF BILATERAL TISSUE EXPANDERS WITH PLACEMENT OF BILATERAL BREAST IMPLANTS;  Surgeon: [name], MD;  Location: [place];  Service: Plastics;  Laterality: Bilateral;   ROBOTIC ASSISTED TOTAL HYSTERECTOMY WITH BILATERAL SALPINGO OOPHERECTOMY Bilateral 03/14/2021   Procedure: XI ROBOTIC ASSISTED TOTAL HYSTERECTOMY WITH BILATERAL SALPINGO OOPHORECTOMY;  Surgeon: Everitt Amber, MD;  Location[facility];  Service: Gynecology;  Laterality: Bilateral;   TOTAL MASTECTOMY Bilateral 11/07/2020   Procedure: BILATERAL MASTECTOMY;  Surgeon: [name], MD;  Location: [place];  Service: General;  Laterality: Bilateral;     FAMILY HISTORY:  Family History  Problem Relation Age of Onset   Hypertension Mother    Diabetes Mother    Cancer Mother 6       unknown primary   Other Mother        brain tumor; dx 47s   Non-Hodgkin's lymphoma Brother 71   Other Maternal Uncle 23       brain tumor   Breast cancer Other        MGM's niece; dx late 70s    Pancreatic cancer Neg Hx    Colon cancer Neg Hx    Endometrial cancer Neg Hx    Prostate cancer Neg Hx    Ovarian cancer Neg Hx      SOCIAL HISTORY:  reports that she quit smoking about 18 months ago. Her smoking use included cigarettes. She has a 5.00 pack-year smoking history. She has never been exposed to tobacco smoke. She has never used smokeless tobacco. She reports that she does not currently use alcohol. She reports current drug use. Drug: Marijuana. The patient is single and lives in [place].    ALLERGIES: Carboplatin, Other, and Wound dressing adhesive   MEDICATIONS:  Current Outpatient Medications  Medication Sig Dispense Refill   acetaminophen (TYLENOL) 325 MG tablet Take 1-2 tablets (325-650 mg total) by mouth every 4 (four) hours as needed for mild pain. (Patient not taking: Reported on 03/01/2022)     LORazepam (ATIVAN) 0.5 MG tablet 1 tab po 30 minutes prior to radiation or MRI scans 5 tablet 0   methocarbamol (ROBAXIN) 500 MG tablet Take 1 tablet (500 mg total) by mouth every 8 (eight) hours as needed for muscle spasms. (  Patient not taking: Reported on 03/01/2022) 20 tablet 0   No current facility-administered medications for this encounter.     REVIEW OF SYSTEMS: On review of systems, the patient reports that she is doing  well without headaches, visual or speech changes, dizziness, or movement disturbances. She did have a hard time with claustrophobia the first time she went for her MRI but did tolerate this most recent one with the help of Ativan. No other complaints are verbalized.   PHYSICAL EXAM:  Unable to assess due to encounter type.   ECOG = 1  0 - Asymptomatic (Fully active, able to carry on all predisease activities without restriction)  1 - Symptomatic but completely ambulatory (Restricted in physically strenuous activity but ambulatory and able to carry out work of a light or sedentary nature. For example, light housework, office work)  2 -  Symptomatic, <50% in bed during the day (Ambulatory and capable of all self care but unable to carry out any work activities. Up and about more than 50% of waking hours)  3 - Symptomatic, >50% in bed, but not bedbound (Capable of only limited self-care, confined to bed or chair 50% or more of waking hours)  4 - Bedbound (Completely disabled. Cannot carry on any self-care. Totally confined to bed or chair)  5 - Death   Eustace Pen MM, Creech RH, Tormey DC, et al. [phone]). "Toxicity and response criteria of the [facility]". [place] Oncol. 5 (6): 649-55    LABORATORY DATA:  Lab Results  Component Value Date   WBC 7.2 12/08/2021   HGB 13.3 12/08/2021   HCT 40.0 12/08/2021   MCV 85.7 12/08/2021   PLT 162 12/08/2021   Lab Results  Component Value Date   NA 135 12/08/2021   K 4.6 12/08/2021   CL 104 12/08/2021   CO2 19 (L) 12/08/2021   Lab Results  Component Value Date   ALT 69 (H) 11/18/2021   AST 29 11/18/2021   ALKPHOS 69 11/18/2021   BILITOT 0.5 11/18/2021      RADIOGRAPHY: MR Brain W Wo Contrast  Result Date: 03/04/2022 CLINICAL DATA:  46 year old female with metastatic breast cancer diagnosed with 2 cm left superior frontal gyrus metastasis in May. Status post surgical resection and postoperative IMRT in May. Restaging. EXAM: MRI HEAD WITHOUT AND WITH CONTRAST TECHNIQUE: Multiplanar, multiecho pulse sequences of the brain and surrounding structures were obtained without and with intravenous contrast. CONTRAST:  63m GADAVIST GADOBUTROL 1 MMOL/ML IV SOLN COMPARISON:  Post treatment brain MRI 11/12/2021. FINDINGS: Brain: Small T2 heterogeneous and hemosiderin resection cavity of the posterior left superior frontal gyrus has decreased in size since May (10-11 mm now series 8, image 23) with substantial reduction in surrounding regional T2 and FLAIR hyperintensity (series 4, image 42). Susceptibility there on DWI. Postcontrast axial images are series 1100  (erroneously named pre contrast along with series 600) and demonstrate stellate and slightly nodular enhancement of the resection cavity along with some intrinsic T1 hyperintensity. Area of enhancement is up to 15 mm (series 1100, image 234 and series 10, image 23) and does appear increased from the immediate postoperative MRI, although may correspond to areas of restricted diffusion along the resection cavity margins at that time (compare series 9 image 31 today to coronal DWI series 7, image 40 3 May). No regional mass effect. Overlying craniotomy with mild postoperative appearing smooth dural thickening. No other abnormal intracranial enhancement. Resolved mass effect on and improved patency of the left  lateral ventricle. Stable and normal gray and white matter signal elsewhere throughout the brain. No restricted diffusion to suggest acute infarction. No midline shift, ventriculomegaly, extra-axial collection or acute intracranial hemorrhage. Cervicomedullary junction and pituitary are within normal limits. Vascular: Major intracranial vascular flow voids are stable. Skull and upper cervical spine: Left vertex craniotomy. Visualized bone marrow signal is within normal limits. Negative visible cervical spine and spinal cord. Sinuses/Orbits: Stable.  Left sphenoid sinus retention cyst. Other: Mastoids remain clear. Grossly normal visible internal auditory structures. Mild postoperative changes to the superior scalp. IMPRESSION: 1. Decreased size of the left superior frontal gyrus resection cavity with resolved mass effect and substantially reduced regional T2/FLAIR hyperintensity compatible with disease regression. New stellate enhancement (series 1100) since the immediate postoperative MRI is favored to be treatment related. This can serve is a new post treatment baseline. 2. No new metastatic disease or intracranial abnormality identified. Electronically Signed   By: Genevie Ann M.D.   On: 03/04/2022 09:06        IMPRESSION/PLAN: 1. 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 We reviewed her imaging and fortunately continued stability of the postoperative cavity is being seen. We discussed the importance of continued evaluation with a 3 month MRI in surveillance. She will continue with medical oncology and has a PET scan scheduled in November.  This encounter was conducted via telephone.  The patient has provided two factor identification and has given verbal consent for this type of encounter and has been advised to only accept a meeting of this type in a secure network environment. The time spent during this encounter was 35 minutes including preparation, discussion, and coordination of the patient's care. The attendants for this meeting include  Hayden Pedro and [name]   During the encounter,  Hayden Pedro were located at [facility] Radiation Oncology Department.  Sheryl Mcmillan  was located at home.      Carola Rhine, [facility]   **Disclaimer: This note was dictated with voice recognition software. Similar sounding words can inadvertently be transcribed and this note may contain transcription errors which may not have been corrected upon publication of note.**

## 2022-03-06 ENCOUNTER — Encounter: Payer: Self-pay | Admitting: Surgical

## 2022-03-06 ENCOUNTER — Ambulatory Visit: Payer: Medicaid Other | Admitting: Surgical

## 2022-03-06 DIAGNOSIS — Z853 Personal history of malignant neoplasm of breast: Secondary | ICD-10-CM

## 2022-03-06 DIAGNOSIS — Z9013 Acquired absence of bilateral breasts and nipples: Secondary | ICD-10-CM | POA: Diagnosis not present

## 2022-03-06 DIAGNOSIS — Z170 Estrogen receptor positive status [ER+]: Secondary | ICD-10-CM

## 2022-03-06 NOTE — Progress Notes (Addendum)
NIPPLE AREOLAR TATTOO PROCEDURE  PREOPERATIVE DIAGNOSIS:  Acquired absence of bilateral nipple areolar   POSTOPERATIVE DIAGNOSIS: Acquired absence of bilateral nipple areolar    PROCEDURES: bilateral nipple areolar tattoo   ANESTHESIA: None  COMPLICATIONS: None.  JUSTIFICATION FOR PROCEDURE:  Sheryl Mcmillan is a 46 y.o. female with a history of breast cancer status post breast reconstruction. The patient presents for nipple areolar complex tattoo. Risks, benefits, indications, and alternatives of the above described procedures were discussed with the patient and all the patient's questions were answered. Consent was signed.  DESCRIPTION OF PROCEDURE: After informed consent was obtained and proper identification of patient and surgical site was made, the patient was taken to the procedure room and resting in procedure chair.  Patient's daughter was present and was helpful in providing feedback for assistance with positioning of the nipple areola tattoos, size, coloring.  The patient was prepped and draped in the usual sterile fashion. Attention was turned to the selection of flesh colored permanent tattoo ink to produce the appropriate color for nipple areolar complex tattooing. Color, size and location was confirmed with the patient and her daughter.  Pre-op photos were previously obtained and placed in patient chart with patient consent. Using a Digital Revo 7R pronged tattoo head, pigment was instilled to the designed nipple areolar complex, which was confirmed preoperatively with the patient.   The total area tattooed was 24.5 cm, each nipple areola tattoo was 12.5 square centimeter.  Once adequate pigment had been applied to the nipple areolar complex, vaseline followed by an occlusive dressing was applied. The patient tolerated the procedure well. There were no complications  We elected to not do any shading below the nipple to create a 3D effective this time due to her skin appearing  irritated.  Patient was pleased with the results, was instructed on dressing care and all of her questions were answered to her content.  A  ratio of Fair Honey and Dark Honey was used to create the desired color. 4 parts fair honey to 1 part dark honey-  Exp 06/2024

## 2022-03-07 ENCOUNTER — Ambulatory Visit: Payer: Medicaid Other | Admitting: Physical Therapy

## 2022-03-09 ENCOUNTER — Ambulatory Visit: Payer: Medicaid Other | Attending: Physician Assistant | Admitting: Physical Therapy

## 2022-03-09 DIAGNOSIS — R2681 Unsteadiness on feet: Secondary | ICD-10-CM | POA: Insufficient documentation

## 2022-03-09 DIAGNOSIS — R278 Other lack of coordination: Secondary | ICD-10-CM | POA: Insufficient documentation

## 2022-03-09 DIAGNOSIS — R2689 Other abnormalities of gait and mobility: Secondary | ICD-10-CM | POA: Insufficient documentation

## 2022-03-09 DIAGNOSIS — R293 Abnormal posture: Secondary | ICD-10-CM | POA: Insufficient documentation

## 2022-03-09 DIAGNOSIS — Z483 Aftercare following surgery for neoplasm: Secondary | ICD-10-CM | POA: Insufficient documentation

## 2022-03-09 DIAGNOSIS — M6281 Muscle weakness (generalized): Secondary | ICD-10-CM | POA: Insufficient documentation

## 2022-03-12 NOTE — Therapy (Signed)
OUTPATIENT PHYSICAL THERAPY NEURO TREATMENT   Patient Name: VALEDA CORZINE MRN: [id number] DOB:Jan 22, 1976, 46 y.o., female Today's Date: 03/13/2022   PCP: Windell Hummingbird, PA-C REFERRING PROVIDER: Marlowe Shores, PA-C (to f/u with Izora Ribas, MD)   PT End of Session - 03/13/22 1231     Visit Number 16    PT Start Time 1230    PT Stop Time 1312    PT Time Calculation (min) 42 min    Equipment Utilized During Treatment --    Activity Tolerance Patient tolerated treatment well    Behavior During Therapy [facility] for tasks assessed/performed                     Past Medical History:  Diagnosis Date   Arthritis    Breast cancer ([place])    Family history of non-Hodgkin's lymphoma 06/01/2020   GERD (gastroesophageal reflux disease)    Hemorrhoids    Migraines    PCOS (polycystic ovarian syndrome)    PONV (postoperative nausea and vomiting)    Past Surgical History:  Procedure Laterality Date   APPLICATION OF CRANIAL NAVIGATION Left 11/10/2021   Procedure: APPLICATION OF CRANIAL NAVIGATION;  Surgeon: Consuella Lose, MD;  Location: [place];  Service: Neurosurgery;  Laterality: Left;   AXILLARY SENTINEL NODE BIOPSY Left 11/07/2020   Procedure: LEFT AXILLARY SENTINEL NODE BIOPSY;  Surgeon: [name], MD;  Location: [place];  Service: General;  Laterality: Left;   BREAST CYST EXCISION Right 09/22/2021   Procedure: Excision of right axillary excess soft tissue.;  Surgeon: [name], MD;  Location: [place];  Service: Plastics;  Laterality: Right;   BREAST RECONSTRUCTION WITH PLACEMENT OF TISSUE EXPANDER AND FLEX HD (ACELLULAR HYDRATED DERMIS) Bilateral 11/07/2020   Procedure: BILATERAL BREAST RECONSTRUCTION WITH PLACEMENT OF TISSUE EXPANDER AND FLEX HD (ACELLULAR HYDRATED DERMIS);  Surgeon: [name], MD;  Location: [place];  Service: Plastics;  Laterality: Bilateral;   COSMETIC SURGERY     CRANIOTOMY Left 11/10/2021   Procedure: LT FRONTAL  CRANIOTOMY TUMOR EXCISION;  Surgeon: Consuella Lose, MD;  Location: [place];  Service: Neurosurgery;  Laterality: Left;   DILATION AND CURETTAGE OF UTERUS     HEMORRHOID SURGERY     IR IMAGING GUIDED PORT INSERTION  06/15/2020   LIPOSUCTION WITH LIPOFILLING Bilateral 09/22/2021   Procedure: Fat grafting bilateral breast;  Surgeon: [name], MD;  Location: [place];  Service: Plastics;  Laterality: Bilateral;   MASS EXCISION Bilateral 04/25/2021   Procedure: excision of bilateral axillary breast tissue;  Surgeon: [name], MD;  Location: [place];  Service: Plastics;  Laterality: Bilateral;   PORT-A-CATH REMOVAL Right 11/07/2020   Procedure: REMOVAL PORT-A-CATH;  Surgeon: [name], MD;  Location: [place];  Service: General;  Laterality: Right;   REMOVAL OF BILATERAL TISSUE EXPANDERS WITH PLACEMENT OF BILATERAL BREAST IMPLANTS Bilateral 04/25/2021   Procedure: REMOVAL OF BILATERAL TISSUE EXPANDERS WITH PLACEMENT OF BILATERAL BREAST IMPLANTS;  Surgeon: [name], MD;  Location: [place];  Service: Plastics;  Laterality: Bilateral;   ROBOTIC ASSISTED TOTAL HYSTERECTOMY WITH BILATERAL SALPINGO OOPHERECTOMY Bilateral 03/14/2021   Procedure: XI ROBOTIC ASSISTED TOTAL HYSTERECTOMY WITH BILATERAL SALPINGO OOPHORECTOMY;  Surgeon: Everitt Amber, MD;  Location: [facility];  Service: Gynecology;  Laterality: Bilateral;   TOTAL MASTECTOMY Bilateral 11/07/2020   Procedure: BILATERAL MASTECTOMY;  Surgeon: [name], MD;  Location: [place];  Service: General;  Laterality: Bilateral;   Patient Active  Problem List   Diagnosis Date Noted   Brain tumor ([place]) 11/08/2021   Metastatic cancer to brain ([place]) 11/08/2021   Cerebral edema ([place]) 11/08/2021   Complex partial seizure ([place]) 11/08/2021   Brain mass 11/07/2021   Right sided weakness 11/07/2021   IDA (iron deficiency anemia) 07/18/2021   Cholelithiasis 03/14/2021   Peripheral  neuropathy due to chemotherapy ([place]) 10/20/2020   Morbid obesity with BMI of 40.0-44.9, adult ([place]) 07/19/2020   BRCA1 gene mutation positive 07/04/2020   Genetic testing 06/28/2020   Family history of non-Hodgkin's lymphoma 06/01/2020   Malignant neoplasm of upper-outer quadrant of left breast in female, estrogen receptor positive ([place]) 05/26/2020    ONSET DATE: 12/05/2021 (MD referral)  REFERRING DIAG: D49.6 (ICD-10-CM) - Neoplasm of unspecified behavior of brain  THERAPY DIAG:  Aftercare following surgery for neoplasm  Unsteadiness on feet  Other lack of coordination  Other abnormalities of gait and mobility  Muscle weakness (generalized)  Rationale for Evaluation and Treatment Rehabilitation  SUBJECTIVE:                                                                                                                                                                                              SUBJECTIVE STATEMENT: Everything is fine, no falls or trips. Not using anything in the house to walk.   PERTINENT HISTORY: metastatic breast cancer, brain tumor s/p excision with craniotomy; GERD, morbid obesity.  Admitted to rehab ([facility]) 11/17/21 and d/c home 12/09/2021  PAIN:  Are you having pain? No  PRECAUTIONS: Fall and Other: wearing R GRAFO, no driving  FALLS: Has patient fallen in last 6 months? 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 Now s/p L frontal crani tumor excision 5/19.  COGNITION: Overall cognitive status: Within functional limits for tasks assessed   SENSATION:  Just starting to get sensation back on bottom of R foot Light touch: WFL  COORDINATION: Decreased coordination RLE-needs assist of LLE or UEs to place RLE   EDEMA:  Noted in RLE-pt reports   MUSCLE TONE: RLE: Hypotonic   POSTURE: rounded shoulders  LOWER EXTREMITY ROM:     Passive  Right Eval Right 8/16  Hip flexion    Hip extension    Hip abduction    Hip adduction    Hip internal rotation    Hip external rotation    Knee flexion    Knee extension    Ankle dorsiflexion -10 from neutral neutral  Ankle plantarflexion    Ankle inversion    Ankle eversion     (Blank rows = not tested)  LOWER EXTREMITY MMT:    MMT Right Eval Left Eval Right 02/07/22 Left  02/07/22  Hip flexion 3-/5 4/5 3+ 4  Hip extension      Hip abduction 3+/5 4/5 4 4   Hip adduction 3+/5 4/5 4 4   Hip internal rotation      Hip external rotation      Knee flexion 3-/5 4+/5 3+ 5  Knee extension 3-/5 4+/5 3+ 5  Ankle dorsiflexion trace 5/5 3+ 5  Ankle plantarflexion      Ankle inversion      Ankle eversion      (Blank rows = not tested)   TRANSFERS: Assistive device utilized: Environmental consultant - 2 wheeled  Sit to stand: SBA Stand to sit: SBA  GAIT: Gait pattern: step through pattern, decreased step length- Left, decreased stance time- Right, decreased ankle dorsiflexion- Right, and genu recurvatum- Right Distance walked: 60 ft  Assistive device utilized: Walker - 2 wheeled and R AFO Level of assistance: SBA Comments: Pt is able to don R AFO  FUNCTIONAL TESTs:  5 times sit to stand: 29.13 Timed up and go (TUG): 42.62 Gait velocity:  23.87 sec = 1.37 ft/sec 5x sit<>stand performed with UE support  PATIENT SURVEYS:  FOTO N/T  TREATMENT 03/13/22 Walking outside w/SPC  Nustep L5 x24mns  Step ups 6" x10 each side  Calf raises  2x12 4 way step over 3x clockwise, 3x counterclockwise  STS on airex w/OHP x10 Standing on airex rotations w/yellow ball 2x5  02/22/22 Nustep L5 x642ms Treadmill 3%, 19m27m5 mins  Cone taps standing on airex w/SPC x10  Lateral steps on airex x10 w/SPC  Lateral band walks greenTB   02/20/22 Walking outside w/cane small loop and on grass  Nustep L5 x6mi219m Walking on black mat  with and w/o AD forwards and backwards Side steps on black mat  STS on black mat 4x5 Shoulder flexion 3# WaTE on folded mat x10  02/15/22 Nustep L5 x6min43mStep overs 2" both sides Standing on airex hitting ball  Marching on airex 1HHA  Leg press 20# 2x10, RLE eccentric x10, RLE 20# x10 Calf raises on leg press 20# 2x12    02/13/22 Nustep L5 x6mins74mlking outside [facility]  Navigating curbs with SPC Side step ups 6" 2HHA BDes Moineskicks 2x5 each side Walking on beam in // bars     02/08/22 Nustep L5 x6mins 87meadmill 2.5%, 0.8mph 40m38m  H57murls 25# 2x10, 10# RLE x10 Leg ext 15# 2x10, 10# RLE x10 Leg press 20# 2x10, 10# RLE x10  Calf raises on leg press 2x15  02/07/22 Nustep L5 x6mins [id number] (assessed goals and re-did tests)  5xSTS- 18.71s TUG- 18.12s Side steps over obstacles  Step up on 6" using SPC STS w/OHP on airex x10  01/31/22 Calf stretches 4x20 Walk with SPC SBA 3x100 feet NuStep level 5 x 6 minutes Right leg HS curls 10# 3x6 Right only leg extension 5#  3x10 Leg press 20#  both legs x 10, then right only 2x10 some guarding of the right knee Standing volley all with beach ball, ball kicks, going slow due to issues with right knee Gait with SPC outside uneven terrain, curb, CGA x 200 feet  01/25/22 Nustep L5x9mns Ankle rockers DF/PF Ankle redTB x10 DF/PF  Knee ext 15# 2x10 up w/BLE but down on RLE, 10# RLE  x10 Knee flexion 25# 2x10, 10# RLE x10   01/23/22 Nustep L5 x638ms STS with OHP yellow 2x5 Step ups on Airex in RW 2x5 each side Resisted gait 20# forwards  and side steps x3 Standing hip abd/ext 5# 2x10     01/12/22 Nustep L5 x6m58m  Ankle ROM on disc Walking w/o AFO Side step ups 2HHA SL cone taps with RLE STS with OHP yellow ball x10  Resisted gait 20# forwards and side steps x3  01/09/22  Nustep L3 x6mi92mWalking without AD  Fitter 2x10  Step ups 6"  Knee ext 10# bilat R eccentric, 5# RLE only x10 HS curls 5# x10 RLE only   01/05/22 Bike L3 x6min20meassess BERG  Walking w/o AD in // bars w/AFO (forwards and side steps) Standing ankle ROM on rocker w/o AFO x10 DF/PF and IV/EV Heel taps on 6" 20reps Gastroc stretch seated w/strap 30s each  Stair training    01/03/22 Nustep L3 x5mins70might shifts in // bars 2 way hip 2# 2x10 Standing marches in // bars 2# 2x10  LAQ 2# 2x10 RLE  STS elevated mat 2x10    Patient's gait assessed without AFO, agree she doe snot need to use it when indoors, but recommend using it when out of the house for safety due to fatigue. BERG assessed-41/56 Practiced forward weight shifts over RLE without hyperextension at parallel bars, difficutly Wbing with R knee control.  PATIENT EDUCATION: Education details: PT POC, Eval results, initial HEP Person educated: Patient Education method: Explanation, Demonstration, and Handouts Education comprehension: verbalized understanding and returned demonstration   HOME EXERCISE PROGRAM: Access Code: [email or web address] Date: 12/13/2021 [facility]  Exercises - Seated Heel Slide  - 1-2 x daily - 7 x weekly - 1 sets - 5-10 reps - 10-15 sec hold - Sit to Stand with Counter Support  - 1-2 x daily - 7 x weekly - 1-2 sets - 5 reps - Seated Gluteal Sets  - 1-2 x daily - 7 x weekly - 1-2 sets - 10 reps - 3 sec hold    GOALS: Goals reviewed with patient? Yes  SHORT TERM GOALS: Target date: 01/10/2022  Pt will be independent with HEP for improved strength, balance,  gait. Baseline:  Initial HEP from CIR Goal status: met  2.  Pt will improve 5x sit<>stand to less than or equal to 20 sec to demonstrate improved functional strength and transfer efficiency. Baseline: 29.13 sec with BUE support (scores >15 sec indicate fall risk), 18.71s no UE support Goal status: met  3.  Patient will score at least 50 or more on BERG to demonstrate improved balance and decreased risk for falls.. Baseline: 41, 44/56-8/16 Goal status: IN PROGRESS   LONG TERM GOALS: Target date: 02/07/2022  Pt will be independent with HEP for improved strength, balance, transfers, and gait. Baseline:  Initial HEP from CIR Goal status: ongoing  2.  Pt will improve 5x sit<>stand to less than or equal to 15 sec to demonstrate improved functional strength and transfer efficiency. Baseline: 29.13 sec with  BUE support (scores >15 sec indicate fall risk), 18.71s- 8/16 Goal status: IN PROGRESS  3.  Pt will improve TUG score to less than or equal to 25 sec for decreased fall risk. Baseline: 42.62 sec (indicates increased fall risk and difficulty with ADLs in home), 18.12s-8/16 Goal status: MET  4.  Pt will improve gait velocity to at least 1.8 ft/sec for improved gait efficiency and safety. Baseline: 1.37 ft/sec (1.31-2.62 indicates increased limited community ambulator), 1.01 ft/sec Goal status: MET   ASSESSMENT:  CLINICAL IMPRESSION: Patient returns after 2 week hiatus. She is ambulating with SPC. Still slow and hesitant with single leg tasks such as step ups and step over obstacles. Most difficulty with backwards stepping. Has more movement in R ankle now.   OBJECTIVE IMPAIRMENTS Abnormal gait, decreased balance, decreased mobility, difficulty walking, decreased ROM, decreased strength, impaired flexibility, and impaired tone.   ACTIVITY LIMITATIONS standing, transfers, locomotion level, and caring for others  PARTICIPATION LIMITATIONS: meal prep, driving, shopping, community  activity, and occupation  PERSONAL FACTORS 3+ comorbidities: see above for PMH  are also affecting patient's functional outcome.   REHAB POTENTIAL: Good  CLINICAL DECISION MAKING: Evolving/moderate complexity  EVALUATION COMPLEXITY: Moderate  PLAN: PT FREQUENCY: 2x/week  PT DURATION: 8 weeks plus eval  PLANNED INTERVENTIONS: Therapeutic exercises, Therapeutic activity, Neuromuscular re-education, Balance training, Gait training, Patient/Family education, Joint mobilization, Orthotic/Fit training, DME instructions, Aquatic Therapy, and Manual therapy  PLAN FOR NEXT SESSION: quad/hamstring strengthening, gait training w/o AFO and AD, balance on airex    Andris Baumann, PT 03/13/2022, 1:12 PM    [place] [facility]

## 2022-03-13 ENCOUNTER — Ambulatory Visit: Payer: Medicaid Other

## 2022-03-13 DIAGNOSIS — R2681 Unsteadiness on feet: Secondary | ICD-10-CM

## 2022-03-13 DIAGNOSIS — R2689 Other abnormalities of gait and mobility: Secondary | ICD-10-CM | POA: Diagnosis present

## 2022-03-13 DIAGNOSIS — R278 Other lack of coordination: Secondary | ICD-10-CM | POA: Diagnosis present

## 2022-03-13 DIAGNOSIS — Z483 Aftercare following surgery for neoplasm: Secondary | ICD-10-CM | POA: Diagnosis present

## 2022-03-13 DIAGNOSIS — M6281 Muscle weakness (generalized): Secondary | ICD-10-CM | POA: Diagnosis present

## 2022-03-13 DIAGNOSIS — R293 Abnormal posture: Secondary | ICD-10-CM | POA: Diagnosis present

## 2022-03-19 ENCOUNTER — Encounter: Payer: Medicaid Other | Admitting: Physical Medicine and Rehabilitation

## 2022-03-19 NOTE — Therapy (Signed)
OUTPATIENT PHYSICAL THERAPY NEURO TREATMENT   Patient Name: Sheryl Mcmillan MRN: [id number] DOB:1976-03-06, 46 y.o., female Today's Date: 03/20/2022   PCP: Windell Hummingbird, PA-C REFERRING PROVIDER: Marlowe Shores, PA-C (to f/u with Izora Ribas, MD)   PT End of Session - 03/20/22 1235     Visit Number 17    PT Start Time 1194    PT Stop Time 1315    PT Time Calculation (min) 40 min    Activity Tolerance Patient tolerated treatment well    Behavior During Therapy [facility] for tasks assessed/performed                      Past Medical History:  Diagnosis Date   Arthritis    Breast cancer ([place])    Family history of non-Hodgkin's lymphoma 06/01/2020   GERD (gastroesophageal reflux disease)    Hemorrhoids    Migraines    PCOS (polycystic ovarian syndrome)    PONV (postoperative nausea and vomiting)    Past Surgical History:  Procedure Laterality Date   APPLICATION OF CRANIAL NAVIGATION Left 11/10/2021   Procedure: APPLICATION OF CRANIAL NAVIGATION;  Surgeon: Consuella Lose, MD;  Location: [place];  Service: Neurosurgery;  Laterality: Left;   AXILLARY SENTINEL NODE BIOPSY Left 11/07/2020   Procedure: LEFT AXILLARY SENTINEL NODE BIOPSY;  Surgeon: [name], MD;  Location: [place];  Service: General;  Laterality: Left;   BREAST CYST EXCISION Right 09/22/2021   Procedure: Excision of right axillary excess soft tissue.;  Surgeon: [name], MD;  Location: [place];  Service: Plastics;  Laterality: Right;   BREAST RECONSTRUCTION WITH PLACEMENT OF TISSUE EXPANDER AND FLEX HD (ACELLULAR HYDRATED DERMIS) Bilateral 11/07/2020   Procedure: BILATERAL BREAST RECONSTRUCTION WITH PLACEMENT OF TISSUE EXPANDER AND FLEX HD (ACELLULAR HYDRATED DERMIS);  Surgeon: [name], MD;  Location: [place];  Service: Plastics;  Laterality: Bilateral;   COSMETIC SURGERY     CRANIOTOMY Left 11/10/2021   Procedure: LT FRONTAL CRANIOTOMY TUMOR EXCISION;  Surgeon:  Consuella Lose, MD;  Location: [place];  Service: Neurosurgery;  Laterality: Left;   DILATION AND CURETTAGE OF UTERUS     HEMORRHOID SURGERY     IR IMAGING GUIDED PORT INSERTION  06/15/2020   LIPOSUCTION WITH LIPOFILLING Bilateral 09/22/2021   Procedure: Fat grafting bilateral breast;  Surgeon: [name], MD;  Location: [place];  Service: Plastics;  Laterality: Bilateral;   MASS EXCISION Bilateral 04/25/2021   Procedure: excision of bilateral axillary breast tissue;  Surgeon: [name], MD;  Location: [place];  Service: Plastics;  Laterality: Bilateral;   PORT-A-CATH REMOVAL Right 11/07/2020   Procedure: REMOVAL PORT-A-CATH;  Surgeon: [name], MD;  Location: [place];  Service: General;  Laterality: Right;   REMOVAL OF BILATERAL TISSUE EXPANDERS WITH PLACEMENT OF BILATERAL BREAST IMPLANTS Bilateral 04/25/2021   Procedure: REMOVAL OF BILATERAL TISSUE EXPANDERS WITH PLACEMENT OF BILATERAL BREAST IMPLANTS;  Surgeon: [name], MD;  Location: [place];  Service: Plastics;  Laterality: Bilateral;   ROBOTIC ASSISTED TOTAL HYSTERECTOMY WITH BILATERAL SALPINGO OOPHERECTOMY Bilateral 03/14/2021   Procedure: XI ROBOTIC ASSISTED TOTAL HYSTERECTOMY WITH BILATERAL SALPINGO OOPHORECTOMY;  Surgeon: Everitt Amber, MD;  Location: [facility];  Service: Gynecology;  Laterality: Bilateral;   TOTAL MASTECTOMY Bilateral 11/07/2020   Procedure: BILATERAL MASTECTOMY;  Surgeon: [name], MD;  Location: [place];  Service: General;  Laterality: Bilateral;   Patient Active Problem List   Diagnosis Date Noted  Brain tumor ([place]) 11/08/2021   Metastatic cancer to brain ([place]) 11/08/2021   Cerebral edema ([place]) 11/08/2021   Complex partial seizure ([place]) 11/08/2021   Brain mass 11/07/2021   Right sided weakness 11/07/2021   IDA (iron deficiency anemia) 07/18/2021   Cholelithiasis 03/14/2021   Peripheral neuropathy due to chemotherapy ([place])  10/20/2020   Morbid obesity with BMI of 40.0-44.9, adult ([place]) 07/19/2020   BRCA1 gene mutation positive 07/04/2020   Genetic testing 06/28/2020   Family history of non-Hodgkin's lymphoma 06/01/2020   Malignant neoplasm of upper-outer quadrant of left breast in female, estrogen receptor positive ([place]) 05/26/2020    ONSET DATE: 12/05/2021 (MD referral)  REFERRING DIAG: D49.6 (ICD-10-CM) - Neoplasm of unspecified behavior of brain  THERAPY DIAG:  Aftercare following surgery for neoplasm  Unsteadiness on feet  Other lack of coordination  Other abnormalities of gait and mobility  Muscle weakness (generalized)  Abnormal posture  Rationale for Evaluation and Treatment Rehabilitation  SUBJECTIVE:                                                                                                                                                                                              SUBJECTIVE STATEMENT: I am doing fine, I think at this point I just walk with the cane for security but don't really need it.   PERTINENT HISTORY: metastatic breast cancer, brain tumor s/p excision with craniotomy; GERD, morbid obesity.  Admitted to rehab ([facility]) 11/17/21 and d/c home 12/09/2021  PAIN:  Are you having pain? No  PRECAUTIONS: Fall and Other: wearing R GRAFO, no driving  FALLS: Has patient fallen in last 6 months? 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 Now s/p L frontal crani tumor excision 5/19.  COGNITION: Overall cognitive status: Within functional limits for tasks assessed   SENSATION:  Just starting to get sensation back on bottom of R foot Light touch: WFL  COORDINATION: Decreased coordination RLE-needs assist of LLE or UEs to place RLE   EDEMA:  Noted in RLE-pt reports   MUSCLE TONE: RLE: Hypotonic   POSTURE: rounded  shoulders  LOWER EXTREMITY ROM:     Passive  Right Eval Right 8/16  Hip flexion    Hip extension    Hip abduction    Hip adduction    Hip internal rotation    Hip external rotation    Knee flexion    Knee extension    Ankle dorsiflexion -10 from neutral neutral  Ankle plantarflexion    Ankle inversion    Ankle eversion     (Blank rows = not tested)  LOWER EXTREMITY MMT:    MMT Right Eval Left Eval Right 02/07/22 Left  02/07/22  Hip flexion 3-/5 4/5 3+ 4  Hip extension      Hip abduction 3+/5 4/5 4 4   Hip adduction 3+/5 4/5 4 4   Hip internal rotation      Hip external rotation      Knee flexion 3-/5 4+/5 3+ 5  Knee extension 3-/5 4+/5 3+ 5  Ankle dorsiflexion trace 5/5 3+ 5  Ankle plantarflexion      Ankle inversion      Ankle eversion      (Blank rows = not tested)   TRANSFERS: Assistive device utilized: Environmental consultant - 2 wheeled  Sit to stand: SBA Stand to sit: SBA  GAIT: Gait pattern: step through pattern, decreased step length- Left, decreased stance time- Right, decreased ankle dorsiflexion- Right, and genu recurvatum- Right Distance walked: 60 ft  Assistive device utilized: Walker - 2 wheeled and R AFO Level of assistance: SBA Comments: Pt is able to don R AFO  FUNCTIONAL TESTs:  5 times sit to stand: 29.13 Timed up and go (TUG): 42.62 Gait velocity:  23.87 sec = 1.37 ft/sec 5x sit<>stand performed with UE support  PATIENT SURVEYS:  FOTO N/T  TREATMENT 03/20/22 NuStep L6 x75mns  AnkleTB red x10 4 way Side steps over obstacles Beach ball kicks  x10 each leg Walking on beam forwards and side steps  Standing on airex cone taps in // bars     03/13/22 Walking outside w/SPC  Nustep L5 x680ms  Step ups 6" x10 each side  Calf raises 2x12 4 way step over 3x clockwise, 3x counterclockwise  STS on airex w/OHP x10 Standing on airex rotations w/yellow ball 2x5  02/22/22 Nustep L5 x6m42m Treadmill 3%, 1mp67m mins  Cone taps standing on airex w/SPC x10  Lateral steps on airex x10 w/SPC  Lateral band walks greenTB   02/20/22 Walking outside w/cane small loop and on grass  Nustep L5 x6min74mWalking on black mat  with and w/o AD forwards and backwards Side steps on black mat  STS on black mat 4x5 Shoulder flexion 3# WaTE on folded mat x10  02/15/22 Nustep L5 x6mins27mtep overs 2" both sides Standing on airex hitting ball  Marching on airex 1HHA  Leg press 20# 2x10, RLE eccentric x10, RLE 20# x10 Calf raises on leg press 20# 2x12    02/13/22 Nustep L5 x6mins 65mking outside with SPC on [facility]  Navigating curbs with SPC Side step ups 6" 2HHA Be[place]viewicks 2x5 each side Walking on beam in // bars     02/08/22 Nustep L5 x6mins  37madmill 2.5%, 0.8mph 5mi86m HS66mrls 25# 2x10, 10# RLE x10 Leg ext 15# 2x10, 10# RLE x10 Leg press 20# 2x10, 10# RLE x10  Calf raises on leg press 2x15  02/07/22 Nustep L5 x6mins [id number] (assessed goals and re-did tests)  5xSTS- 18.71s TUG- 18.12s Side steps over obstacles  Step up  on 6" using SPC STS w/OHP on airex x10  01/31/22 Calf stretches 4x20 Walk with SPC SBA 3x100 feet NuStep level 5 x 6 minutes Right leg HS curls 10# 3x6 Right only leg extension 5#  3x10 Leg press 20# both legs x 10, then right only 2x10 some guarding of the right knee Standing volley all with beach ball, ball kicks, going slow due to issues with right knee Gait with SPC outside uneven terrain, curb, CGA x 200 feet  01/25/22 Nustep L5x34mns Ankle rockers DF/PF Ankle redTB x10 DF/PF  Knee ext 15# 2x10  up w/BLE but down on RLE, 10# RLE  x10 Knee flexion 25# 2x10, 10# RLE x10   01/23/22 Nustep L5 x654ms STS with OHP yellow 2x5 Step ups on Airex in RW 2x5 each side Resisted gait 20# forwards and side steps x3 Standing hip abd/ext 5# 2x10     01/12/22 Nustep L5 x6m25m  Ankle ROM on disc Walking w/o AFO Side step ups 2HHA SL cone taps with RLE STS with OHP yellow ball x10  Resisted gait 20# forwards and side steps x3  01/09/22  Nustep L3 x6mi70mWalking without AD  Fitter 2x10  Step ups 6"  Knee ext 10# bilat R eccentric, 5# RLE only x10 HS curls 5# x10 RLE only   01/05/22 Bike L3 x6min32meassess BERG  Walking w/o AD in // bars w/AFO (forwards and side steps) Standing ankle ROM on rocker w/o AFO x10 DF/PF and IV/EV Heel taps on 6" 20reps Gastroc stretch seated w/strap 30s each  Stair training    01/03/22 Nustep L3 x5mins46might shifts in // bars 2 way hip 2# 2x10 Standing marches in // bars 2# 2x10  LAQ 2# 2x10 RLE  STS elevated mat 2x10    Patient's gait assessed without AFO, agree she doe snot need to use it when indoors, but recommend using it when out of the house for safety due to fatigue. BERG assessed-41/56 Practiced forward weight shifts over RLE without hyperextension at parallel bars, difficutly Wbing with R knee control.  PATIENT EDUCATION: Education details: PT POC, Eval results, initial HEP Person educated: Patient Education method: Explanation, Demonstration, and Handouts Education comprehension: verbalized understanding and returned demonstration   HOME EXERCISE PROGRAM: Access Code: [email or web address] Date: 12/13/2021 [facility]: [facility]  Exercises - Seated Heel Slide  - 1-2 x daily - 7 x weekly - 1 sets - 5-10 reps - 10-15 sec hold - Sit to Stand with Counter Support  - 1-2 x daily - 7 x weekly - 1-2 sets - 5 reps - Seated Gluteal Sets  - 1-2 x daily - 7 x weekly - 1-2  sets - 10 reps - 3 sec hold    GOALS: Goals reviewed with patient? Yes  SHORT TERM GOALS: Target date: 01/10/2022  Pt will be independent with HEP for improved strength, balance, gait. Baseline:  Initial HEP from [facility] Goal status: met  2.  Pt will improve 5x sit<>stand to less than or equal to 20 sec to demonstrate improved functional strength and transfer efficiency. Baseline: 29.13 sec with BUE support (scores >15 sec indicate fall risk), 18.71s no UE support Goal status: met  3.  Patient will score at least 50 or more on BERG to demonstrate improved balance and decreased risk for falls.. Baseline: 41, 44/56-8/16 Goal status: IN PROGRESS   LONG TERM GOALS: Target date: 02/07/2022  Pt will be independent with HEP for improved  strength, balance, transfers, and gait. Baseline:  Initial HEP from CIR Goal status: ongoing  2.  Pt will improve 5x sit<>stand to less than or equal to 15 sec to demonstrate improved functional strength and transfer efficiency. Baseline: 29.13 sec with BUE support (scores >15 sec indicate fall risk), 18.71s- 8/16 Goal status: IN PROGRESS  3.  Pt will improve TUG score to less than or equal to 25 sec for decreased fall risk. Baseline: 42.62 sec (indicates increased fall risk and difficulty with ADLs in home), 18.12s-8/16 Goal status: MET  4.  Pt will improve gait velocity to at least 1.8 ft/sec for improved gait efficiency and safety. Baseline: 1.37 ft/sec (1.31-2.62 indicates increased limited community ambulator), 1.01 ft/sec Goal status: MET   ASSESSMENT:  CLINICAL IMPRESSION: She is ambulating with SPC. Still slow and hesitant with backwards movements/steps and single leg tasks like step over obstacles. She does well stepping to the right but has difficulty moving to the left because of R foot placement. Has more movement in R ankle now so we did some TB exercises.   OBJECTIVE IMPAIRMENTS Abnormal gait, decreased balance, decreased mobility,  difficulty walking, decreased ROM, decreased strength, impaired flexibility, and impaired tone.   ACTIVITY LIMITATIONS standing, transfers, locomotion level, and caring for others  PARTICIPATION LIMITATIONS: meal prep, driving, shopping, community activity, and occupation  PERSONAL FACTORS 3+ comorbidities: see above for PMH  are also affecting patient's functional outcome.   REHAB POTENTIAL: Good  CLINICAL DECISION MAKING: Evolving/moderate complexity  EVALUATION COMPLEXITY: Moderate  PLAN: PT FREQUENCY: 2x/week  PT DURATION: 8 weeks plus eval  PLANNED INTERVENTIONS: Therapeutic exercises, Therapeutic activity, Neuromuscular re-education, Balance training, Gait training, Patient/Family education, Joint mobilization, Orthotic/Fit training, DME instructions, Aquatic Therapy, and Manual therapy  PLAN FOR NEXT SESSION: quad/hamstring strengthening, gait training w/o AFO and AD, balance on airex    Andris Baumann, PT 03/20/2022, 1:14 PM    [place] at [facility]

## 2022-03-20 ENCOUNTER — Ambulatory Visit: Payer: Medicaid Other

## 2022-03-20 DIAGNOSIS — R2681 Unsteadiness on feet: Secondary | ICD-10-CM

## 2022-03-20 DIAGNOSIS — R278 Other lack of coordination: Secondary | ICD-10-CM

## 2022-03-20 DIAGNOSIS — Z483 Aftercare following surgery for neoplasm: Secondary | ICD-10-CM

## 2022-03-20 DIAGNOSIS — R293 Abnormal posture: Secondary | ICD-10-CM

## 2022-03-20 DIAGNOSIS — R2689 Other abnormalities of gait and mobility: Secondary | ICD-10-CM

## 2022-03-20 DIAGNOSIS — M6281 Muscle weakness (generalized): Secondary | ICD-10-CM

## 2022-03-22 ENCOUNTER — Ambulatory Visit: Payer: Medicaid Other | Admitting: Family

## 2022-03-23 ENCOUNTER — Encounter: Payer: Self-pay | Admitting: Physical Therapy

## 2022-03-23 ENCOUNTER — Ambulatory Visit: Payer: Medicaid Other | Admitting: Physical Therapy

## 2022-03-23 DIAGNOSIS — M6281 Muscle weakness (generalized): Secondary | ICD-10-CM

## 2022-03-23 DIAGNOSIS — R2689 Other abnormalities of gait and mobility: Secondary | ICD-10-CM

## 2022-03-23 DIAGNOSIS — R2681 Unsteadiness on feet: Secondary | ICD-10-CM

## 2022-03-23 DIAGNOSIS — R293 Abnormal posture: Secondary | ICD-10-CM

## 2022-03-23 NOTE — Therapy (Signed)
OUTPATIENT PHYSICAL THERAPY NEURO TREATMENT   Patient Name: Sheryl Mcmillan MRN: [id number] DOB:[birth year], 46 y.o., female Today's Date: 03/23/2022   PCP: Windell Hummingbird, PA-C REFERRING PROVIDER: Marlowe Shores, PA-C (to f/u with Izora Ribas, MD)   PT End of Session - 03/23/22 1117     Visit Number 18    Date for PT Re-Evaluation 08/14/22    Authorization Type 6/10    PT Start Time 1057    PT Stop Time 1145    PT Time Calculation (min) 48 min    Activity Tolerance Patient tolerated treatment well    Behavior During Therapy [facility] for tasks assessed/performed                      Past Medical History:  Diagnosis Date   Arthritis    Breast cancer ([place])    Family history of non-Hodgkin's lymphoma 06/01/2020   GERD (gastroesophageal reflux disease)    Hemorrhoids    Migraines    PCOS (polycystic ovarian syndrome)    PONV (postoperative nausea and vomiting)    Past Surgical History:  Procedure Laterality Date   APPLICATION OF CRANIAL NAVIGATION Left 11/10/2021   Procedure: APPLICATION OF CRANIAL NAVIGATION;  Surgeon: Consuella Lose, MD;  Location: [place];  Service: Neurosurgery;  Laterality: Left;   AXILLARY SENTINEL NODE BIOPSY Left 11/07/2020   Procedure: LEFT AXILLARY SENTINEL NODE BIOPSY;  Surgeon: [name], MD;  Location: [place];  Service: General;  Laterality: Left;   BREAST CYST EXCISION Right 09/22/2021   Procedure: Excision of right axillary excess soft tissue.;  Surgeon: [name], MD;  Location: [place];  Service: Plastics;  Laterality: Right;   BREAST RECONSTRUCTION WITH PLACEMENT OF TISSUE EXPANDER AND FLEX HD (ACELLULAR HYDRATED DERMIS) Bilateral 11/07/2020   Procedure: BILATERAL BREAST RECONSTRUCTION WITH PLACEMENT OF TISSUE EXPANDER AND FLEX HD (ACELLULAR HYDRATED DERMIS);  Surgeon: [name], MD;  Location: [place];  Service: Plastics;  Laterality: Bilateral;   COSMETIC SURGERY     CRANIOTOMY Left 11/10/2021    Procedure: LT FRONTAL CRANIOTOMY TUMOR EXCISION;  Surgeon: Consuella Lose, MD;  Location: [place];  Service: Neurosurgery;  Laterality: Left;   DILATION AND CURETTAGE OF UTERUS     HEMORRHOID SURGERY     IR IMAGING GUIDED PORT INSERTION  06/15/2020   LIPOSUCTION WITH LIPOFILLING Bilateral 09/22/2021   Procedure: Fat grafting bilateral breast;  Surgeon: [name], MD;  Location: [place];  Service: Plastics;  Laterality: Bilateral;   MASS EXCISION Bilateral 04/25/2021   Procedure: excision of bilateral axillary breast tissue;  Surgeon: [name], MD;  Location: [place];  Service: Plastics;  Laterality: Bilateral;   PORT-A-CATH REMOVAL Right 11/07/2020   Procedure: REMOVAL PORT-A-CATH;  Surgeon: [name], MD;  Location: [place];  Service: General;  Laterality: Right;   REMOVAL OF BILATERAL TISSUE EXPANDERS WITH PLACEMENT OF BILATERAL BREAST IMPLANTS Bilateral 04/25/2021   Procedure: REMOVAL OF BILATERAL TISSUE EXPANDERS WITH PLACEMENT OF BILATERAL BREAST IMPLANTS;  Surgeon: [name], MD;  Location: [place];  Service: Plastics;  Laterality: Bilateral;   ROBOTIC ASSISTED TOTAL HYSTERECTOMY WITH BILATERAL SALPINGO OOPHERECTOMY Bilateral 03/14/2021   Procedure: XI ROBOTIC ASSISTED TOTAL HYSTERECTOMY WITH BILATERAL SALPINGO OOPHORECTOMY;  Surgeon: Everitt Amber, MD;  Location: [facility];  Service: Gynecology;  Laterality: Bilateral;   TOTAL MASTECTOMY Bilateral 11/07/2020   Procedure: BILATERAL MASTECTOMY;  Surgeon: [name], MD;  Location: [place];  Service: General;  Laterality: Bilateral;   Patient Active Problem List   Diagnosis Date Noted   Brain tumor ([place]) 11/08/2021   Metastatic cancer to brain ([place]) 11/08/2021   Cerebral edema ([place]) 11/08/2021   Complex partial seizure ([place]) 11/08/2021   Brain mass 11/07/2021   Right sided weakness 11/07/2021   IDA (iron deficiency anemia) 07/18/2021   Cholelithiasis 03/14/2021    Peripheral neuropathy due to chemotherapy ([place]) 10/20/2020   Morbid obesity with BMI of 40.0-44.9, adult ([place]) 07/19/2020   BRCA1 gene mutation positive 07/04/2020   Genetic testing 06/28/2020   Family history of non-Hodgkin's lymphoma 06/01/2020   Malignant neoplasm of upper-outer quadrant of left breast in female, estrogen receptor positive ([place]) 05/26/2020    ONSET DATE: 12/05/2021 (MD referral)  REFERRING DIAG: D49.6 (ICD-10-CM) - Neoplasm of unspecified behavior of brain  THERAPY DIAG:  Unsteadiness on feet  Other abnormalities of gait and mobility  Muscle weakness (generalized)  Abnormal posture  Rationale for Evaluation and Treatment Rehabilitation  SUBJECTIVE:                                                                                                                                                                                              SUBJECTIVE STATEMENT: Going to [place] tomorrow and then to [place], dread the long car ride, have some concerns about bathrooms  PERTINENT HISTORY: metastatic breast cancer, brain tumor s/p excision with craniotomy; GERD, morbid obesity.  Admitted to rehab ([facility]) 11/17/21 and d/c home 12/09/2021  PAIN:  Are you having pain? No  PRECAUTIONS: Fall and Other: wearing R GRAFO, no driving  FALLS: Has patient fallen in last 6 months? 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 Now  s/p L frontal crani tumor excision 5/19.  COGNITION: Overall cognitive status: Within functional limits for tasks assessed   SENSATION:  Just starting to get sensation back on bottom of R foot Light touch: WFL  COORDINATION: Decreased coordination RLE-needs assist of LLE or UEs to place RLE   EDEMA:  Noted in RLE-pt reports   MUSCLE TONE: RLE: Hypotonic   POSTURE: rounded shoulders  LOWER EXTREMITY ROM:     Passive  Right Eval Right 8/16  Hip flexion    Hip extension    Hip abduction    Hip adduction    Hip internal rotation    Hip external rotation    Knee flexion    Knee extension    Ankle dorsiflexion -10 from neutral neutral  Ankle plantarflexion    Ankle inversion    Ankle eversion     (Blank rows = not tested)  LOWER EXTREMITY MMT:    MMT Right Eval Left Eval Right 02/07/22 Left  02/07/22  Hip flexion 3-/5 4/5 3+ 4  Hip extension      Hip abduction 3+/5 4/5 4 4   Hip adduction 3+/5 4/5 4 4   Hip internal rotation      Hip external rotation      Knee flexion 3-/5 4+/5 3+ 5  Knee extension 3-/5 4+/5 3+ 5  Ankle dorsiflexion trace 5/5 3+ 5  Ankle plantarflexion      Ankle inversion      Ankle eversion      (Blank rows = not tested)   TRANSFERS: Assistive device utilized: Environmental consultant - 2 wheeled  Sit to stand: SBA Stand to sit: SBA  GAIT: Gait pattern: step through pattern, decreased step length- Left, decreased stance time- Right, decreased ankle dorsiflexion- Right, and genu recurvatum- Right Distance walked: 60 ft  Assistive device utilized: Walker - 2 wheeled and R AFO Level of assistance: SBA Comments: Pt is able to don R AFO  FUNCTIONAL TESTs:  5 times sit to stand: 29.13 Timed up and go (TUG): 42.62 Gait velocity:  23.87 sec = 1.37 ft/sec 5x sit<>stand performed with UE support  PATIENT SURVEYS:  FOTO N/T  TREATMENT 03/23/22 Gait outside around the back building with [facility] but did not use it most of the time, gait in the grass and uneven  surfaces with CGA at times Practiced low toilet transfers Practice in and out of shower if she has to step over a tub side Passive stretch of the LE's and trunk Patient asked questions about transfers and we talked and she feels good about this  03/20/22 NuStep L6 x25mns  AnkleTB red x10 4 way Side steps over obstacles [place] ball kicks x10 each leg Walking on beam forwards and side steps  Standing on airex cone taps in // bars     03/13/22 Walking outside w/SPC  Nustep L5 x649ms  Step ups 6" x10 each side  Calf raises 2x12 4 way step over 3x clockwise, 3x counterclockwise  STS on airex w/OHP x10 Standing on airex rotations w/yellow ball 2x5  02/22/22 Nustep L5 x6m71m Treadmill 3%, 1mp68m mins  Cone taps standing on airex w/SPC x10  Lateral steps on airex x10 w/SPC  Lateral band walks greenTB   02/20/22 Walking outside w/cane small loop and on grass  Nustep L5 x6min31mWalking on black mat  with and w/o AD forwards and backwards Side steps on black mat  STS on black mat 4x5 Shoulder flexion 3# WaTE on folded mat x10  02/15/22 Nustep L5 x6mins52mtep overs 2" both sides Standing on airex hitting ball  Marching on airex 1HHA  Leg press 20# 2x10, RLE eccentric x10, RLE 20# x10 Calf raises on leg press 20# 2x12      Patient's gait assessed without AFO, agree she doe snot need to use it when indoors, but recommend using it when out of the house for safety due to  fatigue. BERG assessed-41/56 Practiced forward weight shifts over RLE without hyperextension at parallel bars, difficutly Wbing with R knee control.  PATIENT EDUCATION: Education details: PT POC, Eval results, initial HEP Person educated: Patient Education method: Explanation, Demonstration, and Handouts Education comprehension: verbalized understanding and returned demonstration   HOME EXERCISE PROGRAM: Access Code: [email or web address] URL: [email or web address] Date: 12/13/2021 Prepared by: [facility]  Exercises - Seated Heel Slide  - 1-2 x daily - 7 x weekly - 1 sets - 5-10 reps - 10-15 sec hold - Sit to Stand with Counter Support  - 1-2 x daily - 7 x weekly - 1-2 sets - 5 reps - Seated Gluteal Sets  - 1-2 x daily - 7 x weekly - 1-2 sets - 10 reps - 3 sec hold    GOALS: Goals reviewed with patient? Yes  SHORT TERM GOALS: Target date: 01/10/2022  Pt will be independent with HEP for improved strength, balance, gait. Baseline:  Initial HEP from [facility] Goal status: met  2.  Pt will improve 5x sit<>stand to less than or equal to 20 sec to demonstrate improved functional strength and transfer efficiency. Baseline: 29.13 sec with BUE support (scores >15 sec indicate fall risk), 18.71s no UE support Goal status: met  3.  Patient will score at least 50 or more on BERG to demonstrate improved balance and decreased risk for falls.. Baseline: 41, 44/56-8/16 Goal status: IN PROGRESS   LONG TERM GOALS: Target date: 02/07/2022  Pt will be independent with HEP for improved strength, balance, transfers, and gait. Baseline:  Initial HEP from [facility] Goal status: ongoing  2.  Pt will improve 5x sit<>stand to less than or equal to 15 sec to demonstrate improved functional strength and transfer efficiency. Baseline: 29.13 sec with BUE support (scores >15 sec indicate fall risk), 18.71s- 8/16 Goal status: IN PROGRESS  3.  Pt will improve TUG score to less than or equal to 25 sec for decreased fall risk. Baseline: 42.62 sec (indicates increased fall risk and difficulty with ADLs in home), 18.12s-8/16 Goal status: MET  4.  Pt will improve gait velocity to at least 1.8 ft/sec for improved gait efficiency and safety. Baseline: 1.37 ft/sec (1.31-2.62 indicates increased limited community ambulator), 1.01 ft/sec Goal status: MET   ASSESSMENT:  CLINICAL IMPRESSION: Patient going on a trip next week, she had some questions about safety that I feel like we  helped resolve.  She did great with the longer walks, had some issues stepping over objects to simulate a bathrub as she cannot get the right foot up and over.  OBJECTIVE IMPAIRMENTS Abnormal gait, decreased balance, decreased mobility, difficulty walking, decreased ROM, decreased strength, impaired flexibility, and impaired tone.   ACTIVITY LIMITATIONS standing, transfers, locomotion level, and caring for others  PARTICIPATION LIMITATIONS: meal prep, driving, shopping, community activity, and occupation  PERSONAL FACTORS 3+ comorbidities: see above for PMH  are also affecting patient's functional outcome.   REHAB POTENTIAL: Good  CLINICAL DECISION MAKING: Evolving/moderate complexity  EVALUATION COMPLEXITY: Moderate  PLAN: PT FREQUENCY: 2x/week  PT DURATION: 8 weeks plus eval  PLANNED INTERVENTIONS: Therapeutic exercises, Therapeutic activity, Neuromuscular re-education, Balance training, Gait training, Patient/Family education, Joint mobilization, Orthotic/Fit training, DME instructions, Aquatic Therapy, and Manual therapy  PLAN FOR NEXT SESSION:   see how her vacation goes and progress as tolerated   Lum Babe, PT 03/23/2022, 11:43 AM    [place] at [facility]

## 2022-04-03 NOTE — Therapy (Signed)
OUTPATIENT PHYSICAL THERAPY NEURO TREATMENT   Patient Name: Sheryl Mcmillan MRN: [id number] DOB:November 25, 1975, 46 y.o., female Today's Date: 04/04/2022   PCP: Windell Hummingbird, PA-C REFERRING PROVIDER: Marlowe Shores, PA-C (to f/u with Izora Ribas, MD)   PT End of Session - 04/04/22 1104     Visit Number 19    Date for PT Re-Evaluation 08/14/22    Authorization Type 6/10    PT Start Time 1100    PT Stop Time 1145    PT Time Calculation (min) 45 min    Activity Tolerance Patient tolerated treatment well    Behavior During Therapy [facility] for tasks assessed/performed                       Past Medical History:  Diagnosis Date   Arthritis    Breast cancer ([place])    Family history of non-Hodgkin's lymphoma 06/01/2020   GERD (gastroesophageal reflux disease)    Hemorrhoids    Migraines    PCOS (polycystic ovarian syndrome)    PONV (postoperative nausea and vomiting)    Past Surgical History:  Procedure Laterality Date   APPLICATION OF CRANIAL NAVIGATION Left 11/10/2021   Procedure: APPLICATION OF CRANIAL NAVIGATION;  Surgeon: Consuella Lose, MD;  Location: [place];  Service: Neurosurgery;  Laterality: Left;   AXILLARY SENTINEL NODE BIOPSY Left 11/07/2020   Procedure: LEFT AXILLARY SENTINEL NODE BIOPSY;  Surgeon: [name], MD;  Location: [place];  Service: General;  Laterality: Left;   BREAST CYST EXCISION Right 09/22/2021   Procedure: Excision of right axillary excess soft tissue.;  Surgeon: [name], MD;  Location: [place];  Service: Plastics;  Laterality: Right;   BREAST RECONSTRUCTION WITH PLACEMENT OF TISSUE EXPANDER AND FLEX HD (ACELLULAR HYDRATED DERMIS) Bilateral 11/07/2020   Procedure: BILATERAL BREAST RECONSTRUCTION WITH PLACEMENT OF TISSUE EXPANDER AND FLEX HD (ACELLULAR HYDRATED DERMIS);  Surgeon: [name], MD;  Location: [place];  Service: Plastics;  Laterality: Bilateral;   COSMETIC SURGERY     CRANIOTOMY Left 11/10/2021    Procedure: LT FRONTAL CRANIOTOMY TUMOR EXCISION;  Surgeon: Consuella Lose, MD;  Location: [place];  Service: Neurosurgery;  Laterality: Left;   DILATION AND CURETTAGE OF UTERUS     HEMORRHOID SURGERY     IR IMAGING GUIDED PORT INSERTION  06/15/2020   LIPOSUCTION WITH LIPOFILLING Bilateral 09/22/2021   Procedure: Fat grafting bilateral breast;  Surgeon: [name], MD;  Location: [place];  Service: Plastics;  Laterality: Bilateral;   MASS EXCISION Bilateral 04/25/2021   Procedure: excision of bilateral axillary breast tissue;  Surgeon: [name], MD;  Location: [place];  Service: Plastics;  Laterality: Bilateral;   PORT-A-CATH REMOVAL Right 11/07/2020   Procedure: REMOVAL PORT-A-CATH;  Surgeon: [name], MD;  Location: [place];  Service: General;  Laterality: Right;   REMOVAL OF BILATERAL TISSUE EXPANDERS WITH PLACEMENT OF BILATERAL BREAST IMPLANTS Bilateral 04/25/2021   Procedure: REMOVAL OF BILATERAL TISSUE EXPANDERS WITH PLACEMENT OF BILATERAL BREAST IMPLANTS;  Surgeon: [name], MD;  Location: [place];  Service: Plastics;  Laterality: Bilateral;   ROBOTIC ASSISTED TOTAL HYSTERECTOMY WITH BILATERAL SALPINGO OOPHERECTOMY Bilateral 03/14/2021   Procedure: XI ROBOTIC ASSISTED TOTAL HYSTERECTOMY WITH BILATERAL SALPINGO OOPHORECTOMY;  Surgeon: Everitt Amber, MD;  Location: [facility];  Service: Gynecology;  Laterality: Bilateral;   TOTAL MASTECTOMY Bilateral 11/07/2020   Procedure: BILATERAL MASTECTOMY;  Surgeon: [name], MD;  Location: [place];  Service:  General;  Laterality: Bilateral;   Patient Active Problem List   Diagnosis Date Noted   Brain tumor ([place]) 11/08/2021   Metastatic cancer to brain ([place]) 11/08/2021   Cerebral edema ([place]) 11/08/2021   Complex partial seizure ([place]) 11/08/2021   Brain mass 11/07/2021   Right sided weakness 11/07/2021   IDA (iron deficiency anemia) 07/18/2021   Cholelithiasis  03/14/2021   Peripheral neuropathy due to chemotherapy ([place]) 10/20/2020   Morbid obesity with BMI of 40.0-44.9, adult ([place]) 07/19/2020   BRCA1 gene mutation positive 07/04/2020   Genetic testing 06/28/2020   Family history of non-Hodgkin's lymphoma 06/01/2020   Malignant neoplasm of upper-outer quadrant of left breast in female, estrogen receptor positive ([place]) 05/26/2020    ONSET DATE: 12/05/2021 (MD referral)  REFERRING DIAG: D49.6 (ICD-10-CM) - Neoplasm of unspecified behavior of brain  THERAPY DIAG:  Unsteadiness on feet  Other abnormalities of gait and mobility  Muscle weakness (generalized)  Aftercare following surgery for neoplasm  Other lack of coordination  Rationale for Evaluation and Treatment Rehabilitation  SUBJECTIVE:                                                                                                                                                                                              SUBJECTIVE STATEMENT:   PERTINENT HISTORY: metastatic breast cancer, brain tumor s/p excision with craniotomy; GERD, morbid obesity.  Admitted to rehab ([facility]) 11/17/21 and d/c home 12/09/2021  PAIN:  Are you having pain? No  PRECAUTIONS: Fall and Other: wearing R GRAFO, no driving  FALLS: Has patient fallen in last 6 months? 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 Now s/p L frontal crani tumor excision  5/19.  COGNITION: Overall cognitive status: Within functional limits for tasks assessed   SENSATION:  Just starting to get sensation back on bottom of R foot Light touch: WFL  COORDINATION: Decreased coordination RLE-needs assist of LLE or UEs to place RLE   EDEMA:  Noted in RLE-pt reports   MUSCLE TONE: RLE: Hypotonic   POSTURE: rounded shoulders  LOWER EXTREMITY ROM:  Passive  Right Eval Right 8/16  Hip flexion    Hip extension    Hip abduction    Hip adduction    Hip internal rotation    Hip external rotation    Knee flexion    Knee extension    Ankle dorsiflexion -10 from neutral neutral  Ankle plantarflexion    Ankle inversion    Ankle eversion     (Blank rows = not tested)  LOWER EXTREMITY MMT:    MMT Right Eval Left Eval Right 02/07/22 Left  02/07/22  Hip flexion 3-/5 4/5 3+ 4  Hip extension      Hip abduction 3+/5 4/5 4 4   Hip adduction 3+/5 4/5 4 4   Hip internal rotation      Hip external rotation      Knee flexion 3-/5 4+/5 3+ 5  Knee extension 3-/5 4+/5 3+ 5  Ankle dorsiflexion trace 5/5 3+ 5  Ankle plantarflexion      Ankle inversion      Ankle eversion      (Blank rows = not tested)   TRANSFERS: Assistive device utilized: Environmental consultant - 2 wheeled  Sit to stand: SBA Stand to sit: SBA  GAIT: Gait pattern: step through pattern, decreased step length- Left, decreased stance time- Right, decreased ankle dorsiflexion- Right, and genu recurvatum- Right Distance walked: 60 ft  Assistive device utilized: Walker - 2 wheeled and R AFO Level of assistance: SBA Comments: Pt is able to don R AFO  FUNCTIONAL TESTs:  5 times sit to stand: 29.13 Timed up and go (TUG): 42.62 Gait velocity:  23.87 sec = 1.37 ft/sec 5x sit<>stand performed with UE support  PATIENT SURVEYS:  FOTO N/T  TREATMENT 04/04/22 STS on airex 2x10 Resisted gait 30# 4 way x4  Standing cone taps with side step  Standing on airex hitting ball Gastroc stretch in slant 30s     03/23/22 Gait outside around the back building with [facility] but did not use it most of the time, gait in the grass and uneven surfaces with CGA at times Practiced low toilet transfers Practice in and out of shower if she has to step over a tub side Passive stretch of the LE's and trunk Patient asked questions about transfers and we talked and she feels good about this  03/20/22 NuStep L6 x71mns  AnkleTB red x10 4 way Side steps over obstacles Beach ball kicks x10 each leg Walking on beam forwards and side steps  Standing on airex cone taps in // bars   03/13/22 Walking outside w/SPC  Nustep L5 x665ms  Step ups 6" x10 each side  Calf raises 2x12 4 way step over 3x clockwise, 3x counterclockwise  STS on airex w/OHP x10 Standing on airex rotations w/yellow ball 2x5  02/22/22 Nustep L5 x6m103m Treadmill 3%, 1mp21m mins  Cone taps standing on airex w/SPC x10  Lateral steps on airex x10 w/SPC  Lateral band walks greenTB   02/20/22 Walking outside w/cane small loop and on grass  Nustep L5 x6min61mWalking on black mat  with and w/o AD forwards and backwards Side steps on black mat  STS on black mat 4x5 Shoulder flexion 3# WaTE on folded mat x10  02/15/22 Nustep L5 x6mins6mtep overs 2" both sides Standing on airex hitting ball  Marching on airex 1HHA  Leg press 20# 2x10, RLE eccentric x10, RLE 20# x10 Calf raises on leg press 20# 2x12      Patient's gait assessed without AFO,  agree she doe snot need to use it when indoors, but recommend using it when out of the house for safety due to fatigue. BERG assessed-41/56 Practiced forward weight shifts over RLE without hyperextension at parallel bars, difficutly Wbing with R knee control.  PATIENT EDUCATION: Education details: PT POC, Eval results, initial HEP Person educated: Patient Education method: Explanation, Demonstration, and Handouts Education comprehension: verbalized understanding and returned demonstration   HOME  EXERCISE PROGRAM: Access Code: [email or web address] URL: [email or web address] Date: 12/13/2021 Prepared by: [facility]  Exercises - Seated Heel Slide  - 1-2 x daily - 7 x weekly - 1 sets - 5-10 reps - 10-15 sec hold - Sit to Stand with Counter Support  - 1-2 x daily - 7 x weekly - 1-2 sets - 5 reps - Seated Gluteal Sets  - 1-2 x daily - 7 x weekly - 1-2 sets - 10 reps - 3 sec hold    GOALS: Goals reviewed with patient? Yes  SHORT TERM GOALS: Target date: 01/10/2022  Pt will be independent with HEP for improved strength, balance, gait. Baseline:  Initial HEP from CIR Goal status: met  2.  Pt will improve 5x sit<>stand to less than or equal to 20 sec to demonstrate improved functional strength and transfer efficiency. Baseline: 29.13 sec with BUE support (scores >15 sec indicate fall risk), 18.71s no UE support Goal status: met  3.  Patient will score at least 50 or more on BERG to demonstrate improved balance and decreased risk for falls.. Baseline: 41, 44/56-8/16 Goal status: IN PROGRESS   LONG TERM GOALS: Target date: 02/07/2022  Pt will be independent with HEP for improved strength, balance, transfers, and gait. Baseline:  Initial HEP from CIR Goal status: ongoing  2.  Pt will improve 5x sit<>stand to less than or equal to 15 sec to demonstrate improved functional strength and transfer efficiency. Baseline: 29.13 sec with BUE support (scores >15 sec indicate fall risk), 18.71s- 8/16 Goal status: IN PROGRESS  3.  Pt will improve TUG score to less than or equal to 25 sec for decreased fall risk. Baseline: 42.62 sec (indicates increased fall risk and difficulty with ADLs in home), 18.12s-8/16 Goal status: MET  4.  Pt will improve gait velocity to at least 1.8 ft/sec for improved gait efficiency and safety. Baseline: 1.37 ft/sec (1.31-2.62 indicates increased limited community ambulator), 1.01 ft/sec Goal status:  MET   ASSESSMENT:  CLINICAL IMPRESSION: Patient returned from vacation, she handled it well did not have any falls or trips. We continued working on R foot placement as she still struggle with this. Does well with balance on airex today.   OBJECTIVE IMPAIRMENTS Abnormal gait, decreased balance, decreased mobility, difficulty walking, decreased ROM, decreased strength, impaired flexibility, and impaired tone.   ACTIVITY LIMITATIONS standing, transfers, locomotion level, and caring for others  PARTICIPATION LIMITATIONS: meal prep, driving, shopping, community activity, and occupation  PERSONAL FACTORS 3+ comorbidities: see above for PMH  are also affecting patient's functional outcome.   REHAB POTENTIAL: Good  CLINICAL DECISION MAKING: Evolving/moderate complexity  EVALUATION COMPLEXITY: Moderate  PLAN: PT FREQUENCY: 2x/week  PT DURATION: 8 weeks plus eval  PLANNED INTERVENTIONS: Therapeutic exercises, Therapeutic activity, Neuromuscular re-education, Balance training, Gait training, Patient/Family education, Joint mobilization, Orthotic/Fit training, DME instructions, Aquatic Therapy, and Manual therapy  PLAN FOR NEXT SESSION:   see how her vacation goes and progress as tolerated   Lum Babe, PT 04/04/2022, 11:41 AM    [place]  [facility] at [facility]

## 2022-04-04 ENCOUNTER — Ambulatory Visit: Payer: Medicaid Other | Attending: Physician Assistant

## 2022-04-04 DIAGNOSIS — R2681 Unsteadiness on feet: Secondary | ICD-10-CM

## 2022-04-04 DIAGNOSIS — M6281 Muscle weakness (generalized): Secondary | ICD-10-CM

## 2022-04-04 DIAGNOSIS — R2689 Other abnormalities of gait and mobility: Secondary | ICD-10-CM

## 2022-04-04 DIAGNOSIS — R278 Other lack of coordination: Secondary | ICD-10-CM

## 2022-04-04 DIAGNOSIS — R293 Abnormal posture: Secondary | ICD-10-CM | POA: Insufficient documentation

## 2022-04-04 DIAGNOSIS — Z483 Aftercare following surgery for neoplasm: Secondary | ICD-10-CM | POA: Diagnosis present

## 2022-04-06 ENCOUNTER — Ambulatory Visit: Payer: Medicaid Other

## 2022-04-06 NOTE — Therapy (Signed)
OUTPATIENT PHYSICAL THERAPY NEURO TREATMENT   Patient Name: Sheryl Mcmillan MRN: [id number] DOB:05/21/1976, 46 y.o., female Today's Date: 04/06/2022   PCP: Windell Hummingbird, PA-C REFERRING PROVIDER: Marlowe Shores, PA-C (to f/u with Izora Ribas, MD)               Past Medical History:  Diagnosis Date   Arthritis    Breast cancer [facility])    Family history of non-Hodgkin's lymphoma 06/01/2020   GERD (gastroesophageal reflux disease)    Hemorrhoids    Migraines    PCOS (polycystic ovarian syndrome)    PONV (postoperative nausea and vomiting)    Past Surgical History:  Procedure Laterality Date   APPLICATION OF CRANIAL NAVIGATION Left 11/10/2021   Procedure: APPLICATION OF CRANIAL NAVIGATION;  Surgeon: Consuella Lose, MD;  Location: [place];  Service: Neurosurgery;  Laterality: Left;   AXILLARY SENTINEL NODE BIOPSY Left 11/07/2020   Procedure: LEFT AXILLARY SENTINEL NODE BIOPSY;  Surgeon: [name], MD;  Location: [place];  Service: General;  Laterality: Left;   BREAST CYST EXCISION Right 09/22/2021   Procedure: Excision of right axillary excess soft tissue.;  Surgeon: [name], MD;  Location: [place];  Service: Plastics;  Laterality: Right;   BREAST RECONSTRUCTION WITH PLACEMENT OF TISSUE EXPANDER AND FLEX HD (ACELLULAR HYDRATED DERMIS) Bilateral 11/07/2020   Procedure: BILATERAL BREAST RECONSTRUCTION WITH PLACEMENT OF TISSUE EXPANDER AND FLEX HD (ACELLULAR HYDRATED DERMIS);  Surgeon: [name], MD;  Location: [place];  Service: Plastics;  Laterality: Bilateral;   COSMETIC SURGERY     CRANIOTOMY Left 11/10/2021   Procedure: LT FRONTAL CRANIOTOMY TUMOR EXCISION;  Surgeon: Consuella Lose, MD;  Location: [place];  Service: Neurosurgery;  Laterality: Left;   DILATION AND CURETTAGE OF UTERUS     HEMORRHOID SURGERY     IR IMAGING GUIDED PORT INSERTION  06/15/2020   LIPOSUCTION WITH LIPOFILLING Bilateral 09/22/2021   Procedure: Fat grafting  bilateral breast;  Surgeon: [name], MD;  Location: [place];  Service: Plastics;  Laterality: Bilateral;   MASS EXCISION Bilateral 04/25/2021   Procedure: excision of bilateral axillary breast tissue;  Surgeon: [name], MD;  Location: [place];  Service: Plastics;  Laterality: Bilateral;   PORT-A-CATH REMOVAL Right 11/07/2020   Procedure: REMOVAL PORT-A-CATH;  Surgeon: [name], MD;  Location: [place];  Service: General;  Laterality: Right;   REMOVAL OF BILATERAL TISSUE EXPANDERS WITH PLACEMENT OF BILATERAL BREAST IMPLANTS Bilateral 04/25/2021   Procedure: REMOVAL OF BILATERAL TISSUE EXPANDERS WITH PLACEMENT OF BILATERAL BREAST IMPLANTS;  Surgeon: [name], MD;  Location: [place];  Service: Plastics;  Laterality: Bilateral;   ROBOTIC ASSISTED TOTAL HYSTERECTOMY WITH BILATERAL SALPINGO OOPHERECTOMY Bilateral 03/14/2021   Procedure: XI ROBOTIC ASSISTED TOTAL HYSTERECTOMY WITH BILATERAL SALPINGO OOPHORECTOMY;  Surgeon: Everitt Amber, MD;  Location: [facility];  Service: Gynecology;  Laterality: Bilateral;   TOTAL MASTECTOMY Bilateral 11/07/2020   Procedure: BILATERAL MASTECTOMY;  Surgeon: [name], MD;  Location: [place];  Service: General;  Laterality: Bilateral;   Patient Active Problem List   Diagnosis Date Noted   Brain tumor ([place]) 11/08/2021   Metastatic cancer to brain ([place]) 11/08/2021   Cerebral edema ([place]) 11/08/2021   Complex partial seizure ([place]) 11/08/2021   Brain mass 11/07/2021   Right sided weakness 11/07/2021   IDA (iron deficiency anemia) 07/18/2021   Cholelithiasis 03/14/2021   Peripheral neuropathy due to chemotherapy ([place]) 10/20/2020   Morbid obesity with BMI of  40.0-44.9, adult (HCC) 07/19/2020   BRCA1 gene mutation positive 07/04/2020   Genetic testing 06/28/2020   Family history of non-Hodgkin's lymphoma 06/01/2020   Malignant neoplasm of upper-outer quadrant of left breast in female,  estrogen receptor positive (HCC) 05/26/2020    ONSET DATE: 12/05/2021 (MD referral)  REFERRING DIAG: D49.6 (ICD-10-CM) - Neoplasm of unspecified behavior of brain  THERAPY DIAG:  No diagnosis found.  Rationale for Evaluation and Treatment Rehabilitation  SUBJECTIVE:                                                                                                                                                                                              SUBJECTIVE STATEMENT:   PERTINENT HISTORY: metastatic breast cancer, brain tumor s/p excision with craniotomy; GERD, morbid obesity.  Admitted to rehab (CIR) 11/17/21 and d/c home 12/09/2021  PAIN:  Are you having pain? No  PRECAUTIONS: Fall and Other: wearing R GRAFO, no driving  FALLS: Has patient fallen in last 6 months? 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 Now s/p L frontal crani tumor excision 5/19.  COGNITION: Overall cognitive status: Within functional limits for tasks assessed   SENSATION:  Just starting to get sensation back on bottom of R foot Light touch: WFL  COORDINATION: Decreased coordination RLE-needs assist of LLE or UEs to place RLE   EDEMA:  Noted in RLE-pt reports   MUSCLE TONE: RLE: Hypotonic   POSTURE: rounded shoulders  LOWER EXTREMITY ROM:     Passive  Right Eval Right 8/16  Hip flexion    Hip extension    Hip abduction    Hip  adduction    Hip internal rotation    Hip external rotation    Knee flexion    Knee extension    Ankle dorsiflexion -10 from neutral neutral  Ankle plantarflexion    Ankle inversion    Ankle eversion     (Blank rows = not tested)  LOWER EXTREMITY MMT:    MMT Right Eval Left Eval Right 02/07/22 Left  02/07/22  Hip flexion 3-/5 4/5 3+ 4    Hip extension      Hip abduction 3+/5 4/5 4 4  Hip adduction 3+/5 4/5 4 4  Hip internal rotation      Hip external rotation      Knee flexion 3-/5 4+/5 3+ 5  Knee extension 3-/5 4+/5 3+ 5  Ankle dorsiflexion trace 5/5 3+ 5  Ankle plantarflexion      Ankle inversion      Ankle eversion      (Blank rows = not tested)   TRANSFERS: Assistive device utilized: Walker - 2 wheeled  Sit to stand: SBA Stand to sit: SBA  GAIT: Gait pattern: step through pattern, decreased step length- Left, decreased stance time- Right, decreased ankle dorsiflexion- Right, and genu recurvatum- Right Distance walked: 60 ft  Assistive device utilized: Walker - 2 wheeled and R AFO Level of assistance: SBA Comments: Pt is able to don R AFO  FUNCTIONAL TESTs:  5 times sit to stand: 29.13 Timed up and go (TUG): 42.62 Gait velocity:  23.87 sec = 1.37 ft/sec 5x sit<>stand performed with UE support  PATIENT SURVEYS:  FOTO N/T  TREATMENT 04/06/22 NuStep Redo BERG and 5xSTS Ladder drill one foot in each box and then side steps  One foot in the box and out each box Feet together on airex catch in // bars   04/04/22 STS on airex 2x10 Resisted gait 30# 4 way x4  Standing cone taps with side step  Standing on airex hitting ball Gastroc stretch in slant 30s    03/23/22 Gait outside around the back building with SPC but did not use it most of the time, gait in the grass and uneven surfaces with CGA at times Practiced low toilet transfers Practice in and out of shower if she has to step over a tub side Passive stretch of the LE's and trunk Patient asked  questions about transfers and we talked and she feels good about this  03/20/22 NuStep L6 x6mins  AnkleTB red x10 4 way Side steps over obstacles Beach ball kicks x10 each leg Walking on beam forwards and side steps  Standing on airex cone taps in // bars   03/13/22 Walking outside w/SPC  Nustep L5 x6mins  Step ups 6" x10 each side  Calf raises 2x12 4 way step over 3x clockwise, 3x counterclockwise  STS on airex w/OHP x10 Standing on airex rotations w/yellow ball 2x5  02/22/22 Nustep L5 x6mins Treadmill 3%, 1mph 5 mins  Cone taps standing on airex w/SPC x10  Lateral steps on airex x10 w/SPC  Lateral band walks greenTB   02/20/22 Walking outside w/cane small loop and on grass  Nustep L5 x6mins  Walking on black mat  with and w/o AD forwards and backwards Side steps on black mat  STS on black mat 4x5 Shoulder flexion 3# WaTE on folded mat x10  02/15/22 Nustep L5 x6mins  Step overs 2" both sides Standing on airex hitting ball  Marching on airex 1HHA  Leg press 20# 2x10, RLE eccentric x10, RLE 20# x10 Calf raises on leg press 20# 2x12      Patient's gait assessed without AFO, agree she doe snot need to use it when indoors, but recommend using it when out of the house for safety due to fatigue. BERG assessed-41/56 Practiced forward weight shifts over RLE without hyperextension at parallel bars, difficutly Wbing with R knee control.  PATIENT EDUCATION: Education details: PT POC, Eval results, initial HEP Person educated: Patient Education method: Explanation, Demonstration, and Handouts Education comprehension:   verbalized understanding and returned demonstration   HOME EXERCISE PROGRAM: Access Code: [email or web address] URL: [email or web address] Date: 12/13/2021 Prepared by: [facility]  Exercises - Seated Heel Slide  - 1-2 x daily - 7 x weekly - 1 sets - 5-10 reps - 10-15 sec hold - Sit to Stand with Counter Support  - 1-2  x daily - 7 x weekly - 1-2 sets - 5 reps - Seated Gluteal Sets  - 1-2 x daily - 7 x weekly - 1-2 sets - 10 reps - 3 sec hold    GOALS: Goals reviewed with patient? Yes  SHORT TERM GOALS: Target date: 01/10/2022  Pt will be independent with HEP for improved strength, balance, gait. Baseline:  Initial HEP from CIR Goal status: met  2.  Pt will improve 5x sit<>stand to less than or equal to 20 sec to demonstrate improved functional strength and transfer efficiency. Baseline: 29.13 sec with BUE support (scores >15 sec indicate fall risk), 18.71s no UE support Goal status: met  3.  Patient will score at least 50 or more on BERG to demonstrate improved balance and decreased risk for falls.. Baseline: 41, 44/56-8/16 Goal status: IN PROGRESS   LONG TERM GOALS: Target date: 02/07/2022  Pt will be independent with HEP for improved strength, balance, transfers, and gait. Baseline:  Initial HEP from CIR Goal status: ongoing  2.  Pt will improve 5x sit<>stand to less than or equal to 15 sec to demonstrate improved functional strength and transfer efficiency. Baseline: 29.13 sec with BUE support (scores >15 sec indicate fall risk), 18.71s- 8/16 Goal status: IN PROGRESS  3.  Pt will improve TUG score to less than or equal to 25 sec for decreased fall risk. Baseline: 42.62 sec (indicates increased fall risk and difficulty with ADLs in home), 18.12s-8/16 Goal status: MET  4.  Pt will improve gait velocity to at least 1.8 ft/sec for improved gait efficiency and safety. Baseline: 1.37 ft/sec (1.31-2.62 indicates increased limited community ambulator), 1.01 ft/sec Goal status: MET   ASSESSMENT:  CLINICAL IMPRESSION: Patient returned from vacation, she handled it well did not have any falls or trips. We continued working on R foot placement as she still struggle with this. Does well with balance on airex today.   OBJECTIVE IMPAIRMENTS Abnormal gait, decreased balance, decreased mobility,  difficulty walking, decreased ROM, decreased strength, impaired flexibility, and impaired tone.   ACTIVITY LIMITATIONS standing, transfers, locomotion level, and caring for others  PARTICIPATION LIMITATIONS: meal prep, driving, shopping, community activity, and occupation  PERSONAL FACTORS 3+ comorbidities: see above for PMH  are also affecting patient's functional outcome.   REHAB POTENTIAL: Good  CLINICAL DECISION MAKING: Evolving/moderate complexity  EVALUATION COMPLEXITY: Moderate  PLAN: PT FREQUENCY: 2x/week  PT DURATION: 8 weeks plus eval  PLANNED INTERVENTIONS: Therapeutic exercises, Therapeutic activity, Neuromuscular re-education, Balance training, Gait training, Patient/Family education, Joint mobilization, Orthotic/Fit training, DME instructions, Aquatic Therapy, and Manual therapy  PLAN FOR NEXT SESSION:   see how her vacation goes and progress as tolerated   Lum Babe, PT 04/06/2022, 11:14 AM    [place] at [facility]

## 2022-04-09 ENCOUNTER — Ambulatory Visit: Payer: Medicaid Other

## 2022-04-09 DIAGNOSIS — M6281 Muscle weakness (generalized): Secondary | ICD-10-CM

## 2022-04-09 DIAGNOSIS — R2689 Other abnormalities of gait and mobility: Secondary | ICD-10-CM

## 2022-04-09 DIAGNOSIS — R2681 Unsteadiness on feet: Secondary | ICD-10-CM

## 2022-04-09 DIAGNOSIS — R278 Other lack of coordination: Secondary | ICD-10-CM

## 2022-04-09 DIAGNOSIS — Z483 Aftercare following surgery for neoplasm: Secondary | ICD-10-CM

## 2022-04-11 ENCOUNTER — Encounter: Payer: Self-pay | Admitting: Physical Therapy

## 2022-04-11 ENCOUNTER — Ambulatory Visit: Payer: Medicaid Other | Admitting: Physical Therapy

## 2022-04-11 DIAGNOSIS — R2681 Unsteadiness on feet: Secondary | ICD-10-CM

## 2022-04-11 DIAGNOSIS — R293 Abnormal posture: Secondary | ICD-10-CM

## 2022-04-11 DIAGNOSIS — R278 Other lack of coordination: Secondary | ICD-10-CM

## 2022-04-11 DIAGNOSIS — M6281 Muscle weakness (generalized): Secondary | ICD-10-CM

## 2022-04-11 DIAGNOSIS — R2689 Other abnormalities of gait and mobility: Secondary | ICD-10-CM

## 2022-04-11 NOTE — Therapy (Signed)
OUTPATIENT PHYSICAL THERAPY NEURO TREATMENT   Patient Name: Sheryl Mcmillan MRN: [id number] DOB:March 01, 1976, 46 y.o., female Today's Date: 04/11/2022   PCP: Windell Hummingbird, PA-C REFERRING PROVIDER: Marlowe Shores, PA-C (to f/u with Izora Ribas, MD)   PT End of Session - 04/11/22 1045     Visit Number 21    Date for PT Re-Evaluation 08/14/22    Authorization Type 9/10    PT Start Time 1013    PT Stop Time 1045    PT Time Calculation (min) 32 min    Activity Tolerance Patient tolerated treatment well    Behavior During Therapy WFL for tasks assessed/performed                        Past Medical History:  Diagnosis Date   Arthritis    Breast cancer ([place])    Family history of non-Hodgkin's lymphoma 06/01/2020   GERD (gastroesophageal reflux disease)    Hemorrhoids    Migraines    PCOS (polycystic ovarian syndrome)    PONV (postoperative nausea and vomiting)    Past Surgical History:  Procedure Laterality Date   APPLICATION OF CRANIAL NAVIGATION Left 11/10/2021   Procedure: APPLICATION OF CRANIAL NAVIGATION;  Surgeon: Consuella Lose, MD;  Location: [facility];  Service: Neurosurgery;  Laterality: Left;   AXILLARY SENTINEL NODE BIOPSY Left 11/07/2020   Procedure: LEFT AXILLARY SENTINEL NODE BIOPSY;  Surgeon: [name], MD;  Location: [facility];  Service: General;  Laterality: Left;   BREAST CYST EXCISION Right 09/22/2021   Procedure: Excision of right axillary excess soft tissue.;  Surgeon: [name], MD;  Location: [facility];  Service: Plastics;  Laterality: Right;   BREAST RECONSTRUCTION WITH PLACEMENT OF TISSUE EXPANDER AND FLEX HD (ACELLULAR HYDRATED DERMIS) Bilateral 11/07/2020   Procedure: BILATERAL BREAST RECONSTRUCTION WITH PLACEMENT OF TISSUE EXPANDER AND FLEX HD (ACELLULAR HYDRATED DERMIS);  Surgeon: [name], MD;  Location: [facility];  Service: Plastics;  Laterality: Bilateral;   COSMETIC SURGERY     CRANIOTOMY Left  11/10/2021   Procedure: LT FRONTAL CRANIOTOMY TUMOR EXCISION;  Surgeon: Consuella Lose, MD;  Location: [facility];  Service: Neurosurgery;  Laterality: Left;   DILATION AND CURETTAGE OF UTERUS     HEMORRHOID SURGERY     IR IMAGING GUIDED PORT INSERTION  06/15/2020   LIPOSUCTION WITH LIPOFILLING Bilateral 09/22/2021   Procedure: Fat grafting bilateral breast;  Surgeon: [name], MD;  Location: [facility];  Service: Plastics;  Laterality: Bilateral;   MASS EXCISION Bilateral 04/25/2021   Procedure: excision of bilateral axillary breast tissue;  Surgeon: [name], MD;  Location: [facility];  Service: Plastics;  Laterality: Bilateral;   PORT-A-CATH REMOVAL Right 11/07/2020   Procedure: REMOVAL PORT-A-CATH;  Surgeon: [name], MD;  Location: [facility];  Service: General;  Laterality: Right;   REMOVAL OF BILATERAL TISSUE EXPANDERS WITH PLACEMENT OF BILATERAL BREAST IMPLANTS Bilateral 04/25/2021   Procedure: REMOVAL OF BILATERAL TISSUE EXPANDERS WITH PLACEMENT OF BILATERAL BREAST IMPLANTS;  Surgeon: [name], MD;  Location: [facility];  Service: Plastics;  Laterality: Bilateral;   ROBOTIC ASSISTED TOTAL HYSTERECTOMY WITH BILATERAL SALPINGO OOPHERECTOMY Bilateral 03/14/2021   Procedure: XI ROBOTIC ASSISTED TOTAL HYSTERECTOMY WITH BILATERAL SALPINGO OOPHORECTOMY;  Surgeon: Everitt Amber, MD;  Location: [facility];  Service: Gynecology;  Laterality: Bilateral;   TOTAL MASTECTOMY Bilateral 11/07/2020   Procedure: BILATERAL MASTECTOMY;  Surgeon: [name], MD;  Location: [facility];  Service: General;  Laterality: Bilateral;   Patient Active Problem List   Diagnosis Date Noted   Brain tumor ([place]) 11/08/2021   Metastatic cancer to brain ([place]) 11/08/2021   Cerebral edema ([place]) 11/08/2021   Complex partial seizure ([place]) 11/08/2021   Brain mass 11/07/2021   Right sided weakness 11/07/2021   IDA (iron deficiency anemia) 07/18/2021    Cholelithiasis 03/14/2021   Peripheral neuropathy due to chemotherapy ([place]) 10/20/2020   Morbid obesity with BMI of 40.0-44.9, adult ([place]) 07/19/2020   BRCA1 gene mutation positive 07/04/2020   Genetic testing 06/28/2020   Family history of non-Hodgkin's lymphoma 06/01/2020   Malignant neoplasm of upper-outer quadrant of left breast in female, estrogen receptor positive ([place]) 05/26/2020    ONSET DATE: 12/05/2021 (MD referral)  REFERRING DIAG: D49.6 (ICD-10-CM) - Neoplasm of unspecified behavior of brain  THERAPY DIAG:  Unsteadiness on feet  Other abnormalities of gait and mobility  Muscle weakness (generalized)  Other lack of coordination  Abnormal posture  Rationale for Evaluation and Treatment Rehabilitation  SUBJECTIVE:                                                                                                                                                                                              SUBJECTIVE STATEMENT:  reports that she had a bad night, reports throwing up   PERTINENT HISTORY: metastatic breast cancer, brain tumor s/p excision with craniotomy; GERD, morbid obesity.  Admitted to rehab ([facility]) 11/17/21 and d/c home 12/09/2021  PAIN:  Are you having pain? No  PRECAUTIONS: Fall and Other: wearing R GRAFO, no driving  FALLS: Has patient fallen in last 6 months? 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 Now s/p L frontal crani tumor excision 5/19.  COGNITION: Overall cognitive status: Within functional limits for tasks assessed   SENSATION:  Just starting to get sensation back on bottom of R foot Light touch: WFL  COORDINATION: Decreased coordination RLE-needs assist of LLE or UEs to place RLE   EDEMA:  Noted in RLE-pt reports   MUSCLE TONE: RLE: Hypotonic   POSTURE:  rounded shoulders  LOWER EXTREMITY ROM:     Passive  Right Eval Right 8/16  Hip flexion    Hip extension    Hip abduction    Hip adduction    Hip internal rotation    Hip external rotation    Knee flexion    Knee extension    Ankle dorsiflexion -10 from neutral neutral  Ankle plantarflexion    Ankle inversion    Ankle eversion     (Blank rows = not tested)  LOWER EXTREMITY MMT:    MMT Right Eval Left Eval Right 02/07/22 Left  02/07/22  Hip flexion 3-/5 4/5 3+ 4  Hip extension      Hip abduction 3+/5 4/5 4 4   Hip adduction 3+/5 4/5 4 4   Hip internal rotation      Hip external rotation      Knee flexion 3-/5 4+/5 3+ 5  Knee extension 3-/5 4+/5 3+ 5  Ankle dorsiflexion trace 5/5 3+ 5  Ankle plantarflexion      Ankle inversion      Ankle eversion      (Blank rows = not tested)   TRANSFERS: Assistive device utilized: Environmental consultant - 2 wheeled  Sit to stand: SBA Stand to sit: SBA  GAIT: Gait pattern: step through pattern, decreased step length- Left, decreased stance time- Right, decreased ankle dorsiflexion- Right, and genu recurvatum- Right Distance walked: 60 ft  Assistive device utilized: Walker - 2 wheeled and R AFO Level of assistance: SBA Comments: Pt is able to don R AFO  FUNCTIONAL TESTs:  5 times sit to stand: 29.13 Timed up and go (TUG): 42.62 Gait velocity:  23.87 sec = 1.37 ft/sec 5x sit<>stand performed with UE support  PATIENT SURVEYS:  FOTO N/T  TREATMENT 04/11/22 Gait outside with SPC 1.5 laps around the back building Right ankle PF/DF, calf  stretches Discussed with her the next trip she is taking and the need for scooter due to a lot of walking, also talked about safety  04/09/22 NuStep L5 x10mns  Redo BERG and 5xSTS Ladder drill one foot in each box and then side steps and then one foot in the box and out each box Feet together on airex hitting ball in // bars, then half tandem  Standing dyna disc R ankle with UE support x10 Blue ankle stretcher with UE support x10  04/04/22 STS on airex 2x10 Resisted gait 30# 4 way x4  Standing cone taps with side step  Standing on airex hitting ball Gastroc stretch in slant 30s    03/23/22 Gait outside around the back building [facility] did not use it most of the time, gait in the grass and uneven surfaces with CGA at times Practiced low toilet transfers Practice in and out of shower if she has to step over a tub side Passive stretch of the LE's and trunk Patient asked questions about transfers and we talked and she feels good about this  03/20/22 NuStep L6 x658ms  AnkleTB red x10 4 way Side steps over obstacles Beach ball kicks x10 each leg Walking on beam forwards and side steps  Standing on airex cone taps in // bars   03/13/22 Walking outside w/SPC  Nustep L5 x6m49m  Step ups 6" x10 each side  Calf raises 2x12 4 way step over 3x clockwise, 3x counterclockwise  STS on airex w/OHP x10 Standing on airex rotations w/yellow ball 2x5  Patient's gait assessed without AFO, agree she doe snot need to use it  when indoors, but recommend using it when out of the house for safety due to fatigue. BERG assessed-41/56 Practiced forward weight shifts over RLE without hyperextension at parallel bars, difficutly Wbing with R knee control.  PATIENT EDUCATION: Education details: PT POC, Eval results, initial HEP Person educated: Patient Education method: Explanation, Demonstration, and Handouts Education comprehension: verbalized understanding and returned demonstration   HOME  EXERCISE PROGRAM: Access Code: [email or web address] URL: [email or web address] Date: 12/13/2021 Prepared by: [facility]  Exercises - Seated Heel Slide  - 1-2 x daily - 7 x weekly - 1 sets - 5-10 reps - 10-15 sec hold - Sit to Stand with Counter Support  - 1-2 x daily - 7 x weekly - 1-2 sets - 5 reps - Seated Gluteal Sets  - 1-2 x daily - 7 x weekly - 1-2 sets - 10 reps - 3 sec hold    GOALS: Goals reviewed with patient? Yes  SHORT TERM GOALS: Target date: 01/10/2022  Pt will be independent with HEP for improved strength, balance, gait. Baseline:  Initial HEP from [facility] Goal status: met  2.  Pt will improve 5x sit<>stand to less than or equal to 20 sec to demonstrate improved functional strength and transfer efficiency. Baseline: 29.13 sec with BUE support (scores >15 sec indicate fall risk), 18.71s no UE support Goal status: met  3.  Patient will score at least 50 or more on BERG to demonstrate improved balance and decreased risk for falls.. Baseline: 41, 44/56-8/16, 48/56-10/16 Goal status: IN PROGRESS   LONG TERM GOALS: Target date: 02/07/2022  Pt will be independent with HEP for improved strength, balance, transfers, and gait. Baseline:  Initial HEP from CIR Goal status: ongoing  2.  Pt will improve 5x sit<>stand to less than or equal to 15 sec to demonstrate improved functional strength and transfer efficiency. Baseline: 29.13 sec with BUE support, 18.71s- 8/16, 14.69s-10/16 Goal status: MET  3.  Pt will improve TUG score to less than or equal to 25 sec for decreased fall risk. Baseline: 42.62 sec (indicates increased fall risk and difficulty with ADLs in home), 18.12s-8/16 Goal status: MET  4.  Pt will improve gait velocity to at least 1.8 ft/sec for improved gait efficiency and safety. Baseline: 1.37 ft/sec (1.31-2.62 indicates increased limited community ambulator), 1.01 ft/sec Goal status: MET   ASSESSMENT:  CLINICAL  IMPRESSION: Patient not feeling well today, we did a long walk and then discussed her safety with walking and her next trip and tripping hazards and stretching of the ankle and toes  OBJECTIVE IMPAIRMENTS Abnormal gait, decreased balance, decreased mobility, difficulty walking, decreased ROM, decreased strength, impaired flexibility, and impaired tone.   ACTIVITY LIMITATIONS standing, transfers, locomotion level, and caring for others  PARTICIPATION LIMITATIONS: meal prep, driving, shopping, community activity, and occupation  PERSONAL FACTORS 3+ comorbidities: see above for PMH  are also affecting patient's functional outcome.   REHAB POTENTIAL: Good  CLINICAL DECISION MAKING: Evolving/moderate complexity  EVALUATION COMPLEXITY: Moderate  PLAN: PT FREQUENCY: 2x/week  PT DURATION: 8 weeks plus eval  PLANNED INTERVENTIONS: Therapeutic exercises, Therapeutic activity, Neuromuscular re-education, Balance training, Gait training, Patient/Family education, Joint mobilization, Orthotic/Fit training, DME instructions, Aquatic Therapy, and Manual therapy  PLAN FOR NEXT SESSION:   d/c next visit   Lum Babe, PT 04/11/2022, 10:46 AM    [place] [facility]

## 2022-04-17 NOTE — Therapy (Signed)
OUTPATIENT PHYSICAL THERAPY NEURO TREATMENT   Patient Name: Sheryl Mcmillan MRN: [id number] DOB:Nov 15, 1975, 46 y.o., female Today's Date: 04/18/2022   PCP: Windell Hummingbird, PA-C REFERRING PROVIDER: Marlowe Shores, PA-C (to f/u with Izora Ribas, MD)   PT End of Session - 04/18/22 1400     Visit Number 22    Date for PT Re-Evaluation 08/14/22    Authorization Type 10/10    PT Start Time 1400    PT Stop Time 1440    PT Time Calculation (min) 40 min    Activity Tolerance Patient tolerated treatment well    Behavior During Therapy [facility] for tasks assessed/performed                         Past Medical History:  Diagnosis Date   Arthritis    Breast cancer ([place])    Family history of non-Hodgkin's lymphoma 06/01/2020   GERD (gastroesophageal reflux disease)    Hemorrhoids    Migraines    PCOS (polycystic ovarian syndrome)    PONV (postoperative nausea and vomiting)    Past Surgical History:  Procedure Laterality Date   APPLICATION OF CRANIAL NAVIGATION Left 11/10/2021   Procedure: APPLICATION OF CRANIAL NAVIGATION;  Surgeon: Consuella Lose, MD;  Location: [place];  Service: Neurosurgery;  Laterality: Left;   AXILLARY SENTINEL NODE BIOPSY Left 11/07/2020   Procedure: LEFT AXILLARY SENTINEL NODE BIOPSY;  Surgeon: [name], MD;  Location: [place];  Service: General;  Laterality: Left;   BREAST CYST EXCISION Right 09/22/2021   Procedure: Excision of right axillary excess soft tissue.;  Surgeon: [name], MD;  Location: [place];  Service: Plastics;  Laterality: Right;   BREAST RECONSTRUCTION WITH PLACEMENT OF TISSUE EXPANDER AND FLEX HD (ACELLULAR HYDRATED DERMIS) Bilateral 11/07/2020   Procedure: BILATERAL BREAST RECONSTRUCTION WITH PLACEMENT OF TISSUE EXPANDER AND FLEX HD (ACELLULAR HYDRATED DERMIS);  Surgeon: [name], MD;  Location: [place];  Service: Plastics;  Laterality: Bilateral;   COSMETIC SURGERY     CRANIOTOMY Left  11/10/2021   Procedure: LT FRONTAL CRANIOTOMY TUMOR EXCISION;  Surgeon: Consuella Lose, MD;  Location: [place];  Service: Neurosurgery;  Laterality: Left;   DILATION AND CURETTAGE OF UTERUS     HEMORRHOID SURGERY     IR IMAGING GUIDED PORT INSERTION  06/15/2020   LIPOSUCTION WITH LIPOFILLING Bilateral 09/22/2021   Procedure: Fat grafting bilateral breast;  Surgeon: [name], MD;  Location: [place];  Service: Plastics;  Laterality: Bilateral;   MASS EXCISION Bilateral 04/25/2021   Procedure: excision of bilateral axillary breast tissue;  Surgeon: [name], MD;  Location: [place];  Service: Plastics;  Laterality: Bilateral;   PORT-A-CATH REMOVAL Right 11/07/2020   Procedure: REMOVAL PORT-A-CATH;  Surgeon: [name], MD;  Location: [place];  Service: General;  Laterality: Right;   REMOVAL OF BILATERAL TISSUE EXPANDERS WITH PLACEMENT OF BILATERAL BREAST IMPLANTS Bilateral 04/25/2021   Procedure: REMOVAL OF BILATERAL TISSUE EXPANDERS WITH PLACEMENT OF BILATERAL BREAST IMPLANTS;  Surgeon: [name], MD;  Location: [place];  Service: Plastics;  Laterality: Bilateral;   ROBOTIC ASSISTED TOTAL HYSTERECTOMY WITH BILATERAL SALPINGO OOPHERECTOMY Bilateral 03/14/2021   Procedure: XI ROBOTIC ASSISTED TOTAL HYSTERECTOMY WITH BILATERAL SALPINGO OOPHORECTOMY;  Surgeon: Everitt Amber, MD;  Location: [facility];  Service: Gynecology;  Laterality: Bilateral;   TOTAL MASTECTOMY Bilateral 11/07/2020   Procedure: BILATERAL MASTECTOMY;  Surgeon: [name], MD;  Location: [place];  Service: General;  Laterality: Bilateral;   Patient Active Problem List   Diagnosis Date Noted   Brain tumor ([place]) 11/08/2021   Metastatic cancer to brain ([place]) 11/08/2021   Cerebral edema ([place]) 11/08/2021   Complex partial seizure ([place]) 11/08/2021   Brain mass 11/07/2021   Right sided weakness 11/07/2021   IDA (iron deficiency anemia) 07/18/2021    Cholelithiasis 03/14/2021   Peripheral neuropathy due to chemotherapy ([place]) 10/20/2020   Morbid obesity with BMI of 40.0-44.9, adult ([place]) 07/19/2020   BRCA1 gene mutation positive 07/04/2020   Genetic testing 06/28/2020   Family history of non-Hodgkin's lymphoma 06/01/2020   Malignant neoplasm of upper-outer quadrant of left breast in female, estrogen receptor positive ([place]) 05/26/2020    ONSET DATE: 12/05/2021 (MD referral)  REFERRING DIAG: D49.6 (ICD-10-CM) - Neoplasm of unspecified behavior of brain  THERAPY DIAG:  Unsteadiness on feet  Other abnormalities of gait and mobility  Muscle weakness (generalized)  Other lack of coordination  Rationale for Evaluation and Treatment Rehabilitation  SUBJECTIVE:                                                                                                                                                                                              SUBJECTIVE STATEMENT:  feeling better today, I think I had food poisoning.    PERTINENT HISTORY: metastatic breast cancer, brain tumor s/p excision with craniotomy; GERD, morbid obesity.  Admitted to rehab ([facility]) 11/17/21 and d/c home 12/09/2021  PAIN:  Are you having pain? No  PRECAUTIONS: Fall and Other: wearing R GRAFO, no driving  FALLS: Has patient fallen in last 6 months? 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 Now s/p L frontal  crani tumor excision 5/19.  COGNITION: Overall cognitive status: Within functional limits for tasks assessed   SENSATION:  Just starting to get sensation back on bottom of R foot Light touch: WFL  COORDINATION: Decreased coordination RLE-needs assist of LLE or UEs to place RLE   EDEMA:  Noted in RLE-pt reports   MUSCLE TONE: RLE: Hypotonic   POSTURE: rounded shoulders  LOWER EXTREMITY ROM:     Passive  Right Eval Right 8/16  Hip flexion    Hip extension    Hip abduction    Hip adduction    Hip internal rotation    Hip external rotation    Knee flexion    Knee extension    Ankle dorsiflexion -10 from neutral neutral  Ankle plantarflexion    Ankle inversion    Ankle eversion     (Blank rows = not tested)  LOWER EXTREMITY MMT:    MMT Right Eval Left Eval Right 02/07/22 Left  02/07/22  Hip flexion 3-/5 4/5 3+ 4  Hip extension      Hip abduction 3+/5 4/5 4 4   Hip adduction 3+/5 4/5 4 4   Hip internal rotation      Hip external rotation      Knee flexion 3-/5 4+/5 3+ 5  Knee extension 3-/5 4+/5 3+ 5  Ankle dorsiflexion trace 5/5 3+ 5  Ankle plantarflexion      Ankle inversion      Ankle eversion      (Blank rows = not tested)   TRANSFERS: Assistive device utilized: Environmental consultant - 2 wheeled  Sit to stand: SBA Stand to sit: SBA  GAIT: Gait pattern: step through pattern, decreased step length- Left, decreased stance time- Right, decreased ankle dorsiflexion- Right, and genu recurvatum- Right Distance walked: 60 ft  Assistive device utilized: Walker - 2 wheeled and R AFO Level of assistance: SBA Comments: Pt is able to don R AFO  FUNCTIONAL TESTs:  5 times sit to stand: 29.13 Timed up and go (TUG): 42.62 Gait velocity:  23.87 sec = 1.37 ft/sec 5x sit<>stand performed with UE support  PATIENT SURVEYS:  FOTO N/T  TREATMENT 04/18/22 Walking outside Leg ext 20# 2x10 HS curls 25# 2x10 Leg press 40# 2x10 Shoulder ext 10# 2x10 Cable rows 10# 2x10 S2S on  airex 2x10 with blue ball press out   04/11/22 Gait outside with SPC 1.5 laps around the back building Right ankle PF/DF, calf stretches Discussed with her the next trip she is taking and the need for scooter due to a lot of walking, also talked about safety  04/09/22 NuStep L5 x51mns  Redo BERG and 5xSTS Ladder drill one foot in each box and then side steps and then one foot in the box and out each box Feet together on airex hitting ball in // bars, then half tandem  Standing dyna disc R ankle with UE support x10 Blue ankle stretcher with UE support x10  04/04/22 STS on airex 2x10 Resisted gait 30# 4 way x4  Standing cone taps with side step  Standing on airex hitting ball Gastroc stretch in slant 30s    03/23/22 Gait outside around the back building [facility] not use it most of the time, gait in the grass and uneven surfaces with CGA at times Practiced low toilet transfers Practice in and out of shower if she has to step over a tub side Passive stretch of the LE's and trunk Patient asked questions about transfers and we talked and she feels good about this  03/20/22 NuStep L6 x64ms  AnkleTB red x10 4 way Side steps over obstacles Beach ball kicks x10 each leg Walking on beam forwards and side steps  Standing on airex cone taps in // bars   03/13/22 Walking outside w/SPC  Nustep L5 x6m49m  Step ups 6" x10 each side  Calf raises 2x12 4 way step over  3x clockwise, 3x counterclockwise  STS on airex w/OHP x10 Standing on airex rotations w/yellow ball 2x5  Patient's gait assessed without AFO, agree she doe snot need to use it when indoors, but recommend using it when out of the house for safety due to fatigue. BERG assessed-41/56 Practiced forward weight shifts over RLE without hyperextension at parallel bars, difficutly Wbing with R knee control.  PATIENT EDUCATION: Education details: PT POC, Eval results, initial HEP Person educated: Patient Education method:  Explanation, Demonstration, and Handouts Education comprehension: verbalized understanding and returned demonstration   HOME EXERCISE PROGRAM: Access Code: [email or web address] URL: [email or web address] Date: 12/13/2021 Prepared by: [facility]  Exercises - Seated Heel Slide  - 1-2 x daily - 7 x weekly - 1 sets - 5-10 reps - 10-15 sec hold - Sit to Stand with Counter Support  - 1-2 x daily - 7 x weekly - 1-2 sets - 5 reps - Seated Gluteal Sets  - 1-2 x daily - 7 x weekly - 1-2 sets - 10 reps - 3 sec hold    GOALS: Goals reviewed with patient? Yes  SHORT TERM GOALS: Target date: 01/10/2022  Pt will be independent with HEP for improved strength, balance, gait. Baseline:  Initial HEP from CIR Goal status: met  2.  Pt will improve 5x sit<>stand to less than or equal to 20 sec to demonstrate improved functional strength and transfer efficiency. Baseline: 29.13 sec with BUE support (scores >15 sec indicate fall risk), 18.71s no UE support Goal status: met  3.  Patient will score at least 50 or more on BERG to demonstrate improved balance and decreased risk for falls.. Baseline: 41, 44/56-8/16, 48/56-10/16 Goal status: IN PROGRESS   LONG TERM GOALS: Target date: 02/07/2022  Pt will be independent with HEP for improved strength, balance, transfers, and gait. Baseline:  Initial HEP from CIR Goal status: ongoing  2.  Pt will improve 5x sit<>stand to less than or equal to 15 sec to demonstrate improved functional strength and transfer efficiency. Baseline: 29.13 sec with BUE support, 18.71s- 8/16, 14.69s-10/16 Goal status: MET  3.  Pt will improve TUG score to less than or equal to 25 sec for decreased fall risk. Baseline: 42.62 sec (indicates increased fall risk and difficulty with ADLs in home), 18.12s-8/16 Goal status: MET  4.  Pt will improve gait velocity to at least 1.8 ft/sec for improved gait efficiency and safety. Baseline: 1.37  ft/sec (1.31-2.62 indicates increased limited community ambulator), 1.01 ft/sec Goal status: MET   ASSESSMENT:  CLINICAL IMPRESSION: Patient doing well, we discussed the importance of walking and practicing stairs every day. She is doing alternating steps now going up but step to on the way down. We worked on walking and strengthening today.   OBJECTIVE IMPAIRMENTS Abnormal gait, decreased balance, decreased mobility, difficulty walking, decreased ROM, decreased strength, impaired flexibility, and impaired tone.   ACTIVITY LIMITATIONS standing, transfers, locomotion level, and caring for others  PARTICIPATION LIMITATIONS: meal prep, driving, shopping, community activity, and occupation  PERSONAL FACTORS 3+ comorbidities: see above for PMH  are also affecting patient's functional outcome.   REHAB POTENTIAL: Good  CLINICAL DECISION MAKING: Evolving/moderate complexity  EVALUATION COMPLEXITY: Moderate  PLAN: PT FREQUENCY: 2x/week  PT DURATION: 8 weeks plus eval  PLANNED INTERVENTIONS: Therapeutic exercises, Therapeutic activity, Neuromuscular re-education, Balance training, Gait training, Patient/Family education, Joint mobilization, Orthotic/Fit training, DME instructions, Aquatic Therapy, and Manual therapy  PLAN FOR NEXT SESSION:   d/c next visit  PHYSICAL THERAPY DISCHARGE SUMMARY  Visits from Start of Care: 22   Patient agrees to discharge. Patient goals were partially met. Patient is being discharged due to being pleased with the current functional level.   Andris Baumann, DPT 04/18/2022, 2:44 PM    [facility]

## 2022-04-18 ENCOUNTER — Ambulatory Visit: Payer: Medicaid Other

## 2022-04-18 DIAGNOSIS — R2681 Unsteadiness on feet: Secondary | ICD-10-CM | POA: Diagnosis not present

## 2022-04-18 DIAGNOSIS — R2689 Other abnormalities of gait and mobility: Secondary | ICD-10-CM

## 2022-04-18 DIAGNOSIS — M6281 Muscle weakness (generalized): Secondary | ICD-10-CM

## 2022-04-18 DIAGNOSIS — R278 Other lack of coordination: Secondary | ICD-10-CM

## 2022-04-27 ENCOUNTER — Other Ambulatory Visit: Payer: Self-pay | Admitting: Radiation Therapy

## 2022-04-27 DIAGNOSIS — C7931 Secondary malignant neoplasm of brain: Secondary | ICD-10-CM

## 2022-04-30 ENCOUNTER — Other Ambulatory Visit: Payer: Self-pay | Admitting: Radiation Therapy

## 2022-04-30 ENCOUNTER — Telehealth: Payer: Self-pay | Admitting: Radiation Therapy

## 2022-04-30 NOTE — Telephone Encounter (Signed)
Spoke with Ms. Blanks about her upcoming brain MRI and telephone follow-up with Shona Simpson, PA-C. She still has some antianxiety medication to take for this scan, so she does not need a refill on this medication at this time. She knows she will need a driver when taking Ativan and that the follow-up with Bryson Ha is by telephone, not in person.   Mont Dutton R.T.(R)(T) Radiatio Special Procedures Navigator

## 2022-05-03 ENCOUNTER — Other Ambulatory Visit: Payer: Self-pay | Admitting: *Deleted

## 2022-05-03 DIAGNOSIS — Z170 Estrogen receptor positive status [ER+]: Secondary | ICD-10-CM

## 2022-05-03 NOTE — Progress Notes (Signed)
Received message from [place] team stating PET scan not approved by pt insurance.  Verbal orders received from MD to obtain CT CAP with contrast as well as Bone Scan.  Orders placed. Appt scheduled, pt notified and verbalized understanding.

## 2022-05-07 ENCOUNTER — Telehealth: Payer: Self-pay | Admitting: Surgical

## 2022-05-07 ENCOUNTER — Encounter (HOSPITAL_COMMUNITY): Payer: Medicaid Other

## 2022-05-07 NOTE — Telephone Encounter (Signed)
LVM for patient and sent My Chart message that provider is out sick and we need to reschedule her appt for May 08, 2022.

## 2022-05-08 ENCOUNTER — Encounter: Payer: Medicaid Other | Admitting: Surgical

## 2022-05-10 ENCOUNTER — Ambulatory Visit: Payer: Medicaid Other | Admitting: Hematology and Oncology

## 2022-05-10 ENCOUNTER — Other Ambulatory Visit (HOSPITAL_COMMUNITY): Payer: Medicaid Other

## 2022-05-11 ENCOUNTER — Other Ambulatory Visit (HOSPITAL_COMMUNITY): Payer: Medicaid Other

## 2022-05-15 ENCOUNTER — Encounter: Payer: Self-pay | Admitting: Hematology and Oncology

## 2022-05-15 ENCOUNTER — Ambulatory Visit: Payer: Medicaid Other | Admitting: Hematology and Oncology

## 2022-05-15 ENCOUNTER — Ambulatory Visit (HOSPITAL_COMMUNITY): Payer: Medicaid Other

## 2022-05-16 ENCOUNTER — Encounter (HOSPITAL_COMMUNITY)
Admission: RE | Admit: 2022-05-16 | Discharge: 2022-05-16 | Disposition: A | Discharge status: Home / Self Care | Payer: Medicaid Other | Source: Ambulatory Visit | Attending: Hematology and Oncology | Admitting: Hematology and Oncology

## 2022-05-16 ENCOUNTER — Ambulatory Visit (HOSPITAL_COMMUNITY)
Admission: RE | Admit: 2022-05-16 | Discharge: 2022-05-16 | Disposition: A | Discharge status: Home / Self Care | Payer: Medicaid Other | Source: Ambulatory Visit | Attending: Hematology and Oncology | Admitting: Hematology and Oncology

## 2022-05-16 DIAGNOSIS — C50412 Malignant neoplasm of upper-outer quadrant of left female breast: Secondary | ICD-10-CM | POA: Diagnosis present

## 2022-05-16 DIAGNOSIS — Z170 Estrogen receptor positive status [ER+]: Secondary | ICD-10-CM | POA: Insufficient documentation

## 2022-05-16 MED ORDER — TECHNETIUM TC 99M MEDRONATE IV KIT
20.1000 | PACK | Freq: Once | INTRAVENOUS | Status: AC
  Administered 2022-05-16: 20.1 via INTRAVENOUS

## 2022-05-16 NOTE — Progress Notes (Signed)
Patient Care Team: Camillia Herter, NP as PCP - General (Nurse Practitioner) [name], MD as Consulting Physician (General Surgery) [name], MD as Consulting Physician (Radiation Oncology) [name], MD (Inactive) as Consulting Physician (Plastic Surgery) Nicholas Lose, MD as Consulting Physician (Hematology and Oncology) Consuella Lose, MD as Consulting Physician (Neurosurgery)  DIAGNOSIS:  Encounter Diagnosis  Name Primary?   Malignant neoplasm of upper-outer quadrant of left breast in female, estrogen receptor positive ([place]) Yes    SUMMARY OF ONCOLOGIC HISTORY: Oncology History  Malignant neoplasm of upper-outer quadrant of left breast in female, estrogen receptor positive ([place])  05/26/2020 Initial Diagnosis   Malignant neoplasm of upper-outer quadrant of left breast in female, estrogen receptor positive ([place])   06/16/2020 - 09/30/2020 Chemotherapy      Patient is on Antibody Plan: BREAST TRASTUZUMAB Q21D    06/16/2020 Genetic Testing   Positive genetic testing: 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  Variant of uncertain significance in MSH3 at c.2724A>G (Silent).  No other pathogenic or uncertain variants were reported in the [facility] Multi-Cancer Panel.  The report date is June 16, 2020.    The variant of uncertain significance (VUS) in MSH3 at c.2724A>G (Silent) has been reclassified to likely benign.  The change in variant classification was made as a result of re-review of evidence in light of new variant interpretation guidelines and/or new information. The amended report date is January 10, 2021.   The Multi-Cancer Panel offered by [facility] includes sequencing and/or deletion duplication testing of the following 85 genes: AIP, ALK, APC, ATM, AXIN2,BAP1,  BARD1, BLM, BMPR1A, BRCA1, BRCA2, BRIP1, CASR, CDC73, CDH1, CDK4, CDKN1B, CDKN1C, CDKN2A (p14ARF), CDKN2A (p16INK4a), CEBPA, CHEK2, CTNNA1, DICER1, DIS3L2, EGFR (c.2369C>T,  p.Thr790Met variant only), EPCAM (Deletion/duplication testing only), FH, FLCN, GATA2, GPC3, GREM1 (Promoter region deletion/duplication testing only), HOXB13 (c.251G>A, p.Gly84Glu), HRAS, KIT, MAX, MEN1, MET, MITF (c.952G>A, p.Glu318Lys variant only), MLH1, MSH2, MSH3, MSH6, MUTYH, NBN, NF1, NF2, NTHL1, PALB2, PDGFRA, PHOX2B, PMS2, POLD1, POLE, POT1, PRKAR1A, PTCH1, PTEN, RAD50, RAD51C, RAD51D, RB1, RECQL4, RET, RNF43, RUNX1, SDHAF2, SDHA (sequence changes only), SDHB, SDHC, SDHD, SMAD4, SMARCA4, SMARCB1, SMARCE1, STK11, SUFU, TERC, TERT, TMEM127, TP53, TSC1, TSC2, VHL, WRN and WT1.    10/20/2020 - 10/20/2020 Chemotherapy    Patient is on Treatment Plan: BREAST  DOCETAXEL + CARBOPLATIN + TRASTUZUMAB + PERTUZUMAB  (TCHP) Q21D       10/20/2020 - 06/23/2021 Chemotherapy   Patient is on Treatment Plan : BREAST Trastuzumab q21d     11/10/2021 - 11/10/2021 Radiation Therapy   Site Technique Total Dose (Gy) Dose per Fx (Gy) Completed Fx Beam Energies  Brain: Brain [id number] IMRT 18/18 18 1/1 6XFFF       CHIEF COMPLIANT: Metastatic breast cancer follow-up after scans  INTERVAL HISTORY: Sheryl MIESis a 46y.o with the above mention estrogen receptor negative, Her2 positive breast cancer (s/p bilateral mastectomies); BRCA1+. She presents to the clinic for a follow-up to discuss scans. She states that she fell a couple weeks ago when she came home from the hospital. She complains of muscle spasm in the right leg.   ALLERGIES:  is allergic to carboplatin, other, and wound dressing adhesive.  MEDICATIONS:  Current Outpatient Medications  Medication Sig Dispense Refill   baclofen (LIORESAL) 10 MG tablet Take 1 tablet (10 mg total) by mouth 3 (three) times daily. 30 each 1   LORazepam (ATIVAN) 0.5 MG tablet 1 tab po 30 minutes prior to radiation or MRI scans 5 tablet 0  No current facility-administered medications for this visit.    PHYSICAL EXAMINATION: ECOG PERFORMANCE STATUS: 1 -  Symptomatic but completely ambulatory  Vitals:   05/21/22 0957  BP: (!) 146/105  Pulse: 89  Resp: 18  Temp: (!) 97.3 F (36.3 C)  SpO2: 100%   Filed Weights   05/21/22 0957  Weight: (!) 307 lb 8 oz (139.5 kg)      LABORATORY DATA:  I have reviewed the data as listed    Latest Ref Rng & Units 12/08/2021    5:06 AM 12/01/2021    6:21 AM 11/24/2021    5:59 AM  CMP  Glucose 70 - 99 mg/dL 226  224  217   BUN 6 - 20 mg/dL _0 Creatinine 0.44 - 1.00 mg/dL 0.64  0.76  0.72   Sodium 135 - 145 mmol/L 135  137  134   Potassium 3.5 - 5.1 mmol/L 4.6  3.6  4.0   Chloride 98 - 111 mmol/L 104  105  104   CO2 22 - 32 mmol/L _1 Calcium 8.9 - 10.3 mg/dL 8.4  8.9  8.8     Lab Results  Component Value Date   WBC 7.2 12/08/2021   HGB 13.3 12/08/2021   HCT 40.0 12/08/2021   MCV 85.7 12/08/2021   PLT 162 12/08/2021   NEUTROABS 13.2 (H) 11/20/2021    ASSESSMENT & PLAN:  Malignant neoplasm of upper-outer quadrant of left breast in female, estrogen receptor positive ([place]) 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, PR negative, Ki-67 50%, HER2 positive, genetics: BRCA1 mutation 06/16/2020: Neoadjuvant chemotherapy with TCH Perjeta but Perjeta was discontinued after cycle 1, Herceptin maintenance completed December 2022 11/07/2020: Bilateral mastectomies: Residual grade 3 IDC left breast negative margins, triple negative with a Ki-67 of 15% 03/14/2021: Bilateral salpingo-oophorectomy   Hospitalization 11/17/2021-12/09/2021 partial seizures, brain MRI left frontal metastases resection of the tumor metastatic breast cancer HER2 positive: Right lower extremity weakness CT CAP 11/12/2021: No distant metastatic disease. CT CAP 05/18/22: mild non specific inc in para tracheal LN Nonspecific, stable 5 mm LUL nodule Bone scan: 05/18/22: L2 compression fracture, no bone mets Radiology review: We will need to follow-up on the lung nodule as well as a paratracheal lymph  node. She has a brain MRI coming up in December.  She is able to walk with the help of a cane.   Continue surveillance with CT chest in 3 months and follow up after    Orders Placed This Encounter  Procedures   CT Chest Wo Contrast    Standing Status:   Future    Standing Expiration Date:   05/21/2023    Order Specific Question:   Is patient pregnant?    Answer:   No    Order Specific Question:   Preferred imaging location?    Answer:   [facility]   The patient has a good understanding of the overall plan. she agrees with it. she will call with any problems that may develop before the next visit here. Total time spent: 30 mins including face to face time and time spent for planning, charting and co-ordination of care   Harriette Ohara, MD 05/21/22    I Gardiner Coins am scribing for Dr. Lindi Adie  I have reviewed the above documentation for accuracy and completeness, and I agree with the above.

## 2022-05-18 ENCOUNTER — Ambulatory Visit (HOSPITAL_COMMUNITY)
Admission: RE | Admit: 2022-05-18 | Discharge: 2022-05-18 | Disposition: A | Discharge status: Home / Self Care | Payer: Medicaid Other | Source: Ambulatory Visit | Attending: Hematology and Oncology | Admitting: Hematology and Oncology

## 2022-05-18 DIAGNOSIS — C50412 Malignant neoplasm of upper-outer quadrant of left female breast: Secondary | ICD-10-CM | POA: Diagnosis present

## 2022-05-18 DIAGNOSIS — Z170 Estrogen receptor positive status [ER+]: Secondary | ICD-10-CM | POA: Diagnosis present

## 2022-05-18 MED ORDER — IOHEXOL 9 MG/ML PO SOLN
1000.0000 mL | Freq: Once | ORAL | Status: AC
  Administered 2022-05-18: 1000 mL via ORAL

## 2022-05-18 MED ORDER — IOHEXOL 300 MG/ML  SOLN
100.0000 mL | Freq: Once | INTRAMUSCULAR | Status: AC | PRN
  Administered 2022-05-18: 100 mL via INTRAVENOUS

## 2022-05-21 ENCOUNTER — Ambulatory Visit: Payer: Medicaid Other

## 2022-05-21 ENCOUNTER — Inpatient Hospital Stay: Payer: Medicaid Other | Attending: Oncology | Admitting: Hematology and Oncology

## 2022-05-21 VITALS — BP 146/105 | HR 89 | Temp 97.3°F | Resp 18 | Ht 71.0 in | Wt 307.5 lb

## 2022-05-21 DIAGNOSIS — Z1509 Genetic susceptibility to other malignant neoplasm: Secondary | ICD-10-CM | POA: Diagnosis not present

## 2022-05-21 DIAGNOSIS — Z9013 Acquired absence of bilateral breasts and nipples: Secondary | ICD-10-CM | POA: Insufficient documentation

## 2022-05-21 DIAGNOSIS — C50412 Malignant neoplasm of upper-outer quadrant of left female breast: Secondary | ICD-10-CM | POA: Insufficient documentation

## 2022-05-21 DIAGNOSIS — M62838 Other muscle spasm: Secondary | ICD-10-CM | POA: Insufficient documentation

## 2022-05-21 DIAGNOSIS — Z79899 Other long term (current) drug therapy: Secondary | ICD-10-CM | POA: Insufficient documentation

## 2022-05-21 DIAGNOSIS — Z170 Estrogen receptor positive status [ER+]: Secondary | ICD-10-CM | POA: Insufficient documentation

## 2022-05-21 DIAGNOSIS — C7931 Secondary malignant neoplasm of brain: Secondary | ICD-10-CM | POA: Diagnosis present

## 2022-05-21 MED ORDER — BACLOFEN 10 MG PO TABS: 10.0000 mg | ORAL_TABLET | Freq: Three times a day (TID) | ORAL | 1 refills | Status: DC

## 2022-05-21 NOTE — Assessment & Plan Note (Signed)
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, PR negative, Ki-67 50%, HER2 positive, genetics: BRCA1 mutation 06/16/2020: Neoadjuvant chemotherapy with [place] but Perjeta was discontinued after cycle 1, Herceptin maintenance completed December 2022 11/07/2020: Bilateral mastectomies: Residual grade 3 IDC left breast negative margins, triple negative with a Ki-67 of 15% 03/14/2021: Bilateral salpingo-oophorectomy   Hospitalization 11/17/2021-12/09/2021 partial seizures, brain MRI left frontal metastases resection of the tumor metastatic breast cancer HER2 positive: Right lower extremity weakness CT CAP 11/12/2021: No distant metastatic disease. CT CAP 05/18/22: mild non specific inc in para tracheal LN Nonspecific, stable 5 mm LUL nodule Bone scan: 05/18/22: L2 compression fracture, no bone mets  Continue surveillance with scans in 6 months and follow up after

## 2022-05-23 ENCOUNTER — Telehealth: Payer: Self-pay | Admitting: Hematology and Oncology

## 2022-05-23 NOTE — Telephone Encounter (Signed)
Scheduled appointment per 11/28 los. Left voicemail.

## 2022-06-04 ENCOUNTER — Ambulatory Visit: Payer: Medicaid Other | Attending: General Surgery

## 2022-06-04 VITALS — Wt 303.2 lb

## 2022-06-04 DIAGNOSIS — Z483 Aftercare following surgery for neoplasm: Secondary | ICD-10-CM | POA: Insufficient documentation

## 2022-06-04 NOTE — Therapy (Signed)
OUTPATIENT PHYSICAL THERAPY SOZO SCREENING NOTE   Patient Name: Sheryl Mcmillan MRN: [id number] DOB:05-04-1976, 46 y.o., female Today's Date: 06/04/2022  PCP: Camillia Herter, NP REFERRING PROVIDER: [name], MD   PT End of Session - 06/04/22 1024     Visit Number 22   # unchanged due to screen only   PT Start Time 1022    PT Stop Time 1026    PT Time Calculation (min) 4 min    Activity Tolerance Patient tolerated treatment well    Behavior During Therapy [facility] for tasks assessed/performed             Past Medical History:  Diagnosis Date   Arthritis    Breast cancer ([place])    Family history of non-Hodgkin's lymphoma 06/01/2020   GERD (gastroesophageal reflux disease)    Hemorrhoids    Migraines    PCOS (polycystic ovarian syndrome)    PONV (postoperative nausea and vomiting)    Past Surgical History:  Procedure Laterality Date   APPLICATION OF CRANIAL NAVIGATION Left 11/10/2021   Procedure: APPLICATION OF CRANIAL NAVIGATION;  Surgeon: Consuella Lose, MD;  Location: [place];  Service: Neurosurgery;  Laterality: Left;   AXILLARY SENTINEL NODE BIOPSY Left 11/07/2020   Procedure: LEFT AXILLARY SENTINEL NODE BIOPSY;  Surgeon: [name], MD;  Location: [place];  Service: General;  Laterality: Left;   BREAST CYST EXCISION Right 09/22/2021   Procedure: Excision of right axillary excess soft tissue.;  Surgeon: [name], MD;  Location: [place];  Service: Plastics;  Laterality: Right;   BREAST RECONSTRUCTION WITH PLACEMENT OF TISSUE EXPANDER AND FLEX HD (ACELLULAR HYDRATED DERMIS) Bilateral 11/07/2020   Procedure: BILATERAL BREAST RECONSTRUCTION WITH PLACEMENT OF TISSUE EXPANDER AND FLEX HD (ACELLULAR HYDRATED DERMIS);  Surgeon: [name], MD;  Location: [place];  Service: Plastics;  Laterality: Bilateral;   COSMETIC SURGERY     CRANIOTOMY Left 11/10/2021   Procedure: LT FRONTAL CRANIOTOMY TUMOR EXCISION;  Surgeon: Consuella Lose, MD;   Location: [place];  Service: Neurosurgery;  Laterality: Left;   DILATION AND CURETTAGE OF UTERUS     HEMORRHOID SURGERY     IR IMAGING GUIDED PORT INSERTION  06/15/2020   LIPOSUCTION WITH LIPOFILLING Bilateral 09/22/2021   Procedure: Fat grafting bilateral breast;  Surgeon: [name], MD;  Location: [place];  Service: Plastics;  Laterality: Bilateral;   MASS EXCISION Bilateral 04/25/2021   Procedure: excision of bilateral axillary breast tissue;  Surgeon: [name], MD;  Location: [place];  Service: Plastics;  Laterality: Bilateral;   PORT-A-CATH REMOVAL Right 11/07/2020   Procedure: REMOVAL PORT-A-CATH;  Surgeon: [name], MD;  Location: [place];  Service: General;  Laterality: Right;   REMOVAL OF BILATERAL TISSUE EXPANDERS WITH PLACEMENT OF BILATERAL BREAST IMPLANTS Bilateral 04/25/2021   Procedure: REMOVAL OF BILATERAL TISSUE EXPANDERS WITH PLACEMENT OF BILATERAL BREAST IMPLANTS;  Surgeon: [name], MD;  Location: [place];  Service: Plastics;  Laterality: Bilateral;   ROBOTIC ASSISTED TOTAL HYSTERECTOMY WITH BILATERAL SALPINGO OOPHERECTOMY Bilateral 03/14/2021   Procedure: XI ROBOTIC ASSISTED TOTAL HYSTERECTOMY WITH BILATERAL SALPINGO OOPHORECTOMY;  Surgeon: Everitt Amber, MD;  [facility];  Service: Gynecology;  Laterality: Bilateral;   TOTAL MASTECTOMY Bilateral 11/07/2020   Procedure: BILATERAL MASTECTOMY;  Surgeon: [name], MD;  Location: [place];  Service: General;  Laterality: Bilateral;   Patient Active Problem List   Diagnosis Date Noted   Brain tumor ([place]) 11/08/2021  Metastatic cancer to brain ([place]) 11/08/2021   Cerebral edema ([place]) 11/08/2021   Complex partial seizure ([place]) 11/08/2021   Brain mass 11/07/2021   Right sided weakness 11/07/2021   IDA (iron deficiency anemia) 07/18/2021   Cholelithiasis 03/14/2021   Peripheral neuropathy due to chemotherapy ([place]) 10/20/2020   Morbid obesity  with BMI of 40.0-44.9, adult ([place]) 07/19/2020   BRCA1 gene mutation positive 07/04/2020   Genetic testing 06/28/2020   Family history of non-Hodgkin's lymphoma 06/01/2020   Malignant neoplasm of upper-outer quadrant of left breast in female, estrogen receptor positive ([place]) 05/26/2020    REFERRING DIAG: left breast cancer at risk for lymphedema  THERAPY DIAG:  Aftercare following surgery for neoplasm  PERTINENT HISTORY: Patient was diagnosed on 05/10/2020 with left grade III invasive ductal carcinoma breast cancer. She had a bilateral mastectomy and left sentinel node biopsy (1 negative node) on 11/07/2020 It is ER positive, PR negative, and HER2 positive with a Ki67 of 50%. She underwent plastic surgery to her left arm at age 50 when her arm was severely cut in a glass door; bil breast fat grafting and excision of excess Rt axillary tissue; metastatic breast cancer, brain tumor s/p excision with craniotomy 11/10/21; GERD, morbid obesity.  Admitted to rehab ([facility]) 11/17/21 and d/c home 12/09/2021  PRECAUTIONS: left UE Lymphedema risk, None  SUBJECTIVE: Pt returns for her 3 month L-Dex screen. "I have brain mets and had brain surgery since I was here last."   PAIN:  Are you having pain? No.  SOZO SCREENING: Patient was assessed today using the SOZO machine to determine the lymphedema index score. This was compared to her baseline score. It was determined that she is within the recommended range when compared to her baseline and no further action is needed at this time. She will continue SOZO screenings. These are done every 3 months for 2 years post operatively followed by every 6 months for 2 years, and then annually.   L-DEX FLOWSHEETS - 06/04/22 1000       L-DEX LYMPHEDEMA SCREENING   Measurement Type Unilateral    L-DEX MEASUREMENT EXTREMITY Upper Extremity    POSITION  Standing    DOMINANT SIDE Right    At Risk Side Left    BASELINE SCORE (UNILATERAL) 0.9    L-DEX SCORE (UNILATERAL)  4.9    VALUE CHANGE (UNILAT) 4               Otelia Limes, PTA 06/04/2022, 10:27 AM

## 2022-06-06 ENCOUNTER — Ambulatory Visit (HOSPITAL_COMMUNITY): Admission: RE | Admit: 2022-06-06 | Payer: Medicaid Other | Source: Ambulatory Visit

## 2022-06-11 ENCOUNTER — Ambulatory Visit: Payer: Self-pay | Admitting: Radiation Oncology

## 2022-06-19 ENCOUNTER — Ambulatory Visit: Payer: Medicaid Other | Admitting: Surgical

## 2022-06-19 DIAGNOSIS — Z9013 Acquired absence of bilateral breasts and nipples: Secondary | ICD-10-CM | POA: Diagnosis not present

## 2022-06-19 DIAGNOSIS — Z853 Personal history of malignant neoplasm of breast: Secondary | ICD-10-CM

## 2022-06-19 DIAGNOSIS — Z170 Estrogen receptor positive status [ER+]: Secondary | ICD-10-CM

## 2022-06-19 NOTE — Progress Notes (Signed)
NIPPLE AREOLAR TATTOO PROCEDURE  PREOPERATIVE DIAGNOSIS:  Acquired absence of bilateral nipple areolar   POSTOPERATIVE DIAGNOSIS: Acquired absence of bilateral nipple areolar    PROCEDURES: bilateral nipple areolar tattoo   ANESTHESIA:  none  COMPLICATIONS: None.  JUSTIFICATION FOR PROCEDURE:  Sheryl Mcmillan is a 46 y.o. female with a history of breast cancer status post breast reconstruction. The patient presents for nipple areolar complex tattoo. Risks, benefits, indications, and alternatives of the above described procedures were discussed with the patient and all the patient's questions were answered. Consent was signed.  DESCRIPTION OF PROCEDURE: After informed consent was obtained and proper identification of patient and surgical site was made, the patient was taken to the procedure room and resting in procedure chair. The patient was prepped and draped in the usual sterile fashion. Attention was turned to the selection of flesh colored permanent tattoo ink to produce the appropriate color for nipple areolar complex tattooing. Color, size and location was confirmed with the patient. Pre-op photos were obtained and placed in patient chart with patient consent. Using a Digital Revo 7R pronged tattoo head, pigment was instilled to the designed nipple areolar complex, which was confirmed preoperatively with the patient. Once adequate pigment had been applied to the nipple areolar complex, vaseline followed by an occlusive dressing was applied. The patient tolerated the procedure well. There were no complications  A 2:1 ratio of Tan Honey and Fair Honey was used to create the desired color 2 part tan honey -  Exp 06/2024  1 part fair honey -  Exp 06/2024  The total area tattooed: 24.5cm^2, each areola was 12.5 square cm.

## 2022-06-20 ENCOUNTER — Other Ambulatory Visit (HOSPITAL_COMMUNITY): Payer: Medicaid Other

## 2022-06-27 ENCOUNTER — Ambulatory Visit (HOSPITAL_COMMUNITY)
Admission: RE | Admit: 2022-06-27 | Discharge: 2022-06-27 | Disposition: A | Discharge status: Home / Self Care | Payer: Medicaid Other | Source: Ambulatory Visit | Attending: Radiation Oncology | Admitting: Radiation Oncology

## 2022-06-27 DIAGNOSIS — C7931 Secondary malignant neoplasm of brain: Secondary | ICD-10-CM | POA: Insufficient documentation

## 2022-06-27 MED ORDER — GADOBUTROL 1 MMOL/ML IV SOLN
10.0000 mL | Freq: Once | INTRAVENOUS | Status: AC | PRN
  Administered 2022-06-27: 10 mL via INTRAVENOUS

## 2022-06-29 ENCOUNTER — Encounter: Payer: Self-pay | Admitting: Radiation Oncology

## 2022-06-29 NOTE — Progress Notes (Signed)
Telephone nursing interview for patient to review most recent scan results from 06/27/22.  I verified patient's identity and began nursing interview. Patient doing well.  Meaningful use complete.  Patient aware of her 3:30pm-07/02/22 telephone appointment w/ Shona Simpson PA-C. I left my extension [phone] in case patient needs anything. Patient verbalized understanding.  This concludes the interview.   Leandra Kern, LPN

## 2022-07-02 ENCOUNTER — Ambulatory Visit
Admission: RE | Admit: 2022-07-02 | Discharge: 2022-07-02 | Disposition: A | Discharge status: Home / Self Care | Payer: Medicaid Other | Source: Ambulatory Visit | Attending: Radiation Oncology | Admitting: Radiation Oncology

## 2022-07-02 DIAGNOSIS — C7931 Secondary malignant neoplasm of brain: Secondary | ICD-10-CM

## 2022-07-02 DIAGNOSIS — Z170 Estrogen receptor positive status [ER+]: Secondary | ICD-10-CM

## 2022-07-02 NOTE — Progress Notes (Signed)
Radiation Oncology         ([phone]) [phone] ________________________________  Outpatient Follow Up - Conducted via telephone at patient request.  I spoke with the patient to conduct this consult visit via telephone. The patient was notified in advance and was offered an in person or telemedicine meeting to allow for face to face communication but instead preferred to proceed with a telephone visit.   Name: Sheryl Mcmillan        MRN: [id number]  Date of Service: 07/02/2022 DOB: 1975/06/30  KJ:[id number], Flonnie Hailstone, NP  Windell Hummingbird, PA-C     REFERRING PHYSICIAN: Windell Hummingbird, PA-C   DIAGNOSIS: The primary encounter diagnosis was Metastatic cancer to brain [facility]). A diagnosis of Malignant neoplasm of upper-outer quadrant of left breast in female, estrogen receptor positive ([place]) was also pertinent to this visit.   HISTORY OF PRESENT ILLNESS: Sheryl Mcmillan is a 47 y.o. female with a diagnosis of left breast cancer with brain metastasis.  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  She received neoadjuvant chemotherapy, bilateral mastectomies without evidence of disease on the right but in the left residual T1cN1 disease was present, her tumor converted to triple negative.  She underwent hysterectomy with BSO in September 2022.    She was found to have brain disease in May 2023 when she presented with right sided extremity weakness; workup showed a 2.2 cm partially solid left superior hemisphere mass with a large area of vasogenic edema.  She  received preoperative SRS on 11/10/21, and then surgical resection the same day. Final pathology revealed metastatic adenocarcinoma consistent with her known breast primary. She has been followed in surveillance with Dr. Lindi Adie. She had recent MRI on 06/27/22 that showed a new  lesion in the right precentral gyrus measuring 2 mm by her report. When discussed in brain oncology conference, it was measured at 3 mm. She's contacted to review these results and recommendations moving forward.    PREVIOUS RADIATION THERAPY:   11/10/2021 through 11/10/2021 Preoperative SRS Site Technique Total Dose (Gy) Dose per Fx (Gy) Completed Fx Beam Energies  Brain: Brain [id number] IMRT 18/18 18 1/1 6XFFF     PAST MEDICAL HISTORY:  Past Medical History:  Diagnosis Date   Arthritis    Breast cancer ([place]    Family history of non-Hodgkin's lymphoma 06/01/2020   GERD (gastroesophageal reflux disease)    Hemorrhoids    Migraines    PCOS (polycystic ovarian syndrome)    PONV (postoperative nausea and vomiting)        PAST SURGICAL HISTORY: Past Surgical History:  Procedure Laterality Date   APPLICATION OF CRANIAL NAVIGATION Left 11/10/2021   Procedure: APPLICATION OF CRANIAL NAVIGATION;  Surgeon: NConsuella Lose MD;  [place]  Service: Neurosurgery;  Laterality: Left;   AXILLARY SENTINEL NODE BIOPSY Left 11/07/2020   Procedure: LEFT AXILLARY SENTINEL NODE BIOPSY;  Surgeon: WRolm Bookbinder MD;  Location[place]  Service: General;  Laterality: Left;   BREAST CYST EXCISION Right 09/22/2021   Procedure: Excision of right axillary excess soft tissue.;  Surgeon: PCindra Presume MD;  Location[place]  Service: Plastics;  Laterality: Right;   BREAST RECONSTRUCTION WITH PLACEMENT OF TISSUE EXPANDER AND FLEX HD (ACELLULAR HYDRATED DERMIS) Bilateral 11/07/2020   Procedure: BILATERAL BREAST RECONSTRUCTION WITH PLACEMENT OF  TISSUE EXPANDER AND FLEX HD (ACELLULAR HYDRATED DERMIS);  Surgeon: [name], MD;  Location: [place];  Service: Plastics;  Laterality: Bilateral;   COSMETIC SURGERY     CRANIOTOMY Left 11/10/2021   Procedure: LT FRONTAL CRANIOTOMY TUMOR EXCISION;  Surgeon: Consuella Lose, MD;  Location: [place];  Service: Neurosurgery;   Laterality: Left;   DILATION AND CURETTAGE OF UTERUS     HEMORRHOID SURGERY     IR IMAGING GUIDED PORT INSERTION  06/15/2020   LIPOSUCTION WITH LIPOFILLING Bilateral 09/22/2021   Procedure: Fat grafting bilateral breast;  Surgeon: [name], MD;  Location: [place];  Service: Plastics;  Laterality: Bilateral;   MASS EXCISION Bilateral 04/25/2021   Procedure: excision of bilateral axillary breast tissue;  Surgeon: [name], MD;  Location: [place];  Service: Plastics;  Laterality: Bilateral;   PORT-A-CATH REMOVAL Right 11/07/2020   Procedure: REMOVAL PORT-A-CATH;  Surgeon: [name], MD;  Location: [place];  Service: General;  Laterality: Right;   REMOVAL OF BILATERAL TISSUE EXPANDERS WITH PLACEMENT OF BILATERAL BREAST IMPLANTS Bilateral 04/25/2021   Procedure: REMOVAL OF BILATERAL TISSUE EXPANDERS WITH PLACEMENT OF BILATERAL BREAST IMPLANTS;  Surgeon: [name], MD;  Location: [place];  Service: Plastics;  Laterality: Bilateral;   ROBOTIC ASSISTED TOTAL HYSTERECTOMY WITH BILATERAL SALPINGO OOPHERECTOMY Bilateral 03/14/2021   Procedure: XI ROBOTIC ASSISTED TOTAL HYSTERECTOMY WITH BILATERAL SALPINGO OOPHORECTOMY;  Surgeon: Everitt Amber, MD;  [facility];  Service: Gynecology;  Laterality: Bilateral;   TOTAL MASTECTOMY Bilateral 11/07/2020   Procedure: BILATERAL MASTECTOMY;  Surgeon: [name], MD;  Location: [place];  Service: General;  Laterality: Bilateral;     FAMILY HISTORY:  Family History  Problem Relation Age of Onset   Hypertension Mother    Diabetes Mother    Cancer Mother 58       unknown primary   Other Mother        brain tumor; dx 54s   Non-Hodgkin's lymphoma Brother 13   Other Maternal Uncle 52       brain tumor   Breast cancer Other        MGM's niece; dx late 22s   Pancreatic cancer Neg Hx    Colon cancer Neg Hx    Endometrial cancer Neg Hx    Prostate cancer Neg Hx    Ovarian cancer  Neg Hx      SOCIAL HISTORY:  reports that she quit smoking about 22 months ago. Her smoking use included cigarettes. She has a 5.00 pack-year smoking history. She has never been exposed to tobacco smoke. She has never used smokeless tobacco. She reports that she does not currently use alcohol. She reports current drug use. Drug: Marijuana. The patient is single and lives in [place]. Her daughter [name] is often part of her office visits.   ALLERGIES: Carboplatin, Other, and Wound dressing adhesive   MEDICATIONS:  Current Outpatient Medications  Medication Sig Dispense Refill   baclofen (LIORESAL) 10 MG tablet Take 1 tablet (10 mg total) by mouth 3 (three) times daily. 30 each 1   LORazepam (ATIVAN) 0.5 MG tablet 1 tab po 30 minutes prior to radiation or MRI scans 5 tablet 0   No current facility-administered medications for this encounter.     REVIEW OF SYSTEMS: On review of systems, the patient reports that she is doing  well but in the last two weeks has felt like she's been having some difficulty feeling like her vision  has changed and she has moments where what she's seeing appears to move up and down including words she is reading on a page or screen. She denies any headaches, changes in speech, movement, or new onset of any seizures. No other complaints are verbalized.   PHYSICAL EXAM:  Unable to assess due to encounter type.   ECOG = 1  0 - Asymptomatic (Fully active, able to carry on all predisease activities without restriction)  1 - Symptomatic but completely ambulatory (Restricted in physically strenuous activity but ambulatory and able to carry out work of a light or sedentary nature. For example, light housework, office work)  2 - Symptomatic, <50% in bed during the day (Ambulatory and capable of all self care but unable to carry out any work activities. Up and about more than 50% of waking hours)  3 - Symptomatic, >50% in bed, but not bedbound (Capable of only  limited self-care, confined to bed or chair 50% or more of waking hours)  4 - Bedbound (Completely disabled. Cannot carry on any self-care. Totally confined to bed or chair)  5 - Death   Eustace Pen MM, Creech RH, Tormey DC, et al. [phone]). "Toxicity and response criteria of the [facility]". [place] Oncol. 5 (6): 649-55    LABORATORY DATA:  Lab Results  Component Value Date   WBC 7.2 12/08/2021   HGB 13.3 12/08/2021   HCT 40.0 12/08/2021   MCV 85.7 12/08/2021   PLT 162 12/08/2021   Lab Results  Component Value Date   NA 135 12/08/2021   K 4.6 12/08/2021   CL 104 12/08/2021   CO2 19 (L) 12/08/2021   Lab Results  Component Value Date   ALT 69 (H) 11/18/2021   AST 29 11/18/2021   ALKPHOS 69 11/18/2021   BILITOT 0.5 11/18/2021      RADIOGRAPHY: MR Brain W Wo Contrast  Result Date: 06/29/2022 CLINICAL DATA:  Metastatic breast cancer. History of left frontal metastasis treated with surgical resection and radiation therapy. EXAM: MRI HEAD WITHOUT AND WITH CONTRAST TECHNIQUE: Multiplanar, multiecho pulse sequences of the brain and surrounding structures were obtained without and with intravenous contrast. CONTRAST:  68m GADAVIST GADOBUTROL 1 MMOL/ML IV SOLN COMPARISON:  Head MRI 03/03/2022 FINDINGS: Brain: New lesions: 1. 2 mm enhancing lesion in the right precentral gyrus (series 1200, image 234 and series 11, image 28). No edema. Larger lesions: None. Stable or smaller lesions: 1. Mildly decreased volume of enhancement at the left frontal resection site, now proximally 12 x 7 mm (series 13, image 21 and series 1200, image 217) with slight decrease of mild residual surrounding edema. Other brain findings: There is no evidence of an acute infarct, midline shift, or extra-axial fluid collection. Chronic blood products are again noted at the left frontal resection site. The ventricles are normal in size. Vascular: Major intracranial vascular flow voids are preserved.  Skull and upper cervical spine: Left frontal vertex craniotomy. No suspicious marrow lesion. Sinuses/Orbits: Unremarkable orbits. Unchanged left sphenoid sinus mucous retention cyst. Clear mastoid air cells. Other: None. IMPRESSION: 1. New 2 mm metastasis in the right frontal lobe. 2. Mildly decreased enhancement and edema at the left frontal treatment site. Electronically Signed   By: ALogan BoresM.D.   On: 06/29/2022 12:59       IMPRESSION/PLAN: 1. Recurrent Metastatic Stage IIA, cT2N0M0, grade 3, weakly ER positive, HER2 amplified invasive ductal carcinoma of the left breast s/p neoadjuvant chemotherapy which converted to triple negative disease with  brain metastases. We reviewed her imaging and discussion from brain oncology conference. Dr. [name] favors proceeding with single fraction SRS to the new lesion seen in the right precentral gyrus. We discussed the risks, benefits, short, and long term effects of radiotherapy, as well as the curative intent, and the patient is interested in proceeding. We will plan simulation on Wednesday this week, and coordinate with our special procedures team to schedule her treatment. She will sign written consent to proceed at that time.   This encounter was conducted via telephone.  The patient has provided two factor identification and has given verbal consent for this type of encounter and has been advised to only accept a meeting of this type in a secure network environment. The time spent during this encounter was 35 minutes including preparation, discussion, and coordination of the patient's care. The attendants for this meeting include  Hayden Pedro and [name]  and Francesca Oman During the encounter,  Hayden Pedro were located at [facility] Radiation Oncology Department.  Claudette Stapler Maddy  was located at home with her daughter Mima [place].      Carola Rhine, [facility]   **Disclaimer: This note was dictated  with voice recognition software. Similar sounding words can inadvertently be transcribed and this note may contain transcription errors which may not have been corrected upon publication of note.**

## 2022-07-03 ENCOUNTER — Other Ambulatory Visit: Payer: Self-pay | Admitting: Radiation Oncology

## 2022-07-03 DIAGNOSIS — C7931 Secondary malignant neoplasm of brain: Secondary | ICD-10-CM

## 2022-07-04 ENCOUNTER — Ambulatory Visit: Payer: Medicaid Other

## 2022-07-04 ENCOUNTER — Ambulatory Visit: Payer: Medicaid Other | Admitting: Radiation Oncology

## 2022-07-04 ENCOUNTER — Other Ambulatory Visit: Payer: Self-pay

## 2022-07-04 NOTE — Progress Notes (Signed)
Has armband been applied?    Does patient have an allergy to IV contrast dye?: No   Has patient ever received premedication for IV contrast dye?:  n/a  Does patient take metformin?: No  If patient does take metformin when was the last dose: n/a  Date of lab work: 07/05/2022 BUN: 10 CR: 0.63 eGfr: >60  IV site: RT antecubital  Has IV site been added to flowsheet -YES  BP (!) 142/95 (BP Location: Right Arm, Patient Position: Sitting)   Pulse 87   Resp 16   Ht '5\' 11"'$  (1.803 m)   Wt 291 lb 12.8 oz (132.4 kg)   LMP 06/08/2020   SpO2 99%   BMI 40.70 kg/m

## 2022-07-05 ENCOUNTER — Encounter: Payer: Self-pay | Admitting: Hematology and Oncology

## 2022-07-05 ENCOUNTER — Other Ambulatory Visit: Payer: Self-pay

## 2022-07-05 ENCOUNTER — Other Ambulatory Visit: Payer: Self-pay | Admitting: Radiation Oncology

## 2022-07-05 ENCOUNTER — Ambulatory Visit
Admission: RE | Admit: 2022-07-05 | Discharge: 2022-07-05 | Disposition: A | Discharge status: Home / Self Care | Payer: Medicaid Other | Source: Ambulatory Visit | Attending: Radiation Oncology | Admitting: Radiation Oncology

## 2022-07-05 ENCOUNTER — Ambulatory Visit
Admission: RE | Admit: 2022-07-05 | Discharge: 2022-07-05 | Disposition: A | Discharge status: Home / Self Care | Payer: Medicaid Other | Source: Ambulatory Visit | Attending: Hematology and Oncology | Admitting: Hematology and Oncology

## 2022-07-05 VITALS — BP 142/95 | HR 87 | Resp 16 | Ht 71.0 in | Wt 291.8 lb

## 2022-07-05 DIAGNOSIS — C7931 Secondary malignant neoplasm of brain: Secondary | ICD-10-CM | POA: Insufficient documentation

## 2022-07-05 DIAGNOSIS — Z510 Encounter for antineoplastic radiation therapy: Secondary | ICD-10-CM | POA: Insufficient documentation

## 2022-07-05 DIAGNOSIS — C50412 Malignant neoplasm of upper-outer quadrant of left female breast: Secondary | ICD-10-CM | POA: Diagnosis present

## 2022-07-05 LAB — BUN & CREATININE (CHCC)
BUN: 10 mg/dL (ref 6–20)
Creatinine: 0.63 mg/dL (ref 0.44–1.00)
GFR, Estimated: >60 mL/min (ref >60)

## 2022-07-05 MED ORDER — LORAZEPAM 1 MG PO TABS
1.0000 mg | ORAL_TABLET | Freq: Once | ORAL | Status: AC
  Administered 2022-07-05: 1 mg via ORAL
  Filled 2022-07-05: qty 1

## 2022-07-05 MED ORDER — LORAZEPAM 1 MG PO TABS: ORAL_TABLET | ORAL | 0 refills | Status: DC

## 2022-07-09 DIAGNOSIS — Z510 Encounter for antineoplastic radiation therapy: Secondary | ICD-10-CM | POA: Diagnosis not present

## 2022-07-10 ENCOUNTER — Encounter: Payer: Self-pay | Admitting: Radiation Oncology

## 2022-07-10 ENCOUNTER — Other Ambulatory Visit: Payer: Self-pay

## 2022-07-10 ENCOUNTER — Ambulatory Visit
Admission: RE | Admit: 2022-07-10 | Discharge: 2022-07-10 | Disposition: A | Discharge status: Home / Self Care | Payer: Medicaid Other | Source: Ambulatory Visit | Attending: Radiation Oncology | Admitting: Radiation Oncology

## 2022-07-10 ENCOUNTER — Ambulatory Visit: Payer: Medicaid Other

## 2022-07-10 DIAGNOSIS — Z510 Encounter for antineoplastic radiation therapy: Secondary | ICD-10-CM | POA: Diagnosis not present

## 2022-07-10 LAB — RAD ONC ARIA SESSION SUMMARY
Course Elapsed Days: 0
Plan Fractions Treated to Date: 1
Plan Prescribed Dose Per Fraction: 20 Gy
Plan Total Fractions Prescribed: 1
Plan Total Prescribed Dose: 20 Gy
Reference Point Dosage Given to Date: 20 Gy
Reference Point Session Dosage Given: 20 Gy
Session Number: 1

## 2022-07-10 NOTE — Progress Notes (Signed)
Patient rested with [place] for 30 minutes following her SRS treatment.  Patient denies headache, dizziness, nausea, diplopia or ringing in the ears. Denies fatigue. Patient without complaints. Understands to avoid strenuous activity for the next 24 hours and call [phone] with needs.   BP 130/86   Pulse 87   Temp 98.2 F (36.8 C) (Oral)   Resp 18   LMP 06/08/2020   SpO2 100%    Maleek Craver M. Leonie Green, BSN

## 2022-07-12 ENCOUNTER — Encounter: Payer: Self-pay | Admitting: Hematology and Oncology

## 2022-07-12 NOTE — Progress Notes (Signed)
                                                                                                                                                             Patient Name: Sheryl Mcmillan MRN: [id number] DOB: [birth year] Referring Physician: Durene Fruits J Date of Service: 07/10/2022 [facility], [place]                                                        End Of Treatment Note  Diagnoses: C79.31-Secondary malignant neoplasm of brain  Cancer Staging:  Recurrent Metastatic Stage IIA, cT2N0M0, grade 3, weakly ER positive, HER2 amplified invasive ductal carcinoma of the left breast s/p neoadjuvant chemotherapy which converted to triple negative disease with brain metastases   Intent: Palliative  Radiation Treatment Dates:  07/10/2022 through 07/10/2022 SRS Treatment  Site Technique Total Dose (Gy) Dose per Fx (Gy) Completed Fx Beam Energies  Brain:   [id number]_2_[id number]_72m 3D 20/20 20 1/1 6XFFF   Narrative: The patient tolerated radiation therapy relatively well.    Plan: The patient will receive a call in about one month from the radiation oncology department. She will continue follow up in the brain oncology program and also with Dr. GLindi Adieas well.   ________________________________________________    ACarola Rhine PP[facility] Road [facility]

## 2022-07-16 NOTE — Op Note (Signed)
Name: Sheryl Mcmillan    MRN: [id number]   Date: 07/10/2022    DOB: 06-09-1976   STEREOTACTIC RADIOSURGERY OPERATIVE NOTE  PRE-OPERATIVE DIAGNOSIS:  Metastatic breast CA  POST-OPERATIVE DIAGNOSIS:  Same  PROCEDURE:  Stereotactic Radiosurgery  SURGEON:  Consuella Lose, MD  RADIATION ONCOLOGIST: Dr. Kyung Rudd, MD  TECHNIQUE:  The patient underwent a radiation treatment planning session in the radiation oncology simulation suite under the care of the radiation oncology physician and physicist.  I participated closely in the radiation treatment planning afterwards. The patient underwent planning CT which was fused to 3T high resolution MRI with 1 mm axial slices.  These images were fused on the planning system.  We contoured the gross target volumes and subsequently expanded this to yield the Planning Target Volume. I actively participated in the planning process.  I helped to define and review the target contours and also the contours of the optic pathway, eyes, brainstem and selected nearby organs at risk.  All the dose constraints for critical structures were reviewed and compared to AAPM Task Group 101.  The prescription dose conformity was reviewed.  I approved the plan electronically.    Accordingly, Sheryl Mcmillan  was brought to the TrueBeam stereotactic radiation treatment linac and placed in the custom immobilization mask.  The patient was aligned according to the IR fiducial markers with BrainLab Exactrac, then orthogonal x-rays were used in ExacTrac with the 6DOF robotic table and the shifts were made to align the patient  Sheryl Mcmillan received stereotactic radiosurgery to the right frontal lesion to a prescription dose of 20Gy uneventfully.    The detailed description of the procedure is recorded in the radiation oncology procedure note.  I was present for the duration of the procedure.  DISPOSITION:   Following delivery, the patient was transported to nursing in stable  condition and monitored for possible acute effects to be discharged to home in stable condition with follow-up in one month.  Consuella Lose, MD [facility]

## 2022-07-20 ENCOUNTER — Other Ambulatory Visit: Payer: Self-pay | Admitting: Radiation Therapy

## 2022-07-20 DIAGNOSIS — C7931 Secondary malignant neoplasm of brain: Secondary | ICD-10-CM

## 2022-07-24 ENCOUNTER — Encounter: Payer: Medicaid Other | Admitting: Surgical

## 2022-07-26 ENCOUNTER — Telehealth: Payer: Self-pay | Admitting: Radiation Therapy

## 2022-07-26 ENCOUNTER — Encounter: Payer: Self-pay | Admitting: Hematology and Oncology

## 2022-07-26 NOTE — Telephone Encounter (Signed)
I called Sheryl Mcmillan to check in and to make her aware of the brain MRI and telephone follow-up we have scheduled for her in April. During our conversation, [name] shared that she has been having headaches almost daily with light sensitivity and tenderness to the touch at the top of her head. She has been taking over the counter Tylenol or ibuprofen and this has helped. She wanted to let her treatment team know about this and to ask if any intervention is needed. I will share this with Dr. Ida Rogue nurse Blenda Nicely RN, and PA Shona Simpson, PA-C.    Mont Dutton R.T.(R)(T) Radiation Special Procedures Navigator

## 2022-07-27 ENCOUNTER — Other Ambulatory Visit: Payer: Self-pay | Admitting: Radiology

## 2022-07-27 ENCOUNTER — Telehealth: Payer: Self-pay | Admitting: Radiation Therapy

## 2022-07-27 DIAGNOSIS — C7931 Secondary malignant neoplasm of brain: Secondary | ICD-10-CM

## 2022-07-27 MED ORDER — DEXAMETHASONE 4 MG PO TABS: ORAL_TABLET | ORAL | 0 refills | Status: AC

## 2022-07-27 MED ORDER — DEXAMETHASONE 4 MG PO TABS: ORAL_TABLET | ORAL | 0 refills | Status: DC

## 2022-07-27 NOTE — Progress Notes (Signed)
  Radiation Oncology         [phone] ________________________________  Name: CALAYA GILDNER  WVP:[id number]  Date of Service: 07/27/22  DOB: Dec 11, 1975   Steroid Taper Instructions   You currently have a prescription for Dexamethasone 4 mg Tablets.   Beginning 07/27/22  Take a 4 mg tablet twice a day  Beginning 08/03/22: Take 1/2 of a tablet (which is 2 mg) twice a day  Beginning 08/10/22: Take 1/2 of a tablet (which is 2 mg) once a day  Beginning 08/17/22: Take 1/2 of a tablet (which is 2 mg) every other day and stop on 08/22/22.   Please call our office if you have any headaches, visual changes, uncontrolled movements, extremity weakness, nausea or vomiting.     Leona Singleton, [place]

## 2022-07-27 NOTE — Telephone Encounter (Signed)
I called [name] back with the recommendation from Dr. [name] to start a short course of steroids to treat her headache symptoms. This will be called in for her to pick up today and start taking. The prescription instructions will include taper details as well.   Sheryl was thankful for the call and understands to start this today.   Mont Dutton R.T.(R)(T) Radiation Special Procedures Navigator

## 2022-07-30 ENCOUNTER — Ambulatory Visit: Payer: Medicaid Other | Attending: Neurosurgery | Admitting: Physical Therapy

## 2022-07-30 ENCOUNTER — Encounter: Payer: Self-pay | Admitting: Physical Therapy

## 2022-07-30 DIAGNOSIS — R2681 Unsteadiness on feet: Secondary | ICD-10-CM | POA: Insufficient documentation

## 2022-07-30 DIAGNOSIS — R2689 Other abnormalities of gait and mobility: Secondary | ICD-10-CM | POA: Insufficient documentation

## 2022-07-30 DIAGNOSIS — R262 Difficulty in walking, not elsewhere classified: Secondary | ICD-10-CM | POA: Insufficient documentation

## 2022-07-30 DIAGNOSIS — R278 Other lack of coordination: Secondary | ICD-10-CM | POA: Insufficient documentation

## 2022-07-30 DIAGNOSIS — M6281 Muscle weakness (generalized): Secondary | ICD-10-CM | POA: Diagnosis present

## 2022-07-30 NOTE — Therapy (Signed)
OUTPATIENT PHYSICAL THERAPY NEURO EVALUATION   Patient Name: Sheryl Mcmillan MRN: [id number] DOB:Feb 24, 1976, 47 y.o., female Today's Date: 07/30/2022   PCP: Windell Hummingbird, PA-C REFERRING PROVIDER: Kathyrn Sheriff, MD     Past Medical History:  Diagnosis Date   Arthritis    Breast cancer [facility])    Family history of non-Hodgkin's lymphoma 06/01/2020   GERD (gastroesophageal reflux disease)    Hemorrhoids    Migraines    PCOS (polycystic ovarian syndrome)    PONV (postoperative nausea and vomiting)    Past Surgical History:  Procedure Laterality Date   APPLICATION OF CRANIAL NAVIGATION Left 11/10/2021   Procedure: APPLICATION OF CRANIAL NAVIGATION;  Surgeon: Consuella Lose, MD;  Location: [place];  Service: Neurosurgery;  Laterality: Left;   AXILLARY SENTINEL NODE BIOPSY Left 11/07/2020   Procedure: LEFT AXILLARY SENTINEL NODE BIOPSY;  Surgeon: [name], MD;  Location: [place];  Service: General;  Laterality: Left;   BREAST CYST EXCISION Right 09/22/2021   Procedure: Excision of right axillary excess soft tissue.;  Surgeon: [name], MD;  Location: [place];  Service: Plastics;  Laterality: Right;   BREAST RECONSTRUCTION WITH PLACEMENT OF TISSUE EXPANDER AND FLEX HD (ACELLULAR HYDRATED DERMIS) Bilateral 11/07/2020   Procedure: BILATERAL BREAST RECONSTRUCTION WITH PLACEMENT OF TISSUE EXPANDER AND FLEX HD (ACELLULAR HYDRATED DERMIS);  Surgeon: [name], MD;  Location: [place];  Service: Plastics;  Laterality: Bilateral;   COSMETIC SURGERY     CRANIOTOMY Left 11/10/2021   Procedure: LT FRONTAL CRANIOTOMY TUMOR EXCISION;  Surgeon: Consuella Lose, MD;  Location: [place];  Service: Neurosurgery;  Laterality: Left;   DILATION AND CURETTAGE OF UTERUS     HEMORRHOID SURGERY     IR IMAGING GUIDED PORT INSERTION  06/15/2020   LIPOSUCTION WITH LIPOFILLING Bilateral 09/22/2021   Procedure: Fat grafting bilateral breast;  Surgeon: [name], MD;  Location:  [place];  Service: Plastics;  Laterality: Bilateral;   MASS EXCISION Bilateral 04/25/2021   Procedure: excision of bilateral axillary breast tissue;  Surgeon: [name], MD;  Location: [place];  Service: Plastics;  Laterality: Bilateral;   PORT-A-CATH REMOVAL Right 11/07/2020   Procedure: REMOVAL PORT-A-CATH;  Surgeon: [name], MD;  Location: [place];  Service: General;  Laterality: Right;   REMOVAL OF BILATERAL TISSUE EXPANDERS WITH PLACEMENT OF BILATERAL BREAST IMPLANTS Bilateral 04/25/2021   Procedure: REMOVAL OF BILATERAL TISSUE EXPANDERS WITH PLACEMENT OF BILATERAL BREAST IMPLANTS;  Surgeon: [name], MD;  Location: [place];  Service: Plastics;  Laterality: Bilateral;   ROBOTIC ASSISTED TOTAL HYSTERECTOMY WITH BILATERAL SALPINGO OOPHERECTOMY Bilateral 03/14/2021   Procedure: XI ROBOTIC ASSISTED TOTAL HYSTERECTOMY WITH BILATERAL SALPINGO OOPHORECTOMY;  Surgeon: Everitt Amber, MD;  Location: [facility];  Service: Gynecology;  Laterality: Bilateral;   TOTAL MASTECTOMY Bilateral 11/07/2020   Procedure: BILATERAL MASTECTOMY;  Surgeon: [name], MD;  Location: [place];  Service: General;  Laterality: Bilateral;   Patient Active Problem List   Diagnosis Date Noted   Brain tumor ([employer]) 11/08/2021   Metastatic cancer to brain ([place]) 11/08/2021   Cerebral edema ([place]) 11/08/2021   Complex partial seizure ([place]) 11/08/2021   Brain mass 11/07/2021   Right sided weakness 11/07/2021   IDA (iron deficiency anemia) 07/18/2021   Cholelithiasis 03/14/2021   Peripheral neuropathy due to chemotherapy ([place]) 10/20/2020   Morbid obesity with BMI of 40.0-44.9, adult ([place]) 07/19/2020   BRCA1 gene mutation positive 07/04/2020   Genetic testing 06/28/2020  Family history of non-Hodgkin's lymphoma 06/01/2020   Malignant neoplasm of upper-outer quadrant of left breast in female, estrogen receptor positive ([place]) 05/26/2020    ONSET DATE:  12/05/2021 (MD referral)  REFERRING DIAG: D49.6 (ICD-10-CM) - Neoplasm of unspecified behavior of brain  THERAPY DIAG:  No diagnosis found.  Rationale for Evaluation and Treatment Rehabilitation  SUBJECTIVE:                                                                                                                                                                                              SUBJECTIVE STATEMENT: Nov 10, 2021 she had brain surgery for brain mets, woke up and her right side would not work.  She had rehab and overall did well and got back to a limited but functional lifestyle, she recently found a new spot in January in her brain, she had radiation  and they feel like they got it.  She does report wanting to be back as good as possible prior to the 1 year after surgery date as she has been told that is where she will be and wants to be strong and walk better, unable to drive  now Pt accompanied by: self  PERTINENT HISTORY: metastatic breast cancer, brain tumor s/p excision with craniotomy; GERD, morbid obesity.  Admitted to rehab ([facility]) 11/17/21 and d/c home 12/09/2021  PAIN:  Are you having pain? No  PRECAUTIONS: Fall and Other: no driving  FALLS: Has patient fallen in last 6 months? 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 Now s/p L frontal crani tumor excision 5/19.  COGNITION: Overall cognitive status: Within functional limits for tasks  assessed   SENSATION:  Light touch: WFL  COORDINATION: Decreased coordination RLE-needs assist of LLE or UEs to place RLE   EDEMA:  none  MUSCLE TONE: RLE: Hypotonic   POSTURE: rounded shoulders  LOWER EXTREMITY ROM:     Active Right Eval Left Eval  Hip flexion    Hip extension    Hip abduction    Hip adduction    Hip internal rotation    Hip  external rotation    Knee flexion    Knee extension    Ankle dorsiflexion 0   Ankle plantarflexion 30   Ankle inversion 10   Ankle eversion 5    (Blank rows = not tested)  LOWER EXTREMITY MMT:    MMT Right Eval Left Eval  Hip flexion 3-/5 4/5  Hip extension    Hip abduction 3-/5 4/5  Hip adduction 3+/5 4/5  Hip internal rotation    Hip external rotation    Knee flexion 3-/5 4+/5  Knee extension 3-+5 4+/5  Ankle dorsiflexion 2+ 5/5  Ankle plantarflexion 2   Ankle inversion 2+   Ankle eversion    (Blank rows = not tested)   TRANSFERS: Assistive device utilized: no device  Sit to stand: SBA Stand to sit: SBA  GAIT: Gait pattern: step through pattern, decreased step length- Left, decreased stance time- Right, decreased ankle dorsiflexion- Right, and genu recurvatum- Right decreased control of the right LE especially the foot Distance walked: 60 ft  Assistive device utilized:no device Level of assistance: SBA Going down staris is one at a time  FUNCTIONAL TESTs:  TUG 20 seconds no device 5XSTS = 22 seconds Berg 47/56  Treatment 07/30/22 Elliptical Level 5 x 2 minutes In Pbars hip flexion and hip abduction Toe raise heel raise   PATIENT EDUCATION: Education details: PT POC, Eval results, Person educated: Patient Education method: education Education comprehension: verbalized understanding and returned demonstration   HOME EXERCISE PROGRAM:    GOALS: Goals reviewed with patient? Yes  SHORT TERM GOALS: Target date: 08/14/22  Pt will be independent with HEP for improved strength, balance,  gait. Baseline:  Initial HEP from CIR Goal status: INITIAL  LONG TERM GOALS: Target date: 10/28/22  Pt will be independent with HEP for improved strength, balance, transfers, and gait. Baseline:  Initial HEP from CIR Goal status: INITIAL  2.  Pt will improve 5x sit<>stand to less than or equal to 15 sec to demonstrate improved functional strength and transfer efficiency. Baseline: 22 seconds (scores >15 sec indicate fall risk) Goal status: INITIAL  3.  Pt will improve TUG score to less than or equal to 16 sec for decreased fall risk. Baseline: 20 seconds (indicates increased fall risk and difficulty with ADLs in home) Goal status: INITIAL  4.  Increase Berg balance score to 50/56 Baseline: 47/56 Goal status: INITIAL  5.  Increase right ankle DF to 8 degress Baseline: 0 degrees Goal status: INITIAL  ASSESSMENT:  CLINICAL IMPRESSION: Patient is a 47 y.o. female who was seen today for physical therapy evaluation and treatment for metastatic brain cancer, s/p excision with craniotomy on 11/10/2021.  She participated in inpatient rehab and was d/c home 12/09/2021.  Pt has R hemiplegia, with decreased muscle strength, decreased flexibility, decreased balance, decreased independence with gait.  She demonstrates increased fall risk per 5 time sit<>stand test, TUG, scores.  Prior to hospitalization and surgery, she was independent and working.  She had PT here last fall and progressed very well, she was walking with a walker and progressed to a cane.  She has made great progress but still has issues with the right LE but some improvements since we saw her last. She recently had radiation for new met sites.  She would benefit from skilled PT to address the stated deficits to decrease fall risk and improve functional mobility and independence.  OBJECTIVE IMPAIRMENTS Abnormal gait, decreased balance, decreased mobility, difficulty walking, decreased ROM, decreased strength, impaired flexibility, and  impaired tone.   ACTIVITY LIMITATIONS standing, transfers, locomotion level, and caring for others  PARTICIPATION LIMITATIONS: meal prep, driving, shopping, community activity, and occupation  PERSONAL FACTORS 3+ comorbidities: see above for PMH  are also affecting patient's functional outcome.   REHAB POTENTIAL: Good  CLINICAL DECISION MAKING: Evolving/moderate complexity  EVALUATION COMPLEXITY: Moderate  PLAN: PT FREQUENCY: 2x/week  PT DURATION: 12 weeks plus eval  PLANNED INTERVENTIONS: Therapeutic exercises, Therapeutic activity, Neuromuscular re-education, Balance training, Gait training, Patient/Family education, Joint mobilization, Orthotic/Fit training, DME instructions, Aquatic Therapy, and Manual therapy  PLAN FOR NEXT SESSION: really push her function and the right LE use  Aliana Kreischer W, PT 07/30/2022, 7:39 AM

## 2022-07-30 NOTE — Addendum Note (Signed)
Addended by: Sumner Boast on: 07/30/2022 12:08 PM   Modules accepted: Orders

## 2022-07-31 ENCOUNTER — Ambulatory Visit: Payer: Medicaid Other | Admitting: Surgical

## 2022-07-31 DIAGNOSIS — Z853 Personal history of malignant neoplasm of breast: Secondary | ICD-10-CM | POA: Diagnosis not present

## 2022-07-31 DIAGNOSIS — Z170 Estrogen receptor positive status [ER+]: Secondary | ICD-10-CM

## 2022-07-31 DIAGNOSIS — Z9013 Acquired absence of bilateral breasts and nipples: Secondary | ICD-10-CM

## 2022-07-31 NOTE — Progress Notes (Signed)
Patient is a 47 year old female here for follow-up after nipple areola tattooing on 06/19/2022.  She is here for additional tattooing.  She reports that she is very happy overall with the tattoo, she feels as if they look very realistic and she is not having any issues.  The area has healed very well.  Of note she had her implants placed on 04/25/2021.  She is doing well from this regard, not having any issues at this point.  We elected to not do any additional tattooing at this time.  The area is well-healed and the tattoo looks very realistic.  We will plan to follow-up in 1 year for routine reconstruction follow-up.  Pictures were obtained of the patient and placed in the chart with the patient's or guardian's permission.

## 2022-08-06 NOTE — Therapy (Signed)
OUTPATIENT PHYSICAL THERAPY NEURO TREATMENT   Patient Name: Sheryl Mcmillan MRN: [id number] DOB:21-Nov-1975, 47 y.o., female Today's Date: 08/07/2022   PCP: Windell Hummingbird, PA-C REFERRING PROVIDER: Kathyrn Sheriff, MD   PT End of Session - 08/07/22 1226     Visit Number 2    Number of Visits 27    Date for PT Re-Evaluation 10/26/22    PT Start Time 1230    PT Stop Time 1315    PT Time Calculation (min) 45 min    Activity Tolerance Patient tolerated treatment well    Behavior During Therapy [facility] for tasks assessed/performed              Past Medical History:  Diagnosis Date   Arthritis    Breast cancer ([place])    Family history of non-Hodgkin's lymphoma 06/01/2020   GERD (gastroesophageal reflux disease)    Hemorrhoids    Migraines    PCOS (polycystic ovarian syndrome)    PONV (postoperative nausea and vomiting)    Past Surgical History:  Procedure Laterality Date   APPLICATION OF CRANIAL NAVIGATION Left 11/10/2021   Procedure: APPLICATION OF CRANIAL NAVIGATION;  Surgeon: Consuella Lose, MD;  Location: [place];  Service: Neurosurgery;  Laterality: Left;   AXILLARY SENTINEL NODE BIOPSY Left 11/07/2020   Procedure: LEFT AXILLARY SENTINEL NODE BIOPSY;  Surgeon: [name], MD;  Location: [place];  Service: General;  Laterality: Left;   BREAST CYST EXCISION Right 09/22/2021   Procedure: Excision of right axillary excess soft tissue.;  Surgeon: [name], MD;  Location: [place];  Service: Plastics;  Laterality: Right;   BREAST RECONSTRUCTION WITH PLACEMENT OF TISSUE EXPANDER AND FLEX HD (ACELLULAR HYDRATED DERMIS) Bilateral 11/07/2020   Procedure: BILATERAL BREAST RECONSTRUCTION WITH PLACEMENT OF TISSUE EXPANDER AND FLEX HD (ACELLULAR HYDRATED DERMIS);  Surgeon: [name], MD;  Location: [place];  Service: Plastics;  Laterality: Bilateral;   COSMETIC SURGERY     CRANIOTOMY Left 11/10/2021   Procedure: LT FRONTAL CRANIOTOMY TUMOR EXCISION;  Surgeon:  Consuella Lose, MD;  Location: [place];  Service: Neurosurgery;  Laterality: Left;   DILATION AND CURETTAGE OF UTERUS     HEMORRHOID SURGERY     IR IMAGING GUIDED PORT INSERTION  06/15/2020   LIPOSUCTION WITH LIPOFILLING Bilateral 09/22/2021   Procedure: Fat grafting bilateral breast;  Surgeon: [name], MD;  Location: [place];  Service: Plastics;  Laterality: Bilateral;   MASS EXCISION Bilateral 04/25/2021   Procedure: excision of bilateral axillary breast tissue;  Surgeon: [name], MD;  Location: [place];  Service: Plastics;  Laterality: Bilateral;   PORT-A-CATH REMOVAL Right 11/07/2020   Procedure: REMOVAL PORT-A-CATH;  Surgeon: [name], MD;  Location: [place];  Service: General;  Laterality: Right;   REMOVAL OF BILATERAL TISSUE EXPANDERS WITH PLACEMENT OF BILATERAL BREAST IMPLANTS Bilateral 04/25/2021   Procedure: REMOVAL OF BILATERAL TISSUE EXPANDERS WITH PLACEMENT OF BILATERAL BREAST IMPLANTS;  Surgeon: [name], MD;  Location: [place];  Service: Plastics;  Laterality: Bilateral;   ROBOTIC ASSISTED TOTAL HYSTERECTOMY WITH BILATERAL SALPINGO OOPHERECTOMY Bilateral 03/14/2021   Procedure: XI ROBOTIC ASSISTED TOTAL HYSTERECTOMY WITH BILATERAL SALPINGO OOPHORECTOMY;  Surgeon: Everitt Amber, MD;  [facility];  Service: Gynecology;  Laterality: Bilateral;   TOTAL MASTECTOMY Bilateral 11/07/2020   Procedure: BILATERAL MASTECTOMY;  Surgeon: [name], MD;  Location: [place];  Service: General;  Laterality: Bilateral;   Patient Active Problem List   Diagnosis Date Noted  Brain tumor ([place]) 11/08/2021   Metastatic cancer to brain ([place]) 11/08/2021   Cerebral edema ([place]) 11/08/2021   Complex partial seizure ([place]) 11/08/2021   Brain mass 11/07/2021   Right sided weakness 11/07/2021   IDA (iron deficiency anemia) 07/18/2021   Cholelithiasis 03/14/2021   Peripheral neuropathy due to chemotherapy ([place])  10/20/2020   Morbid obesity with BMI of 40.0-44.9, adult ([place]) 07/19/2020   BRCA1 gene mutation positive 07/04/2020   Genetic testing 06/28/2020   Family history of non-Hodgkin's lymphoma 06/01/2020   Malignant neoplasm of upper-outer quadrant of left breast in female, estrogen receptor positive ([place]) 05/26/2020    ONSET DATE: 12/05/2021 (MD referral)  REFERRING DIAG: D49.6 (ICD-10-CM) - Neoplasm of unspecified behavior of brain  THERAPY DIAG:  Unsteadiness on feet  Muscle weakness (generalized)  Other lack of coordination  Difficulty in walking, not elsewhere classified  Other abnormalities of gait and mobility  Rationale for Evaluation and Treatment Rehabilitation  SUBJECTIVE:                                                                                                                                                                                              SUBJECTIVE STATEMENT: Doing alright.   Pt accompanied by: self  PERTINENT HISTORY: metastatic breast cancer, brain tumor s/p excision with craniotomy; GERD, morbid obesity.  Admitted to rehab ([facility]) 11/17/21 and d/c home 12/09/2021  PAIN:  Are you having pain? No  PRECAUTIONS: Fall and Other: no driving  FALLS: Has patient fallen in last 6 months? 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 Now s/p L frontal crani tumor excision 5/19.  COGNITION: Overall cognitive status: Within  functional limits for tasks assessed   SENSATION:  Light touch: WFL  COORDINATION: Decreased coordination RLE-needs assist of LLE or UEs to place RLE   EDEMA:  none  MUSCLE TONE: RLE: Hypotonic   POSTURE: rounded shoulders  LOWER EXTREMITY ROM:     Active Right Eval Left Eval  Hip flexion    Hip extension    Hip abduction    Hip adduction    Hip  internal rotation    Hip external rotation    Knee flexion    Knee extension    Ankle dorsiflexion 0   Ankle plantarflexion 30   Ankle inversion 10   Ankle eversion 5    (Blank rows = not tested)  LOWER EXTREMITY MMT:    MMT Right Eval Left Eval  Hip flexion 3-/5 4/5  Hip extension    Hip abduction 3-/5 4/5  Hip adduction 3+/5 4/5  Hip internal rotation    Hip external rotation    Knee flexion 3-/5 4+/5  Knee extension 3-+5 4+/5  Ankle dorsiflexion 2+ 5/5  Ankle plantarflexion 2   Ankle inversion 2+   Ankle eversion    (Blank rows = not tested)   TRANSFERS: Assistive device utilized: no device  Sit to stand: SBA Stand to sit: SBA  GAIT: Gait pattern: step through pattern, decreased step length- Left, decreased stance time- Right, decreased ankle dorsiflexion- Right, and genu recurvatum- Right decreased control of the right LE especially the foot Distance walked: 60 ft  Assistive device utilized:no device Level of assistance: SBA Going down staris is one at a time  FUNCTIONAL TESTs:  TUG 20 seconds no device 5XSTS = 22 seconds Berg 47/56  Treatment 08/07/22 Treadmill with incline 3% 1.80mh x632ms  3 way hip 5# 2x10 Calf raises 2x10 Resisted side steps 40#  Fitter pushes RLE 2x10  Stepping over obstacles Leg press 20# 2x10, RLE 20# x10   07/30/22 Elliptical Level 5 x 2 minutes In Pbars hip flexion and hip abduction Toe raise heel raise   PATIENT EDUCATION: Education details: PT POC, Eval results, Person educated: Patient Education method: education Education comprehension: verbalized  understanding and returned demonstration   HOME EXERCISE PROGRAM:    GOALS: Goals reviewed with patient? Yes  SHORT TERM GOALS: Target date: 08/14/22  Pt will be independent with HEP for improved strength, balance, gait. Baseline:  Initial HEP from CIR Goal status: INITIAL  LONG TERM GOALS: Target date: 10/28/22  Pt will be independent with HEP for improved strength, balance, transfers, and gait. Baseline:  Initial HEP from CIR Goal status: INITIAL  2.  Pt will improve 5x sit<>stand to less than or equal to 15 sec to demonstrate improved functional strength and transfer efficiency. Baseline: 22 seconds (scores >15 sec indicate fall risk) Goal status: INITIAL  3.  Pt will improve TUG score to less than or equal to 16 sec for decreased fall risk. Baseline: 20 seconds (indicates increased fall risk and difficulty with ADLs in home) Goal status: INITIAL  4.  Increase Berg balance score to 50/56 Baseline: 47/56 Goal status: INITIAL  5.  Increase right ankle DF to 8 degress Baseline: 0 degrees Goal status: INITIAL  ASSESSMENT:  CLINICAL IMPRESSION: Patient is doing well, we started doing strengthening and some balance. She has to take a few extra steps with stepping over obstacles to regain balance. Does well with strengthening, continued to progress as tolerated   OBJECTIVE IMPAIRMENTS Abnormal gait, decreased balance, decreased mobility, difficulty walking, decreased ROM, decreased strength, impaired flexibility, and impaired tone.   ACTIVITY LIMITATIONS standing, transfers, locomotion level, and caring for others  PARTICIPATION LIMITATIONS: meal prep, driving, shopping, community activity, and occupation  PERSONAL FACTORS 3+ comorbidities: see above for PMH  are also affecting patient's functional outcome.   REHAB POTENTIAL: Good  CLINICAL DECISION MAKING: Evolving/moderate complexity  EVALUATION COMPLEXITY: Moderate  PLAN: PT FREQUENCY: 2x/week  PT DURATION: 12  weeks plus eval  PLANNED INTERVENTIONS: Therapeutic  exercises, Therapeutic activity, Neuromuscular re-education, Balance training, Gait training, Patient/Family education, Joint mobilization, Orthotic/Fit training, DME instructions, Aquatic Therapy, and Manual therapy  PLAN FOR NEXT SESSION: really push her function and the right LE use  Andris Baumann, PT 08/07/2022, 1:11 PM

## 2022-08-07 ENCOUNTER — Ambulatory Visit: Payer: Medicaid Other

## 2022-08-07 DIAGNOSIS — R2681 Unsteadiness on feet: Secondary | ICD-10-CM

## 2022-08-07 DIAGNOSIS — R278 Other lack of coordination: Secondary | ICD-10-CM

## 2022-08-07 DIAGNOSIS — R262 Difficulty in walking, not elsewhere classified: Secondary | ICD-10-CM

## 2022-08-07 DIAGNOSIS — M6281 Muscle weakness (generalized): Secondary | ICD-10-CM

## 2022-08-07 DIAGNOSIS — R2689 Other abnormalities of gait and mobility: Secondary | ICD-10-CM

## 2022-08-13 ENCOUNTER — Ambulatory Visit
Admission: RE | Admit: 2022-08-13 | Discharge: 2022-08-13 | Disposition: A | Discharge status: Home / Self Care | Payer: Medicaid Other | Source: Ambulatory Visit | Attending: Radiation Oncology | Admitting: Radiation Oncology

## 2022-08-13 DIAGNOSIS — C50412 Malignant neoplasm of upper-outer quadrant of left female breast: Secondary | ICD-10-CM | POA: Insufficient documentation

## 2022-08-13 DIAGNOSIS — C7931 Secondary malignant neoplasm of brain: Secondary | ICD-10-CM | POA: Insufficient documentation

## 2022-08-13 DIAGNOSIS — Z510 Encounter for antineoplastic radiation therapy: Secondary | ICD-10-CM | POA: Insufficient documentation

## 2022-08-13 NOTE — Progress Notes (Signed)
  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  The patient did note fatigue during radiation but has since improved. The patient did not note hair loss or skin changes in the field of radiation during therapy. The patient is taking dexamethasone. The patient does not have symptoms of  weakness or loss of control of the extremities. The patient does not have symptoms of headache. The patient does not have symptoms of seizure or uncontrolled movement. The patient does not have symptoms of changes in vision. The patient does not have changes in speech. The patient does not have confusion.   The patient was counseled that she will be contacted by our brain and spine navigator to schedule surveillance imaging. The patient was encouraged to call if  she have not received a call to schedule imaging, or if she develop concerns or questions regarding radiation. The patient will also continue to follow up with Dr. GLindi Adie in medical oncology.    MLeandra Kern LPN

## 2022-08-14 ENCOUNTER — Ambulatory Visit: Payer: Medicaid Other

## 2022-08-14 DIAGNOSIS — R262 Difficulty in walking, not elsewhere classified: Secondary | ICD-10-CM

## 2022-08-14 DIAGNOSIS — R2681 Unsteadiness on feet: Secondary | ICD-10-CM

## 2022-08-14 DIAGNOSIS — M6281 Muscle weakness (generalized): Secondary | ICD-10-CM

## 2022-08-14 DIAGNOSIS — R2689 Other abnormalities of gait and mobility: Secondary | ICD-10-CM

## 2022-08-14 DIAGNOSIS — R278 Other lack of coordination: Secondary | ICD-10-CM

## 2022-08-14 NOTE — Therapy (Signed)
OUTPATIENT PHYSICAL THERAPY NEURO TREATMENT   Patient Name: Sheryl Mcmillan MRN: [id number] DOB:08-02-1975, 47 y.o., female Today's Date: 08/14/2022   PCP: Windell Hummingbird, PA-C REFERRING PROVIDER: Kathyrn Sheriff, MD      Past Medical History:  Diagnosis Date   Arthritis    Breast cancer [facility])    Family history of non-Hodgkin's lymphoma 06/01/2020   GERD (gastroesophageal reflux disease)    Hemorrhoids    Migraines    PCOS (polycystic ovarian syndrome)    PONV (postoperative nausea and vomiting)    Past Surgical History:  Procedure Laterality Date   APPLICATION OF CRANIAL NAVIGATION Left 11/10/2021   Procedure: APPLICATION OF CRANIAL NAVIGATION;  Surgeon: Consuella Lose, MD;  Location: [place];  Service: Neurosurgery;  Laterality: Left;   AXILLARY SENTINEL NODE BIOPSY Left 11/07/2020   Procedure: LEFT AXILLARY SENTINEL NODE BIOPSY;  Surgeon: [name], MD;  Location: [place];  Service: General;  Laterality: Left;   BREAST CYST EXCISION Right 09/22/2021   Procedure: Excision of right axillary excess soft tissue.;  Surgeon: [name], MD;  Location: [place];  Service: Plastics;  Laterality: Right;   BREAST RECONSTRUCTION WITH PLACEMENT OF TISSUE EXPANDER AND FLEX HD (ACELLULAR HYDRATED DERMIS) Bilateral 11/07/2020   Procedure: BILATERAL BREAST RECONSTRUCTION WITH PLACEMENT OF TISSUE EXPANDER AND FLEX HD (ACELLULAR HYDRATED DERMIS);  Surgeon: [name], MD;  Location: [place];  Service: Plastics;  Laterality: Bilateral;   COSMETIC SURGERY     CRANIOTOMY Left 11/10/2021   Procedure: LT FRONTAL CRANIOTOMY TUMOR EXCISION;  Surgeon: Consuella Lose, MD;  Location: [place];  Service: Neurosurgery;  Laterality: Left;   DILATION AND CURETTAGE OF UTERUS     HEMORRHOID SURGERY     IR IMAGING GUIDED PORT INSERTION  06/15/2020   LIPOSUCTION WITH LIPOFILLING Bilateral 09/22/2021   Procedure: Fat grafting bilateral breast;  Surgeon: [name], MD;  Location:  [place];  Service: Plastics;  Laterality: Bilateral;   MASS EXCISION Bilateral 04/25/2021   Procedure: excision of bilateral axillary breast tissue;  Surgeon: [name], MD;  Location: [place];  Service: Plastics;  Laterality: Bilateral;   PORT-A-CATH REMOVAL Right 11/07/2020   Procedure: REMOVAL PORT-A-CATH;  Surgeon: [name], MD;  Location: [place];  Service: General;  Laterality: Right;   REMOVAL OF BILATERAL TISSUE EXPANDERS WITH PLACEMENT OF BILATERAL BREAST IMPLANTS Bilateral 04/25/2021   Procedure: REMOVAL OF BILATERAL TISSUE EXPANDERS WITH PLACEMENT OF BILATERAL BREAST IMPLANTS;  Surgeon: [name], MD;  Location: [place];  Service: Plastics;  Laterality: Bilateral;   ROBOTIC ASSISTED TOTAL HYSTERECTOMY WITH BILATERAL SALPINGO OOPHERECTOMY Bilateral 03/14/2021   Procedure: XI ROBOTIC ASSISTED TOTAL HYSTERECTOMY WITH BILATERAL SALPINGO OOPHORECTOMY;  Surgeon: Everitt Amber, MD;  [facility];  Service: Gynecology;  Laterality: Bilateral;   TOTAL MASTECTOMY Bilateral 11/07/2020   Procedure: BILATERAL MASTECTOMY;  Surgeon: [name], MD;  Location: [place];  Service: General;  Laterality: Bilateral;   Patient Active Problem List   Diagnosis Date Noted   Brain tumor ([place]) 11/08/2021   Metastatic cancer to brain ([place]) 11/08/2021   Cerebral edema ([place]) 11/08/2021   Complex partial seizure ([place]) 11/08/2021   Brain mass 11/07/2021   Right sided weakness 11/07/2021   IDA (iron deficiency anemia) 07/18/2021   Cholelithiasis 03/14/2021   Peripheral neuropathy due to chemotherapy ([place]) 10/20/2020   Morbid obesity with BMI of 40.0-44.9, adult ([place]) 07/19/2020   BRCA1 gene mutation positive 07/04/2020   Genetic testing 06/28/2020  Family history of non-Hodgkin's lymphoma 06/01/2020   Malignant neoplasm of upper-outer quadrant of left breast in female, estrogen receptor positive ([place]) 05/26/2020    ONSET DATE:  12/05/2021 (MD referral)  REFERRING DIAG: D49.6 (ICD-10-CM) - Neoplasm of unspecified behavior of brain  THERAPY DIAG:  No diagnosis found.  Rationale for Evaluation and Treatment Rehabilitation  SUBJECTIVE:                                                                                                                                                                                              SUBJECTIVE STATEMENT: Doing alright.   Pt accompanied by: self  PERTINENT HISTORY: metastatic breast cancer, brain tumor s/p excision with craniotomy; GERD, morbid obesity.  Admitted to rehab ([facility]) 11/17/21 and d/c home 12/09/2021  PAIN:  Are you having pain? No  PRECAUTIONS: Fall and Other: no driving  FALLS: Has patient fallen in last 6 months? 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 Now s/p L frontal crani tumor excision 5/19.  COGNITION: Overall cognitive status: Within functional limits for tasks assessed   SENSATION:  Light touch: WFL  COORDINATION: Decreased coordination RLE-needs assist of LLE or UEs to place RLE   EDEMA:  none  MUSCLE TONE: RLE: Hypotonic   POSTURE: rounded shoulders  LOWER EXTREMITY ROM:     Active Right Eval Left Eval  Hip flexion    Hip extension    Hip abduction    Hip adduction    Hip internal rotation    Hip external rotation    Knee flexion    Knee extension    Ankle dorsiflexion 0   Ankle plantarflexion  30   Ankle inversion 10   Ankle eversion 5    (Blank rows = not tested)  LOWER EXTREMITY MMT:    MMT Right Eval Left Eval  Hip flexion 3-/5 4/5  Hip extension    Hip abduction 3-/5 4/5  Hip adduction 3+/5 4/5  Hip internal rotation    Hip external rotation    Knee flexion 3-/5 4+/5  Knee extension 3-+5 4+/5  Ankle dorsiflexion  2+ 5/5  Ankle plantarflexion 2   Ankle inversion 2+   Ankle eversion    (Blank rows = not tested)   TRANSFERS: Assistive device utilized: no device  Sit to stand: SBA Stand to sit: SBA  GAIT: Gait pattern: step through pattern, decreased step length- Left, decreased stance time- Right, decreased ankle dorsiflexion- Right, and genu recurvatum- Right decreased control of the right LE especially the foot Distance walked: 60 ft  Assistive device utilized:no device Level of assistance: SBA Going down staris is one at a time  FUNCTIONAL TESTs:  TUG 20 seconds no device 5XSTS = 22 seconds Berg 47/56  Treatment 08/14/22 Step ups 6" Calf stretch 30s  Calf raises 2x12 Toe raises 2x12 Step overs 4" Stair training  STS on airex 2x10 Hitting ball standing on airex  Walking on beam Tandem standing on beam 30s  Feet together on foam 30s then eyes closed     08/07/22 Treadmill with incline 3% 1.27mh x662ms  3 way hip 5# 2x10 Calf raises 2x10 Resisted side steps 40#  Fitter pushes RLE 2x10  Stepping over obstacles Leg press 20# 2x10, RLE 20# x10   07/30/22 Elliptical Level 5 x 2 minutes In Pbars hip flexion and hip abduction Toe raise heel raise   PATIENT EDUCATION: Education details: PT POC, Eval results, Person educated: Patient Education method: education Education comprehension: verbalized understanding and returned demonstration   HOME EXERCISE PROGRAM:    GOALS: Goals reviewed with patient? Yes  SHORT TERM GOALS: Target date: 08/14/22  Pt will be independent with HEP for improved strength, balance, gait. Baseline:   Initial HEP from CIR Goal status: INITIAL  LONG TERM GOALS: Target date: 10/28/22  Pt will be independent with HEP for improved strength, balance, transfers, and gait. Baseline:  Initial HEP from CIR Goal status: INITIAL  2.  Pt will improve 5x sit<>stand to less than or equal to 15 sec to demonstrate improved functional strength and transfer efficiency. Baseline: 22 seconds (scores >15 sec indicate fall risk) Goal status: INITIAL  3.  Pt will improve TUG score to less than or equal to 16 sec for decreased fall risk. Baseline: 20 seconds (indicates increased fall risk and difficulty with ADLs in home) Goal status: INITIAL  4.  Increase Berg balance score to 50/56 Baseline: 47/56 Goal status: INITIAL  5.  Increase right ankle DF to 8 degress Baseline: 0 degrees Goal status: INITIAL  ASSESSMENT:  CLINICAL IMPRESSION: Does better with step ups today compared to last bout of therapy. Still some hesitation with stepping off backwards. With stair training she still has fear with alternating pattern coming downstairs and needs to hold on. Continue with progressive balance   OBJECTIVE IMPAIRMENTS Abnormal gait, decreased balance, decreased mobility, difficulty walking, decreased ROM, decreased strength, impaired flexibility, and impaired tone.   ACTIVITY LIMITATIONS standing, transfers, locomotion level, and caring for others  PARTICIPATION LIMITATIONS: meal prep, driving, shopping, community activity, and occupation  PERSONAL FACTORS 3+ comorbidities: see above for PMH  are also affecting patient's functional outcome.   REHAB POTENTIAL: Good  CLINICAL DECISION MAKING: Evolving/moderate complexity  EVALUATION COMPLEXITY: Moderate  PLAN: PT FREQUENCY: 2x/week  PT DURATION: 12 weeks plus eval  PLANNED INTERVENTIONS: Therapeutic exercises, Therapeutic activity, Neuromuscular re-education, Balance training, Gait training, Patient/Family education, Joint mobilization, Orthotic/Fit  training, DME instructions, Aquatic Therapy, and Manual therapy  PLAN FOR NEXT SESSION: really push her function and the right LE use  MoAndris BaumannPT 08/14/2022, 9:16 AM

## 2022-08-16 ENCOUNTER — Telehealth: Payer: Self-pay | Admitting: Hematology and Oncology

## 2022-08-16 NOTE — Telephone Encounter (Signed)
Rescheduled appointment per provider PAL. Left voicemail. 

## 2022-08-21 ENCOUNTER — Ambulatory Visit (HOSPITAL_COMMUNITY)
Admission: RE | Admit: 2022-08-21 | Discharge: 2022-08-21 | Disposition: A | Discharge status: Home / Self Care | Payer: Medicaid Other | Source: Ambulatory Visit | Attending: Hematology and Oncology | Admitting: Hematology and Oncology

## 2022-08-21 ENCOUNTER — Ambulatory Visit: Payer: Medicaid Other | Admitting: Physical Therapy

## 2022-08-21 DIAGNOSIS — Z170 Estrogen receptor positive status [ER+]: Secondary | ICD-10-CM | POA: Diagnosis present

## 2022-08-21 DIAGNOSIS — C50412 Malignant neoplasm of upper-outer quadrant of left female breast: Secondary | ICD-10-CM | POA: Insufficient documentation

## 2022-08-22 ENCOUNTER — Encounter: Payer: Self-pay | Admitting: Physical Therapy

## 2022-08-22 ENCOUNTER — Ambulatory Visit: Payer: Medicaid Other | Admitting: Physical Therapy

## 2022-08-22 DIAGNOSIS — M6281 Muscle weakness (generalized): Secondary | ICD-10-CM

## 2022-08-22 DIAGNOSIS — R278 Other lack of coordination: Secondary | ICD-10-CM

## 2022-08-22 DIAGNOSIS — R2681 Unsteadiness on feet: Secondary | ICD-10-CM | POA: Diagnosis not present

## 2022-08-22 DIAGNOSIS — R262 Difficulty in walking, not elsewhere classified: Secondary | ICD-10-CM

## 2022-08-22 NOTE — Therapy (Signed)
OUTPATIENT PHYSICAL THERAPY NEURO TREATMENT   Patient Name: Sheryl Mcmillan MRN: [id number] DOB:[birth year], 47 y.o., female Today's Date: 08/22/2022   PCP: Windell Hummingbird, PA-C REFERRING PROVIDER: Kathyrn Sheriff, MD   PT End of Session - 08/22/22 1459     Visit Number 4    Number of Visits 27    Date for PT Re-Evaluation 10/26/22    PT Start Time [id number]    PT Stop Time 1530    PT Time Calculation (min) 43 min    Activity Tolerance Patient tolerated treatment well    Behavior During Therapy [facility] for tasks assessed/performed               Past Medical History:  Diagnosis Date   Arthritis    Breast cancer ([place])    Family history of non-Hodgkin's lymphoma 06/01/2020   GERD (gastroesophageal reflux disease)    Hemorrhoids    Migraines    PCOS (polycystic ovarian syndrome)    PONV (postoperative nausea and vomiting)    Past Surgical History:  Procedure Laterality Date   APPLICATION OF CRANIAL NAVIGATION Left 11/10/2021   Procedure: APPLICATION OF CRANIAL NAVIGATION;  Surgeon: Consuella Lose, MD;  Location: [place];  Service: Neurosurgery;  Laterality: Left;   AXILLARY SENTINEL NODE BIOPSY Left 11/07/2020   Procedure: LEFT AXILLARY SENTINEL NODE BIOPSY;  Surgeon: [name], MD;  Location: [place];  Service: General;  Laterality: Left;   BREAST CYST EXCISION Right 09/22/2021   Procedure: Excision of right axillary excess soft tissue.;  Surgeon: [name], MD;  Location: [place];  Service: Plastics;  Laterality: Right;   BREAST RECONSTRUCTION WITH PLACEMENT OF TISSUE EXPANDER AND FLEX HD (ACELLULAR HYDRATED DERMIS) Bilateral 11/07/2020   Procedure: BILATERAL BREAST RECONSTRUCTION WITH PLACEMENT OF TISSUE EXPANDER AND FLEX HD (ACELLULAR HYDRATED DERMIS);  Surgeon: [name], MD;  Location: [place];  Service: Plastics;  Laterality: Bilateral;   COSMETIC SURGERY     CRANIOTOMY Left 11/10/2021   Procedure: LT FRONTAL CRANIOTOMY TUMOR EXCISION;  Surgeon:  Consuella Lose, MD;  Location: [place];  Service: Neurosurgery;  Laterality: Left;   DILATION AND CURETTAGE OF UTERUS     HEMORRHOID SURGERY     IR IMAGING GUIDED PORT INSERTION  06/15/2020   LIPOSUCTION WITH LIPOFILLING Bilateral 09/22/2021   Procedure: Fat grafting bilateral breast;  Surgeon: [name], MD;  Location: [place];  Service: Plastics;  Laterality: Bilateral;   MASS EXCISION Bilateral 04/25/2021   Procedure: excision of bilateral axillary breast tissue;  Surgeon: [name], MD;  Location: [place];  Service: Plastics;  Laterality: Bilateral;   PORT-A-CATH REMOVAL Right 11/07/2020   Procedure: REMOVAL PORT-A-CATH;  Surgeon: [name], MD;  Location: [place];  Service: General;  Laterality: Right;   REMOVAL OF BILATERAL TISSUE EXPANDERS WITH PLACEMENT OF BILATERAL BREAST IMPLANTS Bilateral 04/25/2021   Procedure: REMOVAL OF BILATERAL TISSUE EXPANDERS WITH PLACEMENT OF BILATERAL BREAST IMPLANTS;  Surgeon: [name], MD;  Location: [place];  Service: Plastics;  Laterality: Bilateral;   ROBOTIC ASSISTED TOTAL HYSTERECTOMY WITH BILATERAL SALPINGO OOPHERECTOMY Bilateral 03/14/2021   Procedure: XI ROBOTIC ASSISTED TOTAL HYSTERECTOMY WITH BILATERAL SALPINGO OOPHORECTOMY;  Surgeon: Everitt Amber, MD;  Location: [facility];  Service: Gynecology;  Laterality: Bilateral;   TOTAL MASTECTOMY Bilateral 11/07/2020   Procedure: BILATERAL MASTECTOMY;  Surgeon: [name], MD;  Location: [place];  Service: General;  Laterality: Bilateral;   Patient Active Problem List   Diagnosis Date Noted  Brain tumor ([place]) 11/08/2021   Metastatic cancer to brain ([place]) 11/08/2021   Cerebral edema ([place]) 11/08/2021   Complex partial seizure ([place]) 11/08/2021   Brain mass 11/07/2021   Right sided weakness 11/07/2021   IDA (iron deficiency anemia) 07/18/2021   Cholelithiasis 03/14/2021   Peripheral neuropathy due to chemotherapy ([place])  10/20/2020   Morbid obesity with BMI of 40.0-44.9, adult ([place]) 07/19/2020   BRCA1 gene mutation positive 07/04/2020   Genetic testing 06/28/2020   Family history of non-Hodgkin's lymphoma 06/01/2020   Malignant neoplasm of upper-outer quadrant of left breast in female, estrogen receptor positive ([place]) 05/26/2020    ONSET DATE: 12/05/2021 (MD referral)  REFERRING DIAG: D49.6 (ICD-10-CM) - Neoplasm of unspecified behavior of brain  THERAPY DIAG:  Unsteadiness on feet  Muscle weakness (generalized)  Other lack of coordination  Difficulty in walking, not elsewhere classified  Rationale for Evaluation and Treatment Rehabilitation  SUBJECTIVE:                                                                                                                                                                                              SUBJECTIVE STATEMENT: No falls, she did a very short drive in her neighborhood this past week and reports that she did okay, she reports that she had some difficulty with the control of the leg and the speed  Pt accompanied by: self  PERTINENT HISTORY: metastatic breast cancer, brain tumor s/p excision with craniotomy; GERD, morbid obesity.  Admitted to rehab ([facility]) 11/17/21 and d/c home 12/09/2021  PAIN:  Are you having pain? No  PRECAUTIONS: Fall and Other: no driving  FALLS: Has patient fallen in last 6 months? 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 Now s/p L frontal crani tumor excision 5/19.  COGNITION: Overall cognitive status: Within functional limits for tasks assessed   SENSATION:  Light touch: WFL  COORDINATION: Decreased coordination RLE-needs assist of LLE or UEs to place RLE   EDEMA:  none  MUSCLE TONE: RLE: Hypotonic   POSTURE: rounded shoulders  LOWER EXTREMITY ROM:     Active  Right Eval Left Eval  Hip flexion    Hip extension    Hip abduction    Hip adduction    Hip internal rotation    Hip external rotation    Knee flexion    Knee extension    Ankle dorsiflexion 0   Ankle plantarflexion 30   Ankle inversion 10   Ankle eversion 5    (Blank rows = not tested)  LOWER EXTREMITY MMT:    MMT Right Eval Left Eval  Hip flexion 3-/5 4/5  Hip extension    Hip abduction 3-/5 4/5  Hip adduction 3+/5 4/5  Hip internal rotation    Hip external rotation    Knee flexion 3-/5 4+/5  Knee extension 3-+5 4+/5  Ankle dorsiflexion 2+ 5/5  Ankle plantarflexion 2   Ankle inversion 2+   Ankle eversion    (Blank rows = not tested)   TRANSFERS: Assistive device utilized: no device  Sit to stand: SBA Stand to sit: SBA  GAIT: Gait pattern: step through pattern, decreased step length- Left, decreased stance time- Right, decreased ankle dorsiflexion- Right, and genu recurvatum- Right decreased control of the right LE especially the foot Distance walked: 60 ft  Assistive device utilized:no device Level of assistance: SBA Going down staris is one at a time  FUNCTIONAL TESTs:  TUG 20 seconds no device 5XSTS = 22 seconds Berg 47/56  Treatment 08/22/22 Gait around the parking Wing Airex balance beam side stepping and tandem walking Dyna disc ankle motions in sitting and standing Wobble board ankle motions in stiing and working on control Direction changes Calf stretches Right ankle red to green weight quickly simulating gas and brake Side step on and off  airex Leg press right only 20# working on control of the knee 3 ways with foot plate Right ankle calf raise on the leg press 20#   08/14/22 Step ups 6" Calf stretch 30s  Calf raises 2x12 Toe raises 2x12 Step overs 4" Stair training  STS on airex 2x10 Hitting ball standing on airex  Walking on beam Tandem standing on beam 30s  Feet together on foam 30s then eyes closed     08/07/22 Treadmill with incline 3% 1.30mh x641ms  3 way hip 5# 2x10 Calf raises 2x10 Resisted side steps 40#  Fitter pushes RLE 2x10  Stepping over obstacles Leg press 20# 2x10, RLE 20# x10   07/30/22 Elliptical Level 5 x 2 minutes In Pbars hip flexion and hip abduction Toe raise heel raise   PATIENT EDUCATION: Education details: PT POC, Eval results, Person educated: Patient Education method: education Education comprehension: verbalized understanding and returned demonstration   HOME EXERCISE PROGRAM:    GOALS: Goals reviewed with patient? Yes  SHORT TERM GOALS: Target date: 08/14/22  Pt will be independent with HEP for improved strength, balance, gait. Baseline:  Initial HEP from CIR Goal status: INITIAL  LONG TERM GOALS: Target date: 10/28/22  Pt will be independent with HEP for improved strength, balance, transfers, and gait. Baseline:  Initial HEP from CIR Goal status: INITIAL  2.  Pt will improve 5x sit<>stand to less than or equal to 15 sec to demonstrate improved functional strength and transfer efficiency. Baseline: 22 seconds (scores >15 sec indicate fall risk) Goal status: INITIAL  3.  Pt will improve TUG score to less than or equal to 16 sec for decreased fall risk. Baseline: 20 seconds (indicates increased fall risk and difficulty with ADLs in home) Goal status:ongoing  4.  Increase Berg balance  score to 50/56 Baseline: 47/56 Goal status: ongoing  5.  Increase right ankle DF to 8 degress Baseline: 0 degrees Goal status: INITIAL  ASSESSMENT:  CLINICAL  IMPRESSION: I pushed patient to go faster with walking and directions changes, she did this well but was fearful.  She is really trying to improve the ankle , I feel that there is a little better movements and control but at first she is very ataxic but with cues she smooths out  OBJECTIVE IMPAIRMENTS Abnormal gait, decreased balance, decreased mobility, difficulty walking, decreased ROM, decreased strength, impaired flexibility, and impaired tone.   ACTIVITY LIMITATIONS standing, transfers, locomotion level, and caring for others  PARTICIPATION LIMITATIONS: meal prep, driving, shopping, community activity, and occupation  PERSONAL FACTORS 3+ comorbidities: see above for PMH  are also affecting patient's functional outcome.   REHAB POTENTIAL: Good  CLINICAL DECISION MAKING: Evolving/moderate complexity  EVALUATION COMPLEXITY: Moderate  PLAN: PT FREQUENCY: 2x/week  PT DURATION: 12 weeks plus eval  PLANNED INTERVENTIONS: Therapeutic exercises, Therapeutic activity, Neuromuscular re-education, Balance training, Gait training, Patient/Family education, Joint mobilization, Orthotic/Fit training, DME instructions, Aquatic Therapy, and Manual therapy  PLAN FOR NEXT SESSION: really push her function and the right LE use  Dondre Catalfamo W, PT 08/22/2022, 3:02 PM

## 2022-08-23 NOTE — Progress Notes (Signed)
Patient Care Team: Camillia Herter, NP as PCP - General (Nurse Practitioner) [name], MD as Consulting Physician (General Surgery) [name], MD as Consulting Physician (Radiation Oncology) [name], MD as Consulting Physician (Plastic Surgery) Nicholas Lose, MD as Consulting Physician (Hematology and Oncology) Consuella Lose, MD as Consulting Physician (Neurosurgery)  DIAGNOSIS: No diagnosis found.  SUMMARY OF ONCOLOGIC HISTORY: Oncology History  Malignant neoplasm of upper-outer quadrant of left breast in female, estrogen receptor positive ([place])  05/26/2020 Initial Diagnosis   Malignant neoplasm of upper-outer quadrant of left breast in female, estrogen receptor positive ([place])   06/16/2020 - 09/30/2020 Chemotherapy      Patient is on Antibody Plan: BREAST TRASTUZUMAB Q21D    06/16/2020 Genetic Testing   Positive genetic testing: 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  Variant of uncertain significance in MSH3 at c.2724A>G (Silent).  No other pathogenic or uncertain variants were reported in the [facility] Multi-Cancer Panel.  The report date is June 16, 2020.    The variant of uncertain significance (VUS) in MSH3 at c.2724A>G (Silent) has been reclassified to likely benign.  The change in variant classification was made as a result of re-review of evidence in light of new variant interpretation guidelines and/or new information. The amended report date is January 10, 2021.   The Multi-Cancer Panel offered by [facility] includes sequencing and/or deletion duplication testing of the following 85 genes: AIP, ALK, APC, ATM, AXIN2,BAP1,  BARD1, BLM, BMPR1A, BRCA1, BRCA2, BRIP1, CASR, CDC73, CDH1, CDK4, CDKN1B, CDKN1C, CDKN2A (p14ARF), CDKN2A (p16INK4a), CEBPA, CHEK2, CTNNA1, DICER1, DIS3L2, EGFR (c.2369C>T, p.Thr790Met variant only), EPCAM (Deletion/duplication testing only), FH, FLCN, GATA2, GPC3, GREM1 (Promoter region deletion/duplication testing  only), HOXB13 (c.251G>A, p.Gly84Glu), HRAS, KIT, MAX, MEN1, MET, MITF (c.952G>A, p.Glu318Lys variant only), MLH1, MSH2, MSH3, MSH6, MUTYH, NBN, NF1, NF2, NTHL1, PALB2, PDGFRA, PHOX2B, PMS2, POLD1, POLE, POT1, PRKAR1A, PTCH1, PTEN, RAD50, RAD51C, RAD51D, RB1, RECQL4, RET, RNF43, RUNX1, SDHAF2, SDHA (sequence changes only), SDHB, SDHC, SDHD, SMAD4, SMARCA4, SMARCB1, SMARCE1, STK11, SUFU, TERC, TERT, TMEM127, TP53, TSC1, TSC2, VHL, WRN and WT1.    10/20/2020 - 10/20/2020 Chemotherapy    Patient is on Treatment Plan: BREAST  DOCETAXEL + CARBOPLATIN + TRASTUZUMAB + PERTUZUMAB  (TCHP) Q21D       10/20/2020 - 06/23/2021 Chemotherapy   Patient is on Treatment Plan : BREAST Trastuzumab q21d     11/10/2021 - 11/10/2021 Radiation Therapy   Site Technique Total Dose (Gy) Dose per Fx (Gy) Completed Fx Beam Energies  Brain: Brain [id number] IMRT 18/18 18 1/1 6XFFF       CHIEF COMPLIANT: Metastatic breast cancer   INTERVAL HISTORY: Sheryl Mcmillan a 47y.o with the above mention estrogen receptor negative, Her2 positive breast cancer (s/p bilateral mastectomies); BRCA1+. She presents to the clinic for a follow-up.   ALLERGIES:  is allergic to carboplatin, other, and wound dressing adhesive.  MEDICATIONS:  Current Outpatient Medications  Medication Sig Dispense Refill   LORazepam (ATIVAN) 1 MG tablet Take one po 30 min prior to mri or radiation 7 tablet 0   baclofen (LIORESAL) 10 MG tablet Take 1 tablet (10 mg total) by mouth 3 (three) times daily. (Patient not taking: Reported on 07/05/2022) 30 each 1   dexamethasone (DECADRON) 4 MG tablet Take 1 tablet (4 mg total) by mouth 2 (two) times daily for 7 days, THEN 0.5 tablets (2 mg total) 2 (two) times daily for 7 days, THEN 0.5 tablets (2 mg total) daily for 7 days, THEN 0.5 tablets (  2 mg total) every other day for 7 days. 26 tablet 0   No current facility-administered medications for this visit.    PHYSICAL EXAMINATION: ECOG PERFORMANCE  STATUS: {CHL ONC ECOG PS:[id number]}  There were no vitals filed for this visit. There were no vitals filed for this visit.  BREAST:*** No palpable masses or nodules in either right or left breasts. No palpable axillary supraclavicular or infraclavicular adenopathy no breast tenderness or nipple discharge. (exam performed in the presence of a chaperone)  LABORATORY DATA:  I have reviewed the data as listed    Latest Ref Rng & Units 07/05/2022   11:58 AM 12/08/2021    5:06 AM 12/01/2021    6:21 AM  CMP  Glucose 70 - 99 mg/dL  226  224   BUN 6 - 20 mg/dL '10  16  11   '$ Creatinine 0.44 - 1.00 mg/dL 0.63  0.64  0.76   Sodium 135 - 145 mmol/L  135  137   Potassium 3.5 - 5.1 mmol/L  4.6  3.6   Chloride 98 - 111 mmol/L  104  105   CO2 22 - 32 mmol/L  19  21   Calcium 8.9 - 10.3 mg/dL  8.4  8.9     Lab Results  Component Value Date   WBC 7.2 12/08/2021   HGB 13.3 12/08/2021   HCT 40.0 12/08/2021   MCV 85.7 12/08/2021   PLT 162 12/08/2021   NEUTROABS 13.2 (H) 11/20/2021    ASSESSMENT & PLAN:  No problem-specific Assessment & Plan notes found for this encounter.    No orders of the defined types were placed in this encounter.  The patient has a good understanding of the overall plan. she agrees with it. she will call with any problems that may develop before the next visit here. Total time spent: 30 mins including face to face time and time spent for planning, charting and co-ordination of care   [name], [place] 08/23/22    I Gardiner Coins am acting as a Education administrator for [employer]  ***

## 2022-08-24 ENCOUNTER — Inpatient Hospital Stay: Payer: Medicaid Other | Admitting: Hematology and Oncology

## 2022-08-27 NOTE — Therapy (Signed)
OUTPATIENT PHYSICAL THERAPY NEURO TREATMENT   Patient Name: Sheryl Mcmillan MRN: [id number] DOB:[birth year], 47 y.o., female Today's Date: 08/22/2022   PCP: Windell Hummingbird, PA-C REFERRING PROVIDER: Kathyrn Sheriff, MD   PT End of Session - 08/22/22 1459     Visit Number 4    Number of Visits 27    Date for PT Re-Evaluation 10/26/22    PT Start Time [id number]    PT Stop Time 1530    PT Time Calculation (min) 43 min    Activity Tolerance Patient tolerated treatment well    Behavior During Therapy [facility] for tasks assessed/performed               Past Medical History:  Diagnosis Date   Arthritis    Breast cancer ([place])    Family history of non-Hodgkin's lymphoma 06/01/2020   GERD (gastroesophageal reflux disease)    Hemorrhoids    Migraines    PCOS (polycystic ovarian syndrome)    PONV (postoperative nausea and vomiting)    Past Surgical History:  Procedure Laterality Date   APPLICATION OF CRANIAL NAVIGATION Left 11/10/2021   Procedure: APPLICATION OF CRANIAL NAVIGATION;  Surgeon: Consuella Lose, MD;  Location: [place];  Service: Neurosurgery;  Laterality: Left;   AXILLARY SENTINEL NODE BIOPSY Left 11/07/2020   Procedure: LEFT AXILLARY SENTINEL NODE BIOPSY;  Surgeon: [name], MD;  Location: [place];  Service: General;  Laterality: Left;   BREAST CYST EXCISION Right 09/22/2021   Procedure: Excision of right axillary excess soft tissue.;  Surgeon: [name], MD;  Location: [place];  Service: Plastics;  Laterality: Right;   BREAST RECONSTRUCTION WITH PLACEMENT OF TISSUE EXPANDER AND FLEX HD (ACELLULAR HYDRATED DERMIS) Bilateral 11/07/2020   Procedure: BILATERAL BREAST RECONSTRUCTION WITH PLACEMENT OF TISSUE EXPANDER AND FLEX HD (ACELLULAR HYDRATED DERMIS);  Surgeon: [name], MD;  Location: [place];  Service: Plastics;  Laterality: Bilateral;   COSMETIC SURGERY     CRANIOTOMY Left 11/10/2021   Procedure: LT FRONTAL CRANIOTOMY TUMOR EXCISION;  Surgeon:  Consuella Lose, MD;  Location: [place];  Service: Neurosurgery;  Laterality: Left;   DILATION AND CURETTAGE OF UTERUS     HEMORRHOID SURGERY     IR IMAGING GUIDED PORT INSERTION  06/15/2020   LIPOSUCTION WITH LIPOFILLING Bilateral 09/22/2021   Procedure: Fat grafting bilateral breast;  Surgeon: [name], MD;  Location: [place];  Service: Plastics;  Laterality: Bilateral;   MASS EXCISION Bilateral 04/25/2021   Procedure: excision of bilateral axillary breast tissue;  Surgeon: [name], MD;  Location: [place];  Service: Plastics;  Laterality: Bilateral;   PORT-A-CATH REMOVAL Right 11/07/2020   Procedure: REMOVAL PORT-A-CATH;  Surgeon: [name], MD;  Location: [place];  Service: General;  Laterality: Right;   REMOVAL OF BILATERAL TISSUE EXPANDERS WITH PLACEMENT OF BILATERAL BREAST IMPLANTS Bilateral 04/25/2021   Procedure: REMOVAL OF BILATERAL TISSUE EXPANDERS WITH PLACEMENT OF BILATERAL BREAST IMPLANTS;  Surgeon: [name], MD;  Location: [place];  Service: Plastics;  Laterality: Bilateral;   ROBOTIC ASSISTED TOTAL HYSTERECTOMY WITH BILATERAL SALPINGO OOPHERECTOMY Bilateral 03/14/2021   Procedure: XI ROBOTIC ASSISTED TOTAL HYSTERECTOMY WITH BILATERAL SALPINGO OOPHORECTOMY;  Surgeon: Everitt Amber, MD;  Location: [facility];  Service: Gynecology;  Laterality: Bilateral;   TOTAL MASTECTOMY Bilateral 11/07/2020   Procedure: BILATERAL MASTECTOMY;  Surgeon: [name], MD;  Location: [place];  Service: General;  Laterality: Bilateral;   Patient Active Problem List   Diagnosis Date Noted  Brain tumor ([place]) 11/08/2021   Metastatic cancer to brain ([place]) 11/08/2021   Cerebral edema ([place]) 11/08/2021   Complex partial seizure ([place]) 11/08/2021   Brain mass 11/07/2021   Right sided weakness 11/07/2021   IDA (iron deficiency anemia) 07/18/2021   Cholelithiasis 03/14/2021   Peripheral neuropathy due to chemotherapy ([place])  10/20/2020   Morbid obesity with BMI of 40.0-44.9, adult ([place]) 07/19/2020   BRCA1 gene mutation positive 07/04/2020   Genetic testing 06/28/2020   Family history of non-Hodgkin's lymphoma 06/01/2020   Malignant neoplasm of upper-outer quadrant of left breast in female, estrogen receptor positive ([place]) 05/26/2020    ONSET DATE: 12/05/2021 (MD referral)  REFERRING DIAG: D49.6 (ICD-10-CM) - Neoplasm of unspecified behavior of brain  THERAPY DIAG:  Unsteadiness on feet  Muscle weakness (generalized)  Other lack of coordination  Difficulty in walking, not elsewhere classified  Rationale for Evaluation and Treatment Rehabilitation  SUBJECTIVE:                                                                                                                                                                                              SUBJECTIVE STATEMENT: No falls, she did a very short drive in her neighborhood this past week and reports that she did okay, she reports that she had some difficulty with the control of the leg and the speed  Pt accompanied by: self  PERTINENT HISTORY: metastatic breast cancer, brain tumor s/p excision with craniotomy; GERD, morbid obesity.  Admitted to rehab ([facility]) 11/17/21 and d/c home 12/09/2021  PAIN:  Are you having pain? No  PRECAUTIONS: Fall and Other: no driving  FALLS: Has patient fallen in last 6 months? 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 Now s/p L frontal crani tumor excision 5/19.  COGNITION: Overall cognitive status: Within functional limits for tasks assessed   SENSATION:  Light touch: WFL  COORDINATION: Decreased coordination RLE-needs assist of LLE or UEs to place RLE   EDEMA:  none  MUSCLE TONE: RLE: Hypotonic   POSTURE: rounded shoulders  LOWER EXTREMITY ROM:     Active  Right Eval Left Eval  Hip flexion    Hip extension    Hip abduction    Hip adduction    Hip internal rotation    Hip external rotation    Knee flexion    Knee extension    Ankle dorsiflexion 0   Ankle plantarflexion 30   Ankle inversion 10   Ankle eversion 5    (Blank rows = not tested)  LOWER EXTREMITY MMT:    MMT Right Eval Left Eval  Hip flexion 3-/5 4/5  Hip extension    Hip abduction 3-/5 4/5  Hip adduction 3+/5 4/5  Hip internal rotation    Hip external rotation    Knee flexion 3-/5 4+/5  Knee extension 3-+5 4+/5  Ankle dorsiflexion 2+ 5/5  Ankle plantarflexion 2   Ankle inversion 2+   Ankle eversion    (Blank rows = not tested)   TRANSFERS: Assistive device utilized: no device  Sit to stand: SBA Stand to sit: SBA  GAIT: Gait pattern: step through pattern, decreased step length- Left, decreased stance time- Right, decreased ankle dorsiflexion- Right, and genu recurvatum- Right decreased control of the right LE especially the foot Distance walked: 60 ft  Assistive device utilized:no device Level of assistance: SBA Going down staris is one at a time  FUNCTIONAL TESTs:  TUG 20 seconds no device 5XSTS = 22 seconds Berg 47/56  Treatment 08/28/22 Walking outdoors Stair training in other Electrical engineer standing [facility] taps on airex STS on airex with ball catch Step overs 6"     08/22/22 Gait around the parking [place] Airex balance beam side stepping and tandem walking Dyna disc ankle motions in sitting and standing Wobble board ankle motions in stiing  and working on control Direction changes Calf stretches Right ankle red to green weight quickly simulating gas and brake Side step on and off airex Leg press right only 20# working on control of the knee 3 ways with foot plate Right ankle calf raise on the leg press 20#   08/14/22 Step ups 6" Calf stretch 30s  Calf raises 2x12 Toe raises 2x12 Step overs 4" Stair training  STS on airex 2x10 Hitting ball standing on airex  Walking on beam Tandem standing on beam 30s  Feet together on foam 30s then eyes closed     08/07/22 Treadmill with incline 3% 1.66mh x639ms  3 way hip 5# 2x10 Calf raises 2x10 Resisted side steps 40#  Fitter pushes RLE 2x10  Stepping over obstacles Leg press 20# 2x10, RLE 20# x10   07/30/22 Elliptical Level 5 x 2 minutes In Pbars hip flexion and hip abduction Toe raise heel raise   PATIENT EDUCATION: Education details: PT POC, Eval results, Person educated: Patient Education method: education Education comprehension: verbalized understanding and returned demonstration   HOME EXERCISE PROGRAM:    GOALS: Goals reviewed with patient? Yes  SHORT TERM GOALS: Target date: 08/14/22  Pt will be independent with HEP for improved strength, balance, gait. Baseline:  Initial HEP from CIR Goal status: INITIAL  LONG TERM GOALS: Target date: 10/28/22  Pt will be independent with HEP for improved strength, balance, transfers, and gait. Baseline:  Initial HEP from CIR Goal status: INITIAL  2.  Pt will improve 5x sit<>stand to less than or equal to 15 sec to demonstrate improved functional strength and transfer efficiency. Baseline: 22 seconds (scores >15 sec indicate fall risk) Goal status: INITIAL  3.  Pt will improve TUG score to less than or equal  to 16 sec for decreased fall risk. Baseline: 20 seconds (indicates increased fall risk and difficulty with ADLs in home) Goal status:ongoing  4.  Increase Berg balance score to 50/56 Baseline:  47/56 Goal status: ongoing  5.  Increase right ankle DF to 8 degress Baseline: 0 degrees Goal status: INITIAL  ASSESSMENT:  CLINICAL IMPRESSION: I pushed patient to go faster with walking and directions changes, she did this well but was fearful.  She is really trying to improve the ankle , I feel that there is a little better movements and control but at first she is very ataxic but with cues she smooths out  OBJECTIVE IMPAIRMENTS Abnormal gait, decreased balance, decreased mobility, difficulty walking, decreased ROM, decreased strength, impaired flexibility, and impaired tone.   ACTIVITY LIMITATIONS standing, transfers, locomotion level, and caring for others  PARTICIPATION LIMITATIONS: meal prep, driving, shopping, community activity, and occupation  PERSONAL FACTORS 3+ comorbidities: see above for PMH  are also affecting patient's functional outcome.   REHAB POTENTIAL: Good  CLINICAL DECISION MAKING: Evolving/moderate complexity  EVALUATION COMPLEXITY: Moderate  PLAN: PT FREQUENCY: 2x/week  PT DURATION: 12 weeks plus eval  PLANNED INTERVENTIONS: Therapeutic exercises, Therapeutic activity, Neuromuscular re-education, Balance training, Gait training, Patient/Family education, Joint mobilization, Orthotic/Fit training, DME instructions, Aquatic Therapy, and Manual therapy  PLAN FOR NEXT SESSION: really push her function and the right LE use  ALBRIGHT,MICHAEL W, PT 08/22/2022, 3:02 PM

## 2022-08-28 ENCOUNTER — Inpatient Hospital Stay: Payer: Medicaid Other | Attending: Hematology and Oncology | Admitting: Hematology and Oncology

## 2022-08-28 ENCOUNTER — Ambulatory Visit: Payer: Medicaid Other | Attending: Neurosurgery

## 2022-08-28 VITALS — BP 136/91 | HR 105 | Temp 97.2°F | Resp 18 | Ht 71.0 in | Wt 303.1 lb

## 2022-08-28 DIAGNOSIS — Z9079 Acquired absence of other genital organ(s): Secondary | ICD-10-CM | POA: Insufficient documentation

## 2022-08-28 DIAGNOSIS — Z90722 Acquired absence of ovaries, bilateral: Secondary | ICD-10-CM | POA: Insufficient documentation

## 2022-08-28 DIAGNOSIS — R278 Other lack of coordination: Secondary | ICD-10-CM | POA: Diagnosis present

## 2022-08-28 DIAGNOSIS — C50412 Malignant neoplasm of upper-outer quadrant of left female breast: Secondary | ICD-10-CM | POA: Diagnosis not present

## 2022-08-28 DIAGNOSIS — C7931 Secondary malignant neoplasm of brain: Secondary | ICD-10-CM | POA: Diagnosis not present

## 2022-08-28 DIAGNOSIS — M6281 Muscle weakness (generalized): Secondary | ICD-10-CM | POA: Diagnosis present

## 2022-08-28 DIAGNOSIS — R2681 Unsteadiness on feet: Secondary | ICD-10-CM | POA: Insufficient documentation

## 2022-08-28 DIAGNOSIS — R2689 Other abnormalities of gait and mobility: Secondary | ICD-10-CM | POA: Insufficient documentation

## 2022-08-28 DIAGNOSIS — M4856XA Collapsed vertebra, not elsewhere classified, lumbar region, initial encounter for fracture: Secondary | ICD-10-CM | POA: Diagnosis not present

## 2022-08-28 DIAGNOSIS — Z9013 Acquired absence of bilateral breasts and nipples: Secondary | ICD-10-CM | POA: Insufficient documentation

## 2022-08-28 DIAGNOSIS — R262 Difficulty in walking, not elsewhere classified: Secondary | ICD-10-CM | POA: Insufficient documentation

## 2022-08-28 DIAGNOSIS — Z170 Estrogen receptor positive status [ER+]: Secondary | ICD-10-CM | POA: Insufficient documentation

## 2022-08-28 NOTE — Assessment & Plan Note (Signed)
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 CT CAP 05/18/22: mild non specific inc in para tracheal LN Nonspecific, stable 5 mm LUL nodule Bone scan: 05/18/22: L2 compression fracture, no bone mets CT chest done 08/22/2022: No evidence of metastatic disease, fatty liver Brain MRI 06/29/2022: New 2 mm metastasis right frontal lobe (SRS on 07/10/2022)    She is able to walk with the help of a cane. Return to clinic in 3 months for follow-up

## 2022-08-29 ENCOUNTER — Telehealth: Payer: Self-pay | Admitting: Hematology and Oncology

## 2022-08-29 NOTE — Telephone Encounter (Signed)
Scheduled appointment per 3/5 los. Patient is aware of the made appointment.

## 2022-09-03 ENCOUNTER — Ambulatory Visit: Payer: Medicaid Other

## 2022-09-03 NOTE — Therapy (Signed)
OUTPATIENT PHYSICAL THERAPY NEURO TREATMENT   Patient Name: Sheryl Mcmillan MRN: [id number] DOB:Nov 02, 1975, 47 y.o., female Today's Date: 09/04/2022   PCP: Windell Hummingbird, PA-C REFERRING PROVIDER: Kathyrn Sheriff, MD   PT End of Session - 09/04/22 1313     Visit Number 6    Number of Visits 27    Date for PT Re-Evaluation 10/26/22    PT Start Time [id number]    PT Stop Time 1355    PT Time Calculation (min) 42 min    Activity Tolerance Patient tolerated treatment well    Behavior During Therapy [facility] for tasks assessed/performed               Past Medical History:  Diagnosis Date   Arthritis    Breast cancer ([place])    Family history of non-Hodgkin's lymphoma 06/01/2020   GERD (gastroesophageal reflux disease)    Hemorrhoids    Migraines    PCOS (polycystic ovarian syndrome)    PONV (postoperative nausea and vomiting)    Past Surgical History:  Procedure Laterality Date   APPLICATION OF CRANIAL NAVIGATION Left 11/10/2021   Procedure: APPLICATION OF CRANIAL NAVIGATION;  Surgeon: Consuella Lose, MD;  Location: [place];  Service: Neurosurgery;  Laterality: Left;   AXILLARY SENTINEL NODE BIOPSY Left 11/07/2020   Procedure: LEFT AXILLARY SENTINEL NODE BIOPSY;  Surgeon: [name], MD;  Location: [place];  Service: General;  Laterality: Left;   BREAST CYST EXCISION Right 09/22/2021   Procedure: Excision of right axillary excess soft tissue.;  Surgeon: [name], MD;  Location: [place];  Service: Plastics;  Laterality: Right;   BREAST RECONSTRUCTION WITH PLACEMENT OF TISSUE EXPANDER AND FLEX HD (ACELLULAR HYDRATED DERMIS) Bilateral 11/07/2020   Procedure: BILATERAL BREAST RECONSTRUCTION WITH PLACEMENT OF TISSUE EXPANDER AND FLEX HD (ACELLULAR HYDRATED DERMIS);  Surgeon: [name], MD;  Location: [place];  Service: Plastics;  Laterality: Bilateral;   COSMETIC SURGERY     CRANIOTOMY Left 11/10/2021   Procedure: LT FRONTAL CRANIOTOMY TUMOR EXCISION;  Surgeon:  Consuella Lose, MD;  Location: [place];  Service: Neurosurgery;  Laterality: Left;   DILATION AND CURETTAGE OF UTERUS     HEMORRHOID SURGERY     IR IMAGING GUIDED PORT INSERTION  06/15/2020   LIPOSUCTION WITH LIPOFILLING Bilateral 09/22/2021   Procedure: Fat grafting bilateral breast;  Surgeon: [name], MD;  Location: [place];  Service: Plastics;  Laterality: Bilateral;   MASS EXCISION Bilateral 04/25/2021   Procedure: excision of bilateral axillary breast tissue;  Surgeon: [name], MD;  Location: [place];  Service: Plastics;  Laterality: Bilateral;   PORT-A-CATH REMOVAL Right 11/07/2020   Procedure: REMOVAL PORT-A-CATH;  Surgeon: [name], MD;  Location: [place];  Service: General;  Laterality: Right;   REMOVAL OF BILATERAL TISSUE EXPANDERS WITH PLACEMENT OF BILATERAL BREAST IMPLANTS Bilateral 04/25/2021   Procedure: REMOVAL OF BILATERAL TISSUE EXPANDERS WITH PLACEMENT OF BILATERAL BREAST IMPLANTS;  Surgeon: [name], MD;  Location: [place];  Service: Plastics;  Laterality: Bilateral;   ROBOTIC ASSISTED TOTAL HYSTERECTOMY WITH BILATERAL SALPINGO OOPHERECTOMY Bilateral 03/14/2021   Procedure: XI ROBOTIC ASSISTED TOTAL HYSTERECTOMY WITH BILATERAL SALPINGO OOPHORECTOMY;  Surgeon: Everitt Amber, MD;  Location[facility];  Service: Gynecology;  Laterality: Bilateral;   TOTAL MASTECTOMY Bilateral 11/07/2020   Procedure: BILATERAL MASTECTOMY;  Surgeon: [name], MD;  Location: [place];  Service: General;  Laterality: Bilateral;   Patient Active Problem List   Diagnosis Date Noted  Brain tumor ([place]) 11/08/2021   Metastatic cancer to brain ([place]) 11/08/2021   Cerebral edema ([place]) 11/08/2021   Complex partial seizure ([place]) 11/08/2021   Brain mass 11/07/2021   Right sided weakness 11/07/2021   IDA (iron deficiency anemia) 07/18/2021   Cholelithiasis 03/14/2021   Peripheral neuropathy due to chemotherapy ([place])  10/20/2020   Morbid obesity with BMI of 40.0-44.9, adult ([place]) 07/19/2020   BRCA1 gene mutation positive 07/04/2020   Genetic testing 06/28/2020   Family history of non-Hodgkin's lymphoma 06/01/2020   Malignant neoplasm of upper-outer quadrant of left breast in female, estrogen receptor positive ([place]) 05/26/2020    ONSET DATE: 12/05/2021 (MD referral)  REFERRING DIAG: D49.6 (ICD-10-CM) - Neoplasm of unspecified behavior of brain  THERAPY DIAG:  Unsteadiness on feet  Muscle weakness (generalized)  Other lack of coordination  Difficulty in walking, not elsewhere classified  Other abnormalities of gait and mobility  Rationale for Evaluation and Treatment Rehabilitation  SUBJECTIVE:                                                                                                                                                                                              SUBJECTIVE STATEMENT: Doing good.   Pt accompanied by: self  PERTINENT HISTORY: metastatic breast cancer, brain tumor s/p excision with craniotomy; GERD, morbid obesity.  Admitted to rehab ([facility]) 11/17/21 and d/c home 12/09/2021  PAIN:  Are you having pain? No  PRECAUTIONS: Fall and Other: no driving  FALLS: Has patient fallen in last 6 months? 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 Now s/p L frontal crani tumor excision 5/19.  COGNITION: Overall cognitive status: Within  functional limits for tasks assessed   SENSATION:  Light touch: WFL  COORDINATION: Decreased coordination RLE-needs assist of LLE or UEs to place RLE   EDEMA:  none  MUSCLE TONE: RLE: Hypotonic   POSTURE: rounded shoulders  LOWER EXTREMITY ROM:     Active Right Eval Left Eval  Hip flexion    Hip extension    Hip abduction    Hip adduction    Hip  internal rotation    Hip external rotation    Knee flexion    Knee extension    Ankle dorsiflexion 0   Ankle plantarflexion 30   Ankle inversion 10   Ankle eversion 5    (Blank rows = not tested)  LOWER EXTREMITY MMT:    MMT Right Eval Left Eval  Hip flexion 3-/5 4/5  Hip extension    Hip abduction 3-/5 4/5  Hip adduction 3+/5 4/5  Hip internal rotation    Hip external rotation    Knee flexion 3-/5 4+/5  Knee extension 3-+5 4+/5  Ankle dorsiflexion 2+ 5/5  Ankle plantarflexion 2   Ankle inversion 2+   Ankle eversion    (Blank rows = not tested)   TRANSFERS: Assistive device utilized: no device  Sit to stand: SBA Stand to sit: SBA  GAIT: Gait pattern: step through pattern, decreased step length- Left, decreased stance time- Right, decreased ankle dorsiflexion- Right, and genu recurvatum- Right decreased control of the right LE especially the foot Distance walked: 60 ft  Assistive device utilized:no device Level of assistance: SBA Going down staris is one at a time  FUNCTIONAL TESTs:  TUG 20 seconds no device 5XSTS = 22 seconds Berg 47/56  Treatment 09/04/22 Bike L3 x89mns with 2 power bursts Stepping in and out of 6" box  Treadmill pushes 30s x3  Small jumps in place x5  Walking with ball toss and direction changes  Leg press 60# 2x10, 20# 2x10  Calf raises on leg press 20# 2x20   08/28/22 Walking outdoors big lap Stair training in other bCatering managerboard standing [facility] taps on airex STS on airex with ball catch 2x10  Step overs 6"    08/22/22 Gait around the parking [place]  balance beam side stepping and tandem walking Dyna disc ankle motions in sitting and standing Wobble board ankle motions in stiing and working on control Direction changes Calf stretches Right ankle red to green weight quickly simulating gas and brake Side step on and off airex Leg press right only 20# working on control of the knee 3 ways with foot plate Right ankle calf raise on the leg press 20#   08/14/22 Step ups 6" Calf stretch 30s  Calf raises 2x12 Toe raises 2x12 Step overs 4" Stair training  STS on airex 2x10 Hitting ball standing on airex  Walking on beam Tandem standing on beam 30s  Feet together on foam 30s then eyes closed     08/07/22 Treadmill with incline 3% 1.571m x6m67m  3 way hip 5# 2x10 Calf raises 2x10 Resisted side steps 40#  Fitter pushes RLE 2x10  Stepping over obstacles Leg press 20# 2x10, RLE 20# x10   07/30/22 Elliptical Level 5 x 2 minutes In Pbars hip flexion and hip abduction Toe raise heel raise   PATIENT EDUCATION: Education details: PT POC, Eval results, Person educated: Patient Education method: education Education comprehension: verbalized understanding and returned demonstration   HOME EXERCISE PROGRAM:    GOALS: Goals reviewed with patient? Yes  SHORT TERM GOALS: Target date: 08/14/22  Pt will be independent with HEP for improved strength, balance, gait. Baseline:  Initial HEP from CIR Goal status: MET  LONG TERM GOALS: Target date: 10/28/22  Pt will be independent with HEP for improved strength, balance, transfers, and gait. Baseline:  Initial HEP from CIR Goal status: INITIAL  2.  Pt will improve 5x sit<>stand to less than or equal to 15 sec to demonstrate improved functional strength  and transfer efficiency. Baseline: 22 seconds (scores >15 sec indicate fall risk) Goal status: INITIAL  3.  Pt will improve TUG score to less than or equal to 16 sec for decreased fall risk. Baseline: 20 seconds (indicates increased  fall risk and difficulty with ADLs in home) Goal status:ongoing  4.  Increase Berg balance score to 50/56 Baseline: 47/56 Goal status: ongoing  5.  Increase right ankle DF to 8 degress Baseline: 0 degrees Goal status: INITIAL  ASSESSMENT:  CLINICAL IMPRESSION: Patient is doing well overall. Continued to work on her functional strength and balance. She wants to be able to do jumping jacks, we tried just small hops in place which she can do small little jumps. Does well with higher level exercises, continue to progress.   OBJECTIVE IMPAIRMENTS Abnormal gait, decreased balance, decreased mobility, difficulty walking, decreased ROM, decreased strength, impaired flexibility, and impaired tone.   ACTIVITY LIMITATIONS standing, transfers, locomotion level, and caring for others  PARTICIPATION LIMITATIONS: meal prep, driving, shopping, community activity, and occupation  PERSONAL FACTORS 3+ comorbidities: see above for PMH  are also affecting patient's functional outcome.   REHAB POTENTIAL: Good  CLINICAL DECISION MAKING: Evolving/moderate complexity  EVALUATION COMPLEXITY: Moderate  PLAN: PT FREQUENCY: 2x/week  PT DURATION: 12 weeks plus eval  PLANNED INTERVENTIONS: Therapeutic exercises, Therapeutic activity, Neuromuscular re-education, Balance training, Gait training, Patient/Family education, Joint mobilization, Orthotic/Fit training, DME instructions, Aquatic Therapy, and Manual therapy  PLAN FOR NEXT SESSION: really push her function and the right LE use  Andris Baumann, PT 09/04/2022, 1:55 PM

## 2022-09-04 ENCOUNTER — Ambulatory Visit: Payer: Medicaid Other

## 2022-09-04 DIAGNOSIS — R2689 Other abnormalities of gait and mobility: Secondary | ICD-10-CM

## 2022-09-04 DIAGNOSIS — R278 Other lack of coordination: Secondary | ICD-10-CM

## 2022-09-04 DIAGNOSIS — R262 Difficulty in walking, not elsewhere classified: Secondary | ICD-10-CM

## 2022-09-04 DIAGNOSIS — M6281 Muscle weakness (generalized): Secondary | ICD-10-CM

## 2022-09-04 DIAGNOSIS — R2681 Unsteadiness on feet: Secondary | ICD-10-CM | POA: Diagnosis not present

## 2022-09-10 ENCOUNTER — Encounter (HOSPITAL_BASED_OUTPATIENT_CLINIC_OR_DEPARTMENT_OTHER): Payer: Self-pay

## 2022-09-10 ENCOUNTER — Emergency Department (HOSPITAL_BASED_OUTPATIENT_CLINIC_OR_DEPARTMENT_OTHER)
Admission: EM | Admit: 2022-09-10 | Discharge: 2022-09-10 | Disposition: A | Discharge status: Home / Self Care | Payer: Medicaid Other | Attending: Emergency Medicine | Admitting: Emergency Medicine

## 2022-09-10 ENCOUNTER — Emergency Department (HOSPITAL_BASED_OUTPATIENT_CLINIC_OR_DEPARTMENT_OTHER): Payer: Medicaid Other

## 2022-09-10 ENCOUNTER — Other Ambulatory Visit: Payer: Self-pay

## 2022-09-10 DIAGNOSIS — Z853 Personal history of malignant neoplasm of breast: Secondary | ICD-10-CM | POA: Diagnosis not present

## 2022-09-10 DIAGNOSIS — R0781 Pleurodynia: Secondary | ICD-10-CM | POA: Insufficient documentation

## 2022-09-10 DIAGNOSIS — R0789 Other chest pain: Secondary | ICD-10-CM

## 2022-09-10 DIAGNOSIS — R079 Chest pain, unspecified: Secondary | ICD-10-CM | POA: Diagnosis present

## 2022-09-10 MED ORDER — NAPROXEN 500 MG PO TABS: 500.0000 mg | ORAL_TABLET | Freq: Two times a day (BID) | ORAL | 0 refills | Status: DC

## 2022-09-10 NOTE — ED Provider Notes (Signed)
[facility] Provider Note   CSN: [id number] Arrival date & time: 09/10/22  1407     History  Chief Complaint  Patient presents with   Sheryl Mcmillan is a 47 y.o. female with a past medical history of migraines, arthritis, breast cancer, GERD presents today for evaluation of right-sided chest pain.  Patient reports falling off bed from sitting position.  She reports she was sitting on the edge of the bed, rolled onto the floor hitting right chest on the floor.  She complains of right sided chest pain.  Denies hitting her head or losing consciousness.  Denies nausea, vomiting, vision changes, shortness of breath.   Fall      Past Medical History:  Diagnosis Date   Arthritis    Breast cancer [facility])    Family history of non-Hodgkin's lymphoma 06/01/2020   GERD (gastroesophageal reflux disease)    Hemorrhoids    Migraines    PCOS (polycystic ovarian syndrome)    PONV (postoperative nausea and vomiting)    Past Surgical History:  Procedure Laterality Date   APPLICATION OF CRANIAL NAVIGATION Left 11/10/2021   Procedure: APPLICATION OF CRANIAL NAVIGATION;  Surgeon: Consuella Lose, MD;  Location: [place];  Service: Neurosurgery;  Laterality: Left;   AXILLARY SENTINEL NODE BIOPSY Left 11/07/2020   Procedure: LEFT AXILLARY SENTINEL NODE BIOPSY;  Surgeon: [name], MD;  Location: [place];  Service: General;  Laterality: Left;   BREAST CYST EXCISION Right 09/22/2021   Procedure: Excision of right axillary excess soft tissue.;  Surgeon: [name], MD;  Location: [place];  Service: Plastics;  Laterality: Right;   BREAST RECONSTRUCTION WITH PLACEMENT OF TISSUE EXPANDER AND FLEX HD (ACELLULAR HYDRATED DERMIS) Bilateral 11/07/2020   Procedure: BILATERAL BREAST RECONSTRUCTION WITH PLACEMENT OF TISSUE EXPANDER AND FLEX HD (ACELLULAR HYDRATED DERMIS);  Surgeon: [name], MD;  Location: [place];  Service:  Plastics;  Laterality: Bilateral;   COSMETIC SURGERY     CRANIOTOMY Left 11/10/2021   Procedure: LT FRONTAL CRANIOTOMY TUMOR EXCISION;  Surgeon: Consuella Lose, MD;  Location: [place];  Service: Neurosurgery;  Laterality: Left;   DILATION AND CURETTAGE OF UTERUS     HEMORRHOID SURGERY     IR IMAGING GUIDED PORT INSERTION  06/15/2020   LIPOSUCTION WITH LIPOFILLING Bilateral 09/22/2021   Procedure: Fat grafting bilateral breast;  Surgeon: [name], MD;  Location: [place];  Service: Plastics;  Laterality: Bilateral;   MASS EXCISION Bilateral 04/25/2021   Procedure: excision of bilateral axillary breast tissue;  Surgeon: [name], MD;  Location: [place];  Service: Plastics;  Laterality: Bilateral;   PORT-A-CATH REMOVAL Right 11/07/2020   Procedure: REMOVAL PORT-A-CATH;  Surgeon: [name], MD;  Location: [place];  Service: General;  Laterality: Right;   REMOVAL OF BILATERAL TISSUE EXPANDERS WITH PLACEMENT OF BILATERAL BREAST IMPLANTS Bilateral 04/25/2021   Procedure: REMOVAL OF BILATERAL TISSUE EXPANDERS WITH PLACEMENT OF BILATERAL BREAST IMPLANTS;  Surgeon: [name], MD;  Location: [place];  Service: Plastics;  Laterality: Bilateral;   ROBOTIC ASSISTED TOTAL HYSTERECTOMY WITH BILATERAL SALPINGO OOPHERECTOMY Bilateral 03/14/2021   Procedure: XI ROBOTIC ASSISTED TOTAL HYSTERECTOMY WITH BILATERAL SALPINGO OOPHORECTOMY;  Surgeon: Everitt Amber, MD;  [facility];  Service: Gynecology;  Laterality: Bilateral;   TOTAL MASTECTOMY Bilateral 11/07/2020   Procedure: BILATERAL MASTECTOMY;  Surgeon: [name], MD;  Location: [place];  Service: General;  Laterality:  Bilateral;     Home Medications Prior to Admission medications   Medication Sig Start Date End Date Taking? Authorizing Provider  LORazepam (ATIVAN) 1 MG tablet Take one po 30 min prior to mri or radiation 07/05/22   Hayden Pedro, PA-C  naproxen  (NAPROSYN) 500 MG tablet Take 1 tablet (500 mg total) by mouth 2 (two) times daily. 09/10/22  Yes Rex Kras, PA  baclofen (LIORESAL) 10 MG tablet Take 1 tablet (10 mg total) by mouth 3 (three) times daily. Patient not taking: Reported on 07/05/2022 05/21/22   Nicholas Lose, MD      Allergies    Carboplatin, Other, and Wound dressing adhesive    Review of Systems   Review of Systems Negative except as per HPI.  Physical Exam Updated Vital Signs BP (!) 138/101 (BP Location: Right Arm)   Pulse (!) 101   Temp 98.5 F (36.9 C) (Oral)   Resp 18   Ht 5\' 11"  (1.803 m)   Wt 136.1 kg   LMP 06/08/2020   SpO2 99%   BMI 41.84 kg/m  Physical Exam Vitals and nursing note reviewed.  Constitutional:      Appearance: Normal appearance.  HENT:     Head: Normocephalic and atraumatic.     Mouth/Throat:     Mouth: Mucous membranes are moist.  Eyes:     General: No scleral icterus. Cardiovascular:     Rate and Rhythm: Normal rate and regular rhythm.     Pulses: Normal pulses.     Heart sounds: Normal heart sounds.  Pulmonary:     Effort: Pulmonary effort is normal.     Breath sounds: Normal breath sounds.  Abdominal:     General: Abdomen is flat.     Palpations: Abdomen is soft.     Tenderness: There is no abdominal tenderness.  Musculoskeletal:        General: No deformity.     Comments: Reproducible tenderness to palpation to right lower rib cage.  Skin:    General: Skin is warm.     Findings: No rash.  Neurological:     General: No focal deficit present.     Mental Status: She is alert.  Psychiatric:        Mood and Affect: Mood normal.     ED Results / Procedures / Treatments   Labs (all labs ordered are listed, but only abnormal results are displayed) Labs Reviewed - No data to display  EKG None  Radiology DG Ribs Unilateral W/Chest Right  Result Date: 09/10/2022 CLINICAL DATA:  fall EXAM: RIGHT RIBS AND CHEST - 3+ VIEW COMPARISON:  06/18/2015, 08/21/2022 FINDINGS: No  fracture or other bone lesions are seen involving the ribs. There is no evidence of pneumothorax or pleural effusion. Both lungs are clear. Heart size and mediastinal contours are within normal limits. Degenerative changes noted throughout the spine. Trachea midline. IMPRESSION: No acute finding by plain radiography. Electronically Signed   By: Jerilynn Mages.  Shick M.D.   On: 09/10/2022 15:01    Procedures Procedures    Medications Ordered in ED Medications - No data to display  ED Course/ Medical Decision Making/ A&P                             Medical Decision Making Amount and/or Complexity of Data Reviewed Radiology: ordered.  Risk Prescription drug management.   This patient presents to the ED for R rib pain, this involves an  extensive number of treatment options, and is a complaint that carries with a high risk of complications and morbidity.  The differential diagnosis includes fracture, dislocation.  This is not an exhaustive list.   Imaging studies: I ordered imaging studies. I personally reviewed, interpreted imaging and agree with the radiologist's interpretations. The results include: Chest x-ray negative for any acute bony abnormalities.  Problem list/ ED course/ Critical interventions/ Medical management: HPI: See above Vital signs within normal range and stable throughout visit. Laboratory/imaging studies significant for: See above. On physical examination, patient is afebrile and appears in no acute distress. Patient presents with R side chest pain. Given history, exam and workup patient likely has musculoskeletal pain. I have low suspicion for fracture, dislocation, significant ligamentous injury, septic arthritis, gout flare. I sent an Rx of naproxen for pain.  Advised patient to follow-up with primary care physician for further evaluation and management, return to the ER if new or worsening symptoms. I have reviewed the patient home medicines and have made adjustments as  needed.  Cardiac monitoring/EKG: The patient was maintained on a cardiac monitor.  I personally reviewed and interpreted the cardiac monitor which showed an underlying rhythm of: sinus rhythm.  Additional history obtained: External records from outside source obtained and reviewed including: Chart review including previous notes, labs, imaging.  Consultations obtained:  Disposition Continued outpatient therapy. Follow-up with PCP recommended for reevaluation of symptoms. Treatment plan discussed with patient.  Pt acknowledged understanding was agreeable to the plan. Worrisome signs and symptoms were discussed with patient, and patient acknowledged understanding to return to the ED if they noticed these signs and symptoms. Patient was stable upon discharge.   This chart was dictated using voice recognition software.  Despite best efforts to proofread,  errors can occur which can change the documentation meaning.          Final Clinical Impression(s) / ED Diagnoses Final diagnoses:  Rib pain  Chest wall pain    Rx / DC Orders ED Discharge Orders          Ordered    naproxen (NAPROSYN) 500 MG tablet  2 times daily        09/10/22 1537              Rex Kras, [place] 09/10/22 1848    Wyvonnia Dusky, MD 09/11/22 1021

## 2022-09-10 NOTE — ED Triage Notes (Signed)
Pt c/o falling off bed from sitting position. Pt reports she was sitting on edge of bed, rolled onto floor hitting right breast on the floor.

## 2022-09-10 NOTE — Discharge Instructions (Addendum)
Please take your medications as prescribed. Take tylenol/ibuprofen for pain. I recommend close follow-up with PCP for reevaluation.  Please do not hesitate to return to emergency department if worrisome signs symptoms we discussed become apparent.  

## 2022-09-11 ENCOUNTER — Telehealth: Payer: Self-pay | Admitting: *Deleted

## 2022-09-11 NOTE — Telephone Encounter (Signed)
Pt called clinic with question regarding her breast implants. States she fell yesterday and landed on her right breast. She was evaluated in the ED and had a chest xray which did not show any rib fractures. She asked if it was possible that the implant was damaged. Discussed with Dr. Marla Roe and advised pt she would need a mammogram and/or [place] to evaluate the integrity of the implant. She could see her PCP for this or we could order it. She states she wants to see how her breast feels over the next few days and she will call us if she needs [place] to place the order. Encouraged her to call back with any changes or concerns. She verbalized understanding.

## 2022-09-12 ENCOUNTER — Telehealth: Payer: Self-pay | Admitting: Hematology and Oncology

## 2022-09-12 ENCOUNTER — Ambulatory Visit: Payer: Medicaid Other | Admitting: Physical Therapy

## 2022-09-12 NOTE — Telephone Encounter (Signed)
Per 3/20 IB reached out to patient to schedule, patient stated she spoke with nurse yesterday and was able to get all her questions answered, She no longer wanted to schedule.

## 2022-09-14 ENCOUNTER — Ambulatory Visit: Payer: Medicaid Other

## 2022-09-14 DIAGNOSIS — R2681 Unsteadiness on feet: Secondary | ICD-10-CM

## 2022-09-14 DIAGNOSIS — M6281 Muscle weakness (generalized): Secondary | ICD-10-CM

## 2022-09-14 DIAGNOSIS — R262 Difficulty in walking, not elsewhere classified: Secondary | ICD-10-CM

## 2022-09-14 DIAGNOSIS — R2689 Other abnormalities of gait and mobility: Secondary | ICD-10-CM

## 2022-09-14 DIAGNOSIS — R278 Other lack of coordination: Secondary | ICD-10-CM

## 2022-09-14 NOTE — Therapy (Signed)
OUTPATIENT PHYSICAL THERAPY NEURO TREATMENT   Patient Name: Sheryl Mcmillan MRN: [id number] DOB:1976/05/24, 47 y.o., female Today's Date: 09/14/2022   PCP: Windell Hummingbird, PA-C REFERRING PROVIDER: Kathyrn Sheriff, MD   PT End of Session - 09/14/22 1102     Visit Number 7    Number of Visits 27    Date for PT Re-Evaluation 10/26/22    PT Start Time 1100    PT Stop Time 1145    PT Time Calculation (min) 45 min    Activity Tolerance Patient tolerated treatment well    Behavior During Therapy [facility] for tasks assessed/performed               Past Medical History:  Diagnosis Date   Arthritis    Breast cancer ([place])    Family history of non-Hodgkin's lymphoma 06/01/2020   GERD (gastroesophageal reflux disease)    Hemorrhoids    Migraines    PCOS (polycystic ovarian syndrome)    PONV (postoperative nausea and vomiting)    Past Surgical History:  Procedure Laterality Date   APPLICATION OF CRANIAL NAVIGATION Left 11/10/2021   Procedure: APPLICATION OF CRANIAL NAVIGATION;  Surgeon: Consuella Lose, MD;  Location: [place];  Service: Neurosurgery;  Laterality: Left;   AXILLARY SENTINEL NODE BIOPSY Left 11/07/2020   Procedure: LEFT AXILLARY SENTINEL NODE BIOPSY;  Surgeon: [name], MD;  Location: [place];  Service: General;  Laterality: Left;   BREAST CYST EXCISION Right 09/22/2021   Procedure: Excision of right axillary excess soft tissue.;  Surgeon: [name], MD;  Location: [place];  Service: Plastics;  Laterality: Right;   BREAST RECONSTRUCTION WITH PLACEMENT OF TISSUE EXPANDER AND FLEX HD (ACELLULAR HYDRATED DERMIS) Bilateral 11/07/2020   Procedure: BILATERAL BREAST RECONSTRUCTION WITH PLACEMENT OF TISSUE EXPANDER AND FLEX HD (ACELLULAR HYDRATED DERMIS);  Surgeon: [name], MD;  Location: [place];  Service: Plastics;  Laterality: Bilateral;   COSMETIC SURGERY     CRANIOTOMY Left 11/10/2021   Procedure: LT FRONTAL CRANIOTOMY TUMOR EXCISION;  Surgeon:  Consuella Lose, MD;  Location: [place];  Service: Neurosurgery;  Laterality: Left;   DILATION AND CURETTAGE OF UTERUS     HEMORRHOID SURGERY     IR IMAGING GUIDED PORT INSERTION  06/15/2020   LIPOSUCTION WITH LIPOFILLING Bilateral 09/22/2021   Procedure: Fat grafting bilateral breast;  Surgeon: [name], MD;  Location: [place];  Service: Plastics;  Laterality: Bilateral;   MASS EXCISION Bilateral 04/25/2021   Procedure: excision of bilateral axillary breast tissue;  Surgeon: [name], MD;  Location: [place];  Service: Plastics;  Laterality: Bilateral;   PORT-A-CATH REMOVAL Right 11/07/2020   Procedure: REMOVAL PORT-A-CATH;  Surgeon: [name], MD;  Location: [place];  Service: General;  Laterality: Right;   REMOVAL OF BILATERAL TISSUE EXPANDERS WITH PLACEMENT OF BILATERAL BREAST IMPLANTS Bilateral 04/25/2021   Procedure: REMOVAL OF BILATERAL TISSUE EXPANDERS WITH PLACEMENT OF BILATERAL BREAST IMPLANTS;  Surgeon: [name], MD;  Location: [place];  Service: Plastics;  Laterality: Bilateral;   ROBOTIC ASSISTED TOTAL HYSTERECTOMY WITH BILATERAL SALPINGO OOPHERECTOMY Bilateral 03/14/2021   Procedure: XI ROBOTIC ASSISTED TOTAL HYSTERECTOMY WITH BILATERAL SALPINGO OOPHORECTOMY;  Surgeon: Everitt Amber, MD;  [facility];  Service: Gynecology;  Laterality: Bilateral;   TOTAL MASTECTOMY Bilateral 11/07/2020   Procedure: BILATERAL MASTECTOMY;  Surgeon: [name], MD;  Location: [place];  Service: General;  Laterality: Bilateral;   Patient Active Problem List   Diagnosis Date Noted  Brain tumor ([place]) 11/08/2021   Metastatic cancer to brain ([place]) 11/08/2021   Cerebral edema ([place]) 11/08/2021   Complex partial seizure ([place]) 11/08/2021   Brain mass 11/07/2021   Right sided weakness 11/07/2021   IDA (iron deficiency anemia) 07/18/2021   Cholelithiasis 03/14/2021   Peripheral neuropathy due to chemotherapy ([place])  10/20/2020   Morbid obesity with BMI of 40.0-44.9, adult ([place]) 07/19/2020   BRCA1 gene mutation positive 07/04/2020   Genetic testing 06/28/2020   Family history of non-Hodgkin's lymphoma 06/01/2020   Malignant neoplasm of upper-outer quadrant of left breast in female, estrogen receptor positive ([place]) 05/26/2020    ONSET DATE: 12/05/2021 (MD referral)  REFERRING DIAG: D49.6 (ICD-10-CM) - Neoplasm of unspecified behavior of brain  THERAPY DIAG:  Unsteadiness on feet  Muscle weakness (generalized)  Other lack of coordination  Difficulty in walking, not elsewhere classified  Other abnormalities of gait and mobility  Rationale for Evaluation and Treatment Rehabilitation  SUBJECTIVE:                                                                                                                                                                                              SUBJECTIVE STATEMENT: I went to my friend's house and I sat on the edge of her bed and ended up rolling off. I was able to get up by myself up but I went to the ER because my chest and ribs were hurting. It is just bruised but not broken  Pt accompanied by: self  PERTINENT HISTORY: metastatic breast cancer, brain tumor s/p excision with craniotomy; GERD, morbid obesity.  Admitted to rehab ([facility]) 11/17/21 and d/c home 12/09/2021  PAIN:  Are you having pain? No  PRECAUTIONS: Fall and Other: no driving  FALLS: Has patient fallen in last 6 months? 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 Now s/p L frontal crani tumor excision 5/19.  COGNITION: Overall cognitive status: Within functional limits for tasks assessed   SENSATION:  Light touch: WFL  COORDINATION: Decreased coordination RLE-needs assist of LLE or UEs to place RLE   EDEMA:  none  MUSCLE TONE: RLE: Hypotonic   POSTURE: rounded shoulders  LOWER EXTREMITY ROM:     Active Right Eval Left Eval  Hip flexion    Hip extension    Hip abduction    Hip adduction    Hip internal rotation    Hip external rotation    Knee flexion    Knee extension    Ankle dorsiflexion 0   Ankle plantarflexion 30   Ankle inversion 10   Ankle eversion 5    (Blank rows = not tested)  LOWER EXTREMITY MMT:    MMT Right Eval Left Eval  Hip flexion 3-/5 4/5  Hip extension    Hip abduction 3-/5 4/5  Hip adduction 3+/5 4/5  Hip internal rotation    Hip external rotation    Knee flexion 3-/5 4+/5  Knee extension 3-+5 4+/5  Ankle dorsiflexion 2+ 5/5  Ankle plantarflexion 2   Ankle inversion 2+   Ankle eversion    (Blank rows = not tested)   TRANSFERS: Assistive device utilized: no device  Sit to stand: SBA Stand to sit: SBA  GAIT: Gait pattern: step through pattern, decreased step length- Left, decreased stance time- Right, decreased ankle dorsiflexion- Right, and genu recurvatum- Right decreased control of the right LE especially the foot Distance walked: 60 ft  Assistive device utilized:no device Level of assistance: SBA Going down staris is one at a time  FUNCTIONAL TESTs:  TUG 20 seconds no device 5XSTS = 22 seconds Berg 47/56  Treatment 09/14/22 Walking on beam forwards and side steps  STS on airex 2x10  Leg ext 35# 2x10  HS curl 45# 2x10 NuStep L6 x6 LE only Lateral step ups 4"    09/04/22 Bike L3 x103mins with 2 power bursts Stepping in and out of 6" box  Treadmill pushes 30s x3  Small  jumps in place x5  Walking with ball toss and direction changes  Leg press 60# 2x10, 20# 2x10  Calf raises on leg press 20# 2x20   08/28/22 Walking outdoors big lap Stair training in other Catering manager board standing [place] taps on airex STS on airex with ball catch 2x10  Step overs 6"    08/22/22 Gait around the parking [place] Airex balance beam side stepping and tandem walking Dyna disc ankle motions in sitting and standing Wobble board ankle motions in stiing and working on control Direction changes Calf stretches Right ankle red to green weight quickly simulating gas and brake Side step on and off airex Leg press right only 20# working on control of the knee 3 ways with foot plate Right ankle calf raise on the leg press 20#   08/14/22 Step ups 6" Calf stretch 30s  Calf raises 2x12 Toe raises 2x12 Step overs 4" Stair training  STS on airex 2x10 Hitting ball standing on airex  Walking on beam Tandem standing on beam 30s  Feet together on foam 30s then eyes closed     08/07/22 Treadmill with incline 3% 1.78mph x61mins  3 way hip 5# 2x10 Calf raises 2x10 Resisted side steps 40#  Fitter pushes RLE 2x10  Stepping over obstacles Leg press 20# 2x10, RLE 20# x10   07/30/22 Elliptical Level 5 x 2 minutes In Pbars hip flexion and hip abduction Toe raise heel raise   PATIENT EDUCATION: Education details: PT POC, Eval results, Person educated: Patient Education method: education Education comprehension: verbalized understanding and returned demonstration   HOME EXERCISE PROGRAM:    GOALS: Goals reviewed with  patient? Yes  SHORT TERM GOALS: Target date: 08/14/22  Pt will be independent with HEP for improved strength, balance, gait. Baseline:  Initial HEP from CIR Goal status: MET  LONG TERM GOALS: Target date: 10/28/22  Pt will be independent with HEP for improved strength, balance, transfers, and gait. Baseline:  Initial HEP from CIR Goal status:  INITIAL  2.  Pt will improve 5x sit<>stand to less than or equal to 15 sec to demonstrate improved functional strength and transfer efficiency. Baseline: 22 seconds (scores >15 sec indicate fall risk) Goal status: INITIAL  3.  Pt will improve TUG score to less than or equal to 16 sec for decreased fall risk. Baseline: 20 seconds (indicates increased fall risk and difficulty with ADLs in home) Goal status:ongoing  4.  Increase Berg balance score to 50/56 Baseline: 47/56 Goal status: ongoing  5.  Increase right ankle DF to 8 degress Baseline: 0 degrees Goal status: INITIAL  ASSESSMENT:  CLINICAL IMPRESSION: Patient returns after having a small accident, she rolled off a bed and landed on her R side. Had some bruising in her R breast and rib. She is still sore and has pain with moving the R arm above head. We worked on more LE strengthening and some balance.   OBJECTIVE IMPAIRMENTS Abnormal gait, decreased balance, decreased mobility, difficulty walking, decreased ROM, decreased strength, impaired flexibility, and impaired tone.   ACTIVITY LIMITATIONS standing, transfers, locomotion level, and caring for others  PARTICIPATION LIMITATIONS: meal prep, driving, shopping, community activity, and occupation  PERSONAL FACTORS 3+ comorbidities: see above for PMH  are also affecting patient's functional outcome.   REHAB POTENTIAL: Good  CLINICAL DECISION MAKING: Evolving/moderate complexity  EVALUATION COMPLEXITY: Moderate  PLAN: PT FREQUENCY: 2x/week  PT DURATION: 12 weeks plus eval  PLANNED INTERVENTIONS: Therapeutic exercises, Therapeutic activity, Neuromuscular re-education, Balance training, Gait training, Patient/Family education, Joint mobilization, Orthotic/Fit training, DME instructions, Aquatic Therapy, and Manual therapy  PLAN FOR NEXT SESSION: really push her function and the right LE use  Andris Baumann, PT 09/14/2022, 11:47 AM

## 2022-09-17 IMAGING — MR MR HEAD WO/W CM
12 of 14 series · 40 of 48 positions shown · IV contrast (gadavist)
Comparison: Brain MRI 11/08/2021

CLINICAL DATA: Brain tumor resection

EXAM:
MRI HEAD WITHOUT AND WITH CONTRAST
TECHNIQUE: Multiplanar, multiecho pulse sequences of the brain and surrounding
structures were obtained without and with intravenous contrast.
CONTRAST:  10 mL Gadavist

[Series 5: DWI · axial · 3.0mm · 0.88mm/px · z∈[-73,+69]mm · 8 of 100 slices shown (1 of 4)]
[im 1/100]
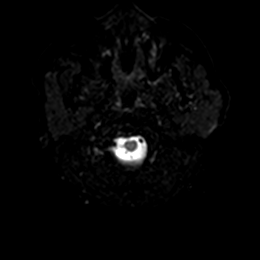
[im 15/100]
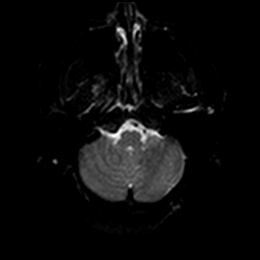
[im 29/100]
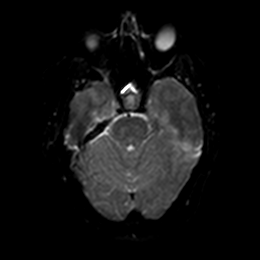
[im 43/100]
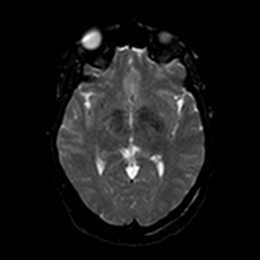
[im 57/100]
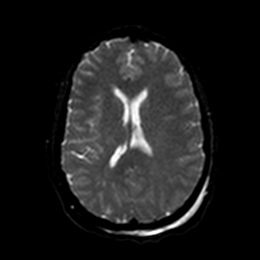
[im 71/100]
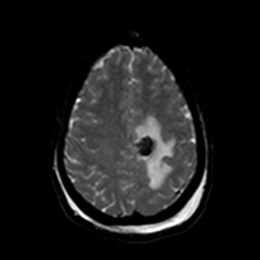
[im 85/100]
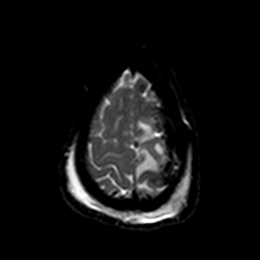
[im 100/100]
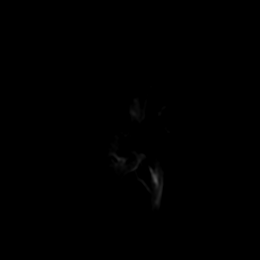

[Series 6: DWI · axial · 3.0mm · 0.88mm/px · z∈[-73,+69]mm · 5 of 50 slices shown (2 of 4)]
[im 1/50]
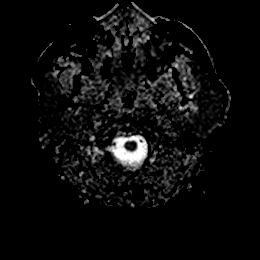
[im 13/50]
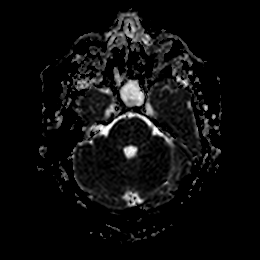
[im 25/50]
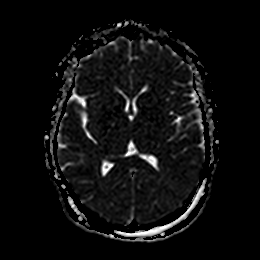
[im 37/50]
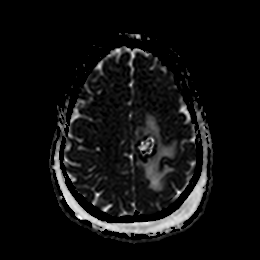
[im 50/50]
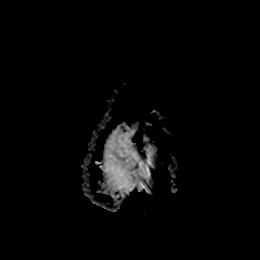

[Series 7: DWI · coronal · 4.0mm · 0.88mm/px · 5 of 64 slices shown (3 of 4)]
[im 1/64]
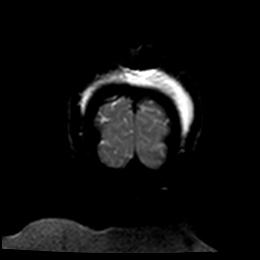
[im 16/64]
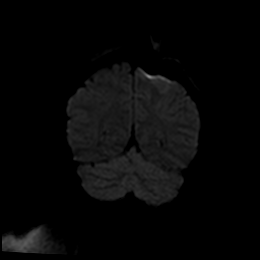
[im 32/64]
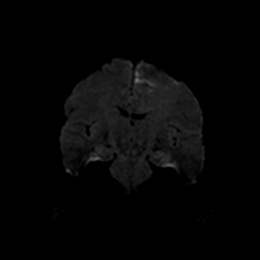
[im 48/64]
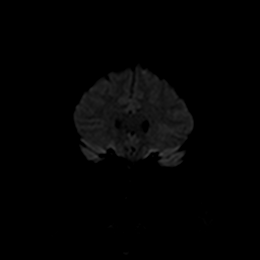
[im 64/64]
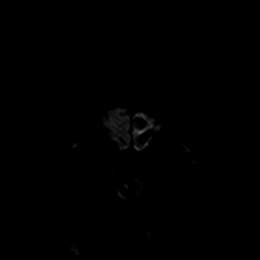

[Series 8: DWI · coronal · 4.0mm · 0.88mm/px · 2 of 32 slices shown (4 of 4)]
[im 1/32]
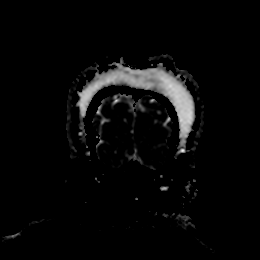
[im 32/32]
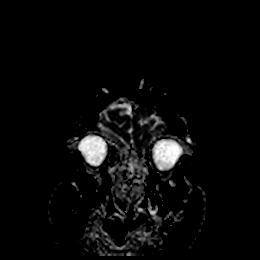

[Series 9: T1 · sagittal · 5.0mm · 0.75mm/px · 2 of 23 slices shown]
[im 1/23]
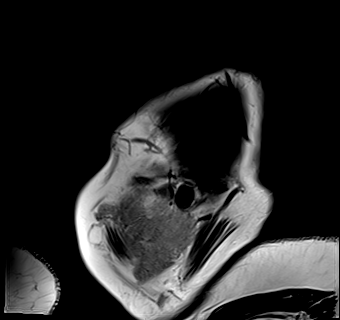
[im 23/23]
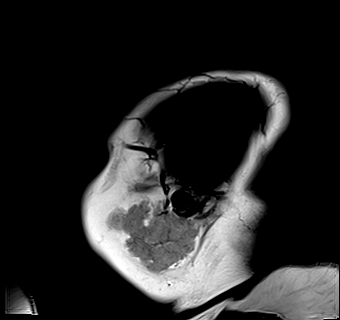

[Series 10: T2 · axial · 5.0mm · 0.72mm/px · z∈[-77,+73]mm · 2 of 27 slices shown]
[im 1/27]
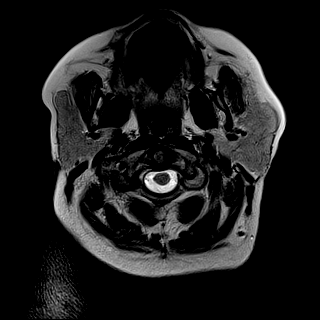
[im 27/27]
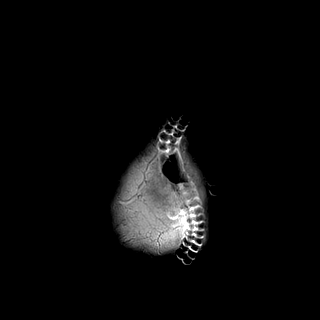

[Series 13: pha_images · axial · 3.0mm · 0.90mm/px · z∈[-86,+85]mm · 4 of 60 slices shown]
[im 1/60]
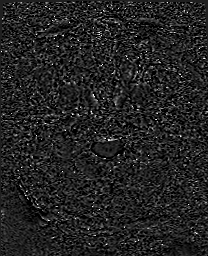
[im 20/60]
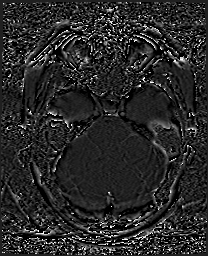
[im 40/60]
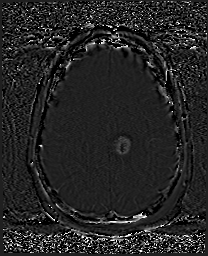
[im 60/60]
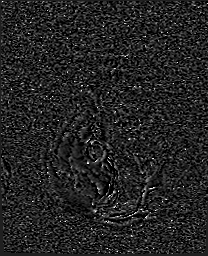

[Series 14: swi_images · axial · 3.0mm · 0.90mm/px · z∈[-86,+85]mm · 4 of 60 slices shown]
[im 1/60]
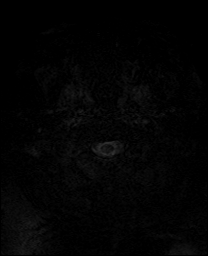
[im 20/60]
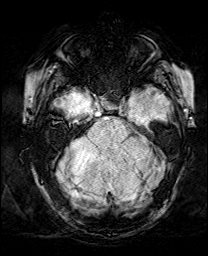
[im 40/60]
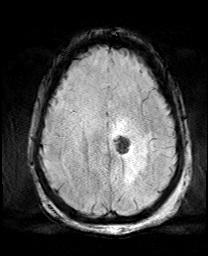
[im 60/60]
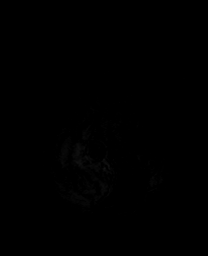

[Series 16: FLAIR · axial · 5.0mm · 0.45mm/px · z∈[-76,+75]mm · 2 of 27 slices shown]
[im 1/27]
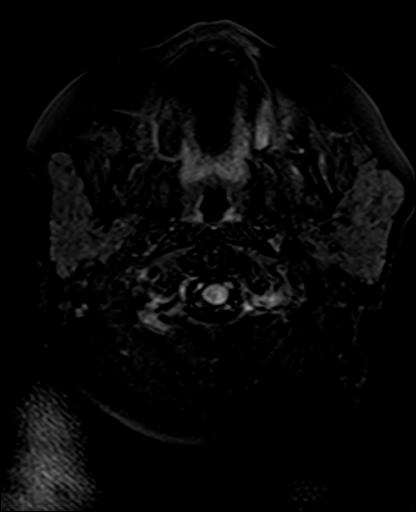
[im 27/27]
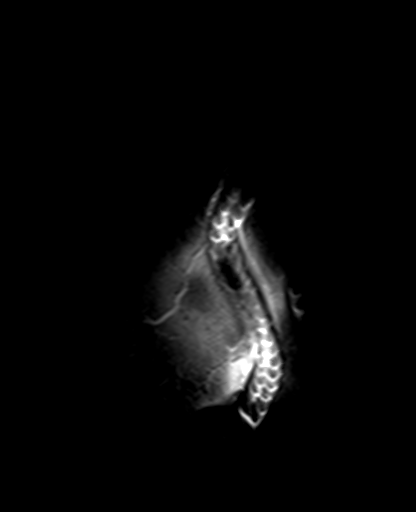

[Series 18: T2 post-contrast · coronal · 5.0mm · 0.72mm/px · 2 of 28 slices shown]
[im 1/28]
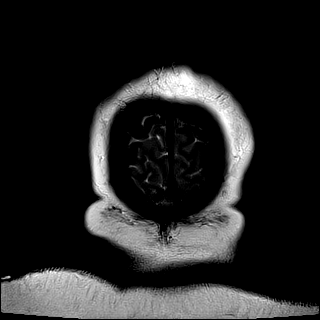
[im 28/28]
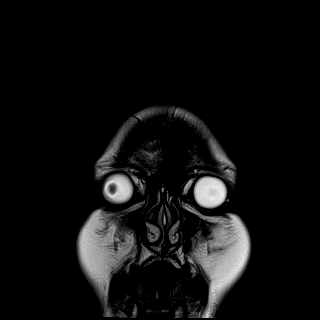

[Series 20: T1 post-contrast · coronal · 5.0mm · 0.34mm/px · 2 of 28 slices shown (1 of 2)]
[im 1/28]
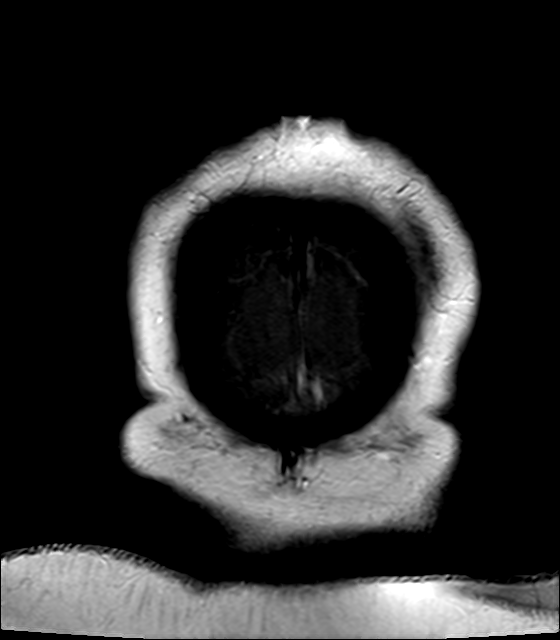
[im 28/28]
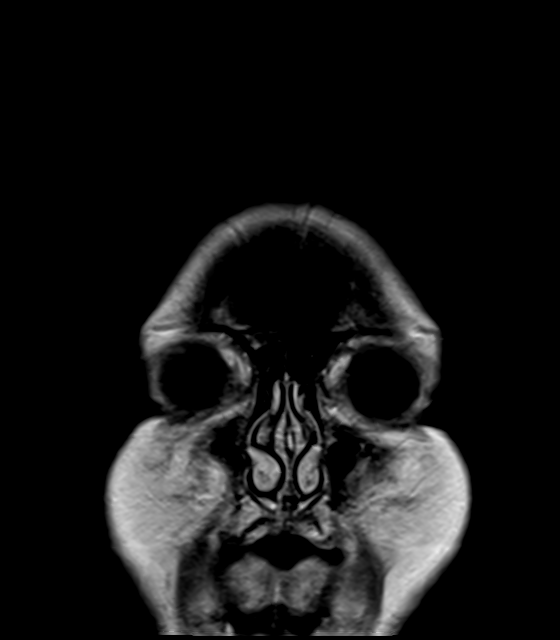

[Series 21: T1 post-contrast · sagittal · 5.0mm · 0.72mm/px · 2 of 23 slices shown (2 of 2)]
[im 1/23]
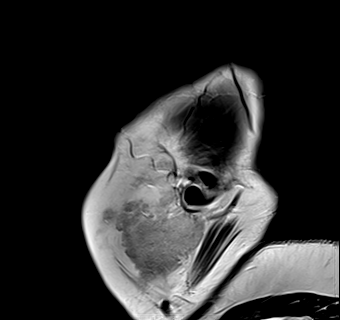
[im 23/23]
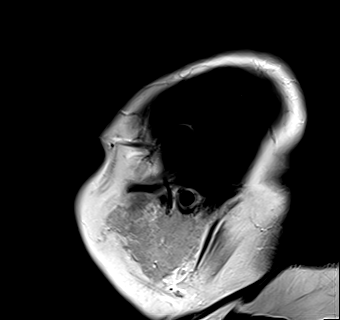

[40 of 48 positions shown; findings below may reference images not displayed]

FINDINGS: Brain: Status post resection of left frontal paramedian mass. There
is mildly decreased surrounding hyperintense T2-weighted signal.
There is no residual nodular contrast enhancement. A small focus of
enhancement along the medial resection margin appears to be
vascular. Minimal blood products. Brain parenchymal signal is
otherwise normal.

Vascular: Major flow voids are preserved.

Skull and upper cervical spine: Left craniotomy.

Sinuses/Orbits:No paranasal sinus fluid levels or advanced mucosal
thickening. No mastoid or middle ear effusion. Normal orbits.
IMPRESSION: Status post resection of left frontal paramedian mass without
residual nodular contrast enhancement. Slightly decreased
surrounding hyperintense T2-weighted signal.

## 2022-09-18 ENCOUNTER — Encounter: Payer: Self-pay | Admitting: Physical Therapy

## 2022-09-18 ENCOUNTER — Ambulatory Visit: Payer: Medicaid Other | Admitting: Physical Therapy

## 2022-09-18 DIAGNOSIS — R278 Other lack of coordination: Secondary | ICD-10-CM

## 2022-09-18 DIAGNOSIS — M6281 Muscle weakness (generalized): Secondary | ICD-10-CM

## 2022-09-18 DIAGNOSIS — R2681 Unsteadiness on feet: Secondary | ICD-10-CM | POA: Diagnosis not present

## 2022-09-18 DIAGNOSIS — R262 Difficulty in walking, not elsewhere classified: Secondary | ICD-10-CM

## 2022-09-18 IMAGING — CT CT CHEST-ABD-PELV W/ CM
2 of 5 series · 14 of 46 positions shown, 16 images · IV contrast (agent unspecified)
Comparison: Previous CT abdomen and pelvis done on 07/04/2021

CLINICAL DATA: History of breast carcinoma, brain metastasis

EXAM:
CT CHEST, ABDOMEN, AND PELVIS WITH CONTRAST
TECHNIQUE: Multidetector CT imaging of the chest, abdomen and pelvis was
performed following the standard protocol during bolus
administration of intravenous contrast.

[Series 3: cap with · axial · 0.98mm/px · z∈[-1147,-547]mm · 11 of 144 slices shown, 13 images]
[im 12/144  soft-tissue]
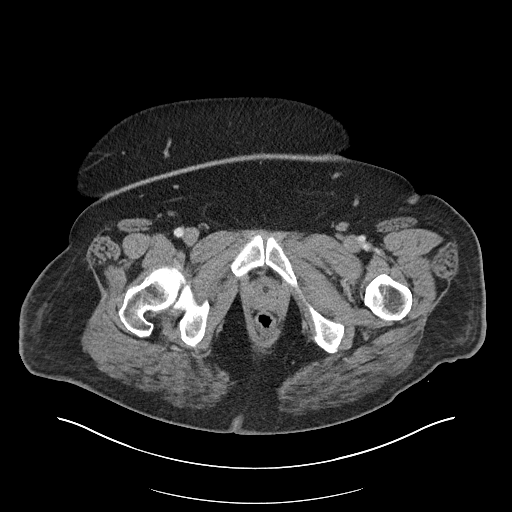
[im 12/144  bone]
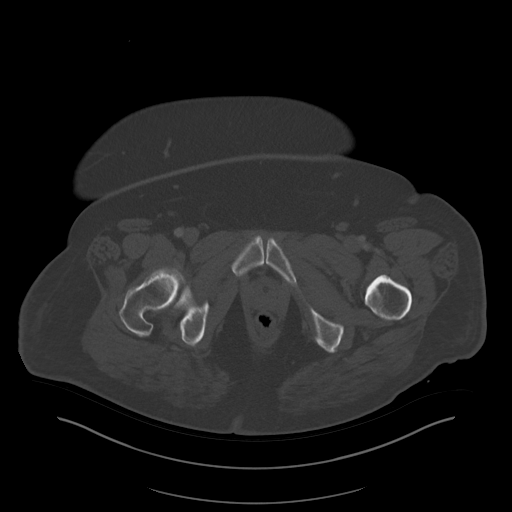
[im 24/144  soft-tissue]
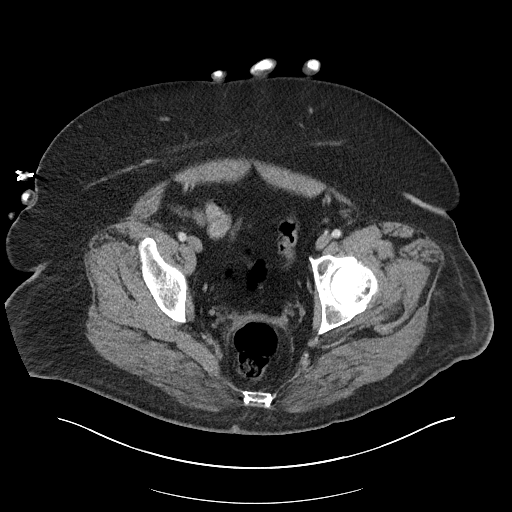
[im 36/144  soft-tissue]
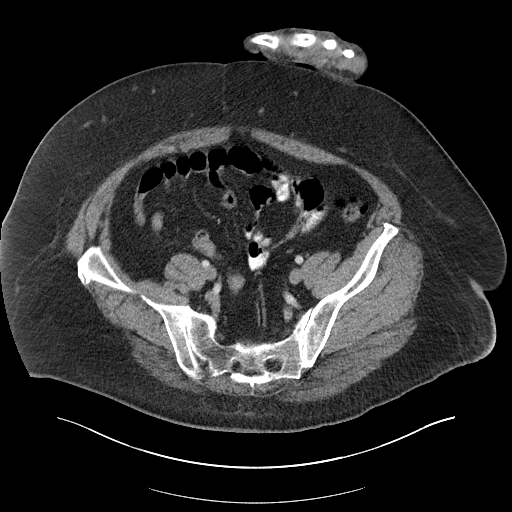
[im 48/144  soft-tissue]
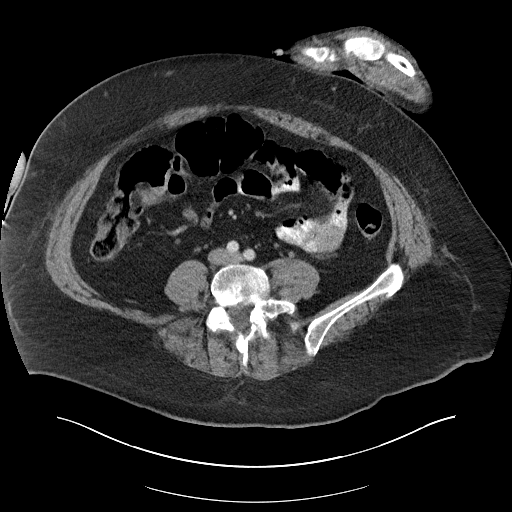
[im 60/144  soft-tissue]
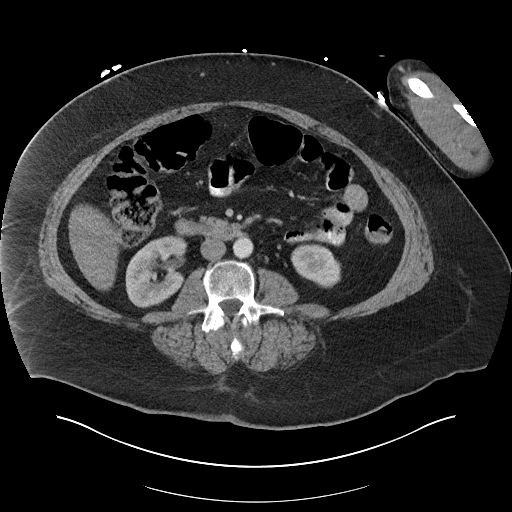
[im 72/144  soft-tissue]
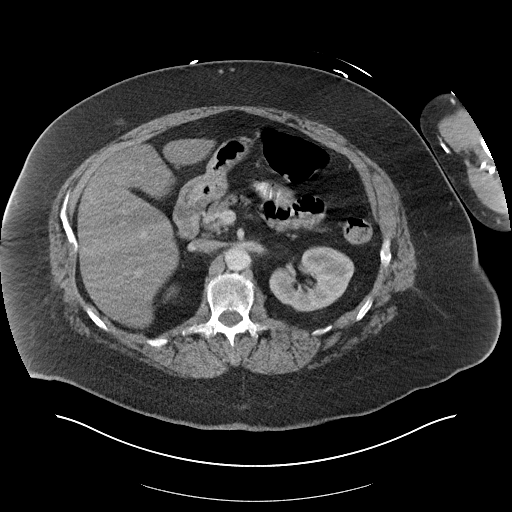
[im 84/144  soft-tissue]
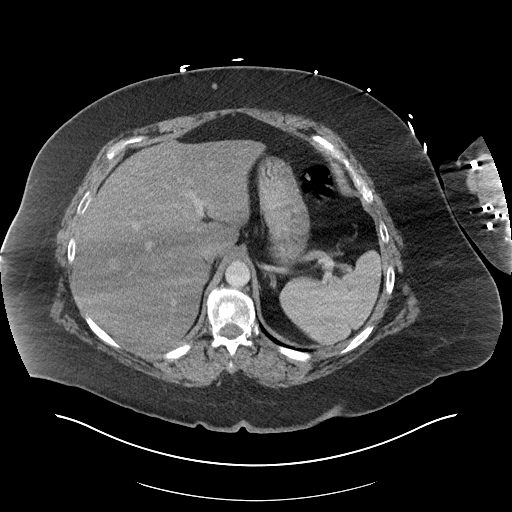
[im 96/144  soft-tissue]
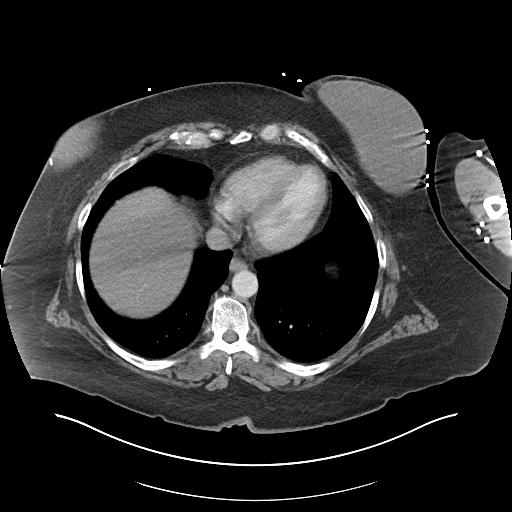
[im 108/144  soft-tissue]
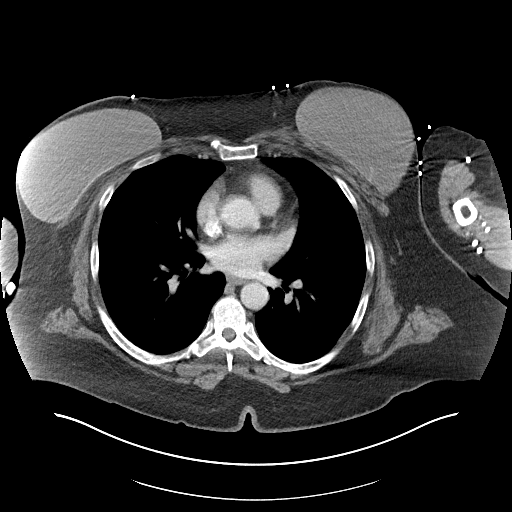
[im 108/144  bone]
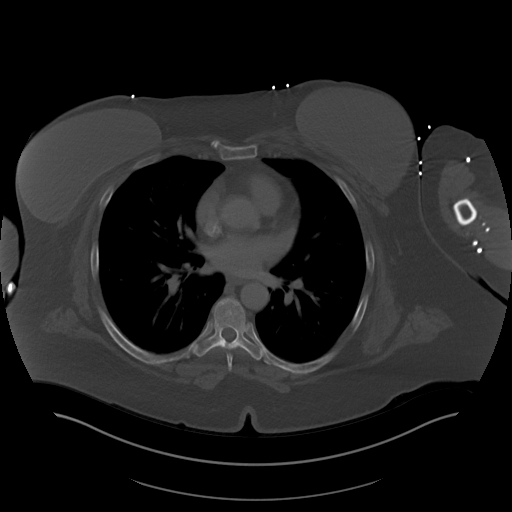
[im 120/144  soft-tissue]
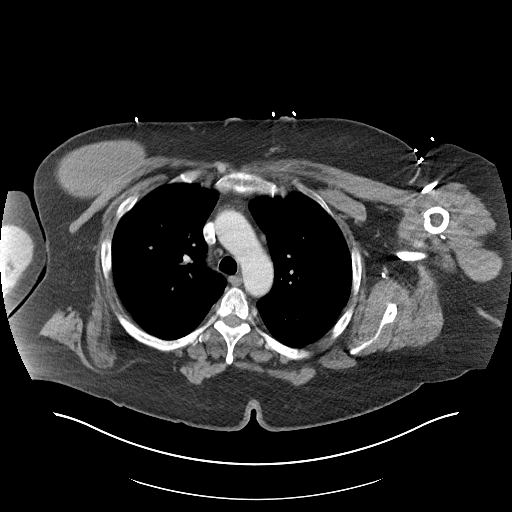
[im 132/144  soft-tissue]
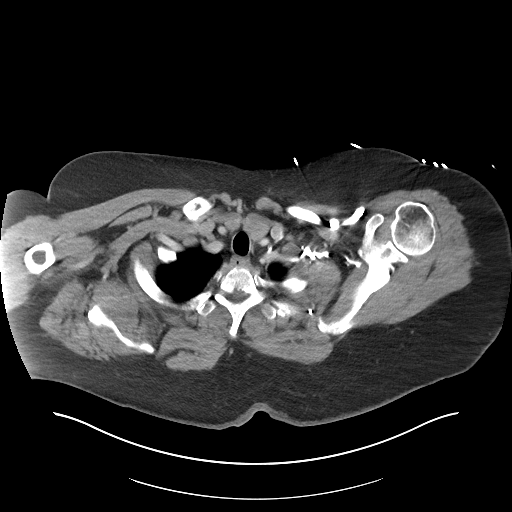

[Series 6: cor · coronal · 1.03mm/px · 3 of 135 slices shown]
[im 45/135  soft-tissue]
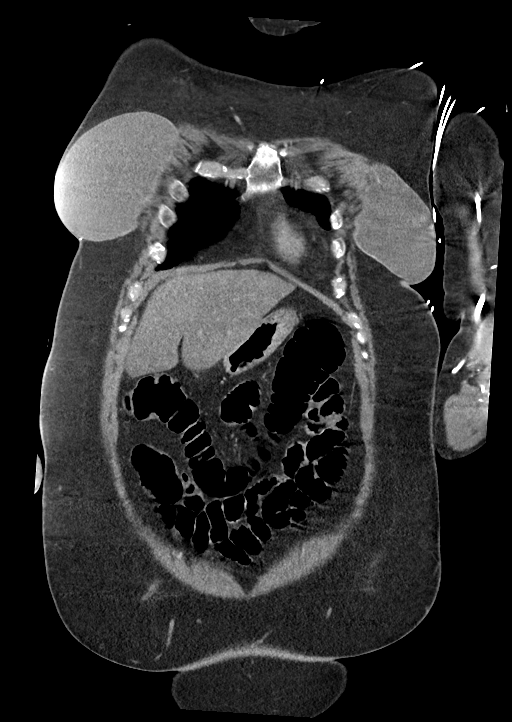
[im 60/135  soft-tissue]
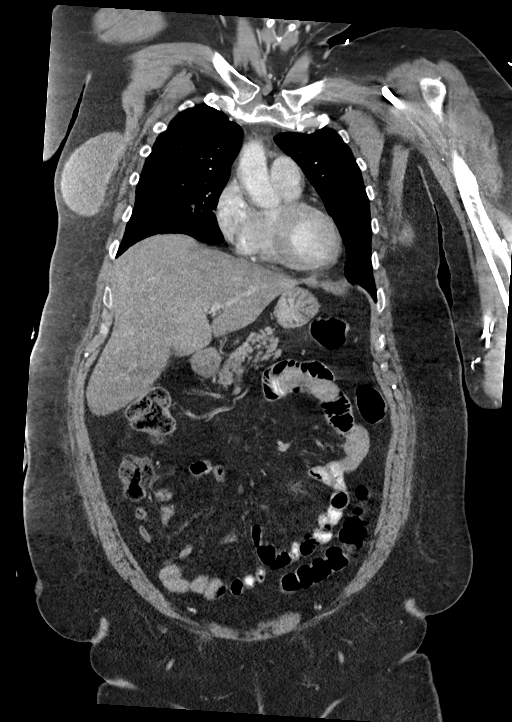
[im 75/135  soft-tissue]
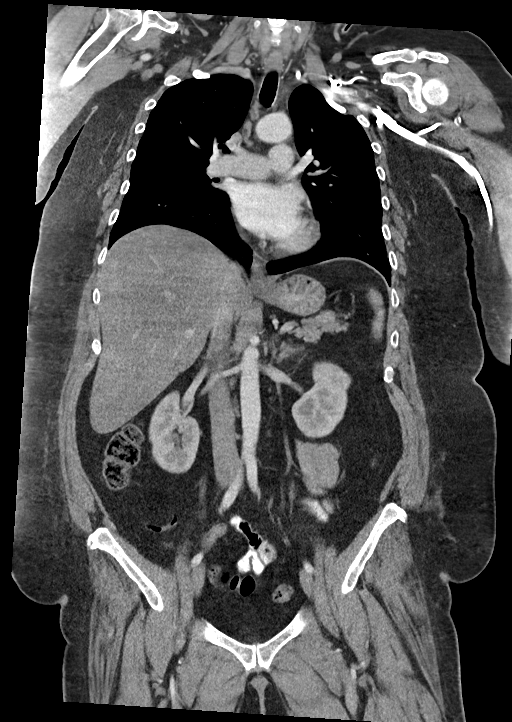

[14 of 46 positions shown; findings below may reference images not displayed]

RADIATION DOSE REDUCTION: This exam was performed according to the
departmental dose-optimization program which includes automated
exposure control, adjustment of the mA and/or kV according to
patient size and/or use of iterative reconstruction technique.

CONTRAST:  100mL OMNIPAQUE IOHEXOL 300 MG/ML  SOLN
FINDINGS: CT CHEST FINDINGS

Cardiovascular: There is homogeneous enhancement in thoracic aorta.
There are no filling defects in the central pulmonary artery
branches.

Mediastinum/Nodes: Subcentimeter nodes are seen in the mediastinum.

Lungs/Pleura: There is no focal pulmonary consolidation. In image 41
of series 5, there is a 3 mm plaque-like lesion in the major fissure
most likely pleural thickening. No discrete parenchymal nodules are
seen in the lung fields. There is no pleural effusion or
pneumothorax.

Musculoskeletal: No focal lytic or sclerotic lesions are seen in the
bony structures. There is presence of reconstruction prostheses in
both breasts.

CT ABDOMEN PELVIS FINDINGS

Hepatobiliary: Liver measures 22 cm in length. There is fatty
infiltration. No focal abnormality is seen. There is no dilation of
bile ducts. Gallbladder is not seen.

Pancreas: No focal abnormality is seen.

Spleen: Unremarkable.

Adrenals/Urinary Tract: Adrenals are not enlarged. There is no
hydronephrosis. There are no renal or ureteral stones. Urinary
bladder is not distended. Foley catheter is seen in the bladder. Air
in the lumen of the bladder may be due to recent catheterization.

Stomach/Bowel: Stomach is unremarkable. Small bowel loops are not
dilated. Appendix is not dilated. There is no wall thickening in
colon.

Vascular/Lymphatic: Vascular structures are unremarkable. There is
no significant lymphadenopathy in the abdomen and pelvis.

Reproductive: Uterus is not seen.  There are no adnexal masses.

Other: There is no ascites or pneumoperitoneum. Umbilical hernia
containing fat is seen. Small bilateral inguinal hernias containing
fat are noted.

Musculoskeletal: No focal lytic or sclerotic lesions are seen in
bony structures. Degenerative changes are noted at L5-S1 level with
disc space narrowing, bony spurs and minimal anterolisthesis.
Spondylolysis is seen in the L5 vertebra.
IMPRESSION: There are no signs of metastatic disease in the chest, abdomen and
pelvis.

There is no focal pulmonary consolidation. There is no pleural
effusion. There is no evidence of intestinal obstruction or
pneumoperitoneum. There is no hydronephrosis.

Enlarged fatty liver.

Other findings as described in the body of the report.

## 2022-09-18 NOTE — Therapy (Signed)
OUTPATIENT PHYSICAL THERAPY NEURO TREATMENT   Patient Name: Sheryl Mcmillan MRN: [id number] DOB:Jun 06, 1976, 47 y.o., female Today's Date: 09/18/2022   PCP: Windell Hummingbird, PA-C REFERRING PROVIDER: Kathyrn Sheriff, MD   PT End of Session - 09/18/22 1019     Visit Number 8    Number of Visits 27    Date for PT Re-Evaluation 10/26/22    PT Start Time 1015    PT Stop Time 1110    PT Time Calculation (min) 55 min    Equipment Utilized During Treatment Gait belt    Activity Tolerance Patient tolerated treatment well    Behavior During Therapy WFL for tasks assessed/performed               Past Medical History:  Diagnosis Date   Arthritis    Breast cancer ([place])    Family history of non-Hodgkin's lymphoma 06/01/2020   GERD (gastroesophageal reflux disease)    Hemorrhoids    Migraines    PCOS (polycystic ovarian syndrome)    PONV (postoperative nausea and vomiting)    Past Surgical History:  Procedure Laterality Date   APPLICATION OF CRANIAL NAVIGATION Left 11/10/2021   Procedure: APPLICATION OF CRANIAL NAVIGATION;  Surgeon: Consuella Lose, MD;  Location: [facility];  Service: Neurosurgery;  Laterality: Left;   AXILLARY SENTINEL NODE BIOPSY Left 11/07/2020   Procedure: LEFT AXILLARY SENTINEL NODE BIOPSY;  Surgeon: [name], MD;  Location: [facility];  Service: General;  Laterality: Left;   BREAST CYST EXCISION Right 09/22/2021   Procedure: Excision of right axillary excess soft tissue.;  Surgeon: [name], MD;  Location: [facility];  Service: Plastics;  Laterality: Right;   BREAST RECONSTRUCTION WITH PLACEMENT OF TISSUE EXPANDER AND FLEX HD (ACELLULAR HYDRATED DERMIS) Bilateral 11/07/2020   Procedure: BILATERAL BREAST RECONSTRUCTION WITH PLACEMENT OF TISSUE EXPANDER AND FLEX HD (ACELLULAR HYDRATED DERMIS);  Surgeon: [name], MD;  Location: [facility];  Service: Plastics;  Laterality: Bilateral;   COSMETIC SURGERY     CRANIOTOMY Left 11/10/2021   Procedure:  LT FRONTAL CRANIOTOMY TUMOR EXCISION;  Surgeon: Consuella Lose, MD;  Location: [facility];  Service: Neurosurgery;  Laterality: Left;   DILATION AND CURETTAGE OF UTERUS     HEMORRHOID SURGERY     IR IMAGING GUIDED PORT INSERTION  06/15/2020   LIPOSUCTION WITH LIPOFILLING Bilateral 09/22/2021   Procedure: Fat grafting bilateral breast;  Surgeon: [name], MD;  Location: [facility];  Service: Plastics;  Laterality: Bilateral;   MASS EXCISION Bilateral 04/25/2021   Procedure: excision of bilateral axillary breast tissue;  Surgeon: [name], MD;  Location: [facility];  Service: Plastics;  Laterality: Bilateral;   PORT-A-CATH REMOVAL Right 11/07/2020   Procedure: REMOVAL PORT-A-CATH;  Surgeon: [name], MD;  Location: [facility];  Service: General;  Laterality: Right;   REMOVAL OF BILATERAL TISSUE EXPANDERS WITH PLACEMENT OF BILATERAL BREAST IMPLANTS Bilateral 04/25/2021   Procedure: REMOVAL OF BILATERAL TISSUE EXPANDERS WITH PLACEMENT OF BILATERAL BREAST IMPLANTS;  Surgeon: [name], MD;  Location: [facility];  Service: Plastics;  Laterality: Bilateral;   ROBOTIC ASSISTED TOTAL HYSTERECTOMY WITH BILATERAL SALPINGO OOPHERECTOMY Bilateral 03/14/2021   Procedure: XI ROBOTIC ASSISTED TOTAL HYSTERECTOMY WITH BILATERAL SALPINGO OOPHORECTOMY;  Surgeon: Everitt Amber, MD;  Location: [facility];  Service: Gynecology;  Laterality: Bilateral;   TOTAL MASTECTOMY Bilateral 11/07/2020   Procedure: BILATERAL MASTECTOMY;  Surgeon: [name], MD;  Location: [facility];  Service: General;  Laterality: Bilateral;  Patient Active Problem List   Diagnosis Date Noted   Brain tumor ([place]) 11/08/2021   Metastatic cancer to brain ([place]) 11/08/2021   Cerebral edema ([place]) 11/08/2021   Complex partial seizure ([place]) 11/08/2021   Brain mass 11/07/2021   Right sided weakness 11/07/2021   IDA (iron deficiency anemia) 07/18/2021   Cholelithiasis 03/14/2021    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/20/2020   Morbid obesity with BMI of 40.0-44.9, adult ([place]) 07/19/2020   BRCA1 gene mutation positive 07/04/2020   Genetic testing 06/28/2020   Family history of non-Hodgkin's lymphoma 06/01/2020   Malignant neoplasm of upper-outer quadrant of left breast in female, estrogen receptor positive ([place]) 05/26/2020    ONSET DATE: 12/05/2021 (MD referral)  REFERRING DIAG: D49.6 (ICD-10-CM) - Neoplasm of unspecified behavior of brain  THERAPY DIAG:  Unsteadiness on feet  Muscle weakness (generalized)  Other lack of coordination  Difficulty in walking, not elsewhere classified  Rationale for Evaluation and Treatment Rehabilitation  SUBJECTIVE:                                                                                                                                                                                              SUBJECTIVE STATEMENT: Patient reports that she is still recovering from falling over a week ago, reports that she is doing okay, the fall was a slip off of the bed Pt accompanied by: self  PERTINENT HISTORY: metastatic breast cancer, brain tumor s/p excision with craniotomy; GERD, morbid obesity.  Admitted to rehab ([facility]) 11/17/21 and d/c home 12/09/2021  PAIN:  Are you having pain? No  PRECAUTIONS: Fall and Other: no driving  FALLS: Has patient fallen in last 6 months? 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 Now s/p L frontal crani tumor excision 5/19.  COGNITION: Overall cognitive status: Within functional limits for tasks assessed   SENSATION:  Light touch: WFL  COORDINATION: Decreased coordination RLE-needs assist of LLE or UEs to place RLE   EDEMA:  none  MUSCLE TONE: RLE: Hypotonic   POSTURE: rounded shoulders  LOWER EXTREMITY ROM:  Active Right Eval Left Eval  Hip flexion    Hip extension    Hip abduction    Hip adduction    Hip internal rotation    Hip external rotation    Knee flexion    Knee extension    Ankle dorsiflexion 0   Ankle plantarflexion 30   Ankle inversion 10   Ankle eversion 5    (Blank rows = not tested)  LOWER EXTREMITY MMT:    MMT Right Eval Left Eval  Hip flexion 3-/5 4/5  Hip extension    Hip abduction 3-/5 4/5  Hip adduction 3+/5 4/5  Hip internal rotation    Hip external rotation    Knee flexion 3-/5 4+/5  Knee extension 3-+5 4+/5  Ankle dorsiflexion 2+ 5/5  Ankle plantarflexion 2   Ankle inversion 2+   Ankle eversion    (Blank rows = not tested)   TRANSFERS: Assistive device utilized: no device  Sit to stand: SBA Stand to sit: SBA  GAIT: Gait pattern: step through pattern, decreased step length- Left, decreased stance time- Right, decreased ankle dorsiflexion- Right, and genu recurvatum- Right decreased control of the right LE especially the foot Distance walked: 60 ft  Assistive device utilized:no device Level of assistance: SBA Going down staris is one at a time  FUNCTIONAL TESTs:  TUG 20 seconds no device 5XSTS = 22 seconds Berg 47/56  Treatment 09/18/22 Bike level 3 x 6 minutes Gait around the back building a large lap and a small lap she got short of breath twice and had one very short rest while standing 35# HS curls 2x15 Leg extension 25# 2x15 On airex 5# straight arm pulls 20# seated row 20# lats Doorways stretch 3 ways for opening the chest One more large lap  around the back building  09/14/22 Walking on beam forwards and side steps  STS on airex 2x10  Leg ext 35# 2x10  HS curl 45# 2x10 NuStep L6 x6 LE only Lateral step ups 4"    09/04/22 Bike L3 x38mins with 2 power bursts Stepping in and out of 6" box  Treadmill pushes 30s x3  Small jumps in place x5  Walking with ball toss and direction changes  Leg press 60# 2x10, 20# 2x10  Calf raises on leg press 20# 2x20   08/28/22 Walking outdoors big lap Stair training in other Catering manager board standing [place] taps on airex STS on airex with ball catch 2x10  Step overs 6"    08/22/22 Gait around the parking [place] Airex balance beam side stepping and tandem walking Dyna disc ankle motions in sitting and standing Wobble board ankle motions in stiing and working on control Direction changes Calf stretches Right ankle red to green weight quickly simulating gas and brake Side step on and off airex Leg press right only 20# working on control of the knee 3 ways with foot plate Right ankle calf raise on the leg press 20#   08/14/22 Step ups 6" Calf stretch 30s  Calf raises 2x12 Toe raises 2x12 Step overs 4" Stair training  STS on airex 2x10 Hitting ball standing on airex  Walking on beam Tandem standing on beam 30s  Feet together on foam 30s then eyes closed   PATIENT EDUCATION: Education details: PT POC, Eval results, Person educated: Patient Education method: education Education comprehension: verbalized understanding and returned demonstration   HOME EXERCISE PROGRAM:  GOALS: Goals reviewed with patient? Yes  SHORT TERM GOALS: Target date: 08/14/22  Pt will  be independent with HEP for improved strength, balance, gait. Baseline:  Initial HEP from [facility] Goal status: MET  LONG TERM GOALS: Target date: 10/28/22  Pt will be independent with HEP for improved strength, balance, transfers, and gait. Baseline:  Initial HEP from [facility] Goal status: progressing 09/18/22  2.  Pt  will improve 5x sit<>stand to less than or equal to 15 sec to demonstrate improved functional strength and transfer efficiency. Baseline: 22 seconds (scores >15 sec indicate fall risk) Goal status: INITIAL  3.  Pt will improve TUG score to less than or equal to 16 sec for decreased fall risk. Baseline: 20 seconds (indicates increased fall risk and difficulty with ADLs in home) Goal status:ongoing  4.  Increase Berg balance score to 50/56 Baseline: 47/56 Goal status: ongoing  5.  Increase right ankle DF to 8 degress Baseline: 0 degrees Goal status: INITIAL  ASSESSMENT:  CLINICAL IMPRESSION: Patient continues to have right breast pain, this brought up that after her mastectomy and subsequent breast reconstruction that she did not get to do PT due to the brain surgery.  I gave her a few ways to do chest stretches in the doorway and we performed, she reported that this was feeling good.  I also increased the walking as she is planning a trip where she may have to walk a lot at the mid to late April.  OBJECTIVE IMPAIRMENTS Abnormal gait, decreased balance, decreased mobility, difficulty walking, decreased ROM, decreased strength, impaired flexibility, and impaired tone.   ACTIVITY LIMITATIONS standing, transfers, locomotion level, and caring for others  PARTICIPATION LIMITATIONS: meal prep, driving, shopping, community activity, and occupation  PERSONAL FACTORS 3+ comorbidities: see above for PMH  are also affecting patient's functional outcome.   REHAB POTENTIAL: Good  CLINICAL DECISION MAKING: Evolving/moderate complexity  EVALUATION COMPLEXITY: Moderate  PLAN: PT FREQUENCY: 2x/week  PT DURATION: 12 weeks plus eval  PLANNED INTERVENTIONS: Therapeutic exercises, Therapeutic activity, Neuromuscular re-education, Balance training, Gait training, Patient/Family education, Joint mobilization, Orthotic/Fit training, DME instructions, Aquatic Therapy, and Manual therapy  PLAN FOR NEXT  SESSION: May see another 3-4 weeks to maximize her function  Sumner Boast, PT 09/18/2022, 10:19 AM

## 2022-09-24 ENCOUNTER — Ambulatory Visit: Payer: Medicaid Other | Attending: Neurosurgery | Admitting: Physical Therapy

## 2022-09-24 ENCOUNTER — Encounter: Payer: Self-pay | Admitting: Physical Therapy

## 2022-09-24 DIAGNOSIS — R2689 Other abnormalities of gait and mobility: Secondary | ICD-10-CM | POA: Diagnosis present

## 2022-09-24 DIAGNOSIS — R278 Other lack of coordination: Secondary | ICD-10-CM | POA: Diagnosis present

## 2022-09-24 DIAGNOSIS — R262 Difficulty in walking, not elsewhere classified: Secondary | ICD-10-CM

## 2022-09-24 DIAGNOSIS — R2681 Unsteadiness on feet: Secondary | ICD-10-CM

## 2022-09-24 DIAGNOSIS — M6281 Muscle weakness (generalized): Secondary | ICD-10-CM | POA: Diagnosis not present

## 2022-09-24 NOTE — Therapy (Signed)
OUTPATIENT PHYSICAL THERAPY NEURO TREATMENT   Patient Name: Sheryl Mcmillan MRN: [id number] DOB:31-Dec-1975, 47 y.o., female Today's Date: 09/24/2022   PCP: Windell Hummingbird, PA-C REFERRING PROVIDER: Kathyrn Sheriff, MD   PT End of Session - 09/24/22 1101     Visit Number 9    Date for PT Re-Evaluation 10/26/22    PT Start Time 1100    PT Stop Time 1145    PT Time Calculation (min) 45 min    Activity Tolerance Patient tolerated treatment well    Behavior During Therapy [facility] for tasks assessed/performed               Past Medical History:  Diagnosis Date   Arthritis    Breast cancer    Family history of non-Hodgkin's lymphoma 06/01/2020   GERD (gastroesophageal reflux disease)    Hemorrhoids    Migraines    PCOS (polycystic ovarian syndrome)    PONV (postoperative nausea and vomiting)    Past Surgical History:  Procedure Laterality Date   APPLICATION OF CRANIAL NAVIGATION Left 11/10/2021   Procedure: APPLICATION OF CRANIAL NAVIGATION;  Surgeon: Consuella Lose, MD;  Location: [place];  Service: Neurosurgery;  Laterality: Left;   AXILLARY SENTINEL NODE BIOPSY Left 11/07/2020   Procedure: LEFT AXILLARY SENTINEL NODE BIOPSY;  Surgeon: [name], MD;  Location: [place];  Service: General;  Laterality: Left;   BREAST CYST EXCISION Right 09/22/2021   Procedure: Excision of right axillary excess soft tissue.;  Surgeon: [name], MD;  Location: [place];  Service: Plastics;  Laterality: Right;   BREAST RECONSTRUCTION WITH PLACEMENT OF TISSUE EXPANDER AND FLEX HD (ACELLULAR HYDRATED DERMIS) Bilateral 11/07/2020   Procedure: BILATERAL BREAST RECONSTRUCTION WITH PLACEMENT OF TISSUE EXPANDER AND FLEX HD (ACELLULAR HYDRATED DERMIS);  Surgeon: [name], MD;  Location: [place];  Service: Plastics;  Laterality: Bilateral;   COSMETIC SURGERY     CRANIOTOMY Left 11/10/2021   Procedure: LT FRONTAL CRANIOTOMY TUMOR EXCISION;  Surgeon: Consuella Lose, MD;   Location: [place];  Service: Neurosurgery;  Laterality: Left;   DILATION AND CURETTAGE OF UTERUS     HEMORRHOID SURGERY     IR IMAGING GUIDED PORT INSERTION  06/15/2020   LIPOSUCTION WITH LIPOFILLING Bilateral 09/22/2021   Procedure: Fat grafting bilateral breast;  Surgeon: [name], MD;  Location: [place];  Service: Plastics;  Laterality: Bilateral;   MASS EXCISION Bilateral 04/25/2021   Procedure: excision of bilateral axillary breast tissue;  Surgeon: [name], MD;  Location: [place];  Service: Plastics;  Laterality: Bilateral;   PORT-A-CATH REMOVAL Right 11/07/2020   Procedure: REMOVAL PORT-A-CATH;  Surgeon: [name], MD;  Location: [place];  Service: General;  Laterality: Right;   REMOVAL OF BILATERAL TISSUE EXPANDERS WITH PLACEMENT OF BILATERAL BREAST IMPLANTS Bilateral 04/25/2021   Procedure: REMOVAL OF BILATERAL TISSUE EXPANDERS WITH PLACEMENT OF BILATERAL BREAST IMPLANTS;  Surgeon: [name], MD;  Location: [place];  Service: Plastics;  Laterality: Bilateral;   ROBOTIC ASSISTED TOTAL HYSTERECTOMY WITH BILATERAL SALPINGO OOPHERECTOMY Bilateral 03/14/2021   Procedure: XI ROBOTIC ASSISTED TOTAL HYSTERECTOMY WITH BILATERAL SALPINGO OOPHORECTOMY;  Surgeon: Everitt Amber, MD;  Location: [facility];  Service: Gynecology;  Laterality: Bilateral;   TOTAL MASTECTOMY Bilateral 11/07/2020   Procedure: BILATERAL MASTECTOMY;  Surgeon: [name], MD;  Location: [place];  Service: General;  Laterality: Bilateral;   Patient Active Problem List   Diagnosis Date Noted   Brain tumor 11/08/2021   Metastatic  cancer to brain 11/08/2021   Cerebral edema 11/08/2021   Complex partial seizure 11/08/2021   Brain mass 11/07/2021   Right sided weakness 11/07/2021   IDA (iron deficiency anemia) 07/18/2021   Cholelithiasis 03/14/2021   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   Morbid obesity with BMI of 40.0-44.9, adult  07/19/2020   BRCA1 gene mutation positive 07/04/2020   Genetic testing 06/28/2020   Family history of non-Hodgkin's lymphoma 06/01/2020   Malignant neoplasm of upper-outer quadrant of left breast in female, estrogen receptor positive 05/26/2020    ONSET DATE: 12/05/2021 (MD referral)  REFERRING DIAG: D49.6 (ICD-10-CM) - Neoplasm of unspecified behavior of brain  THERAPY DIAG:  Muscle weakness (generalized)  Unsteadiness on feet  Other lack of coordination  Difficulty in walking, not elsewhere classified  Other abnormalities of gait and mobility  Rationale for Evaluation and Treatment Rehabilitation  SUBJECTIVE:                                                                                                                                                                                              SUBJECTIVE STATEMENT: "Im good" "Sleepy" Pt accompanied by: self  PERTINENT HISTORY: metastatic breast cancer, brain tumor s/p excision with craniotomy; GERD, morbid obesity.  Admitted to rehab ([facility]) 11/17/21 and d/c home 12/09/2021  PAIN:  Are you having pain? No  PRECAUTIONS: Fall and Other: no driving  FALLS: Has patient fallen in last 6 months? 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 Now s/p L frontal crani tumor excision 5/19.  COGNITION: Overall cognitive status: Within functional limits for tasks assessed   SENSATION:  Light touch:  WFL  COORDINATION: Decreased coordination RLE-needs assist of LLE or UEs to place RLE   EDEMA:  none  MUSCLE TONE: RLE: Hypotonic   POSTURE: rounded shoulders  LOWER EXTREMITY ROM:     Active Right Eval Left Eval  Hip flexion    Hip extension    Hip abduction    Hip adduction    Hip internal rotation    Hip external rotation    Knee flexion  Knee extension    Ankle dorsiflexion 0   Ankle plantarflexion 30   Ankle inversion 10   Ankle eversion 5    (Blank rows = not tested)  LOWER EXTREMITY MMT:    MMT Right Eval Left Eval  Hip flexion 3-/5 4/5  Hip extension    Hip abduction 3-/5 4/5  Hip adduction 3+/5 4/5  Hip internal rotation    Hip external rotation    Knee flexion 3-/5 4+/5  Knee extension 3-+5 4+/5  Ankle dorsiflexion 2+ 5/5  Ankle plantarflexion 2   Ankle inversion 2+   Ankle eversion    (Blank rows = not tested)   TRANSFERS: Assistive device utilized: no device  Sit to stand: SBA Stand to sit: SBA  GAIT: Gait pattern: step through pattern, decreased step length- Left, decreased stance time- Right, decreased ankle dorsiflexion- Right, and genu recurvatum- Right decreased control of the right LE especially the foot Distance walked: 60 ft  Assistive device utilized:no device Level of assistance: SBA Going down staris is one at a time  FUNCTIONAL TESTs:  TUG 20 seconds no device 5XSTS = 22 seconds Berg 47/56  Treatment 09/24/22 NuStep L5 x6 Gait around the back building a large lap and a small lap she got short of breath twice and had one very short rest while standing 35# HS curls 2x15 Leg extension 25# 2x15 Rows 15lb 2x10 Side step on and of airex  Standing on airex shoulder Ext 10lb 2x10   09/18/22 Bike level 3 x 6 minutes Gait around the back building a large lap and a small lap she got short of breath twice and had one very short rest while standing 35# HS curls 2x15 Leg extension 25# 2x15 On airex 5# straight arm  pulls 20# seated row 20# lats Doorways stretch 3 ways for opening the chest One more large lap around the back building  09/14/22 Walking on beam forwards and side steps  STS on airex 2x10  Leg ext 35# 2x10  HS curl 45# 2x10 NuStep L6 x6 LE only Lateral step ups 4"    09/04/22 Bike L3 x31mins with 2 power bursts Stepping in and out of 6" box  Treadmill pushes 30s x3  Small jumps in place x5  Walking with ball toss and direction changes  Leg press 60# 2x10, 20# 2x10  Calf raises on leg press 20# 2x20   08/28/22 Walking outdoors big lap Stair training in other Catering manager board standing [place]one taps on airex STS on airex with ball catch 2x10  Step overs 6"    08/22/22 Gait around the parking [place] Airex balance beam side stepping and tandem walking Dyna disc ankle motions in sitting and standing Wobble board ankle motions in stiing and working on control Direction changes Calf stretches Right ankle red to green weight quickly simulating gas and brake Side step on and off airex Leg press right only 20# working on control of the knee 3 ways with foot plate Right ankle calf raise on the leg press 20#   08/14/22 Step ups 6" Calf stretch 30s  Calf raises 2x12 Toe raises 2x12 Step overs 4" Stair training  STS on airex 2x10 Hitting ball standing on airex  Walking on beam Tandem standing on beam 30s  Feet together on foam 30s then eyes closed   PATIENT EDUCATION: Education details: PT POC, Eval results, Person educated: Patient Education method: education Education comprehension: verbalized understanding and returned demonstration   HOME EXERCISE PROGRAM:  GOALS: Goals  reviewed with patient? Yes  SHORT TERM GOALS: Target date: 08/14/22  Pt will be independent with HEP for improved strength, balance, gait. Baseline:  Initial HEP from CIR Goal status: MET  LONG TERM GOALS: Target date: 10/28/22  Pt will be independent with HEP for improved strength,  balance, transfers, and gait. Baseline:  Initial HEP from CIR Goal status: progressing 09/18/22  2.  Pt will improve 5x sit<>stand to less than or equal to 15 sec to demonstrate improved functional strength and transfer efficiency. Baseline: 22 seconds (scores >15 sec indicate fall risk) Goal status: INITIAL  3.  Pt will improve TUG score to less than or equal to 16 sec for decreased fall risk. Baseline: 20 seconds (indicates increased fall risk and difficulty with ADLs in home) Goal status:ongoing  4.  Increase Berg balance score to 50/56 Baseline: 47/56 Goal status: ongoing  5.  Increase right ankle DF to 8 degress Baseline: 0 degrees Goal status: INITIAL  ASSESSMENT:  CLINICAL IMPRESSION: Patient continues to have right breast pain, this brought up that after her mastectomy and subsequent breast reconstruction that she did not get to do PT due to the brain surgery.  Pt challenged at times with functional endurance requiring rest with outdoor ambulation, and RUE fatigue with rows and ext.. Foot drop became more evident with down hill ambulation. RLE clearance issues wt times noted with side steps on airex.    OBJECTIVE IMPAIRMENTS Abnormal gait, decreased balance, decreased mobility, difficulty walking, decreased ROM, decreased strength, impaired flexibility, and impaired tone.   ACTIVITY LIMITATIONS standing, transfers, locomotion level, and caring for others  PARTICIPATION LIMITATIONS: meal prep, driving, shopping, community activity, and occupation  PERSONAL FACTORS 3+ comorbidities: see above for PMH  are also affecting patient's functional outcome.   REHAB POTENTIAL: Good  CLINICAL DECISION MAKING: Evolving/moderate complexity  EVALUATION COMPLEXITY: Moderate  PLAN: PT FREQUENCY: 2x/week  PT DURATION: 12 weeks plus eval  PLANNED INTERVENTIONS: Therapeutic exercises, Therapeutic activity, Neuromuscular re-education, Balance training, Gait training, Patient/Family  education, Joint mobilization, Orthotic/Fit training, DME instructions, Aquatic Therapy, and Manual therapy  PLAN FOR NEXT SESSION: May see another 3-4 weeks to maximize her function  Scot Jun, PTA 09/24/2022, 11:02 AM

## 2022-10-03 ENCOUNTER — Ambulatory Visit: Payer: Medicaid Other | Admitting: Physical Therapy

## 2022-10-03 ENCOUNTER — Encounter: Payer: Self-pay | Admitting: Physical Therapy

## 2022-10-03 DIAGNOSIS — R2681 Unsteadiness on feet: Secondary | ICD-10-CM

## 2022-10-03 DIAGNOSIS — M6281 Muscle weakness (generalized): Secondary | ICD-10-CM

## 2022-10-03 DIAGNOSIS — R262 Difficulty in walking, not elsewhere classified: Secondary | ICD-10-CM

## 2022-10-03 DIAGNOSIS — R278 Other lack of coordination: Secondary | ICD-10-CM

## 2022-10-03 NOTE — Therapy (Signed)
OUTPATIENT PHYSICAL THERAPY NEURO TREATMENT   Patient Name: Sheryl Mcmillan MRN: [id number] DOB:11/09/1975, 47 y.o., female Today's Date: 10/03/2022   PCP: [name], PA-C REFERRING PROVIDER: [name], MD   PT End of Session - 10/03/22 1022     Visit Number 10    Number of Visits 27    Date for PT Re-Evaluation 10/26/22    PT Start Time 1000    PT Stop Time 1046    PT Time Calculation (min) 46 min    Activity Tolerance Patient tolerated treatment well    Behavior During Therapy [facility] for tasks assessed/performed               Past Medical History:  Diagnosis Date   Arthritis    Breast cancer    Family history of non-Hodgkin's lymphoma 06/01/2020   GERD (gastroesophageal reflux disease)    Hemorrhoids    Migraines    PCOS (polycystic ovarian syndrome)    PONV (postoperative nausea and vomiting)    Past Surgical History:  Procedure Laterality Date   APPLICATION OF CRANIAL NAVIGATION Left 11/10/2021   Procedure: APPLICATION OF CRANIAL NAVIGATION;  Surgeon: [name], MD;  Location: [facility] OR;  Service: Neurosurgery;  Laterality: Left;   AXILLARY SENTINEL NODE BIOPSY Left 11/07/2020   Procedure: LEFT AXILLARY SENTINEL NODE BIOPSY;  Surgeon: [name], MD;  Location: [facility] OR;  Service: General;  Laterality: Left;   BREAST CYST EXCISION Right 09/22/2021   Procedure: Excision of right axillary excess soft tissue.;  Surgeon: [name], MD;  Location: [facility];  Service: Plastics;  Laterality: Right;   BREAST RECONSTRUCTION WITH PLACEMENT OF TISSUE EXPANDER AND FLEX HD (ACELLULAR HYDRATED DERMIS) Bilateral 11/07/2020   Procedure: BILATERAL BREAST RECONSTRUCTION WITH PLACEMENT OF TISSUE EXPANDER AND FLEX HD (ACELLULAR HYDRATED DERMIS);  Surgeon: [name], MD;  Location: [facility] OR;  Service: Plastics;  Laterality: Bilateral;   COSMETIC SURGERY     CRANIOTOMY Left 11/10/2021   Procedure: LT FRONTAL CRANIOTOMY TUMOR EXCISION;  Surgeon:  [name], MD;  Location: [facility] OR;  Service: Neurosurgery;  Laterality: Left;   DILATION AND CURETTAGE OF UTERUS     HEMORRHOID SURGERY     IR IMAGING GUIDED PORT INSERTION  06/15/2020   LIPOSUCTION WITH LIPOFILLING Bilateral 09/22/2021   Procedure: Fat grafting bilateral breast;  Surgeon: [name], MD;  Location: [facility];  Service: Plastics;  Laterality: Bilateral;   MASS EXCISION Bilateral 04/25/2021   Procedure: excision of bilateral axillary breast tissue;  Surgeon: [name], MD;  Location: [facility];  Service: Plastics;  Laterality: Bilateral;   PORT-A-CATH REMOVAL Right 11/07/2020   Procedure: REMOVAL PORT-A-CATH;  Surgeon: [name], MD;  Location: [facility] OR;  Service: General;  Laterality: Right;   REMOVAL OF BILATERAL TISSUE EXPANDERS WITH PLACEMENT OF BILATERAL BREAST IMPLANTS Bilateral 04/25/2021   Procedure: REMOVAL OF BILATERAL TISSUE EXPANDERS WITH PLACEMENT OF BILATERAL BREAST IMPLANTS;  Surgeon: [name], MD;  Location:  SURGERY CENTER;  Service: Plastics;  Laterality: Bilateral;   ROBOTIC ASSISTED TOTAL HYSTERECTOMY WITH BILATERAL SALPINGO OOPHERECTOMY Bilateral 03/14/2021   Procedure: XI ROBOTIC ASSISTED TOTAL HYSTERECTOMY WITH BILATERAL SALPINGO OOPHORECTOMY;  Surgeon: [name], MD;  Location: [facility];  Service: Gynecology;  Laterality: Bilateral;   TOTAL MASTECTOMY Bilateral 11/07/2020   Procedure: BILATERAL MASTECTOMY;  Surgeon: [name], MD;  Location: [facility] OR;  Service: General;  Laterality: Bilateral;   Patient Active Problem List   Diagnosis Date Noted  Brain tumor 11/08/2021   Metastatic cancer to brain 11/08/2021   Cerebral edema 11/08/2021   Complex partial seizure 11/08/2021   Brain mass 11/07/2021   Right sided weakness 11/07/2021   IDA (iron deficiency anemia) 07/18/2021   Cholelithiasis 03/14/2021   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   Morbid obesity with BMI  of 40.0-44.9, adult 07/19/2020   BRCA1 gene mutation positive 07/04/2020   Genetic testing 06/28/2020   Family history of non-Hodgkin's lymphoma 06/01/2020   Malignant neoplasm of upper-outer quadrant of left breast in female, estrogen receptor positive 05/26/2020    ONSET DATE: 12/05/2021 (MD referral)  REFERRING DIAG: D49.6 (ICD-10-CM) - Neoplasm of unspecified behavior of brain  THERAPY DIAG:  Muscle weakness (generalized)  Unsteadiness on feet  Other lack of coordination  Difficulty in walking, not elsewhere classified  Rationale for Evaluation and Treatment Rehabilitation  SUBJECTIVE:                                                                                                                                                                                              SUBJECTIVE STATEMENT: Patient comes in 30 minutes late, she reports that she thought it was the correct time, she became very tearful and emotional, she reports that she has had some issues with memory recently and is fearful of the brain tumors, she has an appointment with the MD next week Pt accompanied by: self  PERTINENT HISTORY: metastatic breast cancer, brain tumor s/p excision with craniotomy; GERD, morbid obesity.  Admitted to rehab ([facility]) 11/17/21 and d/c home 12/09/2021  PAIN:  Are you having pain? No  PRECAUTIONS: Fall and Other: no driving  FALLS: Has patient fallen in last 6 months? 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 Now s/p L frontal crani tumor excision 5/19.  COGNITION: Overall cognitive status: Within functional limits for tasks assessed   SENSATION:  Light touch: WFL  COORDINATION: Decreased coordination RLE-needs assist of LLE or UEs to place RLE   EDEMA:  none  MUSCLE TONE: RLE: Hypotonic   POSTURE: rounded shoulders  LOWER EXTREMITY ROM:  Active Right Eval Left Eval  Hip flexion    Hip extension    Hip abduction    Hip adduction    Hip internal rotation    Hip external rotation    Knee flexion    Knee extension    Ankle dorsiflexion 0   Ankle plantarflexion 30   Ankle inversion 10   Ankle eversion 5    (Blank rows = not tested)  LOWER EXTREMITY MMT:    MMT Right Eval Left Eval  Hip flexion 3-/5 4/5  Hip extension    Hip abduction 3-/5 4/5  Hip adduction 3+/5 4/5  Hip internal rotation    Hip external rotation    Knee flexion 3-/5 4+/5  Knee extension 3-+5 4+/5  Ankle dorsiflexion 2+ 5/5  Ankle plantarflexion 2   Ankle inversion 2+   Ankle eversion    (Blank rows = not tested)   TRANSFERS: Assistive device utilized: no device  Sit to stand: SBA Stand to sit: SBA  GAIT: Gait pattern: step through pattern, decreased step length- Left, decreased stance time- Right, decreased ankle dorsiflexion- Right, and genu recurvatum- Right decreased control of the right LE especially the foot Distance walked: 60 ft  Assistive device utilized:no device Level of assistance: SBA Going down staris is one at a time  FUNCTIONAL TESTs:  TUG 20 seconds no device 5XSTS = 22 seconds Berg 47/56  Treatment 10/03/22 Worked on the right shoulder ROM, passive stretch some right lateral trunk stretching and STM over the scars to help with ROM and overall movements Passive stretch calves, HS and piriformis Feet on ball K2C, trunk rotation, small bridge and iso abs Modified planks 5 x 5s Quadraped leg lift, arm lift  and alternating bird dogs Elliptical level 4 x 3 minutes Side step on and off airex Sit to stand with weighted ball push Minitramp march and bounce  09/24/22 NuStep L5 x6 Gait around the back building a large lap and a small lap she got short of breath twice and had one very short rest while standing 35# HS curls 2x15 Leg extension 25# 2x15 Rows 15lb 2x10 Side step on and of airex  Standing on airex shoulder Ext 10lb 2x10   09/18/22 Bike level 3 x 6 minutes Gait around the back building a large lap and a small lap she got short of breath twice and had one very short rest while standing 35# HS curls 2x15 Leg extension 25# 2x15 On airex 5# straight arm pulls 20# seated row 20# lats Doorways stretch 3 ways for opening the chest One more large lap around the back building  09/14/22 Walking on beam forwards and side steps  STS on airex 2x10  Leg ext 35# 2x10  HS curl 45# 2x10 NuStep L6 x6 LE only Lateral step ups 4"    09/04/22 Bike L3 x66mins with 2 power bursts Stepping in and out of 6" box  Treadmill pushes 30s x3  Small jumps in place x5  Walking with ball toss and direction changes  Leg press 60# 2x10, 20# 2x10  Calf raises on leg press 20# 2x20   08/28/22 Walking outdoors big lap Stair training in other Engineer, agricultural board standing Cone taps on airex STS on airex with ball catch 2x10  Step overs 6"    08/22/22 Gait around the parking [place] Airex balance beam side stepping and tandem walking Dyna disc ankle motions in sitting and standing Wobble board ankle motions in stiing and working on control Direction changes  Calf stretches Right ankle red to green weight quickly simulating gas and brake Side step on and off airex Leg press right only 20# working on control of the knee 3 ways with foot plate Right ankle calf raise on the leg press 20#   08/14/22 Step ups 6" Calf stretch 30s  Calf raises 2x12 Toe raises 2x12 Step overs 4" Stair training  STS on  airex 2x10 Hitting ball standing on airex  Walking on beam Tandem standing on beam 30s  Feet together on foam 30s then eyes closed   PATIENT EDUCATION: Education details: PT POC, Eval results, Person educated: Patient Education method: education Education comprehension: verbalized understanding and returned demonstration   HOME EXERCISE PROGRAM:  GOALS: Goals reviewed with patient? Yes  SHORT TERM GOALS: Target date: 08/14/22  Pt will be independent with HEP for improved strength, balance, gait. Baseline:  Initial HEP from CIR Goal status: MET  LONG TERM GOALS: Target date: 10/28/22  Pt will be independent with HEP for improved strength, balance, transfers, and gait. Baseline:  Initial HEP from CIR Goal status: progressing 09/18/22  2.  Pt will improve 5x sit<>stand to less than or equal to 15 sec to demonstrate improved functional strength and transfer efficiency. Baseline: 22 seconds (scores >15 sec indicate fall risk) Goal status: progressing  3.  Pt will improve TUG score to less than or equal to 16 sec for decreased fall risk. Baseline: 20 seconds (indicates increased fall risk and difficulty with ADLs in home) Goal status:ongoing  4.  Increase Berg balance score to 50/56 Baseline: 47/56 Goal status: ongoing  5.  Increase right ankle DF to 8 degress Baseline: 0 degrees Goal status: INITIAL  ASSESSMENT:  CLINICAL IMPRESSION: Patient continues to have right breast pain, this brought up that after her mastectomy and subsequent breast reconstruction that she did not get to do PT due to the brain surgery.  I did some passive stretch to the right shoulder and UE as well as some gentle scar massage on the right lateral trunk.  She was very emotional today due to some memory issues.  We did some core stabilization and some balance.  Has difficulty with the right ankle clearing toe   OBJECTIVE IMPAIRMENTS Abnormal gait, decreased balance, decreased mobility, difficulty  walking, decreased ROM, decreased strength, impaired flexibility, and impaired tone.   ACTIVITY LIMITATIONS standing, transfers, locomotion level, and caring for others  PARTICIPATION LIMITATIONS: meal prep, driving, shopping, community activity, and occupation  PERSONAL FACTORS 3+ comorbidities: see above for PMH  are also affecting patient's functional outcome.   REHAB POTENTIAL: Good  CLINICAL DECISION MAKING: Evolving/moderate complexity  EVALUATION COMPLEXITY: Moderate  PLAN: PT FREQUENCY: 2x/week  PT DURATION: 12 weeks plus eval  PLANNED INTERVENTIONS: Therapeutic exercises, Therapeutic activity, Neuromuscular re-education, Balance training, Gait training, Patient/Family education, Joint mobilization, Orthotic/Fit training, DME instructions, Aquatic Therapy, and Manual therapy  PLAN FOR NEXT SESSION: see how she is doing and may wait until after she sees MD regarding more visits  Dhana Totton W, PT 10/03/2022, 10:23 AM

## 2022-10-08 ENCOUNTER — Encounter: Payer: Self-pay | Admitting: *Deleted

## 2022-10-08 NOTE — Progress Notes (Signed)
Received request for assessment for personal care services attestation of medical need from [employer].  Pt states she is requesting a home health aid to assist with some ADL's and light housework.  Attestation filled out and signed by provider and successfully faxed to [phone].

## 2022-10-09 ENCOUNTER — Ambulatory Visit: Payer: Medicaid Other | Admitting: Physical Therapy

## 2022-10-09 ENCOUNTER — Encounter: Payer: Self-pay | Admitting: Physical Therapy

## 2022-10-09 DIAGNOSIS — R2681 Unsteadiness on feet: Secondary | ICD-10-CM

## 2022-10-09 DIAGNOSIS — R262 Difficulty in walking, not elsewhere classified: Secondary | ICD-10-CM

## 2022-10-09 DIAGNOSIS — M6281 Muscle weakness (generalized): Secondary | ICD-10-CM

## 2022-10-09 NOTE — Therapy (Signed)
OUTPATIENT PHYSICAL THERAPY NEURO TREATMENT   Patient Name: Sheryl Mcmillan MRN: [id number] DOB:03/18/1976, 47 y.o., female Today's Date: 10/09/2022   PCP: [name], PA-C REFERRING PROVIDER: [name], MD   PT End of Session - 10/09/22 1415     Visit Number 11    Number of Visits 27    Date for PT Re-Evaluation 10/26/22    PT Start Time 1343    PT Stop Time 1434    PT Time Calculation (min) 51 min    Activity Tolerance Patient tolerated treatment well    Behavior During Therapy [facility] for tasks assessed/performed               Past Medical History:  Diagnosis Date   Arthritis    Breast cancer    Family history of non-Hodgkin's lymphoma 06/01/2020   GERD (gastroesophageal reflux disease)    Hemorrhoids    Migraines    PCOS (polycystic ovarian syndrome)    PONV (postoperative nausea and vomiting)    Past Surgical History:  Procedure Laterality Date   APPLICATION OF CRANIAL NAVIGATION Left 11/10/2021   Procedure: APPLICATION OF CRANIAL NAVIGATION;  Surgeon: [name], MD;  [facility]: [facility];  Service: Neurosurgery;  Laterality: Left;   AXILLARY SENTINEL NODE BIOPSY Left 11/07/2020   Procedure: LEFT AXILLARY SENTINEL NODE BIOPSY;  Surgeon: [name], MD;  Location: [facility] OR;  Service: General;  Laterality: Left;   BREAST CYST EXCISION Right 09/22/2021   Procedure: Excision of right axillary excess soft tissue.;  Surgeon: [name], MD;  Location: [facility];  Service: Plastics;  Laterality: Right;   BREAST RECONSTRUCTION WITH PLACEMENT OF TISSUE EXPANDER AND FLEX HD (ACELLULAR HYDRATED DERMIS) Bilateral 11/07/2020   Procedure: BILATERAL BREAST RECONSTRUCTION WITH PLACEMENT OF TISSUE EXPANDER AND FLEX HD (ACELLULAR HYDRATED DERMIS);  Surgeon: [name], MD;  Location: [facility] OR;  Service: Plastics;  Laterality: Bilateral;   COSMETIC SURGERY     CRANIOTOMY Left 11/10/2021   Procedure: LT FRONTAL CRANIOTOMY TUMOR EXCISION;  Surgeon:  [name], MD;  [facility];  Service: Neurosurgery;  Laterality: Left;   DILATION AND CURETTAGE OF UTERUS     HEMORRHOID SURGERY     IR IMAGING GUIDED PORT INSERTION  06/15/2020   LIPOSUCTION WITH LIPOFILLING Bilateral 09/22/2021   Procedure: Fat grafting bilateral breast;  Surgeon: [name], MD;  Location: [facility];  Service: Plastics;  Laterality: Bilateral;   MASS EXCISION Bilateral 04/25/2021   Procedure: excision of bilateral axillary breast tissue;  Surgeon: [name], MD;  Location: [facility];  Service: Plastics;  Laterality: Bilateral;   PORT-A-CATH REMOVAL Right 11/07/2020   Procedure: REMOVAL PORT-A-CATH;  Surgeon: [name], MD;  Location: [facility] OR;  Service: General;  Laterality: Right;   REMOVAL OF BILATERAL TISSUE EXPANDERS WITH PLACEMENT OF BILATERAL BREAST IMPLANTS Bilateral 04/25/2021   Procedure: REMOVAL OF BILATERAL TISSUE EXPANDERS WITH PLACEMENT OF BILATERAL BREAST IMPLANTS;  Surgeon: [name], MD;  Location: [facility];  Service: Plastics;  Laterality: Bilateral;   ROBOTIC ASSISTED TOTAL HYSTERECTOMY WITH BILATERAL SALPINGO OOPHERECTOMY Bilateral 03/14/2021   Procedure: XI ROBOTIC ASSISTED TOTAL HYSTERECTOMY WITH BILATERAL SALPINGO OOPHORECTOMY;  Surgeon: [name], MD;  Location: [facility];  Service: Gynecology;  Laterality: Bilateral;   TOTAL MASTECTOMY Bilateral 11/07/2020   Procedure: BILATERAL MASTECTOMY;  Surgeon: [name], MD;  Location: [facility] OR;  Service: General;  Laterality: Bilateral;   Patient Active Problem List   Diagnosis Date Noted  Brain tumor 11/08/2021   Metastatic cancer to brain 11/08/2021   Cerebral edema 11/08/2021   Complex partial seizure 11/08/2021   Brain mass 11/07/2021   Right sided weakness 11/07/2021   IDA (iron deficiency anemia) 07/18/2021   Cholelithiasis 03/14/2021   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   Morbid obesity with BMI  of 40.0-44.9, adult 07/19/2020   BRCA1 gene mutation positive 07/04/2020   Genetic testing 06/28/2020   Family history of non-Hodgkin's lymphoma 06/01/2020   Malignant neoplasm of upper-outer quadrant of left breast in female, estrogen receptor positive 05/26/2020    ONSET DATE: 12/05/2021 (MD referral)  REFERRING DIAG: D49.6 (ICD-10-CM) - Neoplasm of unspecified behavior of brain  THERAPY DIAG:  Muscle weakness (generalized)  Unsteadiness on feet  Difficulty in walking, not elsewhere classified  Rationale for Evaluation and Treatment Rehabilitation  SUBJECTIVE:                                                                                                                                                                                              SUBJECTIVE STATEMENT: Patient reports that she has had some left knee buckling , she reports that [place] working on the shoulder and chest really helped after last time Pt accompanied by: self  PERTINENT HISTORY: metastatic breast cancer, brain tumor s/p excision with craniotomy; GERD, morbid obesity.  Admitted to rehab ([facility]) 11/17/21 and d/c home 12/09/2021  PAIN:  Are you having pain? No  PRECAUTIONS: Fall and Other: no driving  FALLS: Has patient fallen in last 6 months? 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 Now s/p L frontal crani tumor excision 5/19.  COGNITION: Overall cognitive status: Within  functional limits for tasks assessed   SENSATION:  Light touch: WFL  COORDINATION: Decreased coordination RLE-needs assist of LLE or UEs to place RLE   EDEMA:  none  MUSCLE TONE: RLE: Hypotonic   POSTURE: rounded shoulders  LOWER EXTREMITY ROM:     Active Right Eval Left Eval  Hip flexion    Hip extension    Hip abduction    Hip adduction  Hip internal rotation    Hip external rotation    Knee flexion    Knee extension    Ankle dorsiflexion 0   Ankle plantarflexion 30   Ankle inversion 10   Ankle eversion 5    (Blank rows = not tested)  LOWER EXTREMITY MMT:    MMT Right Eval Left Eval  Hip flexion 3-/5 4/5  Hip extension    Hip abduction 3-/5 4/5  Hip adduction 3+/5 4/5  Hip internal rotation    Hip external rotation    Knee flexion 3-/5 4+/5  Knee extension 3-+5 4+/5  Ankle dorsiflexion 2+ 5/5  Ankle plantarflexion 2   Ankle inversion 2+   Ankle eversion    (Blank rows = not tested)   TRANSFERS: Assistive device utilized: no device  Sit to stand: SBA Stand to sit: SBA  GAIT: Gait pattern: step through pattern, decreased step length- Left, decreased stance time- Right, decreased ankle dorsiflexion- Right, and genu recurvatum- Right decreased control of the right LE especially the foot Distance walked: 60 ft  Assistive device utilized:no device Level of assistance: SBA Going down staris is one at a time  FUNCTIONAL TESTs:  TUG 20 seconds no device 5XSTS = 22 seconds Berg 47/56  Treatment 10/08/22 Large lap around the back building good pace On airex balance beam side stepping and tandem walk On airex pad 10# straight arm pulls On airex cone toe touches Volley ball on and off airex Right foot on sit fit ankle motions Yellow tband right ankle x15 each Supine feet on ball K2C, rotation, bridges and isometric abs PROM of the right arm and shoulder, gentle STM of the scar right chest/flank  10/03/22 Worked on the right shoulder ROM,  passive stretch some right lateral trunk stretching and STM over the scars to help with ROM and overall movements Passive stretch calves, HS and piriformis Feet on ball K2C, trunk rotation, small bridge and iso abs Modified planks 5 x 5s Quadraped leg lift, arm lift and alternating bird dogs Elliptical level 4 x 3 minutes Side step on and off airex Sit to stand with weighted ball push Minitramp march and bounce  09/24/22 NuStep L5 x6 Gait around the back building a large lap and a small lap she got short of breath twice and had one very short rest while standing 35# HS curls 2x15 Leg extension 25# 2x15 Rows 15lb 2x10 Side step on and of airex  Standing on airex shoulder Ext 10lb 2x10   09/18/22 Bike level 3 x 6 minutes Gait around the back building a large lap and a small lap she got short of breath twice and had one very short rest while standing 35# HS curls 2x15 Leg extension 25# 2x15 On airex 5# straight arm pulls 20# seated row 20# lats Doorways stretch 3 ways for opening the chest One more large lap around the back building  09/14/22 Walking on beam forwards and side steps  STS on airex 2x10  Leg ext 35# 2x10  HS curl 45# 2x10 NuStep L6 x6 LE only Lateral step ups 4"    09/04/22 Bike L3 x59mins with 2 power bursts Stepping in and out of 6" box  Treadmill pushes 30s x3  Small jumps in place x5  Walking with ball toss and direction changes  Leg press 60# 2x10, 20# 2x10  Calf raises on leg press 20# 2x20   08/28/22 Walking outdoors big lap Stair training in other Estate manager/land agent standing [place] taps on airex  STS on airex with ball catch 2x10  Step overs 6"    08/22/22 PATIENT EDUCATION: Education details: PT POC, Eval results, Person educated: Patient Education method: education Education comprehension: verbalized understanding and returned demonstration   HOME EXERCISE PROGRAM:  GOALS: Goals reviewed with patient? Yes  SHORT TERM GOALS: Target  date: 08/14/22  Pt will be independent with HEP for improved strength, balance, gait. Baseline:  Initial HEP from CIR Goal status: MET  LONG TERM GOALS: Target date: 10/28/22  Pt will be independent with HEP for improved strength, balance, transfers, and gait. Baseline:  Initial HEP from CIR Goal status: progressing 09/18/22  2.  Pt will improve 5x sit<>stand to less than or equal to 15 sec to demonstrate improved functional strength and transfer efficiency. Baseline: 22 seconds (scores >15 sec indicate fall risk) Goal status: progressing  3.  Pt will improve TUG score to less than or equal to 16 sec for decreased fall risk. Baseline: 20 seconds (indicates increased fall risk and difficulty with ADLs in home) Goal status:ongoing  4.  Increase Berg balance score to 50/56 Baseline: 47/56 Goal status: ongoing  5.  Increase right ankle DF to 8 degress Baseline: 0 degrees Goal status: INITIAL  ASSESSMENT:  CLINICAL IMPRESSION: Patient doing better after last week with the  right breast and shoulder pain, she is still stiff and gaurded, she does have some issues with the right ankle fatigue.  She has a brain scan this Thursday  OBJECTIVE IMPAIRMENTS Abnormal gait, decreased balance, decreased mobility, difficulty walking, decreased ROM, decreased strength, impaired flexibility, and impaired tone.   ACTIVITY LIMITATIONS standing, transfers, locomotion level, and caring for others  PARTICIPATION LIMITATIONS: meal prep, driving, shopping, community activity, and occupation  PERSONAL FACTORS 3+ comorbidities: see above for PMH  are also affecting patient's functional outcome.   REHAB POTENTIAL: Good  CLINICAL DECISION MAKING: Evolving/moderate complexity  EVALUATION COMPLEXITY: Moderate  PLAN: PT FREQUENCY: 2x/week  PT DURATION: 12 weeks plus eval  PLANNED INTERVENTIONS: Therapeutic exercises, Therapeutic activity, Neuromuscular re-education, Balance training, Gait training,  Patient/Family education, Joint mobilization, Orthotic/Fit training, DME instructions, Aquatic Therapy, and Manual therapy  PLAN FOR NEXT SESSION: will wait until after the results of the brain scan regarding continuing PT Zaim Nitta W, PT 10/09/2022, 2:15 PM

## 2022-10-11 ENCOUNTER — Ambulatory Visit (HOSPITAL_COMMUNITY)
Admission: RE | Admit: 2022-10-11 | Discharge: 2022-10-11 | Disposition: A | Discharge status: Home / Self Care | Payer: Medicaid Other | Source: Ambulatory Visit | Attending: Radiation Oncology | Admitting: Radiation Oncology

## 2022-10-11 DIAGNOSIS — C7931 Secondary malignant neoplasm of brain: Secondary | ICD-10-CM

## 2022-10-11 MED ORDER — GADOBUTROL 1 MMOL/ML IV SOLN
10.0000 mL | Freq: Once | INTRAVENOUS | Status: AC | PRN
  Administered 2022-10-11: 10 mL via INTRAVENOUS

## 2022-10-15 ENCOUNTER — Encounter: Payer: Self-pay | Admitting: Radiation Oncology

## 2022-10-15 ENCOUNTER — Ambulatory Visit
Admission: RE | Admit: 2022-10-15 | Discharge: 2022-10-15 | Disposition: A | Discharge status: Home / Self Care | Payer: Medicaid Other | Source: Ambulatory Visit | Attending: Radiation Oncology | Admitting: Radiation Oncology

## 2022-10-15 ENCOUNTER — Other Ambulatory Visit: Payer: Self-pay | Admitting: Radiation Oncology

## 2022-10-15 DIAGNOSIS — C7931 Secondary malignant neoplasm of brain: Secondary | ICD-10-CM

## 2022-10-15 DIAGNOSIS — C50412 Malignant neoplasm of upper-outer quadrant of left female breast: Secondary | ICD-10-CM

## 2022-10-15 DIAGNOSIS — R410 Disorientation, unspecified: Secondary | ICD-10-CM

## 2022-10-15 DIAGNOSIS — Z170 Estrogen receptor positive status [ER+]: Secondary | ICD-10-CM

## 2022-10-15 NOTE — Progress Notes (Addendum)
Telephone nursing appointment for patient to review most recent scan results from 10/11/2022. I verified patient's identity and began nursing interview. Patient reports a cough, managing w/ cough meds and recovering well from a moderate fall a month ago. No other issues conveyed at this time.   Meaningful use complete.   Patient aware of their 2:30pm-10/15/2022 telephone appointment w/ Laurence Aly PA-C. I left my extension [phone] in case patient needs anything. Patient verbalized understanding. This concludes the nursing interview.   Patient contact [phone]     Ruel Favors, LPN

## 2022-10-15 NOTE — Progress Notes (Signed)
Radiation Oncology         [phone]) [phone] ________________________________  Outpatient Follow Up - Conducted via telephone at patient request.  I spoke with the patient to conduct this visit via telephone. The patient was notified in advance and was offered an in person or telemedicine meeting to allow for face to face communication but instead preferred to proceed with a telephone visit.   Name: Sheryl Mcmillan        MRN: [id number]  Date of Service: 10/15/2022 DOB: 09/14/1975  NF:[id number], Salomon Fick, NP  [name], PA-C     REFERRING PHYSICIAN: [name], PA-C   DIAGNOSIS: The primary encounter diagnosis was Malignant neoplasm of upper-outer quadrant of left breast in female, estrogen receptor positive. A diagnosis of Metastatic cancer to brain was also pertinent to this visit.   HISTORY OF PRESENT ILLNESS: Sheryl Mcmillan is a 47 y.o. female with a diagnosis of left breast cancer with brain metastasis.  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  She received neoadjuvant chemotherapy, bilateral mastectomies without evidence of disease on the right but in the left residual T1cN1 disease was present, her tumor converted to triple negative.  She underwent hysterectomy with BSO in September 2022.    She was found to have brain disease in May 2023 when she presented with right sided extremity weakness; workup showed a 2.2 cm partially solid left superior hemisphere mass with a large area of vasogenic edema.  She  received preoperative SRS on 11/10/21, and then surgical resection the same day. Final pathology revealed metastatic adenocarcinoma consistent with her known breast primary. An MRI in January 2024 showed a new lesion in the right precentral gyrus measuring 2 mm. She was treated with single fraction  SRS.   Her first post treatment MRI on 10/11/22 showed stable post treatment changes in the left frontal surgical cavity without significant change in her contrast enhancement. She did have resolution of the previously treated right precentral gyrus lesion . She's contacted by phone to review these results. She is alert to person, self, time, and place at this moment during our call.    PREVIOUS RADIATION THERAPY:    07/10/2022 through 07/10/2022 SRS Treatment Site Technique Total Dose (Gy) Dose per Fx (Gy) Completed Fx Beam Energies  Brain:   PTV_2_RprecentGyrus_20mm 3D 20/20 20 1/1 6XFFF    11/10/2021 through 11/10/2021 Preoperative SRS Site Technique Total Dose (Gy) Dose per Fx (Gy) Completed Fx Beam Energies  Brain: Brain PTV_1_Superior_29mm IMRT 18/18 18 1/1 6XFFF     PAST MEDICAL HISTORY:  Past Medical History:  Diagnosis Date   Arthritis    Breast cancer    Family history of non-Hodgkin's lymphoma 06/01/2020   GERD (gastroesophageal reflux disease)    Hemorrhoids    Migraines    PCOS (polycystic ovarian syndrome)    PONV (postoperative nausea and vomiting)        PAST SURGICAL HISTORY: Past Surgical History:  Procedure Laterality Date   APPLICATION OF CRANIAL NAVIGATION Left 11/10/2021   Procedure: APPLICATION OF CRANIAL NAVIGATION;  Surgeon: [name], MD;  Location: [facility];  Service: Neurosurgery;  Laterality: Left;   AXILLARY SENTINEL NODE BIOPSY Left 11/07/2020   Procedure: LEFT AXILLARY SENTINEL NODE BIOPSY;  Surgeon: [name], MD;  Location: [facility] OR;  Service: General;  Laterality:  Left;   BREAST CYST EXCISION Right 09/22/2021   Procedure: Excision of right axillary excess soft tissue.;  Surgeon: [name], MD;  Location: [facility];  Service: Plastics;  Laterality: Right;   BREAST RECONSTRUCTION WITH PLACEMENT OF TISSUE EXPANDER AND FLEX HD (ACELLULAR HYDRATED DERMIS) Bilateral 11/07/2020   Procedure: BILATERAL BREAST  RECONSTRUCTION WITH PLACEMENT OF TISSUE EXPANDER AND FLEX HD (ACELLULAR HYDRATED DERMIS);  Surgeon: [name], MD;  Location: [facility] OR;  Service: Plastics;  Laterality: Bilateral;   COSMETIC SURGERY     CRANIOTOMY Left 11/10/2021   Procedure: LT FRONTAL CRANIOTOMY TUMOR EXCISION;  Surgeon: [name], MD;  Location: [facility];  Service: Neurosurgery;  Laterality: Left;   DILATION AND CURETTAGE OF UTERUS     HEMORRHOID SURGERY     IR IMAGING GUIDED PORT INSERTION  06/15/2020   LIPOSUCTION WITH LIPOFILLING Bilateral 09/22/2021   Procedure: Fat grafting bilateral breast;  Surgeon: [name], MD;  Location: [facility];  Service: Plastics;  Laterality: Bilateral;   MASS EXCISION Bilateral 04/25/2021   Procedure: excision of bilateral axillary breast tissue;  Surgeon: [name], MD;  Location: [facility];  Service: Plastics;  Laterality: Bilateral;   PORT-A-CATH REMOVAL Right 11/07/2020   Procedure: REMOVAL PORT-A-CATH;  Surgeon: [name], MD;  Location: [facility] OR;  Service: General;  Laterality: Right;   REMOVAL OF BILATERAL TISSUE EXPANDERS WITH PLACEMENT OF BILATERAL BREAST IMPLANTS Bilateral 04/25/2021   Procedure: REMOVAL OF BILATERAL TISSUE EXPANDERS WITH PLACEMENT OF BILATERAL BREAST IMPLANTS;  Surgeon: [name], MD;  Location: [facility];  Service: Plastics;  Laterality: Bilateral;   ROBOTIC ASSISTED TOTAL HYSTERECTOMY WITH BILATERAL SALPINGO OOPHERECTOMY Bilateral 03/14/2021   Procedure: XI ROBOTIC ASSISTED TOTAL HYSTERECTOMY WITH BILATERAL SALPINGO OOPHORECTOMY;  Surgeon: [name], MD;  Location: [facility];  Service: Gynecology;  Laterality: Bilateral;   TOTAL MASTECTOMY Bilateral 11/07/2020   Procedure: BILATERAL MASTECTOMY;  Surgeon: [name], MD;  Location: [facility] OR;  Service: General;  Laterality: Bilateral;     FAMILY HISTORY:  Family History  Problem Relation Age of Onset   Hypertension Mother    Diabetes  Mother    Cancer Mother 25       unknown primary   Other Mother        brain tumor; dx 9s   Non-Hodgkin's lymphoma Brother 24   Other Maternal Uncle 1       brain tumor   Breast cancer Other        MGM's niece; dx late 28s   Pancreatic cancer Neg Hx    Colon cancer Neg Hx    Endometrial cancer Neg Hx    Prostate cancer Neg Hx    Ovarian cancer Neg Hx      SOCIAL HISTORY:  reports that she quit smoking about 2 years ago. Her smoking use included cigarettes. She has a 5.00 pack-year smoking history. She has never been exposed to tobacco smoke. She has never used smokeless tobacco. She reports that she does not currently use alcohol. She reports current drug use. Drug: Marijuana. The patient is single and lives in [place]. Her daughter Illene Labrador is often part of her office visits.   ALLERGIES: Carboplatin, Other, and Wound dressing adhesive   MEDICATIONS:  Current Outpatient Medications  Medication Sig Dispense Refill   LORazepam (ATIVAN) 1 MG tablet Take one po 30 min prior to mri or radiation 7 tablet 0   baclofen (LIORESAL) 10 MG tablet Take  1 tablet (10 mg total) by mouth 3 (three) times daily. (Patient not taking: Reported on 07/05/2022) 30 each 1   naproxen (NAPROSYN) 500 MG tablet Take 1 tablet (500 mg total) by mouth 2 (two) times daily. 30 tablet 0   No current facility-administered medications for this visit.     REVIEW OF SYSTEMS: On review of systems, the patient reports that she is doing okay. She is concerned though in the last few months of progressive episodes of confusion so much so she thought this scan was going to be concerning. She reports difficulty when driving controlling the brake from the gas, getting lost in public, or not knowing why she arrived somewhere instead of where she intended to go. She feels she is confused to time as well and has been late to appointments she arrived early for. She denies any headaches, changes in speech, movement, or new onset  of any seizures. No other complaints are verbalized.   PHYSICAL EXAM:  Unable to assess due to encounter type.   ECOG = 1  0 - Asymptomatic (Fully active, able to carry on all predisease activities without restriction)  1 - Symptomatic but completely ambulatory (Restricted in physically strenuous activity but ambulatory and able to carry out work of a light or sedentary nature. For example, light housework, office work)  2 - Symptomatic, <50% in bed during the day (Ambulatory and capable of all self care but unable to carry out any work activities. Up and about more than 50% of waking hours)  3 - Symptomatic, >50% in bed, but not bedbound (Capable of only limited self-care, confined to bed or chair 50% or more of waking hours)  4 - Bedbound (Completely disabled. Cannot carry on any self-care. Totally confined to bed or chair)  5 - Death   Santiago Glad MM, Creech RH, Tormey DC, et al. [phone]). "Toxicity and response criteria of the [facility]". Am. Evlyn Clines. Oncol. 5 (6): 649-55    LABORATORY DATA:  Lab Results  Component Value Date   WBC 7.2 12/08/2021   HGB 13.3 12/08/2021   HCT 40.0 12/08/2021   MCV 85.7 12/08/2021   PLT 162 12/08/2021   Lab Results  Component Value Date   NA 135 12/08/2021   K 4.6 12/08/2021   CL 104 12/08/2021   CO2 19 (L) 12/08/2021   Lab Results  Component Value Date   ALT 69 (H) 11/18/2021   AST 29 11/18/2021   ALKPHOS 69 11/18/2021   BILITOT 0.5 11/18/2021      RADIOGRAPHY: No results found.     IMPRESSION/PLAN: 1. Recurrent Metastatic Stage IIA, cT2N0M0, grade 3, weakly ER positive, HER2 amplified invasive ductal carcinoma of the left breast s/p neoadjuvant chemotherapy which converted to triple negative disease with brain metastases. Her MRI was quite reassuring regarding her history. No new disease was appreciated, and the most recently treated lesion has resolved, and overall the previous site of therapy and surgery is  stable. She will continue to follow up with Dr. Pamelia Hoit as she remains in surveillance. We anticipate repeat CT in 3 months and she is aware Dr. Barbaraann Cao may be the one ordering scans from here on.  2 Confusion. The patient is clearly having issues with being confused in different settings, and she does not have a history of any of this prior to treatments. She does not have a neurologist, and we discussed formal assessment with Dr. Barbaraann Cao to see his thoughts on intervention options. She is in  agreement and a referral will be placed.   This encounter was conducted via telephone.  The patient has provided two factor identification and has given verbal consent for this type of encounter and has been advised to only accept a meeting of this type in a secure network environment. The time spent during this encounter was 35 minutes including preparation, discussion, and coordination of the patient's care. The attendants for this meeting include  Ronny Bacon and [employer]. During the encounter,  Ronny Bacon were located at [facility] Radiation Oncology Department.  Suprena L Beidler  was located at home.     Osker Mason, [facility]   **Disclaimer: This note was dictated with voice recognition software. Similar sounding words can inadvertently be transcribed and this note may contain transcription errors which may not have been corrected upon publication of note.**

## 2022-10-16 ENCOUNTER — Ambulatory Visit: Payer: Medicaid Other

## 2022-10-18 ENCOUNTER — Inpatient Hospital Stay: Payer: Medicaid Other | Admitting: Internal Medicine

## 2022-10-18 ENCOUNTER — Telehealth: Payer: Self-pay | Admitting: Internal Medicine

## 2022-10-18 NOTE — Telephone Encounter (Signed)
Patient left voicemail to cancel her appointment this week she is sick, let patient know her appointment is already cancelled.

## 2022-10-23 ENCOUNTER — Encounter: Payer: Self-pay | Admitting: Physical Therapy

## 2022-10-23 ENCOUNTER — Ambulatory Visit: Payer: Medicaid Other | Admitting: Physical Therapy

## 2022-10-23 DIAGNOSIS — M6281 Muscle weakness (generalized): Secondary | ICD-10-CM

## 2022-10-23 DIAGNOSIS — R262 Difficulty in walking, not elsewhere classified: Secondary | ICD-10-CM

## 2022-10-23 DIAGNOSIS — R2681 Unsteadiness on feet: Secondary | ICD-10-CM

## 2022-10-23 NOTE — Therapy (Signed)
OUTPATIENT PHYSICAL THERAPY NEURO TREATMENT   Patient Name: Sheryl Mcmillan MRN: [id number] DOB:1976-03-20, 47 y.o., female Today's Date: 10/23/2022   PCP: [name], PA-C REFERRING PROVIDER: [name], MD   PT End of Session - 10/23/22 1055     Visit Number 12    Number of Visits 27    Date for PT Re-Evaluation 10/26/22    PT Start Time 1055    PT Stop Time 1140    PT Time Calculation (min) 45 min    Activity Tolerance Patient tolerated treatment well    Behavior During Therapy [facility] for tasks assessed/performed               Past Medical History:  Diagnosis Date   Arthritis    Breast cancer (HCC)    Family history of non-Hodgkin's lymphoma 06/01/2020   GERD (gastroesophageal reflux disease)    Hemorrhoids    Migraines    PCOS (polycystic ovarian syndrome)    PONV (postoperative nausea and vomiting)    Past Surgical History:  Procedure Laterality Date   APPLICATION OF CRANIAL NAVIGATION Left 11/10/2021   Procedure: APPLICATION OF CRANIAL NAVIGATION;  Surgeon: [name], MD;  Location: [facility] OR;  Service: Neurosurgery;  Laterality: Left;   AXILLARY SENTINEL NODE BIOPSY Left 11/07/2020   Procedure: LEFT AXILLARY SENTINEL NODE BIOPSY;  Surgeon: [name], MD;  Location: [facility] OR;  Service: General;  Laterality: Left;   BREAST CYST EXCISION Right 09/22/2021   Procedure: Excision of right axillary excess soft tissue.;  Surgeon: [name], MD;  Location: [facility];  Service: Plastics;  Laterality: Right;   BREAST RECONSTRUCTION WITH PLACEMENT OF TISSUE EXPANDER AND FLEX HD (ACELLULAR HYDRATED DERMIS) Bilateral 11/07/2020   Procedure: BILATERAL BREAST RECONSTRUCTION WITH PLACEMENT OF TISSUE EXPANDER AND FLEX HD (ACELLULAR HYDRATED DERMIS);  Surgeon: [name], MD;  Location: [facility] OR;  Service: Plastics;  Laterality: Bilateral;   COSMETIC SURGERY     CRANIOTOMY Left 11/10/2021   Procedure: LT FRONTAL CRANIOTOMY TUMOR EXCISION;  Surgeon:  [name], MD;  Location: [facility] OR;  Service: Neurosurgery;  Laterality: Left;   DILATION AND CURETTAGE OF UTERUS     HEMORRHOID SURGERY     IR IMAGING GUIDED PORT INSERTION  06/15/2020   LIPOSUCTION WITH LIPOFILLING Bilateral 09/22/2021   Procedure: Fat grafting bilateral breast;  Surgeon: [name], MD;  Location: [facility];  Service: Plastics;  Laterality: Bilateral;   MASS EXCISION Bilateral 04/25/2021   Procedure: excision of bilateral axillary breast tissue;  Surgeon: [name], MD;  Location: [facility];  Service: Plastics;  Laterality: Bilateral;   PORT-A-CATH REMOVAL Right 11/07/2020   Procedure: REMOVAL PORT-A-CATH;  Surgeon: [name], MD;  Location: [facility] OR;  Service: General;  Laterality: Right;   REMOVAL OF BILATERAL TISSUE EXPANDERS WITH PLACEMENT OF BILATERAL BREAST IMPLANTS Bilateral 04/25/2021   Procedure: REMOVAL OF BILATERAL TISSUE EXPANDERS WITH PLACEMENT OF BILATERAL BREAST IMPLANTS;  Surgeon: [name], MD;  Location:  SURGERY CENTER;  Service: Plastics;  Laterality: Bilateral;   ROBOTIC ASSISTED TOTAL HYSTERECTOMY WITH BILATERAL SALPINGO OOPHERECTOMY Bilateral 03/14/2021   Procedure: XI ROBOTIC ASSISTED TOTAL HYSTERECTOMY WITH BILATERAL SALPINGO OOPHORECTOMY;  Surgeon: [name], MD;  Location: [facility];  Service: Gynecology;  Laterality: Bilateral;   TOTAL MASTECTOMY Bilateral 11/07/2020   Procedure: BILATERAL MASTECTOMY;  Surgeon: [name], MD;  Location: [facility] OR;  Service: General;  Laterality: Bilateral;   Patient Active Problem List   Diagnosis Date Noted  Brain tumor (HCC) 11/08/2021   Metastatic cancer to brain (HCC) 11/08/2021   Cerebral edema (HCC) 11/08/2021   Complex partial seizure (HCC) 11/08/2021   Brain mass 11/07/2021   Right sided weakness 11/07/2021   IDA (iron deficiency anemia) 07/18/2021   Cholelithiasis 03/14/2021   Peripheral neuropathy due to chemotherapy (HCC)  10/20/2020   Morbid obesity with BMI of 40.0-44.9, adult (HCC) 07/19/2020   BRCA1 gene mutation positive 07/04/2020   Genetic testing 06/28/2020   Family history of non-Hodgkin's lymphoma 06/01/2020   Malignant neoplasm of upper-outer quadrant of left breast in female, estrogen receptor positive (HCC) 05/26/2020    ONSET DATE: 12/05/2021 (MD referral)  REFERRING DIAG: D49.6 (ICD-10-CM) - Neoplasm of unspecified behavior of brain  THERAPY DIAG:  Muscle weakness (generalized)  Unsteadiness on feet  Difficulty in walking, not elsewhere classified  Rationale for Evaluation and Treatment Rehabilitation  SUBJECTIVE:                                                                                                                                                                                              SUBJECTIVE STATEMENT: Patient reports that she got good news on the last scan and went to [place] wihtout much of an issue, she does report a fall yesterday but was able to get up on her own, the fall was caused by a new dog that tripped her. Pt accompanied by: self  PERTINENT HISTORY: metastatic breast cancer, brain tumor s/p excision with craniotomy; GERD, morbid obesity.  Admitted to rehab ([facility]) 11/17/21 and d/c home 12/09/2021  PAIN:  Are you having pain? No  PRECAUTIONS: Fall and Other: no driving  FALLS: Has patient fallen in last 6 months? 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 Now s/p L frontal crani tumor excision 5/19.  COGNITION: Overall cognitive status: Within functional limits for tasks assessed   SENSATION:  Light touch: WFL  COORDINATION: Decreased coordination RLE-needs assist of LLE or UEs to place RLE   EDEMA:  none  MUSCLE TONE: RLE: Hypotonic   POSTURE: rounded shoulders  LOWER EXTREMITY ROM:  Active Right Eval Left Eval  Hip flexion    Hip extension    Hip abduction    Hip adduction    Hip internal rotation    Hip external rotation    Knee flexion    Knee extension    Ankle dorsiflexion 0   Ankle plantarflexion 30   Ankle inversion 10   Ankle eversion 5    (Blank rows = not tested)  LOWER EXTREMITY MMT:    MMT Right Eval Left Eval  Hip flexion 3-/5 4/5  Hip extension    Hip abduction 3-/5 4/5  Hip adduction 3+/5 4/5  Hip internal rotation    Hip external rotation    Knee flexion 3-/5 4+/5  Knee extension 3-+5 4+/5  Ankle dorsiflexion 2+ 5/5  Ankle plantarflexion 2   Ankle inversion 2+   Ankle eversion    (Blank rows = not tested)   TRANSFERS: Assistive device utilized: no device  Sit to stand: SBA Stand to sit: SBA  GAIT: Gait pattern: step through pattern, decreased step length- Left, decreased stance time- Right, decreased ankle dorsiflexion- Right, and genu recurvatum- Right decreased control of the right LE especially the foot Distance walked: 60 ft  Assistive device utilized:no device Level of assistance: SBA Going down staris is one at a time  FUNCTIONAL TESTs:  TUG 20 seconds no device 5XSTS = 22 seconds Berg 47/56  Treatment 10/23/22 Large lap int he back and then a small one, some shortness of breath Resisted gait Ankle exercises in sitting and standing Reviewed HEP, safety in the home and out, how to start a walking program Feet on ball K2C, trunk and core stabilization work TUG 12 seconds no device Berg 50/56   10/08/22 Large lap around the back  building good pace On airex balance beam side stepping and tandem walk On airex pad 10# straight arm pulls On airex cone toe touches Volley ball on and off airex Right foot on sit fit ankle motions Yellow tband right ankle x15 each Supine feet on ball K2C, rotation, bridges and isometric abs PROM of the right arm and shoulder, gentle STM of the scar right chest/flank  10/03/22 Worked on the right shoulder ROM, passive stretch some right lateral trunk stretching and STM over the scars to help with ROM and overall movements Passive stretch calves, HS and piriformis Feet on ball K2C, trunk rotation, small bridge and iso abs Modified planks 5 x 5s Quadraped leg lift, arm lift and alternating bird dogs Elliptical level 4 x 3 minutes Side step on and off airex Sit to stand with weighted ball push Minitramp march and bounce  09/24/22 NuStep L5 x6 Gait around the back building a large lap and a small lap she got short of breath twice and had one very short rest while standing 35# HS curls 2x15 Leg extension 25# 2x15 Rows 15lb 2x10 Side step on and of airex  Standing on airex shoulder Ext 10lb 2x10   09/18/22 Bike level 3 x 6 minutes Gait around the back building a large lap and a small lap she got short of breath twice and had one very short rest while standing 35# HS curls 2x15 Leg extension 25# 2x15 On airex 5# straight arm pulls 20# seated row 20# lats Doorways stretch 3 ways for opening the chest One more large lap around the back building  09/14/22 Walking on beam forwards and side steps  STS on airex 2x10  Leg ext 35# 2x10  HS curl 45#  2x10 NuStep L6 x6 LE only Lateral step ups 4"    09/04/22 Bike L3 x62mins with 2 power bursts Stepping in and out of 6" box  Treadmill pushes 30s x3  Small jumps in place x5  Walking with ball toss and direction changes  Leg press 60# 2x10, 20# 2x10  Calf raises on leg press 20# 2x20   08/28/22 Walking outdoors big lap Stair training  in other Engineer, agricultural board standing Cone taps on airex STS on airex with ball catch 2x10  Step overs 6"    08/22/22 PATIENT EDUCATION: Education details: PT POC, Eval results, Person educated: Patient Education method: education Education comprehension: verbalized understanding and returned demonstration   HOME EXERCISE PROGRAM:  GOALS: Goals reviewed with patient? Yes  SHORT TERM GOALS: Target date: 08/14/22  Pt will be independent with HEP for improved strength, balance, gait. Baseline:  Initial HEP from CIR Goal status: MET  LONG TERM GOALS: Target date: 10/28/22  Pt will be independent with HEP for improved strength, balance, transfers, and gait. Baseline:  Initial HEP from CIR Goal status:  met 10/23/22  2.  Pt will improve 5x sit<>stand to less than or equal to 15 sec to demonstrate improved functional strength and transfer efficiency. Baseline: 22 seconds (scores >15 sec indicate fall risk) Goal status: met 14 seconds 10/23/22  3.  Pt will improve TUG score to less than or equal to 16 sec for decreased fall risk. Baseline: 20 seconds (indicates increased fall risk and difficulty with ADLs in home) Goal status: met 10/23/22  4.  Increase Berg balance score to 50/56 Baseline: 47/56 Goal status: met 10/23/22  5.  Increase right ankle DF to 8 degress Baseline: 0 degrees Goal status: met  ASSESSMENT:  CLINICAL IMPRESSION: Patient went to [place] and used a scooter but was able to do much more than expected, she also is reporting that she was able to do some yardwork, has had a recent fall due to a new puppy but was able to get up on her own.  She overall has no right shoulder , chest pain from the previous fall.  OBJECTIVE IMPAIRMENTS Abnormal gait, decreased balance, decreased mobility, difficulty walking, decreased ROM, decreased strength, impaired flexibility, and impaired tone.   ACTIVITY LIMITATIONS standing, transfers, locomotion level, and caring for  others  PARTICIPATION LIMITATIONS: meal prep, driving, shopping, community activity, and occupation  PERSONAL FACTORS 3+ comorbidities: see above for PMH  are also affecting patient's functional outcome.   REHAB POTENTIAL: Good  CLINICAL DECISION MAKING: Evolving/moderate complexity  EVALUATION COMPLEXITY: Moderate  PLAN: PT FREQUENCY: 2x/week  PT DURATION: 12 weeks plus eval  PLANNED INTERVENTIONS: Therapeutic exercises, Therapeutic activity, Neuromuscular re-education, Balance training, Gait training, Patient/Family education, Joint mobilization, Orthotic/Fit training, DME instructions, Aquatic Therapy, and Manual therapy  PLAN FOR NEXT SESSION:  D/C goals met Jearld Lesch, PT 10/23/2022, 11:24 AM

## 2022-10-29 ENCOUNTER — Inpatient Hospital Stay: Payer: Medicaid Other | Attending: Hematology and Oncology | Admitting: Internal Medicine

## 2022-10-29 VITALS — BP 138/95 | HR 82 | Temp 97.7°F | Resp 20 | Wt 294.7 lb

## 2022-10-29 DIAGNOSIS — Z803 Family history of malignant neoplasm of breast: Secondary | ICD-10-CM | POA: Insufficient documentation

## 2022-10-29 DIAGNOSIS — Z90712 Acquired absence of cervix with remaining uterus: Secondary | ICD-10-CM | POA: Insufficient documentation

## 2022-10-29 DIAGNOSIS — Z807 Family history of other malignant neoplasms of lymphoid, hematopoietic and related tissues: Secondary | ICD-10-CM | POA: Insufficient documentation

## 2022-10-29 DIAGNOSIS — Z809 Family history of malignant neoplasm, unspecified: Secondary | ICD-10-CM | POA: Insufficient documentation

## 2022-10-29 DIAGNOSIS — Z87891 Personal history of nicotine dependence: Secondary | ICD-10-CM | POA: Insufficient documentation

## 2022-10-29 DIAGNOSIS — Z170 Estrogen receptor positive status [ER+]: Secondary | ICD-10-CM | POA: Diagnosis not present

## 2022-10-29 DIAGNOSIS — Z9079 Acquired absence of other genital organ(s): Secondary | ICD-10-CM | POA: Insufficient documentation

## 2022-10-29 DIAGNOSIS — Z9013 Acquired absence of bilateral breasts and nipples: Secondary | ICD-10-CM | POA: Diagnosis not present

## 2022-10-29 DIAGNOSIS — C7931 Secondary malignant neoplasm of brain: Secondary | ICD-10-CM | POA: Diagnosis not present

## 2022-10-29 DIAGNOSIS — C50412 Malignant neoplasm of upper-outer quadrant of left female breast: Secondary | ICD-10-CM | POA: Diagnosis present

## 2022-10-29 NOTE — Progress Notes (Signed)
[facility] at [facility] [address]  [place], [place] [address] [phone]   New Patient Evaluation  Date of Service: 10/29/22 Patient Name: Sheryl Mcmillan Patient MRN: [id number] Patient DOB: 05-Dec-1975 Provider: Henreitta Leber, MD  Identifying Statement:  Sheryl Mcmillan is a 47 y.o. female with Metastatic cancer to brain [facility]) who presents for initial consultation and evaluation regarding cancer associated neurologic deficits.    Referring Provider: Rema Fendt, NP [address] [place],  [place] [address]  Primary Cancer:  Oncologic History: Oncology History  Malignant neoplasm of upper-outer quadrant of left breast in female, estrogen receptor positive (HCC)  05/26/2020 Initial Diagnosis   Malignant neoplasm of upper-outer quadrant of left breast in female, estrogen receptor positive (HCC)   06/16/2020 - 09/30/2020 Chemotherapy      Patient is on Antibody Plan: BREAST TRASTUZUMAB Q21D    06/16/2020 Genetic Testing   Positive genetic testing: 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  Variant of uncertain significance in MSH3 at c.2724A>G (Silent).  No other pathogenic or uncertain variants were reported in the [facility] Multi-Cancer Panel.  The report date is June 16, 2020.    The variant of uncertain significance (VUS) in MSH3 at c.2724A>G (Silent) has been reclassified to likely benign.  The change in variant classification was made as a result of re-review of evidence in light of new variant interpretation guidelines and/or new information. The amended report date is January 10, 2021.   The Multi-Cancer Panel offered by [facility] includes sequencing and/or deletion duplication testing of the following 85 genes: AIP, ALK, APC, ATM, AXIN2,BAP1,  BARD1, BLM, BMPR1A, BRCA1, BRCA2, BRIP1, CASR, CDC73, CDH1, CDK4, CDKN1B, CDKN1C, CDKN2A (p14ARF), CDKN2A (p16INK4a), CEBPA, CHEK2, CTNNA1, DICER1, DIS3L2, EGFR  (c.2369C>T, p.Thr790Met variant only), EPCAM (Deletion/duplication testing only), FH, FLCN, GATA2, GPC3, GREM1 (Promoter region deletion/duplication testing only), HOXB13 (c.251G>A, p.Gly84Glu), HRAS, KIT, MAX, MEN1, MET, MITF (c.952G>A, p.Glu318Lys variant only), MLH1, MSH2, MSH3, MSH6, MUTYH, NBN, NF1, NF2, NTHL1, PALB2, PDGFRA, PHOX2B, PMS2, POLD1, POLE, POT1, PRKAR1A, PTCH1, PTEN, RAD50, RAD51C, RAD51D, RB1, RECQL4, RET, RNF43, RUNX1, SDHAF2, SDHA (sequence changes only), SDHB, SDHC, SDHD, SMAD4, SMARCA4, SMARCB1, SMARCE1, STK11, SUFU, TERC, TERT, TMEM127, TP53, TSC1, TSC2, VHL, WRN and WT1.    10/20/2020 - 10/20/2020 Chemotherapy    Patient is on Treatment Plan: BREAST  DOCETAXEL + CARBOPLATIN + TRASTUZUMAB + PERTUZUMAB  (TCHP) Q21D       10/20/2020 - 06/23/2021 Chemotherapy   Patient is on Treatment Plan : BREAST Trastuzumab q21d     11/10/2021 - 11/10/2021 Radiation Therapy   Site Technique Total Dose (Gy) Dose per Fx (Gy) Completed Fx Beam Energies  Brain: Brain PTV_1_Superior_58mm IMRT 18/18 18 1/1 6XFFF      CNS Oncologic History 11/10/21: Pre-op SRS L frontal (Moody) 11/12/21: L frontal craniotomy, resection (Nundkumar) 07/10/22: Salvage SRS R frontal (Moody)  History of Present Illness: The patient's records from the referring physician were obtained and reviewed and the patient interviewed to confirm this HPI.  Sheryl Mcmillan presents today following recent MRI brain.  She describes some periods of word finding difficulty.  This has improved since she stopped working and lowered her stress level.  No other new or progressive changes.  Denies seizures, headaches.  Medications: Current Outpatient Medications on File Prior to Visit  Medication Sig Dispense Refill   LORazepam (ATIVAN) 1 MG tablet Take one po 30 min prior to mri or radiation 7 tablet 0   baclofen (LIORESAL)  10 MG tablet Take 1 tablet (10 mg total) by mouth 3 (three) times daily. (Patient not taking: Reported on  07/05/2022) 30 each 1   naproxen (NAPROSYN) 500 MG tablet Take 1 tablet (500 mg total) by mouth 2 (two) times daily. 30 tablet 0   No current facility-administered medications on file prior to visit.    Allergies:  Allergies  Allergen Reactions   Carboplatin Hives and Itching    Carboplatin reaction after dose #5 requiring Benadryl, Solumedrol, epinephrine, Claritin.   Other Hives    Baking Soda   Wound Dressing Adhesive Hives    Rips skin, prefers paper tape   Past Medical History:  Past Medical History:  Diagnosis Date   Arthritis    Breast cancer (HCC)    Family history of non-Hodgkin's lymphoma 06/01/2020   GERD (gastroesophageal reflux disease)    Hemorrhoids    Migraines    PCOS (polycystic ovarian syndrome)    PONV (postoperative nausea and vomiting)    Past Surgical History:  Past Surgical History:  Procedure Laterality Date   APPLICATION OF CRANIAL NAVIGATION Left 11/10/2021   Procedure: APPLICATION OF CRANIAL NAVIGATION;  Surgeon: [name], MD;  Location: [facility] OR;  Service: Neurosurgery;  Laterality: Left;   AXILLARY SENTINEL NODE BIOPSY Left 11/07/2020   Procedure: LEFT AXILLARY SENTINEL NODE BIOPSY;  Surgeon: [name], MD;  Location: [facility] OR;  Service: General;  Laterality: Left;   BREAST CYST EXCISION Right 09/22/2021   Procedure: Excision of right axillary excess soft tissue.;  Surgeon: [name], MD;  Location: [facility];  Service: Plastics;  Laterality: Right;   BREAST RECONSTRUCTION WITH PLACEMENT OF TISSUE EXPANDER AND FLEX HD (ACELLULAR HYDRATED DERMIS) Bilateral 11/07/2020   Procedure: BILATERAL BREAST RECONSTRUCTION WITH PLACEMENT OF TISSUE EXPANDER AND FLEX HD (ACELLULAR HYDRATED DERMIS);  Surgeon: [name], MD;  [facility] OR;  Service: Plastics;  Laterality: Bilateral;   COSMETIC SURGERY     CRANIOTOMY Left 11/10/2021   Procedure: LT FRONTAL CRANIOTOMY TUMOR EXCISION;  Surgeon: [name], MD;  Location:  [facility] OR;  Service: Neurosurgery;  Laterality: Left;   DILATION AND CURETTAGE OF UTERUS     HEMORRHOID SURGERY     IR IMAGING GUIDED PORT INSERTION  06/15/2020   LIPOSUCTION WITH LIPOFILLING Bilateral 09/22/2021   Procedure: Fat grafting bilateral breast;  Surgeon: [name], MD;  Location: [facility];  Service: Plastics;  Laterality: Bilateral;   MASS EXCISION Bilateral 04/25/2021   Procedure: excision of bilateral axillary breast tissue;  Surgeon: [name], MD;  Location: [facility];  Service: Plastics;  Laterality: Bilateral;   PORT-A-CATH REMOVAL Right 11/07/2020   Procedure: REMOVAL PORT-A-CATH;  Surgeon: [name], MD;  Location: [facility] OR;  Service: General;  Laterality: Right;   REMOVAL OF BILATERAL TISSUE EXPANDERS WITH PLACEMENT OF BILATERAL BREAST IMPLANTS Bilateral 04/25/2021   Procedure: REMOVAL OF BILATERAL TISSUE EXPANDERS WITH PLACEMENT OF BILATERAL BREAST IMPLANTS;  Surgeon: [name], MD;  Location: [facility];  Service: Plastics;  Laterality: Bilateral;   ROBOTIC ASSISTED TOTAL HYSTERECTOMY WITH BILATERAL SALPINGO OOPHERECTOMY Bilateral 03/14/2021   Procedure: XI ROBOTIC ASSISTED TOTAL HYSTERECTOMY WITH BILATERAL SALPINGO OOPHORECTOMY;  Surgeon: [name], MD;  Location: [facility];  Service: Gynecology;  Laterality: Bilateral;   TOTAL MASTECTOMY Bilateral 11/07/2020   Procedure: BILATERAL MASTECTOMY;  Surgeon: [name], MD;  Location: [facility] OR;  Service: General;  Laterality: Bilateral;   Social History:  Social History   Socioeconomic  History   Marital status: Single    Spouse name: Not on file   Number of children: 1   Years of education: Not on file   Highest education level: Some college, no degree  Occupational History   Not on file  Tobacco Use   Smoking status: Former    Packs/day: 0.50    Years: 10.00    Additional pack years: 0.00    Total pack years: 5.00    Types: Cigarettes    Quit  date: 08/23/2020    Years since quitting: 2.1    Passive exposure: Never   Smokeless tobacco: Never  Vaping Use   Vaping Use: Never used  Substance and Sexual Activity   Alcohol use: Not Currently    Comment: rare   Drug use: Yes    Types: Marijuana    Comment: Last use 11/07/21   Sexual activity: Yes    Birth control/protection: Surgical    Comment: Hysterectomy  Other Topics Concern   Not on file  Social History Narrative   Not on file   Social Determinants of Health   Financial Resource Strain: Not on file  Food Insecurity: No Food Insecurity (10/15/2022)   Hunger Vital Sign    Worried About Running Out of Food in the Last Year: Never true    Ran Out of Food in the Last Year: Never true  Transportation Needs: No Transportation Needs (05/05/2020)   PRAPARE - Administrator, Civil Service (Medical): No    Lack of Transportation (Non-Medical): No  Physical Activity: Not on file  Stress: Not on file  Social Connections: Not on file  Intimate Partner Violence: Not At Risk (10/15/2022)   Humiliation, Afraid, Rape, and Kick questionnaire    Fear of Current or Ex-Partner: No    Emotionally Abused: No    Physically Abused: No    Sexually Abused: No   Family History:  Family History  Problem Relation Age of Onset   Hypertension Mother    Diabetes Mother    Cancer Mother 67       unknown primary   Other Mother        brain tumor; dx 84s   Non-Hodgkin's lymphoma Brother 28   Other Maternal Uncle 5       brain tumor   Breast cancer Other        MGM's niece; dx late 24s   Pancreatic cancer Neg Hx    Colon cancer Neg Hx    Endometrial cancer Neg Hx    Prostate cancer Neg Hx    Ovarian cancer Neg Hx     Review of Systems: Constitutional: Doesn't report fevers, chills or abnormal weight loss Eyes: Doesn't report blurriness of vision Ears, nose, mouth, throat, and face: Doesn't report sore throat Respiratory: Doesn't report cough, dyspnea or  wheezes Cardiovascular: Doesn't report palpitation, chest discomfort  Gastrointestinal:  Doesn't report nausea, constipation, diarrhea GU: Doesn't report incontinence Skin: Doesn't report skin rashes Neurological: Per HPI Musculoskeletal: Doesn't report joint pain Behavioral/Psych: Doesn't report anxiety  Physical Exam: Vitals:   10/29/22 1413  BP: (!) 138/95  Pulse: 82  Resp: 20  Temp: 97.7 F (36.5 C)  SpO2: 98%   KPS: 90. General: Alert, cooperative, pleasant, in no acute distress Head: Normal EENT: No conjunctival injection or scleral icterus.  Lungs: Resp effort normal Cardiac: Regular rate Abdomen: Non-distended abdomen Skin: No rashes cyanosis or petechiae. Extremities: No clubbing or edema  Neurologic Exam: Mental Status: Awake, alert, attentive  to examiner. Oriented to self and environment. Language is fluent with intact comprehension.  Cranial Nerves: Visual acuity is grossly normal. Visual fields are full. Extra-ocular movements intact. No ptosis. Face is symmetric Motor: Tone and bulk are normal. Power is full in both arms and legs. Reflexes are symmetric, no pathologic reflexes present.  Sensory: Intact to light touch Gait: Normal.   Labs: I have reviewed the data as listed    Component Value Date/Time   NA 135 12/08/2021 0506   K 4.6 12/08/2021 0506   CL 104 12/08/2021 0506   CO2 19 (L) 12/08/2021 0506   GLUCOSE 226 (H) 12/08/2021 0506   BUN 10 07/05/2022 1158   CREATININE 0.63 07/05/2022 1158   CALCIUM 8.4 (L) 12/08/2021 0506   PROT 6.0 (L) 11/18/2021 0526   ALBUMIN 3.0 (L) 11/18/2021 0526   AST 29 11/18/2021 0526   AST 16 10/17/2021 1112   ALT 69 (H) 11/18/2021 0526   ALT 21 10/17/2021 1112   ALKPHOS 69 11/18/2021 0526   BILITOT 0.5 11/18/2021 0526   BILITOT 0.3 10/17/2021 1112   GFRNONAA >60 07/05/2022 1158   GFRAA >60 06/06/2019 1205   Lab Results  Component Value Date   WBC 7.2 12/08/2021   NEUTROABS 13.2 (H) 11/20/2021   HGB 13.3  12/08/2021   HCT 40.0 12/08/2021   MCV 85.7 12/08/2021   PLT 162 12/08/2021    Imaging:  MR Brain W Wo Contrast  Result Date: 10/15/2022 CLINICAL DATA:  Brain Mets EXAM: MRI HEAD WITHOUT AND WITH CONTRAST TECHNIQUE: Multiplanar, multiecho pulse sequences of the brain and surrounding structures were obtained without and with intravenous contrast. CONTRAST:  10mL GADAVIST GADOBUTROL 1 MMOL/ML IV SOLN COMPARISON:  MR Head 06/27/22 FINDINGS: Brain: Negative for an acute infarct. No hemorrhage. No extra-axial fluid collection. No hydrocephalus. Sequela of mild overall chronic microvascular ischemic change. Interval resolution of the previously seen contrast-enhancing lesion in the right precentral gyrus. No residual contrast enhancement or FLAIR signal is seen at the site. Postsurgical changes from left frontal craniotomy. Overall there is no significant change in the appearance contrast enhancement seen along the parasagittal posterior left frontal lobe with the area of contrast enhancement now measuring 8 x 4 mm, previously 8 x 4 mm. FLAIR signal surrounding the resection site is also unchanged from prior exam. Vascular: Normal flow voids. Skull and upper cervical spine: Postsurgical changes from left frontal craniotomy. Sinuses/Orbits: Trace right mastoid effusion. No middle ear effusion. Polypoid mucosal thickening left maxillary sinus. Mild mucosal thickening bilateral maxillary, ethmoid, right sphenoid sinus. Orbits are unremarkable. There is complete opacification of the left sphenoid sinus, unchanged from 01/15/2022. Other: No IMPRESSION: 1. Interval resolution of the previously seen contrast-enhancing lesion in the right precentral gyrus. No residual contrast enhancement or FLAIR signal is seen at the site. 2. Postsurgical changes from left frontal craniotomy. No significant change in the appearance of contrast enhancement seen along the parasagittal posterior left frontal lobe with the area of contrast  enhancement now measuring 8 x 4 mm, previously 8 x 4 mm. FLAIR signal surrounding the resection site is also unchanged. 3. No new lesions visualized Electronically Signed   By: Lorenza Cambridge M.D.   On: 10/15/2022 14:18    [facility] Clinician Interpretation: I have personally reviewed the radiological images as listed.  My interpretation, in the context of the patient's clinical presentation, is stable disease   Assessment/Plan Metastatic cancer to brain [facility])  Sheryl Mcmillan is clinically stable today.  MRI brain demonstrates  stable findings, now 3 months s/p salvage R frontal SRS.   We spent twenty additional minutes teaching regarding the natural history, biology, and historical experience in the treatment of neurologic complications of cancer.   We appreciate the opportunity to participate in the care of [facility].   We ask that Sheryl Mcmillan return to clinic in 3 months following next brain MRI, or sooner as needed.  All questions were answered. The patient knows to call the clinic with any problems, questions or concerns. No barriers to learning were detected.  The total time spent in the encounter was 40 minutes and more than 50% was on counseling and review of test results   Henreitta Leber, MD Medical Director of Neuro-Oncology [facility] at [facility] 10/29/22 2:07 PM

## 2022-11-01 ENCOUNTER — Other Ambulatory Visit: Payer: Self-pay | Admitting: Radiation Therapy

## 2022-11-19 NOTE — Progress Notes (Signed)
Patient Care Team: Rema Fendt, NP as PCP - General (Nurse Practitioner) [name], MD as Consulting Physician (General Surgery) Antony Blackbird, MD as Consulting Physician (Radiation Oncology) [name], MD as Consulting Physician (Plastic Surgery) Serena Croissant, MD as Consulting Physician (Hematology and Oncology) [name], MD as Consulting Physician (Neurosurgery)  DIAGNOSIS:  Encounter Diagnoses  Name Primary?   Metastatic cancer to brain [facility]) Yes   Malignant neoplasm of upper-outer quadrant of left breast in female, estrogen receptor positive (HCC)     SUMMARY OF ONCOLOGIC HISTORY: Oncology History  Malignant neoplasm of upper-outer quadrant of left breast in female, estrogen receptor positive (HCC)  05/26/2020 Initial Diagnosis   Malignant neoplasm of upper-outer quadrant of left breast in female, estrogen receptor positive (HCC)   06/01/2020 Cancer Staging   Staging form: Breast, AJCC 8th Edition - Clinical stage from 06/01/2020: Stage IIA (cT2, cN0, cM0, G3, ER+, PR-, HER2+) - Signed by Rachel Moulds, MD on 11/21/2022 Stage prefix: Initial diagnosis Histologic grading system: 3 grade system   06/16/2020 - 09/30/2020 Chemotherapy      Patient is on Antibody Plan: BREAST TRASTUZUMAB Q21D    06/16/2020 Genetic Testing   Positive genetic testing: 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  Variant of uncertain significance in MSH3 at c.2724A>G (Silent).  No other pathogenic or uncertain variants were reported in the [facility] Multi-Cancer Panel.  The report date is June 16, 2020.    The variant of uncertain significance (VUS) in MSH3 at c.2724A>G (Silent) has been reclassified to likely benign.  The change in variant classification was made as a result of re-review of evidence in light of new variant interpretation guidelines and/or new information. The amended report date is January 10, 2021.   The Multi-Cancer Panel offered by  [facility] includes sequencing and/or deletion duplication testing of the following 85 genes: AIP, ALK, APC, ATM, AXIN2,BAP1,  BARD1, BLM, BMPR1A, BRCA1, BRCA2, BRIP1, CASR, CDC73, CDH1, CDK4, CDKN1B, CDKN1C, CDKN2A (p14ARF), CDKN2A (p16INK4a), CEBPA, CHEK2, CTNNA1, DICER1, DIS3L2, EGFR (c.2369C>T, p.Thr790Met variant only), EPCAM (Deletion/duplication testing only), FH, FLCN, GATA2, GPC3, GREM1 (Promoter region deletion/duplication testing only), HOXB13 (c.251G>A, p.Gly84Glu), HRAS, KIT, MAX, MEN1, MET, MITF (c.952G>A, p.Glu318Lys variant only), MLH1, MSH2, MSH3, MSH6, MUTYH, NBN, NF1, NF2, NTHL1, PALB2, PDGFRA, PHOX2B, PMS2, POLD1, POLE, POT1, PRKAR1A, PTCH1, PTEN, RAD50, RAD51C, RAD51D, RB1, RECQL4, RET, RNF43, RUNX1, SDHAF2, SDHA (sequence changes only), SDHB, SDHC, SDHD, SMAD4, SMARCA4, SMARCB1, SMARCE1, STK11, SUFU, TERC, TERT, TMEM127, TP53, TSC1, TSC2, VHL, WRN and WT1.    10/20/2020 - 10/20/2020 Chemotherapy    Patient is on Treatment Plan: BREAST  DOCETAXEL + CARBOPLATIN + TRASTUZUMAB + PERTUZUMAB  (TCHP) Q21D       10/20/2020 - 06/23/2021 Chemotherapy   Patient is on Treatment Plan : BREAST Trastuzumab q21d     11/10/2021 - 11/10/2021 Radiation Therapy   Site Technique Total Dose (Gy) Dose per Fx (Gy) Completed Fx Beam Energies  Brain: Brain PTV_1_Superior_67mm IMRT 18/18 18 1/1 6XFFF       CHIEF COMPLIANT: Metastatic breast cancer/ Scans  INTERVAL HISTORY: PROMIZE AREVALOS is a 47 y.o with the above mention estrogen receptor negative, Her2 positive breast cancer (s/p bilateral mastectomies); BRCA1+.  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  She presents to the clinic for a follow-up.   ALLERGIES:  is allergic to carboplatin, other, and wound dressing adhesive.  MEDICATIONS:  Current Outpatient Medications  Medication Sig Dispense Refill   LORazepam (ATIVAN) 1 MG tablet Take one po 30  min prior to mri or radiation (Patient not taking: Reported on 10/29/2022) 7 tablet 0   No  current facility-administered medications for this visit.    PHYSICAL EXAMINATION: ECOG PERFORMANCE STATUS: 1 - Symptomatic but completely ambulatory  Vitals:   11/30/22 1137  BP: 123/76  Pulse: 96  Resp: 19  Temp: 98.1 F (36.7 C)  SpO2: 99%   Filed Weights   11/30/22 1137  Weight: 288 lb 5 oz (130.8 kg)      LABORATORY DATA:  I have reviewed the data as listed    Latest Ref Rng & Units 07/05/2022   11:58 AM 12/08/2021    5:06 AM 12/01/2021    6:21 AM  CMP  Glucose 70 - 99 mg/dL  161  096   BUN 6 - 20 mg/dL 10  16  11    Creatinine 0.44 - 1.00 mg/dL 0.45  4.09  8.11   Sodium 135 - 145 mmol/L  135  137   Potassium 3.5 - 5.1 mmol/L  4.6  3.6   Chloride 98 - 111 mmol/L  104  105   CO2 22 - 32 mmol/L  19  21   Calcium 8.9 - 10.3 mg/dL  8.4  8.9     Lab Results  Component Value Date   WBC 7.2 12/08/2021   HGB 13.3 12/08/2021   HCT 40.0 12/08/2021   MCV 85.7 12/08/2021   PLT 162 12/08/2021   NEUTROABS 13.2 (H) 11/20/2021    ASSESSMENT & PLAN:  Malignant neoplasm of upper-outer quadrant of left breast in female, estrogen receptor positive (HCC) 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, PR negative, Ki-67 50%, HER2 positive, genetics: BRCA1 mutation 06/16/2020: Neoadjuvant chemotherapy with TCH Perjeta but Perjeta was discontinued after cycle 1, Herceptin maintenance completed December 2022 11/07/2020: Bilateral mastectomies: Residual grade 3 IDC left breast negative margins, triple negative with a Ki-67 of 15% 03/14/2021: Bilateral salpingo-oophorectomy   Hospitalization 11/17/2021-12/09/2021 partial seizures, brain MRI left frontal metastases resection of the tumor metastatic breast cancer HER2 positive: Right lower extremity weakness CT CAP 11/12/2021: No distant metastatic disease. CT CAP 05/18/22: mild non specific inc in para tracheal LN Nonspecific, stable 5 mm LUL nodule Bone scan: 05/18/22: L2 compression fracture, no bone mets CT chest done  08/22/2022: No evidence of metastatic disease, fatty liver Brain MRI 06/29/2022: New 2 mm metastasis right frontal lobe (SRS on 07/10/2022)   We talked about olaparib for metastatic breast cancer because there is no evidence of disease and her CT chest abdomen pelvis we decided to hold off on monitor. CT CAP 11/30/2022: No evidence of metastatic disease fractures right 3rd-6th rib (recent fall)  She learned that brain metastases indicates that she has metastatic breast cancer and she got very scared about it. Multiple cutaneous lesions: On the chest legs etc.  She is concerned of cutaneous metastases versus skin cancer.  We will refer her to dermatology.  Return to clinic in 3 months for follow-up after CT chest abdomen pelvis.    Orders Placed This Encounter  Procedures   CT CHEST ABDOMEN PELVIS W CONTRAST    Standing Status:   Future    Standing Expiration Date:   11/30/2023    Order Specific Question:   If indicated for the ordered procedure, I authorize the administration of contrast media per Radiology protocol    Answer:   Yes    Order Specific Question:   Does the patient have a contrast media/X-ray dye allergy?  Answer:   No    Order Specific Question:   Is patient pregnant?    Answer:   No    Order Specific Question:   Preferred imaging location?    Answer:   [facility]    Order Specific Question:   If indicated for the ordered procedure, I authorize the administration of oral contrast media per Radiology protocol    Answer:   Yes   The patient has a good understanding of the overall plan. she agrees with it. she will call with any problems that may develop before the next visit here. Total time spent: 30 mins including face to face time and time spent for planning, charting and co-ordination of care   Tamsen Meek, MD 11/30/22  She has got suspicion for suspicion for skin cancer  I Deritra, Mcnairy am acting as a Neurosurgeon for Dr.Faustine Tates  I have reviewed the  above documentation for accuracy and completeness, and I agree with the above.

## 2022-11-27 ENCOUNTER — Ambulatory Visit (HOSPITAL_COMMUNITY)
Admission: RE | Admit: 2022-11-27 | Discharge: 2022-11-27 | Disposition: A | Discharge status: Home / Self Care | Payer: Medicaid Other | Source: Ambulatory Visit | Attending: Hematology and Oncology | Admitting: Hematology and Oncology

## 2022-11-27 DIAGNOSIS — C50412 Malignant neoplasm of upper-outer quadrant of left female breast: Secondary | ICD-10-CM | POA: Diagnosis present

## 2022-11-27 DIAGNOSIS — Z170 Estrogen receptor positive status [ER+]: Secondary | ICD-10-CM | POA: Diagnosis present

## 2022-11-27 MED ORDER — IOHEXOL 300 MG/ML  SOLN
100.0000 mL | Freq: Once | INTRAMUSCULAR | Status: AC | PRN
  Administered 2022-11-27: 100 mL via INTRAVENOUS

## 2022-11-27 MED ORDER — SODIUM CHLORIDE (PF) 0.9 % IJ SOLN
INTRAMUSCULAR | Status: AC
  Filled 2022-11-27: qty 50

## 2022-11-30 ENCOUNTER — Inpatient Hospital Stay: Payer: Medicaid Other | Attending: Hematology and Oncology | Admitting: Hematology and Oncology

## 2022-11-30 ENCOUNTER — Inpatient Hospital Stay: Payer: Medicaid Other | Admitting: Licensed Clinical Social Worker

## 2022-11-30 VITALS — BP 123/76 | HR 96 | Temp 98.1°F | Resp 19 | Ht 71.0 in | Wt 288.3 lb

## 2022-11-30 DIAGNOSIS — Z9013 Acquired absence of bilateral breasts and nipples: Secondary | ICD-10-CM | POA: Diagnosis not present

## 2022-11-30 DIAGNOSIS — Z923 Personal history of irradiation: Secondary | ICD-10-CM | POA: Diagnosis not present

## 2022-11-30 DIAGNOSIS — C7931 Secondary malignant neoplasm of brain: Secondary | ICD-10-CM | POA: Diagnosis present

## 2022-11-30 DIAGNOSIS — Z9221 Personal history of antineoplastic chemotherapy: Secondary | ICD-10-CM | POA: Diagnosis not present

## 2022-11-30 DIAGNOSIS — Z170 Estrogen receptor positive status [ER+]: Secondary | ICD-10-CM | POA: Diagnosis not present

## 2022-11-30 DIAGNOSIS — L989 Disorder of the skin and subcutaneous tissue, unspecified: Secondary | ICD-10-CM | POA: Diagnosis not present

## 2022-11-30 DIAGNOSIS — C50412 Malignant neoplasm of upper-outer quadrant of left female breast: Secondary | ICD-10-CM | POA: Insufficient documentation

## 2022-11-30 NOTE — Assessment & Plan Note (Signed)
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 CT CAP 05/18/22: mild non specific inc in para tracheal LN Nonspecific, stable 5 mm LUL nodule Bone scan: 05/18/22: L2 compression fracture, no bone mets CT chest done 08/22/2022: No evidence of metastatic disease, fatty liver Brain MRI 06/29/2022: New 2 mm metastasis right frontal lobe (SRS on 07/10/2022)   We talked about olaparib for metastatic breast cancer because there is no evidence of disease and her CT chest abdomen pelvis we decided to hold off on monitor. CT CAP 11/30/2022: No evidence of metastatic disease fractures right 3rd-6th rib (recent fall)  She learned that brain metastases indicates that she has metastatic breast cancer and she got very scared about it.  Return to clinic in 3 months for follow-up after CT chest abdomen pelvis.

## 2022-11-30 NOTE — Progress Notes (Signed)
[facility] CSW Counseling Note  Patient was referred by self. Treatment type: Individual  Presenting Concerns: Patient and/or family reports the following symptoms/concerns: anxiety, especially around scans and waiting for results Duration of problem: 1 year; Severity of problem: moderate   Orientation:oriented to person, place, time/date, and situation.   Affect: Appropriate, Congruent, and Tearful Risk of harm to self or others: No plan to harm self or others  Patient and/or Family's Strengths/Protective Factors: Ability for insight  Active sense of humor  Communication skills  Motivation for treatment/growth  Special hobby/interest  Supportive family/friends      Goals Addressed: Patient will:  Reduce symptoms of: anxiety Increase knowledge and/or ability of: coping skills  Increase healthy adjustment to current life circumstances   Progress towards Goals: Initial   Interventions: Interventions utilized:  Strength-based, Supportive, and Other: mindfulness       Assessment: Patient currently experiencing stress and anxiety as she continues adjusting to metastatic/stage 4 cancer. Pt tried attending a metastatic breast cancer support group online but it increased her anxiety as other women had metastases in other areas of their bodies and were going on hospice. Pt is trying to focus on living her life and enjoying herself (has monthly trips planned).  Pt is experiencing particularly high anxiety around scans and waiting for results as this is out of her control. Pt is also processing changes in relationships during her treatment. CSW normalized feelings and discussed scanxiety with pt. Discussed strategies to prepare for and manage anxiety during predictable times like scans. Pt identified painting as a calming activity for her. CSW and pt practiced five finger breathing and grounding with five senses today in session and CSW provided mindfulness cards for pt to practice other skills  at home.     Plan: Follow up with CSW: June 17th Behavioral recommendations: practice five finger breathing. Try at least 2 other skills from the mindfulness cards until you find a strategy that you enjoy Referral(s): information on Salena Saner for potential peer mentor       Merlyn Albert, LCSW   Patient is participating in a Managed Medicaid Plan:  Yes

## 2022-12-10 ENCOUNTER — Inpatient Hospital Stay: Payer: Medicaid Other | Admitting: Licensed Clinical Social Worker

## 2022-12-12 ENCOUNTER — Encounter: Payer: Self-pay | Admitting: Radiation Oncology

## 2022-12-15 ENCOUNTER — Telehealth: Payer: Self-pay | Admitting: Hematology and Oncology

## 2022-12-15 NOTE — Telephone Encounter (Signed)
Scheduled appointment per los. Left voicemail for patient.

## 2023-01-16 ENCOUNTER — Other Ambulatory Visit (HOSPITAL_COMMUNITY): Payer: Self-pay

## 2023-01-16 ENCOUNTER — Telehealth: Payer: Self-pay | Admitting: *Deleted

## 2023-01-16 ENCOUNTER — Ambulatory Visit (HOSPITAL_COMMUNITY)
Admission: RE | Admit: 2023-01-16 | Discharge: 2023-01-16 | Disposition: A | Discharge status: Home / Self Care | Payer: Medicaid Other | Source: Ambulatory Visit | Attending: Internal Medicine | Admitting: Internal Medicine

## 2023-01-16 ENCOUNTER — Encounter: Payer: Self-pay | Admitting: Internal Medicine

## 2023-01-16 ENCOUNTER — Other Ambulatory Visit: Payer: Self-pay | Admitting: Radiation Oncology

## 2023-01-16 DIAGNOSIS — C7931 Secondary malignant neoplasm of brain: Secondary | ICD-10-CM | POA: Insufficient documentation

## 2023-01-16 MED ORDER — LORAZEPAM 1 MG PO TABS
ORAL_TABLET | ORAL | 0 refills | Status: DC
  Filled 2023-01-16: qty 4, 4d supply, fill #0
  Filled 2023-01-16: qty 4, 2d supply, fill #0

## 2023-01-16 MED ORDER — GADOBUTROL 1 MMOL/ML IV SOLN
10.0000 mL | Freq: Once | INTRAVENOUS | Status: AC | PRN
  Administered 2023-01-16: 10 mL via INTRAVENOUS

## 2023-01-16 NOTE — Telephone Encounter (Signed)
CALLED PATIENT TO INFORM THAT MEDS[facility] WILL BE READY TO PICK UP, SCRIPT NOT TO BE TAKEN  UNTIL SHE GETS TO MRI @ [facility] AND THEY WILL DO HER MRI ON 01-16-23 @ 2 PM, SPOKE WITH PATIENT AND SHE IS AWARE OF THIS SCAN AND THE INSTRUCTIONS

## 2023-01-17 ENCOUNTER — Other Ambulatory Visit (HOSPITAL_COMMUNITY): Payer: Medicaid Other

## 2023-01-21 ENCOUNTER — Ambulatory Visit
Admission: RE | Admit: 2023-01-21 | Discharge: 2023-01-21 | Disposition: A | Discharge status: Home / Self Care | Payer: Medicaid Other | Source: Ambulatory Visit | Attending: Radiation Oncology | Admitting: Radiation Oncology

## 2023-01-21 ENCOUNTER — Other Ambulatory Visit: Payer: Self-pay | Admitting: Radiation Therapy

## 2023-01-21 ENCOUNTER — Encounter: Payer: Self-pay | Admitting: Radiation Oncology

## 2023-01-21 DIAGNOSIS — C7931 Secondary malignant neoplasm of brain: Secondary | ICD-10-CM

## 2023-01-21 DIAGNOSIS — C50412 Malignant neoplasm of upper-outer quadrant of left female breast: Secondary | ICD-10-CM

## 2023-01-21 NOTE — Progress Notes (Signed)
Radiation Oncology         [phone] ________________________________  Outpatient Follow Up - Conducted via telephone at patient request.  I spoke with the patient to conduct this visit via telephone. The patient was notified in advance and was offered an in person or telemedicine meeting to allow for face to face communication but instead preferred to proceed with a telephone visit.   Name: Sheryl Mcmillan        MRN: [id number]  Date of Service: 01/21/2023 DOB: 06/07/1976  XB:[id number], Salomon Fick, NP  [name], PA-C     REFERRING PHYSICIAN: [name], PA-C   DIAGNOSIS: The primary encounter diagnosis was Malignant neoplasm of upper-outer quadrant of left breast in female, estrogen receptor positive (HCC). A diagnosis of Metastatic cancer to brain [facility]) was also pertinent to this visit.   HISTORY OF PRESENT ILLNESS: Sheryl Mcmillan is a 47 y.o. female with a diagnosis of left breast cancer with brain metastases.  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  She received neoadjuvant chemotherapy, bilateral mastectomies without evidence of disease on the right but in the left residual T1cN1 disease was present, her tumor converted to triple negative.  She underwent hysterectomy with BSO in September 2022.    She was found to have brain disease in May 2023 when she presented with right sided extremity weakness; workup showed a 2.2 cm partially solid left superior hemisphere mass with a large area of vasogenic edema.  She  received preoperative SRS on 11/10/21, and then surgical resection the same day. Final pathology revealed metastatic adenocarcinoma consistent with her known breast primary. An MRI in January 2024 showed a new lesion in the right precentral gyrus measuring 2 mm. She was treated with  single fraction SRS.   A post treatment MRI on 10/11/22 showed stable post treatment changes in the left frontal surgical cavity without significant change in her contrast enhancement. She did have resolution of the previously treated right precentral gyrus lesion. She developed confusion and was seen by Dr. Barbaraann Cao. He felt her symptoms were to be anticipated with the type and location of her brain surgery. She elected to continue her surveillance with our department however, and her most recent MRI on 01/16/23 did not show new disease, and showed a stable left frontoparietal surgical cavity, and did not see the right precentral gyrus lesion that was previously treated. She's contacted today to discuss these results. Of note she has terrible claustrophobia and is successfully able to navigate MRI scans with Ativan 1 mg.    PREVIOUS RADIATION THERAPY:    07/10/2022 through 07/10/2022 SRS Treatment Site Technique Total Dose (Gy) Dose per Fx (Gy) Completed Fx Beam Energies  Brain:   PTV_2_RprecentGyrus_61mm 3D 20/20 20 1/1 6XFFF    11/10/2021 through 11/10/2021 Preoperative SRS Site Technique Total Dose (Gy) Dose per Fx (Gy) Completed Fx Beam Energies  Brain: Brain PTV_1_Superior_29mm IMRT 18/18 18 1/1 6XFFF     PAST MEDICAL HISTORY:  Past Medical History:  Diagnosis Date   Arthritis    Breast cancer (HCC)    Family history of non-Hodgkin's lymphoma 06/01/2020   GERD (gastroesophageal reflux disease)    Hemorrhoids    Migraines    PCOS (polycystic ovarian syndrome)    PONV (postoperative nausea and vomiting)  PAST SURGICAL HISTORY: Past Surgical History:  Procedure Laterality Date   APPLICATION OF CRANIAL NAVIGATION Left 11/10/2021   Procedure: APPLICATION OF CRANIAL NAVIGATION;  Surgeon: [name], MD;  Location: [facility] OR;  Service: Neurosurgery;  Laterality: Left;   AXILLARY SENTINEL NODE BIOPSY Left 11/07/2020   Procedure: LEFT AXILLARY SENTINEL NODE BIOPSY;  Surgeon:  [name], MD;  Location: [facility] OR;  Service: General;  Laterality: Left;   BREAST CYST EXCISION Right 09/22/2021   Procedure: Excision of right axillary excess soft tissue.;  Surgeon: [name], MD;  Location: [facility];  Service: Plastics;  Laterality: Right;   BREAST RECONSTRUCTION WITH PLACEMENT OF TISSUE EXPANDER AND FLEX HD (ACELLULAR HYDRATED DERMIS) Bilateral 11/07/2020   Procedure: BILATERAL BREAST RECONSTRUCTION WITH PLACEMENT OF TISSUE EXPANDER AND FLEX HD (ACELLULAR HYDRATED DERMIS);  Surgeon: [name], MD;  Location: [facility] OR;  Service: Plastics;  Laterality: Bilateral;   COSMETIC SURGERY     CRANIOTOMY Left 11/10/2021   Procedure: LT FRONTAL CRANIOTOMY TUMOR EXCISION;  Surgeon: [name], MD;  Location: [facility] OR;  Service: Neurosurgery;  Laterality: Left;   DILATION AND CURETTAGE OF UTERUS     HEMORRHOID SURGERY     IR IMAGING GUIDED PORT INSERTION  06/15/2020   LIPOSUCTION WITH LIPOFILLING Bilateral 09/22/2021   Procedure: Fat grafting bilateral breast;  Surgeon: [name], MD;  Location: [facility];  Service: Plastics;  Laterality: Bilateral;   MASS EXCISION Bilateral 04/25/2021   Procedure: excision of bilateral axillary breast tissue;  Surgeon: [name], MD;  Location: [facility];  Service: Plastics;  Laterality: Bilateral;   PORT-A-CATH REMOVAL Right 11/07/2020   Procedure: REMOVAL PORT-A-CATH;  Surgeon: [name], MD;  Location: [facility] OR;  Service: General;  Laterality: Right;   REMOVAL OF BILATERAL TISSUE EXPANDERS WITH PLACEMENT OF BILATERAL BREAST IMPLANTS Bilateral 04/25/2021   Procedure: REMOVAL OF BILATERAL TISSUE EXPANDERS WITH PLACEMENT OF BILATERAL BREAST IMPLANTS;  Surgeon: [name], MD;  Location: [facility];  Service: Plastics;  Laterality: Bilateral;   ROBOTIC ASSISTED TOTAL HYSTERECTOMY WITH BILATERAL SALPINGO OOPHERECTOMY Bilateral 03/14/2021   Procedure: XI  ROBOTIC ASSISTED TOTAL HYSTERECTOMY WITH BILATERAL SALPINGO OOPHORECTOMY;  Surgeon: [name], MD;  Location: [facility];  Service: Gynecology;  Laterality: Bilateral;   TOTAL MASTECTOMY Bilateral 11/07/2020   Procedure: BILATERAL MASTECTOMY;  Surgeon: [name], MD;  Location: [facility] OR;  Service: General;  Laterality: Bilateral;     FAMILY HISTORY:  Family History  Problem Relation Age of Onset   Hypertension Mother    Diabetes Mother    Cancer Mother 31       unknown primary   Other Mother        brain tumor; dx 11s   Non-Hodgkin's lymphoma Brother 35   Other Maternal Uncle 28       brain tumor   Breast cancer Other        MGM's niece; dx late 59s   Pancreatic cancer Neg Hx    Colon cancer Neg Hx    Endometrial cancer Neg Hx    Prostate cancer Neg Hx    Ovarian cancer Neg Hx      SOCIAL HISTORY:  reports that she quit smoking about 2 years ago. Her smoking use included cigarettes. She started smoking about 12 years ago. She has a 5 pack-year smoking history. She has never been exposed to tobacco smoke. She has never used smokeless tobacco. She reports that she does not currently  use alcohol. She reports current drug use. Drug: Marijuana. The patient is single and lives in [place]. Her daughter Illene Labrador is often part of her office visits.   ALLERGIES: Carboplatin, Other, and Wound dressing adhesive   MEDICATIONS:  Current Outpatient Medications  Medication Sig Dispense Refill   LORazepam (ATIVAN) 1 MG tablet Take one tablet by mouth  30 min prior to mri or radiation 4 tablet 0   No current facility-administered medications for this encounter.     REVIEW OF SYSTEMS: On review of systems, the patient reports that she is doing okay. She feels that her confusion comes and goes and feels reassured that this is expected when she met with Dr. Barbaraann Cao. No other complaints of headaches, visual or speech, or movement changes are noted. No other complaints are verbalized.      PHYSICAL EXAM:  Unable to assess due to encounter type.   ECOG = 1  0 - Asymptomatic (Fully active, able to carry on all predisease activities without restriction)  1 - Symptomatic but completely ambulatory (Restricted in physically strenuous activity but ambulatory and able to carry out work of a light or sedentary nature. For example, light housework, office work)  2 - Symptomatic, <50% in bed during the day (Ambulatory and capable of all self care but unable to carry out any work activities. Up and about more than 50% of waking hours)  3 - Symptomatic, >50% in bed, but not bedbound (Capable of only limited self-care, confined to bed or chair 50% or more of waking hours)  4 - Bedbound (Completely disabled. Cannot carry on any self-care. Totally confined to bed or chair)  5 - Death   Santiago Glad MM, Creech RH, Tormey DC, et al. [phone]). "Toxicity and response criteria of the [facility]". Am. Evlyn Clines. Oncol. 5 (6): 649-55    LABORATORY DATA:  Lab Results  Component Value Date   WBC 7.2 12/08/2021   HGB 13.3 12/08/2021   HCT 40.0 12/08/2021   MCV 85.7 12/08/2021   PLT 162 12/08/2021   Lab Results  Component Value Date   NA 135 12/08/2021   K 4.6 12/08/2021   CL 104 12/08/2021   CO2 19 (L) 12/08/2021   Lab Results  Component Value Date   ALT 69 (H) 11/18/2021   AST 29 11/18/2021   ALKPHOS 69 11/18/2021   BILITOT 0.5 11/18/2021      RADIOGRAPHY: MR BRAIN W WO CONTRAST  Result Date: 01/21/2023 CLINICAL DATA:  Seen S neoplasm, assess treatment response EXAM: MRI HEAD WITHOUT AND WITH CONTRAST TECHNIQUE: Multiplanar, multiecho pulse sequences of the brain and surrounding structures were obtained without and with intravenous contrast. CONTRAST:  10mL GADAVIST GADOBUTROL 1 MMOL/ML IV SOLN COMPARISON:  10/11/2022 FINDINGS: Brain: Wispy parasagittal enhancement at the left frontal parietal junction at the resection site is unchanged with up to 7 mm span on  1100:236. Regional T2 hyperintensity without swelling is stable. No acute infarct, hemorrhage, hydrocephalus, or collection. No new lesion. Normal brain volume. Vascular: Normal flow voids and vascular enhancement Skull and upper cervical spine: Is unremarkable craniotomy site. 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 IMPRESSION: Stable, non worrisome left frontal treatment site.  No new lesion. Electronically Signed   By: Tiburcio Pea M.D.   On: 01/21/2023 09:42       IMPRESSION/PLAN: 1. 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 She is doing well clinically and radiographically, and we will proceed with repeat MRI in  3 months time. If stable in early 2025, we will move to every 4 months. She is in agreement and will continue to follow up with Dr. Pamelia Hoit as well and is scheduled to see him with scans in September 2024. 2 Confusion. She will let Dr. Barbaraann Cao know if any of her symptoms progress, otherwise we will follow this expectantly.  3. Claustrophobia. She will use Ativan 1 mg po prior to MRI scans or any future radiation.    This encounter was conducted via telephone.  The patient has provided two factor identification and has given verbal consent for this type of encounter and has been advised to only accept a meeting of this type in a secure network environment. The time spent during this encounter was 35 minutes including preparation, discussion, and coordination of the patient's care. The attendants for this meeting include  Ronny Bacon and [employer]. During the encounter,  Ronny Bacon were located at [facility] Radiation Oncology Department.  Trenton L Hyneman  was located at home.     Osker Mason, [facility]   **Disclaimer: This note was  dictated with voice recognition software. Similar sounding words can inadvertently be transcribed and this note may contain transcription errors which may not have been corrected upon publication of note.**

## 2023-01-21 NOTE — Progress Notes (Addendum)
Telephone nursing appointment for patient to review most recent scan results from 01/16/2023. I verified patient's identity x2 and began nursing interview. Patient reports doing well. No issues conveyed at this time.   Meaningful use complete.   Patient aware of their 2:00pm-01/21/2023 telephone appointment w/ Laurence Aly PA-C. I left my extension [phone] in case patient needs anything. Patient verbalized understanding. This concludes the nursing interview.   Patient contact [phone]     Ruel Favors, LPN

## 2023-01-28 ENCOUNTER — Ambulatory Visit: Payer: Medicaid Other | Admitting: Internal Medicine

## 2023-02-06 ENCOUNTER — Inpatient Hospital Stay: Payer: Medicaid Other | Attending: Hematology and Oncology

## 2023-02-06 NOTE — Progress Notes (Signed)
[facility] CSW Progress Note  Clinical Child psychotherapist contacted patient by phone to follow up on message left with Administrative L. Gwynn. Patient stated need for call was to receive assistance with applying for [employer]. Patient applied for application while on the phone. CSW faxed off medical documentation to agency.  Marguerita Merles, LCSWA Clinical Social Worker [facility]

## 2023-02-28 ENCOUNTER — Ambulatory Visit (HOSPITAL_COMMUNITY)
Admission: RE | Admit: 2023-02-28 | Discharge: 2023-02-28 | Disposition: A | Discharge status: Home / Self Care | Payer: Medicaid Other | Source: Ambulatory Visit | Attending: Hematology and Oncology | Admitting: Hematology and Oncology

## 2023-02-28 DIAGNOSIS — C7931 Secondary malignant neoplasm of brain: Secondary | ICD-10-CM | POA: Insufficient documentation

## 2023-02-28 MED ORDER — IOHEXOL 300 MG/ML  SOLN
100.0000 mL | Freq: Once | INTRAMUSCULAR | Status: AC | PRN
  Administered 2023-02-28: 100 mL via INTRAVENOUS

## 2023-03-04 NOTE — Progress Notes (Signed)
Patient Care Team: Rema Fendt, NP as PCP - General (Nurse Practitioner) [name], MD as Consulting Physician (General Surgery) Antony Blackbird, MD as Consulting Physician (Radiation Oncology) [name], MD as Consulting Physician (Plastic Surgery) Serena Croissant, MD as Consulting Physician (Hematology and Oncology) [name], MD as Consulting Physician (Neurosurgery)  DIAGNOSIS: No diagnosis found.  SUMMARY OF ONCOLOGIC HISTORY: Oncology History  Malignant neoplasm of upper-outer quadrant of left breast in female, estrogen receptor positive (HCC)  05/26/2020 Initial Diagnosis   Malignant neoplasm of upper-outer quadrant of left breast in female, estrogen receptor positive (HCC)   06/01/2020 Cancer Staging   Staging form: Breast, AJCC 8th Edition - Clinical stage from 06/01/2020: Stage IIA (cT2, cN0, cM0, G3, ER+, PR-, HER2+) - Signed by Rachel Moulds, MD on 11/21/2022 Stage prefix: Initial diagnosis Histologic grading system: 3 grade system   06/16/2020 - 09/30/2020 Chemotherapy      Patient is on Antibody Plan: BREAST TRASTUZUMAB Q21D    06/16/2020 Genetic Testing   Positive genetic testing: 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  Variant of uncertain significance in MSH3 at c.2724A>G (Silent).  No other pathogenic or uncertain variants were reported in the [facility] Multi-Cancer Panel.  The report date is June 16, 2020.    The variant of uncertain significance (VUS) in MSH3 at c.2724A>G (Silent) has been reclassified to likely benign.  The change in variant classification was made as a result of re-review of evidence in light of new variant interpretation guidelines and/or new information. The amended report date is January 10, 2021.   The Multi-Cancer Panel offered by [facility] includes sequencing and/or deletion duplication testing of the following 85 genes: AIP, ALK, APC, ATM, AXIN2,BAP1,  BARD1, BLM, BMPR1A, BRCA1, BRCA2, BRIP1,  CASR, CDC73, CDH1, CDK4, CDKN1B, CDKN1C, CDKN2A (p14ARF), CDKN2A (p16INK4a), CEBPA, CHEK2, CTNNA1, DICER1, DIS3L2, EGFR (c.2369C>T, p.Thr790Met variant only), EPCAM (Deletion/duplication testing only), FH, FLCN, GATA2, GPC3, GREM1 (Promoter region deletion/duplication testing only), HOXB13 (c.251G>A, p.Gly84Glu), HRAS, KIT, MAX, MEN1, MET, MITF (c.952G>A, p.Glu318Lys variant only), MLH1, MSH2, MSH3, MSH6, MUTYH, NBN, NF1, NF2, NTHL1, PALB2, PDGFRA, PHOX2B, PMS2, POLD1, POLE, POT1, PRKAR1A, PTCH1, PTEN, RAD50, RAD51C, RAD51D, RB1, RECQL4, RET, RNF43, RUNX1, SDHAF2, SDHA (sequence changes only), SDHB, SDHC, SDHD, SMAD4, SMARCA4, SMARCB1, SMARCE1, STK11, SUFU, TERC, TERT, TMEM127, TP53, TSC1, TSC2, VHL, WRN and WT1.    10/20/2020 - 10/20/2020 Chemotherapy    Patient is on Treatment Plan: BREAST  DOCETAXEL + CARBOPLATIN + TRASTUZUMAB + PERTUZUMAB  (TCHP) Q21D       10/20/2020 - 06/23/2021 Chemotherapy   Patient is on Treatment Plan : BREAST Trastuzumab q21d     11/10/2021 - 11/10/2021 Radiation Therapy   Site Technique Total Dose (Gy) Dose per Fx (Gy) Completed Fx Beam Energies  Brain: Brain PTV_1_Superior_63mm IMRT 18/18 18 1/1 6XFFF       CHIEF COMPLIANT: Metastatic breast cancer   INTERVAL HISTORY: Sheryl Mcmillan is a 47 y.o with the above mentioned estrogen receptor negative, Her2 positive breast cancer (s/p bilateral mastectomies); BRCA1+. She presents to the clinic for a follow-up.    ALLERGIES:  is allergic to carboplatin, other, and wound dressing adhesive.  MEDICATIONS:  Current Outpatient Medications  Medication Sig Dispense Refill   LORazepam (ATIVAN) 1 MG tablet Take one tablet by mouth  30 min prior to mri or radiation 4 tablet 0   No current facility-administered medications for this visit.    PHYSICAL EXAMINATION: ECOG PERFORMANCE STATUS: {CHL ONC ECOG PS:[phone]}  There were no vitals filed  for this visit. There were no vitals filed for this visit.  BREAST:*** No  palpable masses or nodules in either right or left breasts. No palpable axillary supraclavicular or infraclavicular adenopathy no breast tenderness or nipple discharge. (exam performed in the presence of a chaperone)  LABORATORY DATA:  I have reviewed the data as listed    Latest Ref Rng & Units 07/05/2022   11:58 AM 12/08/2021    5:06 AM 12/01/2021    6:21 AM  CMP  Glucose 70 - 99 mg/dL  161  096   BUN 6 - 20 mg/dL 10  16  11    Creatinine 0.44 - 1.00 mg/dL 0.45  4.09  8.11   Sodium 135 - 145 mmol/L  135  137   Potassium 3.5 - 5.1 mmol/L  4.6  3.6   Chloride 98 - 111 mmol/L  104  105   CO2 22 - 32 mmol/L  19  21   Calcium 8.9 - 10.3 mg/dL  8.4  8.9     Lab Results  Component Value Date   WBC 7.2 12/08/2021   HGB 13.3 12/08/2021   HCT 40.0 12/08/2021   MCV 85.7 12/08/2021   PLT 162 12/08/2021   NEUTROABS 13.2 (H) 11/20/2021    ASSESSMENT & PLAN:  No problem-specific Assessment & Plan notes found for this encounter.    No orders of the defined types were placed in this encounter.  The patient has a good understanding of the overall plan. she agrees with it. she will call with any problems that may develop before the next visit here. Total time spent: 30 mins including face to face time and time spent for planning, charting and co-ordination of care   Sherlyn Lick, CMA 03/04/23    I Janan Ridge am acting as a Neurosurgeon for [employer]  ***

## 2023-03-07 ENCOUNTER — Inpatient Hospital Stay: Payer: Medicaid Other | Attending: Hematology and Oncology | Admitting: Hematology and Oncology

## 2023-03-07 VITALS — BP 139/90 | HR 91 | Temp 97.5°F | Resp 18 | Ht 71.0 in | Wt 292.4 lb

## 2023-03-07 DIAGNOSIS — C7931 Secondary malignant neoplasm of brain: Secondary | ICD-10-CM | POA: Diagnosis present

## 2023-03-07 DIAGNOSIS — Z170 Estrogen receptor positive status [ER+]: Secondary | ICD-10-CM | POA: Insufficient documentation

## 2023-03-07 DIAGNOSIS — C50412 Malignant neoplasm of upper-outer quadrant of left female breast: Secondary | ICD-10-CM | POA: Diagnosis present

## 2023-03-07 NOTE — Assessment & Plan Note (Addendum)
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 CT CAP 05/18/22: mild non specific inc in para tracheal LN Nonspecific, stable 5 mm LUL nodule Bone scan: 05/18/22: L2 compression fracture, no bone mets CT chest done 08/22/2022: No evidence of metastatic disease, fatty liver Brain MRI 06/29/2022: New 2 mm metastasis right frontal lobe (SRS on 07/10/2022) 02/28/2023: CT CAP: No evidence of metastatic disease in CAP  CT scans in 4 months and follow-up after that to discuss results We discussed about weight loss and patient is going to make an appointment with her primary care physician to set up her annual follow-ups and discuss weight loss injections.

## 2023-03-18 ENCOUNTER — Ambulatory Visit: Payer: Medicaid Other | Attending: Neurosurgery | Admitting: Physical Therapy

## 2023-03-18 ENCOUNTER — Encounter: Payer: Self-pay | Admitting: Physical Therapy

## 2023-03-18 DIAGNOSIS — M6281 Muscle weakness (generalized): Secondary | ICD-10-CM | POA: Diagnosis present

## 2023-03-18 DIAGNOSIS — R262 Difficulty in walking, not elsewhere classified: Secondary | ICD-10-CM | POA: Diagnosis present

## 2023-03-18 DIAGNOSIS — R2681 Unsteadiness on feet: Secondary | ICD-10-CM | POA: Diagnosis present

## 2023-03-18 NOTE — Therapy (Signed)
OUTPATIENT PHYSICAL THERAPY NEURO EVALUATION   Patient Name: Sheryl Mcmillan MRN: [id number] DOB:1976/02/12, 47 y.o., female Today's Date: 03/18/2023   PCP: [name], PA-C REFERRING PROVIDER: [name], MD   PT End of Session - 03/18/23 [phone]     Visit Number 1    Number of Visits 15    Date for PT Re-Evaluation 06/25/23    Authorization Type [employer]    PT Start Time 0930    PT Stop Time 1015    PT Time Calculation (min) 45 min    Activity Tolerance Patient tolerated treatment well    Behavior During Therapy [facility] for tasks assessed/performed              Past Medical History:  Diagnosis Date   Arthritis    Breast cancer (HCC)    Family history of non-Hodgkin's lymphoma 06/01/2020   GERD (gastroesophageal reflux disease)    Hemorrhoids    Migraines    PCOS (polycystic ovarian syndrome)    PONV (postoperative nausea and vomiting)    Past Surgical History:  Procedure Laterality Date   APPLICATION OF CRANIAL NAVIGATION Left 11/10/2021   Procedure: APPLICATION OF CRANIAL NAVIGATION;  Surgeon: [name], MD;  [facility];  Service: Neurosurgery;  Laterality: Left;   AXILLARY SENTINEL NODE BIOPSY Left 11/07/2020   Procedure: LEFT AXILLARY SENTINEL NODE BIOPSY;  Surgeon: [name], MD;  Location: [facility] OR;  Service: General;  Laterality: Left;   BREAST CYST EXCISION Right 09/22/2021   Procedure: Excision of right axillary excess soft tissue.;  Surgeon: [name], MD;  Location: [facility];  Service: Plastics;  Laterality: Right;   BREAST RECONSTRUCTION WITH PLACEMENT OF TISSUE EXPANDER AND FLEX HD (ACELLULAR HYDRATED DERMIS) Bilateral 11/07/2020   Procedure: BILATERAL BREAST RECONSTRUCTION WITH PLACEMENT OF TISSUE EXPANDER AND FLEX HD (ACELLULAR HYDRATED DERMIS);  Surgeon: [name], MD;  Location: [facility] OR;  Service: Plastics;  Laterality: Bilateral;   COSMETIC SURGERY     CRANIOTOMY Left 11/10/2021   Procedure: LT FRONTAL  CRANIOTOMY TUMOR EXCISION;  Surgeon: [name], MD;  [facility];  Service: Neurosurgery;  Laterality: Left;   DILATION AND CURETTAGE OF UTERUS     HEMORRHOID SURGERY     IR IMAGING GUIDED PORT INSERTION  06/15/2020   LIPOSUCTION WITH LIPOFILLING Bilateral 09/22/2021   Procedure: Fat grafting bilateral breast;  Surgeon: [name], MD;  Location: [facility];  Service: Plastics;  Laterality: Bilateral;   MASS EXCISION Bilateral 04/25/2021   Procedure: excision of bilateral axillary breast tissue;  Surgeon: [name], MD;  Location: [facility];  Service: Plastics;  Laterality: Bilateral;   PORT-A-CATH REMOVAL Right 11/07/2020   Procedure: REMOVAL PORT-A-CATH;  Surgeon: [name], MD;  Location: [facility] OR;  Service: General;  Laterality: Right;   REMOVAL OF BILATERAL TISSUE EXPANDERS WITH PLACEMENT OF BILATERAL BREAST IMPLANTS Bilateral 04/25/2021   Procedure: REMOVAL OF BILATERAL TISSUE EXPANDERS WITH PLACEMENT OF BILATERAL BREAST IMPLANTS;  Surgeon: [name], MD;  Location: [facility];  Service: Plastics;  Laterality: Bilateral;   ROBOTIC ASSISTED TOTAL HYSTERECTOMY WITH BILATERAL SALPINGO OOPHERECTOMY Bilateral 03/14/2021   Procedure: XI ROBOTIC ASSISTED TOTAL HYSTERECTOMY WITH BILATERAL SALPINGO OOPHORECTOMY;  Surgeon: [name], MD;  Location: [facility];  Service: Gynecology;  Laterality: Bilateral;   TOTAL MASTECTOMY Bilateral 11/07/2020   Procedure: BILATERAL MASTECTOMY;  Surgeon: [name], MD;  Location: [facility] OR;  Service: General;  Laterality: Bilateral;   Patient Active Problem List  Diagnosis Date Noted   Brain tumor (HCC) 11/08/2021   Metastatic cancer to brain (HCC) 11/08/2021   Cerebral edema (HCC) 11/08/2021   Complex partial seizure (HCC) 11/08/2021   Brain mass 11/07/2021   Right sided weakness 11/07/2021   IDA (iron deficiency anemia) 07/18/2021   Cholelithiasis 03/14/2021   Peripheral  neuropathy due to chemotherapy (HCC) 10/20/2020   Morbid obesity with BMI of 40.0-44.9, adult (HCC) 07/19/2020   BRCA1 gene mutation positive 07/04/2020   Genetic testing 06/28/2020   Family history of non-Hodgkin's lymphoma 06/01/2020   Malignant neoplasm of upper-outer quadrant of left breast in female, estrogen receptor positive (HCC) 05/26/2020    ONSET DATE: 12/05/2021 (MD referral)  REFERRING DIAG: D49.6 (ICD-10-CM) - Neoplasm of unspecified behavior of brain  THERAPY DIAG:  Muscle weakness (generalized)  Unsteadiness on feet  Difficulty in walking, not elsewhere classified  Rationale for Evaluation and Treatment Rehabilitation  SUBJECTIVE:                                                                                                                                                                                              SUBJECTIVE STATEMENT: Nov 10, 2021 she had brain surgery for brain mets, woke up and her right side would not work.  She had rehab and overall did well and got back to a limited but functional lifestyle, she recently found a new spot in January in her brain, she had radiation  and they feel like they got it.  She does report wanting to be back as good as possible prior to the 1 year after surgery date as she has been told that is where she will be and wants to be strong and walk better, unable to drive  , still reports unsteady gait and loss of balance, she is trying to be as active as possible Pt accompanied by: self  PERTINENT HISTORY: metastatic breast cancer, brain tumor s/p excision with craniotomy; GERD, morbid obesity.  Admitted to rehab ([facility]) 11/17/21 and d/c home 12/09/2021  PAIN:  Are you having pain? No  PRECAUTIONS: Fall and Other: no driving  FALLS: Has patient fallen in last 6 months? 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 Now s/p L frontal crani tumor excision 5/19.  COGNITION: Overall cognitive status: Within functional limits for tasks assessed   SENSATION:  Light touch: WFL  COORDINATION: Decreased coordination RLE-needs assist of LLE or UEs to place RLE   EDEMA:  none  MUSCLE TONE: RLE: Hypotonic   POSTURE: rounded shoulders  LOWER EXTREMITY ROM:     Active Right Eval Left Eval  Hip flexion    Hip extension    Hip abduction    Hip adduction    Hip internal rotation    Hip external rotation    Knee flexion    Knee extension    Ankle dorsiflexion 3   Ankle plantarflexion 30   Ankle inversion 10   Ankle eversion 0    (Blank rows = not tested)  LOWER EXTREMITY MMT:    MMT Right Eval Left Eval  Hip flexion 2+/5 4/+5  Hip extension    Hip abduction 3-/5 4/+5  Hip adduction 3+/5 4/5  Hip internal rotation    Hip external rotation    Knee flexion 3-+5 4+/5  Knee extension 3-+5 4+/5  Ankle dorsiflexion 2+ 5/5  Ankle plantarflexion 2   Ankle inversion 2+   Ankle eversion 0   (Blank rows = not tested)   TRANSFERS: Assistive device utilized: no device  Sit to stand: Independent Stand to sit: Independent  GAIT: Gait pattern: step through pattern, decreased step length- Left, decreased stance time- Right, decreased ankle dorsiflexion- Right, and genu recurvatum- Right decreased control of the right LE especially the foot Distance walked: 60 ft  Assistive device utilized:no device Level  of assistance: supervision Going down staris is one at a time, has to use handrail  FUNCTIONAL TESTs:  TUG 14 seconds no device 5XSTS = 18 seconds Berg 50/56 SLS right <2 seconds, left 6 seconds DGI 10   PATIENT EDUCATION: Education details: PT POC, Eval results, Person educated: Patient Education method: education Education comprehension: verbalized understanding and returned demonstration   HOME EXERCISE PROGRAM:    GOALS: Goals reviewed with patient? Yes  SHORT TERM GOALS: Target date: 04/09/23  Pt will be independent with HEP for improved strength, balance, gait. Baseline:  Initial HEP from CIR Goal status: INITIAL  LONG TERM GOALS: Target date: 06/18/23  Pt will be independent with HEP for improved strength, balance, transfers, and gait. Goal status: INITIAL  2.  Pt will improve 5x sit<>stand to less than or equal to 15 sec to demonstrate improved functional strength and transfer efficiency. Goal status: INITIAL  3.  Pt will improve TUG score to less than or equal to 12 sec for decreased fall risk. Goal status: INITIAL  4.  Increase Berg balance score to 53/56 Baseline: 50/56 Goal status: INITIAL  5.  Stand on the left leg 10 seconds Goal status: INITIAL  ASSESSMENT:  CLINICAL IMPRESSION: Patient is a 47 y.o. female who was seen today for physical therapy evaluation and treatment for metastatic brain cancer, s/p excision with craniotomy on 11/10/2021.  She participated in inpatient rehab and was d/c home 12/09/2021.  Pt has R hemiplegia, with decreased muscle strength, decreased flexibility, decreased balance, decreased independence with gait.  She demonstrates increased fall risk per 5 time sit<>stand test, TUG, scores.  Prior to hospitalization and surgery, she was independent and working.  She had  PT here last fall and in the spring, progressed very well, she was walking with a walker and progressed to a cane.  She has made great progress but still has issues  with the right LE but some improvements since we saw her last. She recently had radiation for new met sites.  She would benefit from skilled PT to address the stated deficits to decrease fall risk and improve functional mobility and independence.  OBJECTIVE IMPAIRMENTS Abnormal gait, decreased balance, decreased mobility, difficulty walking, decreased ROM, decreased strength, impaired flexibility, and impaired tone.   ACTIVITY LIMITATIONS standing, transfers, locomotion level, and caring for others  PARTICIPATION LIMITATIONS: meal prep, driving, shopping, community activity, and occupation  PERSONAL FACTORS 3+ comorbidities: see above for PMH  are also affecting patient's functional outcome.   REHAB POTENTIAL: Good  CLINICAL DECISION MAKING: Evolving/moderate complexity  EVALUATION COMPLEXITY: Moderate  PLAN: PT FREQUENCY: 2x/week  PT DURATION: 12 weeks plus eval  PLANNED INTERVENTIONS: Therapeutic exercises, Therapeutic activity, Neuromuscular re-education, Balance training, Gait training, Patient/Family education, Joint mobilization, Orthotic/Fit training, DME instructions, Aquatic Therapy, and Manual therapy  PLAN FOR NEXT SESSION: really push her function and the right LE use  Kiasha Bellin W, PT 03/18/2023, 10:03 AM

## 2023-04-01 ENCOUNTER — Ambulatory Visit: Payer: Medicaid Other | Attending: Neurosurgery | Admitting: Physical Therapy

## 2023-04-01 ENCOUNTER — Ambulatory Visit: Payer: Medicaid Other | Admitting: Family

## 2023-04-01 ENCOUNTER — Encounter: Payer: Self-pay | Admitting: Physical Therapy

## 2023-04-01 VITALS — BP 119/87 | HR 96 | Temp 97.6°F | Resp 16 | Wt 295.0 lb

## 2023-04-01 DIAGNOSIS — Z13228 Encounter for screening for other metabolic disorders: Secondary | ICD-10-CM

## 2023-04-01 DIAGNOSIS — Z131 Encounter for screening for diabetes mellitus: Secondary | ICD-10-CM

## 2023-04-01 DIAGNOSIS — Z13220 Encounter for screening for lipoid disorders: Secondary | ICD-10-CM

## 2023-04-01 DIAGNOSIS — Z130 Encounter for screening for diseases of the blood and blood-forming organs and certain disorders involving the immune mechanism: Secondary | ICD-10-CM

## 2023-04-01 DIAGNOSIS — Z114 Encounter for screening for human immunodeficiency virus [HIV]: Secondary | ICD-10-CM

## 2023-04-01 DIAGNOSIS — K219 Gastro-esophageal reflux disease without esophagitis: Secondary | ICD-10-CM

## 2023-04-01 DIAGNOSIS — M62838 Other muscle spasm: Secondary | ICD-10-CM | POA: Diagnosis not present

## 2023-04-01 DIAGNOSIS — R2681 Unsteadiness on feet: Secondary | ICD-10-CM | POA: Diagnosis present

## 2023-04-01 DIAGNOSIS — R262 Difficulty in walking, not elsewhere classified: Secondary | ICD-10-CM | POA: Diagnosis present

## 2023-04-01 DIAGNOSIS — R2689 Other abnormalities of gait and mobility: Secondary | ICD-10-CM | POA: Diagnosis present

## 2023-04-01 DIAGNOSIS — Z0000 Encounter for general adult medical examination without abnormal findings: Secondary | ICD-10-CM

## 2023-04-01 DIAGNOSIS — R278 Other lack of coordination: Secondary | ICD-10-CM | POA: Insufficient documentation

## 2023-04-01 DIAGNOSIS — M6281 Muscle weakness (generalized): Secondary | ICD-10-CM | POA: Insufficient documentation

## 2023-04-01 DIAGNOSIS — Z1159 Encounter for screening for other viral diseases: Secondary | ICD-10-CM

## 2023-04-01 DIAGNOSIS — Z1329 Encounter for screening for other suspected endocrine disorder: Secondary | ICD-10-CM

## 2023-04-01 MED ORDER — OMEPRAZOLE 40 MG PO CPDR: 40.0000 mg | DELAYED_RELEASE_CAPSULE | Freq: Every day | ORAL | 0 refills | Status: DC

## 2023-04-01 MED ORDER — CYCLOBENZAPRINE HCL 5 MG PO TABS: 5.0000 mg | ORAL_TABLET | Freq: Every evening | ORAL | 1 refills | Status: DC | PRN

## 2023-04-01 NOTE — Progress Notes (Signed)
Patient is here for their 6 month follow-up Patient has no concerns today Care gaps have been discussed with patient  

## 2023-04-01 NOTE — Progress Notes (Signed)
Patient ID: Sheryl Mcmillan, female    DOB: 03-Feb-1976  MRN: [id number]  CC: Annual Physical Exam  Subjective: Sheryl Mcmillan is a 47 y.o. female who presents for annual physical exam.   Her concerns today include:  - Needs refills of Omeprazole. Doing well on regimen no issues/concerns.  - Right lower extremity muscle spasms especially during nighttime. Reports she is taking magnesium oil to help. She would like to trial a muscle relaxer.  - Reports she is no longer gets mammograms due to history of bilateral mastectomy.  - Reports she no longer get pap smears due to history of total hysterectomy.  - States she has had 11 colonoscopies at [facility].  - States she will return at later date for fasting labs.   Patient Active Problem List   Diagnosis Date Noted   Brain tumor (HCC) 11/08/2021   Metastatic cancer to brain (HCC) 11/08/2021   Cerebral edema (HCC) 11/08/2021   Complex partial seizure (HCC) 11/08/2021   Brain mass 11/07/2021   Right sided weakness 11/07/2021   IDA (iron deficiency anemia) 07/18/2021   Cholelithiasis 03/14/2021   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   Morbid obesity with BMI of 40.0-44.9, adult (HCC) 07/19/2020   BRCA1 gene mutation positive 07/04/2020   Genetic testing 06/28/2020   Family history of non-Hodgkin's lymphoma 06/01/2020   Malignant neoplasm of upper-outer quadrant of left breast in female, estrogen receptor positive (HCC) 05/26/2020   Acute right ankle pain 05/16/2016   Nondisplaced fracture of lateral malleolus of right fibula, initial encounter for closed fracture 05/16/2016     Current Outpatient Medications on File Prior to Visit  Medication Sig Dispense Refill   LORazepam (ATIVAN) 1 MG tablet Take one tablet by mouth  30 min prior to mri or radiation 4 tablet 0   No current facility-administered medications on file prior to visit.    Allergies  Allergen Reactions   Carboplatin Hives and  Itching    Carboplatin reaction after dose #5 requiring Benadryl, Solumedrol, epinephrine, Claritin.   Other Hives    Baking Soda   Wound Dressing Adhesive Hives    Rips skin, prefers paper tape    Social History   Socioeconomic History   Marital status: Single    Spouse name: Not on file   Number of children: 1   Years of education: Not on file   Highest education level: Some college, no degree  Occupational History   Not on file  Tobacco Use   Smoking status: Former    Current packs/day: 0.00    Average packs/day: 0.5 packs/day for 10.0 years (5.0 ttl pk-yrs)    Types: Cigarettes    Start date: 08/24/2010    Quit date: 08/23/2020    Years since quitting: 2.6    Passive exposure: Never   Smokeless tobacco: Never  Vaping Use   Vaping status: Never Used  Substance and Sexual Activity   Alcohol use: Not Currently    Comment: rare   Drug use: Yes    Types: Marijuana    Comment: Last use 11/07/21   Sexual activity: Yes    Birth control/protection: Surgical    Comment: Hysterectomy  Other Topics Concern   Not on file  Social History Narrative   Not on file   Social Determinants of Health   Financial Resource Strain: Not on file  Food Insecurity: No Food Insecurity (10/15/2022)   Hunger Vital Sign    Worried About Running Out of  Food in the Last Year: Never true    Ran Out of Food in the Last Year: Never true  Transportation Needs: No Transportation Needs (05/05/2020)   PRAPARE - Administrator, Civil Service (Medical): No    Lack of Transportation (Non-Medical): No  Physical Activity: Not on file  Stress: Not on file  Social Connections: Not on file  Intimate Partner Violence: Not At Risk (10/15/2022)   Humiliation, Afraid, Rape, and Kick questionnaire    Fear of Current or Ex-Partner: No    Emotionally Abused: No    Physically Abused: No    Sexually Abused: No    Family History  Problem Relation Age of Onset   Hypertension Mother    Diabetes Mother     Cancer Mother 16       unknown primary   Other Mother        brain tumor; dx 46s   Non-Hodgkin's lymphoma Brother 84   Other Maternal Uncle 42       brain tumor   Breast cancer Other        MGM's niece; dx late 67s   Pancreatic cancer Neg Hx    Colon cancer Neg Hx    Endometrial cancer Neg Hx    Prostate cancer Neg Hx    Ovarian cancer Neg Hx     Past Surgical History:  Procedure Laterality Date   APPLICATION OF CRANIAL NAVIGATION Left 11/10/2021   Procedure: APPLICATION OF CRANIAL NAVIGATION;  Surgeon: [name], MD;  Location: [facility] OR;  Service: Neurosurgery;  Laterality: Left;   AXILLARY SENTINEL NODE BIOPSY Left 11/07/2020   Procedure: LEFT AXILLARY SENTINEL NODE BIOPSY;  Surgeon: [name], MD;  Location: [facility] OR;  Service: General;  Laterality: Left;   BREAST CYST EXCISION Right 09/22/2021   Procedure: Excision of right axillary excess soft tissue.;  Surgeon: [name], MD;  Location: [facility];  Service: Plastics;  Laterality: Right;   BREAST RECONSTRUCTION WITH PLACEMENT OF TISSUE EXPANDER AND FLEX HD (ACELLULAR HYDRATED DERMIS) Bilateral 11/07/2020   Procedure: BILATERAL BREAST RECONSTRUCTION WITH PLACEMENT OF TISSUE EXPANDER AND FLEX HD (ACELLULAR HYDRATED DERMIS);  Surgeon: [name], MD;  Location: [facility] OR;  Service: Plastics;  Laterality: Bilateral;   COSMETIC SURGERY     CRANIOTOMY Left 11/10/2021   Procedure: LT FRONTAL CRANIOTOMY TUMOR EXCISION;  Surgeon: [name], MD;  Location: [facility];  Service: Neurosurgery;  Laterality: Left;   DILATION AND CURETTAGE OF UTERUS     HEMORRHOID SURGERY     IR IMAGING GUIDED PORT INSERTION  06/15/2020   LIPOSUCTION WITH LIPOFILLING Bilateral 09/22/2021   Procedure: Fat grafting bilateral breast;  Surgeon: [name], MD;  Location: [facility];  Service: Plastics;  Laterality: Bilateral;   MASS EXCISION Bilateral 04/25/2021   Procedure: excision of bilateral axillary  breast tissue;  Surgeon: [name], MD;  Location: [facility];  Service: Plastics;  Laterality: Bilateral;   PORT-A-CATH REMOVAL Right 11/07/2020   Procedure: REMOVAL PORT-A-CATH;  Surgeon: [name], MD;  Location: [facility] OR;  Service: General;  Laterality: Right;   REMOVAL OF BILATERAL TISSUE EXPANDERS WITH PLACEMENT OF BILATERAL BREAST IMPLANTS Bilateral 04/25/2021   Procedure: REMOVAL OF BILATERAL TISSUE EXPANDERS WITH PLACEMENT OF BILATERAL BREAST IMPLANTS;  Surgeon: [name], MD;  Location: [facility];  Service: Plastics;  Laterality: Bilateral;   ROBOTIC ASSISTED TOTAL HYSTERECTOMY WITH BILATERAL SALPINGO OOPHERECTOMY Bilateral 03/14/2021   Procedure: XI ROBOTIC  ASSISTED TOTAL HYSTERECTOMY WITH BILATERAL SALPINGO OOPHORECTOMY;  Surgeon: [name], MD;  Location: [facility];  Service: Gynecology;  Laterality: Bilateral;   TOTAL MASTECTOMY Bilateral 11/07/2020   Procedure: BILATERAL MASTECTOMY;  Surgeon: [name], MD;  Location: [facility];  Service: General;  Laterality: Bilateral;    ROS: Review of Systems Negative except as stated above  PHYSICAL EXAM: BP 119/87   Pulse 96   Temp 97.6 F (36.4 C) (Oral)   Resp 16   Wt 295 lb (133.8 kg)   LMP 06/08/2020   SpO2 96%   BMI 41.14 kg/m   Physical Exam HENT:     Head: Normocephalic and atraumatic.     Right Ear: Tympanic membrane, ear canal and external ear normal.     Left Ear: Tympanic membrane, ear canal and external ear normal.     Nose: Nose normal.     Mouth/Throat:     Mouth: Mucous membranes are moist.     Pharynx: Oropharynx is clear.  Eyes:     Extraocular Movements: Extraocular movements intact.     Conjunctiva/sclera: Conjunctivae normal.     Pupils: Pupils are equal, round, and reactive to light.  Neck:     Thyroid: No thyroid mass, thyromegaly or thyroid tenderness.  Cardiovascular:     Rate and Rhythm: Normal rate and regular rhythm.     Pulses: Normal pulses.      Heart sounds: Normal heart sounds.  Pulmonary:     Effort: Pulmonary effort is normal.     Breath sounds: Normal breath sounds.  Chest:     Comments: Patient declined.  Abdominal:     General: Bowel sounds are normal.     Palpations: Abdomen is soft.  Genitourinary:    Comments: Patient declined.  Musculoskeletal:        General: Normal range of motion.     Right shoulder: Normal.     Left shoulder: Normal.     Right upper arm: Normal.     Left upper arm: Normal.     Right elbow: Normal.     Left elbow: Normal.     Right forearm: Normal.     Left forearm: Normal.     Right wrist: Normal.     Left wrist: Normal.     Right hand: Normal.     Left hand: Normal.     Cervical back: Normal, normal range of motion and neck supple.     Thoracic back: Normal.     Lumbar back: Normal.     Right hip: Normal.     Left hip: Normal.     Right upper leg: Normal.     Left upper leg: Normal.     Right knee: Normal.     Left knee: Normal.     Right lower leg: Normal.     Left lower leg: Normal.     Right ankle: Normal.     Left ankle: Normal.     Right foot: Normal.     Left foot: Normal.  Skin:    General: Skin is warm and dry.     Capillary Refill: Capillary refill takes less than 2 seconds.  Neurological:     General: No focal deficit present.     Mental Status: She is alert and oriented to person, place, and time.  Psychiatric:        Mood and Affect: Mood normal.        Behavior: Behavior normal.      ASSESSMENT  AND PLAN: 1. Annual physical exam - Counseled on 150 minutes of exercise per week as tolerated, healthy eating (including decreased daily intake of saturated fats, cholesterol, added sugars, sodium), STI prevention, and routine healthcare maintenance.  2. Screening for metabolic disorder - Routine screening.  - CMP14+EGFR  3. Screening for deficiency anemia - Routine screening.  - CBC  4. Diabetes mellitus screening - Routine screening.  - Hemoglobin  A1c  5. Screening cholesterol level - Routine screening.  - Lipid panel  6. Thyroid disorder screen - Routine screening.  - TSH  7. Encounter for screening for HIV - Routine screening.  - HIV antibody (with reflex)  8. Need for hepatitis C screening test - Routine screening.  - Hepatitis C Antibody  9. Gastroesophageal reflux disease, unspecified whether esophagitis present - Continue Omeprazole as prescribed. Counseled on  medication adherence/adverse effects.  - Follow-up with primary provider in 3 months or sooner if needed.  - omeprazole (PRILOSEC) 40 MG capsule; Take 1 capsule (40 mg total) by mouth daily.  Dispense: 90 capsule; Refill: 0  10. Muscle spasm - Cyclobenzaprine as prescribed. Counseled on medication adherence/adverse effects.  - Routine screening.  - Follow-up with primary provider in 4 weeks or sooner if needed.  - cyclobenzaprine (FLEXERIL) 5 MG tablet; Take 1 tablet (5 mg total) by mouth at bedtime as needed for muscle spasms.  Dispense: 30 tablet; Refill: 1 - Vitamin D, 25-hydroxy   Patient was given the opportunity to ask questions.  Patient verbalized understanding of the plan and was able to repeat key elements of the plan. Patient was given clear instructions to go to Emergency Department or return to medical center if symptoms don't improve, worsen, or new problems develop.The patient verbalized understanding.   Orders Placed This Encounter  Procedures   CBC   CMP14+EGFR   Hemoglobin A1c   Hepatitis C Antibody   HIV antibody (with reflex)   Lipid panel   TSH   Vitamin D, 25-hydroxy     Requested Prescriptions   Signed Prescriptions Disp Refills   cyclobenzaprine (FLEXERIL) 5 MG tablet 30 tablet 1    Sig: Take 1 tablet (5 mg total) by mouth at bedtime as needed for muscle spasms.   omeprazole (PRILOSEC) 40 MG capsule 90 capsule 0    Sig: Take 1 capsule (40 mg total) by mouth daily.    Return in about 1 year (around 03/31/2024) for  Physical per patient preference.  Rema Fendt, NP

## 2023-04-01 NOTE — Therapy (Signed)
OUTPATIENT PHYSICAL THERAPY NEURO EVALUATION   Patient Name: Sheryl Mcmillan MRN: [id number] DOB:01-21-1976, 47 y.o., female Today's Date: 04/01/2023   PCP: [name], PA-C REFERRING PROVIDER: [name], MD   PT End of Session - 04/01/23 1604     Visit Number 2    Date for PT Re-Evaluation 06/25/23    PT Start Time 1600    PT Stop Time 1645    PT Time Calculation (min) 45 min    Activity Tolerance Patient tolerated treatment well    Behavior During Therapy [facility] for tasks assessed/performed              Past Medical History:  Diagnosis Date   Arthritis    Breast cancer (HCC)    Family history of non-Hodgkin's lymphoma 06/01/2020   GERD (gastroesophageal reflux disease)    Hemorrhoids    Migraines    PCOS (polycystic ovarian syndrome)    PONV (postoperative nausea and vomiting)    Past Surgical History:  Procedure Laterality Date   APPLICATION OF CRANIAL NAVIGATION Left 11/10/2021   Procedure: APPLICATION OF CRANIAL NAVIGATION;  Surgeon: [name], MD;  Location: [facility] OR;  Service: Neurosurgery;  Laterality: Left;   AXILLARY SENTINEL NODE BIOPSY Left 11/07/2020   Procedure: LEFT AXILLARY SENTINEL NODE BIOPSY;  Surgeon: [name], MD;  Location: [facility] OR;  Service: General;  Laterality: Left;   BREAST CYST EXCISION Right 09/22/2021   Procedure: Excision of right axillary excess soft tissue.;  Surgeon: [name], MD;  Location: [facility];  Service: Plastics;  Laterality: Right;   BREAST RECONSTRUCTION WITH PLACEMENT OF TISSUE EXPANDER AND FLEX HD (ACELLULAR HYDRATED DERMIS) Bilateral 11/07/2020   Procedure: BILATERAL BREAST RECONSTRUCTION WITH PLACEMENT OF TISSUE EXPANDER AND FLEX HD (ACELLULAR HYDRATED DERMIS);  Surgeon: [name], MD;  Location: [facility] OR;  Service: Plastics;  Laterality: Bilateral;   COSMETIC SURGERY     CRANIOTOMY Left 11/10/2021   Procedure: LT FRONTAL CRANIOTOMY TUMOR EXCISION;  Surgeon: [name], MD;   Location: [facility] OR;  Service: Neurosurgery;  Laterality: Left;   DILATION AND CURETTAGE OF UTERUS     HEMORRHOID SURGERY     IR IMAGING GUIDED PORT INSERTION  06/15/2020   LIPOSUCTION WITH LIPOFILLING Bilateral 09/22/2021   Procedure: Fat grafting bilateral breast;  Surgeon: [name], MD;  Location: [facility];  Service: Plastics;  Laterality: Bilateral;   MASS EXCISION Bilateral 04/25/2021   Procedure: excision of bilateral axillary breast tissue;  Surgeon: [name], MD;  Location: [facility];  Service: Plastics;  Laterality: Bilateral;   PORT-A-CATH REMOVAL Right 11/07/2020   Procedure: REMOVAL PORT-A-CATH;  Surgeon: [name], MD;  Location: [facility] OR;  Service: General;  Laterality: Right;   REMOVAL OF BILATERAL TISSUE EXPANDERS WITH PLACEMENT OF BILATERAL BREAST IMPLANTS Bilateral 04/25/2021   Procedure: REMOVAL OF BILATERAL TISSUE EXPANDERS WITH PLACEMENT OF BILATERAL BREAST IMPLANTS;  Surgeon: [name], MD;  Location: [facility];  Service: Plastics;  Laterality: Bilateral;   ROBOTIC ASSISTED TOTAL HYSTERECTOMY WITH BILATERAL SALPINGO OOPHERECTOMY Bilateral 03/14/2021   Procedure: XI ROBOTIC ASSISTED TOTAL HYSTERECTOMY WITH BILATERAL SALPINGO OOPHORECTOMY;  Surgeon: [name], MD;  Location: [facility];  Service: Gynecology;  Laterality: Bilateral;   TOTAL MASTECTOMY Bilateral 11/07/2020   Procedure: BILATERAL MASTECTOMY;  Surgeon: [name], MD;  Location: [facility] OR;  Service: General;  Laterality: Bilateral;   Patient Active Problem List   Diagnosis Date Noted   Brain tumor (HCC) 11/08/2021  Metastatic cancer to brain (HCC) 11/08/2021   Cerebral edema (HCC) 11/08/2021   Complex partial seizure (HCC) 11/08/2021   Brain mass 11/07/2021   Right sided weakness 11/07/2021   IDA (iron deficiency anemia) 07/18/2021   Cholelithiasis 03/14/2021   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   Morbid obesity  with BMI of 40.0-44.9, adult (HCC) 07/19/2020   BRCA1 gene mutation positive 07/04/2020   Genetic testing 06/28/2020   Family history of non-Hodgkin's lymphoma 06/01/2020   Malignant neoplasm of upper-outer quadrant of left breast in female, estrogen receptor positive (HCC) 05/26/2020   Acute right ankle pain 05/16/2016   Nondisplaced fracture of lateral malleolus of right fibula, initial encounter for closed fracture 05/16/2016    ONSET DATE: 12/05/2021 (MD referral)  REFERRING DIAG: D49.6 (ICD-10-CM) - Neoplasm of unspecified behavior of brain  THERAPY DIAG:  Muscle weakness (generalized)  Unsteadiness on feet  Difficulty in walking, not elsewhere classified  Rationale for Evaluation and Treatment Rehabilitation  SUBJECTIVE:                                                                                                                                                                                              SUBJECTIVE STATEMENT: "I feel good"  Pt accompanied by: self  PERTINENT HISTORY: metastatic breast cancer, brain tumor s/p excision with craniotomy; GERD, morbid obesity.  Admitted to rehab ([facility]) 11/17/21 and d/c home 12/09/2021  PAIN:  Are you having pain? No  PRECAUTIONS: Fall and Other: no driving  FALLS: Has patient fallen in last 6 months? 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 Now s/p L frontal crani tumor excision 5/19.  COGNITION: Overall cognitive status: Within functional limits for tasks assessed   SENSATION:  Light touch: WFL  COORDINATION: Decreased coordination RLE-needs assist of LLE or UEs to place RLE   EDEMA:  none  MUSCLE TONE: RLE: Hypotonic   POSTURE: rounded shoulders  LOWER EXTREMITY ROM:     Active Right Eval Left Eval  Hip flexion  Hip extension    Hip abduction    Hip adduction    Hip internal rotation    Hip external rotation    Knee flexion    Knee extension    Ankle dorsiflexion 3   Ankle plantarflexion 30   Ankle inversion 10   Ankle eversion 0    (Blank rows = not tested)  LOWER EXTREMITY MMT:    MMT Right Eval Left Eval  Hip flexion 2+/5 4/+5  Hip extension    Hip abduction 3-/5 4/+5  Hip adduction 3+/5 4/5  Hip internal rotation    Hip external rotation    Knee flexion 3-+5 4+/5  Knee extension 3-+5 4+/5  Ankle dorsiflexion 2+ 5/5  Ankle plantarflexion 2   Ankle inversion 2+   Ankle eversion 0   (Blank rows = not tested)   TRANSFERS: Assistive device utilized: no device  Sit to stand: Independent Stand to sit: Independent  GAIT: Gait pattern: step through pattern, decreased step length- Left, decreased stance time- Right, decreased ankle dorsiflexion- Right, and genu recurvatum- Right decreased control of the right LE especially the foot Distance walked: 60 ft  Assistive device utilized:no device Level of assistance: supervision Going down staris is one at a time, has to use handrail  FUNCTIONAL TESTs:  TUG 14 seconds no device 5XSTS = 18 seconds Berg 50/56 SLS right <2 seconds, left 6 seconds DGI 10   PATIENT EDUCATION: Education details: PT POC, Eval results, Person educated: Patient Education method: education Education comprehension: verbalized understanding and returned demonstration  Today Treatment 04/01/23 NuStep L 5 x 6 min Alt 6in box taps  2x10 S2S holding yellow ball 2x10 Side step & Tamdem walking on airex in // bars  HS curls 35lb 2x10, RLE 20lb x10 Leg Ext 15lb 2x10   HOME EXERCISE PROGRAM:    GOALS: Goals reviewed with patient? Yes  SHORT TERM GOALS: Target date: 04/09/23  Pt will be independent with HEP for improved strength, balance, gait. Baseline:  Initial HEP from CIR Goal status: INITIAL  LONG TERM GOALS: Target date: 06/18/23  Pt will be independent with HEP for improved strength, balance, transfers, and gait. Goal status: INITIAL  2.  Pt will improve 5x sit<>stand to less than or equal to 15 sec to demonstrate improved functional strength and transfer efficiency. Goal status: INITIAL  3.  Pt will improve TUG score to less than or equal to 12 sec for decreased fall risk. Goal status: INITIAL  4.  Increase Berg balance score to 53/56 Baseline: 50/56 Goal status: INITIAL  5.  Stand on the left leg 10 seconds Goal status: INITIAL  ASSESSMENT:  CLINICAL IMPRESSION: Patient is a 47 y.o. female who was seen today for physical therapy treatment for metastatic brain cancer, s/p excision with craniotomy on 11/10/2021. Pt has R hemiplegia, with decreased muscle strength, decreased flexibility, decreased balance, decreased independence with gait.  Pt tolerated an initial progression to TE well evident by no subjective reports fo increase pain. All interventions completed well. Some instability with side steps and tandem walking on balance beam.  She would benefit from skilled PT to address the stated deficits to decrease fall risk and improve functional mobility and independence.  OBJECTIVE IMPAIRMENTS Abnormal gait, decreased balance, decreased mobility, difficulty walking, decreased ROM, decreased strength, impaired flexibility, and impaired tone.   ACTIVITY LIMITATIONS standing, transfers, locomotion level, and caring for others  PARTICIPATION LIMITATIONS: meal prep, driving, shopping, community  activity, and occupation  PERSONAL FACTORS 3+ comorbidities: see above  for PMH  are also affecting patient's functional outcome.   REHAB POTENTIAL: Good  CLINICAL DECISION MAKING: Evolving/moderate complexity  EVALUATION COMPLEXITY: Moderate  PLAN: PT FREQUENCY: 2x/week  PT DURATION: 12 weeks plus eval  PLANNED INTERVENTIONS: Therapeutic exercises, Therapeutic activity, Neuromuscular re-education, Balance training, Gait training, Patient/Family education, Joint mobilization, Orthotic/Fit training, DME instructions, Aquatic Therapy, and Manual therapy  PLAN FOR NEXT SESSION: really push her function and the right LE use  Grayce Sessions, PTA 04/01/2023, 4:04 PM

## 2023-04-07 ENCOUNTER — Telehealth: Payer: Medicaid Other | Admitting: Nurse Practitioner

## 2023-04-07 DIAGNOSIS — R569 Unspecified convulsions: Secondary | ICD-10-CM | POA: Diagnosis not present

## 2023-04-07 NOTE — Patient Instructions (Signed)
  Sheryl Mcmillan, thank you for joining Claiborne Rigg, NP for today's virtual visit.  While this provider is not your primary care provider (PCP), if your PCP is located in our provider database this encounter information will be shared with them immediately following your visit.   A [place] MyChart account gives you access to today's visit and all your visits, tests, and labs performed at [facility] " click here if you don't have a [facility] MyChart account or go to [email or web address]  Consent: (Patient) Sheryl Mcmillan provided verbal consent for this virtual visit at the beginning of the encounter.  Current Medications:  Current Outpatient Medications:    cyclobenzaprine (FLEXERIL) 5 MG tablet, Take 1 tablet (5 mg total) by mouth at bedtime as needed for muscle spasms., Disp: 30 tablet, Rfl: 1   LORazepam (ATIVAN) 1 MG tablet, Take one tablet by mouth  30 min prior to mri or radiation, Disp: 4 tablet, Rfl: 0   omeprazole (PRILOSEC) 40 MG capsule, Take 1 capsule (40 mg total) by mouth daily., Disp: 90 capsule, Rfl: 0   Medications ordered in this encounter:  No orders of the defined types were placed in this encounter.    *If you need refills on other medications prior to your next appointment, please contact your pharmacy*  Follow-Up: Call back or seek an in-person evaluation if the symptoms worsen or if the condition fails to improve as anticipated.  [facility] [phone]  Other Instructions Go to the emergency room for imaging and bloodwork   If you have been instructed to have an in-person evaluation today at a local Urgent Care facility, please use the link below. It will take you to a list of all of our available [facility], including address, phone number and hours of operation. Please do not delay care.  [facility]  If you or a family member do not have a primary care provider, use the link below to  schedule a visit and establish care. When you choose a [facility] primary care physician or advanced practice provider, you gain a long-term partner in health. Find a Primary Care Provider  Learn more about [facility] in-office and virtual care options: [place] - Get Care Now

## 2023-04-07 NOTE — Progress Notes (Signed)
Virtual Visit Consent   Sheryl Mcmillan, you are scheduled for a virtual visit with a Sheryl Mcmillan provider today. Just as with appointments in the office, your consent must be obtained to participate. Your consent will be active for this visit and any virtual visit you may have with one of our providers in the next 365 days. If you have a MyChart account, a copy of this consent can be sent to you electronically.  As this is a virtual visit, video technology does not allow for your provider to perform a traditional examination. This may limit your provider's ability to fully assess your condition. If your provider identifies any concerns that need to be evaluated in person or the need to arrange testing (such as labs, EKG, etc.), we will make arrangements to do so. Although advances in technology are sophisticated, we cannot ensure that it will always work on either your end or our end. If the connection with a video visit is poor, the visit may have to be switched to a telephone visit. With either a video or telephone visit, we are not always able to ensure that we have a secure connection.  By engaging in this virtual visit, you consent to the provision of healthcare and authorize for your insurance to be billed (if applicable) for the services provided during this visit. Depending on your insurance coverage, you may receive a charge related to this service.  I need to obtain your verbal consent now. Are you willing to proceed with your visit today? Sheryl Mcmillan has provided verbal consent on 04/07/2023 for a virtual visit (video or telephone). Sheryl Rigg, NP  Date: 04/07/2023 10:29 AM  Virtual Visit via Video Note   I, Sheryl Mcmillan, connected with  Sheryl Mcmillan  ([id number], 12/22/1975) on 04/07/23 at 10:15 AM EDT by a video-enabled telemedicine application and verified that I am speaking with the correct person using two identifiers.  Location: Patient: Virtual Visit Location  Patient: Mobile Provider: Virtual Visit Location Provider: Home Office   I discussed the limitations of evaluation and management by telemedicine and the availability of in person appointments. The patient expressed understanding and agreed to proceed.    History of Present Illness: Sheryl Mcmillan is a 47 y.o. who identifies as a female who was assigned female at birth, and is being seen today for possible seizure.  Sheryl Mcmillan is currently out of town with her gf. She has a history of metastatic breast CA to brain. Believes she had a seizure this morning. States she remembers sitting up on the side of the bed getting ready to get up and go to the bathroom. That's all she remembers at this time. States when she woke up EMS was present and was going to take her to the hospital however she was confused and refused for them to take her because at the time she did not remember the seizure and did not think she needed to go. Her gf states she was shaking, foaming at the mouth and had urinary incontinence.  Currently Sheryl Mcmillan states her right side is very sore especially her arm and she feels drowsy.     Problems:  Patient Active Problem List   Diagnosis Date Noted   Brain tumor (HCC) 11/08/2021   Metastatic cancer to brain [facility]) 11/08/2021   Cerebral edema (HCC) 11/08/2021   Complex partial seizure (HCC) 11/08/2021   Brain mass 11/07/2021   Right sided weakness 11/07/2021   IDA (iron deficiency  anemia) 07/18/2021   Cholelithiasis 03/14/2021   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   Morbid obesity with BMI of 40.0-44.9, adult (HCC) 07/19/2020   BRCA1 gene mutation positive 07/04/2020   Genetic testing 06/28/2020   Family history of non-Hodgkin's lymphoma 06/01/2020   Malignant neoplasm of upper-outer quadrant of left breast in female, estrogen receptor positive (HCC) 05/26/2020   Acute right ankle pain 05/16/2016   Nondisplaced fracture of lateral malleolus of right  fibula, initial encounter for closed fracture 05/16/2016    Allergies:  Allergies  Allergen Reactions   Carboplatin Hives and Itching    Carboplatin reaction after dose #5 requiring Benadryl, Solumedrol, epinephrine, Claritin.   Other Hives    Baking Soda   Wound Dressing Adhesive Hives    Rips skin, prefers paper tape   Medications:  Current Outpatient Medications:    cyclobenzaprine (FLEXERIL) 5 MG tablet, Take 1 tablet (5 mg total) by mouth at bedtime as needed for muscle spasms., Disp: 30 tablet, Rfl: 1   LORazepam (ATIVAN) 1 MG tablet, Take one tablet by mouth  30 min prior to mri or radiation, Disp: 4 tablet, Rfl: 0   omeprazole (PRILOSEC) 40 MG capsule, Take 1 capsule (40 mg total) by mouth daily., Disp: 90 capsule, Rfl: 0  Observations/Objective: Patient is well-developed, well-nourished in no acute distress.  Resting comfortably in passenger seat of car.  Head is normocephalic, atraumatic.  No labored breathing.  Speech is clear and coherent with logical content.  Patient is alert and oriented at baseline.    Assessment and Plan: 1. Witnessed seizure-like activity (HCC) Go to the emergency room for imaging and blood work She verbalized understanding  Follow Up Instructions: I discussed the assessment and treatment plan with the patient. The patient was provided an opportunity to ask questions and all were answered. The patient agreed with the plan and demonstrated an understanding of the instructions.  A copy of instructions were sent to the patient via MyChart unless otherwise noted below.    The patient was advised to call back or seek an in-person evaluation if the symptoms worsen or if the condition fails to improve as anticipated.    Sheryl Rigg, NP

## 2023-04-09 ENCOUNTER — Other Ambulatory Visit: Payer: Medicaid Other

## 2023-04-10 ENCOUNTER — Ambulatory Visit: Payer: Medicaid Other | Admitting: Family

## 2023-04-10 ENCOUNTER — Ambulatory Visit: Payer: Medicaid Other | Admitting: Physical Therapy

## 2023-04-10 ENCOUNTER — Other Ambulatory Visit: Payer: Medicaid Other

## 2023-04-10 ENCOUNTER — Encounter: Payer: Self-pay | Admitting: Family

## 2023-04-10 ENCOUNTER — Encounter: Payer: Self-pay | Admitting: Physical Therapy

## 2023-04-10 VITALS — BP 137/87 | HR 86 | Temp 98.8°F | Ht 71.0 in | Wt 292.2 lb

## 2023-04-10 DIAGNOSIS — R2689 Other abnormalities of gait and mobility: Secondary | ICD-10-CM

## 2023-04-10 DIAGNOSIS — Z130 Encounter for screening for diseases of the blood and blood-forming organs and certain disorders involving the immune mechanism: Secondary | ICD-10-CM

## 2023-04-10 DIAGNOSIS — Z114 Encounter for screening for human immunodeficiency virus [HIV]: Secondary | ICD-10-CM

## 2023-04-10 DIAGNOSIS — Z1159 Encounter for screening for other viral diseases: Secondary | ICD-10-CM

## 2023-04-10 DIAGNOSIS — C7931 Secondary malignant neoplasm of brain: Secondary | ICD-10-CM | POA: Diagnosis not present

## 2023-04-10 DIAGNOSIS — Z1329 Encounter for screening for other suspected endocrine disorder: Secondary | ICD-10-CM

## 2023-04-10 DIAGNOSIS — R262 Difficulty in walking, not elsewhere classified: Secondary | ICD-10-CM

## 2023-04-10 DIAGNOSIS — Z13228 Encounter for screening for other metabolic disorders: Secondary | ICD-10-CM

## 2023-04-10 DIAGNOSIS — R569 Unspecified convulsions: Secondary | ICD-10-CM

## 2023-04-10 DIAGNOSIS — M62838 Other muscle spasm: Secondary | ICD-10-CM

## 2023-04-10 DIAGNOSIS — M6281 Muscle weakness (generalized): Secondary | ICD-10-CM

## 2023-04-10 DIAGNOSIS — Z13220 Encounter for screening for lipoid disorders: Secondary | ICD-10-CM

## 2023-04-10 DIAGNOSIS — R2681 Unsteadiness on feet: Secondary | ICD-10-CM

## 2023-04-10 DIAGNOSIS — R278 Other lack of coordination: Secondary | ICD-10-CM

## 2023-04-10 DIAGNOSIS — Z131 Encounter for screening for diabetes mellitus: Secondary | ICD-10-CM

## 2023-04-10 NOTE — Progress Notes (Signed)
Patient states she started having seizures 04/06/2023 and had two on that Sunday.  Declined Flu

## 2023-04-10 NOTE — Progress Notes (Signed)
Patient came in fro labs today . Tolerated it well

## 2023-04-10 NOTE — Progress Notes (Signed)
Patient ID: Sheryl Mcmillan, female    DOB: [birth year]  MRN: [id number]  CC: Emergency Department Follow-Up  Subjective: Sheryl Mcmillan is a 47 y.o. female who presents for Emergency Department follow-up. She is accompanied by her daughter.   Her concerns today include:  04/07/2023 [facility] Emergency Department per MD note: **Assessment and Recommendations** Assessment: 47 year old presents with focal motor seizure and secondary generaliztion.  Loaded with Keppra one gram. Recommendations: Awaiting head CT.  Barring any significant findings she can be discharged to follow up with her physicians in [place]. She is scheduled to return home two days from now.  Continue Keppra 500 mg bid.  Reviewed seizure precautions with the patient including no driving, operating heavy machinery, climbing heights or swimming unsupervised. I have advised her to take showers instead of baths. Disposition: Medical work up and admit/discharge per referring provider   Today's office visit 04/10/2023: Reports no seizures since Emergency Department discharge. Doing well on Keppra, no issues/concerns. Daughter reports patient not getting adequate rest. Patient aware to keep all scheduled appointments with established specialists. No further issues/concerns for discussion today.   Patient Active Problem List   Diagnosis Date Noted   Brain tumor (HCC) 11/08/2021   Metastatic cancer to brain (HCC) 11/08/2021   Cerebral edema (HCC) 11/08/2021   Complex partial seizure (HCC) 11/08/2021   Brain mass 11/07/2021   Right sided weakness 11/07/2021   IDA (iron deficiency anemia) 07/18/2021   Cholelithiasis 03/14/2021   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   Morbid obesity with BMI of 40.0-44.9, adult (HCC) 07/19/2020   BRCA1 gene mutation positive 07/04/2020   Genetic testing 06/28/2020   Family history of non-Hodgkin's lymphoma 06/01/2020   Malignant neoplasm of upper-outer  quadrant of left breast in female, estrogen receptor positive (HCC) 05/26/2020   Acute right ankle pain 05/16/2016   Nondisplaced fracture of lateral malleolus of right fibula, initial encounter for closed fracture 05/16/2016     Current Outpatient Medications on File Prior to Visit  Medication Sig Dispense Refill   cyclobenzaprine (FLEXERIL) 5 MG tablet Take 1 tablet (5 mg total) by mouth at bedtime as needed for muscle spasms. 30 tablet 1   omeprazole (PRILOSEC) 40 MG capsule Take 1 capsule (40 mg total) by mouth daily. 90 capsule 0   LORazepam (ATIVAN) 1 MG tablet Take one tablet by mouth  30 min prior to mri or radiation 4 tablet 0   No current facility-administered medications on file prior to visit.    Allergies  Allergen Reactions   Carboplatin Hives and Itching    Carboplatin reaction after dose #5 requiring Benadryl, Solumedrol, epinephrine, Claritin.   Other Hives    Baking Soda   Wound Dressing Adhesive Hives    Rips skin, prefers paper tape    Social History   Socioeconomic History   Marital status: Single    Spouse name: Not on file   Number of children: 1   Years of education: Not on file   Highest education level: Some college, no degree  Occupational History   Not on file  Tobacco Use   Smoking status: Former    Current packs/day: 0.00    Average packs/day: 0.5 packs/day for 10.0 years (5.0 ttl pk-yrs)    Types: Cigarettes    Start date: 08/24/2010    Quit date: 08/23/2020    Years since quitting: 2.6    Passive exposure: Never   Smokeless tobacco: Never  Vaping Use  Vaping status: Never Used  Substance and Sexual Activity   Alcohol use: Not Currently    Comment: rare   Drug use: Yes    Types: Marijuana    Comment: Last use 11/07/21   Sexual activity: Yes    Birth control/protection: Surgical    Comment: Hysterectomy  Other Topics Concern   Not on file  Social History Narrative   Not on file   Social Determinants of Health   Financial Resource  Strain: Not on file  Food Insecurity: No Food Insecurity (10/15/2022)   Hunger Vital Sign    Worried About Running Out of Food in the Last Year: Never true    Ran Out of Food in the Last Year: Never true  Transportation Needs: No Transportation Needs (05/05/2020)   PRAPARE - Administrator, Civil Service (Medical): No    Lack of Transportation (Non-Medical): No  Physical Activity: Not on file  Stress: Not on file  Social Connections: Not on file  Intimate Partner Violence: Not At Risk (10/15/2022)   Humiliation, Afraid, Rape, and Kick questionnaire    Fear of Current or Ex-Partner: No    Emotionally Abused: No    Physically Abused: No    Sexually Abused: No    Family History  Problem Relation Age of Onset   Hypertension Mother    Diabetes Mother    Cancer Mother 87       unknown primary   Other Mother        brain tumor; dx 64s   Non-Hodgkin's lymphoma Brother 44   Other Maternal Uncle 80       brain tumor   Breast cancer Other        MGM's niece; dx late 2s   Pancreatic cancer Neg Hx    Colon cancer Neg Hx    Endometrial cancer Neg Hx    Prostate cancer Neg Hx    Ovarian cancer Neg Hx     Past Surgical History:  Procedure Laterality Date   APPLICATION OF CRANIAL NAVIGATION Left 11/10/2021   Procedure: APPLICATION OF CRANIAL NAVIGATION;  Surgeon: [name], MD;  [facility];  Service: Neurosurgery;  Laterality: Left;   AXILLARY SENTINEL NODE BIOPSY Left 11/07/2020   Procedure: LEFT AXILLARY SENTINEL NODE BIOPSY;  Surgeon: [name], MD;  Location: [facility] OR;  Service: General;  Laterality: Left;   BREAST CYST EXCISION Right 09/22/2021   Procedure: Excision of right axillary excess soft tissue.;  Surgeon: [name], MD;  Location: [facility];  Service: Plastics;  Laterality: Right;   BREAST RECONSTRUCTION WITH PLACEMENT OF TISSUE EXPANDER AND FLEX HD (ACELLULAR HYDRATED DERMIS) Bilateral 11/07/2020   Procedure: BILATERAL  BREAST RECONSTRUCTION WITH PLACEMENT OF TISSUE EXPANDER AND FLEX HD (ACELLULAR HYDRATED DERMIS);  Surgeon: [name], MD;  Location: [facility] OR;  Service: Plastics;  Laterality: Bilateral;   COSMETIC SURGERY     CRANIOTOMY Left 11/10/2021   Procedure: LT FRONTAL CRANIOTOMY TUMOR EXCISION;  Surgeon: [name], MD;  [facility];  Service: Neurosurgery;  Laterality: Left;   DILATION AND CURETTAGE OF UTERUS     HEMORRHOID SURGERY     IR IMAGING GUIDED PORT INSERTION  06/15/2020   LIPOSUCTION WITH LIPOFILLING Bilateral 09/22/2021   Procedure: Fat grafting bilateral breast;  Surgeon: [name], MD;  Location: [facility];  Service: Plastics;  Laterality: Bilateral;   MASS EXCISION Bilateral 04/25/2021   Procedure: excision of bilateral axillary breast tissue;  Surgeon: Arita Miss,  Wendy Poet, MD;  Location: [facility];  Service: Plastics;  Laterality: Bilateral;   PORT-A-CATH REMOVAL Right 11/07/2020   Procedure: REMOVAL PORT-A-CATH;  Surgeon: [name], MD;  Location: [facility] OR;  Service: General;  Laterality: Right;   REMOVAL OF BILATERAL TISSUE EXPANDERS WITH PLACEMENT OF BILATERAL BREAST IMPLANTS Bilateral 04/25/2021   Procedure: REMOVAL OF BILATERAL TISSUE EXPANDERS WITH PLACEMENT OF BILATERAL BREAST IMPLANTS;  Surgeon: [name], MD;  Location: [facility];  Service: Plastics;  Laterality: Bilateral;   ROBOTIC ASSISTED TOTAL HYSTERECTOMY WITH BILATERAL SALPINGO OOPHERECTOMY Bilateral 03/14/2021   Procedure: XI ROBOTIC ASSISTED TOTAL HYSTERECTOMY WITH BILATERAL SALPINGO OOPHORECTOMY;  Surgeon: [name], MD;  Location: [facility];  Service: Gynecology;  Laterality: Bilateral;   TOTAL MASTECTOMY Bilateral 11/07/2020   Procedure: BILATERAL MASTECTOMY;  Surgeon: [name], MD;  [facility];  Service: General;  Laterality: Bilateral;    ROS: Review of Systems Negative except as stated above  PHYSICAL EXAM: BP 137/87   Pulse  86   Temp 98.8 F (37.1 C) (Oral)   Ht 5\' 11"  (1.803 m)   Wt 292 lb 3.2 oz (132.5 kg)   LMP 06/08/2020   SpO2 95%   BMI 40.75 kg/m   Physical Exam HENT:     Head: Normocephalic and atraumatic.     Nose: Nose normal.     Mouth/Throat:     Mouth: Mucous membranes are moist.     Pharynx: Oropharynx is clear.  Eyes:     Extraocular Movements: Extraocular movements intact.     Conjunctiva/sclera: Conjunctivae normal.     Pupils: Pupils are equal, round, and reactive to light.  Cardiovascular:     Rate and Rhythm: Normal rate and regular rhythm.     Pulses: Normal pulses.     Heart sounds: Normal heart sounds.  Pulmonary:     Effort: Pulmonary effort is normal.     Breath sounds: Normal breath sounds.  Musculoskeletal:        General: Normal range of motion.     Right shoulder: Normal.     Left shoulder: Normal.     Right upper arm: Normal.     Left upper arm: Normal.     Right elbow: Normal.     Left elbow: Normal.     Right forearm: Normal.     Left forearm: Normal.     Right wrist: Normal.     Left wrist: Normal.     Right hand: Normal.     Left hand: Normal.     Cervical back: Normal, normal range of motion and neck supple.     Thoracic back: Normal.     Lumbar back: Normal.     Right hip: Normal.     Left hip: Normal.     Right upper leg: Normal.     Left upper leg: Normal.     Right knee: Normal.     Left knee: Normal.     Right lower leg: Normal.     Left lower leg: Normal.     Right ankle: Normal.     Left ankle: Normal.     Right foot: Normal.     Left foot: Normal.  Neurological:     General: No focal deficit present.     Mental Status: She is alert and oriented to person, place, and time.  Psychiatric:        Mood and Affect: Mood normal.        Behavior:  Behavior normal.    ASSESSMENT AND PLAN: 1. Seizures (HCC) 2. Secondary malignant neoplasm of brain [facility]) - Continue present management.  - Referral to Neurology for evaluation/management.  During the interim follow-up with primary provider as scheduled until established with referral. - Ambulatory referral to Neurology   Patient was given the opportunity to ask questions.  Patient verbalized understanding of the plan and was able to repeat key elements of the plan. Patient was given clear instructions to go to Emergency Department or return to medical center if symptoms don't improve, worsen, or new problems develop.The patient verbalized understanding.   Orders Placed This Encounter  Procedures   Ambulatory referral to Neurology    Follow-up with primary provider as scheduled.  Rema Fendt, NP

## 2023-04-10 NOTE — Therapy (Signed)
OUTPATIENT PHYSICAL THERAPY NEURO TREATMENT   Patient Name: Sheryl Mcmillan MRN: [id number] DOB:14-Sep-1975, 47 y.o., female Today's Date: 04/10/2023   PCP: [name], PA-C REFERRING PROVIDER: [name], MD   PT End of Session - 04/10/23 1243     Visit Number 3    Number of Visits 15    Date for PT Re-Evaluation 06/25/23    Authorization Type [employer]    PT Start Time 1230    PT Stop Time 1315    PT Time Calculation (min) 45 min    Activity Tolerance Patient tolerated treatment well    Behavior During Therapy [facility] for tasks assessed/performed              Past Medical History:  Diagnosis Date   Arthritis    Breast cancer (HCC)    Family history of non-Hodgkin's lymphoma 06/01/2020   GERD (gastroesophageal reflux disease)    Hemorrhoids    Migraines    PCOS (polycystic ovarian syndrome)    PONV (postoperative nausea and vomiting)    Past Surgical History:  Procedure Laterality Date   APPLICATION OF CRANIAL NAVIGATION Left 11/10/2021   Procedure: APPLICATION OF CRANIAL NAVIGATION;  Surgeon: [name], MD;  [facility];  Service: Neurosurgery;  Laterality: Left;   AXILLARY SENTINEL NODE BIOPSY Left 11/07/2020   Procedure: LEFT AXILLARY SENTINEL NODE BIOPSY;  Surgeon: [name], MD;  Location: [facility] OR;  Service: General;  Laterality: Left;   BREAST CYST EXCISION Right 09/22/2021   Procedure: Excision of right axillary excess soft tissue.;  Surgeon: [name], MD;  Location: [facility];  Service: Plastics;  Laterality: Right;   BREAST RECONSTRUCTION WITH PLACEMENT OF TISSUE EXPANDER AND FLEX HD (ACELLULAR HYDRATED DERMIS) Bilateral 11/07/2020   Procedure: BILATERAL BREAST RECONSTRUCTION WITH PLACEMENT OF TISSUE EXPANDER AND FLEX HD (ACELLULAR HYDRATED DERMIS);  Surgeon: [name], MD;  Location: [facility] OR;  Service: Plastics;  Laterality: Bilateral;   COSMETIC SURGERY     CRANIOTOMY Left 11/10/2021   Procedure: LT FRONTAL  CRANIOTOMY TUMOR EXCISION;  Surgeon: [name], MD;  Location[facility];  Service: Neurosurgery;  Laterality: Left;   DILATION AND CURETTAGE OF UTERUS     HEMORRHOID SURGERY     IR IMAGING GUIDED PORT INSERTION  06/15/2020   LIPOSUCTION WITH LIPOFILLING Bilateral 09/22/2021   Procedure: Fat grafting bilateral breast;  Surgeon: [name], MD;  Location: [facility];  Service: Plastics;  Laterality: Bilateral;   MASS EXCISION Bilateral 04/25/2021   Procedure: excision of bilateral axillary breast tissue;  Surgeon: [name], MD;  Location: [facility];  Service: Plastics;  Laterality: Bilateral;   PORT-A-CATH REMOVAL Right 11/07/2020   Procedure: REMOVAL PORT-A-CATH;  Surgeon: [name], MD;  Location: [facility] OR;  Service: General;  Laterality: Right;   REMOVAL OF BILATERAL TISSUE EXPANDERS WITH PLACEMENT OF BILATERAL BREAST IMPLANTS Bilateral 04/25/2021   Procedure: REMOVAL OF BILATERAL TISSUE EXPANDERS WITH PLACEMENT OF BILATERAL BREAST IMPLANTS;  Surgeon: [name], MD;  Location: [facility];  Service: Plastics;  Laterality: Bilateral;   ROBOTIC ASSISTED TOTAL HYSTERECTOMY WITH BILATERAL SALPINGO OOPHERECTOMY Bilateral 03/14/2021   Procedure: XI ROBOTIC ASSISTED TOTAL HYSTERECTOMY WITH BILATERAL SALPINGO OOPHORECTOMY;  Surgeon: [name], MD;  Location: [facility];  Service: Gynecology;  Laterality: Bilateral;   TOTAL MASTECTOMY Bilateral 11/07/2020   Procedure: BILATERAL MASTECTOMY;  Surgeon: [name], MD;  Location: [facility] OR;  Service: General;  Laterality: Bilateral;   Patient Active Problem List  Diagnosis Date Noted   Brain tumor (HCC) 11/08/2021   Metastatic cancer to brain (HCC) 11/08/2021   Cerebral edema (HCC) 11/08/2021   Complex partial seizure (HCC) 11/08/2021   Brain mass 11/07/2021   Right sided weakness 11/07/2021   IDA (iron deficiency anemia) 07/18/2021   Cholelithiasis 03/14/2021   Peripheral  neuropathy due to chemotherapy (HCC) 10/20/2020   Morbid obesity with BMI of 40.0-44.9, adult (HCC) 07/19/2020   BRCA1 gene mutation positive 07/04/2020   Genetic testing 06/28/2020   Family history of non-Hodgkin's lymphoma 06/01/2020   Malignant neoplasm of upper-outer quadrant of left breast in female, estrogen receptor positive (HCC) 05/26/2020   Acute right ankle pain 05/16/2016   Nondisplaced fracture of lateral malleolus of right fibula, initial encounter for closed fracture 05/16/2016    ONSET DATE: 12/05/2021 (MD referral)  REFERRING DIAG: D49.6 (ICD-10-CM) - Neoplasm of unspecified behavior of brain  THERAPY DIAG:  Muscle weakness (generalized)  Unsteadiness on feet  Difficulty in walking, not elsewhere classified  Other lack of coordination  Other abnormalities of gait and mobility  Rationale for Evaluation and Treatment Rehabilitation  SUBJECTIVE:                                                                                                                                                                                              SUBJECTIVE STATEMENT: I went to [place].  She reports that she had a few seizures and was in the floor once.  She sees MD this afternoon, had bloodwork done this AM Pt accompanied by: self  PERTINENT HISTORY: metastatic breast cancer, brain tumor s/p excision with craniotomy; GERD, morbid obesity.  Admitted to rehab ([facility]) 11/17/21 and d/c home 12/09/2021  PAIN:  Are you having pain? No  PRECAUTIONS: Fall and Other: no driving  FALLS: Has patient fallen in last 6 months? 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 Now s/p L frontal crani tumor excision 5/19.  COGNITION: Overall cognitive status: Within functional limits for tasks assessed  SENSATION:  Light touch: WFL  COORDINATION: Decreased coordination RLE-needs assist of LLE or UEs to place RLE   EDEMA:  none  MUSCLE TONE: RLE: Hypotonic   POSTURE: rounded shoulders  LOWER EXTREMITY ROM:     Active Right Eval Left Eval  Hip flexion    Hip extension    Hip abduction    Hip adduction    Hip internal rotation    Hip external rotation    Knee flexion    Knee extension    Ankle dorsiflexion 3   Ankle plantarflexion 30   Ankle inversion 10   Ankle eversion 0    (Blank rows = not tested)  LOWER EXTREMITY MMT:    MMT Right Eval Left Eval  Hip flexion 2+/5 4/+5  Hip extension    Hip abduction 3-/5 4/+5  Hip adduction 3+/5 4/5  Hip internal rotation    Hip external rotation    Knee flexion 3-+5 4+/5  Knee extension 3-+5 4+/5  Ankle dorsiflexion 2+ 5/5  Ankle plantarflexion 2   Ankle inversion 2+   Ankle eversion 0   (Blank rows = not tested)   TRANSFERS: Assistive device utilized: no device  Sit to stand: Independent Stand to sit: Independent  GAIT: Gait pattern: step through pattern, decreased step length- Left, decreased stance time- Right, decreased ankle dorsiflexion- Right, and genu recurvatum- Right decreased control of the right LE especially the foot Distance walked: 60 ft  Assistive device utilized:no device Level of assistance: supervision Going down staris is one at a time, has to use handrail  FUNCTIONAL TESTs:  TUG 14 seconds no device 5XSTS = 18 seconds Berg 50/56 SLS right <2 seconds, left 6 seconds DGI  10   PATIENT EDUCATION: Education details: PT POC, Eval results, Person educated: Patient Education method: education Education comprehension: verbalized understanding and returned demonstration  Today Treatment 04/10/23 Nustep level 5 x 6 minutes Standing cone toe touches on and off airex Side stepping PROM/stretches of the UE's and the LE's STM to the neck and upper traps   04/01/23 NuStep L 5 x 6 min Alt 6in box taps 2x10 S2S holding yellow ball 2x10 Side step & Tamdem walking on airex in // bars  HS curls 35lb 2x10, RLE 20lb x10 Leg Ext 15lb 2x10   HOME EXERCISE PROGRAM:    GOALS: Goals reviewed with patient? Yes  SHORT TERM GOALS: Target date: 04/09/23  Pt will be independent with HEP for improved strength, balance, gait. Baseline:  Initial HEP from CIR Goal status:ongoing 04/10/23  LONG TERM GOALS: Target date: 06/18/23  Pt will be independent with HEP for improved strength, balance, transfers, and gait. Goal status: INITIAL  2.  Pt will improve 5x sit<>stand to less than or equal to 15 sec to demonstrate improved functional strength and transfer efficiency. Goal status: INITIAL  3.  Pt will improve TUG score to less than or equal to 12 sec for decreased fall risk. Goal status: INITIAL  4.  Increase Berg balance score to 53/56 Baseline: 50/56 Goal status: INITIAL  5.  Stand on the left leg 10 seconds Goal status: INITIAL  ASSESSMENT:  CLINICAL IMPRESSION: Patient is a 47 y.o. female who was seen today for physical therapy treatment for metastatic brain cancer, s/p excision with craniotomy on 11/10/2021. Pt has R hemiplegia, with decreased muscle strength, decreased flexibility, decreased balance, decreased independence with gait.  She has been travelling a lot recently, went to [place], came back for a few days and then went  to [place].  While in [place] she started having seizures, She went to the ED there, I can see results of bloodwork and CT,  all was negative.  She had blood work done this AM and will see her primary care this afternoon, she is obviously stressed and said about 3 times during our session, "I don't want anything else." Se discussed the results from the ED in CT and did some STM due to how tight and tense she was.  She would benefit from skilled PT to address the stated deficits to decrease fall risk and improve functional mobility and independence.  OBJECTIVE IMPAIRMENTS Abnormal gait, decreased balance, decreased mobility, difficulty walking, decreased ROM, decreased strength, impaired flexibility, and impaired tone.   ACTIVITY LIMITATIONS standing, transfers, locomotion level, and caring for others  PARTICIPATION LIMITATIONS: meal prep, driving, shopping, community activity, and occupation  PERSONAL FACTORS 3+ comorbidities: see above for PMH  are also affecting patient's functional outcome.   REHAB POTENTIAL: Good  CLINICAL DECISION MAKING: Evolving/moderate complexity  EVALUATION COMPLEXITY: Moderate  PLAN: PT FREQUENCY: 2x/week  PT DURATION: 12 weeks plus eval  PLANNED INTERVENTIONS: Therapeutic exercises, Therapeutic activity, Neuromuscular re-education, Balance training, Gait training, Patient/Family education, Joint mobilization, Orthotic/Fit training, DME instructions, Aquatic Therapy, and Manual therapy  PLAN FOR NEXT SESSION: see how she is doing and progress accordingly, monitor her for any seizures or other issues  Jearld Lesch, PT 04/10/2023, 12:44 PM

## 2023-04-11 ENCOUNTER — Encounter (HOSPITAL_BASED_OUTPATIENT_CLINIC_OR_DEPARTMENT_OTHER): Payer: Self-pay | Admitting: Urology

## 2023-04-11 ENCOUNTER — Other Ambulatory Visit: Payer: Self-pay

## 2023-04-11 ENCOUNTER — Inpatient Hospital Stay (HOSPITAL_BASED_OUTPATIENT_CLINIC_OR_DEPARTMENT_OTHER)
Admission: EM | Admit: 2023-04-11 | Discharge: 2023-04-13 | DRG: 101 | Disposition: A | Discharge status: Home / Self Care | Payer: Medicaid Other | Attending: Internal Medicine | Admitting: Internal Medicine

## 2023-04-11 ENCOUNTER — Other Ambulatory Visit: Payer: Self-pay | Admitting: Family

## 2023-04-11 ENCOUNTER — Inpatient Hospital Stay (HOSPITAL_COMMUNITY): Payer: Medicaid Other

## 2023-04-11 DIAGNOSIS — E785 Hyperlipidemia, unspecified: Secondary | ICD-10-CM | POA: Insufficient documentation

## 2023-04-11 DIAGNOSIS — Z888 Allergy status to other drugs, medicaments and biological substances status: Secondary | ICD-10-CM | POA: Diagnosis not present

## 2023-04-11 DIAGNOSIS — Z803 Family history of malignant neoplasm of breast: Secondary | ICD-10-CM

## 2023-04-11 DIAGNOSIS — E66813 Obesity, class 3: Secondary | ICD-10-CM | POA: Diagnosis present

## 2023-04-11 DIAGNOSIS — Z809 Family history of malignant neoplasm, unspecified: Secondary | ICD-10-CM

## 2023-04-11 DIAGNOSIS — Z923 Personal history of irradiation: Secondary | ICD-10-CM

## 2023-04-11 DIAGNOSIS — Z9013 Acquired absence of bilateral breasts and nipples: Secondary | ICD-10-CM | POA: Diagnosis not present

## 2023-04-11 DIAGNOSIS — Z87891 Personal history of nicotine dependence: Secondary | ICD-10-CM | POA: Diagnosis not present

## 2023-04-11 DIAGNOSIS — D72829 Elevated white blood cell count, unspecified: Secondary | ICD-10-CM | POA: Diagnosis present

## 2023-04-11 DIAGNOSIS — K219 Gastro-esophageal reflux disease without esophagitis: Secondary | ICD-10-CM | POA: Diagnosis present

## 2023-04-11 DIAGNOSIS — R569 Unspecified convulsions: Principal | ICD-10-CM

## 2023-04-11 DIAGNOSIS — C7931 Secondary malignant neoplasm of brain: Secondary | ICD-10-CM | POA: Diagnosis present

## 2023-04-11 DIAGNOSIS — Z9882 Breast implant status: Secondary | ICD-10-CM

## 2023-04-11 DIAGNOSIS — Z8249 Family history of ischemic heart disease and other diseases of the circulatory system: Secondary | ICD-10-CM | POA: Diagnosis not present

## 2023-04-11 DIAGNOSIS — Z9102 Food additives allergy status: Secondary | ICD-10-CM

## 2023-04-11 DIAGNOSIS — Z170 Estrogen receptor positive status [ER+]: Secondary | ICD-10-CM | POA: Diagnosis not present

## 2023-04-11 DIAGNOSIS — C50412 Malignant neoplasm of upper-outer quadrant of left female breast: Secondary | ICD-10-CM | POA: Diagnosis present

## 2023-04-11 DIAGNOSIS — Z91048 Other nonmedicinal substance allergy status: Secondary | ICD-10-CM | POA: Diagnosis not present

## 2023-04-11 DIAGNOSIS — Z807 Family history of other malignant neoplasms of lymphoid, hematopoietic and related tissues: Secondary | ICD-10-CM | POA: Diagnosis not present

## 2023-04-11 DIAGNOSIS — Z833 Family history of diabetes mellitus: Secondary | ICD-10-CM

## 2023-04-11 DIAGNOSIS — Z6841 Body Mass Index (BMI) 40.0 and over, adult: Secondary | ICD-10-CM

## 2023-04-11 DIAGNOSIS — G40909 Epilepsy, unspecified, not intractable, without status epilepticus: Secondary | ICD-10-CM | POA: Diagnosis present

## 2023-04-11 DIAGNOSIS — Z79899 Other long term (current) drug therapy: Secondary | ICD-10-CM

## 2023-04-11 LAB — CBC
Hematocrit: 46.1 % (ref 34.0–46.6)
Hemoglobin: 14.5 g/dL (ref 11.1–15.9)
MCH: 27.9 pg (ref 26.6–33.0)
MCHC: 31.5 g/dL (ref 31.5–35.7)
MCV: 89 fL (ref 79–97)
Platelets: 284 10*3/uL (ref 150–450)
RBC: 5.19 x10E6/uL (ref 3.77–5.28)
RDW: 14.3 % (ref 11.7–15.4)
WBC: 9.8 10*3/uL (ref 3.4–10.8)

## 2023-04-11 LAB — LIPID PANEL
Chol/HDL Ratio: 5 {ratio} — ABNORMAL HIGH (ref 0.0–4.4)
Cholesterol, Total: 226 mg/dL — ABNORMAL HIGH (ref 100–199)
HDL: 45 mg/dL (ref >39)
LDL Chol Calc (NIH): 156 mg/dL — ABNORMAL HIGH (ref 0–99)
Triglycerides: 138 mg/dL (ref 0–149)
VLDL Cholesterol Cal: 25 mg/dL (ref 5–40)

## 2023-04-11 LAB — CMP14+EGFR
ALT: 29 [IU]/L (ref 0–32)
AST: 23 [IU]/L (ref 0–40)
Albumin: 4.2 g/dL (ref 3.9–4.9)
Alkaline Phosphatase: 126 [IU]/L — ABNORMAL HIGH (ref 44–121)
BUN/Creatinine Ratio: 15 (ref 9–23)
BUN: 10 mg/dL (ref 6–24)
Bilirubin Total: 0.2 mg/dL (ref 0.0–1.2)
CO2: 20 mmol/L (ref 20–29)
Calcium: 9.4 mg/dL (ref 8.7–10.2)
Chloride: 107 mmol/L — ABNORMAL HIGH (ref 96–106)
Creatinine, Ser: 0.65 mg/dL (ref 0.57–1.00)
Globulin, Total: 2.3 g/dL (ref 1.5–4.5)
Glucose: 107 mg/dL — ABNORMAL HIGH (ref 70–99)
Potassium: 4.8 mmol/L (ref 3.5–5.2)
Sodium: 145 mmol/L — ABNORMAL HIGH (ref 134–144)
Total Protein: 6.5 g/dL (ref 6.0–8.5)
eGFR: 110 mL/min/{1.73_m2} (ref >59)

## 2023-04-11 LAB — BASIC METABOLIC PANEL
Anion gap: 14 (ref 5–15)
BUN: 9 mg/dL (ref 6–20)
CO2: 24 mmol/L (ref 22–32)
Calcium: 9.1 mg/dL (ref 8.9–10.3)
Chloride: 100 mmol/L (ref 98–111)
Creatinine, Ser: 0.8 mg/dL (ref 0.44–1.00)
GFR, Estimated: >60 mL/min (ref >60)
Glucose, Bld: 127 mg/dL — ABNORMAL HIGH (ref 70–99)
Potassium: 4.2 mmol/L (ref 3.5–5.1)
Sodium: 138 mmol/L (ref 135–145)

## 2023-04-11 LAB — CBC WITH DIFFERENTIAL/PLATELET
Abs Immature Granulocytes: 0.04 10*3/uL (ref 0.00–0.07)
Basophils Absolute: 0.1 10*3/uL (ref 0.0–0.1)
Basophils Relative: 1 %
Eosinophils Absolute: 0.1 10*3/uL (ref 0.0–0.5)
Eosinophils Relative: 1 %
HCT: 44.2 % (ref 36.0–46.0)
Hemoglobin: 14.6 g/dL (ref 12.0–15.0)
Immature Granulocytes: 0 %
Lymphocytes Relative: 30 %
Lymphs Abs: 3.3 10*3/uL (ref 0.7–4.0)
MCH: 28.3 pg (ref 26.0–34.0)
MCHC: 33 g/dL (ref 30.0–36.0)
MCV: 85.7 fL (ref 80.0–100.0)
Monocytes Absolute: 0.5 10*3/uL (ref 0.1–1.0)
Monocytes Relative: 4 %
Neutro Abs: 6.8 10*3/uL (ref 1.7–7.7)
Neutrophils Relative %: 64 %
Platelets: 330 10*3/uL (ref 150–400)
RBC: 5.16 MIL/uL — ABNORMAL HIGH (ref 3.87–5.11)
RDW: 14.4 % (ref 11.5–15.5)
WBC: 10.7 10*3/uL — ABNORMAL HIGH (ref 4.0–10.5)
nRBC: 0 % (ref 0.0–0.2)

## 2023-04-11 LAB — HEMOGLOBIN A1C
Est. average glucose Bld gHb Est-mCnc: 134 mg/dL
Hgb A1c MFr Bld: 6.3 % — ABNORMAL HIGH (ref 4.8–5.6)

## 2023-04-11 LAB — HIV ANTIBODY (ROUTINE TESTING W REFLEX): HIV Screen 4th Generation wRfx: NONREACTIVE

## 2023-04-11 LAB — HEPATITIS C ANTIBODY: Hep C Virus Ab: NONREACTIVE

## 2023-04-11 LAB — VITAMIN D 25 HYDROXY (VIT D DEFICIENCY, FRACTURES): Vit D, 25-Hydroxy: 26.7 ng/mL — ABNORMAL LOW (ref 30.0–100.0)

## 2023-04-11 LAB — TSH: TSH: 2.14 u[IU]/mL (ref 0.450–4.500)

## 2023-04-11 MED ORDER — ORAL CARE MOUTH RINSE: 15.0000 mL | OROMUCOSAL | Status: DC

## 2023-04-11 MED ORDER — LEVETIRACETAM IN NACL 1000 MG/100ML IV SOLN
1000.0000 mg | Freq: Once | INTRAVENOUS | Status: AC
  Administered 2023-04-11: 1000 mg via INTRAVENOUS
  Filled 2023-04-11: qty 100

## 2023-04-11 MED ORDER — ATORVASTATIN CALCIUM 20 MG PO TABS
20.0000 mg | ORAL_TABLET | Freq: Every day | ORAL | 0 refills | Status: DC
Start: 2023-04-11 — End: 2023-04-13

## 2023-04-11 MED ORDER — ORAL CARE MOUTH RINSE: 15.0000 mL | OROMUCOSAL | Status: DC | PRN

## 2023-04-11 MED ORDER — LEVETIRACETAM IN NACL 500 MG/100ML IV SOLN
500.0000 mg | Freq: Once | INTRAVENOUS | Status: AC
  Administered 2023-04-11: 500 mg via INTRAVENOUS
  Filled 2023-04-11: qty 100

## 2023-04-11 MED ORDER — LEVETIRACETAM IN NACL 1500 MG/100ML IV SOLN: 1500.0000 mg | Freq: Once | INTRAVENOUS | Status: DC

## 2023-04-11 MED ORDER — GADOBUTROL 1 MMOL/ML IV SOLN
10.0000 mL | Freq: Once | INTRAVENOUS | Status: AC | PRN
  Administered 2023-04-11: 10 mL via INTRAVENOUS

## 2023-04-11 MED ORDER — LORAZEPAM 2 MG/ML IJ SOLN
2.0000 mg | Freq: Once | INTRAMUSCULAR | Status: AC
  Administered 2023-04-11: 2 mg via INTRAVENOUS
  Filled 2023-04-11: qty 1

## 2023-04-11 NOTE — ED Triage Notes (Signed)
Pt states was out of town and had seizures Saturday and Sunday x 2, was seen in ER and started on Keppra but didn't start taking until today  Having more yesterday and today  States was staring at the wall and left arm shaking as well as head shaking    States Brain surgery last year and has brain mets per pt  Oncology at [facility]

## 2023-04-11 NOTE — ED Provider Notes (Addendum)
[facility] Provider Note   CSN: [id number] Arrival date & time: 04/11/23  1306     History  Chief Complaint  Patient presents with   Seizures    Sheryl Mcmillan is a 47 y.o. female.  Patient presents emergency department today for evaluation of seizure-like activity.  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  She has had a previous left frontal craniotomy May 2023, radiation therapy right frontal, follows with Dr. Barbaraann Cao.  Patient was visiting [place] this past weekend.  She had 3 generalized seizures with associated bowel incontinence and was seen in the emergency department in [place] on 04/07/2023.  Patient had a CT scan at that time which was negative for acute findings.  Plan was to Keppra load, however patient was initially resistant.  She was given a prescription.  She states that she took 1 oral dose that day and then did not take again until today.  Patient's daughter at bedside reports that last night when she got home from work patient was standing, looking at the wall, minimally responsive with her right arm shaking.  This lasted about 30 seconds and then she was able to get her attention and patient gradually returned to baseline.  She had another similar episode this morning with right arm shaking.  Her head was nodding as well.  Patient has not been completely at her baseline since her seizures started this weekend.  She has been slower to respond and slower to speak, more confused, but answering appropriately.  Patient states that she "feels off".  She has not been on any antiepileptics prior to this weekend since her previous surgery.       Home Medications Prior to Admission medications   Medication Sig Start Date End Date Taking? Authorizing Provider  atorvastatin (LIPITOR) 20 MG tablet Take 1 tablet (20 mg total) by mouth daily. 04/11/23   Rema Fendt, NP  cyclobenzaprine  (FLEXERIL) 5 MG tablet Take 1 tablet (5 mg total) by mouth at bedtime as needed for muscle spasms. 04/01/23   Rema Fendt, NP  LORazepam (ATIVAN) 1 MG tablet Take one tablet by mouth  30 min prior to mri or radiation 01/16/23   Ronny Bacon, PA-C  omeprazole (PRILOSEC) 40 MG capsule Take 1 capsule (40 mg total) by mouth daily. 04/01/23 06/30/23  Rema Fendt, NP      Allergies    Carboplatin, Other, and Wound dressing adhesive    Review of Systems   Review of Systems  Physical Exam Updated Vital Signs BP 136/81 (BP Location: Right Arm)   Pulse 92   Temp 98.4 F (36.9 C) (Oral)   Resp 18   Ht 5\' 11"  (1.803 m)   Wt 132.5 kg   LMP 06/08/2020   SpO2 98%   BMI 40.74 kg/m   Physical Exam Vitals and nursing note reviewed.  Constitutional:      Appearance: She is well-developed.  HENT:     Head: Normocephalic and atraumatic.     Right Ear: External ear normal.     Left Ear: External ear normal.     Nose: Nose normal.     Mouth/Throat:     Pharynx: Uvula midline.  Eyes:     General: Lids are normal.     Extraocular Movements:     Right eye: No nystagmus.     Left eye: No nystagmus.     Conjunctiva/sclera: Conjunctivae  normal.     Pupils: Pupils are equal, round, and reactive to light.  Cardiovascular:     Rate and Rhythm: Normal rate and regular rhythm.  Pulmonary:     Effort: Pulmonary effort is normal.     Breath sounds: Normal breath sounds.  Abdominal:     Palpations: Abdomen is soft.     Tenderness: There is no abdominal tenderness.  Musculoskeletal:     Cervical back: Normal range of motion and neck supple. No tenderness or bony tenderness.  Skin:    General: Skin is warm and dry.  Neurological:     Mental Status: She is alert and oriented to person, place, and time.     GCS: GCS eye subscore is 4. GCS verbal subscore is 5. GCS motor subscore is 6.     Cranial Nerves: No cranial nerve deficit.     Sensory: No sensory deficit.     Motor: No  weakness.     Coordination: Coordination normal.     Comments: Upper extremity myotomes tested bilaterally:  C5 Shoulder abduction 5/5 C6 Elbow flexion/wrist extension 5/5 C7 Elbow extension 5/5 C8 Finger flexion 5/5 T1 Finger abduction 5/5  Lower extremity myotomes tested bilaterally: L2 Hip flexion 5/5 L3 Knee extension 5/5 L4 Ankle dorsiflexion 5/5 S1 Ankle plantar flexion 5/5      ED Results / Procedures / Treatments   Labs (all labs ordered are listed, but only abnormal results are displayed) Labs Reviewed  CBC WITH DIFFERENTIAL/PLATELET - Abnormal; Notable for the following components:      Result Value   WBC 10.7 (*)    RBC 5.16 (*)    All other components within normal limits  BASIC METABOLIC PANEL - Abnormal; Notable for the following components:   Glucose, Bld 127 (*)    All other components within normal limits    EKG None  Radiology No results found.  Procedures Procedures    Medications Ordered in ED Medications  levETIRAcetam (KEPPRA) IVPB 1000 mg/100 mL premix (has no administration in time range)    And  levETIRAcetam (KEPPRA) IVPB 500 mg/100 mL premix (has no administration in time range)    ED Course/ Medical Decision Making/ A&P    Patient seen and examined. History obtained directly from patient and family member at bedside. Work-up including labs, imaging, EKG ordered in triage, if performed, were reviewed.  I have also reviewed ED notes from previous visit in [place].  Labs/EKG: Independently reviewed and interpreted.  This included: CBC with white blood cell count mildly elevated at 10.7, normal hemoglobin otherwise unremarkable; BMP glucose 127 otherwise unremarkable.  Imaging: None ordered  Medications/Fluids: IV Keppra 1500 mg.  Most recent vital signs reviewed and are as follows: BP (!) 137/100   Pulse 83   Temp 98.4 F (36.9 C) (Oral)   Resp 20   Ht 5\' 11"  (1.803 m)   Wt 132.5 kg   LMP 06/08/2020   SpO2 95%   BMI  40.74 kg/m   Initial impression: Concern for partial seizures related to brain metastases.  Patient discussed with Dr. Earlene Plater.  4:20 PM I have spoken with Dr. Otelia Limes regarding this case.  Agrees with Keppra load.  Recommends patient be transferred for MRI of the brain and EEG.  Plan to transfer to [facility].  Dr. Anitra Lauth accepts patient for transfer.   Discussed transfer with patient and daughter and they are in agreement.  Plan [facility] transport for safety in case patient has additional seizure-like activity.  4:34  PM Discussed by text with Dr. Velta Addison. Plan admission to hospitalist service, neuro consult. Pt updated and aware.   4:56 PM Discussed with [facility] for admission.                                Medical Decision Making Amount and/or Complexity of Data Reviewed Labs: ordered. Radiology: ordered.  Risk Prescription drug management. Decision regarding hospitalization.   Patient with known brain mets from breast cancer.  Patient with symptoms concerning for partial> generalizing seizures over the weekend, and possibly 2 more partial seizure spells occurring last night and this morning.  Plan for admission for further workup.  Patient receiving Keppra.  She has stable currently without any recurrent activity.        Final Clinical Impression(s) / ED Diagnoses Final diagnoses:  Seizures [facility])    Rx / DC Orders ED Discharge Orders     None           Renne Crigler, PA-C 04/11/23 1656    Laurence Spates, MD 04/12/23 [phone]

## 2023-04-11 NOTE — ED Notes (Signed)
Pt. Up to [facility] with no difficulty ... Pt. Stands alone and has no difficulty getting out of bed

## 2023-04-11 NOTE — Consult Note (Signed)
NEUROLOGY CONSULT NOTE   Date of service: April 11, 2023 Patient Name: Sheryl Mcmillan MRN:  [id number] DOB:  [birth year] Chief Complaint: "seizures" Requesting Provider: Default, Provider, MD  History of Present Illness  Sheryl Mcmillan is a 47 y.o. female with hx of GERD, Migraines, stage 4 Breast Cancer with known brain mets s/p left frontal craniotomy and radiation in 2023 who had 3 seizrues last week while visiting famiy in [place]. She was evaluated in the ED and started on Keppra. She returned to [facility] yesterday. Last night, starring off at the ceiling and right arm shaking and  not responding. She had a similar episode this AM. She was brought in to the ED at [facility]. Episode subtle with concerns for subclinical seizures and she was brought in to [facility] for cEEG for further evaluation.  She has no recollection of these episode. She is hesitant about starting keppra. Reports that she tried to talk to the team in [place] about side effects but no one answered her questions. No prior hx of seizures.   ROS  Comprehensive ROS performed and pertinent positives documented in HPI  Past History   Past Medical History:  Diagnosis Date   Arthritis    Breast cancer (HCC)    Family history of non-Hodgkin's lymphoma 06/01/2020   GERD (gastroesophageal reflux disease)    Hemorrhoids    Migraines    PCOS (polycystic ovarian syndrome)    PONV (postoperative nausea and vomiting)     Past Surgical History:  Procedure Laterality Date   APPLICATION OF CRANIAL NAVIGATION Left 11/10/2021   Procedure: APPLICATION OF CRANIAL NAVIGATION;  Surgeon: [name], MD;  Location: [facility] OR;  Service: Neurosurgery;  Laterality: Left;   AXILLARY SENTINEL NODE BIOPSY Left 11/07/2020   Procedure: LEFT AXILLARY SENTINEL NODE BIOPSY;  Surgeon: [name], MD;  Location: [facility] OR;  Service: General;  Laterality: Left;   BREAST CYST EXCISION Right 09/22/2021   Procedure: Excision of right  axillary excess soft tissue.;  Surgeon: [name], MD;  Location: [facility];  Service: Plastics;  Laterality: Right;   BREAST RECONSTRUCTION WITH PLACEMENT OF TISSUE EXPANDER AND FLEX HD (ACELLULAR HYDRATED DERMIS) Bilateral 11/07/2020   Procedure: BILATERAL BREAST RECONSTRUCTION WITH PLACEMENT OF TISSUE EXPANDER AND FLEX HD (ACELLULAR HYDRATED DERMIS);  Surgeon: [name], MD;  Location: [facility] OR;  Service: Plastics;  Laterality: Bilateral;   COSMETIC SURGERY     CRANIOTOMY Left 11/10/2021   Procedure: LT FRONTAL CRANIOTOMY TUMOR EXCISION;  Surgeon: [name], MD;  Location: [facility] OR;  Service: Neurosurgery;  Laterality: Left;   DILATION AND CURETTAGE OF UTERUS     HEMORRHOID SURGERY     IR IMAGING GUIDED PORT INSERTION  06/15/2020   LIPOSUCTION WITH LIPOFILLING Bilateral 09/22/2021   Procedure: Fat grafting bilateral breast;  Surgeon: [name], MD;  Location: [facility];  Service: Plastics;  Laterality: Bilateral;   MASS EXCISION Bilateral 04/25/2021   Procedure: excision of bilateral axillary breast tissue;  Surgeon: [name], MD;  Location: [facility];  Service: Plastics;  Laterality: Bilateral;   PORT-A-CATH REMOVAL Right 11/07/2020   Procedure: REMOVAL PORT-A-CATH;  Surgeon: [name], MD;  Location: [facility] OR;  Service: General;  Laterality: Right;   REMOVAL OF BILATERAL TISSUE EXPANDERS WITH PLACEMENT OF BILATERAL BREAST IMPLANTS Bilateral 04/25/2021   Procedure: REMOVAL OF BILATERAL TISSUE EXPANDERS WITH PLACEMENT OF BILATERAL BREAST IMPLANTS;  Surgeon: [name], MD;  Location: [facility];  Service: Government social research officer;  Laterality: Bilateral;   ROBOTIC ASSISTED TOTAL HYSTERECTOMY WITH BILATERAL SALPINGO OOPHERECTOMY Bilateral 03/14/2021   Procedure: XI ROBOTIC ASSISTED TOTAL HYSTERECTOMY WITH BILATERAL SALPINGO OOPHORECTOMY;  Surgeon: [name], MD;  Location: [facility];  Service: Gynecology;  Laterality:  Bilateral;   TOTAL MASTECTOMY Bilateral 11/07/2020   Procedure: BILATERAL MASTECTOMY;  Surgeon: [name], MD;  Location: [facility] OR;  Service: General;  Laterality: Bilateral;    Family History: Family History  Problem Relation Age of Onset   Hypertension Mother    Diabetes Mother    Cancer Mother 60       unknown primary   Other Mother        brain tumor; dx 27s   Non-Hodgkin's lymphoma Brother 3   Other Maternal Uncle 57       brain tumor   Breast cancer Other        MGM's niece; dx late 69s   Pancreatic cancer Neg Hx    Colon cancer Neg Hx    Endometrial cancer Neg Hx    Prostate cancer Neg Hx    Ovarian cancer Neg Hx     Social History  reports that she quit smoking about 2 years ago. Her smoking use included cigarettes. She started smoking about 12 years ago. She has a 5 pack-year smoking history. She has never been exposed to tobacco smoke. She has never used smokeless tobacco. She reports that she does not currently use alcohol. She reports current drug use. Drug: Marijuana.  Allergies  Allergen Reactions   Carboplatin Hives and Itching    Carboplatin reaction after dose #5 requiring Benadryl, Solumedrol, epinephrine, Claritin.   Other Hives    Baking Soda   Wound Dressing Adhesive Hives    Rips skin, prefers paper tape    Medications  No current facility-administered medications for this encounter.  Vitals   Vitals:   04/11/23 1600 04/11/23 1600 04/11/23 1645 04/11/23 1700  BP: (!) 137/100 (!) 137/100 (!) 139/112 (!) 138/100  Pulse: 74 83 85 79  Resp: 15 20 19 13   Temp:    98.5 F (36.9 C)  TempSrc:    Oral  SpO2: 96% 95% 98% 99%  Weight:      Height:        Body mass index is 40.74 kg/m.  Physical Exam   Constitutional: Appears well-developed and well-nourished.  Psych: Affect appropriate to situation.  Eyes: No scleral injection.  HENT: No OP obstruction.  Head: Normocephalic.  Cardiovascular: Normal rate and regular rhythm.   Respiratory: Effort normal, non-labored breathing.  GI: Soft.  No distension. There is no tenderness.  Skin: WDI.   Neurologic Examination  Mental status/Cognition: Alert, oriented to self, place, month and year, good attention.  Speech/language: Fluent, comprehension intact, object naming intact, repetition intact.  Cranial nerves:   CN II Pupils equal and reactive to light, no VF deficits    CN III,IV,VI EOM intact, no gaze preference or deviation, no nystagmus    CN V normal sensation in V1, V2, and V3 segments bilaterally    CN VII no asymmetry, no nasolabial fold flattening    CN VIII normal hearing to speech    CN IX & X normal palatal elevation, no uvular deviation    CN XI 5/5 head turn and 5/5 shoulder shrug bilaterally    CN XII midline tongue protrusion    Motor:  Muscle bulk: normal, tone normal, pronator drift none tremor none Mvmt Root Nerve  Muscle Right Left Comments  SA C5/6 Ax Deltoid 5 5   EF C5/6 Mc Biceps 5 5   EE C6/7/8 Rad Triceps 5 5   WF C6/7 Med FCR     WE C7/8 PIN ECU     F Ab C8/T1 U ADM/FDI 5 5   HF L1/2/3 Fem Illopsoas 5 5   KE L2/3/4 Fem Quad 5 5   DF L4/5 D Peron Tib Ant 5 5   PF S1/2 Tibial Grc/Sol 5 5    Sensation:  Light touch Intact throughout   Pin prick    Temperature    Vibration   Proprioception    Coordination/Complex Motor:  - Finger to Nose intact BL - Heel to shin intact BL - Rapid alternating movement are normal - Gait: deferred for patient safety.   Labs   CBC:  Recent Labs  Lab 04/10/23 0950 04/11/23 1509  WBC 9.8 10.7*  NEUTROABS  --  6.8  HGB 14.5 14.6  HCT 46.1 44.2  MCV 89 85.7  PLT 284 330    Basic Metabolic Panel:  Lab Results  Component Value Date   NA 138 04/11/2023   K 4.2 04/11/2023   CO2 24 04/11/2023   GLUCOSE 127 (H) 04/11/2023   BUN 9 04/11/2023   CREATININE 0.80 04/11/2023   CALCIUM 9.1 04/11/2023   GFRNONAA >60 04/11/2023   GFRAA >60 06/06/2019   Lipid Panel:  Lab Results   Component Value Date   LDLCALC 156 (H) 04/10/2023   HgbA1c:  Lab Results  Component Value Date   HGBA1C 6.3 (H) 04/10/2023   Urine Drug Screen:     Component Value Date/Time   LABOPIA NONE DETECTED 11/07/2021 1244   COCAINSCRNUR NONE DETECTED 11/07/2021 1244   LABBENZ NONE DETECTED 11/07/2021 1244   AMPHETMU NONE DETECTED 11/07/2021 1244   THCU POSITIVE (A) 11/07/2021 1244   LABBARB NONE DETECTED 11/07/2021 1244    Alcohol Level     Component Value Date/Time   ETH <10 11/07/2021 1214   INR  Lab Results  Component Value Date   INR 1.0 11/07/2021   APTT  Lab Results  Component Value Date   APTT 26 11/07/2021   AED levels: No results found for: "PHENYTOIN", "ZONISAMIDE", "LAMOTRIGINE", "LEVETIRACETA"   MRI Brain(Personally reviewed): No new mets.  cEEG:  pending  Impression   ANTONIETTA BOHNENKAMP is a 47 y.o. 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 Episode concerning for focal seizure and she was brought in to [facility] for neurology evaluation, for MRI and for cEEG to monitor for subclinical seizures. No further episodes concerning for seizures after Keppra load. MRI brain with no new mets. Being hooked up to LTM.  Recommendations  - Keppra 750mg  BID which would be an appropriate dose for her given her weight. I discussed risks and benefits of Keppra and discussed side effects incuding behavioral side effects from Keppra. - monitor on cEEG overnight for subclinical seizures. - no driving for 6 months. Has to be seizure free before she can resume driving. - seizure precautions. - can be discharged if LTM EEG negative for seizures. - follow up with Dr. Barbaraann Cao with Neuro-oncology  outpatient. ______________________________________________________________________  Plan discussed with Dr. Cyndia Bent earlier and  with patient and her daughter in person at bedside.  Signed,  Erick Blinks

## 2023-04-11 NOTE — H&P (Signed)
History and Physical    Patient: Sheryl Mcmillan OAC:[id number] DOB: 03-14-1976 DOA: 04/11/2023 DOS: the patient was seen and examined on 04/12/2023 PCP: Rema Fendt, NP  Patient coming from: Home  Chief Complaint:  Chief Complaint  Patient presents with   Seizures   HPI: Sheryl Mcmillan is a 47 y.o. female with medical history significant of breast cancer s/p bilateral mastectomies and chemotherapy with brain metastasis in 2023 s/p left frontal metastasis resection with recurrent right frontal lobe metastasis (06/2022)s/p radiation, morbid obesity who presents as a transfer from [facility] ED with recurrent seizures.   Patient was visiting family in [place] earlier this week and had 3 new onset seizures described as focal motor seizure of the RUE followed by secondary generalization seizure and incontinence witnessed by sister. She was evaluated on 10/13 in ED and administered with Keppra load and discharged to continue Keppra 500mg  BID.Took one dose on Monday and then another dose this morning. Returned back to [place] last night and was witnessed by daughter standing next to her bed unresponsive with  right hand raised up and shaking. Had similar episode earlier today and was brought to ED. No nausea or vomiting. Denies any alcohol use but has marijuana use. This past month has been traveling more with a recent cruise and then traveling to [place]. May not have been sleeping well.   On arrival to ED, she was afebrile, BP of 137/100 on room air.  CBC with mild leukocytosis of 10.7, hemoglobin 14.6 BMP otherwise unremarkable.  Neurology was consulted and recommended transfer to [facility] for continuous EEG.    Review of Systems: As mentioned in the history of present illness. All other systems reviewed and are negative. Past Medical History:  Diagnosis Date   Arthritis    Breast cancer (HCC)    Family history of non-Hodgkin's lymphoma 06/01/2020   GERD (gastroesophageal reflux  disease)    Hemorrhoids    Migraines    PCOS (polycystic ovarian syndrome)    PONV (postoperative nausea and vomiting)    Past Surgical History:  Procedure Laterality Date   APPLICATION OF CRANIAL NAVIGATION Left 11/10/2021   Procedure: APPLICATION OF CRANIAL NAVIGATION;  Surgeon: [name], MD;  Location: [facility] OR;  Service: Neurosurgery;  Laterality: Left;   AXILLARY SENTINEL NODE BIOPSY Left 11/07/2020   Procedure: LEFT AXILLARY SENTINEL NODE BIOPSY;  Surgeon: [name], MD;  Location: [facility] OR;  Service: General;  Laterality: Left;   BREAST CYST EXCISION Right 09/22/2021   Procedure: Excision of right axillary excess soft tissue.;  Surgeon: [name], MD;  Location: [facility];  Service: Plastics;  Laterality: Right;   BREAST RECONSTRUCTION WITH PLACEMENT OF TISSUE EXPANDER AND FLEX HD (ACELLULAR HYDRATED DERMIS) Bilateral 11/07/2020   Procedure: BILATERAL BREAST RECONSTRUCTION WITH PLACEMENT OF TISSUE EXPANDER AND FLEX HD (ACELLULAR HYDRATED DERMIS);  Surgeon: [name], MD;  Location: [facility] OR;  Service: Plastics;  Laterality: Bilateral;   COSMETIC SURGERY     CRANIOTOMY Left 11/10/2021   Procedure: LT FRONTAL CRANIOTOMY TUMOR EXCISION;  Surgeon: [name], MD;  Location: [facility] OR;  Service: Neurosurgery;  Laterality: Left;   DILATION AND CURETTAGE OF UTERUS     HEMORRHOID SURGERY     IR IMAGING GUIDED PORT INSERTION  06/15/2020   LIPOSUCTION WITH LIPOFILLING Bilateral 09/22/2021   Procedure: Fat grafting bilateral breast;  Surgeon: [name], MD;  Location: [facility];  Service: Plastics;  Laterality: Bilateral;   MASS EXCISION Bilateral  04/25/2021   Procedure: excision of bilateral axillary breast tissue;  Surgeon: [name], MD;  Location: [facility];  Service: Plastics;  Laterality: Bilateral;   PORT-A-CATH REMOVAL Right 11/07/2020   Procedure: REMOVAL PORT-A-CATH;  Surgeon: [name], MD;   Location: [facility] OR;  Service: General;  Laterality: Right;   REMOVAL OF BILATERAL TISSUE EXPANDERS WITH PLACEMENT OF BILATERAL BREAST IMPLANTS Bilateral 04/25/2021   Procedure: REMOVAL OF BILATERAL TISSUE EXPANDERS WITH PLACEMENT OF BILATERAL BREAST IMPLANTS;  Surgeon: [name], MD;  Location: [facility];  Service: Plastics;  Laterality: Bilateral;   ROBOTIC ASSISTED TOTAL HYSTERECTOMY WITH BILATERAL SALPINGO OOPHERECTOMY Bilateral 03/14/2021   Procedure: XI ROBOTIC ASSISTED TOTAL HYSTERECTOMY WITH BILATERAL SALPINGO OOPHORECTOMY;  Surgeon: [name], MD;  Location: [facility];  Service: Gynecology;  Laterality: Bilateral;   TOTAL MASTECTOMY Bilateral 11/07/2020   Procedure: BILATERAL MASTECTOMY;  Surgeon: [name], MD;  Location: [facility] OR;  Service: General;  Laterality: Bilateral;   Social History:  reports that she quit smoking about 2 years ago. Her smoking use included cigarettes. She started smoking about 12 years ago. She has a 5 pack-year smoking history. She has never been exposed to tobacco smoke. She has never used smokeless tobacco. She reports that she does not currently use alcohol. She reports current drug use. Drug: Marijuana.  Allergies  Allergen Reactions   Carboplatin Hives and Itching    Carboplatin reaction after dose #5 requiring Benadryl, Solumedrol, epinephrine, Claritin.   Other Hives    Baking Soda   Wound Dressing Adhesive Hives    Rips skin, prefers paper tape    Family History  Problem Relation Age of Onset   Hypertension Mother    Diabetes Mother    Cancer Mother 30       unknown primary   Other Mother        brain tumor; dx 33s   Non-Hodgkin's lymphoma Brother 61   Other Maternal Uncle 21       brain tumor   Breast cancer Other        MGM's niece; dx late 61s   Pancreatic cancer Neg Hx    Colon cancer Neg Hx    Endometrial cancer Neg Hx    Prostate cancer Neg Hx    Ovarian cancer Neg Hx     Prior to Admission medications    Medication Sig Start Date End Date Taking? Authorizing Provider  atorvastatin (LIPITOR) 20 MG tablet Take 1 tablet (20 mg total) by mouth daily. Patient not taking: Reported on 04/11/2023 04/11/23   Rema Fendt, NP  cyclobenzaprine (FLEXERIL) 5 MG tablet Take 1 tablet (5 mg total) by mouth at bedtime as needed for muscle spasms. Patient not taking: Reported on 04/11/2023 04/01/23   Rema Fendt, NP  LORazepam (ATIVAN) 1 MG tablet Take one tablet by mouth  30 min prior to mri or radiation Patient not taking: Reported on 04/11/2023 01/16/23   Ronny Bacon, PA-C  omeprazole (PRILOSEC) 40 MG capsule Take 1 capsule (40 mg total) by mouth daily. Patient not taking: Reported on 04/11/2023 04/01/23 06/30/23  Rema Fendt, NP    Physical Exam: Vitals:   04/11/23 1700 04/11/23 2000 04/11/23 2001 04/12/23 0000  BP: (!) 138/100 128/74    Pulse: 79  75 81  Resp: 13 (!) 23 18 15   Temp: 98.5 F (36.9 C)     TempSrc: Oral     SpO2: 99%  98% 94%  Weight:  Height:       Constitutional: NAD, irritable morbidly obese female lying in bed having EEG electrodes placed Eyes: lids and conjunctivae normal ENMT: Mucous membranes are moist.  Neck: normal, supple Respiratory: clear to auscultation bilaterally, no wheezing, no crackles. Normal respiratory effort. No accessory muscle use.  Cardiovascular: Regular rate and rhythm, no murmurs / rubs / gallops. No extremity edema.  Abdomen: no tenderness, soft, non-distended Musculoskeletal: no clubbing / cyanosis. No joint deformity upper and lower extremities.  Normal muscle tone.  Skin: no rashes, lesions, ulcers. No induration Neurologic: CN 2-12 grossly intact.  Strength 5/5 in all 4. Alert and oriented to self, place, time and family at bedside Psychiatric: Normal judgment and insight.  Irritable mood and upset at EEG electrodes being placed.   Data Reviewed:  See HPI  Assessment and Plan: * Seizure (HCC) -MRI brain with and  without contrast with unchanged appearance of left frontal parietal resection site.  No new areas of parenchymal or meningeal enhancement. -Seizures threshold could be lowered by marijuana use and recent decrease in sleep with frequent traveling -will obtain UDS. -TSH obtain a few days ago was within normal limits -has been loaded on Keppra 1500mg  -currently on video EEG  -Neurology following. Appreciate recommendations of maintenance Keppra dosing   Leukocytosis -likely reactive with seizure activity. No signs of infection  Obesity, Class III, BMI 40-49.9 (morbid obesity) (HCC) -BMI of 40  Malignant neoplasm of upper-outer quadrant of left breast in female, estrogen receptor positive (HCC) -breast cancer s/p bilateral mastectomies and chemotherapy with brain metastasis in 2023 s/p left frontal metastasis resection with recurrent right frontal lobe metastasis (06/2022)s/p radiation. Now under surveillance.       Advance Care Planning: Full  Consults: neurology  Family Communication: daughter at bedside  Severity of Illness: The appropriate patient status for this patient is INPATIENT. Inpatient status is judged to be reasonable and necessary in order to provide the required intensity of service to ensure the patient's safety. The patient's presenting symptoms, physical exam findings, and initial radiographic and laboratory data in the context of their chronic comorbidities is felt to place them at high risk for further clinical deterioration. Furthermore, it is not anticipated that the patient will be medically stable for discharge from the hospital within 2 midnights of admission.   * I certify that at the point of admission it is my clinical judgment that the patient will require inpatient hospital care spanning beyond 2 midnights from the point of admission due to high intensity of service, high risk for further deterioration and high frequency of surveillance  required.*  Author: Anselm Jungling, DO 04/12/2023 1:42 AM  For on call review [email or web address].

## 2023-04-11 NOTE — Progress Notes (Signed)
Call received from -ED attending at [facility] Request is for  -inpatient admission and neurology evaluation   Information received from ED attending as below:   Sheryl Mcmillan is a 47 y.o. female with PMH significant for breast cancer and brain mets s/p radiation last year.   She was visiting her family at [place] last week and when she had 3 episodes of witnessed seizure.  She was seen in local ED, loaded with Keppra and was discharged to continue Keppra.  She returned back to [place] last night.  This morning family noticed odd behavior where she was staring at the ceiling and not able to respond. Patient was brought to ED admitted to [facility]  EDP discussed with neurologist Dr. Wilford Corner Recommended inpatient monitoring, EEG, MRI brain Keppra loaded in ED  Accepted to neuro unit Please notify neurology once patient arrives.

## 2023-04-11 NOTE — Progress Notes (Signed)
Patient not available for EEG lead placement at the moment. Tech will check back for return from imaging.

## 2023-04-11 NOTE — ED Notes (Signed)
Per Pt. Daughter the pt. Has changed with her behavior and started to act different since home on Tuesday.  Pt. Is walking slower and talking slower and is delayed  with everything.  She stares out in front of her.  Pt. Has history of brain tumor.  Stage 4 breast Cancer.

## 2023-04-12 ENCOUNTER — Inpatient Hospital Stay (HOSPITAL_COMMUNITY): Payer: Medicaid Other

## 2023-04-12 ENCOUNTER — Telehealth: Payer: Self-pay | Admitting: Internal Medicine

## 2023-04-12 DIAGNOSIS — D72829 Elevated white blood cell count, unspecified: Secondary | ICD-10-CM

## 2023-04-12 DIAGNOSIS — R569 Unspecified convulsions: Secondary | ICD-10-CM | POA: Diagnosis not present

## 2023-04-12 DIAGNOSIS — Z170 Estrogen receptor positive status [ER+]: Secondary | ICD-10-CM | POA: Diagnosis not present

## 2023-04-12 DIAGNOSIS — C50412 Malignant neoplasm of upper-outer quadrant of left female breast: Secondary | ICD-10-CM | POA: Diagnosis not present

## 2023-04-12 MED ORDER — LEVETIRACETAM 250 MG PO TABS
250.0000 mg | ORAL_TABLET | Freq: Once | ORAL | Status: AC
  Administered 2023-04-12: 250 mg via ORAL
  Filled 2023-04-12: qty 1

## 2023-04-12 MED ORDER — LEVETIRACETAM 500 MG PO TABS
750.0000 mg | ORAL_TABLET | Freq: Two times a day (BID) | ORAL | Status: DC
  Administered 2023-04-12: 750 mg via ORAL
  Filled 2023-04-12: qty 1

## 2023-04-12 MED ORDER — LEVETIRACETAM IN NACL 500 MG/100ML IV SOLN: 500.0000 mg | Freq: Two times a day (BID) | INTRAVENOUS | Status: DC

## 2023-04-12 MED ORDER — LEVETIRACETAM 500 MG PO TABS
1000.0000 mg | ORAL_TABLET | Freq: Two times a day (BID) | ORAL | Status: DC
  Administered 2023-04-12 – 2023-04-13 (×2): 1000 mg via ORAL
  Filled 2023-04-12 (×2): qty 2

## 2023-04-12 MED ORDER — DIPHENHYDRAMINE HCL 50 MG/ML IJ SOLN
25.0000 mg | Freq: Once | INTRAMUSCULAR | Status: AC
  Administered 2023-04-12: 25 mg via INTRAVENOUS
  Filled 2023-04-12: qty 1

## 2023-04-12 NOTE — Assessment & Plan Note (Addendum)
-  MRI brain with and without contrast with unchanged appearance of left frontal parietal resection site.  No new areas of parenchymal or meningeal enhancement. -Seizures threshold could be lowered by marijuana use and recent decrease in sleep with frequent traveling -will obtain UDS. -TSH obtain a few days ago was within normal limits -has been loaded on Keppra 1500mg  -currently on video EEG  -Neurology following. Appreciate recommendations of maintenance Keppra dosing

## 2023-04-12 NOTE — Progress Notes (Signed)
PROGRESS NOTE        PATIENT DETAILS Name: Sheryl Mcmillan Age: 47 y.o. Sex: female Date of Birth: Jun 24, 1976 Admit Date: 04/11/2023 Admitting Physician Lorin Glass, MD [id number], Salomon Fick, NP  Brief Summary: Patient is a 47 y.o.  female with history of metastatic breast cancer-with known brain mets-s/p left frontal metastatic resection 2023 subsequent radiation therapy completed January 2024-who had new onset seizures last week while visiting family in [place]-she was started on Keppra-presented to the hospital on 10/17 with recurrent seizure activity.  Significant events: 10/17>> admit to [facility]  Significant studies: 10/17>> MRI brain: Unchanged appearance of left frontoparietal resection site.  Significant microbiology data: None  Procedures: None  Consults: Neurology  Subjective: Lying comfortably in bed-denies any chest pain or shortness of breath.  Patient no family has noted any seizures overnight.  Objective: Vitals: Blood pressure (!) 129/97, pulse 80, temperature 99.2 F (37.3 C), temperature source Oral, resp. rate 15, height 5\' 11"  (1.803 m), weight 132.5 kg, last menstrual period 06/08/2020, SpO2 98%.   Exam: Gen Exam:Alert awake-not in any distress HEENT:atraumatic, normocephalic Chest: B/L clear to auscultation anteriorly CVS:S1S2 regular Abdomen:soft non tender, non distended Extremities:no edema Neurology: Non focal Skin: no rash  Pertinent Labs/Radiology:    Latest Ref Rng & Units 04/11/2023    3:09 PM 04/10/2023    9:50 AM 12/08/2021    5:06 AM  CBC  WBC 4.0 - 10.5 K/uL 10.7  9.8  7.2   Hemoglobin 12.0 - 15.0 g/dL 34.7  42.5  95.6   Hematocrit 36.0 - 46.0 % 44.2  46.1  40.0   Platelets 150 - 400 K/uL 330  284  162     Lab Results  Component Value Date   NA 138 04/11/2023   K 4.2 04/11/2023   CL 100 04/11/2023   CO2 24 04/11/2023      Assessment/Plan: New onset seizures Continue Keppra LTM EEG in  progress No seizures overnight per family Await recommendations from neurology  Patient aware of driving restrictions  History of breast cancer with brain mets s/p left frontal metastatic resection 2023 followed by radiation therapy completed in January 2024 Currently under surveillance Follows with Dr. Pamelia Hoit  Morbid Obesity: Estimated body mass index is 40.74 kg/m as calculated from the following:   Height as of this encounter: 5\' 11"  (1.803 m).   Weight as of this encounter: 132.5 kg.   Code status:   Code Status: Full Code   DVT Prophylaxis: SCDs Start: 04/11/23 2125   Family Communication: Daughter at bedside   Disposition Plan: Status is: Inpatient Remains inpatient appropriate because: Severity of illness   Planned Discharge Destination:Home   Diet: Diet Order             Diet Heart Room service appropriate? Yes; Fluid consistency: Thin  Diet effective now                     Antimicrobial agents: Anti-infectives (From admission, onward)    None        MEDICATIONS: Scheduled Meds:  levETIRAcetam  750 mg Oral BID   Continuous Infusions: PRN Meds:.   I have personally reviewed following labs and imaging studies  LABORATORY DATA: CBC: Recent Labs  Lab 04/10/23 0950 04/11/23 1509  WBC 9.8 10.7*  NEUTROABS  --  6.8  HGB 14.5 14.6  HCT 46.1 44.2  MCV 89 85.7  PLT 284 330    Basic Metabolic Panel: Recent Labs  Lab 04/10/23 0950 04/11/23 1509  NA 145* 138  K 4.8 4.2  CL 107* 100  CO2 20 24  GLUCOSE 107* 127*  BUN 10 9  CREATININE 0.65 0.80  CALCIUM 9.4 9.1    GFR: Estimated Creatinine Clearance: 132.5 mL/min (by C-G formula based on SCr of 0.8 mg/dL).  Liver Function Tests: Recent Labs  Lab 04/10/23 0950  AST 23  ALT 29  ALKPHOS 126*  BILITOT 0.2  PROT 6.5  ALBUMIN 4.2   No results for input(s): "LIPASE", "AMYLASE" in the last 168 hours. No results for input(s): "AMMONIA" in the last 168 hours.  Coagulation  Profile: No results for input(s): "INR", "PROTIME" in the last 168 hours.  Cardiac Enzymes: No results for input(s): "CKTOTAL", "CKMB", "CKMBINDEX", "TROPONINI" in the last 168 hours.  BNP (last 3 results) No results for input(s): "PROBNP" in the last 8760 hours.  Lipid Profile: Recent Labs    04/10/23 0950  CHOL 226*  HDL 45  LDLCALC 156*  TRIG 138  CHOLHDL 5.0*    Thyroid Function Tests: Recent Labs    04/10/23 0950  TSH 2.140    Anemia Panel: No results for input(s): "VITAMINB12", "FOLATE", "FERRITIN", "TIBC", "IRON", "RETICCTPCT" in the last 72 hours.  Urine analysis:    Component Value Date/Time   COLORURINE YELLOW 11/07/2021 1244   APPEARANCEUR CLEAR 11/07/2021 1244   LABSPEC 1.015 11/07/2021 1244   PHURINE 6.0 11/07/2021 1244   GLUCOSEU NEGATIVE 11/07/2021 1244   HGBUR NEGATIVE 11/07/2021 1244   BILIRUBINUR NEGATIVE 11/07/2021 1244   KETONESUR NEGATIVE 11/07/2021 1244   PROTEINUR NEGATIVE 11/07/2021 1244   UROBILINOGEN 1.0 05/09/2011 1805   NITRITE NEGATIVE 11/07/2021 1244   LEUKOCYTESUR NEGATIVE 11/07/2021 1244    Sepsis Labs: Lactic Acid, Venous No results found for: "LATICACIDVEN"  MICROBIOLOGY: No results found for this or any previous visit (from the past 240 hour(s)).  RADIOLOGY STUDIES/RESULTS: MR Brain W and Wo Contrast  Result Date: 04/11/2023 CLINICAL DATA:  Brain metastases, assess treatment response EXAM: MRI HEAD WITHOUT AND WITH CONTRAST TECHNIQUE: Multiplanar, multiecho pulse sequences of the brain and surrounding structures were obtained without and with intravenous contrast. CONTRAST:  10mL GADAVIST GADOBUTROL 1 MMOL/ML IV SOLN COMPARISON:  01/16/2023 FINDINGS: Brain: Redemonstrated wispy parasagittal enhancement at the left frontoparietal junction at the resection site, which appears unchanged compared to the prior exam. No new areas of parenchymal or meningeal enhancement. No restricted diffusion to suggest acute or subacute infarct.  No acute hemorrhage, mass, mass effect, or midline shift. No hydrocephalus or extra-axial collection. Normal pituitary and craniocervical junction. Vascular: Normal arterial flow voids. Normal arterial and venous enhancement. Skull and upper cervical spine: Vertex craniotomy. Otherwise normal marrow signal. Sinuses/Orbits: Mucous retention cysts in the left sphenoid sinus and left maxillary sinus. No acute finding in the orbits. Other: The mastoid air cells are well aerated. IMPRESSION: Unchanged appearance of the left frontoparietal resection site. No new areas of parenchymal or meningeal enhancement. Electronically Signed   By: Wiliam Ke M.D.   On: 04/11/2023 22:36     LOS: 1 day   Jeoffrey Massed, MD  [facility]    To contact the attending provider between 7A-7P or the covering provider during after hours 7P-7A, please log into the web site [email or web address] and access using universal [place] password for that web site. If you do not have the password, please call  the hospital operator.  04/12/2023, 9:24 AM

## 2023-04-12 NOTE — Progress Notes (Signed)
Neurology Progress Note   S:// Seen and examined Overnight EEG with lateralized periodic discharges with overlying rhythmicity from the left hemisphere maximal in the left frontoparietal region where the breach artifact is with a high risk of seizures given that this pattern is on ictal interictal continuum.  Cortical dysfunction in the left frontoparietal region consistent with underlying craniotomy.   O:// Current vital signs: BP (!) 129/97 (BP Location: Right Wrist)   Pulse 80   Temp 99.2 F (37.3 C) (Oral)   Resp 15   Ht 5\' 11"  (1.803 m)   Wt 132.5 kg   LMP 06/08/2020   SpO2 98%   BMI 40.74 kg/m  Vital signs in last 24 hours: Temp:  [98.4 F (36.9 C)-99.2 F (37.3 C)] 99.2 F (37.3 C) (10/18 0740) Pulse Rate:  [74-92] 80 (10/18 0740) Resp:  [13-23] 15 (10/18 0740) BP: (128-139)/(74-112) 129/97 (10/18 0740) SpO2:  [94 %-99 %] 98 % (10/18 0740) Weight:  [132.5 kg] 132.5 kg (10/17 1312) General: She is awake alert in no distress HEENT: Normocephalic, atraumatic Lungs: Breathing well and saturating normally on room air Cardiovascular: Regular rate and rhythm on the monitor Neurological exam She is awake alert oriented x 3 Mildly encephalopathic-poor attention concentration Mild dysarthria No aphasia Cranial nerves II to XII intact Motor examination with no drift in any of the 4 extremities Sensation intact light touch Coordination examination with no gross dysmetria   Medications  Current Facility-Administered Medications:    levETIRAcetam (KEPPRA) tablet 1,000 mg, 1,000 mg, Oral, BID, Charlsie Quest, MD  Labs CBC    Component Value Date/Time   WBC 10.7 (H) 04/11/2023 1509   RBC 5.16 (H) 04/11/2023 1509   HGB 14.6 04/11/2023 1509   HGB 14.5 04/10/2023 0950   HCT 44.2 04/11/2023 1509   HCT 46.1 04/10/2023 0950   PLT 330 04/11/2023 1509   PLT 284 04/10/2023 0950   MCV 85.7 04/11/2023 1509   MCV 89 04/10/2023 0950   MCH 28.3 04/11/2023 1509   MCHC  33.0 04/11/2023 1509   RDW 14.4 04/11/2023 1509   RDW 14.3 04/10/2023 0950   LYMPHSABS 3.3 04/11/2023 1509   MONOABS 0.5 04/11/2023 1509   EOSABS 0.1 04/11/2023 1509   BASOSABS 0.1 04/11/2023 1509    CMP     Component Value Date/Time   NA 138 04/11/2023 1509   NA 145 (H) 04/10/2023 0950   K 4.2 04/11/2023 1509   CL 100 04/11/2023 1509   CO2 24 04/11/2023 1509   GLUCOSE 127 (H) 04/11/2023 1509   BUN 9 04/11/2023 1509   BUN 10 04/10/2023 0950   CREATININE 0.80 04/11/2023 1509   CREATININE 0.63 07/05/2022 1158   CALCIUM 9.1 04/11/2023 1509   PROT 6.5 04/10/2023 0950   ALBUMIN 4.2 04/10/2023 0950   AST 23 04/10/2023 0950   AST 16 10/17/2021 1112   ALT 29 04/10/2023 0950   ALT 21 10/17/2021 1112   ALKPHOS 126 (H) 04/10/2023 0950   BILITOT 0.2 04/10/2023 0950   BILITOT 0.3 10/17/2021 1112   GFRNONAA >60 04/11/2023 1509   GFRNONAA >60 07/05/2022 1158   GFRAA >60 06/06/2019 1205    Lipid Panel     Component Value Date/Time   CHOL 226 (H) 04/10/2023 0950   TRIG 138 04/10/2023 0950   HDL 45 04/10/2023 0950   CHOLHDL 5.0 (H) 04/10/2023 0950   LDLCALC 156 (H) 04/10/2023 0950    Lab Results  Component Value Date   HGBA1C 6.3 (H) 04/10/2023  Imaging I have reviewed images in epic and the results pertinent to this consultation are: MRI brain with no new mets  Assessment: 47 year old with history of stage IV breast cancer with known brain mets status post left frontal craniotomy and radiation in 2023, had GTC while visiting family in [place] on Saturday and then had 2 subtle episodes with right arm up in the air and staring off for 20 seconds with quick return to baseline-episode concerning for focal seizure.  Brought into [facility] for formal evaluation No further episodes since arrival here but EEG shows left-sided lateralized periodic discharges with overlying rhythmicity-LPD + R pattern which is high risk for seizure recurrence.  Keppra dose was increased  overnight.  Remains seizure-free and slowly improving. If remains seizure-free for the next 24 hours, will discontinue LTM and discharge  Impression: Breakthrough seizure  Recommendations: Continue Keppra 1 g twice daily Continue LTM EEG Maintain seizure precautions Neurology will follow Plan discussed with Dr. Jerral Ralph  -- Milon Dikes, MD Neurologist [facility] Pager: [phone]

## 2023-04-12 NOTE — Progress Notes (Signed)
TIR to fix P8 and T4

## 2023-04-12 NOTE — Progress Notes (Signed)
LTM EEG hooked up and running - no initial skin breakdown - push button tested - [facility] monitoring.  

## 2023-04-12 NOTE — Plan of Care (Signed)
Pt has rested quietly throughout the night with no distress noted. Alert and oriented. On room air. SR on the monitor. Up to [facility] to void. Pt had MRI done. Overnight EEG in progress. Daughter at bedside. Benadryl given per order with relief noted. No other complaints voiced.     Problem: Education: Goal: Understanding of post-operative needs will improve Outcome: Progressing Goal: Individualized Educational Video(s) Outcome: Progressing   Problem: Clinical Measurements: Goal: Postoperative complications will be avoided or minimized Outcome: Progressing   Problem: Respiratory: Goal: Will regain and/or maintain adequate ventilation Outcome: Progressing   Problem: Education: Goal: Knowledge of General Education information will improve Description: Including pain rating scale, medication(s)/side effects and non-pharmacologic comfort measures Outcome: Progressing   Problem: Health Behavior/Discharge Planning: Goal: Ability to manage health-related needs will improve Outcome: Progressing   Problem: Clinical Measurements: Goal: Ability to maintain clinical measurements within normal limits will improve Outcome: Progressing Goal: Will remain free from infection Outcome: Progressing Goal: Diagnostic test results will improve Outcome: Progressing Goal: Respiratory complications will improve Outcome: Progressing Goal: Cardiovascular complication will be avoided Outcome: Progressing   Problem: Activity: Goal: Risk for activity intolerance will decrease Outcome: Progressing   Problem: Nutrition: Goal: Adequate nutrition will be maintained Outcome: Progressing   Problem: Coping: Goal: Level of anxiety will decrease Outcome: Progressing   Problem: Elimination: Goal: Will not experience complications related to bowel motility Outcome: Progressing Goal: Will not experience complications related to urinary retention Outcome: Progressing   Problem: Pain Managment: Goal: General  experience of comfort will improve Outcome: Progressing   Problem: Safety: Goal: Ability to remain free from injury will improve Outcome: Progressing   Problem: Skin Integrity: Goal: Risk for impaired skin integrity will decrease Outcome: Progressing

## 2023-04-12 NOTE — Assessment & Plan Note (Signed)
BMI of 40 

## 2023-04-12 NOTE — Progress Notes (Signed)
   04/12/23 1403  TOC Brief Assessment  Insurance and Status Reviewed ([place] [employer])  Patient has primary care physician Yes (Do Not Disturb  Rema Fendt, NP)  Home environment has been reviewed from home with Family  Prior level of function: independent  Prior/Current Home Services No current home services  Social Determinants of Health Reivew SDOH reviewed no interventions necessary  Readmission risk has been reviewed Yes (9%)  Transition of care needs no transition of care needs at this time   Recent breakthrough seizure  Do not anticipate any TOC needs  TOC will continue to follow patient for any additional discharge needs

## 2023-04-12 NOTE — Progress Notes (Addendum)
LTM maint complete - no skin breakdown under: Fp1 Fp2 F4 Serviced leads P8 F8 Cz Fp1 [facility] monitored, Event button test confirmed by [facility].

## 2023-04-12 NOTE — Assessment & Plan Note (Signed)
-  likely reactive with seizure activity. No signs of infection

## 2023-04-12 NOTE — Procedures (Signed)
Patient Name: Sheryl Mcmillan  MRN: [id number]  Epilepsy Attending: Charlsie Quest  Referring Physician/Provider: Erick Blinks, MD  Duration: 04/12/2023 0211 to 04/13/2023 0211  Patient history: 47 y.o. 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 EEG to evaluate for seizure  Level of alertness: Awake, asleep  AEDs during EEG study: LEV  Technical aspects: This EEG study was done with scalp electrodes positioned according to the 10-20 International system of electrode placement. Electrical activity was reviewed with band pass filter of 1-70Hz , sensitivity of 7 uV/mm, display speed of 72mm/sec with a 60Hz  notched filter applied as appropriate. EEG data were recorded continuously and digitally stored.  Video monitoring was available and reviewed as appropriate.  Description: The posterior dominant rhythm consists of 9-10 Hz activity of moderate voltage (25-35 uV) seen predominantly in posterior head regions, symmetric and reactive to eye opening and eye closing. Sleep was characterized by vertex waves, sleep spindles (12 to 14 Hz), maximal frontocentral region.  EEG showed continuous 3 to 6 Hz 11 delta slowing with sharply contoured 12 to 15 Hz beta activity in left fronto parietal region consistent with underlying breach artifact.  Hyperventilation and photic stimulation were not performed.     On 04/02/2023 after around 0615, EEG showed rhythmic 2 to 3 Hz delta slowing admixed with lateralized periodic discharges at 1 Hz in left hemisphere, maximal left fronto-parietal region with waxing and waning morphology lasting about 30 seconds each time. This pattern was predominantly noted during sleep and not seen during awake  state.  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  This EEG pattern is on the ictal-interictal continuum with increased risk of seizures.  Additionally there is cortical dysfunction in left frontoparietal region consistent with underlying craniotomy.   Hamda Klutts Annabelle Harman

## 2023-04-12 NOTE — Assessment & Plan Note (Signed)
-  breast cancer s/p bilateral mastectomies and chemotherapy with brain metastasis in 2023 s/p left frontal metastasis resection with recurrent right frontal lobe metastasis (06/2022)s/p radiation. Now under surveillance.

## 2023-04-13 ENCOUNTER — Other Ambulatory Visit (HOSPITAL_COMMUNITY): Payer: Self-pay

## 2023-04-13 DIAGNOSIS — Z170 Estrogen receptor positive status [ER+]: Secondary | ICD-10-CM | POA: Diagnosis not present

## 2023-04-13 DIAGNOSIS — R569 Unspecified convulsions: Secondary | ICD-10-CM | POA: Diagnosis not present

## 2023-04-13 DIAGNOSIS — C50412 Malignant neoplasm of upper-outer quadrant of left female breast: Secondary | ICD-10-CM | POA: Diagnosis not present

## 2023-04-13 MED ORDER — ALBUTEROL SULFATE (2.5 MG/3ML) 0.083% IN NEBU: 2.5000 mg | INHALATION_SOLUTION | RESPIRATORY_TRACT | Status: DC | PRN

## 2023-04-13 MED ORDER — LEVETIRACETAM 1000 MG PO TABS
1000.0000 mg | ORAL_TABLET | Freq: Two times a day (BID) | ORAL | 2 refills | Status: DC
  Filled 2023-04-13: qty 60, 30d supply, fill #0

## 2023-04-13 MED ORDER — ONDANSETRON HCL 4 MG/2ML IJ SOLN: 4.0000 mg | Freq: Four times a day (QID) | INTRAMUSCULAR | Status: DC | PRN

## 2023-04-13 MED ORDER — ACETAMINOPHEN 325 MG PO TABS: 650.0000 mg | ORAL_TABLET | Freq: Four times a day (QID) | ORAL | Status: DC | PRN

## 2023-04-13 NOTE — Procedures (Addendum)
Patient Name: Sheryl Mcmillan  MRN: [id number]  Epilepsy Attending: Charlsie Quest  Referring Physician/Provider: Erick Blinks, MD  Duration: 04/13/2023 0211 to 04/13/2023 0850   Patient history: 47 y.o. 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 EEG to evaluate for seizure   Level of alertness: Awake, asleep   AEDs during EEG study: LEV   Technical aspects: This EEG study was done with scalp electrodes positioned according to the 10-20 International system of electrode placement. Electrical activity was reviewed with band pass filter of 1-70Hz , sensitivity of 7 uV/mm, display speed of 25mm/sec with a 60Hz  notched filter applied as appropriate. EEG data were recorded continuously and digitally stored.  Video monitoring was available and reviewed as appropriate.   Description: The posterior dominant rhythm consists of 9-10 Hz activity of moderate voltage (25-35 uV) seen predominantly in posterior head regions, symmetric and reactive to eye opening and eye closing. Sleep was characterized by vertex waves, sleep spindles (12 to 14 Hz), maximal frontocentral region.  EEG showed continuous 3 to 6 Hz 11 delta slowing with sharply contoured 12 to 15 Hz beta activity in left fronto parietal region consistent with underlying breach artifact. Lateralized periodic discharges at 1 Hz in left hemisphere, maximal left fronto-parietal region, predominantly during sleep. Hyperventilation and photic stimulation were not performed.      ABNORMALITY -Lateralized periodic discharges, left hemisphere, maximal left fronto- parietal region ( LPD) -Breach artifact, left fronto- parietal region   IMPRESSION: This study showed evidence of  epileptogenicity and cortical dysfunction arising from left hemisphere, maximal left frontoparietal region secondary to underlying craniotomy. No definite seizures were noted.     Brandonlee Navis Annabelle Harman

## 2023-04-13 NOTE — Discharge Summary (Signed)
PATIENT DETAILS Name: Sheryl Mcmillan Age: 47 y.o. Sex: female Date of Birth: 16-Dec-1975 MRN: [id number]. Admitting Physician: Lorin Glass, MD LOV:FIEPPIRJ, Salomon Fick, NP  Admit Date: 04/11/2023 Discharge date: 04/13/2023  Recommendations for Outpatient Follow-up:  Follow up with PCP in 1-2 weeks Please obtain CMP/CBC in one week Please ensure follow-up with neuro-oncology/oncology/radiation oncology   Admitted From:  Home  Disposition: Home   Discharge Condition: good  CODE STATUS:   Code Status: Full Code   Diet recommendation:  Diet Order             Diet - low sodium heart healthy           Diet Heart Room service appropriate? Yes; Fluid consistency: Thin  Diet effective now                    Brief Summary: Patient is a 47 y.o.  female with history of metastatic breast cancer-with known brain mets-s/p left frontal metastatic resection 2023 subsequent radiation therapy completed January 2024-who had new onset seizures last week while visiting family in [place]-she was started on Keppra-presented to the hospital on 10/17 with recurrent seizure activity.   Significant events: 10/17>> admit to [facility]   Significant studies: 10/17>> MRI brain: Unchanged appearance of left frontoparietal resection site.   Significant microbiology data: None   Procedures: None   Consults: Neurology  Brief Hospital Course: New onset seizures Admitted and underwent LTM EEG-confirm seizures-Keppra gradually increased to 1000 mg twice daily. Discussed with neurology 10/19-okay to discharge Follow-up with Dr. Barbaraann Cao Patient/daughter aware of driving restrictions and other restrictions-see below.   History of breast cancer with brain mets s/p left frontal metastatic resection 2023 followed by radiation therapy completed in January 2024 Currently under surveillance Follows with Dr. Pamelia Hoit   Morbid Obesity: Estimated body mass index is 40.74 kg/m as calculated from the  following:   Height as of this encounter: 5\' 11"  (1.803 m).   Weight as of this encounter: 132.5 kg.   Discharge Diagnoses:  Principal Problem:   Seizure [facility]) Active Problems:   Malignant neoplasm of upper-outer quadrant of left breast in female, estrogen receptor positive (HCC)   Obesity, Class III, BMI 40-49.9 (morbid obesity) (HCC)   Leukocytosis   Discharge Instructions:  Activity:  As tolerated    Discharge Instructions     Call MD for:   Complete by: As directed    Recurrent seizures   Call MD for:  difficulty breathing, headache or visual disturbances   Complete by: As directed    Call MD for:  extreme fatigue   Complete by: As directed    Diet - low sodium heart healthy   Complete by: As directed    Discharge instructions   Complete by: As directed    Follow with Primary MD  Rema Fendt, NP in 1-2 weeks  Please get a complete blood count and chemistry panel checked by your Primary MD at your next visit, and again as instructed by your Primary MD.  Get Medicines reviewed and adjusted: Please take all your medications with you for your next visit with your Primary MD  Laboratory/radiological data: Please request your Primary MD to go over all hospital tests and procedure/radiological results at the follow up, please ask your Primary MD to get all Hospital records sent to his/her office.  In some cases, they will be blood work, cultures and biopsy results pending at the time of your discharge. Please request that your primary  care M.D. follows up on these results.  Also Note the following: If you experience worsening of your admission symptoms, develop shortness of breath, life threatening emergency, suicidal or homicidal thoughts you must seek medical attention immediately by calling 911 or calling your MD immediately  if symptoms less severe.  You must read complete instructions/literature along with all the possible adverse reactions/side effects for all  the Medicines you take and that have been prescribed to you. Take any new Medicines after you have completely understood and accpet all the possible adverse reactions/side effects.   Do not drive when taking Pain medications or sleeping medications (Benzodaizepines)  Do not take more than prescribed Pain, Sleep and Anxiety Medications. It is not advisable to combine anxiety,sleep and pain medications without talking with your primary care practitioner  Special Instructions: If you have smoked or chewed Tobacco  in the last 2 yrs please stop smoking, stop any regular Alcohol  and or any Recreational drug use.  Wear Seat belts while driving.  Please note: You were cared for by a hospitalist during your hospital stay. Once you are discharged, your primary care physician will handle any further medical issues. Please note that NO REFILLS for any discharge medications will be authorized once you are discharged, as it is imperative that you return to your primary care physician (or establish a relationship with a primary care physician if you do not have one) for your post hospital discharge needs so that they can reassess your need for medications and monitor your lab values.     Seizure precautions: Per [facility] statutes, patients with seizures are not allowed to drive until they have been seizure-free for six months and cleared by a physician    Use caution when using heavy equipment or power tools. Avoid working on ladders or at heights. Take showers instead of baths. Ensure the water temperature is not too high on the home water heater. Do not go swimming alone. Do not lock yourself in a room alone (i.e. bathroom). When caring for infants or small children, sit down when holding, feeding, or changing them to minimize risk of injury to the child in the event you have a seizure. Maintain good sleep hygiene. Avoid alcohol.    If patient has another seizure, call 911 and bring them back to the  ED if: A.  The seizure lasts longer than 5 minutes.      B.  The patient doesn't wake shortly after the seizure or has new problems such as difficulty seeing, speaking or moving following the seizure C.  The patient was injured during the seizure D.  The patient has a temperature over 102 F (39C) E.  The patient vomited during the seizure and now is having trouble breathing    During the Seizure   - First, ensure adequate ventilation and place patients on the floor on their left side  Loosen clothing around the neck and ensure the airway is patent. If the patient is clenching the teeth, do not force the mouth open with any object as this can cause severe damage - Remove all items from the surrounding that can be hazardous. The patient may be oblivious to what's happening and may not even know what he or she is doing. If the patient is confused and wandering, either gently guide him/her away and block access to outside areas - Reassure the individual and be comforting - Call 911. In most cases, the seizure ends before EMS arrives. However,  there are cases when seizures may last over 3 to 5 minutes. Or the individual may have developed breathing difficulties or severe injuries. If a pregnant patient or a person with diabetes develops a seizure, it is prudent to call an ambulance. - Finally, if the patient does not regain full consciousness, then call EMS. Most patients will remain confused for about 45 to 90 minutes after a seizure, so you must use judgment in calling for help. - Avoid restraints but make sure the patient is in a bed with padded side rails - Place the individual in a lateral position with the neck slightly flexed; this will help the saliva drain from the mouth and prevent the tongue from falling backward - Remove all nearby furniture and other hazards from the area - Provide verbal assurance as the individual is regaining consciousness - Provide the patient with privacy if  possible - Call for help and start treatment as ordered by the caregiver    After the Seizure (Postictal Stage)   After a seizure, most patients experience confusion, fatigue, muscle pain and/or a headache. Thus, one should permit the individual to sleep. For the next few days, reassurance is essential. Being calm and helping reorient the person is also of importance.   Most seizures are painless and end spontaneously. Seizures are not harmful to others but can lead to complications such as stress on the lungs, brain and the heart. Individuals with prior lung problems may develop labored breathing and respiratory distress.    Increase activity slowly   Complete by: As directed       Allergies as of 04/13/2023       Reactions   Carboplatin Hives, Itching   Carboplatin reaction after dose #5 requiring Benadryl, Solumedrol, epinephrine, Claritin.   Other Hives   Baking Soda   Wound Dressing Adhesive Hives   Rips skin, prefers paper tape        Medication List     STOP taking these medications    atorvastatin 20 MG tablet Commonly known as: LIPITOR   cyclobenzaprine 5 MG tablet Commonly known as: FLEXERIL   LORazepam 1 MG tablet Commonly known as: ATIVAN   omeprazole 40 MG capsule Commonly known as: PRILOSEC       TAKE these medications    levETIRAcetam 1000 MG tablet Commonly known as: KEPPRA Take 1 tablet (1,000 mg total) by mouth 2 (two) times daily.        Follow-up Information     Rema Fendt, NP. Schedule an appointment as soon as possible for a visit in 1 week(s).   Specialty: Nurse Practitioner Contact information: [address] [place] [address] [phone]         Serena Croissant, MD. Schedule an appointment as soon as possible for a visit in 1 week(s).   Specialty: Hematology and Oncology Contact information: [address] [place] [address] [phone]                Allergies  Allergen  Reactions   Carboplatin Hives and Itching    Carboplatin reaction after dose #5 requiring Benadryl, Solumedrol, epinephrine, Claritin.   Other Hives    Baking Soda   Wound Dressing Adhesive Hives    Rips skin, prefers paper tape     Other Procedures/Studies: Overnight EEG with video  Result Date: 04/12/2023 Charlsie Quest, MD     04/13/2023  8:08 AM Patient Name: Sheryl Mcmillan MRN: [id number] Epilepsy Attending: Charlsie Quest  Referring Physician/Provider: Erick Blinks, MD Duration: 04/12/2023 0211 to 04/13/2023 0211 Patient history: 47 y.o. 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 EEG to evaluate for seizure Level of alertness: Awake, asleep AEDs during EEG study: LEV Technical aspects: This EEG study was done with scalp electrodes positioned according to the 10-20 International system of electrode placement. Electrical activity was reviewed with band pass filter of 1-70Hz , sensitivity of 7 uV/mm, display speed of 33mm/sec with a 60Hz  notched filter applied as appropriate. EEG data were recorded continuously and digitally stored.  Video monitoring was available and reviewed as appropriate. Description: The posterior dominant rhythm consists of 9-10 Hz activity of moderate voltage (25-35 uV) seen predominantly in posterior head regions, symmetric and reactive to eye opening and eye closing. Sleep was characterized by vertex waves, sleep spindles (12 to 14 Hz), maximal frontocentral region.  EEG showed continuous 3 to 6 Hz 11 delta slowing with sharply contoured 12 to 15 Hz beta activity in left fronto parietal region consistent with underlying breach artifact.  Hyperventilation and photic stimulation were not performed.   On  04/02/2023 after around 0615, EEG showed rhythmic 2 to 3 Hz delta slowing admixed with lateralized periodic discharges at 1 Hz in left hemisphere, maximal left fronto-parietal region with waxing and waning morphology lasting about 30 seconds each time. This pattern was predominantly noted during sleep and not seen during awake state. ABNORMALITY -Lateralized periodic discharges with overlying rhythmicity, left hemisphere, maximal left fronto- parietal region ( LPD+R) -Breach artifact, left fronto- parietal region IMPRESSION: This study showed evidence of epileptogenicity arising from left hemisphere, maximal left frontoparietal region.  This EEG pattern is on the ictal-interictal continuum with increased risk of seizures.  Additionally there is cortical dysfunction in left frontoparietal region consistent with underlying craniotomy. Charlsie Quest   MR Brain W and Wo Contrast  Result Date: 04/11/2023 CLINICAL DATA:  Brain metastases, assess treatment response EXAM: MRI HEAD WITHOUT AND WITH CONTRAST TECHNIQUE: Multiplanar, multiecho pulse sequences of the brain and surrounding structures were obtained without and with intravenous contrast. CONTRAST:  10mL GADAVIST GADOBUTROL 1 MMOL/ML IV SOLN COMPARISON:  01/16/2023 FINDINGS: Brain: Redemonstrated wispy parasagittal enhancement at the left frontoparietal junction at the resection site, which appears unchanged compared to the prior exam. No new areas of parenchymal or meningeal enhancement. No restricted diffusion to suggest acute or subacute infarct. No acute hemorrhage, mass, mass effect, or midline shift. No hydrocephalus or extra-axial collection. Normal pituitary and craniocervical junction. Vascular: Normal arterial flow voids. Normal arterial and venous enhancement. Skull and upper cervical spine: Vertex craniotomy. Otherwise normal marrow signal. Sinuses/Orbits: Mucous retention cysts in the left sphenoid sinus and left maxillary sinus. No acute finding  in the orbits. Other: The mastoid air cells are well aerated. IMPRESSION: Unchanged appearance of the left frontoparietal resection site. No new areas of parenchymal or meningeal enhancement. Electronically Signed   By: Wiliam Ke M.D.   On: 04/11/2023 22:36     TODAY-DAY OF DISCHARGE:  Subjective:   Sheryl Mcmillan today has no headache,no chest abdominal pain,no new weakness tingling or numbness, feels much better wants to go home today.   Objective:   Blood pressure (!) 154/107, pulse 80, temperature 98  F (36.7 C), temperature source Oral, resp. rate 15, height 5\' 11"  (1.803 m), weight 131 kg, last menstrual period 06/08/2020, SpO2 98%. No intake or output data in the 24 hours ending 04/13/23 0851 Filed Weights   04/11/23 1312 04/13/23 0353  Weight: 132.5 kg 131 kg    Exam: Awake Alert, Oriented *3, No new F.N deficits, Normal affect [place].AT,PERRAL Supple Neck,No JVD, No cervical lymphadenopathy appriciated.  Symmetrical Chest wall movement, Good air movement bilaterally, CTAB RRR,No Gallops,Rubs or new Murmurs, No Parasternal Heave +ve B.Sounds, Abd Soft, Non tender, No organomegaly appriciated, No rebound -guarding or rigidity. No Cyanosis, Clubbing or edema, No new Rash or bruise   PERTINENT RADIOLOGIC STUDIES: Overnight EEG with video  Result Date: 04/12/2023 Charlsie Quest, MD     04/13/2023  8:08 AM Patient Name: Sheryl Mcmillan MRN: [id number] Epilepsy Attending: Charlsie Quest Referring Physician/Provider: Erick Blinks, MD Duration: 04/12/2023 0211 to 04/13/2023 0211 Patient history: 47 y.o. 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 EEG to evaluate for seizure Level of alertness: Awake, asleep AEDs  during EEG study: LEV Technical aspects: This EEG study was done with scalp electrodes positioned according to the 10-20 International system of electrode placement. Electrical activity was reviewed with band pass filter of 1-70Hz , sensitivity of 7 uV/mm, display speed of 69mm/sec with a 60Hz  notched filter applied as appropriate. EEG data were recorded continuously and digitally stored.  Video monitoring was available and reviewed as appropriate. Description: The posterior dominant rhythm consists of 9-10 Hz activity of moderate voltage (25-35 uV) seen predominantly in posterior head regions, symmetric and reactive to eye opening and eye closing. Sleep was characterized by vertex waves, sleep spindles (12 to 14 Hz), maximal frontocentral region.  EEG showed continuous 3 to 6 Hz 11 delta slowing with sharply contoured 12 to 15 Hz beta activity in left fronto parietal region consistent with underlying breach artifact.  Hyperventilation and photic stimulation were not performed.   On 04/02/2023 after around 0615, EEG showed rhythmic 2 to 3 Hz delta slowing admixed with lateralized periodic discharges at 1 Hz in left hemisphere, maximal left fronto-parietal region with waxing and waning morphology lasting about 30 seconds each time. This pattern was predominantly noted during sleep and not seen during awake state. ABNORMALITY -Lateralized periodic discharges with overlying rhythmicity, left hemisphere, maximal left fronto- parietal region ( LPD+R) -Breach artifact, left fronto- parietal region IMPRESSION: This study showed evidence of epileptogenicity arising from left hemisphere, maximal left frontoparietal region.  This EEG pattern is on the ictal-interictal continuum with increased risk of seizures.  Additionally there is cortical dysfunction in left frontoparietal region consistent with underlying craniotomy. Charlsie Quest   MR Brain W and Wo Contrast  Result Date: 04/11/2023 CLINICAL DATA:  Brain  metastases, assess treatment response EXAM: MRI HEAD WITHOUT AND WITH CONTRAST TECHNIQUE: Multiplanar, multiecho pulse sequences of the brain and surrounding structures were obtained without and with intravenous contrast. CONTRAST:  10mL GADAVIST GADOBUTROL 1 MMOL/ML IV SOLN COMPARISON:  01/16/2023 FINDINGS: Brain: Redemonstrated wispy parasagittal enhancement at the left frontoparietal junction at the resection site, which appears unchanged compared to the prior exam. No new areas of parenchymal or meningeal enhancement. No restricted diffusion  to suggest acute or subacute infarct. No acute hemorrhage, mass, mass effect, or midline shift. No hydrocephalus or extra-axial collection. Normal pituitary and craniocervical junction. Vascular: Normal arterial flow voids. Normal arterial and venous enhancement. Skull and upper cervical spine: Vertex craniotomy. Otherwise normal marrow signal. Sinuses/Orbits: Mucous retention cysts in the left sphenoid sinus and left maxillary sinus. No acute finding in the orbits. Other: The mastoid air cells are well aerated. IMPRESSION: Unchanged appearance of the left frontoparietal resection site. No new areas of parenchymal or meningeal enhancement. Electronically Signed   By: Wiliam Ke M.D.   On: 04/11/2023 22:36     PERTINENT LAB RESULTS: CBC: Recent Labs    04/10/23 0950 04/11/23 1509  WBC 9.8 10.7*  HGB 14.5 14.6  HCT 46.1 44.2  PLT 284 330   CMET CMP     Component Value Date/Time   NA 138 04/11/2023 1509   NA 145 (H) 04/10/2023 0950   K 4.2 04/11/2023 1509   CL 100 04/11/2023 1509   CO2 24 04/11/2023 1509   GLUCOSE 127 (H) 04/11/2023 1509   BUN 9 04/11/2023 1509   BUN 10 04/10/2023 0950   CREATININE 0.80 04/11/2023 1509   CREATININE 0.63 07/05/2022 1158   CALCIUM 9.1 04/11/2023 1509   PROT 6.5 04/10/2023 0950   ALBUMIN 4.2 04/10/2023 0950   AST 23 04/10/2023 0950   AST 16 10/17/2021 1112   ALT 29 04/10/2023 0950   ALT 21 10/17/2021 1112    ALKPHOS 126 (H) 04/10/2023 0950   BILITOT 0.2 04/10/2023 0950   BILITOT 0.3 10/17/2021 1112   EGFR 110 04/10/2023 0950   GFRNONAA >60 04/11/2023 1509   GFRNONAA >60 07/05/2022 1158    GFR Estimated Creatinine Clearance: 131.6 mL/min (by C-G formula based on SCr of 0.8 mg/dL). No results for input(s): "LIPASE", "AMYLASE" in the last 72 hours. No results for input(s): "CKTOTAL", "CKMB", "CKMBINDEX", "TROPONINI" in the last 72 hours. Invalid input(s): "POCBNP" No results for input(s): "DDIMER" in the last 72 hours. Recent Labs    04/10/23 0950  HGBA1C 6.3*   Recent Labs    04/10/23 0950  CHOL 226*  HDL 45  LDLCALC 156*  TRIG 138  CHOLHDL 5.0*   Recent Labs    16/10/96 0950  TSH 2.140   No results for input(s): "VITAMINB12", "FOLATE", "FERRITIN", "TIBC", "IRON", "RETICCTPCT" in the last 72 hours. Coags: No results for input(s): "INR" in the last 72 hours.  Invalid input(s): "PT" Microbiology: No results found for this or any previous visit (from the past 240 hour(s)).  FURTHER DISCHARGE INSTRUCTIONS:  Get Medicines reviewed and adjusted: Please take all your medications with you for your next visit with your Primary MD  Laboratory/radiological data: Please request your Primary MD to go over all hospital tests and procedure/radiological results at the follow up, please ask your Primary MD to get all Hospital records sent to his/her office.  In some cases, they will be blood work, cultures and biopsy results pending at the time of your discharge. Please request that your primary care M.D. goes through all the records of your hospital data and follows up on these results.  Also Note the following: If you experience worsening of your admission symptoms, develop shortness of breath, life threatening emergency, suicidal or homicidal thoughts you must seek medical attention immediately by calling 911 or calling your MD immediately  if symptoms less severe.  You must read  complete instructions/literature along with all the possible adverse reactions/side effects for all  the Medicines you take and that have been prescribed to you. Take any new Medicines after you have completely understood and accpet all the possible adverse reactions/side effects.   Do not drive when taking Pain medications or sleeping medications (Benzodaizepines)  Do not take more than prescribed Pain, Sleep and Anxiety Medications. It is not advisable to combine anxiety,sleep and pain medications without talking with your primary care practitioner  Special Instructions: If you have smoked or chewed Tobacco  in the last 2 yrs please stop smoking, stop any regular Alcohol  and or any Recreational drug use.  Wear Seat belts while driving.  Please note: You were cared for by a hospitalist during your hospital stay. Once you are discharged, your primary care physician will handle any further medical issues. Please note that NO REFILLS for any discharge medications will be authorized once you are discharged, as it is imperative that you return to your primary care physician (or establish a relationship with a primary care physician if you do not have one) for your post hospital discharge needs so that they can reassess your need for medications and monitor your lab values.  Total Time spent coordinating discharge including counseling, education and face to face time equals greater than 30 minutes.  SignedJeoffrey Massed 04/13/2023 8:51 AM

## 2023-04-13 NOTE — Plan of Care (Signed)

## 2023-04-13 NOTE — Progress Notes (Signed)
LTM EEG discontinued - no skin breakdown at unhook.  

## 2023-04-13 NOTE — Progress Notes (Signed)
Neurology Progress Note   S:// Patient laying in the bed in NAD. Family at the bedside.  No new neurological  events overnight  LTM Lateralized periodic discharges, left hemisphere, maximal left fronto- parietal region ( LPD), no seizures identified. Will discontinue LTM today   O:// Current vital signs: BP 126/86 (BP Location: Right Wrist)   Pulse 80   Temp 97.7 F (36.5 C) (Oral)   Resp 15   Ht 5\' 11"  (1.803 m)   Wt 131 kg   LMP 06/08/2020   SpO2 98%   BMI 40.28 kg/m  Vital signs in last 24 hours: Temp:  [97.2 F (36.2 C)-98.6 F (37 C)] 97.7 F (36.5 C) (10/19 0353) BP: (111-133)/(80-86) 126/86 (10/19 0353) Weight:  [131 kg] 131 kg (10/19 0353)  General: She is awake alert in no distress HEENT: Normocephalic, atraumatic Lungs: Breathing well and saturating normally on room air Cardiovascular: Regular rate and rhythm on the monitor Neurological exam She is awake alert oriented x 3 Mild dysarthria No aphasia Cranial nerves II to XII intact Motor examination with no drift in any of the 4 extremities Sensation intact light touch Coordination examination with no gross dysmetria   Medications  Current Facility-Administered Medications:    acetaminophen (TYLENOL) tablet 650 mg, 650 mg, Oral, Q6H PRN, Ghimire, Shanker M, MD   albuterol (PROVENTIL) (2.5 MG/3ML) 0.083% nebulizer solution 2.5 mg, 2.5 mg, Nebulization, Q4H PRN, Ghimire, Shanker M, MD   levETIRAcetam (KEPPRA) tablet 1,000 mg, 1,000 mg, Oral, BID, Lindie Spruce O, MD, 1,000 mg at 04/13/23 0804   ondansetron (ZOFRAN) injection 4 mg, 4 mg, Intravenous, Q6H PRN, Maretta Bees, MD  Labs CBC    Component Value Date/Time   WBC 10.7 (H) 04/11/2023 1509   RBC 5.16 (H) 04/11/2023 1509   HGB 14.6 04/11/2023 1509   HGB 14.5 04/10/2023 0950   HCT 44.2 04/11/2023 1509   HCT 46.1 04/10/2023 0950   PLT 330 04/11/2023 1509   PLT 284 04/10/2023 0950   MCV 85.7 04/11/2023 1509   MCV 89 04/10/2023 0950   MCH  28.3 04/11/2023 1509   MCHC 33.0 04/11/2023 1509   RDW 14.4 04/11/2023 1509   RDW 14.3 04/10/2023 0950   LYMPHSABS 3.3 04/11/2023 1509   MONOABS 0.5 04/11/2023 1509   EOSABS 0.1 04/11/2023 1509   BASOSABS 0.1 04/11/2023 1509    CMP     Component Value Date/Time   NA 138 04/11/2023 1509   NA 145 (H) 04/10/2023 0950   K 4.2 04/11/2023 1509   CL 100 04/11/2023 1509   CO2 24 04/11/2023 1509   GLUCOSE 127 (H) 04/11/2023 1509   BUN 9 04/11/2023 1509   BUN 10 04/10/2023 0950   CREATININE 0.80 04/11/2023 1509   CREATININE 0.63 07/05/2022 1158   CALCIUM 9.1 04/11/2023 1509   PROT 6.5 04/10/2023 0950   ALBUMIN 4.2 04/10/2023 0950   AST 23 04/10/2023 0950   AST 16 10/17/2021 1112   ALT 29 04/10/2023 0950   ALT 21 10/17/2021 1112   ALKPHOS 126 (H) 04/10/2023 0950   BILITOT 0.2 04/10/2023 0950   BILITOT 0.3 10/17/2021 1112   GFRNONAA >60 04/11/2023 1509   GFRNONAA >60 07/05/2022 1158   GFRAA >60 06/06/2019 1205    Lipid Panel     Component Value Date/Time   CHOL 226 (H) 04/10/2023 0950   TRIG 138 04/10/2023 0950   HDL 45 04/10/2023 0950   CHOLHDL 5.0 (H) 04/10/2023 0950   LDLCALC 156 (H) 04/10/2023 0950  Lab Results  Component Value Date   HGBA1C 6.3 (H) 04/10/2023     Imaging I have reviewed images in epic and the results pertinent to this consultation are: MRI brain with no new mets  LTM 10/19: This study showed evidence of epileptogenicity and cortical dysfunction arising from left hemisphere, maximal left frontoparietal region secondary to underlying craniotomy. No definite seizures were noted.   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  Brought into  [facility] for formal evaluation No further episodes since arrival here but EEG shows left-sided lateralized periodic discharges with overlying rhythmicity-LPD + R pattern which is high risk for seizure recurrence.  Keppra dose was increased overnight.  Remains seizure-free and slowly improving. If remains seizure-free for the next 24 hours, will discontinue LTM and discharge  Impression: Breakthrough seizure  Recommendations: -Continue Keppra 1 g twice daily -Discontinue LTM  -Maintain seizure precautions -Will need outpatient follow up with neurology after discharge  - Patient may be discharged for neurology standpoint  - Neurology will sign off. Please call with questions or concerns   -Plan discussed with Dr. Pearletha Furl PRECAUTIONS Per [facility] statutes, patients with seizures are not allowed to drive until they have been seizure-free for six months.   Use caution when using heavy equipment or power tools. Avoid working on ladders or at heights. Take showers instead of baths. Ensure the water temperature is not too high on the home water heater. Do not go swimming alone. Do not lock yourself in a room alone (i.e. bathroom). When caring for infants or small children, sit down when holding, feeding, or changing them to minimize risk of injury to the child in the event you have a seizure. Maintain good sleep hygiene. Avoid alcohol.    If patient has another seizure, call 911 and bring them back to the ED if: A.  The seizure lasts longer than 5 minutes.      B.  The patient doesn't wake shortly after the seizure or has new problems such as difficulty seeing, speaking or moving following the seizure C.  The patient was injured during the seizure D.  The patient has a temperature over 102 F (39C) E.  The patient vomited during the seizure and now is having trouble breathing  Gevena Mart DNP, ACNPC-AG  [facility]  Attending Neurohospitalist Addendum Patient  seen and examined with APP/Resident. Agree with the history and physical as documented above. Agree with the plan as documented, which I helped formulate. I have independently reviewed the chart, obtained history, review of systems and examined the patient.I have personally reviewed pertinent head/neck/spine imaging (CT/MRI). Please feel free to call with any questions.  -- Milon Dikes, MD Neurologist [facility] Pager: [phone]

## 2023-04-13 NOTE — Plan of Care (Signed)
Pt has rested quietly throughout the night with no distress noted. Alert and oriented. On room air. SR on the monitor. Up to [facility] to void. EEG continues. No seizure activity noted. No complaints voiced.     Problem: Education: Goal: Knowledge of General Education information will improve Description: Including pain rating scale, medication(s)/side effects and non-pharmacologic comfort measures Outcome: Progressing   Problem: Health Behavior/Discharge Planning: Goal: Ability to manage health-related needs will improve Outcome: Progressing   Problem: Clinical Measurements: Goal: Ability to maintain clinical measurements within normal limits will improve Outcome: Progressing Goal: Will remain free from infection Outcome: Progressing Goal: Diagnostic test results will improve Outcome: Progressing Goal: Respiratory complications will improve Outcome: Progressing Goal: Cardiovascular complication will be avoided Outcome: Progressing   Problem: Activity: Goal: Risk for activity intolerance will decrease Outcome: Progressing   Problem: Nutrition: Goal: Adequate nutrition will be maintained Outcome: Progressing   Problem: Coping: Goal: Level of anxiety will decrease Outcome: Progressing   Problem: Elimination: Goal: Will not experience complications related to bowel motility Outcome: Progressing Goal: Will not experience complications related to urinary retention Outcome: Progressing   Problem: Pain Managment: Goal: General experience of comfort will improve Outcome: Progressing   Problem: Safety: Goal: Ability to remain free from injury will improve Outcome: Progressing   Problem: Skin Integrity: Goal: Risk for impaired skin integrity will decrease Outcome: Progressing

## 2023-04-15 ENCOUNTER — Telehealth: Payer: Self-pay

## 2023-04-16 ENCOUNTER — Encounter: Payer: Self-pay | Admitting: Hematology and Oncology

## 2023-04-18 ENCOUNTER — Telehealth: Payer: Self-pay

## 2023-04-18 ENCOUNTER — Ambulatory Visit: Payer: Medicaid Other | Admitting: Physical Therapy

## 2023-04-18 NOTE — Transitions of Care (Post Inpatient/ED Visit) (Signed)
04/18/2023  Name: Sheryl Mcmillan MRN: [id number] DOB: 1976-04-22  Today's TOC FU Call Status: Today's TOC FU Call Status:: Successful TOC FU Call Completed TOC FU Call Complete Date: 04/15/23 Patient's Name and Date of Birth confirmed.  Transition Care Management Follow-up Telephone Call Date of Discharge: 04/13/23 Discharge [facility]) Type of Discharge: Inpatient Admission Primary Inpatient Discharge Diagnosis:: new onset of seizures, brain cancer, breast cancer How have you been since you were released from the hospital?: Same (I feel crazy) Any questions or concerns?: No  Items Reviewed: Did you receive and understand the discharge instructions provided?: Yes Medications obtained,verified, and reconciled?: Yes (Medications Reviewed) Any new allergies since your discharge?: No Dietary orders reviewed?: NA Do you have support at home?: Yes People in Home: child(ren), adult Name of Support/Comfort Primary Source: Jasmine  Medications Reviewed Today: Medications Reviewed Today     Reviewed by Earlie Server, RN (Registered Nurse) on 04/15/23 at 1130  Med List Status: <None>   Medication Order Taking? Sig Documenting Provider Last Dose Status Informant  levETIRAcetam (KEPPRA) 1000 MG tablet [id number] Yes Take 1 tablet (1,000 mg total) by mouth 2 (two) times daily. Ghimire, Werner Lean, MD Taking Active             Home Care and Equipment/Supplies: Were Home Health Services Ordered?: Yes Name of Home Health Agency:: [facility]  M-F Has Agency set up a time to come to your home?: Yes Any new equipment or medical supplies ordered?: No  Functional Questionnaire: Do you need assistance with bathing/showering or dressing?: Yes (new issue since onset of seizures) Do you need assistance with meal preparation?: Yes Do you need assistance with eating?: No Do you have difficulty maintaining continence: No Do you need assistance  with getting out of bed/getting out of a chair/moving?: No (has hand rail on bed) Do you have difficulty managing or taking your medications?: Yes (daughter manages medications)  Follow up appointments reviewed: PCP Follow-up appointment confirmed?:  (daughter request to call herself an make an appointment.) Specialist Hospital Follow-up appointment confirmed?: Yes Date of Specialist follow-up appointment?: 04/22/23 Follow-Up Specialty Provider:: Neurologist Do you need transportation to your follow-up appointment?: No Do you understand care options if your condition(s) worsen?: Yes-patient verbalized understanding  SDOH Interventions Today    Flowsheet Row Most Recent Value  SDOH Interventions   Food Insecurity Interventions Intervention Not Indicated  Transportation Interventions Intervention Not Indicated      Patient provided permission to speak with daughter, Leavy Cella.  Jasmine reports that since her mothers seizures that the patient has been confused, sluggish, sensitive to sounds and  stimulation.  Jasmine reports patient now needs assistance with dressing, bathing, cooking and most all activities.  Daughter reports PCS/CAPS services from 11-2 M-F.  Daughter reports no other family and patient needs increased hours.   Reviewed case with Marja Kays RN director, I will follow up Medicaid carrier to update on change in patients condition and new needs.   Daughter agreed to 30 day TOC program.    Lonia Chimera, RN, BSN, CEN Population Health- Transition of Care Team.  [facility] [phone]

## 2023-04-18 NOTE — Patient Outreach (Addendum)
Care Management  Transitions of Care Program Transitions of Care Post-discharge week 2   04/18/2023 Name: Sheryl Mcmillan MRN: [id number] DOB: 02/06/1976  Subjective: Sheryl Mcmillan is a 47 y.o. year old female who is a primary care patient of Rema Fendt, NP. The Care Management team Engaged with patient Engaged with patient by telephone to assess and address transitions of care needs.   Consent to Services:  Patient was given information about Managed Medicaid Care Management services, agreed to services, and gave verbal consent to participate.   Assessment: Patient continues to struggle with confusion, ADLS, IADLS, self care, meals and needing care 24/7.  Daughter denies any seizures since patient got home from hospital. Daughter states that patient has starring episodes. After about 30 seconds daughter reports she gets scare and calls patients name until she snaps out of it.           SDOH Interventions    Flowsheet Row Telephone from 04/15/2023 in [place] POP[facility]ATION HEALTH DEPARTMENT  SDOH Interventions   Food Insecurity Interventions Intervention Not Indicated  Transportation Interventions Intervention Not Indicated        Goals Addressed               This Visit's Progress     Patient will report no readmissions to the hospital in the next 30 days. (pt-stated)        Current Barriers:  Diet/Nutrition/Food Resources : unable to Avree Szczygiel for herself Home Health services needs increase in hours for PCS and or CAPS Functional/Safety Needs 24 hour supervision Inability to perform ADLS and IADLS  RNCM Clinical Goal(s):  Patient will work with the Care Management team over the next 30 days to address Transition of Care Barriers: Diet/Nutrition/Food Resources Support at home Home Health services Functional/Safety take all medications exactly as prescribed and will call provider for medication related questions as evidenced by daughter report attend all scheduled  medical appointments: PCP and specialist as evidenced by daughters report continue to work with RN Care Manager to address care management and care coordination needs related to  cancer as evidenced by adherence to CM Team Scheduled appointments through collaboration with RN Care manager, provider, and care team.   Interventions:  placed call to [employer] and spoke with [employer].  I got MOM meals ordered for 7 days for 2 meals per day. I got patient signed up with [employer] Care Manager I reviewed patients need for additional PCS/CAP hours and was informed to call agency.  Agency is [facility] at [phone].  Spoke with Shanda Bumps and requested a PRIOR Authorization per request of [employer].  [facility] stated they would send request to PCP. I provided fax number. I sent a secure chat to MD to request form to be completed. Max hours are 105 per month.  Patient is currently getting 56 per week.  Reminded daughter to request a MY CHART appointment for asap.  Daughter did so while on the phone with me.    Oncology:  (Status: New goal.) Short Term Goal Assessment of understanding of oncology diagnosis:  Assessed patient understanding of cancer diagnosis and recommended treatment plan  Patient Goals/Self-Care Activities: Participate in Transition of Care Program/Attend [facility] scheduled calls Notify RN Care Manager of TOC call rescheduling needs Take all medications as prescribed Attend all scheduled provider appointments Call provider office for new concerns or questions   Follow Up Plan:  Telephone follow up appointment with care management team member scheduled for:  04/25/2023  Plan: Telephone follow up appointment with care management team member scheduled for:04/25/2023  at 1:45 pm.  Provided my contact information for daughter to call sooner if needed.    Lonia Chimera, RN, BSN, CEN [employer]- Transition of Care Team.  [facility] [phone]

## 2023-04-22 ENCOUNTER — Other Ambulatory Visit: Payer: Self-pay | Admitting: *Deleted

## 2023-04-22 ENCOUNTER — Inpatient Hospital Stay: Payer: Medicaid Other | Attending: Hematology and Oncology | Admitting: Internal Medicine

## 2023-04-22 ENCOUNTER — Telehealth: Payer: Self-pay | Admitting: Internal Medicine

## 2023-04-22 VITALS — BP 132/96 | HR 91 | Temp 97.0°F | Resp 18 | Ht 71.0 in | Wt 294.1 lb

## 2023-04-22 DIAGNOSIS — Z7901 Long term (current) use of anticoagulants: Secondary | ICD-10-CM | POA: Insufficient documentation

## 2023-04-22 DIAGNOSIS — Z87891 Personal history of nicotine dependence: Secondary | ICD-10-CM | POA: Insufficient documentation

## 2023-04-22 DIAGNOSIS — R569 Unspecified convulsions: Secondary | ICD-10-CM | POA: Insufficient documentation

## 2023-04-22 DIAGNOSIS — C7931 Secondary malignant neoplasm of brain: Secondary | ICD-10-CM | POA: Diagnosis not present

## 2023-04-22 DIAGNOSIS — C50412 Malignant neoplasm of upper-outer quadrant of left female breast: Secondary | ICD-10-CM | POA: Diagnosis present

## 2023-04-22 DIAGNOSIS — F1210 Cannabis abuse, uncomplicated: Secondary | ICD-10-CM | POA: Insufficient documentation

## 2023-04-22 DIAGNOSIS — G620 Drug-induced polyneuropathy: Secondary | ICD-10-CM | POA: Insufficient documentation

## 2023-04-22 DIAGNOSIS — T451X5A Adverse effect of antineoplastic and immunosuppressive drugs, initial encounter: Secondary | ICD-10-CM | POA: Diagnosis not present

## 2023-04-22 DIAGNOSIS — Z9013 Acquired absence of bilateral breasts and nipples: Secondary | ICD-10-CM | POA: Insufficient documentation

## 2023-04-22 MED ORDER — LORAZEPAM 1 MG PO TABS: ORAL_TABLET | ORAL | 0 refills | Status: DC

## 2023-04-22 MED ORDER — LACOSAMIDE 100 MG PO TABS
100.0000 mg | ORAL_TABLET | Freq: Two times a day (BID) | ORAL | 2 refills | Status: DC
Start: 2023-04-22 — End: 2023-04-24

## 2023-04-22 NOTE — Telephone Encounter (Signed)
Spoke with patient confirming upcoming appointment  

## 2023-04-22 NOTE — Progress Notes (Signed)
[facility] at [facility] [address]  [place], [place] [address] [phone]   Interval Evaluation  Date of Service: 04/22/23 Patient Name: Sheryl Mcmillan Patient MRN: [id number] Patient DOB: 14-May-1976 Provider: Henreitta Leber, MD  Identifying Statement:  Sheryl Mcmillan is a 47 y.o. female with Metastatic cancer to brain [facility])  Peripheral neuropathy due to chemotherapy [facility])  Seizure (HCC)   Primary Cancer:  Oncologic History: Oncology History  Malignant neoplasm of upper-outer quadrant of left breast in female, estrogen receptor positive (HCC)  05/26/2020 Initial Diagnosis   Malignant neoplasm of upper-outer quadrant of left breast in female, estrogen receptor positive (HCC)   06/01/2020 Cancer Staging   Staging form: Breast, AJCC 8th Edition - Clinical stage from 06/01/2020: Stage IIA (cT2, cN0, cM0, G3, ER+, PR-, HER2+) - Signed by Rachel Moulds, MD on 11/21/2022 Stage prefix: Initial diagnosis Histologic grading system: 3 grade system   06/16/2020 - 09/30/2020 Chemotherapy      Patient is on Antibody Plan: BREAST TRASTUZUMAB Q21D    06/16/2020 Genetic Testing   Positive genetic testing: 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  Variant of uncertain significance in MSH3 at c.2724A>G (Silent).  No other pathogenic or uncertain variants were reported in the [facility] Multi-Cancer Panel.  The report date is June 16, 2020.    The variant of uncertain significance (VUS) in MSH3 at c.2724A>G (Silent) has been reclassified to likely benign.  The change in variant classification was made as a result of re-review of evidence in light of new variant interpretation guidelines and/or new information. The amended report date is January 10, 2021.   The Multi-Cancer Panel offered by [facility] includes sequencing and/or deletion duplication testing of the following 85 genes: AIP, ALK, APC, ATM, AXIN2,BAP1,  BARD1, BLM, BMPR1A, BRCA1, BRCA2,  BRIP1, CASR, CDC73, CDH1, CDK4, CDKN1B, CDKN1C, CDKN2A (p14ARF), CDKN2A (p16INK4a), CEBPA, CHEK2, CTNNA1, DICER1, DIS3L2, EGFR (c.2369C>T, p.Thr790Met variant only), EPCAM (Deletion/duplication testing only), FH, FLCN, GATA2, GPC3, GREM1 (Promoter region deletion/duplication testing only), HOXB13 (c.251G>A, p.Gly84Glu), HRAS, KIT, MAX, MEN1, MET, MITF (c.952G>A, p.Glu318Lys variant only), MLH1, MSH2, MSH3, MSH6, MUTYH, NBN, NF1, NF2, NTHL1, PALB2, PDGFRA, PHOX2B, PMS2, POLD1, POLE, POT1, PRKAR1A, PTCH1, PTEN, RAD50, RAD51C, RAD51D, RB1, RECQL4, RET, RNF43, RUNX1, SDHAF2, SDHA (sequence changes only), SDHB, SDHC, SDHD, SMAD4, SMARCA4, SMARCB1, SMARCE1, STK11, SUFU, TERC, TERT, TMEM127, TP53, TSC1, TSC2, VHL, WRN and WT1.    10/20/2020 - 10/20/2020 Chemotherapy    Patient is on Treatment Plan: BREAST  DOCETAXEL + CARBOPLATIN + TRASTUZUMAB + PERTUZUMAB  (TCHP) Q21D       10/20/2020 - 06/23/2021 Chemotherapy   Patient is on Treatment Plan : BREAST Trastuzumab q21d     11/10/2021 - 11/10/2021 Radiation Therapy   Site Technique Total Dose (Gy) Dose per Fx (Gy) Completed Fx Beam Energies  Brain: Brain [id number] IMRT 18/18 18 1/1 6XFFF      CNS Oncologic History 11/10/21: Pre-op SRS L frontal (Moody) 11/12/21: L frontal craniotomy, resection [name]) 07/10/22: Salvage SRS R frontal Mitzi Hansen)  Interval History: [name] presents today for follow up after recent admission for seizures. Initially presented to neurologic attention on 04/07/23 while visiting family in [place], described episode of right sided shaking, followed by weakness and confusion.  She was started on Keppra, but the event recurred after returning to [facility].  Following an admission here, Keppra was increased to 1000mg  twice per day.  No recurrence of events since discharge ~10 days ago, but there have been significant  mood effects.  She has been very irritable and lashing out at family.  Denies headaches.  Prior:  Patient presents today following recent MRI brain.  She describes some periods of word finding difficulty.  This has improved since she stopped working and lowered her stress level.  No other new or progressive changes.  Denies seizures, headaches.  Medications: Current Outpatient Medications on File Prior to Visit  Medication Sig Dispense Refill   levETIRAcetam (KEPPRA) 1000 MG tablet Take 1 tablet (1,000 mg total) by mouth 2 (two) times daily. 60 tablet 2   No current facility-administered medications on file prior to visit.    Allergies:  Allergies  Allergen Reactions   Carboplatin Hives and Itching    Carboplatin reaction after dose #5 requiring Benadryl, Solumedrol, epinephrine, Claritin.   Other Hives    Baking Soda   Wound Dressing Adhesive Hives    Rips skin, prefers paper tape   Past Medical History:  Past Medical History:  Diagnosis Date   Arthritis    Breast cancer (HCC)    Family history of non-Hodgkin's lymphoma 06/01/2020   GERD (gastroesophageal reflux disease)    Hemorrhoids    Migraines    PCOS (polycystic ovarian syndrome)    PONV (postoperative nausea and vomiting)    Past Surgical History:  Past Surgical History:  Procedure Laterality Date   APPLICATION OF CRANIAL NAVIGATION Left 11/10/2021   Procedure: APPLICATION OF CRANIAL NAVIGATION;  Surgeon: [name], MD;  Location: [facility] OR;  Service: Neurosurgery;  Laterality: Left;   AXILLARY SENTINEL NODE BIOPSY Left 11/07/2020   Procedure: LEFT AXILLARY SENTINEL NODE BIOPSY;  Surgeon: [name], MD;  Location[facility] OR;  Service: General;  Laterality: Left;   BREAST CYST EXCISION Right 09/22/2021   Procedure: Excision of right axillary excess soft tissue.;  Surgeon: [name], MD;  Location: [facility];  Service: Plastics;  Laterality: Right;   BREAST RECONSTRUCTION WITH PLACEMENT OF TISSUE EXPANDER AND FLEX HD (ACELLULAR HYDRATED DERMIS) Bilateral 11/07/2020   Procedure: BILATERAL  BREAST RECONSTRUCTION WITH PLACEMENT OF TISSUE EXPANDER AND FLEX HD (ACELLULAR HYDRATED DERMIS);  Surgeon: [name], MD;  Location: [facility] OR;  Service: Plastics;  Laterality: Bilateral;   COSMETIC SURGERY     CRANIOTOMY Left 11/10/2021   Procedure: LT FRONTAL CRANIOTOMY TUMOR EXCISION;  Surgeon: [name], MD;  Location: [facility] OR;  Service: Neurosurgery;  Laterality: Left;   DILATION AND CURETTAGE OF UTERUS     HEMORRHOID SURGERY     IR IMAGING GUIDED PORT INSERTION  06/15/2020   LIPOSUCTION WITH LIPOFILLING Bilateral 09/22/2021   Procedure: Fat grafting bilateral breast;  Surgeon: [name], MD;  Location: [facility];  Service: Plastics;  Laterality: Bilateral;   MASS EXCISION Bilateral 04/25/2021   Procedure: excision of bilateral axillary breast tissue;  Surgeon: [name], MD;  Location: [facility];  Service: Plastics;  Laterality: Bilateral;   PORT-A-CATH REMOVAL Right 11/07/2020   Procedure: REMOVAL PORT-A-CATH;  Surgeon: [name], MD;  Location: [facility] OR;  Service: General;  Laterality: Right;   REMOVAL OF BILATERAL TISSUE EXPANDERS WITH PLACEMENT OF BILATERAL BREAST IMPLANTS Bilateral 04/25/2021   Procedure: REMOVAL OF BILATERAL TISSUE EXPANDERS WITH PLACEMENT OF BILATERAL BREAST IMPLANTS;  Surgeon: [name], MD;  Location:  SURGERY CENTER;  Service: Plastics;  Laterality: Bilateral;   ROBOTIC ASSISTED TOTAL HYSTERECTOMY WITH BILATERAL SALPINGO OOPHERECTOMY Bilateral 03/14/2021   Procedure: XI ROBOTIC ASSISTED TOTAL HYSTERECTOMY WITH BILATERAL SALPINGO OOPHORECTOMY;  Surgeon:  [name], MD;  Location: [facility];  Service: Gynecology;  Laterality: Bilateral;   TOTAL MASTECTOMY Bilateral 11/07/2020   Procedure: BILATERAL MASTECTOMY;  Surgeon: [name], MD;  Location: [facility] OR;  Service: General;  Laterality: Bilateral;   Social History:  Social History   Socioeconomic History   Marital status: Single    Spouse  name: Not on file   Number of children: 1   Years of education: Not on file   Highest education level: Some college, no degree  Occupational History   Not on file  Tobacco Use   Smoking status: Former    Current packs/day: 0.00    Average packs/day: 0.5 packs/day for 10.0 years (5.0 ttl pk-yrs)    Types: Cigarettes    Start date: 08/24/2010    Quit date: 08/23/2020    Years since quitting: 2.6    Passive exposure: Never   Smokeless tobacco: Never  Vaping Use   Vaping status: Never Used  Substance and Sexual Activity   Alcohol use: Not Currently    Comment: rare   Drug use: Yes    Types: Marijuana    Comment: Last use 11/07/21   Sexual activity: Yes    Birth control/protection: Surgical    Comment: Hysterectomy  Other Topics Concern   Not on file  Social History Narrative   Not on file   Social Determinants of Health   Financial Resource Strain: Not on file  Food Insecurity: No Food Insecurity (04/15/2023)   Hunger Vital Sign    Worried About Running Out of Food in the Last Year: Never true    Ran Out of Food in the Last Year: Never true  Transportation Needs: No Transportation Needs (04/15/2023)   PRAPARE - Administrator, Civil Service (Medical): No    Lack of Transportation (Non-Medical): No  Physical Activity: Not on file  Stress: Not on file  Social Connections: Not on file  Intimate Partner Violence: Not At Risk (04/11/2023)   Humiliation, Afraid, Rape, and Kick questionnaire    Fear of Current or Ex-Partner: No    Emotionally Abused: No    Physically Abused: No    Sexually Abused: No   Family History:  Family History  Problem Relation Age of Onset   Hypertension Mother    Diabetes Mother    Cancer Mother 61       unknown primary   Other Mother        brain tumor; dx 88s   Non-Hodgkin's lymphoma Brother 73   Other Maternal Uncle 1       brain tumor   Breast cancer Other        MGM's niece; dx late 76s   Pancreatic cancer Neg Hx    Colon  cancer Neg Hx    Endometrial cancer Neg Hx    Prostate cancer Neg Hx    Ovarian cancer Neg Hx     Review of Systems: Constitutional: Doesn't report fevers, chills or abnormal weight loss Eyes: Doesn't report blurriness of vision Ears, nose, mouth, throat, and face: Doesn't report sore throat Respiratory: Doesn't report cough, dyspnea or wheezes Cardiovascular: Doesn't report palpitation, chest discomfort  Gastrointestinal:  Doesn't report nausea, constipation, diarrhea GU: Doesn't report incontinence Skin: Doesn't report skin rashes Neurological: Per HPI Musculoskeletal: Doesn't report joint pain Behavioral/Psych: Doesn't report anxiety  Physical Exam: Vitals:   04/22/23 1148  BP: (!) 132/96  Pulse: 91  Resp: 18  Temp: (!) 97 F (36.1 C)  KPS: 90. General: Alert, cooperative, pleasant, in no acute distress Head: Normal EENT: No conjunctival injection or scleral icterus.  Lungs: Resp effort normal Cardiac: Regular rate Abdomen: Non-distended abdomen Skin: No rashes cyanosis or petechiae. Extremities: No clubbing or edema  Neurologic Exam: Mental Status: Awake, alert, attentive to examiner. Oriented to self and environment. Language is fluent with intact comprehension.  Cranial Nerves: Visual acuity is grossly normal. Visual fields are full. Extra-ocular movements intact. No ptosis. Face is symmetric Motor: Tone and bulk are normal. Power is full in both arms and legs. Reflexes are symmetric, no pathologic reflexes present.  Sensory: Intact to light touch Gait: Normal.   Labs: I have reviewed the data as listed    Component Value Date/Time   NA 138 04/11/2023 1509   NA 145 (H) 04/10/2023 0950   K 4.2 04/11/2023 1509   CL 100 04/11/2023 1509   CO2 24 04/11/2023 1509   GLUCOSE 127 (H) 04/11/2023 1509   BUN 9 04/11/2023 1509   BUN 10 04/10/2023 0950   CREATININE 0.80 04/11/2023 1509   CREATININE 0.63 07/05/2022 1158   CALCIUM 9.1 04/11/2023 1509   PROT 6.5  04/10/2023 0950   ALBUMIN 4.2 04/10/2023 0950   AST 23 04/10/2023 0950   AST 16 10/17/2021 1112   ALT 29 04/10/2023 0950   ALT 21 10/17/2021 1112   ALKPHOS 126 (H) 04/10/2023 0950   BILITOT 0.2 04/10/2023 0950   BILITOT 0.3 10/17/2021 1112   GFRNONAA >60 04/11/2023 1509   GFRNONAA >60 07/05/2022 1158   GFRAA >60 06/06/2019 1205   Lab Results  Component Value Date   WBC 10.7 (H) 04/11/2023   NEUTROABS 6.8 04/11/2023   HGB 14.6 04/11/2023   HCT 44.2 04/11/2023   MCV 85.7 04/11/2023   PLT 330 04/11/2023    Imaging:  Overnight EEG with video  Result Date: 04/12/2023 Charlsie Quest, MD     04/13/2023  8:08 AM Patient Name: DELETA DICKSTEIN MRN: [id number] Epilepsy Attending: Charlsie Quest Referring Physician/Provider: Erick Blinks, MD Duration: 04/12/2023 0211 to 04/13/2023 0211 Patient history: 47 y.o. 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 EEG to evaluate for seizure Level of alertness: Awake, asleep AEDs during EEG study: LEV Technical aspects: This EEG study was done with scalp electrodes positioned according to the 10-20 International system of electrode placement. Electrical activity was reviewed with band pass filter of 1-70Hz , sensitivity of 7 uV/mm, display speed of 35mm/sec with a 60Hz  notched filter applied as appropriate. EEG data were recorded continuously and digitally stored.  Video monitoring was available and reviewed as appropriate. Description: The posterior dominant rhythm consists of 9-10 Hz activity of moderate voltage (25-35 uV) seen predominantly in posterior head regions, symmetric and reactive to eye opening and eye closing. Sleep was characterized by vertex waves, sleep spindles (12 to 14  Hz), maximal frontocentral region.  EEG showed continuous 3 to 6 Hz 11 delta slowing with sharply contoured 12 to 15 Hz beta activity in left fronto parietal region consistent with underlying breach artifact.  Hyperventilation and photic stimulation were not performed.   On 04/02/2023 after around 0615, EEG showed rhythmic 2 to 3 Hz delta slowing admixed with lateralized periodic  discharges at 1 Hz in left hemisphere, maximal left fronto-parietal region with waxing and waning morphology lasting about 30 seconds each time. This pattern was predominantly noted during sleep and not seen during awake state. ABNORMALITY -Lateralized periodic discharges with overlying rhythmicity, left hemisphere, maximal left fronto- parietal region ( LPD+R) -Breach artifact, left fronto- parietal region IMPRESSION: This study showed evidence of epileptogenicity arising from left hemisphere, maximal left frontoparietal region.  This EEG pattern is on the ictal-interictal continuum with increased risk of seizures.  Additionally there is cortical dysfunction in left frontoparietal region consistent with underlying craniotomy. Charlsie Quest   MR Brain W and Wo Contrast  Result Date: 04/11/2023 CLINICAL DATA:  Brain metastases, assess treatment response EXAM: MRI HEAD WITHOUT AND WITH CONTRAST TECHNIQUE: Multiplanar, multiecho pulse sequences of the brain and surrounding structures were obtained without and with intravenous contrast. CONTRAST:  10mL GADAVIST GADOBUTROL 1 MMOL/ML IV SOLN COMPARISON:  01/16/2023 FINDINGS: Brain: Redemonstrated wispy parasagittal enhancement at the left frontoparietal junction at the resection site, which appears unchanged compared to the prior exam. No new areas of parenchymal or meningeal enhancement. No restricted diffusion to suggest acute or subacute infarct. No acute hemorrhage, mass, mass effect, or midline shift. No hydrocephalus or extra-axial collection. Normal pituitary and craniocervical  junction. Vascular: Normal arterial flow voids. Normal arterial and venous enhancement. Skull and upper cervical spine: Vertex craniotomy. Otherwise normal marrow signal. Sinuses/Orbits: Mucous retention cysts in the left sphenoid sinus and left maxillary sinus. No acute finding in the orbits. Other: The mastoid air cells are well aerated. IMPRESSION: Unchanged appearance of the left frontoparietal resection site. No new areas of parenchymal or meningeal enhancement. Electronically Signed   By: Wiliam Ke M.D.   On: 04/11/2023 22:36    [facility] Clinician Interpretation: I have personally reviewed the radiological images as listed.  My interpretation, in the context of the patient's clinical presentation, is stable disease   Assessment/Plan Metastatic cancer to brain [facility])  Peripheral neuropathy due to chemotherapy [facility])  Karma Lew Fell is clinically stable today, following admission for new onset focal seizures.  MRI brain demonstrates stable findings, now 9 months s/p salvage R frontal SRS.   For seizures, due to mood effects of Keppra, recommended transitioning to Vimpat 100mg  BID.  We also discussed Lamictal, but avoided due to prominent history of drug reaction/rash.  Counseled on seizure precautions, epilepsy safety, driving restrictions.  We appreciate the opportunity to participate in the care of [facility].   We ask that [name] return to clinic in 3 months following next brain MRI, or sooner as needed.  All questions were answered. The patient knows to call the clinic with any problems, questions or concerns. No barriers to learning were detected.  The total time spent in the encounter was 40 minutes and more than 50% was on counseling and review of test results   Henreitta Leber, MD Medical Director of Neuro-Oncology [facility] 04/22/23 11:41 AM

## 2023-04-24 ENCOUNTER — Encounter (HOSPITAL_COMMUNITY): Payer: Self-pay | Admitting: Emergency Medicine

## 2023-04-24 ENCOUNTER — Emergency Department (HOSPITAL_COMMUNITY)
Admission: EM | Admit: 2023-04-24 | Discharge: 2023-04-24 | Disposition: A | Discharge status: Home / Self Care | Payer: Medicaid Other | Attending: Emergency Medicine | Admitting: Emergency Medicine

## 2023-04-24 ENCOUNTER — Telehealth: Payer: Self-pay

## 2023-04-24 ENCOUNTER — Other Ambulatory Visit: Payer: Self-pay

## 2023-04-24 DIAGNOSIS — R569 Unspecified convulsions: Secondary | ICD-10-CM | POA: Diagnosis present

## 2023-04-24 HISTORY — DX: Unspecified convulsions: R56.9

## 2023-04-24 LAB — CBC WITH DIFFERENTIAL/PLATELET
Abs Immature Granulocytes: 0.07 10*3/uL (ref 0.00–0.07)
Basophils Absolute: 0.1 10*3/uL (ref 0.0–0.1)
Basophils Relative: 1 %
Eosinophils Absolute: 0.3 10*3/uL (ref 0.0–0.5)
Eosinophils Relative: 2 %
HCT: 44.9 % (ref 36.0–46.0)
Hemoglobin: 14.8 g/dL (ref 12.0–15.0)
Immature Granulocytes: 1 %
Lymphocytes Relative: 31 %
Lymphs Abs: 3.3 10*3/uL (ref 0.7–4.0)
MCH: 28.6 pg (ref 26.0–34.0)
MCHC: 33 g/dL (ref 30.0–36.0)
MCV: 86.7 fL (ref 80.0–100.0)
Monocytes Absolute: 0.5 10*3/uL (ref 0.1–1.0)
Monocytes Relative: 5 %
Neutro Abs: 6.5 10*3/uL (ref 1.7–7.7)
Neutrophils Relative %: 60 %
Platelets: 319 10*3/uL (ref 150–400)
RBC: 5.18 MIL/uL — ABNORMAL HIGH (ref 3.87–5.11)
RDW: 14.3 % (ref 11.5–15.5)
WBC: 10.6 10*3/uL — ABNORMAL HIGH (ref 4.0–10.5)
nRBC: 0 % (ref 0.0–0.2)

## 2023-04-24 LAB — BASIC METABOLIC PANEL
Anion gap: 10 (ref 5–15)
BUN: 8 mg/dL (ref 6–20)
CO2: 21 mmol/L — ABNORMAL LOW (ref 22–32)
Calcium: 8.8 mg/dL — ABNORMAL LOW (ref 8.9–10.3)
Chloride: 109 mmol/L (ref 98–111)
Creatinine, Ser: 0.74 mg/dL (ref 0.44–1.00)
GFR, Estimated: >60 mL/min (ref >60)
Glucose, Bld: 132 mg/dL — ABNORMAL HIGH (ref 70–99)
Potassium: 4.3 mmol/L (ref 3.5–5.1)
Sodium: 140 mmol/L (ref 135–145)

## 2023-04-24 LAB — CBG MONITORING, ED: Glucose-Capillary: 116 mg/dL — ABNORMAL HIGH (ref 70–99)

## 2023-04-24 MED ORDER — LEVETIRACETAM 1000 MG PO TABS: 1000.0000 mg | ORAL_TABLET | Freq: Two times a day (BID) | ORAL | Status: DC

## 2023-04-24 MED ORDER — SODIUM CHLORIDE 0.9 % IV SOLN: 2000.0000 mg | Freq: Once | INTRAVENOUS | Status: DC

## 2023-04-24 MED ORDER — LACOSAMIDE 50 MG PO TABS: 200.0000 mg | ORAL_TABLET | Freq: Two times a day (BID) | ORAL | Status: DC

## 2023-04-24 MED ORDER — LEVETIRACETAM IN NACL 1000 MG/100ML IV SOLN
1000.0000 mg | INTRAVENOUS | Status: AC
  Administered 2023-04-24 (×2): 1000 mg via INTRAVENOUS
  Filled 2023-04-24 (×2): qty 100

## 2023-04-24 NOTE — ED Notes (Signed)
Pt verbalized understanding of discharge instructions pt ambulatory at time of discharge Iv removed pt wheeled from ed family to drive home

## 2023-04-24 NOTE — ED Notes (Signed)
Checked patient cbg it was 54

## 2023-04-24 NOTE — Discharge Instructions (Signed)
Continue your seizure precautions.  Follow-up with your neurologist.  Continue your Keppra dose 1000 mg twice daily.  Stop Vimpat.

## 2023-04-24 NOTE — Telephone Encounter (Signed)
Late entry for approx 9:00. TC from pt, reporting that she is currently in the ED at [facility] due to having a seizure this morning. She said she had two partial seizures last night as well. She states Dr. Barbaraann Cao instructed her to let him know if she had any seizures after stopping Keppra and starting  Vimpat on 10/28. Talked w/ pt's daughter also, and she reports that pt has been drowsy and more confused over the past two days. Notified Dr. Barbaraann Cao; no orders or recommendations given at this time.   Called pt back this afternoon, and she says she is still in the ED due to having another seizure there. She states she was given IV Keppra in the ED and that she wants to revert back to taking Keppra instead of Vimpat. Pt thinks she is going to be d/c'd home later this afternoon. Informed pt that Dr. Barbaraann Cao will be notified of the above information, but for now, she should follow the orders of the ED provider. She verbalizes understanding. Advised to call office for questions or concerns.

## 2023-04-24 NOTE — ED Notes (Signed)
Got patient on the monitor patient has call bell in reach and family at bedside place seizure pads on bed

## 2023-04-24 NOTE — ED Triage Notes (Addendum)
Daughter stated, she was taking Keppra and it gave her rages. Now she takes Lacosamide 100mg   and she had a seizure this morning. 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  Daughter stated, this new medicine is not working, she is still having a seizures.

## 2023-04-24 NOTE — Plan of Care (Addendum)
Discussed with Dr. Lockie Mola Recently switched from keppra to vimpat due to mood effects, with some breakthrough seizures subsequently for which she is presenting to ED, but at baseline at this time.  MRI brain recommended by Dr. Barbaraann Cao in about 3 months prior to next appointment  Recommend confirming no evidence of heart block on EKG  If EKG reassuring give Vimpat 200 mg once STAT due to missed morning dose, then increase to 150 mg BID with close follow-up with Dr. Barbaraann Cao Head CT if not at baseline or having further events to rule out acute intracranial process  Also okay to revert to keppra if patient prefers, 2 gram loading dose followed by 1 g BID  Brooke Dare MD-PhD [facility] [phone]    These are curbside recommendations based upon the information readily available in the chart on brief review as well as history and examination information provided to me by requesting provider and do not replace a full detailed consult

## 2023-04-24 NOTE — ED Provider Notes (Signed)
[place] EMERGENCY DEPARTMENT AT [facility] Provider Note   CSN: [id number] Arrival date & time: 04/24/23  4259     History  Chief Complaint  Patient presents with   Seizures    KELEN BEM is a 47 y.o. female.  The history is provided by the patient.  Seizures Seizure activity on arrival: no   Seizure type:  Focal Initial focality:  Upper extremity Episode characteristics: abnormal movements, focal shaking and generalized shaking   Episode characteristics: no apnea, no confusion, no disorientation, fully responsive and responsive   Return to baseline: yes   Severity:  Mild Timing:  Clustered Number of seizures this episode:  3 Progression:  Resolved Context comment:  Hx of sz, hx of cancer with brain mets. Was on keppra but due to mood issues with it, switch to vimpat. Recent head injury:  No recent head injuries PTA treatment:  None History of seizures: yes        Home Medications Prior to Admission medications   Medication Sig Start Date End Date Taking? Authorizing Provider  levETIRAcetam (KEPPRA) 1000 MG tablet Take 1 tablet (1,000 mg total) by mouth 2 (two) times daily. 04/24/23  Yes Lanyah Spengler, DO  LORazepam (ATIVAN) 1 MG tablet Take one po 30 min prior to mri or radiation 04/22/23   Henreitta Leber, MD      Allergies    Carboplatin, Other, and Wound dressing adhesive    Review of Systems   Review of Systems  Neurological:  Positive for seizures.    Physical Exam Updated Vital Signs BP (!) 142/92   Pulse 93   Temp 98.4 F (36.9 C)   Resp (!) 28   LMP 06/08/2020   SpO2 100%  Physical Exam Vitals and nursing note reviewed.  Constitutional:      General: She is not in acute distress.    Appearance: She is well-developed. She is not ill-appearing.  HENT:     Head: Normocephalic and atraumatic.     Nose: Nose normal.     Mouth/Throat:     Mouth: Mucous membranes are moist.  Eyes:     Extraocular Movements: Extraocular  movements intact.     Conjunctiva/sclera: Conjunctivae normal.     Pupils: Pupils are equal, round, and reactive to light.  Cardiovascular:     Rate and Rhythm: Normal rate and regular rhythm.     Pulses: Normal pulses.     Heart sounds: Normal heart sounds. No murmur heard. Pulmonary:     Effort: Pulmonary effort is normal. No respiratory distress.     Breath sounds: Normal breath sounds.  Abdominal:     Palpations: Abdomen is soft.     Tenderness: There is no abdominal tenderness.  Musculoskeletal:        General: No swelling.     Cervical back: Normal range of motion and neck supple.  Skin:    General: Skin is warm and dry.     Capillary Refill: Capillary refill takes less than 2 seconds.  Neurological:     General: No focal deficit present.     Mental Status: She is alert and oriented to person, place, and time.     Cranial Nerves: No cranial nerve deficit.     Sensory: No sensory deficit.     Motor: No weakness.     Coordination: Coordination normal.     Comments: Normal speech, normal strength and sensation  Psychiatric:        Mood and  Affect: Mood normal.     ED Results / Procedures / Treatments   Labs (all labs ordered are listed, but only abnormal results are displayed) Labs Reviewed  BASIC METABOLIC PANEL - Abnormal; Notable for the following components:      Result Value   CO2 21 (*)    Glucose, Bld 132 (*)    Calcium 8.8 (*)    All other components within normal limits  CBC WITH DIFFERENTIAL/PLATELET - Abnormal; Notable for the following components:   WBC 10.6 (*)    RBC 5.18 (*)    All other components within normal limits  CBG MONITORING, ED - Abnormal; Notable for the following components:   Glucose-Capillary 116 (*)    All other components within normal limits    EKG None  Radiology No results found.  Procedures Procedures    Medications Ordered in ED Medications  levETIRAcetam (KEPPRA) IVPB 1000 mg/100 mL premix (1,000 mg Intravenous  New Bag/Given 04/24/23 1238)    ED Course/ Medical Decision Making/ A&P Clinical Course as of 04/24/23 1305  Wed Apr 24, 2023  1028 Was called in by nursing for potential seizure. Patient with seizure like activity. Ativan ordered but, lasted around 10 seconds while I was in the room. Daughter mentioned that it was around 45 seconds. Mainly her right arm was tonic and clonic with body jerking as well. Patients eyes with central gaze. Appeared diaphoretic and post-ictal afterwards. She was not hooked up on any cardiac monitoring. Seizure precaution were ordered previously, but pads not in place. Encouraged nursing to place these immediately. Will leave this to the attending who is the primary care provider on the chart. [RR]    Clinical Course User Index [RR] Achille Rich, PA-C                                 Medical Decision Making Amount and/or Complexity of Data Reviewed Labs: ordered.  Risk Prescription drug management.   Venera Guifarro Zavalza is here with breakthrough seizures.  She is on Vimpat.  Just thought Keppra due to some irritability with Keppra.  She has been on Vimpat for 2 days and has had maybe 3 focal seizures similar to prior seizures.  History of seizures.  History of breast cancer with brain mets.  Had MRI few weeks ago that was unremarkable.  Due seizure disorder and has been doing really well on Keppra but was having some mood irritability with it and decided to try to switch to Vimpat.  She has been on that for 2 days and overall has had 3 short seizure episodes.  Mother's seizure episode in the ED.  Short lasting.  She is back at her baseline.  No falls or trauma.  This happened when she was in bed.  After talking with her and Dr. Iver Nestle neurology patient wants to go back on the Keppra and family wants to as well.  We will load her on IV Keppra 2 g.  Basic labs were obtained and are unremarkable.  Will make sure she tolerates the Keppra well.  Will put her back on 1000 mg  twice daily of Keppra.  Lab work for my review and interpretation is unremarkable.  She is done very well after getting Keppra.  She will return back to her Keppra dose 1000 mg twice daily and follow-up with her neurologist.  She understands return precautions.  Seizure precautions stressed as well.  Discharged  in good condition.  This chart was dictated using voice recognition software.  Despite best efforts to proofread,  errors can occur which can change the documentation meaning.          Final Clinical Impression(s) / ED Diagnoses Final diagnoses:  Seizure [facility])    Rx / DC Orders ED Discharge Orders          Ordered    levETIRAcetam (KEPPRA) 1000 MG tablet  2 times daily        04/24/23 1305              Virgina Norfolk, DO 04/24/23 1305

## 2023-04-25 ENCOUNTER — Telehealth: Payer: Self-pay

## 2023-04-25 ENCOUNTER — Other Ambulatory Visit: Payer: Self-pay

## 2023-04-25 NOTE — Patient Outreach (Signed)
Care Management  Transitions of Care Program Transitions of Care Post-discharge week 2   04/25/2023 Name: Sheryl Mcmillan MRN: [id number] DOB: 30-May-1976  Subjective: Sheryl Mcmillan is a 47 y.o. year old female who is a primary care patient of Sheryl Fendt, NP. The Care Management team Engaged with patient Engaged with patient by telephone to assess and address transitions of care needs.   Consent to Services: See previous visit for consent   Assessment:  Spoke with daughter who states that patient saw neurology and had a medication change. Since medication change patient had 4 seizures and was back in the ED.  Patient placed back on Keppra.  Daughter reports things are unchanged at home.  Patient continues to need increase care. Daughter reports that she has not heard from [facility] about additional hours. Reports PCP did not have any timely follow up appointments.  Interventions: I called [facility] home health and was informed that no paper work was received back from PCP.  The home health agency will call MD office again. I will also send an INBASKET message.    Reviewed with daughter about keeping patient safe.  Reviewed importance of follow up again with neurology           SDOH Interventions    Flowsheet Row Telephone from 04/15/2023 in [place] POPULATION HEALTH DEPARTMENT  SDOH Interventions   Food Insecurity Interventions Intervention Not Indicated  Transportation Interventions Intervention Not Indicated        Goals Addressed               This Visit's Progress     Patient will report no readmissions to the hospital in the next 30 days. (pt-stated)        Current Barriers:  Diet/Nutrition/Food Resources : unable to Sheryl Mcmillan for herself Home Health services needs increase in hours for PCS and or CAPS Functional/Safety Needs 24 hour supervision Inability to perform ADLS and IADLS  RNCM Clinical Goal(s):  Patient will work with the Care Management team  over the next 30 days to address Transition of Care Barriers: Diet/Nutrition/Food Resources Support at home Home Health services Functional/Safety take all medications exactly as prescribed and will call provider for medication related questions as evidenced by daughter report attend all scheduled medical appointments: PCP and specialist as evidenced by daughters report continue to work with RN Care Manager to address care management and care coordination needs related to  cancer as evidenced by adherence to CM Team Scheduled appointments through collaboration with RN Care manager, provider, and care team.   Interventions:  I called [facility] home [facility] and was informed that no paper work was received back from PCP.  The home health agency will call MD office again. I will also send an INBASKET message.    Reviewed with daughter about keeping patient safe.  Reviewed importance of follow up again with neurology   Oncology:  (Status: Goal on track:  Yes.) Short Term Goal Assessment of understanding of oncology diagnosis:  Assessed patient understanding of cancer diagnosis and recommended treatment plan  Patient Goals/Self-Care Activities: Participate in Transition of Care Program/Attend [facility] scheduled calls Notify RN Care Manager of [facility] call rescheduling needs Take all medications as prescribed Attend all scheduled provider appointments Call provider office for new concerns or questions   Follow Up Plan:  Telephone follow up appointment with care management team member scheduled for:  05/02/2023          Plan: Telephone follow up appointment with care  management team member scheduled for: 05/02/2023  Lonia Chimera, RN, BSN, CEN Population Health- Transition of Care Team.  [facility] [phone]

## 2023-04-25 NOTE — Patient Outreach (Signed)
Care Management  Transitions of Care Program Transitions of Care Post-discharge week 2   04/25/2023 Name: Sheryl Mcmillan MRN: [id number] DOB: 02-18-1976  Subjective: Sheryl Mcmillan is a 47 y.o. year old female who is a primary care patient of Rema Fendt, NP. The Care Management team Engaged with patient Engaged with patient by telephone to assess and address transitions of care needs.   Consent to Services: see previous visit for consent   Assessment:  Placed call back to daughter to provide an update on PCS services.           SDOH Interventions    Flowsheet Row Telephone from 04/15/2023 in [place] POPULATION HEALTH DEPARTMENT  SDOH Interventions   Food Insecurity Interventions Intervention Not Indicated  Transportation Interventions Intervention Not Indicated        Goals       Patient will report no readmissions to the hospital in the next 30 days. (pt-stated)      Current Barriers:  Diet/Nutrition/Food Resources : unable to Mykenzi Vanzile for herself Home Health services needs increase in hours for PCS and or CAPS Functional/Safety Needs 24 hour supervision Inability to perform ADLS and IADLS  RNCM Clinical Goal(s):  Patient will work with the Care Management team over the next 30 days to address Transition of Care Barriers: Diet/Nutrition/Food Resources Support at home Home Health services Functional/Safety take all medications exactly as prescribed and will call provider for medication related questions as evidenced by daughter report attend all scheduled medical appointments: PCP and specialist as evidenced by daughters report continue to work with RN Care Manager to address care management and care coordination needs related to  cancer as evidenced by adherence to CM Team Scheduled appointments through collaboration with RN Care manager, provider, and care team.   Interventions:  I called [facility] home [facility] and was informed that no paper work was received  back from PCP.  The home health agency will call MD office again. I will also send an INBASKET message.    Reviewed with daughter about keeping patient safe.  Reviewed importance of follow up again with neurology   Oncology:  (Status: Goal on track:  Yes.) Short Term Goal Assessment of understanding of oncology diagnosis:  Assessed patient understanding of cancer diagnosis and recommended treatment plan  Patient Goals/Self-Care Activities: Participate in Transition of Care Program/Attend [facility] scheduled calls Notify RN Care Manager of [facility] call rescheduling needs Take all medications as prescribed Attend all scheduled provider appointments Call provider office for new concerns or questions   Follow Up Plan:  Telephone follow up appointment with care management team member scheduled for:  05/02/2023            Plan: Telephone follow up appointment with care management team member scheduled for:  05/02/2023   Lonia Chimera, RN, BSN, CEN Population Health- Transition of Care Team.  [facility] [phone]

## 2023-04-29 ENCOUNTER — Ambulatory Visit: Payer: Medicaid Other | Attending: Neurosurgery | Admitting: Physical Therapy

## 2023-04-29 ENCOUNTER — Encounter: Payer: Self-pay | Admitting: Physical Therapy

## 2023-04-29 DIAGNOSIS — M6281 Muscle weakness (generalized): Secondary | ICD-10-CM | POA: Insufficient documentation

## 2023-04-29 DIAGNOSIS — R262 Difficulty in walking, not elsewhere classified: Secondary | ICD-10-CM | POA: Diagnosis present

## 2023-04-29 DIAGNOSIS — R2689 Other abnormalities of gait and mobility: Secondary | ICD-10-CM | POA: Diagnosis present

## 2023-04-29 DIAGNOSIS — R2681 Unsteadiness on feet: Secondary | ICD-10-CM | POA: Diagnosis present

## 2023-04-29 NOTE — Therapy (Signed)
OUTPATIENT PHYSICAL THERAPY NEURO TREATMENT   Patient Name: Sheryl Mcmillan MRN: [id number] DOB:[birth year], 47 y.o., female Today's Date: 04/29/2023   PCP: [name], PA-C REFERRING PROVIDER: [name], MD   PT End of Session - 04/29/23 1059     Visit Number 4    Date for PT Re-Evaluation 06/25/23    PT Start Time 1100    PT Stop Time 1145    PT Time Calculation (min) 45 min    Activity Tolerance Patient tolerated treatment well    Behavior During Therapy [facility] for tasks assessed/performed              Past Medical History:  Diagnosis Date   Arthritis    Breast cancer (HCC)    Family history of non-Hodgkin's lymphoma 06/01/2020   GERD (gastroesophageal reflux disease)    Hemorrhoids    Migraines    PCOS (polycystic ovarian syndrome)    PONV (postoperative nausea and vomiting)    Seizures (HCC)    Past Surgical History:  Procedure Laterality Date   APPLICATION OF CRANIAL NAVIGATION Left 11/10/2021   Procedure: APPLICATION OF CRANIAL NAVIGATION;  Surgeon: [name], MD;  Location: [facility] OR;  Service: Neurosurgery;  Laterality: Left;   AXILLARY SENTINEL NODE BIOPSY Left 11/07/2020   Procedure: LEFT AXILLARY SENTINEL NODE BIOPSY;  Surgeon: [name], MD;  Location: [facility] OR;  Service: General;  Laterality: Left;   BREAST CYST EXCISION Right 09/22/2021   Procedure: Excision of right axillary excess soft tissue.;  Surgeon: [name], MD;  Location: [facility];  Service: Plastics;  Laterality: Right;   BREAST RECONSTRUCTION WITH PLACEMENT OF TISSUE EXPANDER AND FLEX HD (ACELLULAR HYDRATED DERMIS) Bilateral 11/07/2020   Procedure: BILATERAL BREAST RECONSTRUCTION WITH PLACEMENT OF TISSUE EXPANDER AND FLEX HD (ACELLULAR HYDRATED DERMIS);  Surgeon: [name], MD;  Location: [facility] OR;  Service: Plastics;  Laterality: Bilateral;   COSMETIC SURGERY     CRANIOTOMY Left 11/10/2021   Procedure: LT FRONTAL CRANIOTOMY TUMOR EXCISION;  Surgeon:  [name], MD;  Location: [facility] OR;  Service: Neurosurgery;  Laterality: Left;   DILATION AND CURETTAGE OF UTERUS     HEMORRHOID SURGERY     IR IMAGING GUIDED PORT INSERTION  06/15/2020   LIPOSUCTION WITH LIPOFILLING Bilateral 09/22/2021   Procedure: Fat grafting bilateral breast;  Surgeon: [name], MD;  Location: [facility];  Service: Plastics;  Laterality: Bilateral;   MASS EXCISION Bilateral 04/25/2021   Procedure: excision of bilateral axillary breast tissue;  Surgeon: [name], MD;  Location: [facility];  Service: Plastics;  Laterality: Bilateral;   PORT-A-CATH REMOVAL Right 11/07/2020   Procedure: REMOVAL PORT-A-CATH;  Surgeon: [name], MD;  Location[facility] OR;  Service: General;  Laterality: Right;   REMOVAL OF BILATERAL TISSUE EXPANDERS WITH PLACEMENT OF BILATERAL BREAST IMPLANTS Bilateral 04/25/2021   Procedure: REMOVAL OF BILATERAL TISSUE EXPANDERS WITH PLACEMENT OF BILATERAL BREAST IMPLANTS;  Surgeon: [name], MD;  Location:  SURGERY CENTER;  Service: Plastics;  Laterality: Bilateral;   ROBOTIC ASSISTED TOTAL HYSTERECTOMY WITH BILATERAL SALPINGO OOPHERECTOMY Bilateral 03/14/2021   Procedure: XI ROBOTIC ASSISTED TOTAL HYSTERECTOMY WITH BILATERAL SALPINGO OOPHORECTOMY;  Surgeon: [name], MD;  Location: [facility];  Service: Gynecology;  Laterality: Bilateral;   TOTAL MASTECTOMY Bilateral 11/07/2020   Procedure: BILATERAL MASTECTOMY;  Surgeon: [name], MD;  Location: [facility] OR;  Service: General;  Laterality: Bilateral;   Patient Active Problem List   Diagnosis Date Noted   Leukocytosis  04/12/2023   Hyperlipidemia 04/11/2023   Seizure (HCC) 04/11/2023   Brain tumor (HCC) 11/08/2021   Metastatic cancer to brain (HCC) 11/08/2021   Cerebral edema (HCC) 11/08/2021   Complex partial seizure (HCC) 11/08/2021   Brain mass 11/07/2021   Right sided weakness 11/07/2021   IDA (iron deficiency anemia) 07/18/2021    Cholelithiasis 03/14/2021   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   Obesity, Class III, BMI 40-49.9 (morbid obesity) (HCC) 07/19/2020   BRCA1 gene mutation positive 07/04/2020   Genetic testing 06/28/2020   Family history of non-Hodgkin's lymphoma 06/01/2020   Malignant neoplasm of upper-outer quadrant of left breast in female, estrogen receptor positive (HCC) 05/26/2020   Acute right ankle pain 05/16/2016   Nondisplaced fracture of lateral malleolus of right fibula, initial encounter for closed fracture 05/16/2016    ONSET DATE: 12/05/2021 (MD referral)  REFERRING DIAG: D49.6 (ICD-10-CM) - Neoplasm of unspecified behavior of brain  THERAPY DIAG:  Muscle weakness (generalized)  Unsteadiness on feet  Difficulty in walking, not elsewhere classified  Other abnormalities of gait and mobility  Rationale for Evaluation and Treatment Rehabilitation  SUBJECTIVE:                                                                                                                                                                                              SUBJECTIVE STATEMENT: "Im ok' Its a lot of noises  Pt accompanied by: self  PERTINENT HISTORY: metastatic breast cancer, brain tumor s/p excision with craniotomy; GERD, morbid obesity.  Admitted to rehab ([facility]) 11/17/21 and d/c home 12/09/2021  PAIN:  Are you having pain? No  PRECAUTIONS: Fall and Other: no driving  FALLS: Has patient fallen in last 6 months? 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 Now s/p L frontal crani tumor excision 5/19.  COGNITION: Overall cognitive status: Within functional limits for tasks assessed   SENSATION:  Light touch: WFL  COORDINATION: Decreased coordination RLE-needs assist of LLE or UEs to place RLE  EDEMA:  none  MUSCLE TONE: RLE: Hypotonic   POSTURE: rounded shoulders  LOWER EXTREMITY ROM:     Active Right Eval Left Eval  Hip flexion    Hip extension    Hip abduction    Hip adduction    Hip internal rotation    Hip external rotation    Knee flexion    Knee extension    Ankle dorsiflexion 3   Ankle plantarflexion 30   Ankle inversion 10   Ankle eversion 0    (Blank rows = not tested)  LOWER EXTREMITY MMT:    MMT Right Eval Left Eval  Hip flexion 2+/5 4/+5  Hip extension    Hip abduction 3-/5 4/+5  Hip adduction 3+/5 4/5  Hip internal rotation    Hip external rotation    Knee flexion 3-+5 4+/5  Knee extension 3-+5 4+/5  Ankle dorsiflexion 2+ 5/5  Ankle plantarflexion 2   Ankle inversion 2+   Ankle eversion 0   (Blank rows = not tested)   TRANSFERS: Assistive device utilized: no device  Sit to stand: Independent Stand to sit: Independent  GAIT: Gait pattern: step through pattern, decreased step length- Left, decreased stance time- Right, decreased ankle dorsiflexion- Right, and genu recurvatum- Right decreased control of the right LE especially the foot Distance walked: 60 ft  Assistive device utilized:no device Level of assistance: supervision Going down staris is one at a time, has to use handrail  FUNCTIONAL TESTs:  TUG 14 seconds no device 5XSTS = 18 seconds Berg 50/56 SLS right <2 seconds, left 6 seconds DGI 10   PATIENT EDUCATION: Education details: PT POC, Eval results, Person educated: Patient Education  method: education Education comprehension: verbalized understanding and returned demonstration  Today Treatment 04/29/23 Bike L3 x 5 min  04/10/23 Nustep level 5 x 6 minutes Standing cone toe touches on and off airex Side stepping PROM/stretches of the UE's and the LE's STM to the neck and upper traps   04/01/23 NuStep L 5 x 6 min Alt 6in box taps 2x10 S2S holding yellow ball 2x10 Side step & Tamdem walking on airex in // bars  HS curls 35lb 2x10, RLE 20lb x10 Leg Ext 15lb 2x10   HOME EXERCISE PROGRAM:    GOALS: Goals reviewed with patient? Yes  SHORT TERM GOALS: Target date: 04/09/23  Pt will be independent with HEP for improved strength, balance, gait. Baseline:  Initial HEP from CIR Goal status:ongoing 04/10/23  LONG TERM GOALS: Target date: 06/18/23  Pt will be independent with HEP for improved strength, balance, transfers, and gait. Goal status: INITIAL  2.  Pt will improve 5x sit<>stand to less than or equal to 15 sec to demonstrate improved functional strength and transfer efficiency. Goal status: INITIAL  3.  Pt will improve TUG score to less than or equal to 12 sec for decreased fall risk. Goal status: INITIAL  4.  Increase Berg balance score to 53/56 Baseline: 50/56 Goal status: INITIAL  5.  Stand on the left leg 10 seconds Goal status: INITIAL  ASSESSMENT:  CLINICAL IMPRESSION: Patient is a 47 y.o. female who was seen today for physical therapy treatment for metastatic brain cancer, s/p excision with craniotomy on 11/10/2021. She has had a recent diagnosis of epilepsy. She enters reporting that's he is not sure how much she can do because of her sensitivity of environmental noise. Pt only able to tolerate aerobic warm up before expressing that she needed to to leave. Pt stated all sounds were  amplified and could not tolerate th environment.   OBJECTIVE IMPAIRMENTS Abnormal gait, decreased balance, decreased mobility, difficulty walking, decreased  ROM, decreased strength, impaired flexibility, and impaired tone.   ACTIVITY LIMITATIONS standing, transfers, locomotion level, and caring for others  PARTICIPATION LIMITATIONS: meal prep, driving, shopping, community activity, and occupation  PERSONAL FACTORS 3+ comorbidities: see above for PMH  are also affecting patient's functional outcome.   REHAB POTENTIAL: Good  CLINICAL DECISION MAKING: Evolving/moderate complexity  EVALUATION COMPLEXITY: Moderate  PLAN: PT FREQUENCY: 2x/week  PT DURATION: 12 weeks plus eval  PLANNED INTERVENTIONS: Therapeutic exercises, Therapeutic activity, Neuromuscular re-education, Balance training, Gait training, Patient/Family education, Joint mobilization, Orthotic/Fit training, DME instructions, Aquatic Therapy, and Manual therapy  PLAN FOR NEXT SESSION: see how she is doing and progress accordingly, monitor her for any seizures or other issues  Grayce Sessions, PTA 04/29/2023, 10:59 AM

## 2023-04-30 ENCOUNTER — Ambulatory Visit (HOSPITAL_COMMUNITY): Payer: Medicaid Other

## 2023-04-30 ENCOUNTER — Other Ambulatory Visit (HOSPITAL_COMMUNITY): Payer: Medicaid Other

## 2023-05-02 ENCOUNTER — Other Ambulatory Visit: Payer: Self-pay

## 2023-05-06 ENCOUNTER — Telehealth: Payer: Self-pay

## 2023-05-06 ENCOUNTER — Ambulatory Visit: Admission: RE | Admit: 2023-05-06 | Payer: Medicaid Other | Source: Ambulatory Visit | Admitting: Radiation Oncology

## 2023-05-06 ENCOUNTER — Telehealth: Payer: Self-pay | Admitting: *Deleted

## 2023-05-06 NOTE — Progress Notes (Signed)
  Care Coordination Note  05/06/2023 Name: [name] MRN: [id number] DOB: Jun 23, 1976  Sheryl Mcmillan is a 47 y.o. year old female who is a primary care patient of Rema Fendt, NP and is actively engaged with the care management team. I reached out to [employer] by phone today to assist with re-scheduling a follow up visit with the  [facility] RN  Follow up plan: Patient declines further follow up and engagement by the care management team. Appropriate care team members and provider have been notified via electronic communication.   [facility]  Care Coordination Care Guide  Direct Dial: [phone]

## 2023-05-06 NOTE — Patient Outreach (Signed)
  Care Management  Transitions of Care Program    05/06/2023 Name: Sheryl Mcmillan MRN: [id number] DOB: Jun 16, 1976  Subjective: Sheryl Mcmillan is a 47 y.o. year old female who is a primary care patient of Rema Fendt, NP. The Care Management team Engaged with patient Collaboration with care guide  to assess and address transitions of care needs.   Consent to Services:  Patient was given information about Managed Medicaid Care Management services, agreed to services, and gave verbal consent to participate.   Assessment: Patient spoke with care guide, Gwenevere Ghazi and did not wish to have any additional calls.    Case Closed.  Lonia Chimera, RN, BSN, CEN [employer]- Transition of Care Team.  [facility] [phone]

## 2023-05-07 ENCOUNTER — Ambulatory Visit: Payer: Medicaid Other | Admitting: Physical Therapy

## 2023-05-07 ENCOUNTER — Inpatient Hospital Stay: Payer: Medicaid Other | Admitting: Family

## 2023-05-10 ENCOUNTER — Telehealth: Payer: Self-pay | Admitting: Family

## 2023-05-13 ENCOUNTER — Other Ambulatory Visit (HOSPITAL_COMMUNITY): Payer: Self-pay

## 2023-05-14 ENCOUNTER — Other Ambulatory Visit: Payer: Self-pay | Admitting: Internal Medicine

## 2023-05-14 MED ORDER — LEVETIRACETAM 1000 MG PO TABS: 1000.0000 mg | ORAL_TABLET | Freq: Two times a day (BID) | ORAL | 3 refills | Status: DC

## 2023-06-07 ENCOUNTER — Telehealth: Payer: Self-pay

## 2023-06-07 NOTE — Telephone Encounter (Signed)
TC from pt, wanting to set up her MRI of the brain ordered by Dr. Barbaraann Cao in October. She has not heard from anyone in scheduling. Called CS and appt made for 07/08/23 at 2:00 at [facility] (entrance A). Pt aware of this and to arrive 30 mins early. Pt states she is claustrophobic, but she takes a pre-medication ordered by Dr. Barbaraann Cao and says she has this medication already. No further questions or concerns.

## 2023-07-03 ENCOUNTER — Ambulatory Visit (HOSPITAL_COMMUNITY)
Admission: RE | Admit: 2023-07-03 | Discharge: 2023-07-03 | Disposition: A | Discharge status: Home / Self Care | Payer: Medicaid Other | Source: Ambulatory Visit | Attending: Hematology and Oncology | Admitting: Hematology and Oncology

## 2023-07-03 DIAGNOSIS — C50412 Malignant neoplasm of upper-outer quadrant of left female breast: Secondary | ICD-10-CM | POA: Diagnosis present

## 2023-07-03 DIAGNOSIS — Z170 Estrogen receptor positive status [ER+]: Secondary | ICD-10-CM | POA: Insufficient documentation

## 2023-07-03 MED ORDER — IOHEXOL 300 MG/ML  SOLN
100.0000 mL | Freq: Once | INTRAMUSCULAR | Status: AC | PRN
  Administered 2023-07-03: 100 mL via INTRAVENOUS

## 2023-07-08 ENCOUNTER — Ambulatory Visit (HOSPITAL_COMMUNITY)
Admission: RE | Admit: 2023-07-08 | Discharge: 2023-07-08 | Disposition: A | Discharge status: Home / Self Care | Payer: Medicaid Other | Source: Ambulatory Visit | Attending: Internal Medicine | Admitting: Internal Medicine

## 2023-07-08 ENCOUNTER — Other Ambulatory Visit: Payer: Self-pay | Admitting: Family

## 2023-07-08 DIAGNOSIS — C7931 Secondary malignant neoplasm of brain: Secondary | ICD-10-CM

## 2023-07-08 DIAGNOSIS — K219 Gastro-esophageal reflux disease without esophagitis: Secondary | ICD-10-CM

## 2023-07-08 MED ORDER — GADOBUTROL 1 MMOL/ML IV SOLN
10.0000 mL | Freq: Once | INTRAVENOUS | Status: AC | PRN
  Administered 2023-07-08: 10 mL via INTRAVENOUS

## 2023-07-11 ENCOUNTER — Inpatient Hospital Stay: Payer: Medicaid Other | Attending: Hematology and Oncology | Admitting: Hematology and Oncology

## 2023-07-11 VITALS — BP 122/90 | HR 91 | Temp 97.9°F | Resp 18 | Ht 71.0 in | Wt 304.6 lb

## 2023-07-11 DIAGNOSIS — C7931 Secondary malignant neoplasm of brain: Secondary | ICD-10-CM | POA: Insufficient documentation

## 2023-07-11 DIAGNOSIS — Z90722 Acquired absence of ovaries, bilateral: Secondary | ICD-10-CM | POA: Insufficient documentation

## 2023-07-11 DIAGNOSIS — Z90710 Acquired absence of both cervix and uterus: Secondary | ICD-10-CM | POA: Diagnosis not present

## 2023-07-11 DIAGNOSIS — Z808 Family history of malignant neoplasm of other organs or systems: Secondary | ICD-10-CM | POA: Insufficient documentation

## 2023-07-11 DIAGNOSIS — R569 Unspecified convulsions: Secondary | ICD-10-CM | POA: Diagnosis not present

## 2023-07-11 DIAGNOSIS — Z807 Family history of other malignant neoplasms of lymphoid, hematopoietic and related tissues: Secondary | ICD-10-CM | POA: Diagnosis not present

## 2023-07-11 DIAGNOSIS — L989 Disorder of the skin and subcutaneous tissue, unspecified: Secondary | ICD-10-CM

## 2023-07-11 DIAGNOSIS — Z87891 Personal history of nicotine dependence: Secondary | ICD-10-CM | POA: Diagnosis not present

## 2023-07-11 DIAGNOSIS — Z170 Estrogen receptor positive status [ER+]: Secondary | ICD-10-CM | POA: Insufficient documentation

## 2023-07-11 DIAGNOSIS — Z9013 Acquired absence of bilateral breasts and nipples: Secondary | ICD-10-CM | POA: Insufficient documentation

## 2023-07-11 DIAGNOSIS — Z9079 Acquired absence of other genital organ(s): Secondary | ICD-10-CM | POA: Insufficient documentation

## 2023-07-11 DIAGNOSIS — C50412 Malignant neoplasm of upper-outer quadrant of left female breast: Secondary | ICD-10-CM | POA: Diagnosis present

## 2023-07-11 NOTE — Assessment & Plan Note (Signed)
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 CT CAP 05/18/22: mild non specific inc in para tracheal LN Nonspecific, stable 5 mm LUL nodule Bone scan: 05/18/22: L2 compression fracture, no bone mets CT chest done 08/22/2022: No evidence of metastatic disease, fatty liver Brain MRI 06/29/2022: New 2 mm metastasis right frontal lobe (SRS on 07/10/2022) 02/28/2023: CT CAP: No evidence of metastatic disease in CAP   CT scans in 4 months and follow-up after that to discuss results Toxicities: Muscle cramps and spasms Low back pain

## 2023-07-11 NOTE — Progress Notes (Signed)
Patient Care Team: Rema Fendt, NP as PCP - General (Nurse Practitioner) [name], MD as Consulting Physician (General Surgery) Antony Blackbird, MD as Consulting Physician (Radiation Oncology) [name], MD as Consulting Physician (Plastic Surgery) Serena Croissant, MD as Consulting Physician (Hematology and Oncology) [name], MD as Consulting Physician (Neurosurgery)  DIAGNOSIS:  Encounter Diagnoses  Name Primary?   Malignant neoplasm of upper-outer quadrant of left breast in female, estrogen receptor positive (HCC) Yes   Skin abnormality     SUMMARY OF ONCOLOGIC HISTORY: Oncology History  Malignant neoplasm of upper-outer quadrant of left breast in female, estrogen receptor positive (HCC)  05/26/2020 Initial Diagnosis   Malignant neoplasm of upper-outer quadrant of left breast in female, estrogen receptor positive (HCC)   06/01/2020 Cancer Staging   Staging form: Breast, AJCC 8th Edition - Clinical stage from 06/01/2020: Stage IIA (cT2, cN0, cM0, G3, ER+, PR-, HER2+) - Signed by Rachel Moulds, MD on 11/21/2022 Stage prefix: Initial diagnosis Histologic grading system: 3 grade system   06/16/2020 - 09/30/2020 Chemotherapy      Patient is on Antibody Plan: BREAST TRASTUZUMAB Q21D    06/16/2020 Genetic Testing   Positive genetic testing: 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  Variant of uncertain significance in MSH3 at c.2724A>G (Silent).  No other pathogenic or uncertain variants were reported in the [facility] Multi-Cancer Panel.  The report date is June 16, 2020.    The variant of uncertain significance (VUS) in MSH3 at c.2724A>G (Silent) has been reclassified to likely benign.  The change in variant classification was made as a result of re-review of evidence in light of new variant interpretation guidelines and/or new information. The amended report date is January 10, 2021.   The Multi-Cancer Panel offered by [facility] includes  sequencing and/or deletion duplication testing of the following 85 genes: AIP, ALK, APC, ATM, AXIN2,BAP1,  BARD1, BLM, BMPR1A, BRCA1, BRCA2, BRIP1, CASR, CDC73, CDH1, CDK4, CDKN1B, CDKN1C, CDKN2A (p14ARF), CDKN2A (p16INK4a), CEBPA, CHEK2, CTNNA1, DICER1, DIS3L2, EGFR (c.2369C>T, p.Thr790Met variant only), EPCAM (Deletion/duplication testing only), FH, FLCN, GATA2, GPC3, GREM1 (Promoter region deletion/duplication testing only), HOXB13 (c.251G>A, p.Gly84Glu), HRAS, KIT, MAX, MEN1, MET, MITF (c.952G>A, p.Glu318Lys variant only), MLH1, MSH2, MSH3, MSH6, MUTYH, NBN, NF1, NF2, NTHL1, PALB2, PDGFRA, PHOX2B, PMS2, POLD1, POLE, POT1, PRKAR1A, PTCH1, PTEN, RAD50, RAD51C, RAD51D, RB1, RECQL4, RET, RNF43, RUNX1, SDHAF2, SDHA (sequence changes only), SDHB, SDHC, SDHD, SMAD4, SMARCA4, SMARCB1, SMARCE1, STK11, SUFU, TERC, TERT, TMEM127, TP53, TSC1, TSC2, VHL, WRN and WT1.    10/20/2020 - 10/20/2020 Chemotherapy    Patient is on Treatment Plan: BREAST  DOCETAXEL + CARBOPLATIN + TRASTUZUMAB + PERTUZUMAB  (TCHP) Q21D       10/20/2020 - 06/23/2021 Chemotherapy   Patient is on Treatment Plan : BREAST Trastuzumab q21d     11/10/2021 - 11/10/2021 Radiation Therapy   Site Technique Total Dose (Gy) Dose per Fx (Gy) Completed Fx Beam Energies  Brain: Brain [id number] IMRT 18/18 18 1/1 6XFFF       CHIEF COMPLIANT: Follow-up after recent scans  HISTORY OF PRESENT ILLNESS: Ms. Sheryl Mcmillan is a 48 year old with above-mentioned history of metastatic breast cancer with brain metastases status post radiation.  She has not had any systemic recurrence.  Recent CT scans showed no evidence of systemic recurrence.  Brain MRI results are pending.        ALLERGIES:  is allergic to carboplatin, other, and wound dressing adhesive.  MEDICATIONS:  Current Outpatient Medications  Medication Sig Dispense Refill   levETIRAcetam (KEPPRA) 1000  MG tablet Take 1 tablet (1,000 mg total) by mouth 2 (two) times daily. 60 tablet 3    LORazepam (ATIVAN) 1 MG tablet Take one po 30 min prior to mri or radiation 7 tablet 0   No current facility-administered medications for this visit.    PHYSICAL EXAMINATION: ECOG PERFORMANCE STATUS: 1 - Symptomatic but completely ambulatory  Vitals:   07/11/23 1509  BP: (!) 122/90  Pulse: 91  Resp: 18  Temp: 97.9 F (36.6 C)  SpO2: 100%   Filed Weights   07/11/23 1509  Weight: (!) 304 lb 9.6 oz (138.2 kg)    Physical Exam          (exam performed in the presence of a chaperone)  LABORATORY DATA:  I have reviewed the data as listed    Latest Ref Rng & Units 04/24/2023    9:03 AM 04/11/2023    3:09 PM 04/10/2023    9:50 AM  CMP  Glucose 70 - 99 mg/dL 409  811  914   BUN 6 - 20 mg/dL 8  9  10    Creatinine 0.44 - 1.00 mg/dL 7.82  9.56  2.13   Sodium 135 - 145 mmol/L 140  138  145   Potassium 3.5 - 5.1 mmol/L 4.3  4.2  4.8   Chloride 98 - 111 mmol/L 109  100  107   CO2 22 - 32 mmol/L 21  24  20    Calcium 8.9 - 10.3 mg/dL 8.8  9.1  9.4   Total Protein 6.0 - 8.5 g/dL   6.5   Total Bilirubin 0.0 - 1.2 mg/dL   0.2   Alkaline Phos 44 - 121 IU/L   126   AST 0 - 40 IU/L   23   ALT 0 - 32 IU/L   29     Lab Results  Component Value Date   WBC 10.6 (H) 04/24/2023   HGB 14.8 04/24/2023   HCT 44.9 04/24/2023   MCV 86.7 04/24/2023   PLT 319 04/24/2023   NEUTROABS 6.5 04/24/2023    ASSESSMENT & PLAN:  Malignant neoplasm of upper-outer quadrant of left breast in female, estrogen receptor positive (HCC) 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, PR negative, 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 CT CAP 05/18/22: mild non specific inc in para tracheal LN Nonspecific, stable 5 mm LUL nodule Bone scan: 05/18/22: L2 compression fracture, no bone mets CT chest done 08/22/2022: No evidence of metastatic disease, fatty liver Brain MRI 06/29/2022: New 2 mm metastasis right frontal lobe (SRS on 07/10/2022) 02/28/2023: CT CAP: No evidence of metastatic disease in CAP   CT scans in 4 months and follow-up after that to discuss results Brain MRI: Results pending Patient follows with Dr. Barbaraann Cao  We will plan to do scans every 4 months.   Orders Placed This Encounter  Procedures   CT CHEST ABDOMEN PELVIS W CONTRAST    Standing Status:   Future    Expiration Date:   07/10/2024    If indicated for the ordered procedure, I authorize  the administration of contrast media per Radiology protocol:   Yes    Does the patient have a contrast media/X-ray dye allergy?:   No    Is patient pregnant?:   No    Preferred imaging location?:   [facility]    Release to patient:   Immediate    If indicated for the ordered procedure, I authorize the administration of oral contrast media per Radiology protocol:   Yes   CBC with Differential (Cancer Center Only)    Standing Status:   Future    Expiration Date:   07/10/2024   CMP (Cancer Center only)    Standing Status:   Future    Expiration Date:   07/10/2024   Ambulatory referral to Dermatology    Referral Priority:   Routine    Referral Type:   Consultation    Referral Reason:   Specialty Services Required    Referred to Provider:   Terri Piedra, DO    Requested Specialty:   Dermatology    Number of Visits Requested:   1   The patient has a good understanding of the overall plan. she agrees with it. she will call with any problems that may develop before  the next visit here. Total time spent: 30 mins including face to face time and time spent for planning, charting and co-ordination of care   Tamsen Meek, MD 07/11/23

## 2023-07-22 ENCOUNTER — Telehealth: Payer: Self-pay | Admitting: Internal Medicine

## 2023-07-22 ENCOUNTER — Inpatient Hospital Stay (HOSPITAL_BASED_OUTPATIENT_CLINIC_OR_DEPARTMENT_OTHER): Payer: Medicaid Other | Admitting: Internal Medicine

## 2023-07-22 VITALS — BP 129/86 | HR 90 | Temp 97.9°F | Resp 18 | Ht 71.0 in | Wt 305.1 lb

## 2023-07-22 DIAGNOSIS — Z807 Family history of other malignant neoplasms of lymphoid, hematopoietic and related tissues: Secondary | ICD-10-CM

## 2023-07-22 DIAGNOSIS — C50412 Malignant neoplasm of upper-outer quadrant of left female breast: Secondary | ICD-10-CM

## 2023-07-22 DIAGNOSIS — R569 Unspecified convulsions: Secondary | ICD-10-CM

## 2023-07-22 DIAGNOSIS — Z170 Estrogen receptor positive status [ER+]: Secondary | ICD-10-CM | POA: Diagnosis not present

## 2023-07-22 DIAGNOSIS — C7931 Secondary malignant neoplasm of brain: Secondary | ICD-10-CM

## 2023-07-22 DIAGNOSIS — Z808 Family history of malignant neoplasm of other organs or systems: Secondary | ICD-10-CM

## 2023-07-22 DIAGNOSIS — Z87891 Personal history of nicotine dependence: Secondary | ICD-10-CM

## 2023-07-22 MED ORDER — LEVETIRACETAM 1000 MG PO TABS: 1000.0000 mg | ORAL_TABLET | Freq: Two times a day (BID) | ORAL | 5 refills | Status: DC

## 2023-07-22 NOTE — Telephone Encounter (Signed)
Sheryl Mcmillan

## 2023-07-22 NOTE — Progress Notes (Signed)
[facility] [address]  [place], [place] [address] [phone]   Interval Evaluation  Date of Service: 07/22/23 Patient Name: Sheryl Mcmillan Patient MRN: [id number] Patient DOB: August 03, 1975 Provider: Henreitta Leber, MD  Identifying Statement:  Sheryl Mcmillan is a 48 y.o. female with Metastatic cancer to brain [facility])  Seizure (HCC)   Primary Cancer:  Oncologic History: Oncology History  Malignant neoplasm of upper-outer quadrant of left breast in female, estrogen receptor positive (HCC)  05/26/2020 Initial Diagnosis   Malignant neoplasm of upper-outer quadrant of left breast in female, estrogen receptor positive (HCC)   06/01/2020 Cancer Staging   Staging form: Breast, AJCC 8th Edition - Clinical stage from 06/01/2020: Stage IIA (cT2, cN0, cM0, G3, ER+, PR-, HER2+) - Signed by Rachel Moulds, MD on 11/21/2022 Stage prefix: Initial diagnosis Histologic grading system: 3 grade system   06/16/2020 - 09/30/2020 Chemotherapy      Patient is on Antibody Plan: BREAST TRASTUZUMAB Q21D    06/16/2020 Genetic Testing   Positive genetic testing: 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  Variant of uncertain significance in MSH3 at c.2724A>G (Silent).  No other pathogenic or uncertain variants were reported in the [facility] Multi-Cancer Panel.  The report date is June 16, 2020.    The variant of uncertain significance (VUS) in MSH3 at c.2724A>G (Silent) has been reclassified to likely benign.  The change in variant classification was made as a result of re-review of evidence in light of new variant interpretation guidelines and/or new information. The amended report date is January 10, 2021.   The Multi-Cancer Panel offered by [facility] includes sequencing and/or deletion duplication testing of the following 85 genes: AIP, ALK, APC, ATM, AXIN2,BAP1,  BARD1, BLM, BMPR1A, BRCA1, BRCA2, BRIP1, CASR, CDC73, CDH1, CDK4, CDKN1B, CDKN1C,  CDKN2A (p14ARF), CDKN2A (p16INK4a), CEBPA, CHEK2, CTNNA1, DICER1, DIS3L2, EGFR (c.2369C>T, p.Thr790Met variant only), EPCAM (Deletion/duplication testing only), FH, FLCN, GATA2, GPC3, GREM1 (Promoter region deletion/duplication testing only), HOXB13 (c.251G>A, p.Gly84Glu), HRAS, KIT, MAX, MEN1, MET, MITF (c.952G>A, p.Glu318Lys variant only), MLH1, MSH2, MSH3, MSH6, MUTYH, NBN, NF1, NF2, NTHL1, PALB2, PDGFRA, PHOX2B, PMS2, POLD1, POLE, POT1, PRKAR1A, PTCH1, PTEN, RAD50, RAD51C, RAD51D, RB1, RECQL4, RET, RNF43, RUNX1, SDHAF2, SDHA (sequence changes only), SDHB, SDHC, SDHD, SMAD4, SMARCA4, SMARCB1, SMARCE1, STK11, SUFU, TERC, TERT, TMEM127, TP53, TSC1, TSC2, VHL, WRN and WT1.    10/20/2020 - 10/20/2020 Chemotherapy    Patient is on Treatment Plan: BREAST  DOCETAXEL + CARBOPLATIN + TRASTUZUMAB + PERTUZUMAB  (TCHP) Q21D       10/20/2020 - 06/23/2021 Chemotherapy   Patient is on Treatment Plan : BREAST Trastuzumab q21d     11/10/2021 - 11/10/2021 Radiation Therapy   Site Technique Total Dose (Gy) Dose per Fx (Gy) Completed Fx Beam Energies  Brain: Brain [id number] IMRT 18/18 18 1/1 6XFFF      CNS Oncologic History 11/10/21: Pre-op SRS L frontal (Moody) 11/12/21: L frontal craniotomy, resection [name]) 07/10/22: Salvage SRS R frontal Mitzi Hansen)  Interval History: Sheryl Mcmillan presents today for follow up after recent MRI brain.  Remains on Keppra 1000mg  twice per day without issue, after not tolerating the Vimpat switch.  Continues to follow with Dr. Pamelia Hoit for breast cancer.  Prior (04/22/23): recent admission for seizures. Initially presented to neurologic attention on 04/07/23 while visiting family in [place], described episode of right sided shaking, followed by weakness and confusion.  She was started on Keppra, but the event recurred after returning to [facility].  Following an  admission here, Keppra was increased to 1000mg  twice per day.  No recurrence of events since discharge ~10 days  ago, but there have been significant mood effects.  She has been very irritable and lashing out at family.  Denies headaches.  Prior: Patient presents today following recent MRI brain.  She describes some periods of word finding difficulty.  This has improved since she stopped working and lowered her stress level.  No other new or progressive changes.  Denies seizures, headaches.  Medications: Current Outpatient Medications on File Prior to Visit  Medication Sig Dispense Refill   levETIRAcetam (KEPPRA) 1000 MG tablet Take 1 tablet (1,000 mg total) by mouth 2 (two) times daily. 60 tablet 3   LORazepam (ATIVAN) 1 MG tablet Take one po 30 min prior to mri or radiation 7 tablet 0   No current facility-administered medications on file prior to visit.    Allergies:  Allergies  Allergen Reactions   Carboplatin Hives and Itching    Carboplatin reaction after dose #5 requiring Benadryl, Solumedrol, epinephrine, Claritin.   Other Hives    Baking Soda   Wound Dressing Adhesive Hives    Rips skin, prefers paper tape   Past Medical History:  Past Medical History:  Diagnosis Date   Arthritis    Breast cancer (HCC)    Family history of non-Hodgkin's lymphoma 06/01/2020   GERD (gastroesophageal reflux disease)    Hemorrhoids    Migraines    PCOS (polycystic ovarian syndrome)    PONV (postoperative nausea and vomiting)    Seizures (HCC)    Past Surgical History:  Past Surgical History:  Procedure Laterality Date   APPLICATION OF CRANIAL NAVIGATION Left 11/10/2021   Procedure: APPLICATION OF CRANIAL NAVIGATION;  Surgeon: [name], MD;  Location: [facility] OR;  Service: Neurosurgery;  Laterality: Left;   AXILLARY SENTINEL NODE BIOPSY Left 11/07/2020   Procedure: LEFT AXILLARY SENTINEL NODE BIOPSY;  Surgeon: [name], MD;  Location: [facility] OR;  Service: General;  Laterality: Left;   BREAST CYST EXCISION Right 09/22/2021   Procedure: Excision of right axillary excess soft tissue.;   Surgeon: [name], MD;  Location: [facility];  Service: Plastics;  Laterality: Right;   BREAST RECONSTRUCTION WITH PLACEMENT OF TISSUE EXPANDER AND FLEX HD (ACELLULAR HYDRATED DERMIS) Bilateral 11/07/2020   Procedure: BILATERAL BREAST RECONSTRUCTION WITH PLACEMENT OF TISSUE EXPANDER AND FLEX HD (ACELLULAR HYDRATED DERMIS);  Surgeon: [name], MD;  Location: [facility] OR;  Service: Plastics;  Laterality: Bilateral;   COSMETIC SURGERY     CRANIOTOMY Left 11/10/2021   Procedure: LT FRONTAL CRANIOTOMY TUMOR EXCISION;  Surgeon: [name], MD;  Location: [facility] OR;  Service: Neurosurgery;  Laterality: Left;   DILATION AND CURETTAGE OF UTERUS     HEMORRHOID SURGERY     IR IMAGING GUIDED PORT INSERTION  06/15/2020   LIPOSUCTION WITH LIPOFILLING Bilateral 09/22/2021   Procedure: Fat grafting bilateral breast;  Surgeon: [name], MD;  Location: [facility];  Service: Plastics;  Laterality: Bilateral;   MASS EXCISION Bilateral 04/25/2021   Procedure: excision of bilateral axillary breast tissue;  Surgeon: [name], MD;  Location: [facility];  Service: Plastics;  Laterality: Bilateral;   PORT-A-CATH REMOVAL Right 11/07/2020   Procedure: REMOVAL PORT-A-CATH;  Surgeon: [name], MD;  Location[facility];  Service: General;  Laterality: Right;   REMOVAL OF BILATERAL TISSUE EXPANDERS WITH PLACEMENT OF BILATERAL BREAST IMPLANTS Bilateral 04/25/2021   Procedure: REMOVAL OF BILATERAL TISSUE EXPANDERS  WITH PLACEMENT OF BILATERAL BREAST IMPLANTS;  Surgeon: [name], MD;  Location: [facility];  Service: Plastics;  Laterality: Bilateral;   ROBOTIC ASSISTED TOTAL HYSTERECTOMY WITH BILATERAL SALPINGO OOPHERECTOMY Bilateral 03/14/2021   Procedure: XI ROBOTIC ASSISTED TOTAL HYSTERECTOMY WITH BILATERAL SALPINGO OOPHORECTOMY;  Surgeon: [name], MD;  Location: [facility];  Service: Gynecology;  Laterality: Bilateral;   TOTAL MASTECTOMY  Bilateral 11/07/2020   Procedure: BILATERAL MASTECTOMY;  Surgeon: [name], MD;  Location: [facility] OR;  Service: General;  Laterality: Bilateral;   Social History:  Social History   Socioeconomic History   Marital status: Single    Spouse name: Not on file   Number of children: 1   Years of education: Not on file   Highest education level: Some college, no degree  Occupational History   Not on file  Tobacco Use   Smoking status: Former    Current packs/day: 0.00    Average packs/day: 0.5 packs/day for 10.0 years (5.0 ttl pk-yrs)    Types: Cigarettes    Start date: 08/24/2010    Quit date: 08/23/2020    Years since quitting: 2.9    Passive exposure: Never   Smokeless tobacco: Never  Vaping Use   Vaping status: Never Used  Substance and Sexual Activity   Alcohol use: Not Currently    Comment: rare   Drug use: Yes    Types: Marijuana    Comment: Last use 11/07/21   Sexual activity: Yes    Birth control/protection: Surgical    Comment: Hysterectomy  Other Topics Concern   Not on file  Social History Narrative   Not on file   Social Drivers of Health   Financial Resource Strain: Not on file  Food Insecurity: No Food Insecurity (04/15/2023)   Hunger Vital Sign    Worried About Running Out of Food in the Last Year: Never true    Ran Out of Food in the Last Year: Never true  Transportation Needs: No Transportation Needs (04/15/2023)   PRAPARE - Administrator, Civil Service (Medical): No    Lack of Transportation (Non-Medical): No  Physical Activity: Not on file  Stress: Not on file  Social Connections: Not on file  Intimate Partner Violence: Not At Risk (04/11/2023)   Humiliation, Afraid, Rape, and Kick questionnaire    Fear of Current or Ex-Partner: No    Emotionally Abused: No    Physically Abused: No    Sexually Abused: No   Family History:  Family History  Problem Relation Age of Onset   Hypertension Mother    Diabetes Mother    Cancer Mother  37       unknown primary   Other Mother        brain tumor; dx 13s   Non-Hodgkin's lymphoma Brother 83   Other Maternal Uncle 29       brain tumor   Breast cancer Other        MGM's niece; dx late 71s   Pancreatic cancer Neg Hx    Colon cancer Neg Hx    Endometrial cancer Neg Hx    Prostate cancer Neg Hx    Ovarian cancer Neg Hx     Review of Systems: Constitutional: Doesn't report fevers, chills or abnormal weight loss Eyes: Doesn't report blurriness of vision Ears, nose, mouth, throat, and face: Doesn't report sore throat Respiratory: Doesn't report cough, dyspnea or wheezes Cardiovascular: Doesn't report palpitation, chest discomfort  Gastrointestinal:  Doesn't report nausea, constipation,  diarrhea GU: Doesn't report incontinence Skin: Doesn't report skin rashes Neurological: Per HPI Musculoskeletal: Doesn't report joint pain Behavioral/Psych: Doesn't report anxiety  Physical Exam: Vitals:   07/22/23 1121  BP: 129/86  Pulse: 90  Resp: 18  Temp: 97.9 F (36.6 C)  SpO2: 100%    KPS: 90. General: Alert, cooperative, pleasant, in no acute distress Head: Normal EENT: No conjunctival injection or scleral icterus.  Lungs: Resp effort normal Cardiac: Regular rate Abdomen: Non-distended abdomen Skin: No rashes cyanosis or petechiae. Extremities: No clubbing or edema  Neurologic Exam: Mental Status: Awake, alert, attentive to examiner. Oriented to self and environment. Language is fluent with intact comprehension.  Cranial Nerves: Visual acuity is grossly normal. Visual fields are full. Extra-ocular movements intact. No ptosis. Face is symmetric Motor: Tone and bulk are normal. Power is full in both arms and legs. Reflexes are symmetric, no pathologic reflexes present.  Sensory: Intact to light touch Gait: Normal.   Labs: I have reviewed the data as listed    Component Value Date/Time   NA 140 04/24/2023 0903   NA 145 (H) 04/10/2023 0950   K 4.3 04/24/2023  0903   CL 109 04/24/2023 0903   CO2 21 (L) 04/24/2023 0903   GLUCOSE 132 (H) 04/24/2023 0903   BUN 8 04/24/2023 0903   BUN 10 04/10/2023 0950   CREATININE 0.74 04/24/2023 0903   CREATININE 0.63 07/05/2022 1158   CALCIUM 8.8 (L) 04/24/2023 0903   PROT 6.5 04/10/2023 0950   ALBUMIN 4.2 04/10/2023 0950   AST 23 04/10/2023 0950   AST 16 10/17/2021 1112   ALT 29 04/10/2023 0950   ALT 21 10/17/2021 1112   ALKPHOS 126 (H) 04/10/2023 0950   BILITOT 0.2 04/10/2023 0950   BILITOT 0.3 10/17/2021 1112   GFRNONAA >60 04/24/2023 0903   GFRNONAA >60 07/05/2022 1158   GFRAA >60 06/06/2019 1205   Lab Results  Component Value Date   WBC 10.6 (H) 04/24/2023   NEUTROABS 6.5 04/24/2023   HGB 14.8 04/24/2023   HCT 44.9 04/24/2023   MCV 86.7 04/24/2023   PLT 319 04/24/2023    Imaging:  MR BRAIN W WO CONTRAST Result Date: 07/15/2023 CLINICAL DATA:  Brain/CNS neoplasm, assess treatment response. History of metastatic breast cancer. EXAM: MRI HEAD WITHOUT AND WITH CONTRAST TECHNIQUE: Multiplanar, multiecho pulse sequences of the brain and surrounding structures were obtained without and with intravenous contrast. CONTRAST:  10mL GADAVIST GADOBUTROL 1 MMOL/ML IV SOLN COMPARISON:  Head MRI 04/11/2023 FINDINGS: Brain: Encephalomalacia and a small amount of chronic blood products are again noted at the left frontoparietal resection site. A small amount of nonsuspicious enhancement at the resection site is unchanged. No new enhancing intracranial lesion is identified. The ventricles are normal in size. No acute infarct, midline shift, or extra-axial fluid collection is evident. Vascular: Major intracranial vascular flow voids are preserved. Skull and upper cervical spine: Left frontoparietal vertex craniotomy. No suspicious marrow lesion. Sinuses/Orbits: Unremarkable orbits. Unchanged left sphenoid sinus mucous retention cyst. Clear mastoid air cells. Other: None. IMPRESSION: Unchanged left frontoparietal  resection site. No evidence of new or progressive intracranial metastatic disease. Electronically Signed   By: Sebastian Ache M.D.   On: 07/15/2023 11:08   CT CHEST ABDOMEN PELVIS W CONTRAST Result Date: 07/05/2023 CLINICAL DATA:  Metastatic breast cancer restaging * Tracking Code: BO * EXAM: CT CHEST, ABDOMEN, AND PELVIS WITH CONTRAST TECHNIQUE: Multidetector CT imaging of the chest, abdomen and pelvis was performed following the standard protocol during bolus administration of  intravenous contrast. RADIATION DOSE REDUCTION: This exam was performed according to the departmental dose-optimization program which includes automated exposure control, adjustment of the mA and/or kV according to patient size and/or use of iterative reconstruction technique. CONTRAST:  OMNIPAQUE IOHEXOL 300 MG/ML  SOLN COMPARISON:  02/28/2023 FINDINGS: CT CHEST FINDINGS Cardiovascular: No significant vascular findings. Normal heart size. No pericardial effusion. Mediastinum/Nodes: No enlarged mediastinal, hilar, or axillary lymph nodes. Thymic remnant in the anterior mediastinum. Thyroid gland, trachea, and esophagus demonstrate no significant findings. Lungs/Pleura: Unchanged 0.4 cm nodule of the posterior left apex (series 4, image 30). No pleural effusion or pneumothorax. Musculoskeletal: Bilateral mastectomy with implant reconstruction. No acute osseous findings. CT ABDOMEN PELVIS FINDINGS Hepatobiliary: No solid liver abnormality is seen. Hepatomegaly, maximum coronal span 22.4 cm. Hepatic steatosis. Status post cholecystectomy. No biliary ductal dilatation. Pancreas: Unremarkable. No pancreatic ductal dilatation or surrounding inflammatory changes. Spleen: Normal in size without significant abnormality. Adrenals/Urinary Tract: Adrenal glands are unremarkable. Kidneys are normal, without renal calculi, solid lesion, or hydronephrosis. Bladder is unremarkable. Stomach/Bowel: Stomach is within normal limits. Appendix appears  normal. No evidence of bowel wall thickening, distention, or inflammatory changes. Vascular/Lymphatic: Scattered aortic atherosclerosis. No enlarged abdominal or pelvic lymph nodes. Reproductive: Status post hysterectomy. Other: No abdominal wall hernia or abnormality. No ascites. Musculoskeletal: No acute osseous findings. Chronic bilateral pars defects of L5. IMPRESSION: 1. No evidence of recurrent or metastatic disease in the chest, abdomen, or pelvis. 2. Unchanged 0.4 cm nodule of the posterior left apex, most likely benign and incidental sequelae of prior infection or inflammation. 3. Hepatomegaly and hepatic steatosis. 4. Status post cholecystectomy and hysterectomy. Aortic Atherosclerosis (ICD10-I70.0). Electronically Signed   By: Jearld Lesch M.D.   On: 07/05/2023 17:26    [facility] Clinician Interpretation: I have personally reviewed the radiological images as listed.  My interpretation, in the context of the patient's clinical presentation, is stable disease   Assessment/Plan Metastatic cancer to brain [facility])  Seizure (HCC)  Sheryl Mcmillan is clinically stable today.  MRI brain continues to demonstrate stable findings, now year s/p salvage R frontal SRS.   For seizures, ok to con't Keppra 1000mg  BID.  We appreciate the opportunity to participate in the care of [facility].   We ask that [name] return to clinic in 4 months following next brain MRI, or sooner as needed.  All questions were answered. The patient knows to call the clinic with any problems, questions or concerns. No barriers to learning were detected.  The total time spent in the encounter was 40 minutes and more than 50% was on counseling and review of test results   Henreitta Leber, MD Medical Director of Neuro-Oncology [facility] 07/22/23 11:26 AM

## 2023-08-02 ENCOUNTER — Institutional Professional Consult (permissible substitution): Payer: Medicaid Other | Admitting: Plastic Surgery

## 2023-08-29 ENCOUNTER — Telehealth: Payer: Self-pay | Admitting: *Deleted

## 2023-08-29 NOTE — Telephone Encounter (Signed)
 PC received from patient, she says she cannot remember if she took her Keppra this morning or not & is asking what to do.  Advised patient to wait & take her evening dose at regular time since we are not sure if she took it this morning.  Instructed patient to call this office if she has any seizures as a result.  She verbalizes understanding.

## 2023-08-30 ENCOUNTER — Telehealth: Payer: Self-pay | Admitting: *Deleted

## 2023-08-30 NOTE — Telephone Encounter (Signed)
 Received call from pt stating she has not heard from [facility] yet for an appt with Dr. Onalee Hua.  RN attempt x1 to contact office but office is closed on Friday's.  LVM for office to f/u with pt. RN also provided pt with office info and educated pt to reach out on Monday for an appt.  Pt verbalized understanding.

## 2023-09-06 ENCOUNTER — Other Ambulatory Visit: Payer: Self-pay

## 2023-09-20 ENCOUNTER — Telehealth: Payer: Self-pay

## 2023-09-20 NOTE — Telephone Encounter (Signed)
 Notified the pt about her Personal Care forms being completed, but I had no fax number on hand to get the documents faxed over.  I did let the pt know that I left a VM  with her case manager to return the phone call, so we can get the forms back to the. Pt verbalized understanding.

## 2023-10-08 NOTE — Therapy (Signed)
 OUTPATIENT PHYSICAL THERAPY NEURO EVALUATION   Patient Name: Sheryl Mcmillan MRN: [id number] DOB:10-Apr-1976, 48 y.o., female Today's Date: 10/09/2023   PCP: Ricky Stabs REFERRING PROVIDER: [name]  END OF SESSION:  PT End of Session - 10/09/23 1018     Visit Number 1    Date for PT Re-Evaluation 01/15/24    Authorization Type Medicaid    PT Start Time 1018    PT Stop Time 1100    PT Time Calculation (min) 42 min    Activity Tolerance Patient tolerated treatment well             Past Medical History:  Diagnosis Date   Arthritis    Breast cancer (HCC)    Family history of non-Hodgkin's lymphoma 06/01/2020   GERD (gastroesophageal reflux disease)    Hemorrhoids    Migraines    PCOS (polycystic ovarian syndrome)    PONV (postoperative nausea and vomiting)    Seizures (HCC)    Past Surgical History:  Procedure Laterality Date   APPLICATION OF CRANIAL NAVIGATION Left 11/10/2021   Procedure: APPLICATION OF CRANIAL NAVIGATION;  Surgeon: [name], MD;  Location: [facility] OR;  Service: Neurosurgery;  Laterality: Left;   AXILLARY SENTINEL NODE BIOPSY Left 11/07/2020   Procedure: LEFT AXILLARY SENTINEL NODE BIOPSY;  Surgeon: [name], MD;  Location: [facility] OR;  Service: General;  Laterality: Left;   BREAST CYST EXCISION Right 09/22/2021   Procedure: Excision of right axillary excess soft tissue.;  Surgeon: [name], MD;  Location: [facility];  Service: Plastics;  Laterality: Right;   BREAST RECONSTRUCTION WITH PLACEMENT OF TISSUE EXPANDER AND FLEX HD (ACELLULAR HYDRATED DERMIS) Bilateral 11/07/2020   Procedure: BILATERAL BREAST RECONSTRUCTION WITH PLACEMENT OF TISSUE EXPANDER AND FLEX HD (ACELLULAR HYDRATED DERMIS);  Surgeon: [name], MD;  Location: [facility] OR;  Service: Plastics;  Laterality: Bilateral;   COSMETIC SURGERY     CRANIOTOMY Left 11/10/2021   Procedure: LT FRONTAL CRANIOTOMY TUMOR EXCISION;  Surgeon: [name], MD;   Location: [facility] OR;  Service: Neurosurgery;  Laterality: Left;   DILATION AND CURETTAGE OF UTERUS     HEMORRHOID SURGERY     IR IMAGING GUIDED PORT INSERTION  06/15/2020   LIPOSUCTION WITH LIPOFILLING Bilateral 09/22/2021   Procedure: Fat grafting bilateral breast;  Surgeon: [name], MD;  Location: [facility];  Service: Plastics;  Laterality: Bilateral;   MASS EXCISION Bilateral 04/25/2021   Procedure: excision of bilateral axillary breast tissue;  Surgeon: [name], MD;  Location: [facility];  Service: Plastics;  Laterality: Bilateral;   PORT-A-CATH REMOVAL Right 11/07/2020   Procedure: REMOVAL PORT-A-CATH;  Surgeon: [name], MD;  Location: [facility] OR;  Service: General;  Laterality: Right;   REMOVAL OF BILATERAL TISSUE EXPANDERS WITH PLACEMENT OF BILATERAL BREAST IMPLANTS Bilateral 04/25/2021   Procedure: REMOVAL OF BILATERAL TISSUE EXPANDERS WITH PLACEMENT OF BILATERAL BREAST IMPLANTS;  Surgeon: [name], MD;  Location:  SURGERY CENTER;  Service: Plastics;  Laterality: Bilateral;   ROBOTIC ASSISTED TOTAL HYSTERECTOMY WITH BILATERAL SALPINGO OOPHERECTOMY Bilateral 03/14/2021   Procedure: XI ROBOTIC ASSISTED TOTAL HYSTERECTOMY WITH BILATERAL SALPINGO OOPHORECTOMY;  Surgeon: [name], MD;  Location: [facility];  Service: Gynecology;  Laterality: Bilateral;   TOTAL MASTECTOMY Bilateral 11/07/2020   Procedure: BILATERAL MASTECTOMY;  Surgeon: [name], MD;  Location: [facility] OR;  Service: General;  Laterality: Bilateral;   Patient Active Problem List   Diagnosis Date Noted   Leukocytosis 04/12/2023  Hyperlipidemia 04/11/2023   Seizure (HCC) 04/11/2023   Brain tumor (HCC) 11/08/2021   Metastatic cancer to brain (HCC) 11/08/2021   Cerebral edema (HCC) 11/08/2021   Complex partial seizure (HCC) 11/08/2021   Brain mass 11/07/2021   Right sided weakness 11/07/2021   IDA (iron deficiency anemia) 07/18/2021   Cholelithiasis 03/14/2021    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   Obesity, Class III, BMI 40-49.9 (morbid obesity) (HCC) 07/19/2020   BRCA1 gene mutation positive 07/04/2020   Genetic testing 06/28/2020   Family history of non-Hodgkin's lymphoma 06/01/2020   Malignant neoplasm of upper-outer quadrant of left breast in female, estrogen receptor positive (HCC) 05/26/2020   Acute right ankle pain 05/16/2016   Nondisplaced fracture of lateral malleolus of right fibula, initial encounter for closed fracture 05/16/2016    ONSET DATE: 09/24/23  REFERRING DIAG:  C79.31 (ICD-10-CM) - Secondary malignant neoplasm of brain    THERAPY DIAG:  Aftercare following surgery for neoplasm  Unsteadiness on feet  Muscle weakness (generalized)  Other lack of coordination  Other abnormalities of gait and mobility  Difficulty in walking, not elsewhere classified  Rationale for Evaluation and Treatment: Rehabilitation  SUBJECTIVE:                                                                                                                                                                                             SUBJECTIVE STATEMENT: I started having seizures, 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 Last one was in October. I couldn't drive for 6 months. Have to go back next month for scans to check for brain activity and cancer. I feel good and better. The medicine is regulated now.  Pt accompanied by: self  PERTINENT HISTORY: hx of breast cancer and total masectomy, seizures, frontal craniotomy for tumor excision 11/10/21  PAIN:  Are you having pain? No  PRECAUTIONS: Fall  RED FLAGS: None   WEIGHT BEARING RESTRICTIONS: No  FALLS: Has patient fallen in last 6 months? No  LIVING ENVIRONMENT: Lives with: lives with their family Lives in: House/apartment Stairs: Yes: External: 3 steps; none Has following equipment at home: Single point cane  PLOF: Independent  PATIENT GOALS: want to be more  aligned, I can feel my gait is off. Want to be more balanced and coordinated   OBJECTIVE:  Note: Objective measures were completed at Evaluation unless otherwise noted.  DIAGNOSTIC FINDINGS: 07/08/23 Brain: Encephalomalacia and a small amount of chronic blood products are again noted at the left frontoparietal resection site. A small amount of nonsuspicious enhancement at the resection site is unchanged. No new enhancing intracranial lesion is identified. The  ventricles are normal in size. No acute infarct, midline shift, or extra-axial fluid collection is evident.  Unchanged left frontoparietal resection site. No evidence of new or progressive intracranial metastatic disease.    COGNITION: Overall cognitive status: Within functional limits for tasks assessed   SENSATION: WFL  COORDINATION: Some decreased motor control on RLE    MUSCLE LENGTH:  Some tightness in R hip, BL hamstrings, and in R ankle   POSTURE: rounded shoulders  LOWER EXTREMITY ROM:   all WFL, except R ankle DF still limited to 10d    LOWER EXTREMITY MMT:  grossly 5/5    FUNCTIONAL TESTS:   9 reps  30 seconds chair stand test Berg Balance Scale: 50/56  PATIENT SURVEYS:  ABC scale 65%                                                                                                                              TREATMENT DATE: 10/09/23- EVAL    PATIENT EDUCATION: Education details: POC Person educated: Patient Education method: Explanation Education comprehension: verbalized understanding  HOME EXERCISE PROGRAM: Walking, SLS, doing some exercises with bands  GOALS: Goals reviewed with patient? Yes  SHORT TERM GOALS: Target date: 11/27/23  Patient will be independent with initial HEP. Baseline:  Goal status: INITIAL  2.  Patient will improve R ankle DF to 20d Baseline: 10d Goal status: INITIAL    LONG TERM GOALS: Target date: 01/15/24  Patient will be independent with advanced/ongoing HEP to  improve outcomes and carryover.  Baseline:  Goal status: INITIAL  2.  Patient will be able stand SLS on RLE >10s on firm and foam surfaces Baseline: 5s at best on firm  Goal status: INITIAL  3.  Patient will be able to do at least 10 alternating taps on 6" in 20s  Baseline: 6 on 4" in 20s  Goal status: INITIAL   4.  Patient will demonstrate 12 reps or more with 30s chair test Baseline: 9 Goal status: INITIAL  5.  Patient will improve ABC confidence score to 75-80%  Baseline: 65%  Goal status: INITIAL   ASSESSMENT:  CLINICAL IMPRESSION: Patient is a 48 y.o. female who was seen today for physical therapy evaluation and treatment for aftercare following brain surgery. Her surgery was 2 years ago but she began having complication recently and seizures. She still has decreased coordination with RLE, and unable to do equal movements with both sides. R ankle DF has returned significantly but still missing about 10d. Pt would like to increase her mobility and ability to do things with her RLE to feel more balance and coordinated. She will benefit from PT to work on her overall strength, balance, and coordination to improve her confidence out in the community.   OBJECTIVE IMPAIRMENTS: Abnormal gait, decreased balance, decreased coordination, and decreased ROM.   ACTIVITY LIMITATIONS: locomotion level  PARTICIPATION LIMITATIONS: community activity and yard work  PERSONAL FACTORS: Fitness, Past/current experiences, and Time since  onset of injury/illness/exacerbation are also affecting patient's functional outcome.   REHAB POTENTIAL: Good  CLINICAL DECISION MAKING: Stable/uncomplicated  EVALUATION COMPLEXITY: Low  PLAN:  PT FREQUENCY: 1x/week  PT DURATION: 12 weeks  PLANNED INTERVENTIONS: 97110-Therapeutic exercises, 97530- Therapeutic activity, [id number]- Neuromuscular re-education, 97535- Self Care, [id number]- Manual therapy, [id number]- Gait training, [phone]- Electrical stimulation (manual),  [phone]- Ultrasound, Patient/Family education, Balance training, Stair training, Taping, Dry Needling, Joint mobilization, Spinal mobilization, Cryotherapy, and Moist heat  PLAN FOR NEXT SESSION: R ankle DF ROM, balance training, steps, foam surfaces, establish HEP    [employer], PT 10/09/2023, 11:06 AM

## 2023-10-09 ENCOUNTER — Ambulatory Visit: Attending: Neurosurgery

## 2023-10-09 DIAGNOSIS — R262 Difficulty in walking, not elsewhere classified: Secondary | ICD-10-CM | POA: Insufficient documentation

## 2023-10-09 DIAGNOSIS — R278 Other lack of coordination: Secondary | ICD-10-CM | POA: Diagnosis present

## 2023-10-09 DIAGNOSIS — R2689 Other abnormalities of gait and mobility: Secondary | ICD-10-CM | POA: Insufficient documentation

## 2023-10-09 DIAGNOSIS — R2681 Unsteadiness on feet: Secondary | ICD-10-CM | POA: Insufficient documentation

## 2023-10-09 DIAGNOSIS — Z483 Aftercare following surgery for neoplasm: Secondary | ICD-10-CM | POA: Insufficient documentation

## 2023-10-09 DIAGNOSIS — M6281 Muscle weakness (generalized): Secondary | ICD-10-CM | POA: Diagnosis present

## 2023-10-17 ENCOUNTER — Ambulatory Visit: Admitting: Dermatology

## 2023-10-17 ENCOUNTER — Encounter: Payer: Self-pay | Admitting: Dermatology

## 2023-10-17 VITALS — BP 128/74 | HR 89

## 2023-10-17 DIAGNOSIS — Z1283 Encounter for screening for malignant neoplasm of skin: Secondary | ICD-10-CM

## 2023-10-17 DIAGNOSIS — L578 Other skin changes due to chronic exposure to nonionizing radiation: Secondary | ICD-10-CM | POA: Diagnosis not present

## 2023-10-17 DIAGNOSIS — L821 Other seborrheic keratosis: Secondary | ICD-10-CM

## 2023-10-17 DIAGNOSIS — D485 Neoplasm of uncertain behavior of skin: Secondary | ICD-10-CM

## 2023-10-17 DIAGNOSIS — W908XXA Exposure to other nonionizing radiation, initial encounter: Secondary | ICD-10-CM | POA: Diagnosis not present

## 2023-10-17 DIAGNOSIS — D492 Neoplasm of unspecified behavior of bone, soft tissue, and skin: Secondary | ICD-10-CM | POA: Diagnosis not present

## 2023-10-17 DIAGNOSIS — L814 Other melanin hyperpigmentation: Secondary | ICD-10-CM

## 2023-10-17 DIAGNOSIS — D1801 Hemangioma of skin and subcutaneous tissue: Secondary | ICD-10-CM

## 2023-10-17 DIAGNOSIS — D045 Carcinoma in situ of skin of trunk: Secondary | ICD-10-CM | POA: Diagnosis not present

## 2023-10-17 DIAGNOSIS — D229 Melanocytic nevi, unspecified: Secondary | ICD-10-CM

## 2023-10-17 NOTE — Patient Instructions (Signed)
 Important Information  Due to recent changes in healthcare laws, you may see results of your pathology and/or laboratory studies on MyChart before the doctors have had a chance to review them. We understand that in some cases there may be results that are confusing or concerning to you. Please understand that not all results are received at the same time and often the doctors may need to interpret multiple results in order to provide you with the best plan of care or course of treatment. Therefore, we ask that you please give Sheryl Mcmillan 2 business days to thoroughly review all your results before contacting the office for clarification. Should we see a critical lab result, you will be contacted sooner.   If You Need Anything After Your Visit  If you have any questions or concerns for your doctor, please call our main line at [phone] If no one answers, please leave a voicemail as directed and we will return your call as soon as possible. Messages left after 4 pm will be answered the following business day.   You may also send Sheryl Mcmillan a message via MyChart. We typically respond to MyChart messages within 1-2 business days.  For prescription refills, please ask your pharmacy to contact our office. Our fax number is [phone].  If you have an urgent issue when the clinic is closed that cannot wait until the next business day, you can page your doctor at the number below.    Please note that while we do our best to be available for urgent issues outside of office hours, we are not available 24/7.   If you have an urgent issue and are unable to reach Sheryl Mcmillan, you may choose to seek medical care at your doctor's office, retail clinic, urgent care center, or emergency room.  If you have a medical emergency, please immediately call 911 or go to the emergency department. In the event of inclement weather, please call our main line at [phone] for an update on the status of any delays or  closures.  Dermatology Medication Tips: Please keep the boxes that topical medications come in in order to help keep track of the instructions about where and how to use these. Pharmacies typically print the medication instructions only on the boxes and not directly on the medication tubes.   If your medication is too expensive, please contact our office at [phone] or send Sheryl Mcmillan a message through MyChart.   We are unable to tell what your co-pay for medications will be in advance as this is different depending on your insurance coverage. However, we may be able to find a substitute medication at lower cost or fill out paperwork to get insurance to cover a needed medication.   If a prior authorization is required to get your medication covered by your insurance company, please allow Sheryl Mcmillan 1-2 business days to complete this process.  Drug prices often vary depending on where the prescription is filled and some pharmacies may offer cheaper prices.  The website www.goodrx.com contains coupons for medications through different pharmacies. The prices here do not account for what the cost may be with help from insurance (it may be cheaper with your insurance), but the website can give you the price if you did not use any insurance.  - You can print the associated coupon and take it with your prescription to the pharmacy.  - You may also stop by our office during regular business hours and pick up a GoodRx coupon card.  - If  you need your prescription sent electronically to a different pharmacy, notify our office through [facility] or by phone at [phone]    Skin Education :   I counseled the patient regarding the following: Sun screen (SPF 30 or greater) should be applied during peak UV exposure (between 10am and 2pm) and reapplied after exercise or swimming.  The ABCDEs of melanoma were reviewed with the patient, and the importance of monthly self-examination of moles was emphasized.  Should any moles change in shape or color, or itch, bleed or burn, pt will contact our office for evaluation sooner then their interval appointment.  Plan: Sunscreen Recommendations I recommended a broad spectrum sunscreen with a SPF of 30 or higher. I explained that SPF 30 sunscreens block approximately 97 percent of the sun's harmful rays. Sunscreens should be applied at least 15 minutes prior to expected sun exposure and then every 2 hours after that as long as sun exposure continues. If swimming or exercising sunscreen should be reapplied every 45 minutes to an hour after getting wet or sweating. One ounce, or the equivalent of a shot glass full of sunscreen, is adequate to protect the skin not covered by a bathing suit. I also recommended a lip balm with a sunscreen as well. Sun protective clothing can be used in lieu of sunscreen but must be worn the entire time you are exposed to the sun's rays.  Patient Handout: Wound Care for Skin Biopsy Site  Taking Care of Your Skin Biopsy Site  Proper care of the biopsy site is essential for promoting healing and minimizing scarring. This handout provides instructions on how to care for your biopsy site to ensure optimal recovery.  1. Cleaning the Wound:  Clean the biopsy site daily with gentle soap and water. Gently pat the area dry with a clean, soft towel. Avoid harsh scrubbing or rubbing the area, as this can irritate the skin and delay healing.  2. Applying Aquaphor and Bandage:  After cleaning the wound, apply a thin layer of Aquaphor ointment to the biopsy site. Cover the area with a sterile bandage to protect it from dirt, bacteria, and friction. Change the bandage daily or as needed if it becomes soiled or wet.  3. Continued Care for One Week:  Repeat the cleaning, Aquaphor application, and bandaging process daily for one week following the biopsy procedure. Keeping the wound clean and moist during this initial healing period will help  prevent infection and promote optimal healing.  4. Massaging Aquaphor into the Area:  ---After one week, discontinue the use of bandages but continue to apply Aquaphor to the biopsy site. ----Gently massage the Aquaphor into the area using circular motions. ---Massaging the skin helps to promote circulation and prevent the formation of scar tissue.   Additional Tips:  Avoid exposing the biopsy site to direct sunlight during the healing process, as this can cause hyperpigmentation or worsen scarring. If you experience any signs of infection, such as increased redness, swelling, warmth, or drainage from the wound, contact your healthcare provider immediately. Follow any additional instructions provided by your healthcare provider for caring for the biopsy site and managing any discomfort. Conclusion:  Taking proper care of your skin biopsy site is crucial for ensuring optimal healing and minimizing scarring. By following these instructions for cleaning, applying Aquaphor, and massaging the area, you can promote a smooth and successful recovery. If you have any questions or concerns about caring for your biopsy site, don't hesitate to contact your healthcare  provider for guidance.

## 2023-10-17 NOTE — Progress Notes (Signed)
 New Patient Visit   Subjective  Sheryl Mcmillan is a 48 y.o. female who presents for the following: Skin Cancer Screening and Full Body Skin Exam. No hx or family hx of skin cancer. Patient is accompanied by her daughter. 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  The patient presents for Total-Body Skin Exam (TBSE) for skin cancer screening and mole check. The patient has spots, moles and lesions to be evaluated, some may be new or changing.  The following portions of the chart were reviewed this encounter and updated as appropriate: medications, allergies, medical history  Review of Systems:  No other skin or systemic complaints except as noted in HPI or Assessment and Plan.  Objective  Well appearing patient in no apparent distress; mood and affect are within normal limits.  A full examination was performed including scalp, head, eyes, ears, nose, lips, neck, chest, axillae, abdomen, back, buttocks, bilateral upper extremities, bilateral lower extremities, hands, feet, fingers, toes, fingernails, and toenails. All findings within normal limits unless otherwise noted below.   Relevant physical exam findings are noted in the Assessment and Plan.  Right Breast 0.8 cm pearly pink annular papule  Left Breast 1.0 cm pink scaly papule   Assessment & Plan   SKIN CANCER SCREENING PERFORMED TODAY.  ACTINIC DAMAGE - Chronic condition, secondary to cumulative UV/sun exposure - diffuse scaly erythematous macules with underlying dyspigmentation - Recommend daily broad spectrum sunscreen SPF 30+ to sun-exposed areas, reapply every 2 hours as needed.  - Staying in the shade or wearing long sleeves, sun glasses (UVA+UVB protection) and wide brim hats (4-inch brim around the entire circumference of the hat) are also recommended for sun protection.  - Call for new or changing lesions.  LENTIGINES, SEBORRHEIC KERATOSES, HEMANGIOMAS - Benign normal skin lesions -  Benign-appearing - Call for any changes  MELANOCYTIC NEVI - Tan-brown and/or pink-flesh-colored symmetric macules and papules - Benign appearing on exam today - Observation - Call clinic for new or changing moles - Recommend daily use of broad spectrum spf 30+ sunscreen to sun-exposed areas.   NEOPLASM OF UNCERTAIN BEHAVIOR OF SKIN (2) Right Breast Skin / nail biopsy Type of biopsy: tangential   Informed consent: discussed and consent obtained   Timeout: patient name, date of birth, surgical site, and procedure verified   Procedure prep:  Patient was prepped and draped in usual sterile fashion Prep type:  Isopropyl alcohol Anesthesia: the lesion was anesthetized in a standard fashion   Anesthetic:  1% lidocaine  w/ epinephrine  1-100,000 buffered w/ 8.4% NaHCO3 Instrument used: DermaBlade   Hemostasis achieved with: aluminum chloride   Outcome: patient tolerated procedure well   Post-procedure details: sterile dressing applied and wound care instructions given   Dressing type: petrolatum and bandage   Specimen 1 - Surgical pathology Differential Diagnosis: r/o NMSC vs other  Check Margins: No Left Breast Skin / nail biopsy Type of biopsy: tangential   Informed consent: discussed and consent obtained   Timeout: patient name, date of birth, surgical site, and procedure verified   Procedure prep:  Patient was prepped and draped in usual sterile fashion Prep type:  Isopropyl alcohol Anesthesia: the lesion was anesthetized in a standard fashion   Anesthetic:  1% lidocaine  w/ epinephrine  1-100,000 buffered w/ 8.4% NaHCO3 Instrument used: DermaBlade   Hemostasis achieved with: aluminum chloride   Outcome: patient tolerated procedure well   Post-procedure details: sterile dressing applied and wound care instructions given   Specimen 2 - Surgical pathology Differential Diagnosis:  r/o NMSC vs other  Check Margins: No  Return in about 1 year (around 10/16/2024) for TBSC .  I,  Haig Levan, Surg Tech III, am acting as scribe for Deneise Finlay, MD.   Documentation: I have reviewed the above documentation for accuracy and completeness, and I agree with the above.  Deneise Finlay, MD

## 2023-10-21 NOTE — Therapy (Signed)
 OUTPATIENT PHYSICAL THERAPY NEURO TREATMENT   Patient Name: Sheryl Mcmillan MRN: [id number] DOB:Jan 28, 1976, 48 y.o., female Today's Date: 10/22/2023   PCP: Lavona Pounds REFERRING PROVIDER: Augusto Blonder  END OF SESSION:  PT End of Session - 10/22/23 1230     Visit Number 2    Date for PT Re-Evaluation 01/15/24    Authorization Type Medicaid    PT Start Time 1230    PT Stop Time 1315    PT Time Calculation (min) 45 min    Activity Tolerance Patient tolerated treatment well              Past Medical History:  Diagnosis Date   Arthritis    Breast cancer (HCC)    Family history of non-Hodgkin's lymphoma 06/01/2020   GERD (gastroesophageal reflux disease)    Hemorrhoids    Migraines    PCOS (polycystic ovarian syndrome)    PONV (postoperative nausea and vomiting)    Seizures (HCC)    Past Surgical History:  Procedure Laterality Date   APPLICATION OF CRANIAL NAVIGATION Left 11/10/2021   Procedure: APPLICATION OF CRANIAL NAVIGATION;  Surgeon: Augusto Blonder, MD;  Location: [facility];  Service: Neurosurgery;  Laterality: Left;   AXILLARY SENTINEL NODE BIOPSY Left 11/07/2020   Procedure: LEFT AXILLARY SENTINEL NODE BIOPSY;  Surgeon: Enid Harry, MD;  Location: [facility] OR;  Service: General;  Laterality: Left;   BREAST CYST EXCISION Right 09/22/2021   Procedure: Excision of right axillary excess soft tissue.;  Surgeon: Barb Bonito, MD;  Location: [facility];  Service: Plastics;  Laterality: Right;   BREAST RECONSTRUCTION WITH PLACEMENT OF TISSUE EXPANDER AND FLEX HD (ACELLULAR HYDRATED DERMIS) Bilateral 11/07/2020   Procedure: BILATERAL BREAST RECONSTRUCTION WITH PLACEMENT OF TISSUE EXPANDER AND FLEX HD (ACELLULAR HYDRATED DERMIS);  Surgeon: Barb Bonito, MD;  Location: [facility] OR;  Service: Plastics;  Laterality: Bilateral;   COSMETIC SURGERY     CRANIOTOMY Left 11/10/2021   Procedure: LT FRONTAL CRANIOTOMY TUMOR EXCISION;  Surgeon: Augusto Blonder, MD;   Location: [facility];  Service: Neurosurgery;  Laterality: Left;   DILATION AND CURETTAGE OF UTERUS     HEMORRHOID SURGERY     IR IMAGING GUIDED PORT INSERTION  06/15/2020   LIPOSUCTION WITH LIPOFILLING Bilateral 09/22/2021   Procedure: Fat grafting bilateral breast;  Surgeon: Barb Bonito, MD;  Location: [facility];  Service: Plastics;  Laterality: Bilateral;   MASS EXCISION Bilateral 04/25/2021   Procedure: excision of bilateral axillary breast tissue;  Surgeon: Barb Bonito, MD;  Location: [facility];  Service: Plastics;  Laterality: Bilateral;   PORT-A-CATH REMOVAL Right 11/07/2020   Procedure: REMOVAL PORT-A-CATH;  Surgeon: Enid Harry, MD;  Location: [facility] OR;  Service: General;  Laterality: Right;   REMOVAL OF BILATERAL TISSUE EXPANDERS WITH PLACEMENT OF BILATERAL BREAST IMPLANTS Bilateral 04/25/2021   Procedure: REMOVAL OF BILATERAL TISSUE EXPANDERS WITH PLACEMENT OF BILATERAL BREAST IMPLANTS;  Surgeon: Barb Bonito, MD;  Location: [facility];  Service: Plastics;  Laterality: Bilateral;   ROBOTIC ASSISTED TOTAL HYSTERECTOMY WITH BILATERAL SALPINGO OOPHERECTOMY Bilateral 03/14/2021   Procedure: XI ROBOTIC ASSISTED TOTAL HYSTERECTOMY WITH BILATERAL SALPINGO OOPHORECTOMY;  Surgeon: Alphonso Aschoff, MD;  Location: [facility];  Service: Gynecology;  Laterality: Bilateral;   TOTAL MASTECTOMY Bilateral 11/07/2020   Procedure: BILATERAL MASTECTOMY;  Surgeon: Enid Harry, MD;  Location: [facility] OR;  Service: General;  Laterality: Bilateral;   Patient Active Problem List   Diagnosis Date Noted   Leukocytosis 04/12/2023  Hyperlipidemia 04/11/2023   Seizure (HCC) 04/11/2023   Brain tumor (HCC) 11/08/2021   Metastatic cancer to brain (HCC) 11/08/2021   Cerebral edema (HCC) 11/08/2021   Complex partial seizure (HCC) 11/08/2021   Brain mass 11/07/2021   Right sided weakness 11/07/2021   IDA (iron deficiency anemia) 07/18/2021   Cholelithiasis  03/14/2021   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   Obesity, Class III, BMI 40-49.9 (morbid obesity) (HCC) 07/19/2020   BRCA1 gene mutation positive 07/04/2020   Genetic testing 06/28/2020   Family history of non-Hodgkin's lymphoma 06/01/2020   Malignant neoplasm of upper-outer quadrant of left breast in female, estrogen receptor positive (HCC) 05/26/2020   Acute right ankle pain 05/16/2016   Nondisplaced fracture of lateral malleolus of right fibula, initial encounter for closed fracture 05/16/2016    ONSET DATE: 09/24/23  REFERRING DIAG:  C79.31 (ICD-10-CM) - Secondary malignant neoplasm of brain    THERAPY DIAG:  Aftercare following surgery for neoplasm  Unsteadiness on feet  Muscle weakness (generalized)  Other lack of coordination  Other abnormalities of gait and mobility  Rationale for Evaluation and Treatment: Rehabilitation  SUBJECTIVE:                                                                                                                                                                                             SUBJECTIVE STATEMENT: I am doing good.    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 Last one was in October. I couldn't drive for 6 months. Have to go back next month for scans to check for brain activity and cancer. I feel good and better. The medicine is regulated now.  Pt accompanied by: self  PERTINENT HISTORY: hx of breast cancer and total masectomy, seizures, frontal craniotomy for tumor excision 11/10/21  PAIN:  Are you having pain? No  PRECAUTIONS: Fall  RED FLAGS: None   WEIGHT BEARING RESTRICTIONS: No  FALLS: Has patient fallen in last 6 months? No  LIVING ENVIRONMENT: Lives with: lives with their family Lives in: House/apartment Stairs: Yes: External: 3 steps; none Has following equipment at home: Single point cane  PLOF: Independent  PATIENT GOALS: want to be more aligned, I can  feel my gait is off. Want to be more balanced and coordinated   OBJECTIVE:  Note: Objective measures were completed at Evaluation unless otherwise noted.  DIAGNOSTIC FINDINGS: 07/08/23 Brain: Encephalomalacia and a small amount of chronic blood products are again noted at the left frontoparietal resection site. A small amount of nonsuspicious enhancement at the resection site is unchanged. No new enhancing intracranial lesion is identified.  The ventricles are normal in size. No acute infarct, midline shift, or extra-axial fluid collection is evident.  Unchanged left frontoparietal resection site. No evidence of new or progressive intracranial metastatic disease.    COGNITION: Overall cognitive status: Within functional limits for tasks assessed   SENSATION: WFL  COORDINATION: Some decreased motor control on RLE    MUSCLE LENGTH:  Some tightness in R hip, BL hamstrings, and in R ankle   POSTURE: rounded shoulders  LOWER EXTREMITY ROM:   all WFL, except R ankle DF still limited to 10d    LOWER EXTREMITY MMT:  grossly 5/5    FUNCTIONAL TESTS:   9 reps  30 seconds chair stand test Berg Balance Scale: 50/56  PATIENT SURVEYS:  ABC scale 65%                                                                                                                              TREATMENT DATE:  10/22/23 Walk outdoors 1 big lap  Rockerboard Step ups 6" Side steps on to airex ankleTB green 20 reps each way R  Resisted gait 30# 3way x4  establish HEP   10/09/23- EVAL    PATIENT EDUCATION: Education details: POC Person educated: Patient Education method: Explanation Education comprehension: verbalized understanding  HOME EXERCISE PROGRAM: Walking, SLS, doing some exercises with bands  Access Code: [email or web address] URL: [email or web address] Date: 10/22/2023 Prepared by: Donavon Fudge  Exercises - Single Leg Stance  - 1 x daily - 7 x weekly - 2 sets - 10 hold - Tandem  Stance  - 1 x daily - 7 x weekly - 2 sets - 10 reps - Sit to Stand  - 1 x daily - 7 x weekly - 2 sets - 10 reps - Heel Raises with Counter Support  - 1 x daily - 7 x weekly - 2 sets - 10 reps - Heel Toe Raises with Counter Support  - 1 x daily - 7 x weekly - 2 sets - 10 reps  GOALS: Goals reviewed with patient? Yes  SHORT TERM GOALS: Target date: 11/27/23  Patient will be independent with initial HEP. Baseline:  Goal status: INITIAL  2.  Patient will improve R ankle DF to 20d Baseline: 10d Goal status: INITIAL    LONG TERM GOALS: Target date: 01/15/24  Patient will be independent with advanced/ongoing HEP to improve outcomes and carryover.  Baseline:  Goal status: INITIAL  2.  Patient will be able stand SLS on RLE >10s on firm and foam surfaces Baseline: 5s at best on firm  Goal status: INITIAL  3.  Patient will be able to do at least 10 alternating taps on 6" in 20s  Baseline: 6 on 4" in 20s  Goal status: INITIAL   4.  Patient will demonstrate 12 reps or more with 30s chair test Baseline: 9 Goal status: INITIAL  5.  Patient will improve ABC confidence score to 75-80%  Baseline: 65%  Goal  status: INITIAL   ASSESSMENT:  CLINICAL IMPRESSION: Patient returns doing really well. In comparison to previous visits, she has made tremendous progress. She was able to do step ups without support, which use to be fearful for her. Still a bit scared with descending stairs without holding on to rail. Pt was also able to walk outdoors up and down hill and navigate curbs without hesitation. Can progress to try some more difficult balance and coordination tasks.   EVAL Patient is a 48 y.o. female who was seen today for physical therapy evaluation and treatment for aftercare following brain surgery. Her surgery was 2 years ago but she began having complication recently and seizures. She still has decreased coordination with RLE, and unable to do equal movements with both sides. R ankle DF  has returned significantly but still missing about 10d. Pt would like to increase her mobility and ability to do things with her RLE to feel more balance and coordinated. She will benefit from PT to work on her overall strength, balance, and coordination to improve her confidence out in the community.   OBJECTIVE IMPAIRMENTS: Abnormal gait, decreased balance, decreased coordination, and decreased ROM.   ACTIVITY LIMITATIONS: locomotion level  PARTICIPATION LIMITATIONS: community activity and yard work  PERSONAL FACTORS: Fitness, Past/current experiences, and Time since onset of injury/illness/exacerbation are also affecting patient's functional outcome.   REHAB POTENTIAL: Good  CLINICAL DECISION MAKING: Stable/uncomplicated  EVALUATION COMPLEXITY: Low  PLAN:  PT FREQUENCY: 1x/week  PT DURATION: 12 weeks  PLANNED INTERVENTIONS: 97110-Therapeutic exercises, 97530- Therapeutic activity, [id number]- Neuromuscular re-education, 97535- Self Care, [id number]- Manual therapy, [id number]- Gait training, [phone]- Electrical stimulation (manual), [phone]- Ultrasound, Patient/Family education, Balance training, Stair training, Taping, Dry Needling, Joint mobilization, Spinal mobilization, Cryotherapy, and Moist heat  PLAN FOR NEXT SESSION: R ankle DF ROM, balance training, steps, foam surfaces, establish HEP    [employer], PT 10/22/2023, 1:15 PM

## 2023-10-22 ENCOUNTER — Ambulatory Visit

## 2023-10-22 DIAGNOSIS — R278 Other lack of coordination: Secondary | ICD-10-CM

## 2023-10-22 DIAGNOSIS — M6281 Muscle weakness (generalized): Secondary | ICD-10-CM

## 2023-10-22 DIAGNOSIS — Z483 Aftercare following surgery for neoplasm: Secondary | ICD-10-CM

## 2023-10-22 DIAGNOSIS — R2681 Unsteadiness on feet: Secondary | ICD-10-CM

## 2023-10-22 DIAGNOSIS — R2689 Other abnormalities of gait and mobility: Secondary | ICD-10-CM

## 2023-10-24 LAB — SURGICAL PATHOLOGY

## 2023-10-28 ENCOUNTER — Telehealth: Payer: Self-pay | Admitting: Dermatology

## 2023-10-28 NOTE — Therapy (Signed)
 OUTPATIENT PHYSICAL THERAPY NEURO TREATMENT   Patient Name: Sheryl Mcmillan MRN: [id number] DOB:Apr 03, 1976, 48 y.o., female Today's Date: 10/29/2023   PCP: Lavona Pounds REFERRING PROVIDER: Augusto Blonder  END OF SESSION:  PT End of Session - 10/29/23 1456     Visit Number 3    Date for PT Re-Evaluation 01/15/24    Authorization Type Medicaid    PT Start Time 1500    PT Stop Time 1545    PT Time Calculation (min) 45 min    Activity Tolerance Patient tolerated treatment well               Past Medical History:  Diagnosis Date   Arthritis    Breast cancer (HCC)    Family history of non-Hodgkin's lymphoma 06/01/2020   GERD (gastroesophageal reflux disease)    Hemorrhoids    Migraines    PCOS (polycystic ovarian syndrome)    PONV (postoperative nausea and vomiting)    Seizures (HCC)    Past Surgical History:  Procedure Laterality Date   APPLICATION OF CRANIAL NAVIGATION Left 11/10/2021   Procedure: APPLICATION OF CRANIAL NAVIGATION;  Surgeon: Augusto Blonder, MD;  Location: [facility];  Service: Neurosurgery;  Laterality: Left;   AXILLARY SENTINEL NODE BIOPSY Left 11/07/2020   Procedure: LEFT AXILLARY SENTINEL NODE BIOPSY;  Surgeon: Enid Harry, MD;  Location: [facility] OR;  Service: General;  Laterality: Left;   BREAST CYST EXCISION Right 09/22/2021   Procedure: Excision of right axillary excess soft tissue.;  Surgeon: Barb Bonito, MD;  Location: [facility];  Service: Plastics;  Laterality: Right;   BREAST RECONSTRUCTION WITH PLACEMENT OF TISSUE EXPANDER AND FLEX HD (ACELLULAR HYDRATED DERMIS) Bilateral 11/07/2020   Procedure: BILATERAL BREAST RECONSTRUCTION WITH PLACEMENT OF TISSUE EXPANDER AND FLEX HD (ACELLULAR HYDRATED DERMIS);  Surgeon: Barb Bonito, MD;  Location: [facility] OR;  Service: Plastics;  Laterality: Bilateral;   COSMETIC SURGERY     CRANIOTOMY Left 11/10/2021   Procedure: LT FRONTAL CRANIOTOMY TUMOR EXCISION;  Surgeon: Augusto Blonder,  MD;  Location: [facility];  Service: Neurosurgery;  Laterality: Left;   DILATION AND CURETTAGE OF UTERUS     HEMORRHOID SURGERY     IR IMAGING GUIDED PORT INSERTION  06/15/2020   LIPOSUCTION WITH LIPOFILLING Bilateral 09/22/2021   Procedure: Fat grafting bilateral breast;  Surgeon: Barb Bonito, MD;  Location: [facility];  Service: Plastics;  Laterality: Bilateral;   MASS EXCISION Bilateral 04/25/2021   Procedure: excision of bilateral axillary breast tissue;  Surgeon: Barb Bonito, MD;  Location: [facility];  Service: Plastics;  Laterality: Bilateral;   PORT-A-CATH REMOVAL Right 11/07/2020   Procedure: REMOVAL PORT-A-CATH;  Surgeon: Enid Harry, MD;  Location: [facility] OR;  Service: General;  Laterality: Right;   REMOVAL OF BILATERAL TISSUE EXPANDERS WITH PLACEMENT OF BILATERAL BREAST IMPLANTS Bilateral 04/25/2021   Procedure: REMOVAL OF BILATERAL TISSUE EXPANDERS WITH PLACEMENT OF BILATERAL BREAST IMPLANTS;  Surgeon: Barb Bonito, MD;  Location: [facility];  Service: Plastics;  Laterality: Bilateral;   ROBOTIC ASSISTED TOTAL HYSTERECTOMY WITH BILATERAL SALPINGO OOPHERECTOMY Bilateral 03/14/2021   Procedure: XI ROBOTIC ASSISTED TOTAL HYSTERECTOMY WITH BILATERAL SALPINGO OOPHORECTOMY;  Surgeon: Alphonso Aschoff, MD;  Location: [facility];  Service: Gynecology;  Laterality: Bilateral;   TOTAL MASTECTOMY Bilateral 11/07/2020   Procedure: BILATERAL MASTECTOMY;  Surgeon: Enid Harry, MD;  Location: [facility] OR;  Service: General;  Laterality: Bilateral;   Patient Active Problem List   Diagnosis Date Noted   Leukocytosis 04/12/2023  Hyperlipidemia 04/11/2023   Seizure (HCC) 04/11/2023   Brain tumor (HCC) 11/08/2021   Metastatic cancer to brain (HCC) 11/08/2021   Cerebral edema (HCC) 11/08/2021   Complex partial seizure (HCC) 11/08/2021   Brain mass 11/07/2021   Right sided weakness 11/07/2021   IDA (iron deficiency anemia) 07/18/2021   Cholelithiasis  03/14/2021   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   Obesity, Class III, BMI 40-49.9 (morbid obesity) 07/19/2020   BRCA1 gene mutation positive 07/04/2020   Genetic testing 06/28/2020   Family history of non-Hodgkin's lymphoma 06/01/2020   Malignant neoplasm of upper-outer quadrant of left breast in female, estrogen receptor positive (HCC) 05/26/2020   Acute right ankle pain 05/16/2016   Nondisplaced fracture of lateral malleolus of right fibula, initial encounter for closed fracture 05/16/2016    ONSET DATE: 09/24/23  REFERRING DIAG:  C79.31 (ICD-10-CM) - Secondary malignant neoplasm of brain    THERAPY DIAG:  Aftercare following surgery for neoplasm  Unsteadiness on feet  Muscle weakness (generalized)  Other lack of coordination  Other abnormalities of gait and mobility  Rationale for Evaluation and Treatment: Rehabilitation  SUBJECTIVE:                                                                                                                                                                                             SUBJECTIVE STATEMENT: 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 I elected to do the topical chemo, starting Monday.    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 Last one was in October. I couldn't drive for 6 months. Have to go back next month for scans to check for brain activity and cancer. I feel good and better. The medicine is regulated now.  Pt accompanied by: self  PERTINENT HISTORY: hx of breast cancer and total masectomy, seizures, frontal craniotomy for tumor excision 11/10/21  PAIN:  Are you having pain? No  PRECAUTIONS: Fall  RED FLAGS: None   WEIGHT BEARING RESTRICTIONS: No  FALLS: Has patient fallen in last 6 months? No  LIVING ENVIRONMENT: Lives with: lives with their family Lives in: House/apartment Stairs: Yes: External: 3 steps; none Has following equipment at home:  Single point cane  PLOF: Independent  PATIENT GOALS: want to be more aligned, I can feel my gait is off. Want to be more balanced and coordinated   OBJECTIVE:  Note: Objective measures were completed at Evaluation unless otherwise noted.  DIAGNOSTIC FINDINGS: 07/08/23 Brain: Encephalomalacia and a small amount of chronic blood products are again noted at the left frontoparietal resection site. A small amount of  nonsuspicious enhancement at the resection site is unchanged. No new enhancing intracranial lesion is identified. The ventricles are normal in size. No acute infarct, midline shift, or extra-axial fluid collection is evident.  Unchanged left frontoparietal resection site. No evidence of new or progressive intracranial metastatic disease.    COGNITION: Overall cognitive status: Within functional limits for tasks assessed   SENSATION: WFL  COORDINATION: Some decreased motor control on RLE    MUSCLE LENGTH:  Some tightness in R hip, BL hamstrings, and in R ankle   POSTURE: rounded shoulders  LOWER EXTREMITY ROM:   all WFL, except R ankle DF still limited to 10d    LOWER EXTREMITY MMT:  grossly 5/5    FUNCTIONAL TESTS:   9 reps  30 seconds chair stand test Berg Balance Scale: 50/56  PATIENT SURVEYS:  ABC scale 65%                                                                                                                              TREATMENT DATE:  10/29/23 NuStep L5x26mins Calf raises 2x12 Toe raises 2x12  Calf stretch 30s  Cone taps  Side steps on airex- CGA  Leg press 40# 2x10 Walking on beam   10/22/23 Walk outdoors 1 big lap  Rockerboard Step ups 6" Side steps on to airex ankleTB green 20 reps each way R  Resisted gait 30# 3way x4  establish HEP   10/09/23- EVAL    PATIENT EDUCATION: Education details: POC Person educated: Patient Education method: Explanation Education comprehension: verbalized understanding  HOME EXERCISE  PROGRAM: Walking, SLS, doing some exercises with bands  Access Code: [email or web address] URL: [email or web address] Date: 10/22/2023 Prepared by: Donavon Fudge  Exercises - Single Leg Stance  - 1 x daily - 7 x weekly - 2 sets - 10 hold - Tandem Stance  - 1 x daily - 7 x weekly - 2 sets - 10 reps - Sit to Stand  - 1 x daily - 7 x weekly - 2 sets - 10 reps - Heel Raises with Counter Support  - 1 x daily - 7 x weekly - 2 sets - 10 reps - Heel Toe Raises with Counter Support  - 1 x daily - 7 x weekly - 2 sets - 10 reps  GOALS: Goals reviewed with patient? Yes  SHORT TERM GOALS: Target date: 11/27/23  Patient will be independent with initial HEP. Baseline:  Goal status: INITIAL  2.  Patient will improve R ankle DF to 20d Baseline: 10d Goal status: INITIAL    LONG TERM GOALS: Target date: 01/15/24  Patient will be independent with advanced/ongoing HEP to improve outcomes and carryover.  Baseline:  Goal status: INITIAL  2.  Patient will be able stand SLS on RLE >10s on firm and foam surfaces Baseline: 5s at best on firm  Goal status: INITIAL  3.  Patient will be able to do at least 10 alternating taps on 6" in 20s  Baseline: 6 on 4" in 20s  Goal status: INITIAL   4.  Patient will demonstrate 12 reps or more with 30s chair test Baseline: 9 Goal status: INITIAL  5.  Patient will improve ABC confidence score to 75-80%  Baseline: 65%  Goal status: INITIAL   ASSESSMENT:  CLINICAL IMPRESSION: Patient continues to show good progress with balance and gait. She has increased her activity tolerance and is able to do more without as much fear. She still has a fear of falling when it comes to stairs and walking on the beam. Pt is starting a topical chemo Monday, will monitor how she feels. Can progress to try some more difficult balance and coordination tasks.   EVAL Patient is a 48 y.o. female who was seen today for physical therapy evaluation and treatment for aftercare  following brain surgery. Her surgery was 2 years ago but she began having complication recently and seizures. She still has decreased coordination with RLE, and unable to do equal movements with both sides. R ankle DF has returned significantly but still missing about 10d. Pt would like to increase her mobility and ability to do things with her RLE to feel more balance and coordinated. She will benefit from PT to work on her overall strength, balance, and coordination to improve her confidence out in the community.   OBJECTIVE IMPAIRMENTS: Abnormal gait, decreased balance, decreased coordination, and decreased ROM.   ACTIVITY LIMITATIONS: locomotion level  PARTICIPATION LIMITATIONS: community activity and yard work  PERSONAL FACTORS: Fitness, Past/current experiences, and Time since onset of injury/illness/exacerbation are also affecting patient's functional outcome.   REHAB POTENTIAL: Good  CLINICAL DECISION MAKING: Stable/uncomplicated  EVALUATION COMPLEXITY: Low  PLAN:  PT FREQUENCY: 1x/week  PT DURATION: 12 weeks  PLANNED INTERVENTIONS: 97110-Therapeutic exercises, 97530- Therapeutic activity, [id number]- Neuromuscular re-education, 97535- Self Care, 16109- Manual therapy, [phone]- Gait training, [phone]- Electrical stimulation (manual), [phone]- Ultrasound, Patient/Family education, Balance training, Stair training, Taping, Dry Needling, Joint mobilization, Spinal mobilization, Cryotherapy, and Moist heat  PLAN FOR NEXT SESSION: R ankle DF ROM, balance training, steps, foam surfaces, establish HEP    Donavon Fudge, PT 10/29/2023, 3:46 PM

## 2023-10-28 NOTE — Telephone Encounter (Signed)
-----   Message from [facility] PACI sent at 10/25/2023  8:49 AM EDT ----- SCCIS- right breast SCCIS- left bresat  Please discuss WLE for both vs considering topical 5FU BID for 6 weeks

## 2023-10-28 NOTE — Telephone Encounter (Signed)
 I called and went over the results for the patient.  I went over the treatment options and answered her questions regarding them.  She wanted to talk to her daughter before making a decision but stated she would call back.

## 2023-10-29 ENCOUNTER — Telehealth: Payer: Self-pay | Admitting: Dermatology

## 2023-10-29 ENCOUNTER — Other Ambulatory Visit: Payer: Self-pay | Admitting: Dermatology

## 2023-10-29 ENCOUNTER — Ambulatory Visit: Attending: Neurosurgery

## 2023-10-29 DIAGNOSIS — R293 Abnormal posture: Secondary | ICD-10-CM | POA: Insufficient documentation

## 2023-10-29 DIAGNOSIS — M6281 Muscle weakness (generalized): Secondary | ICD-10-CM | POA: Diagnosis present

## 2023-10-29 DIAGNOSIS — R2681 Unsteadiness on feet: Secondary | ICD-10-CM | POA: Diagnosis present

## 2023-10-29 DIAGNOSIS — R262 Difficulty in walking, not elsewhere classified: Secondary | ICD-10-CM | POA: Diagnosis present

## 2023-10-29 DIAGNOSIS — Z483 Aftercare following surgery for neoplasm: Secondary | ICD-10-CM | POA: Insufficient documentation

## 2023-10-29 DIAGNOSIS — R278 Other lack of coordination: Secondary | ICD-10-CM | POA: Diagnosis present

## 2023-10-29 DIAGNOSIS — R2689 Other abnormalities of gait and mobility: Secondary | ICD-10-CM | POA: Diagnosis present

## 2023-10-29 MED ORDER — FLUOROURACIL 5 % EX CREA: TOPICAL_CREAM | Freq: Two times a day (BID) | CUTANEOUS | 2 refills | Status: AC

## 2023-10-29 MED ORDER — FLUOROURACIL 5 % EX CREA
TOPICAL_CREAM | Freq: Two times a day (BID) | CUTANEOUS | 2 refills | Status: DC
Start: 2023-10-29 — End: 2023-10-29

## 2023-10-29 NOTE — Progress Notes (Signed)
 Patient wants to try cream for treatment for SSCIS on bilateral breat area

## 2023-10-29 NOTE — Telephone Encounter (Signed)
 Patient has decided to try using the cream to treat her SCCIS on her bilateral breast.  I went over all the instructions and answered all her questions. I sent in the RX to her preferred pharmacy and sent a pt message going over treatment, side effects and ways to help with those side effects.

## 2023-10-29 NOTE — Telephone Encounter (Signed)
-----   Message from [facility] PACI sent at 10/25/2023  8:49 AM EDT ----- SCCIS- right breast SCCIS- left bresat  Please discuss WLE for both vs considering topical 5FU BID for 6 weeks

## 2023-11-05 ENCOUNTER — Ambulatory Visit: Admitting: Physical Therapy

## 2023-11-05 ENCOUNTER — Encounter: Payer: Self-pay | Admitting: Physical Therapy

## 2023-11-05 DIAGNOSIS — M6281 Muscle weakness (generalized): Secondary | ICD-10-CM

## 2023-11-05 DIAGNOSIS — R2689 Other abnormalities of gait and mobility: Secondary | ICD-10-CM

## 2023-11-05 DIAGNOSIS — R262 Difficulty in walking, not elsewhere classified: Secondary | ICD-10-CM

## 2023-11-05 DIAGNOSIS — R2681 Unsteadiness on feet: Secondary | ICD-10-CM

## 2023-11-05 DIAGNOSIS — Z483 Aftercare following surgery for neoplasm: Secondary | ICD-10-CM | POA: Diagnosis not present

## 2023-11-05 NOTE — Therapy (Signed)
 OUTPATIENT PHYSICAL THERAPY NEURO TREATMENT   Patient Name: Sheryl Mcmillan MRN: [id number] DOB:1975-09-12, 48 y.o., female Today's Date: 11/05/2023   PCP: Lavona Pounds REFERRING PROVIDER: Augusto Blonder  END OF SESSION:  PT End of Session - 11/05/23 1531     Visit Number 4    Number of Visits 15    Date for PT Re-Evaluation 01/15/24    Authorization Type Medicaid    PT Start Time 1530    PT Stop Time 1612    PT Time Calculation (min) 42 min    Activity Tolerance Patient tolerated treatment well    Behavior During Therapy [facility] for tasks assessed/performed               Past Medical History:  Diagnosis Date   Arthritis    Breast cancer (HCC)    Family history of non-Hodgkin's lymphoma 06/01/2020   GERD (gastroesophageal reflux disease)    Hemorrhoids    Migraines    PCOS (polycystic ovarian syndrome)    PONV (postoperative nausea and vomiting)    Seizures (HCC)    Past Surgical History:  Procedure Laterality Date   APPLICATION OF CRANIAL NAVIGATION Left 11/10/2021   Procedure: APPLICATION OF CRANIAL NAVIGATION;  Surgeon: Augusto Blonder, MD;  Location: [facility] OR;  Service: Neurosurgery;  Laterality: Left;   AXILLARY SENTINEL NODE BIOPSY Left 11/07/2020   Procedure: LEFT AXILLARY SENTINEL NODE BIOPSY;  Surgeon: Enid Harry, MD;  Location: [facility] OR;  Service: General;  Laterality: Left;   BREAST CYST EXCISION Right 09/22/2021   Procedure: Excision of right axillary excess soft tissue.;  Surgeon: Barb Bonito, MD;  Location: [facility];  Service: Plastics;  Laterality: Right;   BREAST RECONSTRUCTION WITH PLACEMENT OF TISSUE EXPANDER AND FLEX HD (ACELLULAR HYDRATED DERMIS) Bilateral 11/07/2020   Procedure: BILATERAL BREAST RECONSTRUCTION WITH PLACEMENT OF TISSUE EXPANDER AND FLEX HD (ACELLULAR HYDRATED DERMIS);  Surgeon: Barb Bonito, MD;  Location: [facility] OR;  Service: Plastics;  Laterality: Bilateral;   COSMETIC SURGERY     CRANIOTOMY Left  11/10/2021   Procedure: LT FRONTAL CRANIOTOMY TUMOR EXCISION;  Surgeon: Augusto Blonder, MD;  [facility]: [facility];  Service: Neurosurgery;  Laterality: Left;   DILATION AND CURETTAGE OF UTERUS     HEMORRHOID SURGERY     IR IMAGING GUIDED PORT INSERTION  06/15/2020   LIPOSUCTION WITH LIPOFILLING Bilateral 09/22/2021   Procedure: Fat grafting bilateral breast;  Surgeon: Barb Bonito, MD;  Location: [facility];  Service: Plastics;  Laterality: Bilateral;   MASS EXCISION Bilateral 04/25/2021   Procedure: excision of bilateral axillary breast tissue;  Surgeon: Barb Bonito, MD;  Location: [facility];  Service: Plastics;  Laterality: Bilateral;   PORT-A-CATH REMOVAL Right 11/07/2020   Procedure: REMOVAL PORT-A-CATH;  Surgeon: Enid Harry, MD;  Location: [facility] OR;  Service: General;  Laterality: Right;   REMOVAL OF BILATERAL TISSUE EXPANDERS WITH PLACEMENT OF BILATERAL BREAST IMPLANTS Bilateral 04/25/2021   Procedure: REMOVAL OF BILATERAL TISSUE EXPANDERS WITH PLACEMENT OF BILATERAL BREAST IMPLANTS;  Surgeon: Barb Bonito, MD;  Location: [facility];  Service: Plastics;  Laterality: Bilateral;   ROBOTIC ASSISTED TOTAL HYSTERECTOMY WITH BILATERAL SALPINGO OOPHERECTOMY Bilateral 03/14/2021   Procedure: XI ROBOTIC ASSISTED TOTAL HYSTERECTOMY WITH BILATERAL SALPINGO OOPHORECTOMY;  Surgeon: Alphonso Aschoff, MD;  Location: [facility];  Service: Gynecology;  Laterality: Bilateral;   TOTAL MASTECTOMY Bilateral 11/07/2020   Procedure: BILATERAL MASTECTOMY;  Surgeon: Enid Harry, MD;  Location: [facility] OR;  Service: General;  Laterality: Bilateral;   Patient Active Problem List   Diagnosis Date Noted   Leukocytosis 04/12/2023   Hyperlipidemia 04/11/2023   Seizure (HCC) 04/11/2023   Brain tumor (HCC) 11/08/2021   Metastatic cancer to brain (HCC) 11/08/2021   Cerebral edema (HCC) 11/08/2021   Complex partial seizure (HCC) 11/08/2021   Brain mass 11/07/2021   Right  sided weakness 11/07/2021   IDA (iron deficiency anemia) 07/18/2021   Cholelithiasis 03/14/2021   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   Obesity, Class III, BMI 40-49.9 (morbid obesity) 07/19/2020   BRCA1 gene mutation positive 07/04/2020   Genetic testing 06/28/2020   Family history of non-Hodgkin's lymphoma 06/01/2020   Malignant neoplasm of upper-outer quadrant of left breast in female, estrogen receptor positive (HCC) 05/26/2020   Acute right ankle pain 05/16/2016   Nondisplaced fracture of lateral malleolus of right fibula, initial encounter for closed fracture 05/16/2016    ONSET DATE: 09/24/23  REFERRING DIAG:  C79.31 (ICD-10-CM) - Secondary malignant neoplasm of brain    THERAPY DIAG:  Aftercare following surgery for neoplasm  Unsteadiness on feet  Muscle weakness (generalized)  Other abnormalities of gait and mobility  Difficulty in walking, not elsewhere classified  Rationale for Evaluation and Treatment: Rehabilitation  SUBJECTIVE:                                                                                                                                                                                             SUBJECTIVE STATEMENT: I am doing okay, no falls or stumbles   EVAL: I started having seizures, 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 Last one was in October. I couldn't drive for 6 months. Have to go back next month for scans to check for brain activity and cancer. I feel good and better. The medicine is regulated now.  Pt accompanied by: self  PERTINENT HISTORY: hx of breast cancer and total masectomy, seizures, frontal craniotomy for tumor excision 11/10/21  PAIN:  Are you having pain? No  PRECAUTIONS: Fall  RED FLAGS: None   WEIGHT BEARING RESTRICTIONS: No  FALLS: Has patient fallen in last 6 months? No  LIVING ENVIRONMENT: Lives with: lives with their family Lives in: House/apartment Stairs: Yes: External: 3 steps;  none Has following equipment at home: Single point cane  PLOF: Independent  PATIENT GOALS: want to be more aligned, I can feel my gait is off. Want to be more balanced and coordinated   OBJECTIVE:  Note: Objective measures were completed at Evaluation unless otherwise noted.  DIAGNOSTIC FINDINGS: 07/08/23 Brain: Encephalomalacia and a small amount of chronic blood products are again noted at the  left frontoparietal resection site. A small amount of nonsuspicious enhancement at the resection site is unchanged. No new enhancing intracranial lesion is identified. The ventricles are normal in size. No acute infarct, midline shift, or extra-axial fluid collection is evident.  Unchanged left frontoparietal resection site. No evidence of new or progressive intracranial metastatic disease.    COGNITION: Overall cognitive status: Within functional limits for tasks assessed   SENSATION: WFL  COORDINATION: Some decreased motor control on RLE    MUSCLE LENGTH:  Some tightness in R hip, BL hamstrings, and in R ankle   POSTURE: rounded shoulders  LOWER EXTREMITY ROM:   all WFL, except R ankle DF still limited to 10d    LOWER EXTREMITY MMT:  grossly 5/5    FUNCTIONAL TESTS:   9 reps  30 seconds chair stand test Berg Balance Scale: 50/56  PATIENT SURVEYS:  ABC scale 65%                                                                                                                              TREATMENT DATE:  11/05/23 Nustep level 5 x 6 minutes On airex balance beam side stepping and tandem walking On airex volley ball On bosu reaching Direction changes On mat figure 8's On mat cone toe touches HS curls 35# 2x10, 20# right only 2x10 Leg extension 10# 2x10,  right 5# 2x10 Leg press 40# 2x10, then right only 20# x 10  10/29/23 NuStep L5x54mins Calf raises 2x12 Toe raises 2x12  Calf stretch 30s  Cone taps  Side steps on airex- CGA  Leg press 40# 2x10 Walking on  beam   10/22/23 Walk outdoors 1 big lap  Rockerboard Step ups 6" Side steps on to airex ankleTB green 20 reps each way R  Resisted gait 30# 3way x4  establish HEP   10/09/23- EVAL    PATIENT EDUCATION: Education details: POC Person educated: Patient Education method: Explanation Education comprehension: verbalized understanding  HOME EXERCISE PROGRAM: Walking, SLS, doing some exercises with bands  Access Code: [email or web address] URL: [email or web address] Date: 10/22/2023 Prepared by: Donavon Fudge  Exercises - Single Leg Stance  - 1 x daily - 7 x weekly - 2 sets - 10 hold - Tandem Stance  - 1 x daily - 7 x weekly - 2 sets - 10 reps - Sit to Stand  - 1 x daily - 7 x weekly - 2 sets - 10 reps - Heel Raises with Counter Support  - 1 x daily - 7 x weekly - 2 sets - 10 reps - Heel Toe Raises with Counter Support  - 1 x daily - 7 x weekly - 2 sets - 10 reps  GOALS: Goals reviewed with patient? Yes  SHORT TERM GOALS: Target date: 11/27/23  Patient will be independent with initial HEP. Baseline:  Goal status: met with walking program 11/05/23  2.  Patient will improve R ankle DF to 20d Baseline: 10d Goal status: INITIAL  LONG TERM GOALS: Target date: 01/15/24  Patient will be independent with advanced/ongoing HEP to improve outcomes and carryover.  Baseline:  Goal status: INITIAL  2.  Patient will be able stand SLS on RLE >10s on firm and foam surfaces Baseline: 5s at best on firm  Goal status: INITIAL  3.  Patient will be able to do at least 10 alternating taps on 6" in 20s  Baseline: 6 on 4" in 20s  Goal status: INITIAL   4.  Patient will demonstrate 12 reps or more with 30s chair test Baseline: 9 Goal status: INITIAL  5.  Patient will improve ABC confidence score to 75-80%  Baseline: 65%  Goal status: INITIAL   ASSESSMENT:  CLINICAL IMPRESSION: Patient continues to show good progress with balance and gait. She reports difficulty with walking on  uneven terrain and this is the only thing that scares her or she tried to avoid, we did a little more today with this and then with strength trying to assure the right leg was active  EVAL Patient is a 48 y.o. female who was seen today for physical therapy evaluation and treatment for aftercare following brain surgery. Her surgery was 2 years ago but she began having complication recently and seizures. She still has decreased coordination with RLE, and unable to do equal movements with both sides. R ankle DF has returned significantly but still missing about 10d. Pt would like to increase her mobility and ability to do things with her RLE to feel more balance and coordinated. She will benefit from PT to work on her overall strength, balance, and coordination to improve her confidence out in the community.   OBJECTIVE IMPAIRMENTS: Abnormal gait, decreased balance, decreased coordination, and decreased ROM.   ACTIVITY LIMITATIONS: locomotion level  PARTICIPATION LIMITATIONS: community activity and yard work  PERSONAL FACTORS: Fitness, Past/current experiences, and Time since onset of injury/illness/exacerbation are also affecting patient's functional outcome.   REHAB POTENTIAL: Good  CLINICAL DECISION MAKING: Stable/uncomplicated  EVALUATION COMPLEXITY: Low  PLAN:  PT FREQUENCY: 1x/week  PT DURATION: 12 weeks  PLANNED INTERVENTIONS: 97110-Therapeutic exercises, 97530- Therapeutic activity, [id number]- Neuromuscular re-education, 97535- Self Care, 69629- Manual therapy, [id number]- Gait training, [phone]- Electrical stimulation (manual), [phone]- Ultrasound, Patient/Family education, Balance training, Stair training, Taping, Dry Needling, Joint mobilization, Spinal mobilization, Cryotherapy, and Moist heat  PLAN FOR NEXT SESSION: R ankle DF ROM, balance training, steps, foam surfaces, establish HEP    Amish Mintzer W, PT 11/05/2023, 3:31 PM

## 2023-11-11 ENCOUNTER — Ambulatory Visit (HOSPITAL_COMMUNITY)
Admission: RE | Admit: 2023-11-11 | Discharge: 2023-11-11 | Disposition: A | Discharge status: Home / Self Care | Source: Ambulatory Visit | Attending: Hematology and Oncology | Admitting: Hematology and Oncology

## 2023-11-11 ENCOUNTER — Inpatient Hospital Stay: Payer: Medicaid Other | Attending: Internal Medicine

## 2023-11-11 DIAGNOSIS — C7931 Secondary malignant neoplasm of brain: Secondary | ICD-10-CM | POA: Insufficient documentation

## 2023-11-11 DIAGNOSIS — Z9079 Acquired absence of other genital organ(s): Secondary | ICD-10-CM | POA: Diagnosis not present

## 2023-11-11 DIAGNOSIS — Z9011 Acquired absence of right breast and nipple: Secondary | ICD-10-CM | POA: Diagnosis not present

## 2023-11-11 DIAGNOSIS — Z170 Estrogen receptor positive status [ER+]: Secondary | ICD-10-CM | POA: Insufficient documentation

## 2023-11-11 DIAGNOSIS — C50412 Malignant neoplasm of upper-outer quadrant of left female breast: Secondary | ICD-10-CM | POA: Insufficient documentation

## 2023-11-11 DIAGNOSIS — Z85828 Personal history of other malignant neoplasm of skin: Secondary | ICD-10-CM | POA: Diagnosis not present

## 2023-11-11 DIAGNOSIS — R911 Solitary pulmonary nodule: Secondary | ICD-10-CM | POA: Insufficient documentation

## 2023-11-11 DIAGNOSIS — Z90722 Acquired absence of ovaries, bilateral: Secondary | ICD-10-CM | POA: Insufficient documentation

## 2023-11-11 LAB — CMP (CANCER CENTER ONLY)
ALT: 20 U/L (ref 0–44)
AST: 12 U/L — ABNORMAL LOW (ref 15–41)
Albumin: 4.1 g/dL (ref 3.5–5.0)
Alkaline Phosphatase: 93 U/L (ref 38–126)
Anion gap: 8 (ref 5–15)
BUN: 11 mg/dL (ref 6–20)
CO2: 23 mmol/L (ref 22–32)
Calcium: 8.7 mg/dL — ABNORMAL LOW (ref 8.9–10.3)
Chloride: 111 mmol/L (ref 98–111)
Creatinine: 0.64 mg/dL (ref 0.44–1.00)
GFR, Estimated: >60 mL/min (ref >60)
Glucose, Bld: 143 mg/dL — ABNORMAL HIGH (ref 70–99)
Potassium: 4 mmol/L (ref 3.5–5.1)
Sodium: 142 mmol/L (ref 135–145)
Total Bilirubin: 0.3 mg/dL (ref 0.0–1.2)
Total Protein: 6.6 g/dL (ref 6.5–8.1)

## 2023-11-11 LAB — CBC WITH DIFFERENTIAL (CANCER CENTER ONLY)
Abs Immature Granulocytes: 0.02 10*3/uL (ref 0.00–0.07)
Basophils Absolute: 0.1 10*3/uL (ref 0.0–0.1)
Basophils Relative: 1 %
Eosinophils Absolute: 0.2 10*3/uL (ref 0.0–0.5)
Eosinophils Relative: 3 %
HCT: 40.5 % (ref 36.0–46.0)
Hemoglobin: 13.5 g/dL (ref 12.0–15.0)
Immature Granulocytes: 0 %
Lymphocytes Relative: 42 %
Lymphs Abs: 3.8 10*3/uL (ref 0.7–4.0)
MCH: 27.9 pg (ref 26.0–34.0)
MCHC: 33.3 g/dL (ref 30.0–36.0)
MCV: 83.7 fL (ref 80.0–100.0)
Monocytes Absolute: 0.5 10*3/uL (ref 0.1–1.0)
Monocytes Relative: 6 %
Neutro Abs: 4.6 10*3/uL (ref 1.7–7.7)
Neutrophils Relative %: 48 %
Platelet Count: 287 10*3/uL (ref 150–400)
RBC: 4.84 MIL/uL (ref 3.87–5.11)
RDW: 13.9 % (ref 11.5–15.5)
WBC Count: 9.2 10*3/uL (ref 4.0–10.5)
nRBC: 0 % (ref 0.0–0.2)

## 2023-11-11 MED ORDER — IOHEXOL 300 MG/ML  SOLN
100.0000 mL | Freq: Once | INTRAMUSCULAR | Status: AC | PRN
  Administered 2023-11-11: 100 mL via INTRAVENOUS

## 2023-11-11 MED ORDER — SODIUM CHLORIDE (PF) 0.9 % IJ SOLN
INTRAMUSCULAR | Status: AC
Start: 2023-11-11 — End: ?
  Filled 2023-11-11: qty 50

## 2023-11-12 ENCOUNTER — Ambulatory Visit: Admitting: Physical Therapy

## 2023-11-12 ENCOUNTER — Encounter: Payer: Self-pay | Admitting: Physical Therapy

## 2023-11-12 DIAGNOSIS — R262 Difficulty in walking, not elsewhere classified: Secondary | ICD-10-CM

## 2023-11-12 DIAGNOSIS — R2681 Unsteadiness on feet: Secondary | ICD-10-CM

## 2023-11-12 DIAGNOSIS — Z483 Aftercare following surgery for neoplasm: Secondary | ICD-10-CM | POA: Diagnosis not present

## 2023-11-12 DIAGNOSIS — R293 Abnormal posture: Secondary | ICD-10-CM

## 2023-11-12 DIAGNOSIS — M6281 Muscle weakness (generalized): Secondary | ICD-10-CM

## 2023-11-12 NOTE — Therapy (Signed)
 OUTPATIENT PHYSICAL THERAPY NEURO TREATMENT   Patient Name: Sheryl Mcmillan MRN: [id number] DOB:1976/02/12, 48 y.o., female Today's Date: 11/12/2023   PCP: Lavona Pounds REFERRING PROVIDER: Augusto Blonder  END OF SESSION:  PT End of Session - 11/12/23 1155     Visit Number 5    Number of Visits 15    Date for PT Re-Evaluation 01/15/24    Authorization Type Medicaid    PT Start Time 1055    PT Stop Time 1141    PT Time Calculation (min) 46 min    Activity Tolerance Patient tolerated treatment well    Behavior During Therapy [facility] for tasks assessed/performed                Past Medical History:  Diagnosis Date   Arthritis    Breast cancer (HCC)    Family history of non-Hodgkin's lymphoma 06/01/2020   GERD (gastroesophageal reflux disease)    Hemorrhoids    Migraines    PCOS (polycystic ovarian syndrome)    PONV (postoperative nausea and vomiting)    Seizures (HCC)    Past Surgical History:  Procedure Laterality Date   APPLICATION OF CRANIAL NAVIGATION Left 11/10/2021   Procedure: APPLICATION OF CRANIAL NAVIGATION;  Surgeon: Augusto Blonder, MD;  Location: [facility] OR;  Service: Neurosurgery;  Laterality: Left;   AXILLARY SENTINEL NODE BIOPSY Left 11/07/2020   Procedure: LEFT AXILLARY SENTINEL NODE BIOPSY;  Surgeon: Enid Harry, MD;  Location: [facility] OR;  Service: General;  Laterality: Left;   BREAST CYST EXCISION Right 09/22/2021   Procedure: Excision of right axillary excess soft tissue.;  Surgeon: Barb Bonito, MD;  Location: [facility];  Service: Plastics;  Laterality: Right;   BREAST RECONSTRUCTION WITH PLACEMENT OF TISSUE EXPANDER AND FLEX HD (ACELLULAR HYDRATED DERMIS) Bilateral 11/07/2020   Procedure: BILATERAL BREAST RECONSTRUCTION WITH PLACEMENT OF TISSUE EXPANDER AND FLEX HD (ACELLULAR HYDRATED DERMIS);  Surgeon: Barb Bonito, MD;  Location: [facility] OR;  Service: Plastics;  Laterality: Bilateral;   COSMETIC SURGERY     CRANIOTOMY Left  11/10/2021   Procedure: LT FRONTAL CRANIOTOMY TUMOR EXCISION;  Surgeon: Augusto Blonder, MD;  Location: [facility];  Service: Neurosurgery;  Laterality: Left;   DILATION AND CURETTAGE OF UTERUS     HEMORRHOID SURGERY     IR IMAGING GUIDED PORT INSERTION  06/15/2020   LIPOSUCTION WITH LIPOFILLING Bilateral 09/22/2021   Procedure: Fat grafting bilateral breast;  Surgeon: Barb Bonito, MD;  Location: [facility];  Service: Plastics;  Laterality: Bilateral;   MASS EXCISION Bilateral 04/25/2021   Procedure: excision of bilateral axillary breast tissue;  Surgeon: Barb Bonito, MD;  Location: [facility];  Service: Plastics;  Laterality: Bilateral;   PORT-A-CATH REMOVAL Right 11/07/2020   Procedure: REMOVAL PORT-A-CATH;  Surgeon: Enid Harry, MD;  Location: [facility] OR;  Service: General;  Laterality: Right;   REMOVAL OF BILATERAL TISSUE EXPANDERS WITH PLACEMENT OF BILATERAL BREAST IMPLANTS Bilateral 04/25/2021   Procedure: REMOVAL OF BILATERAL TISSUE EXPANDERS WITH PLACEMENT OF BILATERAL BREAST IMPLANTS;  Surgeon: Barb Bonito, MD;  Location: [facility];  Service: Plastics;  Laterality: Bilateral;   ROBOTIC ASSISTED TOTAL HYSTERECTOMY WITH BILATERAL SALPINGO OOPHERECTOMY Bilateral 03/14/2021   Procedure: XI ROBOTIC ASSISTED TOTAL HYSTERECTOMY WITH BILATERAL SALPINGO OOPHORECTOMY;  Surgeon: Alphonso Aschoff, MD;  Location: [facility];  Service: Gynecology;  Laterality: Bilateral;   TOTAL MASTECTOMY Bilateral 11/07/2020   Procedure: BILATERAL MASTECTOMY;  Surgeon: Enid Harry, MD;  Location: [facility] OR;  Service: General;  Laterality: Bilateral;   Patient Active Problem List   Diagnosis Date Noted   Leukocytosis 04/12/2023   Hyperlipidemia 04/11/2023   Seizure (HCC) 04/11/2023   Brain tumor (HCC) 11/08/2021   Metastatic cancer to brain (HCC) 11/08/2021   Cerebral edema (HCC) 11/08/2021   Complex partial seizure (HCC) 11/08/2021   Brain mass 11/07/2021   Right  sided weakness 11/07/2021   IDA (iron deficiency anemia) 07/18/2021   Cholelithiasis 03/14/2021   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   Obesity, Class III, BMI 40-49.9 (morbid obesity) 07/19/2020   BRCA1 gene mutation positive 07/04/2020   Genetic testing 06/28/2020   Family history of non-Hodgkin's lymphoma 06/01/2020   Malignant neoplasm of upper-outer quadrant of left breast in female, estrogen receptor positive (HCC) 05/26/2020   Acute right ankle pain 05/16/2016   Nondisplaced fracture of lateral malleolus of right fibula, initial encounter for closed fracture 05/16/2016    ONSET DATE: 09/24/23  REFERRING DIAG:  C79.31 (ICD-10-CM) - Secondary malignant neoplasm of brain    THERAPY DIAG:  Unsteadiness on feet  Muscle weakness (generalized)  Difficulty in walking, not elsewhere classified  Abnormal posture  Rationale for Evaluation and Treatment: Rehabilitation  SUBJECTIVE:                                                                                                                                                                                             SUBJECTIVE STATEMENT: Doing well   EVAL: I started having seizures, 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 Last one was in October. I couldn't drive for 6 months. Have to go back next month for scans to check for brain activity and cancer. I feel good and better. The medicine is regulated now.  Pt accompanied by: self  PERTINENT HISTORY: hx of breast cancer and total masectomy, seizures, frontal craniotomy for tumor excision 11/10/21  PAIN:  Are you having pain? No  PRECAUTIONS: Fall  RED FLAGS: None   WEIGHT BEARING RESTRICTIONS: No  FALLS: Has patient fallen in last 6 months? No  LIVING ENVIRONMENT: Lives with: lives with their family Lives in: House/apartment Stairs: Yes: External: 3 steps; none Has following equipment at home: Single point cane  PLOF: Independent  PATIENT GOALS: want  to be more aligned, I can feel my gait is off. Want to be more balanced and coordinated   OBJECTIVE:  Note: Objective measures were completed at Evaluation unless otherwise noted.  DIAGNOSTIC FINDINGS: 07/08/23 Brain: Encephalomalacia and a small amount of chronic blood products are again noted at the left frontoparietal resection site. A small amount of nonsuspicious enhancement at the resection site is unchanged.  No new enhancing intracranial lesion is identified. The ventricles are normal in size. No acute infarct, midline shift, or extra-axial fluid collection is evident.  Unchanged left frontoparietal resection site. No evidence of new or progressive intracranial metastatic disease.    COGNITION: Overall cognitive status: Within functional limits for tasks assessed   SENSATION: WFL  COORDINATION: Some decreased motor control on RLE    MUSCLE LENGTH:  Some tightness in R hip, BL hamstrings, and in R ankle   POSTURE: rounded shoulders  LOWER EXTREMITY ROM:   all WFL, except R ankle DF still limited to 10d    LOWER EXTREMITY MMT:  grossly 5/5    FUNCTIONAL TESTS:   9 reps  30 seconds chair stand test Berg Balance Scale: 50/56  PATIENT SURVEYS:  ABC scale 65%                                                                                                                              TREATMENT DATE:  11/12/23 Gait outside 2 laps no rest good pace, some SOB Feet on ball K2C, rotation, bridges, isometric abs Passive piriformis stretch Black tband clamshells Bridges 3# on top of right foot DF, then some DF with eversion Standing calf raises Leg press 40# 2x10, then right only 3x 5  Walking ball toss  11/05/23 Nustep level 5 x 6 minutes On airex balance beam side stepping and tandem walking On airex volley ball On bosu reaching Direction changes On mat figure 8's On mat cone toe touches HS curls 35# 2x10, 20# right only 2x10 Leg extension 10# 2x10,  right 5#  2x10 Leg press 40# 2x10, then right only 20# x 10  10/29/23 NuStep L5x55mins Calf raises 2x12 Toe raises 2x12  Calf stretch 30s  Cone taps  Side steps on airex- CGA  Leg press 40# 2x10 Walking on beam   10/22/23 Walk outdoors 1 big lap  Rockerboard Step ups 6" Side steps on to airex ankleTB green 20 reps each way R  Resisted gait 30# 3way x4  establish HEP   10/09/23- EVAL    PATIENT EDUCATION: Education details: POC Person educated: Patient Education method: Explanation Education comprehension: verbalized understanding  HOME EXERCISE PROGRAM: Walking, SLS, doing some exercises with bands  Access Code: [email or web address] URL: [email or web address] Date: 10/22/2023 Prepared by: Donavon Fudge  Exercises - Single Leg Stance  - 1 x daily - 7 x weekly - 2 sets - 10 hold - Tandem Stance  - 1 x daily - 7 x weekly - 2 sets - 10 reps - Sit to Stand  - 1 x daily - 7 x weekly - 2 sets - 10 reps - Heel Raises with Counter Support  - 1 x daily - 7 x weekly - 2 sets - 10 reps - Heel Toe Raises with Counter Support  - 1 x daily - 7 x weekly - 2 sets - 10 reps  GOALS: Goals reviewed with patient? Yes  SHORT TERM GOALS: Target date: 11/27/23  Patient will be independent with initial HEP. Baseline:  Goal status: met with walking program 11/05/23  2.  Patient will improve R ankle DF to 20d Baseline: 10d Goal status:ongoing 11/12/23    LONG TERM GOALS: Target date: 01/15/24  Patient will be independent with advanced/ongoing HEP to improve outcomes and carryover.  Baseline:  Goal status: INITIAL  2.  Patient will be able stand SLS on RLE >10s on firm and foam surfaces Baseline: 5s at best on firm  Goal status: INITIAL  3.  Patient will be able to do at least 10 alternating taps on 6" in 20s  Baseline: 6 on 4" in 20s  Goal status: INITIAL   4.  Patient will demonstrate 12 reps or more with 30s chair test Baseline: 9 Goal status: INITIAL  5.  Patient will improve ABC  confidence score to 75-80%  Baseline: 65%  Goal status: INITIAL   ASSESSMENT:  CLINICAL IMPRESSION: Patient tolerated the walk well, some louder foot hit with going down the hill, she was fatigued coming up the hill, SOB and reported that her hips hurt some.  We did some hip stretching and exercises that she said felt really good, the ability for her to DF is much improved with good control.  The right hip is tight and a little weak.  EVAL Patient is a 48 y.o. female who was seen today for physical therapy evaluation and treatment for aftercare following brain surgery. Her surgery was 2 years ago but she began having complication recently and seizures. She still has decreased coordination with RLE, and unable to do equal movements with both sides. R ankle DF has returned significantly but still missing about 10d. Pt would like to increase her mobility and ability to do things with her RLE to feel more balance and coordinated. She will benefit from PT to work on her overall strength, balance, and coordination to improve her confidence out in the community.   OBJECTIVE IMPAIRMENTS: Abnormal gait, decreased balance, decreased coordination, and decreased ROM.   ACTIVITY LIMITATIONS: locomotion level  PARTICIPATION LIMITATIONS: community activity and yard work  PERSONAL FACTORS: Fitness, Past/current experiences, and Time since onset of injury/illness/exacerbation are also affecting patient's functional outcome.   REHAB POTENTIAL: Good  CLINICAL DECISION MAKING: Stable/uncomplicated  EVALUATION COMPLEXITY: Low  PLAN:  PT FREQUENCY: 1x/week  PT DURATION: 12 weeks  PLANNED INTERVENTIONS: 97110-Therapeutic exercises, 97530- Therapeutic activity, [id number]- Neuromuscular re-education, 97535- Self Care, 65784- Manual therapy, [id number]- Gait training, [phone]- Electrical stimulation (manual), [phone]- Ultrasound, Patient/Family education, Balance training, Stair training, Taping, Dry Needling, Joint  mobilization, Spinal mobilization, Cryotherapy, and Moist heat  PLAN FOR NEXT SESSION: R ankle DF ROM, balance training, steps, foam surfaces, establish HEP hip strength, overall function and strength   Brisia Schuermann W, PT 11/12/2023, 11:56 AM

## 2023-11-14 ENCOUNTER — Inpatient Hospital Stay: Admission: RE | Admit: 2023-11-14 | Payer: Medicaid Other | Source: Ambulatory Visit

## 2023-11-14 ENCOUNTER — Telehealth: Payer: Self-pay | Admitting: *Deleted

## 2023-11-14 MED ORDER — LORAZEPAM 1 MG PO TABS: ORAL_TABLET | ORAL | 0 refills | Status: AC

## 2023-11-14 NOTE — Addendum Note (Signed)
 Addended by: Theadore Blunck K on: 11/14/2023 02:20 PM   Modules accepted: Orders

## 2023-11-14 NOTE — Telephone Encounter (Signed)
 Patient called to advise that she had to reschedule her MRI.  When she got there she realized she didn't have her Ativan  for claustrophia.  Upon returning home she was unable to locate the previous prescription of Ativan .  Asking for new Rx to [facility] for Ativan .  MRI is rescheduled for 11/27/2023.

## 2023-11-19 ENCOUNTER — Ambulatory Visit: Payer: Medicaid Other | Admitting: Internal Medicine

## 2023-11-19 ENCOUNTER — Ambulatory Visit

## 2023-11-19 ENCOUNTER — Inpatient Hospital Stay (HOSPITAL_BASED_OUTPATIENT_CLINIC_OR_DEPARTMENT_OTHER): Payer: Medicaid Other | Admitting: Hematology and Oncology

## 2023-11-19 VITALS — BP 148/73 | HR 79 | Temp 98.5°F | Resp 18 | Ht 71.0 in | Wt 306.9 lb

## 2023-11-19 DIAGNOSIS — M6281 Muscle weakness (generalized): Secondary | ICD-10-CM

## 2023-11-19 DIAGNOSIS — C50412 Malignant neoplasm of upper-outer quadrant of left female breast: Secondary | ICD-10-CM | POA: Diagnosis not present

## 2023-11-19 DIAGNOSIS — R293 Abnormal posture: Secondary | ICD-10-CM

## 2023-11-19 DIAGNOSIS — Z170 Estrogen receptor positive status [ER+]: Secondary | ICD-10-CM

## 2023-11-19 DIAGNOSIS — R2681 Unsteadiness on feet: Secondary | ICD-10-CM

## 2023-11-19 DIAGNOSIS — Z483 Aftercare following surgery for neoplasm: Secondary | ICD-10-CM | POA: Diagnosis not present

## 2023-11-19 DIAGNOSIS — R262 Difficulty in walking, not elsewhere classified: Secondary | ICD-10-CM

## 2023-11-19 NOTE — Therapy (Signed)
 OUTPATIENT PHYSICAL THERAPY NEURO TREATMENT   Patient Name: Sheryl Mcmillan MRN: [id number] DOB:1975/08/23, 48 y.o., female Today's Date: 11/19/2023   PCP: Lavona Pounds REFERRING PROVIDER: Augusto Blonder  END OF SESSION:  PT End of Session - 11/19/23 1229     Visit Number 6    Number of Visits 15    Date for PT Re-Evaluation 01/15/24    Authorization Type Medicaid    PT Start Time 1230    PT Stop Time 1315    PT Time Calculation (min) 45 min    Activity Tolerance Patient tolerated treatment well    Behavior During Therapy [facility] for tasks assessed/performed                 Past Medical History:  Diagnosis Date   Arthritis    Breast cancer (HCC)    Family history of non-Hodgkin's lymphoma 06/01/2020   GERD (gastroesophageal reflux disease)    Hemorrhoids    Migraines    PCOS (polycystic ovarian syndrome)    PONV (postoperative nausea and vomiting)    Seizures (HCC)    Past Surgical History:  Procedure Laterality Date   APPLICATION OF CRANIAL NAVIGATION Left 11/10/2021   Procedure: APPLICATION OF CRANIAL NAVIGATION;  Surgeon: Augusto Blonder, MD;  Location: [facility] OR;  Service: Neurosurgery;  Laterality: Left;   AXILLARY SENTINEL NODE BIOPSY Left 11/07/2020   Procedure: LEFT AXILLARY SENTINEL NODE BIOPSY;  Surgeon: Enid Harry, MD;  Location: [facility] OR;  Service: General;  Laterality: Left;   BREAST CYST EXCISION Right 09/22/2021   Procedure: Excision of right axillary excess soft tissue.;  Surgeon: Barb Bonito, MD;  Location: [facility];  Service: Plastics;  Laterality: Right;   BREAST RECONSTRUCTION WITH PLACEMENT OF TISSUE EXPANDER AND FLEX HD (ACELLULAR HYDRATED DERMIS) Bilateral 11/07/2020   Procedure: BILATERAL BREAST RECONSTRUCTION WITH PLACEMENT OF TISSUE EXPANDER AND FLEX HD (ACELLULAR HYDRATED DERMIS);  Surgeon: Barb Bonito, MD;  Location: [facility] OR;  Service: Plastics;  Laterality: Bilateral;   COSMETIC SURGERY     CRANIOTOMY Left  11/10/2021   Procedure: LT FRONTAL CRANIOTOMY TUMOR EXCISION;  Surgeon: Augusto Blonder, MD;  Location: [facility] OR;  Service: Neurosurgery;  Laterality: Left;   DILATION AND CURETTAGE OF UTERUS     HEMORRHOID SURGERY     IR IMAGING GUIDED PORT INSERTION  06/15/2020   LIPOSUCTION WITH LIPOFILLING Bilateral 09/22/2021   Procedure: Fat grafting bilateral breast;  Surgeon: Barb Bonito, MD;  Location: [facility];  Service: Plastics;  Laterality: Bilateral;   MASS EXCISION Bilateral 04/25/2021   Procedure: excision of bilateral axillary breast tissue;  Surgeon: Barb Bonito, MD;  Location: [facility];  Service: Plastics;  Laterality: Bilateral;   PORT-A-CATH REMOVAL Right 11/07/2020   Procedure: REMOVAL PORT-A-CATH;  Surgeon: Enid Harry, MD;  Location: [facility] OR;  Service: General;  Laterality: Right;   REMOVAL OF BILATERAL TISSUE EXPANDERS WITH PLACEMENT OF BILATERAL BREAST IMPLANTS Bilateral 04/25/2021   Procedure: REMOVAL OF BILATERAL TISSUE EXPANDERS WITH PLACEMENT OF BILATERAL BREAST IMPLANTS;  Surgeon: Barb Bonito, MD;  Location: [facility];  Service: Plastics;  Laterality: Bilateral;   ROBOTIC ASSISTED TOTAL HYSTERECTOMY WITH BILATERAL SALPINGO OOPHERECTOMY Bilateral 03/14/2021   Procedure: XI ROBOTIC ASSISTED TOTAL HYSTERECTOMY WITH BILATERAL SALPINGO OOPHORECTOMY;  Surgeon: Alphonso Aschoff, MD;  Location: [facility];  Service: Gynecology;  Laterality: Bilateral;   TOTAL MASTECTOMY Bilateral 11/07/2020   Procedure: BILATERAL MASTECTOMY;  Surgeon: Enid Harry, MD;  Location: [facility] OR;  Service:  General;  Laterality: Bilateral;   Patient Active Problem List   Diagnosis Date Noted   Leukocytosis 04/12/2023   Hyperlipidemia 04/11/2023   Seizure (HCC) 04/11/2023   Brain tumor (HCC) 11/08/2021   Metastatic cancer to brain (HCC) 11/08/2021   Cerebral edema (HCC) 11/08/2021   Complex partial seizure (HCC) 11/08/2021   Brain mass 11/07/2021   Right  sided weakness 11/07/2021   IDA (iron deficiency anemia) 07/18/2021   Cholelithiasis 03/14/2021   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   Obesity, Class III, BMI 40-49.9 (morbid obesity) 07/19/2020   BRCA1 gene mutation positive 07/04/2020   Genetic testing 06/28/2020   Family history of non-Hodgkin's lymphoma 06/01/2020   Malignant neoplasm of upper-outer quadrant of left breast in female, estrogen receptor positive (HCC) 05/26/2020   Acute right ankle pain 05/16/2016   Nondisplaced fracture of lateral malleolus of right fibula, initial encounter for closed fracture 05/16/2016    ONSET DATE: 09/24/23  REFERRING DIAG:  C79.31 (ICD-10-CM) - Secondary malignant neoplasm of brain    THERAPY DIAG:  Unsteadiness on feet  Muscle weakness (generalized)  Difficulty in walking, not elsewhere classified  Abnormal posture  Rationale for Evaluation and Treatment: Rehabilitation  SUBJECTIVE:                                                                                                                                                                                             SUBJECTIVE STATEMENT: Doing great   EVAL: I started having seizures, 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 Last one was in October. I couldn't drive for 6 months. Have to go back next month for scans to check for brain activity and cancer. I feel good and better. The medicine is regulated now.  Pt accompanied by: self  PERTINENT HISTORY: hx of breast cancer and total masectomy, seizures, frontal craniotomy for tumor excision 11/10/21  PAIN:  Are you having pain? No  PRECAUTIONS: Fall  RED FLAGS: None   WEIGHT BEARING RESTRICTIONS: No  FALLS: Has patient fallen in last 6 months? No  LIVING ENVIRONMENT: Lives with: lives with their family Lives in: House/apartment Stairs: Yes: External: 3 steps; none Has following equipment at home: Single point cane  PLOF: Independent  PATIENT GOALS: want  to be more aligned, I can feel my gait is off. Want to be more balanced and coordinated   OBJECTIVE:  Note: Objective measures were completed at Evaluation unless otherwise noted.  DIAGNOSTIC FINDINGS: 07/08/23 Brain: Encephalomalacia and a small amount of chronic blood products are again noted at the left frontoparietal resection site. A small amount of nonsuspicious enhancement at the resection site  is unchanged. No new enhancing intracranial lesion is identified. The ventricles are normal in size. No acute infarct, midline shift, or extra-axial fluid collection is evident.  Unchanged left frontoparietal resection site. No evidence of new or progressive intracranial metastatic disease.    COGNITION: Overall cognitive status: Within functional limits for tasks assessed   SENSATION: WFL  COORDINATION: Some decreased motor control on RLE    MUSCLE LENGTH:  Some tightness in R hip, BL hamstrings, and in R ankle   POSTURE: rounded shoulders  LOWER EXTREMITY ROM:   all WFL, except R ankle DF still limited to 10d    LOWER EXTREMITY MMT:  grossly 5/5    FUNCTIONAL TESTS:   9 reps  30 seconds chair stand test Berg Balance Scale: 50/56  PATIENT SURVEYS:  ABC scale 65%                                                                                                                              TREATMENT DATE:  11/19/23 Treadmill 2%, 2 mph x37mins   Resisted gait 30# 4 way x4  Leg ext 15# 2x12 HS curls 35# 2x12 Small lunge on BOSU Standing on BOSU balance then reaching for numbers  Alternating taps on 6" SLS on firm RLE- 7s at best    11/12/23 Gait outside 2 laps no rest good pace, some SOB Feet on ball K2C, rotation, bridges, isometric abs Passive piriformis stretch Black tband clamshells Bridges 3# on top of right foot DF, then some DF with eversion Standing calf raises Leg press 40# 2x10, then right only 3x 5  Walking ball toss  11/05/23 Nustep level 5 x 6  minutes On airex balance beam side stepping and tandem walking On airex volley ball On bosu reaching Direction changes On mat figure 8's On mat cone toe touches HS curls 35# 2x10, 20# right only 2x10 Leg extension 10# 2x10,  right 5# 2x10 Leg press 40# 2x10, then right only 20# x 10  10/29/23 NuStep L5x76mins Calf raises 2x12 Toe raises 2x12  Calf stretch 30s  Cone taps  Side steps on airex- CGA  Leg press 40# 2x10 Walking on beam   10/22/23 Walk outdoors 1 big lap  Rockerboard Step ups 6" Side steps on to airex ankleTB green 20 reps each way R  Resisted gait 30# 3way x4  establish HEP   10/09/23- EVAL    PATIENT EDUCATION: Education details: POC Person educated: Patient Education method: Explanation Education comprehension: verbalized understanding  HOME EXERCISE PROGRAM: Walking, SLS, doing some exercises with bands  Access Code: [email or web address] URL: [email or web address] Date: 10/22/2023 Prepared by: Donavon Fudge  Exercises - Single Leg Stance  - 1 x daily - 7 x weekly - 2 sets - 10 hold - Tandem Stance  - 1 x daily - 7 x weekly - 2 sets - 10 reps - Sit to Stand  - 1 x daily - 7 x weekly - 2 sets - 10  reps - Heel Raises with Counter Support  - 1 x daily - 7 x weekly - 2 sets - 10 reps - Heel Toe Raises with Counter Support  - 1 x daily - 7 x weekly - 2 sets - 10 reps  GOALS: Goals reviewed with patient? Yes  SHORT TERM GOALS: Target date: 11/27/23  Patient will be independent with initial HEP. Baseline:  Goal status: met with walking program 11/05/23  2.  Patient will improve R ankle DF to 20d Baseline: 10d Goal status:ongoing 11/12/23    LONG TERM GOALS: Target date: 01/15/24  Patient will be independent with advanced/ongoing HEP to improve outcomes and carryover.  Baseline:  Goal status: INITIAL  2.  Patient will be able stand SLS on RLE >10s on firm and foam surfaces Baseline: 5s at best on firm  Goal status: IN PROGRESS 7s on firm  11/19/23  3.  Patient will be able to do at least 10 alternating taps on 6" in 20s  Baseline: 6 on 4" in 20s  Goal status: MET 11/19/23   4.  Patient will demonstrate 12 reps or more with 30s chair test Baseline: 9 Goal status: INITIAL  5.  Patient will improve ABC confidence score to 75-80%  Baseline: 65%  Goal status: INITIAL   ASSESSMENT:  CLINICAL IMPRESSION: Patient tolerated session well working on strength and balance. Some burning in foot with balance on BOSU. She has met one LTG goal so far and is progressing towards the others. SLS on the RLE is still difficult.   EVAL Patient is a 48 y.o. female who was seen today for physical therapy evaluation and treatment for aftercare following brain surgery. Her surgery was 2 years ago but she began having complication recently and seizures. She still has decreased coordination with RLE, and unable to do equal movements with both sides. R ankle DF has returned significantly but still missing about 10d. Pt would like to increase her mobility and ability to do things with her RLE to feel more balance and coordinated. She will benefit from PT to work on her overall strength, balance, and coordination to improve her confidence out in the community.   OBJECTIVE IMPAIRMENTS: Abnormal gait, decreased balance, decreased coordination, and decreased ROM.   ACTIVITY LIMITATIONS: locomotion level  PARTICIPATION LIMITATIONS: community activity and yard work  PERSONAL FACTORS: Fitness, Past/current experiences, and Time since onset of injury/illness/exacerbation are also affecting patient's functional outcome.   REHAB POTENTIAL: Good  CLINICAL DECISION MAKING: Stable/uncomplicated  EVALUATION COMPLEXITY: Low  PLAN:  PT FREQUENCY: 1x/week  PT DURATION: 12 weeks  PLANNED INTERVENTIONS: 97110-Therapeutic exercises, 97530- Therapeutic activity, [id number]- Neuromuscular re-education, 97535- Self Care, [id number]- Manual therapy, [id number]- Gait training,  [id number]- Electrical stimulation (manual), [phone]- Ultrasound, Patient/Family education, Balance training, Stair training, Taping, Dry Needling, Joint mobilization, Spinal mobilization, Cryotherapy, and Moist heat  PLAN FOR NEXT SESSION: R ankle DF ROM, balance training, steps, foam surfaces, establish HEP hip strength, overall function and strength   Donavon Fudge, PT 11/19/2023, 1:13 PM

## 2023-11-19 NOTE — Progress Notes (Signed)
 Patient Care Team: Patient, No Pcp Per as PCP - General (General Practice) Enid Harry, MD as Consulting Physician (General Surgery) Retta Caster, MD as Consulting Physician (Radiation Oncology) Barb Bonito, MD as Consulting Physician (Plastic Surgery) Cameron Cea, MD as Consulting Physician (Hematology and Oncology) Augusto Blonder, MD as Consulting Physician (Neurosurgery)  DIAGNOSIS:  Encounter Diagnosis  Name Primary?   Malignant neoplasm of upper-outer quadrant of left breast in female, estrogen receptor positive (HCC) Yes    SUMMARY OF ONCOLOGIC HISTORY: Oncology History  Malignant neoplasm of upper-outer quadrant of left breast in female, estrogen receptor positive (HCC)  05/26/2020 Initial Diagnosis   Malignant neoplasm of upper-outer quadrant of left breast in female, estrogen receptor positive (HCC)   06/01/2020 Cancer Staging   Staging form: Breast, AJCC 8th Edition - Clinical stage from 06/01/2020: Stage IIA (cT2, cN0, cM0, G3, ER+, PR-, HER2+) - Signed by Murleen Arms, MD on 11/21/2022 Stage prefix: Initial diagnosis Histologic grading system: 3 grade system   06/16/2020 - 09/30/2020 Chemotherapy      Patient is on Antibody Plan: BREAST TRASTUZUMAB  Q21D    06/16/2020 Genetic Testing   Positive genetic testing: 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  Variant of uncertain significance in MSH3 at c.2724A>G (Silent).  No other pathogenic or uncertain variants were reported in the [facility] Multi-Cancer Panel.  The report date is June 16, 2020.    The variant of uncertain significance (VUS) in MSH3 at c.2724A>G (Silent) has been reclassified to likely benign.  The change in variant classification was made as a result of re-review of evidence in light of new variant interpretation guidelines and/or new information. The amended report date is January 10, 2021.   The Multi-Cancer Panel offered by [facility] includes sequencing and/or  deletion duplication testing of the following 85 genes: AIP, ALK, APC, ATM, AXIN2,BAP1,  BARD1, BLM, BMPR1A, BRCA1, BRCA2, BRIP1, CASR, CDC73, CDH1, CDK4, CDKN1B, CDKN1C, CDKN2A (p14ARF), CDKN2A (p16INK4a), CEBPA, CHEK2, CTNNA1, DICER1, DIS3L2, EGFR (c.2369C>T, p.Thr790Met variant only), EPCAM (Deletion/duplication testing only), FH, FLCN, GATA2, GPC3, GREM1 (Promoter region deletion/duplication testing only), HOXB13 (c.251G>A, p.Gly84Glu), HRAS, KIT, MAX, MEN1, MET, MITF (c.952G>A, p.Glu318Lys variant only), MLH1, MSH2, MSH3, MSH6, MUTYH, NBN, NF1, NF2, NTHL1, PALB2, PDGFRA, PHOX2B, PMS2, POLD1, POLE, POT1, PRKAR1A, PTCH1, PTEN, RAD50, RAD51C, RAD51D, RB1, RECQL4, RET, RNF43, RUNX1, SDHAF2, SDHA (sequence changes only), SDHB, SDHC, SDHD, SMAD4, SMARCA4, SMARCB1, SMARCE1, STK11, SUFU, TERC, TERT, TMEM127, TP53, TSC1, TSC2, VHL, WRN and WT1.    10/20/2020 - 10/20/2020 Chemotherapy    Patient is on Treatment Plan: BREAST  DOCETAXEL  + CARBOPLATIN  + TRASTUZUMAB  + PERTUZUMAB   (TCHP) Q21D       10/20/2020 - 06/23/2021 Chemotherapy   Patient is on Treatment Plan : BREAST Trastuzumab  q21d     11/10/2021 - 11/10/2021 Radiation Therapy   Site Technique Total Dose (Gy) Dose per Fx (Gy) Completed Fx Beam Energies  Brain: Brain [id number] IMRT 18/18 18 1/1 6XFFF       CHIEF COMPLIANT: Follow-up to discuss results of scans  HISTORY OF PRESENT ILLNESS:  History of Present Illness Sheryl Mcmillan is a 48 year old female with breast cancer who presents for follow-up of a pulmonary nodule and skin cancer.  She has a stable four millimeter pulmonary nodule in the lung, unchanged in size on regular scans every four months. A movable mass on her chest is noted, which is not related to the pulmonary nodule and causes no pain.  In April 2025, she was diagnosed with squamous cell  carcinoma in situ, confirmed by biopsies of two skin spots, which were excised. A new spot has appeared since then.       ALLERGIES:  is allergic to carboplatin , other, and wound dressing adhesive.  MEDICATIONS:  Current Outpatient Medications  Medication Sig Dispense Refill   fluorouracil  (EFUDEX ) 5 % cream Apply topically 2 (two) times daily. Apply to upper bilateral breast area 2x per day for 6 weeks 40 g 2   levETIRAcetam  (KEPPRA ) 1000 MG tablet Take 1 tablet (1,000 mg total) by mouth 2 (two) times daily. 60 tablet 5   LORazepam  (ATIVAN ) 1 MG tablet Take one po 30 min prior to mri or radiation 7 tablet 0   No current facility-administered medications for this visit.    PHYSICAL EXAMINATION: ECOG PERFORMANCE STATUS: 1 - Symptomatic but completely ambulatory  Vitals:   11/19/23 0926  BP: (!) 148/73  Pulse: 79  Resp: 18  Temp: 98.5 F (36.9 C)  SpO2: 100%   Filed Weights   11/19/23 0926  Weight: (!) 306 lb 14.4 oz (139.2 kg)    LABORATORY DATA:  I have reviewed the data as listed    Latest Ref Rng & Units 11/11/2023    9:22 AM 04/24/2023    9:03 AM 04/11/2023    3:09 PM  CMP  Glucose 70 - 99 mg/dL 161  096  045   BUN 6 - 20 mg/dL 11  8  9    Creatinine 0.44 - 1.00 mg/dL 4.09  8.11  9.14   Sodium 135 - 145 mmol/L 142  140  138   Potassium 3.5 - 5.1 mmol/L 4.0  4.3  4.2   Chloride 98 - 111 mmol/L 111  109  100   CO2 22 - 32 mmol/L 23  21  24    Calcium  8.9 - 10.3 mg/dL 8.7  8.8  9.1   Total Protein 6.5 - 8.1 g/dL 6.6     Total Bilirubin 0.0 - 1.2 mg/dL 0.3     Alkaline Phos 38 - 126 U/L 93     AST 15 - 41 U/L 12     ALT 0 - 44 U/L 20       Lab Results  Component Value Date   WBC 9.2 11/11/2023   HGB 13.5 11/11/2023   HCT 40.5 11/11/2023   MCV 83.7 11/11/2023   PLT 287 11/11/2023   NEUTROABS 4.6 11/11/2023    ASSESSMENT & PLAN:  Malignant neoplasm of upper-outer quadrant of left breast in female, estrogen receptor positive (HCC) 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, PR negative, Ki-67 50%, HER2 positive, genetics: BRCA1 mutation 06/16/2020: Neoadjuvant  chemotherapy with TCH Perjeta  but Perjeta  was discontinued after cycle 1, Herceptin  maintenance completed December 2022 11/07/2020: Bilateral mastectomies: Residual grade 3 IDC left breast negative margins, triple negative with a Ki-67 of 15% 03/14/2021: Bilateral salpingo-oophorectomy   Hospitalization 11/17/2021-12/09/2021 partial seizures, brain MRI left frontal metastases resection of the tumor metastatic breast cancer HER2 positive: Right lower extremity weakness CT CAP 11/12/2021: No distant metastatic disease. CT CAP 05/18/22: mild non specific inc in para tracheal LN Nonspecific, stable 5 mm LUL nodule Bone scan: 05/18/22: L2 compression fracture, no bone mets CT chest done 08/22/2022: No evidence of metastatic disease, fatty liver Brain MRI 06/29/2022: New 2 mm metastasis right frontal lobe (SRS on 07/10/2022) 02/28/2023: CT CAP: No evidence of metastatic disease in CAP 11/11/2023: CT CAP: Stable prominent mediastinal lymph nodes, stable 4 mm lung nodule left, fatty liver  Brain MRI: Scheduled for 11/27/2023 Patient follows with Dr. Mark Sil   I reviewed the results of the CT scans which do not show any active metastatic disease. Plan to perform CT scans in 6 months again. I wrote a letter for her to get her tuition deferred because of her current health situation.  We will plan to do scans every 6 months.      Orders Placed This Encounter  Procedures   CT CHEST ABDOMEN PELVIS W CONTRAST    Standing Status:   Future    Expected Date:   05/21/2024    Expiration Date:   11/18/2024    If indicated for the ordered procedure, I authorize the administration of contrast media per Radiology protocol:   Yes    Does the patient have a contrast media/X-ray dye allergy?:   No    Is patient pregnant?:   No    Preferred imaging location?:   [facility]    Release to patient:   Immediate    If indicated for the ordered procedure, I authorize the administration of oral contrast media per  Radiology protocol:   Yes   CBC with Differential (Cancer Center Only)    Standing Status:   Future    Expiration Date:   11/18/2024   CMP (Cancer Center only)    Standing Status:   Future    Expiration Date:   11/18/2024   The patient has a good understanding of the overall plan. she agrees with it. she will call with any problems that may develop before the next visit here. Total time spent: 30 mins including face to face time and time spent for planning, charting and co-ordination of care   Viinay K Mansfield Dann, MD 11/19/23

## 2023-11-19 NOTE — Assessment & Plan Note (Signed)
 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 CT CAP 05/18/22: mild non specific inc in para tracheal LN Nonspecific, stable 5 mm LUL nodule Bone scan: 05/18/22: L2 compression fracture, no bone mets CT chest done 08/22/2022: No evidence of metastatic disease, fatty liver Brain MRI 06/29/2022: New 2 mm metastasis right frontal lobe (SRS on 07/10/2022) 02/28/2023: CT CAP: No evidence of metastatic disease in CAP 11/11/2023: CT CAP: Stable prominent mediastinal lymph nodes, stable 4 mm lung nodule left, fatty liver   Brain MRI: Scheduled for 11/27/2023 Patient follows with Dr. Mark Sil    We will plan to do scans every 6 months.

## 2023-11-26 ENCOUNTER — Ambulatory Visit

## 2023-11-27 ENCOUNTER — Ambulatory Visit
Admission: RE | Admit: 2023-11-27 | Discharge: 2023-11-27 | Disposition: A | Discharge status: Home / Self Care | Source: Ambulatory Visit | Attending: Internal Medicine | Admitting: Internal Medicine

## 2023-11-27 DIAGNOSIS — C7931 Secondary malignant neoplasm of brain: Secondary | ICD-10-CM

## 2023-11-27 MED ORDER — GADOPICLENOL 0.5 MMOL/ML IV SOLN
10.0000 mL | Freq: Once | INTRAVENOUS | Status: AC | PRN
  Administered 2023-11-27: 10 mL via INTRAVENOUS

## 2023-12-02 ENCOUNTER — Encounter

## 2023-12-02 ENCOUNTER — Inpatient Hospital Stay: Attending: Internal Medicine | Admitting: Internal Medicine

## 2023-12-02 VITALS — BP 127/90 | HR 87 | Temp 98.1°F | Resp 18 | Wt 303.9 lb

## 2023-12-02 DIAGNOSIS — C7931 Secondary malignant neoplasm of brain: Secondary | ICD-10-CM | POA: Diagnosis present

## 2023-12-02 DIAGNOSIS — Z87891 Personal history of nicotine dependence: Secondary | ICD-10-CM | POA: Diagnosis not present

## 2023-12-02 DIAGNOSIS — R569 Unspecified convulsions: Secondary | ICD-10-CM | POA: Insufficient documentation

## 2023-12-02 DIAGNOSIS — C50412 Malignant neoplasm of upper-outer quadrant of left female breast: Secondary | ICD-10-CM | POA: Insufficient documentation

## 2023-12-02 DIAGNOSIS — Z807 Family history of other malignant neoplasms of lymphoid, hematopoietic and related tissues: Secondary | ICD-10-CM | POA: Diagnosis not present

## 2023-12-02 DIAGNOSIS — L989 Disorder of the skin and subcutaneous tissue, unspecified: Secondary | ICD-10-CM | POA: Diagnosis not present

## 2023-12-02 DIAGNOSIS — Z170 Estrogen receptor positive status [ER+]: Secondary | ICD-10-CM | POA: Insufficient documentation

## 2023-12-02 DIAGNOSIS — Z803 Family history of malignant neoplasm of breast: Secondary | ICD-10-CM | POA: Diagnosis not present

## 2023-12-02 NOTE — Progress Notes (Signed)
 [facility] [address]  [place], [place] [address] [phone]   Interval Evaluation  Date of Service: 12/02/23 Patient Name: Sheryl Mcmillan Patient MRN: [id number] Patient DOB: 12-24-1975 Provider: Mamie Searles, MD  Identifying Statement:  Sheryl Mcmillan is a 48 y.o. female with Metastatic cancer to brain [facility])  Seizure (HCC)   Primary Cancer:  Oncologic History: Oncology History  Malignant neoplasm of upper-outer quadrant of left breast in female, estrogen receptor positive (HCC)  05/26/2020 Initial Diagnosis   Malignant neoplasm of upper-outer quadrant of left breast in female, estrogen receptor positive (HCC)   06/01/2020 Cancer Staging   Staging form: Breast, AJCC 8th Edition - Clinical stage from 06/01/2020: Stage IIA (cT2, cN0, cM0, G3, ER+, PR-, HER2+) - Signed by Murleen Arms, MD on 11/21/2022 Stage prefix: Initial diagnosis Histologic grading system: 3 grade system   06/16/2020 - 09/30/2020 Chemotherapy      Patient is on Antibody Plan: BREAST TRASTUZUMAB  Q21D    06/16/2020 Genetic Testing   Positive genetic testing: 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  Variant of uncertain significance in MSH3 at c.2724A>G (Silent).  No other pathogenic or uncertain variants were reported in the [facility] Multi-Cancer Panel.  The report date is June 16, 2020.    The variant of uncertain significance (VUS) in MSH3 at c.2724A>G (Silent) has been reclassified to likely benign.  The change in variant classification was made as a result of re-review of evidence in light of new variant interpretation guidelines and/or new information. The amended report date is January 10, 2021.   The Multi-Cancer Panel offered by [facility] includes sequencing and/or deletion duplication testing of the following 85 genes: AIP, ALK, APC, ATM, AXIN2,BAP1,  BARD1, BLM, BMPR1A, BRCA1, BRCA2, BRIP1, CASR, CDC73, CDH1, CDK4, CDKN1B, CDKN1C,  CDKN2A (p14ARF), CDKN2A (p16INK4a), CEBPA, CHEK2, CTNNA1, DICER1, DIS3L2, EGFR (c.2369C>T, p.Thr790Met variant only), EPCAM (Deletion/duplication testing only), FH, FLCN, GATA2, GPC3, GREM1 (Promoter region deletion/duplication testing only), HOXB13 (c.251G>A, p.Gly84Glu), HRAS, KIT, MAX, MEN1, MET, MITF (c.952G>A, p.Glu318Lys variant only), MLH1, MSH2, MSH3, MSH6, MUTYH, NBN, NF1, NF2, NTHL1, PALB2, PDGFRA, PHOX2B, PMS2, POLD1, POLE, POT1, PRKAR1A, PTCH1, PTEN, RAD50, RAD51C, RAD51D, RB1, RECQL4, RET, RNF43, RUNX1, SDHAF2, SDHA (sequence changes only), SDHB, SDHC, SDHD, SMAD4, SMARCA4, SMARCB1, SMARCE1, STK11, SUFU, TERC, TERT, TMEM127, TP53, TSC1, TSC2, VHL, WRN and WT1.    10/20/2020 - 10/20/2020 Chemotherapy    Patient is on Treatment Plan: BREAST  DOCETAXEL  + CARBOPLATIN  + TRASTUZUMAB  + PERTUZUMAB   (TCHP) Q21D       10/20/2020 - 06/23/2021 Chemotherapy   Patient is on Treatment Plan : BREAST Trastuzumab  q21d     11/10/2021 - 11/10/2021 Radiation Therapy   Site Technique Total Dose (Gy) Dose per Fx (Gy) Completed Fx Beam Energies  Brain: Brain [id number] IMRT 18/18 18 1/1 6XFFF      CNS Oncologic History 11/10/21: Pre-op SRS L frontal ([facility]y) 11/12/21: L frontal craniotomy, resection Nat Badger) 07/10/22: Salvage SRS R frontal Jeryl Moris)  Interval History: Sheryl Mcmillan presents today for follow up after recent MRI brain.  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  Remains on Keppra  1000mg  twice per day without issue, after not tolerating the Vimpat  switch.  Continues to follow with Dr. Gudena for breast cancer.  Prior (04/22/23): recent admission for seizures. Initially presented to neurologic attention on 04/07/23 while visiting family in [place] , described episode of right sided shaking, followed by weakness and confusion.  She was  started on Keppra , but the event recurred after returning to [facility].  Following an admission here,  Keppra  was increased to 1000mg  twice per day.  No recurrence of events since discharge ~10 days ago, but there have been significant mood effects.  She has been very irritable and lashing out at family.  Denies headaches.  Prior: Patient presents today following recent MRI brain.  She describes some periods of word finding difficulty.  This has improved since she stopped working and lowered her stress level.  No other new or progressive changes.  Denies seizures, headaches.  Medications: Current Outpatient Medications on File Prior to Visit  Medication Sig Dispense Refill   fluorouracil  (EFUDEX ) 5 % cream Apply topically 2 (two) times daily. Apply to upper bilateral breast area 2x per day for 6 weeks 40 g 2   levETIRAcetam  (KEPPRA ) 1000 MG tablet Take 1 tablet (1,000 mg total) by mouth 2 (two) times daily. 60 tablet 5   LORazepam  (ATIVAN ) 1 MG tablet Take one po 30 min prior to mri or radiation 7 tablet 0   No current facility-administered medications on file prior to visit.    Allergies:  Allergies  Allergen Reactions   Carboplatin  Hives and Itching    Carboplatin  reaction after dose #5 requiring Benadryl , Solumedrol, epinephrine , Claritin .   Other Hives    Baking Soda   Wound Dressing Adhesive Hives    Rips skin, prefers paper tape   Past Medical History:  Past Medical History:  Diagnosis Date   Arthritis    Breast cancer (HCC)    Family history of non-Hodgkin's lymphoma 06/01/2020   GERD (gastroesophageal reflux disease)    Hemorrhoids    Migraines    PCOS (polycystic ovarian syndrome)    PONV (postoperative nausea and vomiting)    Seizures (HCC)    Past Surgical History:  Past Surgical History:  Procedure Laterality Date   APPLICATION OF CRANIAL NAVIGATION Left 11/10/2021   Procedure: APPLICATION OF CRANIAL NAVIGATION;  Surgeon: Augusto Blonder, MD;  Location: [facility] OR;  Service: Neurosurgery;  Laterality: Left;   AXILLARY SENTINEL NODE BIOPSY Left 11/07/2020   Procedure:  LEFT AXILLARY SENTINEL NODE BIOPSY;  Surgeon: Enid Harry, MD;  Location: [facility] OR;  Service: General;  Laterality: Left;   BREAST CYST EXCISION Right 09/22/2021   Procedure: Excision of right axillary excess soft tissue.;  Surgeon: Barb Bonito, MD;  Location: [facility];  Service: Plastics;  Laterality: Right;   BREAST RECONSTRUCTION WITH PLACEMENT OF TISSUE EXPANDER AND FLEX HD (ACELLULAR HYDRATED DERMIS) Bilateral 11/07/2020   Procedure: BILATERAL BREAST RECONSTRUCTION WITH PLACEMENT OF TISSUE EXPANDER AND FLEX HD (ACELLULAR HYDRATED DERMIS);  Surgeon: Barb Bonito, MD;  Location: [facility] OR;  Service: Plastics;  Laterality: Bilateral;   COSMETIC SURGERY     CRANIOTOMY Left 11/10/2021   Procedure: LT FRONTAL CRANIOTOMY TUMOR EXCISION;  Surgeon: Augusto Blonder, MD;  Location: [facility] OR;  Service: Neurosurgery;  Laterality: Left;   DILATION AND CURETTAGE OF UTERUS     HEMORRHOID SURGERY     IR IMAGING GUIDED PORT INSERTION  06/15/2020   LIPOSUCTION WITH LIPOFILLING Bilateral 09/22/2021   Procedure: Fat grafting bilateral breast;  Surgeon: Barb Bonito, MD;  Location: [facility];  Service: Plastics;  Laterality: Bilateral;   MASS EXCISION Bilateral 04/25/2021   Procedure: excision of bilateral axillary breast tissue;  Surgeon: Barb Bonito, MD;  Location: [facility];  Service: Plastics;  Laterality: Bilateral;   PORT-A-CATH REMOVAL Right 11/07/2020  Procedure: REMOVAL PORT-A-CATH;  Surgeon: Enid Harry, MD;  Location: [facility] OR;  Service: General;  Laterality: Right;   REMOVAL OF BILATERAL TISSUE EXPANDERS WITH PLACEMENT OF BILATERAL BREAST IMPLANTS Bilateral 04/25/2021   Procedure: REMOVAL OF BILATERAL TISSUE EXPANDERS WITH PLACEMENT OF BILATERAL BREAST IMPLANTS;  Surgeon: Barb Bonito, MD;  Location: [facility];  Service: Plastics;  Laterality: Bilateral;   ROBOTIC ASSISTED TOTAL HYSTERECTOMY WITH BILATERAL SALPINGO  OOPHERECTOMY Bilateral 03/14/2021   Procedure: XI ROBOTIC ASSISTED TOTAL HYSTERECTOMY WITH BILATERAL SALPINGO OOPHORECTOMY;  Surgeon: Alphonso Aschoff, MD;  Location: [facility];  Service: Gynecology;  Laterality: Bilateral;   TOTAL MASTECTOMY Bilateral 11/07/2020   Procedure: BILATERAL MASTECTOMY;  Surgeon: Enid Harry, MD;  Location: [facility] OR;  Service: General;  Laterality: Bilateral;   Social History:  Social History   Socioeconomic History   Marital status: Single    Spouse name: Not on file   Number of children: 1   Years of education: Not on file   Highest education level: Some college, no degree  Occupational History   Not on file  Tobacco Use   Smoking status: Former    Current packs/day: 0.00    Average packs/day: 0.5 packs/day for 10.0 years (5.0 ttl pk-yrs)    Types: Cigarettes    Start date: 08/24/2010    Quit date: 08/23/2020    Years since quitting: 3.2    Passive exposure: Never   Smokeless tobacco: Never  Vaping Use   Vaping status: Never Used  Substance and Sexual Activity   Alcohol use: Not Currently    Comment: rare   Drug use: Yes    Types: Marijuana    Comment: Last use 11/07/21   Sexual activity: Yes    Birth control/protection: Surgical    Comment: Hysterectomy  Other Topics Concern   Not on file  Social History Narrative   Not on file   Social Drivers of Health   Financial Resource Strain: Not on file  Food Insecurity: No Food Insecurity (04/15/2023)   Hunger Vital Sign    Worried About Running Out of Food in the Last Year: Never true    Ran Out of Food in the Last Year: Never true  Transportation Needs: No Transportation Needs (04/15/2023)   PRAPARE - Administrator, Civil Service (Medical): No    Lack of Transportation (Non-Medical): No  Physical Activity: Not on file  Stress: Not on file  Social Connections: Not on file  Intimate Partner Violence: Not At Risk (04/11/2023)   Humiliation, Afraid, Rape, and Kick questionnaire    Fear  of Current or Ex-Partner: No    Emotionally Abused: No    Physically Abused: No    Sexually Abused: No   Family History:  Family History  Problem Relation Age of Onset   Hypertension Mother    Diabetes Mother    Cancer Mother 48       unknown primary   Other Mother        brain tumor; dx 29s   Non-Hodgkin's lymphoma Brother 40   Other Maternal Uncle 38       brain tumor   Breast cancer Other        MGM's niece; dx late 72s   Pancreatic cancer Neg Hx    Colon cancer Neg Hx    Endometrial cancer Neg Hx    Prostate cancer Neg Hx    Ovarian cancer Neg Hx     Review of Systems: Constitutional: Doesn't report fevers,  chills or abnormal weight loss Eyes: Doesn't report blurriness of vision Ears, nose, mouth, throat, and face: Doesn't report sore throat Respiratory: Doesn't report cough, dyspnea or wheezes Cardiovascular: Doesn't report palpitation, chest discomfort  Gastrointestinal:  Doesn't report nausea, constipation, diarrhea GU: Doesn't report incontinence Skin: Doesn't report skin rashes Neurological: Per HPI Musculoskeletal: Doesn't report joint pain Behavioral/Psych: Doesn't report anxiety  Physical Exam: Vitals:   12/02/23 1229  BP: (!) 127/90  Pulse: 87  Resp: 18  Temp: 98.1 F (36.7 C)  SpO2: 98%    KPS: 90. General: Alert, cooperative, pleasant, in no acute distress Head: Normal EENT: No conjunctival injection or scleral icterus.  Lungs: Resp effort normal Cardiac: Regular rate Abdomen: Non-distended abdomen Skin: No rashes cyanosis or petechiae. Extremities: No clubbing or edema  Neurologic Exam: Mental Status: Awake, alert, attentive to examiner. Oriented to self and environment. Language is fluent with intact comprehension.  Cranial Nerves: Visual acuity is grossly normal. Visual fields are full. Extra-ocular movements intact. No ptosis. Face is symmetric Motor: Tone and bulk are normal. Power is full in both arms and legs. Reflexes are  symmetric, no pathologic reflexes present.  Sensory: Intact to light touch Gait: Normal.   Labs: I have reviewed the data as listed    Component Value Date/Time   NA 142 11/11/2023 0922   NA 145 (H) 04/10/2023 0950   K 4.0 11/11/2023 0922   CL 111 11/11/2023 0922   CO2 23 11/11/2023 0922   GLUCOSE 143 (H) 11/11/2023 0922   BUN 11 11/11/2023 0922   BUN 10 04/10/2023 0950   CREATININE 0.64 11/11/2023 0922   CALCIUM  8.7 (L) 11/11/2023 0922   PROT 6.6 11/11/2023 0922   PROT 6.5 04/10/2023 0950   ALBUMIN 4.1 11/11/2023 0922   ALBUMIN 4.2 04/10/2023 0950   AST 12 (L) 11/11/2023 0922   ALT 20 11/11/2023 0922   ALKPHOS 93 11/11/2023 0922   BILITOT 0.3 11/11/2023 0922   GFRNONAA >60 11/11/2023 0922   GFRAA >60 06/06/2019 1205   Lab Results  Component Value Date   WBC 9.2 11/11/2023   NEUTROABS 4.6 11/11/2023   HGB 13.5 11/11/2023   HCT 40.5 11/11/2023   MCV 83.7 11/11/2023   PLT 287 11/11/2023    Imaging:  MR BRAIN W WO CONTRAST Result Date: 11/27/2023 CLINICAL DATA:  Brain/CNS neoplasm.  Assess treatment response. EXAM: MRI HEAD WITHOUT AND WITH CONTRAST TECHNIQUE: Multiplanar, multiecho pulse sequences of the brain and surrounding structures were obtained without and with intravenous contrast. CONTRAST:  10 cc Vueway  COMPARISON:  07/08/2023 FINDINGS: Brain: Diffusion imaging does not show any acute or subacute infarction or other cause of restricted diffusion. No focal abnormality affects the brainstem or cerebellum. Distant resection at the left frontoparietal vertex with residual and stable adjacent gliosis and small post treatment space. Mild irregular enhancement is stable. No sign of residual or recurrent disease. No new or second lesion is seen elsewhere. No hydrocephalus. No extra-axial fluid collection. Vascular: Major vessels at the base of the brain show flow. Skull and upper cervical spine: Negative Sinuses/Orbits: Mild mucosal inflammatory changes and sphenoid sinus  retention cyst as seen previously. Orbits negative. Other: None IMPRESSION: Stable examination. Previous resection at the left frontoparietal vertex with residual gliosis, stable post treatment enhancement and small post treatment space. No sign of residual or recurrent disease. No new or second lesion. Electronically Signed   By: Bettylou Brunner M.D.   On: 11/27/2023 13:25   CT CHEST ABDOMEN PELVIS W CONTRAST Result  Date: 11/13/2023 CLINICAL DATA:  Invasive stage IV breast cancer, assess treatment response. * Tracking Code: BO * EXAM: CT CHEST, ABDOMEN, AND PELVIS WITH CONTRAST TECHNIQUE: Multidetector CT imaging of the chest, abdomen and pelvis was performed following the standard protocol during bolus administration of intravenous contrast. RADIATION DOSE REDUCTION: This exam was performed according to the departmental dose-optimization program which includes automated exposure control, adjustment of the mA and/or kV according to patient size and/or use of iterative reconstruction technique. CONTRAST:  OMNIPAQUE  IOHEXOL  300 MG/ML  SOLN COMPARISON:  Multiple priors including most recent CT July 03, 2023 FINDINGS: CT CHEST FINDINGS Cardiovascular: No significant vascular findings. Normal heart size. No pericardial effusion. Mediastinum/Nodes: No suspicious thyroid nodule. No pathologically enlarged mediastinal, hilar or axillary lymph nodes. Stable prominent mediastinal lymph nodes. Similar appearance of the hilar remnant in the anterior mediastinum Lungs/Pleura: Stable 4 mm pulmonary nodule posterior left lung apex on image 13/4. No new suspicious pulmonary nodules or masses. Musculoskeletal: Prior bilateral mastectomy with breast reconstruction. No aggressive lytic or blastic lesion of bone. CT ABDOMEN PELVIS FINDINGS Hepatobiliary: Hepatic steatosis. Stable 3 cm nodularity in the central aspect of segment IV along the hilum on image 64/2 favored to reflect focal fatty sparing. Gallbladder surgically  absent. No biliary ductal dilation. Pancreas: No pancreatic ductal dilation or evidence of acute inflammation. Spleen: No splenomegaly. Adrenals/Urinary Tract: Bilateral adrenal glands appear normal. No hydronephrosis. Kidneys demonstrate symmetric enhancement. Urinary bladder is unremarkable for degree of distension. Stomach/Bowel: Stomach is under remarkable for degree of distension. No pathologic dilation of small or large bowel. Non flank appendix. No evidence of acute bowel inflammation. Vascular/Lymphatic: Normal caliber abdominal aorta. Smooth IVC contours. The portal, splenic and superior mesenteric veins are patent. No pathologically enlarged abdominal or pelvic lymph nodes. Reproductive: Status post hysterectomy. No adnexal masses. Other: No significant abdominopelvic free fluid. Musculoskeletal: No aggressive lytic or blastic lesion of bone. Multilevel degenerative changes spine. Chronic bilateral L5 pars defects with grade 1 anterolisthesis. Degenerative changes bilateral hips. IMPRESSION: 1. Stable examination without evidence of new or progressive disease in the chest, abdomen or pelvis. 2. Stable 4 mm pulmonary nodule in the posterior left lung apex. 3. Hepatic steatosis with stable 3 cm nodularity in the central aspect of segment IV along the hilum favored to reflect focal fatty sparing. Suggest attention on follow-up imaging. Electronically Signed   By: Tama Fails M.D.   On: 11/13/2023 15:23    [facility] Clinician Interpretation: I have personally reviewed the radiological images as listed.  My interpretation, in the context of the patient's clinical presentation, is stable disease   Assessment/Plan Metastatic cancer to brain [facility])  Seizure (HCC)  Skeeter Dukes Graefe is clinically stable today.  MRI brain demonstrates stable findings compared to prior study.  For seizures, ok to con't Keppra  1000mg  BID.  She will follow up with dermatology regarding her skin irritation.  We appreciate  the opportunity to participate in the care of [facility].   We ask that Verdel Gitelman return to clinic in 4 months following next brain MRI, or sooner as needed.  All questions were answered. The patient knows to call the clinic with any problems, questions or concerns. No barriers to learning were detected.  The total time spent in the encounter was 30 minutes and more than 50% was on counseling and review of test results   Mamie Searles, MD Medical Director of Neuro-[facility]cology [facility] 12/02/23 12:32 PM

## 2023-12-03 ENCOUNTER — Ambulatory Visit: Attending: Neurosurgery | Admitting: Physical Therapy

## 2023-12-03 ENCOUNTER — Encounter: Payer: Self-pay | Admitting: Physical Therapy

## 2023-12-03 ENCOUNTER — Telehealth: Payer: Self-pay

## 2023-12-03 ENCOUNTER — Encounter: Payer: Self-pay | Admitting: Dermatology

## 2023-12-03 ENCOUNTER — Ambulatory Visit: Admitting: Dermatology

## 2023-12-03 VITALS — BP 139/113 | HR 98

## 2023-12-03 DIAGNOSIS — Z483 Aftercare following surgery for neoplasm: Secondary | ICD-10-CM | POA: Diagnosis present

## 2023-12-03 DIAGNOSIS — R21 Rash and other nonspecific skin eruption: Secondary | ICD-10-CM

## 2023-12-03 DIAGNOSIS — Z86007 Personal history of in-situ neoplasm of skin: Secondary | ICD-10-CM | POA: Diagnosis not present

## 2023-12-03 DIAGNOSIS — M6281 Muscle weakness (generalized): Secondary | ICD-10-CM | POA: Insufficient documentation

## 2023-12-03 DIAGNOSIS — T451X5A Adverse effect of antineoplastic and immunosuppressive drugs, initial encounter: Secondary | ICD-10-CM

## 2023-12-03 DIAGNOSIS — R2681 Unsteadiness on feet: Secondary | ICD-10-CM | POA: Diagnosis present

## 2023-12-03 DIAGNOSIS — R293 Abnormal posture: Secondary | ICD-10-CM | POA: Diagnosis present

## 2023-12-03 DIAGNOSIS — R262 Difficulty in walking, not elsewhere classified: Secondary | ICD-10-CM | POA: Insufficient documentation

## 2023-12-03 DIAGNOSIS — D049 Carcinoma in situ of skin, unspecified: Secondary | ICD-10-CM

## 2023-12-03 MED ORDER — TRIAMCINOLONE ACETONIDE 0.1 % EX OINT: 1.0000 | TOPICAL_OINTMENT | Freq: Every day | CUTANEOUS | 2 refills | Status: DC | PRN

## 2023-12-03 NOTE — Therapy (Signed)
 OUTPATIENT PHYSICAL THERAPY NEURO TREATMENT   Patient Name: Sheryl Mcmillan MRN: [id number] DOB:[birth year], 48 y.o., female Today's Date: 12/03/2023   PCP: Lavona Pounds REFERRING PROVIDER: Augusto Blonder  END OF SESSION:  PT End of Session - 12/03/23 1138     Visit Number 7    Number of Visits 15    Date for PT Re-Evaluation 01/15/24    Authorization Type Medicaid    PT Start Time 1055    PT Stop Time 1136    PT Time Calculation (min) 41 min    Activity Tolerance Patient tolerated treatment well    Behavior During Therapy [facility] for tasks assessed/performed                 Past Medical History:  Diagnosis Date   Arthritis    Breast cancer (HCC)    Family history of non-Hodgkin's lymphoma 06/01/2020   GERD (gastroesophageal reflux disease)    Hemorrhoids    Migraines    PCOS (polycystic ovarian syndrome)    PONV (postoperative nausea and vomiting)    Seizures (HCC)    Past Surgical History:  Procedure Laterality Date   APPLICATION OF CRANIAL NAVIGATION Left 11/10/2021   Procedure: APPLICATION OF CRANIAL NAVIGATION;  Surgeon: Augusto Blonder, MD;  Location: [facility] OR;  Service: Neurosurgery;  Laterality: Left;   AXILLARY SENTINEL NODE BIOPSY Left 11/07/2020   Procedure: LEFT AXILLARY SENTINEL NODE BIOPSY;  Surgeon: Enid Harry, MD;  Location: [facility] OR;  Service: General;  Laterality: Left;   BREAST CYST EXCISION Right 09/22/2021   Procedure: Excision of right axillary excess soft tissue.;  Surgeon: Barb Bonito, MD;  Location: [facility];  Service: Plastics;  Laterality: Right;   BREAST RECONSTRUCTION WITH PLACEMENT OF TISSUE EXPANDER AND FLEX HD (ACELLULAR HYDRATED DERMIS) Bilateral 11/07/2020   Procedure: BILATERAL BREAST RECONSTRUCTION WITH PLACEMENT OF TISSUE EXPANDER AND FLEX HD (ACELLULAR HYDRATED DERMIS);  Surgeon: Barb Bonito, MD;  Location: [facility] OR;  Service: Plastics;  Laterality: Bilateral;   COSMETIC SURGERY     CRANIOTOMY Left  11/10/2021   Procedure: LT FRONTAL CRANIOTOMY TUMOR EXCISION;  Surgeon: Augusto Blonder, MD;  Location: [facility] OR;  Service: Neurosurgery;  Laterality: Left;   DILATION AND CURETTAGE OF UTERUS     HEMORRHOID SURGERY     IR IMAGING GUIDED PORT INSERTION  06/15/2020   LIPOSUCTION WITH LIPOFILLING Bilateral 09/22/2021   Procedure: Fat grafting bilateral breast;  Surgeon: Barb Bonito, MD;  Location: [facility];  Service: Plastics;  Laterality: Bilateral;   MASS EXCISION Bilateral 04/25/2021   Procedure: excision of bilateral axillary breast tissue;  Surgeon: Barb Bonito, MD;  Location: [facility];  Service: Plastics;  Laterality: Bilateral;   PORT-A-CATH REMOVAL Right 11/07/2020   Procedure: REMOVAL PORT-A-CATH;  Surgeon: Enid Harry, MD;  Location: [facility] OR;  Service: General;  Laterality: Right;   REMOVAL OF BILATERAL TISSUE EXPANDERS WITH PLACEMENT OF BILATERAL BREAST IMPLANTS Bilateral 04/25/2021   Procedure: REMOVAL OF BILATERAL TISSUE EXPANDERS WITH PLACEMENT OF BILATERAL BREAST IMPLANTS;  Surgeon: Barb Bonito, MD;  Location: [facility];  Service: Plastics;  Laterality: Bilateral;   ROBOTIC ASSISTED TOTAL HYSTERECTOMY WITH BILATERAL SALPINGO OOPHERECTOMY Bilateral 03/14/2021   Procedure: XI ROBOTIC ASSISTED TOTAL HYSTERECTOMY WITH BILATERAL SALPINGO OOPHORECTOMY;  Surgeon: Alphonso Aschoff, MD;  Location: [facility];  Service: Gynecology;  Laterality: Bilateral;   TOTAL MASTECTOMY Bilateral 11/07/2020   Procedure: BILATERAL MASTECTOMY;  Surgeon: Enid Harry, MD;  Location: [facility] OR;  Service:  General;  Laterality: Bilateral;   Patient Active Problem List   Diagnosis Date Noted   Leukocytosis 04/12/2023   Hyperlipidemia 04/11/2023   Seizure (HCC) 04/11/2023   Brain tumor (HCC) 11/08/2021   Metastatic cancer to brain (HCC) 11/08/2021   Cerebral edema (HCC) 11/08/2021   Complex partial seizure (HCC) 11/08/2021   Brain mass 11/07/2021   Right  sided weakness 11/07/2021   IDA (iron deficiency anemia) 07/18/2021   Cholelithiasis 03/14/2021   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   Obesity, Class III, BMI 40-49.9 (morbid obesity) 07/19/2020   BRCA1 gene mutation positive 07/04/2020   Genetic testing 06/28/2020   Family history of non-Hodgkin's lymphoma 06/01/2020   Malignant neoplasm of upper-outer quadrant of left breast in female, estrogen receptor positive (HCC) 05/26/2020   Acute right ankle pain 05/16/2016   Nondisplaced fracture of lateral malleolus of right fibula, initial encounter for closed fracture 05/16/2016    ONSET DATE: 09/24/23  REFERRING DIAG:  C79.31 (ICD-10-CM) - Secondary malignant neoplasm of brain    THERAPY DIAG:  Unsteadiness on feet  Muscle weakness (generalized)  Difficulty in walking, not elsewhere classified  Abnormal posture  Rationale for Evaluation and Treatment: Rehabilitation  SUBJECTIVE:                                                                                                                                                                                             SUBJECTIVE STATEMENT: Patient comes in with a significant red chest and some pain and itching, reports that she cannot get hot or sweat, she has been doing a chemo cream on her chest for skin cancer.  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 Last one was in October. I couldn't drive for 6 months. Have to go back next month for scans to check for brain activity and cancer. I feel good and better. The medicine is regulated now.  Pt accompanied by: self  PERTINENT HISTORY: hx of breast cancer and total masectomy, seizures, frontal craniotomy for tumor excision 11/10/21  PAIN:  Are you having pain? No  PRECAUTIONS: Fall  RED FLAGS: None   WEIGHT BEARING RESTRICTIONS: No  FALLS:  Has patient fallen in last 6 months? No  LIVING ENVIRONMENT: Lives with: lives with their family Lives in: House/apartment Stairs: Yes: External: 3 steps; none Has following equipment at home: Single point cane  PLOF: Independent  PATIENT GOALS: want to be more aligned, I can feel my gait is off. Want to be more  balanced and coordinated   OBJECTIVE:  Note: Objective measures were completed at Evaluation unless otherwise noted.  DIAGNOSTIC FINDINGS: 07/08/23 Brain: Encephalomalacia and a small amount of chronic blood products are again noted at the left frontoparietal resection site. A small amount of nonsuspicious enhancement at the resection site is unchanged. No new enhancing intracranial lesion is identified. The ventricles are normal in size. No acute infarct, midline shift, or extra-axial fluid collection is evident.  Unchanged left frontoparietal resection site. No evidence of new or progressive intracranial metastatic disease.    COGNITION: Overall cognitive status: Within functional limits for tasks assessed   SENSATION: WFL  COORDINATION: Some decreased motor control on RLE    MUSCLE LENGTH:  Some tightness in R hip, BL hamstrings, and in R ankle   POSTURE: rounded shoulders  LOWER EXTREMITY ROM:   all WFL, except R ankle DF still limited to 10d    LOWER EXTREMITY MMT:  grossly 5/5    FUNCTIONAL TESTS:   9 reps  30 seconds chair stand test Berg Balance Scale: 50/56  PATIENT SURVEYS:  ABC scale 65%                                                                                                                              TREATMENT DATE:  12/03/23 Supine LE stretches Red tband ankle exercises Black tband clamshells Ball b/n knees squeeze Feet on ball K2C, rotation, bridges, isometric abs STM to the upper traps and rhomboids  11/19/23 Treadmill 2%, 2 mph x23mins   Resisted gait 30# 4 way x4  Leg ext 15# 2x12 HS curls 35# 2x12 Small lunge on  BOSU Standing on BOSU balance then reaching for numbers  Alternating taps on 6" SLS on firm RLE- 7s at best    11/12/23 Gait outside 2 laps no rest good pace, some SOB Feet on ball K2C, rotation, bridges, isometric abs Passive piriformis stretch Black tband clamshells Bridges 3# on top of right foot DF, then some DF with eversion Standing calf raises Leg press 40# 2x10, then right only 3x 5  Walking ball toss  11/05/23 Nustep level 5 x 6 minutes On airex balance beam side stepping and tandem walking On airex volley ball On bosu reaching Direction changes On mat figure 8's On mat cone toe touches HS curls 35# 2x10, 20# right only 2x10 Leg extension 10# 2x10,  right 5# 2x10 Leg press 40# 2x10, then right only 20# x 10  10/29/23 NuStep L5x4mins Calf raises 2x12 Toe raises 2x12  Calf stretch 30s  Cone taps  Side steps on airex- CGA  Leg press 40# 2x10 Walking on beam   10/22/23 Walk outdoors 1 big lap  Rockerboard Step ups 6" Side steps on to airex ankleTB green 20 reps each way R  Resisted gait 30# 3way x4  establish HEP   10/09/23- EVAL    PATIENT EDUCATION: Education details: POC Person educated: Patient Education method: Explanation Education comprehension: verbalized  understanding  HOME EXERCISE PROGRAM: Walking, SLS, doing some exercises with bands  Access Code: [email or web address] URL: [email or web address] Date: 10/22/2023 Prepared by: Donavon Fudge  Exercises - Single Leg Stance  - 1 x daily - 7 x weekly - 2 sets - 10 hold - Tandem Stance  - 1 x daily - 7 x weekly - 2 sets - 10 reps - Sit to Stand  - 1 x daily - 7 x weekly - 2 sets - 10 reps - Heel Raises with Counter Support  - 1 x daily - 7 x weekly - 2 sets - 10 reps - Heel Toe Raises with Counter Support  - 1 x daily - 7 x weekly - 2 sets - 10 reps  GOALS: Goals reviewed with patient? Yes  SHORT TERM GOALS: Target date: 11/27/23  Patient will be independent with initial HEP. Baseline:   Goal status: met with walking program 11/05/23  2.  Patient will improve R ankle DF to 20d Baseline: 10d Goal status:  met 12/03/23    LONG TERM GOALS: Target date: 01/15/24  Patient will be independent with advanced/ongoing HEP to improve outcomes and carryover.  Baseline:  Goal status: INITIAL  2.  Patient will be able stand SLS on RLE >10s on firm and foam surfaces Baseline: 5s at best on firm  Goal status: IN PROGRESS 7s on firm 11/19/23  3.  Patient will be able to do at least 10 alternating taps on 6" in 20s  Baseline: 6 on 4" in 20s  Goal status: MET 11/19/23   4.  Patient will demonstrate 12 reps or more with 30s chair test Baseline: 9 Goal status:progressing 12/03/23  5.  Patient will improve ABC confidence score to 75-80%  Baseline: 65%  Goal status: INITIAL   ASSESSMENT:  CLINICAL IMPRESSION: Patient with some significant redness and irritation on her chest, reports that she has pain and irritation, due to a cream that she is putting on for skin cancer, she will see the MD today.  She was very tight and guarded with tenderness in the upper traps and the rhomboids.  We did some easy exercises and tried some STM to the upper traps   EVAL Patient is a 48 y.o. female who was seen today for physical therapy evaluation and treatment for aftercare following brain surgery. Her surgery was 2 years ago but she began having complication recently and seizures. She still has decreased coordination with RLE, and unable to do equal movements with both sides. R ankle DF has returned significantly but still missing about 10d. Pt would like to increase her mobility and ability to do things with her RLE to feel more balance and coordinated. She will benefit from PT to work on her overall strength, balance, and coordination to improve her confidence out in the community.   OBJECTIVE IMPAIRMENTS: Abnormal gait, decreased balance, decreased coordination, and decreased ROM.   ACTIVITY  LIMITATIONS: locomotion level  PARTICIPATION LIMITATIONS: community activity and yard work  PERSONAL FACTORS: Fitness, Past/current experiences, and Time since onset of injury/illness/exacerbation are also affecting patient's functional outcome.   REHAB POTENTIAL: Good  CLINICAL DECISION MAKING: Stable/uncomplicated  EVALUATION COMPLEXITY: Low  PLAN:  PT FREQUENCY: 1x/week  PT DURATION: 12 weeks  PLANNED INTERVENTIONS: 97110-Therapeutic exercises, 97530- Therapeutic activity, [id number]- Neuromuscular re-education, 97535- Self Care, [id number]- Manual therapy, [id number]- Gait training, [phone]- Electrical stimulation (manual), [phone]- Ultrasound, Patient/Family education, Balance training, Stair training, Taping, Dry Needling, Joint mobilization, Spinal mobilization, Cryotherapy, and Moist heat  PLAN FOR NEXT  SESSION: see how she is doing with the chest issues   Hollis Lurie, PT 12/03/2023, 11:40 AM

## 2023-12-03 NOTE — Progress Notes (Signed)
   Follow-Up Visit   Subjective  Sheryl Mcmillan is a 48 y.o. female who presents for the following: Patient here to discuss concern about reaction to Efudex . She has been using the medicine for 4 weeks consistently. She states that she feels like the area is burning and tender.   The patient has spots, moles and lesions to be evaluated, some may be new or changing.  The following portions of the chart were reviewed this encounter and updated as appropriate: medications, allergies, medical history  Review of Systems:  No other skin or systemic complaints except as noted in HPI or Assessment and Plan.  Objective  Well appearing patient in no apparent distress; mood and affect are within normal limits.   A focused examination was performed of the following areas: chest  Relevant exam findings are noted in the Assessment and Plan.  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 Patient has had a substantial reaction to the medication.  We recommended discontinuing the Efudex  and starting triamcinolone 2x per day to help relieve symptoms.  Patient should return in 4 weeks to follow up.  HISTORY OF SQUAMOUS CELL CARCINOMA IN SITU OF THE SKIN - No evidence of recurrence today - Recommend regular full body skin exams - Recommend daily broad spectrum sunscreen SPF 30+ to sun-exposed areas, reapply every 2 hours as needed.  - Call if any new or changing lesions are noted between office visits   Return in about 4 weeks (around 12/31/2023) for F/u after efudex  treatment.  I, Haig Levan, Surg Tech III, am acting as scribe for Deneise Finlay, MD.   Documentation: I have reviewed the above documentation for accuracy and completeness, and I agree with the above.  Deneise Finlay, MD

## 2023-12-03 NOTE — Patient Instructions (Signed)

## 2023-12-03 NOTE — Telephone Encounter (Signed)
 Patient called expressing concern out the efudex  reaction that she is having on her chest. She states that the reaction is spreading down between her breasts and going up to her shoulders. She was told that the cream only reacts with cancerous skin and is concerned that she has cancer all over her chest. I advised that it also reacts to precancer cells, that could be present by naked to the eye. She is being scheduled for evaluation at 2:45 today with Dr. Paci.

## 2023-12-09 NOTE — Therapy (Signed)
 OUTPATIENT PHYSICAL THERAPY NEURO TREATMENT   Patient Name: Sheryl Mcmillan MRN: [id number] DOB:09-14-1975, 48 y.o., female Today's Date: 12/10/2023   PCP: Lavona Pounds REFERRING PROVIDER: Augusto Blonder  END OF SESSION:  PT End of Session - 12/10/23 1102     Visit Number 8    Number of Visits 15    Date for PT Re-Evaluation 01/15/24    Authorization Type Medicaid    PT Start Time 1100    PT Stop Time 1145    PT Time Calculation (min) 45 min    Activity Tolerance Patient tolerated treatment well    Behavior During Therapy [facility] for tasks assessed/performed               Past Medical History:  Diagnosis Date   Arthritis    Breast cancer (HCC)    Family history of non-Hodgkin's lymphoma 06/01/2020   GERD (gastroesophageal reflux disease)    Hemorrhoids    Migraines    PCOS (polycystic ovarian syndrome)    PONV (postoperative nausea and vomiting)    Seizures (HCC)    Past Surgical History:  Procedure Laterality Date   APPLICATION OF CRANIAL NAVIGATION Left 11/10/2021   Procedure: APPLICATION OF CRANIAL NAVIGATION;  Surgeon: Augusto Blonder, MD;  Location: [facility] OR;  Service: Neurosurgery;  Laterality: Left;   AXILLARY SENTINEL NODE BIOPSY Left 11/07/2020   Procedure: LEFT AXILLARY SENTINEL NODE BIOPSY;  Surgeon: Enid Harry, MD;  Location: [facility] OR;  Service: General;  Laterality: Left;   BREAST CYST EXCISION Right 09/22/2021   Procedure: Excision of right axillary excess soft tissue.;  Surgeon: Barb Bonito, MD;  Location: [facility];  Service: Plastics;  Laterality: Right;   BREAST RECONSTRUCTION WITH PLACEMENT OF TISSUE EXPANDER AND FLEX HD (ACELLULAR HYDRATED DERMIS) Bilateral 11/07/2020   Procedure: BILATERAL BREAST RECONSTRUCTION WITH PLACEMENT OF TISSUE EXPANDER AND FLEX HD (ACELLULAR HYDRATED DERMIS);  Surgeon: Barb Bonito, MD;  Location: [facility] OR;  Service: Plastics;  Laterality: Bilateral;   COSMETIC SURGERY     CRANIOTOMY Left  11/10/2021   Procedure: LT FRONTAL CRANIOTOMY TUMOR EXCISION;  Surgeon: Augusto Blonder, MD;  Location: [facility] OR;  Service: Neurosurgery;  Laterality: Left;   DILATION AND CURETTAGE OF UTERUS     HEMORRHOID SURGERY     IR IMAGING GUIDED PORT INSERTION  06/15/2020   LIPOSUCTION WITH LIPOFILLING Bilateral 09/22/2021   Procedure: Fat grafting bilateral breast;  Surgeon: Barb Bonito, MD;  Location: [facility];  Service: Plastics;  Laterality: Bilateral;   MASS EXCISION Bilateral 04/25/2021   Procedure: excision of bilateral axillary breast tissue;  Surgeon: Barb Bonito, MD;  Location: [facility];  Service: Plastics;  Laterality: Bilateral;   PORT-A-CATH REMOVAL Right 11/07/2020   Procedure: REMOVAL PORT-A-CATH;  Surgeon: Enid Harry, MD;  Location: [facility] OR;  Service: General;  Laterality: Right;   REMOVAL OF BILATERAL TISSUE EXPANDERS WITH PLACEMENT OF BILATERAL BREAST IMPLANTS Bilateral 04/25/2021   Procedure: REMOVAL OF BILATERAL TISSUE EXPANDERS WITH PLACEMENT OF BILATERAL BREAST IMPLANTS;  Surgeon: Barb Bonito, MD;  Location:  SURGERY CENTER;  Service: Plastics;  Laterality: Bilateral;   ROBOTIC ASSISTED TOTAL HYSTERECTOMY WITH BILATERAL SALPINGO OOPHERECTOMY Bilateral 03/14/2021   Procedure: XI ROBOTIC ASSISTED TOTAL HYSTERECTOMY WITH BILATERAL SALPINGO OOPHORECTOMY;  Surgeon: Alphonso Aschoff, MD;  Location: [facility];  Service: Gynecology;  Laterality: Bilateral;   TOTAL MASTECTOMY Bilateral 11/07/2020   Procedure: BILATERAL MASTECTOMY;  Surgeon: Enid Harry, MD;  Location: [facility] OR;  Service: General;  Laterality: Bilateral;   Patient Active Problem List   Diagnosis Date Noted   Leukocytosis 04/12/2023   Hyperlipidemia 04/11/2023   Seizure (HCC) 04/11/2023   Brain tumor (HCC) 11/08/2021   Metastatic cancer to brain (HCC) 11/08/2021   Cerebral edema (HCC) 11/08/2021   Complex partial seizure (HCC) 11/08/2021   Brain mass 11/07/2021   Right  sided weakness 11/07/2021   IDA (iron deficiency anemia) 07/18/2021   Cholelithiasis 03/14/2021   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   Obesity, Class III, BMI 40-49.9 (morbid obesity) 07/19/2020   BRCA1 gene mutation positive 07/04/2020   Genetic testing 06/28/2020   Family history of non-Hodgkin's lymphoma 06/01/2020   Malignant neoplasm of upper-outer quadrant of left breast in female, estrogen receptor positive (HCC) 05/26/2020   Acute right ankle pain 05/16/2016   Nondisplaced fracture of lateral malleolus of right fibula, initial encounter for closed fracture 05/16/2016    ONSET DATE: 09/24/23  REFERRING DIAG:  C79.31 (ICD-10-CM) - Secondary malignant neoplasm of brain    THERAPY DIAG:  Unsteadiness on feet  Muscle weakness (generalized)  Difficulty in walking, not elsewhere classified  Abnormal posture  Aftercare following surgery for neoplasm  Rationale for Evaluation and Treatment: Rehabilitation  SUBJECTIVE:                                                                                                                                                                                             SUBJECTIVE STATEMENT: They took me off the chemo because it messed me up. Now I am on a steroid. I can't sweat or be out in the sun. The R leg has been giving me issues, the foot drop got me tripping at night and I have cramps in the ankle, the hip is also bothering me.    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 Last one was in October. I couldn't drive for 6 months. Have to go back next month for scans to check for brain activity and cancer. I feel good and better. The medicine is regulated now.  Pt accompanied by: self  PERTINENT HISTORY: hx of breast cancer and total masectomy, seizures, frontal craniotomy for tumor excision 11/10/21  PAIN:  Are you having pain? No  PRECAUTIONS: Fall  RED FLAGS: None   WEIGHT BEARING  RESTRICTIONS: No  FALLS: Has patient fallen in last 6 months? No  LIVING ENVIRONMENT: Lives with: lives with their family Lives in: House/apartment Stairs: Yes: External: 3 steps; none Has following equipment at home: Single point cane  PLOF: Independent  PATIENT GOALS: want to be more aligned, I can feel  my gait is off. Want to be more balanced and coordinated   OBJECTIVE:  Note: Objective measures were completed at Evaluation unless otherwise noted.  DIAGNOSTIC FINDINGS: 07/08/23 Brain: Encephalomalacia and a small amount of chronic blood products are again noted at the left frontoparietal resection site. A small amount of nonsuspicious enhancement at the resection site is unchanged. No new enhancing intracranial lesion is identified. The ventricles are normal in size. No acute infarct, midline shift, or extra-axial fluid collection is evident.  Unchanged left frontoparietal resection site. No evidence of new or progressive intracranial metastatic disease.    COGNITION: Overall cognitive status: Within functional limits for tasks assessed   SENSATION: WFL  COORDINATION: Some decreased motor control on RLE    MUSCLE LENGTH:  Some tightness in R hip, BL hamstrings, and in R ankle   POSTURE: rounded shoulders  LOWER EXTREMITY ROM:   all WFL, except R ankle DF still limited to 10d    LOWER EXTREMITY MMT:  grossly 5/5    FUNCTIONAL TESTS:   9 reps  30 seconds chair stand test Berg Balance Scale: 50/56  PATIENT SURVEYS:  ABC scale 65%                                                                                                                              TREATMENT DATE:  12/10/23 NuStep L5x43mins  2 way hip 10# 2x10 Calf stretch 15s x2 on slant  Box taps 6  HS and ITB stretch with strap  Hip flexor stretch Single knee to chest stretch  12/03/23 Supine LE stretches Red tband ankle exercises Black tband clamshells Ball b/n knees squeeze Feet on ball K2C,  rotation, bridges, isometric abs STM to the upper traps and rhomboids  11/19/23 Treadmill 2%, 2 mph x63mins   Resisted gait 30# 4 way x4  Leg ext 15# 2x12 HS curls 35# 2x12 Small lunge on BOSU Standing on BOSU balance then reaching for numbers  Alternating taps on 6 SLS on firm RLE- 7s at best    11/12/23 Gait outside 2 laps no rest good pace, some SOB Feet on ball K2C, rotation, bridges, isometric abs Passive piriformis stretch Black tband clamshells Bridges 3# on top of right foot DF, then some DF with eversion Standing calf raises Leg press 40# 2x10, then right only 3x 5  Walking ball toss  11/05/23 Nustep level 5 x 6 minutes On airex balance beam side stepping and tandem walking On airex volley ball On bosu reaching Direction changes On mat figure 8's On mat cone toe touches HS curls 35# 2x10, 20# right only 2x10 Leg extension 10# 2x10,  right 5# 2x10 Leg press 40# 2x10, then right only 20# x 10  10/29/23 NuStep L5x80mins Calf raises 2x12 Toe raises 2x12  Calf stretch 30s  Cone taps  Side steps on airex- CGA  Leg press 40# 2x10 Walking on beam   10/22/23 Walk outdoors 1 big lap  Rockerboard Step  ups 6 Side steps on to airex ankleTB green 20 reps each way R  Resisted gait 30# 3way x4  establish HEP   10/09/23- EVAL    PATIENT EDUCATION: Education details: POC Person educated: Patient Education method: Explanation Education comprehension: verbalized understanding  HOME EXERCISE PROGRAM: Walking, SLS, doing some exercises with bands  Access Code: [email or web address] URL: [email or web address] Date: 10/22/2023 Prepared by: Donavon Fudge  Exercises - Single Leg Stance  - 1 x daily - 7 x weekly - 2 sets - 10 hold - Tandem Stance  - 1 x daily - 7 x weekly - 2 sets - 10 reps - Sit to Stand  - 1 x daily - 7 x weekly - 2 sets - 10 reps - Heel Raises with Counter Support  - 1 x daily - 7 x weekly - 2 sets - 10 reps - Heel Toe Raises with Counter  Support  - 1 x daily - 7 x weekly - 2 sets - 10 reps  GOALS: Goals reviewed with patient? Yes  SHORT TERM GOALS: Target date: 11/27/23  Patient will be independent with initial HEP. Baseline:  Goal status: met with walking program 11/05/23  2.  Patient will improve R ankle DF to 20d Baseline: 10d Goal status:  met 12/03/23    LONG TERM GOALS: Target date: 01/15/24  Patient will be independent with advanced/ongoing HEP to improve outcomes and carryover.  Baseline:  Goal status: INITIAL  2.  Patient will be able stand SLS on RLE >10s on firm and foam surfaces Baseline: 5s at best on firm  Goal status: IN PROGRESS 7s on firm 11/19/23  3.  Patient will be able to do at least 10 alternating taps on 6 in 20s  Baseline: 6 on 4 in 20s  Goal status: MET 11/19/23   4.  Patient will demonstrate 12 reps or more with 30s chair test Baseline: 9 Goal status:progressing 12/03/23  5.  Patient will improve ABC confidence score to 75-80%  Baseline: 65%  Goal status: INITIAL   ASSESSMENT:  CLINICAL IMPRESSION: Patient has significant redness and irritation on her chest, due to a cream that she is putting on for skin cancer. They took her off the cream because it was eating her skin. Today she is very fatigued and tired. We did light exercises as she reports she should not be sweating. Her R hip is giving her some issues, with stretching she is very tight.    EVAL Patient is a 48 y.o. female who was seen today for physical therapy evaluation and treatment for aftercare following brain surgery. Her surgery was 2 years ago but she began having complication recently and seizures. She still has decreased coordination with RLE, and unable to do equal movements with both sides. R ankle DF has returned significantly but still missing about 10d. Pt would like to increase her mobility and ability to do things with her RLE to feel more balance and coordinated. She will benefit from PT to work on her  overall strength, balance, and coordination to improve her confidence out in the community.   OBJECTIVE IMPAIRMENTS: Abnormal gait, decreased balance, decreased coordination, and decreased ROM.   ACTIVITY LIMITATIONS: locomotion level  PARTICIPATION LIMITATIONS: community activity and yard work  PERSONAL FACTORS: Fitness, Past/current experiences, and Time since onset of injury/illness/exacerbation are also affecting patient's functional outcome.   REHAB POTENTIAL: Good  CLINICAL DECISION MAKING: Stable/uncomplicated  EVALUATION COMPLEXITY: Low  PLAN:  PT FREQUENCY: 1x/week  PT DURATION: 12 weeks  PLANNED INTERVENTIONS: 97110-Therapeutic exercises, 97530- Therapeutic activity, [id number]- Neuromuscular re-education, [phone]- Self Care, [id number]- Manual therapy, [phone]- Gait training, [phone]- Electrical stimulation (manual), [phone]- Ultrasound, Patient/Family education, Balance training, Stair training, Taping, Dry Needling, Joint mobilization, Spinal mobilization, Cryotherapy, and Moist heat  PLAN FOR NEXT SESSION: see how she is doing with the chest issues   Donavon Fudge, PT 12/10/2023, 11:40 AM

## 2023-12-10 ENCOUNTER — Ambulatory Visit

## 2023-12-10 DIAGNOSIS — R262 Difficulty in walking, not elsewhere classified: Secondary | ICD-10-CM

## 2023-12-10 DIAGNOSIS — R2681 Unsteadiness on feet: Secondary | ICD-10-CM | POA: Diagnosis not present

## 2023-12-10 DIAGNOSIS — M6281 Muscle weakness (generalized): Secondary | ICD-10-CM

## 2023-12-10 DIAGNOSIS — R293 Abnormal posture: Secondary | ICD-10-CM

## 2023-12-10 DIAGNOSIS — Z483 Aftercare following surgery for neoplasm: Secondary | ICD-10-CM

## 2023-12-17 ENCOUNTER — Encounter: Payer: Self-pay | Admitting: Physical Therapy

## 2023-12-17 ENCOUNTER — Ambulatory Visit: Admitting: Physical Therapy

## 2023-12-17 DIAGNOSIS — R2681 Unsteadiness on feet: Secondary | ICD-10-CM | POA: Diagnosis not present

## 2023-12-17 DIAGNOSIS — M6281 Muscle weakness (generalized): Secondary | ICD-10-CM

## 2023-12-17 DIAGNOSIS — R262 Difficulty in walking, not elsewhere classified: Secondary | ICD-10-CM

## 2023-12-17 DIAGNOSIS — R293 Abnormal posture: Secondary | ICD-10-CM

## 2023-12-17 NOTE — Therapy (Signed)
 OUTPATIENT PHYSICAL THERAPY NEURO TREATMENT   Patient Name: Sheryl Mcmillan MRN: [id number] DOB:20-Feb-1976, 48 y.o., female Today's Date: 12/17/2023   PCP: Greig Drones REFERRING PROVIDER: [name]  END OF SESSION:  PT End of Session - 12/17/23 1058     Visit Number 9    Number of Visits 15    Date for PT Re-Evaluation 01/15/24    Authorization Type Medicaid    PT Start Time 1059    PT Stop Time 1145    PT Time Calculation (min) 46 min    Activity Tolerance Patient tolerated treatment well    Behavior During Therapy [facility] for tasks assessed/performed               Past Medical History:  Diagnosis Date   Arthritis    Breast cancer (HCC)    Family history of non-Hodgkin's lymphoma 06/01/2020   GERD (gastroesophageal reflux disease)    Hemorrhoids    Migraines    PCOS (polycystic ovarian syndrome)    PONV (postoperative nausea and vomiting)    Seizures (HCC)    Past Surgical History:  Procedure Laterality Date   APPLICATION OF CRANIAL NAVIGATION Left 11/10/2021   Procedure: APPLICATION OF CRANIAL NAVIGATION;  Surgeon: [name], MD;  Location: [facility] OR;  Service: Neurosurgery;  Laterality: Left;   AXILLARY SENTINEL NODE BIOPSY Left 11/07/2020   Procedure: LEFT AXILLARY SENTINEL NODE BIOPSY;  Surgeon: [name], MD;  Location: [facility] OR;  Service: General;  Laterality: Left;   BREAST CYST EXCISION Right 09/22/2021   Procedure: Excision of right axillary excess soft tissue.;  Surgeon: [name], MD;  Location: [facility];  Service: Plastics;  Laterality: Right;   BREAST RECONSTRUCTION WITH PLACEMENT OF TISSUE EXPANDER AND FLEX HD (ACELLULAR HYDRATED DERMIS) Bilateral 11/07/2020   Procedure: BILATERAL BREAST RECONSTRUCTION WITH PLACEMENT OF TISSUE EXPANDER AND FLEX HD (ACELLULAR HYDRATED DERMIS);  Surgeon: [name], MD;  Location: [facility] OR;  Service: Plastics;  Laterality: Bilateral;   COSMETIC SURGERY     CRANIOTOMY Left  11/10/2021   Procedure: LT FRONTAL CRANIOTOMY TUMOR EXCISION;  Surgeon: [name], MD;  Location: [facility] OR;  Service: Neurosurgery;  Laterality: Left;   DILATION AND CURETTAGE OF UTERUS     HEMORRHOID SURGERY     IR IMAGING GUIDED PORT INSERTION  06/15/2020   LIPOSUCTION WITH LIPOFILLING Bilateral 09/22/2021   Procedure: Fat grafting bilateral breast;  Surgeon: [name], MD;  Location: [facility];  Service: Plastics;  Laterality: Bilateral;   MASS EXCISION Bilateral 04/25/2021   Procedure: excision of bilateral axillary breast tissue;  Surgeon: [name], MD;  Location:  SURGERY CENTER;  Service: Plastics;  Laterality: Bilateral;   PORT-A-CATH REMOVAL Right 11/07/2020   Procedure: REMOVAL PORT-A-CATH;  Surgeon: [name], MD;  Location: [facility] OR;  Service: General;  Laterality: Right;   REMOVAL OF BILATERAL TISSUE EXPANDERS WITH PLACEMENT OF BILATERAL BREAST IMPLANTS Bilateral 04/25/2021   Procedure: REMOVAL OF BILATERAL TISSUE EXPANDERS WITH PLACEMENT OF BILATERAL BREAST IMPLANTS;  Surgeon: [name], MD;  Location:  SURGERY CENTER;  Service: Plastics;  Laterality: Bilateral;   ROBOTIC ASSISTED TOTAL HYSTERECTOMY WITH BILATERAL SALPINGO OOPHERECTOMY Bilateral 03/14/2021   Procedure: XI ROBOTIC ASSISTED TOTAL HYSTERECTOMY WITH BILATERAL SALPINGO OOPHORECTOMY;  Surgeon: [name], MD;  Location: [facility];  Service: Gynecology;  Laterality: Bilateral;   TOTAL MASTECTOMY Bilateral 11/07/2020   Procedure: BILATERAL MASTECTOMY;  Surgeon: [name], MD;  Location: [facility] OR;  Service: General;  Laterality: Bilateral;   Patient Active Problem List   Diagnosis Date Noted   Leukocytosis 04/12/2023   Hyperlipidemia 04/11/2023   Seizure (HCC) 04/11/2023   Brain tumor (HCC) 11/08/2021   Metastatic cancer to brain (HCC) 11/08/2021   Cerebral edema (HCC) 11/08/2021   Complex partial seizure (HCC) 11/08/2021   Brain mass 11/07/2021   Right  sided weakness 11/07/2021   IDA (iron deficiency anemia) 07/18/2021   Cholelithiasis 03/14/2021   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   Obesity, Class III, BMI 40-49.9 (morbid obesity) 07/19/2020   BRCA1 gene mutation positive 07/04/2020   Genetic testing 06/28/2020   Family history of non-Hodgkin's lymphoma 06/01/2020   Malignant neoplasm of upper-outer quadrant of left breast in female, estrogen receptor positive (HCC) 05/26/2020   Acute right ankle pain 05/16/2016   Nondisplaced fracture of lateral malleolus of right fibula, initial encounter for closed fracture 05/16/2016    ONSET DATE: 09/24/23  REFERRING DIAG:  C79.31 (ICD-10-CM) - Secondary malignant neoplasm of brain    THERAPY DIAG:  Unsteadiness on feet  Muscle weakness (generalized)  Difficulty in walking, not elsewhere classified  Abnormal posture  Rationale for Evaluation and Treatment: Rehabilitation  SUBJECTIVE:                                                                                                                                                                                             SUBJECTIVE STATEMENT: Chest is doing better, I am feeling good.  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 Last one was in October. I couldn't drive for 6 months. Have to go back next month for scans to check for brain activity and cancer. I feel good and better. The medicine is regulated now.  Pt accompanied by: self  PERTINENT HISTORY: hx of breast cancer and total masectomy, seizures, frontal craniotomy for tumor excision 11/10/21  PAIN:  Are you having pain? No  PRECAUTIONS: Fall  RED FLAGS: None   WEIGHT BEARING RESTRICTIONS: No  FALLS: Has patient fallen in last 6 months? No  LIVING ENVIRONMENT: Lives with: lives with their family Lives in: House/apartment Stairs: Yes: External: 3 steps; none Has following equipment at home:  Single point cane  PLOF: Independent  PATIENT GOALS: want to be more aligned, I can feel my gait is off. Want to be more balanced and coordinated   OBJECTIVE:  Note: Objective measures were completed at Evaluation unless otherwise noted.  DIAGNOSTIC FINDINGS: 07/08/23 Brain: Encephalomalacia and a small amount of chronic blood products are again noted at the left frontoparietal resection  site. A small amount of nonsuspicious enhancement at the resection site is unchanged. No new enhancing intracranial lesion is identified. The ventricles are normal in size. No acute infarct, midline shift, or extra-axial fluid collection is evident.  Unchanged left frontoparietal resection site. No evidence of new or progressive intracranial metastatic disease.    COGNITION: Overall cognitive status: Within functional limits for tasks assessed   SENSATION: WFL  COORDINATION: Some decreased motor control on RLE    MUSCLE LENGTH:  Some tightness in R hip, BL hamstrings, and in R ankle   POSTURE: rounded shoulders  LOWER EXTREMITY ROM:   all WFL, except R ankle DF still limited to 10d    LOWER EXTREMITY MMT:  grossly 5/5    FUNCTIONAL TESTS:   9 reps  30 seconds chair stand test Berg Balance Scale: 50/56  PATIENT SURVEYS:  ABC scale 65%                                                                                                                              TREATMENT DATE:  12/17/23 Leg curls 35# 3x10 Leg extension 10# 3x10 On airex sit to stand with OHP weighted ball Elliptical with SBA Level 4 x 3 minutes On airex ball toss, cone toe touches On bosu ball toss and reaching 50# resisted side stepping Tmill push fwd and backward 25# rows 25# lats  12/10/23 NuStep L5x47mins  2 way hip 10# 2x10 Calf stretch 15s x2 on slant  Box taps 6  HS and ITB stretch with strap  Hip flexor stretch Single knee to chest stretch  12/03/23 Supine LE stretches Red tband ankle  exercises Black tband clamshells Ball b/n knees squeeze Feet on ball K2C, rotation, bridges, isometric abs STM to the upper traps and rhomboids  11/19/23 Treadmill 2%, 2 mph x73mins   Resisted gait 30# 4 way x4  Leg ext 15# 2x12 HS curls 35# 2x12 Small lunge on BOSU Standing on BOSU balance then reaching for numbers  Alternating taps on 6 SLS on firm RLE- 7s at best    11/12/23 Gait outside 2 laps no rest good pace, some SOB Feet on ball K2C, rotation, bridges, isometric abs Passive piriformis stretch Black tband clamshells Bridges 3# on top of right foot DF, then some DF with eversion Standing calf raises Leg press 40# 2x10, then right only 3x 5  Walking ball toss  11/05/23 Nustep level 5 x 6 minutes On airex balance beam side stepping and tandem walking On airex volley ball On bosu reaching Direction changes On mat figure 8's On mat cone toe touches HS curls 35# 2x10, 20# right only 2x10 Leg extension 10# 2x10,  right 5# 2x10 Leg press 40# 2x10, then right only 20# x 10  10/29/23 NuStep L5x69mins Calf raises 2x12 Toe raises 2x12  Calf stretch 30s  Cone taps  Side steps on airex- CGA  Leg press 40# 2x10 Walking on beam   10/22/23 Walk outdoors  1 big lap  Rockerboard Step ups 6 Side steps on to airex ankleTB green 20 reps each way R  Resisted gait 30# 3way x4  establish HEP   10/09/23- EVAL    PATIENT EDUCATION: Education details: POC Person educated: Patient Education method: Explanation Education comprehension: verbalized understanding  HOME EXERCISE PROGRAM: Walking, SLS, doing some exercises with bands  Access Code: [email or web address] URL: [email or web address] Date: 10/22/2023 Prepared by: Almetta Fam  Exercises - Single Leg Stance  - 1 x daily - 7 x weekly - 2 sets - 10 hold - Tandem Stance  - 1 x daily - 7 x weekly - 2 sets - 10 reps - Sit to Stand  - 1 x daily - 7 x weekly - 2 sets - 10 reps - Heel Raises with Counter Support  - 1  x daily - 7 x weekly - 2 sets - 10 reps - Heel Toe Raises with Counter Support  - 1 x daily - 7 x weekly - 2 sets - 10 reps  GOALS: Goals reviewed with patient? Yes  SHORT TERM GOALS: Target date: 11/27/23  Patient will be independent with initial HEP. Baseline:  Goal status: met with walking program 11/05/23  2.  Patient will improve R ankle DF to 20d Baseline: 10d Goal status:  met 12/03/23    LONG TERM GOALS: Target date: 01/15/24  Patient will be independent with advanced/ongoing HEP to improve outcomes and carryover.  Baseline:  Goal status: ongoing 12/17/23  2.  Patient will be able stand SLS on RLE >10s on firm and foam surfaces Baseline: 5s at best on firm  Goal status: IN PROGRESS 7s on firm 11/19/23  3.  Patient will be able to do at least 10 alternating taps on 6 in 20s  Baseline: 6 on 4 in 20s  Goal status: MET 11/19/23   4.  Patient will demonstrate 12 reps or more with 30s chair test Baseline: 9 Goal status:progressing 12/03/23  5.  Patient will improve ABC confidence score to 75-80%  Baseline: 65%  Goal status: ongoing 12/17/23   ASSESSMENT:  CLINICAL IMPRESSION: Patient is having less skin irritation.  She was agreeable to do more as last week she was told to not sweat, there are no open sores today, added elliptical and the t mill pushes.  She was SOB for some activities today and for some higher level balance required SBA to CGA.  She has to concentrate on the foot to not flop it down on the treadmill pushes  EVAL Patient is a 48 y.o. female who was seen today for physical therapy evaluation and treatment for aftercare following brain surgery. Her surgery was 2 years ago but she began having complication recently and seizures. She still has decreased coordination with RLE, and unable to do equal movements with both sides. R ankle DF has returned significantly but still missing about 10d. Pt would like to increase her mobility and ability to do things with her  RLE to feel more balance and coordinated. She will benefit from PT to work on her overall strength, balance, and coordination to improve her confidence out in the community.   OBJECTIVE IMPAIRMENTS: Abnormal gait, decreased balance, decreased coordination, and decreased ROM.   ACTIVITY LIMITATIONS: locomotion level  PARTICIPATION LIMITATIONS: community activity and yard work  PERSONAL FACTORS: Fitness, Past/current experiences, and Time since onset of injury/illness/exacerbation are also affecting patient's functional outcome.   REHAB POTENTIAL: Good  CLINICAL DECISION MAKING: Stable/uncomplicated  EVALUATION COMPLEXITY: Low  PLAN:  PT FREQUENCY: 1x/week  PT DURATION: 12 weeks  PLANNED INTERVENTIONS: 97110-Therapeutic exercises, 97530- Therapeutic activity, [id number]- Neuromuscular re-education, 97535- Self Care, [id number]- Manual therapy, [phone]- Gait training, [phone]- Electrical stimulation (manual), [phone]- Ultrasound, Patient/Family education, Balance training, Stair training, Taping, Dry Needling, Joint mobilization, Spinal mobilization, Cryotherapy, and Moist heat  PLAN FOR NEXT SESSION: push strength, endurance, balance and functional gait  Hiroyuki Ozanich W, PT 12/17/2023, 10:59 AM

## 2023-12-24 ENCOUNTER — Ambulatory Visit: Attending: Neurosurgery | Admitting: Physical Therapy

## 2023-12-24 ENCOUNTER — Encounter: Payer: Self-pay | Admitting: Physical Therapy

## 2023-12-24 DIAGNOSIS — Z483 Aftercare following surgery for neoplasm: Secondary | ICD-10-CM | POA: Diagnosis present

## 2023-12-24 DIAGNOSIS — R2689 Other abnormalities of gait and mobility: Secondary | ICD-10-CM | POA: Diagnosis present

## 2023-12-24 DIAGNOSIS — M6281 Muscle weakness (generalized): Secondary | ICD-10-CM | POA: Diagnosis present

## 2023-12-24 DIAGNOSIS — R278 Other lack of coordination: Secondary | ICD-10-CM | POA: Diagnosis present

## 2023-12-24 DIAGNOSIS — R262 Difficulty in walking, not elsewhere classified: Secondary | ICD-10-CM | POA: Insufficient documentation

## 2023-12-24 DIAGNOSIS — R2681 Unsteadiness on feet: Secondary | ICD-10-CM | POA: Insufficient documentation

## 2023-12-24 DIAGNOSIS — R293 Abnormal posture: Secondary | ICD-10-CM | POA: Insufficient documentation

## 2023-12-24 NOTE — Therapy (Signed)
 OUTPATIENT PHYSICAL THERAPY NEURO TREATMENT   Patient Name: Sheryl Mcmillan MRN: [id number] DOB:1975-10-12, 48 y.o., female Today's Date: 12/24/2023   PCP: Greig Drones REFERRING PROVIDER: Gerldine Mcmillan  END OF SESSION:  PT End of Session - 12/24/23 1445     Visit Number 10    Number of Visits 15    Date for PT Re-Evaluation 01/15/24    Authorization Type Medicaid    PT Start Time 1444    PT Stop Time 1530    PT Time Calculation (min) 46 min    Activity Tolerance Patient tolerated treatment well    Behavior During Therapy [facility] for tasks assessed/performed               Past Medical History:  Diagnosis Date   Arthritis    Breast cancer (HCC)    Family history of non-Hodgkin's lymphoma 06/01/2020   GERD (gastroesophageal reflux disease)    Hemorrhoids    Migraines    PCOS (polycystic ovarian syndrome)    PONV (postoperative nausea and vomiting)    Seizures (HCC)    Past Surgical History:  Procedure Laterality Date   APPLICATION OF CRANIAL NAVIGATION Left 11/10/2021   Procedure: APPLICATION OF CRANIAL NAVIGATION;  Surgeon: Mcmillan Gerldine, MD;  Location: [facility] OR;  Service: Neurosurgery;  Laterality: Left;   AXILLARY SENTINEL NODE BIOPSY Left 11/07/2020   Procedure: LEFT AXILLARY SENTINEL NODE BIOPSY;  Surgeon: Sheryl Cough, MD;  Location: [facility] OR;  Service: General;  Laterality: Left;   BREAST CYST EXCISION Right 09/22/2021   Procedure: Excision of right axillary excess soft tissue.;  Surgeon: Sheryl Craig RAMAN, MD;  Location: [facility];  Service: Plastics;  Laterality: Right;   BREAST RECONSTRUCTION WITH PLACEMENT OF TISSUE EXPANDER AND FLEX HD (ACELLULAR HYDRATED DERMIS) Bilateral 11/07/2020   Procedure: BILATERAL BREAST RECONSTRUCTION WITH PLACEMENT OF TISSUE EXPANDER AND FLEX HD (ACELLULAR HYDRATED DERMIS);  Surgeon: Sheryl Craig RAMAN, MD;  Location: [facility] OR;  Service: Plastics;  Laterality: Bilateral;   COSMETIC SURGERY     CRANIOTOMY Left  11/10/2021   Procedure: LT FRONTAL CRANIOTOMY TUMOR EXCISION;  Surgeon: Mcmillan Gerldine, MD;  Location: [facility] OR;  Service: Neurosurgery;  Laterality: Left;   DILATION AND CURETTAGE OF UTERUS     HEMORRHOID SURGERY     IR IMAGING GUIDED PORT INSERTION  06/15/2020   LIPOSUCTION WITH LIPOFILLING Bilateral 09/22/2021   Procedure: Fat grafting bilateral breast;  Surgeon: Sheryl Craig RAMAN, MD;  Location: [facility];  Service: Plastics;  Laterality: Bilateral;   MASS EXCISION Bilateral 04/25/2021   Procedure: excision of bilateral axillary breast tissue;  Surgeon: Sheryl Craig RAMAN, MD;  Location: [facility];  Service: Plastics;  Laterality: Bilateral;   PORT-A-CATH REMOVAL Right 11/07/2020   Procedure: REMOVAL PORT-A-CATH;  Surgeon: Sheryl Cough, MD;  Location: [facility] OR;  Service: General;  Laterality: Right;   REMOVAL OF BILATERAL TISSUE EXPANDERS WITH PLACEMENT OF BILATERAL BREAST IMPLANTS Bilateral 04/25/2021   Procedure: REMOVAL OF BILATERAL TISSUE EXPANDERS WITH PLACEMENT OF BILATERAL BREAST IMPLANTS;  Surgeon: Sheryl Craig RAMAN, MD;  Location: [facility];  Service: Plastics;  Laterality: Bilateral;   ROBOTIC ASSISTED TOTAL HYSTERECTOMY WITH BILATERAL SALPINGO OOPHERECTOMY Bilateral 03/14/2021   Procedure: XI ROBOTIC ASSISTED TOTAL HYSTERECTOMY WITH BILATERAL SALPINGO OOPHORECTOMY;  Surgeon: Sheryl Herring, MD;  Location: [facility];  Service: Gynecology;  Laterality: Bilateral;   TOTAL MASTECTOMY Bilateral 11/07/2020   Procedure: BILATERAL MASTECTOMY;  Surgeon: Sheryl Cough, MD;  Location: [facility] OR;  Service: General;  Laterality: Bilateral;   Patient Active Problem List   Diagnosis Date Noted   Leukocytosis 04/12/2023   Hyperlipidemia 04/11/2023   Seizure (HCC) 04/11/2023   Brain tumor (HCC) 11/08/2021   Metastatic cancer to brain (HCC) 11/08/2021   Cerebral edema (HCC) 11/08/2021   Complex partial seizure (HCC) 11/08/2021   Brain mass 11/07/2021   Right  sided weakness 11/07/2021   IDA (iron deficiency anemia) 07/18/2021   Cholelithiasis 03/14/2021   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   Obesity, Class III, BMI 40-49.9 (morbid obesity) 07/19/2020   BRCA1 gene mutation positive 07/04/2020   Genetic testing 06/28/2020   Family history of non-Hodgkin's lymphoma 06/01/2020   Malignant neoplasm of upper-outer quadrant of left breast in female, estrogen receptor positive (HCC) 05/26/2020   Acute right ankle pain 05/16/2016   Nondisplaced fracture of lateral malleolus of right fibula, initial encounter for closed fracture 05/16/2016    ONSET DATE: 09/24/23  REFERRING DIAG:  C79.31 (ICD-10-CM) - Secondary malignant neoplasm of brain    THERAPY DIAG:  Unsteadiness on feet  Muscle weakness (generalized)  Difficulty in walking, not elsewhere classified  Abnormal posture  Rationale for Evaluation and Treatment: Rehabilitation  SUBJECTIVE:                                                                                                                                                                                             SUBJECTIVE STATEMENT: Doing okay, no falls, no pain, just short of breath at times   EVAL: I started having seizures, 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 Last one was in October. I couldn't drive for 6 months. Have to go back next month for scans to check for brain activity and cancer. I feel good and better. The medicine is regulated now.  Pt accompanied by: self  PERTINENT HISTORY: hx of breast cancer and total masectomy, seizures, frontal craniotomy for tumor excision 11/10/21  PAIN:  Are you having pain? No  PRECAUTIONS: Fall  RED FLAGS: None   WEIGHT BEARING RESTRICTIONS: No  FALLS: Has patient fallen in last 6 months? No  LIVING ENVIRONMENT: Lives with: lives with their family Lives in: House/apartment Stairs: Yes: External: 3 steps; none Has following equipment at home: Single point  cane  PLOF: Independent  PATIENT GOALS: want to be more aligned, I can feel my gait is off. Want to be more balanced and coordinated   OBJECTIVE:  Note: Objective measures were completed at Evaluation unless otherwise noted.  DIAGNOSTIC FINDINGS: 07/08/23 Brain: Encephalomalacia and a small amount of chronic blood products are again noted at the left frontoparietal resection site. A small  amount of nonsuspicious enhancement at the resection site is unchanged. No new enhancing intracranial lesion is identified. The ventricles are normal in size. No acute infarct, midline shift, or extra-axial fluid collection is evident.  Unchanged left frontoparietal resection site. No evidence of new or progressive intracranial metastatic disease.    COGNITION: Overall cognitive status: Within functional limits for tasks assessed   SENSATION: WFL  COORDINATION: Some decreased motor control on RLE    MUSCLE LENGTH:  Some tightness in R hip, BL hamstrings, and in R ankle   POSTURE: rounded shoulders  LOWER EXTREMITY ROM:   all WFL, except R ankle DF still limited to 10d    LOWER EXTREMITY MMT:  grossly 5/5    FUNCTIONAL TESTS:   9 reps  30 seconds chair stand test Berg Balance Scale: 50/56  PATIENT SURVEYS:  ABC scale 65%                                                                                                                              TREATMENT DATE:  12/24/23 Elliptical level 4 x 4 minutes On airex cone toe touches On minitramp bounce, march, tried some hops with feet in and out Then using the pbars for feet in and out with small jump and feet staggered back and forth with small jump Tried some jumping forward and side ways Faster walk vs slow jog Tried skipping, could not do Walking over obstacles for longer step length Walking ball toss Standing ball kicks Standing cone toe touches with PT telling color and what foot, then same on airex  12/17/23 Leg curls 35#  3x10 Leg extension 10# 3x10 On airex sit to stand with OHP weighted ball Elliptical with SBA Level 4 x 3 minutes On airex ball toss, cone toe touches On bosu ball toss and reaching 50# resisted side stepping Tmill push fwd and backward 25# rows 25# lats  12/10/23 NuStep L5x23mins  2 way hip 10# 2x10 Calf stretch 15s x2 on slant  Box taps 6  HS and ITB stretch with strap  Hip flexor stretch Single knee to chest stretch  12/03/23 Supine LE stretches Red tband ankle exercises Black tband clamshells Ball b/n knees squeeze Feet on ball K2C, rotation, bridges, isometric abs STM to the upper traps and rhomboids  11/19/23 Treadmill 2%, 2 mph x52mins   Resisted gait 30# 4 way x4  Leg ext 15# 2x12 HS curls 35# 2x12 Small lunge on BOSU Standing on BOSU balance then reaching for numbers  Alternating taps on 6 SLS on firm RLE- 7s at best    11/12/23 Gait outside 2 laps no rest good pace, some SOB Feet on ball K2C, rotation, bridges, isometric abs Passive piriformis stretch Black tband clamshells Bridges 3# on top of right foot DF, then some DF with eversion Standing calf raises Leg press 40# 2x10, then right only 3x 5  Walking ball toss  11/05/23 Nustep level 5 x 6 minutes On  airex balance beam side stepping and tandem walking On airex volley ball On bosu reaching Direction changes On mat figure 8's On mat cone toe touches HS curls 35# 2x10, 20# right only 2x10 Leg extension 10# 2x10,  right 5# 2x10 Leg press 40# 2x10, then right only 20# x 10  10/29/23 NuStep L5x55mins Calf raises 2x12 Toe raises 2x12  Calf stretch 30s  Cone taps  Side steps on airex- CGA  Leg press 40# 2x10 Walking on beam   10/22/23 Walk outdoors 1 big lap  Rockerboard Step ups 6 Side steps on to airex ankleTB green 20 reps each way R  Resisted gait 30# 3way x4  establish HEP   10/09/23- EVAL    PATIENT EDUCATION: Education details: POC Person educated: Patient Education method:  Explanation Education comprehension: verbalized understanding  HOME EXERCISE PROGRAM: Walking, SLS, doing some exercises with bands  Access Code: [email or web address] URL: [email or web address] Date: 10/22/2023 Prepared by: Almetta Fam  Exercises - Single Leg Stance  - 1 x daily - 7 x weekly - 2 sets - 10 hold - Tandem Stance  - 1 x daily - 7 x weekly - 2 sets - 10 reps - Sit to Stand  - 1 x daily - 7 x weekly - 2 sets - 10 reps - Heel Raises with Counter Support  - 1 x daily - 7 x weekly - 2 sets - 10 reps - Heel Toe Raises with Counter Support  - 1 x daily - 7 x weekly - 2 sets - 10 reps  GOALS: Goals reviewed with patient? Yes  SHORT TERM GOALS: Target date: 11/27/23  Patient will be independent with initial HEP. Baseline:  Goal status: met with walking program 11/05/23  2.  Patient will improve R ankle DF to 20d Baseline: 10d Goal status:  met 12/03/23    LONG TERM GOALS: Target date: 01/15/24  Patient will be independent with advanced/ongoing HEP to improve outcomes and carryover.  Baseline:  Goal status: ongoing 12/17/23  2.  Patient will be able stand SLS on RLE >10s on firm and foam surfaces Baseline: 5s at best on firm  Goal status: IN PROGRESS 7s on firm 11/19/23  3.  Patient will be able to do at least 10 alternating taps on 6 in 20s  Baseline: 6 on 4 in 20s  Goal status: MET 11/19/23   4.  Patient will demonstrate 12 reps or more with 30s chair test Baseline: 9 Goal status:progressing 12/24/23  5.  Patient will improve ABC confidence score to 75-80%  Baseline: 65%  Goal status: ongoing 12/17/23   ASSESSMENT:  CLINICAL IMPRESSION: Patient reported that she wanted to try some light jog and jumping, she reports that she struggles when she goes faster with the right foot.  I did add a lot of higher level activities with jog and some jumping, this was difficult and she needed CGA and a lot of verbal cueing  EVAL Patient is a 48 y.o. female who was seen  today for physical therapy evaluation and treatment for aftercare following brain surgery. Her surgery was 2 years ago but she began having complication recently and seizures. She still has decreased coordination with RLE, and unable to do equal movements with both sides. R ankle DF has returned significantly but still missing about 10d. Pt would like to increase her mobility and ability to do things with her RLE to feel more balance and coordinated. She will benefit from PT to work on her overall strength, balance,  and coordination to improve her confidence out in the community.   OBJECTIVE IMPAIRMENTS: Abnormal gait, decreased balance, decreased coordination, and decreased ROM.   ACTIVITY LIMITATIONS: locomotion level  PARTICIPATION LIMITATIONS: community activity and yard work  PERSONAL FACTORS: Fitness, Past/current experiences, and Time since onset of injury/illness/exacerbation are also affecting patient's functional outcome.   REHAB POTENTIAL: Good  CLINICAL DECISION MAKING: Stable/uncomplicated  EVALUATION COMPLEXITY: Low  PLAN:  PT FREQUENCY: 1x/week  PT DURATION: 12 weeks  PLANNED INTERVENTIONS: [id number]-Therapeutic exercises, 97530- Therapeutic activity, [id number]- Neuromuscular re-education, 97535- Self Care, [id number]- Manual therapy, [phone]- Gait training, [phone]- Electrical stimulation (manual), [phone]- Ultrasound, Patient/Family education, Balance training, Stair training, Taping, Dry Needling, Joint mobilization, Spinal mobilization, Cryotherapy, and Moist heat  PLAN FOR NEXT SESSION: push strength, endurance, balance and functional gait, higher level activities  Arlind Klingerman W, PT 12/24/2023, 2:51 PM

## 2023-12-31 ENCOUNTER — Ambulatory Visit

## 2024-01-06 NOTE — Therapy (Signed)
 OUTPATIENT PHYSICAL THERAPY NEURO TREATMENT   Patient Name: Sheryl Mcmillan MRN: [id number] DOB:12/09/1975, 48 y.o., female Today's Date: 01/07/2024   PCP: Greig Drones REFERRING PROVIDER: [name]  END OF SESSION:  PT End of Session - 01/07/24 1102     Visit Number 11    Number of Visits 15    Date for PT Re-Evaluation 01/15/24    Authorization Type Medicaid    PT Start Time 1100    PT Stop Time 1145    PT Time Calculation (min) 45 min    Activity Tolerance Patient tolerated treatment well    Behavior During Therapy [facility] for tasks assessed/performed                Past Medical History:  Diagnosis Date   Arthritis    Breast cancer (HCC)    Family history of non-Hodgkin's lymphoma 06/01/2020   GERD (gastroesophageal reflux disease)    Hemorrhoids    Migraines    PCOS (polycystic ovarian syndrome)    PONV (postoperative nausea and vomiting)    Seizures (HCC)    Past Surgical History:  Procedure Laterality Date   APPLICATION OF CRANIAL NAVIGATION Left 11/10/2021   Procedure: APPLICATION OF CRANIAL NAVIGATION;  Surgeon: [name], MD;  Location: [facility] OR;  Service: Neurosurgery;  Laterality: Left;   AXILLARY SENTINEL NODE BIOPSY Left 11/07/2020   Procedure: LEFT AXILLARY SENTINEL NODE BIOPSY;  Surgeon: [name], MD;  Location[facility];  Service: General;  Laterality: Left;   BREAST CYST EXCISION Right 09/22/2021   Procedure: Excision of right axillary excess soft tissue.;  Surgeon: [name], MD;  Location: [facility];  Service: Plastics;  Laterality: Right;   BREAST RECONSTRUCTION WITH PLACEMENT OF TISSUE EXPANDER AND FLEX HD (ACELLULAR HYDRATED DERMIS) Bilateral 11/07/2020   Procedure: BILATERAL BREAST RECONSTRUCTION WITH PLACEMENT OF TISSUE EXPANDER AND FLEX HD (ACELLULAR HYDRATED DERMIS);  Surgeon: [name], MD;  Location: [facility] OR;  Service: Plastics;  Laterality: Bilateral;   COSMETIC SURGERY     CRANIOTOMY Left  11/10/2021   Procedure: LT FRONTAL CRANIOTOMY TUMOR EXCISION;  Surgeon: [name], MD;  Location: [facility] OR;  Service: Neurosurgery;  Laterality: Left;   DILATION AND CURETTAGE OF UTERUS     HEMORRHOID SURGERY     IR IMAGING GUIDED PORT INSERTION  06/15/2020   LIPOSUCTION WITH LIPOFILLING Bilateral 09/22/2021   Procedure: Fat grafting bilateral breast;  Surgeon: [name], MD;  Location: [facility];  Service: Plastics;  Laterality: Bilateral;   MASS EXCISION Bilateral 04/25/2021   Procedure: excision of bilateral axillary breast tissue;  Surgeon: [name], MD;  Location: [facility];  Service: Plastics;  Laterality: Bilateral;   PORT-A-CATH REMOVAL Right 11/07/2020   Procedure: REMOVAL PORT-A-CATH;  Surgeon: [name], MD;  Location: [facility] OR;  Service: General;  Laterality: Right;   REMOVAL OF BILATERAL TISSUE EXPANDERS WITH PLACEMENT OF BILATERAL BREAST IMPLANTS Bilateral 04/25/2021   Procedure: REMOVAL OF BILATERAL TISSUE EXPANDERS WITH PLACEMENT OF BILATERAL BREAST IMPLANTS;  Surgeon: [name], MD;  Location: [facility];  Service: Plastics;  Laterality: Bilateral;   ROBOTIC ASSISTED TOTAL HYSTERECTOMY WITH BILATERAL SALPINGO OOPHERECTOMY Bilateral 03/14/2021   Procedure: XI ROBOTIC ASSISTED TOTAL HYSTERECTOMY WITH BILATERAL SALPINGO OOPHORECTOMY;  Surgeon: [name], MD;  Location: [facility];  Service: Gynecology;  Laterality: Bilateral;   TOTAL MASTECTOMY Bilateral 11/07/2020   Procedure: BILATERAL MASTECTOMY;  Surgeon: [name], MD;  Location: [facility] OR;  Service: General;  Laterality: Bilateral;   Patient Active Problem List   Diagnosis Date Noted   Leukocytosis 04/12/2023   Hyperlipidemia 04/11/2023   Seizure (HCC) 04/11/2023   Brain tumor (HCC) 11/08/2021   Metastatic cancer to brain (HCC) 11/08/2021   Cerebral edema (HCC) 11/08/2021   Complex partial seizure (HCC) 11/08/2021   Brain mass 11/07/2021   Right  sided weakness 11/07/2021   IDA (iron deficiency anemia) 07/18/2021   Cholelithiasis 03/14/2021   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   Obesity, Class III, BMI 40-49.9 (morbid obesity) 07/19/2020   BRCA1 gene mutation positive 07/04/2020   Genetic testing 06/28/2020   Family history of non-Hodgkin's lymphoma 06/01/2020   Malignant neoplasm of upper-outer quadrant of left breast in female, estrogen receptor positive (HCC) 05/26/2020   Acute right ankle pain 05/16/2016   Nondisplaced fracture of lateral malleolus of right fibula, initial encounter for closed fracture 05/16/2016    ONSET DATE: 09/24/23  REFERRING DIAG:  C79.31 (ICD-10-CM) - Secondary malignant neoplasm of brain    THERAPY DIAG:  Unsteadiness on feet  Muscle weakness (generalized)  Aftercare following surgery for neoplasm  Other lack of coordination  Rationale for Evaluation and Treatment: Rehabilitation  SUBJECTIVE:                                                                                                                                                                                             SUBJECTIVE STATEMENT: Everything is good.    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 Last one was in October. I couldn't drive for 6 months. Have to go back next month for scans to check for brain activity and cancer. I feel good and better. The medicine is regulated now.  Pt accompanied by: self  PERTINENT HISTORY: hx of breast cancer and total masectomy, seizures, frontal craniotomy for tumor excision 11/10/21  PAIN:  Are you having pain? No  PRECAUTIONS: Fall  RED FLAGS: None   WEIGHT BEARING RESTRICTIONS: No  FALLS: Has patient fallen in last 6 months? No  LIVING ENVIRONMENT: Lives with: lives with their family Lives in: House/apartment Stairs: Yes: External: 3 steps; none Has following equipment at home: Single point cane  PLOF: Independent  PATIENT  GOALS: want to be more aligned, I can feel my gait is off. Want to be more balanced and coordinated   OBJECTIVE:  Note: Objective measures were completed at Evaluation unless otherwise noted.  DIAGNOSTIC FINDINGS: 07/08/23 Brain: Encephalomalacia and a small amount of chronic blood products are again noted at the left frontoparietal resection site. A small amount of nonsuspicious enhancement at the resection  site is unchanged. No new enhancing intracranial lesion is identified. The ventricles are normal in size. No acute infarct, midline shift, or extra-axial fluid collection is evident.  Unchanged left frontoparietal resection site. No evidence of new or progressive intracranial metastatic disease.    COGNITION: Overall cognitive status: Within functional limits for tasks assessed   SENSATION: WFL  COORDINATION: Some decreased motor control on RLE    MUSCLE LENGTH:  Some tightness in R hip, BL hamstrings, and in R ankle   POSTURE: rounded shoulders  LOWER EXTREMITY ROM:   all WFL, except R ankle DF still limited to 10d    LOWER EXTREMITY MMT:  grossly 5/5    FUNCTIONAL TESTS:   9 reps  30 seconds chair stand test Berg Balance Scale: 50/56  PATIENT SURVEYS:  ABC scale 65%                                                                                                                              TREATMENT DATE:  01/07/24 Elliptical L3 x20mins  On airex cone taps  Trampoline jumps in and out, scissors, tried SL but unable to push off with RLE Step up to SL x10 each side  Leg curls 35# 2x10, R only x10 Leg extension 10# 2x10, R only x10  12/24/23 Elliptical level 4 x 4 minutes On airex cone toe touches On minitramp bounce, march, tried some hops with feet in and out Then using the pbars for feet in and out with small jump and feet staggered back and forth with small jump Tried some jumping forward and side ways Faster walk vs slow jog Tried skipping, could not  do Walking over obstacles for longer step length Walking ball toss Standing ball kicks Standing cone toe touches with PT telling color and what foot, then same on airex  12/17/23 Leg curls 35# 3x10 Leg extension 10# 3x10 On airex sit to stand with OHP weighted ball Elliptical with SBA Level 4 x 3 minutes On airex ball toss, cone toe touches On bosu ball toss and reaching 50# resisted side stepping Tmill push fwd and backward 25# rows 25# lats  12/10/23 NuStep L5x59mins  2 way hip 10# 2x10 Calf stretch 15s x2 on slant  Box taps 6  HS and ITB stretch with strap  Hip flexor stretch Single knee to chest stretch  12/03/23 Supine LE stretches Red tband ankle exercises Black tband clamshells Ball b/n knees squeeze Feet on ball K2C, rotation, bridges, isometric abs STM to the upper traps and rhomboids  11/19/23 Treadmill 2%, 2 mph x24mins   Resisted gait 30# 4 way x4  Leg ext 15# 2x12 HS curls 35# 2x12 Small lunge on BOSU Standing on BOSU balance then reaching for numbers  Alternating taps on 6 SLS on firm RLE- 7s at best    11/12/23 Gait outside 2 laps no rest good pace, some SOB Feet on ball K2C, rotation, bridges, isometric abs Passive piriformis stretch  Black tband clamshells Bridges 3# on top of right foot DF, then some DF with eversion Standing calf raises Leg press 40# 2x10, then right only 3x 5  Walking ball toss  11/05/23 Nustep level 5 x 6 minutes On airex balance beam side stepping and tandem walking On airex volley ball On bosu reaching Direction changes On mat figure 8's On mat cone toe touches HS curls 35# 2x10, 20# right only 2x10 Leg extension 10# 2x10,  right 5# 2x10 Leg press 40# 2x10, then right only 20# x 10  10/29/23 NuStep L5x75mins Calf raises 2x12 Toe raises 2x12  Calf stretch 30s  Cone taps  Side steps on airex- CGA  Leg press 40# 2x10 Walking on beam   10/22/23 Walk outdoors 1 big lap  Rockerboard Step ups 6 Side steps on to  airex ankleTB green 20 reps each way R  Resisted gait 30# 3way x4  establish HEP   10/09/23- EVAL    PATIENT EDUCATION: Education details: POC Person educated: Patient Education method: Explanation Education comprehension: verbalized understanding  HOME EXERCISE PROGRAM: Walking, SLS, doing some exercises with bands  Access Code: [email or web address] URL: [email or web address] Date: 10/22/2023 Prepared by: Almetta Fam  Exercises - Single Leg Stance  - 1 x daily - 7 x weekly - 2 sets - 10 hold - Tandem Stance  - 1 x daily - 7 x weekly - 2 sets - 10 reps - Sit to Stand  - 1 x daily - 7 x weekly - 2 sets - 10 reps - Heel Raises with Counter Support  - 1 x daily - 7 x weekly - 2 sets - 10 reps - Heel Toe Raises with Counter Support  - 1 x daily - 7 x weekly - 2 sets - 10 reps  GOALS: Goals reviewed with patient? Yes  SHORT TERM GOALS: Target date: 11/27/23  Patient will be independent with initial HEP. Baseline:  Goal status: met with walking program 11/05/23  2.  Patient will improve R ankle DF to 20d Baseline: 10d Goal status:  met 12/03/23    LONG TERM GOALS: Target date: 01/15/24  Patient will be independent with advanced/ongoing HEP to improve outcomes and carryover.  Baseline:  Goal status: ongoing 12/17/23  2.  Patient will be able stand SLS on RLE >10s on firm and foam surfaces Baseline: 5s at best on firm  Goal status: IN PROGRESS 7s on firm 11/19/23  3.  Patient will be able to do at least 10 alternating taps on 6 in 20s  Baseline: 6 on 4 in 20s  Goal status: MET 11/19/23   4.  Patient will demonstrate 12 reps or more with 30s chair test Baseline: 9 Goal status:progressing 12/24/23  5.  Patient will improve ABC confidence score to 75-80%  Baseline: 65%  Goal status: ongoing 12/17/23   ASSESSMENT:  CLINICAL IMPRESSION: Continued with some higher level activities and jumping on trampoline. Some difficulty remains with coordination especially on the R  side. Unable to do SL jumping with the R side or step ups to SL with the R side without holding on to railing. Progress with balance and coordination as tolerated.   EVAL Patient is a 48 y.o. female who was seen today for physical therapy evaluation and treatment for aftercare following brain surgery. Her surgery was 2 years ago but she began having complication recently and seizures. She still has decreased coordination with RLE, and unable to do equal movements with both sides. R ankle DF has returned  significantly but still missing about 10d. Pt would like to increase her mobility and ability to do things with her RLE to feel more balance and coordinated. She will benefit from PT to work on her overall strength, balance, and coordination to improve her confidence out in the community.   OBJECTIVE IMPAIRMENTS: Abnormal gait, decreased balance, decreased coordination, and decreased ROM.   ACTIVITY LIMITATIONS: locomotion level  PARTICIPATION LIMITATIONS: community activity and yard work  PERSONAL FACTORS: Fitness, Past/current experiences, and Time since onset of injury/illness/exacerbation are also affecting patient's functional outcome.   REHAB POTENTIAL: Good  CLINICAL DECISION MAKING: Stable/uncomplicated  EVALUATION COMPLEXITY: Low  PLAN:  PT FREQUENCY: 1x/week  PT DURATION: 12 weeks  PLANNED INTERVENTIONS: [id number]-Therapeutic exercises, 97530- Therapeutic activity, [id number]- Neuromuscular re-education, 97535- Self Care, [id number]- Manual therapy, [phone]- Gait training, [phone]- Electrical stimulation (manual), [phone]- Ultrasound, Patient/Family education, Balance training, Stair training, Taping, Dry Needling, Joint mobilization, Spinal mobilization, Cryotherapy, and Moist heat  PLAN FOR NEXT SESSION: push strength, endurance, balance and functional gait, higher level activities  [employer], PT 01/07/2024, 11:45 AM

## 2024-01-07 ENCOUNTER — Ambulatory Visit

## 2024-01-07 DIAGNOSIS — Z483 Aftercare following surgery for neoplasm: Secondary | ICD-10-CM

## 2024-01-07 DIAGNOSIS — M6281 Muscle weakness (generalized): Secondary | ICD-10-CM

## 2024-01-07 DIAGNOSIS — R2681 Unsteadiness on feet: Secondary | ICD-10-CM

## 2024-01-07 DIAGNOSIS — R278 Other lack of coordination: Secondary | ICD-10-CM

## 2024-01-09 ENCOUNTER — Ambulatory Visit: Admitting: Dermatology

## 2024-01-14 ENCOUNTER — Encounter: Payer: Self-pay | Admitting: Physical Therapy

## 2024-01-14 ENCOUNTER — Ambulatory Visit: Admitting: Physical Therapy

## 2024-01-14 DIAGNOSIS — R2681 Unsteadiness on feet: Secondary | ICD-10-CM | POA: Diagnosis not present

## 2024-01-14 DIAGNOSIS — M6281 Muscle weakness (generalized): Secondary | ICD-10-CM

## 2024-01-14 DIAGNOSIS — R262 Difficulty in walking, not elsewhere classified: Secondary | ICD-10-CM

## 2024-01-14 NOTE — Therapy (Signed)
 OUTPATIENT PHYSICAL THERAPY NEURO TREATMENT   Patient Name: Sheryl Mcmillan MRN: [id number] DOB:22-Jul-1975, 48 y.o., female Today's Date: 01/14/2024   PCP: Greig Drones REFERRING PROVIDER: [name]  END OF SESSION:  PT End of Session - 01/14/24 1101     Visit Number 12    Number of Visits 15    Date for PT Re-Evaluation 01/15/24    Authorization Type Medicaid    PT Start Time 1100    PT Stop Time 1145    PT Time Calculation (min) 45 min    Activity Tolerance Patient tolerated treatment well    Behavior During Therapy [facility] for tasks assessed/performed                Past Medical History:  Diagnosis Date   Arthritis    Breast cancer (HCC)    Family history of non-Hodgkin's lymphoma 06/01/2020   GERD (gastroesophageal reflux disease)    Hemorrhoids    Migraines    PCOS (polycystic ovarian syndrome)    PONV (postoperative nausea and vomiting)    Seizures (HCC)    Past Surgical History:  Procedure Laterality Date   APPLICATION OF CRANIAL NAVIGATION Left 11/10/2021   Procedure: APPLICATION OF CRANIAL NAVIGATION;  Surgeon: [name], MD;  Location: [facility] OR;  Service: Neurosurgery;  Laterality: Left;   AXILLARY SENTINEL NODE BIOPSY Left 11/07/2020   Procedure: LEFT AXILLARY SENTINEL NODE BIOPSY;  Surgeon: [name], MD;  Location: [facility] OR;  Service: General;  Laterality: Left;   BREAST CYST EXCISION Right 09/22/2021   Procedure: Excision of right axillary excess soft tissue.;  Surgeon: [name], MD;  Location: [facility];  Service: Plastics;  Laterality: Right;   BREAST RECONSTRUCTION WITH PLACEMENT OF TISSUE EXPANDER AND FLEX HD (ACELLULAR HYDRATED DERMIS) Bilateral 11/07/2020   Procedure: BILATERAL BREAST RECONSTRUCTION WITH PLACEMENT OF TISSUE EXPANDER AND FLEX HD (ACELLULAR HYDRATED DERMIS);  Surgeon: [name], MD;  Location: [facility] OR;  Service: Plastics;  Laterality: Bilateral;   COSMETIC SURGERY     CRANIOTOMY Left  11/10/2021   Procedure: LT FRONTAL CRANIOTOMY TUMOR EXCISION;  Surgeon: [name], MD;  Location: [facility] OR;  Service: Neurosurgery;  Laterality: Left;   DILATION AND CURETTAGE OF UTERUS     HEMORRHOID SURGERY     IR IMAGING GUIDED PORT INSERTION  06/15/2020   LIPOSUCTION WITH LIPOFILLING Bilateral 09/22/2021   Procedure: Fat grafting bilateral breast;  Surgeon: [name], MD;  Location: [facility];  Service: Plastics;  Laterality: Bilateral;   MASS EXCISION Bilateral 04/25/2021   Procedure: excision of bilateral axillary breast tissue;  Surgeon: [name], MD;  Location: [facility];  Service: Plastics;  Laterality: Bilateral;   PORT-A-CATH REMOVAL Right 11/07/2020   Procedure: REMOVAL PORT-A-CATH;  Surgeon: [name], MD;  Location: [facility] OR;  Service: General;  Laterality: Right;   REMOVAL OF BILATERAL TISSUE EXPANDERS WITH PLACEMENT OF BILATERAL BREAST IMPLANTS Bilateral 04/25/2021   Procedure: REMOVAL OF BILATERAL TISSUE EXPANDERS WITH PLACEMENT OF BILATERAL BREAST IMPLANTS;  Surgeon: [name], MD;  Location: [facility];  Service: Plastics;  Laterality: Bilateral;   ROBOTIC ASSISTED TOTAL HYSTERECTOMY WITH BILATERAL SALPINGO OOPHERECTOMY Bilateral 03/14/2021   Procedure: XI ROBOTIC ASSISTED TOTAL HYSTERECTOMY WITH BILATERAL SALPINGO OOPHORECTOMY;  Surgeon: [name], MD;  Location: [facility];  Service: Gynecology;  Laterality: Bilateral;   TOTAL MASTECTOMY Bilateral 11/07/2020   Procedure: BILATERAL MASTECTOMY;  Surgeon: [name], MD;  Location: [facility] OR;  Service: General;  Laterality: Bilateral;   Patient Active Problem List   Diagnosis Date Noted   Leukocytosis 04/12/2023   Hyperlipidemia 04/11/2023   Seizure (HCC) 04/11/2023   Brain tumor (HCC) 11/08/2021   Metastatic cancer to brain (HCC) 11/08/2021   Cerebral edema (HCC) 11/08/2021   Complex partial seizure (HCC) 11/08/2021   Brain mass 11/07/2021   Right  sided weakness 11/07/2021   IDA (iron deficiency anemia) 07/18/2021   Cholelithiasis 03/14/2021   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   Obesity, Class III, BMI 40-49.9 (morbid obesity) 07/19/2020   BRCA1 gene mutation positive 07/04/2020   Genetic testing 06/28/2020   Family history of non-Hodgkin's lymphoma 06/01/2020   Malignant neoplasm of upper-outer quadrant of left breast in female, estrogen receptor positive (HCC) 05/26/2020   Acute right ankle pain 05/16/2016   Nondisplaced fracture of lateral malleolus of right fibula, initial encounter for closed fracture 05/16/2016    ONSET DATE: 09/24/23  REFERRING DIAG:  C79.31 (ICD-10-CM) - Secondary malignant neoplasm of brain    THERAPY DIAG:  Unsteadiness on feet  Muscle weakness (generalized)  Difficulty in walking, not elsewhere classified  Rationale for Evaluation and Treatment: Rehabilitation  SUBJECTIVE:                                                                                                                                                                                             SUBJECTIVE STATEMENT: Really tired  EVAL: I started having seizures, 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 Last one was in October. I couldn't drive for 6 months. Have to go back next month for scans to check for brain activity and cancer. I feel good and better. The medicine is regulated now.  Pt accompanied by: self  PERTINENT HISTORY: hx of breast cancer and total masectomy, seizures, frontal craniotomy for tumor excision 11/10/21  PAIN:  Are you having pain? No  PRECAUTIONS: Fall  RED FLAGS: None   WEIGHT BEARING RESTRICTIONS: No  FALLS: Has patient fallen in last 6 months? No  LIVING ENVIRONMENT: Lives with: lives with their family Lives in: House/apartment Stairs: Yes: External: 3 steps; none Has following equipment at home: Single point cane  PLOF: Independent  PATIENT GOALS: want to be more aligned,  I can feel my gait is off. Want to be more balanced and coordinated   OBJECTIVE:  Note: Objective measures were completed at Evaluation unless otherwise noted.  DIAGNOSTIC FINDINGS: 07/08/23 Brain: Encephalomalacia and a small amount of chronic blood products are again noted at the left frontoparietal resection site. A small amount of nonsuspicious enhancement at the resection site is unchanged. No new enhancing intracranial  lesion is identified. The ventricles are normal in size. No acute infarct, midline shift, or extra-axial fluid collection is evident.  Unchanged left frontoparietal resection site. No evidence of new or progressive intracranial metastatic disease.    COGNITION: Overall cognitive status: Within functional limits for tasks assessed   SENSATION: WFL  COORDINATION: Some decreased motor control on RLE    MUSCLE LENGTH:  Some tightness in R hip, BL hamstrings, and in R ankle   POSTURE: rounded shoulders  LOWER EXTREMITY ROM:   all WFL, except R ankle DF still limited to 10d    LOWER EXTREMITY MMT:  grossly 5/5    FUNCTIONAL TESTS:   9 reps  30 seconds chair stand test Berg Balance Scale: 50/56  PATIENT SURVEYS:  ABC scale 65%                                                                                                                              TREATMENT DATE:  01/14/24 Gait outside around the back building 20# seated row Lats 20# 10# straight arm pull 10# chest press  10# biceps  25# triceps 20# farmer carry 10# overhead single arm carry, then 10# with both arms overhead 5# UE circuit 5# sit to stand with overhead press  01/07/24 Elliptical L3 x53mins  On airex cone taps  Trampoline jumps in and out, scissors, tried SL but unable to push off with RLE Step up to SL x10 each side  Leg curls 35# 2x10, R only x10 Leg extension 10# 2x10, R only x10  12/24/23 Elliptical level 4 x 4 minutes On airex cone toe touches On minitramp bounce, march,  tried some hops with feet in and out Then using the pbars for feet in and out with small jump and feet staggered back and forth with small jump Tried some jumping forward and side ways Faster walk vs slow jog Tried skipping, could not do Walking over obstacles for longer step length Walking ball toss Standing ball kicks Standing cone toe touches with PT telling color and what foot, then same on airex  12/17/23 Leg curls 35# 3x10 Leg extension 10# 3x10 On airex sit to stand with OHP weighted ball Elliptical with SBA Level 4 x 3 minutes On airex ball toss, cone toe touches On bosu ball toss and reaching 50# resisted side stepping Tmill push fwd and backward 25# rows 25# lats  12/10/23 NuStep L5x66mins  2 way hip 10# 2x10 Calf stretch 15s x2 on slant  Box taps 6  HS and ITB stretch with strap  Hip flexor stretch Single knee to chest stretch  12/03/23 Supine LE stretches Red tband ankle exercises Black tband clamshells Ball b/n knees squeeze Feet on ball K2C, rotation, bridges, isometric abs STM to the upper traps and rhomboids  11/19/23 Treadmill 2%, 2 mph x29mins   Resisted gait 30# 4 way x4  Leg ext 15# 2x12 HS curls 35# 2x12 Small lunge on BOSU Standing on  BOSU balance then reaching for numbers  Alternating taps on 6 SLS on firm RLE- 7s at best    11/12/23 Gait outside 2 laps no rest good pace, some SOB Feet on ball K2C, rotation, bridges, isometric abs Passive piriformis stretch Black tband clamshells Bridges 3# on top of right foot DF, then some DF with eversion Standing calf raises Leg press 40# 2x10, then right only 3x 5  Walking ball toss  11/05/23 Nustep level 5 x 6 minutes On airex balance beam side stepping and tandem walking On airex volley ball On bosu reaching Direction changes On mat figure 8's On mat cone toe touches HS curls 35# 2x10, 20# right only 2x10 Leg extension 10# 2x10,  right 5# 2x10 Leg press 40# 2x10, then right only 20# x  10  10/29/23 NuStep L5x42mins Calf raises 2x12 Toe raises 2x12  Calf stretch 30s  Cone taps  Side steps on airex- CGA  Leg press 40# 2x10 Walking on beam   10/22/23 Walk outdoors 1 big lap  Rockerboard Step ups 6 Side steps on to airex ankleTB green 20 reps each way R  Resisted gait 30# 3way x4  establish HEP   10/09/23- EVAL    PATIENT EDUCATION: Education details: POC Person educated: Patient Education method: Explanation Education comprehension: verbalized understanding  HOME EXERCISE PROGRAM: Walking, SLS, doing some exercises with bands  Access Code: [email or web address] URL: [email or web address] Date: 10/22/2023 Prepared by: Almetta Fam  Exercises - Single Leg Stance  - 1 x daily - 7 x weekly - 2 sets - 10 hold - Tandem Stance  - 1 x daily - 7 x weekly - 2 sets - 10 reps - Sit to Stand  - 1 x daily - 7 x weekly - 2 sets - 10 reps - Heel Raises with Counter Support  - 1 x daily - 7 x weekly - 2 sets - 10 reps - Heel Toe Raises with Counter Support  - 1 x daily - 7 x weekly - 2 sets - 10 reps  GOALS: Goals reviewed with patient? Yes  SHORT TERM GOALS: Target date: 11/27/23  Patient will be independent with initial HEP. Baseline:  Goal status: met with walking program 11/05/23  2.  Patient will improve R ankle DF to 20d Baseline: 10d Goal status:  met 12/03/23    LONG TERM GOALS: Target date: 01/15/24  Patient will be independent with advanced/ongoing HEP to improve outcomes and carryover.  Baseline:  Goal status: ongoing 12/17/23  2.  Patient will be able stand SLS on RLE >10s on firm and foam surfaces Baseline: 5s at best on firm  Goal status: IN PROGRESS 7s on firm 11/19/23  3.  Patient will be able to do at least 10 alternating taps on 6 in 20s  Baseline: 6 on 4 in 20s  Goal status: MET 11/19/23   4.  Patient will demonstrate 12 reps or more with 30s chair test Baseline: 9 Goal status:progressing 12/24/23  5.  Patient will improve ABC  confidence score to 75-80%  Baseline: 65%  Goal status: met 01/14/24   ASSESSMENT:  CLINICAL IMPRESSION: This treatment we focused on some UE strength and function for better ADL's.  She tolerated well but had some difficulty with the left shoulder due to fatigue and poor coordination. EVAL Patient is a 48 y.o. female who was seen today for physical therapy evaluation and treatment for aftercare following brain surgery. Her surgery was 2 years ago but she began having complication recently and  seizures. She still has decreased coordination with RLE, and unable to do equal movements with both sides. R ankle DF has returned significantly but still missing about 10d. Pt would like to increase her mobility and ability to do things with her RLE to feel more balance and coordinated. She will benefit from PT to work on her overall strength, balance, and coordination to improve her confidence out in the community.   OBJECTIVE IMPAIRMENTS: Abnormal gait, decreased balance, decreased coordination, and decreased ROM.   ACTIVITY LIMITATIONS: locomotion level  PARTICIPATION LIMITATIONS: community activity and yard work  PERSONAL FACTORS: Fitness, Past/current experiences, and Time since onset of injury/illness/exacerbation are also affecting patient's functional outcome.   REHAB POTENTIAL: Good  CLINICAL DECISION MAKING: Stable/uncomplicated  EVALUATION COMPLEXITY: Low  PLAN:  PT FREQUENCY: 1x/week  PT DURATION: 12 weeks  PLANNED INTERVENTIONS: 97110-Therapeutic exercises, 97530- Therapeutic activity, [id number]- Neuromuscular re-education, 97535- Self Care, [id number]- Manual therapy, [phone]- Gait training, [phone]- Electrical stimulation (manual), [phone]- Ultrasound, Patient/Family education, Balance training, Stair training, Taping, Dry Needling, Joint mobilization, Spinal mobilization, Cryotherapy, and Moist heat  PLAN FOR NEXT SESSION: push strength, endurance, balance and functional gait, higher level  activities  Jerre Diguglielmo W, PT 01/14/2024, 11:02 AM

## 2024-01-21 ENCOUNTER — Ambulatory Visit: Admitting: Physical Therapy

## 2024-01-21 ENCOUNTER — Encounter: Payer: Self-pay | Admitting: Physical Therapy

## 2024-01-21 DIAGNOSIS — R278 Other lack of coordination: Secondary | ICD-10-CM

## 2024-01-21 DIAGNOSIS — R2681 Unsteadiness on feet: Secondary | ICD-10-CM | POA: Diagnosis not present

## 2024-01-21 DIAGNOSIS — Z483 Aftercare following surgery for neoplasm: Secondary | ICD-10-CM

## 2024-01-21 DIAGNOSIS — M6281 Muscle weakness (generalized): Secondary | ICD-10-CM

## 2024-01-21 DIAGNOSIS — R262 Difficulty in walking, not elsewhere classified: Secondary | ICD-10-CM

## 2024-01-21 DIAGNOSIS — R2689 Other abnormalities of gait and mobility: Secondary | ICD-10-CM

## 2024-01-21 NOTE — Therapy (Signed)
 OUTPATIENT PHYSICAL THERAPY NEURO TREATMENT   Patient Name: Sheryl Mcmillan MRN: [id number] DOB:[birth year], 48 y.o., female Today's Date: 01/21/2024   PCP: Greig Drones REFERRING PROVIDER: [name]  END OF SESSION:  PT End of Session - 01/21/24 1101     Visit Number 13    Number of Visits 27    Date for PT Re-Evaluation 03/23/24    Authorization Type Medicaid    PT Start Time 1100    PT Stop Time 1145    PT Time Calculation (min) 45 min    Activity Tolerance Patient tolerated treatment well    Behavior During Therapy [facility] for tasks assessed/performed                Past Medical History:  Diagnosis Date   Arthritis    Breast cancer (HCC)    Family history of non-Hodgkin's lymphoma 06/01/2020   GERD (gastroesophageal reflux disease)    Hemorrhoids    Migraines    PCOS (polycystic ovarian syndrome)    PONV (postoperative nausea and vomiting)    Seizures (HCC)    Past Surgical History:  Procedure Laterality Date   APPLICATION OF CRANIAL NAVIGATION Left 11/10/2021   Procedure: APPLICATION OF CRANIAL NAVIGATION;  Surgeon: [name], MD;  Location: [facility] OR;  Service: Neurosurgery;  Laterality: Left;   AXILLARY SENTINEL NODE BIOPSY Left 11/07/2020   Procedure: LEFT AXILLARY SENTINEL NODE BIOPSY;  Surgeon: [name], MD;  Location: [facility] OR;  Service: General;  Laterality: Left;   BREAST CYST EXCISION Right 09/22/2021   Procedure: Excision of right axillary excess soft tissue.;  Surgeon: [name], MD;  Location: [facility];  Service: Plastics;  Laterality: Right;   BREAST RECONSTRUCTION WITH PLACEMENT OF TISSUE EXPANDER AND FLEX HD (ACELLULAR HYDRATED DERMIS) Bilateral 11/07/2020   Procedure: BILATERAL BREAST RECONSTRUCTION WITH PLACEMENT OF TISSUE EXPANDER AND FLEX HD (ACELLULAR HYDRATED DERMIS);  Surgeon: [name], MD;  Location: [facility] OR;  Service: Plastics;  Laterality: Bilateral;   COSMETIC SURGERY     CRANIOTOMY Left  11/10/2021   Procedure: LT FRONTAL CRANIOTOMY TUMOR EXCISION;  Surgeon: [name], MD;  Location: [facility] OR;  Service: Neurosurgery;  Laterality: Left;   DILATION AND CURETTAGE OF UTERUS     HEMORRHOID SURGERY     IR IMAGING GUIDED PORT INSERTION  06/15/2020   LIPOSUCTION WITH LIPOFILLING Bilateral 09/22/2021   Procedure: Fat grafting bilateral breast;  Surgeon: [name], MD;  Location: [facility];  Service: Plastics;  Laterality: Bilateral;   MASS EXCISION Bilateral 04/25/2021   Procedure: excision of bilateral axillary breast tissue;  Surgeon: [name], MD;  Location: [facility];  Service: Plastics;  Laterality: Bilateral;   PORT-A-CATH REMOVAL Right 11/07/2020   Procedure: REMOVAL PORT-A-CATH;  Surgeon: [name], MD;  Location: [facility] OR;  Service: General;  Laterality: Right;   REMOVAL OF BILATERAL TISSUE EXPANDERS WITH PLACEMENT OF BILATERAL BREAST IMPLANTS Bilateral 04/25/2021   Procedure: REMOVAL OF BILATERAL TISSUE EXPANDERS WITH PLACEMENT OF BILATERAL BREAST IMPLANTS;  Surgeon: [name], MD;  Location:  SURGERY CENTER;  Service: Plastics;  Laterality: Bilateral;   ROBOTIC ASSISTED TOTAL HYSTERECTOMY WITH BILATERAL SALPINGO OOPHERECTOMY Bilateral 03/14/2021   Procedure: XI ROBOTIC ASSISTED TOTAL HYSTERECTOMY WITH BILATERAL SALPINGO OOPHORECTOMY;  Surgeon: [name], MD;  Location: [facility];  Service: Gynecology;  Laterality: Bilateral;   TOTAL MASTECTOMY Bilateral 11/07/2020   Procedure: BILATERAL MASTECTOMY;  Surgeon: [name], MD;  Location: [facility] OR;  Service: General;  Laterality: Bilateral;   Patient Active Problem List   Diagnosis Date Noted   Leukocytosis 04/12/2023   Hyperlipidemia 04/11/2023   Seizure (HCC) 04/11/2023   Brain tumor (HCC) 11/08/2021   Metastatic cancer to brain (HCC) 11/08/2021   Cerebral edema (HCC) 11/08/2021   Complex partial seizure (HCC) 11/08/2021   Brain mass 11/07/2021   Right  sided weakness 11/07/2021   IDA (iron deficiency anemia) 07/18/2021   Cholelithiasis 03/14/2021   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   Obesity, Class III, BMI 40-49.9 (morbid obesity) 07/19/2020   BRCA1 gene mutation positive 07/04/2020   Genetic testing 06/28/2020   Family history of non-Hodgkin's lymphoma 06/01/2020   Malignant neoplasm of upper-outer quadrant of left breast in female, estrogen receptor positive (HCC) 05/26/2020   Acute right ankle pain 05/16/2016   Nondisplaced fracture of lateral malleolus of right fibula, initial encounter for closed fracture 05/16/2016    ONSET DATE: 09/24/23  REFERRING DIAG:  C79.31 (ICD-10-CM) - Secondary malignant neoplasm of brain    THERAPY DIAG:  Unsteadiness on feet  Muscle weakness (generalized)  Difficulty in walking, not elsewhere classified  Aftercare following surgery for neoplasm  Other lack of coordination  Other abnormalities of gait and mobility  Rationale for Evaluation and Treatment: Rehabilitation  SUBJECTIVE:                                                                                                                                                                                             SUBJECTIVE STATEMENT: Really tired  EVAL: I started having seizures, 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 Last one was in October. I couldn't drive for 6 months. Have to go back next month for scans to check for brain activity and cancer. I feel good and better. The medicine is regulated now.  Pt accompanied by: self  PERTINENT HISTORY: hx of breast cancer and total masectomy, seizures, frontal craniotomy for tumor excision 11/10/21  PAIN:  Are you having pain? No  PRECAUTIONS: Fall  RED FLAGS: None   WEIGHT BEARING RESTRICTIONS: No  FALLS: Has patient fallen in last 6 months? No  LIVING ENVIRONMENT: Lives with: lives with their family Lives in: House/apartment Stairs: Yes: External: 3 steps;  none Has following equipment at home: Single point cane  PLOF: Independent  PATIENT GOALS: want to be more aligned, I can feel my gait is off. Want to be more balanced and coordinated   OBJECTIVE:  Note: Objective measures were completed at Evaluation unless otherwise noted.  DIAGNOSTIC FINDINGS: 07/08/23 Brain: Encephalomalacia and a small amount of chronic blood products are again noted at the left frontoparietal  resection site. A small amount of nonsuspicious enhancement at the resection site is unchanged. No new enhancing intracranial lesion is identified. The ventricles are normal in size. No acute infarct, midline shift, or extra-axial fluid collection is evident.  Unchanged left frontoparietal resection site. No evidence of new or progressive intracranial metastatic disease.    COGNITION: Overall cognitive status: Within functional limits for tasks assessed   SENSATION: WFL  COORDINATION: Some decreased motor control on RLE    MUSCLE LENGTH:  Some tightness in R hip, BL hamstrings, and in R ankle   POSTURE: rounded shoulders  LOWER EXTREMITY ROM:   all WFL, except R ankle DF still limited to 10d    LOWER EXTREMITY MMT:  grossly 5/5    FUNCTIONAL TESTS:   9 reps  30 seconds chair stand test Berg Balance Scale: 50/56  PATIENT SURVEYS:  ABC scale 65%                                                                                                                              TREATMENT DATE:  01/21/24 30 secondchair stand test 12, improved from 9 but still below her norm for age Elliptical level 4 x 4 minutes Walking ball toss Ball kicks On bosu reaching and reacting Rows 35# Lats 35# Chest press 15# 35# triceps 15# biceps 9# single arm overhead carry 20 # overhead carry both arms  01/14/24 Gait outside around the back building 20# seated row Lats 20# 10# straight arm pull 10# chest press  10# biceps  25# triceps 20# farmer carry 10# overhead single  arm carry, then 10# with both arms overhead 5# UE circuit 5# sit to stand with overhead press  01/07/24 Elliptical L3 x74mins  On airex cone taps  Trampoline jumps in and out, scissors, tried SL but unable to push off with RLE Step up to SL x10 each side  Leg curls 35# 2x10, R only x10 Leg extension 10# 2x10, R only x10  12/24/23 Elliptical level 4 x 4 minutes On airex cone toe touches On minitramp bounce, march, tried some hops with feet in and out Then using the pbars for feet in and out with small jump and feet staggered back and forth with small jump Tried some jumping forward and side ways Faster walk vs slow jog Tried skipping, could not do Walking over obstacles for longer step length Walking ball toss Standing ball kicks Standing cone toe touches with PT telling color and what foot, then same on airex  12/17/23 Leg curls 35# 3x10 Leg extension 10# 3x10 On airex sit to stand with OHP weighted ball Elliptical with SBA Level 4 x 3 minutes On airex ball toss, cone toe touches On bosu ball toss and reaching 50# resisted side stepping Tmill push fwd and backward 25# rows 25# lats  12/10/23 NuStep L5x77mins  2 way hip 10# 2x10 Calf stretch 15s x2 on slant  Box taps 6  HS and ITB  stretch with strap  Hip flexor stretch Single knee to chest stretch  12/03/23 Supine LE stretches Red tband ankle exercises Black tband clamshells Ball b/n knees squeeze Feet on ball K2C, rotation, bridges, isometric abs STM to the upper traps and rhomboids  11/19/23 Treadmill 2%, 2 mph x10mins   Resisted gait 30# 4 way x4  Leg ext 15# 2x12 HS curls 35# 2x12 Small lunge on BOSU Standing on BOSU balance then reaching for numbers  Alternating taps on 6 SLS on firm RLE- 7s at best    11/12/23 Gait outside 2 laps no rest good pace, some SOB Feet on ball K2C, rotation, bridges, isometric abs Passive piriformis stretch Black tband clamshells Bridges 3# on top of right foot DF, then  some DF with eversion Standing calf raises Leg press 40# 2x10, then right only 3x 5  Walking ball toss  11/05/23 Nustep level 5 x 6 minutes On airex balance beam side stepping and tandem walking On airex volley ball On bosu reaching Direction changes On mat figure 8's On mat cone toe touches HS curls 35# 2x10, 20# right only 2x10 Leg extension 10# 2x10,  right 5# 2x10 Leg press 40# 2x10, then right only 20# x 10  10/29/23 NuStep L5x47mins Calf raises 2x12 Toe raises 2x12  Calf stretch 30s  Cone taps  Side steps on airex- CGA  Leg press 40# 2x10 Walking on beam   10/22/23 Walk outdoors 1 big lap  Rockerboard Step ups 6 Side steps on to airex ankleTB green 20 reps each way R  Resisted gait 30# 3way x4  establish HEP   10/09/23- EVAL    PATIENT EDUCATION: Education details: POC Person educated: Patient Education method: Explanation Education comprehension: verbalized understanding  HOME EXERCISE PROGRAM: Walking, SLS, doing some exercises with bands  Access Code: [email or web address] URL: [email or web address] Date: 10/22/2023 Prepared by: Almetta Fam  Exercises - Single Leg Stance  - 1 x daily - 7 x weekly - 2 sets - 10 hold - Tandem Stance  - 1 x daily - 7 x weekly - 2 sets - 10 reps - Sit to Stand  - 1 x daily - 7 x weekly - 2 sets - 10 reps - Heel Raises with Counter Support  - 1 x daily - 7 x weekly - 2 sets - 10 reps - Heel Toe Raises with Counter Support  - 1 x daily - 7 x weekly - 2 sets - 10 reps  GOALS: Goals reviewed with patient? Yes  SHORT TERM GOALS: Target date: 11/27/23  Patient will be independent with initial HEP. Baseline:  Goal status: met with walking program 11/05/23  2.  Patient will improve R ankle DF to 20d Baseline: 10d Goal status:  met 12/03/23    LONG TERM GOALS: Target date: 01/15/24  Patient will be independent with advanced/ongoing HEP to improve outcomes and carryover.  Baseline:  Goal status: progressing  01/21/24  2.  Patient will be able stand SLS on RLE >10s on firm and foam surfaces Baseline: 5s at best on firm  Goal status: progressing 01/21/24  3.  Patient will be able to do at least 10 alternating taps on 6 in 20s  Baseline: 6 on 4 in 20s  Goal status: MET 11/19/23   4.  Patient will demonstrate 12 reps or more with 30s chair test Baseline: 9 Goal status: at 12 reps on 01/21/24  5.  Patient will improve ABC confidence score to 75-80%  Baseline: 65%  Goal status: met  01/14/24   ASSESSMENT:  CLINICAL IMPRESSION: Overall patient is improving with her balance, strength, function and overall quality of life.  She improved in her 30 second chair stand test by 3 reps, still below her norms for age.  For all the surgeries and trials she has been through she has a wonderful outlook and is trying to make the most of her life with travel and needs to be able to travel at times on her own and get to gates and up and down various stairs.  We are working her on this EVAL Patient is a 48 y.o. female who was seen today for physical therapy evaluation and treatment for aftercare following brain surgery. Her surgery was 2 years ago but she began having complication recently and seizures. She still has decreased coordination with RLE, and unable to do equal movements with both sides. R ankle DF has returned significantly but still missing about 10d. Pt would like to increase her mobility and ability to do things with her RLE to feel more balance and coordinated. She will benefit from PT to work on her overall strength, balance, and coordination to improve her confidence out in the community.   OBJECTIVE IMPAIRMENTS: Abnormal gait, decreased balance, decreased coordination, and decreased ROM.   ACTIVITY LIMITATIONS: locomotion level  PARTICIPATION LIMITATIONS: community activity and yard work  PERSONAL FACTORS: Fitness, Past/current experiences, and Time since onset of injury/illness/exacerbation are  also affecting patient's functional outcome.   REHAB POTENTIAL: Good  CLINICAL DECISION MAKING: Stable/uncomplicated  EVALUATION COMPLEXITY: Low  PLAN:  PT FREQUENCY: 1x/week  PT DURATION: 12 weeks  PLANNED INTERVENTIONS: 97110-Therapeutic exercises, 97530- Therapeutic activity, [id number]- Neuromuscular re-education, 97535- Self Care, [id number]- Manual therapy, [phone]- Gait training, [phone]- Electrical stimulation (manual), [phone]- Ultrasound, Patient/Family education, Balance training, Stair training, Taping, Dry Needling, Joint mobilization, Spinal mobilization, Cryotherapy, and Moist heat  PLAN FOR NEXT SESSION: push strength, endurance, balance and functional gait, higher level activities for an overall higher quality of life  OBADIAH OZELL ORN, PT 01/21/2024, 11:10 AM

## 2024-01-29 ENCOUNTER — Encounter: Payer: Self-pay | Admitting: Physical Therapy

## 2024-01-29 ENCOUNTER — Ambulatory Visit: Attending: Neurosurgery | Admitting: Physical Therapy

## 2024-01-29 DIAGNOSIS — Z483 Aftercare following surgery for neoplasm: Secondary | ICD-10-CM | POA: Insufficient documentation

## 2024-01-29 DIAGNOSIS — R2681 Unsteadiness on feet: Secondary | ICD-10-CM | POA: Insufficient documentation

## 2024-01-29 DIAGNOSIS — R262 Difficulty in walking, not elsewhere classified: Secondary | ICD-10-CM | POA: Diagnosis present

## 2024-01-29 DIAGNOSIS — M6281 Muscle weakness (generalized): Secondary | ICD-10-CM | POA: Insufficient documentation

## 2024-01-29 DIAGNOSIS — R293 Abnormal posture: Secondary | ICD-10-CM | POA: Insufficient documentation

## 2024-01-29 DIAGNOSIS — R278 Other lack of coordination: Secondary | ICD-10-CM | POA: Diagnosis present

## 2024-01-29 NOTE — Therapy (Signed)
 OUTPATIENT PHYSICAL THERAPY NEURO TREATMENT   Patient Name: Sheryl Mcmillan MRN: [id number] DOB:[birth year], 48 y.o., female Today's Date: 01/29/2024   PCP: Greig Drones REFERRING PROVIDER: [name]  END OF SESSION:  PT End of Session - 01/29/24 0929     Visit Number 14    Date for PT Re-Evaluation 03/23/24    PT Start Time 0930    PT Stop Time 1015    PT Time Calculation (min) 45 min    Activity Tolerance Patient tolerated treatment well    Behavior During Therapy [facility] for tasks assessed/performed                Past Medical History:  Diagnosis Date   Arthritis    Breast cancer (HCC)    Family history of non-Hodgkin's lymphoma 06/01/2020   GERD (gastroesophageal reflux disease)    Hemorrhoids    Migraines    PCOS (polycystic ovarian syndrome)    PONV (postoperative nausea and vomiting)    Seizures (HCC)    Past Surgical History:  Procedure Laterality Date   APPLICATION OF CRANIAL NAVIGATION Left 11/10/2021   Procedure: APPLICATION OF CRANIAL NAVIGATION;  Surgeon: [name], MD;  Location: [facility] OR;  Service: Neurosurgery;  Laterality: Left;   AXILLARY SENTINEL NODE BIOPSY Left 11/07/2020   Procedure: LEFT AXILLARY SENTINEL NODE BIOPSY;  Surgeon: [name], MD;  Location: [facility] OR;  Service: General;  Laterality: Left;   BREAST CYST EXCISION Right 09/22/2021   Procedure: Excision of right axillary excess soft tissue.;  Surgeon: [name], MD;  Location: [facility];  Service: Plastics;  Laterality: Right;   BREAST RECONSTRUCTION WITH PLACEMENT OF TISSUE EXPANDER AND FLEX HD (ACELLULAR HYDRATED DERMIS) Bilateral 11/07/2020   Procedure: BILATERAL BREAST RECONSTRUCTION WITH PLACEMENT OF TISSUE EXPANDER AND FLEX HD (ACELLULAR HYDRATED DERMIS);  Surgeon: [name], MD;  Location: [facility] OR;  Service: Plastics;  Laterality: Bilateral;   COSMETIC SURGERY     CRANIOTOMY Left 11/10/2021   Procedure: LT FRONTAL CRANIOTOMY TUMOR EXCISION;   Surgeon: [name], MD;  Location: [facility] OR;  Service: Neurosurgery;  Laterality: Left;   DILATION AND CURETTAGE OF UTERUS     HEMORRHOID SURGERY     IR IMAGING GUIDED PORT INSERTION  06/15/2020   LIPOSUCTION WITH LIPOFILLING Bilateral 09/22/2021   Procedure: Fat grafting bilateral breast;  Surgeon: [name], MD;  Location: [facility];  Service: Plastics;  Laterality: Bilateral;   MASS EXCISION Bilateral 04/25/2021   Procedure: excision of bilateral axillary breast tissue;  Surgeon: [name], MD;  Location: [facility];  Service: Plastics;  Laterality: Bilateral;   PORT-A-CATH REMOVAL Right 11/07/2020   Procedure: REMOVAL PORT-A-CATH;  Surgeon: [name], MD;  Location: [facility] OR;  Service: General;  Laterality: Right;   REMOVAL OF BILATERAL TISSUE EXPANDERS WITH PLACEMENT OF BILATERAL BREAST IMPLANTS Bilateral 04/25/2021   Procedure: REMOVAL OF BILATERAL TISSUE EXPANDERS WITH PLACEMENT OF BILATERAL BREAST IMPLANTS;  Surgeon: [name], MD;  Location: [facility];  Service: Plastics;  Laterality: Bilateral;   ROBOTIC ASSISTED TOTAL HYSTERECTOMY WITH BILATERAL SALPINGO OOPHERECTOMY Bilateral 03/14/2021   Procedure: XI ROBOTIC ASSISTED TOTAL HYSTERECTOMY WITH BILATERAL SALPINGO OOPHORECTOMY;  Surgeon: [name], MD;  Location: [facility];  Service: Gynecology;  Laterality: Bilateral;   TOTAL MASTECTOMY Bilateral 11/07/2020   Procedure: BILATERAL MASTECTOMY;  Surgeon: [name], MD;  Location: [facility] OR;  Service: General;  Laterality: Bilateral;   Patient Active Problem List   Diagnosis Date  Noted   Leukocytosis 04/12/2023   Hyperlipidemia 04/11/2023   Seizure (HCC) 04/11/2023   Brain tumor (HCC) 11/08/2021   Metastatic cancer to brain (HCC) 11/08/2021   Cerebral edema (HCC) 11/08/2021   Complex partial seizure (HCC) 11/08/2021   Brain mass 11/07/2021   Right sided weakness 11/07/2021   IDA (iron deficiency anemia)  07/18/2021   Cholelithiasis 03/14/2021   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   Obesity, Class III, BMI 40-49.9 (morbid obesity) 07/19/2020   BRCA1 gene mutation positive 07/04/2020   Genetic testing 06/28/2020   Family history of non-Hodgkin's lymphoma 06/01/2020   Malignant neoplasm of upper-outer quadrant of left breast in female, estrogen receptor positive (HCC) 05/26/2020   Acute right ankle pain 05/16/2016   Nondisplaced fracture of lateral malleolus of right fibula, initial encounter for closed fracture 05/16/2016    ONSET DATE: 09/24/23  REFERRING DIAG:  C79.31 (ICD-10-CM) - Secondary malignant neoplasm of brain    THERAPY DIAG:  Unsteadiness on feet  Muscle weakness (generalized)  Difficulty in walking, not elsewhere classified  Aftercare following surgery for neoplasm  Abnormal posture  Rationale for Evaluation and Treatment: Rehabilitation  SUBJECTIVE:                                                                                                                                                                                             SUBJECTIVE STATEMENT: [place]  EVAL: I started having seizures, 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 Last one was in October. I couldn't drive for 6 months. Have to go back next month for scans to check for brain activity and cancer. I feel good and better. The medicine is regulated now.  Pt accompanied by: self  PERTINENT HISTORY: hx of breast cancer and total masectomy, seizures, frontal craniotomy for tumor excision 11/10/21  PAIN:  Are you having pain? No  PRECAUTIONS: Fall  RED FLAGS: None   WEIGHT BEARING RESTRICTIONS: No  FALLS: Has patient fallen in last 6 months? No  LIVING ENVIRONMENT: Lives with: lives with their family Lives in: House/apartment Stairs: Yes: External: 3 steps; none Has following equipment at home: Single point cane  PLOF: Independent  PATIENT  GOALS: want to be more aligned, I can feel my gait is off. Want to be more balanced and coordinated   OBJECTIVE:  Note: Objective measures were completed at Evaluation unless otherwise noted.  DIAGNOSTIC FINDINGS: 07/08/23 Brain: Encephalomalacia and a small amount of chronic blood products are again noted at the left frontoparietal resection site. A small amount of nonsuspicious enhancement at the resection site is unchanged. No  new enhancing intracranial lesion is identified. The ventricles are normal in size. No acute infarct, midline shift, or extra-axial fluid collection is evident.  Unchanged left frontoparietal resection site. No evidence of new or progressive intracranial metastatic disease.    COGNITION: Overall cognitive status: Within functional limits for tasks assessed   SENSATION: WFL  COORDINATION: Some decreased motor control on RLE    MUSCLE LENGTH:  Some tightness in R hip, BL hamstrings, and in R ankle   POSTURE: rounded shoulders  LOWER EXTREMITY ROM:   all WFL, except R ankle DF still limited to 10d    LOWER EXTREMITY MMT:  grossly 5/5    FUNCTIONAL TESTS:   9 reps  30 seconds chair stand test Berg Balance Scale: 50/56  PATIENT SURVEYS:  ABC scale 65%                                                                                                                              TREATMENT DATE:  01/29/24 Elliptical level 4 x 6 minutes Seated Rows & Lats 35lb 2x12 Shoulder Ext 10lb 2x10  Rows 15lb 2x10 Triceps Ext 35lb 2x15 Alt 6in box taps x 10  From airex x 10 S2S OHP yellow ball 2x10  Chest Press 15lb 2x12  Shoulder flex & abd 4lb 2x10 Biceps curls 8lb 2x10   01/21/24 30 secondchair stand test 12, improved from 9 but still below her norm for age Elliptical level 4 x 4 minutes Walking ball toss Ball kicks On bosu reaching and reacting Rows 35# Lats 35# Chest press 15# 35# triceps 15# biceps 9# single arm overhead carry 20 # overhead  carry both arms  01/14/24 Gait outside around the back building 20# seated row Lats 20# 10# straight arm pull 10# chest press  10# biceps  25# triceps 20# farmer carry 10# overhead single arm carry, then 10# with both arms overhead 5# UE circuit 5# sit to stand with overhead press  01/07/24 Elliptical L3 x77mins  On airex cone taps  Trampoline jumps in and out, scissors, tried SL but unable to push off with RLE Step up to SL x10 each side  Leg curls 35# 2x10, R only x10 Leg extension 10# 2x10, R only x10  12/24/23 Elliptical level 4 x 4 minutes On airex cone toe touches On minitramp bounce, march, tried some hops with feet in and out Then using the pbars for feet in and out with small jump and feet staggered back and forth with small jump Tried some jumping forward and side ways Faster walk vs slow jog Tried skipping, could not do Walking over obstacles for longer step length Walking ball toss Standing ball kicks Standing cone toe touches with PT telling color and what foot, then same on airex  12/17/23 Leg curls 35# 3x10 Leg extension 10# 3x10 On airex sit to stand with OHP weighted ball Elliptical with SBA Level 4 x 3 minutes On airex ball toss, cone toe  touches On bosu ball toss and reaching 50# resisted side stepping Tmill push fwd and backward 25# rows 25# lats  12/10/23 NuStep L5x43mins  2 way hip 10# 2x10 Calf stretch 15s x2 on slant  Box taps 6  HS and ITB stretch with strap  Hip flexor stretch Single knee to chest stretch  12/03/23 Supine LE stretches Red tband ankle exercises Black tband clamshells Ball b/n knees squeeze Feet on ball K2C, rotation, bridges, isometric abs STM to the upper traps and rhomboids  11/19/23 Treadmill 2%, 2 mph x72mins   Resisted gait 30# 4 way x4  Leg ext 15# 2x12 HS curls 35# 2x12 Small lunge on BOSU Standing on BOSU balance then reaching for numbers  Alternating taps on 6 SLS on firm RLE- 7s at best     11/12/23 Gait outside 2 laps no rest good pace, some SOB Feet on ball K2C, rotation, bridges, isometric abs Passive piriformis stretch Black tband clamshells Bridges 3# on top of right foot DF, then some DF with eversion Standing calf raises Leg press 40# 2x10, then right only 3x 5  Walking ball toss  11/05/23 Nustep level 5 x 6 minutes On airex balance beam side stepping and tandem walking On airex volley ball On bosu reaching Direction changes On mat figure 8's On mat cone toe touches HS curls 35# 2x10, 20# right only 2x10 Leg extension 10# 2x10,  right 5# 2x10 Leg press 40# 2x10, then right only 20# x 10  10/29/23 NuStep L5x31mins Calf raises 2x12 Toe raises 2x12  Calf stretch 30s  Cone taps  Side steps on airex- CGA  Leg press 40# 2x10 Walking on beam   10/22/23 Walk outdoors 1 big lap  Rockerboard Step ups 6 Side steps on to airex ankleTB green 20 reps each way R  Resisted gait 30# 3way x4  establish HEP   10/09/23- EVAL    PATIENT EDUCATION: Education details: POC Person educated: Patient Education method: Explanation Education comprehension: verbalized understanding  HOME EXERCISE PROGRAM: Walking, SLS, doing some exercises with bands  Access Code: [email or web address] URL: [email or web address] Date: 10/22/2023 Prepared by: Almetta Fam  Exercises - Single Leg Stance  - 1 x daily - 7 x weekly - 2 sets - 10 hold - Tandem Stance  - 1 x daily - 7 x weekly - 2 sets - 10 reps - Sit to Stand  - 1 x daily - 7 x weekly - 2 sets - 10 reps - Heel Raises with Counter Support  - 1 x daily - 7 x weekly - 2 sets - 10 reps - Heel Toe Raises with Counter Support  - 1 x daily - 7 x weekly - 2 sets - 10 reps  GOALS: Goals reviewed with patient? Yes  SHORT TERM GOALS: Target date: 11/27/23  Patient will be independent with initial HEP. Baseline:  Goal status: met with walking program 11/05/23  2.  Patient will improve R ankle DF to 20d Baseline:  10d Goal status:  met 12/03/23    LONG TERM GOALS: Target date: 01/15/24  Patient will be independent with advanced/ongoing HEP to improve outcomes and carryover.  Baseline:  Goal status: progressing 01/21/24  2.  Patient will be able stand SLS on RLE >10s on firm and foam surfaces Baseline: 5s at best on firm  Goal status: progressing 01/21/24  3.  Patient will be able to do at least 10 alternating taps on 6 in 20s  Baseline: 6 on 4 in 20s  Goal status:  MET 11/19/23   4.  Patient will demonstrate 12 reps or more with 30s chair test Baseline: 9 Goal status: at 12 reps on 01/21/24  5.  Patient will improve ABC confidence score to 75-80%  Baseline: 65%  Goal status: met 01/14/24   ASSESSMENT:  CLINICAL IMPRESSION: Overall patient continues to improve with her balance, strength, function and overall quality of life.  Per pt request session focused on upper body strength. Postural cues needed with shoulder Ext and standing rows. Pt really struggled with shoulder abduction with RUE. Som initial uncertainty standing on airex with box taps. Good effort throughout session.  EVAL Patient is a 48 y.o. female who was seen today for physical therapy evaluation and treatment for aftercare following brain surgery. Her surgery was 2 years ago but she began having complication recently and seizures. She still has decreased coordination with RLE, and unable to do equal movements with both sides. R ankle DF has returned significantly but still missing about 10d. Pt would like to increase her mobility and ability to do things with her RLE to feel more balance and coordinated. She will benefit from PT to work on her overall strength, balance, and coordination to improve her confidence out in the community.   OBJECTIVE IMPAIRMENTS: Abnormal gait, decreased balance, decreased coordination, and decreased ROM.   ACTIVITY LIMITATIONS: locomotion level  PARTICIPATION LIMITATIONS: community activity and yard  work  PERSONAL FACTORS: Fitness, Past/current experiences, and Time since onset of injury/illness/exacerbation are also affecting patient's functional outcome.   REHAB POTENTIAL: Good  CLINICAL DECISION MAKING: Stable/uncomplicated  EVALUATION COMPLEXITY: Low  PLAN:  PT FREQUENCY: 1x/week  PT DURATION: 12 weeks  PLANNED INTERVENTIONS: [id number]-Therapeutic exercises, 97530- Therapeutic activity, [id number]- Neuromuscular re-education, 97535- Self Care, [id number]- Manual therapy, [phone]- Gait training, [phone]- Electrical stimulation (manual), [phone]- Ultrasound, Patient/Family education, Balance training, Stair training, Taping, Dry Needling, Joint mobilization, Spinal mobilization, Cryotherapy, and Moist heat  PLAN FOR NEXT SESSION: push strength, endurance, balance and functional gait, higher level activities for an overall higher quality of life  Tanda KANDICE Sorrow, PTA 01/29/2024, 9:29 AM

## 2024-01-31 ENCOUNTER — Other Ambulatory Visit: Payer: Self-pay | Admitting: Internal Medicine

## 2024-02-03 ENCOUNTER — Encounter: Payer: Self-pay | Admitting: Physical Therapy

## 2024-02-03 ENCOUNTER — Ambulatory Visit: Admitting: Physical Therapy

## 2024-02-03 ENCOUNTER — Encounter: Payer: Self-pay | Admitting: Hematology and Oncology

## 2024-02-03 DIAGNOSIS — R2681 Unsteadiness on feet: Secondary | ICD-10-CM

## 2024-02-03 DIAGNOSIS — R262 Difficulty in walking, not elsewhere classified: Secondary | ICD-10-CM

## 2024-02-03 DIAGNOSIS — M6281 Muscle weakness (generalized): Secondary | ICD-10-CM

## 2024-02-03 DIAGNOSIS — R293 Abnormal posture: Secondary | ICD-10-CM

## 2024-02-03 DIAGNOSIS — Z483 Aftercare following surgery for neoplasm: Secondary | ICD-10-CM

## 2024-02-03 NOTE — Therapy (Signed)
 OUTPATIENT PHYSICAL THERAPY NEURO TREATMENT   Patient Name: Sheryl Mcmillan MRN: [id number] DOB:Nov 25, 1975, 48 y.o., female Today's Date: 02/03/2024   PCP: Greig Drones REFERRING PROVIDER: [name]  END OF SESSION:  PT End of Session - 02/03/24 1017     Visit Number 15    Date for PT Re-Evaluation 03/23/24    PT Start Time 1017    PT Stop Time 1100    PT Time Calculation (min) 43 min    Activity Tolerance Patient tolerated treatment well    Behavior During Therapy [facility] for tasks assessed/performed                Past Medical History:  Diagnosis Date   Arthritis    Breast cancer (HCC)    Family history of non-Hodgkin's lymphoma 06/01/2020   GERD (gastroesophageal reflux disease)    Hemorrhoids    Migraines    PCOS (polycystic ovarian syndrome)    PONV (postoperative nausea and vomiting)    Seizures (HCC)    Past Surgical History:  Procedure Laterality Date   APPLICATION OF CRANIAL NAVIGATION Left 11/10/2021   Procedure: APPLICATION OF CRANIAL NAVIGATION;  Surgeon: [name], MD;  Location: [facility] OR;  Service: Neurosurgery;  Laterality: Left;   AXILLARY SENTINEL NODE BIOPSY Left 11/07/2020   Procedure: LEFT AXILLARY SENTINEL NODE BIOPSY;  Surgeon: [name], MD;  Location: [facility] OR;  Service: General;  Laterality: Left;   BREAST CYST EXCISION Right 09/22/2021   Procedure: Excision of right axillary excess soft tissue.;  Surgeon: [name], MD;  Location: [facility];  Service: Plastics;  Laterality: Right;   BREAST RECONSTRUCTION WITH PLACEMENT OF TISSUE EXPANDER AND FLEX HD (ACELLULAR HYDRATED DERMIS) Bilateral 11/07/2020   Procedure: BILATERAL BREAST RECONSTRUCTION WITH PLACEMENT OF TISSUE EXPANDER AND FLEX HD (ACELLULAR HYDRATED DERMIS);  Surgeon: [name], MD;  Location: [facility] OR;  Service: Plastics;  Laterality: Bilateral;   COSMETIC SURGERY     CRANIOTOMY Left 11/10/2021   Procedure: LT FRONTAL CRANIOTOMY TUMOR  EXCISION;  Surgeon: [name], MD;  Location: [facility] OR;  Service: Neurosurgery;  Laterality: Left;   DILATION AND CURETTAGE OF UTERUS     HEMORRHOID SURGERY     IR IMAGING GUIDED PORT INSERTION  06/15/2020   LIPOSUCTION WITH LIPOFILLING Bilateral 09/22/2021   Procedure: Fat grafting bilateral breast;  Surgeon: [name], MD;  Location: [facility];  Service: Plastics;  Laterality: Bilateral;   MASS EXCISION Bilateral 04/25/2021   Procedure: excision of bilateral axillary breast tissue;  Surgeon: [name], MD;  Location: [facility];  Service: Plastics;  Laterality: Bilateral;   PORT-A-CATH REMOVAL Right 11/07/2020   Procedure: REMOVAL PORT-A-CATH;  Surgeon: [name], MD;  Location: [facility] OR;  Service: General;  Laterality: Right;   REMOVAL OF BILATERAL TISSUE EXPANDERS WITH PLACEMENT OF BILATERAL BREAST IMPLANTS Bilateral 04/25/2021   Procedure: REMOVAL OF BILATERAL TISSUE EXPANDERS WITH PLACEMENT OF BILATERAL BREAST IMPLANTS;  Surgeon: [name], MD;  Location: [facility];  Service: Plastics;  Laterality: Bilateral;   ROBOTIC ASSISTED TOTAL HYSTERECTOMY WITH BILATERAL SALPINGO OOPHERECTOMY Bilateral 03/14/2021   Procedure: XI ROBOTIC ASSISTED TOTAL HYSTERECTOMY WITH BILATERAL SALPINGO OOPHORECTOMY;  Surgeon: [name], MD;  Location: [facility];  Service: Gynecology;  Laterality: Bilateral;   TOTAL MASTECTOMY Bilateral 11/07/2020   Procedure: BILATERAL MASTECTOMY;  Surgeon: [name], MD;  Location: [facility] OR;  Service: General;  Laterality: Bilateral;   Patient Active Problem List   Diagnosis Date  Noted   Leukocytosis 04/12/2023   Hyperlipidemia 04/11/2023   Seizure (HCC) 04/11/2023   Brain tumor (HCC) 11/08/2021   Metastatic cancer to brain (HCC) 11/08/2021   Cerebral edema (HCC) 11/08/2021   Complex partial seizure (HCC) 11/08/2021   Brain mass 11/07/2021   Right sided weakness 11/07/2021   IDA (iron deficiency  anemia) 07/18/2021   Cholelithiasis 03/14/2021   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   Obesity, Class III, BMI 40-49.9 (morbid obesity) 07/19/2020   BRCA1 gene mutation positive 07/04/2020   Genetic testing 06/28/2020   Family history of non-Hodgkin's lymphoma 06/01/2020   Malignant neoplasm of upper-outer quadrant of left breast in female, estrogen receptor positive (HCC) 05/26/2020   Acute right ankle pain 05/16/2016   Nondisplaced fracture of lateral malleolus of right fibula, initial encounter for closed fracture 05/16/2016    ONSET DATE: 09/24/23  REFERRING DIAG:  C79.31 (ICD-10-CM) - Secondary malignant neoplasm of brain    THERAPY DIAG:  Unsteadiness on feet  Muscle weakness (generalized)  Difficulty in walking, not elsewhere classified  Aftercare following surgery for neoplasm  Abnormal posture  Rationale for Evaluation and Treatment: Rehabilitation  SUBJECTIVE:                                                                                                                                                                                             SUBJECTIVE STATEMENT: WE are feeling great  EVAL: I started having seizures, 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 Last one was in October. I couldn't drive for 6 months. Have to go back next month for scans to check for brain activity and cancer. I feel good and better. The medicine is regulated now.  Pt accompanied by: self  PERTINENT HISTORY: hx of breast cancer and total masectomy, seizures, frontal craniotomy for tumor excision 11/10/21  PAIN:  Are you having pain? No  PRECAUTIONS: Fall  RED FLAGS: None   WEIGHT BEARING RESTRICTIONS: No  FALLS: Has patient fallen in last 6 months? No  LIVING ENVIRONMENT: Lives with: lives with their family Lives in: House/apartment Stairs: Yes: External: 3 steps; none Has following equipment at home: Single point cane  PLOF: Independent  PATIENT GOALS:  want to be more aligned, I can feel my gait is off. Want to be more balanced and coordinated   OBJECTIVE:  Note: Objective measures were completed at Evaluation unless otherwise noted.  DIAGNOSTIC FINDINGS: 07/08/23 Brain: Encephalomalacia and a small amount of chronic blood products are again noted at the left frontoparietal resection site. A small amount of nonsuspicious enhancement at the resection site is unchanged. No new enhancing intracranial lesion  is identified. The ventricles are normal in size. No acute infarct, midline shift, or extra-axial fluid collection is evident.  Unchanged left frontoparietal resection site. No evidence of new or progressive intracranial metastatic disease.    COGNITION: Overall cognitive status: Within functional limits for tasks assessed   SENSATION: WFL  COORDINATION: Some decreased motor control on RLE    MUSCLE LENGTH:  Some tightness in R hip, BL hamstrings, and in R ankle   POSTURE: rounded shoulders  LOWER EXTREMITY ROM:   all WFL, except R ankle DF still limited to 10d    LOWER EXTREMITY MMT:  grossly 5/5    FUNCTIONAL TESTS:   9 reps  30 seconds chair stand test Berg Balance Scale: 50/56  PATIENT SURVEYS:  ABC scale 65%                                                                                                                              TREATMENT DATE:  02/03/24 Elliptical level 4 x 6 minutes SLS LLE x10 sec, RLE 3 sec 30 second chair stand test 12,  still below her norm for age Seated Rows & Lats 35lb 2x15 Chest Press 20lb 2x12  Triceps Ext 25lb 2x15 6in step ups x10 each  S2S OHP yellow ball 2x10  Shoulder abd & Flex 3lb 2x10  01/29/24 Elliptical level 4 x 6 minutes Seated Rows & Lats 35lb 2x12 Shoulder Ext 10lb 2x10  Rows 15lb 2x10 Triceps Ext 35lb 2x15 Alt 6in box taps x 10  From airex x 10 S2S OHP yellow ball 2x10  Chest Press 15lb 2x12  Shoulder flex & abd 4lb 2x10 Biceps curls 8lb  2x10   01/21/24 30 secondchair stand test 12, improved from 9 but still below her norm for age Elliptical level 4 x 4 minutes Walking ball toss Ball kicks On bosu reaching and reacting Rows 35# Lats 35# Chest press 15# 35# triceps 15# biceps 9# single arm overhead carry 20 # overhead carry both arms  01/14/24 Gait outside around the back building 20# seated row Lats 20# 10# straight arm pull 10# chest press  10# biceps  25# triceps 20# farmer carry 10# overhead single arm carry, then 10# with both arms overhead 5# UE circuit 5# sit to stand with overhead press  01/07/24 Elliptical L3 x75mins  On airex cone taps  Trampoline jumps in and out, scissors, tried SL but unable to push off with RLE Step up to SL x10 each side  Leg curls 35# 2x10, R only x10 Leg extension 10# 2x10, R only x10  12/24/23 Elliptical level 4 x 4 minutes On airex cone toe touches On minitramp bounce, march, tried some hops with feet in and out Then using the pbars for feet in and out with small jump and feet staggered back and forth with small jump Tried some jumping forward and side ways Faster walk vs slow jog Tried skipping, could not do Walking over obstacles for  longer step length Walking ball toss Standing ball kicks Standing cone toe touches with PT telling color and what foot, then same on airex  12/17/23 Leg curls 35# 3x10 Leg extension 10# 3x10 On airex sit to stand with OHP weighted ball Elliptical with SBA Level 4 x 3 minutes On airex ball toss, cone toe touches On bosu ball toss and reaching 50# resisted side stepping Tmill push fwd and backward 25# rows 25# lats  12/10/23 NuStep L5x65mins  2 way hip 10# 2x10 Calf stretch 15s x2 on slant  Box taps 6  HS and ITB stretch with strap  Hip flexor stretch Single knee to chest stretch  12/03/23 Supine LE stretches Red tband ankle exercises Black tband clamshells Ball b/n knees squeeze Feet on ball K2C, rotation, bridges,  isometric abs STM to the upper traps and rhomboids  11/19/23 Treadmill 2%, 2 mph x6mins   Resisted gait 30# 4 way x4  Leg ext 15# 2x12 HS curls 35# 2x12 Small lunge on BOSU Standing on BOSU balance then reaching for numbers  Alternating taps on 6 SLS on firm RLE- 7s at best    11/12/23 Gait outside 2 laps no rest good pace, some SOB Feet on ball K2C, rotation, bridges, isometric abs Passive piriformis stretch Black tband clamshells Bridges 3# on top of right foot DF, then some DF with eversion Standing calf raises Leg press 40# 2x10, then right only 3x 5  Walking ball toss  11/05/23 Nustep level 5 x 6 minutes On airex balance beam side stepping and tandem walking On airex volley ball On bosu reaching Direction changes On mat figure 8's On mat cone toe touches HS curls 35# 2x10, 20# right only 2x10 Leg extension 10# 2x10,  right 5# 2x10 Leg press 40# 2x10, then right only 20# x 10   PATIENT EDUCATION: Education details: POC Person educated: Patient Education method: Explanation Education comprehension: verbalized understanding  HOME EXERCISE PROGRAM: Walking, SLS, doing some exercises with bands  Access Code: [email or web address] URL: [email or web address] Date: 10/22/2023 Prepared by: Almetta Fam  Exercises - Single Leg Stance  - 1 x daily - 7 x weekly - 2 sets - 10 hold - Tandem Stance  - 1 x daily - 7 x weekly - 2 sets - 10 reps - Sit to Stand  - 1 x daily - 7 x weekly - 2 sets - 10 reps - Heel Raises with Counter Support  - 1 x daily - 7 x weekly - 2 sets - 10 reps - Heel Toe Raises with Counter Support  - 1 x daily - 7 x weekly - 2 sets - 10 reps  GOALS: Goals reviewed with patient? Yes  SHORT TERM GOALS: Target date: 11/27/23  Patient will be independent with initial HEP. Baseline:  Goal status: met with walking program 11/05/23  2.  Patient will improve R ankle DF to 20d Baseline: 10d Goal status:  met 12/03/23    LONG TERM GOALS: Target  date: 01/15/24  Patient will be independent with advanced/ongoing HEP to improve outcomes and carryover.  Baseline:  Goal status: progressing 01/21/24  2.  Patient will be able stand SLS on RLE >10s on firm and foam surfaces Baseline: 5s at best on firm  Goal status: progressing 01/21/24, ongoing 02/03/24  3.  Patient will be able to do at least 10 alternating taps on 6 in 20s  Baseline: 6 on 4 in 20s  Goal status: MET 11/19/23   4.  Patient will demonstrate 12 reps  or more with 30s chair test Baseline: 9 Goal status: at 12 reps on 01/21/24, Met 02/03/24  5.  Patient will improve ABC confidence score to 75-80%  Baseline: 65%  Goal status: met 01/14/24   ASSESSMENT:  CLINICAL IMPRESSION: Overall patient continues to improve with her balance, strength, function and overall quality of life. She remains limited with RLR SLS.   Some instability and hesitation with step ups but able to complete. Increase reps tolerated with seated rows and lats. Good effort given during session pt is progressing towards goals.  EVAL Patient is a 48 y.o. female who was seen today for physical therapy evaluation and treatment for aftercare following brain surgery. Her surgery was 2 years ago but she began having complication recently and seizures. She still has decreased coordination with RLE, and unable to do equal movements with both sides. R ankle DF has returned significantly but still missing about 10d. Pt would like to increase her mobility and ability to do things with her RLE to feel more balance and coordinated. She will benefit from PT to work on her overall strength, balance, and coordination to improve her confidence out in the community.   OBJECTIVE IMPAIRMENTS: Abnormal gait, decreased balance, decreased coordination, and decreased ROM.   ACTIVITY LIMITATIONS: locomotion level  PARTICIPATION LIMITATIONS: community activity and yard work  PERSONAL FACTORS: Fitness, Past/current experiences, and  Time since onset of injury/illness/exacerbation are also affecting patient's functional outcome.   REHAB POTENTIAL: Good  CLINICAL DECISION MAKING: Stable/uncomplicated  EVALUATION COMPLEXITY: Low  PLAN:  PT FREQUENCY: 1x/week  PT DURATION: 12 weeks  PLANNED INTERVENTIONS: 97110-Therapeutic exercises, 97530- Therapeutic activity, [id number]- Neuromuscular re-education, 97535- Self Care, [id number]- Manual therapy, [phone]- Gait training, [phone]- Electrical stimulation (manual), [phone]- Ultrasound, Patient/Family education, Balance training, Stair training, Taping, Dry Needling, Joint mobilization, Spinal mobilization, Cryotherapy, and Moist heat  PLAN FOR NEXT SESSION: push strength, endurance, balance and functional gait, higher level activities for an overall higher quality of life  Tanda KANDICE Sorrow, PTA 02/03/2024, 10:17 AM

## 2024-02-04 ENCOUNTER — Encounter: Payer: Self-pay | Admitting: Hematology and Oncology

## 2024-02-06 ENCOUNTER — Encounter: Payer: Self-pay | Admitting: *Deleted

## 2024-02-06 NOTE — Progress Notes (Signed)
 Pt daughter dropped off request for independent assessment for personal care services attestion of medical need form.  MD completed MD section and returned to front desk for daughter to pick up.  RN attempt x1 to contact daughter, no answer, LVM.

## 2024-02-06 NOTE — Progress Notes (Signed)
 Per pt request RN faxed competed independent assessment forms to [facility] at [phone].

## 2024-02-10 ENCOUNTER — Ambulatory Visit (INDEPENDENT_AMBULATORY_CARE_PROVIDER_SITE_OTHER): Admitting: Dermatology

## 2024-02-10 ENCOUNTER — Encounter: Payer: Self-pay | Admitting: Dermatology

## 2024-02-10 DIAGNOSIS — Z85828 Personal history of other malignant neoplasm of skin: Secondary | ICD-10-CM

## 2024-02-10 DIAGNOSIS — L578 Other skin changes due to chronic exposure to nonionizing radiation: Secondary | ICD-10-CM

## 2024-02-10 DIAGNOSIS — W908XXA Exposure to other nonionizing radiation, initial encounter: Secondary | ICD-10-CM | POA: Diagnosis not present

## 2024-02-10 DIAGNOSIS — D049 Carcinoma in situ of skin, unspecified: Secondary | ICD-10-CM

## 2024-02-10 NOTE — Patient Instructions (Signed)

## 2024-02-10 NOTE — Progress Notes (Signed)
   Follow-Up Visit   Subjective  Sheryl Mcmillan is a 48 y.o. 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 She has healed well after a brisk reaction to the topical 5FU. She states that she is going on a cruise and wants to know good sun protective information.   The following portions of the chart were reviewed this encounter and updated as appropriate: medications, allergies, medical history  Review of Systems:  No other skin or systemic complaints except as noted in HPI or Assessment and Plan.  Objective  Well appearing patient in no apparent distress; mood and affect are within normal limits.  A focused examination was performed of the following areas: Chest Face  Relevant exam findings are noted in the Assessment and Plan.    Assessment & Plan   ACTINIC DAMAGE - chronic, secondary to cumulative UV radiation exposure/sun exposure over time - diffuse scaly erythematous macules with underlying dyspigmentation - Recommend daily broad spectrum sunscreen SPF 30+ to sun-exposed areas, reapply every 2 hours as needed.  - Recommend staying in the shade or wearing long sleeves, sun glasses (UVA+UVB protection) and wide brim hats (4-inch brim around the entire circumference of the hat). - Call for new or changing lesions.  HISTORY OF SQUAMOUS CELL CARCINOMA IN SITU OF THE SKIN -previously treated a large area with Efudex  for 6 weeks with a substantial reaction. - No evidence of recurrence today - Recommend regular full body skin exams - Recommend daily broad spectrum sunscreen SPF 30+ to sun-exposed areas, reapply every 2 hours as needed.  - Call if any new or changing lesions are noted between office visits     Return in about 4 months (around 06/11/2024) for [facility]SC .  I, Berwyn Lesches, Surg Tech III, am acting as scribe for RUFUS CHRISTELLA HOLY, MD.   Documentation: I have reviewed the above documentation for accuracy and completeness, and I agree  with the above.  RUFUS CHRISTELLA HOLY, MD

## 2024-02-11 NOTE — Therapy (Signed)
 OUTPATIENT PHYSICAL THERAPY NEURO TREATMENT   Patient Name: Sheryl Mcmillan MRN: [id number] DOB:1976-03-25, 48 y.o., female Today's Date: 02/12/2024   PCP: Greig Drones REFERRING PROVIDER: [name]  END OF SESSION:  PT End of Session - 02/12/24 0849     Visit Number 16    Date for PT Re-Evaluation 03/23/24    PT Start Time 0849    PT Stop Time 0930    PT Time Calculation (min) 41 min    Activity Tolerance Patient tolerated treatment well    Behavior During Therapy [facility] for tasks assessed/performed                 Past Medical History:  Diagnosis Date   Arthritis    Breast cancer (HCC)    Family history of non-Hodgkin's lymphoma 06/01/2020   GERD (gastroesophageal reflux disease)    Hemorrhoids    Migraines    PCOS (polycystic ovarian syndrome)    PONV (postoperative nausea and vomiting)    Seizures (HCC)    Squamous cell carcinoma of skin    Past Surgical History:  Procedure Laterality Date   APPLICATION OF CRANIAL NAVIGATION Left 11/10/2021   Procedure: APPLICATION OF CRANIAL NAVIGATION;  Surgeon: [name], MD;  Location: [facility] OR;  Service: Neurosurgery;  Laterality: Left;   AXILLARY SENTINEL NODE BIOPSY Left 11/07/2020   Procedure: LEFT AXILLARY SENTINEL NODE BIOPSY;  Surgeon: [name], MD;  Location: [facility] OR;  Service: General;  Laterality: Left;   BREAST CYST EXCISION Right 09/22/2021   Procedure: Excision of right axillary excess soft tissue.;  Surgeon: [name], MD;  Location: [facility];  Service: Plastics;  Laterality: Right;   BREAST RECONSTRUCTION WITH PLACEMENT OF TISSUE EXPANDER AND FLEX HD (ACELLULAR HYDRATED DERMIS) Bilateral 11/07/2020   Procedure: BILATERAL BREAST RECONSTRUCTION WITH PLACEMENT OF TISSUE EXPANDER AND FLEX HD (ACELLULAR HYDRATED DERMIS);  Surgeon: [name], MD;  Location: [facility] OR;  Service: Plastics;  Laterality: Bilateral;   COSMETIC SURGERY     CRANIOTOMY Left 11/10/2021    Procedure: LT FRONTAL CRANIOTOMY TUMOR EXCISION;  Surgeon: [name], MD;  Location: [facility] OR;  Service: Neurosurgery;  Laterality: Left;   DILATION AND CURETTAGE OF UTERUS     HEMORRHOID SURGERY     IR IMAGING GUIDED PORT INSERTION  06/15/2020   LIPOSUCTION WITH LIPOFILLING Bilateral 09/22/2021   Procedure: Fat grafting bilateral breast;  Surgeon: [name], MD;  Location: [facility];  Service: Plastics;  Laterality: Bilateral;   MASS EXCISION Bilateral 04/25/2021   Procedure: excision of bilateral axillary breast tissue;  Surgeon: [name], MD;  Location: [facility];  Service: Plastics;  Laterality: Bilateral;   PORT-A-CATH REMOVAL Right 11/07/2020   Procedure: REMOVAL PORT-A-CATH;  Surgeon: [name], MD;  Location: [facility] OR;  Service: General;  Laterality: Right;   REMOVAL OF BILATERAL TISSUE EXPANDERS WITH PLACEMENT OF BILATERAL BREAST IMPLANTS Bilateral 04/25/2021   Procedure: REMOVAL OF BILATERAL TISSUE EXPANDERS WITH PLACEMENT OF BILATERAL BREAST IMPLANTS;  Surgeon: [name], MD;  Location: [facility];  Service: Plastics;  Laterality: Bilateral;   ROBOTIC ASSISTED TOTAL HYSTERECTOMY WITH BILATERAL SALPINGO OOPHERECTOMY Bilateral 03/14/2021   Procedure: XI ROBOTIC ASSISTED TOTAL HYSTERECTOMY WITH BILATERAL SALPINGO OOPHORECTOMY;  Surgeon: [name], MD;  Location: [facility];  Service: Gynecology;  Laterality: Bilateral;   TOTAL MASTECTOMY Bilateral 11/07/2020   Procedure: BILATERAL MASTECTOMY;  Surgeon: [name], MD;  Location: [facility] OR;  Service: General;  Laterality: Bilateral;  Patient Active Problem List   Diagnosis Date Noted   Leukocytosis 04/12/2023   Hyperlipidemia 04/11/2023   Seizure (HCC) 04/11/2023   Brain tumor (HCC) 11/08/2021   Metastatic cancer to brain (HCC) 11/08/2021   Cerebral edema (HCC) 11/08/2021   Complex partial seizure (HCC) 11/08/2021   Brain mass 11/07/2021   Right sided  weakness 11/07/2021   IDA (iron deficiency anemia) 07/18/2021   Cholelithiasis 03/14/2021   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   Obesity, Class III, BMI 40-49.9 (morbid obesity) 07/19/2020   BRCA1 gene mutation positive 07/04/2020   Genetic testing 06/28/2020   Family history of non-Hodgkin's lymphoma 06/01/2020   Malignant neoplasm of upper-outer quadrant of left breast in female, estrogen receptor positive (HCC) 05/26/2020   Acute right ankle pain 05/16/2016   Nondisplaced fracture of lateral malleolus of right fibula, initial encounter for closed fracture 05/16/2016    ONSET DATE: 09/24/23  REFERRING DIAG:  C79.31 (ICD-10-CM) - Secondary malignant neoplasm of brain    THERAPY DIAG:  Unsteadiness on feet  Other lack of coordination  Muscle weakness (generalized)  Rationale for Evaluation and Treatment: Rehabilitation  SUBJECTIVE:                                                                                                                                                                                             SUBJECTIVE STATEMENT: I am good, sleepy but I am good.   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 Last one was in October. I couldn't drive for 6 months. Have to go back next month for scans to check for brain activity and cancer. I feel good and better. The medicine is regulated now.  Pt accompanied by: self  PERTINENT HISTORY: hx of breast cancer and total masectomy, seizures, frontal craniotomy for tumor excision 11/10/21  PAIN:  Are you having pain? No  PRECAUTIONS: Fall  RED FLAGS: None   WEIGHT BEARING RESTRICTIONS: No  FALLS: Has patient fallen in last 6 months? No  LIVING ENVIRONMENT: Lives with: lives with their family Lives in: House/apartment Stairs: Yes: External: 3 steps; none Has following equipment at home: Single point cane  PLOF: Independent  PATIENT GOALS: want to be more aligned, I can  feel my gait is off. Want to be more balanced and coordinated   OBJECTIVE:  Note: Objective measures were completed at Evaluation unless otherwise noted.  DIAGNOSTIC FINDINGS: 07/08/23 Brain: Encephalomalacia and a small amount of chronic blood products are again noted at the left frontoparietal resection site. A small amount of nonsuspicious enhancement at the resection site is unchanged. No new enhancing  intracranial lesion is identified. The ventricles are normal in size. No acute infarct, midline shift, or extra-axial fluid collection is evident.  Unchanged left frontoparietal resection site. No evidence of new or progressive intracranial metastatic disease.    COGNITION: Overall cognitive status: Within functional limits for tasks assessed   SENSATION: WFL  COORDINATION: Some decreased motor control on RLE    MUSCLE LENGTH:  Some tightness in R hip, BL hamstrings, and in R ankle   POSTURE: rounded shoulders  LOWER EXTREMITY ROM:   all WFL, except R ankle DF still limited to 10d    LOWER EXTREMITY MMT:  grossly 5/5    FUNCTIONAL TESTS:   9 reps  30 seconds chair stand test Berg Balance Scale: 50/56  PATIENT SURVEYS:  ABC scale 65%                                                                                                                              TREATMENT DATE:  02/12/24 Elliptical L4 x24mins  Cone taps on airex different colors- unable to keep foot up in SL Resisted gait side steps over obstacles Leg ext 15#  HS curls 35# 2x10  Walking on beam Balance on BOSU, then reaching for numbers  Shoulder ext 15# 2x10 Cable rows 15# 2x10 4# flexion to abd 2x10   02/03/24 Elliptical level 4 x 6 minutes SLS LLE x10 sec, RLE 3 sec 30 second chair stand test 12,  still below her norm for age Seated Rows & Lats 35lb 2x15 Chest Press 20lb 2x12  Triceps Ext 25lb 2x15 6in step ups x10 each  S2S OHP yellow ball 2x10  Shoulder abd & Flex 3lb  2x10  01/29/24 Elliptical level 4 x 6 minutes Seated Rows & Lats 35lb 2x12 Shoulder Ext 10lb 2x10  Rows 15lb 2x10 Triceps Ext 35lb 2x15 Alt 6in box taps x 10  From airex x 10 S2S OHP yellow ball 2x10  Chest Press 15lb 2x12  Shoulder flex & abd 4lb 2x10 Biceps curls 8lb 2x10   01/21/24 30 secondchair stand test 12, improved from 9 but still below her norm for age Elliptical level 4 x 4 minutes Walking ball toss Ball kicks On bosu reaching and reacting Rows 35# Lats 35# Chest press 15# 35# triceps 15# biceps 9# single arm overhead carry 20 # overhead carry both arms  01/14/24 Gait outside around the back building 20# seated row Lats 20# 10# straight arm pull 10# chest press  10# biceps  25# triceps 20# farmer carry 10# overhead single arm carry, then 10# with both arms overhead 5# UE circuit 5# sit to stand with overhead press  01/07/24 Elliptical L3 x8mins  On airex cone taps  Trampoline jumps in and out, scissors, tried SL but unable to push off with RLE Step up to SL x10 each side  Leg curls 35# 2x10, R only x10 Leg extension 10# 2x10, R only x10  12/24/23 Elliptical level 4 x  4 minutes On airex cone toe touches On minitramp bounce, march, tried some hops with feet in and out Then using the pbars for feet in and out with small jump and feet staggered back and forth with small jump Tried some jumping forward and side ways Faster walk vs slow jog Tried skipping, could not do Walking over obstacles for longer step length Walking ball toss Standing ball kicks Standing cone toe touches with PT telling color and what foot, then same on airex  12/17/23 Leg curls 35# 3x10 Leg extension 10# 3x10 On airex sit to stand with OHP weighted ball Elliptical with SBA Level 4 x 3 minutes On airex ball toss, cone toe touches On bosu ball toss and reaching 50# resisted side stepping Tmill push fwd and backward 25# rows 25# lats  12/10/23 NuStep L5x48mins  2 way hip  10# 2x10 Calf stretch 15s x2 on slant  Box taps 6  HS and ITB stretch with strap  Hip flexor stretch Single knee to chest stretch  12/03/23 Supine LE stretches Red tband ankle exercises Black tband clamshells Ball b/n knees squeeze Feet on ball K2C, rotation, bridges, isometric abs STM to the upper traps and rhomboids  11/19/23 Treadmill 2%, 2 mph x56mins   Resisted gait 30# 4 way x4  Leg ext 15# 2x12 HS curls 35# 2x12 Small lunge on BOSU Standing on BOSU balance then reaching for numbers  Alternating taps on 6 SLS on firm RLE- 7s at best    11/12/23 Gait outside 2 laps no rest good pace, some SOB Feet on ball K2C, rotation, bridges, isometric abs Passive piriformis stretch Black tband clamshells Bridges 3# on top of right foot DF, then some DF with eversion Standing calf raises Leg press 40# 2x10, then right only 3x 5  Walking ball toss  11/05/23 Nustep level 5 x 6 minutes On airex balance beam side stepping and tandem walking On airex volley ball On bosu reaching Direction changes On mat figure 8's On mat cone toe touches HS curls 35# 2x10, 20# right only 2x10 Leg extension 10# 2x10,  right 5# 2x10 Leg press 40# 2x10, then right only 20# x 10   PATIENT EDUCATION: Education details: POC Person educated: Patient Education method: Explanation Education comprehension: verbalized understanding  HOME EXERCISE PROGRAM: Walking, SLS, doing some exercises with bands  Access Code: [email or web address] URL: [email or web address] Date: 10/22/2023 Prepared by: Almetta Fam  Exercises - Single Leg Stance  - 1 x daily - 7 x weekly - 2 sets - 10 hold - Tandem Stance  - 1 x daily - 7 x weekly - 2 sets - 10 reps - Sit to Stand  - 1 x daily - 7 x weekly - 2 sets - 10 reps - Heel Raises with Counter Support  - 1 x daily - 7 x weekly - 2 sets - 10 reps - Heel Toe Raises with Counter Support  - 1 x daily - 7 x weekly - 2 sets - 10 reps  GOALS: Goals reviewed with  patient? Yes  SHORT TERM GOALS: Target date: 11/27/23  Patient will be independent with initial HEP. Baseline:  Goal status: met with walking program 11/05/23  2.  Patient will improve R ankle DF to 20d Baseline: 10d Goal status:  met 12/03/23    LONG TERM GOALS: Target date: 03/23/24  Patient will be independent with advanced/ongoing HEP to improve outcomes and carryover.  Baseline:  Goal status: progressing 01/21/24  2.  Patient will be able stand  SLS on RLE >10s on firm and foam surfaces Baseline: 5s at best on firm  Goal status: progressing 01/21/24, ongoing 02/03/24  3.  Patient will be able to do at least 10 alternating taps on 6 in 20s  Baseline: 6 on 4 in 20s  Goal status: MET 11/19/23   4.  Patient will demonstrate 12 reps or more with 30s chair test Baseline: 9 Goal status: at 12 reps on 01/21/24, Met 02/03/24  5.  Patient will improve ABC confidence score to 75-80%  Baseline: 65%  Goal status: met 01/14/24   ASSESSMENT:  CLINICAL IMPRESSION: Patient is a little unsteady this morning, reports it is too early for her. She stumbles a few times in session today. Single leg stability still needs work. Overall patient continues to improve with her balance, strength, function and overall quality of life. Requested to work on some arm and back stuff today. Still needs work with higher level balance.   EVAL Patient is a 48 y.o. female who was seen today for physical therapy evaluation and treatment for aftercare following brain surgery. Her surgery was 2 years ago but she began having complication recently and seizures. She still has decreased coordination with RLE, and unable to do equal movements with both sides. R ankle DF has returned significantly but still missing about 10d. Pt would like to increase her mobility and ability to do things with her RLE to feel more balance and coordinated. She will benefit from PT to work on her overall strength, balance, and coordination to  improve her confidence out in the community.   OBJECTIVE IMPAIRMENTS: Abnormal gait, decreased balance, decreased coordination, and decreased ROM.   ACTIVITY LIMITATIONS: locomotion level  PARTICIPATION LIMITATIONS: community activity and yard work  PERSONAL FACTORS: Fitness, Past/current experiences, and Time since onset of injury/illness/exacerbation are also affecting patient's functional outcome.   REHAB POTENTIAL: Good  CLINICAL DECISION MAKING: Stable/uncomplicated  EVALUATION COMPLEXITY: Low  PLAN:  PT FREQUENCY: 1x/week  PT DURATION: 12 weeks  PLANNED INTERVENTIONS: 97110-Therapeutic exercises, 97530- Therapeutic activity, [id number]- Neuromuscular re-education, 97535- Self Care, [id number]- Manual therapy, [phone]- Gait training, [phone]- Electrical stimulation (manual), [phone]- Ultrasound, Patient/Family education, Balance training, Stair training, Taping, Dry Needling, Joint mobilization, Spinal mobilization, Cryotherapy, and Moist heat  PLAN FOR NEXT SESSION: push strength, endurance, balance and functional gait, higher level activities for an overall higher quality of life  Ruth, PT 02/12/2024, 9:31 AM

## 2024-02-12 ENCOUNTER — Ambulatory Visit

## 2024-02-12 DIAGNOSIS — R2681 Unsteadiness on feet: Secondary | ICD-10-CM

## 2024-02-12 DIAGNOSIS — R278 Other lack of coordination: Secondary | ICD-10-CM

## 2024-02-12 DIAGNOSIS — M6281 Muscle weakness (generalized): Secondary | ICD-10-CM

## 2024-02-19 NOTE — Therapy (Incomplete)
 OUTPATIENT PHYSICAL THERAPY NEURO TREATMENT   Patient Name: Sheryl Mcmillan MRN: [id number] DOB:[birth year], 48 y.o., female Today's Date: 02/19/2024   PCP: Greig Drones REFERRING PROVIDER: [name]  END OF SESSION:           Past Medical History:  Diagnosis Date   Arthritis    Breast cancer [facility])    Family history of non-Hodgkin's lymphoma 06/01/2020   GERD (gastroesophageal reflux disease)    Hemorrhoids    Migraines    PCOS (polycystic ovarian syndrome)    PONV (postoperative nausea and vomiting)    Seizures (HCC)    Squamous cell carcinoma of skin    Past Surgical History:  Procedure Laterality Date   APPLICATION OF CRANIAL NAVIGATION Left 11/10/2021   Procedure: APPLICATION OF CRANIAL NAVIGATION;  Surgeon: [name], MD;  Location: [facility] OR;  Service: Neurosurgery;  Laterality: Left;   AXILLARY SENTINEL NODE BIOPSY Left 11/07/2020   Procedure: LEFT AXILLARY SENTINEL NODE BIOPSY;  Surgeon: [name], MD;  Location: [facility] OR;  Service: General;  Laterality: Left;   BREAST CYST EXCISION Right 09/22/2021   Procedure: Excision of right axillary excess soft tissue.;  Surgeon: [name], MD;  Location: [facility];  Service: Plastics;  Laterality: Right;   BREAST RECONSTRUCTION WITH PLACEMENT OF TISSUE EXPANDER AND FLEX HD (ACELLULAR HYDRATED DERMIS) Bilateral 11/07/2020   Procedure: BILATERAL BREAST RECONSTRUCTION WITH PLACEMENT OF TISSUE EXPANDER AND FLEX HD (ACELLULAR HYDRATED DERMIS);  Surgeon: [name], MD;  Location: [facility] OR;  Service: Plastics;  Laterality: Bilateral;   COSMETIC SURGERY     CRANIOTOMY Left 11/10/2021   Procedure: LT FRONTAL CRANIOTOMY TUMOR EXCISION;  Surgeon: [name], MD;  Location: [facility] OR;  Service: Neurosurgery;  Laterality: Left;   DILATION AND CURETTAGE OF UTERUS     HEMORRHOID SURGERY     IR IMAGING GUIDED PORT INSERTION  06/15/2020   LIPOSUCTION WITH LIPOFILLING Bilateral 09/22/2021    Procedure: Fat grafting bilateral breast;  Surgeon: [name], MD;  Location: [facility];  Service: Plastics;  Laterality: Bilateral;   MASS EXCISION Bilateral 04/25/2021   Procedure: excision of bilateral axillary breast tissue;  Surgeon: [name], MD;  Location: [facility];  Service: Plastics;  Laterality: Bilateral;   PORT-A-CATH REMOVAL Right 11/07/2020   Procedure: REMOVAL PORT-A-CATH;  Surgeon: [name], MD;  Location: [facility] OR;  Service: General;  Laterality: Right;   REMOVAL OF BILATERAL TISSUE EXPANDERS WITH PLACEMENT OF BILATERAL BREAST IMPLANTS Bilateral 04/25/2021   Procedure: REMOVAL OF BILATERAL TISSUE EXPANDERS WITH PLACEMENT OF BILATERAL BREAST IMPLANTS;  Surgeon: [name], MD;  Location: [facility];  Service: Plastics;  Laterality: Bilateral;   ROBOTIC ASSISTED TOTAL HYSTERECTOMY WITH BILATERAL SALPINGO OOPHERECTOMY Bilateral 03/14/2021   Procedure: XI ROBOTIC ASSISTED TOTAL HYSTERECTOMY WITH BILATERAL SALPINGO OOPHORECTOMY;  Surgeon: [name], MD;  Location: [facility];  Service: Gynecology;  Laterality: Bilateral;   TOTAL MASTECTOMY Bilateral 11/07/2020   Procedure: BILATERAL MASTECTOMY;  Surgeon: [name], MD;  Location: [facility] OR;  Service: General;  Laterality: Bilateral;   Patient Active Problem List   Diagnosis Date Noted   Leukocytosis 04/12/2023   Hyperlipidemia 04/11/2023   Seizure (HCC) 04/11/2023   Brain tumor (HCC) 11/08/2021   Metastatic cancer to brain (HCC) 11/08/2021   Cerebral edema (HCC) 11/08/2021   Complex partial seizure (HCC) 11/08/2021   Brain mass 11/07/2021   Right sided weakness 11/07/2021   IDA (iron deficiency anemia) 07/18/2021   Cholelithiasis  03/14/2021   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   Obesity, Class III, BMI 40-49.9 (morbid obesity) 07/19/2020   BRCA1 gene mutation positive 07/04/2020   Genetic testing 06/28/2020   Family history of  non-Hodgkin's lymphoma 06/01/2020   Malignant neoplasm of upper-outer quadrant of left breast in female, estrogen receptor positive (HCC) 05/26/2020   Acute right ankle pain 05/16/2016   Nondisplaced fracture of lateral malleolus of right fibula, initial encounter for closed fracture 05/16/2016    ONSET DATE: 09/24/23  REFERRING DIAG:  C79.31 (ICD-10-CM) - Secondary malignant neoplasm of brain    THERAPY DIAG:  No diagnosis found.  Rationale for Evaluation and Treatment: Rehabilitation  SUBJECTIVE:                                                                                                                                                                                             SUBJECTIVE STATEMENT: I am good, sleepy but I am good.   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 Last one was in October. I couldn't drive for 6 months. Have to go back next month for scans to check for brain activity and cancer. I feel good and better. The medicine is regulated now.  Pt accompanied by: self  PERTINENT HISTORY: hx of breast cancer and total masectomy, seizures, frontal craniotomy for tumor excision 11/10/21  PAIN:  Are you having pain? No  PRECAUTIONS: Fall  RED FLAGS: None   WEIGHT BEARING RESTRICTIONS: No  FALLS: Has patient fallen in last 6 months? No  LIVING ENVIRONMENT: Lives with: lives with their family Lives in: House/apartment Stairs: Yes: External: 3 steps; none Has following equipment at home: Single point cane  PLOF: Independent  PATIENT GOALS: want to be more aligned, I can feel my gait is off. Want to be more balanced and coordinated   OBJECTIVE:  Note: Objective measures were completed at Evaluation unless otherwise noted.  DIAGNOSTIC FINDINGS: 07/08/23 Brain: Encephalomalacia and a small amount of chronic blood products are again noted at the left frontoparietal resection site. A small amount of nonsuspicious enhancement at the  resection site is unchanged. No new enhancing intracranial lesion is identified. The ventricles are normal in size. No acute infarct, midline shift, or extra-axial fluid collection is evident.  Unchanged left frontoparietal resection site. No evidence of new or progressive intracranial metastatic disease.    COGNITION: Overall cognitive status: Within functional limits for tasks assessed   SENSATION: WFL  COORDINATION: Some decreased motor control on RLE    MUSCLE LENGTH:  Some tightness in R hip, BL hamstrings, and in R ankle  POSTURE: rounded shoulders  LOWER EXTREMITY ROM:   all WFL, except R ankle DF still limited to 10d    LOWER EXTREMITY MMT:  grossly 5/5    FUNCTIONAL TESTS:   9 reps  30 seconds chair stand test Berg Balance Scale: 50/56  PATIENT SURVEYS:  ABC scale 65%                                                                                                                              TREATMENT DATE:  02/20/24 2 laps around back building 4 way ankle 20 reps each SLS on firm and foam 5# flexion 5# abduction Leg press Calf raises on press  Lat pull down  02/12/24 Elliptical L4 x43mins  Cone taps on airex different colors- unable to keep foot up in SL Resisted gait side steps over obstacles Leg ext 15# 2x10 HS curls 35# 2x10  Walking on beam Balance on BOSU, then reaching for numbers  Shoulder ext 15# 2x10 Cable rows 15# 2x10 4# flexion to abd 2x10   02/03/24 Elliptical level 4 x 6 minutes SLS LLE x10 sec, RLE 3 sec 30 second chair stand test 12,  still below her norm for age Seated Rows & Lats 35lb 2x15 Chest Press 20lb 2x12  Triceps Ext 25lb 2x15 6in step ups x10 each  S2S OHP yellow ball 2x10  Shoulder abd & Flex 3lb 2x10  01/29/24 Elliptical level 4 x 6 minutes Seated Rows & Lats 35lb 2x12 Shoulder Ext 10lb 2x10  Rows 15lb 2x10 Triceps Ext 35lb 2x15 Alt 6in box taps x 10  From airex x 10 S2S OHP yellow ball 2x10  Chest Press  15lb 2x12  Shoulder flex & abd 4lb 2x10 Biceps curls 8lb 2x10   01/21/24 30 secondchair stand test 12, improved from 9 but still below her norm for age Elliptical level 4 x 4 minutes Walking ball toss Ball kicks On bosu reaching and reacting Rows 35# Lats 35# Chest press 15# 35# triceps 15# biceps 9# single arm overhead carry 20 # overhead carry both arms  01/14/24 Gait outside around the back building 20# seated row Lats 20# 10# straight arm pull 10# chest press  10# biceps  25# triceps 20# farmer carry 10# overhead single arm carry, then 10# with both arms overhead 5# UE circuit 5# sit to stand with overhead press  01/07/24 Elliptical L3 x17mins  On airex cone taps  Trampoline jumps in and out, scissors, tried SL but unable to push off with RLE Step up to SL x10 each side  Leg curls 35# 2x10, R only x10 Leg extension 10# 2x10, R only x10  12/24/23 Elliptical level 4 x 4 minutes On airex cone toe touches On minitramp bounce, march, tried some hops with feet in and out Then using the pbars for feet in and out with small jump and feet staggered back and forth with small jump Tried some jumping forward and side ways Faster  walk vs slow jog Tried skipping, could not do Walking over obstacles for longer step length Walking ball toss Standing ball kicks Standing cone toe touches with PT telling color and what foot, then same on airex  12/17/23 Leg curls 35# 3x10 Leg extension 10# 3x10 On airex sit to stand with OHP weighted ball Elliptical with SBA Level 4 x 3 minutes On airex ball toss, cone toe touches On bosu ball toss and reaching 50# resisted side stepping Tmill push fwd and backward 25# rows 25# lats  12/10/23 NuStep L5x41mins  2 way hip 10# 2x10 Calf stretch 15s x2 on slant  Box taps 6  HS and ITB stretch with strap  Hip flexor stretch Single knee to chest stretch  12/03/23 Supine LE stretches Red tband ankle exercises Black tband clamshells Ball  b/n knees squeeze Feet on ball K2C, rotation, bridges, isometric abs STM to the upper traps and rhomboids  11/19/23 Treadmill 2%, 2 mph x29mins   Resisted gait 30# 4 way x4  Leg ext 15# 2x12 HS curls 35# 2x12 Small lunge on BOSU Standing on BOSU balance then reaching for numbers  Alternating taps on 6 SLS on firm RLE- 7s at best    11/12/23 Gait outside 2 laps no rest good pace, some SOB Feet on ball K2C, rotation, bridges, isometric abs Passive piriformis stretch Black tband clamshells Bridges 3# on top of right foot DF, then some DF with eversion Standing calf raises Leg press 40# 2x10, then right only 3x 5  Walking ball toss  11/05/23 Nustep level 5 x 6 minutes On airex balance beam side stepping and tandem walking On airex volley ball On bosu reaching Direction changes On mat figure 8's On mat cone toe touches HS curls 35# 2x10, 20# right only 2x10 Leg extension 10# 2x10,  right 5# 2x10 Leg press 40# 2x10, then right only 20# x 10   PATIENT EDUCATION: Education details: POC Person educated: Patient Education method: Explanation Education comprehension: verbalized understanding  HOME EXERCISE PROGRAM: Walking, SLS, doing some exercises with bands  Access Code: [email or web address] URL: [email or web address] Date: 10/22/2023 Prepared by: Almetta Fam  Exercises - Single Leg Stance  - 1 x daily - 7 x weekly - 2 sets - 10 hold - Tandem Stance  - 1 x daily - 7 x weekly - 2 sets - 10 reps - Sit to Stand  - 1 x daily - 7 x weekly - 2 sets - 10 reps - Heel Raises with Counter Support  - 1 x daily - 7 x weekly - 2 sets - 10 reps - Heel Toe Raises with Counter Support  - 1 x daily - 7 x weekly - 2 sets - 10 reps  GOALS: Goals reviewed with patient? Yes  SHORT TERM GOALS: Target date: 11/27/23  Patient will be independent with initial HEP. Baseline:  Goal status: met with walking program 11/05/23  2.  Patient will improve R ankle DF to 20d Baseline: 10d Goal  status:  met 12/03/23    LONG TERM GOALS: Target date: 03/23/24  Patient will be independent with advanced/ongoing HEP to improve outcomes and carryover.  Baseline:  Goal status: progressing 01/21/24  2.  Patient will be able stand SLS on RLE >10s on firm and foam surfaces Baseline: 5s at best on firm  Goal status: progressing 01/21/24, ongoing 02/03/24  3.  Patient will be able to do at least 10 alternating taps on 6 in 20s  Baseline: 6 on 4 in 20s  Goal status: MET 11/19/23   4.  Patient will demonstrate 12 reps or more with 30s chair test Baseline: 9 Goal status: at 12 reps on 01/21/24, Met 02/03/24  5.  Patient will improve ABC confidence score to 75-80%  Baseline: 65%  Goal status: met 01/14/24   ASSESSMENT:  CLINICAL IMPRESSION: Patient is a little unsteady this morning, reports it is too early for her. She stumbles a few times in session today. Single leg stability still needs work. Overall patient continues to improve with her balance, strength, function and overall quality of life. Requested to work on some arm and back stuff today. Still needs work with higher level balance.   EVAL Patient is a 48 y.o. female who was seen today for physical therapy evaluation and treatment for aftercare following brain surgery. Her surgery was 2 years ago but she began having complication recently and seizures. She still has decreased coordination with RLE, and unable to do equal movements with both sides. R ankle DF has returned significantly but still missing about 10d. Pt would like to increase her mobility and ability to do things with her RLE to feel more balance and coordinated. She will benefit from PT to work on her overall strength, balance, and coordination to improve her confidence out in the community.   OBJECTIVE IMPAIRMENTS: Abnormal gait, decreased balance, decreased coordination, and decreased ROM.   ACTIVITY LIMITATIONS: locomotion level  PARTICIPATION LIMITATIONS: community  activity and yard work  PERSONAL FACTORS: Fitness, Past/current experiences, and Time since onset of injury/illness/exacerbation are also affecting patient's functional outcome.   REHAB POTENTIAL: Good  CLINICAL DECISION MAKING: Stable/uncomplicated  EVALUATION COMPLEXITY: Low  PLAN:  PT FREQUENCY: 1x/week  PT DURATION: 12 weeks  PLANNED INTERVENTIONS: 97110-Therapeutic exercises, 97530- Therapeutic activity, [id number]- Neuromuscular re-education, 97535- Self Care, [id number]- Manual therapy, [phone]- Gait training, [phone]- Electrical stimulation (manual), [phone]- Ultrasound, Patient/Family education, Balance training, Stair training, Taping, Dry Needling, Joint mobilization, Spinal mobilization, Cryotherapy, and Moist heat  PLAN FOR NEXT SESSION: push strength, endurance, balance and functional gait, higher level activities for an overall higher quality of life  [place], PT 02/19/2024, 8:14 AM

## 2024-02-20 ENCOUNTER — Ambulatory Visit

## 2024-02-25 NOTE — Therapy (Signed)
 OUTPATIENT PHYSICAL THERAPY NEURO TREATMENT   Patient Name: Sheryl Mcmillan MRN: [id number] DOB:1975-10-07, 48 y.o., female Today's Date: 02/26/2024   PCP: Greig Drones REFERRING PROVIDER: [name]  END OF SESSION:  PT End of Session - 02/26/24 1110     Visit Number 17    Date for PT Re-Evaluation 05/06/24    PT Start Time 1110    PT Stop Time 1145    PT Time Calculation (min) 35 min    Activity Tolerance Patient tolerated treatment well    Behavior During Therapy [facility] for tasks assessed/performed                  Past Medical History:  Diagnosis Date   Arthritis    Breast cancer (HCC)    Family history of non-Hodgkin's lymphoma 06/01/2020   GERD (gastroesophageal reflux disease)    Hemorrhoids    Migraines    PCOS (polycystic ovarian syndrome)    PONV (postoperative nausea and vomiting)    Seizures (HCC)    Squamous cell carcinoma of skin    Past Surgical History:  Procedure Laterality Date   APPLICATION OF CRANIAL NAVIGATION Left 11/10/2021   Procedure: APPLICATION OF CRANIAL NAVIGATION;  Surgeon: [name], MD;  Location: [facility] OR;  Service: Neurosurgery;  Laterality: Left;   AXILLARY SENTINEL NODE BIOPSY Left 11/07/2020   Procedure: LEFT AXILLARY SENTINEL NODE BIOPSY;  Surgeon: [name], MD;  Location: [facility] OR;  Service: General;  Laterality: Left;   BREAST CYST EXCISION Right 09/22/2021   Procedure: Excision of right axillary excess soft tissue.;  Surgeon: [name], MD;  Location: [facility];  Service: Plastics;  Laterality: Right;   BREAST RECONSTRUCTION WITH PLACEMENT OF TISSUE EXPANDER AND FLEX HD (ACELLULAR HYDRATED DERMIS) Bilateral 11/07/2020   Procedure: BILATERAL BREAST RECONSTRUCTION WITH PLACEMENT OF TISSUE EXPANDER AND FLEX HD (ACELLULAR HYDRATED DERMIS);  Surgeon: [name], MD;  Location: [facility] OR;  Service: Plastics;  Laterality: Bilateral;   COSMETIC SURGERY     CRANIOTOMY Left 11/10/2021    Procedure: LT FRONTAL CRANIOTOMY TUMOR EXCISION;  Surgeon: [name], MD;  Location: [facility] OR;  Service: Neurosurgery;  Laterality: Left;   DILATION AND CURETTAGE OF UTERUS     HEMORRHOID SURGERY     IR IMAGING GUIDED PORT INSERTION  06/15/2020   LIPOSUCTION WITH LIPOFILLING Bilateral 09/22/2021   Procedure: Fat grafting bilateral breast;  Surgeon: [name], MD;  Location: [facility];  Service: Plastics;  Laterality: Bilateral;   MASS EXCISION Bilateral 04/25/2021   Procedure: excision of bilateral axillary breast tissue;  Surgeon: [name], MD;  Location: [facility];  Service: Plastics;  Laterality: Bilateral;   PORT-A-CATH REMOVAL Right 11/07/2020   Procedure: REMOVAL PORT-A-CATH;  Surgeon: [name], MD;  Location: [facility] OR;  Service: General;  Laterality: Right;   REMOVAL OF BILATERAL TISSUE EXPANDERS WITH PLACEMENT OF BILATERAL BREAST IMPLANTS Bilateral 04/25/2021   Procedure: REMOVAL OF BILATERAL TISSUE EXPANDERS WITH PLACEMENT OF BILATERAL BREAST IMPLANTS;  Surgeon: [name], MD;  Location: [facility];  Service: Plastics;  Laterality: Bilateral;   ROBOTIC ASSISTED TOTAL HYSTERECTOMY WITH BILATERAL SALPINGO OOPHERECTOMY Bilateral 03/14/2021   Procedure: XI ROBOTIC ASSISTED TOTAL HYSTERECTOMY WITH BILATERAL SALPINGO OOPHORECTOMY;  Surgeon: [name], MD;  Location: [facility];  Service: Gynecology;  Laterality: Bilateral;   TOTAL MASTECTOMY Bilateral 11/07/2020   Procedure: BILATERAL MASTECTOMY;  Surgeon: [name], MD;  Location: [facility] OR;  Service: General;  Laterality: Bilateral;  Patient Active Problem List   Diagnosis Date Noted   Leukocytosis 04/12/2023   Hyperlipidemia 04/11/2023   Seizure (HCC) 04/11/2023   Brain tumor (HCC) 11/08/2021   Metastatic cancer to brain (HCC) 11/08/2021   Cerebral edema (HCC) 11/08/2021   Complex partial seizure (HCC) 11/08/2021   Brain mass 11/07/2021   Right sided  weakness 11/07/2021   IDA (iron deficiency anemia) 07/18/2021   Cholelithiasis 03/14/2021   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   Obesity, Class III, BMI 40-49.9 (morbid obesity) 07/19/2020   BRCA1 gene mutation positive 07/04/2020   Genetic testing 06/28/2020   Family history of non-Hodgkin's lymphoma 06/01/2020   Malignant neoplasm of upper-outer quadrant of left breast in female, estrogen receptor positive (HCC) 05/26/2020   Acute right ankle pain 05/16/2016   Nondisplaced fracture of lateral malleolus of right fibula, initial encounter for closed fracture 05/16/2016    ONSET DATE: 09/24/23  REFERRING DIAG:  C79.31 (ICD-10-CM) - Secondary malignant neoplasm of brain    THERAPY DIAG:  Unsteadiness on feet  Other lack of coordination  Muscle weakness (generalized)  Rationale for Evaluation and Treatment: Rehabilitation  SUBJECTIVE:                                                                                                                                                                                             SUBJECTIVE STATEMENT: I am good.   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 Last one was in October. I couldn't drive for 6 months. Have to go back next month for scans to check for brain activity and cancer. I feel good and better. The medicine is regulated now.  Pt accompanied by: self  PERTINENT HISTORY: hx of breast cancer and total masectomy, seizures, frontal craniotomy for tumor excision 11/10/21  PAIN:  Are you having pain? No  PRECAUTIONS: Fall  RED FLAGS: None   WEIGHT BEARING RESTRICTIONS: No  FALLS: Has patient fallen in last 6 months? No  LIVING ENVIRONMENT: Lives with: lives with their family Lives in: House/apartment Stairs: Yes: External: 3 steps; none Has following equipment at home: Single point cane  PLOF: Independent  PATIENT GOALS: want to be more aligned, I can feel my gait is off.  Want to be more balanced and coordinated   OBJECTIVE:  Note: Objective measures were completed at Evaluation unless otherwise noted.  DIAGNOSTIC FINDINGS: 07/08/23 Brain: Encephalomalacia and a small amount of chronic blood products are again noted at the left frontoparietal resection site. A small amount of nonsuspicious enhancement at the resection site is unchanged. No new enhancing intracranial lesion is identified. The  ventricles are normal in size. No acute infarct, midline shift, or extra-axial fluid collection is evident.  Unchanged left frontoparietal resection site. No evidence of new or progressive intracranial metastatic disease.    COGNITION: Overall cognitive status: Within functional limits for tasks assessed   SENSATION: WFL  COORDINATION: Some decreased motor control on RLE    MUSCLE LENGTH:  Some tightness in R hip, BL hamstrings, and in R ankle   POSTURE: rounded shoulders  LOWER EXTREMITY ROM:   all WFL, except R ankle DF still limited to 10d    LOWER EXTREMITY MMT:  grossly 5/5    FUNCTIONAL TESTS:   9 reps  30 seconds chair stand test Berg Balance Scale: 50/56  PATIENT SURVEYS:  ABC scale 65%                                                                                                                              TREATMENT DATE:  02/26/24 2 laps around back building 4 way ankle 20 reps each Leg press 40# 2x10 Calf raises on press 2x15 Lat pull down 25# 2x10 Seated row 25# 2x10  02/12/24 Elliptical L4 x80mins  [facility] taps on airex different colors- unable to keep foot up in SL Resisted gait side steps over obstacles Leg ext 15# 2x10 HS curls 35# 2x10  Walking on beam Balance on BOSU, then reaching for numbers  Shoulder ext 15# 2x10 Cable rows 15# 2x10 4# flexion to abd 2x10   02/03/24 Elliptical level 4 x 6 minutes SLS LLE x10 sec, RLE 3 sec 30 second chair stand test 12,  still below her norm for age Seated Rows & Lats 35lb  2x15 Chest Press 20lb 2x12  Triceps Ext 25lb 2x15 6in step ups x10 each  S2S OHP yellow ball 2x10  Shoulder abd & Flex 3lb 2x10  01/29/24 Elliptical level 4 x 6 minutes Seated Rows & Lats 35lb 2x12 Shoulder Ext 10lb 2x10  Rows 15lb 2x10 Triceps Ext 35lb 2x15 Alt 6in box taps x 10  From airex x 10 S2S OHP yellow ball 2x10  Chest Press 15lb 2x12  Shoulder flex & abd 4lb 2x10 Biceps curls 8lb 2x10   01/21/24 30 secondchair stand test 12, improved from 9 but still below her norm for age Elliptical level 4 x 4 minutes Walking ball toss Ball kicks On bosu reaching and reacting Rows 35# Lats 35# Chest press 15# 35# triceps 15# biceps 9# single arm overhead carry 20 # overhead carry both arms  01/14/24 Gait outside around the back building 20# seated row Lats 20# 10# straight arm pull 10# chest press  10# biceps  25# triceps 20# farmer carry 10# overhead single arm carry, then 10# with both arms overhead 5# UE circuit 5# sit to stand with overhead press   PATIENT EDUCATION: Education details: POC Person educated: Patient Education method: Explanation Education comprehension: verbalized understanding  HOME EXERCISE PROGRAM: Walking, SLS, doing some exercises with bands  Access Code: [email or web address] URL: [email or web address] Date: 10/22/2023 Prepared by: Almetta Fam  Exercises - Single Leg Stance  - 1 x daily - 7 x weekly - 2 sets - 10 hold - Tandem Stance  - 1 x daily - 7 x weekly - 2 sets - 10 reps - Sit to Stand  - 1 x daily - 7 x weekly - 2 sets - 10 reps - Heel Raises with Counter Support  - 1 x daily - 7 x weekly - 2 sets - 10 reps - Heel Toe Raises with Counter Support  - 1 x daily - 7 x weekly - 2 sets - 10 reps  GOALS: Goals reviewed with patient? Yes  SHORT TERM GOALS: Target date: 11/27/23  Patient will be independent with initial HEP. Baseline:  Goal status: met with walking program 11/05/23  2.  Patient will improve R ankle DF to  20d Baseline: 10d Goal status:  met 12/03/23    LONG TERM GOALS: Target date: 03/23/24  Patient will be independent with advanced/ongoing HEP to improve outcomes and carryover.  Baseline:  Goal status: progressing 01/21/24  2.  Patient will be able stand SLS on RLE >10s on firm and foam surfaces Baseline: 5s at best on firm  Goal status: progressing 01/21/24, ongoing 02/03/24  3.  Patient will be able to do at least 10 alternating taps on 6 in 20s  Baseline: 6 on 4 in 20s  Goal status: MET 11/19/23   4.  Patient will demonstrate 12 reps or more with 30s chair test Baseline: 9 Goal status: at 12 reps on 01/21/24, Met 02/03/24  5.  Patient will improve ABC confidence score to 75-80%  Baseline: 65%  Goal status: met 01/14/24  6. Patient will be able to do a light jog, small hops, and high level balance without LOB and fatigue  Goal status: INITIAL    ASSESSMENT:  CLINICAL IMPRESSION: Patient is doing good overall. She tweaked her ankle last week so could not make it to therapy. Worked on mostly functional strengthening. Some difficulty with DF with band but all other directions were good. Overall patient continues to improve with her balance, strength, function and overall quality of life. Added in a goal for jogging and hopping for CCME purposes. She has 10 visits left for the year.    EVAL Patient is a 48 y.o. female who was seen today for physical therapy evaluation and treatment for aftercare following brain surgery. Her surgery was 2 years ago but she began having complication recently and seizures. She still has decreased coordination with RLE, and unable to do equal movements with both sides. R ankle DF has returned significantly but still missing about 10d. Pt would like to increase her mobility and ability to do things with her RLE to feel more balance and coordinated. She will benefit from PT to work on her overall strength, balance, and coordination to improve her confidence  out in the community.   OBJECTIVE IMPAIRMENTS: Abnormal gait, decreased balance, decreased coordination, and decreased ROM.   ACTIVITY LIMITATIONS: locomotion level  PARTICIPATION LIMITATIONS: community activity and yard work  PERSONAL FACTORS: Fitness, Past/current experiences, and Time since onset of injury/illness/exacerbation are also affecting patient's functional outcome.   REHAB POTENTIAL: Good  CLINICAL DECISION MAKING: Stable/uncomplicated  EVALUATION COMPLEXITY: Low  PLAN:  PT FREQUENCY: 1x/week  PT DURATION: 12 weeks  PLANNED INTERVENTIONS: 97110-Therapeutic exercises, 97530- Therapeutic activity, [id number]- Neuromuscular re-education, 97535- Self Care, [id number]- Manual therapy, [id number]- Gait training, [phone]- Electrical stimulation (  manual), [id number]- Ultrasound, Patient/Family education, Balance training, Stair training, Taping, Dry Needling, Joint mobilization, Spinal mobilization, Cryotherapy, and Moist heat  PLAN FOR NEXT SESSION: push strength, endurance, balance and functional gait, higher level activities for an overall higher quality of life  [place], PT 02/26/2024, 2:10 PM

## 2024-02-26 ENCOUNTER — Ambulatory Visit: Attending: Neurosurgery

## 2024-02-26 ENCOUNTER — Encounter: Payer: Self-pay | Admitting: *Deleted

## 2024-02-26 ENCOUNTER — Encounter: Payer: Self-pay | Admitting: Hematology and Oncology

## 2024-02-26 DIAGNOSIS — M6281 Muscle weakness (generalized): Secondary | ICD-10-CM | POA: Insufficient documentation

## 2024-02-26 DIAGNOSIS — R262 Difficulty in walking, not elsewhere classified: Secondary | ICD-10-CM | POA: Insufficient documentation

## 2024-02-26 DIAGNOSIS — R2681 Unsteadiness on feet: Secondary | ICD-10-CM | POA: Diagnosis present

## 2024-02-26 DIAGNOSIS — Z483 Aftercare following surgery for neoplasm: Secondary | ICD-10-CM | POA: Diagnosis present

## 2024-02-26 DIAGNOSIS — R278 Other lack of coordination: Secondary | ICD-10-CM | POA: Insufficient documentation

## 2024-02-26 NOTE — Progress Notes (Signed)
 RN received [place] Medicaid Request for PA on incontinence supplies.  MD signed form and RN successfully faxed back to [facility] [phone].

## 2024-02-27 ENCOUNTER — Encounter: Payer: Self-pay | Admitting: *Deleted

## 2024-03-02 ENCOUNTER — Ambulatory Visit: Admitting: Physical Therapy

## 2024-03-02 ENCOUNTER — Encounter: Payer: Self-pay | Admitting: Physical Therapy

## 2024-03-02 DIAGNOSIS — Z483 Aftercare following surgery for neoplasm: Secondary | ICD-10-CM

## 2024-03-02 DIAGNOSIS — R278 Other lack of coordination: Secondary | ICD-10-CM

## 2024-03-02 DIAGNOSIS — M6281 Muscle weakness (generalized): Secondary | ICD-10-CM

## 2024-03-02 DIAGNOSIS — R2681 Unsteadiness on feet: Secondary | ICD-10-CM

## 2024-03-02 DIAGNOSIS — R262 Difficulty in walking, not elsewhere classified: Secondary | ICD-10-CM

## 2024-03-02 NOTE — Therapy (Signed)
 OUTPATIENT PHYSICAL THERAPY NEURO TREATMENT   Patient Name: Sheryl Mcmillan MRN: [id number] DOB:Mar 25, 1976, 48 y.o., female Today's Date: 03/02/2024   PCP: Greig Drones REFERRING PROVIDER: [name]  END OF SESSION:  PT End of Session - 03/02/24 1314     Visit Number 18    Number of Visits 27    Date for PT Re-Evaluation 05/06/24    Authorization Type Medicaid    PT Start Time 1313    PT Stop Time 1358    PT Time Calculation (min) 45 min    Activity Tolerance Patient tolerated treatment well    Behavior During Therapy WFL for tasks assessed/performed                  Past Medical History:  Diagnosis Date   Arthritis    Breast cancer (HCC)    Family history of non-Hodgkin's lymphoma 06/01/2020   GERD (gastroesophageal reflux disease)    Hemorrhoids    Migraines    PCOS (polycystic ovarian syndrome)    PONV (postoperative nausea and vomiting)    Seizures (HCC)    Squamous cell carcinoma of skin    Past Surgical History:  Procedure Laterality Date   APPLICATION OF CRANIAL NAVIGATION Left 11/10/2021   Procedure: APPLICATION OF CRANIAL NAVIGATION;  Surgeon: [name], MD;  Location: [facility] OR;  Service: Neurosurgery;  Laterality: Left;   AXILLARY SENTINEL NODE BIOPSY Left 11/07/2020   Procedure: LEFT AXILLARY SENTINEL NODE BIOPSY;  Surgeon: [name], MD;  Location: [facility] OR;  Service: General;  Laterality: Left;   BREAST CYST EXCISION Right 09/22/2021   Procedure: Excision of right axillary excess soft tissue.;  Surgeon: [name], MD;  Location: [facility];  Service: Plastics;  Laterality: Right;   BREAST RECONSTRUCTION WITH PLACEMENT OF TISSUE EXPANDER AND FLEX HD (ACELLULAR HYDRATED DERMIS) Bilateral 11/07/2020   Procedure: BILATERAL BREAST RECONSTRUCTION WITH PLACEMENT OF TISSUE EXPANDER AND FLEX HD (ACELLULAR HYDRATED DERMIS);  Surgeon: [name], MD;  Location: [facility] OR;  Service: Plastics;  Laterality: Bilateral;    COSMETIC SURGERY     CRANIOTOMY Left 11/10/2021   Procedure: LT FRONTAL CRANIOTOMY TUMOR EXCISION;  Surgeon: [name], MD;  Location: [facility] OR;  Service: Neurosurgery;  Laterality: Left;   DILATION AND CURETTAGE OF UTERUS     HEMORRHOID SURGERY     IR IMAGING GUIDED PORT INSERTION  06/15/2020   LIPOSUCTION WITH LIPOFILLING Bilateral 09/22/2021   Procedure: Fat grafting bilateral breast;  Surgeon: [name], MD;  Location: [facility];  Service: Plastics;  Laterality: Bilateral;   MASS EXCISION Bilateral 04/25/2021   Procedure: excision of bilateral axillary breast tissue;  Surgeon: [name], MD;  Location: [facility];  Service: Plastics;  Laterality: Bilateral;   PORT-A-CATH REMOVAL Right 11/07/2020   Procedure: REMOVAL PORT-A-CATH;  Surgeon: [name], MD;  Location: [facility] OR;  Service: General;  Laterality: Right;   REMOVAL OF BILATERAL TISSUE EXPANDERS WITH PLACEMENT OF BILATERAL BREAST IMPLANTS Bilateral 04/25/2021   Procedure: REMOVAL OF BILATERAL TISSUE EXPANDERS WITH PLACEMENT OF BILATERAL BREAST IMPLANTS;  Surgeon: [name], MD;  Location: [facility];  Service: Plastics;  Laterality: Bilateral;   ROBOTIC ASSISTED TOTAL HYSTERECTOMY WITH BILATERAL SALPINGO OOPHERECTOMY Bilateral 03/14/2021   Procedure: XI ROBOTIC ASSISTED TOTAL HYSTERECTOMY WITH BILATERAL SALPINGO OOPHORECTOMY;  Surgeon: [name], MD;  Location: [facility];  Service: Gynecology;  Laterality: Bilateral;   TOTAL MASTECTOMY Bilateral 11/07/2020   Procedure: BILATERAL MASTECTOMY;  Surgeon:  [name], MD;  Location: [facility] OR;  Service: General;  Laterality: Bilateral;   Patient Active Problem List   Diagnosis Date Noted   Leukocytosis 04/12/2023   Hyperlipidemia 04/11/2023   Seizure (HCC) 04/11/2023   Brain tumor (HCC) 11/08/2021   Metastatic cancer to brain (HCC) 11/08/2021   Cerebral edema (HCC) 11/08/2021   Complex partial seizure (HCC)  11/08/2021   Brain mass 11/07/2021   Right sided weakness 11/07/2021   IDA (iron deficiency anemia) 07/18/2021   Cholelithiasis 03/14/2021   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   Obesity, Class III, BMI 40-49.9 (morbid obesity) 07/19/2020   BRCA1 gene mutation positive 07/04/2020   Genetic testing 06/28/2020   Family history of non-Hodgkin's lymphoma 06/01/2020   Malignant neoplasm of upper-outer quadrant of left breast in female, estrogen receptor positive (HCC) 05/26/2020   Acute right ankle pain 05/16/2016   Nondisplaced fracture of lateral malleolus of right fibula, initial encounter for closed fracture 05/16/2016    ONSET DATE: 09/24/23  REFERRING DIAG:  C79.31 (ICD-10-CM) - Secondary malignant neoplasm of brain    THERAPY DIAG:  Unsteadiness on feet  Other lack of coordination  Muscle weakness (generalized)  Difficulty in walking, not elsewhere classified  Aftercare following surgery for neoplasm  Rationale for Evaluation and Treatment: Rehabilitation  SUBJECTIVE:                                                                                                                                                                                             SUBJECTIVE STATEMENT: I am good. 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 Last one was in October. I couldn't drive for 6 months. Have to go back next month for scans to check for brain activity and cancer. I feel good and better. The medicine is regulated now.  Pt accompanied by: self  PERTINENT HISTORY: hx of breast cancer and total masectomy, seizures, frontal craniotomy for tumor excision 11/10/21  PAIN:  Are you having pain? No  PRECAUTIONS: Fall  RED FLAGS: None   WEIGHT BEARING RESTRICTIONS: No  FALLS: Has patient fallen in last 6 months? No  LIVING ENVIRONMENT: Lives with: lives with their family Lives in: House/apartment Stairs:  Yes: External: 3 steps; none Has following equipment at home: Single point cane  PLOF: Independent  PATIENT GOALS: want to be more aligned, I can feel my gait is off. Want to be more balanced and coordinated   OBJECTIVE:  Note: Objective measures were completed at Evaluation unless otherwise noted.  DIAGNOSTIC FINDINGS: 07/08/23 Brain: Encephalomalacia and a small amount of  chronic blood products are again noted at the left frontoparietal resection site. A small amount of nonsuspicious enhancement at the resection site is unchanged. No new enhancing intracranial lesion is identified. The ventricles are normal in size. No acute infarct, midline shift, or extra-axial fluid collection is evident.  Unchanged left frontoparietal resection site. No evidence of new or progressive intracranial metastatic disease.    COGNITION: Overall cognitive status: Within functional limits for tasks assessed   SENSATION: WFL  COORDINATION: Some decreased motor control on RLE    MUSCLE LENGTH:  Some tightness in R hip, BL hamstrings, and in R ankle   POSTURE: rounded shoulders  LOWER EXTREMITY ROM:   all WFL, except R ankle DF still limited to 10d    LOWER EXTREMITY MMT:  grossly 5/5    FUNCTIONAL TESTS:   9 reps  30 seconds chair stand test Berg Balance Scale: 50/56  PATIENT SURVEYS:  ABC scale 65%                                                                                                                              TREATMENT DATE:  03/02/24 Gait outside 3 laps nice pace no rest 35# rows 35# lats Black tband back extension 15# straight arm pulls 60# leg press On airex head turns On airex reaching  02/26/24 2 laps around back building 4 way ankle 20 reps each Leg press 40# 2x10 Calf raises on press 2x15 Lat pull down 25# 2x10 Seated row 25# 2x10  02/12/24 Elliptical L4 x82mins  [facility] taps on airex different colors- unable to keep foot up in SL Resisted gait side steps over  obstacles Leg ext 15# 2x10 HS curls 35# 2x10  Walking on beam Balance on BOSU, then reaching for numbers  Shoulder ext 15# 2x10 Cable rows 15# 2x10 4# flexion to abd 2x10   02/03/24 Elliptical level 4 x 6 minutes SLS LLE x10 sec, RLE 3 sec 30 second chair stand test 12,  still below her norm for age Seated Rows & Lats 35lb 2x15 Chest Press 20lb 2x12  Triceps Ext 25lb 2x15 6in step ups x10 each  S2S OHP yellow ball 2x10  Shoulder abd & Flex 3lb 2x10  01/29/24 Elliptical level 4 x 6 minutes Seated Rows & Lats 35lb 2x12 Shoulder Ext 10lb 2x10  Rows 15lb 2x10 Triceps Ext 35lb 2x15 Alt 6in box taps x 10  From airex x 10 S2S OHP yellow ball 2x10  Chest Press 15lb 2x12  Shoulder flex & abd 4lb 2x10 Biceps curls 8lb 2x10   01/21/24 30 secondchair stand test 12, improved from 9 but still below her norm for age Elliptical level 4 x 4 minutes Walking ball toss Ball kicks On bosu reaching and reacting Rows 35# Lats 35# Chest press 15# 35# triceps 15# biceps 9# single arm overhead carry 20 # overhead carry both arms  01/14/24 Gait outside around the back building 20# seated row Lats 20# 10#  straight arm pull 10# chest press  10# biceps  25# triceps 20# farmer carry 10# overhead single arm carry, then 10# with both arms overhead 5# UE circuit 5# sit to stand with overhead press   PATIENT EDUCATION: Education details: POC Person educated: Patient Education method: Explanation Education comprehension: verbalized understanding  HOME EXERCISE PROGRAM: Walking, SLS, doing some exercises with bands  Access Code: [email or web address] URL: [email or web address] Date: 10/22/2023 Prepared by: Almetta Fam  Exercises - Single Leg Stance  - 1 x daily - 7 x weekly - 2 sets - 10 hold - Tandem Stance  - 1 x daily - 7 x weekly - 2 sets - 10 reps - Sit to Stand  - 1 x daily - 7 x weekly - 2 sets - 10 reps - Heel Raises with Counter Support  - 1 x daily - 7 x weekly - 2  sets - 10 reps - Heel Toe Raises with Counter Support  - 1 x daily - 7 x weekly - 2 sets - 10 reps  GOALS: Goals reviewed with patient? Yes  SHORT TERM GOALS: Target date: 11/27/23  Patient will be independent with initial HEP. Baseline:  Goal status: met with walking program 11/05/23  2.  Patient will improve R ankle DF to 20d Baseline: 10d Goal status:  met 12/03/23    LONG TERM GOALS: Target date: 03/23/24  Patient will be independent with advanced/ongoing HEP to improve outcomes and carryover.  Baseline:  Goal status: progressing 01/21/24  2.  Patient will be able stand SLS on RLE >10s on firm and foam surfaces Baseline: 5s at best on firm  Goal status: progressing 01/21/24, ongoing 02/03/24  3.  Patient will be able to do at least 10 alternating taps on 6 in 20s  Baseline: 6 on 4 in 20s  Goal status: MET 11/19/23   4.  Patient will demonstrate 12 reps or more with 30s chair test Baseline: 9 Goal status: at 12 reps on 01/21/24, Met 02/03/24  5.  Patient will improve ABC confidence score to 75-80%  Baseline: 65%  Goal status: met 01/14/24  6. Patient will be able to do a light jog, small hops, and high level balance without LOB and fatigue  Goal status: ongoing 03/02/24   ASSESSMENT:  CLINICAL IMPRESSION: Patient is doing good overall. We did 3 laps with a good pace and no rest, on the last lap did have a little wobble in the knees at times, but never had tow drag or a significant issue with a LOB.  At the end of this we did walk through the grass to the front door with CGA due to the uneven terrain.    EVAL Patient is a 48 y.o. female who was seen today for physical therapy evaluation and treatment for aftercare following brain surgery. Her surgery was 2 years ago but she began having complication recently and seizures. She still has decreased coordination with RLE, and unable to do equal movements with both sides. R ankle DF has returned significantly but still missing  about 10d. Pt would like to increase her mobility and ability to do things with her RLE to feel more balance and coordinated. She will benefit from PT to work on her overall strength, balance, and coordination to improve her confidence out in the community.   OBJECTIVE IMPAIRMENTS: Abnormal gait, decreased balance, decreased coordination, and decreased ROM.   ACTIVITY LIMITATIONS: locomotion level  PARTICIPATION LIMITATIONS: community activity and yard work  PERSONAL FACTORS: Fitness, Past/current  experiences, and Time since onset of injury/illness/exacerbation are also affecting patient's functional outcome.   REHAB POTENTIAL: Good  CLINICAL DECISION MAKING: Stable/uncomplicated  EVALUATION COMPLEXITY: Low  PLAN:  PT FREQUENCY: 1x/week  PT DURATION: 12 weeks  PLANNED INTERVENTIONS: 97110-Therapeutic exercises, 97530- Therapeutic activity, [id number]- Neuromuscular re-education, 97535- Self Care, [id number]- Manual therapy, [phone]- Gait training, [phone]- Electrical stimulation (manual), [phone]- Ultrasound, Patient/Family education, Balance training, Stair training, Taping, Dry Needling, Joint mobilization, Spinal mobilization, Cryotherapy, and Moist heat  PLAN FOR NEXT SESSION: push strength, endurance, balance and functional gait, higher level activities for an overall higher quality of life She will go on a trip later next week so , would not push too hard at the next visit  Jaiquan Temme W, PT 03/02/2024, 1:15 PM

## 2024-03-09 ENCOUNTER — Ambulatory Visit

## 2024-03-10 ENCOUNTER — Inpatient Hospital Stay: Attending: Hematology and Oncology | Admitting: Hematology and Oncology

## 2024-03-10 VITALS — BP 129/86 | HR 77 | Temp 98.2°F | Resp 17 | Wt 297.5 lb

## 2024-03-10 DIAGNOSIS — E669 Obesity, unspecified: Secondary | ICD-10-CM | POA: Insufficient documentation

## 2024-03-10 DIAGNOSIS — Z9079 Acquired absence of other genital organ(s): Secondary | ICD-10-CM | POA: Diagnosis not present

## 2024-03-10 DIAGNOSIS — K760 Fatty (change of) liver, not elsewhere classified: Secondary | ICD-10-CM | POA: Diagnosis not present

## 2024-03-10 DIAGNOSIS — Z170 Estrogen receptor positive status [ER+]: Secondary | ICD-10-CM | POA: Insufficient documentation

## 2024-03-10 DIAGNOSIS — Z90722 Acquired absence of ovaries, bilateral: Secondary | ICD-10-CM | POA: Diagnosis not present

## 2024-03-10 DIAGNOSIS — C50412 Malignant neoplasm of upper-outer quadrant of left female breast: Secondary | ICD-10-CM | POA: Insufficient documentation

## 2024-03-10 DIAGNOSIS — C7931 Secondary malignant neoplasm of brain: Secondary | ICD-10-CM | POA: Diagnosis not present

## 2024-03-10 DIAGNOSIS — G40909 Epilepsy, unspecified, not intractable, without status epilepticus: Secondary | ICD-10-CM | POA: Insufficient documentation

## 2024-03-10 DIAGNOSIS — Z79899 Other long term (current) drug therapy: Secondary | ICD-10-CM | POA: Insufficient documentation

## 2024-03-10 DIAGNOSIS — Z9013 Acquired absence of bilateral breasts and nipples: Secondary | ICD-10-CM | POA: Diagnosis not present

## 2024-03-10 NOTE — Assessment & Plan Note (Signed)
 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 CT CAP 05/18/22: mild non specific inc in para tracheal LN Nonspecific, stable 5 mm LUL nodule Bone scan: 05/18/22: L2 compression fracture, no bone mets CT chest done 08/22/2022: No evidence of metastatic disease, fatty liver Brain MRI 06/29/2022: New 2 mm metastasis right frontal lobe (SRS on 07/10/2022) 02/28/2023: CT CAP: No evidence of metastatic disease in CAP 11/11/2023: CT CAP: Stable prominent mediastinal lymph nodes, stable 4 mm lung nodule left, fatty liver   Brain MRI: Scheduled for 11/27/2023 Patient follows with Dr. Buckley   I reviewed the results of the CT scans which do not show any active metastatic disease. Plan to perform CT scans in 6 months again.  (These have not been done) I wrote a letter for her to get her tuition deferred because of her current health situation.   We will plan to do scans every 6 months.

## 2024-03-10 NOTE — Progress Notes (Signed)
 Patient Care Team: Patient, No Pcp Per as PCP - General (General Practice) [name], MD as Consulting Physician (General Surgery) Shannon Agent, MD as Consulting Physician (Radiation Oncology) [name], MD as Consulting Physician (Plastic Surgery) Odean Potts, MD as Consulting Physician (Hematology and Oncology) Lanis Pupa, MD as Consulting Physician (Neurosurgery)  DIAGNOSIS:  Encounter Diagnosis  Name Primary?   Malignant neoplasm of upper-outer quadrant of left breast in female, estrogen receptor positive (HCC) Yes    SUMMARY OF ONCOLOGIC HISTORY: Oncology History  Malignant neoplasm of upper-outer quadrant of left breast in female, estrogen receptor positive (HCC)  05/26/2020 Initial Diagnosis   Malignant neoplasm of upper-outer quadrant of left breast in female, estrogen receptor positive (HCC)   06/01/2020 Cancer Staging   Staging form: Breast, AJCC 8th Edition - Clinical stage from 06/01/2020: Stage IIA (cT2, cN0, cM0, G3, ER+, PR-, HER2+) - Signed by Loretha Ash, MD on 11/21/2022 Stage prefix: Initial diagnosis Histologic grading system: 3 grade system   06/16/2020 - 09/30/2020 Chemotherapy      Patient is on Antibody Plan: BREAST TRASTUZUMAB  Q21D    06/16/2020 Genetic Testing   Positive genetic testing: 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  Variant of uncertain significance in MSH3 at c.2724A>G (Silent).  No other pathogenic or uncertain variants were reported in the [facility] Multi-Cancer Panel.  The report date is June 16, 2020.    The variant of uncertain significance (VUS) in MSH3 at c.2724A>G (Silent) has been reclassified to likely benign.  The change in variant classification was made as a result of re-review of evidence in light of new variant interpretation guidelines and/or new information. The amended report date is January 10, 2021.   The Multi-Cancer Panel offered by [facility] includes sequencing and/or  deletion duplication testing of the following 85 genes: AIP, ALK, APC, ATM, AXIN2,BAP1,  BARD1, BLM, BMPR1A, BRCA1, BRCA2, BRIP1, CASR, CDC73, CDH1, CDK4, CDKN1B, CDKN1C, CDKN2A (p14ARF), CDKN2A (p16INK4a), CEBPA, CHEK2, CTNNA1, DICER1, DIS3L2, EGFR (c.2369C>T, p.Thr790Met variant only), EPCAM (Deletion/duplication testing only), FH, FLCN, GATA2, GPC3, GREM1 (Promoter region deletion/duplication testing only), HOXB13 (c.251G>A, p.Gly84Glu), HRAS, KIT, MAX, MEN1, MET, MITF (c.952G>A, p.Glu318Lys variant only), MLH1, MSH2, MSH3, MSH6, MUTYH, NBN, NF1, NF2, NTHL1, PALB2, PDGFRA, PHOX2B, PMS2, POLD1, POLE, POT1, PRKAR1A, PTCH1, PTEN, RAD50, RAD51C, RAD51D, RB1, RECQL4, RET, RNF43, RUNX1, SDHAF2, SDHA (sequence changes only), SDHB, SDHC, SDHD, SMAD4, SMARCA4, SMARCB1, SMARCE1, STK11, SUFU, TERC, TERT, TMEM127, TP53, TSC1, TSC2, VHL, WRN and WT1.    10/20/2020 - 10/20/2020 Chemotherapy    Patient is on Treatment Plan: BREAST  DOCETAXEL  + CARBOPLATIN  + TRASTUZUMAB  + PERTUZUMAB   (TCHP) Q21D       10/20/2020 - 06/23/2021 Chemotherapy   Patient is on Treatment Plan : BREAST Trastuzumab  q21d     11/10/2021 - 11/10/2021 Radiation Therapy   Site Technique Total Dose (Gy) Dose per Fx (Gy) Completed Fx Beam Energies  Brain: Brain [id number]_Superior_1mm IMRT 18/18 18 1/1 6XFFF       CHIEF COMPLIANT: Follow-up of metastatic breast cancer on Verzinio  HISTORY OF PRESENT ILLNESS:   History of Present Illness Sheryl Mcmillan is a 48 year old female who presents for a routine visit on Verzenio and letrozole for metastatic breast cancer.  She is also here to have a follow-up appointment so that she can maintain eligibility for personal care services.  She is currently taking Keppra , which may cause mood changes, and lorazepam  for scans. She has stopped using a topical chemo cream as per her previous treatment plan.  She inquires about using berberine for inflammation and mood stabilization.     ALLERGIES:  is allergic  to carboplatin , other, and wound dressing adhesive.  MEDICATIONS:  Current Outpatient Medications  Medication Sig Dispense Refill   levETIRAcetam  (KEPPRA ) 1000 MG tablet TAKE 1 TABLET(1000 MG) BY MOUTH TWICE DAILY 60 tablet 5   LORazepam  (ATIVAN ) 1 MG tablet Take one po 30 min prior to mri or radiation 7 tablet 0   Magnesium  400 MG CAPS Take 400 mg by mouth daily.     triamcinolone  ointment (KENALOG ) 0.1 % Apply 1 Application topically daily as needed. 453 g 2   No current facility-administered medications for this visit.    PHYSICAL EXAMINATION: ECOG PERFORMANCE STATUS: 1 - Symptomatic but completely ambulatory  Vitals:   03/10/24 1413  BP: 129/86  Pulse: 77  Resp: 17  Temp: 98.2 F (36.8 C)  SpO2: 97%   Filed Weights   03/10/24 1413  Weight: 297 lb 8 oz (134.9 kg)    Physical Exam   (exam performed in the presence of a chaperone)  LABORATORY DATA:  I have reviewed the data as listed    Latest Ref Rng & Units 11/11/2023    9:22 AM 04/24/2023    9:03 AM 04/11/2023    3:09 PM  CMP  Glucose 70 - 99 mg/dL 856  867  872   BUN 6 - 20 mg/dL 11  8  9    Creatinine 0.44 - 1.00 mg/dL 9.35  9.25  9.19   Sodium 135 - 145 mmol/L 142  140  138   Potassium 3.5 - 5.1 mmol/L 4.0  4.3  4.2   Chloride 98 - 111 mmol/L 111  109  100   CO2 22 - 32 mmol/L 23  21  24    Calcium  8.9 - 10.3 mg/dL 8.7  8.8  9.1   Total Protein 6.5 - 8.1 g/dL 6.6     Total Bilirubin 0.0 - 1.2 mg/dL 0.3     Alkaline Phos 38 - 126 U/L 93     AST 15 - 41 U/L 12     ALT 0 - 44 U/L 20       Lab Results  Component Value Date   WBC 9.2 11/11/2023   HGB 13.5 11/11/2023   HCT 40.5 11/11/2023   MCV 83.7 11/11/2023   PLT 287 11/11/2023   NEUTROABS 4.6 11/11/2023    ASSESSMENT & PLAN:  Malignant neoplasm of upper-outer quadrant of left breast in female, estrogen receptor positive (HCC) 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, PR negative, Ki-67 50%, HER2 positive, genetics: BRCA1  mutation 06/16/2020: Neoadjuvant chemotherapy with TCH Perjeta  but Perjeta  was discontinued after cycle 1, Herceptin  maintenance completed December 2022 11/07/2020: Bilateral mastectomies: Residual grade 3 IDC left breast negative margins, triple negative with a Ki-67 of 15% 03/14/2021: Bilateral salpingo-oophorectomy   Hospitalization 11/17/2021-12/09/2021 partial seizures, brain MRI left frontal metastases resection of the tumor metastatic breast cancer HER2 positive: Right lower extremity weakness CT CAP 11/12/2021: No distant metastatic disease. CT CAP 05/18/22: mild non specific inc in para tracheal LN Nonspecific, stable 5 mm LUL nodule Bone scan: 05/18/22: L2 compression fracture, no bone mets CT chest done 08/22/2022: No evidence of metastatic disease, fatty liver Brain MRI 06/29/2022: New 2 mm metastasis right frontal lobe (SRS on 07/10/2022) 02/28/2023: CT CAP: No evidence of metastatic disease in CAP 11/11/2023: CT CAP: Stable prominent mediastinal lymph nodes, stable 4 mm lung nodule left, fatty liver  Brain MRI:   11/27/2023: Stable examination.  Previous resection of left frontal parietal vertex with residual gliosis Patient follows with Dr. Buckley   We will plan to do scans every 6 months. ------------------------------------- Assessment and Plan Assessment & Plan Malignant neoplasm of upper-outer quadrant of left breast, estrogen receptor positive Condition well-managed, no acute issues. - Schedule CT scan in November.  Seizure disorder On levetiracetam  with mood changes noted as 'Keppra  rage'.  Fatty liver Discussed berberine for inflammation and potential benefits. Evaluated interaction with levetiracetam .  Obesity Lost 6 pounds since June, now 303 pounds. Managing weight with juicing, green smoothies, low-carb diet, and intermittent fasting.      No orders of the defined types were placed in this encounter.  The patient has a good understanding of the overall plan. she  agrees with it. she will call with any problems that may develop before the next visit here. Total time spent: 30 mins including face to face time and time spent for planning, charting and co-ordination of care   Naomi MARLA Chad, MD 03/10/24

## 2024-03-11 ENCOUNTER — Encounter: Payer: Self-pay | Admitting: *Deleted

## 2024-03-11 NOTE — Progress Notes (Signed)
 Pt dropped off Request for independent assessment for personal care services attestation of medical need form.  MD filled form out and RN successfully faxed to number provided by pt [phone]).

## 2024-03-19 ENCOUNTER — Telehealth: Payer: Self-pay

## 2024-03-19 NOTE — Telephone Encounter (Signed)
 Emmie from [employer] program called as she has been trying to set up in home assessment for a referral that was sent for personal assistance in the home.  Called back Emmie but had to leave a message. Left her the phone number for [facility] and also for Philomina's daughter Jazmine as they have been having a hard time reaching her. Will also call Assunta to let her know they are trying to reach her. Andrea CHRISTELLA Plunk, RN

## 2024-03-24 NOTE — Therapy (Incomplete)
 OUTPATIENT PHYSICAL THERAPY NEURO TREATMENT   Patient Name: Sheryl Mcmillan MRN: [id number] DOB:09/25/1975, 48 y.o., female Today's Date: 03/24/2024   PCP: Greig Drones REFERRING PROVIDER: [name]  END OF SESSION:            Past Medical History:  Diagnosis Date   Arthritis    Breast cancer [facility])    Family history of non-Hodgkin's lymphoma 06/01/2020   GERD (gastroesophageal reflux disease)    Hemorrhoids    Migraines    PCOS (polycystic ovarian syndrome)    PONV (postoperative nausea and vomiting)    Seizures (HCC)    Squamous cell carcinoma of skin    Past Surgical History:  Procedure Laterality Date   APPLICATION OF CRANIAL NAVIGATION Left 11/10/2021   Procedure: APPLICATION OF CRANIAL NAVIGATION;  Surgeon: [name], MD;  Location: [facility] OR;  Service: Neurosurgery;  Laterality: Left;   AXILLARY SENTINEL NODE BIOPSY Left 11/07/2020   Procedure: LEFT AXILLARY SENTINEL NODE BIOPSY;  Surgeon: [name], MD;  Location[facility] OR;  Service: General;  Laterality: Left;   BREAST CYST EXCISION Right 09/22/2021   Procedure: Excision of right axillary excess soft tissue.;  Surgeon: [name], MD;  Location: [facility];  Service: Plastics;  Laterality: Right;   BREAST RECONSTRUCTION WITH PLACEMENT OF TISSUE EXPANDER AND FLEX HD (ACELLULAR HYDRATED DERMIS) Bilateral 11/07/2020   Procedure: BILATERAL BREAST RECONSTRUCTION WITH PLACEMENT OF TISSUE EXPANDER AND FLEX HD (ACELLULAR HYDRATED DERMIS);  Surgeon: [name], MD;  Location: [facility] OR;  Service: Plastics;  Laterality: Bilateral;   COSMETIC SURGERY     CRANIOTOMY Left 11/10/2021   Procedure: LT FRONTAL CRANIOTOMY TUMOR EXCISION;  Surgeon: [name], MD;  Location: [facility] OR;  Service: Neurosurgery;  Laterality: Left;   DILATION AND CURETTAGE OF UTERUS     HEMORRHOID SURGERY     IR IMAGING GUIDED PORT INSERTION  06/15/2020   LIPOSUCTION WITH LIPOFILLING Bilateral 09/22/2021    Procedure: Fat grafting bilateral breast;  Surgeon: [name], MD;  Location: [facility];  Service: Plastics;  Laterality: Bilateral;   MASS EXCISION Bilateral 04/25/2021   Procedure: excision of bilateral axillary breast tissue;  Surgeon: [name], MD;  Location: [facility];  Service: Plastics;  Laterality: Bilateral;   PORT-A-CATH REMOVAL Right 11/07/2020   Procedure: REMOVAL PORT-A-CATH;  Surgeon: [name], MD;  Location: [facility] OR;  Service: General;  Laterality: Right;   REMOVAL OF BILATERAL TISSUE EXPANDERS WITH PLACEMENT OF BILATERAL BREAST IMPLANTS Bilateral 04/25/2021   Procedure: REMOVAL OF BILATERAL TISSUE EXPANDERS WITH PLACEMENT OF BILATERAL BREAST IMPLANTS;  Surgeon: [name], MD;  Location: [facility];  Service: Plastics;  Laterality: Bilateral;   ROBOTIC ASSISTED TOTAL HYSTERECTOMY WITH BILATERAL SALPINGO OOPHERECTOMY Bilateral 03/14/2021   Procedure: XI ROBOTIC ASSISTED TOTAL HYSTERECTOMY WITH BILATERAL SALPINGO OOPHORECTOMY;  Surgeon: [name], MD;  Location: [facility];  Service: Gynecology;  Laterality: Bilateral;   TOTAL MASTECTOMY Bilateral 11/07/2020   Procedure: BILATERAL MASTECTOMY;  Surgeon: [name], MD;  Location: [facility] OR;  Service: General;  Laterality: Bilateral;   Patient Active Problem List   Diagnosis Date Noted   Leukocytosis 04/12/2023   Hyperlipidemia 04/11/2023   Seizure (HCC) 04/11/2023   Brain tumor (HCC) 11/08/2021   Metastatic cancer to brain (HCC) 11/08/2021   Cerebral edema (HCC) 11/08/2021   Complex partial seizure (HCC) 11/08/2021   Brain mass 11/07/2021   Right sided weakness 11/07/2021   IDA (iron deficiency anemia) 07/18/2021  Cholelithiasis 03/14/2021   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   Obesity, Class III, BMI 40-49.9 (morbid obesity) 07/19/2020   BRCA1 gene mutation positive 07/04/2020   Genetic testing 06/28/2020   Family history of  non-Hodgkin's lymphoma 06/01/2020   Malignant neoplasm of upper-outer quadrant of left breast in female, estrogen receptor positive (HCC) 05/26/2020   Acute right ankle pain 05/16/2016   Nondisplaced fracture of lateral malleolus of right fibula, initial encounter for closed fracture 05/16/2016    ONSET DATE: 09/24/23  REFERRING DIAG:  C79.31 (ICD-10-CM) - Secondary malignant neoplasm of brain    THERAPY DIAG:  No diagnosis found.  Rationale for Evaluation and Treatment: Rehabilitation  SUBJECTIVE:                                                                                                                                                                                             SUBJECTIVE STATEMENT: I am good. 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 Last one was in October. I couldn't drive for 6 months. Have to go back next month for scans to check for brain activity and cancer. I feel good and better. The medicine is regulated now.  Pt accompanied by: self  PERTINENT HISTORY: hx of breast cancer and total masectomy, seizures, frontal craniotomy for tumor excision 11/10/21  PAIN:  Are you having pain? No  PRECAUTIONS: Fall  RED FLAGS: None   WEIGHT BEARING RESTRICTIONS: No  FALLS: Has patient fallen in last 6 months? No  LIVING ENVIRONMENT: Lives with: lives with their family Lives in: House/apartment Stairs: Yes: External: 3 steps; none Has following equipment at home: Single point cane  PLOF: Independent  PATIENT GOALS: want to be more aligned, I can feel my gait is off. Want to be more balanced and coordinated   OBJECTIVE:  Note: Objective measures were completed at Evaluation unless otherwise noted.  DIAGNOSTIC FINDINGS: 07/08/23 Brain: Encephalomalacia and a small amount of chronic blood products are again noted at the left frontoparietal resection site. A small amount of nonsuspicious enhancement at the  resection site is unchanged. No new enhancing intracranial lesion is identified. The ventricles are normal in size. No acute infarct, midline shift, or extra-axial fluid collection is evident.  Unchanged left frontoparietal resection site. No evidence of new or progressive intracranial metastatic disease.    COGNITION: Overall cognitive status: Within functional limits for tasks assessed   SENSATION: WFL  COORDINATION: Some decreased motor control on RLE    MUSCLE LENGTH:  Some tightness in R hip, BL hamstrings, and in R ankle  POSTURE: rounded shoulders  LOWER EXTREMITY ROM:   all WFL, except R ankle DF still limited to 10d    LOWER EXTREMITY MMT:  grossly 5/5    FUNCTIONAL TESTS:   9 reps  30 seconds chair stand test Berg Balance Scale: 50/56  PATIENT SURVEYS:  ABC scale 65%                                                                                                                              TREATMENT DATE:  03/24/24 Elliptical  Rows and Lats  Leg ext HS curls  Cone taps different colors on firm and foam Ball slams    03/02/24 Gait outside 3 laps nice pace no rest 35# rows 35# lats Black tband back extension 15# straight arm pulls 60# leg press On airex head turns On airex reaching  02/26/24 2 laps around back building 4 way ankle 20 reps each Leg press 40# 2x10 Calf raises on press 2x15 Lat pull down 25# 2x10 Seated row 25# 2x10  02/12/24 Elliptical L4 x61mins  Cone taps on airex different colors- unable to keep foot up in SL Resisted gait side steps over obstacles Leg ext 15# 2x10 HS curls 35# 2x10  Walking on beam Balance on BOSU, then reaching for numbers  Shoulder ext 15# 2x10 Cable rows 15# 2x10 4# flexion to abd 2x10   02/03/24 Elliptical level 4 x 6 minutes SLS LLE x10 sec, RLE 3 sec 30 second chair stand test 12,  still below her norm for age Seated Rows & Lats 35lb 2x15 Chest Press 20lb 2x12  Triceps Ext 25lb 2x15 6in step  ups x10 each  S2S OHP yellow ball 2x10  Shoulder abd & Flex 3lb 2x10  01/29/24 Elliptical level 4 x 6 minutes Seated Rows & Lats 35lb 2x12 Shoulder Ext 10lb 2x10  Rows 15lb 2x10 Triceps Ext 35lb 2x15 Alt 6in box taps x 10  From airex x 10 S2S OHP yellow ball 2x10  Chest Press 15lb 2x12  Shoulder flex & abd 4lb 2x10 Biceps curls 8lb 2x10   01/21/24 30 secondchair stand test 12, improved from 9 but still below her norm for age Elliptical level 4 x 4 minutes Walking ball toss Ball kicks On bosu reaching and reacting Rows 35# Lats 35# Chest press 15# 35# triceps 15# biceps 9# single arm overhead carry 20 # overhead carry both arms  01/14/24 Gait outside around the back building 20# seated row Lats 20# 10# straight arm pull 10# chest press  10# biceps  25# triceps 20# farmer carry 10# overhead single arm carry, then 10# with both arms overhead 5# UE circuit 5# sit to stand with overhead press   PATIENT EDUCATION: Education details: POC Person educated: Patient Education method: Explanation Education comprehension: verbalized understanding  HOME EXERCISE PROGRAM: Walking, SLS, doing some exercises with bands  Access Code: [email or web address] URL: [email or web address] Date: 10/22/2023 Prepared by: Almetta Fam  Exercises - Single Leg Stance  -  1 x daily - 7 x weekly - 2 sets - 10 hold - Tandem Stance  - 1 x daily - 7 x weekly - 2 sets - 10 reps - Sit to Stand  - 1 x daily - 7 x weekly - 2 sets - 10 reps - Heel Raises with Counter Support  - 1 x daily - 7 x weekly - 2 sets - 10 reps - Heel Toe Raises with Counter Support  - 1 x daily - 7 x weekly - 2 sets - 10 reps  GOALS: Goals reviewed with patient? Yes  SHORT TERM GOALS: Target date: 11/27/23  Patient will be independent with initial HEP. Baseline:  Goal status: met with walking program 11/05/23  2.  Patient will improve R ankle DF to 20d Baseline: 10d Goal status:  met 12/03/23    LONG TERM  GOALS: Target date: 03/23/24  Patient will be independent with advanced/ongoing HEP to improve outcomes and carryover.  Baseline:  Goal status: progressing 01/21/24  2.  Patient will be able stand SLS on RLE >10s on firm and foam surfaces Baseline: 5s at best on firm  Goal status: progressing 01/21/24, ongoing 02/03/24  3.  Patient will be able to do at least 10 alternating taps on 6 in 20s  Baseline: 6 on 4 in 20s  Goal status: MET 11/19/23   4.  Patient will demonstrate 12 reps or more with 30s chair test Baseline: 9 Goal status: at 12 reps on 01/21/24, Met 02/03/24  5.  Patient will improve ABC confidence score to 75-80%  Baseline: 65%  Goal status: met 01/14/24  6. Patient will be able to do a light jog, small hops, and high level balance without LOB and fatigue  Goal status: ongoing 03/02/24   ASSESSMENT:  CLINICAL IMPRESSION: Patient is doing good overall. We did 3 laps with a good pace and no rest, on the last lap did have a little wobble in the knees at times, but never had tow drag or a significant issue with a LOB.  At the end of this we did walk through the grass to the front door with CGA due to the uneven terrain.    EVAL Patient is a 48 y.o. female who was seen today for physical therapy evaluation and treatment for aftercare following brain surgery. Her surgery was 2 years ago but she began having complication recently and seizures. She still has decreased coordination with RLE, and unable to do equal movements with both sides. R ankle DF has returned significantly but still missing about 10d. Pt would like to increase her mobility and ability to do things with her RLE to feel more balance and coordinated. She will benefit from PT to work on her overall strength, balance, and coordination to improve her confidence out in the community.   OBJECTIVE IMPAIRMENTS: Abnormal gait, decreased balance, decreased coordination, and decreased ROM.   ACTIVITY LIMITATIONS: locomotion  level  PARTICIPATION LIMITATIONS: community activity and yard work  PERSONAL FACTORS: Fitness, Past/current experiences, and Time since onset of injury/illness/exacerbation are also affecting patient's functional outcome.   REHAB POTENTIAL: Good  CLINICAL DECISION MAKING: Stable/uncomplicated  EVALUATION COMPLEXITY: Low  PLAN:  PT FREQUENCY: 1x/week  PT DURATION: 12 weeks  PLANNED INTERVENTIONS: [id number]-Therapeutic exercises, 97530- Therapeutic activity, [id number]- Neuromuscular re-education, 97535- Self Care, [id number]- Manual therapy, [id number]- Gait training, [phone]- Electrical stimulation (manual), [phone]- Ultrasound, Patient/Family education, Balance training, Stair training, Taping, Dry Needling, Joint mobilization, Spinal mobilization, Cryotherapy, and Moist heat  PLAN FOR  NEXT SESSION: push strength, endurance, balance and functional gait, higher level activities for an overall higher quality of life She will go on a trip later next week so , would not push too hard at the next visit  Almetta Fam, PT 03/24/2024, 11:26 AM

## 2024-03-25 ENCOUNTER — Ambulatory Visit: Attending: Neurosurgery

## 2024-03-25 DIAGNOSIS — R2681 Unsteadiness on feet: Secondary | ICD-10-CM | POA: Insufficient documentation

## 2024-03-25 DIAGNOSIS — R262 Difficulty in walking, not elsewhere classified: Secondary | ICD-10-CM | POA: Insufficient documentation

## 2024-03-25 DIAGNOSIS — M6281 Muscle weakness (generalized): Secondary | ICD-10-CM | POA: Insufficient documentation

## 2024-03-25 DIAGNOSIS — R278 Other lack of coordination: Secondary | ICD-10-CM | POA: Insufficient documentation

## 2024-03-25 DIAGNOSIS — R293 Abnormal posture: Secondary | ICD-10-CM | POA: Insufficient documentation

## 2024-03-31 ENCOUNTER — Encounter: Payer: Self-pay | Admitting: Physical Therapy

## 2024-03-31 ENCOUNTER — Ambulatory Visit: Admitting: Physical Therapy

## 2024-03-31 DIAGNOSIS — M6281 Muscle weakness (generalized): Secondary | ICD-10-CM | POA: Diagnosis present

## 2024-03-31 DIAGNOSIS — R2681 Unsteadiness on feet: Secondary | ICD-10-CM

## 2024-03-31 DIAGNOSIS — R278 Other lack of coordination: Secondary | ICD-10-CM | POA: Diagnosis present

## 2024-03-31 DIAGNOSIS — R293 Abnormal posture: Secondary | ICD-10-CM | POA: Diagnosis present

## 2024-03-31 DIAGNOSIS — R262 Difficulty in walking, not elsewhere classified: Secondary | ICD-10-CM | POA: Diagnosis present

## 2024-03-31 NOTE — Therapy (Signed)
 OUTPATIENT PHYSICAL THERAPY NEURO TREATMENT   Patient Name: Sheryl Mcmillan MRN: [id number] DOB:May 10, 1976, 48 y.o., female Today's Date: 03/31/2024   PCP: Greig Drones REFERRING PROVIDER: [name]  END OF SESSION:  PT End of Session - 03/31/24 1302     Visit Number 19    Date for Recertification  05/06/24    PT Start Time 1302    PT Stop Time 1345    PT Time Calculation (min) 43 min    Activity Tolerance Patient tolerated treatment well    Behavior During Therapy [facility] for tasks assessed/performed                  Past Medical History:  Diagnosis Date   Arthritis    Breast cancer (HCC)    Family history of non-Hodgkin's lymphoma 06/01/2020   GERD (gastroesophageal reflux disease)    Hemorrhoids    Migraines    PCOS (polycystic ovarian syndrome)    PONV (postoperative nausea and vomiting)    Seizures (HCC)    Squamous cell carcinoma of skin    Past Surgical History:  Procedure Laterality Date   APPLICATION OF CRANIAL NAVIGATION Left 11/10/2021   Procedure: APPLICATION OF CRANIAL NAVIGATION;  Surgeon: [name], MD;  Location: [facility] OR;  Service: Neurosurgery;  Laterality: Left;   AXILLARY SENTINEL NODE BIOPSY Left 11/07/2020   Procedure: LEFT AXILLARY SENTINEL NODE BIOPSY;  Surgeon: [name], MD;  Location: [facility] OR;  Service: General;  Laterality: Left;   BREAST CYST EXCISION Right 09/22/2021   Procedure: Excision of right axillary excess soft tissue.;  Surgeon: [name], MD;  Location: [facility];  Service: Plastics;  Laterality: Right;   BREAST RECONSTRUCTION WITH PLACEMENT OF TISSUE EXPANDER AND FLEX HD (ACELLULAR HYDRATED DERMIS) Bilateral 11/07/2020   Procedure: BILATERAL BREAST RECONSTRUCTION WITH PLACEMENT OF TISSUE EXPANDER AND FLEX HD (ACELLULAR HYDRATED DERMIS);  Surgeon: [name], MD;  Location: [facility] OR;  Service: Plastics;  Laterality: Bilateral;   COSMETIC SURGERY     CRANIOTOMY Left 11/10/2021    Procedure: LT FRONTAL CRANIOTOMY TUMOR EXCISION;  Surgeon: [name], MD;  Location: [facility] OR;  Service: Neurosurgery;  Laterality: Left;   DILATION AND CURETTAGE OF UTERUS     HEMORRHOID SURGERY     IR IMAGING GUIDED PORT INSERTION  06/15/2020   LIPOSUCTION WITH LIPOFILLING Bilateral 09/22/2021   Procedure: Fat grafting bilateral breast;  Surgeon: [name], MD;  Location: [facility];  Service: Plastics;  Laterality: Bilateral;   MASS EXCISION Bilateral 04/25/2021   Procedure: excision of bilateral axillary breast tissue;  Surgeon: [name], MD;  Location: [facility];  Service: Plastics;  Laterality: Bilateral;   PORT-A-CATH REMOVAL Right 11/07/2020   Procedure: REMOVAL PORT-A-CATH;  Surgeon: [name], MD;  Location: [facility] OR;  Service: General;  Laterality: Right;   REMOVAL OF BILATERAL TISSUE EXPANDERS WITH PLACEMENT OF BILATERAL BREAST IMPLANTS Bilateral 04/25/2021   Procedure: REMOVAL OF BILATERAL TISSUE EXPANDERS WITH PLACEMENT OF BILATERAL BREAST IMPLANTS;  Surgeon: [name], MD;  Location: [facility];  Service: Plastics;  Laterality: Bilateral;   ROBOTIC ASSISTED TOTAL HYSTERECTOMY WITH BILATERAL SALPINGO OOPHERECTOMY Bilateral 03/14/2021   Procedure: XI ROBOTIC ASSISTED TOTAL HYSTERECTOMY WITH BILATERAL SALPINGO OOPHORECTOMY;  Surgeon: [name], MD;  Location: [facility];  Service: Gynecology;  Laterality: Bilateral;   TOTAL MASTECTOMY Bilateral 11/07/2020   Procedure: BILATERAL MASTECTOMY;  Surgeon: [name], MD;  Location: [facility] OR;  Service: General;  Laterality: Bilateral;  Patient Active Problem List   Diagnosis Date Noted   Leukocytosis 04/12/2023   Hyperlipidemia 04/11/2023   Seizure (HCC) 04/11/2023   Brain tumor (HCC) 11/08/2021   Metastatic cancer to brain (HCC) 11/08/2021   Cerebral edema (HCC) 11/08/2021   Complex partial seizure (HCC) 11/08/2021   Brain mass 11/07/2021   Right sided  weakness 11/07/2021   IDA (iron deficiency anemia) 07/18/2021   Cholelithiasis 03/14/2021   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   Obesity, Class III, BMI 40-49.9 (morbid obesity) (HCC) 07/19/2020   BRCA1 gene mutation positive 07/04/2020   Genetic testing 06/28/2020   Family history of non-Hodgkin's lymphoma 06/01/2020   Malignant neoplasm of upper-outer quadrant of left breast in female, estrogen receptor positive (HCC) 05/26/2020   Acute right ankle pain 05/16/2016   Nondisplaced fracture of lateral malleolus of right fibula, initial encounter for closed fracture 05/16/2016    ONSET DATE: 09/24/23  REFERRING DIAG:  C79.31 (ICD-10-CM) - Secondary malignant neoplasm of brain    THERAPY DIAG:  Unsteadiness on feet  Other lack of coordination  Muscle weakness (generalized)  Rationale for Evaluation and Treatment: Rehabilitation  SUBJECTIVE:                                                                                                                                                                                             SUBJECTIVE STATEMENT: Went on a cruise, did well,  rode her electric scooter    EVAL: I started having seizures, 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 Last one was in October. I couldn't drive for 6 months. Have to go back next month for scans to check for brain activity and cancer. I feel good and better. The medicine is regulated now.  Pt accompanied by: self  PERTINENT HISTORY: hx of breast cancer and total masectomy, seizures, frontal craniotomy for tumor excision 11/10/21  PAIN:  Are you having pain? No  PRECAUTIONS: Fall  RED FLAGS: None   WEIGHT BEARING RESTRICTIONS: No  FALLS: Has patient fallen in last 6 months? No  LIVING ENVIRONMENT: Lives with: lives with their family Lives in: House/apartment Stairs: Yes: External: 3 steps; none Has following equipment at home: Single point cane  PLOF: Independent  PATIENT GOALS: want  to be more aligned, I can feel my gait is off. Want to be more balanced and coordinated   OBJECTIVE:  Note: Objective measures were completed at Evaluation unless otherwise noted.  DIAGNOSTIC FINDINGS: 07/08/23 Brain: Encephalomalacia and a small amount of chronic blood products are again noted at the left frontoparietal resection site. A small amount of nonsuspicious enhancement at the resection site is  unchanged. No new enhancing intracranial lesion is identified. The ventricles are normal in size. No acute infarct, midline shift, or extra-axial fluid collection is evident.  Unchanged left frontoparietal resection site. No evidence of new or progressive intracranial metastatic disease.    COGNITION: Overall cognitive status: Within functional limits for tasks assessed   SENSATION: WFL  COORDINATION: Some decreased motor control on RLE    MUSCLE LENGTH:  Some tightness in R hip, BL hamstrings, and in R ankle   POSTURE: rounded shoulders  LOWER EXTREMITY ROM:   all WFL, except R ankle DF still limited to 10d    LOWER EXTREMITY MMT:  grossly 5/5    FUNCTIONAL TESTS:   9 reps  30 seconds chair stand test Berg Balance Scale: 50/56  PATIENT SURVEYS:  ABC scale 65%                                                                                                                              TREATMENT DATE:  03/31/24 Elliptical L3 x 5 min HS Curls 35lb 2x10 Leg Ext 10lb 2x10 6in forward and lateral step up x10 each Seated rows and lats 35lb 2x10  Shoulder Ext 10lb 2x10 Shoulder Flex and abd 2lb 2x10 Triceps Ext 25lb 2x15  03/02/24 Gait outside 3 laps nice pace no rest 35# rows 35# lats Black tband back extension 15# straight arm pulls 60# leg press On airex head turns On airex reaching  02/26/24 2 laps around back building 4 way ankle 20 reps each Leg press 40# 2x10 Calf raises on press 2x15 Lat pull down 25# 2x10 Seated row 25# 2x10  02/12/24 Elliptical L4  x44mins  [facility] taps on airex different colors- unable to keep foot up in SL Resisted gait side steps over obstacles Leg ext 15# 2x10 HS curls 35# 2x10  Walking on beam Balance on BOSU, then reaching for numbers  Shoulder ext 15# 2x10 Cable rows 15# 2x10 4# flexion to abd 2x10   02/03/24 Elliptical level 4 x 6 minutes SLS LLE x10 sec, RLE 3 sec 30 second chair stand test 12,  still below her norm for age Seated Rows & Lats 35lb 2x15 Chest Press 20lb 2x12  Triceps Ext 25lb 2x15 6in step ups x10 each  S2S OHP yellow ball 2x10  Shoulder abd & Flex 3lb 2x10  01/29/24 Elliptical level 4 x 6 minutes Seated Rows & Lats 35lb 2x12 Shoulder Ext 10lb 2x10  Rows 15lb 2x10 Triceps Ext 35lb 2x15 Alt 6in box taps x 10  From airex x 10 S2S OHP yellow ball 2x10  Chest Press 15lb 2x12  Shoulder flex & abd 4lb 2x10 Biceps curls 8lb 2x10   01/21/24 30 secondchair stand test 12, improved from 9 but still below her norm for age Elliptical level 4 x 4 minutes Walking ball toss Ball kicks On bosu reaching and reacting Rows 35# Lats 35# Chest press 15# 35# triceps 15# biceps 9# single arm overhead  carry 20 # overhead carry both arms  01/14/24 Gait outside around the back building 20# seated row Lats 20# 10# straight arm pull 10# chest press  10# biceps  25# triceps 20# farmer carry 10# overhead single arm carry, then 10# with both arms overhead 5# UE circuit 5# sit to stand with overhead press   PATIENT EDUCATION: Education details: POC Person educated: Patient Education method: Explanation Education comprehension: verbalized understanding  HOME EXERCISE PROGRAM: Walking, SLS, doing some exercises with bands  Access Code: [email or web address] URL: [email or web address] Date: 10/22/2023 Prepared by: Almetta Fam  Exercises - Single Leg Stance  - 1 x daily - 7 x weekly - 2 sets - 10 hold - Tandem Stance  - 1 x daily - 7 x weekly - 2 sets - 10 reps - Sit to Stand  - 1 x  daily - 7 x weekly - 2 sets - 10 reps - Heel Raises with Counter Support  - 1 x daily - 7 x weekly - 2 sets - 10 reps - Heel Toe Raises with Counter Support  - 1 x daily - 7 x weekly - 2 sets - 10 reps  GOALS: Goals reviewed with patient? Yes  SHORT TERM GOALS: Target date: 11/27/23  Patient will be independent with initial HEP. Baseline:  Goal status: met with walking program 11/05/23  2.  Patient will improve R ankle DF to 20d Baseline: 10d Goal status:  met 12/03/23    LONG TERM GOALS: Target date: 03/23/24  Patient will be independent with advanced/ongoing HEP to improve outcomes and carryover.  Baseline:  Goal status: progressing 01/21/24  2.  Patient will be able stand SLS on RLE >10s on firm and foam surfaces Baseline: 5s at best on firm  Goal status: progressing 01/21/24, ongoing 02/03/24  3.  Patient will be able to do at least 10 alternating taps on 6 in 20s  Baseline: 6 on 4 in 20s  Goal status: MET 11/19/23   4.  Patient will demonstrate 12 reps or more with 30s chair test Baseline: 9 Goal status: at 12 reps on 01/21/24, Met 02/03/24  5.  Patient will improve ABC confidence score to 75-80%  Baseline: 65%  Goal status: met 01/14/24  6. Patient will be able to do a light jog, small hops, and high level balance without LOB and fatigue  Goal status: ongoing 03/02/24   ASSESSMENT:  CLINICAL IMPRESSION: Pt returns to therapy after being on vacation. She reports using her electric scooter while away.  Pt stated she wishes to increase her functional capacity without using scooter. Today she did well completing all interventions. Some difficulty and instability with lateral step ups. Some shoulder pain with rows. Shoulder fatigeu with abduction and flexion.   EVAL Patient is a 48 y.o. female who was seen today for physical therapy evaluation and treatment for aftercare following brain surgery. Her surgery was 2 years ago but she began having complication recently and  seizures. She still has decreased coordination with RLE, and unable to do equal movements with both sides. R ankle DF has returned significantly but still missing about 10d. Pt would like to increase her mobility and ability to do things with her RLE to feel more balance and coordinated. She will benefit from PT to work on her overall strength, balance, and coordination to improve her confidence out in the community.   OBJECTIVE IMPAIRMENTS: Abnormal gait, decreased balance, decreased coordination, and decreased ROM.   ACTIVITY LIMITATIONS: locomotion level  PARTICIPATION LIMITATIONS:  community activity and yard work  PERSONAL FACTORS: Fitness, Past/current experiences, and Time since onset of injury/illness/exacerbation are also affecting patient's functional outcome.   REHAB POTENTIAL: Good  CLINICAL DECISION MAKING: Stable/uncomplicated  EVALUATION COMPLEXITY: Low  PLAN:  PT FREQUENCY: 1x/week  PT DURATION: 12 weeks  PLANNED INTERVENTIONS: 97110-Therapeutic exercises, 97530- Therapeutic activity, 97112- Neuromuscular re-education, 97535- Self Care, [id number]- Manual therapy, [phone]- Gait training, [phone]- Electrical stimulation (manual), [phone]- Ultrasound, Patient/Family education, Balance training, Stair training, Taping, Dry Needling, Joint mobilization, Spinal mobilization, Cryotherapy, and Moist heat  PLAN FOR NEXT SESSION: push strength, endurance, balance and functional gait, higher level activities for an overall higher quality of life She will go on a trip later next week so , would not push too hard at the next visit  Tanda KANDICE Sorrow, PTA 03/31/2024, 1:03 PM

## 2024-04-03 ENCOUNTER — Ambulatory Visit
Admission: RE | Admit: 2024-04-03 | Discharge: 2024-04-03 | Disposition: A | Discharge status: Home / Self Care | Source: Ambulatory Visit | Attending: Internal Medicine | Admitting: Internal Medicine

## 2024-04-03 ENCOUNTER — Encounter: Payer: Self-pay | Admitting: Hematology and Oncology

## 2024-04-03 DIAGNOSIS — C7931 Secondary malignant neoplasm of brain: Secondary | ICD-10-CM

## 2024-04-03 MED ORDER — GADOPICLENOL 0.5 MMOL/ML IV SOLN
10.0000 mL | Freq: Once | INTRAVENOUS | Status: AC | PRN
  Administered 2024-04-03: 10 mL via INTRAVENOUS

## 2024-04-06 ENCOUNTER — Inpatient Hospital Stay: Attending: Hematology and Oncology | Admitting: Internal Medicine

## 2024-04-06 VITALS — BP 137/94 | HR 77 | Temp 97.8°F | Resp 16 | Ht 71.0 in | Wt 306.6 lb

## 2024-04-06 DIAGNOSIS — Z87891 Personal history of nicotine dependence: Secondary | ICD-10-CM | POA: Insufficient documentation

## 2024-04-06 DIAGNOSIS — C50412 Malignant neoplasm of upper-outer quadrant of left female breast: Secondary | ICD-10-CM | POA: Insufficient documentation

## 2024-04-06 DIAGNOSIS — Z1722 Progesterone receptor negative status: Secondary | ICD-10-CM | POA: Diagnosis not present

## 2024-04-06 DIAGNOSIS — Z807 Family history of other malignant neoplasms of lymphoid, hematopoietic and related tissues: Secondary | ICD-10-CM | POA: Diagnosis not present

## 2024-04-06 DIAGNOSIS — R569 Unspecified convulsions: Secondary | ICD-10-CM | POA: Diagnosis not present

## 2024-04-06 DIAGNOSIS — Z85828 Personal history of other malignant neoplasm of skin: Secondary | ICD-10-CM | POA: Diagnosis not present

## 2024-04-06 DIAGNOSIS — C7931 Secondary malignant neoplasm of brain: Secondary | ICD-10-CM

## 2024-04-06 DIAGNOSIS — Z809 Family history of malignant neoplasm, unspecified: Secondary | ICD-10-CM | POA: Diagnosis not present

## 2024-04-06 DIAGNOSIS — Z170 Estrogen receptor positive status [ER+]: Secondary | ICD-10-CM | POA: Diagnosis not present

## 2024-04-06 NOTE — Progress Notes (Signed)
 [facility] at [facility] [address]  [place], [place] [address] [phone]   Interval Evaluation  Date of Service: 04/06/24 Patient Name: Sheryl Mcmillan Patient MRN: [id number] Patient DOB: 06-07-1976 Provider: Arthea MARLA Manns, MD  Identifying Statement:  Sheryl Mcmillan is a 48 y.o. female with No diagnosis found.   Primary Cancer:  Oncologic History: Oncology History  Malignant neoplasm of upper-outer quadrant of left breast in female, estrogen receptor positive (HCC)  05/26/2020 Initial Diagnosis   Malignant neoplasm of upper-outer quadrant of left breast in female, estrogen receptor positive (HCC)   06/01/2020 Cancer Staging   Staging form: Breast, AJCC 8th Edition - Clinical stage from 06/01/2020: Stage IIA (cT2, cN0, cM0, G3, ER+, PR-, HER2+) - Signed by Loretha Ash, MD on 11/21/2022 Stage prefix: Initial diagnosis Histologic grading system: 3 grade system   06/16/2020 - 09/30/2020 Chemotherapy      Patient is on Antibody Plan: BREAST TRASTUZUMAB  Q21D    06/16/2020 Genetic Testing   Positive genetic testing: 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  Variant of uncertain significance in MSH3 at c.2724A>G (Silent).  No other pathogenic or uncertain variants were reported in the [facility] Multi-Cancer Panel.  The report date is June 16, 2020.    The variant of uncertain significance (VUS) in MSH3 at c.2724A>G (Silent) has been reclassified to likely benign.  The change in variant classification was made as a result of re-review of evidence in light of new variant interpretation guidelines and/or new information. The amended report date is January 10, 2021.   The Multi-Cancer Panel offered by [facility] includes sequencing and/or deletion duplication testing of the following 85 genes: AIP, ALK, APC, ATM, AXIN2,BAP1,  BARD1, BLM, BMPR1A, BRCA1, BRCA2, BRIP1, CASR, CDC73, CDH1, CDK4, CDKN1B, CDKN1C, CDKN2A (p14ARF), CDKN2A  (p16INK4a), CEBPA, CHEK2, CTNNA1, DICER1, DIS3L2, EGFR (c.2369C>T, p.Thr790Met variant only), EPCAM (Deletion/duplication testing only), FH, FLCN, GATA2, GPC3, GREM1 (Promoter region deletion/duplication testing only), HOXB13 (c.251G>A, p.Gly84Glu), HRAS, KIT, MAX, MEN1, MET, MITF (c.952G>A, p.Glu318Lys variant only), MLH1, MSH2, MSH3, MSH6, MUTYH, NBN, NF1, NF2, NTHL1, PALB2, PDGFRA, PHOX2B, PMS2, POLD1, POLE, POT1, PRKAR1A, PTCH1, PTEN, RAD50, RAD51C, RAD51D, RB1, RECQL4, RET, RNF43, RUNX1, SDHAF2, SDHA (sequence changes only), SDHB, SDHC, SDHD, SMAD4, SMARCA4, SMARCB1, SMARCE1, STK11, SUFU, TERC, TERT, TMEM127, TP53, TSC1, TSC2, VHL, WRN and WT1.    10/20/2020 - 10/20/2020 Chemotherapy    Patient is on Treatment Plan: BREAST  DOCETAXEL  + CARBOPLATIN  + TRASTUZUMAB  + PERTUZUMAB   (TCHP) Q21D       10/20/2020 - 06/23/2021 Chemotherapy   Patient is on Treatment Plan : BREAST Trastuzumab  q21d     11/10/2021 - 11/10/2021 Radiation Therapy   Site Technique Total Dose (Gy) Dose per Fx (Gy) Completed Fx Beam Energies  Brain: Brain PTV_1_Superior_3mm IMRT 18/18 18 1/1 6XFFF      CNS Oncologic History 11/10/21: Pre-op SRS L frontal (Moody) 11/12/21: L frontal craniotomy, resection Rosann) 07/10/22: Salvage SRS R frontal Valene)  Interval History: VESPER TRANT presents today for follow up after recent MRI brain.  No new or progressive changes reported today.  Remains on Keppra  1000mg  twice per day without issue, after not tolerating the Vimpat  switch.  Continues to follow with Dr. Gudena for breast cancer.  Prior (04/22/23): recent admission for seizures. Initially presented to neurologic attention on 04/07/23 while visiting family in [place] , described episode of right sided shaking, followed by weakness and confusion.  She was started on Keppra , but the event recurred after returning to [facility].  Following an admission here, Keppra  was increased to 1000mg  twice per day.  No recurrence of events  since discharge ~10 days ago, but there have been significant mood effects.  She has been very irritable and lashing out at family.  Denies headaches.  Prior: Patient presents today following recent MRI brain.  She describes some periods of word finding difficulty.  This has improved since she stopped working and lowered her stress level.  No other new or progressive changes.  Denies seizures, headaches.  Medications: Current Outpatient Medications on File Prior to Visit  Medication Sig Dispense Refill   levETIRAcetam  (KEPPRA ) 1000 MG tablet TAKE 1 TABLET(1000 MG) BY MOUTH TWICE DAILY 60 tablet 5   LORazepam  (ATIVAN ) 1 MG tablet Take one po 30 min prior to mri or radiation 7 tablet 0   Magnesium  400 MG CAPS Take 400 mg by mouth daily.     No current facility-administered medications on file prior to visit.    Allergies:  Allergies  Allergen Reactions   Carboplatin  Hives and Itching    Carboplatin  reaction after dose #5 requiring Benadryl , Solumedrol, epinephrine , Claritin .   Other Hives    Baking Soda   Wound Dressing Adhesive Hives    Rips skin, prefers paper tape   Past Medical History:  Past Medical History:  Diagnosis Date   Arthritis    Breast cancer (HCC)    Family history of non-Hodgkin's lymphoma 06/01/2020   GERD (gastroesophageal reflux disease)    Hemorrhoids    Migraines    PCOS (polycystic ovarian syndrome)    PONV (postoperative nausea and vomiting)    Seizures (HCC)    Squamous cell carcinoma of skin    Past Surgical History:  Past Surgical History:  Procedure Laterality Date   APPLICATION OF CRANIAL NAVIGATION Left 11/10/2021   Procedure: APPLICATION OF CRANIAL NAVIGATION;  Surgeon: Lanis Pupa, MD;  Location: [facility] OR;  Service: Neurosurgery;  Laterality: Left;   AXILLARY SENTINEL NODE BIOPSY Left 11/07/2020   Procedure: LEFT AXILLARY SENTINEL NODE BIOPSY;  Surgeon: [name], MD;  Location[facility] OR;  Service: General;  Laterality: Left;   BREAST  CYST EXCISION Right 09/22/2021   Procedure: Excision of right axillary excess soft tissue.;  Surgeon: [name], MD;  Location: [facility];  Service: Plastics;  Laterality: Right;   BREAST RECONSTRUCTION WITH PLACEMENT OF TISSUE EXPANDER AND FLEX HD (ACELLULAR HYDRATED DERMIS) Bilateral 11/07/2020   Procedure: BILATERAL BREAST RECONSTRUCTION WITH PLACEMENT OF TISSUE EXPANDER AND FLEX HD (ACELLULAR HYDRATED DERMIS);  Surgeon: [name], MD;  Location: [facility] OR;  Service: Plastics;  Laterality: Bilateral;   COSMETIC SURGERY     CRANIOTOMY Left 11/10/2021   Procedure: LT FRONTAL CRANIOTOMY TUMOR EXCISION;  Surgeon: Lanis Pupa, MD;  Location: [facility] OR;  Service: Neurosurgery;  Laterality: Left;   DILATION AND CURETTAGE OF UTERUS     HEMORRHOID SURGERY     IR IMAGING GUIDED PORT INSERTION  06/15/2020   LIPOSUCTION WITH LIPOFILLING Bilateral 09/22/2021   Procedure: Fat grafting bilateral breast;  Surgeon: [name], MD;  Location: [facility];  Service: Plastics;  Laterality: Bilateral;   MASS EXCISION Bilateral 04/25/2021   Procedure: excision of bilateral axillary breast tissue;  Surgeon: [name], MD;  Location: [facility];  Service: Plastics;  Laterality: Bilateral;   PORT-A-CATH REMOVAL Right 11/07/2020   Procedure: REMOVAL PORT-A-CATH;  Surgeon: [name], MD;  Location[facility] OR;  Service: General;  Laterality: Right;   REMOVAL  OF BILATERAL TISSUE EXPANDERS WITH PLACEMENT OF BILATERAL BREAST IMPLANTS Bilateral 04/25/2021   Procedure: REMOVAL OF BILATERAL TISSUE EXPANDERS WITH PLACEMENT OF BILATERAL BREAST IMPLANTS;  Surgeon: [name], MD;  Location: [facility];  Service: Plastics;  Laterality: Bilateral;   ROBOTIC ASSISTED TOTAL HYSTERECTOMY WITH BILATERAL SALPINGO OOPHERECTOMY Bilateral 03/14/2021   Procedure: XI ROBOTIC ASSISTED TOTAL HYSTERECTOMY WITH BILATERAL SALPINGO OOPHORECTOMY;  Surgeon: [name], MD;  Location: [facility];  Service: Gynecology;  Laterality: Bilateral;   TOTAL MASTECTOMY Bilateral 11/07/2020   Procedure: BILATERAL MASTECTOMY;  Surgeon: [name], MD;  Location: [facility] OR;  Service: General;  Laterality: Bilateral;   Social History:  Social History   Socioeconomic History   Marital status: Single    Spouse name: Not on file   Number of children: 1   Years of education: Not on file   Highest education level: Some college, no degree  Occupational History   Not on file  Tobacco Use   Smoking status: Former    Current packs/day: 0.00    Average packs/day: 0.5 packs/day for 10.0 years (5.0 ttl pk-yrs)    Types: Cigarettes    Start date: 08/24/2010    Quit date: 08/23/2020    Years since quitting: 3.6    Passive exposure: Never   Smokeless tobacco: Never  Vaping Use   Vaping status: Never Used  Substance and Sexual Activity   Alcohol use: Not Currently    Comment: rare   Drug use: Yes    Types: Marijuana    Comment: Last use 11/07/21   Sexual activity: Yes    Birth control/protection: Surgical    Comment: Hysterectomy  Other Topics Concern   Not on file  Social History Narrative   Not on file   Social Drivers of Health   Financial Resource Strain: Not on file  Food Insecurity: No Food Insecurity (04/15/2023)   Hunger Vital Sign    Worried About Running Out of Food in the Last Year: Never true    Ran Out of Food in the Last Year: Never true  Transportation Needs: No Transportation Needs (04/15/2023)   PRAPARE - Administrator, Civil Service (Medical): No    Lack of Transportation (Non-Medical): No  Physical Activity: Not on file  Stress: Not on file  Social Connections: Not on file  Intimate Partner Violence: Not At Risk (04/11/2023)   Humiliation, Afraid, Rape, and Kick questionnaire    Fear of Current or Ex-Partner: No    Emotionally Abused: No    Physically Abused: No    Sexually Abused: No   Family History:  Family History   Problem Relation Age of Onset   Hypertension Mother    Diabetes Mother    Cancer Mother 34       unknown primary   Other Mother        brain tumor; dx 64s   Non-Hodgkin's lymphoma Brother 61   Other Maternal Uncle 31       brain tumor   Breast cancer Other        MGM's niece; dx late 30s   Pancreatic cancer Neg Hx    Colon cancer Neg Hx    Endometrial cancer Neg Hx    Prostate cancer Neg Hx    Ovarian cancer Neg Hx     Review of Systems: Constitutional: Doesn't report fevers, chills or abnormal weight loss Eyes: Doesn't report blurriness of vision Ears, nose, mouth, throat, and face: Doesn't report sore throat  Respiratory: Doesn't report cough, dyspnea or wheezes Cardiovascular: Doesn't report palpitation, chest discomfort  Gastrointestinal:  Doesn't report nausea, constipation, diarrhea GU: Doesn't report incontinence Skin: Doesn't report skin rashes Neurological: Per HPI Musculoskeletal: Doesn't report joint pain Behavioral/Psych: Doesn't report anxiety  Physical Exam: Vitals:   04/06/24 1244  BP: (!) 137/94  Pulse: 77  Resp: 16  Temp: 97.8 F (36.6 C)  SpO2: 100%    KPS: 90. General: Alert, cooperative, pleasant, in no acute distress Head: Normal EENT: No conjunctival injection or scleral icterus.  Lungs: Resp effort normal Cardiac: Regular rate Abdomen: Non-distended abdomen Skin: No rashes cyanosis or petechiae. Extremities: No clubbing or edema  Neurologic Exam: Mental Status: Awake, alert, attentive to examiner. Oriented to self and environment. Language is fluent with intact comprehension.  Cranial Nerves: Visual acuity is grossly normal. Visual fields are full. Extra-ocular movements intact. No ptosis. Face is symmetric Motor: Tone and bulk are normal. Power is full in both arms and legs. Reflexes are symmetric, no pathologic reflexes present.  Sensory: Intact to light touch Gait: Normal.   Labs: I have reviewed the data as listed     Component Value Date/Time   NA 142 11/11/2023 0922   NA 145 (H) 04/10/2023 0950   K 4.0 11/11/2023 0922   CL 111 11/11/2023 0922   CO2 23 11/11/2023 0922   GLUCOSE 143 (H) 11/11/2023 0922   BUN 11 11/11/2023 0922   BUN 10 04/10/2023 0950   CREATININE 0.64 11/11/2023 0922   CALCIUM  8.7 (L) 11/11/2023 0922   PROT 6.6 11/11/2023 0922   PROT 6.5 04/10/2023 0950   ALBUMIN 4.1 11/11/2023 0922   ALBUMIN 4.2 04/10/2023 0950   AST 12 (L) 11/11/2023 0922   ALT 20 11/11/2023 0922   ALKPHOS 93 11/11/2023 0922   BILITOT 0.3 11/11/2023 0922   GFRNONAA >60 11/11/2023 0922   GFRAA >60 06/06/2019 1205   Lab Results  Component Value Date   WBC 9.2 11/11/2023   NEUTROABS 4.6 11/11/2023   HGB 13.5 11/11/2023   HCT 40.5 11/11/2023   MCV 83.7 11/11/2023   PLT 287 11/11/2023    Imaging:  MR BRAIN W WO CONTRAST Result Date: 04/03/2024 EXAM: MRI BRAIN WITH AND WITHOUT CONTRAST 04/03/2024 04:39:13 PM TECHNIQUE: Multiplanar multisequence MRI of the head/brain was performed with and without the administration of intravenous contrast. COMPARISON: MRI brain 11/27/2023. CLINICAL HISTORY: Brain/CNS neoplasm, assess treatment response. MR SRS Brain W/WO; 10ml Vial; 10ml Vueway  used; 0ml wasted; F/u; dx 2021; Hx of breast ca; hx of radiation. FINDINGS: BRAIN AND VENTRICLES: No acute infarct. No acute intracranial hemorrhage. No mass effect or midline shift. No hydrocephalus. The sella is unremarkable. Normal flow voids. Unchanged postoperative appearance from prior left vertex craniotomy with underlying resection cavity in the posterior aspect of the left superior frontal gyrus. Minimal residual linear enhancement in the resection cavity, unchanged. No new foci of abnormal enhancement. ORBITS: No acute abnormality. SINUSES: Mucous retention cyst in the left sphenoid sinus. BONES AND SOFT TISSUES: No abnormal marrow enhancement. No acute soft tissue abnormality. IMPRESSION: 1. Unchanged postoperative appearance  from prior left vertex craniotomy with underlying resection cavity in the posterior aspect of the left superior frontal gyrus and minimal residual linear enhancement in the resection cavity. 2. No new foci of abnormal enhancement. Electronically signed by: Ryan Chess MD 04/03/2024 05:16 PM EDT RP Workstation: [id number]    [facility] Clinician Interpretation: I have personally reviewed the radiological images as listed.  My interpretation, in the context of the  patient's clinical presentation, is stable disease   Assessment/Plan No diagnosis found.  Retta L Vanderhoff is clinically stable today.  MRI brain demonstrates stable findings, now 9 months s/p salvage SRS.  For seizures, ok to con't Keppra  1000mg  BID.  She will con't to follow with medical oncology as well.  We appreciate the opportunity to participate in the care of [facility].   We ask that Clayborne LITTIE Rei return to clinic in 4 months following next brain MRI, or sooner as needed.  All questions were answered. The patient knows to call the clinic with any problems, questions or concerns. No barriers to learning were detected.  The total time spent in the encounter was 30 minutes and more than 50% was on counseling and review of test results   Arthea MARLA Manns, MD Medical Director of Neuro-Oncology [facility] at [facility] 04/06/24 12:48 PM

## 2024-04-08 ENCOUNTER — Encounter: Payer: Self-pay | Admitting: Physical Therapy

## 2024-04-08 ENCOUNTER — Ambulatory Visit: Admitting: Physical Therapy

## 2024-04-08 DIAGNOSIS — R2681 Unsteadiness on feet: Secondary | ICD-10-CM | POA: Diagnosis not present

## 2024-04-08 DIAGNOSIS — M6281 Muscle weakness (generalized): Secondary | ICD-10-CM

## 2024-04-08 DIAGNOSIS — R262 Difficulty in walking, not elsewhere classified: Secondary | ICD-10-CM

## 2024-04-08 DIAGNOSIS — R278 Other lack of coordination: Secondary | ICD-10-CM

## 2024-04-08 NOTE — Therapy (Signed)
 OUTPATIENT PHYSICAL THERAPY NEURO TREATMENT Progress Note Reporting Period 01/07/24 to 04/08/24  See note below for Objective Data and Assessment of Progress/Goals.      Patient Name: Sheryl Mcmillan MRN: [id number] DOB:06/27/1975, 48 y.o., female Today's Date: 04/08/2024   PCP: Greig Drones REFERRING PROVIDER: [name]  END OF SESSION:  PT End of Session - 04/08/24 1349     Visit Number 20    Date for Recertification  05/06/24    PT Start Time 1349    PT Stop Time 1430    PT Time Calculation (min) 41 min    Activity Tolerance Patient tolerated treatment well    Behavior During Therapy [facility] for tasks assessed/performed                  Past Medical History:  Diagnosis Date   Arthritis    Breast cancer (HCC)    Family history of non-Hodgkin's lymphoma 06/01/2020   GERD (gastroesophageal reflux disease)    Hemorrhoids    Migraines    PCOS (polycystic ovarian syndrome)    PONV (postoperative nausea and vomiting)    Seizures (HCC)    Squamous cell carcinoma of skin    Past Surgical History:  Procedure Laterality Date   APPLICATION OF CRANIAL NAVIGATION Left 11/10/2021   Procedure: APPLICATION OF CRANIAL NAVIGATION;  Surgeon: [name], MD;  Location: [facility] OR;  Service: Neurosurgery;  Laterality: Left;   AXILLARY SENTINEL NODE BIOPSY Left 11/07/2020   Procedure: LEFT AXILLARY SENTINEL NODE BIOPSY;  Surgeon: [name], MD;  Location: [facility] OR;  Service: General;  Laterality: Left;   BREAST CYST EXCISION Right 09/22/2021   Procedure: Excision of right axillary excess soft tissue.;  Surgeon: [name], MD;  Location: [facility];  Service: Plastics;  Laterality: Right;   BREAST RECONSTRUCTION WITH PLACEMENT OF TISSUE EXPANDER AND FLEX HD (ACELLULAR HYDRATED DERMIS) Bilateral 11/07/2020   Procedure: BILATERAL BREAST RECONSTRUCTION WITH PLACEMENT OF TISSUE EXPANDER AND FLEX HD (ACELLULAR HYDRATED DERMIS);  Surgeon: [name], MD;  Location: [facility] OR;  Service: Plastics;  Laterality: Bilateral;   COSMETIC SURGERY     CRANIOTOMY Left 11/10/2021   Procedure: LT FRONTAL CRANIOTOMY TUMOR EXCISION;  Surgeon: [name], MD;  Location: [facility] OR;  Service: Neurosurgery;  Laterality: Left;   DILATION AND CURETTAGE OF UTERUS     HEMORRHOID SURGERY     IR IMAGING GUIDED PORT INSERTION  06/15/2020   LIPOSUCTION WITH LIPOFILLING Bilateral 09/22/2021   Procedure: Fat grafting bilateral breast;  Surgeon: [name], MD;  Location: [facility];  Service: Plastics;  Laterality: Bilateral;   MASS EXCISION Bilateral 04/25/2021   Procedure: excision of bilateral axillary breast tissue;  Surgeon: [name], MD;  Location: [facility];  Service: Plastics;  Laterality: Bilateral;   PORT-A-CATH REMOVAL Right 11/07/2020   Procedure: REMOVAL PORT-A-CATH;  Surgeon: [name], MD;  Location: [facility] OR;  Service: General;  Laterality: Right;   REMOVAL OF BILATERAL TISSUE EXPANDERS WITH PLACEMENT OF BILATERAL BREAST IMPLANTS Bilateral 04/25/2021   Procedure: REMOVAL OF BILATERAL TISSUE EXPANDERS WITH PLACEMENT OF BILATERAL BREAST IMPLANTS;  Surgeon: [name], MD;  Location: [facility];  Service: Plastics;  Laterality: Bilateral;   ROBOTIC ASSISTED TOTAL HYSTERECTOMY WITH BILATERAL SALPINGO OOPHERECTOMY Bilateral 03/14/2021   Procedure: XI ROBOTIC ASSISTED TOTAL HYSTERECTOMY WITH BILATERAL SALPINGO OOPHORECTOMY;  Surgeon: [name], MD;  Location: [facility];  Service: Gynecology;  Laterality: Bilateral;   TOTAL MASTECTOMY Bilateral  11/07/2020   Procedure: BILATERAL MASTECTOMY;  Surgeon: [name], MD;  Location: [facility] OR;  Service: General;  Laterality: Bilateral;   Patient Active Problem List   Diagnosis Date Noted   Leukocytosis 04/12/2023   Hyperlipidemia 04/11/2023   Seizure (HCC) 04/11/2023   Brain tumor (HCC) 11/08/2021   Metastatic cancer to brain (HCC) 11/08/2021    Cerebral edema (HCC) 11/08/2021   Complex partial seizure (HCC) 11/08/2021   Brain mass 11/07/2021   Right sided weakness 11/07/2021   IDA (iron deficiency anemia) 07/18/2021   Cholelithiasis 03/14/2021   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   Obesity, Class III, BMI 40-49.9 (morbid obesity) (HCC) 07/19/2020   BRCA1 gene mutation positive 07/04/2020   Genetic testing 06/28/2020   Family history of non-Hodgkin's lymphoma 06/01/2020   Malignant neoplasm of upper-outer quadrant of left breast in female, estrogen receptor positive (HCC) 05/26/2020   Acute right ankle pain 05/16/2016   Nondisplaced fracture of lateral malleolus of right fibula, initial encounter for closed fracture 05/16/2016    ONSET DATE: 09/24/23  REFERRING DIAG:  C79.31 (ICD-10-CM) - Secondary malignant neoplasm of brain    THERAPY DIAG:  Unsteadiness on feet  Muscle weakness (generalized)  Other lack of coordination  Difficulty in walking, not elsewhere classified  Rationale for Evaluation and Treatment: Rehabilitation  SUBJECTIVE:                                                                                                                                                                                             SUBJECTIVE STATEMENT: Where good will be leaving for a cruise in to weeks     EVAL: I started having seizures, 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 Last one was in October. I couldn't drive for 6 months. Have to go back next month for scans to check for brain activity and cancer. I feel good and better. The medicine is regulated now.  Pt accompanied by: self  PERTINENT HISTORY: hx of breast cancer and total masectomy, seizures, frontal craniotomy for tumor excision 11/10/21  PAIN:  Are you having pain? No  PRECAUTIONS: Fall  RED FLAGS: None   WEIGHT BEARING RESTRICTIONS: No  FALLS: Has patient fallen in last 6 months? No  LIVING ENVIRONMENT: Lives with: lives with their  family Lives in: House/apartment Stairs: Yes: External: 3 steps; none Has following equipment at home: Single point cane  PLOF: Independent  PATIENT GOALS: want to be more aligned, I can feel my gait is off. Want to be more balanced and coordinated   OBJECTIVE:  Note: Objective measures were completed at Evaluation unless otherwise noted.  DIAGNOSTIC FINDINGS:  07/08/23 Brain: Encephalomalacia and a small amount of chronic blood products are again noted at the left frontoparietal resection site. A small amount of nonsuspicious enhancement at the resection site is unchanged. No new enhancing intracranial lesion is identified. The ventricles are normal in size. No acute infarct, midline shift, or extra-axial fluid collection is evident.  Unchanged left frontoparietal resection site. No evidence of new or progressive intracranial metastatic disease.    COGNITION: Overall cognitive status: Within functional limits for tasks assessed   SENSATION: WFL  COORDINATION: Some decreased motor control on RLE    MUSCLE LENGTH:  Some tightness in R hip, BL hamstrings, and in R ankle   POSTURE: rounded shoulders  LOWER EXTREMITY ROM:   all WFL, except R ankle DF still limited to 10d    LOWER EXTREMITY MMT:  grossly 5/5    FUNCTIONAL TESTS:   9 reps  30 seconds chair stand test Berg Balance Scale: 50/56  PATIENT SURVEYS:  ABC scale 65%                                                                                                                              TREATMENT DATE:  04/08/24 Elliptical L3 x 5 min Seated rows and lats 45lb 2x10  Shoulder Ext 15lb 2x10 Rows 15lb 2x10 Shoulder abd 3lb 2x10 SLS x 3 10'' each from floor   Unable to complete with RLE  Leg press 60lb 2x10 6in step ups x 5 each  03/31/24 Elliptical L3 x 5 min HS Curls 35lb 2x10 Leg Ext 10lb 2x10 6in forward and lateral step up x10 each Seated rows and lats 35lb 2x10  Shoulder Ext 10lb 2x10 Shoulder  Flex and abd 2lb 2x10 Triceps Ext 25lb 2x15  03/02/24 Gait outside 3 laps nice pace no rest 35# rows 35# lats Black tband back extension 15# straight arm pulls 60# leg press On airex head turns On airex reaching  02/26/24 2 laps around back building 4 way ankle 20 reps each Leg press 40# 2x10 Calf raises on press 2x15 Lat pull down 25# 2x10 Seated row 25# 2x10  02/12/24 Elliptical L4 x66mins  [facility] taps on airex different colors- unable to keep foot up in SL Resisted gait side steps over obstacles Leg ext 15# 2x10 HS curls 35# 2x10  Walking on beam Balance on BOSU, then reaching for numbers  Shoulder ext 15# 2x10 Cable rows 15# 2x10 4# flexion to abd 2x10   02/03/24 Elliptical level 4 x 6 minutes SLS LLE x10 sec, RLE 3 sec 30 second chair stand test 12,  still below her norm for age Seated Rows & Lats 35lb 2x15 Chest Press 20lb 2x12  Triceps Ext 25lb 2x15 6in step ups x10 each  S2S OHP yellow ball 2x10  Shoulder abd & Flex 3lb 2x10  01/29/24 Elliptical level 4 x 6 minutes Seated Rows & Lats 35lb 2x12 Shoulder Ext 10lb 2x10  Rows 15lb 2x10 Triceps Ext 35lb 2x15  Alt 6in box taps x 10  From airex x 10 S2S OHP yellow ball 2x10  Chest Press 15lb 2x12  Shoulder flex & abd 4lb 2x10 Biceps curls 8lb 2x10   01/21/24 30 secondchair stand test 12, improved from 9 but still below her norm for age Elliptical level 4 x 4 minutes Walking ball toss Ball kicks On bosu reaching and reacting Rows 35# Lats 35# Chest press 15# 35# triceps 15# biceps 9# single arm overhead carry 20 # overhead carry both arms  01/14/24 Gait outside around the back building 20# seated row Lats 20# 10# straight arm pull 10# chest press  10# biceps  25# triceps 20# farmer carry 10# overhead single arm carry, then 10# with both arms overhead 5# UE circuit 5# sit to stand with overhead press   PATIENT EDUCATION: Education details: POC Person educated: Patient Education method:  Explanation Education comprehension: verbalized understanding  HOME EXERCISE PROGRAM: Walking, SLS, doing some exercises with bands  Access Code: [email or web address] URL: [email or web address] Date: 10/22/2023 Prepared by: Almetta Fam  Exercises - Single Leg Stance  - 1 x daily - 7 x weekly - 2 sets - 10 hold - Tandem Stance  - 1 x daily - 7 x weekly - 2 sets - 10 reps - Sit to Stand  - 1 x daily - 7 x weekly - 2 sets - 10 reps - Heel Raises with Counter Support  - 1 x daily - 7 x weekly - 2 sets - 10 reps - Heel Toe Raises with Counter Support  - 1 x daily - 7 x weekly - 2 sets - 10 reps  GOALS: Goals reviewed with patient? Yes  SHORT TERM GOALS: Target date: 11/27/23  Patient will be independent with initial HEP. Baseline:  Goal status: met with walking program 11/05/23  2.  Patient will improve R ankle DF to 20d Baseline: 10d Goal status:  met 12/03/23    LONG TERM GOALS: Target date: 03/23/24  Patient will be independent with advanced/ongoing HEP to improve outcomes and carryover.  Baseline:  Goal status: progressing 01/21/24  2.  Patient will be able stand SLS on RLE >10s on firm and foam surfaces Baseline: 5s at best on firm  Goal status: progressing 01/21/24, ongoing 02/03/24  3.  Patient will be able to do at least 10 alternating taps on 6 in 20s  Baseline: 6 on 4 in 20s  Goal status: MET 11/19/23   4.  Patient will demonstrate 12 reps or more with 30s chair test Baseline: 9 Goal status: at 12 reps on 01/21/24, Met 02/03/24  5.  Patient will improve ABC confidence score to 75-80%  Baseline: 65%  Goal status: met 01/14/24  6. Patient will be able to do a light jog, small hops, and high level balance without LOB and fatigue  Goal status: ongoing 03/02/24, ongoing 04/08/24   ASSESSMENT:  CLINICAL IMPRESSION: Pt enters doing well. She continues to report issues with her balance. She stated MD suspected dhe coul be having seizures in her RLE. Unable to complete  SLS with her RLE due to instability. Will be leaving for a cruise in two weeks, she repots taking her electric scooter to get around.  Pt stated she wishes to increase her functional capacity without using scooter. Today she did well completing all interventions.   EVAL Patient is a 48 y.o. female who was seen today for physical therapy evaluation and treatment for aftercare following brain surgery. Her surgery was 2 years ago  but she began having complication recently and seizures. She still has decreased coordination with RLE, and unable to do equal movements with both sides. R ankle DF has returned significantly but still missing about 10d. Pt would like to increase her mobility and ability to do things with her RLE to feel more balance and coordinated. She will benefit from PT to work on her overall strength, balance, and coordination to improve her confidence out in the community.   OBJECTIVE IMPAIRMENTS: Abnormal gait, decreased balance, decreased coordination, and decreased ROM.   ACTIVITY LIMITATIONS: locomotion level  PARTICIPATION LIMITATIONS: community activity and yard work  PERSONAL FACTORS: Fitness, Past/current experiences, and Time since onset of injury/illness/exacerbation are also affecting patient's functional outcome.   REHAB POTENTIAL: Good  CLINICAL DECISION MAKING: Stable/uncomplicated  EVALUATION COMPLEXITY: Low  PLAN:  PT FREQUENCY: 1x/week  PT DURATION: 12 weeks  PLANNED INTERVENTIONS: 97110-Therapeutic exercises, 97530- Therapeutic activity, 97112- Neuromuscular re-education, 97535- Self Care, [id number]- Manual therapy, [phone]- Gait training, [phone]- Electrical stimulation (manual), [phone]- Ultrasound, Patient/Family education, Balance training, Stair training, Taping, Dry Needling, Joint mobilization, Spinal mobilization, Cryotherapy, and Moist heat  PLAN FOR NEXT SESSION: push strength, endurance, balance and functional gait, higher level activities for an overall  higher quality of life She will go on a trip later next week so , would not push too hard at the next visit  Tanda KANDICE Sorrow, PTA 04/08/2024, 1:49 PM

## 2024-04-10 ENCOUNTER — Telehealth: Payer: Self-pay | Admitting: Internal Medicine

## 2024-04-10 NOTE — Telephone Encounter (Signed)
 Called the patient to schedule her next appointment in February. She said that someone called her and set up her MRI but does not know when it is. I don't see an MRI scheduled in her upcoming appointments, but the patient will call back to set up her office visit.

## 2024-04-14 ENCOUNTER — Encounter: Payer: Self-pay | Admitting: Hematology and Oncology

## 2024-04-15 ENCOUNTER — Encounter: Payer: Self-pay | Admitting: Physical Therapy

## 2024-04-15 ENCOUNTER — Ambulatory Visit: Admitting: Physical Therapy

## 2024-04-15 ENCOUNTER — Encounter: Payer: Self-pay | Admitting: Hematology and Oncology

## 2024-04-15 DIAGNOSIS — R293 Abnormal posture: Secondary | ICD-10-CM

## 2024-04-15 DIAGNOSIS — R262 Difficulty in walking, not elsewhere classified: Secondary | ICD-10-CM

## 2024-04-15 DIAGNOSIS — R278 Other lack of coordination: Secondary | ICD-10-CM

## 2024-04-15 DIAGNOSIS — R2681 Unsteadiness on feet: Secondary | ICD-10-CM | POA: Diagnosis not present

## 2024-04-15 DIAGNOSIS — M6281 Muscle weakness (generalized): Secondary | ICD-10-CM

## 2024-04-15 NOTE — Therapy (Signed)
 OUTPATIENT PHYSICAL THERAPY NEURO TREATMENT    Patient Name: Sheryl Mcmillan MRN: [id number] DOB:1975/06/27, 48 y.o., female Today's Date: 04/15/2024   PCP: Greig Drones REFERRING PROVIDER: [name]  END OF SESSION:  PT End of Session - 04/15/24 1145     Visit Number 21    Date for Recertification  05/06/24    PT Start Time 1145    PT Stop Time 1230    PT Time Calculation (min) 45 min    Activity Tolerance Patient tolerated treatment well    Behavior During Therapy [facility] for tasks assessed/performed                  Past Medical History:  Diagnosis Date   Arthritis    Breast cancer (HCC)    Family history of non-Hodgkin's lymphoma 06/01/2020   GERD (gastroesophageal reflux disease)    Hemorrhoids    Migraines    PCOS (polycystic ovarian syndrome)    PONV (postoperative nausea and vomiting)    Seizures (HCC)    Squamous cell carcinoma of skin    Past Surgical History:  Procedure Laterality Date   APPLICATION OF CRANIAL NAVIGATION Left 11/10/2021   Procedure: APPLICATION OF CRANIAL NAVIGATION;  Surgeon: [name], MD;  Location: [facility] OR;  Service: Neurosurgery;  Laterality: Left;   AXILLARY SENTINEL NODE BIOPSY Left 11/07/2020   Procedure: LEFT AXILLARY SENTINEL NODE BIOPSY;  Surgeon: [name], MD;  Location: [facility] OR;  Service: General;  Laterality: Left;   BREAST CYST EXCISION Right 09/22/2021   Procedure: Excision of right axillary excess soft tissue.;  Surgeon: [name], MD;  Location: [facility];  Service: Plastics;  Laterality: Right;   BREAST RECONSTRUCTION WITH PLACEMENT OF TISSUE EXPANDER AND FLEX HD (ACELLULAR HYDRATED DERMIS) Bilateral 11/07/2020   Procedure: BILATERAL BREAST RECONSTRUCTION WITH PLACEMENT OF TISSUE EXPANDER AND FLEX HD (ACELLULAR HYDRATED DERMIS);  Surgeon: [name], MD;  Location: [facility] OR;  Service: Plastics;  Laterality: Bilateral;   COSMETIC SURGERY     CRANIOTOMY Left 11/10/2021    Procedure: LT FRONTAL CRANIOTOMY TUMOR EXCISION;  Surgeon: [name], MD;  Location: [facility] OR;  Service: Neurosurgery;  Laterality: Left;   DILATION AND CURETTAGE OF UTERUS     HEMORRHOID SURGERY     IR IMAGING GUIDED PORT INSERTION  06/15/2020   LIPOSUCTION WITH LIPOFILLING Bilateral 09/22/2021   Procedure: Fat grafting bilateral breast;  Surgeon: [name], MD;  Location: [facility];  Service: Plastics;  Laterality: Bilateral;   MASS EXCISION Bilateral 04/25/2021   Procedure: excision of bilateral axillary breast tissue;  Surgeon: [name], MD;  Location: [facility];  Service: Plastics;  Laterality: Bilateral;   PORT-A-CATH REMOVAL Right 11/07/2020   Procedure: REMOVAL PORT-A-CATH;  Surgeon: [name], MD;  Location: [facility] OR;  Service: General;  Laterality: Right;   REMOVAL OF BILATERAL TISSUE EXPANDERS WITH PLACEMENT OF BILATERAL BREAST IMPLANTS Bilateral 04/25/2021   Procedure: REMOVAL OF BILATERAL TISSUE EXPANDERS WITH PLACEMENT OF BILATERAL BREAST IMPLANTS;  Surgeon: [name], MD;  Location: [facility];  Service: Plastics;  Laterality: Bilateral;   ROBOTIC ASSISTED TOTAL HYSTERECTOMY WITH BILATERAL SALPINGO OOPHERECTOMY Bilateral 03/14/2021   Procedure: XI ROBOTIC ASSISTED TOTAL HYSTERECTOMY WITH BILATERAL SALPINGO OOPHORECTOMY;  Surgeon: [name], MD;  Location: [facility];  Service: Gynecology;  Laterality: Bilateral;   TOTAL MASTECTOMY Bilateral 11/07/2020   Procedure: BILATERAL MASTECTOMY;  Surgeon: [name], MD;  Location: [facility] OR;  Service: General;  Laterality:  Bilateral;   Patient Active Problem List   Diagnosis Date Noted   Leukocytosis 04/12/2023   Hyperlipidemia 04/11/2023   Seizure (HCC) 04/11/2023   Brain tumor (HCC) 11/08/2021   Metastatic cancer to brain (HCC) 11/08/2021   Cerebral edema (HCC) 11/08/2021   Complex partial seizure (HCC) 11/08/2021   Brain mass 11/07/2021   Right sided  weakness 11/07/2021   IDA (iron deficiency anemia) 07/18/2021   Cholelithiasis 03/14/2021   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   Obesity, Class III, BMI 40-49.9 (morbid obesity) (HCC) 07/19/2020   BRCA1 gene mutation positive 07/04/2020   Genetic testing 06/28/2020   Family history of non-Hodgkin's lymphoma 06/01/2020   Malignant neoplasm of upper-outer quadrant of left breast in female, estrogen receptor positive (HCC) 05/26/2020   Acute right ankle pain 05/16/2016   Nondisplaced fracture of lateral malleolus of right fibula, initial encounter for closed fracture 05/16/2016    ONSET DATE: 09/24/23  REFERRING DIAG:  C79.31 (ICD-10-CM) - Secondary malignant neoplasm of brain    THERAPY DIAG:  Unsteadiness on feet  Muscle weakness (generalized)  Other lack of coordination  Difficulty in walking, not elsewhere classified  Abnormal posture  Rationale for Evaluation and Treatment: Rehabilitation  SUBJECTIVE:                                                                                                                                                                                             SUBJECTIVE STATEMENT: Good, I'm feeling good    EVAL: I started having seizures, 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 Last one was in October. I couldn't drive for 6 months. Have to go back next month for scans to check for brain activity and cancer. I feel good and better. The medicine is regulated now.  Pt accompanied by: self  PERTINENT HISTORY: hx of breast cancer and total masectomy, seizures, frontal craniotomy for tumor excision 11/10/21  PAIN:  Are you having pain? No  PRECAUTIONS: Fall  RED FLAGS: None   WEIGHT BEARING RESTRICTIONS: No  FALLS: Has patient fallen in last 6 months? No  LIVING ENVIRONMENT: Lives with: lives with their family Lives in: House/apartment Stairs: Yes: External: 3 steps; none Has following equipment at home: Single point  cane  PLOF: Independent  PATIENT GOALS: want to be more aligned, I can feel my gait is off. Want to be more balanced and coordinated   OBJECTIVE:  Note: Objective measures were completed at Evaluation unless otherwise noted.  DIAGNOSTIC FINDINGS: 07/08/23 Brain: Encephalomalacia and a small amount of chronic blood products are again noted at the left frontoparietal resection site. A small amount of nonsuspicious  enhancement at the resection site is unchanged. No new enhancing intracranial lesion is identified. The ventricles are normal in size. No acute infarct, midline shift, or extra-axial fluid collection is evident.  Unchanged left frontoparietal resection site. No evidence of new or progressive intracranial metastatic disease.    COGNITION: Overall cognitive status: Within functional limits for tasks assessed   SENSATION: WFL  COORDINATION: Some decreased motor control on RLE    MUSCLE LENGTH:  Some tightness in R hip, BL hamstrings, and in R ankle   POSTURE: rounded shoulders  LOWER EXTREMITY ROM:   all WFL, except R ankle DF still limited to 10d    LOWER EXTREMITY MMT:  grossly 5/5    FUNCTIONAL TESTS:   9 reps  30 seconds chair stand test Berg Balance Scale: 50/56  PATIENT SURVEYS:  ABC scale 65%                                                                                                                              TREATMENT DATE:  04/15/24 Gait 3 laps around the back building up and down slope Seated Rows & Lats 45lb 2x12 Leg press 60lb 2x10 Shoulder abd 3lb 2x10. Standing rows 15lb 2x10  04/08/24 Elliptical L3 x 5 min Seated rows and lats 45lb 2x10  Shoulder Ext 15lb 2x10 Rows 15lb 2x10 Shoulder abd 3lb 2x10 SLS x 3 10'' each from floor   Unable to complete with RLE  Leg press 60lb 2x10 6in step ups x 5 each  03/31/24 Elliptical L3 x 5 min HS Curls 35lb 2x10 Leg Ext 10lb 2x10 6in forward and lateral step up x10 each Seated rows and lats  35lb 2x10  Shoulder Ext 10lb 2x10 Shoulder Flex and abd 2lb 2x10 Triceps Ext 25lb 2x15  03/02/24 Gait outside 3 laps nice pace no rest 35# rows 35# lats Black tband back extension 15# straight arm pulls 60# leg press On airex head turns On airex reaching  02/26/24 2 laps around back building 4 way ankle 20 reps each Leg press 40# 2x10 Calf raises on press 2x15 Lat pull down 25# 2x10 Seated row 25# 2x10  02/12/24 Elliptical L4 x5mins  [facility] taps on airex different colors- unable to keep foot up in SL Resisted gait side steps over obstacles Leg ext 15# 2x10 HS curls 35# 2x10  Walking on beam Balance on BOSU, then reaching for numbers  Shoulder ext 15# 2x10 Cable rows 15# 2x10 4# flexion to abd 2x10   02/03/24 Elliptical level 4 x 6 minutes SLS LLE x10 sec, RLE 3 sec 30 second chair stand test 12,  still below her norm for age Seated Rows & Lats 35lb 2x15 Chest Press 20lb 2x12  Triceps Ext 25lb 2x15 6in step ups x10 each  S2S OHP yellow ball 2x10  Shoulder abd & Flex 3lb 2x10  01/29/24 Elliptical level 4 x 6 minutes Seated Rows & Lats 35lb 2x12 Shoulder Ext 10lb 2x10  Rows  15lb 2x10 Triceps Ext 35lb 2x15 Alt 6in box taps x 10  From airex x 10 S2S OHP yellow ball 2x10  Chest Press 15lb 2x12  Shoulder flex & abd 4lb 2x10 Biceps curls 8lb 2x10   01/21/24 30 secondchair stand test 12, improved from 9 but still below her norm for age Elliptical level 4 x 4 minutes Walking ball toss Ball kicks On bosu reaching and reacting Rows 35# Lats 35# Chest press 15# 35# triceps 15# biceps 9# single arm overhead carry 20 # overhead carry both arms  01/14/24 Gait outside around the back building 20# seated row Lats 20# 10# straight arm pull 10# chest press  10# biceps  25# triceps 20# farmer carry 10# overhead single arm carry, then 10# with both arms overhead 5# UE circuit 5# sit to stand with overhead press   PATIENT EDUCATION: Education details:  POC Person educated: Patient Education method: Explanation Education comprehension: verbalized understanding  HOME EXERCISE PROGRAM: Walking, SLS, doing some exercises with bands  Access Code: [email or web address] URL: [email or web address] Date: 10/22/2023 Prepared by: Almetta Fam  Exercises - Single Leg Stance  - 1 x daily - 7 x weekly - 2 sets - 10 hold - Tandem Stance  - 1 x daily - 7 x weekly - 2 sets - 10 reps - Sit to Stand  - 1 x daily - 7 x weekly - 2 sets - 10 reps - Heel Raises with Counter Support  - 1 x daily - 7 x weekly - 2 sets - 10 reps - Heel Toe Raises with Counter Support  - 1 x daily - 7 x weekly - 2 sets - 10 reps  GOALS: Goals reviewed with patient? Yes  SHORT TERM GOALS: Target date: 11/27/23  Patient will be independent with initial HEP. Baseline:  Goal status: met with walking program 11/05/23  2.  Patient will improve R ankle DF to 20d Baseline: 10d Goal status:  met 12/03/23    LONG TERM GOALS: Target date: 03/23/24  Patient will be independent with advanced/ongoing HEP to improve outcomes and carryover.  Baseline:  Goal status: progressing 01/21/24  2.  Patient will be able stand SLS on RLE >10s on firm and foam surfaces Baseline: 5s at best on firm  Goal status: progressing 01/21/24, ongoing 02/03/24  3.  Patient will be able to do at least 10 alternating taps on 6 in 20s  Baseline: 6 on 4 in 20s  Goal status: MET 11/19/23   4.  Patient will demonstrate 12 reps or more with 30s chair test Baseline: 9 Goal status: at 12 reps on 01/21/24, Met 02/03/24  5.  Patient will improve ABC confidence score to 75-80%  Baseline: 65%  Goal status: met 01/14/24  6. Patient will be able to do a light jog, small hops, and high level balance without LOB and fatigue  Goal status: ongoing 03/02/24, ongoing 04/08/24   ASSESSMENT:  CLINICAL IMPRESSION: Pt enters doing well. She continues to report issues with her balance. Pt tolerated additional laps  outside the back building.  SLS remains difficult. Postural cues needed with seated rows. Will be leaving for a cruise in one week, she repots taking her electric scooter to get around.  Pt stated she wishes to increase her functional capacity without using scooter. Today she did well completing all interventions.   EVAL Patient is a 48 y.o. female who was seen today for physical therapy evaluation and treatment for aftercare following brain surgery. Her surgery was 2  years ago but she began having complication recently and seizures. She still has decreased coordination with RLE, and unable to do equal movements with both sides. R ankle DF has returned significantly but still missing about 10d. Pt would like to increase her mobility and ability to do things with her RLE to feel more balance and coordinated. She will benefit from PT to work on her overall strength, balance, and coordination to improve her confidence out in the community.   OBJECTIVE IMPAIRMENTS: Abnormal gait, decreased balance, decreased coordination, and decreased ROM.   ACTIVITY LIMITATIONS: locomotion level  PARTICIPATION LIMITATIONS: community activity and yard work  PERSONAL FACTORS: Fitness, Past/current experiences, and Time since onset of injury/illness/exacerbation are also affecting patient's functional outcome.   REHAB POTENTIAL: Good  CLINICAL DECISION MAKING: Stable/uncomplicated  EVALUATION COMPLEXITY: Low  PLAN:  PT FREQUENCY: 1x/week  PT DURATION: 12 weeks  PLANNED INTERVENTIONS: 97110-Therapeutic exercises, 97530- Therapeutic activity, 97112- Neuromuscular re-education, 97535- Self Care, [id number]- Manual therapy, [phone]- Gait training, [phone]- Electrical stimulation (manual), [phone]- Ultrasound, Patient/Family education, Balance training, Stair training, Taping, Dry Needling, Joint mobilization, Spinal mobilization, Cryotherapy, and Moist heat  PLAN FOR NEXT SESSION: push strength, endurance, balance and  functional gait, higher level activities for an overall higher quality of life She will go on a trip later next week so , would not push too hard at the next visit  Tanda KANDICE Sorrow, PTA 04/15/2024, 11:46 AM

## 2024-04-20 NOTE — Therapy (Incomplete)
 OUTPATIENT PHYSICAL THERAPY NEURO TREATMENT    Patient Name: Sheryl Mcmillan MRN: [id number] DOB:1975-06-29, 48 y.o., female Today's Date: 04/20/2024   PCP: Greig Drones REFERRING PROVIDER: [name]  END OF SESSION:            Past Medical History:  Diagnosis Date   Arthritis    Breast cancer [facility])    Family history of non-Hodgkin's lymphoma 06/01/2020   GERD (gastroesophageal reflux disease)    Hemorrhoids    Migraines    PCOS (polycystic ovarian syndrome)    PONV (postoperative nausea and vomiting)    Seizures (HCC)    Squamous cell carcinoma of skin    Past Surgical History:  Procedure Laterality Date   APPLICATION OF CRANIAL NAVIGATION Left 11/10/2021   Procedure: APPLICATION OF CRANIAL NAVIGATION;  Surgeon: [name], MD;  Location: [facility] OR;  Service: Neurosurgery;  Laterality: Left;   AXILLARY SENTINEL NODE BIOPSY Left 11/07/2020   Procedure: LEFT AXILLARY SENTINEL NODE BIOPSY;  Surgeon: [name], MD;  Location: [facility] OR;  Service: General;  Laterality: Left;   BREAST CYST EXCISION Right 09/22/2021   Procedure: Excision of right axillary excess soft tissue.;  Surgeon: [name], MD;  Location: [facility];  Service: Plastics;  Laterality: Right;   BREAST RECONSTRUCTION WITH PLACEMENT OF TISSUE EXPANDER AND FLEX HD (ACELLULAR HYDRATED DERMIS) Bilateral 11/07/2020   Procedure: BILATERAL BREAST RECONSTRUCTION WITH PLACEMENT OF TISSUE EXPANDER AND FLEX HD (ACELLULAR HYDRATED DERMIS);  Surgeon: [name], MD;  Location: [facility] OR;  Service: Plastics;  Laterality: Bilateral;   COSMETIC SURGERY     CRANIOTOMY Left 11/10/2021   Procedure: LT FRONTAL CRANIOTOMY TUMOR EXCISION;  Surgeon: [name], MD;  Location: [facility] OR;  Service: Neurosurgery;  Laterality: Left;   DILATION AND CURETTAGE OF UTERUS     HEMORRHOID SURGERY     IR IMAGING GUIDED PORT INSERTION  06/15/2020   LIPOSUCTION WITH LIPOFILLING Bilateral 09/22/2021    Procedure: Fat grafting bilateral breast;  Surgeon: [name], MD;  Location: [facility];  Service: Plastics;  Laterality: Bilateral;   MASS EXCISION Bilateral 04/25/2021   Procedure: excision of bilateral axillary breast tissue;  Surgeon: [name], MD;  Location: [facility];  Service: Plastics;  Laterality: Bilateral;   PORT-A-CATH REMOVAL Right 11/07/2020   Procedure: REMOVAL PORT-A-CATH;  Surgeon: [name], MD;  Location: [facility] OR;  Service: General;  Laterality: Right;   REMOVAL OF BILATERAL TISSUE EXPANDERS WITH PLACEMENT OF BILATERAL BREAST IMPLANTS Bilateral 04/25/2021   Procedure: REMOVAL OF BILATERAL TISSUE EXPANDERS WITH PLACEMENT OF BILATERAL BREAST IMPLANTS;  Surgeon: [name], MD;  Location: [facility];  Service: Plastics;  Laterality: Bilateral;   ROBOTIC ASSISTED TOTAL HYSTERECTOMY WITH BILATERAL SALPINGO OOPHERECTOMY Bilateral 03/14/2021   Procedure: XI ROBOTIC ASSISTED TOTAL HYSTERECTOMY WITH BILATERAL SALPINGO OOPHORECTOMY;  Surgeon: [name], MD;  Location: [facility];  Service: Gynecology;  Laterality: Bilateral;   TOTAL MASTECTOMY Bilateral 11/07/2020   Procedure: BILATERAL MASTECTOMY;  Surgeon: [name], MD;  Location: [facility] OR;  Service: General;  Laterality: Bilateral;   Patient Active Problem List   Diagnosis Date Noted   Leukocytosis 04/12/2023   Hyperlipidemia 04/11/2023   Seizure (HCC) 04/11/2023   Brain tumor (HCC) 11/08/2021   Metastatic cancer to brain (HCC) 11/08/2021   Cerebral edema (HCC) 11/08/2021   Complex partial seizure (HCC) 11/08/2021   Brain mass 11/07/2021   Right sided weakness 11/07/2021   IDA (iron deficiency anemia) 07/18/2021  Cholelithiasis 03/14/2021   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   Obesity, Class III, BMI 40-49.9 (morbid obesity) (HCC) 07/19/2020   BRCA1 gene mutation positive 07/04/2020   Genetic testing 06/28/2020   Family history of  non-Hodgkin's lymphoma 06/01/2020   Malignant neoplasm of upper-outer quadrant of left breast in female, estrogen receptor positive (HCC) 05/26/2020   Acute right ankle pain 05/16/2016   Nondisplaced fracture of lateral malleolus of right fibula, initial encounter for closed fracture 05/16/2016    ONSET DATE: 09/24/23  REFERRING DIAG:  C79.31 (ICD-10-CM) - Secondary malignant neoplasm of brain    THERAPY DIAG:  No diagnosis found.  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 Last one was in October. I couldn't drive for 6 months. Have to go back next month for scans to check for brain activity and cancer. I feel good and better. The medicine is regulated now.  Pt accompanied by: self  PERTINENT HISTORY: hx of breast cancer and total masectomy, seizures, frontal craniotomy for tumor excision 11/10/21  PAIN:  Are you having pain? No  PRECAUTIONS: Fall  RED FLAGS: None   WEIGHT BEARING RESTRICTIONS: No  FALLS: Has patient fallen in last 6 months? No  LIVING ENVIRONMENT: Lives with: lives with their family Lives in: House/apartment Stairs: Yes: External: 3 steps; none Has following equipment at home: Single point cane  PLOF: Independent  PATIENT GOALS: want to be more aligned, I can feel my gait is off. Want to be more balanced and coordinated   OBJECTIVE:  Note: Objective measures were completed at Evaluation unless otherwise noted.  DIAGNOSTIC FINDINGS: 07/08/23 Brain: Encephalomalacia and a small amount of chronic blood products are again noted at the left frontoparietal resection site. A small amount of nonsuspicious enhancement at the resection  site is unchanged. No new enhancing intracranial lesion is identified. The ventricles are normal in size. No acute infarct, midline shift, or extra-axial fluid collection is evident.  Unchanged left frontoparietal resection site. No evidence of new or progressive intracranial metastatic disease.    COGNITION: Overall cognitive status: Within functional limits for tasks assessed   SENSATION: WFL  COORDINATION: Some decreased motor control on RLE    MUSCLE LENGTH:  Some tightness in R hip, BL hamstrings, and in R ankle   POSTURE:  rounded shoulders  LOWER EXTREMITY ROM:   all WFL, except R ankle DF still limited to 10d    LOWER EXTREMITY MMT:  grossly 5/5    FUNCTIONAL TESTS:   9 reps  30 seconds chair stand test Berg Balance Scale: 50/56  PATIENT SURVEYS:  ABC scale 65%                                                                                                                              TREATMENT DATE:  04/21/24 Elliptical Treadmill pushes  Side steps on treadmill 0.64mph On airex step up 6 Shoulder ext 15# Cable rows 15#  SLS On BOSU balance, then catch  04/15/24 Gait 3 laps around the back building up and down slope Seated Rows & Lats 45lb 2x12 Leg press 60lb 2x10 Shoulder abd 3lb 2x10. Standing rows 15lb 2x10  04/08/24 Elliptical L3 x 5 min Seated rows and lats 45lb 2x10  Shoulder Ext 15lb 2x10 Rows 15lb 2x10 Shoulder abd 3lb 2x10 SLS x 3 10'' each from floor   Unable to complete with RLE  Leg press 60lb 2x10 6in step ups x 5 each  03/31/24 Elliptical L3 x 5 min HS Curls 35lb 2x10 Leg Ext 10lb 2x10 6in forward and lateral step up x10 each Seated rows and lats 35lb 2x10  Shoulder Ext 10lb 2x10 Shoulder Flex and abd 2lb 2x10 Triceps Ext 25lb 2x15  03/02/24 Gait outside 3 laps nice pace no rest 35# rows 35# lats Black tband back extension 15# straight arm pulls 60# leg press On airex head turns On airex reaching  02/26/24 2 laps  around back building 4 way ankle 20 reps each Leg press 40# 2x10 Calf raises on press 2x15 Lat pull down 25# 2x10 Seated row 25# 2x10  02/12/24 Elliptical L4 x26mins  [facility] taps on airex different colors- unable to keep foot up in SL Resisted gait side steps over obstacles Leg ext 15# 2x10 HS curls 35# 2x10  Walking on beam Balance on BOSU, then reaching for numbers  Shoulder ext 15# 2x10 Cable rows 15# 2x10 4# flexion to abd 2x10   02/03/24 Elliptical level 4 x 6 minutes SLS LLE x10 sec, RLE 3 sec 30 second chair stand test 12,  still below her norm for age Seated Rows & Lats 35lb 2x15 Chest Press 20lb 2x12  Triceps Ext 25lb 2x15 6in step ups x10 each  S2S OHP yellow ball 2x10  Shoulder abd & Flex 3lb 2x10  01/29/24 Elliptical level 4 x 6 minutes Seated Rows & Lats 35lb 2x12 Shoulder Ext 10lb 2x10  Rows 15lb 2x10 Triceps Ext 35lb 2x15 Alt 6in box taps x 10  From airex x 10 S2S OHP yellow ball 2x10  Chest Press 15lb 2x12  Shoulder flex & abd 4lb 2x10 Biceps curls 8lb 2x10   01/21/24 30 secondchair stand test 12, improved from 9 but still below her norm for age Elliptical level 4 x 4 minutes  Walking ball toss Ball kicks On bosu reaching and reacting Rows 35# Lats 35# Chest press 15# 35# triceps 15# biceps 9# single arm overhead carry 20 # overhead carry both arms  01/14/24 Gait outside around the back building 20# seated row Lats 20# 10# straight arm pull 10# chest press  10# biceps  25# triceps 20# farmer carry 10# overhead single arm carry, then 10# with both arms overhead 5# UE circuit 5# sit to stand with overhead press   PATIENT EDUCATION: Education details: POC Person educated: Patient Education method: Explanation Education comprehension: verbalized understanding  HOME EXERCISE PROGRAM: Walking, SLS, doing some exercises with bands  Access Code: [email or web address] URL: [email or web address] Date: 10/22/2023 Prepared by: Almetta Fam  Exercises - Single Leg Stance  - 1 x daily - 7 x weekly - 2 sets - 10 hold - Tandem Stance  - 1 x daily - 7 x weekly - 2 sets - 10 reps - Sit to Stand  - 1 x daily - 7 x weekly - 2 sets - 10 reps - Heel Raises with Counter Support  - 1 x daily - 7 x weekly - 2 sets - 10 reps - Heel Toe Raises with Counter Support  - 1 x daily - 7 x weekly - 2 sets - 10 reps  GOALS: Goals reviewed with patient? Yes  SHORT TERM GOALS: Target date: 11/27/23  Patient will be independent with initial HEP. Baseline:  Goal status: met with walking program 11/05/23  2.  Patient will improve R ankle DF to 20d Baseline: 10d Goal status:  met 12/03/23    LONG TERM GOALS: Target date: 05/06/24  Patient will be independent with advanced/ongoing HEP to improve outcomes and carryover.  Baseline:  Goal status: progressing 01/21/24  2.  Patient will be able stand SLS on RLE >10s on firm and foam surfaces Baseline: 5s at best on firm  Goal status: progressing 01/21/24, ongoing 02/03/24  3.  Patient will be able to do at least 10 alternating taps on 6 in 20s  Baseline: 6 on 4 in 20s  Goal status: MET 11/19/23   4.  Patient will demonstrate 12 reps or more with 30s chair test Baseline: 9 Goal status: at 12 reps on 01/21/24, Met 02/03/24  5.  Patient will improve ABC confidence score to 75-80%  Baseline: 65%  Goal status: met 01/14/24  6. Patient will be able to do a light jog, small hops, and high level balance without LOB and fatigue  Goal status: ongoing 03/02/24, ongoing 04/08/24   ASSESSMENT:  CLINICAL IMPRESSION: Pt enters doing well. She continues to report issues with her balance. Pt tolerated additional laps outside the back building.  SLS remains difficult. Postural cues needed with seated rows. Will be leaving for a cruise in one week, she repots taking her electric scooter to get around.  Pt stated she wishes to increase her functional capacity without using scooter. Today she did well  completing all interventions.   EVAL Patient is a 48 y.o. female who was seen today for physical therapy evaluation and treatment for aftercare following brain surgery. Her surgery was 2 years ago but she began having complication recently and seizures. She still has decreased coordination with RLE, and unable to do equal movements with both sides. R ankle DF has returned significantly but still missing about 10d. Pt would like to increase her mobility and ability to do things with her RLE to feel more balance and coordinated. She will benefit from  PT to work on her overall strength, balance, and coordination to improve her confidence out in the community.   OBJECTIVE IMPAIRMENTS: Abnormal gait, decreased balance, decreased coordination, and decreased ROM.   ACTIVITY LIMITATIONS: locomotion level  PARTICIPATION LIMITATIONS: community activity and yard work  PERSONAL FACTORS: Fitness, Past/current experiences, and Time since onset of injury/illness/exacerbation are also affecting patient's functional outcome.   REHAB POTENTIAL: Good  CLINICAL DECISION MAKING: Stable/uncomplicated  EVALUATION COMPLEXITY: Low  PLAN:  PT FREQUENCY: 1x/week  PT DURATION: 12 weeks  PLANNED INTERVENTIONS: 97110-Therapeutic exercises, 97530- Therapeutic activity, [id number]- Neuromuscular re-education, 97535- Self Care, [id number]- Manual therapy, [phone]- Gait training, [phone]- Electrical stimulation (manual), [phone]- Ultrasound, Patient/Family education, Balance training, Stair training, Taping, Dry Needling, Joint mobilization, Spinal mobilization, Cryotherapy, and Moist heat  PLAN FOR NEXT SESSION: push strength, endurance, balance and functional gait, higher level activities for an overall higher quality of life She will go on a trip later next week so , would not push too hard at the next visit  Almetta Fam, PT 04/20/2024, 9:45 AM

## 2024-04-21 ENCOUNTER — Ambulatory Visit

## 2024-04-22 NOTE — Therapy (Signed)
 OUTPATIENT PHYSICAL THERAPY NEURO TREATMENT    Patient Name: Sheryl Mcmillan MRN: [id number] DOB:11/01/1975, 48 y.o., female Today's Date: 04/23/2024   PCP: Greig Drones REFERRING PROVIDER: [name]  END OF SESSION:  PT End of Session - 04/23/24 1458     Visit Number 22    Date for Recertification  06/25/24    PT Start Time 1455    PT Stop Time 1535    PT Time Calculation (min) 40 min    Activity Tolerance Patient tolerated treatment well    Behavior During Therapy [facility] for tasks assessed/performed                   Past Medical History:  Diagnosis Date   Arthritis    Breast cancer (HCC)    Family history of non-Hodgkin's lymphoma 06/01/2020   GERD (gastroesophageal reflux disease)    Hemorrhoids    Migraines    PCOS (polycystic ovarian syndrome)    PONV (postoperative nausea and vomiting)    Seizures (HCC)    Squamous cell carcinoma of skin    Past Surgical History:  Procedure Laterality Date   APPLICATION OF CRANIAL NAVIGATION Left 11/10/2021   Procedure: APPLICATION OF CRANIAL NAVIGATION;  Surgeon: [name], MD;  Location: [facility] OR;  Service: Neurosurgery;  Laterality: Left;   AXILLARY SENTINEL NODE BIOPSY Left 11/07/2020   Procedure: LEFT AXILLARY SENTINEL NODE BIOPSY;  Surgeon: [name], MD;  Location: [facility] OR;  Service: General;  Laterality: Left;   BREAST CYST EXCISION Right 09/22/2021   Procedure: Excision of right axillary excess soft tissue.;  Surgeon: [name], MD;  Location: [facility];  Service: Plastics;  Laterality: Right;   BREAST RECONSTRUCTION WITH PLACEMENT OF TISSUE EXPANDER AND FLEX HD (ACELLULAR HYDRATED DERMIS) Bilateral 11/07/2020   Procedure: BILATERAL BREAST RECONSTRUCTION WITH PLACEMENT OF TISSUE EXPANDER AND FLEX HD (ACELLULAR HYDRATED DERMIS);  Surgeon: [name], MD;  Location: [facility] OR;  Service: Plastics;  Laterality: Bilateral;   COSMETIC SURGERY     CRANIOTOMY Left 11/10/2021    Procedure: LT FRONTAL CRANIOTOMY TUMOR EXCISION;  Surgeon: [name], MD;  Location: [facility] OR;  Service: Neurosurgery;  Laterality: Left;   DILATION AND CURETTAGE OF UTERUS     HEMORRHOID SURGERY     IR IMAGING GUIDED PORT INSERTION  06/15/2020   LIPOSUCTION WITH LIPOFILLING Bilateral 09/22/2021   Procedure: Fat grafting bilateral breast;  Surgeon: [name], MD;  Location: [facility];  Service: Plastics;  Laterality: Bilateral;   MASS EXCISION Bilateral 04/25/2021   Procedure: excision of bilateral axillary breast tissue;  Surgeon: [name], MD;  Location: [facility];  Service: Plastics;  Laterality: Bilateral;   PORT-A-CATH REMOVAL Right 11/07/2020   Procedure: REMOVAL PORT-A-CATH;  Surgeon: [name], MD;  Location: [facility] OR;  Service: General;  Laterality: Right;   REMOVAL OF BILATERAL TISSUE EXPANDERS WITH PLACEMENT OF BILATERAL BREAST IMPLANTS Bilateral 04/25/2021   Procedure: REMOVAL OF BILATERAL TISSUE EXPANDERS WITH PLACEMENT OF BILATERAL BREAST IMPLANTS;  Surgeon: [name], MD;  Location: [facility];  Service: Plastics;  Laterality: Bilateral;   ROBOTIC ASSISTED TOTAL HYSTERECTOMY WITH BILATERAL SALPINGO OOPHERECTOMY Bilateral 03/14/2021   Procedure: XI ROBOTIC ASSISTED TOTAL HYSTERECTOMY WITH BILATERAL SALPINGO OOPHORECTOMY;  Surgeon: [name], MD;  Location: [facility];  Service: Gynecology;  Laterality: Bilateral;   TOTAL MASTECTOMY Bilateral 11/07/2020   Procedure: BILATERAL MASTECTOMY;  Surgeon: [name], MD;  Location: [facility] OR;  Service: General;  Laterality: Bilateral;   Patient Active Problem List   Diagnosis Date Noted   Leukocytosis 04/12/2023   Hyperlipidemia 04/11/2023   Seizure (HCC) 04/11/2023   Brain tumor (HCC) 11/08/2021   Metastatic cancer to brain (HCC) 11/08/2021   Cerebral edema (HCC) 11/08/2021   Complex partial seizure (HCC) 11/08/2021   Brain mass 11/07/2021   Right sided  weakness 11/07/2021   IDA (iron deficiency anemia) 07/18/2021   Cholelithiasis 03/14/2021   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   Obesity, Class III, BMI 40-49.9 (morbid obesity) (HCC) 07/19/2020   BRCA1 gene mutation positive 07/04/2020   Genetic testing 06/28/2020   Family history of non-Hodgkin's lymphoma 06/01/2020   Malignant neoplasm of upper-outer quadrant of left breast in female, estrogen receptor positive (HCC) 05/26/2020   Acute right ankle pain 05/16/2016   Nondisplaced fracture of lateral malleolus of right fibula, initial encounter for closed fracture 05/16/2016    ONSET DATE: 09/24/23  REFERRING DIAG:  C79.31 (ICD-10-CM) - Secondary malignant neoplasm of brain    THERAPY DIAG:  Unsteadiness on feet  Muscle weakness (generalized)  Other lack of coordination  Difficulty in walking, not elsewhere classified  Rationale for Evaluation and Treatment: Rehabilitation  SUBJECTIVE:                                                                                                                                                                                             SUBJECTIVE STATEMENT: Good, I'm feeling good    EVAL: I started having seizures, 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 Last one was in October. I couldn't drive for 6 months. Have to go back next month for scans to check for brain activity and cancer. I feel good and better. The medicine is regulated now.  Pt accompanied by: self  PERTINENT HISTORY: hx of breast cancer and total masectomy, seizures, frontal craniotomy for tumor excision 11/10/21  PAIN:  Are you having pain? No  PRECAUTIONS: Fall  RED FLAGS: None   WEIGHT BEARING RESTRICTIONS: No  FALLS: Has patient fallen in last 6 months? No  LIVING ENVIRONMENT: Lives with: lives with their family Lives in: House/apartment Stairs: Yes: External: 3 steps; none Has following equipment at home: Single point cane  PLOF:  Independent  PATIENT GOALS: want to be more aligned, I can feel my gait is off. Want to be more balanced and coordinated   OBJECTIVE:  Note: Objective measures were completed at Evaluation unless otherwise noted.  DIAGNOSTIC FINDINGS: 07/08/23 Brain: Encephalomalacia and a small amount of chronic blood products are again noted at the left frontoparietal resection site. A small amount of nonsuspicious enhancement at  the resection site is unchanged. No new enhancing intracranial lesion is identified. The ventricles are normal in size. No acute infarct, midline shift, or extra-axial fluid collection is evident.  Unchanged left frontoparietal resection site. No evidence of new or progressive intracranial metastatic disease.    COGNITION: Overall cognitive status: Within functional limits for tasks assessed   SENSATION: WFL  COORDINATION: Some decreased motor control on RLE    MUSCLE LENGTH:  Some tightness in R hip, BL hamstrings, and in R ankle   POSTURE: rounded shoulders  LOWER EXTREMITY ROM:   all WFL, except R ankle DF still limited to 10d    LOWER EXTREMITY MMT:  grossly 5/5    FUNCTIONAL TESTS:   9 reps  30 seconds chair stand test Berg Balance Scale: 50/56  PATIENT SURVEYS:  ABC scale 65%                                                                                                                              TREATMENT DATE:  04/23/24 Walk outdoors 2 laps  SLS 8s LLE, 4s on RLE  Tapping to different targets  Resisted side steps over obstacles 30# 4 way x4 Seated row and lat pulls 35# 2x10  04/15/24 Gait 3 laps around the back building up and down slope Seated Rows & Lats 45lb 2x12 Leg press 60lb 2x10 Shoulder abd 3lb 2x10. Standing rows 15lb 2x10  04/08/24 Elliptical L3 x 5 min Seated rows and lats 45lb 2x10  Shoulder Ext 15lb 2x10 Rows 15lb 2x10 Shoulder abd 3lb 2x10 SLS x 3 10'' each from floor   Unable to complete with RLE  Leg press 60lb  2x10 6in step ups x 5 each  03/31/24 Elliptical L3 x 5 min HS Curls 35lb 2x10 Leg Ext 10lb 2x10 6in forward and lateral step up x10 each Seated rows and lats 35lb 2x10  Shoulder Ext 10lb 2x10 Shoulder Flex and abd 2lb 2x10 Triceps Ext 25lb 2x15  03/02/24 Gait outside 3 laps nice pace no rest 35# rows 35# lats Black tband back extension 15# straight arm pulls 60# leg press On airex head turns On airex reaching  02/26/24 2 laps around back building 4 way ankle 20 reps each Leg press 40# 2x10 Calf raises on press 2x15 Lat pull down 25# 2x10 Seated row 25# 2x10  02/12/24 Elliptical L4 x24mins  Cone taps on airex different colors- unable to keep foot up in SL Resisted gait side steps over obstacles Leg ext 15# 2x10 HS curls 35# 2x10  Walking on beam Balance on BOSU, then reaching for numbers  Shoulder ext 15# 2x10 Cable rows 15# 2x10 4# flexion to abd 2x10   02/03/24 Elliptical level 4 x 6 minutes SLS LLE x10 sec, RLE 3 sec 30 second chair stand test 12,  still below her norm for age Seated Rows & Lats 35lb 2x15 Chest Press 20lb 2x12  Triceps Ext 25lb 2x15 6in step ups x10 each  S2S OHP yellow ball 2x10  Shoulder abd & Flex 3lb 2x10  01/29/24 Elliptical level 4 x 6 minutes Seated Rows & Lats 35lb 2x12 Shoulder Ext 10lb 2x10  Rows 15lb 2x10 Triceps Ext 35lb 2x15 Alt 6in box taps x 10  From airex x 10 S2S OHP yellow ball 2x10  Chest Press 15lb 2x12  Shoulder flex & abd 4lb 2x10 Biceps curls 8lb 2x10   01/21/24 30 secondchair stand test 12, improved from 9 but still below her norm for age Elliptical level 4 x 4 minutes Walking ball toss Ball kicks On bosu reaching and reacting Rows 35# Lats 35# Chest press 15# 35# triceps 15# biceps 9# single arm overhead carry 20 # overhead carry both arms  01/14/24 Gait outside around the back building 20# seated row Lats 20# 10# straight arm pull 10# chest press  10# biceps  25# triceps 20# farmer carry 10#  overhead single arm carry, then 10# with both arms overhead 5# UE circuit 5# sit to stand with overhead press   PATIENT EDUCATION: Education details: POC Person educated: Patient Education method: Explanation Education comprehension: verbalized understanding  HOME EXERCISE PROGRAM: Walking, SLS, doing some exercises with bands  Access Code: [email or web address] URL: [email or web address] Date: 10/22/2023 Prepared by: Almetta Fam  Exercises - Single Leg Stance  - 1 x daily - 7 x weekly - 2 sets - 10 hold - Tandem Stance  - 1 x daily - 7 x weekly - 2 sets - 10 reps - Sit to Stand  - 1 x daily - 7 x weekly - 2 sets - 10 reps - Heel Raises with Counter Support  - 1 x daily - 7 x weekly - 2 sets - 10 reps - Heel Toe Raises with Counter Support  - 1 x daily - 7 x weekly - 2 sets - 10 reps  GOALS: Goals reviewed with patient? Yes  SHORT TERM GOALS: Target date: 11/27/23  Patient will be independent with initial HEP. Baseline:  Goal status: met with walking program 11/05/23  2.  Patient will improve R ankle DF to 20d Baseline: 10d Goal status:  met 12/03/23    LONG TERM GOALS: Target date:   Patient will be independent with advanced/ongoing HEP to improve outcomes and carryover.  Baseline:  Goal status: progressing 01/21/24  2.  Patient will be able stand SLS on RLE >10s on firm and foam surfaces Baseline: 5s at best on firm  Goal status: progressing 01/21/24, ongoing 02/03/24, RLE 4s LLE 8s 04/23/24  3.  Patient will be able to do at least 10 alternating taps on 6 in 20s  Baseline: 6 on 4 in 20s  Goal status: MET 11/19/23   4.  Patient will demonstrate 12 reps or more with 30s chair test Baseline: 9 Goal status: at 12 reps on 01/21/24, Met 02/03/24  5.  Patient will improve ABC confidence score to 75-80%  Baseline: 65%  Goal status: met 01/14/24  6. Patient will be able to do a light jog, small hops, and high level balance without LOB and fatigue  Goal status:  ongoing 03/02/24, ongoing 04/08/24, 04/23/24   ASSESSMENT:  CLINICAL IMPRESSION: Pt enters doing well. SLS remains difficult. Will recert for her to complete her remaining 5 visits. Is leaving for her cruise tomorrow. Today she did well completing all interventions. Was able to do balance with SLS cone taps today with success.   EVAL Patient is a 48 y.o. female who was seen today for physical therapy  evaluation and treatment for aftercare following brain surgery. Her surgery was 2 years ago but she began having complication recently and seizures. She still has decreased coordination with RLE, and unable to do equal movements with both sides. R ankle DF has returned significantly but still missing about 10d. Pt would like to increase her mobility and ability to do things with her RLE to feel more balance and coordinated. She will benefit from PT to work on her overall strength, balance, and coordination to improve her confidence out in the community.   OBJECTIVE IMPAIRMENTS: Abnormal gait, decreased balance, decreased coordination, and decreased ROM.   ACTIVITY LIMITATIONS: locomotion level  PARTICIPATION LIMITATIONS: community activity and yard work  PERSONAL FACTORS: Fitness, Past/current experiences, and Time since onset of injury/illness/exacerbation are also affecting patient's functional outcome.   REHAB POTENTIAL: Good  CLINICAL DECISION MAKING: Stable/uncomplicated  EVALUATION COMPLEXITY: Low  PLAN:  PT FREQUENCY: 1x/week  PT DURATION: 12 weeks  PLANNED INTERVENTIONS: 97110-Therapeutic exercises, 97530- Therapeutic activity, [id number]- Neuromuscular re-education, 97535- Self Care, [id number]- Manual therapy, [phone]- Gait training, [phone]- Electrical stimulation (manual), [phone]- Ultrasound, Patient/Family education, Balance training, Stair training, Taping, Dry Needling, Joint mobilization, Spinal mobilization, Cryotherapy, and Moist heat  PLAN FOR NEXT SESSION: push strength, endurance,  balance and functional gait, higher level activities for an overall higher quality of life She will go on a trip later next week so , would not push too hard at the next visit  Almetta Fam, PT 04/23/2024, 3:35 PM

## 2024-04-23 ENCOUNTER — Ambulatory Visit

## 2024-04-23 DIAGNOSIS — R262 Difficulty in walking, not elsewhere classified: Secondary | ICD-10-CM

## 2024-04-23 DIAGNOSIS — R2681 Unsteadiness on feet: Secondary | ICD-10-CM | POA: Diagnosis not present

## 2024-04-23 DIAGNOSIS — M6281 Muscle weakness (generalized): Secondary | ICD-10-CM

## 2024-04-23 DIAGNOSIS — R278 Other lack of coordination: Secondary | ICD-10-CM

## 2024-05-04 ENCOUNTER — Encounter: Payer: Self-pay | Admitting: Hematology and Oncology

## 2024-05-14 ENCOUNTER — Ambulatory Visit: Attending: Neurosurgery

## 2024-05-14 DIAGNOSIS — R278 Other lack of coordination: Secondary | ICD-10-CM | POA: Diagnosis present

## 2024-05-14 DIAGNOSIS — R2681 Unsteadiness on feet: Secondary | ICD-10-CM | POA: Diagnosis present

## 2024-05-14 DIAGNOSIS — M6281 Muscle weakness (generalized): Secondary | ICD-10-CM | POA: Insufficient documentation

## 2024-05-14 NOTE — Therapy (Signed)
 OUTPATIENT PHYSICAL THERAPY NEURO TREATMENT    Patient Name: Sheryl Mcmillan MRN: [id number] DOB:1976-06-10, 48 y.o., female Today's Date: 05/14/2024   PCP: Greig Drones REFERRING PROVIDER: [name]  END OF SESSION:  PT End of Session - 05/14/24 1542     Visit Number 23    Date for Recertification  06/25/24    PT Start Time 1543    PT Stop Time 1625    PT Time Calculation (min) 42 min    Activity Tolerance Patient tolerated treatment well    Behavior During Therapy [facility] for tasks assessed/performed                    Past Medical History:  Diagnosis Date   Arthritis    Breast cancer (HCC)    Family history of non-Hodgkin's lymphoma 06/01/2020   GERD (gastroesophageal reflux disease)    Hemorrhoids    Migraines    PCOS (polycystic ovarian syndrome)    PONV (postoperative nausea and vomiting)    Seizures (HCC)    Squamous cell carcinoma of skin    Past Surgical History:  Procedure Laterality Date   APPLICATION OF CRANIAL NAVIGATION Left 11/10/2021   Procedure: APPLICATION OF CRANIAL NAVIGATION;  Surgeon: [name], MD;  Location: [facility] OR;  Service: Neurosurgery;  Laterality: Left;   AXILLARY SENTINEL NODE BIOPSY Left 11/07/2020   Procedure: LEFT AXILLARY SENTINEL NODE BIOPSY;  Surgeon: [name], MD;  Location: [facility];  Service: General;  Laterality: Left;   BREAST CYST EXCISION Right 09/22/2021   Procedure: Excision of right axillary excess soft tissue.;  Surgeon: [name], MD;  Location: [facility];  Service: Plastics;  Laterality: Right;   BREAST RECONSTRUCTION WITH PLACEMENT OF TISSUE EXPANDER AND FLEX HD (ACELLULAR HYDRATED DERMIS) Bilateral 11/07/2020   Procedure: BILATERAL BREAST RECONSTRUCTION WITH PLACEMENT OF TISSUE EXPANDER AND FLEX HD (ACELLULAR HYDRATED DERMIS);  Surgeon: [name], MD;  Location: [facility];  Service: Plastics;  Laterality: Bilateral;   COSMETIC SURGERY     CRANIOTOMY Left 11/10/2021    Procedure: LT FRONTAL CRANIOTOMY TUMOR EXCISION;  Surgeon: [name], MD;  Location: [facility] OR;  Service: Neurosurgery;  Laterality: Left;   DILATION AND CURETTAGE OF UTERUS     HEMORRHOID SURGERY     IR IMAGING GUIDED PORT INSERTION  06/15/2020   LIPOSUCTION WITH LIPOFILLING Bilateral 09/22/2021   Procedure: Fat grafting bilateral breast;  Surgeon: [name], MD;  Location: [facility];  Service: Plastics;  Laterality: Bilateral;   MASS EXCISION Bilateral 04/25/2021   Procedure: excision of bilateral axillary breast tissue;  Surgeon: [name], MD;  Location: [facility];  Service: Plastics;  Laterality: Bilateral;   PORT-A-CATH REMOVAL Right 11/07/2020   Procedure: REMOVAL PORT-A-CATH;  Surgeon: [name], MD;  Location: [facility] OR;  Service: General;  Laterality: Right;   REMOVAL OF BILATERAL TISSUE EXPANDERS WITH PLACEMENT OF BILATERAL BREAST IMPLANTS Bilateral 04/25/2021   Procedure: REMOVAL OF BILATERAL TISSUE EXPANDERS WITH PLACEMENT OF BILATERAL BREAST IMPLANTS;  Surgeon: [name], MD;  Location:  SURGERY CENTER;  Service: Plastics;  Laterality: Bilateral;   ROBOTIC ASSISTED TOTAL HYSTERECTOMY WITH BILATERAL SALPINGO OOPHERECTOMY Bilateral 03/14/2021   Procedure: XI ROBOTIC ASSISTED TOTAL HYSTERECTOMY WITH BILATERAL SALPINGO OOPHORECTOMY;  Surgeon: [name], MD;  Location: [facility];  Service: Gynecology;  Laterality: Bilateral;   TOTAL MASTECTOMY Bilateral 11/07/2020   Procedure: BILATERAL MASTECTOMY;  Surgeon: [name], MD;  Location: [facility] OR;  Service: General;  Laterality: Bilateral;   Patient Active Problem List   Diagnosis Date Noted   Leukocytosis 04/12/2023   Hyperlipidemia 04/11/2023   Seizure (HCC) 04/11/2023   Brain tumor (HCC) 11/08/2021   Metastatic cancer to brain (HCC) 11/08/2021   Cerebral edema (HCC) 11/08/2021   Complex partial seizure (HCC) 11/08/2021   Brain mass 11/07/2021   Right sided  weakness 11/07/2021   IDA (iron deficiency anemia) 07/18/2021   Cholelithiasis 03/14/2021   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   Obesity, Class III, BMI 40-49.9 (morbid obesity) (HCC) 07/19/2020   BRCA1 gene mutation positive 07/04/2020   Genetic testing 06/28/2020   Family history of non-Hodgkin's lymphoma 06/01/2020   Malignant neoplasm of upper-outer quadrant of left breast in female, estrogen receptor positive (HCC) 05/26/2020   Acute right ankle pain 05/16/2016   Nondisplaced fracture of lateral malleolus of right fibula, initial encounter for closed fracture 05/16/2016    ONSET DATE: 09/24/23  REFERRING DIAG:  C79.31 (ICD-10-CM) - Secondary malignant neoplasm of brain    THERAPY DIAG:  Unsteadiness on feet  Muscle weakness (generalized)  Other lack of coordination  Rationale for Evaluation and Treatment: Rehabilitation  SUBJECTIVE:                                                                                                                                                                                             SUBJECTIVE STATEMENT: I am good. A little tired.     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 Last one was in October. I couldn't drive for 6 months. Have to go back next month for scans to check for brain activity and cancer. I feel good and better. The medicine is regulated now.  Pt accompanied by: self  PERTINENT HISTORY: hx of breast cancer and total masectomy, seizures, frontal craniotomy for tumor excision 11/10/21  PAIN:  Are you having pain? No  PRECAUTIONS: Fall  RED FLAGS: None   WEIGHT BEARING RESTRICTIONS: No  FALLS: Has patient fallen in last 6 months? No  LIVING ENVIRONMENT: Lives with: lives with their family Lives in: House/apartment Stairs: Yes: External: 3 steps; none Has following equipment at home: Single point cane  PLOF: Independent  PATIENT GOALS: want to be more aligned, I can  feel my gait is off. Want to be more balanced and coordinated   OBJECTIVE:  Note: Objective measures were completed at Evaluation unless otherwise noted.  DIAGNOSTIC FINDINGS: 07/08/23 Brain: Encephalomalacia and a small amount of chronic blood products are again noted at the left frontoparietal resection site. A small amount of nonsuspicious enhancement at the resection site is  unchanged. No new enhancing intracranial lesion is identified. The ventricles are normal in size. No acute infarct, midline shift, or extra-axial fluid collection is evident.  Unchanged left frontoparietal resection site. No evidence of new or progressive intracranial metastatic disease.    COGNITION: Overall cognitive status: Within functional limits for tasks assessed   SENSATION: WFL  COORDINATION: Some decreased motor control on RLE    MUSCLE LENGTH:  Some tightness in R hip, BL hamstrings, and in R ankle   POSTURE: rounded shoulders  LOWER EXTREMITY ROM:   all WFL, except R ankle DF still limited to 10d    LOWER EXTREMITY MMT:  grossly 5/5    FUNCTIONAL TESTS:   9 reps  30 seconds chair stand test Berg Balance Scale: 50/56  PATIENT SURVEYS:  ABC scale 65%                                                                                                                              TREATMENT DATE:  05/14/24 Elliptical L2 x76mins  Side steps on treadmill 0.5 mph Step overs 4 way  Rockerboard  Step ups on BOSU Catch on BOSU On airex shoulder ext 10# 2x10  04/23/24 Walk outdoors 2 laps  SLS 8s LLE, 4s on RLE  Tapping to different targets  Resisted side steps over obstacles 30# 4 way x4 Seated row and lat pulls 35# 2x10  04/15/24 Gait 3 laps around the back building up and down slope Seated Rows & Lats 45lb 2x12 Leg press 60lb 2x10 Shoulder abd 3lb 2x10. Standing rows 15lb 2x10  04/08/24 Elliptical L3 x 5 min Seated rows and lats 45lb 2x10  Shoulder Ext 15lb 2x10 Rows 15lb  2x10 Shoulder abd 3lb 2x10 SLS x 3 10'' each from floor   Unable to complete with RLE  Leg press 60lb 2x10 6in step ups x 5 each  03/31/24 Elliptical L3 x 5 min HS Curls 35lb 2x10 Leg Ext 10lb 2x10 6in forward and lateral step up x10 each Seated rows and lats 35lb 2x10  Shoulder Ext 10lb 2x10 Shoulder Flex and abd 2lb 2x10 Triceps Ext 25lb 2x15  03/02/24 Gait outside 3 laps nice pace no rest 35# rows 35# lats Black tband back extension 15# straight arm pulls 60# leg press On airex head turns On airex reaching  02/26/24 2 laps around back building 4 way ankle 20 reps each Leg press 40# 2x10 Calf raises on press 2x15 Lat pull down 25# 2x10 Seated row 25# 2x10  02/12/24 Elliptical L4 x33mins  Cone taps on airex different colors- unable to keep foot up in SL Resisted gait side steps over obstacles Leg ext 15# 2x10 HS curls 35# 2x10  Walking on beam Balance on BOSU, then reaching for numbers  Shoulder ext 15# 2x10 Cable rows 15# 2x10 4# flexion to abd 2x10   02/03/24 Elliptical level 4 x 6 minutes SLS LLE x10 sec, RLE 3 sec 30 second chair stand test  12,  still below her norm for age Seated Rows & Lats 35lb 2x15 Chest Press 20lb 2x12  Triceps Ext 25lb 2x15 6in step ups x10 each  S2S OHP yellow ball 2x10  Shoulder abd & Flex 3lb 2x10  01/29/24 Elliptical level 4 x 6 minutes Seated Rows & Lats 35lb 2x12 Shoulder Ext 10lb 2x10  Rows 15lb 2x10 Triceps Ext 35lb 2x15 Alt 6in box taps x 10  From airex x 10 S2S OHP yellow ball 2x10  Chest Press 15lb 2x12  Shoulder flex & abd 4lb 2x10 Biceps curls 8lb 2x10   01/21/24 30 secondchair stand test 12, improved from 9 but still below her norm for age Elliptical level 4 x 4 minutes Walking ball toss Ball kicks On bosu reaching and reacting Rows 35# Lats 35# Chest press 15# 35# triceps 15# biceps 9# single arm overhead carry 20 # overhead carry both arms  01/14/24 Gait outside around the back building 20# seated  row Lats 20# 10# straight arm pull 10# chest press  10# biceps  25# triceps 20# farmer carry 10# overhead single arm carry, then 10# with both arms overhead 5# UE circuit 5# sit to stand with overhead press   PATIENT EDUCATION: Education details: POC Person educated: Patient Education method: Explanation Education comprehension: verbalized understanding  HOME EXERCISE PROGRAM: Walking, SLS, doing some exercises with bands  Access Code: [email or web address] URL: [email or web address] Date: 10/22/2023 Prepared by: Almetta Fam  Exercises - Single Leg Stance  - 1 x daily - 7 x weekly - 2 sets - 10 hold - Tandem Stance  - 1 x daily - 7 x weekly - 2 sets - 10 reps - Sit to Stand  - 1 x daily - 7 x weekly - 2 sets - 10 reps - Heel Raises with Counter Support  - 1 x daily - 7 x weekly - 2 sets - 10 reps - Heel Toe Raises with Counter Support  - 1 x daily - 7 x weekly - 2 sets - 10 reps  GOALS: Goals reviewed with patient? Yes  SHORT TERM GOALS: Target date: 11/27/23  Patient will be independent with initial HEP. Baseline:  Goal status: met with walking program 11/05/23  2.  Patient will improve R ankle DF to 20d Baseline: 10d Goal status:  met 12/03/23    LONG TERM GOALS: Target date: 06/25/24  Patient will be independent with advanced/ongoing HEP to improve outcomes and carryover.  Baseline:  Goal status: progressing 01/21/24  2.  Patient will be able stand SLS on RLE >10s on firm and foam surfaces Baseline: 5s at best on firm  Goal status: progressing 01/21/24, ongoing 02/03/24, RLE 4s LLE 8s 04/23/24, RLE 5s LLE 10s on firm 05/14/24  3.  Patient will be able to do at least 10 alternating taps on 6 in 20s  Baseline: 6 on 4 in 20s  Goal status: MET 11/19/23   4.  Patient will demonstrate 12 reps or more with 30s chair test Baseline: 9 Goal status: at 12 reps on 01/21/24, Met 02/03/24  5.  Patient will improve ABC confidence score to 75-80%  Baseline: 65%  Goal  status: met 01/14/24  6. Patient will be able to do a light jog, small hops, and high level balance without LOB and fatigue  Goal status: ongoing 03/02/24, ongoing 04/08/24, 04/23/24, ongoing 05/14/24   ASSESSMENT:  CLINICAL IMPRESSION: Patient returns from a cruise trip. She reports being tired from this and had some increased foot drop on the  R side with more activity. Today the R foot does have increased difficulty with clearance.   Pt enters doing well. SLS remains difficult. Will recert for her to complete her remaining 5 visits. Is leaving for her cruise tomorrow. Today she did well completing all interventions. Was able to do balance with SLS cone taps today with success.   EVAL Patient is a 48 y.o. female who was seen today for physical therapy evaluation and treatment for aftercare following brain surgery. Her surgery was 2 years ago but she began having complication recently and seizures. She still has decreased coordination with RLE, and unable to do equal movements with both sides. R ankle DF has returned significantly but still missing about 10d. Pt would like to increase her mobility and ability to do things with her RLE to feel more balance and coordinated. She will benefit from PT to work on her overall strength, balance, and coordination to improve her confidence out in the community.   OBJECTIVE IMPAIRMENTS: Abnormal gait, decreased balance, decreased coordination, and decreased ROM.   ACTIVITY LIMITATIONS: locomotion level  PARTICIPATION LIMITATIONS: community activity and yard work  PERSONAL FACTORS: Fitness, Past/current experiences, and Time since onset of injury/illness/exacerbation are also affecting patient's functional outcome.   REHAB POTENTIAL: Good  CLINICAL DECISION MAKING: Stable/uncomplicated  EVALUATION COMPLEXITY: Low  PLAN:  PT FREQUENCY: 1x/week  PT DURATION: 12 weeks  PLANNED INTERVENTIONS: 97110-Therapeutic exercises, 97530- Therapeutic activity,  [id number]- Neuromuscular re-education, 97535- Self Care, [id number]- Manual therapy, [phone]- Gait training, [phone]- Electrical stimulation (manual), [phone]- Ultrasound, Patient/Family education, Balance training, Stair training, Taping, Dry Needling, Joint mobilization, Spinal mobilization, Cryotherapy, and Moist heat  PLAN FOR NEXT SESSION: push strength, endurance, balance and functional gait, higher level activities for an overall higher quality of life She will go on a trip later next week so , would not push too hard at the next visit  Almetta Fam, PT 05/14/2024, 4:29 PM

## 2024-05-18 ENCOUNTER — Other Ambulatory Visit

## 2024-05-20 ENCOUNTER — Ambulatory Visit

## 2024-05-20 NOTE — Therapy (Incomplete)
 OUTPATIENT PHYSICAL THERAPY NEURO TREATMENT    Patient Name: Sheryl Mcmillan MRN: [id number] DOB:03-26-1976, 48 y.o., female Today's Date: 05/20/2024   PCP: Greig Drones REFERRING PROVIDER: [name]  END OF SESSION:              Past Medical History:  Diagnosis Date   Arthritis    Breast cancer (HCC)    Family history of non-Hodgkin's lymphoma 06/01/2020   GERD (gastroesophageal reflux disease)    Hemorrhoids    Migraines    PCOS (polycystic ovarian syndrome)    PONV (postoperative nausea and vomiting)    Seizures (HCC)    Squamous cell carcinoma of skin    Past Surgical History:  Procedure Laterality Date   APPLICATION OF CRANIAL NAVIGATION Left 11/10/2021   Procedure: APPLICATION OF CRANIAL NAVIGATION;  Surgeon: [name], MD;  Location: [facility] OR;  Service: Neurosurgery;  Laterality: Left;   AXILLARY SENTINEL NODE BIOPSY Left 11/07/2020   Procedure: LEFT AXILLARY SENTINEL NODE BIOPSY;  Surgeon: [name], MD;  Location: [facility] OR;  Service: General;  Laterality: Left;   BREAST CYST EXCISION Right 09/22/2021   Procedure: Excision of right axillary excess soft tissue.;  Surgeon: [name], MD;  Location: [facility];  Service: Plastics;  Laterality: Right;   BREAST RECONSTRUCTION WITH PLACEMENT OF TISSUE EXPANDER AND FLEX HD (ACELLULAR HYDRATED DERMIS) Bilateral 11/07/2020   Procedure: BILATERAL BREAST RECONSTRUCTION WITH PLACEMENT OF TISSUE EXPANDER AND FLEX HD (ACELLULAR HYDRATED DERMIS);  Surgeon: [name], MD;  Location: [facility] OR;  Service: Plastics;  Laterality: Bilateral;   COSMETIC SURGERY     CRANIOTOMY Left 11/10/2021   Procedure: LT FRONTAL CRANIOTOMY TUMOR EXCISION;  Surgeon: [name], MD;  Location: [facility] OR;  Service: Neurosurgery;  Laterality: Left;   DILATION AND CURETTAGE OF UTERUS     HEMORRHOID SURGERY     IR IMAGING GUIDED PORT INSERTION  06/15/2020   LIPOSUCTION WITH LIPOFILLING Bilateral 09/22/2021    Procedure: Fat grafting bilateral breast;  Surgeon: [name], MD;  Location: [facility];  Service: Plastics;  Laterality: Bilateral;   MASS EXCISION Bilateral 04/25/2021   Procedure: excision of bilateral axillary breast tissue;  Surgeon: [name], MD;  Location: [facility];  Service: Plastics;  Laterality: Bilateral;   PORT-A-CATH REMOVAL Right 11/07/2020   Procedure: REMOVAL PORT-A-CATH;  Surgeon: [name], MD;  Location: [facility] OR;  Service: General;  Laterality: Right;   REMOVAL OF BILATERAL TISSUE EXPANDERS WITH PLACEMENT OF BILATERAL BREAST IMPLANTS Bilateral 04/25/2021   Procedure: REMOVAL OF BILATERAL TISSUE EXPANDERS WITH PLACEMENT OF BILATERAL BREAST IMPLANTS;  Surgeon: [name], MD;  Location: [facility];  Service: Plastics;  Laterality: Bilateral;   ROBOTIC ASSISTED TOTAL HYSTERECTOMY WITH BILATERAL SALPINGO OOPHERECTOMY Bilateral 03/14/2021   Procedure: XI ROBOTIC ASSISTED TOTAL HYSTERECTOMY WITH BILATERAL SALPINGO OOPHORECTOMY;  Surgeon: [name], MD;  Location: [facility];  Service: Gynecology;  Laterality: Bilateral;   TOTAL MASTECTOMY Bilateral 11/07/2020   Procedure: BILATERAL MASTECTOMY;  Surgeon: [name], MD;  Location: [facility] OR;  Service: General;  Laterality: Bilateral;   Patient Active Problem List   Diagnosis Date Noted   Leukocytosis 04/12/2023   Hyperlipidemia 04/11/2023   Seizure (HCC) 04/11/2023   Brain tumor (HCC) 11/08/2021   Metastatic cancer to brain (HCC) 11/08/2021   Cerebral edema (HCC) 11/08/2021   Complex partial seizure (HCC) 11/08/2021   Brain mass 11/07/2021   Right sided weakness 11/07/2021   IDA (iron deficiency anemia)  07/18/2021   Cholelithiasis 03/14/2021   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   Obesity, Class III, BMI 40-49.9 (morbid obesity) (HCC) 07/19/2020   BRCA1 gene mutation positive 07/04/2020   Genetic testing 06/28/2020   Family history of  non-Hodgkin's lymphoma 06/01/2020   Malignant neoplasm of upper-outer quadrant of left breast in female, estrogen receptor positive (HCC) 05/26/2020   Acute right ankle pain 05/16/2016   Nondisplaced fracture of lateral malleolus of right fibula, initial encounter for closed fracture 05/16/2016    ONSET DATE: 09/24/23  REFERRING DIAG:  C79.31 (ICD-10-CM) - Secondary malignant neoplasm of brain    THERAPY DIAG:  No diagnosis found.  Rationale for Evaluation and Treatment: Rehabilitation  SUBJECTIVE:                                                                                                                                                                                             SUBJECTIVE STATEMENT: I am good. A little tired.     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 Last one was in October. I couldn't drive for 6 months. Have to go back next month for scans to check for brain activity and cancer. I feel good and better. The medicine is regulated now.  Pt accompanied by: self  PERTINENT HISTORY: hx of breast cancer and total masectomy, seizures, frontal craniotomy for tumor excision 11/10/21  PAIN:  Are you having pain? No  PRECAUTIONS: Fall  RED FLAGS: None   WEIGHT BEARING RESTRICTIONS: No  FALLS: Has patient fallen in last 6 months? No  LIVING ENVIRONMENT: Lives with: lives with their family Lives in: House/apartment Stairs: Yes: External: 3 steps; none Has following equipment at home: Single point cane  PLOF: Independent  PATIENT GOALS: want to be more aligned, I can feel my gait is off. Want to be more balanced and coordinated   OBJECTIVE:  Note: Objective measures were completed at Evaluation unless otherwise noted.  DIAGNOSTIC FINDINGS: 07/08/23 Brain: Encephalomalacia and a small amount of chronic blood products are again noted at the left frontoparietal resection site. A small amount of nonsuspicious enhancement at the resection  site is unchanged. No new enhancing intracranial lesion is identified. The ventricles are normal in size. No acute infarct, midline shift, or extra-axial fluid collection is evident.  Unchanged left frontoparietal resection site. No evidence of new or progressive intracranial metastatic disease.    COGNITION: Overall cognitive status: Within functional limits for tasks assessed   SENSATION: WFL  COORDINATION: Some decreased motor control on RLE    MUSCLE LENGTH:  Some tightness in R hip, BL hamstrings, and  in R ankle   POSTURE: rounded shoulders  LOWER EXTREMITY ROM:   all WFL, except R ankle DF still limited to 10d    LOWER EXTREMITY MMT:  grossly 5/5    FUNCTIONAL TESTS:   9 reps  30 seconds chair stand test Berg Balance Scale: 50/56  PATIENT SURVEYS:  ABC scale 65%                                                                                                                              TREATMENT DATE:  05/20/24 Walk outdoors 2 laps  Leg ext HS curls  Taps to sea urchins  Resisted gait with cone tap x10 forwards On airex shoulder ext 10# 2x10   05/14/24 Elliptical L2 x49mins  Side steps on treadmill 0.5 mph Step overs 4 way  Rockerboard  Step ups on BOSU Catch on BOSU  04/23/24 Walk outdoors 2 laps  SLS 8s LLE, 4s on RLE  Tapping to different targets  Resisted side steps over obstacles 30# 4 way x4 Seated row and lat pulls 35# 2x10  04/15/24 Gait 3 laps around the back building up and down slope Seated Rows & Lats 45lb 2x12 Leg press 60lb 2x10 Shoulder abd 3lb 2x10. Standing rows 15lb 2x10  04/08/24 Elliptical L3 x 5 min Seated rows and lats 45lb 2x10  Shoulder Ext 15lb 2x10 Rows 15lb 2x10 Shoulder abd 3lb 2x10 SLS x 3 10'' each from floor   Unable to complete with RLE  Leg press 60lb 2x10 6in step ups x 5 each  03/31/24 Elliptical L3 x 5 min HS Curls 35lb 2x10 Leg Ext 10lb 2x10 6in forward and lateral step up x10 each Seated rows  and lats 35lb 2x10  Shoulder Ext 10lb 2x10 Shoulder Flex and abd 2lb 2x10 Triceps Ext 25lb 2x15  03/02/24 Gait outside 3 laps nice pace no rest 35# rows 35# lats Black tband back extension 15# straight arm pulls 60# leg press On airex head turns On airex reaching  02/26/24 2 laps around back building 4 way ankle 20 reps each Leg press 40# 2x10 Calf raises on press 2x15 Lat pull down 25# 2x10 Seated row 25# 2x10  02/12/24 Elliptical L4 x3mins  Cone taps on airex different colors- unable to keep foot up in SL Resisted gait side steps over obstacles Leg ext 15# 2x10 HS curls 35# 2x10  Walking on beam Balance on BOSU, then reaching for numbers  Shoulder ext 15# 2x10 Cable rows 15# 2x10 4# flexion to abd 2x10   02/03/24 Elliptical level 4 x 6 minutes SLS LLE x10 sec, RLE 3 sec 30 second chair stand test 12,  still below her norm for age Seated Rows & Lats 35lb 2x15 Chest Press 20lb 2x12  Triceps Ext 25lb 2x15 6in step ups x10 each  S2S OHP yellow ball 2x10  Shoulder abd & Flex 3lb 2x10  01/29/24 Elliptical level 4 x 6 minutes Seated Rows & Lats 35lb  2x12 Shoulder Ext 10lb 2x10  Rows 15lb 2x10 Triceps Ext 35lb 2x15 Alt 6in box taps x 10  From airex x 10 S2S OHP yellow ball 2x10  Chest Press 15lb 2x12  Shoulder flex & abd 4lb 2x10 Biceps curls 8lb 2x10   01/21/24 30 secondchair stand test 12, improved from 9 but still below her norm for age Elliptical level 4 x 4 minutes Walking ball toss Ball kicks On bosu reaching and reacting Rows 35# Lats 35# Chest press 15# 35# triceps 15# biceps 9# single arm overhead carry 20 # overhead carry both arms  01/14/24 Gait outside around the back building 20# seated row Lats 20# 10# straight arm pull 10# chest press  10# biceps  25# triceps 20# farmer carry 10# overhead single arm carry, then 10# with both arms overhead 5# UE circuit 5# sit to stand with overhead press   PATIENT EDUCATION: Education details:  POC Person educated: Patient Education method: Explanation Education comprehension: verbalized understanding  HOME EXERCISE PROGRAM: Walking, SLS, doing some exercises with bands  Access Code: [email or web address] URL: [email or web address] Date: 10/22/2023 Prepared by: Almetta Fam  Exercises - Single Leg Stance  - 1 x daily - 7 x weekly - 2 sets - 10 hold - Tandem Stance  - 1 x daily - 7 x weekly - 2 sets - 10 reps - Sit to Stand  - 1 x daily - 7 x weekly - 2 sets - 10 reps - Heel Raises with Counter Support  - 1 x daily - 7 x weekly - 2 sets - 10 reps - Heel Toe Raises with Counter Support  - 1 x daily - 7 x weekly - 2 sets - 10 reps  GOALS: Goals reviewed with patient? Yes  SHORT TERM GOALS: Target date: 11/27/23  Patient will be independent with initial HEP. Baseline:  Goal status: met with walking program 11/05/23  2.  Patient will improve R ankle DF to 20d Baseline: 10d Goal status:  met 12/03/23    LONG TERM GOALS: Target date: 06/25/24  Patient will be independent with advanced/ongoing HEP to improve outcomes and carryover.  Baseline:  Goal status: progressing 01/21/24  2.  Patient will be able stand SLS on RLE >10s on firm and foam surfaces Baseline: 5s at best on firm  Goal status: progressing 01/21/24, ongoing 02/03/24, RLE 4s LLE 8s 04/23/24, RLE 5s LLE 10s on firm 05/14/24  3.  Patient will be able to do at least 10 alternating taps on 6 in 20s  Baseline: 6 on 4 in 20s  Goal status: MET 11/19/23   4.  Patient will demonstrate 12 reps or more with 30s chair test Baseline: 9 Goal status: at 12 reps on 01/21/24, Met 02/03/24  5.  Patient will improve ABC confidence score to 75-80%  Baseline: 65%  Goal status: met 01/14/24  6. Patient will be able to do a light jog, small hops, and high level balance without LOB and fatigue  Goal status: ongoing 03/02/24, ongoing 04/08/24, 04/23/24, ongoing 05/14/24   ASSESSMENT:  CLINICAL IMPRESSION: Patient returns from  a cruise trip. She reports being tired from this and had some increased foot drop on the R side with more activity. Today the R foot does have increased difficulty with clearance.    EVAL Patient is a 48 y.o. female who was seen today for physical therapy evaluation and treatment for aftercare following brain surgery. Her surgery was 2 years ago but she began having complication recently and seizures.  She still has decreased coordination with RLE, and unable to do equal movements with both sides. R ankle DF has returned significantly but still missing about 10d. Pt would like to increase her mobility and ability to do things with her RLE to feel more balance and coordinated. She will benefit from PT to work on her overall strength, balance, and coordination to improve her confidence out in the community.   OBJECTIVE IMPAIRMENTS: Abnormal gait, decreased balance, decreased coordination, and decreased ROM.   ACTIVITY LIMITATIONS: locomotion level  PARTICIPATION LIMITATIONS: community activity and yard work  PERSONAL FACTORS: Fitness, Past/current experiences, and Time since onset of injury/illness/exacerbation are also affecting patient's functional outcome.   REHAB POTENTIAL: Good  CLINICAL DECISION MAKING: Stable/uncomplicated  EVALUATION COMPLEXITY: Low  PLAN:  PT FREQUENCY: 1x/week  PT DURATION: 12 weeks  PLANNED INTERVENTIONS: 97110-Therapeutic exercises, 97530- Therapeutic activity, [id number]- Neuromuscular re-education, 97535- Self Care, [id number]- Manual therapy, [phone]- Gait training, [phone]- Electrical stimulation (manual), [phone]- Ultrasound, Patient/Family education, Balance training, Stair training, Taping, Dry Needling, Joint mobilization, Spinal mobilization, Cryotherapy, and Moist heat  PLAN FOR NEXT SESSION: push strength, endurance, balance and functional gait, higher level activities for an overall higher quality of life   [place], PT 05/20/2024, 1:19 PM

## 2024-05-22 ENCOUNTER — Ambulatory Visit (HOSPITAL_COMMUNITY)
Admission: RE | Admit: 2024-05-22 | Discharge: 2024-05-22 | Disposition: A | Discharge status: Home / Self Care | Source: Ambulatory Visit | Attending: Hematology and Oncology | Admitting: Hematology and Oncology

## 2024-05-22 ENCOUNTER — Encounter (HOSPITAL_COMMUNITY): Payer: Self-pay

## 2024-05-22 DIAGNOSIS — C50412 Malignant neoplasm of upper-outer quadrant of left female breast: Secondary | ICD-10-CM | POA: Diagnosis present

## 2024-05-22 DIAGNOSIS — Z170 Estrogen receptor positive status [ER+]: Secondary | ICD-10-CM | POA: Insufficient documentation

## 2024-05-22 MED ORDER — IOHEXOL 300 MG/ML  SOLN
100.0000 mL | Freq: Once | INTRAMUSCULAR | Status: AC | PRN
  Administered 2024-05-22: 100 mL via INTRAVENOUS

## 2024-05-25 ENCOUNTER — Inpatient Hospital Stay: Attending: Hematology and Oncology | Admitting: Hematology and Oncology

## 2024-05-25 ENCOUNTER — Inpatient Hospital Stay: Admitting: Hematology and Oncology

## 2024-05-25 VITALS — BP 130/89 | HR 88 | Temp 98.5°F | Resp 17 | Ht 71.0 in | Wt 304.1 lb

## 2024-05-25 DIAGNOSIS — R590 Localized enlarged lymph nodes: Secondary | ICD-10-CM | POA: Diagnosis not present

## 2024-05-25 DIAGNOSIS — Z9221 Personal history of antineoplastic chemotherapy: Secondary | ICD-10-CM | POA: Diagnosis not present

## 2024-05-25 DIAGNOSIS — C50412 Malignant neoplasm of upper-outer quadrant of left female breast: Secondary | ICD-10-CM | POA: Insufficient documentation

## 2024-05-25 DIAGNOSIS — K760 Fatty (change of) liver, not elsewhere classified: Secondary | ICD-10-CM | POA: Insufficient documentation

## 2024-05-25 DIAGNOSIS — Z923 Personal history of irradiation: Secondary | ICD-10-CM | POA: Insufficient documentation

## 2024-05-25 DIAGNOSIS — Z170 Estrogen receptor positive status [ER+]: Secondary | ICD-10-CM | POA: Insufficient documentation

## 2024-05-25 DIAGNOSIS — C7931 Secondary malignant neoplasm of brain: Secondary | ICD-10-CM | POA: Diagnosis not present

## 2024-05-25 NOTE — Progress Notes (Signed)
 Patient Care Team: Patient, No Pcp Per as PCP - General (General Practice) [name], MD as Consulting Physician (General Surgery) Shannon Agent, MD as Consulting Physician (Radiation Oncology) [name], MD as Consulting Physician (Plastic Surgery) Odean Potts, MD as Consulting Physician (Hematology and Oncology) Lanis Pupa, MD as Consulting Physician (Neurosurgery)  DIAGNOSIS:  Encounter Diagnosis  Name Primary?   Malignant neoplasm of upper-outer quadrant of left breast in female, estrogen receptor positive (HCC) Yes    SUMMARY OF ONCOLOGIC HISTORY: Oncology History  Malignant neoplasm of upper-outer quadrant of left breast in female, estrogen receptor positive (HCC)  05/26/2020 Initial Diagnosis   Malignant neoplasm of upper-outer quadrant of left breast in female, estrogen receptor positive (HCC)   06/01/2020 Cancer Staging   Staging form: Breast, AJCC 8th Edition - Clinical stage from 06/01/2020: Stage IIA (cT2, cN0, cM0, G3, ER+, PR-, HER2+) - Signed by Loretha Ash, MD on 11/21/2022 Stage prefix: Initial diagnosis Histologic grading system: 3 grade system   06/16/2020 - 09/30/2020 Chemotherapy      Patient is on Antibody Plan: BREAST TRASTUZUMAB  Q21D    06/16/2020 Genetic Testing   Positive genetic testing: 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  Variant of uncertain significance in MSH3 at c.2724A>G (Silent).  No other pathogenic or uncertain variants were reported in the [facility] Multi-Cancer Panel.  The report date is June 16, 2020.    The variant of uncertain significance (VUS) in MSH3 at c.2724A>G (Silent) has been reclassified to likely benign.  The change in variant classification was made as a result of re-review of evidence in light of new variant interpretation guidelines and/or new information. The amended report date is January 10, 2021.   The Multi-Cancer Panel offered by [facility] includes sequencing and/or  deletion duplication testing of the following 85 genes: AIP, ALK, APC, ATM, AXIN2,BAP1,  BARD1, BLM, BMPR1A, BRCA1, BRCA2, BRIP1, CASR, CDC73, CDH1, CDK4, CDKN1B, CDKN1C, CDKN2A (p14ARF), CDKN2A (p16INK4a), CEBPA, CHEK2, CTNNA1, DICER1, DIS3L2, EGFR (c.2369C>T, p.Thr790Met variant only), EPCAM (Deletion/duplication testing only), FH, FLCN, GATA2, GPC3, GREM1 (Promoter region deletion/duplication testing only), HOXB13 (c.251G>A, p.Gly84Glu), HRAS, KIT, MAX, MEN1, MET, MITF (c.952G>A, p.Glu318Lys variant only), MLH1, MSH2, MSH3, MSH6, MUTYH, NBN, NF1, NF2, NTHL1, PALB2, PDGFRA, PHOX2B, PMS2, POLD1, POLE, POT1, PRKAR1A, PTCH1, PTEN, RAD50, RAD51C, RAD51D, RB1, RECQL4, RET, RNF43, RUNX1, SDHAF2, SDHA (sequence changes only), SDHB, SDHC, SDHD, SMAD4, SMARCA4, SMARCB1, SMARCE1, STK11, SUFU, TERC, TERT, TMEM127, TP53, TSC1, TSC2, VHL, WRN and WT1.    10/20/2020 - 10/20/2020 Chemotherapy    Patient is on Treatment Plan: BREAST  DOCETAXEL  + CARBOPLATIN  + TRASTUZUMAB  + PERTUZUMAB   (TCHP) Q21D       10/20/2020 - 06/23/2021 Chemotherapy   Patient is on Treatment Plan : BREAST Trastuzumab  q21d     11/10/2021 - 11/10/2021 Radiation Therapy   Site Technique Total Dose (Gy) Dose per Fx (Gy) Completed Fx Beam Energies  Brain: Brain [id number] IMRT 18/18 18 1/1 6XFFF       CHIEF COMPLIANT: Follow-up to discuss the results of CT scans  HISTORY OF PRESENT ILLNESS:  History of Present Illness Sheryl Mcmillan is a 48 year old female with a history of breast cancer who presents for routine follow-up.  She has metastatic breast cancer to the brain and undergoes surveillance scans every six months, which have all been negative. She has no new symptoms, including no new lumps, masses, or significant pain, and feels less anxious about visits given the stable imaging. She also has fatty liver, aortic pneumosclerosis, chronic  back arthritis with an old L2 compression, and longstanding stable findings of a  mediastinal lymph node and benign lung nodule, none of which have changed or caused new issues.     ALLERGIES:  is allergic to carboplatin , other, and wound dressing adhesive.  MEDICATIONS:  Current Outpatient Medications  Medication Sig Dispense Refill   levETIRAcetam  (KEPPRA ) 1000 MG tablet TAKE 1 TABLET(1000 MG) BY MOUTH TWICE DAILY 60 tablet 5   LORazepam  (ATIVAN ) 1 MG tablet Take one po 30 min prior to mri or radiation 7 tablet 0   Magnesium  400 MG CAPS Take 400 mg by mouth daily.     No current facility-administered medications for this visit.    PHYSICAL EXAMINATION: ECOG PERFORMANCE STATUS: 1 - Symptomatic but completely ambulatory  Vitals:   05/25/24 1532  BP: 130/89  Pulse: 88  Resp: 17  Temp: 98.5 F (36.9 C)  SpO2: 97%   Filed Weights   05/25/24 1532  Weight: (!) 304 lb 1.6 oz (137.9 kg)    Physical Exam   (exam performed in the presence of a chaperone)  LABORATORY DATA:  I have reviewed the data as listed    Latest Ref Rng & Units 11/11/2023    9:22 AM 04/24/2023    9:03 AM 04/11/2023    3:09 PM  CMP  Glucose 70 - 99 mg/dL 856  867  872   BUN 6 - 20 mg/dL 11  8  9    Creatinine 0.44 - 1.00 mg/dL 9.35  9.25  9.19   Sodium 135 - 145 mmol/L 142  140  138   Potassium 3.5 - 5.1 mmol/L 4.0  4.3  4.2   Chloride 98 - 111 mmol/L 111  109  100   CO2 22 - 32 mmol/L 23  21  24    Calcium  8.9 - 10.3 mg/dL 8.7  8.8  9.1   Total Protein 6.5 - 8.1 g/dL 6.6     Total Bilirubin 0.0 - 1.2 mg/dL 0.3     Alkaline Phos 38 - 126 U/L 93     AST 15 - 41 U/L 12     ALT 0 - 44 U/L 20       Lab Results  Component Value Date   WBC 9.2 11/11/2023   HGB 13.5 11/11/2023   HCT 40.5 11/11/2023   MCV 83.7 11/11/2023   PLT 287 11/11/2023   NEUTROABS 4.6 11/11/2023    ASSESSMENT & PLAN:  Malignant neoplasm of upper-outer quadrant of left breast in female, estrogen receptor positive (HCC) 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, PR negative, Ki-67 50%,  HER2 positive, genetics: BRCA1 mutation 06/16/2020: Neoadjuvant chemotherapy with TCH Perjeta  but Perjeta  was discontinued after cycle 1, Herceptin  maintenance completed December 2022 11/07/2020: Bilateral mastectomies: Residual grade 3 IDC left breast negative margins, triple negative with a Ki-67 of 15% 03/14/2021: Bilateral salpingo-oophorectomy   Hospitalization 11/17/2021-12/09/2021 partial seizures, brain MRI left frontal metastases resection of the tumor metastatic breast cancer HER2 positive: Right lower extremity weakness CT CAP 11/12/2021: No distant metastatic disease. CT CAP 05/18/22: mild non specific inc in para tracheal LN Nonspecific, stable 5 mm LUL nodule Bone scan: 05/18/22: L2 compression fracture, no bone mets CT chest done 08/22/2022: No evidence of metastatic disease, fatty liver Brain MRI 06/29/2022: New 2 mm metastasis right frontal lobe (SRS on 07/10/2022) 02/28/2023: CT CAP: No evidence of metastatic disease in CAP 11/11/2023: CT CAP: Stable prominent mediastinal lymph nodes, stable 4 mm lung nodule left, fatty  liver 05/22/2024: CT CAP: No evidence of metastatic disease, fatty liver   Brain MRI:   11/27/2023: Stable examination.  Previous resection of left frontal parietal vertex with residual gliosis Patient follows with Dr. Buckley   We plan to do scans once a year from this point onwards. Return to clinic in 1 year for follow-up after scans ------------------------------------- Assessment and Plan Assessment & Plan Estrogen receptor positive breast cancer with brain metastases Recent scans showed stable brain metastases and no new metastatic spread. Lymph node in chest and benign lung nodule remain unchanged. - Scheduled annual CT scan. - Performed blood work on the same day as the CT scan. - Scheduled follow-up appointment one week after the CT scan. - Advised to report any new lumps, persistent pain, or other concerning symptoms.  Fatty liver Mild fatty liver  consistent with previous findings, no acute changes.      No orders of the defined types were placed in this encounter.  The patient has a good understanding of the overall plan. she agrees with it. she will call with any problems that may develop before the next visit here.  I personally spent a total of 30 minutes in the care of the patient today including preparing to see the patient, getting/reviewing separately obtained history, performing a medically appropriate exam/evaluation, counseling and educating, placing orders, referring and communicating with other health care professionals, documenting clinical information in the EHR, independently interpreting results, communicating results, and coordinating care.   Viinay K Kanesha Cadle, MD 05/25/24

## 2024-05-25 NOTE — Assessment & Plan Note (Signed)
 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 CT CAP 05/18/22: mild non specific inc in para tracheal LN Nonspecific, stable 5 mm LUL nodule Bone scan: 05/18/22: L2 compression fracture, no bone mets CT chest done 08/22/2022: No evidence of metastatic disease, fatty liver Brain MRI 06/29/2022: New 2 mm metastasis right frontal lobe (SRS on 07/10/2022) 02/28/2023: CT CAP: No evidence of metastatic disease in CAP 11/11/2023: CT CAP: Stable prominent mediastinal lymph nodes, stable 4 mm lung nodule left, fatty liver 05/22/2024: CT CAP:   Brain MRI:   11/27/2023: Stable examination.  Previous resection of left frontal parietal vertex with residual gliosis Patient follows with Dr. Buckley   We will plan to do scans every 6 months.

## 2024-05-25 NOTE — Assessment & Plan Note (Deleted)
 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 CT CAP 05/18/22: mild non specific inc in para tracheal LN Nonspecific, stable 5 mm LUL nodule Bone scan: 05/18/22: L2 compression fracture, no bone mets CT chest done 08/22/2022: No evidence of metastatic disease, fatty liver Brain MRI 06/29/2022: New 2 mm metastasis right frontal lobe (SRS on 07/10/2022) 02/28/2023: CT CAP: No evidence of metastatic disease in CAP 11/11/2023: CT CAP: Stable prominent mediastinal lymph nodes, stable 4 mm lung nodule left, fatty liver 05/22/2024: CT CAP:   Brain MRI:   11/27/2023: Stable examination.  Previous resection of left frontal parietal vertex with residual gliosis Patient follows with Dr. Buckley   We will plan to do scans every 6 months.

## 2024-05-28 ENCOUNTER — Ambulatory Visit

## 2024-05-28 DIAGNOSIS — R278 Other lack of coordination: Secondary | ICD-10-CM | POA: Insufficient documentation

## 2024-05-28 DIAGNOSIS — R2681 Unsteadiness on feet: Secondary | ICD-10-CM | POA: Diagnosis present

## 2024-05-28 DIAGNOSIS — R262 Difficulty in walking, not elsewhere classified: Secondary | ICD-10-CM | POA: Diagnosis present

## 2024-05-28 DIAGNOSIS — R293 Abnormal posture: Secondary | ICD-10-CM | POA: Diagnosis present

## 2024-05-28 DIAGNOSIS — M6281 Muscle weakness (generalized): Secondary | ICD-10-CM | POA: Diagnosis present

## 2024-05-28 NOTE — Therapy (Signed)
 OUTPATIENT PHYSICAL THERAPY NEURO TREATMENT    Patient Name: Sheryl Mcmillan MRN: [id number] DOB:[birth year], 48 y.o., female Today's Date: 05/28/2024   PCP: Greig Drones REFERRING PROVIDER: [name]  END OF SESSION:  PT End of Session - 05/28/24 1544     Visit Number 24    Date for Recertification  06/25/24    PT Start Time 1545    PT Stop Time 1630    PT Time Calculation (min) 45 min    Activity Tolerance Patient tolerated treatment well    Behavior During Therapy [facility] for tasks assessed/performed                     Past Medical History:  Diagnosis Date   Arthritis    Breast cancer (HCC)    Family history of non-Hodgkin's lymphoma 06/01/2020   GERD (gastroesophageal reflux disease)    Hemorrhoids    Migraines    PCOS (polycystic ovarian syndrome)    PONV (postoperative nausea and vomiting)    Seizures (HCC)    Squamous cell carcinoma of skin    Past Surgical History:  Procedure Laterality Date   APPLICATION OF CRANIAL NAVIGATION Left 11/10/2021   Procedure: APPLICATION OF CRANIAL NAVIGATION;  Surgeon: [name], MD;  Location: [facility] OR;  Service: Neurosurgery;  Laterality: Left;   AXILLARY SENTINEL NODE BIOPSY Left 11/07/2020   Procedure: LEFT AXILLARY SENTINEL NODE BIOPSY;  Surgeon: [name], MD;  Location: [facility] OR;  Service: General;  Laterality: Left;   BREAST CYST EXCISION Right 09/22/2021   Procedure: Excision of right axillary excess soft tissue.;  Surgeon: [name], MD;  Location: [facility];  Service: Plastics;  Laterality: Right;   BREAST RECONSTRUCTION WITH PLACEMENT OF TISSUE EXPANDER AND FLEX HD (ACELLULAR HYDRATED DERMIS) Bilateral 11/07/2020   Procedure: BILATERAL BREAST RECONSTRUCTION WITH PLACEMENT OF TISSUE EXPANDER AND FLEX HD (ACELLULAR HYDRATED DERMIS);  Surgeon: [name], MD;  Location: [facility] OR;  Service: Plastics;  Laterality: Bilateral;   COSMETIC SURGERY     CRANIOTOMY Left 11/10/2021    Procedure: LT FRONTAL CRANIOTOMY TUMOR EXCISION;  Surgeon: [name], MD;  Location: [facility] OR;  Service: Neurosurgery;  Laterality: Left;   DILATION AND CURETTAGE OF UTERUS     HEMORRHOID SURGERY     IR IMAGING GUIDED PORT INSERTION  06/15/2020   LIPOSUCTION WITH LIPOFILLING Bilateral 09/22/2021   Procedure: Fat grafting bilateral breast;  Surgeon: [name], MD;  Location: [facility];  Service: Plastics;  Laterality: Bilateral;   MASS EXCISION Bilateral 04/25/2021   Procedure: excision of bilateral axillary breast tissue;  Surgeon: [name], MD;  Location: [facility];  Service: Plastics;  Laterality: Bilateral;   PORT-A-CATH REMOVAL Right 11/07/2020   Procedure: REMOVAL PORT-A-CATH;  Surgeon: [name], MD;  Location: [facility] OR;  Service: General;  Laterality: Right;   REMOVAL OF BILATERAL TISSUE EXPANDERS WITH PLACEMENT OF BILATERAL BREAST IMPLANTS Bilateral 04/25/2021   Procedure: REMOVAL OF BILATERAL TISSUE EXPANDERS WITH PLACEMENT OF BILATERAL BREAST IMPLANTS;  Surgeon: [name], MD;  Location: [facility];  Service: Plastics;  Laterality: Bilateral;   ROBOTIC ASSISTED TOTAL HYSTERECTOMY WITH BILATERAL SALPINGO OOPHERECTOMY Bilateral 03/14/2021   Procedure: XI ROBOTIC ASSISTED TOTAL HYSTERECTOMY WITH BILATERAL SALPINGO OOPHORECTOMY;  Surgeon: [name], MD;  Location: [facility];  Service: Gynecology;  Laterality: Bilateral;   TOTAL MASTECTOMY Bilateral 11/07/2020   Procedure: BILATERAL MASTECTOMY;  Surgeon: [name], MD;  Location: [facility] OR;  Service:  General;  Laterality: Bilateral;   Patient Active Problem List   Diagnosis Date Noted   Leukocytosis 04/12/2023   Hyperlipidemia 04/11/2023   Seizure (HCC) 04/11/2023   Brain tumor (HCC) 11/08/2021   Metastatic cancer to brain (HCC) 11/08/2021   Cerebral edema (HCC) 11/08/2021   Complex partial seizure (HCC) 11/08/2021   Brain mass 11/07/2021   Right sided  weakness 11/07/2021   IDA (iron deficiency anemia) 07/18/2021   Cholelithiasis 03/14/2021   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   Obesity, Class III, BMI 40-49.9 (morbid obesity) (HCC) 07/19/2020   BRCA1 gene mutation positive 07/04/2020   Genetic testing 06/28/2020   Family history of non-Hodgkin's lymphoma 06/01/2020   Malignant neoplasm of upper-outer quadrant of left breast in female, estrogen receptor positive (HCC) 05/26/2020   Acute right ankle pain 05/16/2016   Nondisplaced fracture of lateral malleolus of right fibula, initial encounter for closed fracture 05/16/2016    ONSET DATE: 09/24/23  REFERRING DIAG:  C79.31 (ICD-10-CM) - Secondary malignant neoplasm of brain    THERAPY DIAG:  Unsteadiness on feet  Muscle weakness (generalized)  Other lack of coordination  Rationale for Evaluation and Treatment: Rehabilitation  SUBJECTIVE:                                                                                                                                                                                             SUBJECTIVE STATEMENT: Doing good, had a busy couple of weeks.   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 Last one was in October. I couldn't drive for 6 months. Have to go back next month for scans to check for brain activity and cancer. I feel good and better. The medicine is regulated now.  Pt accompanied by: self  PERTINENT HISTORY: hx of breast cancer and total masectomy, seizures, frontal craniotomy for tumor excision 11/10/21  PAIN:  Are you having pain? No  PRECAUTIONS: Fall  RED FLAGS: None   WEIGHT BEARING RESTRICTIONS: No  FALLS: Has patient fallen in last 6 months? No  LIVING ENVIRONMENT: Lives with: lives with their family Lives in: House/apartment Stairs: Yes: External: 3 steps; none Has following equipment at home: Single point cane  PLOF: Independent  PATIENT GOALS: want to be more aligned,  I can feel my gait is off. Want to be more balanced and coordinated   OBJECTIVE:  Note: Objective measures were completed at Evaluation unless otherwise noted.  DIAGNOSTIC FINDINGS: 07/08/23 Brain: Encephalomalacia and a small amount of chronic blood products are again noted at the left frontoparietal resection site. A small amount of nonsuspicious enhancement at the resection  site is unchanged. No new enhancing intracranial lesion is identified. The ventricles are normal in size. No acute infarct, midline shift, or extra-axial fluid collection is evident.  Unchanged left frontoparietal resection site. No evidence of new or progressive intracranial metastatic disease.    COGNITION: Overall cognitive status: Within functional limits for tasks assessed   SENSATION: WFL  COORDINATION: Some decreased motor control on RLE    MUSCLE LENGTH:  Some tightness in R hip, BL hamstrings, and in R ankle   POSTURE: rounded shoulders  LOWER EXTREMITY ROM:   all WFL, except R ankle DF still limited to 10d    LOWER EXTREMITY MMT:  grossly 5/5    FUNCTIONAL TESTS:   9 reps  30 seconds chair stand test Berg Balance Scale: 50/56  PATIENT SURVEYS:  ABC scale 65%                                                                                                                              TREATMENT DATE:  05/28/24 NuStep L5x39mins  Bike with power bursts L4x4mins Leg ext 15# 2x12 HS curls 35# 2x12 Resisted gait with cone tap 40#  On airex shoulder ext 10# 2x10 Step ups from airex to 8   05/14/24 Elliptical L2 x76mins  Side steps on treadmill 0.5 mph Step overs 4 way  Rockerboard  Step ups on BOSU Catch on BOSU  04/23/24 Walk outdoors 2 laps  SLS 8s LLE, 4s on RLE  Tapping to different targets  Resisted side steps over obstacles 30# 4 way x4 Seated row and lat pulls 35# 2x10  04/15/24 Gait 3 laps around the back building up and down slope Seated Rows & Lats 45lb 2x12 Leg press  60lb 2x10 Shoulder abd 3lb 2x10. Standing rows 15lb 2x10  04/08/24 Elliptical L3 x 5 min Seated rows and lats 45lb 2x10  Shoulder Ext 15lb 2x10 Rows 15lb 2x10 Shoulder abd 3lb 2x10 SLS x 3 10'' each from floor   Unable to complete with RLE  Leg press 60lb 2x10 6in step ups x 5 each  03/31/24 Elliptical L3 x 5 min HS Curls 35lb 2x10 Leg Ext 10lb 2x10 6in forward and lateral step up x10 each Seated rows and lats 35lb 2x10  Shoulder Ext 10lb 2x10 Shoulder Flex and abd 2lb 2x10 Triceps Ext 25lb 2x15  03/02/24 Gait outside 3 laps nice pace no rest 35# rows 35# lats Black tband back extension 15# straight arm pulls 60# leg press On airex head turns On airex reaching  02/26/24 2 laps around back building 4 way ankle 20 reps each Leg press 40# 2x10 Calf raises on press 2x15 Lat pull down 25# 2x10 Seated row 25# 2x10  02/12/24 Elliptical L4 x58mins  Cone taps on airex different colors- unable to keep foot up in SL Resisted gait side steps over obstacles Leg ext 15# 2x10 HS curls 35# 2x10  Walking on beam Balance on BOSU, then reaching for numbers  Shoulder ext 15# 2x10 Cable rows 15# 2x10 4# flexion to abd 2x10   02/03/24 Elliptical level 4 x 6 minutes SLS LLE x10 sec, RLE 3 sec 30 second chair stand test 12,  still below her norm for age Seated Rows & Lats 35lb 2x15 Chest Press 20lb 2x12  Triceps Ext 25lb 2x15 6in step ups x10 each  S2S OHP yellow ball 2x10  Shoulder abd & Flex 3lb 2x10  01/29/24 Elliptical level 4 x 6 minutes Seated Rows & Lats 35lb 2x12 Shoulder Ext 10lb 2x10  Rows 15lb 2x10 Triceps Ext 35lb 2x15 Alt 6in box taps x 10  From airex x 10 S2S OHP yellow ball 2x10  Chest Press 15lb 2x12  Shoulder flex & abd 4lb 2x10 Biceps curls 8lb 2x10   01/21/24 30 secondchair stand test 12, improved from 9 but still below her norm for age Elliptical level 4 x 4 minutes Walking ball toss Ball kicks On bosu reaching and reacting Rows 35# Lats  35# Chest press 15# 35# triceps 15# biceps 9# single arm overhead carry 20 # overhead carry both arms  01/14/24 Gait outside around the back building 20# seated row Lats 20# 10# straight arm pull 10# chest press  10# biceps  25# triceps 20# farmer carry 10# overhead single arm carry, then 10# with both arms overhead 5# UE circuit 5# sit to stand with overhead press   PATIENT EDUCATION: Education details: POC Person educated: Patient Education method: Explanation Education comprehension: verbalized understanding  HOME EXERCISE PROGRAM: Walking, SLS, doing some exercises with bands  Access Code: [email or web address] URL: [email or web address] Date: 10/22/2023 Prepared by: Almetta Fam  Exercises - Single Leg Stance  - 1 x daily - 7 x weekly - 2 sets - 10 hold - Tandem Stance  - 1 x daily - 7 x weekly - 2 sets - 10 reps - Sit to Stand  - 1 x daily - 7 x weekly - 2 sets - 10 reps - Heel Raises with Counter Support  - 1 x daily - 7 x weekly - 2 sets - 10 reps - Heel Toe Raises with Counter Support  - 1 x daily - 7 x weekly - 2 sets - 10 reps  GOALS: Goals reviewed with patient? Yes  SHORT TERM GOALS: Target date: 11/27/23  Patient will be independent with initial HEP. Baseline:  Goal status: met with walking program 11/05/23  2.  Patient will improve R ankle DF to 20d Baseline: 10d Goal status:  met 12/03/23    LONG TERM GOALS: Target date: 06/25/24  Patient will be independent with advanced/ongoing HEP to improve outcomes and carryover.  Baseline:  Goal status: progressing 01/21/24  2.  Patient will be able stand SLS on RLE >10s on firm and foam surfaces Baseline: 5s at best on firm  Goal status: progressing 01/21/24, ongoing 02/03/24, RLE 4s LLE 8s 04/23/24, RLE 5s LLE 10s on firm 05/14/24  3.  Patient will be able to do at least 10 alternating taps on 6 in 20s  Baseline: 6 on 4 in 20s  Goal status: MET 11/19/23   4.  Patient will demonstrate 12 reps or  more with 30s chair test Baseline: 9 Goal status: at 12 reps on 01/21/24, Met 02/03/24  5.  Patient will improve ABC confidence score to 75-80%  Baseline: 65%  Goal status: met 01/14/24  6. Patient will be able to do a light jog, small hops, and high level balance without LOB and fatigue  Goal  status: ongoing 03/02/24, ongoing 04/08/24, 04/23/24, ongoing 05/14/24   ASSESSMENT:  CLINICAL IMPRESSION: Patient continues to do well. Really pushed her today with more cardio and dynamic balance tasks. She has some hesitancy with step ups but once she begins, she is able to do at a steady pace. She also had some instability with cone taps but is able to independently regain balance.   EVAL Patient is a 48 y.o. female who was seen today for physical therapy evaluation and treatment for aftercare following brain surgery. Her surgery was 2 years ago but she began having complication recently and seizures. She still has decreased coordination with RLE, and unable to do equal movements with both sides. R ankle DF has returned significantly but still missing about 10d. Pt would like to increase her mobility and ability to do things with her RLE to feel more balance and coordinated. She will benefit from PT to work on her overall strength, balance, and coordination to improve her confidence out in the community.   OBJECTIVE IMPAIRMENTS: Abnormal gait, decreased balance, decreased coordination, and decreased ROM.   ACTIVITY LIMITATIONS: locomotion level  PARTICIPATION LIMITATIONS: community activity and yard work  PERSONAL FACTORS: Fitness, Past/current experiences, and Time since onset of injury/illness/exacerbation are also affecting patient's functional outcome.   REHAB POTENTIAL: Good  CLINICAL DECISION MAKING: Stable/uncomplicated  EVALUATION COMPLEXITY: Low  PLAN:  PT FREQUENCY: 1x/week  PT DURATION: 12 weeks  PLANNED INTERVENTIONS: 97110-Therapeutic exercises, 97530- Therapeutic activity,  [id number]- Neuromuscular re-education, 97535- Self Care, [id number]- Manual therapy, [phone]- Gait training, [phone]- Electrical stimulation (manual), [phone]- Ultrasound, Patient/Family education, Balance training, Stair training, Taping, Dry Needling, Joint mobilization, Spinal mobilization, Cryotherapy, and Moist heat  PLAN FOR NEXT SESSION: push strength, endurance, balance and functional gait, higher level activities for an overall higher quality of life   [place], PT 05/28/2024, 4:28 PM

## 2024-06-09 ENCOUNTER — Telehealth: Payer: Self-pay | Admitting: *Deleted

## 2024-06-09 NOTE — Telephone Encounter (Signed)
 PC received from patient, message sent to Dr Buckley:  Sheryl Mcmillan called today & said she continues to have issues with her R leg, states she discussed this with you at her last visit.  She says she feels like she can't stand on her leg but this sensation goes away in a few minutes.  She was vague in describing this, she says it starts in her groin, it hurts a little bit.  She didn't answer when I asked if she is experiencing numbness.  She did say it is happening more frequently.  Do you have any recommendations?   Dr Buckley requests phone visit with patient on Thursday.  PC to patient, informed her phone visit has been scheduled on 06/12/23 at 10:00.  She verbalizes understanding.

## 2024-06-11 ENCOUNTER — Ambulatory Visit: Admitting: Dermatology

## 2024-06-11 ENCOUNTER — Inpatient Hospital Stay: Admitting: Internal Medicine

## 2024-06-11 ENCOUNTER — Encounter: Payer: Self-pay | Admitting: Dermatology

## 2024-06-11 VITALS — BP 125/83 | HR 88

## 2024-06-11 DIAGNOSIS — Z85828 Personal history of other malignant neoplasm of skin: Secondary | ICD-10-CM

## 2024-06-11 DIAGNOSIS — D1801 Hemangioma of skin and subcutaneous tissue: Secondary | ICD-10-CM | POA: Diagnosis not present

## 2024-06-11 DIAGNOSIS — D229 Melanocytic nevi, unspecified: Secondary | ICD-10-CM

## 2024-06-11 DIAGNOSIS — L821 Other seborrheic keratosis: Secondary | ICD-10-CM | POA: Diagnosis not present

## 2024-06-11 DIAGNOSIS — L578 Other skin changes due to chronic exposure to nonionizing radiation: Secondary | ICD-10-CM

## 2024-06-11 DIAGNOSIS — R531 Weakness: Secondary | ICD-10-CM

## 2024-06-11 DIAGNOSIS — W908XXA Exposure to other nonionizing radiation, initial encounter: Secondary | ICD-10-CM | POA: Diagnosis not present

## 2024-06-11 DIAGNOSIS — Z1283 Encounter for screening for malignant neoplasm of skin: Secondary | ICD-10-CM | POA: Diagnosis not present

## 2024-06-11 DIAGNOSIS — L814 Other melanin hyperpigmentation: Secondary | ICD-10-CM | POA: Diagnosis not present

## 2024-06-11 DIAGNOSIS — Z86007 Personal history of in-situ neoplasm of skin: Secondary | ICD-10-CM

## 2024-06-11 DIAGNOSIS — C7931 Secondary malignant neoplasm of brain: Secondary | ICD-10-CM | POA: Diagnosis not present

## 2024-06-11 MED ORDER — DEXAMETHASONE 2 MG PO TABS: 2.0000 mg | ORAL_TABLET | Freq: Every day | ORAL | 0 refills | Status: DC

## 2024-06-11 NOTE — Patient Instructions (Signed)

## 2024-06-11 NOTE — Progress Notes (Unsigned)
° °  Total Body Skin Exam (TBSE) Visit   Subjective  Sheryl Mcmillan is a 48 y.o. female who presents for the following: Skin Cancer Screening and Full Body Skin Exam  Patient presents today for follow up visit for TBSE. Patient was last evaluated on 02/10/24 . Patient denies medication changes. Patient reports she does have spots, moles and lesions of concern to be evaluated on her chest. Patient reports throughout her lifetime she has had severe sun exposure. Currently, patient reports if she has excessive sun exposure, she does apply sunscreen and/or wears protective coverings. Patient reports she has hx of bx. Patient denies  family history of skin cancers. The patient has spots, moles and lesions to be evaluated, some may be new or changing and the patient has concerns that these could be cancer.  The following portions of the chart were reviewed this encounter and updated as appropriate: medications, allergies, medical history  Review of Systems:  No other skin or systemic complaints except as noted in HPI or Assessment and Plan.  Objective  Well appearing patient in no apparent distress; mood and affect are within normal limits.  A full examination was performed including scalp, head, eyes, ears, nose, lips, neck, chest, axillae, abdomen, back, buttocks, bilateral upper extremities, bilateral lower extremities, hands, feet, fingers, toes, fingernails, and toenails. All findings within normal limits unless otherwise noted below.   Relevant physical exam findings are noted in the Assessment and Plan.    Assessment & Plan   LENTIGINES, SEBORRHEIC KERATOSES, HEMANGIOMAS - Benign normal skin lesions - Benign-appearing - Call for any changes  MELANOCYTIC NEVI - Tan-brown and/or pink-flesh-colored symmetric macules and papules - Benign appearing on exam today - Observation - Call clinic for new or changing moles - Recommend daily use of broad spectrum spf 30+ sunscreen to sun-exposed  areas.   ACTINIC DAMAGE - Chronic condition, secondary to cumulative UV/sun exposure - diffuse scaly erythematous macules with underlying dyspigmentation - Recommend daily broad spectrum sunscreen SPF 30+ to sun-exposed areas, reapply every 2 hours as needed.  - Staying in the shade or wearing long sleeves, sun glasses (UVA+UVB protection) and wide brim hats (4-inch brim around the entire circumference of the hat) are also recommended for sun protection.  - Call for new or changing lesions.  SKIN CANCER SCREENING PERFORMED TODAY.  HISTORY OF SQUAMOUS CELL CARCINOMA OF THE SKIN - No evidence of recurrence today - No lymphadenopathy - Recommend regular full body skin exams - Recommend daily broad spectrum sunscreen SPF 30+ to sun-exposed areas, reapply every 2 hours as needed.  - Call if any new or changing lesions are noted between office visits       Return in about 6 months (around 12/10/2024) for TBSE.  I, Sheryl Mcmillan, Sheryl Mcmillan, am acting as scribe for RUFUS CHRISTELLA HOLY, MD.   Documentation: I have reviewed the above documentation for accuracy and completeness, and I agree with the above.  RUFUS CHRISTELLA HOLY, MD

## 2024-06-11 NOTE — Progress Notes (Signed)
 I connected with Sheryl Mcmillan on 06/11/2024 at 10:00 AM EST by telephone visit and verified that I am speaking with the correct person using two identifiers.  I discussed the limitations, risks, security and privacy concerns of performing an evaluation and management service by telemedicine and the availability of in-person appointments. I also discussed with the patient that there may be a patient responsible charge related to this service. The patient expressed understanding and agreed to proceed.   Other persons participating in the visit and their role in the encounter:  n/a   Patient's location:  Home Provider's location:  Office Chief Complaint:  Metastatic cancer to brain [facility])  Right sided weakness  History of Present Ilness: Sheryl Mcmillan reports some worsening weakness of her right leg in recent days and weeks.  She continues to walk independently, but episodes of feeling weaker with the right leg are increased from prior.  No issues with the right arm or hand.  Observations: Language and cognition at baseline  Assessment and Plan: Metastatic cancer to brain [facility])  Right sided weakness  Likely secondary to modest inflammation from treated left frontal metastasis.  Recommended trial of decadron  2mg  daily x7 days.    Follow Up Instructions: We will touch base with her via phone to assess response to decadron  and continue titration if necessary.  I discussed the assessment and treatment plan with the patient.  The patient was provided an opportunity to ask questions and all were answered.  The patient agreed with the plan and demonstrated understanding of the instructions.    The patient was advised to call back or seek an in-person evaluation if the symptoms worsen or if the condition fails to improve as anticipated.    Mirza Kidney K Tomia Enlow, MD   I provided 20 minutes of non face-to-face telephone visit time during this encounter, and > 50% was spent counseling as documented  under my assessment & plan.

## 2024-06-17 ENCOUNTER — Encounter: Payer: Self-pay | Admitting: Physical Therapy

## 2024-06-17 ENCOUNTER — Ambulatory Visit: Admitting: Physical Therapy

## 2024-06-17 DIAGNOSIS — R293 Abnormal posture: Secondary | ICD-10-CM

## 2024-06-17 DIAGNOSIS — M6281 Muscle weakness (generalized): Secondary | ICD-10-CM

## 2024-06-17 DIAGNOSIS — R278 Other lack of coordination: Secondary | ICD-10-CM

## 2024-06-17 DIAGNOSIS — R262 Difficulty in walking, not elsewhere classified: Secondary | ICD-10-CM

## 2024-06-17 DIAGNOSIS — R2681 Unsteadiness on feet: Secondary | ICD-10-CM | POA: Diagnosis not present

## 2024-06-17 NOTE — Therapy (Signed)
 " OUTPATIENT PHYSICAL THERAPY NEURO TREATMENT    Patient Name: Sheryl Mcmillan MRN: [id number] DOB:May 18, 1976, 48 y.o., female Today's Date: 06/17/2024   PCP: Greig Drones REFERRING PROVIDER: [name]  END OF SESSION:  PT End of Session - 06/17/24 1301     Visit Number 25    Authorization Type Medicaid    PT Start Time 1220    PT Stop Time 1310    PT Time Calculation (min) 50 min    Activity Tolerance Patient tolerated treatment well    Behavior During Therapy WFL for tasks assessed/performed                     Past Medical History:  Diagnosis Date   Arthritis    Breast cancer (HCC)    Family history of non-Hodgkin's lymphoma 06/01/2020   GERD (gastroesophageal reflux disease)    Hemorrhoids    Migraines    PCOS (polycystic ovarian syndrome)    PONV (postoperative nausea and vomiting)    Seizures (HCC)    Squamous cell carcinoma of skin    Past Surgical History:  Procedure Laterality Date   APPLICATION OF CRANIAL NAVIGATION Left 11/10/2021   Procedure: APPLICATION OF CRANIAL NAVIGATION;  Surgeon: [name], MD;  Location: [facility] OR;  Service: Neurosurgery;  Laterality: Left;   AXILLARY SENTINEL NODE BIOPSY Left 11/07/2020   Procedure: LEFT AXILLARY SENTINEL NODE BIOPSY;  Surgeon: [name], MD;  Location: [facility] OR;  Service: General;  Laterality: Left;   BREAST CYST EXCISION Right 09/22/2021   Procedure: Excision of right axillary excess soft tissue.;  Surgeon: [name], MD;  Location: [facility];  Service: Plastics;  Laterality: Right;   BREAST RECONSTRUCTION WITH PLACEMENT OF TISSUE EXPANDER AND FLEX HD (ACELLULAR HYDRATED DERMIS) Bilateral 11/07/2020   Procedure: BILATERAL BREAST RECONSTRUCTION WITH PLACEMENT OF TISSUE EXPANDER AND FLEX HD (ACELLULAR HYDRATED DERMIS);  Surgeon: [name], MD;  Location: [facility] OR;  Service: Plastics;  Laterality: Bilateral;   COSMETIC SURGERY     CRANIOTOMY Left 11/10/2021    Procedure: LT FRONTAL CRANIOTOMY TUMOR EXCISION;  Surgeon: [name], MD;  Location: [facility] OR;  Service: Neurosurgery;  Laterality: Left;   DILATION AND CURETTAGE OF UTERUS     HEMORRHOID SURGERY     IR IMAGING GUIDED PORT INSERTION  06/15/2020   LIPOSUCTION WITH LIPOFILLING Bilateral 09/22/2021   Procedure: Fat grafting bilateral breast;  Surgeon: [name], MD;  Location: [facility];  Service: Plastics;  Laterality: Bilateral;   MASS EXCISION Bilateral 04/25/2021   Procedure: excision of bilateral axillary breast tissue;  Surgeon: [name], MD;  Location: [facility];  Service: Plastics;  Laterality: Bilateral;   PORT-A-CATH REMOVAL Right 11/07/2020   Procedure: REMOVAL PORT-A-CATH;  Surgeon: [name], MD;  Location: [facility] OR;  Service: General;  Laterality: Right;   REMOVAL OF BILATERAL TISSUE EXPANDERS WITH PLACEMENT OF BILATERAL BREAST IMPLANTS Bilateral 04/25/2021   Procedure: REMOVAL OF BILATERAL TISSUE EXPANDERS WITH PLACEMENT OF BILATERAL BREAST IMPLANTS;  Surgeon: [name], MD;  Location: [facility];  Service: Plastics;  Laterality: Bilateral;   ROBOTIC ASSISTED TOTAL HYSTERECTOMY WITH BILATERAL SALPINGO OOPHERECTOMY Bilateral 03/14/2021   Procedure: XI ROBOTIC ASSISTED TOTAL HYSTERECTOMY WITH BILATERAL SALPINGO OOPHORECTOMY;  Surgeon: [name], MD;  Location: [facility];  Service: Gynecology;  Laterality: Bilateral;   TOTAL MASTECTOMY Bilateral 11/07/2020   Procedure: BILATERAL MASTECTOMY;  Surgeon: [name], MD;  Location: [facility] OR;  Service: General;  Laterality: Bilateral;   Patient Active Problem List   Diagnosis Date Noted   Leukocytosis 04/12/2023   Hyperlipidemia 04/11/2023   Seizure (HCC) 04/11/2023   Brain tumor (HCC) 11/08/2021   Metastatic cancer to brain (HCC) 11/08/2021   Cerebral edema (HCC) 11/08/2021   Complex partial seizure (HCC) 11/08/2021   Brain mass 11/07/2021   Right sided  weakness 11/07/2021   IDA (iron deficiency anemia) 07/18/2021   Cholelithiasis 03/14/2021   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   Obesity, Class III, BMI 40-49.9 (morbid obesity) (HCC) 07/19/2020   BRCA1 gene mutation positive 07/04/2020   Genetic testing 06/28/2020   Family history of non-Hodgkin's lymphoma 06/01/2020   Malignant neoplasm of upper-outer quadrant of left breast in female, estrogen receptor positive (HCC) 05/26/2020   Acute right ankle pain 05/16/2016   Nondisplaced fracture of lateral malleolus of right fibula, initial encounter for closed fracture 05/16/2016    ONSET DATE: 09/24/23  REFERRING DIAG:  C79.31 (ICD-10-CM) - Secondary malignant neoplasm of brain    THERAPY DIAG:  Unsteadiness on feet  Muscle weakness (generalized)  Other lack of coordination  Difficulty in walking, not elsewhere classified  Abnormal posture  Rationale for Evaluation and Treatment: Rehabilitation  SUBJECTIVE:                                                                                                                                                                                             SUBJECTIVE STATEMENT: Doing good, had a busy couple of weeks. 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 Last one was in October. I couldn't drive for 6 months. Have to go back next month for scans to check for brain activity and cancer. I feel good and better. The medicine is regulated now.  Pt accompanied by: self  PERTINENT HISTORY: hx of breast cancer and total masectomy, seizures, frontal craniotomy for tumor excision 11/10/21  PAIN:  Are you having pain? No  PRECAUTIONS: Fall  RED FLAGS: None   WEIGHT BEARING RESTRICTIONS: No  FALLS: Has patient fallen in last 6 months? No  LIVING ENVIRONMENT: Lives with: lives with their family Lives in: House/apartment Stairs: Yes: External: 3 steps; none Has following equipment at home:  Single point cane  PLOF: Independent  PATIENT GOALS: want to be more aligned, I can feel my gait is off. Want to be more balanced and coordinated   OBJECTIVE:  Note: Objective measures were completed at Evaluation unless otherwise noted.  DIAGNOSTIC FINDINGS: 07/08/23 Brain: Encephalomalacia and a small amount of chronic blood products are again noted at the left frontoparietal resection site.  A small amount of nonsuspicious enhancement at the resection site is unchanged. No new enhancing intracranial lesion is identified. The ventricles are normal in size. No acute infarct, midline shift, or extra-axial fluid collection is evident.  Unchanged left frontoparietal resection site. No evidence of new or progressive intracranial metastatic disease.    COGNITION: Overall cognitive status: Within functional limits for tasks assessed   SENSATION: WFL  COORDINATION: Some decreased motor control on RLE    MUSCLE LENGTH:  Some tightness in R hip, BL hamstrings, and in R ankle   POSTURE: rounded shoulders  LOWER EXTREMITY ROM:   all WFL, except R ankle DF still limited to 10d    LOWER EXTREMITY MMT:  grossly 5/5    FUNCTIONAL TESTS:   9 reps  30 seconds chair stand test Berg Balance Scale: 50/56  PATIENT SURVEYS:  ABC scale 65%                                                                                                                              TREATMENT DATE:  06/17/24 Gait outside multiple laps uneven terrain, curbs, stairs slopes, grass and pine needles Calf stretches HS and piriformis stretches Review of HEP Review of walking program and benefits post CA Review of fall risks and home safety  05/28/24 NuStep L5x60mins  Bike with power bursts L4x88mins Leg ext 15# 2x12 HS curls 35# 2x12 Resisted gait with cone tap 40#  On airex shoulder ext 10# 2x10 Step ups from airex to 8   05/14/24 Elliptical L2 x54mins  Side steps on treadmill 0.5 mph Step overs 4 way   Rockerboard  Step ups on BOSU Catch on BOSU  04/23/24 Walk outdoors 2 laps  SLS 8s LLE, 4s on RLE  Tapping to different targets  Resisted side steps over obstacles 30# 4 way x4 Seated row and lat pulls 35# 2x10  04/15/24 Gait 3 laps around the back building up and down slope Seated Rows & Lats 45lb 2x12 Leg press 60lb 2x10 Shoulder abd 3lb 2x10. Standing rows 15lb 2x10  04/08/24 Elliptical L3 x 5 min Seated rows and lats 45lb 2x10  Shoulder Ext 15lb 2x10 Rows 15lb 2x10 Shoulder abd 3lb 2x10 SLS x 3 10'' each from floor   Unable to complete with RLE  Leg press 60lb 2x10 6in step ups x 5 each  03/31/24 Elliptical L3 x 5 min HS Curls 35lb 2x10 Leg Ext 10lb 2x10 6in forward and lateral step up x10 each Seated rows and lats 35lb 2x10  Shoulder Ext 10lb 2x10 Shoulder Flex and abd 2lb 2x10 Triceps Ext 25lb 2x15  03/02/24 Gait outside 3 laps nice pace no rest 35# rows 35# lats Black tband back extension 15# straight arm pulls 60# leg press On airex head turns On airex reaching  02/26/24 2 laps around back building 4 way ankle 20 reps each Leg press 40# 2x10 Calf raises on press 2x15 Lat pull down 25# 2x10 Seated  row 25# 2x10  02/12/24 Elliptical L4 x22mins  Cone taps on airex different colors- unable to keep foot up in SL Resisted gait side steps over obstacles Leg ext 15# 2x10 HS curls 35# 2x10  Walking on beam Balance on BOSU, then reaching for numbers  Shoulder ext 15# 2x10 Cable rows 15# 2x10 4# flexion to abd 2x10   02/03/24 Elliptical level 4 x 6 minutes SLS LLE x10 sec, RLE 3 sec 30 second chair stand test 12,  still below her norm for age Seated Rows & Lats 35lb 2x15 Chest Press 20lb 2x12  Triceps Ext 25lb 2x15 6in step ups x10 each  S2S OHP yellow ball 2x10  Shoulder abd & Flex 3lb 2x10  01/29/24 Elliptical level 4 x 6 minutes Seated Rows & Lats 35lb 2x12 Shoulder Ext 10lb 2x10  Rows 15lb 2x10 Triceps Ext 35lb 2x15 Alt 6in box taps x  10  From airex x 10 S2S OHP yellow ball 2x10  Chest Press 15lb 2x12  Shoulder flex & abd 4lb 2x10 Biceps curls 8lb 2x10   01/21/24 30 secondchair stand test 12, improved from 9 but still below her norm for age Elliptical level 4 x 4 minutes Walking ball toss Ball kicks On bosu reaching and reacting Rows 35# Lats 35# Chest press 15# 35# triceps 15# biceps 9# single arm overhead carry 20 # overhead carry both arms  01/14/24 Gait outside around the back building 20# seated row Lats 20# 10# straight arm pull 10# chest press  10# biceps  25# triceps 20# farmer carry 10# overhead single arm carry, then 10# with both arms overhead 5# UE circuit 5# sit to stand with overhead press   PATIENT EDUCATION: Education details: POC Person educated: Patient Education method: Explanation Education comprehension: verbalized understanding  HOME EXERCISE PROGRAM: Walking, SLS, doing some exercises with bands  Access Code: [email or web address] URL: [email or web address] Date: 10/22/2023 Prepared by: Almetta Fam  Exercises - Single Leg Stance  - 1 x daily - 7 x weekly - 2 sets - 10 hold - Tandem Stance  - 1 x daily - 7 x weekly - 2 sets - 10 reps - Sit to Stand  - 1 x daily - 7 x weekly - 2 sets - 10 reps - Heel Raises with Counter Support  - 1 x daily - 7 x weekly - 2 sets - 10 reps - Heel Toe Raises with Counter Support  - 1 x daily - 7 x weekly - 2 sets - 10 reps  GOALS: Goals reviewed with patient? Yes  SHORT TERM GOALS: Target date: 11/27/23  Patient will be independent with initial HEP. Baseline:  Goal status: met with walking program 11/05/23  2.  Patient will improve R ankle DF to 20d Baseline: 10d Goal status:  met 12/03/23    LONG TERM GOALS: Target date: 06/25/24  Patient will be independent with advanced/ongoing HEP to improve outcomes and carryover.  Baseline:  Goal status: met 12/24/25progressing 01/21/24  2.  Patient will be able stand SLS on RLE >10s on  firm and foam surfaces Baseline: 5s at best on firm  Goal status: progressing 01/21/24, ongoing 02/03/24, RLE 4s LLE 8s 04/23/24, RLE 5s LLE 10s on firm 05/14/24, met 06/17/24  3.  Patient will be able to do at least 10 alternating taps on 6 in 20s  Baseline: 6 on 4 in 20s  Goal status: MET 11/19/23   4.  Patient will demonstrate 12 reps or more with 30s chair test Baseline: 9  Goal status: at 12 reps on 01/21/24, Met 02/03/24  5.  Patient will improve ABC confidence score to 75-80%  Baseline: 65%  Goal status: met 01/14/24  6. Patient will be able to do a light jog, small hops, and high level balance without LOB and fatigue  Goal status: ongoing 03/02/24, ongoing 04/08/24, 04/23/24, ongoing 05/14/24   ASSESSMENT:  CLINICAL IMPRESSION: Patient continues to do well. We will make this our last visit, we reviewed all safety and HEP, she feels good with this   EVAL Patient is a 48 y.o. female who was seen today for physical therapy evaluation and treatment for aftercare following brain surgery. Her surgery was 2 years ago but she began having complication recently and seizures. She still has decreased coordination with RLE, and unable to do equal movements with both sides. R ankle DF has returned significantly but still missing about 10d. Pt would like to increase her mobility and ability to do things with her RLE to feel more balance and coordinated. She will benefit from PT to work on her overall strength, balance, and coordination to improve her confidence out in the community.   OBJECTIVE IMPAIRMENTS: Abnormal gait, decreased balance, decreased coordination, and decreased ROM.   ACTIVITY LIMITATIONS: locomotion level  PARTICIPATION LIMITATIONS: community activity and yard work  PERSONAL FACTORS: Fitness, Past/current experiences, and Time since onset of injury/illness/exacerbation are also affecting patient's functional outcome.   REHAB POTENTIAL: Good  CLINICAL DECISION MAKING:  Stable/uncomplicated  EVALUATION COMPLEXITY: Low  PLAN:  PT FREQUENCY: 1x/week  PT DURATION: 12 weeks  PLANNED INTERVENTIONS: 97110-Therapeutic exercises, 97530- Therapeutic activity, [id number]- Neuromuscular re-education, 97535- Self Care, [id number]- Manual therapy, [phone]- Gait training, [phone]- Electrical stimulation (manual), [phone]- Ultrasound, Patient/Family education, Balance training, Stair training, Taping, Dry Needling, Joint mobilization, Spinal mobilization, Cryotherapy, and Moist heat  PLAN FOR NEXT SESSION: Will D/C goals met  OBADIAH OZELL ORN, PT 06/17/2024, 1:01 PM  "

## 2024-06-29 ENCOUNTER — Telehealth: Payer: Self-pay

## 2024-06-29 NOTE — Telephone Encounter (Signed)
 Pt called and states she received an email from [facility] stating she needed a Dr to fill out a form for qualifying condition. Pt was advised to send email to [email or web address] She verbalized thanks and understanding

## 2024-06-30 ENCOUNTER — Inpatient Hospital Stay: Attending: Hematology and Oncology | Admitting: Internal Medicine

## 2024-06-30 DIAGNOSIS — C7931 Secondary malignant neoplasm of brain: Secondary | ICD-10-CM

## 2024-06-30 DIAGNOSIS — R569 Unspecified convulsions: Secondary | ICD-10-CM | POA: Diagnosis not present

## 2024-06-30 MED ORDER — LEVETIRACETAM 1000 MG PO TABS: 1000.0000 mg | ORAL_TABLET | Freq: Two times a day (BID) | ORAL | 5 refills | Status: AC

## 2024-06-30 MED ORDER — DEXAMETHASONE 2 MG PO TABS: 2.0000 mg | ORAL_TABLET | Freq: Every day | ORAL | 0 refills | Status: AC

## 2024-06-30 NOTE — Progress Notes (Signed)
 I connected with Sheryl Mcmillan on 06/30/2024 at  9:30 AM EST by telephone visit and verified that I am speaking with the correct person using two identifiers.  I discussed the limitations, risks, security and privacy concerns of performing an evaluation and management service by telemedicine and the availability of in-person appointments. I also discussed with the patient that there may be a patient responsible charge related to this service. The patient expressed understanding and agreed to proceed.   Other persons participating in the visit and their role in the encounter:  n/a  Patient's location:  Home Provider's location:  Office Chief Complaint:  Metastatic cancer to brain [facility])  Seizure (HCC)  History of Present Ilness: Sheryl Mcmillan reports mild worsening of right leg function, weakness, since stopping the decadron .  She is still able to walk independently, but she describes feeling improved function while on the steroid.  It did increase her appetite but was otherwise well tolerated.  No other new or progressive changes.  Observations: Language and cognition at baseline   Assessment and Plan: Metastatic cancer to brain [facility])  Seizure (HCC)  Clinically improved while on steroids.  Agreeable with resuming decadron  2mg  daily x30 days, until the next brain MRI study.  She is ok with this.  Dose may be reduced to 1mg  daily if not well tolerated.  Will con't Keppra  1000mg  BID in the interim.  Follow Up Instructions: We ask that Sheryl Mcmillan return to clinic in 1 months following next brain MRI, or sooner as needed.  I discussed the assessment and treatment plan with the patient.  The patient was provided an opportunity to ask questions and all were answered.  The patient agreed with the plan and demonstrated understanding of the instructions.    The patient was advised to call back or seek an in-person evaluation if the symptoms worsen or if the condition fails to improve as  anticipated.    Averee Harb K Dakia Schifano, MD   I provided 20 minutes of non face-to-face telephone visit time during this encounter, and > 50% was spent counseling as documented under my assessment & plan.

## 2024-07-06 ENCOUNTER — Telehealth: Payer: Self-pay | Admitting: Internal Medicine

## 2024-07-06 NOTE — Telephone Encounter (Signed)
 Scheduled patient for a follow-up after MRI. Called and spoke with the patient, she is aware.

## 2024-07-08 ENCOUNTER — Telehealth: Payer: Self-pay | Admitting: *Deleted

## 2024-07-08 NOTE — Telephone Encounter (Signed)
 Notified that Dr Buckley says it is fine for her to go on the cruise.

## 2024-07-08 NOTE — Telephone Encounter (Signed)
 Aditi called to say she has been offered a free 13-day cruise to [place], [place], [place] and [place]. 07/11/24-07/24/24.  Wants to know if Dr Buckley is OK for her to go. MRI is scheduled for 07/27/24.

## 2024-07-27 ENCOUNTER — Other Ambulatory Visit

## 2024-08-04 ENCOUNTER — Inpatient Hospital Stay: Admitting: Internal Medicine

## 2024-08-04 ENCOUNTER — Other Ambulatory Visit

## 2024-08-06 ENCOUNTER — Inpatient Hospital Stay: Attending: Hematology and Oncology | Admitting: Internal Medicine

## 2024-08-26 ENCOUNTER — Other Ambulatory Visit

## 2024-11-25 ENCOUNTER — Ambulatory Visit: Admitting: Dermatology

## 2025-05-25 ENCOUNTER — Inpatient Hospital Stay

## 2025-06-01 ENCOUNTER — Inpatient Hospital Stay: Admitting: Hematology and Oncology
